# Patient Record
Sex: Female | Born: 1950
Health system: Southern US, Community
[De-identification: ages and names within clinical notes are randomized; demographics above are authoritative.]

## PROBLEM LIST (undated history)

## (undated) DIAGNOSIS — M2550 Pain in unspecified joint: Secondary | ICD-10-CM

## (undated) DIAGNOSIS — R7303 Prediabetes: Secondary | ICD-10-CM

## (undated) DIAGNOSIS — F32A Depression, unspecified: Secondary | ICD-10-CM

## (undated) DIAGNOSIS — M48061 Spinal stenosis, lumbar region without neurogenic claudication: Secondary | ICD-10-CM

## (undated) DIAGNOSIS — F329 Major depressive disorder, single episode, unspecified: Secondary | ICD-10-CM

## (undated) DIAGNOSIS — C8590 Non-Hodgkin lymphoma, unspecified, unspecified site: Secondary | ICD-10-CM

## (undated) DIAGNOSIS — F419 Anxiety disorder, unspecified: Secondary | ICD-10-CM

## (undated) DIAGNOSIS — R51 Headache: Secondary | ICD-10-CM

## (undated) DIAGNOSIS — Z87440 Personal history of urinary (tract) infections: Secondary | ICD-10-CM

## (undated) DIAGNOSIS — K219 Gastro-esophageal reflux disease without esophagitis: Secondary | ICD-10-CM

## (undated) DIAGNOSIS — J189 Pneumonia, unspecified organism: Secondary | ICD-10-CM

## (undated) DIAGNOSIS — K8020 Calculus of gallbladder without cholecystitis without obstruction: Secondary | ICD-10-CM

## (undated) DIAGNOSIS — R519 Headache, unspecified: Secondary | ICD-10-CM

## (undated) DIAGNOSIS — C801 Malignant (primary) neoplasm, unspecified: Secondary | ICD-10-CM

## (undated) DIAGNOSIS — C50912 Malignant neoplasm of unspecified site of left female breast: Principal | ICD-10-CM

## (undated) DIAGNOSIS — C50911 Malignant neoplasm of unspecified site of right female breast: Secondary | ICD-10-CM

## (undated) DIAGNOSIS — C210 Malignant neoplasm of anus, unspecified: Secondary | ICD-10-CM

## (undated) DIAGNOSIS — M1990 Unspecified osteoarthritis, unspecified site: Secondary | ICD-10-CM

## (undated) DIAGNOSIS — G629 Polyneuropathy, unspecified: Secondary | ICD-10-CM

## (undated) DIAGNOSIS — C50919 Malignant neoplasm of unspecified site of unspecified female breast: Secondary | ICD-10-CM

## (undated) DIAGNOSIS — E785 Hyperlipidemia, unspecified: Secondary | ICD-10-CM

## (undated) DIAGNOSIS — M2540 Effusion, unspecified joint: Secondary | ICD-10-CM

## (undated) DIAGNOSIS — I10 Essential (primary) hypertension: Secondary | ICD-10-CM

## (undated) DIAGNOSIS — Z8719 Personal history of other diseases of the digestive system: Secondary | ICD-10-CM

## (undated) DIAGNOSIS — Z8669 Personal history of other diseases of the nervous system and sense organs: Secondary | ICD-10-CM

## (undated) HISTORY — DX: Malignant neoplasm of unspecified site of left female breast: C50.912

## (undated) HISTORY — PX: OTHER SURGICAL HISTORY: SHX169

## (undated) HISTORY — DX: Polyneuropathy, unspecified: G62.9

## (undated) HISTORY — PX: HERNIA REPAIR: SHX51

## (undated) HISTORY — PX: TONSILLECTOMY: SUR1361

## (undated) HISTORY — PX: COLONOSCOPY: SHX174

## (undated) HISTORY — PX: ABDOMINAL HYSTERECTOMY: SHX81

## (undated) HISTORY — PX: CHOLECYSTECTOMY: SHX55

## (undated) HISTORY — DX: Malignant neoplasm of unspecified site of right female breast: C50.911

---

## 2012-06-29 ENCOUNTER — Encounter (HOSPITAL_COMMUNITY): Payer: Self-pay | Admitting: Emergency Medicine

## 2012-06-29 ENCOUNTER — Emergency Department (HOSPITAL_COMMUNITY)
Admission: EM | Admit: 2012-06-29 | Discharge: 2012-06-30 | Disposition: A | Discharge status: Home / Self Care | Payer: No Typology Code available for payment source | Attending: Emergency Medicine | Admitting: Emergency Medicine

## 2012-06-29 ENCOUNTER — Emergency Department (HOSPITAL_COMMUNITY): Payer: No Typology Code available for payment source

## 2012-06-29 DIAGNOSIS — Z79899 Other long term (current) drug therapy: Secondary | ICD-10-CM | POA: Insufficient documentation

## 2012-06-29 DIAGNOSIS — M479 Spondylosis, unspecified: Secondary | ICD-10-CM | POA: Insufficient documentation

## 2012-06-29 DIAGNOSIS — I10 Essential (primary) hypertension: Secondary | ICD-10-CM | POA: Insufficient documentation

## 2012-06-29 DIAGNOSIS — M25559 Pain in unspecified hip: Secondary | ICD-10-CM | POA: Insufficient documentation

## 2012-06-29 DIAGNOSIS — M549 Dorsalgia, unspecified: Secondary | ICD-10-CM

## 2012-06-29 HISTORY — DX: Essential (primary) hypertension: I10

## 2012-06-29 HISTORY — DX: Malignant (primary) neoplasm, unspecified: C80.1

## 2012-06-29 HISTORY — DX: Malignant neoplasm of unspecified site of unspecified female breast: C50.919

## 2012-06-29 HISTORY — DX: Spinal stenosis, lumbar region without neurogenic claudication: M48.061

## 2012-06-29 MED ORDER — METHOCARBAMOL 500 MG PO TABS
500.0000 mg | ORAL_TABLET | Freq: Once | ORAL | Status: AC
  Administered 2012-06-29: 500 mg via ORAL
  Filled 2012-06-29: qty 1

## 2012-06-29 MED ORDER — HYDROMORPHONE HCL PF 2 MG/ML IJ SOLN: 2.0000 mg | Freq: Once | INTRAMUSCULAR | Status: DC

## 2012-06-29 MED ORDER — HYDROMORPHONE HCL PF 2 MG/ML IJ SOLN
2.0000 mg | Freq: Once | INTRAMUSCULAR | Status: AC
  Administered 2012-06-29: 2 mg via INTRAMUSCULAR
  Filled 2012-06-29: qty 1

## 2012-06-29 MED ORDER — ONDANSETRON HCL 4 MG/2ML IJ SOLN: 4.0000 mg | Freq: Once | INTRAMUSCULAR | Status: DC

## 2012-06-29 MED ORDER — IBUPROFEN 200 MG PO TABS
600.0000 mg | ORAL_TABLET | Freq: Once | ORAL | Status: AC
  Administered 2012-06-29: 600 mg via ORAL
  Filled 2012-06-29: qty 3

## 2012-06-29 MED ORDER — RANITIDINE HCL 150 MG/10ML PO SYRP
150.0000 mg | ORAL_SOLUTION | Freq: Once | ORAL | Status: AC
  Administered 2012-06-29: 150 mg via ORAL
  Filled 2012-06-29: qty 10

## 2012-06-29 NOTE — ED Notes (Signed)
Pt reports lower back pain that radiates to pelvis and bilat LE, pt admits to hx bulging disc - pt states she has been taking medications at home w/o relief and pain has gotten progressively worse x1 week - pt visiting family and is from [place] so has been unable to see her PCP for this. Pt A&Ox4 in no acute distress, pleasant and cooperative.

## 2012-06-29 NOTE — ED Notes (Signed)
Pt states she is having pain in her lower back for the past couple of months  Pt states she has been treating herself at home with ibuprofen, aleve, and meloxicam everyday alternating the medications  Pt states it is not helping  Pt states she now has tingling in her legs when she lays down  Pt states it hurts to lay on her back or her side, to stand or sit for any length of time  Pt states she has pain in her groin area bilaterally

## 2012-06-30 MED ORDER — RANITIDINE HCL 150 MG PO CAPS: 150.0000 mg | ORAL_CAPSULE | Freq: Every day | ORAL | Status: DC

## 2012-06-30 MED ORDER — DIPHENHYDRAMINE HCL 25 MG PO CAPS
25.0000 mg | ORAL_CAPSULE | Freq: Once | ORAL | Status: AC
  Administered 2012-06-30: 25 mg via ORAL

## 2012-06-30 MED ORDER — DIPHENHYDRAMINE HCL 25 MG PO CAPS
ORAL_CAPSULE | ORAL | Status: AC
  Administered 2012-06-30: 25 mg via ORAL
  Filled 2012-06-30: qty 2

## 2012-06-30 MED ORDER — OXYCODONE-ACETAMINOPHEN 5-325 MG PO TABS: 1.0000 | ORAL_TABLET | Freq: Four times a day (QID) | ORAL | Status: DC | PRN

## 2012-06-30 MED ORDER — IBUPROFEN 600 MG PO TABS: 600.0000 mg | ORAL_TABLET | Freq: Four times a day (QID) | ORAL | Status: DC | PRN

## 2012-06-30 MED ORDER — METHOCARBAMOL 500 MG PO TABS: 500.0000 mg | ORAL_TABLET | Freq: Two times a day (BID) | ORAL | Status: DC

## 2012-06-30 MED ORDER — DIPHENHYDRAMINE HCL 25 MG PO CAPS: 25.0000 mg | ORAL_CAPSULE | Freq: Four times a day (QID) | ORAL | Status: DC | PRN

## 2012-06-30 NOTE — ED Notes (Signed)
Pt c/o generalized urticaria - Dr. Rhunette Croft made aware, orders given for PO benadryl.

## 2012-06-30 NOTE — ED Notes (Signed)
Rx given x5 D/c instructions reviewed w/ pt and family - pt and family deny any further questions or concerns at present. Pt assisted to d/c via wheelchair - pt A&Ox4 in no acute distress

## 2012-06-30 NOTE — ED Provider Notes (Signed)
History     CSN: [id number]  Arrival date & time 06/29/12  2009   First MD Initiated Contact with Patient 06/29/12 2130      Chief Complaint  Patient presents with  . Back Pain    (Consider location/radiation/quality/duration/timing/severity/associated sxs/prior treatment) HPI Comments: Pt comes in with cc of back pain. She has hx of chronic back pain, which is gradually worsening over the past 2 months. The pain is located in the lumbar region, and is radiating down her hips. She has associated numbness in the legs as well.  Pt has no associated numbness, weakness, urinary incontinence, urinary retention, bowel incontinence, saddle anesthesia.   Patient is a 61 y.o. female presenting with back pain. The history is provided by the patient.  Back Pain  Pertinent negatives include no chest pain, no headaches, no abdominal pain and no dysuria.    Past Medical History  Diagnosis Date  . Cancer   . Breast cancer   . Hypertension   . Lumbar stenosis     Past Surgical History  Procedure Date  . Double mastectomy    . Hernia repair   . Tonsillectomy   . Abdominal hysterectomy     Family History  Problem Relation Age of Onset  . Adopted: Yes    History  Substance Use Topics  . Smoking status: Never Smoker   . Smokeless tobacco: Not on file  . Alcohol Use: No    OB History    Grav Para Term Preterm Abortions TAB SAB Ect Mult Living                  Review of Systems  Constitutional: Positive for activity change.  HENT: Negative for neck pain.   Respiratory: Negative for shortness of breath.   Cardiovascular: Negative for chest pain.  Gastrointestinal: Negative for nausea, vomiting and abdominal pain.  Genitourinary: Negative for dysuria.  Musculoskeletal: Positive for back pain and gait problem.  Neurological: Negative for headaches.    Allergies  Vicodin  Home Medications   Current Outpatient Rx  Name Route Sig Dispense Refill  . IBUPROFEN 200 MG  PO TABS Oral Take 800 mg by mouth every 8 (eight) hours as needed. Pain    . LOSARTAN POTASSIUM 25 MG PO TABS Oral Take 12.5 mg by mouth daily.    . MELOXICAM 7.5 MG PO TABS Oral Take 7.5 mg by mouth daily.    [name] Kitchen NAPROXEN SODIUM 220 MG PO TABS Oral Take 880 mg by mouth 2 (two) times daily with a meal.    . TAMOXIFEN CITRATE 10 MG PO TABS Oral Take 10 mg by mouth daily.    [name] Kitchen DIPHENHYDRAMINE HCL 25 MG PO CAPS Oral Take 1 capsule (25 mg total) by mouth every 6 (six) hours as needed for itching. 30 capsule 0  . IBUPROFEN 600 MG PO TABS Oral Take 1 tablet (600 mg total) by mouth every 6 (six) hours as needed for pain. 30 tablet 0  . METHOCARBAMOL 500 MG PO TABS Oral Take 1 tablet (500 mg total) by mouth 2 (two) times daily. 20 tablet 0  . OXYCODONE-ACETAMINOPHEN 5-325 MG PO TABS Oral Take 1 tablet by mouth every 6 (six) hours as needed for pain. 20 tablet 0  . RANITIDINE HCL 150 MG PO CAPS Oral Take 1 capsule (150 mg total) by mouth daily. 30 capsule 0    BP 132/96  Pulse 58  Temp 98.2 F (36.8 C) (Oral)  Resp 20  SpO2 94%  Physical Exam  Nursing note and vitals reviewed. Constitutional: She is oriented to person, place, and time. She appears well-developed and well-nourished.  HENT:  Head: Normocephalic and atraumatic.  Eyes: EOM are normal. Pupils are equal, round, and reactive to light.  Neck: Neck supple.  Cardiovascular: Normal rate, regular rhythm and normal heart sounds.   No murmur heard. Pulmonary/Chest: Effort normal. No respiratory distress.  Abdominal: Soft. She exhibits no distension. There is no tenderness. There is no rebound and no guarding.  Musculoskeletal:       Reproducible tenderness with palpation of the lumbar spine and the surrounding muscles.  Neurological: She is alert and oriented to person, place, and time.  Skin: Skin is warm and dry.    ED Course  Procedures (including critical care time)  Labs Reviewed - No data to display Dg Lumbar Spine  Complete  06/29/2012  *RADIOLOGY REPORT*  Clinical Data: Low back pain for 2 months.  Radiating into lower extremities.  No injury.  LUMBAR SPINE - COMPLETE 4+ VIEW  Comparison: None.  Findings:   Mild convex right lumbar spine curvature. Cholecystectomy clips. Five lumbar type vertebral bodies. Sacroiliac joints are symmetric.  Left greater than right hip osteoarthritis.  Moderate to severe on the left.  Maintenance of vertebral body height.  Mild degenerative disc disease at the lumbosacral junction.  IMPRESSION: Spinal curvature, with degenerative disc disease at the lumbosacral junction.  Moderate to severe left and moderate right hip osteoarthritis.   Original Report Authenticated By: Consuello Bossier, M.D.      1. Back pain   2. Degenerative joint disease of low back       MDM  DDx includes: - DJD of the back - Spondylitises/ spondylosis - Sciatica - Spinal cord compression - Conus medullaris - Epidural hematoma - Epidural abscess - Lytic/pathologic fracture - Myelitis - Musculoskeletal pain  Pt comes in with cc of back pain. Has hx of chronic back pain. She is visiting her sister, and been here for 3 months. Over the past few days the pain has gradually increased, now to the point where ti is affecting her routine. No redflags for spinal cord involvement, infection, lytic lesions. Will control sx, and get back films.   12:21 AM Pain improved drastically with combination of meds here. Will d/c soon.  Derwood Kaplan, MD 06/30/12 0021

## 2013-01-25 ENCOUNTER — Other Ambulatory Visit: Payer: Self-pay | Admitting: Family Medicine

## 2013-01-25 DIAGNOSIS — N644 Mastodynia: Secondary | ICD-10-CM

## 2013-02-02 ENCOUNTER — Telehealth: Payer: Self-pay | Admitting: *Deleted

## 2013-02-02 NOTE — Telephone Encounter (Signed)
Left message for pt to return my call so I can schedule her w/ a med onc. 

## 2013-02-04 ENCOUNTER — Other Ambulatory Visit: Payer: Self-pay | Admitting: Orthopedic Surgery

## 2013-02-05 ENCOUNTER — Encounter (HOSPITAL_COMMUNITY): Payer: Self-pay | Admitting: Pharmacy Technician

## 2013-02-10 NOTE — H&P (Signed)
TOTAL HIP ADMISSION H&P  Patient is admitted for left total hip arthroplasty.  Subjective:  Chief Complaint: left hip pain  HPI: Cynthia Velasquez, 62 y.o. female, has a history of pain and functional disability in the left hip(s) due to avascular necrosis and patient has failed non-surgical conservative treatments for greater than 12 weeks to include NSAID's and/or analgesics, corticosteriod injections and use of assistive devices.  Onset of symptoms was abrupt starting 1 years ago with rapidlly worsening course since that time.The patient noted no past surgery on the left hip(s).  Patient currently rates pain in the left hip at 9 out of 10 with activity. Patient has night pain and worsening of pain with activity and weight bearing. Patient has evidence of subchondral cysts, subchondral sclerosis and partial collapse of the femoral head by imaging studies. This condition presents safety issues increasing the risk of falls. This patient has had avascular necrosis of the hip, acetabular fracture, hip dysplasia.  There is no current active infection.  There are no active problems to display for this patient.  Past Medical History  Diagnosis Date  . Cancer   . Breast cancer   . Hypertension   . Lumbar stenosis     Past Surgical History  Procedure Laterality Date  . Double mastectomy     . Hernia repair    . Tonsillectomy    . Abdominal hysterectomy      No prescriptions prior to admission   Allergies  Allergen Reactions  . Codeine Itching and Rash  . Vicodin (Hydrocodone-Acetaminophen) Rash    History  Substance Use Topics  . Smoking status: Never Smoker   . Smokeless tobacco: Not on file  . Alcohol Use: No    Family History  Problem Relation Age of Onset  . Adopted: Yes     Review of Systems  Constitutional: Negative.   HENT: Negative.   Eyes: Negative.   Respiratory: Negative.   Cardiovascular: Negative.   Gastrointestinal: Negative.   Genitourinary: Negative.    Musculoskeletal: Positive for back pain and joint pain.  Skin: Negative.   Neurological: Negative.   Endo/Heme/Allergies: Negative.   Psychiatric/Behavioral: Negative.     Objective:  Physical Exam  Constitutional: She is oriented to person, place, and time. She appears well-developed and well-nourished.  HENT:  Head: Normocephalic.  Eyes: Pupils are equal, round, and reactive to light.  Cardiovascular: Intact distal pulses.   Respiratory: Effort normal.  Musculoskeletal:       Left hip: She exhibits decreased range of motion, tenderness and crepitus.  Neurological: She is alert and oriented to person, place, and time.  Psychiatric: She has a normal mood and affect.    Vital signs in last 24 hours:    Labs:   There is no height or weight on file to calculate BMI.   Imaging Review Plain radiographs demonstrate severe degenerative joint disease of the left hip(s). The bone quality appears to be adequate for age and reported activity level.  Assessment/Plan:  End stage arthritis, left hip(s)  The patient history, physical examination, clinical judgement of the provider and imaging studies are consistent with end stage degenerative joint disease of the left hip(s) and total hip arthroplasty is deemed medically necessary. The treatment options including medical management, injection therapy, arthroscopy and arthroplasty were discussed at length. The risks and benefits of total hip arthroplasty were presented and reviewed. The risks due to aseptic loosening, infection, stiffness, dislocation/subluxation,  thromboembolic complications and other imponderables were discussed.  The patient acknowledged  the explanation, agreed to proceed with the plan and consent was signed. Patient is being admitted for inpatient treatment for surgery, pain control, PT, OT, prophylactic antibiotics, VTE prophylaxis, progressive ambulation and ADL's and discharge planning.The patient is planning to be  discharged home with home health services

## 2013-02-11 ENCOUNTER — Ambulatory Visit (HOSPITAL_COMMUNITY)
Admission: RE | Admit: 2013-02-11 | Discharge: 2013-02-11 | Disposition: A | Discharge status: Home / Self Care | Payer: BC Managed Care – PPO | Source: Ambulatory Visit | Attending: Orthopedic Surgery | Admitting: Orthopedic Surgery

## 2013-02-11 ENCOUNTER — Encounter (HOSPITAL_COMMUNITY)
Admission: RE | Admit: 2013-02-11 | Discharge: 2013-02-11 | Disposition: A | Discharge status: Home / Self Care | Payer: BC Managed Care – PPO | Source: Ambulatory Visit | Attending: Orthopedic Surgery | Admitting: Orthopedic Surgery

## 2013-02-11 ENCOUNTER — Encounter (HOSPITAL_COMMUNITY): Payer: Self-pay

## 2013-02-11 HISTORY — DX: Gastro-esophageal reflux disease without esophagitis: K21.9

## 2013-02-11 HISTORY — DX: Personal history of other diseases of the nervous system and sense organs: Z86.69

## 2013-02-11 HISTORY — DX: Hyperlipidemia, unspecified: E78.5

## 2013-02-11 HISTORY — DX: Unspecified osteoarthritis, unspecified site: M19.90

## 2013-02-11 HISTORY — DX: Pain in unspecified joint: M25.50

## 2013-02-11 HISTORY — DX: Personal history of urinary (tract) infections: Z87.440

## 2013-02-11 HISTORY — DX: Effusion, unspecified joint: M25.40

## 2013-02-11 LAB — CBC WITH DIFFERENTIAL/PLATELET
Basophils Absolute: 0 10*3/uL (ref 0.0–0.1)
Basophils Relative: 0 % (ref 0–1)
Eosinophils Absolute: 0.1 10*3/uL (ref 0.0–0.7)
Eosinophils Relative: 2 % (ref 0–5)
MCH: 26.5 pg (ref 26.0–34.0)
MCHC: 32.7 g/dL (ref 30.0–36.0)
MCV: 81.1 fL (ref 78.0–100.0)
Platelets: 245 10*3/uL (ref 150–400)
RDW: 15.3 % (ref 11.5–15.5)
WBC: 7.5 10*3/uL (ref 4.0–10.5)

## 2013-02-11 LAB — URINE MICROSCOPIC-ADD ON

## 2013-02-11 LAB — BASIC METABOLIC PANEL
BUN: 15 mg/dL (ref 6–23)
CO2: 26 mEq/L (ref 19–32)
Calcium: 9.8 mg/dL (ref 8.4–10.5)
Creatinine, Ser: 0.83 mg/dL (ref 0.50–1.10)
Glucose, Bld: 96 mg/dL (ref 70–99)

## 2013-02-11 LAB — URINALYSIS, ROUTINE W REFLEX MICROSCOPIC
Bilirubin Urine: NEGATIVE
Nitrite: POSITIVE — AB
Specific Gravity, Urine: 1.024 (ref 1.005–1.030)
pH: 6 (ref 5.0–8.0)

## 2013-02-11 LAB — PROTIME-INR: Prothrombin Time: 13.4 seconds (ref 11.6–15.2)

## 2013-02-11 LAB — APTT: aPTT: 34 seconds (ref 24–37)

## 2013-02-11 LAB — ABO/RH: ABO/RH(D): O POS

## 2013-02-11 MED ORDER — CHLORHEXIDINE GLUCONATE 4 % EX LIQD: 60.0000 mL | Freq: Once | CUTANEOUS | Status: DC

## 2013-02-11 MED ORDER — CEFAZOLIN SODIUM-DEXTROSE 2-3 GM-% IV SOLR
2.0000 g | INTRAVENOUS | Status: AC
Start: 2013-02-12 — End: 2013-02-12
  Administered 2013-02-12: 2 g via INTRAVENOUS
  Filled 2013-02-11: qty 50

## 2013-02-11 NOTE — Pre-Procedure Instructions (Signed)
Cynthia Velasquez  02/11/2013   Your procedure is scheduled on:  Fri, May 30 @ 11:55 AM  Report to [facility] at 9:45 AM   Call this number if you have problems the morning of surgery: [phone]   Remember:   Do not eat food or drink liquids after midnight.   Take these medicines the morning of surgery with A SIP OF WATER: Tamoxifen(Nolvadex)              No Aspirin,Goody's,BC's,Aleve,Ibuprofen,Fish Oil,or any Herbal Medications   Do not wear jewelry, make-up or nail polish.  Do not wear lotions, powders, or perfumes. You may wear deodorant.  Do not shave 48 hours prior to surgery.   Do not bring valuables to the hospital.  Contacts, dentures or bridgework may not be worn into surgery.  Leave suitcase in the car. After surgery it may be brought to your room.  For patients admitted to the hospital, checkout time is 11:00 AM the day of  discharge.   Patients discharged the day of surgery will not be allowed to drive  home.    Special Instructions: Shower using CHG 2 nights before surgery and the night before surgery.  If you shower the day of surgery use CHG.  Use special wash - you have one bottle of CHG for all showers.  You should use approximately 1/3 of the bottle for each shower.   Please read over the following fact sheets that you were given: Pain Booklet, Coughing and Deep Breathing, Blood Transfusion Information, Total Joint Packet, MRSA Information and Surgical Site Infection Prevention

## 2013-02-11 NOTE — Progress Notes (Signed)
Labs and PCR still pending at 1915;  please review  On DOS. Also, please review EKG on DOS.

## 2013-02-11 NOTE — Progress Notes (Signed)
Pt doesn't have a cardiologist  Stress test done 20+yrs ago and was done d/t stressful job  Denies ever having an echo or heart cath  [employer] with Deboraha Sprang  Denies ekg or cxr in past yr

## 2013-02-12 ENCOUNTER — Ambulatory Visit (HOSPITAL_COMMUNITY): Payer: BC Managed Care – PPO | Admitting: Anesthesiology

## 2013-02-12 ENCOUNTER — Encounter (HOSPITAL_COMMUNITY): Payer: Self-pay | Admitting: Anesthesiology

## 2013-02-12 ENCOUNTER — Encounter (HOSPITAL_COMMUNITY)
Admission: RE | Disposition: A | Discharge status: Home / Self Care | Payer: Self-pay | Source: Ambulatory Visit | Attending: Orthopedic Surgery

## 2013-02-12 ENCOUNTER — Inpatient Hospital Stay (HOSPITAL_COMMUNITY)
Admission: RE | Admit: 2013-02-12 | Discharge: 2013-02-15 | DRG: 818 | Disposition: A | Discharge status: Home / Self Care | Payer: BC Managed Care – PPO | Source: Ambulatory Visit | Attending: Orthopedic Surgery | Admitting: Orthopedic Surgery

## 2013-02-12 ENCOUNTER — Inpatient Hospital Stay (HOSPITAL_COMMUNITY): Payer: BC Managed Care – PPO

## 2013-02-12 DIAGNOSIS — M1610 Unilateral primary osteoarthritis, unspecified hip: Principal | ICD-10-CM | POA: Diagnosis present

## 2013-02-12 DIAGNOSIS — E785 Hyperlipidemia, unspecified: Secondary | ICD-10-CM | POA: Diagnosis present

## 2013-02-12 DIAGNOSIS — M1612 Unilateral primary osteoarthritis, left hip: Secondary | ICD-10-CM

## 2013-02-12 DIAGNOSIS — Z87440 Personal history of urinary (tract) infections: Secondary | ICD-10-CM

## 2013-02-12 DIAGNOSIS — K219 Gastro-esophageal reflux disease without esophagitis: Secondary | ICD-10-CM | POA: Diagnosis present

## 2013-02-12 DIAGNOSIS — Z7982 Long term (current) use of aspirin: Secondary | ICD-10-CM

## 2013-02-12 DIAGNOSIS — M169 Osteoarthritis of hip, unspecified: Principal | ICD-10-CM | POA: Diagnosis present

## 2013-02-12 DIAGNOSIS — Z853 Personal history of malignant neoplasm of breast: Secondary | ICD-10-CM

## 2013-02-12 DIAGNOSIS — Z9010 Acquired absence of unspecified breast and nipple: Secondary | ICD-10-CM

## 2013-02-12 DIAGNOSIS — I10 Essential (primary) hypertension: Secondary | ICD-10-CM | POA: Diagnosis present

## 2013-02-12 HISTORY — PX: TOTAL HIP ARTHROPLASTY: SHX124

## 2013-02-12 SURGERY — ARTHROPLASTY, HIP, TOTAL,POSTERIOR APPROACH
Anesthesia: General | Site: Hip | Laterality: Left | Wound class: Clean

## 2013-02-12 MED ORDER — DIPHENHYDRAMINE HCL 12.5 MG/5ML PO ELIX: 12.5000 mg | ORAL_SOLUTION | ORAL | Status: DC | PRN

## 2013-02-12 MED ORDER — MIDAZOLAM HCL 5 MG/5ML IJ SOLN
INTRAMUSCULAR | Status: DC | PRN
  Administered 2013-02-12 (×2): 1 mg via INTRAVENOUS

## 2013-02-12 MED ORDER — HYDROMORPHONE HCL PF 1 MG/ML IJ SOLN
INTRAMUSCULAR | Status: AC
  Filled 2013-02-12: qty 2

## 2013-02-12 MED ORDER — ALUM & MAG HYDROXIDE-SIMETH 200-200-20 MG/5ML PO SUSP: 30.0000 mL | ORAL | Status: DC | PRN

## 2013-02-12 MED ORDER — CELECOXIB 200 MG PO CAPS
200.0000 mg | ORAL_CAPSULE | Freq: Two times a day (BID) | ORAL | Status: DC
  Administered 2013-02-12 – 2013-02-15 (×5): 200 mg via ORAL
  Filled 2013-02-12 (×8): qty 1

## 2013-02-12 MED ORDER — METHOCARBAMOL 500 MG PO TABS
500.0000 mg | ORAL_TABLET | Freq: Four times a day (QID) | ORAL | Status: DC | PRN
  Administered 2013-02-12 – 2013-02-15 (×7): 500 mg via ORAL
  Filled 2013-02-12 (×7): qty 1

## 2013-02-12 MED ORDER — HYDROMORPHONE HCL PF 1 MG/ML IJ SOLN
1.0000 mg | INTRAMUSCULAR | Status: DC | PRN
  Administered 2013-02-12 – 2013-02-14 (×2): 1 mg via INTRAVENOUS
  Filled 2013-02-12 (×2): qty 1

## 2013-02-12 MED ORDER — GLYCOPYRROLATE 0.2 MG/ML IJ SOLN
INTRAMUSCULAR | Status: DC | PRN
  Administered 2013-02-12: .8 mg via INTRAVENOUS

## 2013-02-12 MED ORDER — SIMVASTATIN 20 MG PO TABS
20.0000 mg | ORAL_TABLET | Freq: Every day | ORAL | Status: DC
  Administered 2013-02-12 – 2013-02-15 (×4): 20 mg via ORAL
  Filled 2013-02-12 (×4): qty 1

## 2013-02-12 MED ORDER — MENTHOL 3 MG MT LOZG: 1.0000 | LOZENGE | OROMUCOSAL | Status: DC | PRN

## 2013-02-12 MED ORDER — DIPHENHYDRAMINE HCL 25 MG PO TABS
25.0000 mg | ORAL_TABLET | Freq: Every day | ORAL | Status: DC | PRN
  Filled 2013-02-12: qty 1

## 2013-02-12 MED ORDER — PHENYLEPHRINE HCL 10 MG/ML IJ SOLN
INTRAMUSCULAR | Status: DC | PRN
  Administered 2013-02-12 (×2): 80 ug via INTRAVENOUS

## 2013-02-12 MED ORDER — ONDANSETRON HCL 4 MG/2ML IJ SOLN
4.0000 mg | Freq: Four times a day (QID) | INTRAMUSCULAR | Status: DC | PRN
  Filled 2013-02-12: qty 2

## 2013-02-12 MED ORDER — PROPOFOL 10 MG/ML IV BOLUS
INTRAVENOUS | Status: DC | PRN
  Administered 2013-02-12: 150 mg via INTRAVENOUS
  Administered 2013-02-12: 50 mg via INTRAVENOUS

## 2013-02-12 MED ORDER — ARTIFICIAL TEARS OP OINT
TOPICAL_OINTMENT | OPHTHALMIC | Status: DC | PRN
  Administered 2013-02-12: 1 via OPHTHALMIC

## 2013-02-12 MED ORDER — ONDANSETRON HCL 4 MG/2ML IJ SOLN
INTRAMUSCULAR | Status: DC | PRN
  Administered 2013-02-12: 4 mg via INTRAVENOUS

## 2013-02-12 MED ORDER — ROCURONIUM BROMIDE 100 MG/10ML IV SOLN
INTRAVENOUS | Status: DC | PRN
  Administered 2013-02-12: 10 mg via INTRAVENOUS
  Administered 2013-02-12: 50 mg via INTRAVENOUS

## 2013-02-12 MED ORDER — DEXTROSE 5 % IV SOLN
500.0000 mg | Freq: Four times a day (QID) | INTRAVENOUS | Status: DC | PRN
  Filled 2013-02-12: qty 5

## 2013-02-12 MED ORDER — DEXTROSE-NACL 5-0.45 % IV SOLN: INTRAVENOUS | Status: DC

## 2013-02-12 MED ORDER — TAMOXIFEN CITRATE 10 MG PO TABS
20.0000 mg | ORAL_TABLET | Freq: Every day | ORAL | Status: DC
  Administered 2013-02-12 – 2013-02-15 (×4): 20 mg via ORAL
  Filled 2013-02-12 (×4): qty 2

## 2013-02-12 MED ORDER — FENTANYL CITRATE 0.05 MG/ML IJ SOLN
INTRAMUSCULAR | Status: DC | PRN
  Administered 2013-02-12 (×8): 50 ug via INTRAVENOUS
  Administered 2013-02-12: 100 ug via INTRAVENOUS

## 2013-02-12 MED ORDER — HYDROMORPHONE HCL PF 1 MG/ML IJ SOLN
0.2500 mg | INTRAMUSCULAR | Status: DC | PRN
  Administered 2013-02-12 (×4): 0.5 mg via INTRAVENOUS

## 2013-02-12 MED ORDER — FLEET ENEMA 7-19 GM/118ML RE ENEM: 1.0000 | ENEMA | Freq: Once | RECTAL | Status: AC | PRN

## 2013-02-12 MED ORDER — MAGNESIUM HYDROXIDE 400 MG/5ML PO SUSP: 30.0000 mL | Freq: Every day | ORAL | Status: DC | PRN

## 2013-02-12 MED ORDER — BISACODYL 5 MG PO TBEC: 5.0000 mg | DELAYED_RELEASE_TABLET | Freq: Every day | ORAL | Status: DC | PRN

## 2013-02-12 MED ORDER — ESMOLOL HCL 10 MG/ML IV SOLN
INTRAVENOUS | Status: DC | PRN
  Administered 2013-02-12 (×2): 20 mg via INTRAVENOUS

## 2013-02-12 MED ORDER — ASPIRIN EC 325 MG PO TBEC
325.0000 mg | DELAYED_RELEASE_TABLET | Freq: Two times a day (BID) | ORAL | Status: DC
  Administered 2013-02-13 – 2013-02-15 (×5): 325 mg via ORAL
  Filled 2013-02-12 (×8): qty 1

## 2013-02-12 MED ORDER — OXYCODONE HCL 5 MG PO TABS
5.0000 mg | ORAL_TABLET | ORAL | Status: DC | PRN
  Administered 2013-02-12 – 2013-02-15 (×6): 10 mg via ORAL
  Filled 2013-02-12 (×6): qty 2

## 2013-02-12 MED ORDER — LACTATED RINGERS IV SOLN
INTRAVENOUS | Status: DC | PRN
  Administered 2013-02-12 (×2): via INTRAVENOUS

## 2013-02-12 MED ORDER — LOSARTAN POTASSIUM-HCTZ 50-12.5 MG PO TABS: 1.0000 | ORAL_TABLET | Freq: Every day | ORAL | Status: DC

## 2013-02-12 MED ORDER — BUPIVACAINE-EPINEPHRINE PF 0.5-1:200000 % IJ SOLN
INTRAMUSCULAR | Status: AC
  Filled 2013-02-12: qty 30

## 2013-02-12 MED ORDER — METOCLOPRAMIDE HCL 10 MG PO TABS: 5.0000 mg | ORAL_TABLET | Freq: Three times a day (TID) | ORAL | Status: DC | PRN

## 2013-02-12 MED ORDER — LOSARTAN POTASSIUM 50 MG PO TABS
50.0000 mg | ORAL_TABLET | Freq: Every day | ORAL | Status: DC
  Administered 2013-02-12 – 2013-02-13 (×2): 50 mg via ORAL
  Filled 2013-02-12 (×2): qty 1

## 2013-02-12 MED ORDER — LACTATED RINGERS IV SOLN
INTRAVENOUS | Status: DC
  Administered 2013-02-12: 11:00:00 via INTRAVENOUS

## 2013-02-12 MED ORDER — SODIUM CHLORIDE 0.9 % IR SOLN
Status: DC | PRN
  Administered 2013-02-12: 1000 mL

## 2013-02-12 MED ORDER — MUPIROCIN 2 % EX OINT
TOPICAL_OINTMENT | CUTANEOUS | Status: AC
  Filled 2013-02-12: qty 22

## 2013-02-12 MED ORDER — ACETAMINOPHEN 650 MG RE SUPP: 650.0000 mg | Freq: Four times a day (QID) | RECTAL | Status: DC | PRN

## 2013-02-12 MED ORDER — PROMETHAZINE HCL 25 MG/ML IJ SOLN: 6.2500 mg | INTRAMUSCULAR | Status: DC | PRN

## 2013-02-12 MED ORDER — NEOSTIGMINE METHYLSULFATE 1 MG/ML IJ SOLN
INTRAMUSCULAR | Status: DC | PRN
  Administered 2013-02-12: 5 mg via INTRAVENOUS

## 2013-02-12 MED ORDER — LIDOCAINE HCL (CARDIAC) 20 MG/ML IV SOLN
INTRAVENOUS | Status: DC | PRN
  Administered 2013-02-12: 80 mg via INTRAVENOUS

## 2013-02-12 MED ORDER — ACETAMINOPHEN 325 MG PO TABS: 650.0000 mg | ORAL_TABLET | Freq: Four times a day (QID) | ORAL | Status: DC | PRN

## 2013-02-12 MED ORDER — METOCLOPRAMIDE HCL 5 MG/ML IJ SOLN: 5.0000 mg | Freq: Three times a day (TID) | INTRAMUSCULAR | Status: DC | PRN

## 2013-02-12 MED ORDER — KCL IN DEXTROSE-NACL 20-5-0.45 MEQ/L-%-% IV SOLN
INTRAVENOUS | Status: DC
Start: 2013-02-12 — End: 2013-02-15
  Administered 2013-02-12 – 2013-02-13 (×2): via INTRAVENOUS
  Administered 2013-02-14: 125 mL/h via INTRAVENOUS
  Administered 2013-02-15: 01:00:00 via INTRAVENOUS
  Filled 2013-02-12 (×12): qty 1000

## 2013-02-12 MED ORDER — HYDROCHLOROTHIAZIDE 12.5 MG PO CAPS
12.5000 mg | ORAL_CAPSULE | Freq: Every day | ORAL | Status: DC
  Administered 2013-02-12 – 2013-02-13 (×2): 12.5 mg via ORAL
  Filled 2013-02-12 (×2): qty 1

## 2013-02-12 MED ORDER — BUPIVACAINE-EPINEPHRINE 0.5% -1:200000 IJ SOLN
INTRAMUSCULAR | Status: DC | PRN
  Administered 2013-02-12: 30 mL

## 2013-02-12 MED ORDER — ONDANSETRON HCL 4 MG PO TABS: 4.0000 mg | ORAL_TABLET | Freq: Four times a day (QID) | ORAL | Status: DC | PRN

## 2013-02-12 MED ORDER — PHENOL 1.4 % MT LIQD
1.0000 | OROMUCOSAL | Status: DC | PRN
  Filled 2013-02-12: qty 177

## 2013-02-12 SURGICAL SUPPLY — 53 items
BLADE SAW SAG 73X25 THK (BLADE) ×1
BLADE SAW SGTL 18X1.27X75 (BLADE) IMPLANT
BLADE SAW SGTL 73X25 THK (BLADE) ×1 IMPLANT
BLADE SAW SGTL MED 73X18.5 STR (BLADE) IMPLANT
BRUSH FEMORAL CANAL (MISCELLANEOUS) IMPLANT
CLOTH BEACON ORANGE TIMEOUT ST (SAFETY) ×2 IMPLANT
COVER BACK TABLE 24X17X13 BIG (DRAPES) IMPLANT
COVER SURGICAL LIGHT HANDLE (MISCELLANEOUS) ×4 IMPLANT
DRAPE ORTHO SPLIT 77X108 STRL (DRAPES) ×1
DRAPE PROXIMA HALF (DRAPES) ×2 IMPLANT
DRAPE SURG ORHT 6 SPLT 77X108 (DRAPES) ×1 IMPLANT
DRAPE U-SHAPE 47X51 STRL (DRAPES) ×2 IMPLANT
DRILL BIT 7/64X5 (BIT) ×2 IMPLANT
DRSG MEPILEX BORDER 4X12 (GAUZE/BANDAGES/DRESSINGS) ×2 IMPLANT
DRSG MEPILEX BORDER 4X8 (GAUZE/BANDAGES/DRESSINGS) ×2 IMPLANT
DURAPREP 26ML APPLICATOR (WOUND CARE) ×2 IMPLANT
ELECT BLADE 4.0 EZ CLEAN MEGAD (MISCELLANEOUS)
ELECT REM PT RETURN 9FT ADLT (ELECTROSURGICAL) ×2
ELECTRODE BLDE 4.0 EZ CLN MEGD (MISCELLANEOUS) IMPLANT
ELECTRODE REM PT RTRN 9FT ADLT (ELECTROSURGICAL) ×1 IMPLANT
GAUZE XEROFORM 1X8 LF (GAUZE/BANDAGES/DRESSINGS) ×2 IMPLANT
GLOVE BIO SURGEON STRL SZ7 (GLOVE) ×2 IMPLANT
GLOVE BIO SURGEON STRL SZ7.5 (GLOVE) ×2 IMPLANT
GLOVE BIOGEL PI IND STRL 7.0 (GLOVE) ×1 IMPLANT
GLOVE BIOGEL PI IND STRL 8 (GLOVE) ×1 IMPLANT
GLOVE BIOGEL PI INDICATOR 7.0 (GLOVE) ×1
GLOVE BIOGEL PI INDICATOR 8 (GLOVE) ×1
GOWN PREVENTION PLUS XLARGE (GOWN DISPOSABLE) ×2 IMPLANT
GOWN STRL NON-REIN LRG LVL3 (GOWN DISPOSABLE) ×4 IMPLANT
HANDPIECE INTERPULSE COAX TIP (DISPOSABLE)
HOOD PEEL AWAY FACE SHEILD DIS (HOOD) ×4 IMPLANT
KIT BASIN OR (CUSTOM PROCEDURE TRAY) ×2 IMPLANT
KIT ROOM TURNOVER OR (KITS) ×2 IMPLANT
MANIFOLD NEPTUNE II (INSTRUMENTS) ×2 IMPLANT
NEEDLE 22X1 1/2 (OR ONLY) (NEEDLE) ×2 IMPLANT
NS IRRIG 1000ML POUR BTL (IV SOLUTION) ×2 IMPLANT
PACK TOTAL JOINT (CUSTOM PROCEDURE TRAY) ×2 IMPLANT
PAD ARMBOARD 7.5X6 YLW CONV (MISCELLANEOUS) ×4 IMPLANT
PASSER SUT SWANSON 36MM LOOP (INSTRUMENTS) ×2 IMPLANT
PRESSURIZER FEMORAL UNIV (MISCELLANEOUS) IMPLANT
SET HNDPC FAN SPRY TIP SCT (DISPOSABLE) IMPLANT
SUT ETHIBOND 2 V 37 (SUTURE) ×2 IMPLANT
SUT ETHILON 3 0 FSL (SUTURE) ×2 IMPLANT
SUT VIC AB 0 CTB1 27 (SUTURE) ×2 IMPLANT
SUT VIC AB 1 CTX 36 (SUTURE) ×1
SUT VIC AB 1 CTX36XBRD ANBCTR (SUTURE) ×1 IMPLANT
SUT VIC AB 2-0 CTB1 (SUTURE) ×2 IMPLANT
SYR CONTROL 10ML LL (SYRINGE) ×2 IMPLANT
TOWEL OR 17X24 6PK STRL BLUE (TOWEL DISPOSABLE) ×2 IMPLANT
TOWEL OR 17X26 10 PK STRL BLUE (TOWEL DISPOSABLE) ×2 IMPLANT
TOWER CARTRIDGE SMART MIX (DISPOSABLE) IMPLANT
TRAY FOLEY CATH 14FR (SET/KITS/TRAYS/PACK) ×2 IMPLANT
WATER STERILE IRR 1000ML POUR (IV SOLUTION) ×8 IMPLANT

## 2013-02-12 NOTE — Preoperative (Signed)
Beta Blockers   Reason not to administer Beta Blockers: not prescribed 

## 2013-02-12 NOTE — Anesthesia Preprocedure Evaluation (Addendum)
Anesthesia Evaluation  Patient identified by MRN, date of birth, ID band Patient awake    Reviewed: Allergy & Precautions, H&P , NPO status , Patient's Chart, lab work & pertinent test results  History of Anesthesia Complications Negative for: history of anesthetic complications  Airway Mallampati: II TM Distance: >3 FB Neck ROM: Full    Dental  (+) Teeth Intact and Dental Advisory Given   Pulmonary neg pulmonary ROS,    Pulmonary exam normal       Cardiovascular hypertension, Pt. on medications     Neuro/Psych negative neurological ROS     GI/Hepatic Neg liver ROS, GERD-  Medicated,  Endo/Other  negative endocrine ROS  Renal/GU negative Renal ROS     Musculoskeletal negative musculoskeletal ROS (+)   Abdominal   Peds  Hematology negative hematology ROS (+)   Anesthesia Other Findings   Reproductive/Obstetrics                          Anesthesia Physical Anesthesia Plan  ASA: II  Anesthesia Plan: General   Post-op Pain Management:    Induction: Intravenous  Airway Management Planned: Oral ETT  Additional Equipment:   Intra-op Plan:   Post-operative Plan: Extubation in OR  Informed Consent: I have reviewed the patients History and Physical, chart, labs and discussed the procedure including the risks, benefits and alternatives for the proposed anesthesia with the patient or authorized representative who has indicated his/her understanding and acceptance.   Dental advisory given and Dental Advisory Given  Plan Discussed with: CRNA, Anesthesiologist and Surgeon  Anesthesia Plan Comments:       Anesthesia Quick Evaluation

## 2013-02-12 NOTE — Anesthesia Postprocedure Evaluation (Signed)
Anesthesia Post Note  [name]  Procedure(s) Performed: Procedure(s) (LRB): LEFT TOTAL HIP ARTHROPLASTY (Left)  Anesthesia type: general  Patient location: PACU  Post pain: Pain level controlled  Post assessment: Patient's Cardiovascular Status Stable  Last Vitals:  Filed Vitals:   02/12/13 1530  BP:   Pulse: 93  Temp:   Resp: 14    Post vital signs: Reviewed and stable  Level of consciousness: sedated  Complications: No apparent anesthesia complications

## 2013-02-12 NOTE — Anesthesia Procedure Notes (Signed)
Procedure Name: Intubation Date/Time: 02/12/2013 12:26 PM Performed by: Gayla Medicus Pre-anesthesia Checklist: Patient identified, Timeout performed, Emergency Drugs available, Suction available and Patient being monitored Patient Re-evaluated:Patient Re-evaluated prior to inductionOxygen Delivery Method: Circle system utilized Preoxygenation: Pre-oxygenation with 100% oxygen Intubation Type: IV induction Ventilation: Mask ventilation without difficulty and Oral airway inserted - appropriate to patient size Laryngoscope Size: Mac and 3 Grade View: Grade III Tube type: Oral Tube size: 7.0 mm Number of attempts: 1 Airway Equipment and Method: Stylet Placement Confirmation: ETT inserted through vocal cords under direct vision,  positive ETCO2 and breath sounds checked- equal and bilateral Secured at: 21 cm Tube secured with: Tape Dental Injury: Teeth and Oropharynx as per pre-operative assessment

## 2013-02-12 NOTE — Plan of Care (Signed)
Problem: Consults Goal: Diagnosis- Total Joint Replacement Primary Total Hip Left     

## 2013-02-12 NOTE — Interval H&P Note (Signed)
History and Physical Interval Note:  02/12/2013 12:08 PM  Cynthia Velasquez  has presented today for surgery, with the diagnosis of LEFT HIP OSTEOARTHRITIS  The various methods of treatment have been discussed with the patient and family. After consideration of risks, benefits and other options for treatment, the patient has consented to  Procedure(s): LEFT TOTAL HIP ARTHROPLASTY (Left) as a surgical intervention .  The patient's history has been reviewed, patient examined, no change in status, stable for surgery.  I have reviewed the patient's chart and labs.  Questions were answered to the patient's satisfaction.     Nestor Lewandowsky

## 2013-02-12 NOTE — Transfer of Care (Signed)
Immediate Anesthesia Transfer of Care Note  [name]  Procedure(s) Performed: Procedure(s): LEFT TOTAL HIP ARTHROPLASTY (Left)  Patient Location: PACU  Anesthesia Type:General  Level of Consciousness: awake, alert  and oriented  Airway & Oxygen Therapy: Patient Spontanous Breathing and Patient connected to nasal cannula oxygen  Post-op Assessment: Report given to PACU RN, Post -op Vital signs reviewed and stable and Patient moving all extremities X 4  Post vital signs: Reviewed and stable  Complications: No apparent anesthesia complications

## 2013-02-12 NOTE — Op Note (Signed)
OPERATIVE REPORT    DATE OF PROCEDURE:  02/12/2013       PREOPERATIVE DIAGNOSIS:  LEFT HIP OSTEOARTHRITIS                                                          POSTOPERATIVE DIAGNOSIS:  LEFT HIP OSTEOARTHRITIS                                                           PROCEDURE:  L total hip arthroplasty using a 52 mm DePuy Pinnacle  Cup, [employer], 10-degree polyethylene liner index superior  and posterior, a +0 36 mm ceramic head, a [phone] SROM stem, 20Dsm Sleeve   SURGEON: Amparo Donalson J    ASSISTANT:   Mauricia Area, PA-C  (present throughout entire procedure and necessary for timely completion of the procedure)   ANESTHESIA: General BLOOD LOSS: 500 FLUID REPLACEMENT: 1800 crystalloid DRAINS: Foley Catheter URINE OUTPUT: 300cc COMPLICATIONS: none    INDICATIONS FOR PROCEDURE: A 62 y.o. year-old With  LEFT HIP OSTEOARTHRITIS   for 3 years, x-rays show bone-on-bone arthritic changes. Despite conservative measures with observation, anti-inflammatory medicine, narcotics, use of a cane, has severe unremitting pain and can ambulate only a few blocks before resting.  Patient desires elective L total hip arthroplasty to decrease pain and increase function. The risks, benefits, and alternatives were discussed at length including but not limited to the risks of infection, bleeding, nerve injury, stiffness, blood clots, the need for revision surgery, cardiopulmonary complications, among others, and they were willing to proceed. Questions answered     PROCEDURE IN DETAIL: The patient was identified by armband,  received preoperative IV antibiotics in the holding area at [facility], taken to the operating room , appropriate anesthetic monitors  were attached and general endotracheal anesthesia induced. Foley catheter was inserted. Pt was rolled into the R lateral decubitus position and fixed there with a Stulberg Mark II pelvic clamp.  The L lower extremity was then  prepped and draped  in the usual sterile fashion from the ankle to the hemipelvis. A time-out  procedure was performed. The skin along the lateral hip and thigh  infiltrated with 10 mL of 0.5% Marcaine and epinephrine solution. We  then made a posterolateral approach to the hip. With a #10 blade, a 20 cm  incision was made through the skin and subcutaneous tissue down to the level of the  IT band. Small bleeders were identified and cauterized. The IT band was cut in  line with skin incision exposing the greater trochanter. A Cobra retractor was placed between the gluteus minimus and the superior hip joint capsule, and a spiked Cobra between the quadratus femoris and the inferior hip joint capsule. This isolated the short  external rotators and piriformis tendons. These were tagged with a #2 Ethibond  suture and cut off their insertion on the intertrochanteric crest. The posterior  capsule was then developed into an acetabular-based flap from Posterior Superior off of the acetabulum out over the femoral neck and back posterior inferior to the acetabular rim. This flap was tagged with two #2 Ethibond sutures  and retracted protecting the sciatic nerve. This exposed the arthritic femoral head and osteophytes. The hip was then flexed and internally rotated, dislocating the femoral head and a standard neck cut performed 1 fingerbreadth above the lesser trochanter.  A spiked Cobra was placed in the cotyloid notch and a Hohmann retractor was then used to lever the femur anteriorly off of the anterior pelvic column. A posterior-inferior wing retractor was placed at the junction of the acetabulum and the ischium completing the acetabular exposure.We then removed the peripheral osteophytes and labrum from the acetabulum. We then reamed the acetabulum up to 51 mm with basket reamers obtaining good coverage in all quadrants. We then irrigated with normal  saline solution and hammered into place a 52 mm pinnacle cup  in 45  degrees of abduction and about 20 degrees of anteversion. More  peripheral osteophytes removed and a trial 10-degree liner placed with the  index superior-posterior. The hip was then flexed and internally rotated exposing the  proximal femur, which was entered with the initiating reamer followed by  the axial reamers up to a 15.5 mm full depth and 16mm partial depth. We then conically reamed to 20D to the correct depth for a 42 base neck. The calcar was milled to 20Dsm. A trial cone and stem was inserted in the 25 degrees anteversion, with a +0 36mm trial head. Trial reduction was then performed and excellent stability was noted with at 90 of flexion with 75 of internal rotation and then full extension with maximal external rotation. The hip could not be dislocated in full extension. The knee could easily flex  to about 130 degrees. We also stretched the abductors at this point,  because of the preexisting adductor contractures. All trial components  were then removed. The acetabulum was irrigated out with normal saline  solution. A titanium [facility] was then screwed into place  followed by a 10-degree polyethylene liner index superior-posterior. On  the femoral side a 20Dsm ZTT1 sleeve was hammered into place, followed by a [phone] SROM stem in 25 degrees of anteversion. At this point, a +0 36 mm ceramic head was  hammered on the stem. The hip was reduced. We checked our stability  one more time and found it to be excellent. The wound was once again  thoroughly irrigated out with normal saline solution pulse lavage. The  capsular flap and short external rotators were repaired back to the  intertrochanteric crest through drill holes with a #2 Ethibond suture.  The IT band was closed with running 1 Vicryl suture. The subcutaneous  tissue with 0 and 2-0 undyed Vicryl suture and the skin with running  interlocking 3-0 nylon suture. Dressing of Xeroform and Mepilex was  then  applied. The patient was then unclamped, rolled supine, awaken extubated and taken to recovery room without difficulty in stable condition.   Haadi Santellan J 02/12/2013, 1:36 PM

## 2013-02-13 LAB — BASIC METABOLIC PANEL
BUN: 10 mg/dL (ref 6–23)
CO2: 26 mEq/L (ref 19–32)
Chloride: 99 mEq/L (ref 96–112)
Creatinine, Ser: 0.85 mg/dL (ref 0.50–1.10)
GFR calc Af Amer: 84 mL/min — ABNORMAL LOW (ref >90)

## 2013-02-13 LAB — URINE CULTURE

## 2013-02-13 LAB — CBC
HCT: 30.9 % — ABNORMAL LOW (ref 36.0–46.0)
MCH: 26.8 pg (ref 26.0–34.0)
MCV: 81.1 fL (ref 78.0–100.0)
RDW: 15.4 % (ref 11.5–15.5)
WBC: 7 10*3/uL (ref 4.0–10.5)

## 2013-02-13 MED ORDER — MUPIROCIN 2 % EX OINT
1.0000 "application " | TOPICAL_OINTMENT | Freq: Two times a day (BID) | CUTANEOUS | Status: DC
  Administered 2013-02-13 – 2013-02-15 (×5): 1 via NASAL
  Filled 2013-02-13: qty 22

## 2013-02-13 MED ORDER — CHLORHEXIDINE GLUCONATE CLOTH 2 % EX PADS
6.0000 | MEDICATED_PAD | Freq: Every day | CUTANEOUS | Status: DC
  Administered 2013-02-13 – 2013-02-15 (×3): 6 via TOPICAL

## 2013-02-13 MED ORDER — SODIUM CHLORIDE 0.9 % IV BOLUS (SEPSIS)
500.0000 mL | Freq: Once | INTRAVENOUS | Status: AC
  Administered 2013-02-13: 500 mL via INTRAVENOUS

## 2013-02-13 MED ORDER — SODIUM CHLORIDE 0.9 % IV BOLUS (SEPSIS)
500.0000 mL | Freq: Once | INTRAVENOUS | Status: AC
  Administered 2013-02-14: 500 mL via INTRAVENOUS

## 2013-02-13 NOTE — Evaluation (Signed)
Physical Therapy Evaluation Patient Details Name: Cynthia Velasquez MRN: [id number] DOB: 1951-02-04 Today's Date: 02/13/2013 Time: 4098-1191 PT Time Calculation (min): 43 min  PT Assessment / Plan / Recommendation Clinical Impression  pt s/p THR that presents with deficits in independence and mobility.  Patient will benefit from PT to increase independence and mobility for d/c home with family    PT Assessment  Patient needs continued PT services    Follow Up Recommendations  Home health PT    Does the patient have the potential to tolerate intense rehabilitation      Barriers to Discharge        Equipment Recommendations  None recommended by PT    Recommendations for Other Services     Frequency 7X/week    Precautions / Restrictions Precautions Precautions: Posterior Hip;Fall Restrictions LLE Weight Bearing: Weight bearing as tolerated   Pertinent Vitals/Pain Patient with pain moving in/out of bed, otherwise very little pain      Mobility  Bed Mobility Bed Mobility: Supine to Sit;Sit to Supine;Sitting - Scoot to Edge of Bed Supine to Sit: 4: Min assist;HOB elevated Sitting - Scoot to [place] of Bed: 4: Min assist Sit to Supine: 4: Min assist Details for Bed Mobility Assistance: Min A for LLE to EOB. Cues for sequencing. Transfers Transfers: Sit to Stand;Stand to Sit Sit to Stand: 4: Min assist;With upper extremity assist;From elevated surface;From bed Stand to Sit: 4: Min guard;To bed;With upper extremity assist Ambulation/Gait Ambulation/Gait Assistance: 4: Min guard Ambulation Distance (Feet): 80 Feet Assistive device: Rolling walker Gait Pattern: Step-to pattern Gait velocity: decreased; very slow    Exercises Total Joint Exercises Ankle Circles/Pumps: AROM;Strengthening;Both;10 reps;Seated Long Arc Quad: AROM;Left;Strengthening;10 reps;Seated   PT Diagnosis: Difficulty walking  PT Problem List: Decreased strength;Decreased activity tolerance;Decreased  mobility;Decreased knowledge of use of DME PT Treatment Interventions: Gait training;DME instruction;Stair training;Functional mobility training;Therapeutic activities;Therapeutic exercise   PT Goals Acute Rehab PT Goals PT Goal Formulation: With patient Time For Goal Achievement: 02/20/13 Potential to Achieve Goals: Good Pt will go Supine/Side to Sit: with modified independence;with HOB 0 degrees PT Goal: Supine/Side to Sit - Progress: Goal set today Pt will Sit at Edge of Bed: with modified independence PT Goal: Sit at Edge Of Bed - Progress: Goal set today Pt will go Sit to Supine/Side: with modified independence;with HOB 0 degrees PT Goal: Sit to Supine/Side - Progress: Goal set today Pt will go Sit to Stand: with modified independence;with upper extremity assist PT Goal: Sit to Stand - Progress: Goal set today Pt will go Stand to Sit: with modified independence;with upper extremity assist PT Goal: Stand to Sit - Progress: Goal set today Pt will Ambulate: 51 - 150 feet;with modified independence;with rolling walker PT Goal: Ambulate - Progress: Goal set today Pt will Go Up / Down Stairs: 1-2 stairs;with min assist;with least restrictive assistive device PT Goal: Up/Down Stairs - Progress: Goal set today Pt will Perform Home Exercise Program: with supervision, verbal cues required/provided PT Goal: Perform Home Exercise Program - Progress: Goal set today  Visit Information  Last PT Received On: 02/13/13 Assistance Needed: +1    Subjective Data  Subjective: I got dizzy this morning; I think my legs are uneven now Patient Stated Goal: go home   Prior Functioning  Home Living Lives With: Daughter Available Help at Discharge: Family;Available 24 hours/day (niece staying with her) Type of Home: Apartment Home Access: Stairs to enter Entrance Stairs-Number of Steps: 1 Entrance Stairs-Rails: None Home Layout: One level Bathroom Shower/Tub:  Tub/shower unit;Curtain Engineer, maintenance (IT): Yes How Accessible: Accessible via walker Home Adaptive Equipment: Walker - rolling Prior Function Level of Independence: Independent Driving: Yes Vocation: Full time employment Communication Communication: No difficulties Dominant Hand: Right    Cognition  Cognition Arousal/Alertness: Awake/alert Behavior During Therapy: WFL for tasks assessed/performed Overall Cognitive Status: Within Functional Limits for tasks assessed    Extremity/Trunk Assessment Right Lower Extremity Assessment RLE ROM/Strength/Tone: WFL for tasks assessed Left Lower Extremity Assessment LLE ROM/Strength/Tone: Unable to fully assess;Due to precautions;Deficits LLE ROM/Strength/Tone Deficits: knee and ankle WFL ROM; ankle WFL strength; knee at least 3/5 - no resistance given Trunk Assessment Trunk Assessment: Normal   Balance Balance Balance Assessed:  (no apparent balance deficits)  End of Session PT - End of Session Equipment Utilized During Treatment: Gait belt Activity Tolerance: Patient tolerated treatment well Patient left: in bed Nurse Communication: Mobility status  GP     Olivia Canter, [place] [phone] 02/13/2013, 4:19 PM

## 2013-02-13 NOTE — Evaluation (Signed)
Occupational Therapy Evaluation Patient Details Name: Cynthia Velasquez MRN: [id number] DOB: 01-Aug-1951 Today's Date: 02/13/2013 Time: 4098-1191 OT Time Calculation (min): 27 min  OT Assessment / Plan / Recommendation Clinical Impression   62 y.o. S/p  Lt posterior THA. Pt presents with below problem list. Pt limited in session today due to fatigue and dizziness. Pt will benefit from acute OT to increase independence prior to d/c. Pt planning to d/c home with family available to assist 24/7. Recommend HHOT upon d/c.     OT Assessment  Patient needs continued OT Services    Follow Up Recommendations  Home health OT;Supervision/Assistance - 24 hour    Barriers to Discharge      Equipment Recommendations  Other (comment) (tub equipment tbd)    Recommendations for Other Services    Frequency  Min 2X/week    Precautions / Restrictions Precautions Precautions: Posterior Hip;Fall Precaution Booklet Issued: Yes (comment) Precaution Comments: Reviewed precautions with patient. Restrictions Weight Bearing Restrictions: Yes LLE Weight Bearing: Weight bearing as tolerated   Pertinent Vitals/Pain Pt dizzyi in session. BP 115/86 sitting in recliner. Nurse notified. Pt pain 9/10 in hip. Nurse notified for pain meds.     ADL  Eating/Feeding: Independent Where Assessed - Eating/Feeding: Chair Grooming: Set up Where Assessed - Grooming: Supported sitting Upper Body Bathing: Set up Where Assessed - Upper Body Bathing: Supported sitting Lower Body Bathing: Maximal assistance Where Assessed - Lower Body Bathing: Supported sit to stand Upper Body Dressing: Set up Where Assessed - Upper Body Dressing: Supported sitting Lower Body Dressing: Maximal assistance Where Assessed - Lower Body Dressing: Supported sit to Pharmacist, hospital: Performed;Minimal Dentist Method: Sit to Barista: Raised toilet seat with arms (or 3-in-1 over toilet) Toileting -  Clothing Manipulation and Hygiene: +1 Total assistance (pt was dizzy ) Where Assessed - Glass blower/designer Manipulation and Hygiene: Standing Tub/Shower Transfer Method: Not assessed Equipment Used: Gait belt;Rolling walker Transfers/Ambulation Related to ADLs: Minguard/MinA for ambulation. Pt required Mod A for intial stand from bed. Min A for sit to stand from 3 in 1.  ADL Comments: Pt limited in session today due to dizziness. Pt ambulated to 3 in1 but became dizzy with ambulation. OT assisted with clothing manipulation for this reason. Pt requesting to get back in recliner after very short period of time on 3 in 1. BP was 115/86 after sitting in chair and dizziness eased off.     OT Diagnosis: Acute pain  OT Problem List: Decreased strength;Decreased range of motion;Decreased activity tolerance;Impaired balance (sitting and/or standing);Decreased knowledge of use of DME or AE;Decreased knowledge of precautions;Pain OT Treatment Interventions: Self-care/ADL training;DME and/or AE instruction;Therapeutic activities;Patient/family education;Balance training   OT Goals Acute Rehab OT Goals OT Goal Formulation: With patient Time For Goal Achievement: 02/20/13 Potential to Achieve Goals: Good ADL Goals Pt Will Perform Lower Body Bathing: with modified independence;Sit to stand from chair;with adaptive equipment ADL Goal: Lower Body Bathing - Progress: Goal set today Pt Will Perform Lower Body Dressing: with modified independence;Sit to stand from bed;Sit to stand from chair;with adaptive equipment ADL Goal: Lower Body Dressing - Progress: Goal set today Pt Will Transfer to Toilet: with modified independence;Ambulation;with DME ADL Goal: Toilet Transfer - Progress: Goal set today Pt Will Perform Toileting - Clothing Manipulation: with modified independence;Standing ADL Goal: Toileting - Clothing Manipulation - Progress: Goal set today Pt Will Perform Toileting - Hygiene: with modified  independence;Sit to stand from 3-in-1/toilet;Sitting on 3-in-1 or toilet ADL Goal: Toileting -  Hygiene - Progress: Goal set today Pt Will Perform Tub/Shower Transfer: Tub transfer;with modified independence;Ambulation;with DME ADL Goal: Tub/Shower Transfer - Progress: Goal set today Miscellaneous OT Goals Miscellaneous OT Goal #1: Pt will be able to independently demonstrate and verbalize 3/3 hip precautions. OT Goal: Miscellaneous Goal #1 - Progress: Goal set today  Visit Information  Last OT Received On: 02/13/13 Assistance Needed: +1    Subjective Data      Prior Functioning     Home Living Lives With: Daughter Available Help at Discharge: Family;Available 24 hours/day (niece staying with her) Type of Home: Apartment Home Access: Stairs to enter Entrance Stairs-Number of Steps: 1 Entrance Stairs-Rails: None Home Layout: One level Bathroom Shower/Tub: Forensic scientist: Standard Bathroom Accessibility: Yes How Accessible: Accessible via walker Home Adaptive Equipment: Bedside commode/3-in-1 Prior Function Level of Independence: Independent Driving: Yes Vocation: Full time employment Communication Communication: No difficulties Dominant Hand: Right         Vision/Perception     Cognition  Cognition Arousal/Alertness: Awake/alert Behavior During Therapy: WFL for tasks assessed/performed Overall Cognitive Status: Within Functional Limits for tasks assessed    Extremity/Trunk Assessment Right Upper Extremity Assessment RUE ROM/Strength/Tone: WFL for tasks assessed Left Upper Extremity Assessment LUE ROM/Strength/Tone: WFL for tasks assessed     Mobility Bed Mobility Bed Mobility: Supine to Sit Supine to Sit: 4: Min assist;HOB elevated Details for Bed Mobility Assistance: Min A for LLE to EOB. Cues for sequencing. Transfers Transfers: Sit to Stand;Stand to Sit Sit to Stand: 3: Mod assist;With upper extremity assist;From bed;4: Min  assist;From chair/3-in-1 Stand to Sit: 4: Min assist;With upper extremity assist;To chair/3-in-1 Details for Transfer Assistance: Mod A for intitial stand from bed. Pt then requiring Min A from 3 in 1.  Cues for hand placement.     Exercise     Balance     End of Session OT - End of Session Equipment Utilized During Treatment: Gait belt Activity Tolerance: Other (comment);Patient limited by fatigue (dizziness) Patient left: in chair;with call bell/phone within reach Nurse Communication: Mobility status;Patient requests pain meds;Other (comment) (pt BP)  GO     Earlie Raveling OTR/L [phone] 02/13/2013, 9:21 AM

## 2013-02-13 NOTE — Progress Notes (Signed)
Subjective: 1 Day Post-Op Procedure(s) (LRB): LEFT TOTAL HIP ARTHROPLASTY (Left) Patient reports pain as 6 on 0-10 scale.  Not out of bed yet. Foley remove this morning.  Taking by mouth okay.  Objective: Vital signs in last 24 hours: Temp:  [96.8 F (36 C)-98.2 F (36.8 C)] 97.1 F (36.2 C) (05/31 0645) Pulse Rate:  [54-93] 82 (05/31 0645) Resp:  [10-20] 16 (05/31 0645) BP: (118-153)/(70-107) 135/96 mmHg (05/31 0645) SpO2:  [97 %-100 %] 100 % (05/31 0645)  Intake/Output from previous day: 05/30 0701 - 05/31 0700 In: 1798.3 [I.V.:1798.3] Out: 1390 [Urine:990; Blood:400] Intake/Output this shift:     Recent Labs  02/11/13 1543 02/13/13 0440  HGB 13.0 10.2*    Recent Labs  02/11/13 1543 02/13/13 0440  WBC 7.5 7.0  RBC 4.91 3.81*  HCT 39.8 30.9*  PLT 245 209    Recent Labs  02/11/13 1543 02/13/13 0440  NA 141 135  K 4.2 3.7  CL 103 99  CO2 26 26  BUN 15 10  CREATININE 0.83 0.85  GLUCOSE 96 132*  CALCIUM 9.8 8.4    Recent Labs  02/11/13 1543  INR 1.03  left hip exam:  Neurologically intact Neurovascular intact Sensation intact distally Intact pulses distally Dorsiflexion/Plantar flexion intact Incision: moderate drainage Compartment soft  Assessment/Plan: 1 Day Post-Op Procedure(s) (LRB): LEFT TOTAL HIP ARTHROPLASTY (Left) Plan: Dressing change left hip by nursing. Weightbearing as tolerated on left with posterior hip precautions. Aspirin for DVT prophylaxis. Up with therapy  Demone Lyles G 02/13/2013, 9:06 AM

## 2013-02-13 NOTE — Care Management Note (Signed)
CARE MANAGEMENT NOTE 02/13/2013  Patient:  Cynthia Velasquez, Cynthia Velasquez   Account Number:  [id number]  Date Initiated:  02/13/2013  Documentation initiated by:  Vance Peper  Subjective/Objective Assessment:   62 yr old female s/p left total hip arthroplasty.     Action/Plan:   CM spoke with patient concerning home health and DME needs. Choice offered. Patient preoperatively setup with [facility], no changes.Has 3in1. RW ordered.   Anticipated DC Date:  02/14/2013   Anticipated DC Plan:  HOME W HOME HEALTH SERVICES      DC Planning Services  CM consult      PAC Choice  DURABLE MEDICAL EQUIPMENT  HOME HEALTH   Choice offered to / List presented to:  C-1 Patient   DME arranged  Levan Hurst      DME agency  [facility]     HH arranged  HH-2 PT      [facility] agency  [facility]   Status of service:  Completed, signed off Medicare Important Message given?   (If response is "NO", the following Medicare IM given date fields will be blank) Date Medicare IM given:   Date Additional Medicare IM given:    Discharge Disposition:  HOME W HOME HEALTH SERVICES  Per UR Regulation:    If discussed at Long Length of Stay Meetings, dates discussed:    Comments:

## 2013-02-13 NOTE — Progress Notes (Signed)
Orthopedic Tech Progress Note Patient Details:  Kynisha Memon 1951/09/08 [id number]  Patient ID: [name], female   DOB: 05/30/1951, 62 y.o.   MRN: [id number]   Shawnie Pons 02/13/2013, 5:20 PMTRAPEZE BAR

## 2013-02-14 MED ORDER — POLYETHYLENE GLYCOL 3350 17 G PO PACK
17.0000 g | PACK | Freq: Every day | ORAL | Status: DC
  Administered 2013-02-14 – 2013-02-15 (×2): 17 g via ORAL
  Filled 2013-02-14 (×3): qty 1

## 2013-02-14 NOTE — Progress Notes (Signed)
Subjective: 2 Days Post-Op Procedure(s) (LRB): LEFT TOTAL HIP ARTHROPLASTY (Left) Patient reports pain as 5 on 0-10 scale.   No dizziness when up with PT Taking po/voiding ok.  Objective: Vital signs in last 24 hours: Temp:  [98.6 F (37 C)-99.7 F (37.6 C)] 99.3 F (37.4 C) (06/01 0442) Pulse Rate:  [81-90] 86 (06/01 0442) Resp:  [16-20] 18 (06/01 0442) BP: (79-107)/(38-59) 107/59 mmHg (06/01 0442) SpO2:  [97 %-99 %] 99 % (06/01 0442)  Intake/Output from previous day: 05/31 0701 - 06/01 0700 In: 2837.9 [P.O.:1840; I.V.:997.9] Out: -  Intake/Output this shift:     Recent Labs  02/11/13 1543 02/13/13 0440 02/14/13 0415  HGB 13.0 10.2* 8.1*    Recent Labs  02/13/13 0440 02/14/13 0415  WBC 7.0 9.1  RBC 3.81* 3.07*  HCT 30.9* 24.9*  PLT 209 178    Recent Labs  02/11/13 1543 02/13/13 0440  NA 141 135  K 4.2 3.7  CL 103 99  CO2 26 26  BUN 15 10  CREATININE 0.83 0.85  GLUCOSE 96 132*  CALCIUM 9.8 8.4    Recent Labs  02/11/13 1543  INR 1.03  Left hip exam:  Neurovascular intact Sensation intact distally Intact pulses distally Dorsiflexion/Plantar flexion intact Incision: dressing C/D/I Compartment soft  Assessment/Plan: 2 Days Post-Op Procedure(s) (LRB): LEFT TOTAL HIP ARTHROPLASTY (Left) Acute blood loss anemia Plan: Cont oral Iron Check CBC in am. Cont PT Plan for discharge tomorrow  Aragon Scarantino G 02/14/2013, 9:29 AM

## 2013-02-14 NOTE — Progress Notes (Signed)
Physical Therapy Treatment Patient Details Name: Cynthia Velasquez MRN: [id number] DOB: [birth year] Today's Date: 02/14/2013 Time: 4098-1191 PT Time Calculation (min): 46 min  PT Assessment / Plan / Recommendation Comments on Treatment Session  Making functional gains despite being very tired during session; No reports of dizziness    Follow Up Recommendations  Home health PT     Does the patient have the potential to tolerate intense rehabilitation     Barriers to Discharge        Equipment Recommendations  None recommended by PT    Recommendations for Other Services    Frequency 7X/week   Plan Discharge plan remains appropriate    Precautions / Restrictions Precautions Precautions: Posterior Hip;Fall Precaution Comments: Reviewed precautions with patient. Restrictions LLE Weight Bearing: Weight bearing as tolerated   Pertinent Vitals/Pain 5/10 L hip pain; repositioned in bed; RN notified    Mobility  Bed Mobility Bed Mobility: Supine to Sit;Sitting - Scoot to Edge of Bed;Sit to Supine Supine to Sit: 4: Min assist;With rails Sitting - Scoot to Edge of Bed: 4: Min guard Sit to Supine: 4: Min assist Details for Bed Mobility Assistance: Min A for LLE to EOB. Cues for sequencing. Transfers Transfers: Sit to Stand;Stand to Sit Sit to Stand: 4: Min guard;With upper extremity assist;From bed Stand to Sit: 4: Min guard;With upper extremity assist;To chair/3-in-1 Details for Transfer Assistance: Making improvements; Cues for hand placement and LLE positioning for prec Ambulation/Gait Ambulation/Gait Assistance: 4: Min guard Ambulation Distance (Feet): 80 Feet Assistive device: Rolling walker Ambulation/Gait Assistance Details: Overall managing well; very tired during session but quite willing to work; Pt states she feels she has a leg length discrepance Gait Pattern: Step-to pattern Gait velocity: decreased; very slow    Exercises Total Joint Exercises Quad Sets: AROM;Left;10  reps Gluteal Sets: AROM;Both;10 reps Towel Squeeze: AROM;Left;10 reps Heel Slides: AAROM;Left;10 reps Hip ABduction/ADduction: AAROM;Left;10 reps   PT Diagnosis:    PT Problem List:   PT Treatment Interventions:     PT Goals Acute Rehab PT Goals Time For Goal Achievement: 02/20/13 Potential to Achieve Goals: Good Pt will go Supine/Side to Sit: with modified independence;with HOB 0 degrees PT Goal: Supine/Side to Sit - Progress: Progressing toward goal Pt will Sit at [facility] of Bed: with modified independence PT Goal: Sit at Edge Of Bed - Progress: Progressing toward goal Pt will go Sit to Supine/Side: with modified independence;with HOB 0 degrees PT Goal: Sit to Supine/Side - Progress: Progressing toward goal Pt will go Sit to Stand: with modified independence;with upper extremity assist PT Goal: Sit to Stand - Progress: Progressing toward goal Pt will go Stand to Sit: with modified independence;with upper extremity assist PT Goal: Stand to Sit - Progress: Progressing toward goal Pt will Ambulate: 51 - 150 feet;with modified independence;with rolling walker PT Goal: Ambulate - Progress: Progressing toward goal Pt will Perform Home Exercise Program: with supervision, verbal cues required/provided PT Goal: Perform Home Exercise Program - Progress: Progressing toward goal  Visit Information  Last PT Received On: 02/14/13 Assistance Needed: +1    Subjective Data  Subjective: Very tired; not a lot of rest Patient Stated Goal: go home   Cognition  Cognition Arousal/Alertness: Awake/alert (though quite sleepy) Behavior During Therapy: WFL for tasks assessed/performed Overall Cognitive Status: Within Functional Limits for tasks assessed    Balance     End of Session PT - End of Session Equipment Utilized During Treatment: Gait belt Activity Tolerance: Patient tolerated treatment well Patient left: in bed;with  call bell/phone within reach   GP     Van Clines [facility] [place], [place] [phone]  02/14/2013, 10:42 AM

## 2013-02-14 NOTE — Progress Notes (Signed)
Physical Therapy Note   02/14/13 1600  PT Visit Information  Last PT Received On 02/14/13  Assistance Needed +1  PT Time Calculation  PT Start Time 1542  PT Stop Time 1604  PT Time Calculation (min) 22 min  Subjective Data  Subjective Had just gotten back to bed; Agreeable to therex  Patient Stated Goal go home  Precautions  Precautions Posterior Hip;Fall  Precaution Comments Reviewed precautions with patient.  Restrictions  LLE Weight Bearing WBAT  Cognition  Arousal/Alertness Awake/alert  Behavior During Therapy WFL for tasks assessed/performed  Overall Cognitive Status Within Functional Limits for tasks assessed  Exercises  Exercises Total Joint  Total Joint Exercises  Ankle Circles/Pumps AROM;Strengthening;Both;10 reps;Seated  Quad Sets AROM;Left;10 reps  Gluteal Sets AROM;Both;10 reps  Towel Squeeze AROM;Left;10 reps  Short Arc Quad AROM;Left;10 reps  Heel Slides AAROM;Left;10 reps  Hip ABduction/ADduction AAROM;Left;10 reps  Bridges AROM;Both;10 reps  PT - End of Session  Activity Tolerance Patient tolerated treatment well  Patient left in bed;with call bell/phone within reach;with family/visitor present  PT - Assessment/Plan  Comments on Treatment Session Good session in which we emphasized therex and performed using handout; Daughter present  PT Plan Discharge plan remains appropriate  PT Frequency 7X/week  Follow Up Recommendations Home health PT  PT equipment None recommended by PT  Acute Rehab PT Goals  Time For Goal Achievement 02/20/13  Potential to Achieve Goals Good  Pt will Perform Home Exercise Program with supervision, verbal cues required/provided  PT Goal: Perform Home Exercise Program - Progress Progressing toward goal  PT General Charges  $$ ACUTE PT VISIT 1 Procedure  PT Treatments  $Therapeutic Exercise 8-22 mins   [place], [place] [phone]

## 2013-02-15 ENCOUNTER — Encounter (HOSPITAL_COMMUNITY): Payer: Self-pay | Admitting: General Practice

## 2013-02-15 DIAGNOSIS — M1612 Unilateral primary osteoarthritis, left hip: Secondary | ICD-10-CM

## 2013-02-15 LAB — CBC
MCH: 26.4 pg (ref 26.0–34.0)
MCHC: 32.5 g/dL (ref 30.0–36.0)
Platelets: 178 10*3/uL (ref 150–400)
Platelets: 195 10*3/uL (ref 150–400)
RBC: 3.07 MIL/uL — ABNORMAL LOW (ref 3.87–5.11)
RBC: 2.89 MIL/uL — ABNORMAL LOW (ref 3.87–5.11)
RDW: 15.8 % — ABNORMAL HIGH (ref 11.5–15.5)
WBC: 7.7 10*3/uL (ref 4.0–10.5)

## 2013-02-15 MED ORDER — METHOCARBAMOL 500 MG PO TABS: 500.0000 mg | ORAL_TABLET | Freq: Four times a day (QID) | ORAL | Status: DC | PRN

## 2013-02-15 MED ORDER — OXYCODONE-ACETAMINOPHEN 5-325 MG PO TABS: 1.0000 | ORAL_TABLET | ORAL | Status: DC | PRN

## 2013-02-15 MED ORDER — ASPIRIN 325 MG PO TBEC: 325.0000 mg | DELAYED_RELEASE_TABLET | Freq: Two times a day (BID) | ORAL | Status: DC

## 2013-02-15 NOTE — Progress Notes (Signed)
Patient discharged to home accompanied by family. Discharge instructions and rx given and explained and patient stated understanding. Patients IV was removed. Left unit in a stable condition via wheelchair.

## 2013-02-15 NOTE — Progress Notes (Addendum)
Occupational Therapy Treatment Patient Details Name: Cynthia Velasquez MRN: [id number] DOB: 1951/04/05 Today's Date: 02/15/2013 Time: 4098-1191 OT Time Calculation (min): 46 min  OT Assessment / Plan / Recommendation Comments on Treatment Session Pt progressing towards goals. Educated and practiced with AE and also practiced tub transfer.    Follow Up Recommendations  Home health OT;Supervision/Assistance - 24 hour    Barriers to Discharge       Equipment Recommendations  None recommended by OT    Recommendations for Other Services    Frequency Min 2X/week   Plan Discharge plan remains appropriate    Precautions / Restrictions Precautions Precautions: Posterior Hip;Fall Precaution Comments: Reviewed precautions with patient. Restrictions Weight Bearing Restrictions: Yes LLE Weight Bearing: Weight bearing as tolerated   Pertinent Vitals/Pain No pain reported. Spasm in buttock area. Repositioned.     ADL  Grooming: Performed;Wash/dry hands;Teeth care;Min guard Where Assessed - Grooming: Supported standing Lower Body Dressing: Min guard: Minimal Assistance (Min A due to several cues to maintain precautions) Where Assessed - Lower Body Dressing: Supported sit to Pharmacist, hospital: Performed;Min Pension scheme manager Method: Sit to Barista: Raised toilet seat with arms (or 3-in-1 over toilet) Toileting - Clothing Manipulation and Hygiene: Performed;Min guard Where Assessed - Engineer, mining and Hygiene: Sit to stand from 3-in-1 or toilet Tub/Shower Transfer: Performed;Min guard Tub/Shower Transfer Method: Science writer: Other (comment) (3 in 1) Equipment Used: Gait belt;Rolling walker;Sock aid;Reacher;Long-handled sponge;Long-handled shoe horn Transfers/Ambulation Related to ADLs: Minguard. Cues to maintain precautions when ambulating and making turns (turning foot in). ADL Comments: Educated pt on AE. Pt  practiced with reacher and sockaid. Pt requiring several cues to maintain precautions while using reacher to don underwear. Pt practiced backing to tub and sitting on 3 in 1 at minguard level.     OT Diagnosis:    OT Problem List:   OT Treatment Interventions:     OT Goals Acute Rehab OT Goals OT Goal Formulation: With patient Time For Goal Achievement: 02/20/13 Potential to Achieve Goals: Good ADL Goals Pt Will Perform Lower Body Bathing: with modified independence;Sit to stand from chair;with adaptive equipment Pt Will Perform Lower Body Dressing: with modified independence;Sit to stand from bed;Sit to stand from chair;with adaptive equipment ADL Goal: Lower Body Dressing - Progress: Progressing toward goals Pt Will Transfer to Toilet: with modified independence;Ambulation;with DME ADL Goal: Toilet Transfer - Progress: Progressing toward goals Pt Will Perform Toileting - Clothing Manipulation: with modified independence;Standing ADL Goal: Toileting - Clothing Manipulation - Progress: Progressing toward goals Pt Will Perform Toileting - Hygiene: with modified independence;Sit to stand from 3-in-1/toilet;Sitting on 3-in-1 or toilet ADL Goal: Toileting - Hygiene - Progress: Progressing toward goals Pt Will Perform Tub/Shower Transfer: Tub transfer;with modified independence;Ambulation;with DME ADL Goal: Tub/Shower Transfer - Progress: Progressing toward goals Miscellaneous OT Goals Miscellaneous OT Goal #1: Pt will be able to independently demonstrate and verbalize 3/3 hip precautions. OT Goal: Miscellaneous Goal #1 - Progress: Progressing toward goals  Visit Information  Last OT Received On: 02/15/13 Assistance Needed: +1    Subjective Data      Prior Functioning       Cognition  Cognition Arousal/Alertness: Awake/alert Behavior During Therapy: WFL for tasks assessed/performed Overall Cognitive Status: Within Functional Limits for tasks assessed    Mobility  Bed  Mobility Bed Mobility: Supine to Sit;Sitting - Scoot to Edge of Bed Supine to Sit: 5: Supervision Sitting - Scoot to Edge of Bed: 5: Supervision Details for Bed Mobility  Assistance: Supervision for safety.  Transfers Transfers: Sit to Stand;Stand to Sit Sit to Stand: 4: Min guard;With upper extremity assist;From bed;From chair/3-in-1 Stand to Sit: 4: Min guard;With upper extremity assist;To chair/3-in-1 Details for Transfer Assistance: Minguard for safety. Cue for positioning of  LLE when sitting and standing.    Exercises      Balance     End of Session OT - End of Session Equipment Utilized During Treatment: Gait belt Activity Tolerance: Patient tolerated treatment well Patient left: in chair;with call bell/phone within reach  GO     Earlie Raveling OTR/L [phone] 02/15/2013, 10:24 AM

## 2013-02-15 NOTE — Progress Notes (Signed)
PATIENT ID: Cynthia Velasquez  MRN: [id number]  DOB/AGE:  1950-11-26 / 62 y.o.  3 Days Post-Op Procedure(s) (LRB): LEFT TOTAL HIP ARTHROPLASTY (Left)    PROGRESS NOTE Subjective: Patient is alert, oriented,no Nausea, no Vomiting, yes passing gas, no Bowel Movement. Taking PO well. Denies SOB, Chest or Calf Pain. Using Incentive Spirometer, PAS in place. Complains of dizziness this a.m. Ambulating well with PT. Patient reports pain as moderate  .    Objective: Vital signs in last 24 hours: Filed Vitals:   02/14/13 0442 02/14/13 1502 02/14/13 2128 02/15/13 0500  BP: 107/59 113/59 120/57 120/65  Pulse: 86 109 98 86  Temp: 99.3 F (37.4 C) 100 F (37.8 C) 99.4 F (37.4 C) 98.3 F (36.8 C)  TempSrc:  Oral    Resp: 18 18 18 18   SpO2: 99% 95% 98% 99%      Intake/Output from previous day: I/O last 3 completed shifts: In: 6755 [P.O.:2380; I.V.:4375] Out: -    Intake/Output this shift:     LABORATORY DATA:  Recent Labs  02/13/13 0440 02/14/13 0415 02/15/13 0436  WBC 7.0 9.1 7.7  HGB 10.2* 8.1* 7.6*  HCT 30.9* 24.9* 23.6*  PLT 209 178 195  NA 135  --   --   K 3.7  --   --   CL 99  --   --   CO2 26  --   --   BUN 10  --   --   CREATININE 0.85  --   --   GLUCOSE 132*  --   --   CALCIUM 8.4  --   --     Examination: Neurologically intact ABD soft Neurovascular intact Sensation intact distally Intact pulses distally Dorsiflexion/Plantar flexion intact Incision: scant drainage} XR AP&Lat of hip shows well placed\fixed THA  Assessment:   3 Days Post-Op Procedure(s) (LRB): LEFT TOTAL HIP ARTHROPLASTY (Left) ADDITIONAL DIAGNOSIS:  Acute Blood Loss Anemia  Plan: PT/OT WBAT, THA  posterior precautions  DVT Prophylaxis: SCDx72 hrs, ASA 325 mg BID x 2 weeks  DISCHARGE PLAN: Home today if PT goals met.  Will transfuse 1 unit PRBC if dizziness worsens.  Patient declined blood this morning.  DISCHARGE NEEDS: HHPT, HHRN, Walker and 3-in-1 comode seat

## 2013-02-15 NOTE — Care Management Note (Signed)
CARE MANAGEMENT NOTE 02/15/2013  Patient:  ESTELA, VINAL   Account Number:  [id number]  Date Initiated:  02/13/2013  Documentation initiated by:  Vance Peper  Subjective/Objective Assessment:   62 yr old female s/p left total hip arthroplasty.     Action/Plan:   CM spoke with patient concerning home health and DME needs. Choice offered. Patient preoperatively setup with [facility], no changes.Has 3in1. RW ordered.   Anticipated DC Date:  02/15/2013   Anticipated DC Plan:  HOME W HOME HEALTH SERVICES      DC Planning Services  CM consult      PAC Choice  DURABLE MEDICAL EQUIPMENT  HOME HEALTH   Choice offered to / List presented to:  C-1 Patient   DME arranged  Levan Hurst      DME agency  [facility]     HH arranged  HH-2 PT      [facility] agency  [facility]   Status of service:  Completed, signed off Medicare Important Message given?   (If response is "NO", the following Medicare IM given date fields will be blank) Date Medicare IM given:   Date Additional Medicare IM given:    Discharge Disposition:  HOME W HOME HEALTH SERVICES  Per UR Regulation:  Reviewed for med. necessity/level of care/duration of stay  If discussed at Long Length of Stay Meetings, dates discussed:    Comments:  02/15/13 4:32pm Vance Peper, RN BSN NCM CM spoke with patient, she requested that [facility] be given her cell phone # [phone]. CM called Jodene Nam, [facility] liasion and provided the number.

## 2013-02-15 NOTE — Progress Notes (Signed)
Physical Therapy Note   02/15/13 1600  PT Visit Information  Last PT Received On 02/15/13  Assistance Needed +1  PT Time Calculation  PT Start Time 1601  PT Stop Time 1626  PT Time Calculation (min) 25 min  Subjective Data  Subjective Pt reports she is confident in her ability to manage at home (this PT agrees)  Patient Stated Goal go home  Precautions  Precautions Posterior Hip  Precaution Comments Reviewed precautions with patient.  Restrictions  LLE Weight Bearing WBAT  Cognition  Arousal/Alertness Awake/alert  Behavior During Therapy WFL for tasks assessed/performed  Overall Cognitive Status Within Functional Limits for tasks assessed  Bed Mobility  Bed Mobility Sit to Supine  Sit to Supine 4: Min guard;HOB flat  Details for Bed Mobility Assistance Cues and instruction in using sheet roll to assist LLE into bed  Transfers  Transfers Sit to Stand;Stand to Sit  Sit to Stand 6: Modified independent (Device/Increase time)  Stand to Sit 6: Modified independent (Device/Increase time)  Details for Transfer Assistance Smooth transition  Ambulation/Gait  Ambulation/Gait Assistance 5: Supervision  Ambulation Distance (Feet) 100 Feet  Assistive device Rolling walker  Ambulation/Gait Assistance Details Good self-monitor for activity tol; No dizzy spells  Gait Pattern Step-to pattern  PT - End of Session  Activity Tolerance Patient tolerated treatment well  Patient left in bed;with call bell/phone within reach  PT - Assessment/Plan  Comments on Treatment Session Continuing progress; Seeming to tolerate activity better each session; OK for dc home from PT standpoint  PT Plan Discharge plan remains appropriate  PT Frequency 7X/week  Follow Up Recommendations Home health PT  PT equipment Rolling walker with 5" wheels  Acute Rehab PT Goals  Time For Goal Achievement 02/20/13  Potential to Achieve Goals Good  Pt will go Supine/Side to Sit with modified independence;with HOB 0  degrees  PT Goal: Supine/Side to Sit - Progress Progressing toward goal  Pt will Sit at [facility] of Bed with modified independence  PT Goal: Sit at [facility] Of Bed - Progress Met  Pt will go Sit to Supine/Side with modified independence;with HOB 0 degrees  PT Goal: Sit to Supine/Side - Progress Progressing toward goal  Pt will go Sit to Stand with modified independence;with upper extremity assist  PT Goal: Sit to Stand - Progress Partly met  Pt will go Stand to Sit with modified independence;with upper extremity assist  PT Goal: Stand to Sit - Progress Partly met  Pt will Ambulate 51 - 150 feet;with modified independence;with rolling walker  PT Goal: Ambulate - Progress Progressing toward goal  PT General Charges  $$ ACUTE PT VISIT 1 Procedure  PT Treatments  $Gait Training 8-22 mins  $Therapeutic Activity 8-22 mins   [place], [place] [phone]

## 2013-02-15 NOTE — Progress Notes (Signed)
I called the patient's daughter, Cynthia Velasquez, this afternoon.  She was unavailable so I left a detailed message regarding her mother's progress and that we feel she is ready for discharge this evening.  I gave her our office number and asked that she call tomorrow if they have any concerns.

## 2013-02-15 NOTE — Discharge Summary (Signed)
Patient ID: Cynthia Velasquez MRN: [id number] DOB/AGE: Feb 10, 1951 62 y.o.  Admit date: 02/12/2013 Discharge date: 02/15/2013  Admission Diagnoses:  Principal Problem:   Osteoarthritis of left hip   Discharge Diagnoses:  Same  Past Medical History  Diagnosis Date  . Cancer   . Breast cancer   . Lumbar stenosis   . Hyperlipidemia     takes Simvastatin daily  . Hypertension     takes Hyzaar  . History of migraine   . Arthritis   . Joint pain   . Joint swelling   . GERD (gastroesophageal reflux disease)     doesn't take any meds for this  . History of bladder infections     Surgeries: Procedure(s): LEFT TOTAL HIP ARTHROPLASTY on 02/12/2013   Consultants:    Discharged Condition: Improved  Hospital Course: Cynthia Velasquez is an 62 y.o. female who was admitted 02/12/2013 for operative treatment ofOsteoarthritis of left hip. Patient has severe unremitting pain that affects sleep, daily activities, and work/hobbies. After pre-op clearance the patient was taken to the operating room on 02/12/2013 and underwent  Procedure(s): LEFT TOTAL HIP ARTHROPLASTY.    Patient was given perioperative antibiotics: Anti-infectives   Start     Dose/Rate Route Frequency Ordered Stop   02/12/13 0600  ceFAZolin (ANCEF) IVPB 2 g/50 mL premix     2 g 100 mL/hr over 30 Minutes Intravenous On call to O.R. 02/11/13 1426 02/12/13 1235       Patient was given sequential compression devices, early ambulation, and chemoprophylaxis to prevent DVT.  Patient benefited maximally from hospital stay and there were no complications.    Recent vital signs: Patient Vitals for the past 24 hrs:  BP Temp Temp src Pulse Resp SpO2  02/15/13 0500 120/65 mmHg 98.3 F (36.8 C) - 86 18 99 %  02/14/13 2128 120/57 mmHg 99.4 F (37.4 C) - 98 18 98 %  02/14/13 1502 113/59 mmHg 100 F (37.8 C) Oral 109 18 95 %     Recent laboratory studies:  Recent Labs  02/13/13 0440 02/14/13 0415 02/15/13 0436  WBC 7.0 9.1 7.7   HGB 10.2* 8.1* 7.6*  HCT 30.9* 24.9* 23.6*  PLT 209 178 195  NA 135  --   --   K 3.7  --   --   CL 99  --   --   CO2 26  --   --   BUN 10  --   --   CREATININE 0.85  --   --   GLUCOSE 132*  --   --   CALCIUM 8.4  --   --      Discharge Medications:     Medication List    TAKE these medications       aspirin 325 MG EC tablet  Take 1 tablet (325 mg total) by mouth 2 (two) times daily.     diphenhydrAMINE 25 MG tablet  Commonly known as:  BENADRYL  Take 25 mg by mouth daily as needed for itching.     losartan-hydrochlorothiazide 50-12.5 MG per tablet  Commonly known as:  HYZAAR  Take 1 tablet by mouth daily.     meloxicam 7.5 MG tablet  Commonly known as:  MOBIC  Take 7.5 mg by mouth daily.     methocarbamol 500 MG tablet  Commonly known as:  ROBAXIN  Take 1 tablet (500 mg total) by mouth every 6 (six) hours as needed.     oxyCODONE-acetaminophen 5-325 MG per tablet  Commonly known  as:  ROXICET  Take 1-2 tablets by mouth every 4 (four) hours as needed for pain.     simvastatin 20 MG tablet  Commonly known as:  ZOCOR  Take 20 mg by mouth daily.     tamoxifen 20 MG tablet  Commonly known as:  NOLVADEX  Take 20 mg by mouth daily.        Diagnostic Studies: Dg Chest 2 View  02/11/2013   *RADIOLOGY REPORT*  Clinical Data: Preop for left hip replacement, on medication for hypertension  CHEST - 2 VIEW  Comparison: None.  Findings: No active infiltrate or effusion is seen.  The lungs are slightly hyperaerated.  Mediastinal contours appear normal.  The heart is borderline enlarged.  Surgical clips overlie the right hilum and the right axilla.  IMPRESSION: No active lung disease.   Original Report Authenticated By: Dwyane Dee, M.D.   Dg Pelvis Portable  02/12/2013   *RADIOLOGY REPORT*  Clinical Data: Postop left total hip arthroplasty  PORTABLE PELVIS  Comparison: None.  Findings: Left total hip arthroplasty in satisfactory position.  Associated subcutaneous gas.   Moderate degenerative changes of the right hip.  No fracture or dislocation is seen.  IMPRESSION: Left total hip arthroplasty in satisfactory position.   Original Report Authenticated By: Charline Bills, M.D.   Dg Hip Portable 1 View Left  02/12/2013   *RADIOLOGY REPORT*  Clinical Data: Left hip replacement.  PORTABLE LEFT HIP - 1 VIEW  Comparison: None.  Findings: Study is somewhat limited by body habitus.  Left total hip replacement is in place.  No fracture is identified.  IMPRESSION: Left total hip replacement.  No acute finding.   Original Report Authenticated By: Holley Dexter, M.D.    Disposition: 01-Home or Self Care      Discharge Orders   Future Orders Complete By Expires     Call MD for:  redness, tenderness, or signs of infection (pain, swelling, redness, odor or green/yellow discharge around incision site)  As directed     Call MD for:  severe uncontrolled pain  As directed     Call MD for:  temperature >100.4  As directed     Change dressing (specify)  As directed     Comments:      Dressing change as needed.    Discharge instructions  As directed     Comments:      Follow up as scheduled (post-op day #14)    Driving Restrictions  As directed     Comments:      No driving for 2 weeks.    Increase activity slowly  As directed     May shower / [place]  As directed     Walker   As directed           Signed: Tanaisha Pittman M. 02/15/2013, 7:33 AM

## 2013-02-15 NOTE — Progress Notes (Signed)
Utilization review complete. Lanee Chain RN CCM Case Mgmt phone [phone] 

## 2013-02-15 NOTE — Progress Notes (Signed)
Physical Therapy Treatment Patient Details Name: Cynthia Velasquez MRN: [id number] DOB: 1951-07-18 Today's Date: 02/15/2013 Time: 1113-1205 PT Time Calculation (min): 52 min  PT Assessment / Plan / Recommendation Comments on Treatment Session  Overall pt is managing well; Had one instance of dizziness while sitting, and pt's BP was 122/81; Discussed managing at home, and pt is confident; Her daughter has expressed concern with ability to manage at home, and I had a lengthy conversation with [name], her daughter, earlier today re: Ms. Siddoway'' functional status    Follow Up Recommendations  Home health PT     Does the patient have the potential to tolerate intense rehabilitation     Barriers to Discharge        Equipment Recommendations  Rolling walker with 5" wheels    Recommendations for Other Services OT consult  Frequency 7X/week   Plan Discharge plan remains appropriate    Precautions / Restrictions Precautions Precautions: Posterior Hip;Fall Precaution Comments: Reviewed precautions with patient. Restrictions Weight Bearing Restrictions: Yes LLE Weight Bearing: Weight bearing as tolerated   Pertinent Vitals/Pain 5/10 L hip; Pt also with reports of R hip pain; Repositioned as comfortably as possible in chair     Mobility  Bed Mobility Bed Mobility: Supine to Sit;Sitting - Scoot to Edge of Bed Supine to Sit: 5: Supervision Sitting - Scoot to Edge of Bed: 5: Supervision Details for Bed Mobility Assistance: Supervision for safety.  Transfers Transfers: Sit to Stand;Stand to Sit Sit to Stand: 5: Supervision;From bed;With upper extremity assist Stand to Sit: 5: Supervision;To chair/3-in-1;With upper extremity assist Details for Transfer Assistance: Smooth transition; occasional cues for handplacement, but sdid not require physical assist Ambulation/Gait Ambulation/Gait Assistance: 5: Supervision Ambulation Distance (Feet): 120 Feet Assistive device: Rolling  walker Ambulation/Gait Assistance Details: Cues to self-monitor for activity tolerance; Slow pace, but steady, without loss of balance; Reports RLE sciatica-type pain, possibly due to increased use ofRLE   Gait Pattern: Step-to pattern Gait velocity: decreased; very slow Stairs: Yes Stair Management Technique: No rails;Backwards;With walker Number of Stairs: 1 (verbal and demo cues for technique)    Exercises     PT Diagnosis:    PT Problem List:   PT Treatment Interventions:     PT Goals Acute Rehab PT Goals Time For Goal Achievement: 02/20/13 Potential to Achieve Goals: Good Pt will go Sit to Stand: with modified independence;with upper extremity assist PT Goal: Sit to Stand - Progress: Progressing toward goal Pt will go Stand to Sit: with modified independence;with upper extremity assist PT Goal: Stand to Sit - Progress: Progressing toward goal Pt will Ambulate: 51 - 150 feet;with modified independence;with rolling walker PT Goal: Ambulate - Progress: Progressing toward goal Pt will Go Up / Down Stairs: 1-2 stairs;with min assist;with least restrictive assistive device PT Goal: Up/Down Stairs - Progress: Partly met  Visit Information  Last PT Received On: 02/15/13 Assistance Needed: +1    Subjective Data  Subjective: Pt reports she is confident in her ability to manage at home (this PT agrees) Patient Stated Goal: go home   Cognition  Cognition Arousal/Alertness: Awake/alert Behavior During Therapy: WFL for tasks assessed/performed Overall Cognitive Status: Within Functional Limits for tasks assessed    Balance     End of Session PT - End of Session Equipment Utilized During Treatment: Gait belt Activity Tolerance: Patient tolerated treatment well Patient left: in chair;with call bell/phone within reach Nurse Communication: Mobility status   GP     Van Clines [facility] [place], [place] [phone]  02/15/2013, 12:54 PM

## 2013-02-21 ENCOUNTER — Emergency Department (HOSPITAL_COMMUNITY)
Admission: EM | Admit: 2013-02-21 | Discharge: 2013-02-21 | Disposition: A | Discharge status: Home / Self Care | Payer: BC Managed Care – PPO | Attending: Emergency Medicine | Admitting: Emergency Medicine

## 2013-02-21 ENCOUNTER — Emergency Department (HOSPITAL_COMMUNITY): Payer: BC Managed Care – PPO

## 2013-02-21 ENCOUNTER — Encounter (HOSPITAL_COMMUNITY): Payer: Self-pay | Admitting: *Deleted

## 2013-02-21 DIAGNOSIS — M48061 Spinal stenosis, lumbar region without neurogenic claudication: Secondary | ICD-10-CM | POA: Insufficient documentation

## 2013-02-21 DIAGNOSIS — I10 Essential (primary) hypertension: Secondary | ICD-10-CM | POA: Insufficient documentation

## 2013-02-21 DIAGNOSIS — E785 Hyperlipidemia, unspecified: Secondary | ICD-10-CM | POA: Insufficient documentation

## 2013-02-21 DIAGNOSIS — Z8669 Personal history of other diseases of the nervous system and sense organs: Secondary | ICD-10-CM | POA: Insufficient documentation

## 2013-02-21 DIAGNOSIS — M129 Arthropathy, unspecified: Secondary | ICD-10-CM | POA: Insufficient documentation

## 2013-02-21 DIAGNOSIS — M25559 Pain in unspecified hip: Secondary | ICD-10-CM | POA: Insufficient documentation

## 2013-02-21 DIAGNOSIS — Z885 Allergy status to narcotic agent status: Secondary | ICD-10-CM | POA: Insufficient documentation

## 2013-02-21 DIAGNOSIS — Z96649 Presence of unspecified artificial hip joint: Secondary | ICD-10-CM | POA: Insufficient documentation

## 2013-02-21 DIAGNOSIS — K219 Gastro-esophageal reflux disease without esophagitis: Secondary | ICD-10-CM | POA: Insufficient documentation

## 2013-02-21 DIAGNOSIS — M25552 Pain in left hip: Secondary | ICD-10-CM

## 2013-02-21 DIAGNOSIS — Z853 Personal history of malignant neoplasm of breast: Secondary | ICD-10-CM | POA: Insufficient documentation

## 2013-02-21 DIAGNOSIS — IMO0001 Reserved for inherently not codable concepts without codable children: Secondary | ICD-10-CM | POA: Insufficient documentation

## 2013-02-21 DIAGNOSIS — Z79899 Other long term (current) drug therapy: Secondary | ICD-10-CM | POA: Insufficient documentation

## 2013-02-21 DIAGNOSIS — Z9889 Other specified postprocedural states: Secondary | ICD-10-CM | POA: Insufficient documentation

## 2013-02-21 DIAGNOSIS — Z87440 Personal history of urinary (tract) infections: Secondary | ICD-10-CM | POA: Insufficient documentation

## 2013-02-21 DIAGNOSIS — Z7982 Long term (current) use of aspirin: Secondary | ICD-10-CM | POA: Insufficient documentation

## 2013-02-21 MED ORDER — OXYCODONE HCL ER 10 MG PO T12A
10.0000 mg | EXTENDED_RELEASE_TABLET | Freq: Two times a day (BID) | ORAL | Status: DC
  Administered 2013-02-21: 10 mg via ORAL
  Filled 2013-02-21: qty 1

## 2013-02-21 NOTE — ED Notes (Signed)
Pt reports having left hip replaced last Friday, was doing great with recovery. Got up this am 0500 and felt a "pop" to left hip followed by severe pain and bulging to incision.

## 2013-02-21 NOTE — ED Notes (Signed)
Pt returned from radiology.

## 2013-02-21 NOTE — ED Notes (Signed)
Pt c/o left sided hip pain that started after she heard a pop this morning. Pt reports the pain is only there when she is standing. Pt sts sitting and lifting leg along with doing her exercises don't cause her pain. Pt reports she took a robaxin pill PTA. Incision site is clean, dry, healing appropriately, no d/c from site. Pt in nad, skin warm and dry, resp e/u.

## 2013-02-21 NOTE — ED Provider Notes (Signed)
Medical screening examination/treatment/procedure(s) were performed by non-physician practitioner and as supervising physician I was immediately available for consultation/collaboration.   Glynn Octave, MD 02/21/13 1600

## 2013-02-21 NOTE — ED Provider Notes (Signed)
History     CSN: [id number]  Arrival date & time 02/21/13  1105   First MD Initiated Contact with Patient 02/21/13 1120      Chief Complaint  Patient presents with  . Hip Pain    (Consider location/radiation/quality/duration/timing/severity/associated sxs/prior treatment) HPI  62 year old female with recent left total hip replacement 9 days ago by Dr. Turner Daniels from [facility] presents complaining of acute onset of left hip pain. Patient states since the surgery she has had a great recovery period. She is undergoing physical therapy. Last night while walking towards her refrigerator patient felt a pop follows with severe pain to her left hip near incision site.pain is sharp, throbbing, 10 out of 10, nonradiating.  she did not fall however she cannot bear weight on her left hip. She took one Robaxin with some improvement. This morning the pain continued to persist, prompting patient to come to ER for further evaluation. Patient denies fever, chills, abdominal pain, back pain, numbness, weakness. Reports increasing pain with left hip movement.  Past Medical History  Diagnosis Date  . Cancer   . Breast cancer   . Lumbar stenosis   . Hyperlipidemia     takes Simvastatin daily  . Hypertension     takes Hyzaar  . History of migraine   . Arthritis   . Joint pain   . Joint swelling   . GERD (gastroesophageal reflux disease)     doesn't take any meds for this  . History of bladder infections     Past Surgical History  Procedure Laterality Date  . Double mastectomy     . Tonsillectomy    . Abdominal hysterectomy    . Hernia repair      umbilical  . Surgery for hiatal hernia    . Cholecystectomy    . Bone spur removed from left foot    . Right knee arthroscopy    . Right shoulder arthroscopy    . Colonoscopy    . Total hip arthroplasty Left 02/12/2013  . Total hip arthroplasty Left 02/12/2013    Procedure: LEFT TOTAL HIP ARTHROPLASTY;  Surgeon: Nestor Lewandowsky, MD;  Location:  [facility] OR;  Service: Orthopedics;  Laterality: Left;    Family History  Problem Relation Age of Onset  . Adopted: Yes    History  Substance Use Topics  . Smoking status: Never Smoker   . Smokeless tobacco: Never Used  . Alcohol Use: No    OB History   Grav Para Term Preterm Abortions TAB SAB Ect Mult Living                  Review of Systems  Constitutional: Negative for fever.  Musculoskeletal: Positive for myalgias and arthralgias. Negative for back pain.  Skin: Negative for rash.  Neurological: Negative for numbness.    Allergies  Codeine and Vicodin  Home Medications   Current Outpatient Rx  Name  Route  Sig  Dispense  Refill  . aspirin EC 325 MG EC tablet   Oral   Take 1 tablet (325 mg total) by mouth 2 (two) times daily.   30 tablet   0   . diphenhydrAMINE (BENADRYL) 25 MG tablet   Oral   Take 25 mg by mouth daily as needed for itching.         . losartan-hydrochlorothiazide (HYZAAR) 50-12.5 MG per tablet   Oral   Take 1 tablet by mouth daily.         . meloxicam (MOBIC)  7.5 MG tablet   Oral   Take 7.5 mg by mouth daily.         . methocarbamol (ROBAXIN) 500 MG tablet   Oral   Take 1 tablet (500 mg total) by mouth every 6 (six) hours as needed.   60 tablet   0   . oxyCODONE-acetaminophen (ROXICET) 5-325 MG per tablet   Oral   Take 1-2 tablets by mouth every 4 (four) hours as needed for pain.   60 tablet   0   . simvastatin (ZOCOR) 20 MG tablet   Oral   Take 20 mg by mouth daily.         . tamoxifen (NOLVADEX) 20 MG tablet   Oral   Take 20 mg by mouth daily.           BP 99/64  Pulse 75  Temp(Src) 99.1 F (37.3 C) (Oral)  Resp 20  SpO2 97%  Physical Exam  Nursing note and vitals reviewed. Constitutional: She is oriented to person, place, and time. She appears well-developed and well-nourished. No distress.  HENT:  Head: Atraumatic.  Eyes: Conjunctivae are normal.  Neck: Neck supple.  Abdominal: Soft. There is no  tenderness.  Musculoskeletal: She exhibits tenderness (Tenderness to posterior lateral aspects of left hip. Increasing hip pain with hip flexion, extension, and rotation. Minimal range of motion secondary to pain. No obvious deformity, edema, or bruises noted. No internal rotation/leg shorten appreciate. Dist). She exhibits no edema.  Neurological: She is alert and oriented to person, place, and time.  Skin: Skin is warm. No rash noted.  Left hip incision sites appears to be well healing, minimal tenderness on palpation, no obvious hernia or dehiscence.  Psychiatric: She has a normal mood and affect.    ED Course  Procedures (including critical care time)  11:37 AM Acute onset of L hip pain, had recent total hip replacement <10 days ago.  Pain worsening with hip movement, without obvious deformity.  No evidence of dehiscence or infection at surgical site.  Will obtain xray.  Pain medication offered, pt declined.  Due to acute onset of sxs, doubt DVT.  Distal pulses intact.    2:17 PM Xray negative for acute changes.  Pt however endorse pain and request pain medication.  Medication given.  Will have pt ambulate.  Will also consult with ortho for further care.    3:19 PM I have consulted with Dr. Yevette Edwards, who is oncall for Dr. Turner Daniels.  He recommend light duty, call and f/u in office next week for further care.    Labs Reviewed - No data to display Dg Hip Complete Left  02/21/2013   *RADIOLOGY REPORT*  Clinical Data: Left-sided hip pain.  Recent total hip replacement.  LEFT HIP - COMPLETE 2+ VIEW  Comparison: Pelvis 02/12/2013.  Findings: AP view of the pelvis and AP and lateral views of the left hip demonstrate postoperative changes of left hip total arthroplasty.  The femoral and acetabular components of the prosthesis appear to be well seated, without periprosthetic fracture or other acute abnormalities.  There is a small amount of gas in the soft tissues overlying the hip joint laterally, which  is not unexpected given the recent postoperative status of the patient.  The bony pelvic ring appears intact.  Advanced degenerative changes of osteoarthritis and potentially avascular necrosis are noted in the right hip, where there is some flattening of the superior aspect of the right femoral head and complete loss of joint space with  subchondral sclerosis, subchondral cyst formation and osteophyte formation.  Osteopenia.  IMPRESSION: 1.  Status post left total hip arthroplasty, without acute complicating features, as above. 2.  Advanced degenerative changes of osteoarthritis of the right hip joint with potential avascular necrosis of the right femoral head. 3.  Osteopenia.   Original Report Authenticated By: Trudie Reed, M.D.     1. Left hip pain       MDM  BP 115/61  Pulse 65  Temp(Src) 99.1 F (37.3 C) (Oral)  Resp 20  SpO2 96%  I have reviewed nursing notes and vital signs. I personally reviewed the imaging tests through PACS system  I reviewed available ER/hospitalization records thought the EMR         Fayrene Helper, [place] 02/21/13 1522

## 2013-02-21 NOTE — ED Notes (Addendum)
Called radiology, reports transporter is on the way to get her.

## 2013-02-21 NOTE — ED Notes (Signed)
Pt ambulated with assistance, reports she normally uses a walker at home. Pt sts she isn't walking as well as she has been. Greta Doom, PA notified.

## 2013-04-20 ENCOUNTER — Other Ambulatory Visit: Payer: Self-pay | Admitting: Orthopedic Surgery

## 2013-04-26 ENCOUNTER — Encounter (HOSPITAL_COMMUNITY): Payer: Self-pay | Admitting: Pharmacy Technician

## 2013-04-28 NOTE — H&P (Signed)
Cynthia Velasquez is an 63 y.o. female.   Chief Complaint: Severe Right Hip Pain  [name] is here today for followup on bilateral hip pain.  Recall, this patient is status post left total hip arthroplasty on 02/12/13.  She is here today stating that the left hip is doing great.  She does continue to ambulate with a walker due to her continued right hip pain.  She states the right hip is interfering with the left hip rehabilitation.  She states that she feels off balance.  She did recently receive a left hip cortisone injection intra-articularly performed by Dr. Regino Schultze on 03/30/13.  She had approximately 2 weeks of relief and then her symptoms returned.  She rates her pain today as a 9+ out of 10.  She denies any new injury.  She states she is guarded with the right hip.  She states that her current pain medication Percocet is not working.  She is here today questioning surgical intervention on the right hip.  She denies any current fevers chills night sweats or other signs of infection.  Past Medical History  Diagnosis Date  . Cancer   . Breast cancer   . Lumbar stenosis   . Hyperlipidemia     takes Simvastatin daily  . Hypertension     takes Hyzaar  . History of migraine   . Arthritis   . Joint pain   . Joint swelling   . GERD (gastroesophageal reflux disease)     doesn't take any meds for this  . History of bladder infections     Past Surgical History  Procedure Laterality Date  . Double mastectomy     . Tonsillectomy    . Abdominal hysterectomy    . Hernia repair      umbilical  . Surgery for hiatal hernia    . Cholecystectomy    . Bone spur removed from left foot    . Right knee arthroscopy    . Right shoulder arthroscopy    . Colonoscopy    . Total hip arthroplasty Left 02/12/2013  . Total hip arthroplasty Left 02/12/2013    Procedure: LEFT TOTAL HIP ARTHROPLASTY;  Surgeon: Nestor Lewandowsky, MD;  Location: [facility] OR;  Service: Orthopedics;  Laterality: Left;    Family History   Problem Relation Age of Onset  . Adopted: Yes   Social History:  reports that she has never smoked. She has never used smokeless tobacco. She reports that she does not drink alcohol or use illicit drugs.  Allergies:  Allergies  Allergen Reactions  . Codeine Itching and Rash  . Vicodin [Hydrocodone-Acetaminophen] Rash    No prescriptions prior to admission    No results found for this or any previous visit (from the past 48 hour(s)). No results found.  Review of Systems  Constitutional: Negative.   HENT: Negative.   Eyes: Negative.   Respiratory: Negative.   Cardiovascular: Negative.   Gastrointestinal: Negative.   Genitourinary: Negative.   Musculoskeletal: Positive for joint pain (right joint pain).  Skin: Negative.   Neurological: Negative.   Endo/Heme/Allergies: Negative.   Psychiatric/Behavioral: Negative.     There were no vitals taken for this visit. Physical Exam  Constitutional: She is oriented to person, place, and time. She appears well-developed and well-nourished.  HENT:  Head: Normocephalic and atraumatic.  Neck: Normal range of motion. Neck supple.  Cardiovascular: Intact distal pulses.   Respiratory: Effort normal and breath sounds normal.  Musculoskeletal: She exhibits tenderness (right hip pain).  Neurological: She is alert and oriented to person, place, and time. She has normal reflexes.  Skin: Skin is warm and dry.  Psychiatric: She has a normal mood and affect. Her behavior is normal. Judgment and thought content normal.     Assessment/Plan Assess:  Status post left total hip arthroplasty on 02/12/13 Status post right shoulder arthroscopy on 01/08/13 Stage arthritis right hip with failed conservative treatment including oral medications and intra-articular injection  Plan: This patient is discussed with Dr. Turner Daniels.  Patient has elected to continue with physical therapy on the left hip and right shoulders.  She requested surgical intervention on  the right hip.  She is made aware the benefits risks and potential complications of surgery.  She wishes to proceed with a right total hip arthroplasty.  A posting slip is completed and she is sent to see Agustin Cree for scheduling.  Patient is to followup as needed.  We will plan on doing her surgery pending clearance.  Call with any issues.  Plan to use a Pinnacle cup and S-ROM stem.  PHILLIPS, ERIC R 04/28/2013, 12:10 PM

## 2013-04-29 ENCOUNTER — Other Ambulatory Visit (HOSPITAL_COMMUNITY): Payer: BC Managed Care – PPO

## 2013-04-29 ENCOUNTER — Encounter (HOSPITAL_COMMUNITY): Payer: Self-pay

## 2013-04-29 ENCOUNTER — Encounter (HOSPITAL_COMMUNITY)
Admission: RE | Admit: 2013-04-29 | Discharge: 2013-04-29 | Disposition: A | Discharge status: Home / Self Care | Payer: BC Managed Care – PPO | Source: Ambulatory Visit | Attending: Orthopedic Surgery | Admitting: Orthopedic Surgery

## 2013-04-29 DIAGNOSIS — Z01812 Encounter for preprocedural laboratory examination: Secondary | ICD-10-CM | POA: Insufficient documentation

## 2013-04-29 DIAGNOSIS — Z01818 Encounter for other preprocedural examination: Secondary | ICD-10-CM | POA: Insufficient documentation

## 2013-04-29 HISTORY — DX: Calculus of gallbladder without cholecystitis without obstruction: K80.20

## 2013-04-29 LAB — CBC WITH DIFFERENTIAL/PLATELET
Basophils Absolute: 0 10*3/uL (ref 0.0–0.1)
Basophils Relative: 0 % (ref 0–1)
Eosinophils Absolute: 0.1 10*3/uL (ref 0.0–0.7)
Hemoglobin: 12.1 g/dL (ref 12.0–15.0)
MCH: 26.1 pg (ref 26.0–34.0)
MCHC: 33 g/dL (ref 30.0–36.0)
Neutro Abs: 4.9 10*3/uL (ref 1.7–7.7)
Neutrophils Relative %: 62 % (ref 43–77)
Platelets: 287 10*3/uL (ref 150–400)
RDW: 15.2 % (ref 11.5–15.5)

## 2013-04-29 LAB — BASIC METABOLIC PANEL
BUN: 13 mg/dL (ref 6–23)
Calcium: 10 mg/dL (ref 8.4–10.5)
Creatinine, Ser: 0.81 mg/dL (ref 0.50–1.10)
GFR calc Af Amer: 89 mL/min — ABNORMAL LOW (ref >90)
GFR calc non Af Amer: 77 mL/min — ABNORMAL LOW (ref >90)
Glucose, Bld: 99 mg/dL (ref 70–99)
Potassium: 3 mEq/L — ABNORMAL LOW (ref 3.5–5.1)

## 2013-04-29 LAB — PROTIME-INR
INR: 1.02 (ref 0.00–1.49)
Prothrombin Time: 13.2 seconds (ref 11.6–15.2)

## 2013-04-29 LAB — TYPE AND SCREEN: ABO/RH(D): O POS

## 2013-04-29 LAB — SURGICAL PCR SCREEN
MRSA, PCR: NEGATIVE
Staphylococcus aureus: NEGATIVE

## 2013-04-29 NOTE — Pre-Procedure Instructions (Signed)
Cynthia Velasquez  04/29/2013   Your procedure is scheduled on:  May 03, 2013 at 9:58 AM  Report to [facility] at 8:00 AM.  Call this number if you have problems the morning of surgery: [phone]   Remember:   Do not eat food or drink liquids after midnight.   Take these medicines the morning of surgery with A SIP OF WATER: methocarbamol (ROBAXIN), oxyCODONE-acetaminophen (ROXICET), promethazine (PHENERGAN), SUMAtriptan (IMITREX) - only if needed.  Stop all Vitamins, herbal medications, Aspirin, and Nonsteroidals (Ibuprofen, Naproxen, Advil, Motrin, Aleve) as of today, 04/29/13      Do not wear jewelry, make-up or nail polish.  Do not wear lotions, powders, or perfumes. You may wear deodorant.  Do not shave 48 hours prior to surgery.   Do not bring valuables to the hospital.  [facility] is not responsible                   for any belongings or valuables.  Contacts, dentures or bridgework may not be worn into surgery.  Leave suitcase in the car. After surgery it may be brought to your room.  For patients admitted to the hospital, checkout time is 11:00 AM the day of  discharge.     Special Instructions: Shower using CHG 2 nights before surgery and the night before surgery.  If you shower the day of surgery use CHG.  Use special wash - you have one bottle of CHG for all showers.  You should use approximately 1/3 of the bottle for each shower.   Please read over the following fact sheets that you were given: Pain Booklet, Coughing and Deep Breathing, Blood Transfusion Information, Total Joint Packet, MRSA Information and Surgical Site Infection Prevention

## 2013-05-02 MED ORDER — CEFAZOLIN SODIUM-DEXTROSE 2-3 GM-% IV SOLR
2.0000 g | INTRAVENOUS | Status: DC
  Filled 2013-05-02: qty 50

## 2013-05-03 ENCOUNTER — Inpatient Hospital Stay (HOSPITAL_COMMUNITY): Payer: BC Managed Care – PPO

## 2013-05-03 ENCOUNTER — Encounter (HOSPITAL_COMMUNITY)
Admission: RE | Disposition: A | Discharge status: Home Health | Payer: Self-pay | Source: Ambulatory Visit | Attending: Orthopedic Surgery

## 2013-05-03 ENCOUNTER — Ambulatory Visit (HOSPITAL_COMMUNITY): Payer: BC Managed Care – PPO | Admitting: Anesthesiology

## 2013-05-03 ENCOUNTER — Inpatient Hospital Stay (HOSPITAL_COMMUNITY)
Admission: RE | Admit: 2013-05-03 | Discharge: 2013-05-07 | DRG: 818 | Disposition: A | Discharge status: Home Health | Payer: BC Managed Care – PPO | Source: Ambulatory Visit | Attending: Orthopedic Surgery | Admitting: Orthopedic Surgery

## 2013-05-03 ENCOUNTER — Encounter (HOSPITAL_COMMUNITY): Payer: Self-pay | Admitting: Anesthesiology

## 2013-05-03 DIAGNOSIS — E785 Hyperlipidemia, unspecified: Secondary | ICD-10-CM | POA: Diagnosis present

## 2013-05-03 DIAGNOSIS — M1610 Unilateral primary osteoarthritis, unspecified hip: Principal | ICD-10-CM | POA: Diagnosis present

## 2013-05-03 DIAGNOSIS — I10 Essential (primary) hypertension: Secondary | ICD-10-CM | POA: Diagnosis present

## 2013-05-03 DIAGNOSIS — K219 Gastro-esophageal reflux disease without esophagitis: Secondary | ICD-10-CM | POA: Diagnosis present

## 2013-05-03 DIAGNOSIS — Z9010 Acquired absence of unspecified breast and nipple: Secondary | ICD-10-CM

## 2013-05-03 DIAGNOSIS — Z7982 Long term (current) use of aspirin: Secondary | ICD-10-CM

## 2013-05-03 DIAGNOSIS — Z79899 Other long term (current) drug therapy: Secondary | ICD-10-CM

## 2013-05-03 DIAGNOSIS — M1611 Unilateral primary osteoarthritis, right hip: Secondary | ICD-10-CM

## 2013-05-03 DIAGNOSIS — M169 Osteoarthritis of hip, unspecified: Principal | ICD-10-CM | POA: Diagnosis present

## 2013-05-03 DIAGNOSIS — Z853 Personal history of malignant neoplasm of breast: Secondary | ICD-10-CM

## 2013-05-03 DIAGNOSIS — Z9089 Acquired absence of other organs: Secondary | ICD-10-CM

## 2013-05-03 DIAGNOSIS — Z96649 Presence of unspecified artificial hip joint: Secondary | ICD-10-CM

## 2013-05-03 HISTORY — PX: TOTAL HIP ARTHROPLASTY: SHX124

## 2013-05-03 SURGERY — ARTHROPLASTY, HIP, TOTAL,POSTERIOR APPROACH
Anesthesia: General | Site: Hip | Laterality: Right | Wound class: Clean

## 2013-05-03 MED ORDER — KCL IN DEXTROSE-NACL 20-5-0.45 MEQ/L-%-% IV SOLN
INTRAVENOUS | Status: DC
  Administered 2013-05-04: 11:00:00 via INTRAVENOUS
  Filled 2013-05-03 (×10): qty 1000

## 2013-05-03 MED ORDER — HYDROMORPHONE HCL PF 1 MG/ML IJ SOLN
INTRAMUSCULAR | Status: AC
  Administered 2013-05-03: 0.5 mg via INTRAVENOUS
  Filled 2013-05-03: qty 2

## 2013-05-03 MED ORDER — SODIUM CHLORIDE 0.9 % IR SOLN
Status: DC | PRN
  Administered 2013-05-03: 1000 mL

## 2013-05-03 MED ORDER — PHENOL 1.4 % MT LIQD: 1.0000 | OROMUCOSAL | Status: DC | PRN

## 2013-05-03 MED ORDER — CEFAZOLIN SODIUM-DEXTROSE 2-3 GM-% IV SOLR
INTRAVENOUS | Status: DC | PRN
  Administered 2013-05-03: 2 g via INTRAVENOUS

## 2013-05-03 MED ORDER — OXYCODONE HCL 5 MG PO TABS: 5.0000 mg | ORAL_TABLET | Freq: Once | ORAL | Status: DC | PRN

## 2013-05-03 MED ORDER — DOCUSATE SODIUM 100 MG PO CAPS
100.0000 mg | ORAL_CAPSULE | Freq: Two times a day (BID) | ORAL | Status: DC
  Administered 2013-05-03 – 2013-05-06 (×6): 100 mg via ORAL
  Filled 2013-05-03 (×8): qty 1

## 2013-05-03 MED ORDER — ACETAMINOPHEN 325 MG PO TABS
650.0000 mg | ORAL_TABLET | Freq: Four times a day (QID) | ORAL | Status: DC | PRN
  Administered 2013-05-03: 650 mg via ORAL
  Filled 2013-05-03: qty 2

## 2013-05-03 MED ORDER — DIPHENHYDRAMINE HCL 12.5 MG/5ML PO ELIX: 12.5000 mg | ORAL_SOLUTION | ORAL | Status: DC | PRN

## 2013-05-03 MED ORDER — ROCURONIUM BROMIDE 100 MG/10ML IV SOLN
INTRAVENOUS | Status: DC | PRN
  Administered 2013-05-03: 50 mg via INTRAVENOUS

## 2013-05-03 MED ORDER — METOCLOPRAMIDE HCL 5 MG/ML IJ SOLN: 5.0000 mg | Freq: Three times a day (TID) | INTRAMUSCULAR | Status: DC | PRN

## 2013-05-03 MED ORDER — METHOCARBAMOL 100 MG/ML IJ SOLN
500.0000 mg | Freq: Four times a day (QID) | INTRAVENOUS | Status: DC | PRN
  Filled 2013-05-03: qty 5

## 2013-05-03 MED ORDER — MENTHOL 3 MG MT LOZG: 1.0000 | LOZENGE | OROMUCOSAL | Status: DC | PRN

## 2013-05-03 MED ORDER — LACTATED RINGERS IV SOLN
INTRAVENOUS | Status: DC
  Administered 2013-05-03 (×2): via INTRAVENOUS

## 2013-05-03 MED ORDER — METHOCARBAMOL 500 MG PO TABS
500.0000 mg | ORAL_TABLET | Freq: Four times a day (QID) | ORAL | Status: DC | PRN
  Administered 2013-05-03 – 2013-05-07 (×9): 500 mg via ORAL
  Filled 2013-05-03 (×9): qty 1

## 2013-05-03 MED ORDER — PROPOFOL 10 MG/ML IV BOLUS
INTRAVENOUS | Status: DC | PRN
  Administered 2013-05-03: 110 mg via INTRAVENOUS

## 2013-05-03 MED ORDER — ACETAMINOPHEN 650 MG RE SUPP: 650.0000 mg | Freq: Four times a day (QID) | RECTAL | Status: DC | PRN

## 2013-05-03 MED ORDER — HYDROMORPHONE HCL PF 1 MG/ML IJ SOLN
0.5000 mg | INTRAMUSCULAR | Status: DC | PRN
  Administered 2013-05-03 – 2013-05-07 (×10): 0.5 mg via INTRAVENOUS
  Filled 2013-05-03 (×10): qty 1

## 2013-05-03 MED ORDER — DEXTROSE-NACL 5-0.45 % IV SOLN: INTRAVENOUS | Status: DC

## 2013-05-03 MED ORDER — MAGNESIUM CITRATE PO SOLN
1.0000 | Freq: Once | ORAL | Status: AC | PRN
  Filled 2013-05-03: qty 296

## 2013-05-03 MED ORDER — FENTANYL CITRATE 0.05 MG/ML IJ SOLN
INTRAMUSCULAR | Status: DC | PRN
  Administered 2013-05-03: 100 ug via INTRAVENOUS
  Administered 2013-05-03: 150 ug via INTRAVENOUS
  Administered 2013-05-03 (×2): 100 ug via INTRAVENOUS
  Administered 2013-05-03: 50 ug via INTRAVENOUS

## 2013-05-03 MED ORDER — ASPIRIN EC 325 MG PO TBEC
325.0000 mg | DELAYED_RELEASE_TABLET | Freq: Every day | ORAL | Status: DC
  Administered 2013-05-04 – 2013-05-07 (×4): 325 mg via ORAL
  Filled 2013-05-03 (×6): qty 1

## 2013-05-03 MED ORDER — BISACODYL 5 MG PO TBEC
5.0000 mg | DELAYED_RELEASE_TABLET | Freq: Every day | ORAL | Status: DC | PRN
  Administered 2013-05-05: 5 mg via ORAL
  Filled 2013-05-03: qty 1

## 2013-05-03 MED ORDER — LOSARTAN POTASSIUM-HCTZ 50-12.5 MG PO TABS: 1.0000 | ORAL_TABLET | Freq: Two times a day (BID) | ORAL | Status: DC

## 2013-05-03 MED ORDER — MIDAZOLAM HCL 5 MG/5ML IJ SOLN
INTRAMUSCULAR | Status: DC | PRN
  Administered 2013-05-03: 2 mg via INTRAVENOUS

## 2013-05-03 MED ORDER — ALBUMIN HUMAN 5 % IV SOLN
INTRAVENOUS | Status: DC | PRN
  Administered 2013-05-03: 12:00:00 via INTRAVENOUS

## 2013-05-03 MED ORDER — OXYCODONE HCL 5 MG PO TABS: 5.0000 mg | ORAL_TABLET | ORAL | Status: DC | PRN

## 2013-05-03 MED ORDER — MEPERIDINE HCL 25 MG/ML IJ SOLN: 6.2500 mg | INTRAMUSCULAR | Status: DC | PRN

## 2013-05-03 MED ORDER — NEOSTIGMINE METHYLSULFATE 1 MG/ML IJ SOLN
INTRAMUSCULAR | Status: DC | PRN
  Administered 2013-05-03: 3 mg via INTRAVENOUS

## 2013-05-03 MED ORDER — ONDANSETRON HCL 4 MG/2ML IJ SOLN: 4.0000 mg | Freq: Four times a day (QID) | INTRAMUSCULAR | Status: DC | PRN

## 2013-05-03 MED ORDER — ONDANSETRON HCL 4 MG/2ML IJ SOLN
INTRAMUSCULAR | Status: DC | PRN
  Administered 2013-05-03: 4 mg via INTRAVENOUS

## 2013-05-03 MED ORDER — METOCLOPRAMIDE HCL 10 MG PO TABS: 5.0000 mg | ORAL_TABLET | Freq: Three times a day (TID) | ORAL | Status: DC | PRN

## 2013-05-03 MED ORDER — ONDANSETRON HCL 4 MG PO TABS: 4.0000 mg | ORAL_TABLET | Freq: Four times a day (QID) | ORAL | Status: DC | PRN

## 2013-05-03 MED ORDER — BUPIVACAINE-EPINEPHRINE (PF) 0.5% -1:200000 IJ SOLN
INTRAMUSCULAR | Status: AC
  Filled 2013-05-03: qty 10

## 2013-05-03 MED ORDER — LOSARTAN POTASSIUM 50 MG PO TABS
50.0000 mg | ORAL_TABLET | Freq: Every day | ORAL | Status: DC
Start: 2013-05-04 — End: 2013-05-07
  Administered 2013-05-04 – 2013-05-06 (×3): 50 mg via ORAL
  Filled 2013-05-03 (×4): qty 1

## 2013-05-03 MED ORDER — ONDANSETRON HCL 4 MG/2ML IJ SOLN: 4.0000 mg | Freq: Once | INTRAMUSCULAR | Status: DC | PRN

## 2013-05-03 MED ORDER — ACETAMINOPHEN 325 MG PO TABS
ORAL_TABLET | ORAL | Status: AC
  Administered 2013-05-03: 650 mg via ORAL
  Filled 2013-05-03: qty 2

## 2013-05-03 MED ORDER — SUMATRIPTAN SUCCINATE 50 MG PO TABS
50.0000 mg | ORAL_TABLET | ORAL | Status: DC | PRN
  Filled 2013-05-03: qty 1

## 2013-05-03 MED ORDER — METHOCARBAMOL 500 MG PO TABS
ORAL_TABLET | ORAL | Status: AC
  Filled 2013-05-03: qty 1

## 2013-05-03 MED ORDER — HYDROCHLOROTHIAZIDE 12.5 MG PO CAPS
12.5000 mg | ORAL_CAPSULE | Freq: Every day | ORAL | Status: DC
  Administered 2013-05-04 – 2013-05-06 (×3): 12.5 mg via ORAL
  Filled 2013-05-03 (×4): qty 1

## 2013-05-03 MED ORDER — CHLORHEXIDINE GLUCONATE 4 % EX LIQD: 60.0000 mL | Freq: Once | CUTANEOUS | Status: DC

## 2013-05-03 MED ORDER — GLYCOPYRROLATE 0.2 MG/ML IJ SOLN
INTRAMUSCULAR | Status: DC | PRN
  Administered 2013-05-03: .4 mg via INTRAVENOUS

## 2013-05-03 MED ORDER — LIDOCAINE HCL (CARDIAC) 20 MG/ML IV SOLN
INTRAVENOUS | Status: DC | PRN
  Administered 2013-05-03: 100 mg via INTRAVENOUS

## 2013-05-03 MED ORDER — SENNOSIDES-DOCUSATE SODIUM 8.6-50 MG PO TABS
1.0000 | ORAL_TABLET | Freq: Every evening | ORAL | Status: DC | PRN
  Administered 2013-05-05: 1 via ORAL
  Filled 2013-05-03: qty 1

## 2013-05-03 MED ORDER — BUPIVACAINE-EPINEPHRINE 0.5% -1:200000 IJ SOLN
INTRAMUSCULAR | Status: DC | PRN
  Administered 2013-05-03: 30 mL

## 2013-05-03 MED ORDER — HYDROMORPHONE HCL PF 1 MG/ML IJ SOLN
0.2500 mg | INTRAMUSCULAR | Status: DC | PRN
  Administered 2013-05-03 (×3): 0.5 mg via INTRAVENOUS

## 2013-05-03 MED ORDER — OXYCODONE HCL 5 MG/5ML PO SOLN: 5.0000 mg | Freq: Once | ORAL | Status: DC | PRN

## 2013-05-03 SURGICAL SUPPLY — 56 items
BLADE SAW SAG 73X25 THK (BLADE) ×1
BLADE SAW SGTL 18X1.27X75 (BLADE) IMPLANT
BLADE SAW SGTL 73X25 THK (BLADE) ×1 IMPLANT
BLADE SAW SGTL MED 73X18.5 STR (BLADE) IMPLANT
BRUSH FEMORAL CANAL (MISCELLANEOUS) IMPLANT
CAPT HIP PF COP ×2 IMPLANT
CLOTH BEACON ORANGE TIMEOUT ST (SAFETY) ×2 IMPLANT
COVER BACK TABLE 24X17X13 BIG (DRAPES) IMPLANT
COVER SURGICAL LIGHT HANDLE (MISCELLANEOUS) ×4 IMPLANT
DRAPE ORTHO SPLIT 77X108 STRL (DRAPES) ×1
DRAPE PROXIMA HALF (DRAPES) ×2 IMPLANT
DRAPE SURG ORHT 6 SPLT 77X108 (DRAPES) ×1 IMPLANT
DRAPE U-SHAPE 47X51 STRL (DRAPES) ×2 IMPLANT
DRILL BIT 7/64X5 (BIT) ×2 IMPLANT
DRSG AQUACEL AG ADV 3.5X10 (GAUZE/BANDAGES/DRESSINGS) ×4 IMPLANT
DRSG MEPILEX BORDER 4X12 (GAUZE/BANDAGES/DRESSINGS) ×2 IMPLANT
DRSG MEPILEX BORDER 4X8 (GAUZE/BANDAGES/DRESSINGS) ×2 IMPLANT
DURAPREP 26ML APPLICATOR (WOUND CARE) ×2 IMPLANT
ELECT BLADE 4.0 EZ CLEAN MEGAD (MISCELLANEOUS)
ELECT REM PT RETURN 9FT ADLT (ELECTROSURGICAL) ×2
ELECTRODE BLDE 4.0 EZ CLN MEGD (MISCELLANEOUS) IMPLANT
ELECTRODE REM PT RTRN 9FT ADLT (ELECTROSURGICAL) ×1 IMPLANT
GAUZE XEROFORM 1X8 LF (GAUZE/BANDAGES/DRESSINGS) ×2 IMPLANT
GLOVE BIO SURGEON STRL SZ7.5 (GLOVE) ×2 IMPLANT
GLOVE BIO SURGEON STRL SZ8.5 (GLOVE) ×4 IMPLANT
GLOVE BIOGEL PI IND STRL 8 (GLOVE) ×2 IMPLANT
GLOVE BIOGEL PI IND STRL 9 (GLOVE) ×1 IMPLANT
GLOVE BIOGEL PI INDICATOR 8 (GLOVE) ×2
GLOVE BIOGEL PI INDICATOR 9 (GLOVE) ×1
GOWN PREVENTION PLUS XLARGE (GOWN DISPOSABLE) ×2 IMPLANT
GOWN STRL NON-REIN LRG LVL3 (GOWN DISPOSABLE) ×4 IMPLANT
GOWN STRL REIN XL XLG (GOWN DISPOSABLE) ×4 IMPLANT
HANDPIECE INTERPULSE COAX TIP (DISPOSABLE)
HOOD PEEL AWAY FACE SHEILD DIS (HOOD) ×4 IMPLANT
KIT BASIN OR (CUSTOM PROCEDURE TRAY) ×2 IMPLANT
KIT ROOM TURNOVER OR (KITS) ×2 IMPLANT
MANIFOLD NEPTUNE II (INSTRUMENTS) ×2 IMPLANT
NEEDLE 22X1 1/2 (OR ONLY) (NEEDLE) ×2 IMPLANT
NS IRRIG 1000ML POUR BTL (IV SOLUTION) ×2 IMPLANT
PACK TOTAL JOINT (CUSTOM PROCEDURE TRAY) ×2 IMPLANT
PAD ARMBOARD 7.5X6 YLW CONV (MISCELLANEOUS) ×4 IMPLANT
PASSER SUT SWANSON 36MM LOOP (INSTRUMENTS) ×2 IMPLANT
PRESSURIZER FEMORAL UNIV (MISCELLANEOUS) IMPLANT
SET HNDPC FAN SPRY TIP SCT (DISPOSABLE) IMPLANT
SUT ETHIBOND 2 V 37 (SUTURE) ×2 IMPLANT
SUT ETHILON 3 0 FSL (SUTURE) ×2 IMPLANT
SUT VIC AB 0 CTB1 27 (SUTURE) ×2 IMPLANT
SUT VIC AB 1 CTX 36 (SUTURE) ×1
SUT VIC AB 1 CTX36XBRD ANBCTR (SUTURE) ×1 IMPLANT
SUT VIC AB 2-0 CTB1 (SUTURE) ×2 IMPLANT
SYR CONTROL 10ML LL (SYRINGE) ×2 IMPLANT
TOWEL OR 17X24 6PK STRL BLUE (TOWEL DISPOSABLE) ×2 IMPLANT
TOWEL OR 17X26 10 PK STRL BLUE (TOWEL DISPOSABLE) ×2 IMPLANT
TOWER CARTRIDGE SMART MIX (DISPOSABLE) IMPLANT
TRAY FOLEY CATH 16FRSI W/METER (SET/KITS/TRAYS/PACK) ×2 IMPLANT
WATER STERILE IRR 1000ML POUR (IV SOLUTION) ×6 IMPLANT

## 2013-05-03 NOTE — Op Note (Addendum)
Note done previously  

## 2013-05-03 NOTE — Anesthesia Procedure Notes (Signed)
Procedure Name: Intubation Date/Time: 05/03/2013 10:36 AM Performed by: Marena Chancy Pre-anesthesia Checklist: Emergency Drugs available, Patient identified, Timeout performed, Suction available and Patient being monitored Patient Re-evaluated:Patient Re-evaluated prior to inductionOxygen Delivery Method: Circle system utilized Preoxygenation: Pre-oxygenation with 100% oxygen Intubation Type: IV induction Ventilation: Mask ventilation without difficulty and Oral airway inserted - appropriate to patient size Tube type: Oral Tube size: 7.5 mm Number of attempts: 1 Airway Equipment and Method: Video-laryngoscopy Placement Confirmation: breath sounds checked- equal and bilateral and positive ETCO2 Secured at: 23 cm Tube secured with: Tape Dental Injury: Teeth and Oropharynx as per pre-operative assessment  Difficulty Due To: Difficulty was unanticipated and Difficult Airway- due to limited oral opening Future Recommendations: Recommend- induction with short-acting agent, and alternative techniques readily available

## 2013-05-03 NOTE — Op Note (Addendum)
OPERATIVE REPORT    DATE OF PROCEDURE:  05/03/2013       PREOPERATIVE DIAGNOSIS:  RIGHT HIP OSTEOARTHRITIS                                                          POSTOPERATIVE DIAGNOSIS:  Right Hip Osteoarthritis                                                           PROCEDURE:  R total hip arthroplasty using a 52 mm DePuy Pinnacle  Cup, [employer], 10-degree polyethylene liner index superior  and posterior, a +0 36 mm ceramic head, a 20x15x42x160 SROM stem, 18Dsm Sleeve   SURGEON: Jakeya Gherardi J    ASSISTANT:   Eric K. [employer]  (present throughout entire procedure and necessary for timely completion of the procedure)   ANESTHESIA: General BLOOD LOSS: 500 FLUID REPLACEMENT: 1800 crystalloid DRAINS: Foley Catheter URINE OUTPUT: 300cc COMPLICATIONS: none    INDICATIONS FOR PROCEDURE: A 62 y.o. year-old With  RIGHT HIP OSTEOARTHRITIS   for 3 years, x-rays show bone-on-bone arthritic changes. Despite conservative measures with observation, anti-inflammatory medicine, narcotics, use of a cane, has severe unremitting pain and can ambulate only a few blocks before resting.  Patient desires elective R total hip arthroplasty to decrease pain and increase function. The risks, benefits, and alternatives were discussed at length including but not limited to the risks of infection, bleeding, nerve injury, stiffness, blood clots, the need for revision surgery, cardiopulmonary complications, among others, and they were willing to proceed. Questions answered     PROCEDURE IN DETAIL: The patient was identified by armband,  received preoperative IV antibiotics in the holding area at [facility], taken to the operating room , appropriate anesthetic monitors  were attached and general endotracheal anesthesia induced. Foley catheter was inserted. Pt was rolled into the L lateral decubitus position and fixed there with a Stulberg Mark II pelvic clamp.  The R lower extremity was  then prepped and draped  in the usual sterile fashion from the ankle to the hemipelvis. A time-out  procedure was performed. The skin along the lateral hip and thigh  infiltrated with 10 mL of 0.5% Marcaine and epinephrine solution. We  then made a posterolateral approach to the hip. With a #10 blade, a 24 cm  incision was made through the skin and subcutaneous tissue down to the level of the  IT band. Small bleeders were identified and cauterized. The IT band was cut in  line with skin incision exposing the greater trochanter. A Cobra retractor was placed between the gluteus minimus and the superior hip joint capsule, and a spiked Cobra between the quadratus femoris and the inferior hip joint capsule. This isolated the short  external rotators and piriformis tendons. These were tagged with a #2 Ethibond  suture and cut off their insertion on the intertrochanteric crest. The posterior  capsule was then developed into an acetabular-based flap from Posterior Superior off of the acetabulum out over the femoral neck and back posterior inferior to the acetabular rim. This flap was tagged with two #2 Ethibond  sutures and retracted protecting the sciatic nerve. This exposed the arthritic femoral head and osteophytes. The hip was then flexed and internally rotated, dislocating the femoral head and a standard neck cut performed 1 fingerbreadth above the lesser trochanter.  A spiked Cobra was placed in the cotyloid notch and a Hohmann retractor was then used to lever the femur anteriorly off of the anterior pelvic column. A posterior-inferior wing retractor was placed at the junction of the acetabulum and the ischium completing the acetabular exposure.We then removed the peripheral osteophytes and labrum from the acetabulum. We then reamed the acetabulum up to 51 mm with basket reamers obtaining good coverage in all quadrants. We then irrigated with normal  saline solution and hammered into place a 52 mm pinnacle  cup in 45  degrees of abduction and about 20 degrees of anteversion. More  peripheral osteophytes removed and a trial 10-degree liner placed with the  index superior-posterior. The hip was then flexed and internally rotated exposing the  proximal femur, which was entered with the initiating reamer followed by  the axial reamers up to a 15.5 mm full depth and 16mm partial depth. We then conically reamed to 18D to the correct depth for a 42 base neck. The calcar was milled to 18Dsm. A trial cone and stem was inserted in the 25 degrees anteversion, with a +0 36mm trial head. Trial reduction was then performed and excellent stability was noted with at 90 of flexion with 75 of internal rotation and then full extension with maximal external rotation. The hip could not be dislocated in full extension. The knee could easily flex  to about 130 degrees. We also stretched the abductors at this point,  because of the preexisting adductor contractures. All trial components  were then removed. The acetabulum was irrigated out with normal saline  solution. A titanium [facility] was then screwed into place  followed by a 10-degree polyethylene liner index superior-posterior. On  the femoral side a 18Dsm ZTT1 sleeve was hammered into place, followed by a [id number] SROM stem in 25 degrees of anteversion. At this point, a +0 36 mm ceramic head was  hammered on the stem. The hip was reduced. We checked our stability  one more time and found it to be excellent. The wound was once again  thoroughly irrigated out with normal saline solution pulse lavage. The  capsular flap and short external rotators were repaired back to the  intertrochanteric crest through drill holes with a #2 Ethibond suture.  The IT band was closed with running 1 Vicryl suture. The subcutaneous  tissue with 0 and 2-0 undyed Vicryl suture and the skin with running  interlocking 3-0 nylon suture. Dressing of Xeroform and Mepilex was   then applied. The patient was then unclamped, rolled supine, awaken extubated and taken to recovery room without difficulty in stable condition.   Berley Gambrell J 05/03/2013, 11:59 AM

## 2013-05-03 NOTE — Anesthesia Preprocedure Evaluation (Addendum)
Anesthesia Evaluation  Patient identified by MRN, date of birth, ID band Patient awake    Reviewed: Allergy & Precautions, H&P , NPO status , Patient's Chart, lab work & pertinent test results  Airway Mallampati: III TM Distance: <3 FB Neck ROM: Full    Dental   Pulmonary          Cardiovascular hypertension, Pt. on medications     Neuro/Psych    GI/Hepatic GERD-  Medicated and Controlled,  Endo/Other    Renal/GU      Musculoskeletal   Abdominal   Peds  Hematology   Anesthesia Other Findings   Reproductive/Obstetrics                          Anesthesia Physical Anesthesia Plan  ASA: II  Anesthesia Plan: General   Post-op Pain Management:    Induction: Intravenous  Airway Management Planned: Oral ETT  Additional Equipment:   Intra-op Plan:   Post-operative Plan: Extubation in OR  Informed Consent: I have reviewed the patients History and Physical, chart, labs and discussed the procedure including the risks, benefits and alternatives for the proposed anesthesia with the patient or authorized representative who has indicated his/her understanding and acceptance.   Dental advisory given  Plan Discussed with: CRNA and Surgeon  Anesthesia Plan Comments:        Anesthesia Quick Evaluation

## 2013-05-03 NOTE — Interval H&P Note (Signed)
History and Physical Interval Note:  05/03/2013 9:36 AM  Cynthia Velasquez Cynthia Velasquez  has presented today for surgery, with the diagnosis of RIGHT HIP OSTEOARTHRITIS  The various methods of treatment have been discussed with the patient and family. After consideration of risks, benefits and other options for treatment, the patient has consented to  Procedure(s): TOTAL HIP ARTHROPLASTY (Right) as a surgical intervention .  The patient's history has been reviewed, patient examined, no change in status, stable for surgery.  I have reviewed the patient's chart and labs.  Questions were answered to the patient's satisfaction.     Nestor Lewandowsky

## 2013-05-03 NOTE — Preoperative (Signed)
Beta Blockers   Reason not to administer Beta Blockers:Not Applicable 

## 2013-05-03 NOTE — Anesthesia Postprocedure Evaluation (Signed)
Anesthesia Post Note  [name]  Procedure(s) Performed: Procedure(s) (LRB): TOTAL HIP ARTHROPLASTY (Right)  Anesthesia type: general  Patient location: PACU  Post pain: Pain level controlled  Post assessment: Patient's Cardiovascular Status Stable  Last Vitals:  Filed Vitals:   05/03/13 1330  BP: 152/84  Pulse:   Temp:   Resp:     Post vital signs: Reviewed and stable  Level of consciousness: sedated  Complications: No apparent anesthesia complications

## 2013-05-03 NOTE — Transfer of Care (Signed)
Immediate Anesthesia Transfer of Care Note  [name]  Procedure(s) Performed: Procedure(s): TOTAL HIP ARTHROPLASTY (Right)  Patient Location: PACU  Anesthesia Type:General  Level of Consciousness: awake, alert  and oriented  Airway & Oxygen Therapy: Patient Spontanous Breathing and Patient connected to nasal cannula oxygen  Post-op Assessment: Report given to PACU RN and Post -op Vital signs reviewed and stable  Post vital signs: Reviewed and stable  Complications: No apparent anesthesia complications

## 2013-05-03 NOTE — Plan of Care (Signed)
Problem: Consults Goal: Diagnosis- Total Joint Replacement Primary Total Hip Right     

## 2013-05-03 NOTE — Progress Notes (Signed)
PT Cancellation Note  Patient Details Name: Cynthia Velasquez MRN: [id number] DOB: 07-Nov-1950   Cancelled Treatment:    Reason Eval/Treat Not Completed: Pain limiting ability to participate.  Attempted to see patient x2 today (16:45 and 18:38).  Provided encouragement to participate.  Patient declined PT on both occasions due to pain.  Will return in am for PT eval.   Vena Austria 05/03/2013, 6:43 PM Durenda Hurt. Renaldo Fiddler, [facility] Acute Rehab Services Pager [phone]

## 2013-05-04 ENCOUNTER — Encounter (HOSPITAL_COMMUNITY): Payer: Self-pay | Admitting: Orthopedic Surgery

## 2013-05-04 LAB — BASIC METABOLIC PANEL
Chloride: 101 mEq/L (ref 96–112)
GFR calc Af Amer: 86 mL/min — ABNORMAL LOW (ref >90)
GFR calc non Af Amer: 75 mL/min — ABNORMAL LOW (ref >90)
Glucose, Bld: 126 mg/dL — ABNORMAL HIGH (ref 70–99)
Potassium: 3.5 mEq/L (ref 3.5–5.1)
Sodium: 138 mEq/L (ref 135–145)

## 2013-05-04 LAB — CBC
HCT: 31.2 % — ABNORMAL LOW (ref 36.0–46.0)
Hemoglobin: 10.1 g/dL — ABNORMAL LOW (ref 12.0–15.0)
MCHC: 32.4 g/dL (ref 30.0–36.0)
WBC: 9.9 10*3/uL (ref 4.0–10.5)

## 2013-05-04 MED ORDER — WHITE PETROLATUM GEL
Status: AC
  Administered 2013-05-04: 0.2
  Filled 2013-05-04: qty 5

## 2013-05-04 NOTE — Evaluation (Signed)
Physical Therapy Evaluation Patient Details Name: Cynthia Velasquez MRN: [id number] DOB: 17-Oct-1950 Today's Date: 05/04/2013 Time: 0940-1030 PT Time Calculation (min): 50 min  PT Assessment / Plan / Recommendation History of Present Illness  s/p right THA  Clinical Impression  Pt presents with moderate to severe limitation to functional mobility following second THA this year; pt states pain this time "is worse that before" and wondering why "this surgery is so different" than previous surgery.  Able to tolerate minimal activity to transfer to chair with moderate physical assist and increased time due to pain.  Expect pt to be able to d/c home with family's assist based on previous surgery outcome, however should not rule out SNF until more progress demonstrated.    PT Assessment  Patient needs continued PT services    Follow Up Recommendations  Home health PT;Supervision/Assistance - 24 hour    Does the patient have the potential to tolerate intense rehabilitation      Barriers to Discharge        Equipment Recommendations  None recommended by PT    Recommendations for Other Services     Frequency 7X/week    Precautions / Restrictions Precautions Precautions: Posterior Hip Precaution Booklet Issued: Yes (comment) Precaution Comments: provided written handout of typical post op hip exercises and posterior hip precautions. Pt able to state with minimal cues all 3 posterior hip precautions from previous surgery in May and pt instructed to turn to left (not right) to avoid pivoting on right limb Restrictions Weight Bearing Restrictions: Yes RLE Weight Bearing: Weight bearing as tolerated   Pertinent Vitals/Pain Unrated pain, better than earlier this morning, premedicated      Mobility  Bed Mobility Bed Mobility: Supine to Sit;Sitting - Scoot to Edge of Bed Supine to Sit: 3: Mod assist;HOB elevated (HOB >45 degrees and raised over time ) Sitting - Scoot to Edge of Bed: 2: Max  assist (bed pad to assist hip) Details for Bed Mobility Assistance: Pt requires HOB up at this point to maximize pain control in Right hip; very slow with multiple short rest breaks to LEFT EOB; bed pad to assist transitioning hip out to EOB to allow RIGHT foot to reach floor. Heavy use of rail and trunk supported with raised HOB Transfers Transfers: Sit to Stand;Stand to Sit;Stand Pivot Transfers Sit to Stand: 3: Mod assist;With upper extremity assist;From bed;From elevated surface (tolerates very gradual elevation of bed to assist into stand) Stand to Sit: 3: Mod assist;To chair/3-in-1 (pillows in chair seat) Stand Pivot Transfers: 4: Min assist (with RW) Details for Transfer Assistance: Pt needs multiple attempts to position self optimally with moderate cues and encouragement; instructional/demonstrational cues for hand placement options for maximal leverage; instructional cues to shift to left and allow left LE/arms take majority of weight when transitioning to standing; gradual increase in bed height for maximal leverage but pt only able to tolerate height where RIGHT foot is still supported on floor due to pain in hip; physical assist to achieve lift off and to safely transition both hands to walker with noticable ability to reposition right foot unassisted; once in standing instructional cues for sequencing with RW to pivot 90 degrees to left to align with recliner; instructional cues for safest hands and leg position prior to sitting and physical assist to position RIGHT foot out to minimize loadbearing and flexion into sitting.  ONce in chair, short rest break then pt able to shift hips posterior and PT elevate leg rest/reclined seat back to pt  comfort;  EDUCATED pt and RN to position chair with bed to left when pt ready to transfer back to bed.    Exercises Total Joint Exercises Ankle Circles/Pumps: AROM;Both;10 reps;Seated Quad Sets: AROM;Both;10 reps;Seated Short Arc Quad: AAROM;Right;10  reps;Seated   PT Diagnosis: Difficulty walking;Acute pain  PT Problem List: Decreased strength;Pain;Obesity;Decreased mobility;Decreased activity tolerance;Decreased range of motion PT Treatment Interventions: Modalities;Patient/family education;Therapeutic exercise;Therapeutic activities;Functional mobility training;Stair training;Gait training;DME instruction     PT Goals(Current goals can be found in the care plan section) Acute Rehab PT Goals Patient Stated Goal: no more pain PT Goal Formulation: With patient Time For Goal Achievement: 05/11/13 Potential to Achieve Goals: Good  Visit Information  Last PT Received On: 05/04/13 Assistance Needed: +1 History of Present Illness: s/p right THA       Prior Functioning  Home Living Family/patient expects to be discharged to:: Private residence Living Arrangements: Children Available Help at Discharge: Family;Available 24 hours/day Type of Home: Apartment Home Access: Stairs to enter Entrance Stairs-Number of Steps: 1 (small curb or threhold height step) Entrance Stairs-Rails: None Home Layout: One level Home Equipment: Environmental consultant - 2 wheels Prior Function Level of Independence: Independent Comments: s/p LEFT THA in May 2014 Communication Communication: No difficulties Dominant Hand: Right    Cognition  Cognition Arousal/Alertness: Awake/alert Behavior During Therapy: WFL for tasks assessed/performed Overall Cognitive Status: Within Functional Limits for tasks assessed    Extremity/Trunk Assessment Upper Extremity Assessment Upper Extremity Assessment: Overall WFL for tasks assessed (will need UE strengthening for maximal RW benefit/use) Lower Extremity Assessment Lower Extremity Assessment: RLE deficits/detail RLE Deficits / Details: posterior hip precautions; Ankle >4/5; knee 2(+) to 3(-)/5, hip not tested RLE: Unable to fully assess due to pain   Balance    End of Session PT - End of Session Equipment Utilized During  Treatment: Gait belt Activity Tolerance: Patient limited by pain (premedicated but still painful) Patient left: in chair;with call bell/phone within reach;with nursing/sitter in room Nurse Communication: Mobility status;Precautions;Weight bearing status  GP     Dennis Bast 05/04/2013, 10:41 AM

## 2013-05-04 NOTE — Progress Notes (Signed)
UR COMPLETED  

## 2013-05-04 NOTE — Progress Notes (Signed)
Patient ID: Cynthia Velasquez, female   DOB: August 17, 1951, 62 y.o.   MRN: [id number] PATIENT ID: Cynthia Velasquez  MRN: [id number]  DOB/AGE:  01-Nov-1950 / 62 y.o.  1 Day Post-Op Procedure(s) (LRB): TOTAL HIP ARTHROPLASTY (Right)    PROGRESS NOTE Subjective: Patient is alert, oriented,no Nausea, no Vomiting, yes passing gas, no Bowel Movement. Taking PO well. Denies SOB, Chest or Calf Pain. Using Incentive Spirometer, PAS in place. Ambulate WBAT Patient reports pain as 6 on 0-10 scale  .    Objective: Vital signs in last 24 hours: Filed Vitals:   05/03/13 1511 05/03/13 2052 05/04/13 0215 05/04/13 0623  BP: 120/59 139/76 114/56 106/63  Pulse: 53 93 97 89  Temp: 97.6 F (36.4 C) 100 F (37.8 C) 100 F (37.8 C) 99.3 F (37.4 C)  TempSrc:      Resp: 14 16 16 16   SpO2: 100% 100% 97% 97%      Intake/Output from previous day: I/O last 3 completed shifts: In: 2025 [P.O.:100; I.V.:1300; Other:375; IV Piggyback:250] Out: 2050 [Urine:1550; Blood:500]   Intake/Output this shift:     LABORATORY DATA:  Recent Labs  05/04/13 0430  WBC 9.9  HGB 10.1*  HCT 31.2*  PLT 249  NA 138  K 3.5  CL 101  CO2 28  BUN 8  CREATININE 0.83  GLUCOSE 126*  CALCIUM 8.5    Examination: Neurologically intact ABD soft Neurovascular intact Sensation intact distally Intact pulses distally Dorsiflexion/Plantar flexion intact Incision: no drainage No cellulitis present Compartment soft} XR AP&Lat of hip shows well placed\fixed THA  Assessment:   1 Day Post-Op Procedure(s) (LRB): TOTAL HIP ARTHROPLASTY (Right) ADDITIONAL DIAGNOSIS:  Hypertension  Plan: PT/OT WBAT, THA  posterior precautions  DVT Prophylaxis: SCDx72 hrs, ASA 325 mg BID x 2 weeks  DISCHARGE PLAN: Home, when patient passes physical therapy, this is her second total hip this year.  DISCHARGE NEEDS: HHPT, HHRN, CPM, Walker and 3-in-1 comode seat

## 2013-05-04 NOTE — Evaluation (Addendum)
Occupational Therapy Evaluation Patient Details Name: Cynthia Velasquez MRN: [id number] DOB: 01-Mar-1951 Today's Date: 05/04/2013 Time: 5784-6962 OT Time Calculation (min): 42 min  OT Assessment / Plan / Recommendation History of present illness s/p right THA   Clinical Impression   Pt presents with below problem list. Pt limited by pain in session. Pt had previous hip surgery in May. Pt will not have 24/7 supervision/assist available at home. OT spoke with pt about additional rehab and pt is open to idea. Recommending SNF for d/c pending patient's progress.     OT Assessment  Patient needs continued OT Services    Follow Up Recommendations  SNF;Supervision/Assistance - 24 hour;Home health OT    Barriers to Discharge      Equipment Recommendations  None recommended by OT    Recommendations for Other Services    Frequency  Min 2X/week    Precautions / Restrictions Precautions Precautions: Posterior Hip Precaution Booklet Issued: No Precaution Comments: Reviewed hip precautions with patient. Cues to remember precautions. Restrictions Weight Bearing Restrictions: Yes RLE Weight Bearing: Weight bearing as tolerated   Pertinent Vitals/Pain Pain 10/10 in hip. Repositioned. Nurse notified for pain meds.     ADL  Eating/Feeding: Independent Where Assessed - Eating/Feeding: Chair Grooming: Performed;Teeth care;Set up;Supervision/safety Where Assessed - Grooming: Supported sitting Upper Body Bathing: Set up;Supervision/safety Where Assessed - Upper Body Bathing: Supported sitting Lower Body Bathing: Moderate assistance Where Assessed - Lower Body Bathing: Supported sit to stand Upper Body Dressing: Set up;Supervision/safety Where Assessed - Upper Body Dressing: Supported sitting Lower Body Dressing: Moderate assistance Where Assessed - Lower Body Dressing: Supported sit to Pharmacist, hospital: Performed;Minimal assistance;Min guard (Min guard for stand pivot transfer; Min A for  sit to stand from [facility]) Toilet Transfer Method: Sit to stand;Stand pivot Toilet Transfer Equipment: Raised toilet seat with arms (or 3-in-1 over toilet) Toileting - Clothing Manipulation and Hygiene: Performed;Min guard Where Assessed - Engineer, mining and Hygiene: Sit to stand from 3-in-1 or toilet Tub/Shower Transfer Method: Not assessed Equipment Used: Gait belt;Rolling walker Transfers/Ambulation Related to ADLs: Min guard for stand pivot transfer and also to ambulate to chair. Cues to mantain precautions and also for correct gait technique. Mod A for sit to stand from bed, Min A for sit to stand from [facility].  Min A for stand to sit transfers. ADL Comments: Pt limited by pain. Pt performed stand pivot transfer from bed to [facility]. Pt able to perform hygiene and clothing management of gown with min guard assist and cues to be sure to bend over any further (Slight LOB, but pt able to self-correct). Pt brushed teeth sitting in chair.  Pt overall Mod A level for LB ADLs without AE, however verbalizes she knows how to use all equipment from previous surgery.  Briefly discussed tub transfers and talked about how a chair can be used to back up to it and swing legs in and also a tub bench, but pt states this will not work in her bathroom/tub. Educated pt on chair push ups to help increase UB strength.   OT Diagnosis: Acute pain  OT Problem List: Decreased activity tolerance;Decreased range of motion;Decreased strength;Impaired balance (sitting and/or standing);Decreased knowledge of use of DME or AE;Decreased knowledge of precautions;Pain OT Treatment Interventions: Self-care/ADL training;Therapeutic exercise;DME and/or AE instruction;Patient/family education;Balance training;Therapeutic activities   OT Goals(Current goals can be found in the care plan section) Acute Rehab OT Goals Patient Stated Goal: to go home OT Goal Formulation: With patient Time For Goal Achievement: 05/11/13  Potential  to Achieve Goals: Good ADL Goals Pt Will Transfer to Toilet: with modified independence;ambulating (3 in 1 over commode) Pt Will Perform Toileting - Clothing Manipulation and hygiene: with modified independence;sit to/from stand Additional ADL Goal #1: Pt will be independent in HEP of BUE's to increase strength.  Visit Information  Last OT Received On: 05/04/13 Assistance Needed: +1 History of Present Illness: s/p right THA       Prior Functioning     Home Living Family/patient expects to be discharged to:: Private residence Living Arrangements: Children Available Help at Discharge: Family;Available PRN/intermittently Type of Home: Apartment Home Access: Stairs to enter Entrance Stairs-Number of Steps: 1 (small curb or threshold height step) Entrance Stairs-Rails: None Home Layout: One level Home Equipment: Walker - 2 wheels;Bedside commode Prior Function Level of Independence: Independent Comments: s/p LEFT THA in May 2014 Communication Communication: No difficulties Dominant Hand: Right         Vision/Perception     Cognition  Cognition Arousal/Alertness: Awake/alert Behavior During Therapy: WFL for tasks assessed/performed Overall Cognitive Status: Within Functional Limits for tasks assessed    Extremity/Trunk Assessment Upper Extremity Assessment Upper Extremity Assessment: Overall WFL for tasks assessed (reports shoulders hurting while ambulating with walker and reports UE's are weak; had previous right shoulder surgery)     Mobility Bed Mobility Bed Mobility: Supine to Sit;Sitting - Scoot to Edge of Bed Supine to Sit: 3: Mod assist;HOB flat Sitting - Scoot to Edge of Bed: 4: Min assist Details for Bed Mobility Assistance: Assist to elevate trunk from supine to sit and also assisted with RLE. HOB elevated. Bed pad used to help scoot to EOB.  Transfers Transfers: Sit to Stand;Stand to Sit Sit to Stand: 3: Mod assist;With upper extremity assist;From bed;4:  Min assist;From chair/3-in-1 Stand to Sit: 4: Min assist;With upper extremity assist;To chair/3-in-1 Details for Transfer Assistance: Mod A for sit to stand from bed. Min A for sit to stand from [facility]. Min A for stand to sit transfers- assist with positioning RLE.      Exercise     Balance     End of Session OT - End of Session Equipment Utilized During Treatment: Gait belt;Rolling walker Activity Tolerance: Patient limited by pain Patient left: in chair;with call bell/phone within reach Nurse Communication: Patient requests pain meds  GO     Earlie Raveling OTR/L [phone] 05/04/2013, 5:47 PM

## 2013-05-04 NOTE — Progress Notes (Signed)
05/04/13 HHPT, OT and RN ordered. Spoke with patient about HHC, she chose [facility] which she worked with in past. Nutritional therapist with [facility] and set up HHPT, HHOT and RN. [employer] will provide patient with a rolling walker if she does not already have one. No other equipment needs identified. Jacquelynn Cree Rn, BSN, CCM

## 2013-05-05 LAB — CBC
HCT: 29.8 % — ABNORMAL LOW (ref 36.0–46.0)
Hemoglobin: 9.8 g/dL — ABNORMAL LOW (ref 12.0–15.0)
MCV: 78 fL (ref 78.0–100.0)
WBC: 14.5 10*3/uL — ABNORMAL HIGH (ref 4.0–10.5)

## 2013-05-05 MED ORDER — METHOCARBAMOL 500 MG PO TABS: 500.0000 mg | ORAL_TABLET | Freq: Two times a day (BID) | ORAL | Status: DC

## 2013-05-05 MED ORDER — ASPIRIN EC 325 MG PO TBEC: 325.0000 mg | DELAYED_RELEASE_TABLET | Freq: Two times a day (BID) | ORAL | Status: DC

## 2013-05-05 MED ORDER — OXYCODONE-ACETAMINOPHEN 5-325 MG PO TABS: 1.0000 | ORAL_TABLET | ORAL | Status: DC | PRN

## 2013-05-05 NOTE — Progress Notes (Signed)
Physical Therapy Treatment Patient Details Name: Cynthia Velasquez MRN: [id number] DOB: 1951/04/08 Today's Date: 05/05/2013 Time: 4098-1191 PT Time Calculation (min): 25 min  PT Assessment / Plan / Recommendation  History of Present Illness s/p right THA   PT Comments   Pt's progress has been slower than anticipated due to continued difficulties with bowel function and pt has not been able to advance ambulation or bed mobility independence.  Walked outside room for first time today, continues to depend on elevated/adjustable bed to transition to EOB.  Pt will not have 24 hour assist at d/c and has safety/independence concerns prompting need for short term SNF for continued rehab prior to d/c home.  Pt agrees with this plan and has been communicated to CSW to assist with bed search.  Feel pt needs additional 1-2 days acute in any case to be assure medical stability.     Follow Up Recommendations  SNF (does not have 24 hour assist )     Does the patient have the potential to tolerate intense rehabilitation     Barriers to Discharge        Equipment Recommendations  None recommended by PT    Recommendations for Other Services    Frequency 7X/week   Progress towards PT Goals Progress towards PT goals: Progressing toward goals  Plan Discharge plan needs to be updated    Precautions / Restrictions Precautions Precautions: Posterior Hip Restrictions RLE Weight Bearing: Weight bearing as tolerated   Pertinent Vitals/Pain 6/10 hip pain tolerable to pt.    Mobility  Bed Mobility Bed Mobility: Supine to Sit (continues to need HOB elevated and uses rail heavily) Supine to Sit: 4: Min assist;HOB elevated;With rails Transfers Transfers: Sit to Stand;Stand to Sit Sit to Stand: 4: Min assist;With upper extremity assist;From chair/3-in-1 (struggles from lower surfaces) Stand to Sit: 5: Supervision;With upper extremity assist;With armrests;To chair/3-in-1 Ambulation/Gait Ambulation/Gait  Assistance: 5: Supervision Ambulation Distance (Feet): 100 Feet Assistive device: Rolling walker Ambulation/Gait Assistance Details: observation for safety with longer distance traveled this time, assure consistency with stepping sequence Gait Pattern: Step-to pattern;Antalgic Gait velocity: decr Stairs: Yes Stairs Assistance: 5: Supervision Stairs Assistance Details (indicate cue type and reason): cues and observation for safest technique to ascend one threshold/platform step simulating home, pt needs instruction for descend sequence Stair Management Technique: No rails;Forwards;With walker Number of Stairs: 1 Wheelchair Mobility Wheelchair Mobility: No    Exercises     PT Diagnosis:    PT Problem List:   PT Treatment Interventions:     PT Goals (current goals can now be found in the care plan section)    Visit Information  Last PT Received On: 05/05/13 Assistance Needed: +1 PT/OT Co-Evaluation/Treatment: Yes History of Present Illness: s/p right THA    Subjective Data  Subjective: concerned about having no assistance during day at home "I'm afraid I'd stay in bed all day"   Cognition  Cognition Arousal/Alertness: Awake/alert Behavior During Therapy: WFL for tasks assessed/performed Overall Cognitive Status: Within Functional Limits for tasks assessed    Balance     End of Session PT - End of Session Activity Tolerance: Patient tolerated treatment well Patient left: in chair;with call bell/phone within reach Nurse Communication: Mobility status   GP     Dennis Bast 05/05/2013, 3:45 PM

## 2013-05-05 NOTE — Discharge Summary (Deleted)
Patient ID: Cynthia Velasquez MRN: [id number] DOB/AGE: 1951/06/30 62 y.o.  Admit date: 05/03/2013 Discharge date: 05/05/2013  Admission Diagnoses:  Principal Problem:   Osteoarthritis of right hip   Discharge Diagnoses:  Same  Past Medical History  Diagnosis Date  . Cancer   . Breast cancer   . Lumbar stenosis   . Hyperlipidemia     takes Simvastatin daily  . Hypertension     takes Hyzaar  . History of migraine   . Arthritis   . Joint pain   . Joint swelling   . GERD (gastroesophageal reflux disease)     doesn't take any meds for this  . History of bladder infections   . Gallstones     Surgeries: Procedure(s): TOTAL HIP ARTHROPLASTY on 05/03/2013   Consultants:    Discharged Condition: Improved  Hospital Course: Cynthia Velasquez is an 62 y.o. female who was admitted 05/03/2013 for operative treatment ofOsteoarthritis of right hip. Patient has severe unremitting pain that affects sleep, daily activities, and work/hobbies. After pre-op clearance the patient was taken to the operating room on 05/03/2013 and underwent  Procedure(s): TOTAL HIP ARTHROPLASTY.    Patient was given perioperative antibiotics: Anti-infectives   Start     Dose/Rate Route Frequency Ordered Stop   05/02/13 1126  ceFAZolin (ANCEF) IVPB 2 g/50 mL premix  Status:  Discontinued     2 g 100 mL/hr over 30 Minutes Intravenous On call to O.R. 05/02/13 1126 05/03/13 1511       Patient was given sequential compression devices, early ambulation, and chemoprophylaxis to prevent DVT.  Patient benefited maximally from hospital stay and there were no complications.    Recent vital signs: Patient Vitals for the past 24 hrs:  BP Temp Pulse Resp SpO2 Height Weight  05/05/13 0551 119/65 mmHg 99.7 F (37.6 C) 100 18 95 % - -  05/04/13 2144 106/57 mmHg 99.5 F (37.5 C) 100 20 96 % - -  05/04/13 1547 - - - 20 99 % 5' 5.5" (1.664 m) 100.608 kg (221 lb 12.8 oz)  05/04/13 1411 123/76 mmHg 99.5 F (37.5 C) 97 20 98 % - -      Recent laboratory studies:  Recent Labs  05/04/13 0430 05/05/13 0600  WBC 9.9 14.5*  HGB 10.1* 9.8*  HCT 31.2* 29.8*  PLT 249 222  NA 138  --   K 3.5  --   CL 101  --   CO2 28  --   BUN 8  --   CREATININE 0.83  --   GLUCOSE 126*  --   CALCIUM 8.5  --      Discharge Medications:     Medication List         aspirin EC 325 MG tablet  Take 1 tablet (325 mg total) by mouth 2 (two) times daily.     losartan-hydrochlorothiazide 50-12.5 MG per tablet  Commonly known as:  HYZAAR  Take 1-1.5 tablets by mouth 2 (two) times daily. Take one tablet every morning and take half of a tablet every evening     methocarbamol 500 MG tablet  Commonly known as:  ROBAXIN  Take 1 tablet (500 mg total) by mouth every 6 (six) hours as needed.     methocarbamol 500 MG tablet  Commonly known as:  ROBAXIN  Take 1 tablet (500 mg total) by mouth 2 (two) times daily with a meal.     oxyCODONE-acetaminophen 5-325 MG per tablet  Commonly known as:  ROXICET  Take 1-2  tablets by mouth every 4 (four) hours as needed for pain.     oxyCODONE-acetaminophen 5-325 MG per tablet  Commonly known as:  ROXICET  Take 1 tablet by mouth every 4 (four) hours as needed for pain.     promethazine 25 MG tablet  Commonly known as:  PHENERGAN  Take 12.5-25 mg by mouth every 6 (six) hours as needed for nausea.     simvastatin 20 MG tablet  Commonly known as:  ZOCOR  Take 20 mg by mouth daily.     SUMAtriptan 50 MG tablet  Commonly known as:  IMITREX  Take 50 mg by mouth every 2 (two) hours as needed for migraine.        Diagnostic Studies: Dg Pelvis Portable  05/03/2013   *RADIOLOGY REPORT*  Clinical Data: Postop right total hip.  PORTABLE PELVIS  Comparison: 02/12/2013.  Findings: Right total hip prosthesis appears in satisfactory position without complication noted on this single projection.  Remote left hip prosthesis.  IMPRESSION: Right total hip prosthesis appears in satisfactory position without  complication noted on this single projection.   Original Report Authenticated By: Lacy Duverney, M.D.    Disposition: 01-Home or Self Care      Discharge Orders   Future Orders Complete By Expires   Call MD / Call 911  As directed    Comments:     If you experience chest pain or shortness of breath, CALL 911 and be transported to the hospital emergency room.  If you develope a fever above 101 F, pus (white drainage) or increased drainage or redness at the wound, or calf pain, call your surgeon's office.   Change dressing  As directed    Comments:     You may change your dressing on day 5, then change the dressing daily with sterile 4 x 4 inch gauze dressing and paper tape.  You may clean the incision with alcohol prior to redressing   Constipation Prevention  As directed    Comments:     Drink plenty of fluids.  Prune juice may be helpful.  You may use a stool softener, such as Colace (over the counter) 100 mg twice a day.  Use MiraLax (over the counter) for constipation as needed.   Diet - low sodium heart healthy  As directed    Discharge instructions  As directed    Comments:     Follow up with Dr. Turner Daniels in office in 2 weeks.   Driving restrictions  As directed    Comments:     No driving for 2 weeks   Follow the hip precautions as taught in Physical Therapy  As directed    Increase activity slowly as tolerated  As directed    Patient may shower  As directed    Comments:     You may shower without a dressing once there is no drainage.  Do not wash over the wound.  If drainage remains, cover wound with plastic wrap and then shower.      Follow-up Information   Follow up with Nestor Lewandowsky, MD In 2 weeks.   Specialty:  Orthopedic Surgery   Contact information:   [address] [place] [address] [phone]        Signed: Henry Russel 05/05/2013, 7:42 AM

## 2013-05-05 NOTE — Clinical Social Work Placement (Addendum)
Clinical Social Work Department  CLINICAL SOCIAL WORK PLACEMENT NOTE  05/05/2013  Patient: Eltha Tingley Account Number: [id number] Admit date: 05/03/13  Clinical Social Worker: Sabino Niemann MSW Date/time: 05/04/2013 4:30 PM  Clinical Social Work is seeking post-discharge placement for this patient at the following level of care: SKILLED NURSING (*CSW will update this form in Epic as items are completed)  05/05/2013 Patient/family provided with [facility] Department of Clinical Social Work's list of facilities offering this level of care within the geographic area requested by the patient (or if unable, by the patient's family).  05/05/2013 Patient/family informed of their freedom to choose among providers that offer the needed level of care, that participate in Medicare, Medicaid or managed care program needed by the patient, have an available bed and are willing to accept the patient.  05/05/2013 Patient/family informed of [facility]' ownership interest in [facility], as well as of the fact that they are under no obligation to receive care at this facility.  PASARR submitted to EDS on 05/06/2013 PASARR number received from EDS on 05/06/2013 FL2 transmitted to all facilities in geographic area requested by pt/family on 05/05/2013  FL2 transmitted to all facilities within larger geographic area on  Patient informed that his/her managed care company has contracts with or will negotiate with certain facilities, including the following:  Patient/family informed of bed offers received:  Patient chooses bed at  Physician recommends and patient chooses bed at  Patient to be transferred to on  Patient to be transferred to facility by  The following physician request were entered in Epic:  Additional Comments:

## 2013-05-05 NOTE — Progress Notes (Signed)
SW received a consult for possible placement. PT  At this time is recommending home with HH and not SNF.  Clinical Social Worker will sign off for now as social work intervention is no longer needed. Please consult us again if new need arises.   Kary Sugrue, MSW [phone] 

## 2013-05-05 NOTE — Clinical Social Work Psychosocial (Signed)
Clinical Social Work Department  BRIEF PSYCHOSOCIAL ASSESSMENT  [name]  Account Number: [id number]   Admit date: 05/03/13 Clinical Social Worker Sabino Niemann, MSW Date/Time: 05/05/2013 3:45 Referred by: Physician Date Referred: 05/05/2013 Referred for   SNF Placement   Other Referral:  Interview type: Patient  Other interview type: PSYCHOSOCIAL DATA  Living Status: Alone Admitted from facility:  Level of care:  Primary support name: [name] Isaacs Primary support relationship to patient: Daughter Degree of support available:  Strong and vested  CURRENT CONCERNS  Current Concerns   Post-Acute Placement   Other Concerns:  SOCIAL WORK ASSESSMENT / PLAN  CSW met with pt re: PT recommendation for SNF.   Pt lives alone  CSW explained placement process and answered questions.   Pt reports no preference at this time   CSW completed FL2 and initiated SNF search.     Assessment/plan status: Information/Referral to [facility]  Other assessment/ plan:  Information/referral to community resources:  SNF     PATIENT'S/FAMILY'S RESPONSE TO PLAN OF CARE:  Pt  reports she is agreeable to ST SNF in order to increase strength and independence with mobility prior to returning home  Pt verbalized understanding of placement process and appreciation for CSW assist.   Sabino Niemann, MSW [phone]

## 2013-05-05 NOTE — Progress Notes (Signed)
Physical Therapy Treatment Patient Details Name: Cynthia Velasquez MRN: [id number] DOB: [birth year] Today's Date: 05/05/2013 Time: 9629-5284 PT Time Calculation (min): 24 min  PT Assessment / Plan / Recommendation  History of Present Illness s/p right THA   PT Comments   Pt presents today with less hip pain but still limited by constipation and associated symptoms.  Pt understands strategies to manage constipation including fluids, mobility and instructed in abdominal massage if these strategies don't work.  Hopeful for bm soon.  Pt is slowly progressing with mobility and  I anticipate quicker progress once constipation is resolved; from PT standpoint I think she will be able to go home once medically ready, however, pt may not be able to provide adequate self-care and would coordinate with OT for optimal plan which may include SNF.  Will continually reassess and update with each visit. Recommend CSW initiate bed search in order to minimize d/c delay if this is the final plan.   Follow Up Recommendations  Home health PT;Supervision/Assistance - 24 hour     Does the patient have the potential to tolerate intense rehabilitation     Barriers to Discharge        Equipment Recommendations  None recommended by PT    Recommendations for Other Services    Frequency 7X/week   Progress towards PT Goals Progress towards PT goals: Progressing toward goals  Plan Current plan remains appropriate    Precautions / Restrictions Precautions Precautions: Posterior Hip Precaution Comments: pt recalls hip precautions with minimal cues for turning during ambulation to avoid pivoting on operated leg Restrictions RLE Weight Bearing: Weight bearing as tolerated   Pertinent Vitals/Pain 7/10 in hip, pt declines pain meds due to constipation    Mobility  Bed Mobility Bed Mobility: Not assessed (sitting EOB on arrival) Sitting - Scoot to Edge of Bed: 6: Modified independent (Device/Increase  time) Transfers Transfers: Sit to Stand;Stand to Sit Sit to Stand: 5: Supervision;From elevated surface;With upper extremity assist;From bed (HOB raised and uses rail; elevates bed Indepen) Stand to Sit: 5: Supervision;With upper extremity assist;With armrests;To chair/3-in-1 (BSC over toilet in bathroom) Details for Transfer Assistance: standby assist for safety and to observe technique for maximal pain management; pt raises bed height without pain independently and needs incr time to transition up to RW and down to sitting surface; independently positions operated leg for max safety without cues Ambulation/Gait Ambulation/Gait Assistance: 5: Supervision Ambulation Distance (Feet): 15 Feet Assistive device: Rolling walker Ambulation/Gait Assistance Details: standby for short distance to bathroom; pt self-cues verbally for gait sequencing with RW and is limited in distance due to contipation and associated nausea/discomfort.   Gait Pattern: Step-to pattern;Antalgic Gait velocity: decr Stairs: No Wheelchair Mobility Wheelchair Mobility: No    Exercises     PT Diagnosis:    PT Problem List:   PT Treatment Interventions:     PT Goals (current goals can now be found in the care plan section) Acute Rehab PT Goals Patient Stated Goal: to go home  Visit Information  Last PT Received On: 05/05/13 Assistance Needed: +1 History of Present Illness: s/p right THA    Subjective Data  Subjective: feeling better but constipated/nauseated and stomach hurts Patient Stated Goal: to go home   Cognition  Cognition Arousal/Alertness: Awake/alert Behavior During Therapy: WFL for tasks assessed/performed Overall Cognitive Status: Within Functional Limits for tasks assessed    Balance  Balance Balance Assessed: No  End of Session PT - End of Session Activity Tolerance: Patient limited by pain (  again, constipation, nausea, etc in stomach) Patient left: in chair;with call bell/phone within reach  (in bathroom, CNA notified, call bell cord in reach) Nurse Communication: Mobility status   GP     Dennis Bast 05/05/2013, 10:49 AM

## 2013-05-05 NOTE — Progress Notes (Addendum)
Occupational Therapy Treatment Patient Details Name: Cynthia Velasquez MRN: [id number] DOB: July 18, 1951 Today's Date: 05/05/2013 Time: 7829-5621 OT Time Calculation (min): 31 min  OT Assessment / Plan / Recommendation  History of present illness s/p right THA   OT comments  Pt performed toileting tasks and washed hands at sink. Pt ambulated in hallway today. Pt still requiring assistance with bed mobility and relying on HOB elevated and rails. OT provided pt with theraband for UE exercises and pt performed shoulder flexion/extension exercise. Recommending SNF at this time for d/c.   Follow Up Recommendations  SNF;Supervision/Assistance - 24 hour;Home health OT    Barriers to Discharge       Equipment Recommendations  None recommended by OT    Recommendations for Other Services    Frequency Min 2X/week   Progress towards OT Goals Progress towards OT goals: Progressing toward goals  Plan Discharge plan remains appropriate    Precautions / Restrictions Precautions Precautions: Posterior Hip Precaution Comments: pt able to state 3/3 hip precautions. Cues to maintain them during session. Restrictions Weight Bearing Restrictions: Yes RLE Weight Bearing: Weight bearing as tolerated   Pertinent Vitals/Pain Pain 7/10 in hip. Repositioned.     ADL  Grooming: Wash/dry hands;Supervision/safety Where Assessed - Grooming: Unsupported standing Toilet Transfer: Performed;Min guard Statistician Method: Sit to Barista: Raised toilet seat with arms (or 3-in-1 over toilet) Toileting - Clothing Manipulation and Hygiene: Min guard;Supervision/safety Where Assessed - Toileting Clothing Manipulation and Hygiene: Sit to stand from 3-in-1 or toilet;Sit on 3-in-1 or toilet (hygiene-sitting and clothing-sit to stand) Tub/Shower Transfer: Min guard;Supervision/safety (min guard-clothing and hygiene-supervision) Equipment Used: Other (comment);Rolling walker;Gait belt  (theraband) Transfers/Ambulation Related to ADLs: Min guard for ambulation. Min A/Min guard for transfers. ADL Comments: Pt ambulated to bathroom to perform toileting tasks. Pt washed hands at sink. Pt ambulated in hallway and is furthest distance she has ambulated. Pt performed UE shoulder flexion/extension exercises with theraband to increase UB strength as pt c/o fatigue and weakness in shoulders.  Also, educated on chair push ups as well as elbow extension exercise with theraband. Cues to maintain precautions during session.    OT Diagnosis:    OT Problem List:   OT Treatment Interventions:     OT Goals(current goals can now be found in the care plan section) Acute Rehab OT Goals Patient Stated Goal: to go home OT Goal Formulation: With patient Time For Goal Achievement: 05/11/13 Potential to Achieve Goals: Good ADL Goals Pt Will Transfer to Toilet: with modified independence;ambulating (3 in 1 over commode) Pt Will Perform Toileting - Clothing Manipulation and hygiene: with modified independence;sit to/from stand Additional ADL Goal #1: Pt will be independent in HEP of BUE's to increase strength.  Visit Information  Last OT Received On: 05/05/13 Assistance Needed: +1 History of Present Illness: s/p right THA    Subjective Data      Prior Functioning       Cognition  Cognition Arousal/Alertness: Awake/alert Behavior During Therapy: WFL for tasks assessed/performed Overall Cognitive Status: Within Functional Limits for tasks assessed    Mobility  Bed Mobility Bed Mobility: Supine to Sit;Sitting - Scoot to Edge of Bed Supine to Sit: 4: Min assist;HOB elevated;With rails Sitting - Scoot to Edge of Bed: 4: Min guard Details for Bed Mobility Assistance: Assist for RLE. Pt using rails and HOB elevated.  Transfers Transfers: Sit to Stand;Stand to Sit Sit to Stand: 4: Min assist;With upper extremity assist;From bed;4: Min guard;From chair/3-in-1 Stand to Sit: 4:  Min  guard;With upper extremity assist;To chair/3-in-1 Details for Transfer Assistance: Min A for sit to stand from bed. Min guard from 3 in 1. Min guard for stand to sit transfers.    Exercises  General Exercises - Upper Extremity Shoulder Flexion: AROM;Strengthening;Both;10 reps;Seated;Theraband Theraband Level (Shoulder Flexion): Level 3 (Green) Shoulder Extension: AROM;Strengthening;Both;10 reps;Seated   Balance     End of Session OT - End of Session Equipment Utilized During Treatment: Gait belt;Rolling walker Activity Tolerance: Patient tolerated treatment well Patient left: in chair;with call bell/phone within reach  [employer] OTR/L [phone] 05/05/2013, 4:07 PM

## 2013-05-05 NOTE — Progress Notes (Signed)
PATIENT ID: Cynthia Velasquez  MRN: [id number]  DOB/AGE:  Dec 25, 1950 / 62 y.o.  2 Days Post-Op Procedure(s) (LRB): TOTAL HIP ARTHROPLASTY (Right)    PROGRESS NOTE Subjective: Patient is alert, oriented,no Nausea, no Vomiting, not passing gas, no Bowel Movement. Taking PO well, pt up eating. Denies SOB, Chest or Calf Pain. Using Incentive Spirometer, PAS in place. Ambulate WBAT Patient reports pain as moderate  .    Objective: Vital signs in last 24 hours: Filed Vitals:   05/04/13 1411 05/04/13 1547 05/04/13 2144 05/05/13 0551  BP: 123/76  106/57 119/65  Pulse: 97  100 100  Temp: 99.5 F (37.5 C)  99.5 F (37.5 C) 99.7 F (37.6 C)  TempSrc:      Resp: 20 20 20 18   Height:  5' 5.5" (1.664 m)    Weight:  100.608 kg (221 lb 12.8 oz)    SpO2: 98% 99% 96% 95%      Intake/Output from previous day: I/O last 3 completed shifts: In: -  Out: 900 [Urine:900]   Intake/Output this shift:     LABORATORY DATA:  Recent Labs  05/04/13 0430 05/05/13 0600  WBC 9.9 14.5*  HGB 10.1* 9.8*  HCT 31.2* 29.8*  PLT 249 222  NA 138  --   K 3.5  --   CL 101  --   CO2 28  --   BUN 8  --   CREATININE 0.83  --   GLUCOSE 126*  --   CALCIUM 8.5  --     Examination: Neurologically intact ABD soft Neurovascular intact Sensation intact distally Intact pulses distally Dorsiflexion/Plantar flexion intact Incision: no drainage No cellulitis present Compartment soft} XR AP&Lat of hip shows well placed\fixed THA Pt does have mild abdominal bloating, but a soft abdomen  Assessment:   2 Days Post-Op Procedure(s) (LRB): TOTAL HIP ARTHROPLASTY (Right) ADDITIONAL DIAGNOSIS:  Hypertension  Plan: PT/OT WBAT, THA  posterior precautions  DVT Prophylaxis: SCDx72 hrs, ASA 325 mg BID x 2 weeks  DISCHARGE PLAN: Home  Pending clearance from PT.    DISCHARGE NEEDS: HHPT, HHRN, Walker and 3-in-1 comode seat  Pt encouraged to get up with PT, as this will help with getting her bowels moving.  Pt  encouraged to ask Nursing for medications to help with constipation.

## 2013-05-06 LAB — CBC
HCT: 30.7 % — ABNORMAL LOW (ref 36.0–46.0)
Hemoglobin: 9.9 g/dL — ABNORMAL LOW (ref 12.0–15.0)
MCH: 25.3 pg — ABNORMAL LOW (ref 26.0–34.0)
MCHC: 32.2 g/dL (ref 30.0–36.0)
MCV: 78.5 fL (ref 78.0–100.0)
RBC: 3.91 MIL/uL (ref 3.87–5.11)

## 2013-05-06 MED ORDER — MAGNESIUM CITRATE PO SOLN
1.0000 | Freq: Every day | ORAL | Status: DC | PRN
  Administered 2013-05-06: 1 via ORAL
  Filled 2013-05-06: qty 296

## 2013-05-06 NOTE — Progress Notes (Signed)
Physical Therapy Treatment Patient Details Name: Cynthia Velasquez MRN: [id number] DOB: [birth year] Today's Date: 05/06/2013 Time: 9562-1308 PT Time Calculation (min): 27 min  PT Assessment / Plan / Recommendation  History of Present Illness s/p right THA   PT Comments   Patient progressing with ambulation quality this session. Stated that she feels that if she could move her bowels she would be better at ambulating. Continue to encourage mobility. Patient awaiting SNF at this time.   Follow Up Recommendations  SNF     Does the patient have the potential to tolerate intense rehabilitation     Barriers to Discharge        Equipment Recommendations  None recommended by PT    Recommendations for Other Services    Frequency 7X/week   Progress towards PT Goals Progress towards PT goals: Progressing toward goals  Plan Discharge plan needs to be updated    Precautions / Restrictions Precautions Precautions: Posterior Hip Precaution Comments: pt able to state 3/3 hip precautions. Cues to maintain them during session. Restrictions RLE Weight Bearing: Weight bearing as tolerated   Pertinent Vitals/Pain 8/10 hip pain. RN aware    Mobility  Bed Mobility Supine to Sit: 4: Min assist;HOB elevated;With rails Details for Bed Mobility Assistance: Assist for RLE. Pt using rails and HOB elevated.  Transfers Sit to Stand: With upper extremity assist;From bed;From chair/3-in-1;4: Min assist Stand to Sit: 4: Min guard;With upper extremity assist;To chair/3-in-1 Details for Transfer Assistance: Min A for sit to stand from bed. Cues for correct hand placement and to control descent to recliner Ambulation/Gait Ambulation/Gait Assistance: 5: Supervision Ambulation Distance (Feet): 100 Feet Assistive device: Rolling walker Ambulation/Gait Assistance Details: Cues to increase weight through LEs as UEs became fatigue Gait Pattern: Step-to pattern Gait velocity: decr    Exercises Total Joint  Exercises Quad Sets: AROM;Both;10 reps;Seated Long Arc Quad: AROM;10 reps;Right   PT Diagnosis:    PT Problem List:   PT Treatment Interventions:     PT Goals (current goals can now be found in the care plan section)    Visit Information  Last PT Received On: 05/06/13 Assistance Needed: +1 History of Present Illness: s/p right THA    Subjective Data      Cognition  Cognition Arousal/Alertness: Awake/alert Behavior During Therapy: WFL for tasks assessed/performed Overall Cognitive Status: Within Functional Limits for tasks assessed    Balance     End of Session PT - End of Session Equipment Utilized During Treatment: Gait belt Activity Tolerance: Patient tolerated treatment well Patient left: in chair;with call bell/phone within reach   GP     Robinette, Adline Potter 05/06/2013, 2:16 PM 05/06/2013 Fredrich Birks PTA [phone] pager [phone] office

## 2013-05-06 NOTE — Progress Notes (Signed)
Patient ID: Cynthia Velasquez, female   DOB: [birth year], 62 y.o.   MRN: [id number] PATIENT ID: Cynthia Velasquez  MRN: [id number]  DOB/AGE:  1951/05/15 / 62 y.o.  3 Days Post-Op Procedure(s) (LRB): TOTAL HIP ARTHROPLASTY (Right)    PROGRESS NOTE Subjective: Patient is alert, oriented,no Nausea, no Vomiting, yes passing gas, no Bowel Movement. Taking PO well. Denies SOB, Chest or Calf Pain. Using Incentive Spirometer, PAS in place. Ambulate WBAT Patient reports pain as 3 on 0-10 scale  .    Objective: Vital signs in last 24 hours: Filed Vitals:   05/05/13 2000 05/06/13 0000 05/06/13 0400 05/06/13 0533  BP: 131/61   99/61  Pulse: 100   92  Temp: 98.1 F (36.7 C)   98.8 F (37.1 C)  TempSrc:    Oral  Resp: 17 16 17 18   Height:      Weight:      SpO2: 100%   99%      Intake/Output from previous day: I/O last 3 completed shifts: In: 570 [P.O.:570] Out: -    Intake/Output this shift:     LABORATORY DATA:  Recent Labs  05/04/13 0430 05/05/13 0600 05/06/13 0500  WBC 9.9 14.5* 14.0*  HGB 10.1* 9.8* 9.9*  HCT 31.2* 29.8* 30.7*  PLT 249 222 264  NA 138  --   --   K 3.5  --   --   CL 101  --   --   CO2 28  --   --   BUN 8  --   --   CREATININE 0.83  --   --   GLUCOSE 126*  --   --   CALCIUM 8.5  --   --     Examination: Neurologically intact ABD soft Neurovascular intact Sensation intact distally Intact pulses distally Dorsiflexion/Plantar flexion intact Incision: no drainage No cellulitis present Compartment soft} XR AP&Lat of hip shows well placed\fixed THA  Assessment:   3 Days Post-Op Procedure(s) (LRB): TOTAL HIP ARTHROPLASTY (Right) ADDITIONAL DIAGNOSIS:  Hypertension  Plan: PT/OT WBAT, THA  posterior precautions  DVT Prophylaxis: SCDx72 hrs, ASA 325 mg BID x 2 weeks  DISCHARGE PLAN: Skilled Nursing Facility/Rehab  DISCHARGE NEEDS: HHPT, HHRN, CPM, Walker and 3-in-1 comode seat

## 2013-05-07 MED ORDER — HYDROMORPHONE HCL 2 MG PO TABS: 2.0000 mg | ORAL_TABLET | ORAL | Status: DC | PRN

## 2013-05-07 NOTE — Progress Notes (Signed)
PATIENT ID: Cynthia Velasquez  MRN: [id number]  DOB/AGE:  [birth year] / 62 y.o.  4 Days Post-Op Procedure(s) (LRB): TOTAL HIP ARTHROPLASTY (Right)    PROGRESS NOTE Subjective: Patient is alert, oriented,no Nausea, no Vomiting, yes passing gas, yes Bowel Movement. Taking PO well. Denies SOB, Chest or Calf Pain. Using Incentive Spirometer, PAS in place. Ambulate wbat, pt up in room using walker Patient reports pain as mild  .    Objective: Vital signs in last 24 hours: Filed Vitals:   05/06/13 0900 05/06/13 1300 05/07/13 0655 05/07/13 1030  BP: 107/83 100/59 112/64 100/70  Pulse: 92 88 82 91  Temp:  98.6 F (37 C) 98.7 F (37.1 C)   TempSrc:   Oral   Resp:  17 16   Height:      Weight:      SpO2:  98% 95%       Intake/Output from previous day: I/O last 3 completed shifts: In: 5426 [P.O.:655; I.V.:4771] Out: -    Intake/Output this shift: Total I/O In: 150 [P.O.:150] Out: -    LABORATORY DATA:  Recent Labs  05/05/13 0600 05/06/13 0500  WBC 14.5* 14.0*  HGB 9.8* 9.9*  HCT 29.8* 30.7*  PLT 222 264    Examination: Neurologically intact ABD soft Neurovascular intact Sensation intact distally Intact pulses distally Dorsiflexion/Plantar flexion intact Incision: no drainage No cellulitis present Compartment soft} XR AP&Lat of hip shows well placed\fixed THA  Assessment:   4 Days Post-Op Procedure(s) (LRB): TOTAL HIP ARTHROPLASTY (Right) ADDITIONAL DIAGNOSIS:  Hypertension  Plan: PT/OT WBAT, THA  posterior precautions  DVT Prophylaxis: SCDx72 hrs, ASA 325 mg BID x 2 weeks  DISCHARGE PLAN: Skilled Nursing Facility/Rehab if pt can find placement.  If not, the pt will D/C home.  DISCHARGE NEEDS: HHPT, HHRN, Walker and 3-in-1 comode seat

## 2013-05-07 NOTE — Progress Notes (Signed)
Patient has decided to go home with home health. Clinical Social Worker will sign off for now as social work intervention is no longer needed. Please consult [name] again if new need arises.   Sabino Niemann, MSW,  [phone]

## 2013-05-07 NOTE — Progress Notes (Signed)
05/07/13 Patient decided to go home with [facility] rather than SNF. Jacquelynn Cree RN, [facility]  05/04/13 HHPT, OT and RN ordered. Spoke with patient about HHC, she chose [facility] which she worked with in past. Nutritional therapist with [facility] and set up HHPT, HHOT and RN. [employer] will provide patient with a rolling walker if she does not already have one. No other equipment needs identified. Jacquelynn Cree Rn, BSN, CCM

## 2013-05-07 NOTE — Discharge Summary (Signed)
Patient ID: Cynthia Velasquez MRN: [id number] DOB/AGE: April 06, 1951 62 y.o.  Admit date: 05/03/2013 Discharge date: 05/07/2013  Admission Diagnoses:  Principal Problem:   Osteoarthritis of right hip   Discharge Diagnoses:  Same  Past Medical History  Diagnosis Date  . Cancer   . Breast cancer   . Lumbar stenosis   . Hyperlipidemia     takes Simvastatin daily  . Hypertension     takes Hyzaar  . History of migraine   . Arthritis   . Joint pain   . Joint swelling   . GERD (gastroesophageal reflux disease)     doesn't take any meds for this  . History of bladder infections   . Gallstones     Surgeries: Procedure(s): TOTAL HIP ARTHROPLASTY on 05/03/2013   Consultants:    Discharged Condition: Improved  Hospital Course: Ellenore Roscoe is an 62 y.o. female who was admitted 05/03/2013 for operative treatment ofOsteoarthritis of right hip. Patient has severe unremitting pain that affects sleep, daily activities, and work/hobbies. After pre-op clearance the patient was taken to the operating room on 05/03/2013 and underwent  Procedure(s): TOTAL HIP ARTHROPLASTY.    Patient was given perioperative antibiotics: Anti-infectives   Start     Dose/Rate Route Frequency Ordered Stop   05/02/13 1126  ceFAZolin (ANCEF) IVPB 2 g/50 mL premix  Status:  Discontinued     2 g 100 mL/hr over 30 Minutes Intravenous On call to O.R. 05/02/13 1126 05/03/13 1511       Patient was given sequential compression devices, early ambulation, and chemoprophylaxis to prevent DVT.  Patient benefited maximally from hospital stay and there were no complications.    Recent vital signs: Patient Vitals for the past 24 hrs:  BP Temp Temp src Pulse Resp SpO2  05/07/13 1030 100/70 mmHg - - 91 - -  05/07/13 0655 112/64 mmHg 98.7 F (37.1 C) Oral 82 16 95 %  05/06/13 1300 100/59 mmHg 98.6 F (37 C) - 88 17 98 %     Recent laboratory studies:  Recent Labs  05/05/13 0600 05/06/13 0500  WBC 14.5* 14.0*  HGB  9.8* 9.9*  HCT 29.8* 30.7*  PLT 222 264     Discharge Medications:     Medication List         aspirin EC 325 MG tablet  Take 1 tablet (325 mg total) by mouth 2 (two) times daily.     HYDROmorphone 2 MG tablet  Commonly known as:  DILAUDID  Take 1 tablet (2 mg total) by mouth every 4 (four) hours as needed for pain.     losartan-hydrochlorothiazide 50-12.5 MG per tablet  Commonly known as:  HYZAAR  Take 1-1.5 tablets by mouth 2 (two) times daily. Take one tablet every morning and take half of a tablet every evening     methocarbamol 500 MG tablet  Commonly known as:  ROBAXIN  Take 1 tablet (500 mg total) by mouth every 6 (six) hours as needed.     methocarbamol 500 MG tablet  Commonly known as:  ROBAXIN  Take 1 tablet (500 mg total) by mouth 2 (two) times daily with a meal.     oxyCODONE-acetaminophen 5-325 MG per tablet  Commonly known as:  ROXICET  Take 1-2 tablets by mouth every 4 (four) hours as needed for pain.     oxyCODONE-acetaminophen 5-325 MG per tablet  Commonly known as:  ROXICET  Take 1 tablet by mouth every 4 (four) hours as needed for pain.  promethazine 25 MG tablet  Commonly known as:  PHENERGAN  Take 12.5-25 mg by mouth every 6 (six) hours as needed for nausea.     simvastatin 20 MG tablet  Commonly known as:  ZOCOR  Take 20 mg by mouth daily.     SUMAtriptan 50 MG tablet  Commonly known as:  IMITREX  Take 50 mg by mouth every 2 (two) hours as needed for migraine.        Diagnostic Studies: Dg Pelvis Portable  05/03/2013   *RADIOLOGY REPORT*  Clinical Data: Postop right total hip.  PORTABLE PELVIS  Comparison: 02/12/2013.  Findings: Right total hip prosthesis appears in satisfactory position without complication noted on this single projection.  Remote left hip prosthesis.  IMPRESSION: Right total hip prosthesis appears in satisfactory position without complication noted on this single projection.   Original Report Authenticated By: Lacy Duverney, M.D.    Disposition: 01-Home or Self Care      Discharge Orders   Future Orders Complete By Expires   Call MD / Call 911  As directed    Comments:     If you experience chest pain or shortness of breath, CALL 911 and be transported to the hospital emergency room.  If you develope a fever above 101 F, pus (white drainage) or increased drainage or redness at the wound, or calf pain, call your surgeon's office.   Change dressing  As directed    Comments:     You may change your dressing on day 5, then change the dressing daily with sterile 4 x 4 inch gauze dressing and paper tape.  You may clean the incision with alcohol prior to redressing   Constipation Prevention  As directed    Comments:     Drink plenty of fluids.  Prune juice may be helpful.  You may use a stool softener, such as Colace (over the counter) 100 mg twice a day.  Use MiraLax (over the counter) for constipation as needed.   Diet - low sodium heart healthy  As directed    Discharge instructions  As directed    Comments:     Follow up with Dr. Turner Daniels in office in 2 weeks.   Driving restrictions  As directed    Comments:     No driving for 2 weeks   Follow the hip precautions as taught in Physical Therapy  As directed    Increase activity slowly as tolerated  As directed    Patient may shower  As directed    Comments:     You may shower without a dressing once there is no drainage.  Do not wash over the wound.  If drainage remains, cover wound with plastic wrap and then shower.      Follow-up Information   Follow up with Nestor Lewandowsky, MD In 2 weeks.   Specialty:  Orthopedic Surgery   Contact information:   [address] [place] [address] [phone]        Signed: Henry Russel 05/07/2013, 12:47 PM

## 2013-05-07 NOTE — Progress Notes (Signed)
Physical Therapy Treatment Patient Details Name: Cynthia Velasquez MRN: [id number] DOB: 1950-11-15 Today's Date: 05/07/2013 Time: 1478-2956 PT Time Calculation (min): 26 min  PT Assessment / Plan / Recommendation  History of Present Illness s/p right THA   PT Comments   Patient progressing with ambulation and overall mobility. Awaiting insurance approval for SNF  Follow Up Recommendations  SNF     Does the patient have the potential to tolerate intense rehabilitation     Barriers to Discharge        Equipment Recommendations  None recommended by PT    Recommendations for Other Services    Frequency 7X/week   Progress towards PT Goals Progress towards PT goals: Progressing toward goals  Plan Discharge plan needs to be updated    Precautions / Restrictions Precautions Precautions: Posterior Hip Precaution Comments: pt able to state 3/3 hip precautions. Cues to maintain them during session. Restrictions RLE Weight Bearing: Weight bearing as tolerated   Pertinent Vitals/Pain no apparent distress     Mobility  Bed Mobility Bed Mobility: Not assessed Transfers Sit to Stand: 4: Min guard;From chair/3-in-1;With armrests;With upper extremity assist Stand to Sit: With upper extremity assist;With armrests;To chair/3-in-1 Details for Transfer Assistance: Cues for safe hand placement Ambulation/Gait Ambulation/Gait Assistance: 5: Supervision Ambulation Distance (Feet): 150 Feet Assistive device: Rolling walker Ambulation/Gait Assistance Details: Cues to increase weight through LEs Gait Pattern: Step-to pattern    Exercises Total Joint Exercises Short Arc Quad: AAROM;Right;10 reps Heel Slides: AAROM;Right;10 reps Hip ABduction/ADduction: AROM;Right;10 reps   PT Diagnosis:    PT Problem List:   PT Treatment Interventions:     PT Goals (current goals can now be found in the care plan section)    Visit Information  Last PT Received On: 05/07/13 Assistance Needed:  +1 History of Present Illness: s/p right THA    Subjective Data      Cognition  Cognition Arousal/Alertness: Awake/alert Behavior During Therapy: WFL for tasks assessed/performed Overall Cognitive Status: Within Functional Limits for tasks assessed    Balance     End of Session PT - End of Session Equipment Utilized During Treatment: Gait belt Activity Tolerance: Patient tolerated treatment well Patient left: in chair;with call bell/phone within reach Nurse Communication: Mobility status   GP     Fredrich Birks 05/07/2013, 2:29 PM 05/07/2013 Fredrich Birks PTA [phone] pager [phone] office

## 2013-05-07 NOTE — Progress Notes (Signed)
Pt discharged home with daughter. Education given to patient and family member. Iv removed. Pt verbalizes understanding of follow-up protocol.

## 2013-06-18 ENCOUNTER — Other Ambulatory Visit (HOSPITAL_COMMUNITY): Payer: Self-pay | Admitting: Orthopedic Surgery

## 2013-06-18 ENCOUNTER — Ambulatory Visit (HOSPITAL_COMMUNITY)
Admission: RE | Admit: 2013-06-18 | Discharge: 2013-06-18 | Disposition: A | Discharge status: Home / Self Care | Payer: BC Managed Care – PPO | Source: Ambulatory Visit | Attending: Orthopedic Surgery | Admitting: Orthopedic Surgery

## 2013-06-18 DIAGNOSIS — M79609 Pain in unspecified limb: Secondary | ICD-10-CM

## 2013-06-18 DIAGNOSIS — M7989 Other specified soft tissue disorders: Secondary | ICD-10-CM | POA: Insufficient documentation

## 2013-06-18 NOTE — Progress Notes (Signed)
*  PRELIMINARY RESULTS* Vascular Ultrasound Right lower extremity venous duplex has been completed.  Preliminary findings: negative for DVT.  Called report to [facility].  Farrel Demark, RDMS, RVT  06/18/2013, 3:07 PM

## 2013-07-30 ENCOUNTER — Telehealth: Payer: Self-pay | Admitting: *Deleted

## 2013-07-30 NOTE — Telephone Encounter (Signed)
Left message for pt to return my call so I can schedule a Med Onc appt. 

## 2013-08-02 ENCOUNTER — Other Ambulatory Visit: Payer: Self-pay | Admitting: Family Medicine

## 2013-08-02 ENCOUNTER — Telehealth: Payer: Self-pay | Admitting: *Deleted

## 2013-08-02 ENCOUNTER — Encounter: Payer: Self-pay | Admitting: *Deleted

## 2013-08-02 DIAGNOSIS — R0781 Pleurodynia: Secondary | ICD-10-CM

## 2013-08-02 NOTE — Progress Notes (Signed)
At the time that I scheduled this patient there were only 10 pts on Dr. Milta Deiters schedule.

## 2013-08-02 NOTE — Telephone Encounter (Signed)
Pt returned my call and has questions concerning her insurance.  I informed her that I do not handle insurance, but would get her to the appropriate person.  I emailed Raquel and Lenise to please call and discuss with the patient so she could make a decision to come here or not.

## 2013-08-02 NOTE — Telephone Encounter (Signed)
Got the ok from Lenise to call and schedule the pt.  Called and confirmed 11.22.14 appt w/ pt.  Mailed before appt letter, welcome packet & intake form to pt.  Took paperwork to Med Rec for chart.

## 2013-08-09 ENCOUNTER — Encounter: Payer: Self-pay | Admitting: *Deleted

## 2013-08-09 ENCOUNTER — Other Ambulatory Visit: Payer: Self-pay | Admitting: *Deleted

## 2013-08-09 DIAGNOSIS — C50411 Malignant neoplasm of upper-outer quadrant of right female breast: Secondary | ICD-10-CM | POA: Insufficient documentation

## 2013-08-09 DIAGNOSIS — C50412 Malignant neoplasm of upper-outer quadrant of left female breast: Secondary | ICD-10-CM

## 2013-08-09 NOTE — Progress Notes (Signed)
Received chart back from Dawn and placed in Dr. Khan's box. 

## 2013-08-09 NOTE — Progress Notes (Signed)
Completed chart and gave to Dawn to enter labs and give back to me so I can give to MD. 

## 2013-08-10 ENCOUNTER — Inpatient Hospital Stay: Admission: RE | Admit: 2013-08-10 | Payer: BC Managed Care – PPO | Source: Ambulatory Visit

## 2013-08-17 ENCOUNTER — Ambulatory Visit
Admission: RE | Admit: 2013-08-17 | Discharge: 2013-08-17 | Disposition: A | Discharge status: Home / Self Care | Payer: No Typology Code available for payment source | Source: Ambulatory Visit | Attending: Family Medicine | Admitting: Family Medicine

## 2013-08-17 DIAGNOSIS — R0781 Pleurodynia: Secondary | ICD-10-CM

## 2013-08-17 MED ORDER — IOHEXOL 300 MG/ML  SOLN
75.0000 mL | Freq: Once | INTRAMUSCULAR | Status: AC | PRN
  Administered 2013-08-17: 75 mL via INTRAVENOUS

## 2013-08-19 ENCOUNTER — Other Ambulatory Visit: Payer: Self-pay | Admitting: Family Medicine

## 2013-08-19 DIAGNOSIS — E041 Nontoxic single thyroid nodule: Secondary | ICD-10-CM

## 2013-08-23 ENCOUNTER — Ambulatory Visit
Admission: RE | Admit: 2013-08-23 | Discharge: 2013-08-23 | Disposition: A | Discharge status: Home / Self Care | Payer: No Typology Code available for payment source | Source: Ambulatory Visit | Attending: Family Medicine | Admitting: Family Medicine

## 2013-08-23 ENCOUNTER — Other Ambulatory Visit: Payer: BC Managed Care – PPO

## 2013-08-23 DIAGNOSIS — E041 Nontoxic single thyroid nodule: Secondary | ICD-10-CM

## 2013-08-25 ENCOUNTER — Encounter: Payer: Self-pay | Admitting: *Deleted

## 2013-08-25 NOTE — Progress Notes (Signed)
Received information from [facility] that Dr. Welton Flakes will be out of the office and we need to reschedule this pt.  Gave information to [facility] to obtain new appt from Dr. Welton Flakes since pt needs to be seen in Dec for insurance purposes.

## 2013-08-26 ENCOUNTER — Telehealth: Payer: Self-pay | Admitting: *Deleted

## 2013-08-26 NOTE — Telephone Encounter (Signed)
Received notification from am meeting w/ Executive team and both MD/s (KK & GM) to send this pt to Dr. Bertis Ruddy.  I have left a message for the pt to call me back so I can cancel her appt w/ Dr. Welton Flakes and make her aware that Tiffany will contact her to schedule w/ Dr. Bertis Ruddy.

## 2013-08-30 ENCOUNTER — Telehealth: Payer: Self-pay | Admitting: Hematology and Oncology

## 2013-08-30 ENCOUNTER — Telehealth: Payer: Self-pay | Admitting: *Deleted

## 2013-08-30 NOTE — Telephone Encounter (Signed)
Pt request to see Dr. Bertis Ruddy d/t Dr. Welton Flakes unable to see her on 09/06/13 since she will be out of town.  Took chart to Tiffany to schedule pt.

## 2013-08-30 NOTE — Telephone Encounter (Signed)
Left vm for pt to return call to r/s her appt with Dr. Bertis Ruddy d/t Dr. Welton Flakes will be out of town on 09/06/13.

## 2013-08-30 NOTE — Telephone Encounter (Signed)
S/W PT AND GVE NEW D/T 12/23 @C  9:30 W/DR.GORSUCH.

## 2013-09-06 ENCOUNTER — Other Ambulatory Visit: Payer: BC Managed Care – PPO

## 2013-09-06 ENCOUNTER — Ambulatory Visit: Payer: BC Managed Care – PPO

## 2013-09-06 ENCOUNTER — Ambulatory Visit: Payer: BC Managed Care – PPO | Admitting: Oncology

## 2013-09-07 ENCOUNTER — Encounter: Payer: Self-pay | Admitting: Hematology and Oncology

## 2013-09-07 ENCOUNTER — Ambulatory Visit: Payer: No Typology Code available for payment source

## 2013-09-07 ENCOUNTER — Telehealth: Payer: Self-pay | Admitting: Hematology and Oncology

## 2013-09-07 ENCOUNTER — Ambulatory Visit (HOSPITAL_BASED_OUTPATIENT_CLINIC_OR_DEPARTMENT_OTHER): Payer: BC Managed Care – PPO | Admitting: Hematology and Oncology

## 2013-09-07 VITALS — BP 152/95 | HR 73 | Temp 98.6°F | Resp 18 | Ht 65.0 in | Wt 223.4 lb

## 2013-09-07 DIAGNOSIS — I890 Lymphedema, not elsewhere classified: Secondary | ICD-10-CM

## 2013-09-07 DIAGNOSIS — Z853 Personal history of malignant neoplasm of breast: Secondary | ICD-10-CM

## 2013-09-07 DIAGNOSIS — G629 Polyneuropathy, unspecified: Secondary | ICD-10-CM | POA: Insufficient documentation

## 2013-09-07 DIAGNOSIS — M899 Disorder of bone, unspecified: Secondary | ICD-10-CM

## 2013-09-07 DIAGNOSIS — G609 Hereditary and idiopathic neuropathy, unspecified: Secondary | ICD-10-CM

## 2013-09-07 HISTORY — DX: Polyneuropathy, unspecified: G62.9

## 2013-09-07 MED ORDER — GABAPENTIN 300 MG PO CAPS: 300.0000 mg | ORAL_CAPSULE | Freq: Three times a day (TID) | ORAL | Status: DC

## 2013-09-07 NOTE — Progress Notes (Signed)
[facility] CONSULT NOTE  Patient Care Team: [name], MD as PCP - General (Family Medicine)  CHIEF COMPLAINTS/PURPOSE OF CONSULTATION:  History of bilateral breast cancer, for further management  HISTORY OF PRESENTING ILLNESS:  Cynthia Velasquez 62 y.o. female is here because of history of breast cancer. A review of her records extensively and collaborated the history with the patient and review of her records. According to the patient, her breast cancer was discovered from screening mammogram. Summary of oncologic history is as follows  Oncology History   Breast cancer of upper-outer quadrant of right female breast, (ER/PR positive, her2/Neu negative)   Primary site: Breast (Right)   Staging method: AJCC 7th Edition   Clinical: Stage IA (T1a, N0, cM0) signed by [name], MD on 09/07/2013 11:14 AM   Pathologic: Stage IA (T1a, N0, cM0) signed by [name], MD on 09/07/2013 11:14 AM   Summary: Stage IA (T1a, N0, cM0)  Breast cancer of upper-outer quadrant of left female breast (ER/PR/Her2Neu negative)   Primary site: Breast (Left)   Staging method: AJCC 7th Edition   Clinical: Stage IIA (T2, N0, cM0) signed by [name], MD on 09/07/2013 11:35 AM   Pathologic: Stage IIA (T2, N0, cM0) signed by [name], MD on 09/07/2013 11:35 AM   Summary: Stage IIA (T2, N0, cM0) Breast cancer of upper-outer quadrant of right female breast   Primary site: Breast (Right)   Staging method: AJCC 7th Edition   Clinical: Stage IA (T1a, N0, cM0) signed by [name], MD on 09/07/2013 11:14 AM   Pathologic: Stage IA (T1a, N0, cM0) signed by [name], MD on 09/07/2013 11:14 AM   Summary: Stage IA (T1a, N0, cM0)       Breast cancer of upper-outer quadrant of left female breast   07/10/2007 Procedure US biopsy showed invasive ductal carcinoma 0.8cm, Nottingham grade 3   07/30/2007 Surgery She had left breast lumpectomy and LN biopsy which showed high grade invasive ductal cancer  Nottingham grade 3, 2.7 cm, ER/PR/Her2 neu negative   11/24/2007 Surgery Patient elected for bilateral mastectomy   09/07/2008 - 03/08/2009 Chemotherapy dates are approximate: she received adriamycin and cytoxan followed by Taxol   03/08/2009 - 05/08/2009 Radiation Therapy dates are approximate, she received XRT     Breast cancer of upper-outer quadrant of right female breast   07/10/2006 Procedure stereotactic biopsy showed atypical hyperplasia   10/23/2006 Surgery right lumpectomy showed invasive ductal ca (Nottingham grade 1)  2mm and DCIS, ER/PR positive Her 2 negative   10/09/2007 - 10/08/2012 Chemotherapy Patient was placed on Tamoxifen   The patient relocated to [place] to be closer to family. Over the last year, she complained of intermittent chest wall discomfort. In 2011, she had revision surgery to her mastectomy scar and since then, she is complaining of pain at the surgical site. She also have intermittent swelling of her right axilla and had ultrasound evaluation that came back negative. She also have nonspecific weight loss over the past year.  In early December 2014, chest CT scan evaluation that came back negative The pain in her axilla is quite severe to the point she needs to take pain medicine. She completed 5 years worth of tamoxifen for her early-stage right breast cancer last year.  For her aggressive locally advanced left breast cancer, she received adjuvant chemotherapy and radiation therapy.  In terms of breast cancer risk profile:  She menarched at early age of 63 and went to menopause in her  late 12s  She had 1 pregnancy, her first child was born at age 47 She breast-fed her daughter for approximately 8 months.  She received both control pills for approximately 3-4 years. She was never exposed to fertility medications or hormone replacement therapy.  The patient was adopted and she is unaware of family history of her biological parents MEDICAL HISTORY:  Past  Medical History  Diagnosis Date  . Cancer   . Breast cancer   . Lumbar stenosis   . Hyperlipidemia     takes Simvastatin daily  . Hypertension     takes Hyzaar  . History of migraine   . Arthritis   . Joint pain   . Joint swelling   . GERD (gastroesophageal reflux disease)     doesn't take any meds for this  . History of bladder infections   . Gallstones   . Neuropathy 09/07/2013    SURGICAL HISTORY: Past Surgical History  Procedure Laterality Date  . Double mastectomy     . Tonsillectomy    . Abdominal hysterectomy    . Hernia repair      umbilical  . Surgery for hiatal hernia    . Cholecystectomy    . Bone spur removed from left foot    . Right knee arthroscopy    . Right shoulder arthroscopy    . Colonoscopy    . Total hip arthroplasty Left 02/12/2013  . Total hip arthroplasty Left 02/12/2013    Procedure: LEFT TOTAL HIP ARTHROPLASTY;  Surgeon: Nestor Lewandowsky, MD;  Location: [facility] OR;  Service: Orthopedics;  Laterality: Left;  . Total hip arthroplasty Right 05/03/2013    Procedure: TOTAL HIP ARTHROPLASTY;  Surgeon: Nestor Lewandowsky, MD;  Location: [facility] OR;  Service: Orthopedics;  Laterality: Right;    SOCIAL HISTORY: History   Social History  . Marital Status: Single    Spouse Name: N/A    Number of Children: N/A  . Years of Education: N/A   Occupational History  . Not on file.   Social History Main Topics  . Smoking status: Never Smoker   . Smokeless tobacco: Never Used  . Alcohol Use: No  . Drug Use: No  . Sexual Activity: No   Other Topics Concern  . Not on file   Social History Narrative  . No narrative on file    FAMILY HISTORY: Family History  Problem Relation Age of Onset  . Adopted: Yes    ALLERGIES:  is allergic to codeine and vicodin.  MEDICATIONS:  Current Outpatient Prescriptions  Medication Sig Dispense Refill  . diphenhydrAMINE (BENADRYL) 25 MG tablet Take 25 mg by mouth every 6 (six) hours as needed for itching.      .  losartan-hydrochlorothiazide (HYZAAR) 50-12.5 MG per tablet Take 1-1.5 tablets by mouth 2 (two) times daily. Take one tablet every morning and take half of a tablet every evening      . methocarbamol (ROBAXIN) 500 MG tablet Take 1 tablet (500 mg total) by mouth 2 (two) times daily with a meal.  1 tablet  0  . oxyCODONE-acetaminophen (ROXICET) 5-325 MG per tablet Take 1 tablet by mouth every 4 (four) hours as needed for pain.  60 tablet  0  . promethazine (PHENERGAN) 25 MG tablet Take 12.5-25 mg by mouth every 6 (six) hours as needed for nausea.      . simvastatin (ZOCOR) 20 MG tablet Take 20 mg by mouth daily.      . SUMAtriptan (IMITREX) 50 MG  tablet Take 50 mg by mouth every 2 (two) hours as needed for migraine.      . gabapentin (NEURONTIN) 300 MG capsule Take 1 capsule (300 mg total) by mouth 3 (three) times daily.  90 capsule  1   No current facility-administered medications for this visit.    REVIEW OF SYSTEMS:   Constitutional: Denies fevers, chills or abnormal night sweats Eyes: Denies blurriness of vision, double vision or watery eyes Ears, nose, mouth, throat, and face: Denies mucositis or sore throat Respiratory: Denies cough, dyspnea or wheezes Cardiovascular: Denies palpitation, chest discomfort or lower extremity swelling Gastrointestinal:  Denies nausea, heartburn or change in bowel habits Skin: Denies abnormal skin rashes. She complained of severe tenderness on palpation in the right side of her chest wall area at previous sites of surgical resection Lymphatics: Denies new lymphadenopathy or easy bruising Neurological:Denies numbness, tingling or new weaknesses Behavioral/Psych: Mood is stable, no new changes  All other systems were reviewed with the patient and are negative.  PHYSICAL EXAMINATION: ECOG PERFORMANCE STATUS: 1 - Symptomatic but completely ambulatory  Filed Vitals:   09/07/13 0955  BP: 152/95  Pulse: 73  Temp: 98.6 F (37 C)  Resp: 18   Filed Weights    09/07/13 0955  Weight: 223 lb 6.4 oz (101.334 kg)    GENERAL:alert, no distress and comfortable SKIN: skin color, texture, turgor are normal, no rashes or significant lesions EYES: normal, conjunctiva are pink and non-injected, sclera clear OROPHARYNX:no exudate, no erythema and lips, buccal mucosa, and tongue normal  NECK: supple, thyroid normal size, non-tender, without nodularity LYMPH:  no palpable lymphadenopathy in the cervical, axillary or inguinal LUNGS: clear to auscultation and percussion with normal breathing effort HEART: regular rate & rhythm and no murmurs and no lower extremity edema ABDOMEN:abdomen soft, non-tender and normal bowel sounds Musculoskeletal:no cyanosis of digits and no clubbing  PSYCH: alert & oriented x 3 with fluent speech NEURO: no focal motor/sensory deficits Bilateral chest wall examination were performed. Well-healed mastectomy scar. There is significant tenderness some palpation at the right lower quadrant at previous resection site LABORATORY DATA:  I have reviewed the data as listed. Her last hemoglobin several months ago was a bit low related to postoperative state from hip replacement surgery  RADIOGRAPHIC STUDIES: I reviewed the CT scan imaging study in great detail I have personally reviewed the radiological images as listed and agreed with the findings in the report. Ct Chest W Contrast  08/17/2013   CLINICAL DATA:  Right rib pain, history of bilateral breast carcinoma with mastectomies, chemotherapy, and radiation previously  EXAM: CT CHEST WITH CONTRAST  TECHNIQUE: Multidetector CT imaging of the chest was performed during intravenous contrast administration.  CONTRAST:  75mL OMNIPAQUE IOHEXOL 300 MG/ML  SOLN  COMPARISON:  Chest x-ray of 02/11/2013  BUN and creatinine were obtained on site at [facility] at [address]Results: BUN 11 mg/dL, Creatinine 0.9 mg/dL.  FINDINGS: No suspicious lung nodules are seen and there is no  evidence of pleural effusion. Minimal opacity is noted posteriorly within the superior segment of the right lower lobe which on the sagittal view appears to correspond to scarring along the major fissure posteriorly. Mild linear scarring or atelectasis is noted in the right lower lobe as well. There is degenerative spurring throughout the mid to lower thoracic spine with mild curvature convex to the left. No compression deformity is seen and no lytic or blastic lesion is noted. No abnormality is seen of the ribs. The  central airway is patent.  On soft tissue window images, a small right thyroid nodule is noted upper proximal 12 mm in diameter. Ultrasound of the thyroid could be performed if further assessment is warranted. The remainder of the thyroid gland is somewhat inhomogeneous. Surgical clips are noted in both anterior chest walls after bilateral mastectomies. No mediastinal or hilar adenopathy is seen, and no axillary adenopathy is noted. There is no evidence of local recurrence of breast carcinoma. Images through the upper abdomen show prior cholecystectomy. No significant abnormality is seen.  IMPRESSION: 1. No abnormality is seen on CT of the chest. No rib abnormality is noted. There are degenerative changes in the mid to lower thoracic spine. 2. Changes of bilateral mastectomies. No evidence of local recurrence of breast carcinoma. No adenopathy is seen. 3. 12 mm right thyroid nodule with inhomogeneity of the thyroid gland. Consider ultrasound of the thyroid if warranted clinically.   Electronically Signed   By: Dwyane Dee M.D.   On: 08/17/2013 09:29   US Soft Tissue Head/neck  08/23/2013   CLINICAL DATA:  Thyroid nodule seen on CT scan of 08/17/2013  EXAM: THYROID ULTRASOUND  TECHNIQUE: Ultrasound examination of the thyroid gland and adjacent soft tissues was performed.  COMPARISON:  08/17/2013  FINDINGS: Right thyroid lobe  Measurements: 4.3 x 2.2 x 1.7 cm. A 1.3 x 1.1 x 1.1 cm solid right mid  thyroid nodule is present. There is also a solid 0.5 x 0.2 x 0.4 cm right mid gland nodule.  Left thyroid lobe  Measurements: 4.3 x 1.6 x 1.7 cm. A solid 1.3 x 1.0 x 0.9 cm nodule is present in the mid to lower left lobe.  Isthmus  Thickness: 0.4 cm.  0.5 x 0.3 x 0.4 cm cystic nodule.  Lymphadenopathy  None visualized.  IMPRESSION: 1. Bilateral thyroid nodules, measuring up to 1.3 cm in long axis. No definite microcalcifications. Current guidelines do not call for fine needle aspiration of nodules of this size.   Electronically Signed   By: Herbie Baltimore M.D.   On: 08/23/2013 11:19    ASSESSMENT:  History of bilateral breast cancer status post mastectomy  PLAN:  #1 bilateral breast cancer The patient has completed adjuvant treatment. I would continue history and physical examination only in the future without additional workup. I am satisfied the patient has no evidence of disease recurrence Due to history of triple receptor negative breast cancer and bilateral breast cancer, the patient would qualify for genetic screening. I will discuss this with her in her next visit #2 chest wall pain This is due to peripheral neuropathy. I recommend a trial of gabapentin. Risks, benefits & side effects of gabapentin would discuss with the patient and she agreed to proceed. The patient is educated about risk of interruption with her other medications. #3 intermittent right arm swelling This is due to lymphedema. Overall I do not feel that the patient has anything to suggest cancer recurrence. I recommend consideration for physical therapy but the patient elected for observation now #4 history of intermittent bone pain I recommend the patient to take high-dose vitamin D over-the-counter supplements.  All questions were answered. The patient knows to call the clinic with any problems, questions or concerns. I spent 55 minutes counseling the patient face to face. The total time spent in the appointment was 60  minutes and more than 50% was on counseling.     Canyon Willow, MD 09/07/2013 11:37 AM

## 2013-09-07 NOTE — Progress Notes (Signed)
Checked in new patient with no financial issues. She has not been to [place]. °

## 2013-09-07 NOTE — Telephone Encounter (Signed)
gave pt appt MD only for January 2015

## 2013-09-15 ENCOUNTER — Encounter: Payer: Self-pay | Admitting: *Deleted

## 2013-09-15 NOTE — Progress Notes (Signed)
Mailed after appt letter to pt. 

## 2013-10-11 ENCOUNTER — Ambulatory Visit: Payer: BC Managed Care – PPO | Admitting: Hematology and Oncology

## 2013-10-15 ENCOUNTER — Telehealth: Payer: Self-pay | Admitting: Hematology and Oncology

## 2013-10-15 NOTE — Telephone Encounter (Signed)
pt called and r/s MD visit to february 2015

## 2013-10-21 ENCOUNTER — Encounter: Payer: Self-pay | Admitting: Hematology and Oncology

## 2013-10-21 ENCOUNTER — Telehealth: Payer: Self-pay | Admitting: Hematology and Oncology

## 2013-10-21 ENCOUNTER — Ambulatory Visit (HOSPITAL_BASED_OUTPATIENT_CLINIC_OR_DEPARTMENT_OTHER): Payer: BC Managed Care – PPO | Admitting: Hematology and Oncology

## 2013-10-21 VITALS — BP 155/93 | HR 81 | Temp 97.7°F | Resp 18 | Ht 65.0 in | Wt 224.8 lb

## 2013-10-21 DIAGNOSIS — Z853 Personal history of malignant neoplasm of breast: Secondary | ICD-10-CM

## 2013-10-21 DIAGNOSIS — R071 Chest pain on breathing: Secondary | ICD-10-CM

## 2013-10-21 DIAGNOSIS — C50911 Malignant neoplasm of unspecified site of right female breast: Secondary | ICD-10-CM

## 2013-10-21 DIAGNOSIS — C50912 Malignant neoplasm of unspecified site of left female breast: Principal | ICD-10-CM

## 2013-10-21 DIAGNOSIS — Z1501 Genetic susceptibility to malignant neoplasm of breast: Secondary | ICD-10-CM

## 2013-10-21 HISTORY — DX: Malignant neoplasm of unspecified site of right female breast: C50.911

## 2013-10-21 NOTE — Telephone Encounter (Signed)
gv and printed appt sched and avs for pt for Aug °

## 2013-10-21 NOTE — Progress Notes (Signed)
[facility] OFFICE PROGRESS NOTE  Patient Care Team: Cynthia Lass, MD as PCP - General (Family Medicine)  DIAGNOSIS: Bilateral breast cancer status post mastectomy, positive history of BRCA mutation  SUMMARY OF ONCOLOGIC HISTORY: Oncology History   Breast cancer of upper-outer quadrant of right female breast, (ER/PR positive, her2/Neu negative)   Primary site: Breast (Right)   Staging method: AJCC 7th Edition   Clinical: Stage IA (T1a, N0, cM0) signed by Cynthia Lark, MD on 09/07/2013 11:14 AM   Pathologic: Stage IA (T1a, N0, cM0) signed by Cynthia Lark, MD on 09/07/2013 11:14 AM   Summary: Stage IA (T1a, N0, cM0)  Breast cancer of upper-outer quadrant of left female breast (ER/PR/Her2Neu negative)   Primary site: Breast (Left)   Staging method: AJCC 7th Edition   Clinical: Stage IIA (T2, N0, cM0) signed by Cynthia Lark, MD on 09/07/2013 11:35 AM   Pathologic: Stage IIA (T2, N0, cM0) signed by Cynthia Lark, MD on 09/07/2013 11:35 AM   Summary: Stage IIA (T2, N0, cM0) Breast cancer of upper-outer quadrant of right female breast   Primary site: Breast (Right)   Staging method: AJCC 7th Edition   Clinical: Stage IA (T1a, N0, cM0) signed by Cynthia Lark, MD on 09/07/2013 11:14 AM   Pathologic: Stage IA (T1a, N0, cM0) signed by Cynthia Lark, MD on 09/07/2013 11:14 AM   Summary: Stage IA (T1a, N0, cM0)       Breast cancer of upper-outer quadrant of left female breast   07/10/2007 Procedure US biopsy showed invasive ductal carcinoma 0.8cm, Nottingham grade 3   07/30/2007 Surgery She had left breast lumpectomy and LN biopsy which showed high grade invasive ductal cancer Nottingham grade 3, 2.7 cm, ER/PR/Her2 neu negative   11/24/2007 Surgery Patient elected for bilateral mastectomy   09/07/2008 - 03/08/2009 Chemotherapy dates are approximate: she received adriamycin and cytoxan followed by Taxol   03/08/2009 - 05/08/2009 Radiation Therapy dates are approximate, she received XRT      Breast cancer of upper-outer quadrant of right female breast   07/10/2006 Procedure stereotactic biopsy showed atypical hyperplasia   10/23/2006 Surgery right lumpectomy showed invasive ductal ca (Nottingham grade 1)  51m and DCIS, ER/PR positive Her 2 negative   10/09/2007 - 10/08/2012 Chemotherapy Patient was placed on Tamoxifen    INTERVAL HISTORY: TDeidre Ala89y.o. female returns for further followup. Previously I started her on Neurontin for intermittent neuropathic pain on the chest wall. She's able to tolerate 300 mg twice a day. She complained of mild sedation. She also saw taking vitamin D as suggested. Denies any new bone pain.  I have reviewed the past medical history, past surgical history, social history and family history with the patient and they are unchanged from previous note.  ALLERGIES:  is allergic to codeine and vicodin.  MEDICATIONS:  Current Outpatient Prescriptions  Medication Sig Dispense Refill  . diphenhydrAMINE (BENADRYL) 25 MG tablet Take 25 mg by mouth every 6 (six) hours as needed for itching.      . gabapentin (NEURONTIN) 300 MG capsule Take 1 capsule (300 mg total) by mouth 3 (three) times daily.  90 capsule  1  . losartan-hydrochlorothiazide (HYZAAR) 50-12.5 MG per tablet Take 1-1.5 tablets by mouth 2 (two) times daily. Take one tablet every morning and take half of a tablet every evening      . methocarbamol (ROBAXIN) 500 MG tablet Take 1 tablet (500 mg total) by mouth 2 (two) times daily with a meal.  1 tablet  0  . promethazine (PHENERGAN) 25 MG tablet Take 12.5-25 mg by mouth every 6 (six) hours as needed for nausea.      . simvastatin (ZOCOR) 20 MG tablet Take 20 mg by mouth daily.      . SUMAtriptan (IMITREX) 50 MG tablet Take 50 mg by mouth every 2 (two) hours as needed for migraine.       No current facility-administered medications for this visit.    REVIEW OF SYSTEMS:   Behavioral/Psych: Mood is stable, no new changes  All other systems  were reviewed with the patient and are negative.  PHYSICAL EXAMINATION: ECOG PERFORMANCE STATUS: 0 - Asymptomatic  Filed Vitals:   10/21/13 1450  BP: 155/93  Pulse: 81  Temp: 97.7 F (36.5 C)  Resp: 18   Filed Weights   10/21/13 1450  Weight: 224 lb 12.8 oz (101.969 kg)    GENERAL:alert, no distress and comfortable Musculoskeletal:no cyanosis of digits and no clubbing  NEURO: alert & oriented x 3 with fluent speech, no focal motor/sensory deficits  LABORATORY DATA:  I have reviewed the data as listed    Component Value Date/Time   NA 138 05/04/2013 0430   K 3.5 05/04/2013 0430   CL 101 05/04/2013 0430   CO2 28 05/04/2013 0430   GLUCOSE 126* 05/04/2013 0430   BUN 8 05/04/2013 0430   CREATININE 0.83 05/04/2013 0430   CALCIUM 8.5 05/04/2013 0430   GFRNONAA 75* 05/04/2013 0430   GFRAA 86* 05/04/2013 0430    No results found for this basename: SPEP, UPEP,  kappa and lambda light chains    Lab Results  Component Value Date   WBC 14.0* 05/06/2013   NEUTROABS 4.9 04/29/2013   HGB 9.9* 05/06/2013   HCT 30.7* 05/06/2013   MCV 78.5 05/06/2013   PLT 264 05/06/2013      Chemistry      Component Value Date/Time   NA 138 05/04/2013 0430   K 3.5 05/04/2013 0430   CL 101 05/04/2013 0430   CO2 28 05/04/2013 0430   BUN 8 05/04/2013 0430   CREATININE 0.83 05/04/2013 0430      Component Value Date/Time   CALCIUM 8.5 05/04/2013 0430     ASSESSMENT & PLAN:  #1 bilateral breast cancer #2 positive BRCA mutation Apparently the patient had underwent genetic testing and tested positive for BRCA mutation. She could not remember which one was positive. Previously patient had hysterectomy but she could not remember whether she had total hysterectomy. I recommend CT scan of the pelvis to evaluate her pelvic organs. The patient is interested for prophylactic oophorectomy if she still had ovaries remaining. For her breast cancer, I recommend history, physical examination and evaluation in 6 months. I  explained to the patient why she would not require excessive testing in the future due to lack of symptoms. #3 intermittent chest wall pain I recommend increasing dose of gabapentin but the patient declined #4 preventive care The patient is due for colonoscopy. She will think about it.  Orders Placed This Encounter  Procedures  . CT Pelvis Wo Contrast    Standing Status: Future     Number of Occurrences:      Standing Expiration Date: 10/21/2014    Order Specific Question:  Reason for Exam (SYMPTOM  OR DIAGNOSIS REQUIRED)    Answer:  BRCA positive breast cancer pt, had hysterectomy in the past, not sure if ovaries are present, interested for oophorectomy    Order Specific Question:  Preferred imaging location?  Answer:  [facility]   All questions were answered. The patient knows to call the clinic with any problems, questions or concerns. No barriers to learning was detected. I spent 15 minutes counseling the patient face to face. The total time spent in the appointment was 20 minutes and more than 50% was on counseling and review of test results     [facility], [place], MD 10/21/2013 3:20 PM

## 2013-11-11 ENCOUNTER — Ambulatory Visit (HOSPITAL_COMMUNITY): Payer: BC Managed Care – PPO

## 2013-11-12 ENCOUNTER — Ambulatory Visit (HOSPITAL_COMMUNITY): Payer: BC Managed Care – PPO

## 2014-01-28 ENCOUNTER — Encounter (HOSPITAL_BASED_OUTPATIENT_CLINIC_OR_DEPARTMENT_OTHER): Payer: Self-pay | Admitting: Emergency Medicine

## 2014-01-28 ENCOUNTER — Emergency Department (HOSPITAL_BASED_OUTPATIENT_CLINIC_OR_DEPARTMENT_OTHER): Payer: BC Managed Care – PPO

## 2014-01-28 ENCOUNTER — Emergency Department (HOSPITAL_BASED_OUTPATIENT_CLINIC_OR_DEPARTMENT_OTHER)
Admission: EM | Admit: 2014-01-28 | Discharge: 2014-01-28 | Disposition: A | Discharge status: Home / Self Care | Payer: BC Managed Care – PPO | Attending: Emergency Medicine | Admitting: Emergency Medicine

## 2014-01-28 DIAGNOSIS — Y929 Unspecified place or not applicable: Secondary | ICD-10-CM | POA: Insufficient documentation

## 2014-01-28 DIAGNOSIS — Z87440 Personal history of urinary (tract) infections: Secondary | ICD-10-CM | POA: Insufficient documentation

## 2014-01-28 DIAGNOSIS — Z8501 Personal history of malignant neoplasm of esophagus: Secondary | ICD-10-CM | POA: Insufficient documentation

## 2014-01-28 DIAGNOSIS — Z8739 Personal history of other diseases of the musculoskeletal system and connective tissue: Secondary | ICD-10-CM | POA: Insufficient documentation

## 2014-01-28 DIAGNOSIS — Z79899 Other long term (current) drug therapy: Secondary | ICD-10-CM | POA: Insufficient documentation

## 2014-01-28 DIAGNOSIS — Z859 Personal history of malignant neoplasm, unspecified: Secondary | ICD-10-CM | POA: Insufficient documentation

## 2014-01-28 DIAGNOSIS — G43909 Migraine, unspecified, not intractable, without status migrainosus: Secondary | ICD-10-CM | POA: Insufficient documentation

## 2014-01-28 DIAGNOSIS — E785 Hyperlipidemia, unspecified: Secondary | ICD-10-CM | POA: Insufficient documentation

## 2014-01-28 DIAGNOSIS — I10 Essential (primary) hypertension: Secondary | ICD-10-CM | POA: Insufficient documentation

## 2014-01-28 DIAGNOSIS — X58XXXA Exposure to other specified factors, initial encounter: Secondary | ICD-10-CM | POA: Insufficient documentation

## 2014-01-28 DIAGNOSIS — Z8719 Personal history of other diseases of the digestive system: Secondary | ICD-10-CM | POA: Insufficient documentation

## 2014-01-28 DIAGNOSIS — T148XXA Other injury of unspecified body region, initial encounter: Secondary | ICD-10-CM

## 2014-01-28 DIAGNOSIS — IMO0002 Reserved for concepts with insufficient information to code with codable children: Secondary | ICD-10-CM | POA: Insufficient documentation

## 2014-01-28 DIAGNOSIS — Y939 Activity, unspecified: Secondary | ICD-10-CM | POA: Insufficient documentation

## 2014-01-28 LAB — BASIC METABOLIC PANEL
BUN: 17 mg/dL (ref 6–23)
CALCIUM: 9.5 mg/dL (ref 8.4–10.5)
CO2: 26 meq/L (ref 19–32)
Chloride: 102 mEq/L (ref 96–112)
Creatinine, Ser: 1 mg/dL (ref 0.50–1.10)
GFR, EST AFRICAN AMERICAN: 68 mL/min — AB (ref >90)
GFR, EST NON AFRICAN AMERICAN: 59 mL/min — AB (ref >90)
Glucose, Bld: 100 mg/dL — ABNORMAL HIGH (ref 70–99)
Potassium: 4 mEq/L (ref 3.7–5.3)
SODIUM: 141 meq/L (ref 137–147)

## 2014-01-28 LAB — CBC WITH DIFFERENTIAL/PLATELET
BASOS PCT: 0 % (ref 0–1)
Basophils Absolute: 0 10*3/uL (ref 0.0–0.1)
EOS PCT: 4 % (ref 0–5)
Eosinophils Absolute: 0.3 10*3/uL (ref 0.0–0.7)
HCT: 40.6 % (ref 36.0–46.0)
Hemoglobin: 13.2 g/dL (ref 12.0–15.0)
LYMPHS PCT: 36 % (ref 12–46)
Lymphs Abs: 2.9 10*3/uL (ref 0.7–4.0)
MCH: 26.8 pg (ref 26.0–34.0)
MCHC: 32.5 g/dL (ref 30.0–36.0)
MCV: 82.4 fL (ref 78.0–100.0)
Monocytes Absolute: 0.8 10*3/uL (ref 0.1–1.0)
Monocytes Relative: 10 % (ref 3–12)
Neutro Abs: 3.9 10*3/uL (ref 1.7–7.7)
Neutrophils Relative %: 50 % (ref 43–77)
Platelets: 294 10*3/uL (ref 150–400)
RBC: 4.93 MIL/uL (ref 3.87–5.11)
RDW: 16.3 % — AB (ref 11.5–15.5)
WBC: 7.9 10*3/uL (ref 4.0–10.5)

## 2014-01-28 MED ORDER — OXYCODONE-ACETAMINOPHEN 5-325 MG PO TABS: 2.0000 | ORAL_TABLET | ORAL | Status: DC | PRN

## 2014-01-28 NOTE — ED Notes (Signed)
Pt reports that she was at her PCP today and was told that her D-dimer was elevated.  Pt sent here to r/o DVT.  States that she has (R) leg pain x 1-2 days.  Denies injury.

## 2014-01-28 NOTE — ED Provider Notes (Signed)
CSN: [id number]     Arrival date & time 01/28/14  1827 History   None    Chief Complaint  Patient presents with  . DVT     (Consider location/radiation/quality/duration/timing/severity/associated sxs/prior Treatment) Patient is a 63 y.o. female presenting with leg pain. The history is provided by the patient. No language interpreter was used.  Leg Pain Location:  Leg Injury: no   Leg location:  R leg Pain details:    Quality:  Aching   Severity:  No pain   Onset quality:  Gradual   Timing:  Constant   Progression:  Worsening Chronicity:  New Tetanus status:  Out of date Prior injury to area:  No Relieved by:  Nothing Worsened by:  Nothing tried Associated symptoms: no back pain   Risk factors: no recent illness   Pt went on a cruise 2 weeks ago.   Pt reports pain in her right leg and swelling.   Pt saw her MD and had a positive ddimer and was sent here for evaluation for dvt  Past Medical History  Diagnosis Date  . Cancer   . Breast cancer   . Lumbar stenosis   . Hyperlipidemia     takes Simvastatin daily  . Hypertension     takes Hyzaar  . History of migraine   . Arthritis   . Joint pain   . Joint swelling   . GERD (gastroesophageal reflux disease)     doesn't take any meds for this  . History of bladder infections   . Gallstones   . Neuropathy 09/07/2013  . Bilateral breast cancer 10/21/2013   Past Surgical History  Procedure Laterality Date  . Double mastectomy     . Tonsillectomy    . Abdominal hysterectomy    . Hernia repair      umbilical  . Surgery for hiatal hernia    . Cholecystectomy    . Bone spur removed from left foot    . Right knee arthroscopy    . Right shoulder arthroscopy    . Colonoscopy    . Total hip arthroplasty Left 02/12/2013  . Total hip arthroplasty Left 02/12/2013    Procedure: LEFT TOTAL HIP ARTHROPLASTY;  Surgeon: Kerin Salen, MD;  Location: [facility];  Service: Orthopedics;  Laterality: Left;  . Total hip arthroplasty Right  05/03/2013    Procedure: TOTAL HIP ARTHROPLASTY;  Surgeon: Kerin Salen, MD;  Location: [facility];  Service: Orthopedics;  Laterality: Right;   Family History  Problem Relation Age of Onset  . Adopted: Yes   History  Substance Use Topics  . Smoking status: Never Smoker   . Smokeless tobacco: Never Used  . Alcohol Use: No   OB History   Grav Para Term Preterm Abortions TAB SAB Ect Mult Living                 Review of Systems  Musculoskeletal: Negative for back pain.  All other systems reviewed and are negative.     Allergies  Codeine and Vicodin  Home Medications   Prior to Admission medications   Medication Sig Start Date End Date Taking? Authorizing Provider  diphenhydrAMINE (BENADRYL) 25 MG tablet Take 25 mg by mouth every 6 (six) hours as needed for itching.    Historical Provider, MD  losartan-hydrochlorothiazide (HYZAAR) 50-12.5 MG per tablet Take 1-1.5 tablets by mouth 2 (two) times daily. Take one tablet every morning and take half of a tablet every evening    Historical  Provider, MD  promethazine (PHENERGAN) 25 MG tablet Take 12.5-25 mg by mouth every 6 (six) hours as needed for nausea.    Historical Provider, MD  simvastatin (ZOCOR) 20 MG tablet Take 20 mg by mouth daily.    Historical Provider, MD  SUMAtriptan (IMITREX) 50 MG tablet Take 50 mg by mouth every 2 (two) hours as needed for migraine.    Historical Provider, MD   BP 171/125  Pulse 77  Temp(Src) 98.3 F (36.8 C) (Oral)  Resp 18  Ht 5\' 4"  (1.626 m)  Wt 240 lb (108.863 kg)  BMI 41.18 kg/m2  SpO2 96% Physical Exam  Nursing note and vitals reviewed. Constitutional: She is oriented to person, place, and time. She appears well-developed and well-nourished.  HENT:  Head: Normocephalic.  Eyes: EOM are normal. Pupils are equal, round, and reactive to light.  Neck: Normal range of motion.  Cardiovascular: Normal rate and normal heart sounds.   Pulmonary/Chest: Effort normal.  Abdominal: She exhibits no  distension.  Musculoskeletal: She exhibits tenderness.  Tender upper right leg  Neurological: She is alert and oriented to person, place, and time.  Skin: Skin is warm.  Psychiatric: She has a normal mood and affect.    ED Course  Procedures (including critical care time) Labs Review Labs Reviewed - No data to display  Imaging Review No results found.   EKG Interpretation None      MDM Doppler no dvt.   I suspect pain is second to bursitis    Final diagnoses:  Muscle strain   Hydrocodone  See your Physicain  For recheck in 2-3 days.      Fransico Meadow, PA-C 01/28/14 [place], [place] 01/28/14 2031

## 2014-01-28 NOTE — Discharge Instructions (Signed)
Myalgia, Adult Myalgia is the medical term for muscle pain. It is a symptom of many things. Nearly everyone at some time in their life has this. The most common cause for muscle pain is overuse or straining and more so when you are not in shape. Injuries and muscle bruises cause myalgias. Muscle pain without a history of injury can also be caused by a virus. It frequently comes along with the flu. Myalgia not caused by muscle strain can be present in a large number of infectious diseases. Some autoimmune diseases like lupus and fibromyalgia can cause muscle pain. Myalgia may be mild, or severe. SYMPTOMS  The symptoms of myalgia are simply muscle pain. Most of the time this is short lived and the pain goes away without treatment. DIAGNOSIS  Myalgia is diagnosed by your caregiver by taking your history. This means you tell him when the problems began, what they are, and what has been happening. If this has not been a long term problem, your caregiver may want to watch for a while to see what will happen. If it has been long term, they may want to do additional testing. TREATMENT  The treatment depends on what the underlying cause of the muscle pain is. Often anti-inflammatory medications will help. HOME CARE INSTRUCTIONS  If the pain in your muscles came from overuse, slow down your activities until the problems go away.  Myalgia from overuse of a muscle can be treated with alternating hot and cold packs on the muscle affected or with cold for the first couple days. If either heat or cold seems to make things worse, stop their use.  Apply ice to the sore area for 15-20 minutes, 03-04 times per day, while awake for the first 2 days of muscle soreness, or as directed. Put the ice in a plastic bag and place a towel between the bag of ice and your skin.  Only take over-the-counter or prescription medicines for pain, discomfort, or fever as directed by your caregiver.  Regular gentle exercise may help if  you are not active.  Stretching before strenuous exercise can help lower the risk of myalgia. It is normal when beginning an exercise regimen to feel some muscle pain after exercising. Muscles that have not been used frequently will be sore at first. If the pain is extreme, this may mean injury to a muscle. SEEK MEDICAL CARE IF:  You have an increase in muscle pain that is not relieved with medication.  You begin to run a temperature.  You develop nausea and vomiting.  You develop a stiff and painful neck.  You develop a rash.  You develop muscle pain after a tick bite.  You have continued muscle pain while working out even after you are in good condition. SEEK IMMEDIATE MEDICAL CARE IF: Any of your problems are getting worse and medications are not helping. MAKE SURE YOU:   Understand these instructions.  Will watch your condition.  Will get help right away if you are not doing well or get worse. Document Released: 07/25/2006 Document Revised: 11/25/2011 Document Reviewed: 10/14/2006 [facility] Patient Information 2014 [place], [place].  Muscle Strain A muscle strain is an injury that occurs when a muscle is stretched beyond its normal length. Usually a small number of muscle fibers are torn when this happens. Muscle strain is rated in degrees. First-degree strains have the least amount of muscle fiber tearing and pain. Second-degree and third-degree strains have increasingly more tearing and pain.  Usually, recovery from muscle  strain takes 1 2 weeks. Complete healing takes 5 6 weeks.  CAUSES  Muscle strain happens when a sudden, violent force placed on a muscle stretches it too far. This may occur with lifting, sports, or a fall.  RISK FACTORS Muscle strain is especially common in athletes.  SIGNS AND SYMPTOMS At the site of the muscle strain, there may be:  Pain.  Bruising.  Swelling.  Difficulty using the muscle due to pain or lack of normal function. DIAGNOSIS  Your  health care provider will perform a physical exam and ask about your medical history. TREATMENT  Often, the best treatment for a muscle strain is resting, icing, and applying cold compresses to the injured area.  HOME CARE INSTRUCTIONS   Use the PRICE method of treatment to promote muscle healing during the first 2 3 days after your injury. The PRICE method involves:  Protecting the muscle from being injured again.  Restricting your activity and resting the injured body part.  Icing your injury. To do this, put ice in a plastic bag. Place a towel between your skin and the bag. Then, apply the ice and leave it on from 15 20 minutes each hour. After the third day, switch to moist heat packs.  Apply compression to the injured area with a splint or elastic bandage. Be careful not to wrap it too tightly. This may interfere with blood circulation or increase swelling.  Elevate the injured body part above the level of your heart as often as you can.  Only take over-the-counter or prescription medicines for pain, discomfort, or fever as directed by your health care provider.  Warming up prior to exercise helps to prevent future muscle strains. SEEK MEDICAL CARE IF:   You have increasing pain or swelling in the injured area.  You have numbness, tingling, or a significant loss of strength in the injured area. MAKE SURE YOU:   Understand these instructions.  Will watch your condition.  Will get help right away if you are not doing well or get worse. Document Released: 09/02/2005 Document Revised: 06/23/2013 Document Reviewed: 04/01/2013 [facility] Patient Information 2014 [place], [place].

## 2014-01-30 NOTE — ED Provider Notes (Signed)
Medical screening examination/treatment/procedure(s) were performed by non-physician practitioner and as supervising physician I was immediately available for consultation/collaboration.   EKG Interpretation None        Houston Siren III, MD 01/30/14 [phone]

## 2014-04-21 ENCOUNTER — Ambulatory Visit: Payer: BC Managed Care – PPO | Admitting: Hematology and Oncology

## 2014-04-28 ENCOUNTER — Telehealth: Payer: Self-pay | Admitting: Hematology and Oncology

## 2014-04-28 NOTE — Telephone Encounter (Signed)
returned pt's call and gv her new appt for 8/25 @ 8:30am. d/t per pt due to she needs tuesday AM.

## 2014-05-10 ENCOUNTER — Telehealth: Payer: Self-pay | Admitting: Hematology and Oncology

## 2014-05-10 ENCOUNTER — Ambulatory Visit (HOSPITAL_BASED_OUTPATIENT_CLINIC_OR_DEPARTMENT_OTHER): Payer: BC Managed Care – PPO | Admitting: Hematology and Oncology

## 2014-05-10 ENCOUNTER — Encounter: Payer: Self-pay | Admitting: Hematology and Oncology

## 2014-05-10 VITALS — BP 156/77 | HR 62 | Temp 98.1°F | Resp 19 | Ht 64.0 in | Wt 230.6 lb

## 2014-05-10 DIAGNOSIS — C50412 Malignant neoplasm of upper-outer quadrant of left female breast: Secondary | ICD-10-CM

## 2014-05-10 DIAGNOSIS — G629 Polyneuropathy, unspecified: Secondary | ICD-10-CM

## 2014-05-10 DIAGNOSIS — Z853 Personal history of malignant neoplasm of breast: Secondary | ICD-10-CM

## 2014-05-10 DIAGNOSIS — G589 Mononeuropathy, unspecified: Secondary | ICD-10-CM

## 2014-05-10 MED ORDER — GABAPENTIN 300 MG PO CAPS: 300.0000 mg | ORAL_CAPSULE | Freq: Three times a day (TID) | ORAL | Status: DC

## 2014-05-10 NOTE — Telephone Encounter (Signed)
gv and printed appt sched and avs for pt for Feb 2016 °

## 2014-05-10 NOTE — Progress Notes (Signed)
[place] OFFICE PROGRESS NOTE  Patient Care Team: Kathyrn Lass, MD as PCP - General (Family Medicine)  SUMMARY OF ONCOLOGIC HISTORY: Oncology History   Breast cancer of upper-outer quadrant of right female breast, (ER/PR positive, her2/Neu negative)   Primary site: Breast (Right)   Staging method: AJCC 7th Edition   Clinical: Stage IA (T1a, N0, cM0) signed by [name], MD on 09/07/2013 11:14 AM   Pathologic: Stage IA (T1a, N0, cM0) signed by [name], MD on 09/07/2013 11:14 AM   Summary: Stage IA (T1a, N0, cM0)  Breast cancer of upper-outer quadrant of left female breast (ER/PR/Her2Neu negative)   Primary site: Breast (Left)   Staging method: AJCC 7th Edition   Clinical: Stage IIA (T2, N0, cM0) signed by [name], MD on 09/07/2013 11:35 AM   Pathologic: Stage IIA (T2, N0, cM0) signed by [name], MD on 09/07/2013 11:35 AM   Summary: Stage IIA (T2, N0, cM0)       Breast cancer of upper-outer quadrant of left female breast   07/10/2007 Procedure US biopsy showed invasive ductal carcinoma 0.8cm, Nottingham grade 3   07/30/2007 Surgery She had left breast lumpectomy and LN biopsy which showed high grade invasive ductal cancer Nottingham grade 3, 2.7 cm, ER/PR/Her2 neu negative   11/24/2007 Surgery Patient elected for bilateral mastectomy   09/07/2008 - 03/08/2009 Chemotherapy dates are approximate: she received adriamycin and cytoxan followed by Taxol   03/08/2009 - 05/08/2009 Radiation Therapy dates are approximate, she received XRT     Breast cancer of upper-outer quadrant of right female breast   07/10/2006 Procedure stereotactic biopsy showed atypical hyperplasia   10/23/2006 Surgery right lumpectomy showed invasive ductal ca (Nottingham grade 1)  29m and DCIS, ER/PR positive Her 2 negative   10/09/2007 - 10/08/2012 Chemotherapy Patient was placed on Tamoxifen    INTERVAL HISTORY: Please see below for problem oriented charting. The patient complained of chest  wall tenderness, mild puffiness around her right axilla and chronic back pain.  REVIEW OF SYSTEMS:   Constitutional: Denies fevers, chills or abnormal weight loss Eyes: Denies blurriness of vision Ears, nose, mouth, throat, and face: Denies mucositis or sore throat Respiratory: Denies cough, dyspnea or wheezes Cardiovascular: Denies palpitation, chest discomfort or lower extremity swelling Gastrointestinal:  Denies nausea, heartburn or change in bowel habits Skin: Denies abnormal skin rashes Lymphatics: Denies new lymphadenopathy or easy bruising Neurological:Denies numbness, tingling or new weaknesses Behavioral/Psych: Mood is stable, no new changes  All other systems were reviewed with the patient and are negative.  I have reviewed the past medical history, past surgical history, social history and family history with the patient and they are unchanged from previous note.  ALLERGIES:  is allergic to codeine and vicodin.  MEDICATIONS:  Current Outpatient Prescriptions  Medication Sig Dispense Refill  . diphenhydrAMINE (BENADRYL) 25 MG tablet Take 25 mg by mouth every 6 (six) hours as needed for itching.      . losartan-hydrochlorothiazide (HYZAAR) 50-12.5 MG per tablet Take 1-1.5 tablets by mouth 2 (two) times daily. Take one tablet every morning and take half of a tablet every evening      . oxyCODONE-acetaminophen (PERCOCET/ROXICET) 5-325 MG per tablet Take 2 tablets by mouth every 4 (four) hours as needed for severe pain.  15 tablet  0  . promethazine (PHENERGAN) 25 MG tablet Take 12.5-25 mg by mouth every 6 (six) hours as needed for nausea.      . simvastatin (ZOCOR) 20 MG tablet Take 20  mg by mouth daily.      . SUMAtriptan (IMITREX) 50 MG tablet Take 50 mg by mouth every 2 (two) hours as needed for migraine.      . gabapentin (NEURONTIN) 300 MG capsule Take 1 capsule (300 mg total) by mouth 3 (three) times daily.  90 capsule  6   No current facility-administered medications for  this visit.    PHYSICAL EXAMINATION: ECOG PERFORMANCE STATUS: 0 - Asymptomatic  Filed Vitals:   05/10/14 0835  BP: 156/77  Pulse: 62  Temp: 98.1 F (36.7 C)  Resp: 19   Filed Weights   05/10/14 0835  Weight: 230 lb 9.6 oz (104.599 kg)    GENERAL:alert, no distress and comfortable SKIN: skin color, texture, turgor are normal, no rashes or significant lesions EYES: normal, Conjunctiva are pink and non-injected, sclera clear OROPHARYNX:no exudate, no erythema and lips, buccal mucosa, and tongue normal  NECK: supple, thyroid normal size, non-tender, without nodularity LYMPH:  no palpable lymphadenopathy in the cervical, axillary or inguinal LUNGS: clear to auscultation and percussion with normal breathing effort HEART: regular rate & rhythm and no murmurs and no lower extremity edema ABDOMEN:abdomen soft, non-tender and normal bowel sounds Musculoskeletal:no cyanosis of digits and no clubbing  NEURO: alert & oriented x 3 with fluent speech, no focal motor/sensory deficits Well-healed mastectomy scar bilaterally. Very mild lymphedema on the right axilla. Chest wall is tender on gentle palpation on the right side. LABORATORY DATA:  I have reviewed the data as listed    Component Value Date/Time   NA 141 01/28/2014 1859   K 4.0 01/28/2014 1859   CL 102 01/28/2014 1859   CO2 26 01/28/2014 1859   GLUCOSE 100* 01/28/2014 1859   BUN 17 01/28/2014 1859   CREATININE 1.00 01/28/2014 1859   CALCIUM 9.5 01/28/2014 1859   GFRNONAA 59* 01/28/2014 1859   GFRAA 68* 01/28/2014 1859    No results found for this basename: SPEP, UPEP,  kappa and lambda light chains    Lab Results  Component Value Date   WBC 7.9 01/28/2014   NEUTROABS 3.9 01/28/2014   HGB 13.2 01/28/2014   HCT 40.6 01/28/2014   MCV 82.4 01/28/2014   PLT 294 01/28/2014      Chemistry      Component Value Date/Time   NA 141 01/28/2014 1859   K 4.0 01/28/2014 1859   CL 102 01/28/2014 1859   CO2 26 01/28/2014 1859   BUN 17  01/28/2014 1859   CREATININE 1.00 01/28/2014 1859      Component Value Date/Time   CALCIUM 9.5 01/28/2014 1859     ASSESSMENT & PLAN:  Breast cancer of upper-outer quadrant of left female breast Clinically, she has no evidence of recurrence. She brought with her a copy of prior surgery from 02/18/2008 and the patient had prophylactic laparoscopic hysterectomy and bilateral salpingo-oophorectomy. The patient has completed all preventive strategies against risk of breast/ovary cancer. I will see her again in 6 months with history, physical examination and blood work.   Neuropathy She has significant neuropathic pain on her chest wall from prior mastectomy. Previously she tried Neurontin for a short period of time and was discontinued due to sedation and perceived lack of benefit. I recommend a trial of gabapentin again for a longer duration this time her liver. After much discussion about risk, benefits side effects, she reluctantly agreed to restart back on gabapentin.   No orders of the defined types were placed in this encounter.   All  questions were answered. The patient knows to call the clinic with any problems, questions or concerns. No barriers to learning was detected. I spent 15 minutes counseling the patient face to face. The total time spent in the appointment was 20 minutes and more than 50% was on counseling and review of test results     [facility], [place], MD 05/10/2014 1:36 PM

## 2014-05-10 NOTE — Assessment & Plan Note (Signed)
She has significant neuropathic pain on her chest wall from prior mastectomy. Previously she tried Neurontin for a short period of time and was discontinued due to sedation and perceived lack of benefit. I recommend a trial of gabapentin again for a longer duration this time her liver. After much discussion about risk, benefits side effects, she reluctantly agreed to restart back on gabapentin.

## 2014-05-10 NOTE — Assessment & Plan Note (Signed)
Clinically, she has no evidence of recurrence. She brought with her a copy of prior surgery from 02/18/2008 and the patient had prophylactic laparoscopic hysterectomy and bilateral salpingo-oophorectomy. The patient has completed all preventive strategies against risk of breast/ovary cancer. I will see her again in 6 months with history, physical examination and blood work.

## 2014-09-02 ENCOUNTER — Ambulatory Visit
Admission: RE | Admit: 2014-09-02 | Discharge: 2014-09-02 | Disposition: A | Discharge status: Home / Self Care | Payer: BC Managed Care – PPO | Source: Ambulatory Visit | Attending: Family Medicine | Admitting: Family Medicine

## 2014-09-02 ENCOUNTER — Other Ambulatory Visit: Payer: Self-pay | Admitting: Family Medicine

## 2014-09-02 DIAGNOSIS — R0781 Pleurodynia: Secondary | ICD-10-CM

## 2014-11-07 ENCOUNTER — Other Ambulatory Visit: Payer: Self-pay | Admitting: Hematology and Oncology

## 2014-11-07 DIAGNOSIS — C50412 Malignant neoplasm of upper-outer quadrant of left female breast: Secondary | ICD-10-CM

## 2014-11-08 ENCOUNTER — Encounter: Payer: Self-pay | Admitting: Hematology and Oncology

## 2014-11-08 ENCOUNTER — Ambulatory Visit (HOSPITAL_BASED_OUTPATIENT_CLINIC_OR_DEPARTMENT_OTHER): Payer: BLUE CROSS/BLUE SHIELD | Admitting: Hematology and Oncology

## 2014-11-08 ENCOUNTER — Other Ambulatory Visit (HOSPITAL_BASED_OUTPATIENT_CLINIC_OR_DEPARTMENT_OTHER): Payer: BLUE CROSS/BLUE SHIELD

## 2014-11-08 VITALS — BP 139/78 | HR 61 | Temp 97.9°F | Resp 18 | Ht 64.0 in | Wt 235.8 lb

## 2014-11-08 DIAGNOSIS — G629 Polyneuropathy, unspecified: Secondary | ICD-10-CM

## 2014-11-08 DIAGNOSIS — Z853 Personal history of malignant neoplasm of breast: Secondary | ICD-10-CM

## 2014-11-08 DIAGNOSIS — C50412 Malignant neoplasm of upper-outer quadrant of left female breast: Secondary | ICD-10-CM

## 2014-11-08 LAB — COMPREHENSIVE METABOLIC PANEL (CC13)
ALT: 12 U/L (ref 0–55)
ANION GAP: 10 meq/L (ref 3–11)
AST: 13 U/L (ref 5–34)
Albumin: 3.2 g/dL — ABNORMAL LOW (ref 3.5–5.0)
Alkaline Phosphatase: 115 U/L (ref 40–150)
BILIRUBIN TOTAL: 0.37 mg/dL (ref 0.20–1.20)
BUN: 16.2 mg/dL (ref 7.0–26.0)
CO2: 28 meq/L (ref 22–29)
CREATININE: 0.9 mg/dL (ref 0.6–1.1)
Calcium: 9 mg/dL (ref 8.4–10.4)
Chloride: 102 mEq/L (ref 98–109)
EGFR: 78 mL/min/{1.73_m2} — AB (ref >90)
GLUCOSE: 102 mg/dL (ref 70–140)
Potassium: 3.6 mEq/L (ref 3.5–5.1)
Sodium: 141 mEq/L (ref 136–145)
Total Protein: 6.5 g/dL (ref 6.4–8.3)

## 2014-11-08 LAB — CBC WITH DIFFERENTIAL/PLATELET
BASO%: 0.1 % (ref 0.0–2.0)
BASOS ABS: 0 10*3/uL (ref 0.0–0.1)
EOS ABS: 0.2 10*3/uL (ref 0.0–0.5)
EOS%: 1.8 % (ref 0.0–7.0)
HCT: 41.5 % (ref 34.8–46.6)
HEMOGLOBIN: 13.2 g/dL (ref 11.6–15.9)
LYMPH%: 37.2 % (ref 14.0–49.7)
MCH: 26.4 pg (ref 25.1–34.0)
MCHC: 31.8 g/dL (ref 31.5–36.0)
MCV: 83 fL (ref 79.5–101.0)
MONO#: 0.8 10*3/uL (ref 0.1–0.9)
MONO%: 8.3 % (ref 0.0–14.0)
NEUT%: 52.6 % (ref 38.4–76.8)
NEUTROS ABS: 5.1 10*3/uL (ref 1.5–6.5)
Platelets: 266 10*3/uL (ref 145–400)
RBC: 5 10*6/uL (ref 3.70–5.45)
RDW: 15.5 % — AB (ref 11.2–14.5)
WBC: 9.6 10*3/uL (ref 3.9–10.3)
lymph#: 3.6 10*3/uL — ABNORMAL HIGH (ref 0.9–3.3)

## 2014-11-08 NOTE — Assessment & Plan Note (Signed)
She had chronic neuropathy from prior surgery, chemotherapy and radiation. I recommend she resume gabapentin.

## 2014-11-08 NOTE — Progress Notes (Signed)
[place] OFFICE PROGRESS NOTE  Patient Care Team: Kathyrn Lass, MD as PCP - General (Family Medicine)  SUMMARY OF ONCOLOGIC HISTORY: Oncology History   Breast cancer of upper-outer quadrant of right female breast, (ER/PR positive, her2/Neu negative)   Primary site: Breast (Right)   Staging method: AJCC 7th Edition   Clinical: Stage IA (T1a, N0, cM0) signed by [name], MD on 09/07/2013 11:14 AM   Pathologic: Stage IA (T1a, N0, cM0) signed by [name], MD on 09/07/2013 11:14 AM   Summary: Stage IA (T1a, N0, cM0)  Breast cancer of upper-outer quadrant of left female breast (ER/PR/Her2Neu negative)   Primary site: Breast (Left)   Staging method: AJCC 7th Edition   Clinical: Stage IIA (T2, N0, cM0) signed by [name], MD on 09/07/2013 11:35 AM   Pathologic: Stage IIA (T2, N0, cM0) signed by [name], MD on 09/07/2013 11:35 AM   Summary: Stage IIA (T2, N0, cM0)       Breast cancer of upper-outer quadrant of left female breast   07/10/2007 Procedure US biopsy showed invasive ductal carcinoma 0.8cm, Nottingham grade 3   07/30/2007 Surgery She had left breast lumpectomy and LN biopsy which showed high grade invasive ductal cancer Nottingham grade 3, 2.7 cm, ER/PR/Her2 neu negative   11/24/2007 Surgery Patient elected for bilateral mastectomy   09/07/2008 - 03/08/2009 Chemotherapy dates are approximate: she received adriamycin and cytoxan followed by Taxol   03/08/2009 - 05/08/2009 Radiation Therapy dates are approximate, she received XRT     Breast cancer of upper-outer quadrant of right female breast   07/10/2006 Procedure stereotactic biopsy showed atypical hyperplasia   10/23/2006 Surgery right lumpectomy showed invasive ductal ca (Nottingham grade 1)  44m and DCIS, ER/PR positive Her 2 negative   10/09/2007 - 10/08/2012 Chemotherapy Patient was placed on Tamoxifen    INTERVAL HISTORY: Please see below for problem oriented charting. She returns for routine  follow-up. She continues to complain of intermittent mild chest wall swelling and neuropathic pain. She denies any recent abnormal breast examination, palpable mass, abnormal breast appearance   REVIEW OF SYSTEMS:   Constitutional: Denies fevers, chills or abnormal weight loss Eyes: Denies blurriness of vision Ears, nose, mouth, throat, and face: Denies mucositis or sore throat Respiratory: Denies cough, dyspnea or wheezes Cardiovascular: Denies palpitation, chest discomfort or lower extremity swelling Gastrointestinal:  Denies nausea, heartburn or change in bowel habits Skin: Denies abnormal skin rashes Lymphatics: Denies new lymphadenopathy or easy bruising Neurological:Denies numbness, tingling or new weaknesses Behavioral/Psych: Mood is stable, no new changes  All other systems were reviewed with the patient and are negative.  I have reviewed the past medical history, past surgical history, social history and family history with the patient and they are unchanged from previous note.  ALLERGIES:  is allergic to codeine and vicodin.  MEDICATIONS:  Current Outpatient Prescriptions  Medication Sig Dispense Refill  . Ascorbic Acid (VITAMIN C) 100 MG tablet Take 100 mg by mouth daily.    . B Complex Vitamins (B-COMPLEX/B-12) TABS Take by mouth.    . diphenhydrAMINE (BENADRYL) 25 MG tablet Take 25 mg by mouth every 6 (six) hours as needed for itching.    . losartan-hydrochlorothiazide (HYZAAR) 50-12.5 MG per tablet Take 1-1.5 tablets by mouth 2 (two) times daily. Take one tablet every morning and take half of a tablet every evening    . oxyCODONE-acetaminophen (PERCOCET/ROXICET) 5-325 MG per tablet Take 2 tablets by mouth every 4 (four) hours as needed for severe  pain. 15 tablet 0  . promethazine (PHENERGAN) 25 MG tablet Take 12.5-25 mg by mouth every 6 (six) hours as needed for nausea.    . simvastatin (ZOCOR) 20 MG tablet Take 20 mg by mouth daily.    . SUMAtriptan (IMITREX) 50 MG  tablet Take 50 mg by mouth every 2 (two) hours as needed for migraine.    . gabapentin (NEURONTIN) 300 MG capsule Take 1 capsule (300 mg total) by mouth 3 (three) times daily. (Patient not taking: Reported on 11/08/2014) 90 capsule 6   No current facility-administered medications for this visit.    PHYSICAL EXAMINATION: ECOG PERFORMANCE STATUS: 1 - Symptomatic but completely ambulatory  Filed Vitals:   11/08/14 0830  BP: 139/78  Pulse: 61  Temp: 97.9 F (36.6 C)  Resp: 18   Filed Weights   11/08/14 0830  Weight: 235 lb 12.8 oz (106.958 kg)    GENERAL:alert, no distress and comfortable. She is morbidly obese SKIN: skin color, texture, turgor are normal, no rashes or significant lesions EYES: normal, Conjunctiva are pink and non-injected, sclera clear OROPHARYNX:no exudate, no erythema and lips, buccal mucosa, and tongue normal  NECK: supple, thyroid normal size, non-tender, without nodularity LYMPH:  no palpable lymphadenopathy in the cervical, axillary or inguinal LUNGS: clear to auscultation and percussion with normal breathing effort HEART: regular rate & rhythm and no murmurs and no lower extremity edema ABDOMEN:abdomen soft, non-tender and normal bowel sounds Musculoskeletal:no cyanosis of digits and no clubbing  NEURO: alert & oriented x 3 with fluent speech, no focal motor/sensory deficits Bilateral chest wall examination revealed well-healed mastectomy scars. LABORATORY DATA:  I have reviewed the data as listed    Component Value Date/Time   NA 141 11/08/2014 0815   NA 141 01/28/2014 1859   K 3.6 11/08/2014 0815   K 4.0 01/28/2014 1859   CL 102 01/28/2014 1859   CO2 28 11/08/2014 0815   CO2 26 01/28/2014 1859   GLUCOSE 102 11/08/2014 0815   GLUCOSE 100* 01/28/2014 1859   BUN 16.2 11/08/2014 0815   BUN 17 01/28/2014 1859   CREATININE 0.9 11/08/2014 0815   CREATININE 1.00 01/28/2014 1859   CALCIUM 9.0 11/08/2014 0815   CALCIUM 9.5 01/28/2014 1859   PROT 6.5  11/08/2014 0815   ALBUMIN 3.2* 11/08/2014 0815   AST 13 11/08/2014 0815   ALT 12 11/08/2014 0815   ALKPHOS 115 11/08/2014 0815   BILITOT 0.37 11/08/2014 0815   GFRNONAA 59* 01/28/2014 1859   GFRAA 68* 01/28/2014 1859    No results found for: SPEP, UPEP  Lab Results  Component Value Date   WBC 9.6 11/08/2014   NEUTROABS 5.1 11/08/2014   HGB 13.2 11/08/2014   HCT 41.5 11/08/2014   MCV 83.0 11/08/2014   PLT 266 11/08/2014      Chemistry      Component Value Date/Time   NA 141 11/08/2014 0815   NA 141 01/28/2014 1859   K 3.6 11/08/2014 0815   K 4.0 01/28/2014 1859   CL 102 01/28/2014 1859   CO2 28 11/08/2014 0815   CO2 26 01/28/2014 1859   BUN 16.2 11/08/2014 0815   BUN 17 01/28/2014 1859   CREATININE 0.9 11/08/2014 0815   CREATININE 1.00 01/28/2014 1859      Component Value Date/Time   CALCIUM 9.0 11/08/2014 0815   CALCIUM 9.5 01/28/2014 1859   ALKPHOS 115 11/08/2014 0815   AST 13 11/08/2014 0815   ALT 12 11/08/2014 0815   BILITOT 0.37 11/08/2014  0815     ASSESSMENT & PLAN:  Breast cancer of upper-outer quadrant of left female breast Clinically, she has no signs of recurrence. I recommend discharge the patient from the clinic as she has completed all preventative care strategies.   Neuropathy She had chronic neuropathy from prior surgery, chemotherapy and radiation. I recommend she resume gabapentin.    No orders of the defined types were placed in this encounter.   All questions were answered. The patient knows to call the clinic with any problems, questions or concerns. No barriers to learning was detected. I spent 15 minutes counseling the patient face to face. The total time spent in the appointment was 20 minutes and more than 50% was on counseling and review of test results     [facility], [place], MD 11/08/2014 9:14 AM

## 2014-11-08 NOTE — Assessment & Plan Note (Signed)
Clinically, she has no signs of recurrence. I recommend discharge the patient from the clinic as she has completed all preventative care strategies.

## 2015-02-09 ENCOUNTER — Other Ambulatory Visit: Payer: Self-pay | Admitting: Orthopedic Surgery

## 2015-02-15 ENCOUNTER — Ambulatory Visit (HOSPITAL_COMMUNITY)
Admission: RE | Admit: 2015-02-15 | Discharge: 2015-02-15 | Disposition: A | Discharge status: Home / Self Care | Payer: BLUE CROSS/BLUE SHIELD | Source: Ambulatory Visit | Attending: Orthopedic Surgery | Admitting: Orthopedic Surgery

## 2015-02-15 ENCOUNTER — Encounter (HOSPITAL_COMMUNITY)
Admission: RE | Admit: 2015-02-15 | Discharge: 2015-02-15 | Disposition: A | Discharge status: Home / Self Care | Payer: BLUE CROSS/BLUE SHIELD | Source: Ambulatory Visit | Attending: Orthopedic Surgery | Admitting: Orthopedic Surgery

## 2015-02-15 ENCOUNTER — Encounter (HOSPITAL_COMMUNITY): Payer: Self-pay

## 2015-02-15 DIAGNOSIS — Z01818 Encounter for other preprocedural examination: Secondary | ICD-10-CM

## 2015-02-15 HISTORY — DX: Depression, unspecified: F32.A

## 2015-02-15 HISTORY — DX: Headache, unspecified: R51.9

## 2015-02-15 HISTORY — DX: Major depressive disorder, single episode, unspecified: F32.9

## 2015-02-15 HISTORY — DX: Headache: R51

## 2015-02-15 LAB — CBC WITH DIFFERENTIAL/PLATELET
BASOS PCT: 1 % (ref 0–1)
Basophils Absolute: 0 10*3/uL (ref 0.0–0.1)
Eosinophils Absolute: 0.1 10*3/uL (ref 0.0–0.7)
Eosinophils Relative: 2 % (ref 0–5)
HCT: 41.7 % (ref 36.0–46.0)
Hemoglobin: 13.3 g/dL (ref 12.0–15.0)
Lymphocytes Relative: 40 % (ref 12–46)
Lymphs Abs: 2.6 10*3/uL (ref 0.7–4.0)
MCH: 25.7 pg — ABNORMAL LOW (ref 26.0–34.0)
MCHC: 31.9 g/dL (ref 30.0–36.0)
MCV: 80.7 fL (ref 78.0–100.0)
Monocytes Absolute: 0.4 10*3/uL (ref 0.1–1.0)
Monocytes Relative: 6 % (ref 3–12)
Neutro Abs: 3.3 10*3/uL (ref 1.7–7.7)
Neutrophils Relative %: 51 % (ref 43–77)
Platelets: 277 10*3/uL (ref 150–400)
RBC: 5.17 MIL/uL — AB (ref 3.87–5.11)
RDW: 14.5 % (ref 11.5–15.5)
WBC: 6.4 10*3/uL (ref 4.0–10.5)

## 2015-02-15 LAB — SURGICAL PCR SCREEN
MRSA, PCR: NEGATIVE
Staphylococcus aureus: NEGATIVE

## 2015-02-15 LAB — PROTIME-INR
INR: 1.07 (ref 0.00–1.49)
Prothrombin Time: 14.1 seconds (ref 11.6–15.2)

## 2015-02-15 LAB — BASIC METABOLIC PANEL
ANION GAP: 10 (ref 5–15)
BUN: 8 mg/dL (ref 6–20)
CO2: 26 mmol/L (ref 22–32)
Calcium: 9.1 mg/dL (ref 8.9–10.3)
Chloride: 106 mmol/L (ref 101–111)
Creatinine, Ser: 0.92 mg/dL (ref 0.44–1.00)
GFR calc Af Amer: >60 mL/min (ref >60)
Glucose, Bld: 104 mg/dL — ABNORMAL HIGH (ref 65–99)
POTASSIUM: 3.1 mmol/L — AB (ref 3.5–5.1)
Sodium: 142 mmol/L (ref 135–145)

## 2015-02-15 LAB — TYPE AND SCREEN
ABO/RH(D): O POS
Antibody Screen: NEGATIVE

## 2015-02-15 LAB — APTT: aPTT: 32 seconds (ref 24–37)

## 2015-02-15 NOTE — Progress Notes (Signed)
Denies any heart issues. PCP Dr. Kathyrn Lass Oncologist Dr Alvy Bimler.

## 2015-02-15 NOTE — Pre-Procedure Instructions (Signed)
    Cynthia Velasquez  02/15/2015      [id number] - [place], [place] [address] [place] [address] Phone: [phone] Fax: [phone]    Your procedure is scheduled on Monday, June 13th   Report to [facility] Admitting at 8:00 A.M.  Call this number if you have problems the morning of surgery:  [phone]   Remember:   Do not eat food or drink liquids after midnight Sunday.  Take these medicines the morning of surgery with A SIP OF WATER : Zoloft, Tramadol   Do not wear jewelry, make-up or nail polish.  Do not wear lotions, powders, or perfumes.  You may NOT wear deodorant the day of surgery.  Do not shave underarms & legs 48 hours prior to surgery.    Do not bring valuables to the hospital.   [facility] is not responsible for any belongings or valuables.  Contacts, dentures or bridgework may not be worn into surgery.  Leave your suitcase in the car.  After surgery it may be brought to your room.  For patients admitted to the hospital, discharge time will be determined by your treatment team.  Name and phone number of your driver:     Special instructions:  "Preparing for Surgery" instruction sheet.  Please read over the following fact sheets that you were given. Pain Booklet, Coughing and Deep Breathing, Blood Transfusion Information, MRSA Information and Surgical Site Infection Prevention

## 2015-02-24 DIAGNOSIS — M1711 Unilateral primary osteoarthritis, right knee: Secondary | ICD-10-CM | POA: Diagnosis present

## 2015-02-24 MED ORDER — BUPIVACAINE LIPOSOME 1.3 % IJ SUSP
20.0000 mL | Freq: Once | INTRAMUSCULAR | Status: AC
  Administered 2015-02-27: 20 mL
  Filled 2015-02-24: qty 20

## 2015-02-24 NOTE — H&P (Signed)
TOTAL KNEE ADMISSION H&P  Patient is being admitted for right total knee arthroplasty.  Subjective:  Chief Complaint:right knee pain.  HPI: Cynthia Velasquez, 63 y.o. female, has a history of pain and functional disability in the right knee due to arthritis and has failed non-surgical conservative treatments for greater than 12 weeks to includeNSAID's and/or analgesics, corticosteriod injections, viscosupplementation injections, use of assistive devices, weight reduction as appropriate and activity modification.  Onset of symptoms was gradual, starting 2 years ago with gradually worsening course since that time. The patient noted no past surgery on the right knee(s).  Patient currently rates pain in the right knee(s) at 10 out of 10 with activity. Patient has night pain, worsening of pain with activity and weight bearing, pain that interferes with activities of daily living, pain with passive range of motion, crepitus and joint swelling.  Patient has evidence of subchondral sclerosis and joint space narrowing by imaging studies.  There is no active infection.  Patient Active Problem List   Diagnosis Date Noted  . Neuropathy 09/07/2013  . Breast cancer of upper-outer quadrant of left female breast 08/09/2013  . Breast cancer of upper-outer quadrant of right female breast 08/09/2013  . Osteoarthritis of right hip 05/03/2013  . Osteoarthritis of left hip 02/15/2013   Past Medical History  Diagnosis Date  . Cancer   . Breast cancer   . Lumbar stenosis   . Hyperlipidemia     takes Simvastatin daily  . Hypertension     takes Hyzaar  . History of migraine   . Arthritis   . Joint pain   . Joint swelling   . GERD (gastroesophageal reflux disease)     doesn't take any meds for this  . History of bladder infections   . Gallstones   . Neuropathy 09/07/2013  . Bilateral breast cancer 10/21/2013  . Headache     h/o migraines      . Depression   . Diabetes mellitus without complication     pre  diabetic but takes no meds--diet controlled    Past Surgical History  Procedure Laterality Date  . Double mastectomy     . Tonsillectomy    . Abdominal hysterectomy    . Hernia repair      umbilical  . Surgery for hiatal hernia    . Cholecystectomy    . Bone spur removed from left foot    . Right knee arthroscopy    . Right shoulder arthroscopy    . Colonoscopy    . Total hip arthroplasty Left 02/12/2013  . Total hip arthroplasty Left 02/12/2013    Procedure: LEFT TOTAL HIP ARTHROPLASTY;  Surgeon: Kerin Salen, MD;  Location: [facility];  Service: Orthopedics;  Laterality: Left;  . Total hip arthroplasty Right 05/03/2013    Procedure: TOTAL HIP ARTHROPLASTY;  Surgeon: Kerin Salen, MD;  Location: [facility];  Service: Orthopedics;  Laterality: Right;    No prescriptions prior to admission   Allergies  Allergen Reactions  . Oxycodone-Acetaminophen Hives and Itching  . Codeine Itching and Rash  . Vicodin [Hydrocodone-Acetaminophen] Rash    History  Substance Use Topics  . Smoking status: Never Smoker   . Smokeless tobacco: Never Used  . Alcohol Use: No     Comment: rare    Family History  Problem Relation Age of Onset  . Adopted: Yes     Review of Systems  Constitutional: Negative.   HENT: Negative.   Eyes: Negative.   Cardiovascular: Positive for  leg swelling.       HTN  Gastrointestinal: Positive for abdominal pain and constipation.       Ulcers  Genitourinary:       Poor bladder control  Musculoskeletal: Positive for joint pain.  Skin: Negative.   Neurological: Negative.   Endo/Heme/Allergies: Negative.   Psychiatric/Behavioral: The patient is nervous/anxious.     Objective:  Physical Exam  Constitutional: She is oriented to person, place, and time. She appears well-developed and well-nourished.  HENT:  Head: Normocephalic and atraumatic.  Eyes: Pupils are equal, round, and reactive to light.  Neck: Normal range of motion. Neck supple.  Cardiovascular: Intact  distal pulses.   Respiratory: Effort normal.  Musculoskeletal: She exhibits tenderness.  she has a range from 5 to 110.  No instability.  Obvious crepitus with range of motion.  She has diffuse tenderness.  No noticeable effusion.  Her calves are soft and nontender.  She is neurovascularly intact distally.  Neurological: She is alert and oriented to person, place, and time.  Skin: Skin is warm and dry.  Psychiatric: She has a normal mood and affect. Her behavior is normal. Judgment and thought content normal.    Vital signs in last 24 hours:    Labs:   Estimated body mass index is 40.45 kg/(m^2) as calculated from the following:   Height as of 11/08/14: 5\' 4"  (1.626 m).   Weight as of 11/08/14: 106.958 kg (235 lb 12.8 oz).   Imaging Review Plain radiographs demonstrate bilateral AP weightbearing, bilateral Rosenberg, lateral sunrise views of the left knee are taken and reviewed in office today.  Patient's right knee does have end-stage arthritis of the lateral compartment.  Patient's left knee has moderate to severe arthritis of the medial compartment.  Assessment/Plan:  End stage arthritis, right knee   The patient history, physical examination, clinical judgment of the provider and imaging studies are consistent with end stage degenerative joint disease of the right knee(s) and total knee arthroplasty is deemed medically necessary. The treatment options including medical management, injection therapy arthroscopy and arthroplasty were discussed at length. The risks and benefits of total knee arthroplasty were presented and reviewed. The risks due to aseptic loosening, infection, stiffness, patella tracking problems, thromboembolic complications and other imponderables were discussed. The patient acknowledged the explanation, agreed to proceed with the plan and consent was signed. Patient is being admitted for inpatient treatment for surgery, pain control, PT, OT, prophylactic antibiotics,  VTE prophylaxis, progressive ambulation and ADL's and discharge planning. The patient is planning to be discharged to skilled nursing facility

## 2015-02-24 NOTE — Progress Notes (Signed)
Spoke with pt and informed her of surgical time change with new arrival time of 0700.  Pt states understanding.

## 2015-02-27 ENCOUNTER — Encounter (HOSPITAL_COMMUNITY)
Admission: RE | Disposition: A | Discharge status: Skilled Nursing Facility | Payer: Self-pay | Source: Ambulatory Visit | Attending: Orthopedic Surgery

## 2015-02-27 ENCOUNTER — Encounter (HOSPITAL_COMMUNITY): Payer: Self-pay | Admitting: *Deleted

## 2015-02-27 ENCOUNTER — Inpatient Hospital Stay (HOSPITAL_COMMUNITY): Payer: BLUE CROSS/BLUE SHIELD | Admitting: Anesthesiology

## 2015-02-27 ENCOUNTER — Inpatient Hospital Stay (HOSPITAL_COMMUNITY)
Admission: RE | Admit: 2015-02-27 | Discharge: 2015-03-01 | DRG: 470 | Disposition: A | Discharge status: Skilled Nursing Facility | Payer: BLUE CROSS/BLUE SHIELD | Source: Ambulatory Visit | Attending: Orthopedic Surgery | Admitting: Orthopedic Surgery

## 2015-02-27 DIAGNOSIS — M1711 Unilateral primary osteoarthritis, right knee: Secondary | ICD-10-CM | POA: Diagnosis present

## 2015-02-27 DIAGNOSIS — E785 Hyperlipidemia, unspecified: Secondary | ICD-10-CM | POA: Diagnosis present

## 2015-02-27 DIAGNOSIS — Z853 Personal history of malignant neoplasm of breast: Secondary | ICD-10-CM

## 2015-02-27 DIAGNOSIS — I10 Essential (primary) hypertension: Secondary | ICD-10-CM | POA: Diagnosis present

## 2015-02-27 DIAGNOSIS — K219 Gastro-esophageal reflux disease without esophagitis: Secondary | ICD-10-CM | POA: Diagnosis present

## 2015-02-27 DIAGNOSIS — Z7982 Long term (current) use of aspirin: Secondary | ICD-10-CM

## 2015-02-27 DIAGNOSIS — Z96643 Presence of artificial hip joint, bilateral: Secondary | ICD-10-CM | POA: Diagnosis present

## 2015-02-27 DIAGNOSIS — G629 Polyneuropathy, unspecified: Secondary | ICD-10-CM | POA: Diagnosis present

## 2015-02-27 DIAGNOSIS — M1710 Unilateral primary osteoarthritis, unspecified knee: Secondary | ICD-10-CM | POA: Diagnosis present

## 2015-02-27 DIAGNOSIS — Z6841 Body Mass Index (BMI) 40.0 and over, adult: Secondary | ICD-10-CM | POA: Diagnosis not present

## 2015-02-27 DIAGNOSIS — D62 Acute posthemorrhagic anemia: Secondary | ICD-10-CM | POA: Diagnosis not present

## 2015-02-27 DIAGNOSIS — M4806 Spinal stenosis, lumbar region: Secondary | ICD-10-CM | POA: Diagnosis present

## 2015-02-27 DIAGNOSIS — E119 Type 2 diabetes mellitus without complications: Secondary | ICD-10-CM | POA: Diagnosis present

## 2015-02-27 DIAGNOSIS — M25561 Pain in right knee: Secondary | ICD-10-CM | POA: Diagnosis present

## 2015-02-27 HISTORY — PX: TOTAL KNEE ARTHROPLASTY: SHX125

## 2015-02-27 LAB — URINALYSIS, ROUTINE W REFLEX MICROSCOPIC
Bilirubin Urine: NEGATIVE
Glucose, UA: NEGATIVE mg/dL
KETONES UR: NEGATIVE mg/dL
Nitrite: POSITIVE — AB
PH: 5 (ref 5.0–8.0)
PROTEIN: NEGATIVE mg/dL
Specific Gravity, Urine: 1.024 (ref 1.005–1.030)
Urobilinogen, UA: 0.2 mg/dL (ref 0.0–1.0)

## 2015-02-27 LAB — URINE MICROSCOPIC-ADD ON

## 2015-02-27 LAB — GLUCOSE, CAPILLARY: GLUCOSE-CAPILLARY: 98 mg/dL (ref 65–99)

## 2015-02-27 SURGERY — ARTHROPLASTY, KNEE, TOTAL
Anesthesia: General | Site: Knee | Laterality: Right

## 2015-02-27 MED ORDER — FENTANYL CITRATE (PF) 250 MCG/5ML IJ SOLN
INTRAMUSCULAR | Status: AC
  Filled 2015-02-27: qty 5

## 2015-02-27 MED ORDER — CEFUROXIME SODIUM 1.5 G IJ SOLR
INTRAMUSCULAR | Status: AC
  Filled 2015-02-27: qty 1.5

## 2015-02-27 MED ORDER — PROPOFOL 10 MG/ML IV BOLUS
INTRAVENOUS | Status: AC
  Filled 2015-02-27: qty 20

## 2015-02-27 MED ORDER — ONDANSETRON HCL 4 MG/2ML IJ SOLN: 4.0000 mg | Freq: Four times a day (QID) | INTRAMUSCULAR | Status: DC | PRN

## 2015-02-27 MED ORDER — ASPIRIN EC 325 MG PO TBEC
325.0000 mg | DELAYED_RELEASE_TABLET | Freq: Every day | ORAL | Status: DC
  Administered 2015-03-01: 325 mg via ORAL
  Filled 2015-02-27 (×2): qty 1

## 2015-02-27 MED ORDER — LOSARTAN POTASSIUM 25 MG PO TABS
25.0000 mg | ORAL_TABLET | Freq: Every day | ORAL | Status: DC
  Administered 2015-02-27 – 2015-02-28 (×2): 25 mg via ORAL
  Filled 2015-02-27 (×2): qty 1

## 2015-02-27 MED ORDER — KCL IN DEXTROSE-NACL 20-5-0.45 MEQ/L-%-% IV SOLN
INTRAVENOUS | Status: DC
  Administered 2015-02-28: 02:00:00 via INTRAVENOUS
  Filled 2015-02-27: qty 1000

## 2015-02-27 MED ORDER — LACTATED RINGERS IV SOLN
INTRAVENOUS | Status: DC | PRN
  Administered 2015-02-27 (×2): via INTRAVENOUS

## 2015-02-27 MED ORDER — HYDROMORPHONE HCL 1 MG/ML IJ SOLN
0.2500 mg | INTRAMUSCULAR | Status: DC | PRN
  Administered 2015-02-27 (×3): 0.5 mg via INTRAVENOUS
  Administered 2015-02-27 (×2): 0.25 mg via INTRAVENOUS

## 2015-02-27 MED ORDER — HYDROCHLOROTHIAZIDE 12.5 MG PO CAPS
12.5000 mg | ORAL_CAPSULE | Freq: Every day | ORAL | Status: DC
  Administered 2015-02-28 – 2015-03-01 (×2): 12.5 mg via ORAL
  Filled 2015-02-27 (×3): qty 1

## 2015-02-27 MED ORDER — PROMETHAZINE HCL 25 MG/ML IJ SOLN
6.2500 mg | INTRAMUSCULAR | Status: DC | PRN
Start: 2015-02-27 — End: 2015-02-27

## 2015-02-27 MED ORDER — ONDANSETRON HCL 4 MG/2ML IJ SOLN
INTRAMUSCULAR | Status: DC | PRN
  Administered 2015-02-27: 4 mg via INTRAVENOUS

## 2015-02-27 MED ORDER — CHLORHEXIDINE GLUCONATE 4 % EX LIQD: 60.0000 mL | Freq: Once | CUTANEOUS | Status: DC

## 2015-02-27 MED ORDER — MIDAZOLAM HCL 2 MG/2ML IJ SOLN
INTRAMUSCULAR | Status: AC
  Filled 2015-02-27: qty 2

## 2015-02-27 MED ORDER — ACETAMINOPHEN 10 MG/ML IV SOLN
INTRAVENOUS | Status: AC
  Filled 2015-02-27: qty 100

## 2015-02-27 MED ORDER — KETOROLAC TROMETHAMINE 30 MG/ML IJ SOLN
INTRAMUSCULAR | Status: AC
  Filled 2015-02-27: qty 1

## 2015-02-27 MED ORDER — CEFUROXIME SODIUM 1.5 G IJ SOLR
INTRAMUSCULAR | Status: DC | PRN
  Administered 2015-02-27: 1.5 g

## 2015-02-27 MED ORDER — METHOCARBAMOL 500 MG PO TABS: 500.0000 mg | ORAL_TABLET | Freq: Four times a day (QID) | ORAL | Status: DC

## 2015-02-27 MED ORDER — SENNOSIDES-DOCUSATE SODIUM 8.6-50 MG PO TABS: 1.0000 | ORAL_TABLET | Freq: Every evening | ORAL | Status: DC | PRN

## 2015-02-27 MED ORDER — METOCLOPRAMIDE HCL 5 MG/ML IJ SOLN
5.0000 mg | Freq: Three times a day (TID) | INTRAMUSCULAR | Status: DC | PRN
Start: 2015-02-27 — End: 2015-03-01

## 2015-02-27 MED ORDER — ACETAMINOPHEN 650 MG RE SUPP: 650.0000 mg | Freq: Four times a day (QID) | RECTAL | Status: DC | PRN

## 2015-02-27 MED ORDER — 0.9 % SODIUM CHLORIDE (POUR BTL) OPTIME
TOPICAL | Status: DC | PRN
  Administered 2015-02-27: 1000 mL

## 2015-02-27 MED ORDER — SERTRALINE HCL 50 MG PO TABS
50.0000 mg | ORAL_TABLET | Freq: Every day | ORAL | Status: DC
  Administered 2015-02-28 – 2015-03-01 (×2): 50 mg via ORAL
  Filled 2015-02-27 (×2): qty 1

## 2015-02-27 MED ORDER — KETOROLAC TROMETHAMINE 30 MG/ML IJ SOLN
30.0000 mg | Freq: Once | INTRAMUSCULAR | Status: AC
  Administered 2015-02-27: 30 mg via INTRAVENOUS

## 2015-02-27 MED ORDER — HYDROMORPHONE HCL 1 MG/ML IJ SOLN: 0.5000 mg | INTRAMUSCULAR | Status: DC | PRN

## 2015-02-27 MED ORDER — EPHEDRINE SULFATE 50 MG/ML IJ SOLN
INTRAMUSCULAR | Status: DC | PRN
  Administered 2015-02-27: 10 mg via INTRAVENOUS

## 2015-02-27 MED ORDER — ACETAMINOPHEN 10 MG/ML IV SOLN
1000.0000 mg | Freq: Once | INTRAVENOUS | Status: AC
  Administered 2015-02-27: 1000 mg via INTRAVENOUS

## 2015-02-27 MED ORDER — DEXTROSE-NACL 5-0.45 % IV SOLN: INTRAVENOUS | Status: DC

## 2015-02-27 MED ORDER — TRANEXAMIC ACID (OHS) BOLUS VIA INFUSION
INTRAVENOUS | Status: DC | PRN
  Administered 2015-02-27: 1000 mg via INTRAVENOUS

## 2015-02-27 MED ORDER — DIPHENHYDRAMINE HCL 25 MG PO CAPS: 12.5000 mg | ORAL_CAPSULE | Freq: Once | ORAL | Status: DC

## 2015-02-27 MED ORDER — DIPHENHYDRAMINE HCL 50 MG/ML IJ SOLN
INTRAMUSCULAR | Status: AC
  Administered 2015-02-27: 25 mg
  Filled 2015-02-27: qty 1

## 2015-02-27 MED ORDER — SUMATRIPTAN SUCCINATE 50 MG PO TABS
50.0000 mg | ORAL_TABLET | ORAL | Status: DC | PRN
  Filled 2015-02-27: qty 1

## 2015-02-27 MED ORDER — FLEET ENEMA 7-19 GM/118ML RE ENEM
1.0000 | ENEMA | Freq: Once | RECTAL | Status: AC | PRN
Start: 2015-02-27 — End: 2015-02-27

## 2015-02-27 MED ORDER — FENTANYL CITRATE (PF) 100 MCG/2ML IJ SOLN
INTRAMUSCULAR | Status: AC
  Filled 2015-02-27: qty 2

## 2015-02-27 MED ORDER — MIDAZOLAM HCL 5 MG/5ML IJ SOLN
INTRAMUSCULAR | Status: DC | PRN
  Administered 2015-02-27 (×2): 1 mg via INTRAVENOUS

## 2015-02-27 MED ORDER — ONDANSETRON HCL 4 MG PO TABS: 4.0000 mg | ORAL_TABLET | Freq: Four times a day (QID) | ORAL | Status: DC | PRN

## 2015-02-27 MED ORDER — HYDROCHLOROTHIAZIDE 10 MG/ML ORAL SUSPENSION
6.2500 mg | Freq: Every day | ORAL | Status: DC
  Administered 2015-02-27 – 2015-03-01 (×2): 6.25 mg via ORAL
  Filled 2015-02-27 (×5): qty 1.25

## 2015-02-27 MED ORDER — SUCCINYLCHOLINE CHLORIDE 20 MG/ML IJ SOLN
INTRAMUSCULAR | Status: DC | PRN
  Administered 2015-02-27: 100 mg via INTRAVENOUS

## 2015-02-27 MED ORDER — LOSARTAN POTASSIUM 50 MG PO TABS
50.0000 mg | ORAL_TABLET | Freq: Every day | ORAL | Status: DC
  Administered 2015-02-28 – 2015-03-01 (×2): 50 mg via ORAL
  Filled 2015-02-27 (×3): qty 1

## 2015-02-27 MED ORDER — PROPOFOL 10 MG/ML IV BOLUS
INTRAVENOUS | Status: DC | PRN
  Administered 2015-02-27: 100 mg via INTRAVENOUS
  Administered 2015-02-27: 80 mg via INTRAVENOUS

## 2015-02-27 MED ORDER — BISACODYL 5 MG PO TBEC: 5.0000 mg | DELAYED_RELEASE_TABLET | Freq: Every day | ORAL | Status: DC | PRN

## 2015-02-27 MED ORDER — ASPIRIN EC 325 MG PO TBEC: 325.0000 mg | DELAYED_RELEASE_TABLET | Freq: Two times a day (BID) | ORAL | Status: DC

## 2015-02-27 MED ORDER — SODIUM CHLORIDE 0.9 % IR SOLN
Status: DC | PRN
  Administered 2015-02-27: 3000 mL

## 2015-02-27 MED ORDER — FENTANYL CITRATE (PF) 100 MCG/2ML IJ SOLN
INTRAMUSCULAR | Status: DC | PRN
  Administered 2015-02-27 (×3): 50 ug via INTRAVENOUS

## 2015-02-27 MED ORDER — DIPHENHYDRAMINE HCL 25 MG PO CAPS: 25.0000 mg | ORAL_CAPSULE | Freq: Once | ORAL | Status: DC

## 2015-02-27 MED ORDER — MENTHOL 3 MG MT LOZG: 1.0000 | LOZENGE | OROMUCOSAL | Status: DC | PRN

## 2015-02-27 MED ORDER — HYDROMORPHONE HCL 1 MG/ML IJ SOLN
INTRAMUSCULAR | Status: AC
  Filled 2015-02-27: qty 1

## 2015-02-27 MED ORDER — METHOCARBAMOL 500 MG PO TABS
500.0000 mg | ORAL_TABLET | Freq: Four times a day (QID) | ORAL | Status: DC | PRN
  Administered 2015-02-27 – 2015-03-01 (×5): 500 mg via ORAL
  Filled 2015-02-27 (×6): qty 1

## 2015-02-27 MED ORDER — PROMETHAZINE HCL 25 MG PO TABS: 12.5000 mg | ORAL_TABLET | Freq: Four times a day (QID) | ORAL | Status: DC | PRN

## 2015-02-27 MED ORDER — HYDROMORPHONE HCL 1 MG/ML IJ SOLN
INTRAMUSCULAR | Status: AC
  Filled 2015-02-27: qty 2

## 2015-02-27 MED ORDER — ALUM & MAG HYDROXIDE-SIMETH 200-200-20 MG/5ML PO SUSP
30.0000 mL | ORAL | Status: DC | PRN
Start: 2015-02-27 — End: 2015-03-01

## 2015-02-27 MED ORDER — HYDROMORPHONE HCL 4 MG PO TABS: 2.0000 mg | ORAL_TABLET | ORAL | Status: DC | PRN

## 2015-02-27 MED ORDER — CEFAZOLIN SODIUM-DEXTROSE 2-3 GM-% IV SOLR
2.0000 g | INTRAVENOUS | Status: AC
  Administered 2015-02-27: 2 g via INTRAVENOUS
  Filled 2015-02-27: qty 50

## 2015-02-27 MED ORDER — HYDROMORPHONE HCL 2 MG PO TABS
2.0000 mg | ORAL_TABLET | ORAL | Status: DC | PRN
  Administered 2015-02-27 – 2015-03-01 (×7): 2 mg via ORAL
  Filled 2015-02-27 (×7): qty 1

## 2015-02-27 MED ORDER — TRANEXAMIC ACID 1000 MG/10ML IV SOLN
1000.0000 mg | INTRAVENOUS | Status: DC
  Filled 2015-02-27: qty 10

## 2015-02-27 MED ORDER — METOCLOPRAMIDE HCL 5 MG PO TABS: 5.0000 mg | ORAL_TABLET | Freq: Three times a day (TID) | ORAL | Status: DC | PRN

## 2015-02-27 MED ORDER — SODIUM CHLORIDE 0.9 % IJ SOLN
INTRAMUSCULAR | Status: DC | PRN
  Administered 2015-02-27 (×4): 10 mL via INTRAVENOUS

## 2015-02-27 MED ORDER — ACETAMINOPHEN 325 MG PO TABS: 650.0000 mg | ORAL_TABLET | Freq: Four times a day (QID) | ORAL | Status: DC | PRN

## 2015-02-27 MED ORDER — DIPHENHYDRAMINE HCL 12.5 MG/5ML PO ELIX: 12.5000 mg | ORAL_SOLUTION | ORAL | Status: DC | PRN

## 2015-02-27 MED ORDER — LOSARTAN POTASSIUM-HCTZ 50-12.5 MG PO TABS
0.5000 | ORAL_TABLET | Freq: Two times a day (BID) | ORAL | Status: DC
Start: 2015-02-27 — End: 2015-02-27

## 2015-02-27 MED ORDER — PROPOFOL INFUSION 10 MG/ML OPTIME
INTRAVENOUS | Status: DC | PRN
  Administered 2015-02-27: 50 ug/kg/min via INTRAVENOUS

## 2015-02-27 MED ORDER — DOCUSATE SODIUM 100 MG PO CAPS
100.0000 mg | ORAL_CAPSULE | Freq: Two times a day (BID) | ORAL | Status: DC
  Administered 2015-02-27 – 2015-03-01 (×5): 100 mg via ORAL
  Filled 2015-02-27 (×5): qty 1

## 2015-02-27 MED ORDER — METHOCARBAMOL 1000 MG/10ML IJ SOLN
500.0000 mg | Freq: Four times a day (QID) | INTRAVENOUS | Status: DC | PRN
  Administered 2015-02-27: 500 mg via INTRAVENOUS
  Filled 2015-02-27 (×3): qty 5

## 2015-02-27 MED ORDER — PHENOL 1.4 % MT LIQD: 1.0000 | OROMUCOSAL | Status: DC | PRN

## 2015-02-27 MED ORDER — HYDROMORPHONE HCL 1 MG/ML IJ SOLN
0.5000 mg | INTRAMUSCULAR | Status: DC | PRN
  Administered 2015-02-27 – 2015-02-28 (×3): 1 mg via INTRAVENOUS
  Administered 2015-02-28: 0.5 mg via INTRAVENOUS
  Administered 2015-02-28: 1 mg via INTRAVENOUS
  Filled 2015-02-27 (×5): qty 1

## 2015-02-27 SURGICAL SUPPLY — 67 items
BANDAGE ESMARK 6X9 LF (GAUZE/BANDAGES/DRESSINGS) ×1 IMPLANT
BLADE SAG 18X100X1.27 (BLADE) ×2 IMPLANT
BLADE SAW SGTL 13X75X1.27 (BLADE) ×2 IMPLANT
BNDG ELASTIC 6X10 VLCR STRL LF (GAUZE/BANDAGES/DRESSINGS) ×2 IMPLANT
BNDG ESMARK 6X9 LF (GAUZE/BANDAGES/DRESSINGS) ×2
BOWL SMART MIX CTS (DISPOSABLE) ×2 IMPLANT
CAP KNEE TOTAL 3 SIGMA ×2 IMPLANT
CEMENT HV SMART SET (Cement) ×4 IMPLANT
COVER SURGICAL LIGHT HANDLE (MISCELLANEOUS) ×2 IMPLANT
CUFF TOURNIQUET SINGLE 34IN LL (TOURNIQUET CUFF) ×2 IMPLANT
CUFF TOURNIQUET SINGLE 44IN (TOURNIQUET CUFF) IMPLANT
DRAPE EXTREMITY T 121X128X90 (DRAPE) ×2 IMPLANT
DRAPE IMP U-DRAPE 54X76 (DRAPES) ×2 IMPLANT
DRAPE U-SHAPE 47X51 STRL (DRAPES) ×2 IMPLANT
DURAPREP 26ML APPLICATOR (WOUND CARE) ×4 IMPLANT
ELECT REM PT RETURN 9FT ADLT (ELECTROSURGICAL) ×2
ELECTRODE REM PT RTRN 9FT ADLT (ELECTROSURGICAL) ×1 IMPLANT
EVACUATOR 1/8 PVC DRAIN (DRAIN) ×2 IMPLANT
GAUZE SPONGE 4X4 12PLY STRL (GAUZE/BANDAGES/DRESSINGS) ×4 IMPLANT
GAUZE XEROFORM 1X8 LF (GAUZE/BANDAGES/DRESSINGS) ×2 IMPLANT
GLOVE BIO SURGEON STRL SZ7.5 (GLOVE) ×4 IMPLANT
GLOVE BIO SURGEON STRL SZ8.5 (GLOVE) ×2 IMPLANT
GLOVE BIOGEL PI IND STRL 6.5 (GLOVE) ×2 IMPLANT
GLOVE BIOGEL PI IND STRL 8 (GLOVE) ×1 IMPLANT
GLOVE BIOGEL PI IND STRL 9 (GLOVE) ×1 IMPLANT
GLOVE BIOGEL PI INDICATOR 6.5 (GLOVE) ×2
GLOVE BIOGEL PI INDICATOR 8 (GLOVE) ×1
GLOVE BIOGEL PI INDICATOR 9 (GLOVE) ×1
GLOVE SURG SS PI 6.0 STRL IVOR (GLOVE) ×2 IMPLANT
GLOVE SURG SS PI 6.5 STRL IVOR (GLOVE) ×2 IMPLANT
GLOVE SURG SS PI 8.0 STRL IVOR (GLOVE) ×4 IMPLANT
GOWN STRL REUS W/ TWL LRG LVL3 (GOWN DISPOSABLE) ×1 IMPLANT
GOWN STRL REUS W/ TWL XL LVL3 (GOWN DISPOSABLE) ×2 IMPLANT
GOWN STRL REUS W/TWL 2XL LVL3 (GOWN DISPOSABLE) ×2 IMPLANT
GOWN STRL REUS W/TWL LRG LVL3 (GOWN DISPOSABLE) ×1
GOWN STRL REUS W/TWL XL LVL3 (GOWN DISPOSABLE) ×2
HANDPIECE INTERPULSE COAX TIP (DISPOSABLE) ×1
HOOD PEEL AWAY FACE SHEILD DIS (HOOD) ×4 IMPLANT
KIT BASIN OR (CUSTOM PROCEDURE TRAY) ×2 IMPLANT
KIT ROOM TURNOVER OR (KITS) ×2 IMPLANT
MANIFOLD NEPTUNE II (INSTRUMENTS) ×2 IMPLANT
NDL SAFETY ECLIPSE 18X1.5 (NEEDLE) ×1 IMPLANT
NEEDLE 22X1 1/2 (OR ONLY) (NEEDLE) ×2 IMPLANT
NEEDLE HYPO 18GX1.5 SHARP (NEEDLE) ×1
NEEDLE SPNL 18GX3.5 QUINCKE PK (NEEDLE) ×2 IMPLANT
NS IRRIG 1000ML POUR BTL (IV SOLUTION) ×2 IMPLANT
PACK TOTAL JOINT (CUSTOM PROCEDURE TRAY) ×2 IMPLANT
PACK UNIVERSAL I (CUSTOM PROCEDURE TRAY) ×2 IMPLANT
PAD ARMBOARD 7.5X6 YLW CONV (MISCELLANEOUS) ×4 IMPLANT
PADDING CAST COTTON 6X4 STRL (CAST SUPPLIES) ×2 IMPLANT
SET HNDPC FAN SPRY TIP SCT (DISPOSABLE) ×1 IMPLANT
SPONGE LAP 18X18 X RAY DECT (DISPOSABLE) ×2 IMPLANT
SUCTION FRAZIER TIP 10 FR DISP (SUCTIONS) ×2 IMPLANT
SUT VIC AB 0 CT1 27 (SUTURE) ×1
SUT VIC AB 0 CT1 27XBRD ANBCTR (SUTURE) ×1 IMPLANT
SUT VIC AB 1 CTX 36 (SUTURE) ×1
SUT VIC AB 1 CTX36XBRD ANBCTR (SUTURE) ×1 IMPLANT
SUT VIC AB 2-0 CT1 27 (SUTURE) ×2
SUT VIC AB 2-0 CT1 TAPERPNT 27 (SUTURE) ×2 IMPLANT
SUT VIC AB 3-0 CT1 27 (SUTURE) ×1
SUT VIC AB 3-0 CT1 TAPERPNT 27 (SUTURE) ×1 IMPLANT
SUT VIC AB 3-0 FS2 27 (SUTURE) ×2 IMPLANT
SYR 30ML LL (SYRINGE) ×2 IMPLANT
SYR 50ML LL SCALE MARK (SYRINGE) ×2 IMPLANT
TOWEL OR 17X24 6PK STRL BLUE (TOWEL DISPOSABLE) ×2 IMPLANT
TOWEL OR 17X26 10 PK STRL BLUE (TOWEL DISPOSABLE) ×2 IMPLANT
WATER STERILE IRR 1000ML POUR (IV SOLUTION) ×2 IMPLANT

## 2015-02-27 NOTE — Op Note (Signed)
PATIENT ID:      Cynthia Velasquez  MRN:     [id number] DOB/AGE:    1951-05-05 / 64 y.o.       OPERATIVE REPORT    DATE OF PROCEDURE:  02/27/2015       PREOPERATIVE DIAGNOSIS:   RIGHT KNEE OSTEOARTHRITIS       Estimated body mass index is 39.09 kg/(m^2) as calculated from the following:   Height as of this encounter: 5\' 5"  (1.651 m).   Weight as of this encounter: 106.539 kg (234 lb 14 oz).                                                        POSTOPERATIVE DIAGNOSIS:   RIGHT KNEE OSTEOARTHRITIS                                                                       PROCEDURE:  Procedure(s): TOTAL KNEE ARTHROPLASTY Using Depuy Sigma RP implants #3R Femur, #4Tibia, 51mm Sigma RP bearing, 38 Patella     SURGEON: Skylie Hiott J    ASSISTANT:   [employer]   (Present and scrubbed throughout the case, critical for assistance with exposure, retraction, instrumentation, and closure.)         ANESTHESIA: Spinal, LMA, Exparel  EBL: 300  FLUID REPLACEMENT: 1600 crystalloid TOURNIQUET TIME: 15 min  DRAINS: none  Tranexamic Acid: 1gm IV  COMPLICATIONS:  None        INDICATIONS FOR PROCEDURE: The patient has  RIGHT KNEE OSTEOARTHRITIS , varus deformities, XR shows bone on bone arthritis. Patient has failed all conservative measures including anti-inflammatory medicines, narcotics, attempts at  exercise and weight loss, cortisone injections and viscosupplementation.  Risks and benefits of surgery have been discussed, questions answered.   DESCRIPTION OF PROCEDURE: The patient identified by armband, received  IV antibiotics, in the holding area at [facility]. Patient taken to the operating room, appropriate anesthetic monitors were attached, and general endotracheal anesthesia induced with the patient in supine position. Tourniquet applied high to the operative thigh. Lateral post and foot positioner applied to the table, the lower extremity was then prepped and draped in usual sterile  fashion from the ankle to the tourniquet. Time-out procedure was performed.  We began the operation with the knee flexed 100 degrees, by making an anterior midline incision starting at handbreadth above the patella going over the patella 1 cm medial to and 4 cm distal to the tibial tubercle. Small bleeders in the skin and the subcutaneous tissue identified and cauterized. Transverse retinaculum was incised and reflected medially and a medial parapatellar arthrotomy was accomplished. the patella was everted and the prepatellar fat pad resected. The superficial medial collateral ligament was then elevated from anterior to posterior along the proximal flare of the tibia and anterior half of the menisci resected. The knee was hyperflexed exposing bone on bone arthritis. Peripheral and notch osteophytes as well as the cruciate ligaments were then resected. We continued to work our way around posteriorly along the proximal tibia, and externally rotated the tibia subluxing it  out from underneath the femur. A McHale retractor was placed through the notch and a lateral Hohmann retractor placed, and we then drilled through the proximal tibia in line with the  axis of the tibia followed by an intramedullary guide rod and 2 degree  posterior slope cutting guide. The tibial cutting guide was pinned into place  allowing resection of 8 mm of bone medially and 1 mm of bone laterally. Satisfied with the tibial resection, we then entered the distal femur 2 mm anterior to the PCL origin with the intramedullary guide rod and applied the distal femoral cutting guide  set at 58mm, with 5 degrees of valgus. This was pinned along the  epicondylar axis. At this point, the distal femoral cut was accomplished without difficulty. We then sized for a #3R femoral component and pinned the guide in 3 degrees of external rotation.The chamfer cutting guide was pinned into place. The anterior, posterior, and chamfer cuts were accomplished  without difficulty followed by  the box cutting guide and the box cut. We also removed posterior osteophytes from the posterior femoral condyles. At this time, the knee was brought into full extension. We checked our extension and flexion gaps and found them symmetric at 57mm. Distracting with a lamina spreader tthe posterior horns of the menisci were resected using electrocautery, and exparel, diluted to 60cc, was injected into the capsule.  The patella thickness measured at 25 mm. We set the cutting guide at 15 and removed the posterior 10 mm of the patella, sized for a 38 button and drilled the fixation pegs. The knee was then once again hyperflexed exposing the proximal tibia. We sized for a # 4 tibial base plate, applied the smokestack and the conical reamer followed by the the Delta fin keel punch. We then hammered into place the Sigma RP trial femoral component, and drilled the fixation lugs. We then inserted a  10 mm trial bearing, trial patellar button, and took the knee through range of motion from 0-130 degrees. No thumb pressure was required for patellar tracking. The limb was wrapped with an Esmarch bandage and the tourniquet inflated to 350 mmHg with the knee flexed. At this point, all trial components were removed. Bone mating surfaces were irrigated with pulse lavage, dried with suction and sponges. A double batch of DePuy HV cement with 1500 mg of Zinacef was mixed and applied to all bony metallic mating surfaces except for the posterior condyles of the femur itself. In order, we hammered into place the tibial tray and removed excess cement, the femoral component and removed excess cement, a 10-mm Sigma RP bearing was inserted, and the knee brought to full extension with compression. The patellar button was clamped into place, and excess cement  removed. While the cement cured the wound was irrigated out with normal saline solution pulse lavage. Ligament stability and patellar tracking were  checked and found to be excellent. The tourniquet was let down,  bleeders were identified and cauterized.The parapatellar arthrotomy was closed with running #1 Vicryl suture. The subcutaneous tissue with 0 and 2-0 undyed Vicryl suture, and the skin with3-0 vicryl.  A dressing of Xeroform, 4 x 4, dressing sponges, Webril, and Ace wrap applied. The patient awakened, extubated, and taken to recovery room without difficulty.   Arlissa Monteverde J 02/27/2015, 10:34 AM

## 2015-02-27 NOTE — Care Management (Signed)
Utilization review completed by Dashia Caldeira N. Manson Luckadoo, RN BSN 

## 2015-02-27 NOTE — Anesthesia Postprocedure Evaluation (Signed)
Anesthesia Post Note  [name]  Procedure(s) Performed: Procedure(s) (LRB): TOTAL KNEE ARTHROPLASTY (Right)  Anesthesia type: General  Patient location: PACU  Post pain: Pain level controlled and Adequate analgesia  Post assessment: Post-op Vital signs reviewed, Patient's Cardiovascular Status Stable, Respiratory Function Stable, Patent Airway and Pain level controlled  Last Vitals:  Filed Vitals:   02/27/15 1245  BP:   Pulse: 51  Temp:   Resp: 13    Post vital signs: Reviewed and stable  Level of consciousness: awake, alert  and oriented  Complications: No apparent anesthesia complications

## 2015-02-27 NOTE — Anesthesia Preprocedure Evaluation (Addendum)
Anesthesia Evaluation  Patient identified by MRN, date of birth, ID band Patient awake    History of Anesthesia Complications Negative for: history of anesthetic complications  Airway Mallampati: II       Dental  (+) Teeth Intact   Pulmonary neg pulmonary ROS,  breath sounds clear to auscultation        Cardiovascular hypertension, Rhythm:Regular Rate:Normal     Neuro/Psych  Headaches,    GI/Hepatic GERD-  ,  Endo/Other  diabetesMorbid obesity  Renal/GU      Musculoskeletal  (+) Arthritis -, Hx of lumbar stensosis   Abdominal (+) + obese,   Peds  Hematology   Anesthesia Other Findings   Reproductive/Obstetrics                            Anesthesia Physical Anesthesia Plan  ASA: III  Anesthesia Plan: Spinal   Post-op Pain Management:    Induction: Intravenous  Airway Management Planned: Natural Airway and Simple Face Mask  Additional Equipment:   Intra-op Plan:   Post-operative Plan:   Informed Consent: I have reviewed the patients History and Physical, chart, labs and discussed the procedure including the risks, benefits and alternatives for the proposed anesthesia with the patient or authorized representative who has indicated his/her understanding and acceptance.     Plan Discussed with: Surgeon and CRNA  Anesthesia Plan Comments:        Anesthesia Quick Evaluation

## 2015-02-27 NOTE — Evaluation (Signed)
Physical Therapy Evaluation Patient Details Name: Cynthia Velasquez MRN: [id number] DOB: 14-Oct-1950 Today's Date: 02/27/2015   History of Present Illness  Patient is a 64 y/o female s/p R TKA. PMH includes R THA, breast ca, HTN, HLD, DM, neuropathy, depression.  Clinical Impression  Patient presents with pain, lethargy and post surgical deficits RLE s/p above surgery impacting mobility. Mobility assessment limited due to difficulty staying aroused during session secondary to pain medications. Daughter present during session and states pt does not have support at home. Would benefit from [facility] to maximize independence and mobility prior to return home.     Follow Up Recommendations SNF;Supervision/Assistance - 24 hour    Equipment Recommendations  None recommended by PT    Recommendations for Other Services       Precautions / Restrictions Precautions Precautions: Knee;Fall Precaution Booklet Issued: No Precaution Comments: Reviewed no pillow under knee. Restrictions Weight Bearing Restrictions: Yes RLE Weight Bearing: Weight bearing as tolerated      Mobility  Bed Mobility Overal bed mobility: Needs Assistance Bed Mobility: Supine to Sit;Sit to Supine     Supine to sit: HOB elevated;Min assist Sit to supine: Min assist   General bed mobility comments: Assist to bring RLE to EOB; assist to bring RLE into bed.   Transfers Overall transfer level: Needs assistance Equipment used: Rolling walker (2 wheeled) Transfers: Sit to/from Stand Sit to Stand: Mod assist         General transfer comment: Mod A to boost from EOB x1 with manual cues for hip extension and for balance. Requires stimulus to keep eyes open.   Ambulation/Gait Ambulation/Gait assistance: Min assist Ambulation Distance (Feet): 5 Feet Assistive device: Rolling walker (2 wheeled) Gait Pattern/deviations: Step-to pattern;Trunk flexed;Decreased stance time - right   Gait velocity interpretation: Below  normal speed for age/gender General Gait Details: Side stepped along side bed with cues for RW management and balance as pt with decreased arousal.  Stairs            Wheelchair Mobility    Modified Rankin (Stroke Patients Only)       Balance Overall balance assessment: Needs assistance Sitting-balance support: Feet supported;No upper extremity supported Sitting balance-Leahy Scale: Fair Sitting balance - Comments: Min guard sitting EOB secondary to decreased level of arousal, eyes closed (just given pain meds).   Standing balance support: During functional activity Standing balance-Leahy Scale: Poor Standing balance comment: Relient on RW and Min A from therapist for balance.                              Pertinent Vitals/Pain Pain Assessment: Faces Faces Pain Scale: Hurts whole lot Pain Location: right knee with movement.  Pain Descriptors / Indicators: Sore;Aching;Sharp;Burning Pain Intervention(s): Monitored during session;Limited activity within patient's tolerance;Premedicated before session;Repositioned    Home Living Family/patient expects to be discharged to:: Skilled nursing facility Living Arrangements: Alone                    Prior Function Level of Independence: Independent with assistive device(s)         Comments: Pt using SPC vs RW PTA.      Hand Dominance   Dominant Hand: Right    Extremity/Trunk Assessment   Upper Extremity Assessment: Defer to OT evaluation           Lower Extremity Assessment: RLE deficits/detail RLE Deficits / Details: Limited AROM secondary to pain and surgery.  Communication   Communication: No difficulties  Cognition Arousal/Alertness: Lethargic;Suspect due to medications Behavior During Therapy: [facility] for tasks assessed/performed Overall Cognitive Status: Within Functional Limits for tasks assessed                      General Comments General comments (skin integrity,  edema, etc.): Pt's daughter present during session.    Exercises Total Joint Exercises Ankle Circles/Pumps: Both;10 reps;Supine Quad Sets: Right;5 reps;Supine      Assessment/Plan    PT Assessment Patient needs continued PT services  PT Diagnosis Difficulty walking;Acute pain;Generalized weakness   PT Problem List Decreased strength;Pain;Decreased cognition;Decreased range of motion;Impaired sensation;Decreased activity tolerance;Decreased mobility;Decreased balance  PT Treatment Interventions Balance training;Gait training;Functional mobility training;Therapeutic activities;Therapeutic exercise;Patient/family education   PT Goals (Current goals can be found in the Care Plan section) Acute Rehab PT Goals Patient Stated Goal: none stated PT Goal Formulation: Patient unable to participate in goal setting Time For Goal Achievement: 03/13/15 Potential to Achieve Goals: Fair    Frequency 7X/week   Barriers to discharge Decreased caregiver support Lives alone    Co-evaluation               End of Session Equipment Utilized During Treatment: Gait belt Activity Tolerance: Patient limited by lethargy Patient left: in bed;with call bell/phone within reach;with bed alarm set;with family/visitor present Nurse Communication: Mobility status         Time: 1435-1500 PT Time Calculation (min) (ACUTE ONLY): 25 min   Charges:   PT Evaluation $Initial PT Evaluation Tier I: 1 Procedure PT Treatments $Therapeutic Activity: 8-22 mins   PT G Codes:        Cynthia Velasquez 02/27/2015, 3:08 PM Wray Kearns, [place], DPT [phone]

## 2015-02-27 NOTE — Transfer of Care (Signed)
Immediate Anesthesia Transfer of Care Note  [name]  Procedure(s) Performed: Procedure(s): TOTAL KNEE ARTHROPLASTY (Right)  Patient Location: PACU  Anesthesia Type:MAC, General and Spinal  Level of Consciousness: awake, alert  and oriented  Airway & Oxygen Therapy: Patient Spontanous Breathing and Patient connected to face mask oxygen  Post-op Assessment: Report given to RN and Post -op Vital signs reviewed and stable  Post vital signs: Reviewed and stable  Last Vitals:  Filed Vitals:   02/27/15 0759  BP: 157/88  Pulse: 59  Temp: 36.8 C  Resp: 20    Complications: No apparent anesthesia complications

## 2015-02-27 NOTE — Interval H&P Note (Signed)
History and Physical Interval Note:  02/27/2015 7:18 AM  Cynthia Velasquez  has presented today for surgery, with the diagnosis of RIGHT KNEE OSTEOARTHRITIS   The various methods of treatment have been discussed with the patient and family. After consideration of risks, benefits and other options for treatment, the patient has consented to  Procedure(s): TOTAL KNEE ARTHROPLASTY (Right) as a surgical intervention .  The patient's history has been reviewed, patient examined, no change in status, stable for surgery.  I have reviewed the patient's chart and labs.  Questions were answered to the patient's satisfaction.     Kerin Salen

## 2015-02-27 NOTE — Progress Notes (Signed)
Belongings sent with family member

## 2015-02-27 NOTE — Progress Notes (Signed)
Orthopedic Tech Progress Note Patient Details:  Cynthia Velasquez [birth year] [id number] CPM applied to RLE with appropriate settings. OHF applied to bed. CPM Right Knee CPM Right Knee: On Right Knee Flexion (Degrees): 10 Right Knee Extension (Degrees): 40   [name] 02/27/2015, 11:59 AM

## 2015-02-27 NOTE — Anesthesia Procedure Notes (Addendum)
Spinal Patient location during procedure: OR Start time: 02/27/2015 9:04 AM End time: 02/27/2015 9:10 AM Staffing Anesthesiologist: MASSAGEE, TERRY Performed by: anesthesiologist  Preanesthetic Checklist Completed: patient identified, site marked, surgical consent, pre-op evaluation, timeout performed, IV checked, risks and benefits discussed and monitors and equipment checked Spinal Block Patient position: sitting Prep: ChloraPrep Patient monitoring: heart rate, cardiac monitor, continuous pulse ox and blood pressure Approach: midline Location: L3-4 Injection technique: single-shot Needle Needle type: Quincke  Needle gauge: 25 G Needle length: 5 cm Needle insertion depth: 4.5 cm Assessment Sensory level: T6 Additional Notes Tolerated well  Procedure Name: LMA Insertion Date/Time: 02/27/2015 9:41 AM Performed by: Clearnce Sorrel Pre-anesthesia Checklist: Patient identified, Timeout performed, Emergency Drugs available, Suction available and Patient being monitored Patient Re-evaluated:Patient Re-evaluated prior to inductionOxygen Delivery Method: Circle system utilized Preoxygenation: Pre-oxygenation with 100% oxygen Intubation Type: IV induction Ventilation: Mask ventilation without difficulty LMA: LMA inserted LMA Size: 4.0 Number of attempts: 1 Placement Confirmation: positive ETCO2 and breath sounds checked- equal and bilateral Tube secured with: Tape Dental Injury: Teeth and Oropharynx as per pre-operative assessment

## 2015-02-28 ENCOUNTER — Encounter (HOSPITAL_COMMUNITY): Payer: Self-pay | Admitting: Orthopedic Surgery

## 2015-02-28 LAB — BASIC METABOLIC PANEL
ANION GAP: 6 (ref 5–15)
BUN: 14 mg/dL (ref 6–20)
CHLORIDE: 102 mmol/L (ref 101–111)
CO2: 28 mmol/L (ref 22–32)
Calcium: 7.9 mg/dL — ABNORMAL LOW (ref 8.9–10.3)
Creatinine, Ser: 1.04 mg/dL — ABNORMAL HIGH (ref 0.44–1.00)
GFR calc Af Amer: >60 mL/min (ref >60)
GFR calc non Af Amer: 56 mL/min — ABNORMAL LOW (ref >60)
Glucose, Bld: 123 mg/dL — ABNORMAL HIGH (ref 65–99)
Potassium: 3.6 mmol/L (ref 3.5–5.1)
Sodium: 136 mmol/L (ref 135–145)

## 2015-02-28 LAB — CBC
HEMATOCRIT: 30.3 % — AB (ref 36.0–46.0)
HEMOGLOBIN: 9.6 g/dL — AB (ref 12.0–15.0)
MCH: 26.1 pg (ref 26.0–34.0)
MCHC: 31.7 g/dL (ref 30.0–36.0)
MCV: 82.3 fL (ref 78.0–100.0)
Platelets: 211 10*3/uL (ref 150–400)
RBC: 3.68 MIL/uL — ABNORMAL LOW (ref 3.87–5.11)
RDW: 14.9 % (ref 11.5–15.5)
WBC: 7.5 10*3/uL (ref 4.0–10.5)

## 2015-02-28 NOTE — Progress Notes (Signed)
Occupational Therapy Evaluation Patient Details Name: Cynthia Velasquez MRN: [id number] DOB: [birth year] Today's Date: 02/28/2015    History of Present Illness Patient is a 64 y/o female s/p R TKA. PMH includes R THA, breast ca, HTN, HLD, DM, neuropathy, depression.   Clinical Impression   Pt admitted with the above diagnoses and presents with below problem list. Pt will benefit from continued OT to address the below listed deficits and maximize independence with BADLs prior to d/c to venue below. PTA pt was independent with ADLs. Pt is currently min-mod A for LB ADLs, transfers, and functional mobility. ADLs and education completed as detailed below. OT to continue to follow acutely.      Follow Up Recommendations  SNF    Equipment Recommendations  None recommended by OT    Recommendations for Other Services       Precautions / Restrictions Precautions Precautions: Knee;Fall Precaution Booklet Issued: Yes (comment) Precaution Comments: Reviewed no pillow under knee. Restrictions Weight Bearing Restrictions: Yes RLE Weight Bearing: Weight bearing as tolerated      Mobility Bed Mobility Overal bed mobility: Needs Assistance Bed Mobility: Sit to Supine     Supine to sit: Min assist;HOB elevated Sit to supine: Min assist   General bed mobility comments: min A to advance RLE with heavy use of rails, bed in trendelenburg position  Transfers Overall transfer level: Needs assistance Equipment used: Rolling walker (2 wheeled) Transfers: Sit to/from Stand Sit to Stand: Min assist         General transfer comment: from recliner, A for power up and to control descent    Balance Overall balance assessment: Needs assistance Sitting-balance support: Feet supported Sitting balance-Leahy Scale: Good     Standing balance support: Bilateral upper extremity supported;During functional activity Standing balance-Leahy Scale: Poor Standing balance comment: rw and asssit from  therapist for balance                            ADL Overall ADL's : Needs assistance/impaired Eating/Feeding: Set up;Sitting   Grooming: Set up;Sitting   Upper Body Bathing: Set up;Sitting   Lower Body Bathing: Minimal assistance;Sit to/from stand;With adaptive equipment   Upper Body Dressing : Set up;Sitting   Lower Body Dressing: Minimal assistance;Sit to/from stand;With adaptive equipment   Toilet Transfer: Minimal assistance;Ambulation;BSC;RW   Toileting- Clothing Manipulation and Hygiene: Sit to/from stand;Moderate assistance   Tub/ Shower Transfer: Minimal assistance;Ambulation;3 in 1;Rolling walker;Shower seat   Functional mobility during ADLs: Minimal assistance;Rolling walker General ADL Comments: Pt ambulated from recliner to EOB and completed bed mobility, all with min A and cues for technique. Pt limited by pain this session, 10/10. ADL education provided.      Vision     Perception     Praxis      Pertinent Vitals/Pain Pain Assessment: 0-10 Pain Score: 10-Worst pain ever Faces Pain Scale: Hurts whole lot Pain Location: rt knee with movement Pain Descriptors / Indicators: Aching;Burning;Sore Pain Intervention(s): Limited activity within patient's tolerance;Monitored during session;Repositioned;Patient requesting pain meds-RN notified;Utilized relaxation techniques     Hand Dominance Right   Extremity/Trunk Assessment Upper Extremity Assessment Upper Extremity Assessment: Overall WFL for tasks assessed   Lower Extremity Assessment Lower Extremity Assessment: Defer to PT evaluation       Communication Communication Communication: No difficulties   Cognition Arousal/Alertness: Awake/alert Behavior During Therapy: WFL for tasks assessed/performed Overall Cognitive Status: Within Functional Limits for tasks assessed  General Comments       Exercises       Shoulder Instructions      Home Living  Family/patient expects to be discharged to:: Skilled nursing facility Living Arrangements: Alone                                      Prior Functioning/Environment Level of Independence: Independent with assistive device(s)        Comments: Pt using SPC vs RW PTA.     OT Diagnosis: Acute pain   OT Problem List: Impaired balance (sitting and/or standing);Decreased knowledge of use of DME or AE;Decreased knowledge of precautions;Pain   OT Treatment/Interventions: Self-care/ADL training;DME and/or AE instruction;Therapeutic activities;Patient/family education;Balance training    OT Goals(Current goals can be found in the care plan section) Acute Rehab OT Goals Patient Stated Goal: none stated OT Goal Formulation: With patient Time For Goal Achievement: 03/07/15 Potential to Achieve Goals: Good ADL Goals Pt Will Perform Lower Body Dressing: with min guard assist;sit to/from stand;with adaptive equipment Pt Will Transfer to Toilet: with min guard assist;ambulating (3n1 over toilet) Pt Will Perform Toileting - Clothing Manipulation and hygiene: with adaptive equipment;sit to/from stand;with min guard assist Pt Will Perform Tub/Shower Transfer: Tub transfer;with min guard assist;3 in 1;rolling walker;ambulating Additional ADL Goal #1: Pt will complete bed mobility at min guard level to prepare for OOB ADLs.   OT Frequency: Min 2X/week   Barriers to D/C: Decreased caregiver support          Co-evaluation              End of Session Equipment Utilized During Treatment: Gait belt;Rolling walker CPM Right Knee CPM Right Knee: On Nurse Communication: Patient requests pain meds  Activity Tolerance: Patient limited by pain Patient left: in bed;with call bell/phone within reach;Other (comment) (in CPM)   Time: 7482-7078 OT Time Calculation (min): 26 min Charges:  OT General Charges $OT Visit: 1 Procedure OT Evaluation $Initial OT Evaluation Tier I: 1  Procedure OT Treatments $Self Care/Home Management : 8-22 mins G-Codes:    Hortencia Pilar 03-05-2015, 12:31 PM

## 2015-02-28 NOTE — Progress Notes (Signed)
Patient ID: Cynthia Velasquez, female   DOB: 11-05-1950, 64 y.o.   MRN: [id number] PATIENT ID: Cynthia Velasquez  MRN: [id number]  DOB/AGE:  January 09, 1951 / 64 y.o.  1 Day Post-Op Procedure(s) (LRB): TOTAL KNEE ARTHROPLASTY (Right)    PROGRESS NOTE Subjective: Patient is alert, oriented, x1 Nausea, no Vomiting, yes passing gas, no Bowel Movement. Taking PO well. Denies SOB, Chest or Calf Pain. Using Incentive Spirometer, PAS in place. Ambulate 5 feet, CPM 0-40 Patient reports pain as 3 on 0-10 scale  .    Objective: Vital signs in last 24 hours: Filed Vitals:   02/27/15 1327 02/27/15 2209 02/28/15 0027 02/28/15 0517  BP: 92/61 136/64 111/63 118/68  Pulse: 48 65 66 78  Temp: 97.8 F (36.6 C) 97.8 F (36.6 C) 99 F (37.2 C) 99.6 F (37.6 C)  TempSrc:  Oral  Oral  Resp: 10 14 15 15   Height:      Weight:      SpO2: 97% 96% 97% 98%      Intake/Output from previous day: I/O last 3 completed shifts: In: 8329 [P.O.:240; I.V.:1300] Out: -    Intake/Output this shift: Total I/O In: 240 [P.O.:240] Out: -    LABORATORY DATA:  Recent Labs  02/27/15 0805  GLUCAP 98    Examination: Neurologically intact ABD soft Neurovascular intact Sensation intact distally Intact pulses distally Dorsiflexion/Plantar flexion intact Incision: dressing C/D/I No cellulitis present Compartment soft}  Assessment:   1 Day Post-Op Procedure(s) (LRB): TOTAL KNEE ARTHROPLASTY (Right) ADDITIONAL DIAGNOSIS: Expected Acute Blood Loss Anemia,   Plan: PT/OT WBAT, CPM 5/hrs day until ROM 0-90 degrees, then D/C CPM DVT Prophylaxis:  SCDx72hrs, ASA 325 mg BID x 2 weeks DISCHARGE PLAN: Skilled Nursing Facility/Rehab, [facility] DISCHARGE NEEDS: HHPT, [place], Walker and 3-in-1 comode seat     Havard Radigan J 02/28/2015, 6:11 AM

## 2015-02-28 NOTE — Plan of Care (Signed)
Problem: Consults Goal: Diagnosis- Total Joint Replacement Outcome: Completed/Met Date Met:  02/28/15 Primary Total Knee Right

## 2015-02-28 NOTE — Progress Notes (Signed)
Physical Therapy Treatment Patient Details Name: Cynthia Velasquez MRN: [id number] DOB: 08-08-1951 Today's Date: 02/28/2015    History of Present Illness Patient is a 64 y/o female s/p R TKA. PMH includes R THA, breast ca, HTN, HLD, DM, neuropathy, depression.    PT Comments    Patient progressing slowly towards PT goals. Improved ambulation distance. Pt takes increased time to perform all transfers/mobility secondary to pain and anticipation of pain. Reviewed handout and exercises. Will continue to follow to maximize independence and mobility. Appropriate for [facility].   Follow Up Recommendations  SNF;Supervision/Assistance - 24 hour     Equipment Recommendations  None recommended by PT    Recommendations for Other Services       Precautions / Restrictions Precautions Precautions: Knee;Fall Precaution Booklet Issued: Yes (comment) Precaution Comments: Reviewed no pillow under knee. Restrictions Weight Bearing Restrictions: Yes RLE Weight Bearing: Weight bearing as tolerated    Mobility  Bed Mobility Overal bed mobility: Needs Assistance Bed Mobility: Supine to Sit     Supine to sit: Min assist;HOB elevated     General bed mobility comments: Min A to bring RLE to EOB.  Transfers Overall transfer level: Needs assistance Equipment used: Rolling walker (2 wheeled) Transfers: Sit to/from Stand Sit to Stand: Min assist         General transfer comment: Min A to boost from EOB x1, from toilet x1. Cues for controlled descent into chair.   Ambulation/Gait Ambulation/Gait assistance: Min assist Ambulation Distance (Feet): 20 Feet (x2 bouts) Assistive device: Rolling walker (2 wheeled) Gait Pattern/deviations: Decreased stance time - right;Decreased step length - left;Trunk flexed;Step-to pattern   Gait velocity interpretation: Below normal speed for age/gender General Gait Details: Slow, unsteady gait with frequent rest breaks with pt leaning on RW due to pain in  shoulders. Increased knee flexion through gait despite cues for knee extension during stance phase.    Stairs            Wheelchair Mobility    Modified Rankin (Stroke Patients Only)       Balance Overall balance assessment: Needs assistance Sitting-balance support: Feet supported;No upper extremity supported Sitting balance-Leahy Scale: Good     Standing balance support: During functional activity Standing balance-Leahy Scale: Poor                      Cognition Arousal/Alertness: Awake/alert Behavior During Therapy: WFL for tasks assessed/performed Overall Cognitive Status: Within Functional Limits for tasks assessed                      Exercises Total Joint Exercises Ankle Circles/Pumps: Both;15 reps;Seated Quad Sets: Right;10 reps;Seated Towel Squeeze: Both;15 reps;Seated Heel Slides: Right;10 reps;Seated Goniometric ROM: 6-70 degrees knee AROM    General Comments        Pertinent Vitals/Pain Pain Assessment: Faces Faces Pain Scale: Hurts whole lot Pain Location: right knee with movement Pain Descriptors / Indicators: Sore;Aching;Sharp Pain Intervention(s): Limited activity within patient's tolerance;Monitored during session;Repositioned    Home Living                      Prior Function            PT Goals (current goals can now be found in the care plan section) Progress towards PT goals: Progressing toward goals    Frequency  7X/week    PT Plan Current plan remains appropriate    Co-evaluation  End of Session Equipment Utilized During Treatment: Gait belt Activity Tolerance: Patient tolerated treatment well;Patient limited by pain Patient left: in chair;with call bell/phone within reach     Time: 1026-1115 PT Time Calculation (min) (ACUTE ONLY): 49 min  Charges:  $Gait Training: 8-22 mins $Therapeutic Exercise: 8-22 mins $Therapeutic Activity: 8-22 mins                    G Codes:       Cynthia Velasquez 02/28/2015, 12:09 PM Wray Kearns, [place], DPT [phone]

## 2015-02-28 NOTE — Care Management Note (Signed)
Case Management Note  Patient Details  Name: [name] MRN: [id number] Date of Birth: Sep 13, 1951  Subjective/Objective:                 S/p right total knee arthroplasty   Action/Plan:  PT/OT recommended SNF. Referral made to CSW.  Expected Discharge Date:                  Expected Discharge Plan:  Skilled Nursing Facility  In-House Referral:  Clinical Social Work  Discharge planning Services  CM Consult  Post Acute Care Choice:  NA Choice offered to:  NA  DME Arranged:    DME Agency:     HH Arranged:    [place] Agency:     Status of Service:  Completed, signed off  Medicare Important Message Given:    Date Medicare IM Given:    Medicare IM give by:    Date Additional Medicare IM Given:    Additional Medicare Important Message give by:     If discussed at [place] of Stay Meetings, dates discussed:    Additional Comments:  Nila Nephew, RN 02/28/2015, 4:03 PM

## 2015-03-01 LAB — CBC
HEMATOCRIT: 29.7 % — AB (ref 36.0–46.0)
Hemoglobin: 9.6 g/dL — ABNORMAL LOW (ref 12.0–15.0)
MCH: 26.2 pg (ref 26.0–34.0)
MCHC: 32.3 g/dL (ref 30.0–36.0)
MCV: 80.9 fL (ref 78.0–100.0)
Platelets: 221 10*3/uL (ref 150–400)
RBC: 3.67 MIL/uL — ABNORMAL LOW (ref 3.87–5.11)
RDW: 14.8 % (ref 11.5–15.5)
WBC: 10.8 10*3/uL — AB (ref 4.0–10.5)

## 2015-03-01 NOTE — Discharge Summary (Signed)
Patient ID: Cynthia Velasquez MRN: [id number] DOB/AGE: 10-08-1950 64 y.o.  Admit date: 02/27/2015 Discharge date: 03/01/2015  Admission Diagnoses:  Principal Problem:   Primary osteoarthritis of right knee Valgus Active Problems:   Arthritis of knee   Discharge Diagnoses:  Same  Past Medical History  Diagnosis Date  . Cancer   . Breast cancer   . Lumbar stenosis   . Hyperlipidemia     takes Simvastatin daily  . Hypertension     takes Hyzaar  . History of migraine   . Arthritis   . Joint pain   . Joint swelling   . GERD (gastroesophageal reflux disease)     doesn't take any meds for this  . History of bladder infections   . Gallstones   . Neuropathy 09/07/2013  . Bilateral breast cancer 10/21/2013  . Headache     h/o migraines      . Depression   . Diabetes mellitus without complication     pre diabetic but takes no meds--diet controlled    Surgeries: Procedure(s): TOTAL KNEE ARTHROPLASTY on 02/27/2015   Consultants:    Discharged Condition: Improved  Hospital Course: Mardene Lessig is an 64 y.o. female who was admitted 02/27/2015 for operative treatment ofPrimary osteoarthritis of right knee. Patient has severe unremitting pain that affects sleep, daily activities, and work/hobbies. After pre-op clearance the patient was taken to the operating room on 02/27/2015 and underwent  Procedure(s): TOTAL KNEE ARTHROPLASTY.    Patient was given perioperative antibiotics: Anti-infectives    Start     Dose/Rate Route Frequency Ordered Stop   02/27/15 0728  cefUROXime (ZINACEF) injection  Status:  Discontinued       As needed 02/27/15 0728 02/27/15 1116   02/27/15 0717  ceFAZolin (ANCEF) IVPB 2 g/50 mL premix     2 g 100 mL/hr over 30 Minutes Intravenous On call to O.R. 02/27/15 0717 02/27/15 0906       Patient was given sequential compression devices, early ambulation, and chemoprophylaxis to prevent DVT.  Patient benefited maximally from hospital stay and there were no  complications.    Recent vital signs: Patient Vitals for the past 24 hrs:  BP Temp Pulse Resp SpO2  03/01/15 0518 (!) 89/58 mmHg 99.7 F (37.6 C) 85 18 95 %  02/28/15 2056 129/74 mmHg - 77 16 97 %  02/28/15 1400 134/65 mmHg 99 F (37.2 C) 74 16 94 %     Recent laboratory studies:  Recent Labs  02/28/15 0535 03/01/15 0543  WBC 7.5 10.8*  HGB 9.6* 9.6*  HCT 30.3* 29.7*  PLT 211 221  NA 136  --   K 3.6  --   CL 102  --   CO2 28  --   BUN 14  --   CREATININE 1.04*  --   GLUCOSE 123*  --   CALCIUM 7.9*  --      Discharge Medications:     Medication List    TAKE these medications        aspirin EC 325 MG tablet  Take 1 tablet (325 mg total) by mouth 2 (two) times daily.     azithromycin 250 MG tablet  Commonly known as:  ZITHROMAX  Take 250-500 mg by mouth daily. 500 mg on day 1, then 250 mg daily for 5 days -- 6 day course     diphenhydrAMINE 25 MG tablet  Commonly known as:  BENADRYL  Take 25 mg by mouth as needed for itching or allergies.  HYDROmorphone 4 MG tablet  Commonly known as:  DILAUDID  Take 0.5-1 tablets (2-4 mg total) by mouth every 4 (four) hours as needed for severe pain.     losartan-hydrochlorothiazide 50-12.5 MG per tablet  Commonly known as:  HYZAAR  Take 0.5-1 tablets by mouth 2 (two) times daily. Take one tablet every morning and take half of a tablet every evening     methocarbamol 500 MG tablet  Commonly known as:  ROBAXIN  Take 1 tablet (500 mg total) by mouth 4 (four) times daily.     promethazine 25 MG tablet  Commonly known as:  PHENERGAN  Take 12.5-25 mg by mouth every 6 (six) hours as needed for nausea.     sertraline 50 MG tablet  Commonly known as:  ZOLOFT  Take 50 mg by mouth daily.     SUMAtriptan 50 MG tablet  Commonly known as:  IMITREX  Take 50 mg by mouth every 2 (two) hours as needed for migraine.     traMADol 50 MG tablet  Commonly known as:  ULTRAM  Take 50 mg by mouth every 6 (six) hours as needed (pain).         Diagnostic Studies: Dg Chest 2 View  02/15/2015   CLINICAL DATA:  64 year old female with impending right knee arthroplasty. Preoperative chest x-ray. No current chest complaints.  EXAM: CHEST  2 VIEW  COMPARISON:  Prior chest x-ray 09/02/2014  FINDINGS: Cardiac and mediastinal contours are within normal limits. The lungs are clear. No pulmonary edema, pleural effusion, pneumothorax or focal airspace consolidation. Pulmonary hyperinflation and chronic bronchitic changes are similar compared to prior. Surgical clips present in both axilla. Dextro convex scoliosis of the lumbar spine.  IMPRESSION: No active cardiopulmonary disease.  Background chronic pulmonary parenchymal changes suggest underlying COPD.  Incompletely imaged dextro convex lumbar scoliosis.   Electronically Signed   By: Jacqulynn Cadet M.D.   On: 02/15/2015 14:52    Disposition: 01-Home or Self Care      Discharge Instructions    CPM    Complete by:  As directed   Continuous passive motion machine (CPM):      Use the CPM from 0 to 60 for 5 hours per day.      You may increase by 10 deg per day.  You may break it up into 2 or 3 sessions per day.      Use CPM for 1-2 weeks or until you are told to stop.     Call MD / Call 911    Complete by:  As directed   If you experience chest pain or shortness of breath, CALL 911 and be transported to the hospital emergency room.  If you develope a fever above 101 F, pus (white drainage) or increased drainage or redness at the wound, or calf pain, call your surgeon's office.     Change dressing    Complete by:  As directed   Change dressing prn     Constipation Prevention    Complete by:  As directed   Drink plenty of fluids.  Prune juice may be helpful.  You may use a stool softener, such as Colace (over the counter) 100 mg twice a day.  Use MiraLax (over the counter) for constipation as needed.     Diet - low sodium heart healthy    Complete by:  As directed      Increase  activity slowly as tolerated    Complete by:  As directed  Follow-up Information    Follow up with Kerin Salen, MD In 1 week.   Specialty:  Orthopedic Surgery   Contact information:   [place] [address] [phone]        Signed: Kerin Salen 03/01/2015, 8:27 AM

## 2015-03-01 NOTE — Clinical Social Work Placement (Signed)
   CLINICAL SOCIAL WORK PLACEMENT  NOTE  Date:  03/01/2015  Patient Details  Name: Cynthia Velasquez MRN: [id number] Date of Birth: [birth year]  Clinical Social Work is seeking post-discharge placement for this patient at the [place] level of care (*CSW will initial, date and re-position this form in  chart as items are completed):  Yes   Patient/family provided with [place] Work Department's list of facilities offering this level of care within the geographic area requested by the patient (or if unable, by the patient's family).  Yes   Patient/family informed of their freedom to choose among providers that offer the needed level of care, that participate in Medicare, Medicaid or managed care program needed by the patient, have an available bed and are willing to accept the patient.  Yes   Patient/family informed of [facility]'s ownership interest in [facility] and [facility], as well as of the fact that they are under no obligation to receive care at these facilities.  PASRR submitted to EDS on 03/01/15     PASRR number received on 03/01/15     Existing PASRR number confirmed on  (n/a)     FL2 transmitted to all facilities in geographic area requested by pt/family on 03/01/15     FL2 transmitted to all facilities within larger geographic area on  (n/a)     Patient informed that his/her managed care company has contracts with or will negotiate with certain facilities, including the following:   (yes, [place])     Yes   Patient/family informed of bed offers received.  Patient chooses bed at [facility]     Physician recommends and patient chooses bed at  (n/a)    Patient to be transferred to [facility] on 03/01/15.  Patient to be transferred to facility by [facility]     Patient family notified on 03/01/15 of transfer.  Name of family member notified:  Patient updated at bedside.     PHYSICIAN       Additional Comment:     _______________________________________________ Caroline Sauger, LCSW 03/01/2015, 1:04 PM [phone]

## 2015-03-01 NOTE — Clinical Social Work Note (Signed)
Clinical Social Work Assessment  Patient Details  Name: Cynthia Velasquez MRN: [id number] Date of Birth: 12/10/1950  Date of referral:  03/01/15               Reason for consult:  Discharge Planning, Facility Placement                Permission sought to share information with:  Facility Art therapist granted to share information::  Yes, Verbal Permission Granted  Name::     n/a  Agency::  [facility]  Relationship::  n/a  Contact Information:  [phone]  Housing/Transportation Living arrangements for the past 2 months:  Single Family Home Source of Information:  Patient Patient Interpreter Needed:  None Criminal Activity/Legal Involvement Pertinent to Current Situation/Hospitalization:  No - Comment as needed Significant Relationships:  Adult Children Lives with:  Self Do you feel safe going back to the place where you live?  No (High fall risk.) Need for family participation in patient care:  No (Coment) (Patient able to make own decisions.)  Care giving concerns:  Patient expressed no concerns at this time.   Social Worker assessment / plan:  CSW received referral patient to be admitted to [facility] once medically stable for discharge. CSW met with patient to confirm discharge disposition. Patient confirmed patient to discharge to [facility] once medically stable for discharge. Patient informed CSW patient has a daughter who is intermittently available for support, but travels for work. Patient to be transported via EMS at time of discharge.  Employment status:  Other (Comment) (Patient did not disclose employment.) Insurance information:  Other (Comment Required) Nurse, mental health) PT Recommendations:  [place] / Referral to community resources:  [place]  Patient/Family's Response to care:  Patient understanding and agreeable to CSW plan of care.  Patient/Family's Understanding of and Emotional Response to Diagnosis,  Current Treatment, and Prognosis:  Patient understanding and agreeable to CSW plan of care.  Emotional Assessment Appearance:  Appears stated age Attitude/Demeanor/Rapport:  Other (Pleasant) Affect (typically observed):  Accepting, Appropriate, Pleasant Orientation:  Oriented to Self, Oriented to Place, Oriented to  Time, Oriented to Situation Alcohol / Substance use:  Not Applicable Psych involvement (Current and /or in the community):  No (Comment) (Not appropriate on this admission.)  Discharge Needs  Concerns to be addressed:  No discharge needs identified Readmission within the last 30 days:    Current discharge risk:  None Barriers to Discharge:  No Barriers Identified   Caroline Sauger, LCSW 03/01/2015, 1:00 PM [phone]

## 2015-03-01 NOTE — Discharge Planning (Signed)
Patient to be discharged to [facility]. Patient updated at bedside.  Facility: [facility] RN report number: [phone] Transportation: EMS ([facility])  Patria Warzecha, LCSWA - [phone] Clinical Social Work Department Orthopedics (5N9-32) and Surgical ([id number])   

## 2015-03-01 NOTE — Progress Notes (Signed)
Patient ID: Cynthia Velasquez, female   DOB: 1950-10-06, 64 y.o.   MRN: [id number] PATIENT ID: Cynthia Velasquez  MRN: [id number]  DOB/AGE:  April 23, 1951 / 64 y.o.  2 Days Post-Op Procedure(s) (LRB): TOTAL KNEE ARTHROPLASTY (Right)    PROGRESS NOTE Subjective: Patient is alert, oriented, no Nausea, no Vomiting, yes passing gas, no Bowel Movement. Taking PO well. Denies SOB, Chest or Calf Pain. Using Incentive Spirometer, PAS in place. Ambulate 6ft, CPM 0-60 Patient reports pain as 3 on 0-10 scale  .    Objective: Vital signs in last 24 hours: Filed Vitals:   02/28/15 0517 02/28/15 1400 02/28/15 2056 03/01/15 0518  BP: 118/68 134/65 129/74 89/58  Pulse: 78 74 77 85  Temp: 99.6 F (37.6 C) 99 F (37.2 C)  99.7 F (37.6 C)  TempSrc: Oral     Resp: 15 16 16 18   Height:      Weight:      SpO2: 98% 94% 97% 95%      Intake/Output from previous day: I/O last 3 completed shifts: In: 4405.8 [P.O.:960; I.V.:3445.8] Out: -    Intake/Output this shift:     LABORATORY DATA:  Recent Labs  02/27/15 0805 02/28/15 0535 03/01/15 0543  WBC  --  7.5 10.8*  HGB  --  9.6* 9.6*  HCT  --  30.3* 29.7*  PLT  --  211 221  NA  --  136  --   K  --  3.6  --   CL  --  102  --   CO2  --  28  --   BUN  --  14  --   CREATININE  --  1.04*  --   GLUCOSE  --  123*  --   GLUCAP 98  --   --   CALCIUM  --  7.9*  --     Examination: Neurologically intact ABD soft Neurovascular intact Sensation intact distally Intact pulses distally Dorsiflexion/Plantar flexion intact Incision: dressing C/D/I No cellulitis present Compartment soft}  Assessment:   2 Days Post-Op Procedure(s) (LRB): TOTAL KNEE ARTHROPLASTY (Right) ADDITIONAL DIAGNOSIS: Expected Acute Blood Loss Anemia, neurppathy  Plan: PT/OT WBAT, CPM 5/hrs day until ROM 0-90 degrees, then D/C CPM DVT Prophylaxis:  SCDx72hrs, ASA 325 mg BID x 2 weeks DISCHARGE PLAN: Skilled Nursing Facility/Rehab DISCHARGE NEEDS: HHPT, CPM, Walker and 3-in-1  comode seat     Cynthia Velasquez J 03/01/2015, 8:20 AM

## 2015-03-01 NOTE — Progress Notes (Signed)
Physical Therapy Treatment Patient Details Name: Cynthia Velasquez MRN: [id number] DOB: [birth year] Today's Date: 03/01/2015    History of Present Illness Patient is a 64 y/o female s/p R TKA. PMH includes R THA, breast ca, HTN, HLD, DM, neuropathy, depression.    PT Comments    Patient progressing very slowly with mobility. Requires frequent rest breaks leaning on RW due to pain in shoulders and knee. 1 small LOB during retro gait requiring Min A for support. Pain limiting ambulation distance. Reviewed exercises. Pt slightly lethargic due to being given pain meds prior to session. Will continue to follow to maximize independence. Appropriate for ST SNF.   Follow Up Recommendations  SNF;Supervision/Assistance - 24 hour     Equipment Recommendations  None recommended by PT    Recommendations for Other Services       Precautions / Restrictions Precautions Precautions: Knee;Fall Precaution Booklet Issued: Yes (comment) Precaution Comments: Reviewed no pillow under knee. Restrictions Weight Bearing Restrictions: Yes RLE Weight Bearing: Weight bearing as tolerated    Mobility  Bed Mobility Overal bed mobility: Needs Assistance Bed Mobility: Supine to Sit     Supine to sit: Min assist;HOB elevated     General bed mobility comments: min A to advance RLE with heavy use of rails.  Transfers Overall transfer level: Needs assistance Equipment used: Rolling walker (2 wheeled) Transfers: Sit to/from Stand Sit to Stand: Mod assist         General transfer comment: Mod A to rise from EOB x1, use of grab bars to stand from [facility]. Cues for hand placement.   Ambulation/Gait Ambulation/Gait assistance: Min assist Ambulation Distance (Feet): 20 Feet (x2 bouts) Assistive device: Rolling walker (2 wheeled) Gait Pattern/deviations: Step-to pattern;Decreased stance time - right;Decreased step length - left;Trunk flexed   Gait velocity interpretation: Below normal speed for  age/gender General Gait Details: Slow, unsteady gait with frequent rest breaks with pt leaning on RW due to pain in shoulders. Increased knee flexion through gait despite cues for knee extension during stance phase. 1 LOB with retro gait.   Stairs            Wheelchair Mobility    Modified Rankin (Stroke Patients Only)       Balance Overall balance assessment: Needs assistance Sitting-balance support: Feet supported;No upper extremity supported Sitting balance-Leahy Scale: Good     Standing balance support: During functional activity Standing balance-Leahy Scale: Fair Standing balance comment: Able to perform hand washing at sink with UE support; use of RW for mobility.                    Cognition Arousal/Alertness: Awake/alert Behavior During Therapy: WFL for tasks assessed/performed Overall Cognitive Status: Within Functional Limits for tasks assessed                      Exercises Total Joint Exercises Ankle Circles/Pumps: Both;15 reps;Seated Quad Sets: Right;10 reps;Seated Hip ABduction/ADduction: Right;AAROM;10 reps;Seated Long Arc Quad: AAROM;Right;10 reps;Seated Goniometric ROM: 6-60 degrees knee AROM    General Comments        Pertinent Vitals/Pain Pain Assessment: Faces Faces Pain Scale: Hurts whole lot Pain Location: right knee with movement Pain Descriptors / Indicators: Sore;Aching;Sharp Pain Intervention(s): Limited activity within patient's tolerance;Monitored during session;Repositioned;Premedicated before session    Home Living                      Prior Function  PT Goals (current goals can now be found in the care plan section) Progress towards PT goals: Progressing toward goals    Frequency  7X/week    PT Plan Current plan remains appropriate    Co-evaluation             End of Session Equipment Utilized During Treatment: Gait belt Activity Tolerance: Patient limited by pain Patient  left: in chair;with call bell/phone within reach     Time: 1128-1200 PT Time Calculation (min) (ACUTE ONLY): 32 min  Charges:  $Gait Training: 8-22 mins $Therapeutic Exercise: 8-22 mins                    G Codes:      Ashey Tramontana A Marquitta Persichetti 03/01/2015, 12:07 PM Wray Kearns, [place], DPT [phone]

## 2015-03-01 NOTE — Progress Notes (Signed)
  Pharmacy Discharge Medication Therapy Review   Total Number of meds on admission ____7_____ (polypharmacy > 10 meds)  Indications for all medications: [x]  Yes       []  No  Adherence Review  []  Excellent (no doses missed/week)     [x]  Good (no more than 1 dose missed/week)     []  Partial (2-3 doses missed/week)     []  Poor (>3 doses missed/week)   Total number of high risk medications _0__ (Anticoagulants, Dual antiplatelets, oral Antihyperglycemic agents,Insulins, Antipsychotics, Anti-Seizure meds, Inhalers, HF/ACS meds, Antibiotics and HIV medications)   Assessment: (Medication related problems)  Intervention  YES NO  Explanation   Indications      Medication without noted indication [x]  []  82 YOF admitted for total knee arthoplasty. Patient previously prescribed Z-pak beginning on 5/26 for strept throat.  Patient reports completing entire course and symptoms have resolved.  Will remove from patient discharge med list.     Indication without noted medication []  [x]     Duplicate therapy []  [x]    Efficacy      Suboptimal drug or dose selection []  [x]    Insufficient dose/duration []  [x]    Failure to receive therapy  (Rx not filled) []  [x]     Safety      Adverse drug event []  [x]     Drug interaction []  [x]     Excessive dose/duration []  [x]    High-risk medications []  [x]    Compliance     Underuse []  [x]     Overuse []  [x]    Other pertinent pharmacist counseling []  [x]      Total number of new medications upon discharge: ____3_____  Time:  Time spent preparing for discharge counseling: 5 min. Time spent counseling patient: 5 min.  Additional time spent on discharge (specify): 0 min.   PLAN:  Consider the following at discharge/Recommendations - DC Azithromycin.  Hassie Bruce, Pharm. D. Clinical Pharmacy Resident Pager: [phone] Ph: [phone] 03/01/2015 10:44 AM

## 2015-03-01 NOTE — Discharge Instructions (Signed)
INSTRUCTIONS AFTER JOINT REPLACEMENT   o Remove items at home which could result in a fall. This includes throw rugs or furniture in walking pathways o ICE to the affected joint every three hours while awake for 30 minutes at a time, for at least the first 3-5 days, and then as needed for pain and swelling.  Continue to use ice for pain and swelling. You may notice swelling that will progress down to the foot and ankle.  This is normal after surgery.  Elevate your leg when you are not up walking on it.   o Continue to use the breathing machine you got in the hospital (incentive spirometer) which will help keep your temperature down.  It is common for your temperature to cycle up and down following surgery, especially at night when you are not up moving around and exerting yourself.  The breathing machine keeps your lungs expanded and your temperature down.   DIET:  As you were doing prior to hospitalization, we recommend a well-balanced diet.  DRESSING / WOUND CARE / SHOWERING  May remove dressing for showering starting 03/02/2015 and then reapply  ACTIVITY  o Increase activity slowly as tolerated, but follow the weight bearing instructions below.   o No driving for 6 weeks or until further direction given by your physician.  You cannot drive while taking narcotics.  o No lifting or carrying greater than 10 lbs. until further directed by your surgeon. o Avoid periods of inactivity such as sitting longer than an hour when not asleep. This helps prevent blood clots.  o You may return to work once you are authorized by your doctor.     WEIGHT BEARING   Weight bearing as tolerated with assist device (walker, cane, etc) as directed, use it as long as suggested by your surgeon or therapist, typically at least 4-6 weeks.   EXERCISES  Results after joint replacement surgery are often greatly improved when you follow the exercise, range of motion and muscle strengthening exercises prescribed by  your doctor. Safety measures are also important to protect the joint from further injury. Any time any of these exercises cause you to have increased pain or swelling, decrease what you are doing until you are comfortable again and then slowly increase them. If you have problems or questions, call your caregiver or physical therapist for advice.   Rehabilitation is important following a joint replacement. After just a few days of immobilization, the muscles of the leg can become weakened and shrink (atrophy).  These exercises are designed to build up the tone and strength of the thigh and leg muscles and to improve motion. Often times heat used for twenty to thirty minutes before working out will loosen up your tissues and help with improving the range of motion but do not use heat for the first two weeks following surgery (sometimes heat can increase post-operative swelling).   These exercises can be done on a training (exercise) mat, on the floor, on a table or on a bed. Use whatever works the best and is most comfortable for you.    Use music or television while you are exercising so that the exercises are a pleasant break in your day. This will make your life better with the exercises acting as a break in your routine that you can look forward to.   Perform all exercises about fifteen times, three times per day or as directed.  You should exercise both the operative leg and the other leg  as well.  Exercises include:    Quad Sets - Tighten up the muscle on the front of the thigh (Quad) and hold for 5-10 seconds.    Straight Leg Raises - With your knee straight (if you were given a brace, keep it on), lift the leg to 60 degrees, hold for 3 seconds, and slowly lower the leg.  Perform this exercise against resistance later as your leg gets stronger.   Leg Slides: Lying on your back, slowly slide your foot toward your buttocks, bending your knee up off the floor (only go as far as is comfortable). Then  slowly slide your foot back down until your leg is flat on the floor again.   Angel Wings: Lying on your back spread your legs to the side as far apart as you can without causing discomfort.   Hamstring Strength:  Lying on your back, push your heel against the floor with your leg straight by tightening up the muscles of your buttocks.  Repeat, but this time bend your knee to a comfortable angle, and push your heel against the floor.  You may put a pillow under the heel to make it more comfortable if necessary.   A rehabilitation program following joint replacement surgery can speed recovery and prevent re-injury in the future due to weakened muscles. Contact your doctor or a physical therapist for more information on knee rehabilitation.    CONSTIPATION  Constipation is defined medically as fewer than three stools per week and severe constipation as less than one stool per week.  Even if you have a regular bowel pattern at home, your normal regimen is likely to be disrupted due to multiple reasons following surgery.  Combination of anesthesia, postoperative narcotics, change in appetite and fluid intake all can affect your bowels.   YOU MUST use at least one of the following options; they are listed in order of increasing strength to get the job done.  They are all available over the counter, and you may need to use some, POSSIBLY even all of these options:    Drink plenty of fluids (prune juice may be helpful) and high fiber foods Colace 100 mg by mouth twice a day  Senokot for constipation as directed and as needed Dulcolax (bisacodyl), take with full glass of water  Miralax (polyethylene glycol) once or twice a day as needed.  If you have tried all these things and are unable to have a bowel movement in the first 3-4 days after surgery call either your surgeon or your primary doctor.    If you experience loose stools or diarrhea, hold the medications until you stool forms back up.  If your  symptoms do not get better within 1 week or if they get worse, check with your doctor.  If you experience "the worst abdominal pain ever" or develop nausea or vomiting, please contact the office immediately for further recommendations for treatment.   ITCHING:  If you experience itching with your medications, try taking only a single pain pill, or even half a pain pill at a time.  You can also use Benadryl over the counter for itching or also to help with sleep.   TED HOSE STOCKINGS:  Use stockings on both legs until for at least 2 weeks or as directed by physician office. They may be removed at night for sleeping.  MEDICATIONS:  See your medication summary on the After Visit Summary that nursing will review with you.  You may have some home  medications which will be placed on hold until you complete the course of blood thinner medication.  It is important for you to complete the blood thinner medication as prescribed.  PRECAUTIONS:  If you experience chest pain or shortness of breath - call 911 immediately for transfer to the hospital emergency department.   If you develop a fever greater that 101 F, purulent drainage from wound, increased redness or drainage from wound, foul odor from the wound/dressing, or calf pain - CONTACT YOUR SURGEON.                                                   FOLLOW-UP APPOINTMENTS:  If you do not already have a post-op appointment, please call the office for an appointment to be seen by your surgeon.  Guidelines for how soon to be seen are listed in your After Visit Summary, but are typically between 1-4 weeks after surgery.  OTHER INSTRUCTIONS:   Knee Replacement:  Do not place pillow under knee, focus on keeping the knee straight while resting. CPM instructions: 0-90 degrees, 2 hours in the morning, 2 hours in the afternoon, and 2 hours in the evening. Place foam block, curve side up under heel at all times except when in CPM or when walking.  DO NOT modify,  tear, cut, or change the foam block in any way.  MAKE SURE YOU:   Understand these instructions.   Get help right away if you are not doing well or get worse.    Thank you for letting us be a part of your medical care team.  It is a privilege we respect greatly.  We hope these instructions will help you stay on track for a fast and full recovery!

## 2015-03-02 ENCOUNTER — Encounter: Payer: Self-pay | Admitting: Adult Health

## 2015-03-02 ENCOUNTER — Non-Acute Institutional Stay (SKILLED_NURSING_FACILITY): Payer: BLUE CROSS/BLUE SHIELD | Admitting: Adult Health

## 2015-03-02 DIAGNOSIS — D62 Acute posthemorrhagic anemia: Secondary | ICD-10-CM | POA: Diagnosis not present

## 2015-03-02 DIAGNOSIS — K5900 Constipation, unspecified: Secondary | ICD-10-CM

## 2015-03-02 DIAGNOSIS — E119 Type 2 diabetes mellitus without complications: Secondary | ICD-10-CM | POA: Diagnosis not present

## 2015-03-02 DIAGNOSIS — F32A Depression, unspecified: Secondary | ICD-10-CM

## 2015-03-02 DIAGNOSIS — I10 Essential (primary) hypertension: Secondary | ICD-10-CM | POA: Diagnosis not present

## 2015-03-02 DIAGNOSIS — M1711 Unilateral primary osteoarthritis, right knee: Secondary | ICD-10-CM

## 2015-03-02 DIAGNOSIS — G43809 Other migraine, not intractable, without status migrainosus: Secondary | ICD-10-CM

## 2015-03-02 DIAGNOSIS — F329 Major depressive disorder, single episode, unspecified: Secondary | ICD-10-CM

## 2015-03-02 NOTE — Progress Notes (Signed)
Patient ID: Luke Rigsbee, female   DOB: August 22, 1951, 64 y.o.   MRN: [id number]   03/02/2015  Facility:  [facility]ursing Home Location:  [place] Room Number: 166-0 LEVEL OF CARE:  SNF (31)   Chief Complaint  Patient presents with  . Hospitalization Follow-up    Osteoarthritis S/P right total knee arthroplasty, hypertension, depression, migraine and anemia    HISTORY OF PRESENT ILLNESS:  This is a 64 year old female who was been admitted to [facility] on 03/01/15 from [facility] with osteoarthritis S/P right total knee arthroplasty. She has PMH of breast cancer, hyperlipidemia, hypertension, neuropathy, diabetes mellitus diet-controlled and depression.  She has been admitted for a short-term rehabilitation.  PAST MEDICAL HISTORY:  Past Medical History  Diagnosis Date  . Cancer   . Breast cancer   . Lumbar stenosis   . Hyperlipidemia     takes Simvastatin daily  . Hypertension     takes Hyzaar  . History of migraine   . Arthritis   . Joint pain   . Joint swelling   . GERD (gastroesophageal reflux disease)     doesn't take any meds for this  . History of bladder infections   . Gallstones   . Neuropathy 09/07/2013  . Bilateral breast cancer 10/21/2013  . Headache     h/o migraines      . Depression   . Diabetes mellitus without complication     pre diabetic but takes no meds--diet controlled    CURRENT MEDICATIONS: Reviewed per MAR/see medication list  Allergies  Allergen Reactions  . Oxycodone-Acetaminophen Hives and Itching  . Codeine Itching and Rash  . Vicodin [Hydrocodone-Acetaminophen] Rash     REVIEW OF SYSTEMS:  GENERAL: no change in appetite, no fatigue, no weight changes, no fever, chills or weakness RESPIRATORY: no cough, SOB, DOE, wheezing, hemoptysis CARDIAC: no chest pain, edema or palpitations GI: no abdominal pain, diarrhea, heart burn, nausea or vomiting, +constipation  PHYSICAL EXAMINATION  GENERAL: no  acute distress, obese SKIN:  Right knee surgical wound is covered with dry dressing, no erythema EYES: conjunctivae normal, sclerae normal, normal eye lids NECK: supple, trachea midline, no neck masses, no thyroid tenderness, no thyromegaly LYMPHATICS: no LAN in the neck, no supraclavicular LAN RESPIRATORY: breathing is even & unlabored, BS CTAB CARDIAC: RRR, no murmur,no extra heart sounds, no edema GI: abdomen soft, normal BS, no masses, no tenderness, no hepatomegaly, no splenomegaly EXTREMITIES:   Able to move 4 extremities PSYCHIATRIC: the patient is alert & oriented to person, affect & behavior appropriate  LABS/RADIOLOGY: Labs reviewed: Basic Metabolic Panel:  Recent Labs  11/08/14 0815 02/15/15 1148 02/28/15 0535  NA 141 142 136  K 3.6 3.1* 3.6  CL  --  106 102  CO2 28 26 28   GLUCOSE 102 104* 123*  BUN 16.2 8 14   CREATININE 0.9 0.92 1.04*  CALCIUM 9.0 9.1 7.9*   Liver Function Tests:  Recent Labs  11/08/14 0815  AST 13  ALT 12  ALKPHOS 115  BILITOT 0.37  PROT 6.5  ALBUMIN 3.2*   CBC:  Recent Labs  11/08/14 0815 02/15/15 1148 02/28/15 0535 03/01/15 0543  WBC 9.6 6.4 7.5 10.8*  NEUTROABS 5.1 3.3  --   --   HGB 13.2 13.3 9.6* 9.6*  HCT 41.5 41.7 30.3* 29.7*  MCV 83.0 80.7 82.3 80.9  PLT 266 277 211 221    Recent Labs  02/27/15 0805  GLUCAP 98    Dg Chest  2 View  02/15/2015   CLINICAL DATA:  64 year old female with impending right knee arthroplasty. Preoperative chest x-ray. No current chest complaints.  EXAM: CHEST  2 VIEW  COMPARISON:  Prior chest x-ray 09/02/2014  FINDINGS: Cardiac and mediastinal contours are within normal limits. The lungs are clear. No pulmonary edema, pleural effusion, pneumothorax or focal airspace consolidation. Pulmonary hyperinflation and chronic bronchitic changes are similar compared to prior. Surgical clips present in both axilla. Dextro convex scoliosis of the lumbar spine.  IMPRESSION: No active cardiopulmonary  disease.  Background chronic pulmonary parenchymal changes suggest underlying COPD.  Incompletely imaged dextro convex lumbar scoliosis.   Electronically Signed   By: Jacqulynn Cadet M.D.   On: 02/15/2015 14:52    ASSESSMENT/PLAN:  Osteoarthritis S/P right total knee arthroplasty - for rehabilitation; follow-up with Dr. Mayer Camel, orthopedic surgeon, in 1 week; continue aspirin 325 mg 1 tab by mouth twice a day for DVT prophylaxis; Dilaudid 2 mg 1-2 tabs by mouth every 4 hours when necessary and tramadol 50 mg 1 tab by mouth every 6 hours when necessary for pain; and Robaxin 500 mg 1 tab by mouth 4 times a day for muscle spasm Hypertension - well controlled; continue Hyzaar 50-12.5 mg 1 tab by mouth every morning; BP every shift 1 week Depression - mood is stable; continue Zoloft 50 mg 1 tab by mouth daily Migraine - continue Imitrex 50 mg 1 tab by mouth every 2 hours when necessary Anemia, acute blood loss - hemoglobin 9.6; will monitor Constipation - start senna S2 tabs by mouth twice a day Diabetes mellitus, type II - diet controlled   Goals of care:  Short-term rehabilitation   Labs/test ordered:  CBC, HgbA1C and CMP  Spent 50 minutes in patient care.    [facility], NP [employer] [phone]

## 2015-03-03 ENCOUNTER — Encounter: Payer: Self-pay | Admitting: Internal Medicine

## 2015-03-03 ENCOUNTER — Non-Acute Institutional Stay (SKILLED_NURSING_FACILITY): Payer: BLUE CROSS/BLUE SHIELD | Admitting: Internal Medicine

## 2015-03-03 DIAGNOSIS — I10 Essential (primary) hypertension: Secondary | ICD-10-CM

## 2015-03-03 DIAGNOSIS — K5900 Constipation, unspecified: Secondary | ICD-10-CM

## 2015-03-03 DIAGNOSIS — E119 Type 2 diabetes mellitus without complications: Secondary | ICD-10-CM | POA: Diagnosis not present

## 2015-03-03 DIAGNOSIS — N179 Acute kidney failure, unspecified: Secondary | ICD-10-CM | POA: Diagnosis not present

## 2015-03-03 DIAGNOSIS — D62 Acute posthemorrhagic anemia: Secondary | ICD-10-CM | POA: Diagnosis not present

## 2015-03-03 DIAGNOSIS — M1711 Unilateral primary osteoarthritis, right knee: Secondary | ICD-10-CM

## 2015-03-03 DIAGNOSIS — F322 Major depressive disorder, single episode, severe without psychotic features: Secondary | ICD-10-CM | POA: Diagnosis not present

## 2015-03-03 DIAGNOSIS — F329 Major depressive disorder, single episode, unspecified: Secondary | ICD-10-CM

## 2015-03-03 NOTE — Progress Notes (Signed)
Patient ID: Cynthia Velasquez, female   DOB: July 20, 1951, 64 y.o.   MRN: [id number]      [facility]   PCP: Tawanna Solo, MD  Allergies  Allergen Reactions  . Oxycodone-Acetaminophen Hives and Itching  . Codeine Itching and Rash  . Vicodin [Hydrocodone-Acetaminophen] Rash    Chief Complaint  Patient presents with  . New Admit To SNF    New Admission      HPI:  64 year old patient is here for short term rehabilitation post hospital admission with severe right knee OA. She underwent right total knee arthroplasty. She is seen in her room today, her pain is controlled with current regimen. She has trouble with her bowel movement, last bowel movement 5 days back. Denies any other concerns  Review of Systems:  Constitutional: Negative for fever, chills, diaphoresis.  HENT: Negative for headache, congestion, nasal discharge, hearing loss, earache, sore throat, difficulty swallowing.   Eyes: Negative for eye pain, blurred vision, double vision and discharge.  Respiratory: Negative for cough, shortness of breath and wheezing.   Cardiovascular: Negative for chest pain, palpitations, leg swelling.  Gastrointestinal: Negative for heartburn, nausea, vomiting, abdominal pain Genitourinary: Negative for dysuria Musculoskeletal: Negative for back pain, falls Neurological: Negative for dizziness, tingling, focal weakness Psychiatric/Behavioral: Negative for depression   Past Medical History  Diagnosis Date  . Cancer   . Breast cancer   . Lumbar stenosis   . Hyperlipidemia     takes Simvastatin daily  . Hypertension     takes Hyzaar  . History of migraine   . Arthritis   . Joint pain   . Joint swelling   . GERD (gastroesophageal reflux disease)     doesn't take any meds for this  . History of bladder infections   . Gallstones   . Neuropathy 09/07/2013  . Bilateral breast cancer 10/21/2013  . Headache     h/o migraines      . Depression   .  Diabetes mellitus without complication     pre diabetic but takes no meds--diet controlled   Past Surgical History  Procedure Laterality Date  . Double mastectomy     . Tonsillectomy    . Abdominal hysterectomy    . Hernia repair      umbilical  . Surgery for hiatal hernia    . Cholecystectomy    . Bone spur removed from left foot    . Right knee arthroscopy    . Right shoulder arthroscopy    . Colonoscopy    . Total hip arthroplasty Left 02/12/2013  . Total hip arthroplasty Left 02/12/2013    Procedure: LEFT TOTAL HIP ARTHROPLASTY;  Surgeon: Kerin Salen, MD;  Location: [place];  Service: Orthopedics;  Laterality: Left;  . Total hip arthroplasty Right 05/03/2013    Procedure: TOTAL HIP ARTHROPLASTY;  Surgeon: Kerin Salen, MD;  Location: [place];  Service: Orthopedics;  Laterality: Right;  . Total knee arthroplasty Right 02/27/2015    Procedure: TOTAL KNEE ARTHROPLASTY;  Surgeon: Frederik Pear, MD;  Location: [place];  Service: Orthopedics;  Laterality: Right;   Social History:   reports that she has never smoked. She has never used smokeless tobacco. She reports that she does not drink alcohol or use illicit drugs.  Family History  Problem Relation Age of Onset  . Adopted: Yes    Medications: Patient's Medications  New Prescriptions   No medications on file  Previous Medications   ASPIRIN EC 325  MG TABLET    Take 1 tablet (325 mg total) by mouth 2 (two) times daily.   DIPHENHYDRAMINE (BENADRYL) 25 MG TABLET    Take 25 mg by mouth as needed for itching or allergies.    DOCUSATE SODIUM (COLACE) 100 MG CAPSULE    Take 100 mg by mouth 2 (two) times daily. For constipation   HYDROMORPHONE (DILAUDID) 2 MG TABLET    Take 2 mg by mouth every 4 (four) hours as needed for moderate pain (For moderate pain).   LOSARTAN-HYDROCHLOROTHIAZIDE (HYZAAR) 50-12.5 MG PER TABLET    Take 0.5-1 tablets by mouth 2 (two) times daily. Take one tablet every morning and take half of a tablet every evening    METHOCARBAMOL (ROBAXIN) 500 MG TABLET    Take 1 tablet (500 mg total) by mouth 4 (four) times daily.   PROMETHAZINE (PHENERGAN) 25 MG TABLET    Take 12.5-25 mg by mouth every 6 (six) hours as needed for nausea.   SENNOSIDES-DOCUSATE SODIUM (SENOKOT-S) 8.6-50 MG TABLET    Take 1 tablet by mouth 2 (two) times daily.   SERTRALINE (ZOLOFT) 50 MG TABLET    Take 50 mg by mouth daily.   SUMATRIPTAN (IMITREX) 50 MG TABLET    Take 50 mg by mouth every 2 (two) hours as needed for migraine.   TRAMADOL (ULTRAM) 50 MG TABLET    Take 50 mg by mouth every 6 (six) hours as needed (pain).  Modified Medications   Modified Medication Previous Medication   HYDROMORPHONE (DILAUDID) 4 MG TABLET HYDROmorphone (DILAUDID) 4 MG tablet      Take 1 tablet (4 mg total) by mouth every 4 (four) hours as needed for severe pain (for severe pain).    Take 0.5-1 tablets (2-4 mg total) by mouth every 4 (four) hours as needed for severe pain.  Discontinued Medications   No medications on file     Physical Exam: Filed Vitals:   03/03/15 1428  BP: 124/81  Pulse: 85  Temp: 98.5 F (36.9 C)  TempSrc: Oral  Resp: 16  Height: 5\' 5"  (1.651 m)  Weight: 241 lb 3.2 oz (109.408 kg)  SpO2: 96%    General- elderly female, obese, in no acute distress Head- normocephalic, atraumatic Throat- moist mucus membrane Eyes- PERRLA, EOMI, no pallor, no icterus, no discharge, normal conjunctiva, normal sclera Neck- no cervical lymphadenopathy Cardiovascular- normal s1,s2, no murmurs, palpable dorsalis pedis, right trace leg edema Respiratory- bilateral clear to auscultation, no wheeze, no rhonchi, no crackles, no use of accessory muscles Abdomen- bowel sounds present, soft, non tender Musculoskeletal- able to move all 4 extremities  Neurological- no focal deficit Skin- warm and dry, right knee surgical incision healing well Psychiatry- alert and oriented to person, place and time, normal mood and affect    Labs reviewed: Basic  Metabolic Panel:  Recent Labs  11/08/14 0815 02/15/15 1148 02/28/15 0535  NA 141 142 136  K 3.6 3.1* 3.6  CL  --  106 102  CO2 28 26 28   GLUCOSE 102 104* 123*  BUN 16.2 8 14   CREATININE 0.9 0.92 1.04*  CALCIUM 9.0 9.1 7.9*   Liver Function Tests:  Recent Labs  11/08/14 0815  AST 13  ALT 12  ALKPHOS 115  BILITOT 0.37  PROT 6.5  ALBUMIN 3.2*   No results for input(s): LIPASE, AMYLASE in the last 8760 hours. No results for input(s): AMMONIA in the last 8760 hours. CBC:  Recent Labs  11/08/14 0815 02/15/15 1148 02/28/15 0535 03/01/15  0543  WBC 9.6 6.4 7.5 10.8*  NEUTROABS 5.1 3.3  --   --   HGB 13.2 13.3 9.6* 9.6*  HCT 41.5 41.7 30.3* 29.7*  MCV 83.0 80.7 82.3 80.9  PLT 266 277 211 221   Cardiac Enzymes: No results for input(s): CKTOTAL, CKMB, CKMBINDEX, TROPONINI in the last 8760 hours. BNP: Invalid input(s): POCBNP CBG:  Recent Labs  02/27/15 0805  GLUCAP 98    Assessment/Plan  Right knee Osteoarthritis  S/P right total knee arthroplasty. Has follow up with orthopedics. Will have him work with physical therapy and occupational therapy team to help with gait training and muscle strengthening exercises.fall precautions. Skin care. Encourage to be out of bed. Continue dilaudid 2 mg 1-2 tab q4h prn pain with tramadol 50 mg q6h prn. Continue robaxin 500 mg qid for muscle spasm and aspirin 325 mg bid for dvt prophylaxis  constipation Add miralax 17 g bid and encouraged hydration,monitor. Continue colace and senna s  Blood loss anemia Monitor hb, last 9.6  Hypertension continue Hyzaar 50-12.5 mg daily and monitor bp reading  Acute renal failure Monitor bmp  Depression continue Zoloft 50 mg daily  Dm type 2 diet controlled Off all medication, monitor clinically   Goals of care: short term rehabilitation   Labs/tests ordered: cbc, bmp  Family/ staff Communication: reviewed care plan with patient and nursing supervisor    Blanchie Serve,  MD  [facility] [phone] (Monday-Friday 8 am - 5 pm) [phone] (afterhours)

## 2015-03-07 ENCOUNTER — Encounter: Payer: Self-pay | Admitting: Adult Health

## 2015-03-07 ENCOUNTER — Non-Acute Institutional Stay (SKILLED_NURSING_FACILITY): Payer: BLUE CROSS/BLUE SHIELD | Admitting: Adult Health

## 2015-03-07 DIAGNOSIS — F32A Depression, unspecified: Secondary | ICD-10-CM

## 2015-03-07 DIAGNOSIS — D62 Acute posthemorrhagic anemia: Secondary | ICD-10-CM | POA: Diagnosis not present

## 2015-03-07 DIAGNOSIS — G43809 Other migraine, not intractable, without status migrainosus: Secondary | ICD-10-CM | POA: Diagnosis not present

## 2015-03-07 DIAGNOSIS — K5900 Constipation, unspecified: Secondary | ICD-10-CM

## 2015-03-07 DIAGNOSIS — E119 Type 2 diabetes mellitus without complications: Secondary | ICD-10-CM

## 2015-03-07 DIAGNOSIS — F329 Major depressive disorder, single episode, unspecified: Secondary | ICD-10-CM

## 2015-03-07 DIAGNOSIS — I10 Essential (primary) hypertension: Secondary | ICD-10-CM | POA: Diagnosis not present

## 2015-03-07 DIAGNOSIS — M1711 Unilateral primary osteoarthritis, right knee: Secondary | ICD-10-CM | POA: Diagnosis not present

## 2015-03-07 NOTE — Progress Notes (Signed)
Patient ID: Cynthia Velasquez, female   DOB: 1951/08/01, 64 y.o.   MRN: [id number]   03/07/2015  Facility:  Nursing Home Location:  [place] Room Number: 174-9 LEVEL OF CARE:  SNF (31)   Chief Complaint  Patient presents with  . Discharge Note    Osteoarthritis S/P right total knee arthroplasty, hypertension, depression, migraine, diabetes mellitus, constipation and anemia    HISTORY OF PRESENT ILLNESS:  This is a 64 year old female who is for discharge home with Home health PT and OT. DME: Tub bench . Resident will discharge home with medications and scripts. She has been admitted to [facility] on 03/01/15 from [facility] with osteoarthritis S/P right total knee arthroplasty. She has PMH of breast cancer, hyperlipidemia, hypertension, neuropathy, diabetes mellitus diet-controlled and depression.  Patient was admitted to this facility for short-term rehabilitation after the patient's recent hospitalization.  Patient has completed SNF rehabilitation and therapy has cleared the patient for discharge.  PAST MEDICAL HISTORY:  Past Medical History  Diagnosis Date  . Cancer   . Breast cancer   . Lumbar stenosis   . Hyperlipidemia     takes Simvastatin daily  . Hypertension     takes Hyzaar  . History of migraine   . Arthritis   . Joint pain   . Joint swelling   . GERD (gastroesophageal reflux disease)     doesn't take any meds for this  . History of bladder infections   . Gallstones   . Neuropathy 09/07/2013  . Bilateral breast cancer 10/21/2013  . Headache     h/o migraines      . Depression   . Diabetes mellitus without complication     pre diabetic but takes no meds--diet controlled    CURRENT MEDICATIONS: Reviewed per MAR/see medication list  Allergies  Allergen Reactions  . Oxycodone-Acetaminophen Hives and Itching  . Codeine Itching and Rash  . Vicodin [Hydrocodone-Acetaminophen] Rash     REVIEW OF SYSTEMS:  GENERAL: no  change in appetite, no fatigue, no weight changes, no fever, chills or weakness RESPIRATORY: no cough, SOB, DOE, wheezing, hemoptysis CARDIAC: no chest pain, edema or palpitations GI: no abdominal pain, diarrhea, heart burn, nausea or vomiting PHYSICAL EXAMINATION  GENERAL: no acute distress, obese SKIN:  Right knee surgical wound is covered with dry dressing, no erythema NECK: supple, trachea midline, no neck masses, no thyroid tenderness, no thyromegaly LYMPHATICS: no LAN in the neck, no supraclavicular LAN RESPIRATORY: breathing is even & unlabored, BS CTAB CARDIAC: RRR, no murmur,no extra heart sounds, no edema GI: abdomen soft, normal BS, no masses, no tenderness, no hepatomegaly, no splenomegaly EXTREMITIES:   Able to move 4 extremities PSYCHIATRIC: the patient is alert & oriented to person, affect & behavior appropriate  LABS/RADIOLOGY: Labs reviewed: 03/03/15  WBC 8.0 hemoglobin 9.2 hematocrit 27.6 MCV 80.2 platelet 270 sodium 141 potassium 4.7 glucose 100 BUN 13 creatinine 0.82 total bilirubin 0.5 alkaline phosphatase 63 SGOT 12 SGPT 8 total protein 5.3 of human 2.7 calcium 8.3 hemoglobin A1c 6.1 Basic Metabolic Panel:  Recent Labs  11/08/14 0815 02/15/15 1148 02/28/15 0535  NA 141 142 136  K 3.6 3.1* 3.6  CL  --  106 102  CO2 28 26 28   GLUCOSE 102 104* 123*  BUN 16.2 8 14   CREATININE 0.9 0.92 1.04*  CALCIUM 9.0 9.1 7.9*   Liver Function Tests:  Recent Labs  11/08/14 0815  AST 13  ALT 12  ALKPHOS 115  BILITOT 0.37  PROT 6.5  ALBUMIN 3.2*   CBC:  Recent Labs  11/08/14 0815 02/15/15 1148 02/28/15 0535 03/01/15 0543  WBC 9.6 6.4 7.5 10.8*  NEUTROABS 5.1 3.3  --   --   HGB 13.2 13.3 9.6* 9.6*  HCT 41.5 41.7 30.3* 29.7*  MCV 83.0 80.7 82.3 80.9  PLT 266 277 211 221    Recent Labs  02/27/15 0805  GLUCAP 34    Dg Chest 2 View  02/15/2015   CLINICAL DATA:  64 year old female with impending right knee arthroplasty. Preoperative chest x-ray. No  current chest complaints.  EXAM: CHEST  2 VIEW  COMPARISON:  Prior chest x-ray 09/02/2014  FINDINGS: Cardiac and mediastinal contours are within normal limits. The lungs are clear. No pulmonary edema, pleural effusion, pneumothorax or focal airspace consolidation. Pulmonary hyperinflation and chronic bronchitic changes are similar compared to prior. Surgical clips present in both axilla. Dextro convex scoliosis of the lumbar spine.  IMPRESSION: No active cardiopulmonary disease.  Background chronic pulmonary parenchymal changes suggest underlying COPD.  Incompletely imaged dextro convex lumbar scoliosis.   Electronically Signed   By: Jacqulynn Cadet M.D.   On: 02/15/2015 14:52    ASSESSMENT/PLAN:  Osteoarthritis S/P right total knee arthroplasty - for home health PT and OT; follow-up with Dr. Mayer Camel, orthopedic surgeon; continue aspirin 325 mg 1 tab by mouth twice a day for DVT prophylaxis; Dilaudid 2 mg 1-2 tabs by mouth every 4 hours when necessary and tramadol 50 mg 1 tab by mouth every 6 hours when necessary for pain; and cahnge Robaxin 500 mg 1 tab by mouth Q 6 hours PRN for muscle spasm Hypertension - well controlled; continue Hyzaar 50-12.5 mg 1 tab by mouth every morning; BP every shift 1 week Depression - mood is stable; continue Zoloft 50 mg 1 tab by mouth daily Migraine - stable Anemia, acute blood loss - hemoglobin 9.2; stable Constipation - continue senna S2 tabs by mouth twice a day and MiraLAX 17 g by mouth twice a day Diabetes mellitus, type II - hemoglobin A1c 6.1; diet controlled   I have filled out patient's discharge paperwork and written prescriptions.  Patient will receive home health PT and OT.  DME provided:  Tub bench  Total discharge time: Greater than 30 minutes  Discharge time involved coordination of the discharge process with social worker, nursing staff and therapy department. Medical justification for home health services/DME verified.       [facility], NP [employer] [phone]

## 2015-12-20 ENCOUNTER — Encounter (HOSPITAL_COMMUNITY): Payer: Self-pay

## 2015-12-20 ENCOUNTER — Emergency Department (HOSPITAL_COMMUNITY)
Admission: EM | Admit: 2015-12-20 | Discharge: 2015-12-20 | Disposition: A | Discharge status: Home / Self Care | Payer: BLUE CROSS/BLUE SHIELD | Attending: Emergency Medicine | Admitting: Emergency Medicine

## 2015-12-20 DIAGNOSIS — K219 Gastro-esophageal reflux disease without esophagitis: Secondary | ICD-10-CM | POA: Diagnosis not present

## 2015-12-20 DIAGNOSIS — F329 Major depressive disorder, single episode, unspecified: Secondary | ICD-10-CM | POA: Diagnosis not present

## 2015-12-20 DIAGNOSIS — E785 Hyperlipidemia, unspecified: Secondary | ICD-10-CM | POA: Diagnosis not present

## 2015-12-20 DIAGNOSIS — Z8669 Personal history of other diseases of the nervous system and sense organs: Secondary | ICD-10-CM | POA: Diagnosis not present

## 2015-12-20 DIAGNOSIS — G43109 Migraine with aura, not intractable, without status migrainosus: Secondary | ICD-10-CM | POA: Diagnosis not present

## 2015-12-20 DIAGNOSIS — Z79899 Other long term (current) drug therapy: Secondary | ICD-10-CM | POA: Insufficient documentation

## 2015-12-20 DIAGNOSIS — Z853 Personal history of malignant neoplasm of breast: Secondary | ICD-10-CM | POA: Diagnosis not present

## 2015-12-20 DIAGNOSIS — M1990 Unspecified osteoarthritis, unspecified site: Secondary | ICD-10-CM | POA: Insufficient documentation

## 2015-12-20 DIAGNOSIS — Z87440 Personal history of urinary (tract) infections: Secondary | ICD-10-CM | POA: Insufficient documentation

## 2015-12-20 DIAGNOSIS — R51 Headache: Secondary | ICD-10-CM | POA: Diagnosis present

## 2015-12-20 DIAGNOSIS — Z7982 Long term (current) use of aspirin: Secondary | ICD-10-CM | POA: Diagnosis not present

## 2015-12-20 DIAGNOSIS — I10 Essential (primary) hypertension: Secondary | ICD-10-CM | POA: Insufficient documentation

## 2015-12-20 DIAGNOSIS — E119 Type 2 diabetes mellitus without complications: Secondary | ICD-10-CM | POA: Insufficient documentation

## 2015-12-20 MED ORDER — PROCHLORPERAZINE EDISYLATE 5 MG/ML IJ SOLN
10.0000 mg | Freq: Once | INTRAMUSCULAR | Status: AC
  Administered 2015-12-20: 10 mg via INTRAVENOUS
  Filled 2015-12-20: qty 2

## 2015-12-20 NOTE — Discharge Instructions (Signed)
Migraine Headache Your blood pressures in the emergency department today were 151/112 and 141/88. Get your blood pressure rechecked at Dr. Ammie Ferrier office within the next 2 weeks and take your blood pressure cuff with you to make sure that it is measuring her blood pressure properly. A migraine headache is an intense, throbbing pain on one or both sides of your head. A migraine can last for 30 minutes to several hours. CAUSES  The exact cause of a migraine headache is not always known. However, a migraine may be caused when nerves in the brain become irritated and release chemicals that cause inflammation. This causes pain. Certain things may also trigger migraines, such as:  Alcohol.  Smoking.  Stress.  Menstruation.  Aged cheeses.  Foods or drinks that contain nitrates, glutamate, aspartame, or tyramine.  Lack of sleep.  Chocolate.  Caffeine.  Hunger.  Physical exertion.  Fatigue.  Medicines used to treat chest pain (nitroglycerine), birth control pills, estrogen, and some blood pressure medicines. SIGNS AND SYMPTOMS  Pain on one or both sides of your head.  Pulsating or throbbing pain.  Severe pain that prevents daily activities.  Pain that is aggravated by any physical activity.  Nausea, vomiting, or both.  Dizziness.  Pain with exposure to bright lights, loud noises, or activity.  General sensitivity to bright lights, loud noises, or smells. Before you get a migraine, you may get warning signs that a migraine is coming (aura). An aura may include:  Seeing flashing lights.  Seeing bright spots, halos, or zigzag lines.  Having tunnel vision or blurred vision.  Having feelings of numbness or tingling.  Having trouble talking.  Having muscle weakness. DIAGNOSIS  A migraine headache is often diagnosed based on:  Symptoms.  Physical exam.  A CT scan or MRI of your head. These imaging tests cannot diagnose migraines, but they can help rule out other  causes of headaches. TREATMENT Medicines may be given for pain and nausea. Medicines can also be given to help prevent recurrent migraines.  HOME CARE INSTRUCTIONS  Only take over-the-counter or prescription medicines for pain or discomfort as directed by your health care provider. The use of long-term narcotics is not recommended.  Lie down in a dark, quiet room when you have a migraine.  Keep a journal to find out what may trigger your migraine headaches. For example, write down:  What you eat and drink.  How much sleep you get.  Any change to your diet or medicines.  Limit alcohol consumption.  Quit smoking if you smoke.  Get 7-9 hours of sleep, or as recommended by your health care provider.  Limit stress.  Keep lights dim if bright lights bother you and make your migraines worse. SEEK IMMEDIATE MEDICAL CARE IF:   Your migraine becomes severe.  You have a fever.  You have a stiff neck.  You have vision loss.  You have muscular weakness or loss of muscle control.  You start losing your balance or have trouble walking.  You feel faint or pass out.  You have severe symptoms that are different from your first symptoms. MAKE SURE YOU:   Understand these instructions.  Will watch your condition.  Will get help right away if you are not doing well or get worse.   This information is not intended to replace advice given to you by your health care provider. Make sure you discuss any questions you have with your health care provider.   Document Released: 09/02/2005 Document Revised: 09/23/2014  Document Reviewed: 05/10/2013 Elsevier Interactive Patient Education [employer].

## 2015-12-20 NOTE — ED Notes (Signed)
Pt complains of high blood pressure since yesterday, she received a hydrocortisone injection for knee pain on the 4th and she started getting a headache about 3 and then she started to trend her blood pressure numbers

## 2015-12-20 NOTE — ED Provider Notes (Addendum)
CSN: [id number]     Arrival date & time 12/20/15  [id number] History   First MD Initiated Contact with Patient 12/20/15 [phone]     Chief Complaint  Patient presents with  . Hypertension   Chief Complaint migraine headache  (Consider location/radiation/quality/duration/timing/severity/associated sxs/prior Treatment) HPI Patient developed a migraine headache 2 AM today progressively worsening typical of her migraines that she's had starting at age 65. She gets migraines approximately once every 3 months. Headache is described as left-sided, throbbing, preceded by auraof "seeing bubbles", accompanied by nausea and vomiting. She treated herself with Excedrin at 3 AM, without relief. She is concerned because she took her blood pressure at onset of migraine which was 171/112 and again at 5:50 AM which was 182/115. She's been compliant with her medications. No other symptoms. Patient reports she had her lumbar spine injected yesterday for chronic low back pain which radiates to right knee. Her back and knee feel fine at present. Headache is exacerbated by light and loud noises not improved by anything. No other associated symptoms Past Medical History  Diagnosis Date  . Cancer ([place])   . Breast cancer ([place])   . Lumbar stenosis   . Hyperlipidemia     takes Simvastatin daily  . Hypertension     takes Hyzaar  . History of migraine   . Arthritis   . Joint pain   . Joint swelling   . GERD (gastroesophageal reflux disease)     doesn't take any meds for this  . History of bladder infections   . Gallstones   . Neuropathy ([place]) 09/07/2013  . Bilateral breast cancer ([place]) 10/21/2013  . Headache     h/o migraines      . Depression   . Diabetes mellitus without complication ([place])     pre diabetic but takes no meds--diet controlled   Past Surgical History  Procedure Laterality Date  . Double mastectomy     . Tonsillectomy    . Abdominal hysterectomy    . Hernia repair      umbilical  . Surgery for hiatal  hernia    . Cholecystectomy    . Bone spur removed from left foot    . Right knee arthroscopy    . Right shoulder arthroscopy    . Colonoscopy    . Total hip arthroplasty Left 02/12/2013  . Total hip arthroplasty Left 02/12/2013    Procedure: LEFT TOTAL HIP ARTHROPLASTY;  Surgeon: Kerin Salen, MD;  Location: [place];  Service: Orthopedics;  Laterality: Left;  . Total hip arthroplasty Right 05/03/2013    Procedure: TOTAL HIP ARTHROPLASTY;  Surgeon: Kerin Salen, MD;  Location: [place];  Service: Orthopedics;  Laterality: Right;  . Total knee arthroplasty Right 02/27/2015    Procedure: TOTAL KNEE ARTHROPLASTY;  Surgeon: Frederik Pear, MD;  Location: [place];  Service: Orthopedics;  Laterality: Right;   Family History  Problem Relation Age of Onset  . Adopted: Yes   Social History  Substance Use Topics  . Smoking status: Never Smoker   . Smokeless tobacco: Never Used  . Alcohol Use: No     Comment: rare   OB History    No data available     Review of Systems  Constitutional: Negative.   HENT:       Hyperacusis  Eyes: Positive for photophobia.  Respiratory: Negative.   Cardiovascular: Negative.   Gastrointestinal: Positive for nausea and vomiting.  Musculoskeletal: Negative.   Skin: Negative.   Neurological: Positive for  headaches.  Psychiatric/Behavioral: Negative.   All other systems reviewed and are negative.     Allergies  Oxycodone-acetaminophen; Codeine; and Vicodin  Home Medications   Prior to Admission medications   Medication Sig Start Date End Date Taking? Authorizing Provider  aspirin EC 325 MG tablet Take 1 tablet (325 mg total) by mouth 2 (two) times daily. 02/27/15   Frederik Pear, MD  diphenhydrAMINE (BENADRYL) 25 MG tablet Take 25 mg by mouth as needed for itching or allergies.     Historical Provider, MD  docusate sodium (COLACE) 100 MG capsule Take 100 mg by mouth 2 (two) times daily. For constipation    Historical Provider, MD  HYDROmorphone (DILAUDID) 2 MG  tablet Take 2 mg by mouth every 4 (four) hours as needed for moderate pain (For moderate pain).    Historical Provider, MD  HYDROmorphone (DILAUDID) 4 MG tablet Take 1 tablet (4 mg total) by mouth every 4 (four) hours as needed for severe pain (for severe pain). 03/03/15   Blanchie Serve, MD  losartan-hydrochlorothiazide (HYZAAR) 50-12.5 MG per tablet Take 0.5-1 tablets by mouth 2 (two) times daily. Take one tablet every morning and take half of a tablet every evening    Historical Provider, MD  methocarbamol (ROBAXIN) 500 MG tablet Take 1 tablet (500 mg total) by mouth 4 (four) times daily. 02/27/15   Frederik Pear, MD  promethazine (PHENERGAN) 25 MG tablet Take 12.5-25 mg by mouth every 6 (six) hours as needed for nausea.    Historical Provider, MD  sennosides-docusate sodium (SENOKOT-S) 8.6-50 MG tablet Take 1 tablet by mouth 2 (two) times daily.    Historical Provider, MD  sertraline (ZOLOFT) 50 MG tablet Take 50 mg by mouth daily.    Historical Provider, MD  SUMAtriptan (IMITREX) 50 MG tablet Take 50 mg by mouth every 2 (two) hours as needed for migraine.    Historical Provider, MD  traMADol (ULTRAM) 50 MG tablet Take 50 mg by mouth every 6 (six) hours as needed (pain).    Historical Provider, MD   BP 154/112 mmHg  Pulse 94  Temp(Src) 98.3 F (36.8 C) (Oral)  Resp 18  Ht 5\' 5"  (1.651 m)  Wt 210 lb (95.255 kg)  BMI 34.95 kg/m2  SpO2 96% Physical Exam  Constitutional: She is oriented to person, place, and time. She appears well-developed and well-nourished. She appears distressed.  Alert Glasgow Coma Score 15 appears mildly uncomfortable  HENT:  Head: Normocephalic and atraumatic.  Eyes: Conjunctivae are normal. Pupils are equal, round, and reactive to light.  Positive photophobia  Neck: Neck supple. No tracheal deviation present. No thyromegaly present.  Cardiovascular: Normal rate and regular rhythm.   No murmur heard. Pulmonary/Chest: Effort normal and breath sounds normal.  Abdominal:  Soft. Bowel sounds are normal. She exhibits no distension. There is no tenderness.  Obese  Musculoskeletal: Normal range of motion. She exhibits no edema or tenderness.  Neurological: She is alert and oriented to person, place, and time. Coordination normal.  Gait normal Romberg normal pronator drift normal finger to nose normal DTRs symmetric bilaterally at knee jerk ankle jerk biceps toes downward going bilaterally  Skin: Skin is warm and dry. No rash noted.  Psychiatric: She has a normal mood and affect.  Nursing note and vitals reviewed.   ED Course  Procedures (including critical care time) Labs Review Labs Reviewed  BASIC METABOLIC PANEL  CBC WITH DIFFERENTIAL/PLATELET    Imaging Review No results found. I have personally reviewed and evaluated these  images and lab results as part of my medical decision-making.   EKG Interpretation None     8:05 AM headache is much improved after treatment with intravenous Compazine. She is alert ambulatory Glasgow Coma Score 15 and feels ready to go home MDM  I suggested to patient that she get her blood pressure rechecked at her PMDs office within the next 1 or 2 weeks to make sure that it is properly calibrated Diagnosis migraine headache with aura Final diagnoses:  None        Orlie Dakin, MD 12/20/15 [id number]  Orlie Dakin, MD 12/20/15 [id number]

## 2016-04-17 ENCOUNTER — Other Ambulatory Visit: Payer: Self-pay | Admitting: Family Medicine

## 2016-04-17 DIAGNOSIS — R413 Other amnesia: Secondary | ICD-10-CM

## 2016-04-23 ENCOUNTER — Ambulatory Visit
Admission: RE | Admit: 2016-04-23 | Discharge: 2016-04-23 | Disposition: A | Discharge status: Home / Self Care | Payer: BLUE CROSS/BLUE SHIELD | Source: Ambulatory Visit | Attending: Family Medicine | Admitting: Family Medicine

## 2016-04-23 DIAGNOSIS — R413 Other amnesia: Secondary | ICD-10-CM

## 2016-08-12 ENCOUNTER — Other Ambulatory Visit: Payer: Self-pay | Admitting: Orthopedic Surgery

## 2016-08-14 ENCOUNTER — Other Ambulatory Visit: Payer: Self-pay | Admitting: Orthopedic Surgery

## 2016-08-14 DIAGNOSIS — M25511 Pain in right shoulder: Secondary | ICD-10-CM

## 2016-08-27 ENCOUNTER — Other Ambulatory Visit: Payer: BLUE CROSS/BLUE SHIELD

## 2016-09-03 ENCOUNTER — Ambulatory Visit
Admission: RE | Admit: 2016-09-03 | Discharge: 2016-09-03 | Disposition: A | Discharge status: Home / Self Care | Payer: BLUE CROSS/BLUE SHIELD | Source: Ambulatory Visit | Attending: Orthopedic Surgery | Admitting: Orthopedic Surgery

## 2016-09-03 DIAGNOSIS — M19011 Primary osteoarthritis, right shoulder: Secondary | ICD-10-CM | POA: Diagnosis not present

## 2016-09-03 DIAGNOSIS — M25511 Pain in right shoulder: Secondary | ICD-10-CM

## 2016-09-04 ENCOUNTER — Other Ambulatory Visit (HOSPITAL_COMMUNITY): Payer: Self-pay | Admitting: *Deleted

## 2016-09-04 ENCOUNTER — Encounter (HOSPITAL_COMMUNITY): Payer: Self-pay

## 2016-09-04 NOTE — Pre-Procedure Instructions (Signed)
Zaleah Huetter  09/04/2016    Your procedure is scheduled on Thursday, September 12, 2016 at 7:30 AM.   Report to [facility] Entrance "A" Admitting Office at 5:30 AM.   Call this number if you have problems the morning of surgery: [phone]   Questions prior to day of surgery, please call [phone] between 8 & 4 PM.   Remember:  Do not eat food or drink liquids after midnight Wednesday, 09/11/16.  Take these medicines the morning of surgery with A SIP OF WATER: Metoprolol (Toprol), Omeprazole (Prilosec)  Don't use Aspirin products (Excedrin Migraine, etc) 7 days prior to surgery.   Do not wear jewelry, make-up or nail polish.  Do not wear lotions, powders, perfumes, or deodorant.  Do not shave 48 hours prior to surgery.    Do not bring valuables to the hospital.  [facility] is not responsible for any belongings or valuables.  Contacts, dentures or bridgework may not be worn into surgery.  Leave your suitcase in the car.  After surgery it may be brought to your room.  For patients admitted to the hospital, discharge time will be determined by your treatment team.  Special instructions:  [place] - Preparing for Surgery  Before surgery, you can play an important role.  Because skin is not sterile, your skin needs to be as free of germs as possible.  You can reduce the number of germs on you skin by washing with CHG (chlorahexidine gluconate) soap before surgery.  CHG is an antiseptic cleaner which kills germs and bonds with the skin to continue killing germs even after washing.  Please DO NOT use if you have an allergy to CHG or antibacterial soaps.  If your skin becomes reddened/irritated stop using the CHG and inform your nurse when you arrive at Short Stay.  Do not shave (including legs and underarms) for at least 48 hours prior to the first CHG shower.  You may shave your face.  Please follow these instructions carefully:   1.  Shower with CHG Soap the  night before surgery and the                    morning of Surgery.  2.  If you choose to wash your hair, wash your hair first as usual with your       normal shampoo.  3.  After you shampoo, rinse your hair and body thoroughly to remove the shampoo.  4.  Use CHG as you would any other liquid soap.  You can apply chg directly       to the skin and wash gently with scrungie or a clean washcloth.  5.  Apply the CHG Soap to your body ONLY FROM THE NECK DOWN.        Do not use on open wounds or open sores.  Avoid contact with your eyes, ears, mouth and genitals (private parts).  Wash genitals (private parts) with your normal soap.  6.  Wash thoroughly, paying special attention to the area where your surgery        will be performed.  7.  Thoroughly rinse your body with warm water from the neck down.  8.  DO NOT shower/wash with your normal soap after using and rinsing off       the CHG Soap.  9.  Pat yourself dry with a clean towel.            10.  Wear clean  pajamas.            11.  Place clean sheets on your bed the night of your first shower and do not        sleep with pets.  Day of Surgery  Do not apply any lotions/deodorants the morning of surgery.  Please wear clean clothes to the hospital.   Please read over the fact sheets that you were given.

## 2016-09-05 ENCOUNTER — Encounter (HOSPITAL_COMMUNITY): Payer: Self-pay

## 2016-09-05 ENCOUNTER — Ambulatory Visit (HOSPITAL_COMMUNITY)
Admission: RE | Admit: 2016-09-05 | Discharge: 2016-09-05 | Disposition: A | Discharge status: Home / Self Care | Payer: PPO | Source: Ambulatory Visit | Attending: Orthopedic Surgery | Admitting: Orthopedic Surgery

## 2016-09-05 ENCOUNTER — Encounter (HOSPITAL_COMMUNITY)
Admission: RE | Admit: 2016-09-05 | Discharge: 2016-09-05 | Disposition: A | Discharge status: Home / Self Care | Payer: PPO | Source: Ambulatory Visit | Attending: Orthopedic Surgery | Admitting: Orthopedic Surgery

## 2016-09-05 DIAGNOSIS — Z01810 Encounter for preprocedural cardiovascular examination: Secondary | ICD-10-CM | POA: Insufficient documentation

## 2016-09-05 DIAGNOSIS — Z01818 Encounter for other preprocedural examination: Secondary | ICD-10-CM | POA: Diagnosis not present

## 2016-09-05 DIAGNOSIS — Z01812 Encounter for preprocedural laboratory examination: Secondary | ICD-10-CM | POA: Diagnosis not present

## 2016-09-05 DIAGNOSIS — I700 Atherosclerosis of aorta: Secondary | ICD-10-CM | POA: Insufficient documentation

## 2016-09-05 HISTORY — DX: Prediabetes: R73.03

## 2016-09-05 HISTORY — DX: Pneumonia, unspecified organism: J18.9

## 2016-09-05 HISTORY — DX: Personal history of other diseases of the digestive system: Z87.19

## 2016-09-05 HISTORY — DX: Anxiety disorder, unspecified: F41.9

## 2016-09-05 LAB — COMPREHENSIVE METABOLIC PANEL
ALT: 14 U/L (ref 14–54)
AST: 20 U/L (ref 15–41)
Albumin: 3.8 g/dL (ref 3.5–5.0)
Alkaline Phosphatase: 96 U/L (ref 38–126)
Anion gap: 5 (ref 5–15)
BILIRUBIN TOTAL: 0.6 mg/dL (ref 0.3–1.2)
BUN: 10 mg/dL (ref 6–20)
CHLORIDE: 108 mmol/L (ref 101–111)
CO2: 25 mmol/L (ref 22–32)
CREATININE: 0.93 mg/dL (ref 0.44–1.00)
Calcium: 9.2 mg/dL (ref 8.9–10.3)
GFR calc Af Amer: >60 mL/min (ref >60)
Glucose, Bld: 104 mg/dL — ABNORMAL HIGH (ref 65–99)
Potassium: 3.3 mmol/L — ABNORMAL LOW (ref 3.5–5.1)
Sodium: 138 mmol/L (ref 135–145)
TOTAL PROTEIN: 7.1 g/dL (ref 6.5–8.1)

## 2016-09-05 LAB — CBC WITH DIFFERENTIAL/PLATELET
BASOS ABS: 0 10*3/uL (ref 0.0–0.1)
Basophils Relative: 0 %
Eosinophils Absolute: 0.1 10*3/uL (ref 0.0–0.7)
Eosinophils Relative: 2 %
HEMATOCRIT: 40.4 % (ref 36.0–46.0)
HEMOGLOBIN: 13.2 g/dL (ref 12.0–15.0)
LYMPHS PCT: 33 %
Lymphs Abs: 2.5 10*3/uL (ref 0.7–4.0)
MCH: 26.4 pg (ref 26.0–34.0)
MCHC: 32.7 g/dL (ref 30.0–36.0)
MCV: 80.8 fL (ref 78.0–100.0)
Monocytes Absolute: 0.5 10*3/uL (ref 0.1–1.0)
Monocytes Relative: 6 %
NEUTROS ABS: 4.5 10*3/uL (ref 1.7–7.7)
Neutrophils Relative %: 59 %
Platelets: 230 10*3/uL (ref 150–400)
RBC: 5 MIL/uL (ref 3.87–5.11)
RDW: 14.5 % (ref 11.5–15.5)
WBC: 7.6 10*3/uL (ref 4.0–10.5)

## 2016-09-05 LAB — TYPE AND SCREEN
ABO/RH(D): O POS
Antibody Screen: NEGATIVE

## 2016-09-05 LAB — URINALYSIS, ROUTINE W REFLEX MICROSCOPIC
Bilirubin Urine: NEGATIVE
Glucose, UA: NEGATIVE mg/dL
KETONES UR: NEGATIVE mg/dL
NITRITE: POSITIVE — AB
PH: 5 (ref 5.0–8.0)
Protein, ur: NEGATIVE mg/dL
Specific Gravity, Urine: 1.02 (ref 1.005–1.030)

## 2016-09-05 LAB — PROTIME-INR
INR: 1.05
PROTHROMBIN TIME: 13.7 s (ref 11.4–15.2)

## 2016-09-05 LAB — SURGICAL PCR SCREEN
MRSA, PCR: POSITIVE — AB
STAPHYLOCOCCUS AUREUS: POSITIVE — AB

## 2016-09-05 LAB — APTT: APTT: 32 s (ref 24–36)

## 2016-09-05 NOTE — Progress Notes (Signed)
Pt denies cardiac history, but states she has seen Dr. Woody Seller for chest pain back in September, 2017 and had a stress test and Echo done. She states they were normal and he thought it was all stress related. He did prescribe her NTG. She states she had chest pain about 3 weeks ago after she and her daughter had an argument. She states she tried to rest to get the pain to go away, but it didn't so she took 1 NTG and she got relief, also got a headache. States she has not had any chest pain since. Will request Echo and Stress results and last office visit note from Dr. Woody Seller' office.

## 2016-09-05 NOTE — Progress Notes (Signed)
Mupirocin Ointment Rx called into [facility] on [address] for positive PCR of MRSA and Staph. Pt notified and voiced understanding.

## 2016-09-06 NOTE — Progress Notes (Signed)
Anesthesia chart review: Patient is a 65 year old female scheduled for right total shoulder arthroplasty on 09/12/2016 by Dr. Tamera Punt.  History includes nonsmoker, breast cancer s/p bilateral mastectomy, hyperlipidemia, prediabetes, migraines, hypertension, GERD, hiatal hernia s/p repair, tonsillectomy, depression, anxiety, lumbar stenosis, neuropathy, right TKA, 02/27/15, bilateral THA '14, cholecystectomy. BMI is consistent with obesity.  PCP is listed as Dr. Kathyrn Lass. Patient was also evaluated by cardiologist Dr. Vear Clock at [facility] on 05/27/16 and 06/24/16 for evaluation of chest pain and had a low risk stress test. (Reported one recurrent episode of chest pain and Nitro use 3 weeks ago after an argument with her daughter. No new chest pain since.)  Meds include Excedrin Migraine, Benadryl, Hyzaar, Toprol-XL, nitroglycerin, Prilosec, Crestor.  BP (!) 145/86   Pulse 73   Temp 36.9 C   Resp 20   Ht 5' 5.25" (1.657 m)   Wt 235 lb (106.6 kg)   SpO2 99%   BMI 38.81 kg/m   EKG 09/05/16: NSR.  Nuclear stress test 06/03/16 [facility] CV): 1. The resting electrocardiogram demonstrated normal sinus rhythm, normal resting conduction, no resting arrhythmias and normal rest repolarization. Stress EKG is nondiagnostic for ischemia as it is a pharmacologic stress using Lexiscan. 2. Perfusion imaging study demonstrating soft tissue attenuation artifact in the inferior wall without demonstratable ischemia or scar. Dynamic gated images reveal normal wall motion in all vascular territories, LVEF normal at 72%. This is a low risk scan.  No echo at [facility].  CXR 09/05/16: IMPRESSION: No acute disease. Atherosclerosis.  Preoperative labs noted. K 3.3. Cr 0.93. CBC WNL. PT/PTT WNL. T&S. UA showed many bacteria, positive leukocytes and nitrites. (Voice message left for Carla at Dr. Bettina Gavia office regarding UA results that appear consistent with UTI; Defer treatment  recommendations to Dr. Tamera Punt.)  Patient with recent low risk stress test. EKG repeated at PAT which was normal. If no new CV symptoms then I anticipate that she can proceed as planned from an anesthesia standpoint. Reviewed with anesthesiologist Dr. Marcie Bal.  George Hugh [facility] Short Stay Center/Anesthesiology Phone [phone] 09/06/2016 12:51 PM

## 2016-09-10 ENCOUNTER — Other Ambulatory Visit: Payer: Self-pay | Admitting: Orthopedic Surgery

## 2016-09-12 ENCOUNTER — Encounter (HOSPITAL_COMMUNITY): Payer: Self-pay | Admitting: *Deleted

## 2016-09-12 ENCOUNTER — Encounter (HOSPITAL_COMMUNITY)
Admission: RE | Disposition: A | Discharge status: Home / Self Care | Payer: Self-pay | Source: Ambulatory Visit | Attending: Orthopedic Surgery

## 2016-09-12 ENCOUNTER — Inpatient Hospital Stay (HOSPITAL_COMMUNITY): Payer: PPO | Admitting: Vascular Surgery

## 2016-09-12 ENCOUNTER — Inpatient Hospital Stay (HOSPITAL_COMMUNITY)
Admission: RE | Admit: 2016-09-12 | Discharge: 2016-09-13 | DRG: 483 | Disposition: A | Discharge status: Home / Self Care | Payer: PPO | Source: Ambulatory Visit | Attending: Orthopedic Surgery | Admitting: Orthopedic Surgery

## 2016-09-12 ENCOUNTER — Inpatient Hospital Stay (HOSPITAL_COMMUNITY): Payer: PPO

## 2016-09-12 ENCOUNTER — Inpatient Hospital Stay (HOSPITAL_COMMUNITY): Payer: PPO | Admitting: Anesthesiology

## 2016-09-12 DIAGNOSIS — Z96651 Presence of right artificial knee joint: Secondary | ICD-10-CM | POA: Diagnosis not present

## 2016-09-12 DIAGNOSIS — Z7982 Long term (current) use of aspirin: Secondary | ICD-10-CM | POA: Diagnosis not present

## 2016-09-12 DIAGNOSIS — K219 Gastro-esophageal reflux disease without esophagitis: Secondary | ICD-10-CM | POA: Diagnosis present

## 2016-09-12 DIAGNOSIS — G629 Polyneuropathy, unspecified: Secondary | ICD-10-CM | POA: Diagnosis present

## 2016-09-12 DIAGNOSIS — E785 Hyperlipidemia, unspecified: Secondary | ICD-10-CM | POA: Diagnosis present

## 2016-09-12 DIAGNOSIS — Z853 Personal history of malignant neoplasm of breast: Secondary | ICD-10-CM

## 2016-09-12 DIAGNOSIS — M19011 Primary osteoarthritis, right shoulder: Principal | ICD-10-CM | POA: Diagnosis present

## 2016-09-12 DIAGNOSIS — Z79899 Other long term (current) drug therapy: Secondary | ICD-10-CM

## 2016-09-12 DIAGNOSIS — G8918 Other acute postprocedural pain: Secondary | ICD-10-CM | POA: Diagnosis not present

## 2016-09-12 DIAGNOSIS — F419 Anxiety disorder, unspecified: Secondary | ICD-10-CM | POA: Diagnosis present

## 2016-09-12 DIAGNOSIS — F329 Major depressive disorder, single episode, unspecified: Secondary | ICD-10-CM | POA: Diagnosis not present

## 2016-09-12 DIAGNOSIS — M48061 Spinal stenosis, lumbar region without neurogenic claudication: Secondary | ICD-10-CM | POA: Diagnosis not present

## 2016-09-12 DIAGNOSIS — Z471 Aftercare following joint replacement surgery: Secondary | ICD-10-CM | POA: Diagnosis not present

## 2016-09-12 DIAGNOSIS — F418 Other specified anxiety disorders: Secondary | ICD-10-CM | POA: Diagnosis not present

## 2016-09-12 DIAGNOSIS — Z96611 Presence of right artificial shoulder joint: Secondary | ICD-10-CM

## 2016-09-12 DIAGNOSIS — I10 Essential (primary) hypertension: Secondary | ICD-10-CM | POA: Diagnosis not present

## 2016-09-12 DIAGNOSIS — Z96642 Presence of left artificial hip joint: Secondary | ICD-10-CM | POA: Diagnosis present

## 2016-09-12 DIAGNOSIS — Z885 Allergy status to narcotic agent status: Secondary | ICD-10-CM | POA: Diagnosis not present

## 2016-09-12 HISTORY — PX: TOTAL SHOULDER ARTHROPLASTY: SHX126

## 2016-09-12 SURGERY — ARTHROPLASTY, SHOULDER, TOTAL
Anesthesia: General | Site: Shoulder | Laterality: Right

## 2016-09-12 MED ORDER — LOSARTAN POTASSIUM-HCTZ 50-12.5 MG PO TABS: 1.0000 | ORAL_TABLET | Freq: Every day | ORAL | Status: DC

## 2016-09-12 MED ORDER — PROPOFOL 10 MG/ML IV BOLUS
INTRAVENOUS | Status: DC | PRN
  Administered 2016-09-12: 120 mg via INTRAVENOUS

## 2016-09-12 MED ORDER — ALUMINUM HYDROXIDE GEL 320 MG/5ML PO SUSP
15.0000 mL | ORAL | Status: DC | PRN
  Filled 2016-09-12: qty 30

## 2016-09-12 MED ORDER — ONDANSETRON HCL 4 MG/2ML IJ SOLN
4.0000 mg | Freq: Four times a day (QID) | INTRAMUSCULAR | Status: DC | PRN
  Administered 2016-09-12: 4 mg via INTRAVENOUS
  Filled 2016-09-12: qty 2

## 2016-09-12 MED ORDER — CEFAZOLIN SODIUM-DEXTROSE 2-4 GM/100ML-% IV SOLN
2.0000 g | INTRAVENOUS | Status: AC
  Administered 2016-09-12: 2 g via INTRAVENOUS
  Filled 2016-09-12: qty 100

## 2016-09-12 MED ORDER — ROSUVASTATIN CALCIUM 10 MG PO TABS
20.0000 mg | ORAL_TABLET | Freq: Every evening | ORAL | Status: DC
  Administered 2016-09-12: 20 mg via ORAL
  Filled 2016-09-12: qty 2

## 2016-09-12 MED ORDER — ROCURONIUM BROMIDE 50 MG/5ML IV SOSY
PREFILLED_SYRINGE | INTRAVENOUS | Status: AC
  Filled 2016-09-12: qty 5

## 2016-09-12 MED ORDER — BISACODYL 5 MG PO TBEC: 5.0000 mg | DELAYED_RELEASE_TABLET | Freq: Every day | ORAL | Status: DC | PRN

## 2016-09-12 MED ORDER — SUGAMMADEX SODIUM 200 MG/2ML IV SOLN
INTRAVENOUS | Status: AC
  Filled 2016-09-12: qty 4

## 2016-09-12 MED ORDER — FENTANYL CITRATE (PF) 100 MCG/2ML IJ SOLN
INTRAMUSCULAR | Status: DC | PRN
  Administered 2016-09-12 (×2): 100 ug via INTRAVENOUS

## 2016-09-12 MED ORDER — 0.9 % SODIUM CHLORIDE (POUR BTL) OPTIME
TOPICAL | Status: DC | PRN
  Administered 2016-09-12: 1000 mL

## 2016-09-12 MED ORDER — HYDROMORPHONE HCL 1 MG/ML IJ SOLN
0.2500 mg | INTRAMUSCULAR | Status: DC | PRN
  Administered 2016-09-12 (×2): 0.5 mg via INTRAVENOUS

## 2016-09-12 MED ORDER — SUGAMMADEX SODIUM 200 MG/2ML IV SOLN
INTRAVENOUS | Status: DC | PRN
  Administered 2016-09-12: 220 mg via INTRAVENOUS

## 2016-09-12 MED ORDER — POVIDONE-IODINE 7.5 % EX SOLN
Freq: Once | CUTANEOUS | Status: DC
  Filled 2016-09-12: qty 118

## 2016-09-12 MED ORDER — ZOLPIDEM TARTRATE 5 MG PO TABS: 5.0000 mg | ORAL_TABLET | Freq: Every evening | ORAL | Status: DC | PRN

## 2016-09-12 MED ORDER — FENTANYL CITRATE (PF) 100 MCG/2ML IJ SOLN
INTRAMUSCULAR | Status: AC
  Filled 2016-09-12: qty 2

## 2016-09-12 MED ORDER — POLYETHYLENE GLYCOL 3350 17 G PO PACK: 17.0000 g | PACK | Freq: Every day | ORAL | Status: DC | PRN

## 2016-09-12 MED ORDER — SODIUM CHLORIDE 0.9 % IV SOLN
INTRAVENOUS | Status: DC
  Administered 2016-09-12 – 2016-09-13 (×2): via INTRAVENOUS

## 2016-09-12 MED ORDER — HYDROMORPHONE HCL 1 MG/ML IJ SOLN
INTRAMUSCULAR | Status: AC
  Filled 2016-09-12: qty 1

## 2016-09-12 MED ORDER — BUPIVACAINE LIPOSOME 1.3 % IJ SUSP
20.0000 mL | INTRAMUSCULAR | Status: AC
  Administered 2016-09-12: 20 mL
  Filled 2016-09-12: qty 20

## 2016-09-12 MED ORDER — ONDANSETRON HCL 4 MG PO TABS: 4.0000 mg | ORAL_TABLET | Freq: Four times a day (QID) | ORAL | Status: DC | PRN

## 2016-09-12 MED ORDER — PHENOL 1.4 % MT LIQD: 1.0000 | OROMUCOSAL | Status: DC | PRN

## 2016-09-12 MED ORDER — HEMOSTATIC AGENTS (NO CHARGE) OPTIME
TOPICAL | Status: DC | PRN
  Administered 2016-09-12: 1 via TOPICAL

## 2016-09-12 MED ORDER — ASPIRIN EC 325 MG PO TBEC
325.0000 mg | DELAYED_RELEASE_TABLET | Freq: Every day | ORAL | Status: DC
  Administered 2016-09-12 – 2016-09-13 (×2): 325 mg via ORAL
  Filled 2016-09-12 (×3): qty 1

## 2016-09-12 MED ORDER — TRANEXAMIC ACID 1000 MG/10ML IV SOLN
1000.0000 mg | INTRAVENOUS | Status: AC
  Administered 2016-09-12: 1000 mg via INTRAVENOUS
  Filled 2016-09-12: qty 10

## 2016-09-12 MED ORDER — LACTATED RINGERS IV SOLN
INTRAVENOUS | Status: DC | PRN
  Administered 2016-09-12 (×2): via INTRAVENOUS

## 2016-09-12 MED ORDER — DEXTROSE 5 % IV SOLN
INTRAVENOUS | Status: DC | PRN
  Administered 2016-09-12: 20 ug/min via INTRAVENOUS

## 2016-09-12 MED ORDER — DIPHENHYDRAMINE HCL 12.5 MG/5ML PO ELIX
12.5000 mg | ORAL_SOLUTION | ORAL | Status: DC | PRN
  Administered 2016-09-12 – 2016-09-13 (×3): 25 mg via ORAL
  Filled 2016-09-12 (×4): qty 10

## 2016-09-12 MED ORDER — PANTOPRAZOLE SODIUM 40 MG PO TBEC
80.0000 mg | DELAYED_RELEASE_TABLET | Freq: Every day | ORAL | Status: DC
  Administered 2016-09-12 – 2016-09-13 (×2): 80 mg via ORAL
  Filled 2016-09-12 (×2): qty 2

## 2016-09-12 MED ORDER — HYDROCHLOROTHIAZIDE 12.5 MG PO CAPS
12.5000 mg | ORAL_CAPSULE | Freq: Every day | ORAL | Status: DC
  Administered 2016-09-12 – 2016-09-13 (×2): 12.5 mg via ORAL
  Filled 2016-09-12 (×2): qty 1

## 2016-09-12 MED ORDER — METOCLOPRAMIDE HCL 5 MG/ML IJ SOLN: 5.0000 mg | Freq: Three times a day (TID) | INTRAMUSCULAR | Status: DC | PRN

## 2016-09-12 MED ORDER — METOCLOPRAMIDE HCL 5 MG PO TABS: 5.0000 mg | ORAL_TABLET | Freq: Three times a day (TID) | ORAL | Status: DC | PRN

## 2016-09-12 MED ORDER — MORPHINE SULFATE (PF) 2 MG/ML IV SOLN
1.0000 mg | INTRAVENOUS | Status: DC | PRN
  Administered 2016-09-13: 1 mg via INTRAVENOUS
  Filled 2016-09-12: qty 1

## 2016-09-12 MED ORDER — SODIUM CHLORIDE 0.9 % IV SOLN
1500.0000 mg | Freq: Once | INTRAVENOUS | Status: AC
  Administered 2016-09-12: 1500 mg via INTRAVENOUS
  Filled 2016-09-12: qty 1500

## 2016-09-12 MED ORDER — DOCUSATE SODIUM 100 MG PO CAPS
100.0000 mg | ORAL_CAPSULE | Freq: Two times a day (BID) | ORAL | Status: DC
  Administered 2016-09-12: 100 mg via ORAL
  Filled 2016-09-12 (×3): qty 1

## 2016-09-12 MED ORDER — SODIUM CHLORIDE 0.9 % IJ SOLN
INTRAMUSCULAR | Status: DC | PRN
  Administered 2016-09-12: 60 mL

## 2016-09-12 MED ORDER — ROCURONIUM BROMIDE 100 MG/10ML IV SOLN
INTRAVENOUS | Status: DC | PRN
  Administered 2016-09-12: 50 mg via INTRAVENOUS

## 2016-09-12 MED ORDER — LOSARTAN POTASSIUM 50 MG PO TABS
50.0000 mg | ORAL_TABLET | Freq: Every day | ORAL | Status: DC
  Administered 2016-09-12 – 2016-09-13 (×2): 50 mg via ORAL
  Filled 2016-09-12 (×2): qty 1

## 2016-09-12 MED ORDER — ONDANSETRON HCL 4 MG/2ML IJ SOLN
INTRAMUSCULAR | Status: DC | PRN
  Administered 2016-09-12: 4 mg via INTRAVENOUS

## 2016-09-12 MED ORDER — MIDAZOLAM HCL 5 MG/5ML IJ SOLN
INTRAMUSCULAR | Status: DC | PRN
  Administered 2016-09-12: 2 mg via INTRAVENOUS

## 2016-09-12 MED ORDER — MEPERIDINE HCL 25 MG/ML IJ SOLN: 6.2500 mg | INTRAMUSCULAR | Status: DC | PRN

## 2016-09-12 MED ORDER — FLEET ENEMA 7-19 GM/118ML RE ENEM: 1.0000 | ENEMA | Freq: Once | RECTAL | Status: DC | PRN

## 2016-09-12 MED ORDER — METOPROLOL SUCCINATE ER 25 MG PO TB24
25.0000 mg | ORAL_TABLET | Freq: Every day | ORAL | Status: DC
  Administered 2016-09-12 – 2016-09-13 (×2): 25 mg via ORAL
  Filled 2016-09-12 (×2): qty 1

## 2016-09-12 MED ORDER — SODIUM CHLORIDE 0.9 % IR SOLN
Status: DC | PRN
  Administered 2016-09-12: 3000 mL

## 2016-09-12 MED ORDER — ONDANSETRON HCL 4 MG/2ML IJ SOLN: 4.0000 mg | Freq: Once | INTRAMUSCULAR | Status: DC | PRN

## 2016-09-12 MED ORDER — PHENYLEPHRINE HCL 10 MG/ML IJ SOLN
INTRAMUSCULAR | Status: DC | PRN
Start: 2016-09-12 — End: 2016-09-12
  Administered 2016-09-12: 80 ug via INTRAVENOUS
  Administered 2016-09-12: 40 ug via INTRAVENOUS
  Administered 2016-09-12 (×2): 80 ug via INTRAVENOUS
  Administered 2016-09-12: 40 ug via INTRAVENOUS

## 2016-09-12 MED ORDER — CEFAZOLIN SODIUM-DEXTROSE 2-4 GM/100ML-% IV SOLN
2.0000 g | Freq: Four times a day (QID) | INTRAVENOUS | Status: AC
  Administered 2016-09-12 – 2016-09-13 (×3): 2 g via INTRAVENOUS
  Filled 2016-09-12 (×3): qty 100

## 2016-09-12 MED ORDER — PROPOFOL 10 MG/ML IV BOLUS
INTRAVENOUS | Status: AC
  Filled 2016-09-12: qty 40

## 2016-09-12 MED ORDER — LIDOCAINE 2% (20 MG/ML) 5 ML SYRINGE
INTRAMUSCULAR | Status: AC
  Filled 2016-09-12: qty 5

## 2016-09-12 MED ORDER — ACETAMINOPHEN 500 MG PO TABS
1000.0000 mg | ORAL_TABLET | Freq: Four times a day (QID) | ORAL | Status: AC
  Administered 2016-09-12 – 2016-09-13 (×4): 1000 mg via ORAL
  Filled 2016-09-12 (×4): qty 2

## 2016-09-12 MED ORDER — PHENYLEPHRINE 40 MCG/ML (10ML) SYRINGE FOR IV PUSH (FOR BLOOD PRESSURE SUPPORT)
PREFILLED_SYRINGE | INTRAVENOUS | Status: AC
  Filled 2016-09-12: qty 10

## 2016-09-12 MED ORDER — MENTHOL 3 MG MT LOZG: 1.0000 | LOZENGE | OROMUCOSAL | Status: DC | PRN

## 2016-09-12 MED ORDER — ONDANSETRON HCL 4 MG/2ML IJ SOLN
INTRAMUSCULAR | Status: AC
  Filled 2016-09-12: qty 2

## 2016-09-12 MED ORDER — MIDAZOLAM HCL 2 MG/2ML IJ SOLN
INTRAMUSCULAR | Status: AC
  Filled 2016-09-12: qty 2

## 2016-09-12 MED ORDER — LIDOCAINE HCL (CARDIAC) 20 MG/ML IV SOLN
INTRAVENOUS | Status: DC | PRN
  Administered 2016-09-12: 100 mg via INTRAVENOUS

## 2016-09-12 MED ORDER — OXYCODONE HCL 5 MG PO TABS
5.0000 mg | ORAL_TABLET | ORAL | Status: DC | PRN
  Administered 2016-09-12 – 2016-09-13 (×2): 10 mg via ORAL
  Filled 2016-09-12 (×3): qty 2

## 2016-09-12 SURGICAL SUPPLY — 69 items
2.25MM GUIDE WIRE ×2 IMPLANT
BIT DRILL 5/64X5 DISP (BIT) IMPLANT
BLADE SAW SAG 73X25 THK (BLADE) ×1
BLADE SAW SGTL 73X25 THK (BLADE) ×1 IMPLANT
BLADE SURG 15 STRL LF DISP TIS (BLADE) ×1 IMPLANT
BLADE SURG 15 STRL SS (BLADE) ×1
CAP SHOULDER TOTAL 2 ×2 IMPLANT
CEMENT BONE DEPUY (Cement) ×2 IMPLANT
CHLORAPREP W/TINT 26ML (MISCELLANEOUS) ×2 IMPLANT
CLSR STERI-STRIP ANTIMIC 1/2X4 (GAUZE/BANDAGES/DRESSINGS) ×2 IMPLANT
COVER SURGICAL LIGHT HANDLE (MISCELLANEOUS) ×2 IMPLANT
DRAPE INCISE IOBAN 66X45 STRL (DRAPES) ×2 IMPLANT
DRAPE ORTHO SPLIT 77X108 STRL (DRAPES) ×2
DRAPE SURG 17X23 STRL (DRAPES) ×2 IMPLANT
DRAPE SURG ORHT 6 SPLT 77X108 (DRAPES) ×2 IMPLANT
DRAPE U-SHAPE 47X51 STRL (DRAPES) ×2 IMPLANT
DRSG AQUACEL AG ADV 3.5X10 (GAUZE/BANDAGES/DRESSINGS) ×2 IMPLANT
ELECT BLADE 4.0 EZ CLEAN MEGAD (MISCELLANEOUS)
ELECT REM PT RETURN 9FT ADLT (ELECTROSURGICAL) ×2
ELECTRODE BLDE 4.0 EZ CLN MEGD (MISCELLANEOUS) IMPLANT
ELECTRODE REM PT RTRN 9FT ADLT (ELECTROSURGICAL) ×1 IMPLANT
EVACUATOR 1/8 PVC DRAIN (DRAIN) IMPLANT
GLOVE BIO SURGEON STRL SZ7 (GLOVE) ×2 IMPLANT
GLOVE BIO SURGEON STRL SZ7.5 (GLOVE) ×2 IMPLANT
GLOVE BIOGEL PI IND STRL 7.0 (GLOVE) ×1 IMPLANT
GLOVE BIOGEL PI IND STRL 8 (GLOVE) ×1 IMPLANT
GLOVE BIOGEL PI INDICATOR 7.0 (GLOVE) ×1
GLOVE BIOGEL PI INDICATOR 8 (GLOVE) ×1
GOWN STRL REUS W/ TWL LRG LVL3 (GOWN DISPOSABLE) ×1 IMPLANT
GOWN STRL REUS W/ TWL XL LVL3 (GOWN DISPOSABLE) ×1 IMPLANT
GOWN STRL REUS W/TWL LRG LVL3 (GOWN DISPOSABLE) ×1
GOWN STRL REUS W/TWL XL LVL3 (GOWN DISPOSABLE) ×1
HANDPIECE INTERPULSE COAX TIP (DISPOSABLE) ×1
HEMOSTAT SURGICEL 2X14 (HEMOSTASIS) ×2 IMPLANT
HOOD PEEL AWAY FLYTE STAYCOOL (MISCELLANEOUS) ×4 IMPLANT
KIT BASIN OR (CUSTOM PROCEDURE TRAY) ×2 IMPLANT
KIT ROOM TURNOVER OR (KITS) ×2 IMPLANT
MANIFOLD NEPTUNE II (INSTRUMENTS) ×2 IMPLANT
NEEDLE HYPO 25GX1X1/2 BEV (NEEDLE) IMPLANT
NEEDLE MAYO TROCAR (NEEDLE) ×2 IMPLANT
NS IRRIG 1000ML POUR BTL (IV SOLUTION) ×2 IMPLANT
PACK SHOULDER (CUSTOM PROCEDURE TRAY) ×2 IMPLANT
PAD ARMBOARD 7.5X6 YLW CONV (MISCELLANEOUS) ×4 IMPLANT
RESTRAINT HEAD UNIVERSAL NS (MISCELLANEOUS) ×2 IMPLANT
RETRIEVER SUT HEWSON (MISCELLANEOUS) ×2 IMPLANT
SET HNDPC FAN SPRY TIP SCT (DISPOSABLE) ×1 IMPLANT
SLING ARM IMMOBILIZER LRG (SOFTGOODS) ×2 IMPLANT
SLING ARM IMMOBILIZER MED (SOFTGOODS) IMPLANT
SMARTMIX MINI TOWER (MISCELLANEOUS) ×2
SPONGE LAP 18X18 X RAY DECT (DISPOSABLE) ×2 IMPLANT
SPONGE LAP 4X18 X RAY DECT (DISPOSABLE) IMPLANT
STRIP CLOSURE SKIN 1/2X4 (GAUZE/BANDAGES/DRESSINGS) ×2 IMPLANT
SUCTION FRAZIER HANDLE 10FR (MISCELLANEOUS) ×1
SUCTION TUBE FRAZIER 10FR DISP (MISCELLANEOUS) ×1 IMPLANT
SUPPORT WRAP ARM LG (MISCELLANEOUS) ×2 IMPLANT
SUT BONE WAX W31G (SUTURE) ×2 IMPLANT
SUT ETHIBOND NAB CT1 #1 30IN (SUTURE) ×6 IMPLANT
SUT MNCRL AB 4-0 PS2 18 (SUTURE) ×2 IMPLANT
SUT SILK 2 0 TIES 17X18 (SUTURE)
SUT SILK 2-0 18XBRD TIE BLK (SUTURE) IMPLANT
SUT VIC AB 2-0 CT1 27 (SUTURE) ×1
SUT VIC AB 2-0 CT1 TAPERPNT 27 (SUTURE) ×1 IMPLANT
SYR CONTROL 10ML LL (SYRINGE) ×4 IMPLANT
SYRINGE 60CC LL (MISCELLANEOUS) ×4 IMPLANT
TAPE LABRALWHITE 1.5X36 (TAPE) ×4 IMPLANT
TAPE SUT LABRALTAP WHT/BLK (SUTURE) ×2 IMPLANT
TOWEL OR 17X24 6PK STRL BLUE (TOWEL DISPOSABLE) ×2 IMPLANT
TOWEL OR 17X26 10 PK STRL BLUE (TOWEL DISPOSABLE) ×2 IMPLANT
TOWER SMARTMIX MINI (MISCELLANEOUS) ×1 IMPLANT

## 2016-09-12 NOTE — Transfer of Care (Signed)
Immediate Anesthesia Transfer of Care Note  [name]  Procedure(s) Performed: Procedure(s) with comments: RIGHT TOTAL SHOULDER ARTHROPLASTY (Right) - RIGHT TOTAL SHOULDER ARTHROPLASTY  Patient Location: PACU  Anesthesia Type:GA combined with regional for post-op pain  Level of Consciousness: awake, oriented and patient cooperative  Airway & Oxygen Therapy: Patient Spontanous Breathing and Patient connected to nasal cannula oxygen  Post-op Assessment: Report given to RN and Post -op Vital signs reviewed and stable  Post vital signs: Reviewed and stable  Last Vitals:  Vitals:   09/12/16 0553 09/12/16 0603  BP: (!) 159/101 (!) 145/100  Pulse: 65   Resp: 20   Temp: 36.8 C     Last Pain:  Vitals:   09/12/16 1011  TempSrc:   PainSc: (P) Asleep      Patients Stated Pain Goal: 2 ([id number])  Complications: No apparent anesthesia complications

## 2016-09-12 NOTE — Anesthesia Procedure Notes (Addendum)
Anesthesia Regional Block:  Interscalene brachial plexus block  Pre-Anesthetic Checklist: ,, timeout performed, Correct Patient, Correct Site, Correct Laterality, Correct Procedure, Correct Position, site marked, Risks and benefits discussed,  Surgical consent,  Pre-op evaluation,  At surgeon's request and post-op pain management  Laterality: Right  Prep: chloraprep       Needles:  Injection technique: Single-shot  Needle Type: Other     Needle Length: 9cm 9 cm Needle Gauge: 22 and 22 G  Needle insertion depth: 5 cm   Additional Needles:  Procedures: ultrasound guided (picture in chart) and nerve stimulator Interscalene brachial plexus block Narrative:  Start time: 09/12/2016 7:10 AM End time: 09/12/2016 7:20 AM Injection made incrementally with aspirations every 5 mL.  Performed by: Personally  Anesthesiologist: Lillia Abed  Additional Notes: Monitors applied. Patient sedated. Sterile prep and drape,hand hygiene and sterile gloves were used. Relevant anatomy identified.Needle position confirmed.Local anesthetic injected incrementally after negative aspiration. Local anesthetic spread visualized around nerve(s). Vascular puncture avoided. No complications. Image printed for medical record.The patient tolerated the procedure well.

## 2016-09-12 NOTE — Anesthesia Procedure Notes (Signed)
Procedure Name: Intubation Date/Time: 09/12/2016 7:43 AM Performed by: Terrill Mohr Pre-anesthesia Checklist: Patient identified, Emergency Drugs available, Suction available and Patient being monitored Patient Re-evaluated:Patient Re-evaluated prior to inductionOxygen Delivery Method: Circle system utilized Preoxygenation: Pre-oxygenation with 100% oxygen Intubation Type: IV induction Ventilation: Mask ventilation without difficulty Laryngoscope Size: Mac and 3 Grade View: Grade II Tube type: Oral Tube size: 7.5 mm Number of attempts: 1 (KBJ unable to get past incisors; KO  DVL ATOI) Airway Equipment and Method: Patient positioned with wedge pillow and Stylet Placement Confirmation: ETT inserted through vocal cords under direct vision,  positive ETCO2 and breath sounds checked- equal and bilateral Secured at: 22 (cm at teeth) cm Tube secured with: Tape Dental Injury: Teeth and Oropharynx as per pre-operative assessment

## 2016-09-12 NOTE — H&P (Signed)
Cynthia Velasquez is an 65 y.o. female.   Chief Complaint: R shoulder pain and dysfunction HPI: R shoulder endstage bone on bone arthritis with severe pain and dysfunction which limit sleep and quality of life, failed conservative measures.  Past Medical History:  Diagnosis Date  . Anxiety   . Arthritis   . Bilateral breast cancer ([facility]) 10/21/2013  . Breast cancer ([place])   . Cancer ([facility])   . Depression   . Gallstones   . GERD (gastroesophageal reflux disease)    doesn't take any meds for this  . Headache    h/o migraines      . History of bladder infections   . History of hiatal hernia   . History of migraine   . Hyperlipidemia    takes Simvastatin daily  . Hypertension    takes Hyzaar  . Joint pain   . Joint swelling   . Lumbar stenosis   . Neuropathy ([facility]) 09/07/2013  . Pneumonia   . Pre-diabetes     Past Surgical History:  Procedure Laterality Date  . ABDOMINAL HYSTERECTOMY    . bone spur removed from left foot    . CHOLECYSTECTOMY    . COLONOSCOPY    . double mastectomy     . HERNIA REPAIR     umbilical  . right knee arthroscopy    . right shoulder arthroscopy    . surgery for hiatal hernia    . TONSILLECTOMY    . TOTAL HIP ARTHROPLASTY Left 02/12/2013  . TOTAL HIP ARTHROPLASTY Left 02/12/2013   Procedure: LEFT TOTAL HIP ARTHROPLASTY;  Surgeon: Kerin Salen, MD;  Location: [facility];  Service: Orthopedics;  Laterality: Left;  . TOTAL HIP ARTHROPLASTY Right 05/03/2013   Procedure: TOTAL HIP ARTHROPLASTY;  Surgeon: Kerin Salen, MD;  Location: [facility];  Service: Orthopedics;  Laterality: Right;  . TOTAL KNEE ARTHROPLASTY Right 02/27/2015   Procedure: TOTAL KNEE ARTHROPLASTY;  Surgeon: Frederik Pear, MD;  Location: [facility];  Service: Orthopedics;  Laterality: Right;    Family History  Problem Relation Age of Onset  . Adopted: Yes   Social History:  reports that she has never smoked. She has never used smokeless tobacco. She reports that she does not drink alcohol or use  drugs.  Allergies:  Allergies  Allergen Reactions  . Oxycodone-Acetaminophen Hives and Itching  . Codeine Itching and Rash  . Vicodin [Hydrocodone-Acetaminophen] Rash    Medications Prior to Admission  Medication Sig Dispense Refill  . losartan-hydrochlorothiazide (HYZAAR) 50-12.5 MG per tablet Take 1 tablet by mouth daily.     . metoprolol succinate (TOPROL-XL) 25 MG 24 hr tablet Take 25 mg by mouth daily.  3  . nitroGLYCERIN (NITROSTAT) 0.4 MG SL tablet Place 0.4 mg under the tongue every 5 (five) minutes x 3 doses as needed for chest pain.  0  . omeprazole (PRILOSEC) 40 MG capsule Take 40 mg by mouth daily.  5  . rosuvastatin (CRESTOR) 20 MG tablet Take 20 mg by mouth every evening.  3  . traMADol (ULTRAM) 50 MG tablet Take by mouth every 6 (six) hours as needed.    [name] Kitchen aspirin-acetaminophen-caffeine (EXCEDRIN MIGRAINE) 250-250-65 MG tablet Take 1 tablet by mouth every 6 (six) hours as needed for headache or migraine.     . diphenhydrAMINE (BENADRYL) 25 MG tablet Take 25 mg by mouth daily as needed for itching or allergies.       No results found for this or any previous visit (from the past  48 hour(s)). No results found.  Review of Systems  All other systems reviewed and are negative.   Blood pressure (!) 145/100, pulse 65, temperature 98.3 F (36.8 C), temperature source Oral, resp. rate 20, height 5' 5.25" (1.657 m), weight 106.6 kg (235 lb), SpO2 100 %. Physical Exam  Constitutional: She is oriented to person, place, and time. She appears well-developed and well-nourished.  HENT:  Head: Atraumatic.  Eyes: EOM are normal.  Cardiovascular: Intact distal pulses.   Respiratory: Effort normal.  Musculoskeletal:  R shoulder pain with limited ROM.  Neurological: She is alert and oriented to person, place, and time.  Skin: Skin is warm and dry.  Psychiatric: She has a normal mood and affect.     Assessment/Plan R shoulder endstage bone on bone arthritis with severe pain and  dysfunction which limit sleep and quality of life, failed conservative measures. Risks / benefits of surgery discussed Consent on chart  NPO for OR Preop antibiotics   Nita Sells, MD 09/12/2016, 7:12 AM

## 2016-09-12 NOTE — Discharge Instructions (Signed)

## 2016-09-12 NOTE — Op Note (Signed)
Procedure(s): RIGHT TOTAL SHOULDER ARTHROPLASTY Procedure Note  Cynthia Velasquez female 65 y.o. 09/12/2016  Procedure(s) and Anesthesia Type:    * RIGHT TOTAL SHOULDER ARTHROPLASTY - General  Surgeon(s) and Role:    * Tania Ade, MD - Primary   Indications:  65 y.o. female  With endstage right shoulder arthritis. Pain and dysfunction interfered with quality of life and nonoperative treatment with activity modification, NSAIDS and injections failed.     Surgeon: Nita Sells   Assistants: Jeanmarie Hubert PA-C [facility] was present and scrubbed throughout the procedure and was essential in positioning, retraction, exposure, and closure)  Anesthesia: General endotracheal anesthesia with preoperative interscalene block given by the attending anesthesiologist    Procedure Detail  RIGHT TOTAL SHOULDER ARTHROPLASTY  Findings: Tornier flex anatomic press-fit size 4 stem with a 46 head, cemented size 30 medium Cortiloc glenoid.   A lesser tuberosity osteotomy was performed and repaired at the conclusion of the procedure.  Estimated Blood Loss:  200 mL         Drains: None   Blood Given: none          Specimens: none        Complications:  * No complications entered in OR log *         Disposition: PACU - hemodynamically stable.         Condition: stable    Procedure:   The patient was identified in the preoperative holding area where I personally marked the operative extremity after verifying with the patient and consent. She  was taken to the operating room where She was transferred to the   operative table.  The patient received an interscalene block in   the holding area by the attending anesthesiologist.  General anesthesia was induced   in the operating room without complication.  The patient did receive IV  Ancef prior to the commencement of the procedure.  The patient was   placed in the beach-chair position with the back raised about 30   degrees.   The nonoperative extremity and head and neck were carefully   positioned and padded protecting against neurovascular compromise.  The   left upper extremity was then prepped and draped in the standard sterile   fashion.    The appropriate operative time-out was performed with   Anesthesia, the perioperative staff, as well as myself and we all agreed   that the right side was the correct operative site.  The patient received 1 g IV tranexamic acid at the start of the case around time of the incision. An approximately 10 cm incision was made from the tip of the coracoid to the center point of the   humerus at the level of the axilla.  Dissection was carried down sharply   through subcutaneous tissues and cephalic vein was identified and taken   laterally with the deltoid.  The pectoralis major was taken medially.  The   upper 1 cm of the pectoralis major was released from its attachment on   the humerus.  The clavipectoral fascia was incised just lateral to the   conjoined tendon.  This incision was carried up to but not into the   coracoacromial ligament.  Digital palpation was used to prove   integrity of the axillary nerve which was protected throughout the   procedure.  Musculocutaneous nerve was not palpated in the operative   field.  Conjoined tendon was then retracted gently medially and the   deltoid laterally.  Anterior  circumflex humeral vessels were clamped and   coagulated.  The soft tissues overlying the biceps was incised and this   incision was carried across the transverse humeral ligament to the base   of the coracoid.  The biceps was tenodesed to the soft tissue just above   pectoralis major and the remaining portion of the biceps superiorly was   excised.  An osteotomy was performed at the lesser tuberosity.  Capsule was then   released all the way down to the 6 o'clock position of the humeral head.   The humeral head was then delivered with simultaneous adduction,    extension and external rotation.  All humeral osteophytes were removed   and the anatomic neck of the humerus was marked and cut free hand at   approximately 25 degrees retroversion within about 3 mm of the cuff   reflection posteriorly.  The head size was estimated to be a 46 medium   offset.  At that point, the humeral head was retracted posteriorly with   a Fukuda retractor.   Remaining portion of the capsule was released at the base of the   coracoid.  The remaining biceps anchor and the entire anterior-inferior   labrum was excised.  The posterior labrum was also excised but the   posterior capsule was not released.  The guidepin was placed bicortically with 0 elevated guide.  The reamer was used to ream to concentric bone with punctate bleeding.  This gave an excellent concentric surface.  The center hole was then drilled for an anchor peg glenoid followed by the three peripheral holes and none of the holes   exited the glenoid wall.  I then pulse irrigated these holes and dried   them with Surgicel.  The three peripheral holes were then   pressurized cemented and the anchor peg glenoid was placed and impacted   with an excellent fit.  The glenoid was a 30 M component.  The proximal humerus was then again exposed taking care not to displace the glenoid.    The entry awl was used followed by sounding reamers and then sequentially broached from size 2 to 4. This was then left in place and the calcar planer was used. Trial head was placed with a 46.  With the trial implantation of the component,  there was approximately 50% posterior translation with immediate snap back to the   anatomic position.  With forward elevation, there was no tendency   towards posterior subluxation.   The trial was removed and the final implant was prepared on a back table.  The trial was removed and the final implant was prepared on a back table.   3 small holes were drilled on the medial side of the lesser tuberosity  osteotomy, through which 2 labral tapes were passed. The implant was then placed through the loop of the 2 labral tapes and impacted with an excellent press-fit. This achieved excellent anatomic reconstruction of the proximal humerus.  The joint was then copiously irrigated with pulse lavage.  The subscapularis and   lesser tuberosity osteotomy were then repaired using the 2 labral tapes previously passed in a double row fashion with horizontal mattress sutures medially brought over through bone tunnels tied over a bone bridge laterally.   One #1 Ethibond was placed at the rotator interval just above   the lesser tuberosity. Copious irrigation was used. Throughout the case a mixture of 20 mL liposomal bupivacaine and 60 mL normal saline was used  to infiltrate the deep capsular tissue, bony surfaces and subcutaneous tissue. Skin was closed with 2-0 Vicryl sutures in the deep dermal layer and 4-0 Monocryl in a subcuticular  running fashion.  Sterile dressings were then applied including Aquacel.  The patient was placed in a sling and allowed to awaken from general anesthesia and taken to the recovery room in stable condition.      POSTOPERATIVE PLAN:  Early passive range of motion will be allowed with the goal of 0 degrees external rotation and 90 degrees forward elevation.  No internal rotation at this time.  No active motion of the arm until the lesser tuberosity heals.  The patient will likely be kept in the hospital for 1-2 days and then discharged home.

## 2016-09-12 NOTE — Anesthesia Preprocedure Evaluation (Addendum)
Anesthesia Evaluation  Patient identified by MRN, date of birth, ID band Patient awake    Reviewed: Allergy & Precautions, NPO status , Patient's Chart, lab work & pertinent test results  Airway Mallampati: I  TM Distance: >3 FB Neck ROM: Limited    Dental  (+) Teeth Intact, Dental Advisory Given   Pulmonary    Pulmonary exam normal        Cardiovascular hypertension, Pt. on medications and Pt. on home beta blockers Normal cardiovascular exam     Neuro/Psych Anxiety Depression    GI/Hepatic GERD  Medicated and Controlled,  Endo/Other    Renal/GU      Musculoskeletal   Abdominal   Peds  Hematology   Anesthesia Other Findings   Reproductive/Obstetrics                            Anesthesia Physical Anesthesia Plan  ASA: II  Anesthesia Plan: General   Post-op Pain Management:  Regional for Post-op pain   Induction: Intravenous  Airway Management Planned: Oral ETT  Additional Equipment:   Intra-op Plan:   Post-operative Plan: Extubation in OR  Informed Consent: I have reviewed the patients History and Physical, chart, labs and discussed the procedure including the risks, benefits and alternatives for the proposed anesthesia with the patient or authorized representative who has indicated his/her understanding and acceptance.     Plan Discussed with: CRNA and Surgeon  Anesthesia Plan Comments:         Anesthesia Quick Evaluation

## 2016-09-12 NOTE — Anesthesia Postprocedure Evaluation (Signed)
Anesthesia Post Note  Patient: Rubye Bia  Procedure(s) Performed: Procedure(s) (LRB): RIGHT TOTAL SHOULDER ARTHROPLASTY (Right)  Patient location during evaluation: PACU Anesthesia Type: General Level of consciousness: awake and alert Pain management: pain level controlled Vital Signs Assessment: post-procedure vital signs reviewed and stable Respiratory status: spontaneous breathing, nonlabored ventilation, respiratory function stable and patient connected to nasal cannula oxygen Cardiovascular status: blood pressure returned to baseline and stable Postop Assessment: no signs of nausea or vomiting Anesthetic complications: no       Last Vitals:  Vitals:   09/12/16 1115 09/12/16 1300  BP:  117/70  Pulse: (!) 51 62  Resp: 11 17  Temp:  36.5 C    Last Pain:  Vitals:   09/12/16 1300  TempSrc: Oral  PainSc:                  Oda Placke DAVID

## 2016-09-13 ENCOUNTER — Encounter (HOSPITAL_COMMUNITY): Payer: Self-pay | Admitting: Orthopedic Surgery

## 2016-09-13 LAB — CBC
HCT: 33.2 % — ABNORMAL LOW (ref 36.0–46.0)
Hemoglobin: 10.4 g/dL — ABNORMAL LOW (ref 12.0–15.0)
MCH: 25.7 pg — ABNORMAL LOW (ref 26.0–34.0)
MCHC: 31.3 g/dL (ref 30.0–36.0)
MCV: 82 fL (ref 78.0–100.0)
PLATELETS: 191 10*3/uL (ref 150–400)
RBC: 4.05 MIL/uL (ref 3.87–5.11)
RDW: 14.7 % (ref 11.5–15.5)
WBC: 7.6 10*3/uL (ref 4.0–10.5)

## 2016-09-13 LAB — BASIC METABOLIC PANEL
ANION GAP: 8 (ref 5–15)
BUN: 15 mg/dL (ref 6–20)
CALCIUM: 8 mg/dL — AB (ref 8.9–10.3)
CO2: 24 mmol/L (ref 22–32)
Chloride: 106 mmol/L (ref 101–111)
Creatinine, Ser: 1.16 mg/dL — ABNORMAL HIGH (ref 0.44–1.00)
GFR, EST AFRICAN AMERICAN: 56 mL/min — AB (ref >60)
GFR, EST NON AFRICAN AMERICAN: 48 mL/min — AB (ref >60)
GLUCOSE: 112 mg/dL — AB (ref 65–99)
Potassium: 3.4 mmol/L — ABNORMAL LOW (ref 3.5–5.1)
Sodium: 138 mmol/L (ref 135–145)

## 2016-09-13 MED ORDER — DOCUSATE SODIUM 100 MG PO CAPS: 100.0000 mg | ORAL_CAPSULE | Freq: Three times a day (TID) | ORAL | 0 refills | Status: DC | PRN

## 2016-09-13 MED ORDER — OXYCODONE-ACETAMINOPHEN 5-325 MG PO TABS: 1.0000 | ORAL_TABLET | ORAL | 0 refills | Status: DC | PRN

## 2016-09-13 NOTE — Progress Notes (Signed)
Occupational Therapy Treatment and Discharge Patient Details Name: Cynthia Velasquez MRN: [id number] DOB: 07-01-1951 Today's Date: 09/13/2016    History of present illness 65 y/o female presenting s/p right total shoulder arthroplasty.Pt has a past medical history of Anxiety; Arthritis; Bilateral breast cancer (10/21/2013); Depression; Gallstones; GERD; Headache; History of bladder infections; History of hiatal hernia; History of migraine; Hyperlipidemia; Hypertension; Joint pain; Joint swelling; Lumbar stenosis; Neuropathy; Pneumonia; and Pre-diabetes. Pt has a past surgical history that includes double mastectomy ; Tonsillectomy; Abdominal hysterectomy; Hernia repair; Cholecystectomy; bone spur removed from left foot; right knee arthroscopy; right shoulder arthroscopy; Colonoscopy; Total hip arthroplasty (Left, 02/12/2013); Total hip arthroplasty (Right, 05/03/2013); and Total knee arthroplasty (Right, 02/27/2015).   OT comments  Pt completed and met all OT goals this session. Session focused on reinforcing shoulder d/c information from handout, getting dressed with caregiver acting as support for Pt and reviewing exercises with daughter so that caregiver burden is decreased. Pt and daughter with no questions or concerns and verbally confirmed and demonstrated understanding of precautions and exercises/compensatory strategies. OT to sign off at this time.   Follow Up Recommendations  No OT follow up;Supervision/Assistance - 24 hour (initially, progress rehab of shoulder as ordered by MD)    Equipment Recommendations  None recommended by OT    Recommendations for Other Services      Precautions / Restrictions Precautions Precautions: Shoulder Type of Shoulder Precautions: Passive Protocol Shoulder Interventions: Shoulder sling/immobilizer;At all times;Off for dressing/bathing/exercises Precaution Booklet Issued: Yes (comment) Precaution Comments: OT shoulder d/c handout reviewed in full again with  Pt and family Required Braces or Orthoses: Sling Restrictions Weight Bearing Restrictions: Yes RUE Weight Bearing: Non weight bearing       Mobility Bed Mobility                  Transfers Overall transfer level: Needs assistance Equipment used: None Transfers: Sit to/from Stand Sit to Stand: Supervision         General transfer comment: steadyness improved this afternoon    Balance Overall balance assessment: Needs assistance Sitting-balance support: Single extremity supported;Feet supported Sitting balance-Leahy Scale: Good Sitting balance - Comments: sitting EOB for dressing tasks with no back support   Standing balance support: No upper extremity supported;During functional activity Standing balance-Leahy Scale: Fair Standing balance comment: standing for dressing                   ADL Overall ADL's : Needs assistance/impaired                 Upper Body Dressing : Maximal assistance;With caregiver independent assisting;Adhering to UE precautions;Standing Upper Body Dressing Details (indicate cue type and reason): Pt and daughter donned shirt and sling without any assist from the OT Lower Body Dressing: Moderate assistance;With caregiver independent assisting;Sit to/from stand Lower Body Dressing Details (indicate cue type and reason): Pt and daughter donned underwear, pants, socks, and shoes with no physical assist from the OT and no LOB             Functional mobility during ADLs: Supervision/safety General ADL Comments: Pt motivated to maximize independence      Vision                     Perception     Praxis      Cognition   Behavior During Therapy: [facility] for tasks assessed/performed Overall Cognitive Status: Within Functional Limits for tasks assessed  Extremity/Trunk Assessment               Exercises Shoulder Exercises Shoulder Flexion: PROM;Right;10 reps;Supine Shoulder  Extension: PROM;Right;10 reps;Supine Shoulder External Rotation: PROM;Right;Seated;Other (comment) (to neutral only) Elbow Flexion: PROM;AROM;Right;10 reps;15 reps;Standing Elbow Extension: PROM;AROM;Right;15 reps;10 reps;Standing Wrist Flexion: AROM;Right Wrist Extension: AROM;Right Digit Composite Flexion: AROM;Right Composite Extension: AROM;Right Neck Flexion: AROM Neck Extension: AROM Neck Lateral Flexion - Right: AROM Neck Lateral Flexion - Left: AROM Donning/doffing shirt without moving shoulder: Patient able to independently direct caregiver;Maximal assistance Method for sponge bathing under operated UE: Modified independent Donning/doffing sling/immobilizer: Maximal assistance;Patient able to independently direct caregiver Correct positioning of sling/immobilizer: Modified independent ROM for elbow, wrist and digits of operated UE: Modified independent Sling wearing schedule (on at all times/off for ADL's): Modified independent Proper positioning of operated UE when showering: Minimal assistance;Patient able to independently direct caregiver Positioning of UE while sleeping: Modified independent   Shoulder Instructions Shoulder Instructions Donning/doffing shirt without moving shoulder: Patient able to independently direct caregiver;Maximal assistance Method for sponge bathing under operated UE: Modified independent Donning/doffing sling/immobilizer: Maximal assistance;Patient able to independently direct caregiver Correct positioning of sling/immobilizer: Modified independent ROM for elbow, wrist and digits of operated UE: Modified independent Sling wearing schedule (on at all times/off for ADL's): Modified independent Proper positioning of operated UE when showering: Minimal assistance;Patient able to independently direct caregiver Positioning of UE while sleeping: Modified independent     General Comments      Pertinent Vitals/ Pain       Pain Assessment: 0-10 Pain  Score: 6  Pain Location: R shoulder Pain Descriptors / Indicators: Sore;Throbbing;Stabbing;Operative site guarding;Grimacing Pain Intervention(s): Monitored during session;Repositioned;Patient requesting pain meds-RN notified  Home Living                                          Prior Functioning/Environment              Frequency  Other (comment) (BID)        Progress Toward Goals  OT Goals(current goals can now be found in the care plan section)  Progress towards OT goals: Goals met/education completed, patient discharged from OT  Acute Rehab OT Goals Patient Stated Goal: to get home OT Goal Formulation: With patient Time For Goal Achievement: 09/20/16 Potential to Achieve Goals: Good ADL Goals Pt Will Perform Upper Body Bathing: with modified independence;standing (using technique to clean armpit as demonstrated by OT) Pt Will Perform Upper Body Dressing: with min guard assist;sitting;standing (dressing operated arm first) Pt/caregiver will Perform Home Exercise Program: Right Upper extremity;With Supervision;With written HEP provided  Plan All goals met and education completed, patient discharged from OT services    Co-evaluation                 End of Session Equipment Utilized During Treatment: Other (comment) (sling)   Activity Tolerance Patient tolerated treatment well   Patient Left in chair;with call bell/phone within reach;with family/visitor present   Nurse Communication Mobility status;Patient requests pain meds        Time: 7939-0300 OT Time Calculation (min): 16 min  Charges: OT General Charges $OT Visit: 1 Procedure OT Treatments $Self Care/Home Management : 8-22 mins  Merri Ray Joeann Steppe 09/13/2016, 2:35 PM  Hulda Humphrey OTR/L [phone]

## 2016-09-13 NOTE — Evaluation (Signed)
Occupational Therapy Evaluation Patient Details Name: Cynthia Velasquez MRN: [id number] DOB: 1951-09-15 Today's Date: 09/13/2016    History of Present Illness 65 y/o female presenting s/p right total shoulder arthroplasty.Pt has a past medical history of Anxiety; Arthritis; Bilateral breast cancer (10/21/2013); Depression; Gallstones; GERD; Headache; History of bladder infections; History of hiatal hernia; History of migraine; Hyperlipidemia; Hypertension; Joint pain; Joint swelling; Lumbar stenosis; Neuropathy; Pneumonia; and Pre-diabetes. Pt has a past surgical history that includes double mastectomy ; Tonsillectomy; Abdominal hysterectomy; Hernia repair; Cholecystectomy; bone spur removed from left foot; right knee arthroscopy; right shoulder arthroscopy; Colonoscopy; Total hip arthroplasty (Left, 02/12/2013); Total hip arthroplasty (Right, 05/03/2013); and Total knee arthroplasty (Right, 02/27/2015).   Clinical Impression   PTA pt independent in ADL and mobility with SPC, Pt currently mod assist for bilateral ADL, and min guard for ambulation. OT reviewed shoulder handout in full with Pt and all education provided. HEP demonstrated by OT, practiced with PT and Pt demonstrated independence and understanding (or can direct caregiver). NWB status reviewed. Pt with no further questions or concerns. Pt at adequate level to d/c home with care giving assistance provided by daughter. As time allows, OT would like to have second session to continue practice of education by assisting Pt to get dressed to discharge, as well as provide caregiver education/opportunity for daughter to ask questions as she was not present for this mornings session. However, this should not prevent the patient from discharging.    Follow Up Recommendations  No OT follow up;Supervision/Assistance - 24 hour initially (progress rehab of shoulder as ordered by MD)    Equipment Recommendations  None recommended by OT     Recommendations for Other Services       Precautions / Restrictions Precautions Precautions: Shoulder Type of Shoulder Precautions: Passive Protocol Shoulder Interventions: Shoulder sling/immobilizer;At all times;Off for dressing/bathing/exercises Precaution Booklet Issued: Yes (comment) Precaution Comments: OT shoulder d/c handout reviewed in full Required Braces or Orthoses: Sling Restrictions Weight Bearing Restrictions: Yes RUE Weight Bearing: Non weight bearing Other Position/Activity Restrictions: No Abduction, ER limited to neutral, no pendulums      Mobility Bed Mobility Overal bed mobility: Needs Assistance Bed Mobility: Supine to Sit     Supine to sit: Min guard     General bed mobility comments: increased time, but no assist needed, min guard for safety  Transfers Overall transfer level: Needs assistance Equipment used:  (sling, IV pole) Transfers: Sit to/from Stand Sit to Stand: Min guard         General transfer comment: for safety    Balance Overall balance assessment: Needs assistance Sitting-balance support: Single extremity supported;Feet supported Sitting balance-Leahy Scale: Fair     Standing balance support: No upper extremity supported;During functional activity Standing balance-Leahy Scale: Fair Standing balance comment: short distance from bed to chair                            ADL Overall ADL's : Needs assistance/impaired Eating/Feeding: Set up;Sitting Eating/Feeding Details (indicate cue type and reason): able to open up coffee creamer and sugar packets independently Grooming: Supervision/safety;Standing                   Toilet Transfer: Min guard;Comfort height toilet Toilet Transfer Details (indicate cue type and reason): simulated with recliner (denied having to go, and reports using BSC earlier)         Functional mobility during ADLs: Min guard (pushing IV pole) General ADL  Comments: Pt motivated to  maximize independence     Vision Vision Assessment?: No apparent visual deficits   Perception     Praxis      Pertinent Vitals/Pain Pain Assessment: 0-10 Pain Score: 8  Pain Location: R shoulder Pain Descriptors / Indicators: Sore;Throbbing;Stabbing;Operative site guarding;Grimacing Pain Intervention(s): Monitored during session;Repositioned;RN gave pain meds during session;Ice applied     Hand Dominance Right   Extremity/Trunk Assessment Upper Extremity Assessment Upper Extremity Assessment: RUE deficits/detail RUE Deficits / Details: post op deficits in strength and ROM RUE: Unable to fully assess due to pain;Unable to fully assess due to immobilization   Lower Extremity Assessment Lower Extremity Assessment: Overall WFL for tasks assessed (history of Bil Hip replacements and RTKA)   Cervical / Trunk Assessment Cervical / Trunk Assessment: Normal   Communication Communication Communication: No difficulties   Cognition Arousal/Alertness: Awake/alert Behavior During Therapy: WFL for tasks assessed/performed Overall Cognitive Status: Within Functional Limits for tasks assessed                     General Comments       Exercises Exercises: Shoulder     Shoulder Instructions Shoulder Instructions Donning/doffing shirt without moving shoulder: Moderate assistance;Patient able to independently direct caregiver Method for sponge bathing under operated UE: Modified independent Donning/doffing sling/immobilizer: Maximal assistance;Patient able to independently direct caregiver Correct positioning of sling/immobilizer: Modified independent ROM for elbow, wrist and digits of operated UE: Modified independent Sling wearing schedule (on at all times/off for ADL's): Modified independent Proper positioning of operated UE when showering: Minimal assistance;Patient able to independently direct caregiver Positioning of UE while sleeping: Modified independent    Home  Living Family/patient expects to be discharged to:: Private residence Living Arrangements: Children (27) Available Help at Discharge: Family;Available 24 hours/day Type of Home: Apartment Home Access: Stairs to enter Entrance Stairs-Number of Steps: 1 Entrance Stairs-Rails: None Home Layout: One level     Bathroom Shower/Tub: Tub/shower unit Shower/tub characteristics: Architectural technologist: Standard     Home Equipment: Bedside commode;Cane - single point          Prior Functioning/Environment Level of Independence: Independent with assistive device(s)        Comments: used SPC for ambulation        OT Problem List: Decreased strength;Decreased range of motion;Decreased activity tolerance;Decreased knowledge of use of DME or AE;Decreased knowledge of precautions;Impaired UE functional use;Pain   OT Treatment/Interventions: Self-care/ADL training;Therapeutic exercise;Patient/family education    OT Goals(Current goals can be found in the care plan section) Acute Rehab OT Goals Patient Stated Goal: to get home OT Goal Formulation: With patient Time For Goal Achievement: 09/20/16 Potential to Achieve Goals: Good ADL Goals Pt Will Perform Upper Body Bathing: with modified independence;standing (using technique to clean armpit as demonstrated by OT) Pt Will Perform Upper Body Dressing: with min guard assist;sitting;standing (dressing operated arm first) Pt/caregiver will Perform Home Exercise Program: Right Upper extremity;With Supervision;With written HEP provided  OT Frequency: Other (comment) (BID for one occurance)   Barriers to D/C:            Co-evaluation              End of Session Equipment Utilized During Treatment: Other (comment) (sling) Nurse Communication: Other (comment) (in room administering pain medicine)  Activity Tolerance: Patient tolerated treatment well Patient left: in chair;with call bell/phone within reach;with nursing/sitter in room    Time: [id number] OT Time Calculation (min): 59 min Charges:  OT General Charges $  OT Visit: 1 Procedure OT Evaluation $OT Eval Moderate Complexity: 1 Procedure OT Treatments $Self Care/Home Management : 23-37 mins $Therapeutic Exercise: 8-22 mins G-Codes:    Merri Ray Louretta Tantillo 09/16/16, 10:43 AM  Hulda Humphrey OTR/L [phone]

## 2016-09-13 NOTE — Progress Notes (Signed)
   PATIENT ID: Cynthia Velasquez   1 Day Post-Op Procedure(s) (LRB): RIGHT TOTAL SHOULDER ARTHROPLASTY (Right)  Subjective: Doing well, minimal pain  Objective:  Vitals:   09/12/16 2014 09/13/16 0500  BP: 125/62 (!) 101/54  Pulse: 78 68  Resp: 16 16  Temp: 98.5 F (36.9 C) 99.1 F (37.3 C)     R UE dressing c/d/i Wiggles fingers, min distal swelling in hang Distally NVI  Labs:   Recent Labs  09/13/16 0538  HGB 10.4*   Recent Labs  09/13/16 0538  WBC 7.6  RBC 4.05  HCT 33.2*  PLT 191   Recent Labs  09/13/16 0538  NA 138  K 3.4*  CL 106  CO2 24  BUN 15  CREATININE 1.16*  GLUCOSE 112*  CALCIUM 8.0*    Assessment and Plan: 1 day s/p R TSA Sling, OT to show PROM limited to 90 FF and 0 ER Percocet for pain control D/c home today, scripts in chart Fu with Dr. Tamera Punt in 2 weeks  VTE proph: ASA, SCDs

## 2016-09-13 NOTE — Discharge Summary (Signed)
Patient ID: Cynthia Velasquez MRN: [id number] DOB/AGE: Nov 24, 1950 65 y.o.  Admit date: 09/12/2016 Discharge date: 09/13/2016  Admission Diagnoses:  Active Problems:   Status post total shoulder arthroplasty, right   Discharge Diagnoses:  Same  Past Medical History:  Diagnosis Date  . Anxiety   . Arthritis   . Bilateral breast cancer ([place]) 10/21/2013  . Breast cancer ([place])   . Cancer ([place])   . Depression   . Gallstones   . GERD (gastroesophageal reflux disease)    doesn't take any meds for this  . Headache    h/o migraines      . History of bladder infections   . History of hiatal hernia   . History of migraine   . Hyperlipidemia    takes Simvastatin daily  . Hypertension    takes Hyzaar  . Joint pain   . Joint swelling   . Lumbar stenosis   . Neuropathy ([place]) 09/07/2013  . Pneumonia   . Pre-diabetes     Surgeries: Procedure(s): RIGHT TOTAL SHOULDER ARTHROPLASTY on 09/12/2016   Consultants:   Discharged Condition: Improved  Hospital Course: Neesa Keith is an 65 y.o. female who was admitted 09/12/2016 for operative treatment of right shoulder end stage OA. Patient has severe unremitting pain that affects sleep, daily activities, and work/hobbies. After pre-op clearance the patient was taken to the operating room on 09/12/2016 and underwent  Procedure(s): RIGHT TOTAL SHOULDER ARTHROPLASTY.    Patient was given perioperative antibiotics: Anti-infectives    Start     Dose/Rate Route Frequency Ordered Stop   09/12/16 1600  ceFAZolin (ANCEF) IVPB 2g/100 mL premix     2 g 200 mL/hr over 30 Minutes Intravenous Every 6 hours 09/12/16 1136 09/13/16 0449   09/12/16 0530  vancomycin (VANCOCIN) 1,500 mg in sodium chloride 0.9 % 500 mL IVPB     1,500 mg 250 mL/hr over 120 Minutes Intravenous  Once 09/12/16 0523 09/12/16 0845   09/12/16 0523  ceFAZolin (ANCEF) IVPB 2g/100 mL premix     2 g 200 mL/hr over 30 Minutes Intravenous On call to O.R. 09/12/16 [id number] 09/12/16 0810        Patient was given sequential compression devices, early ambulation, and asa to prevent DVT.  Patient benefited maximally from hospital stay and there were no complications.    Recent vital signs: Patient Vitals for the past 24 hrs:  BP Temp Temp src Pulse Resp SpO2  09/13/16 0500 (!) 101/54 99.1 F (37.3 C) Oral 68 16 94 %  09/12/16 2014 125/62 98.5 F (36.9 C) Oral 78 16 95 %  09/12/16 1300 117/70 97.7 F (36.5 C) Oral 62 17 100 %  09/12/16 1115 - - - (!) 51 11 93 %  09/12/16 1111 - 97.7 F (36.5 C) - - - 94 %  09/12/16 1100 112/79 - - (!) 41 16 (!) 89 %  09/12/16 1045 125/71 - - 63 13 96 %  09/12/16 1030 119/82 - - 69 17 91 %  09/12/16 1015 (!) 141/104 - - 79 15 95 %  09/12/16 1011 - 97.8 F (36.6 C) - - - -     Recent laboratory studies:  Recent Labs  09/13/16 0538  WBC 7.6  HGB 10.4*  HCT 33.2*  PLT 191  NA 138  K 3.4*  CL 106  CO2 24  BUN 15  CREATININE 1.16*  GLUCOSE 112*  CALCIUM 8.0*     Discharge Medications:   Allergies as of 09/13/2016  Reactions   Oxycodone-acetaminophen Hives, Itching   Codeine Itching, Rash   Vicodin [hydrocodone-acetaminophen] Rash      Medication List    STOP taking these medications   aspirin-acetaminophen-caffeine 250-250-65 MG tablet Commonly known as:  EXCEDRIN MIGRAINE     TAKE these medications   diphenhydrAMINE 25 MG tablet Commonly known as:  BENADRYL Take 25 mg by mouth daily as needed for itching or allergies.   docusate sodium 100 MG capsule Commonly known as:  COLACE Take 1 capsule (100 mg total) by mouth 3 (three) times daily as needed.   losartan-hydrochlorothiazide 50-12.5 MG tablet Commonly known as:  HYZAAR Take 1 tablet by mouth daily.   metoprolol succinate 25 MG 24 hr tablet Commonly known as:  TOPROL-XL Take 25 mg by mouth daily.   nitroGLYCERIN 0.4 MG SL tablet Commonly known as:  NITROSTAT Place 0.4 mg under the tongue every 5 (five) minutes x 3 doses as needed for chest  pain.   omeprazole 40 MG capsule Commonly known as:  PRILOSEC Take 40 mg by mouth daily.   oxyCODONE-acetaminophen 5-325 MG tablet Commonly known as:  ROXICET Take 1-2 tablets by mouth every 4 (four) hours as needed for severe pain.   rosuvastatin 20 MG tablet Commonly known as:  CRESTOR Take 20 mg by mouth every evening.   traMADol 50 MG tablet Commonly known as:  ULTRAM Take by mouth every 6 (six) hours as needed.       Diagnostic Studies: Dg Chest 2 View  Result Date: 09/05/2016 CLINICAL DATA:  Preoperative examination. Patient for right shoulder replacement. EXAM: CHEST  2 VIEW COMPARISON:  PA and lateral chest 07/06/2015. FINDINGS: The lungs are clear. Heart size is normal. No pneumothorax or pleural effusion. Aortic atherosclerosis is noted. Surgical clips right chest wall are seen. Scoliosis is unchanged. IMPRESSION: No acute disease. Atherosclerosis. Electronically Signed   By: Inge Rise M.D.   On: 09/05/2016 09:42   Ct Shoulder Right Wo Contrast  Result Date: 09/03/2016 CLINICAL DATA:  Chronic right shoulder pain. History of surgery 2 years ago. History of breast cancer. EXAM: CT OF THE UPPER RIGHT EXTREMITY WITHOUT CONTRAST TECHNIQUE: Multidetector CT imaging of the upper right extremity was performed according to the standard protocol. COMPARISON:  None. FINDINGS: Bones/Joint/Cartilage The patient has advanced glenohumeral osteoarthritis. Prominent osteophyte is identified off of the medial margin of the humeral head. Minimal subchondral cyst formation in the glenoid is identified. There are areas of ossification of the superior and posterior glenoid labrum. No fracture is identified. The acromioclavicular joint has been debrided and the patient is status post acromioplasty. Small loose bony fragment is seen superior to the [facility] joint and incidentally noted. Ligaments Suboptimally assessed by CT. Muscles and Tendons No rotator cuff tear is seen. The supraspinatus tendon  appears thickened and somewhat low attenuating compatible with tendinopathy. No muscular atrophy or focal lesion is identified. Soft tissues Imaged lung parenchyma demonstrates mild atelectatic change. Imaged intrathoracic contents are otherwise unremarkable. IMPRESSION: Dominant finding is marked glenohumeral osteoarthritis. Status post acromioplasty and debridement of the [facility] joint without complicating feature. Likely supraspinatus tendinopathy without tear. Electronically Signed   By: Inge Rise M.D.   On: 09/03/2016 09:54   Dg Shoulder Right Port  Result Date: 09/12/2016 CLINICAL DATA:  Status post right total shoulder arthroplasty. EXAM: PORTABLE RIGHT SHOULDER COMPARISON:  Radiographs of September 05, 2016. FINDINGS: Right shoulder prosthesis appears to be well situated. No acute fracture or dislocation is noted. IMPRESSION: Status post right shoulder arthroplasty.  Electronically Signed   By: Marijo Conception, M.D.   On: 09/12/2016 10:56    Disposition: 01-Home or Self Care  Discharge Instructions    Call MD / Call 911    Complete by:  As directed    If you experience chest pain or shortness of breath, CALL 911 and be transported to the hospital emergency room.  If you develope a fever above 101 F, pus (white drainage) or increased drainage or redness at the wound, or calf pain, call your surgeon's office.   Constipation Prevention    Complete by:  As directed    Drink plenty of fluids.  Prune juice may be helpful.  You may use a stool softener, such as Colace (over the counter) 100 mg twice a day.  Use MiraLax (over the counter) for constipation as needed.   Diet - low sodium heart healthy    Complete by:  As directed    Increase activity slowly as tolerated    Complete by:  As directed       Follow-up Information    Nita Sells, MD. Schedule an appointment as soon as possible for a visit in 2 weeks.   Specialty:  Orthopedic Surgery Contact information: [address] [place] [address] [phone]            Signed: Grier Mitts 09/13/2016, 8:12 AM

## 2016-09-25 DIAGNOSIS — Z96611 Presence of right artificial shoulder joint: Secondary | ICD-10-CM | POA: Diagnosis not present

## 2016-09-25 DIAGNOSIS — Z471 Aftercare following joint replacement surgery: Secondary | ICD-10-CM | POA: Diagnosis not present

## 2016-09-25 DIAGNOSIS — M19011 Primary osteoarthritis, right shoulder: Secondary | ICD-10-CM | POA: Diagnosis not present

## 2016-10-23 DIAGNOSIS — M19011 Primary osteoarthritis, right shoulder: Secondary | ICD-10-CM | POA: Diagnosis not present

## 2016-10-24 DIAGNOSIS — M25561 Pain in right knee: Secondary | ICD-10-CM | POA: Diagnosis not present

## 2016-10-29 DIAGNOSIS — Z96611 Presence of right artificial shoulder joint: Secondary | ICD-10-CM | POA: Diagnosis not present

## 2016-10-29 DIAGNOSIS — M25611 Stiffness of right shoulder, not elsewhere classified: Secondary | ICD-10-CM | POA: Diagnosis not present

## 2016-10-29 DIAGNOSIS — M25511 Pain in right shoulder: Secondary | ICD-10-CM | POA: Diagnosis not present

## 2016-10-30 DIAGNOSIS — M25511 Pain in right shoulder: Secondary | ICD-10-CM | POA: Diagnosis not present

## 2016-10-30 DIAGNOSIS — Z96611 Presence of right artificial shoulder joint: Secondary | ICD-10-CM | POA: Diagnosis not present

## 2016-10-30 DIAGNOSIS — M25611 Stiffness of right shoulder, not elsewhere classified: Secondary | ICD-10-CM | POA: Diagnosis not present

## 2016-11-04 DIAGNOSIS — Z96611 Presence of right artificial shoulder joint: Secondary | ICD-10-CM | POA: Diagnosis not present

## 2016-11-04 DIAGNOSIS — M25511 Pain in right shoulder: Secondary | ICD-10-CM | POA: Diagnosis not present

## 2016-11-04 DIAGNOSIS — M25611 Stiffness of right shoulder, not elsewhere classified: Secondary | ICD-10-CM | POA: Diagnosis not present

## 2016-11-06 DIAGNOSIS — M25611 Stiffness of right shoulder, not elsewhere classified: Secondary | ICD-10-CM | POA: Diagnosis not present

## 2016-11-06 DIAGNOSIS — M25511 Pain in right shoulder: Secondary | ICD-10-CM | POA: Diagnosis not present

## 2016-11-06 DIAGNOSIS — Z96611 Presence of right artificial shoulder joint: Secondary | ICD-10-CM | POA: Diagnosis not present

## 2016-11-11 DIAGNOSIS — M25511 Pain in right shoulder: Secondary | ICD-10-CM | POA: Diagnosis not present

## 2016-11-11 DIAGNOSIS — Z96611 Presence of right artificial shoulder joint: Secondary | ICD-10-CM | POA: Diagnosis not present

## 2016-11-11 DIAGNOSIS — M25611 Stiffness of right shoulder, not elsewhere classified: Secondary | ICD-10-CM | POA: Diagnosis not present

## 2016-11-14 DIAGNOSIS — M25611 Stiffness of right shoulder, not elsewhere classified: Secondary | ICD-10-CM | POA: Diagnosis not present

## 2016-11-14 DIAGNOSIS — M25511 Pain in right shoulder: Secondary | ICD-10-CM | POA: Diagnosis not present

## 2016-11-14 DIAGNOSIS — Z96611 Presence of right artificial shoulder joint: Secondary | ICD-10-CM | POA: Diagnosis not present

## 2016-11-19 DIAGNOSIS — Z96611 Presence of right artificial shoulder joint: Secondary | ICD-10-CM | POA: Diagnosis not present

## 2016-11-19 DIAGNOSIS — M25611 Stiffness of right shoulder, not elsewhere classified: Secondary | ICD-10-CM | POA: Diagnosis not present

## 2016-11-19 DIAGNOSIS — M25511 Pain in right shoulder: Secondary | ICD-10-CM | POA: Diagnosis not present

## 2016-11-19 DIAGNOSIS — M25561 Pain in right knee: Secondary | ICD-10-CM | POA: Diagnosis not present

## 2016-11-21 DIAGNOSIS — M25611 Stiffness of right shoulder, not elsewhere classified: Secondary | ICD-10-CM | POA: Diagnosis not present

## 2016-11-21 DIAGNOSIS — Z96611 Presence of right artificial shoulder joint: Secondary | ICD-10-CM | POA: Diagnosis not present

## 2016-11-21 DIAGNOSIS — M25511 Pain in right shoulder: Secondary | ICD-10-CM | POA: Diagnosis not present

## 2016-11-25 DIAGNOSIS — Z96611 Presence of right artificial shoulder joint: Secondary | ICD-10-CM | POA: Diagnosis not present

## 2016-11-25 DIAGNOSIS — M25611 Stiffness of right shoulder, not elsewhere classified: Secondary | ICD-10-CM | POA: Diagnosis not present

## 2016-11-25 DIAGNOSIS — M25511 Pain in right shoulder: Secondary | ICD-10-CM | POA: Diagnosis not present

## 2016-12-03 DIAGNOSIS — M25511 Pain in right shoulder: Secondary | ICD-10-CM | POA: Diagnosis not present

## 2016-12-03 DIAGNOSIS — Z96611 Presence of right artificial shoulder joint: Secondary | ICD-10-CM | POA: Diagnosis not present

## 2016-12-03 DIAGNOSIS — M25611 Stiffness of right shoulder, not elsewhere classified: Secondary | ICD-10-CM | POA: Diagnosis not present

## 2016-12-05 DIAGNOSIS — M25611 Stiffness of right shoulder, not elsewhere classified: Secondary | ICD-10-CM | POA: Diagnosis not present

## 2016-12-05 DIAGNOSIS — Z96611 Presence of right artificial shoulder joint: Secondary | ICD-10-CM | POA: Diagnosis not present

## 2016-12-05 DIAGNOSIS — M25511 Pain in right shoulder: Secondary | ICD-10-CM | POA: Diagnosis not present

## 2016-12-06 DIAGNOSIS — Z9889 Other specified postprocedural states: Secondary | ICD-10-CM | POA: Diagnosis not present

## 2016-12-06 DIAGNOSIS — M25511 Pain in right shoulder: Secondary | ICD-10-CM | POA: Diagnosis not present

## 2016-12-09 DIAGNOSIS — Z96611 Presence of right artificial shoulder joint: Secondary | ICD-10-CM | POA: Diagnosis not present

## 2016-12-09 DIAGNOSIS — M25611 Stiffness of right shoulder, not elsewhere classified: Secondary | ICD-10-CM | POA: Diagnosis not present

## 2016-12-09 DIAGNOSIS — M25511 Pain in right shoulder: Secondary | ICD-10-CM | POA: Diagnosis not present

## 2016-12-11 DIAGNOSIS — Z96611 Presence of right artificial shoulder joint: Secondary | ICD-10-CM | POA: Diagnosis not present

## 2016-12-11 DIAGNOSIS — M25611 Stiffness of right shoulder, not elsewhere classified: Secondary | ICD-10-CM | POA: Diagnosis not present

## 2016-12-11 DIAGNOSIS — M25511 Pain in right shoulder: Secondary | ICD-10-CM | POA: Diagnosis not present

## 2016-12-12 DIAGNOSIS — R05 Cough: Secondary | ICD-10-CM | POA: Diagnosis not present

## 2016-12-16 DIAGNOSIS — M5410 Radiculopathy, site unspecified: Secondary | ICD-10-CM | POA: Diagnosis not present

## 2016-12-16 DIAGNOSIS — R05 Cough: Secondary | ICD-10-CM | POA: Diagnosis not present

## 2016-12-16 DIAGNOSIS — M62838 Other muscle spasm: Secondary | ICD-10-CM | POA: Diagnosis not present

## 2016-12-17 DIAGNOSIS — M25511 Pain in right shoulder: Secondary | ICD-10-CM | POA: Diagnosis not present

## 2016-12-17 DIAGNOSIS — Z96611 Presence of right artificial shoulder joint: Secondary | ICD-10-CM | POA: Diagnosis not present

## 2016-12-17 DIAGNOSIS — M25611 Stiffness of right shoulder, not elsewhere classified: Secondary | ICD-10-CM | POA: Diagnosis not present

## 2016-12-17 LAB — URINALYSIS, MICROSCOPIC (REFLEX)

## 2016-12-18 DIAGNOSIS — M545 Low back pain: Secondary | ICD-10-CM | POA: Diagnosis not present

## 2016-12-19 DIAGNOSIS — Z96611 Presence of right artificial shoulder joint: Secondary | ICD-10-CM | POA: Diagnosis not present

## 2016-12-19 DIAGNOSIS — M25611 Stiffness of right shoulder, not elsewhere classified: Secondary | ICD-10-CM | POA: Diagnosis not present

## 2016-12-19 DIAGNOSIS — M25511 Pain in right shoulder: Secondary | ICD-10-CM | POA: Diagnosis not present

## 2016-12-24 DIAGNOSIS — Z96611 Presence of right artificial shoulder joint: Secondary | ICD-10-CM | POA: Diagnosis not present

## 2016-12-24 DIAGNOSIS — M25511 Pain in right shoulder: Secondary | ICD-10-CM | POA: Diagnosis not present

## 2016-12-24 DIAGNOSIS — M25611 Stiffness of right shoulder, not elsewhere classified: Secondary | ICD-10-CM | POA: Diagnosis not present

## 2016-12-31 DIAGNOSIS — M25611 Stiffness of right shoulder, not elsewhere classified: Secondary | ICD-10-CM | POA: Diagnosis not present

## 2016-12-31 DIAGNOSIS — Z96611 Presence of right artificial shoulder joint: Secondary | ICD-10-CM | POA: Diagnosis not present

## 2016-12-31 DIAGNOSIS — M25511 Pain in right shoulder: Secondary | ICD-10-CM | POA: Diagnosis not present

## 2017-01-02 DIAGNOSIS — M25511 Pain in right shoulder: Secondary | ICD-10-CM | POA: Diagnosis not present

## 2017-01-02 DIAGNOSIS — Z96611 Presence of right artificial shoulder joint: Secondary | ICD-10-CM | POA: Diagnosis not present

## 2017-01-02 DIAGNOSIS — M25611 Stiffness of right shoulder, not elsewhere classified: Secondary | ICD-10-CM | POA: Diagnosis not present

## 2017-01-07 DIAGNOSIS — Z96611 Presence of right artificial shoulder joint: Secondary | ICD-10-CM | POA: Diagnosis not present

## 2017-01-07 DIAGNOSIS — M25611 Stiffness of right shoulder, not elsewhere classified: Secondary | ICD-10-CM | POA: Diagnosis not present

## 2017-01-07 DIAGNOSIS — M25511 Pain in right shoulder: Secondary | ICD-10-CM | POA: Diagnosis not present

## 2017-01-09 DIAGNOSIS — M25511 Pain in right shoulder: Secondary | ICD-10-CM | POA: Diagnosis not present

## 2017-01-09 DIAGNOSIS — Z96611 Presence of right artificial shoulder joint: Secondary | ICD-10-CM | POA: Diagnosis not present

## 2017-01-09 DIAGNOSIS — M25611 Stiffness of right shoulder, not elsewhere classified: Secondary | ICD-10-CM | POA: Diagnosis not present

## 2017-01-14 DIAGNOSIS — Z96611 Presence of right artificial shoulder joint: Secondary | ICD-10-CM | POA: Diagnosis not present

## 2017-01-14 DIAGNOSIS — I10 Essential (primary) hypertension: Secondary | ICD-10-CM | POA: Diagnosis not present

## 2017-01-14 DIAGNOSIS — R079 Chest pain, unspecified: Secondary | ICD-10-CM | POA: Diagnosis not present

## 2017-01-14 DIAGNOSIS — M25511 Pain in right shoulder: Secondary | ICD-10-CM | POA: Diagnosis not present

## 2017-01-14 DIAGNOSIS — E7800 Pure hypercholesterolemia, unspecified: Secondary | ICD-10-CM | POA: Diagnosis not present

## 2017-01-14 DIAGNOSIS — M25611 Stiffness of right shoulder, not elsewhere classified: Secondary | ICD-10-CM | POA: Diagnosis not present

## 2017-01-15 DIAGNOSIS — E7800 Pure hypercholesterolemia, unspecified: Secondary | ICD-10-CM | POA: Diagnosis not present

## 2017-01-15 DIAGNOSIS — R079 Chest pain, unspecified: Secondary | ICD-10-CM | POA: Diagnosis not present

## 2017-01-17 DIAGNOSIS — M5412 Radiculopathy, cervical region: Secondary | ICD-10-CM | POA: Diagnosis not present

## 2017-01-22 DIAGNOSIS — M25511 Pain in right shoulder: Secondary | ICD-10-CM | POA: Diagnosis not present

## 2017-01-22 DIAGNOSIS — Z96611 Presence of right artificial shoulder joint: Secondary | ICD-10-CM | POA: Diagnosis not present

## 2017-01-22 DIAGNOSIS — M25611 Stiffness of right shoulder, not elsewhere classified: Secondary | ICD-10-CM | POA: Diagnosis not present

## 2017-01-27 DIAGNOSIS — M542 Cervicalgia: Secondary | ICD-10-CM | POA: Diagnosis not present

## 2017-01-29 DIAGNOSIS — M25511 Pain in right shoulder: Secondary | ICD-10-CM | POA: Diagnosis not present

## 2017-01-29 DIAGNOSIS — Z96611 Presence of right artificial shoulder joint: Secondary | ICD-10-CM | POA: Diagnosis not present

## 2017-01-29 DIAGNOSIS — M25611 Stiffness of right shoulder, not elsewhere classified: Secondary | ICD-10-CM | POA: Diagnosis not present

## 2017-01-30 DIAGNOSIS — N390 Urinary tract infection, site not specified: Secondary | ICD-10-CM | POA: Diagnosis not present

## 2017-01-31 DIAGNOSIS — N390 Urinary tract infection, site not specified: Secondary | ICD-10-CM | POA: Diagnosis not present

## 2017-02-03 ENCOUNTER — Telehealth: Payer: Self-pay

## 2017-02-03 DIAGNOSIS — M47816 Spondylosis without myelopathy or radiculopathy, lumbar region: Secondary | ICD-10-CM | POA: Diagnosis not present

## 2017-02-03 DIAGNOSIS — M5412 Radiculopathy, cervical region: Secondary | ICD-10-CM | POA: Diagnosis not present

## 2017-02-03 NOTE — Telephone Encounter (Signed)
Patient called requesting appt with Dr. Alvy Bimler, she has not seen Dr. Alvy Bimler in a couple of years.

## 2017-02-04 NOTE — Telephone Encounter (Signed)
I will send scheduling msg 

## 2017-02-05 ENCOUNTER — Encounter: Payer: Self-pay | Admitting: Allergy and Immunology

## 2017-02-05 ENCOUNTER — Other Ambulatory Visit: Payer: Self-pay | Admitting: Allergy and Immunology

## 2017-02-05 ENCOUNTER — Ambulatory Visit (INDEPENDENT_AMBULATORY_CARE_PROVIDER_SITE_OTHER): Payer: PPO | Admitting: Allergy and Immunology

## 2017-02-05 VITALS — BP 126/72 | HR 67 | Temp 98.3°F | Resp 19 | Ht 65.5 in | Wt 236.0 lb

## 2017-02-05 DIAGNOSIS — J4520 Mild intermittent asthma, uncomplicated: Secondary | ICD-10-CM | POA: Diagnosis not present

## 2017-02-05 DIAGNOSIS — J3089 Other allergic rhinitis: Secondary | ICD-10-CM

## 2017-02-05 DIAGNOSIS — H1010 Acute atopic conjunctivitis, unspecified eye: Secondary | ICD-10-CM

## 2017-02-05 DIAGNOSIS — Z7722 Contact with and (suspected) exposure to environmental tobacco smoke (acute) (chronic): Secondary | ICD-10-CM

## 2017-02-05 DIAGNOSIS — H1045 Other chronic allergic conjunctivitis: Secondary | ICD-10-CM | POA: Diagnosis not present

## 2017-02-05 DIAGNOSIS — R6884 Jaw pain: Secondary | ICD-10-CM | POA: Diagnosis not present

## 2017-02-05 MED ORDER — MONTELUKAST SODIUM 10 MG PO TABS: 10.0000 mg | ORAL_TABLET | Freq: Every day | ORAL | 1 refills | Status: DC

## 2017-02-05 MED ORDER — PROAIR HFA 108 (90 BASE) MCG/ACT IN AERS: INHALATION_SPRAY | RESPIRATORY_TRACT | 1 refills | Status: DC

## 2017-02-05 MED ORDER — OLOPATADINE HCL 0.2 % OP SOLN: 1.0000 [drp] | Freq: Every day | OPHTHALMIC | 5 refills | Status: DC

## 2017-02-05 NOTE — Patient Instructions (Addendum)
  1. Allergen avoidance measures and eliminate all environmental tobacco smoke exposure  2. Treat and prevent inflammation:   A. OTC Nasacort / Rhinocort - one spray each nostril one time per day  B. Montelukast 10mg  - one tab one time per day  3. If needed:   A. Nasal saline  B. OTC Antihistamine  C. Pataday - one drop each eye one time per day  D. Provental HFA or similar 2 inhalations every 4-6 hours  4. Consider a course of immunotherapy  5. Return to clinic in 4 weeks or earlier if problem

## 2017-02-05 NOTE — Progress Notes (Signed)
Dear Marda Stalker,  Thank you for referring Cynthia Velasquez to the [facility] on 02/05/2017.   Below is a summation of this patient's evaluation and recommendations.  Thank you for your referral. I will keep you informed about this patient's response to treatment.   If you have any questions please do not hesitate to contact me.   Sincerely,  Jiles Prows, MD Allergy / Immunology [facility]   ______________________________________________________________________    NEW PATIENT NOTE  Referring Provider: Marda Stalker, PA-C Primary Provider: Kathyrn Lass, MD Date of office visit: 02/05/2017    Subjective:   Chief Complaint:  Cynthia Velasquez (DOB: Nov 24, 1950) is a 66 y.o. female who presents to the clinic on 02/05/2017 with a chief complaint of Allergic Rhinitis  (was given inhaler for allergy induced breathing difficulty. ) .     HPI: Cynthia Velasquez presents to this clinic in evaluation of allergic disease.  She has a 1-2 year history of nasal congestion and sneezing and itchy throat and postnasal drip and coughing along with itchy watery eyes that appears to occur on a perennial basis but flares during the spring time and appear to be precipitated by exposure to the outdoors, cat, dog, dust, and tobacco smoke exposure.  She has tried various antihistamines and some eyedrops from her eye doctor and nasal saline which have not resulted in good control of her symptoms.  She also occasionally develops cough associated with shortness of breath and chest tightness. This appears to occur at the peak of her allergy symptoms during the spring. She feels as though she has heard noises from her chest during these events. She never develops any ugly sputum production or chest pain with these events. She was given a short acting bronchodilator the spring and she used it a few times and she's not  really sure that it helped her very much. Her last use of this inhaler was April.  Past Medical History:  Diagnosis Date  . Anxiety   . Arthritis   . Bilateral breast cancer ([place]) 10/21/2013  . Breast cancer ([place])   . Cancer ([place])   . Depression   . Gallstones   . GERD (gastroesophageal reflux disease)    doesn't take any meds for this  . Headache    h/o migraines      . History of bladder infections   . History of hiatal hernia   . History of migraine   . Hyperlipidemia    takes Simvastatin daily  . Hypertension    takes Hyzaar  . Joint pain   . Joint swelling   . Lumbar stenosis   . Neuropathy 09/07/2013  . Pneumonia   . Pre-diabetes     Past Surgical History:  Procedure Laterality Date  . ABDOMINAL HYSTERECTOMY    . bone spur removed from left foot    . CHOLECYSTECTOMY    . COLONOSCOPY    . double mastectomy     . HERNIA REPAIR     umbilical  . right knee arthroscopy    . right shoulder arthroscopy    . surgery for hiatal hernia    . TONSILLECTOMY    . TOTAL HIP ARTHROPLASTY Left 02/12/2013  . TOTAL HIP ARTHROPLASTY Left 02/12/2013   Procedure: LEFT TOTAL HIP ARTHROPLASTY;  Surgeon: Kerin Salen, MD;  Location: [facility];  Service: Orthopedics;  Laterality: Left;  . TOTAL HIP ARTHROPLASTY Right 05/03/2013  Procedure: TOTAL HIP ARTHROPLASTY;  Surgeon: Kerin Salen, MD;  Location: [facility];  Service: Orthopedics;  Laterality: Right;  . TOTAL KNEE ARTHROPLASTY Right 02/27/2015   Procedure: TOTAL KNEE ARTHROPLASTY;  Surgeon: Frederik Pear, MD;  Location: [facility];  Service: Orthopedics;  Laterality: Right;  . TOTAL SHOULDER ARTHROPLASTY Right 09/12/2016   Procedure: RIGHT TOTAL SHOULDER ARTHROPLASTY;  Surgeon: Tania Ade, MD;  Location: [facility];  Service: Orthopedics;  Laterality: Right;  RIGHT TOTAL SHOULDER ARTHROPLASTY    Allergies as of 02/05/2017      Reactions   Oxycodone-acetaminophen Hives, Itching   Codeine Itching, Rash   Vicodin [hydrocodone-acetaminophen] Rash        Medication List      diphenhydrAMINE 25 MG tablet Commonly known as:  BENADRYL Take 25 mg by mouth daily as needed for itching or allergies.   docusate sodium 100 MG capsule Commonly known as:  COLACE Take 1 capsule (100 mg total) by mouth 3 (three) times daily as needed.   gabapentin 300 MG capsule Commonly known as:  NEURONTIN   losartan 100 MG tablet Commonly known as:  COZAAR TK 1 T PO D   metoprolol succinate 25 MG 24 hr tablet Commonly known as:  TOPROL-XL Take 25 mg by mouth daily.   nitroGLYCERIN 0.4 MG SL tablet Commonly known as:  NITROSTAT Place 0.4 mg under the tongue every 5 (five) minutes x 3 doses as needed for chest pain.   omeprazole 40 MG capsule Commonly known as:  PRILOSEC Take 40 mg by mouth daily.   PROAIR HFA 108 (90 Base) MCG/ACT inhaler Generic drug:  albuterol INL 2 PFS PO Q 6 H PRN   rosuvastatin 20 MG tablet Commonly known as:  CRESTOR Take 20 mg by mouth every evening.   sulfamethoxazole-trimethoprim 800-160 MG tablet Commonly known as:  BACTRIM DS,SEPTRA DS TK 1 T PO BID FOR 5 DAYS       Review of systems negative except as noted in HPI / PMHx or noted below:  Review of Systems  Constitutional: Negative.   HENT: Negative.   Eyes: Negative.   Respiratory: Negative.   Cardiovascular: Negative.   Gastrointestinal: Negative.   Genitourinary: Negative.   Musculoskeletal: Negative.   Skin: Negative.   Neurological: Negative.   Endo/Heme/Allergies: Negative.   Psychiatric/Behavioral: Negative.     Family History  Problem Relation Age of Onset  . Adopted: Yes  . Allergic rhinitis Neg Hx   . Angioedema Neg Hx   . Atopy Neg Hx   . Eczema Neg Hx   . Immunodeficiency Neg Hx   . Urticaria Neg Hx   . Asthma Neg Hx     Social History   Social History  . Marital status: Single    Spouse name: N/A  . Number of children: N/A  . Years of education: N/A   Occupational History  . Not on file.   Social History Main  Topics  . Smoking status: Never Smoker  . Smokeless tobacco: Never Used  . Alcohol use No     Comment: rare  . Drug use: No  . Sexual activity: No   Other Topics Concern  . Not on file   Social History Narrative  . No narrative on file    Environmental and Social history  Lives in a apartment with a dry environment, no animals located inside the household, no carpeting in the bedroom, no plastic on the bed or pillow, tobacco smoke with inside the household, and employment as  a Print production planner.  Objective:   Vitals:   02/05/17 0833  BP: 126/72  Pulse: 67  Resp: 19  Temp: 98.3 F (36.8 C)   Height: 5' 5.5" (166.4 cm) Weight: 236 lb (107 kg)  Physical Exam  Constitutional: She is well-developed, well-nourished, and in no distress.  HENT:  Head: Normocephalic. Head is without right periorbital erythema and without left periorbital erythema.  Right Ear: Tympanic membrane, external ear and ear canal normal.  Left Ear: Tympanic membrane, external ear and ear canal normal.  Nose: Mucosal edema present. No rhinorrhea.  Mouth/Throat: Uvula is midline, oropharynx is clear and moist and mucous membranes are normal. No oropharyngeal exudate.  Eyes: Conjunctivae and lids are normal. Pupils are equal, round, and reactive to light.  Neck: Trachea normal. No tracheal tenderness present. No tracheal deviation present. No thyromegaly present.  Cardiovascular: Normal rate, regular rhythm, S1 normal, S2 normal and normal heart sounds.   No murmur heard. Pulmonary/Chest: Effort normal and breath sounds normal. No stridor. No tachypnea. No respiratory distress. She has no wheezes. She has no rales. She exhibits no tenderness.  Abdominal: Soft. She exhibits no distension and no mass. There is no hepatosplenomegaly. There is no tenderness. There is no rebound and no guarding.  Musculoskeletal: She exhibits no edema or tenderness.  Lymphadenopathy:       Head (right side): No tonsillar  adenopathy present.       Head (left side): No tonsillar adenopathy present.    She has no cervical adenopathy.    She has no axillary adenopathy.  Neurological: She is alert. Gait normal.  Skin: No rash noted. She is not diaphoretic. No erythema. No pallor. Nails show no clubbing.  Psychiatric: Mood and affect normal.    Diagnostics: Allergy skin tests were performed. She demonstrated hypersensitivity to house dust mite  Spirometry was performed and demonstrated an FEV1 of 1.71 @ 84 % of predicted. Following the administration of nebulized albuterol her FEV1 did not improve.  Assessment and Plan:    1. Other allergic rhinitis   2. Seasonal allergic conjunctivitis   3. Asthma, mild intermittent, well-controlled   4. Second hand tobacco smoke exposure     1. Allergen avoidance measures and eliminate all environmental tobacco smoke exposure  2. Treat and prevent inflammation:   A. OTC Nasacort / Rhinocort - one spray each nostril one time per day  B. Montelukast 10mg  - one tab one time per day  3. If needed:   A. Nasal saline  B. OTC Antihistamine  C. Pataday - one drop each eye one time per day  D. Provental HFA or similar 2 inhalations every 4-6 hours  4. Consider a course of immunotherapy  5. Return to clinic in 4 weeks or earlier if problem  I will have Cynthia Velasquez utilize a combination of allergen avoidance measures and also eliminate all environmental tobacco smoke exposure at the same time that she consistently uses anti-inflammatory medications for her respiratory tract. Hopefully this will result in good control of her atopic disease. If not, she would definitely be a candidate for immunotherapy and I have given her literature on this form of therapy during today's visit. I will see her back in this clinic in 4 weeks or earlier if there is a problem.  Jiles Prows, MD [facility]

## 2017-02-13 ENCOUNTER — Encounter: Payer: Self-pay | Admitting: Allergy and Immunology

## 2017-02-17 DIAGNOSIS — R140 Abdominal distension (gaseous): Secondary | ICD-10-CM | POA: Diagnosis not present

## 2017-02-17 DIAGNOSIS — K5900 Constipation, unspecified: Secondary | ICD-10-CM | POA: Diagnosis not present

## 2017-02-18 DIAGNOSIS — M50121 Cervical disc disorder at C4-C5 level with radiculopathy: Secondary | ICD-10-CM | POA: Diagnosis not present

## 2017-02-18 DIAGNOSIS — M542 Cervicalgia: Secondary | ICD-10-CM | POA: Diagnosis not present

## 2017-02-21 DIAGNOSIS — M50121 Cervical disc disorder at C4-C5 level with radiculopathy: Secondary | ICD-10-CM | POA: Diagnosis not present

## 2017-02-21 DIAGNOSIS — M542 Cervicalgia: Secondary | ICD-10-CM | POA: Diagnosis not present

## 2017-02-25 DIAGNOSIS — M50121 Cervical disc disorder at C4-C5 level with radiculopathy: Secondary | ICD-10-CM | POA: Diagnosis not present

## 2017-02-25 DIAGNOSIS — M542 Cervicalgia: Secondary | ICD-10-CM | POA: Diagnosis not present

## 2017-02-27 DIAGNOSIS — M47816 Spondylosis without myelopathy or radiculopathy, lumbar region: Secondary | ICD-10-CM | POA: Diagnosis not present

## 2017-03-03 DIAGNOSIS — M542 Cervicalgia: Secondary | ICD-10-CM | POA: Diagnosis not present

## 2017-03-03 DIAGNOSIS — M50121 Cervical disc disorder at C4-C5 level with radiculopathy: Secondary | ICD-10-CM | POA: Diagnosis not present

## 2017-03-04 DIAGNOSIS — R140 Abdominal distension (gaseous): Secondary | ICD-10-CM | POA: Diagnosis not present

## 2017-03-04 DIAGNOSIS — K5900 Constipation, unspecified: Secondary | ICD-10-CM | POA: Diagnosis not present

## 2017-03-05 ENCOUNTER — Ambulatory Visit: Payer: PPO | Admitting: Allergy and Immunology

## 2017-03-06 DIAGNOSIS — M542 Cervicalgia: Secondary | ICD-10-CM | POA: Diagnosis not present

## 2017-03-06 DIAGNOSIS — R319 Hematuria, unspecified: Secondary | ICD-10-CM | POA: Diagnosis not present

## 2017-03-06 DIAGNOSIS — R110 Nausea: Secondary | ICD-10-CM | POA: Diagnosis not present

## 2017-03-06 DIAGNOSIS — E876 Hypokalemia: Secondary | ICD-10-CM | POA: Diagnosis not present

## 2017-03-06 DIAGNOSIS — R42 Dizziness and giddiness: Secondary | ICD-10-CM | POA: Diagnosis not present

## 2017-03-06 DIAGNOSIS — M50121 Cervical disc disorder at C4-C5 level with radiculopathy: Secondary | ICD-10-CM | POA: Diagnosis not present

## 2017-03-10 DIAGNOSIS — R42 Dizziness and giddiness: Secondary | ICD-10-CM | POA: Diagnosis not present

## 2017-03-10 DIAGNOSIS — I10 Essential (primary) hypertension: Secondary | ICD-10-CM | POA: Diagnosis not present

## 2017-03-10 DIAGNOSIS — D485 Neoplasm of uncertain behavior of skin: Secondary | ICD-10-CM | POA: Diagnosis not present

## 2017-03-10 DIAGNOSIS — F331 Major depressive disorder, recurrent, moderate: Secondary | ICD-10-CM | POA: Diagnosis not present

## 2017-03-10 DIAGNOSIS — R51 Headache: Secondary | ICD-10-CM | POA: Diagnosis not present

## 2017-03-10 DIAGNOSIS — Z0000 Encounter for general adult medical examination without abnormal findings: Secondary | ICD-10-CM | POA: Diagnosis not present

## 2017-03-10 DIAGNOSIS — Z96611 Presence of right artificial shoulder joint: Secondary | ICD-10-CM | POA: Diagnosis not present

## 2017-03-10 DIAGNOSIS — E6609 Other obesity due to excess calories: Secondary | ICD-10-CM | POA: Diagnosis not present

## 2017-03-10 DIAGNOSIS — Z23 Encounter for immunization: Secondary | ICD-10-CM | POA: Diagnosis not present

## 2017-03-10 DIAGNOSIS — R739 Hyperglycemia, unspecified: Secondary | ICD-10-CM | POA: Diagnosis not present

## 2017-03-10 DIAGNOSIS — K219 Gastro-esophageal reflux disease without esophagitis: Secondary | ICD-10-CM | POA: Diagnosis not present

## 2017-03-10 DIAGNOSIS — R310 Gross hematuria: Secondary | ICD-10-CM | POA: Diagnosis not present

## 2017-03-10 DIAGNOSIS — E7800 Pure hypercholesterolemia, unspecified: Secondary | ICD-10-CM | POA: Diagnosis not present

## 2017-03-10 DIAGNOSIS — Z471 Aftercare following joint replacement surgery: Secondary | ICD-10-CM | POA: Diagnosis not present

## 2017-03-11 ENCOUNTER — Ambulatory Visit (HOSPITAL_BASED_OUTPATIENT_CLINIC_OR_DEPARTMENT_OTHER): Payer: PPO | Admitting: Hematology and Oncology

## 2017-03-11 ENCOUNTER — Other Ambulatory Visit (HOSPITAL_BASED_OUTPATIENT_CLINIC_OR_DEPARTMENT_OTHER): Payer: PPO

## 2017-03-11 VITALS — BP 177/93 | HR 63 | Temp 99.0°F | Resp 18 | Ht 65.5 in | Wt 235.1 lb

## 2017-03-11 DIAGNOSIS — M79621 Pain in right upper arm: Secondary | ICD-10-CM | POA: Diagnosis not present

## 2017-03-11 DIAGNOSIS — M7989 Other specified soft tissue disorders: Secondary | ICD-10-CM

## 2017-03-11 DIAGNOSIS — Z853 Personal history of malignant neoplasm of breast: Secondary | ICD-10-CM

## 2017-03-11 DIAGNOSIS — R310 Gross hematuria: Secondary | ICD-10-CM

## 2017-03-11 DIAGNOSIS — C50412 Malignant neoplasm of upper-outer quadrant of left female breast: Secondary | ICD-10-CM

## 2017-03-11 DIAGNOSIS — M50121 Cervical disc disorder at C4-C5 level with radiculopathy: Secondary | ICD-10-CM | POA: Diagnosis not present

## 2017-03-11 DIAGNOSIS — C50411 Malignant neoplasm of upper-outer quadrant of right female breast: Secondary | ICD-10-CM

## 2017-03-11 DIAGNOSIS — M542 Cervicalgia: Secondary | ICD-10-CM | POA: Diagnosis not present

## 2017-03-11 LAB — COMPREHENSIVE METABOLIC PANEL
ALK PHOS: 111 U/L (ref 40–150)
ALT: 14 U/L (ref 0–55)
ANION GAP: 7 meq/L (ref 3–11)
AST: 16 U/L (ref 5–34)
Albumin: 3.5 g/dL (ref 3.5–5.0)
BILIRUBIN TOTAL: 0.37 mg/dL (ref 0.20–1.20)
BUN: 11.5 mg/dL (ref 7.0–26.0)
CALCIUM: 9.8 mg/dL (ref 8.4–10.4)
CO2: 28 meq/L (ref 22–29)
Chloride: 105 mEq/L (ref 98–109)
Creatinine: 0.8 mg/dL (ref 0.6–1.1)
EGFR: 84 mL/min/{1.73_m2} — AB (ref >90)
Glucose: 86 mg/dl (ref 70–140)
Potassium: 3.6 mEq/L (ref 3.5–5.1)
Sodium: 140 mEq/L (ref 136–145)
TOTAL PROTEIN: 7.2 g/dL (ref 6.4–8.3)

## 2017-03-11 LAB — CBC WITH DIFFERENTIAL/PLATELET
BASO%: 0.2 % (ref 0.0–2.0)
Basophils Absolute: 0 10*3/uL (ref 0.0–0.1)
EOS%: 2.1 % (ref 0.0–7.0)
Eosinophils Absolute: 0.2 10*3/uL (ref 0.0–0.5)
HCT: 40.7 % (ref 34.8–46.6)
HGB: 13 g/dL (ref 11.6–15.9)
LYMPH#: 2.8 10*3/uL (ref 0.9–3.3)
LYMPH%: 26.9 % (ref 14.0–49.7)
MCH: 26.1 pg (ref 25.1–34.0)
MCHC: 31.9 g/dL (ref 31.5–36.0)
MCV: 81.6 fL (ref 79.5–101.0)
MONO#: 0.8 10*3/uL (ref 0.1–0.9)
MONO%: 7.7 % (ref 0.0–14.0)
NEUT%: 63.1 % (ref 38.4–76.8)
NEUTROS ABS: 6.6 10*3/uL — AB (ref 1.5–6.5)
PLATELETS: 226 10*3/uL (ref 145–400)
RBC: 4.99 10*6/uL (ref 3.70–5.45)
RDW: 16.1 % — AB (ref 11.2–14.5)
WBC: 10.5 10*3/uL — AB (ref 3.9–10.3)

## 2017-03-11 LAB — URINALYSIS, MICROSCOPIC - CHCC
Bilirubin (Urine): NEGATIVE
GLUCOSE UR CHCC: NEGATIVE mg/dL
Ketones: NEGATIVE mg/dL
NITRITE: NEGATIVE
PH: 6.5 (ref 4.6–8.0)
PROTEIN: NEGATIVE mg/dL
Specific Gravity, Urine: 1.015 (ref 1.003–1.035)
UROBILINOGEN UR: 0.2 mg/dL (ref 0.2–1)

## 2017-03-12 ENCOUNTER — Encounter: Payer: Self-pay | Admitting: Hematology and Oncology

## 2017-03-12 ENCOUNTER — Telehealth: Payer: Self-pay

## 2017-03-12 LAB — URINE CULTURE: Organism ID, Bacteria: NO GROWTH

## 2017-03-12 NOTE — Progress Notes (Signed)
[facility] OFFICE PROGRESS NOTE  Patient Care Team: Kathyrn Lass, MD as PCP - General (Family Medicine)  SUMMARY OF ONCOLOGIC HISTORY: Oncology History   Breast cancer of upper-outer quadrant of right female breast, (ER/PR positive, her2/Neu negative)   Primary site: Breast (Right)   Staging method: AJCC 7th Edition   Clinical: Stage IA (T1a, N0, cM0) signed by [name], MD on 09/07/2013 11:14 AM   Pathologic: Stage IA (T1a, N0, cM0) signed by [name], MD on 09/07/2013 11:14 AM   Summary: Stage IA (T1a, N0, cM0)  Breast cancer of upper-outer quadrant of left female breast (ER/PR/Her2Neu negative)   Primary site: Breast (Left)   Staging method: AJCC 7th Edition   Clinical: Stage IIA (T2, N0, cM0) signed by [name], MD on 09/07/2013 11:35 AM   Pathologic: Stage IIA (T2, N0, cM0) signed by [name], MD on 09/07/2013 11:35 AM   Summary: Stage IIA (T2, N0, cM0)       Breast cancer of upper-outer quadrant of left female breast ([place])   07/10/2007 Procedure    US biopsy showed invasive ductal carcinoma 0.8cm, Nottingham grade 3      07/30/2007 Surgery    She had left breast lumpectomy and LN biopsy which showed high grade invasive ductal cancer Nottingham grade 3, 2.7 cm, ER/PR/Her2 neu negative      11/24/2007 Surgery    Patient elected for bilateral mastectomy      09/07/2008 - 03/08/2009 Chemotherapy    dates are approximate: she received adriamycin and cytoxan followed by Taxol      03/08/2009 - 05/08/2009 Radiation Therapy    dates are approximate, she received XRT        Breast cancer of upper-outer quadrant of right female breast ([place])   07/10/2006 Procedure    stereotactic biopsy showed atypical hyperplasia      10/23/2006 Surgery    right lumpectomy showed invasive ductal ca (Nottingham grade 1)  29m and DCIS, ER/PR positive Her 2 negative      10/09/2007 - 10/08/2012 Chemotherapy    Patient was placed on Tamoxifen       INTERVAL  HISTORY: Please see below for problem oriented charting. The patient has not been seen for over 2 years. She has new onset of symptoms and is concerned about possible cancer recurrence The patient had right shoulder surgery last year. She has started to notice some symptoms of right armpit swelling and pain around the right axillary line near her chest wall The pain comes and goes. She denies other new palpable mass on her chest wall Recently, she has noticed some gross hematuria that is painless She has been evaluated by primary care doctor which excluded urinary tract infection Urology appointment is pending She denies any suprapubic discomfort or bilateral flank pain. She denies recent nausea, changes in bowel habits, anorexia or weight loss  REVIEW OF SYSTEMS:   Constitutional: Denies fevers, chills or abnormal weight loss Eyes: Denies blurriness of vision Ears, nose, mouth, throat, and face: Denies mucositis or sore throat Respiratory: Denies cough, dyspnea or wheezes Cardiovascular: Denies palpitation, chest discomfort or lower extremity swelling Gastrointestinal:  Denies nausea, heartburn or change in bowel habits Skin: Denies abnormal skin rashes Lymphatics: Denies new lymphadenopathy or easy bruising Neurological:Denies numbness, tingling or new weaknesses Behavioral/Psych: Mood is stable, no new changes  All other systems were reviewed with the patient and are negative.  I have reviewed the past medical history, past surgical history, social history and family  history with the patient and they are unchanged from previous note.  ALLERGIES:  is allergic to oxycodone-acetaminophen; codeine; and vicodin [hydrocodone-acetaminophen].  MEDICATIONS:  Current Outpatient Prescriptions  Medication Sig Dispense Refill  . diphenhydrAMINE (BENADRYL) 25 MG tablet Take 25 mg by mouth daily as needed for itching or allergies.     [name] Kitchen docusate sodium (COLACE) 100 MG capsule Take 1 capsule  (100 mg total) by mouth 3 (three) times daily as needed. 20 capsule 0  . gabapentin (NEURONTIN) 300 MG capsule     . losartan (COZAAR) 100 MG tablet TK 1 T PO D  6  . metoprolol succinate (TOPROL-XL) 25 MG 24 hr tablet Take 25 mg by mouth daily.  3  . montelukast (SINGULAIR) 10 MG tablet TAKE 1 TABLET(10 MG) BY MOUTH AT BEDTIME 90 tablet 1  . nitroGLYCERIN (NITROSTAT) 0.4 MG SL tablet Place 0.4 mg under the tongue every 5 (five) minutes x 3 doses as needed for chest pain.  0  . Olopatadine HCl (PATADAY) 0.2 % SOLN Place 1 drop into both eyes daily. 1 Bottle 5  . omeprazole (PRILOSEC) 40 MG capsule Take 40 mg by mouth daily.  5  . Polyethyl Glycol-Propyl Glycol (SYSTANE ULTRA OP) Apply to eye.    [name] Kitchen PROAIR HFA 108 (90 Base) MCG/ACT inhaler 2 inhalations every 4-6 hours as needed. 18 g 1  . rosuvastatin (CRESTOR) 20 MG tablet Take 20 mg by mouth every evening.  3  . sulfamethoxazole-trimethoprim (BACTRIM DS,SEPTRA DS) 800-160 MG tablet TK 1 T PO BID FOR 5 DAYS  0   No current facility-administered medications for this visit.     PHYSICAL EXAMINATION: ECOG PERFORMANCE STATUS: 1 - Symptomatic but completely ambulatory  Vitals:   03/11/17 1515  BP: (!) 177/93  Pulse: 63  Resp: 18  Temp: 99 F (37.2 C)   Filed Weights   03/11/17 1515  Weight: 235 lb 1.6 oz (106.6 kg)    GENERAL:alert, no distress and comfortable.  She appears anxious SKIN: skin color, texture, turgor are normal, no rashes or significant lesions EYES: normal, Conjunctiva are pink and non-injected, sclera clear OROPHARYNX:no exudate, no erythema and lips, buccal mucosa, and tongue normal  NECK: supple, thyroid normal size, non-tender, without nodularity LYMPH:  no palpable lymphadenopathy in the cervical, axillary or inguinal LUNGS: clear to auscultation and percussion with normal breathing effort HEART: regular rate & rhythm and no murmurs and no lower extremity edema ABDOMEN:abdomen soft, non-tender and normal bowel  sounds Musculoskeletal:no cyanosis of digits and no clubbing  NEURO: alert & oriented x 3 with fluent speech, no focal motor/sensory deficits Bilateral chest wall examination revealed well-healed mastectomy scar.  I have noticed slight nodularity and soft tissue thickening over her right mastectomy scar consistent with prior surgery and radiation changes but malignancy cannot be excluded on exam  LABORATORY DATA:  I have reviewed the data as listed    Component Value Date/Time   NA 140 03/11/2017 1551   K 3.6 03/11/2017 1551   CL 106 09/13/2016 0538   CO2 28 03/11/2017 1551   GLUCOSE 86 03/11/2017 1551   BUN 11.5 03/11/2017 1551   CREATININE 0.8 03/11/2017 1551   CALCIUM 9.8 03/11/2017 1551   PROT 7.2 03/11/2017 1551   ALBUMIN 3.5 03/11/2017 1551   AST 16 03/11/2017 1551   ALT 14 03/11/2017 1551   ALKPHOS 111 03/11/2017 1551   BILITOT 0.37 03/11/2017 1551   GFRNONAA 48 (L) 09/13/2016 0538   GFRAA 56 (L) 09/13/2016 8757  No results found for: SPEP, UPEP  Lab Results  Component Value Date   WBC 10.5 (H) 03/11/2017   NEUTROABS 6.6 (H) 03/11/2017   HGB 13.0 03/11/2017   HCT 40.7 03/11/2017   MCV 81.6 03/11/2017   PLT 226 03/11/2017      Chemistry      Component Value Date/Time   NA 140 03/11/2017 1551   K 3.6 03/11/2017 1551   CL 106 09/13/2016 0538   CO2 28 03/11/2017 1551   BUN 11.5 03/11/2017 1551   CREATININE 0.8 03/11/2017 1551      Component Value Date/Time   CALCIUM 9.8 03/11/2017 1551   ALKPHOS 111 03/11/2017 1551   AST 16 03/11/2017 1551   ALT 14 03/11/2017 1551   BILITOT 0.37 03/11/2017 1551     ASSESSMENT & PLAN:  Breast cancer of upper-outer quadrant of right female breast ([place]) The patient has history of bilateral breast cancer status post mastectomy She is complaining of recent onset of right armpit swelling and pain around the right axillary line On examination, extensive scarring and radiation changes is noted on her chest wall It is  impossible to delineate whether these changes is related to prior treatment versus chest wall recurrence I recommend CT scan of the chest for evaluation to exclude metastatic disease and she agreed to proceed  Hematuria, gross The patient have new onset of gross hematuria.  She has been referred to urologist She is wondering whether it could represent metastatic cancer to her abdomen The patient had underwent genetic testing in the past that came back negative for inheritable genetic disorder She has completed prophylactic surgery with hysterectomy but that does not exclude the possibility of other malignancy such as primary kidney cancer Given her extensive cancer history, and recent onset of gross hematuria, I think it is appropriate to order CT scan of the abdomen and pelvis to exclude metastatic disease.  She agreed to proceed   Orders Placed This Encounter  Procedures  . Urine Culture    Standing Status:   Future    Number of Occurrences:   1    Standing Expiration Date:   04/15/2018  . CT CHEST W CONTRAST    Standing Status:   Future    Standing Expiration Date:   05/11/2018    Order Specific Question:   Reason for Exam (SYMPTOM  OR DIAGNOSIS REQUIRED)    Answer:   staging breast ca, palpable nodule chest wall, hematuria    Order Specific Question:   Preferred imaging location?    Answer:   [facility]  . CT ABDOMEN PELVIS W CONTRAST    Standing Status:   Future    Standing Expiration Date:   06/11/2018    Order Specific Question:   Reason for Exam (SYMPTOM  OR DIAGNOSIS REQUIRED)    Answer:   staging breast ca, palpable nodule chest wall, hematuria    Order Specific Question:   Preferred imaging location?    Answer:   [facility]  . CBC with Differential/Platelet    Standing Status:   Future    Number of Occurrences:   1    Standing Expiration Date:   04/15/2018  . Comprehensive metabolic panel    Standing Status:   Future    Number of Occurrences:   1     Standing Expiration Date:   04/15/2018  . Urinalysis, Microscopic - [facility]    Standing Status:   Future    Number of Occurrences:  1    Standing Expiration Date:   04/15/2018   All questions were answered. The patient knows to call the clinic with any problems, questions or concerns. No barriers to learning was detected. I spent 25 minutes counseling the patient face to face. The total time spent in the appointment was 30 minutes and more than 50% was on counseling and review of test results     [name], MD 03/12/2017 7:38 AM

## 2017-03-12 NOTE — Telephone Encounter (Signed)
-----   Message from Loletta Parish sent at 03/12/2017 11:04 AM EDT ----- Regarding: RE: CT to be done in 2 weeks Authorized.   Thanks  ----- Message ----- From: [name], MD Sent: 03/12/2017   7:40 AM To: Patton Salles, RN, Flo Shanks, RN, # Subject: CT to be done in 2 weeks

## 2017-03-12 NOTE — Assessment & Plan Note (Signed)
The patient have new onset of gross hematuria.  She has been referred to urologist She is wondering whether it could represent metastatic cancer to her abdomen The patient had underwent genetic testing in the past that came back negative for inheritable genetic disorder She has completed prophylactic surgery with hysterectomy but that does not exclude the possibility of other malignancy such as primary kidney cancer Given her extensive cancer history, and recent onset of gross hematuria, I think it is appropriate to order CT scan of the abdomen and pelvis to exclude metastatic disease.  She agreed to proceed

## 2017-03-12 NOTE — Telephone Encounter (Signed)
Called Radiology Scheduler , they will call patient and schedule.

## 2017-03-12 NOTE — Assessment & Plan Note (Signed)
The patient has history of bilateral breast cancer status post mastectomy She is complaining of recent onset of right armpit swelling and pain around the right axillary line On examination, extensive scarring and radiation changes is noted on her chest wall It is impossible to delineate whether these changes is related to prior treatment versus chest wall recurrence I recommend CT scan of the chest for evaluation to exclude metastatic disease and she agreed to proceed

## 2017-03-13 DIAGNOSIS — M50121 Cervical disc disorder at C4-C5 level with radiculopathy: Secondary | ICD-10-CM | POA: Diagnosis not present

## 2017-03-13 DIAGNOSIS — M542 Cervicalgia: Secondary | ICD-10-CM | POA: Diagnosis not present

## 2017-03-17 ENCOUNTER — Encounter: Payer: Self-pay | Admitting: Hematology and Oncology

## 2017-03-17 DIAGNOSIS — M542 Cervicalgia: Secondary | ICD-10-CM | POA: Diagnosis not present

## 2017-03-17 DIAGNOSIS — M50121 Cervical disc disorder at C4-C5 level with radiculopathy: Secondary | ICD-10-CM | POA: Diagnosis not present

## 2017-03-18 ENCOUNTER — Telehealth: Payer: Self-pay | Admitting: *Deleted

## 2017-03-18 ENCOUNTER — Encounter: Payer: Self-pay | Admitting: Hematology and Oncology

## 2017-03-18 NOTE — Telephone Encounter (Signed)
Reviewed My Chart questions and labs with patient.

## 2017-03-24 ENCOUNTER — Ambulatory Visit (HOSPITAL_COMMUNITY)
Admission: RE | Admit: 2017-03-24 | Discharge: 2017-03-24 | Disposition: A | Discharge status: Home / Self Care | Payer: PPO | Source: Ambulatory Visit | Attending: Hematology and Oncology | Admitting: Hematology and Oncology

## 2017-03-24 ENCOUNTER — Encounter (HOSPITAL_COMMUNITY): Payer: Self-pay

## 2017-03-24 ENCOUNTER — Telehealth: Payer: Self-pay | Admitting: *Deleted

## 2017-03-24 DIAGNOSIS — C50412 Malignant neoplasm of upper-outer quadrant of left female breast: Secondary | ICD-10-CM

## 2017-03-24 DIAGNOSIS — R918 Other nonspecific abnormal finding of lung field: Secondary | ICD-10-CM | POA: Diagnosis not present

## 2017-03-24 DIAGNOSIS — R310 Gross hematuria: Secondary | ICD-10-CM | POA: Diagnosis not present

## 2017-03-24 MED ORDER — IOPAMIDOL (ISOVUE-300) INJECTION 61%
100.0000 mL | Freq: Once | INTRAVENOUS | Status: AC | PRN
Start: 2017-03-24 — End: 2017-03-24
  Administered 2017-03-24: 100 mL via INTRAVENOUS

## 2017-03-24 MED ORDER — IOPAMIDOL (ISOVUE-300) INJECTION 61%
INTRAVENOUS | Status: AC
  Filled 2017-03-24: qty 100

## 2017-03-24 NOTE — Telephone Encounter (Signed)
-----   Message from [name], MD sent at 03/24/2017  8:57 AM EDT ----- Regarding: Ct neg pls let her know CT is negative Not clear the cause of hematuria, recommend follow-up with urology as scheduled No evidence of anomaly in the arm pit region No need return visit ----- Message ----- From: Interface, Rad Results In Sent: 03/24/2017   8:41 AM To: [name], MD

## 2017-03-24 NOTE — Telephone Encounter (Signed)
Left message to call Dr Gorsuch's nurse 

## 2017-04-18 DIAGNOSIS — L821 Other seborrheic keratosis: Secondary | ICD-10-CM | POA: Diagnosis not present

## 2017-05-13 DIAGNOSIS — Z780 Asymptomatic menopausal state: Secondary | ICD-10-CM | POA: Diagnosis not present

## 2017-05-13 DIAGNOSIS — M8588 Other specified disorders of bone density and structure, other site: Secondary | ICD-10-CM | POA: Diagnosis not present

## 2017-05-21 DIAGNOSIS — R350 Frequency of micturition: Secondary | ICD-10-CM | POA: Diagnosis not present

## 2017-05-21 DIAGNOSIS — R310 Gross hematuria: Secondary | ICD-10-CM | POA: Diagnosis not present

## 2017-05-21 DIAGNOSIS — M25561 Pain in right knee: Secondary | ICD-10-CM | POA: Diagnosis not present

## 2017-05-25 DIAGNOSIS — N390 Urinary tract infection, site not specified: Secondary | ICD-10-CM | POA: Diagnosis not present

## 2017-05-25 DIAGNOSIS — R319 Hematuria, unspecified: Secondary | ICD-10-CM | POA: Diagnosis not present

## 2017-05-30 DIAGNOSIS — M542 Cervicalgia: Secondary | ICD-10-CM | POA: Diagnosis not present

## 2017-05-30 DIAGNOSIS — M50121 Cervical disc disorder at C4-C5 level with radiculopathy: Secondary | ICD-10-CM | POA: Diagnosis not present

## 2017-06-02 DIAGNOSIS — K449 Diaphragmatic hernia without obstruction or gangrene: Secondary | ICD-10-CM | POA: Diagnosis not present

## 2017-06-02 DIAGNOSIS — R310 Gross hematuria: Secondary | ICD-10-CM | POA: Diagnosis not present

## 2017-06-04 DIAGNOSIS — M50121 Cervical disc disorder at C4-C5 level with radiculopathy: Secondary | ICD-10-CM | POA: Diagnosis not present

## 2017-06-04 DIAGNOSIS — M542 Cervicalgia: Secondary | ICD-10-CM | POA: Diagnosis not present

## 2017-06-06 DIAGNOSIS — M50121 Cervical disc disorder at C4-C5 level with radiculopathy: Secondary | ICD-10-CM | POA: Diagnosis not present

## 2017-06-06 DIAGNOSIS — M542 Cervicalgia: Secondary | ICD-10-CM | POA: Diagnosis not present

## 2017-06-10 DIAGNOSIS — M542 Cervicalgia: Secondary | ICD-10-CM | POA: Diagnosis not present

## 2017-06-10 DIAGNOSIS — M50121 Cervical disc disorder at C4-C5 level with radiculopathy: Secondary | ICD-10-CM | POA: Diagnosis not present

## 2017-06-11 DIAGNOSIS — M542 Cervicalgia: Secondary | ICD-10-CM | POA: Diagnosis not present

## 2017-06-11 DIAGNOSIS — M50121 Cervical disc disorder at C4-C5 level with radiculopathy: Secondary | ICD-10-CM | POA: Diagnosis not present

## 2017-06-17 DIAGNOSIS — M50121 Cervical disc disorder at C4-C5 level with radiculopathy: Secondary | ICD-10-CM | POA: Diagnosis not present

## 2017-06-17 DIAGNOSIS — M542 Cervicalgia: Secondary | ICD-10-CM | POA: Diagnosis not present

## 2017-06-19 DIAGNOSIS — M50121 Cervical disc disorder at C4-C5 level with radiculopathy: Secondary | ICD-10-CM | POA: Diagnosis not present

## 2017-06-19 DIAGNOSIS — M542 Cervicalgia: Secondary | ICD-10-CM | POA: Diagnosis not present

## 2017-06-24 DIAGNOSIS — M542 Cervicalgia: Secondary | ICD-10-CM | POA: Diagnosis not present

## 2017-06-24 DIAGNOSIS — M50121 Cervical disc disorder at C4-C5 level with radiculopathy: Secondary | ICD-10-CM | POA: Diagnosis not present

## 2017-06-27 DIAGNOSIS — M542 Cervicalgia: Secondary | ICD-10-CM | POA: Diagnosis not present

## 2017-06-27 DIAGNOSIS — M50121 Cervical disc disorder at C4-C5 level with radiculopathy: Secondary | ICD-10-CM | POA: Diagnosis not present

## 2017-07-01 DIAGNOSIS — M50121 Cervical disc disorder at C4-C5 level with radiculopathy: Secondary | ICD-10-CM | POA: Diagnosis not present

## 2017-07-01 DIAGNOSIS — M542 Cervicalgia: Secondary | ICD-10-CM | POA: Diagnosis not present

## 2017-07-03 DIAGNOSIS — M50121 Cervical disc disorder at C4-C5 level with radiculopathy: Secondary | ICD-10-CM | POA: Diagnosis not present

## 2017-07-03 DIAGNOSIS — M542 Cervicalgia: Secondary | ICD-10-CM | POA: Diagnosis not present

## 2017-07-04 DIAGNOSIS — G8918 Other acute postprocedural pain: Secondary | ICD-10-CM | POA: Diagnosis not present

## 2017-07-04 DIAGNOSIS — M7631 Iliotibial band syndrome, right leg: Secondary | ICD-10-CM | POA: Diagnosis not present

## 2017-07-04 DIAGNOSIS — M24661 Ankylosis, right knee: Secondary | ICD-10-CM | POA: Diagnosis not present

## 2017-07-04 DIAGNOSIS — Z96651 Presence of right artificial knee joint: Secondary | ICD-10-CM | POA: Diagnosis not present

## 2017-07-16 DIAGNOSIS — M7631 Iliotibial band syndrome, right leg: Secondary | ICD-10-CM | POA: Diagnosis not present

## 2017-08-01 DIAGNOSIS — Z853 Personal history of malignant neoplasm of breast: Secondary | ICD-10-CM | POA: Diagnosis not present

## 2017-08-01 DIAGNOSIS — Z9013 Acquired absence of bilateral breasts and nipples: Secondary | ICD-10-CM | POA: Diagnosis not present

## 2017-08-01 DIAGNOSIS — M26622 Arthralgia of left temporomandibular joint: Secondary | ICD-10-CM | POA: Diagnosis not present

## 2017-08-01 DIAGNOSIS — R0789 Other chest pain: Secondary | ICD-10-CM | POA: Diagnosis not present

## 2017-08-05 ENCOUNTER — Other Ambulatory Visit: Payer: Self-pay | Admitting: Family Medicine

## 2017-08-05 DIAGNOSIS — M2652 Limited mandibular range of motion: Secondary | ICD-10-CM | POA: Diagnosis not present

## 2017-08-05 DIAGNOSIS — R0789 Other chest pain: Secondary | ICD-10-CM

## 2017-08-05 DIAGNOSIS — M26623 Arthralgia of bilateral temporomandibular joint: Secondary | ICD-10-CM | POA: Diagnosis not present

## 2017-08-05 DIAGNOSIS — M2669 Other specified disorders of temporomandibular joint: Secondary | ICD-10-CM | POA: Diagnosis not present

## 2017-08-05 DIAGNOSIS — R51 Headache: Secondary | ICD-10-CM | POA: Diagnosis not present

## 2017-08-05 DIAGNOSIS — Z9013 Acquired absence of bilateral breasts and nipples: Secondary | ICD-10-CM

## 2017-08-05 DIAGNOSIS — Z853 Personal history of malignant neoplasm of breast: Secondary | ICD-10-CM

## 2017-08-05 DIAGNOSIS — M791 Myalgia, unspecified site: Secondary | ICD-10-CM | POA: Diagnosis not present

## 2017-08-12 DIAGNOSIS — M26623 Arthralgia of bilateral temporomandibular joint: Secondary | ICD-10-CM | POA: Diagnosis not present

## 2017-08-12 DIAGNOSIS — R51 Headache: Secondary | ICD-10-CM | POA: Diagnosis not present

## 2017-08-12 DIAGNOSIS — M2652 Limited mandibular range of motion: Secondary | ICD-10-CM | POA: Diagnosis not present

## 2017-08-12 DIAGNOSIS — M2669 Other specified disorders of temporomandibular joint: Secondary | ICD-10-CM | POA: Diagnosis not present

## 2017-08-12 DIAGNOSIS — M791 Myalgia, unspecified site: Secondary | ICD-10-CM | POA: Diagnosis not present

## 2017-08-13 DIAGNOSIS — Z9889 Other specified postprocedural states: Secondary | ICD-10-CM | POA: Diagnosis not present

## 2017-08-14 ENCOUNTER — Ambulatory Visit
Admission: RE | Admit: 2017-08-14 | Discharge: 2017-08-14 | Disposition: A | Discharge status: Home / Self Care | Payer: PRIVATE HEALTH INSURANCE | Source: Ambulatory Visit | Attending: Family Medicine | Admitting: Family Medicine

## 2017-08-14 DIAGNOSIS — R0781 Pleurodynia: Secondary | ICD-10-CM | POA: Diagnosis not present

## 2017-08-14 DIAGNOSIS — Z853 Personal history of malignant neoplasm of breast: Secondary | ICD-10-CM

## 2017-08-14 DIAGNOSIS — M2652 Limited mandibular range of motion: Secondary | ICD-10-CM | POA: Diagnosis not present

## 2017-08-14 DIAGNOSIS — R0789 Other chest pain: Secondary | ICD-10-CM

## 2017-08-14 DIAGNOSIS — M791 Myalgia, unspecified site: Secondary | ICD-10-CM | POA: Diagnosis not present

## 2017-08-14 DIAGNOSIS — M26623 Arthralgia of bilateral temporomandibular joint: Secondary | ICD-10-CM | POA: Diagnosis not present

## 2017-08-14 DIAGNOSIS — M2669 Other specified disorders of temporomandibular joint: Secondary | ICD-10-CM | POA: Diagnosis not present

## 2017-08-14 DIAGNOSIS — R51 Headache: Secondary | ICD-10-CM | POA: Diagnosis not present

## 2017-08-14 DIAGNOSIS — Z9013 Acquired absence of bilateral breasts and nipples: Secondary | ICD-10-CM

## 2017-08-14 MED ORDER — IOPAMIDOL (ISOVUE-300) INJECTION 61%: 75.0000 mL | Freq: Once | INTRAVENOUS | Status: DC | PRN

## 2017-08-19 DIAGNOSIS — M26623 Arthralgia of bilateral temporomandibular joint: Secondary | ICD-10-CM | POA: Diagnosis not present

## 2017-08-19 DIAGNOSIS — M2669 Other specified disorders of temporomandibular joint: Secondary | ICD-10-CM | POA: Diagnosis not present

## 2017-08-19 DIAGNOSIS — M2652 Limited mandibular range of motion: Secondary | ICD-10-CM | POA: Diagnosis not present

## 2017-08-19 DIAGNOSIS — R51 Headache: Secondary | ICD-10-CM | POA: Diagnosis not present

## 2017-08-21 DIAGNOSIS — M2652 Limited mandibular range of motion: Secondary | ICD-10-CM | POA: Diagnosis not present

## 2017-08-21 DIAGNOSIS — M2669 Other specified disorders of temporomandibular joint: Secondary | ICD-10-CM | POA: Diagnosis not present

## 2017-08-21 DIAGNOSIS — M26623 Arthralgia of bilateral temporomandibular joint: Secondary | ICD-10-CM | POA: Diagnosis not present

## 2017-08-21 DIAGNOSIS — R51 Headache: Secondary | ICD-10-CM | POA: Diagnosis not present

## 2017-09-04 DIAGNOSIS — M2652 Limited mandibular range of motion: Secondary | ICD-10-CM | POA: Diagnosis not present

## 2017-09-04 DIAGNOSIS — M2669 Other specified disorders of temporomandibular joint: Secondary | ICD-10-CM | POA: Diagnosis not present

## 2017-09-04 DIAGNOSIS — M26623 Arthralgia of bilateral temporomandibular joint: Secondary | ICD-10-CM | POA: Diagnosis not present

## 2017-09-04 DIAGNOSIS — R51 Headache: Secondary | ICD-10-CM | POA: Diagnosis not present

## 2017-09-11 DIAGNOSIS — M2669 Other specified disorders of temporomandibular joint: Secondary | ICD-10-CM | POA: Diagnosis not present

## 2017-09-11 DIAGNOSIS — M26623 Arthralgia of bilateral temporomandibular joint: Secondary | ICD-10-CM | POA: Diagnosis not present

## 2017-09-11 DIAGNOSIS — M2652 Limited mandibular range of motion: Secondary | ICD-10-CM | POA: Diagnosis not present

## 2017-09-11 DIAGNOSIS — R51 Headache: Secondary | ICD-10-CM | POA: Diagnosis not present

## 2017-09-18 DIAGNOSIS — M2669 Other specified disorders of temporomandibular joint: Secondary | ICD-10-CM | POA: Diagnosis not present

## 2017-09-18 DIAGNOSIS — M26623 Arthralgia of bilateral temporomandibular joint: Secondary | ICD-10-CM | POA: Diagnosis not present

## 2017-09-18 DIAGNOSIS — R51 Headache: Secondary | ICD-10-CM | POA: Diagnosis not present

## 2017-09-18 DIAGNOSIS — M2652 Limited mandibular range of motion: Secondary | ICD-10-CM | POA: Diagnosis not present

## 2017-09-19 DIAGNOSIS — M2669 Other specified disorders of temporomandibular joint: Secondary | ICD-10-CM | POA: Diagnosis not present

## 2017-09-19 DIAGNOSIS — R51 Headache: Secondary | ICD-10-CM | POA: Diagnosis not present

## 2017-09-19 DIAGNOSIS — M2652 Limited mandibular range of motion: Secondary | ICD-10-CM | POA: Diagnosis not present

## 2017-09-19 DIAGNOSIS — M26623 Arthralgia of bilateral temporomandibular joint: Secondary | ICD-10-CM | POA: Diagnosis not present

## 2017-09-22 DIAGNOSIS — Z471 Aftercare following joint replacement surgery: Secondary | ICD-10-CM | POA: Diagnosis not present

## 2017-09-22 DIAGNOSIS — M25511 Pain in right shoulder: Secondary | ICD-10-CM | POA: Diagnosis not present

## 2017-09-22 DIAGNOSIS — Z96611 Presence of right artificial shoulder joint: Secondary | ICD-10-CM | POA: Diagnosis not present

## 2017-09-23 DIAGNOSIS — M25561 Pain in right knee: Secondary | ICD-10-CM | POA: Diagnosis not present

## 2017-09-25 DIAGNOSIS — M2652 Limited mandibular range of motion: Secondary | ICD-10-CM | POA: Diagnosis not present

## 2017-09-25 DIAGNOSIS — M2669 Other specified disorders of temporomandibular joint: Secondary | ICD-10-CM | POA: Diagnosis not present

## 2017-09-25 DIAGNOSIS — R51 Headache: Secondary | ICD-10-CM | POA: Diagnosis not present

## 2017-09-25 DIAGNOSIS — M26623 Arthralgia of bilateral temporomandibular joint: Secondary | ICD-10-CM | POA: Diagnosis not present

## 2017-09-30 DIAGNOSIS — M2652 Limited mandibular range of motion: Secondary | ICD-10-CM | POA: Diagnosis not present

## 2017-09-30 DIAGNOSIS — M2669 Other specified disorders of temporomandibular joint: Secondary | ICD-10-CM | POA: Diagnosis not present

## 2017-09-30 DIAGNOSIS — R51 Headache: Secondary | ICD-10-CM | POA: Diagnosis not present

## 2017-09-30 DIAGNOSIS — M26623 Arthralgia of bilateral temporomandibular joint: Secondary | ICD-10-CM | POA: Diagnosis not present

## 2017-10-02 DIAGNOSIS — R51 Headache: Secondary | ICD-10-CM | POA: Diagnosis not present

## 2017-10-02 DIAGNOSIS — M2652 Limited mandibular range of motion: Secondary | ICD-10-CM | POA: Diagnosis not present

## 2017-10-02 DIAGNOSIS — M26623 Arthralgia of bilateral temporomandibular joint: Secondary | ICD-10-CM | POA: Diagnosis not present

## 2017-10-02 DIAGNOSIS — M2669 Other specified disorders of temporomandibular joint: Secondary | ICD-10-CM | POA: Diagnosis not present

## 2017-10-04 DIAGNOSIS — B351 Tinea unguium: Secondary | ICD-10-CM | POA: Diagnosis not present

## 2017-10-04 DIAGNOSIS — N390 Urinary tract infection, site not specified: Secondary | ICD-10-CM | POA: Diagnosis not present

## 2017-10-07 DIAGNOSIS — M2652 Limited mandibular range of motion: Secondary | ICD-10-CM | POA: Diagnosis not present

## 2017-10-07 DIAGNOSIS — M2669 Other specified disorders of temporomandibular joint: Secondary | ICD-10-CM | POA: Diagnosis not present

## 2017-10-07 DIAGNOSIS — R51 Headache: Secondary | ICD-10-CM | POA: Diagnosis not present

## 2017-10-07 DIAGNOSIS — M26623 Arthralgia of bilateral temporomandibular joint: Secondary | ICD-10-CM | POA: Diagnosis not present

## 2017-10-09 DIAGNOSIS — M2652 Limited mandibular range of motion: Secondary | ICD-10-CM | POA: Diagnosis not present

## 2017-10-09 DIAGNOSIS — R51 Headache: Secondary | ICD-10-CM | POA: Diagnosis not present

## 2017-10-09 DIAGNOSIS — M26623 Arthralgia of bilateral temporomandibular joint: Secondary | ICD-10-CM | POA: Diagnosis not present

## 2017-10-09 DIAGNOSIS — M2669 Other specified disorders of temporomandibular joint: Secondary | ICD-10-CM | POA: Diagnosis not present

## 2017-10-14 DIAGNOSIS — M26623 Arthralgia of bilateral temporomandibular joint: Secondary | ICD-10-CM | POA: Diagnosis not present

## 2017-10-14 DIAGNOSIS — M2669 Other specified disorders of temporomandibular joint: Secondary | ICD-10-CM | POA: Diagnosis not present

## 2017-10-14 DIAGNOSIS — R51 Headache: Secondary | ICD-10-CM | POA: Diagnosis not present

## 2017-10-14 DIAGNOSIS — M2652 Limited mandibular range of motion: Secondary | ICD-10-CM | POA: Diagnosis not present

## 2017-10-20 DIAGNOSIS — M25561 Pain in right knee: Secondary | ICD-10-CM | POA: Diagnosis not present

## 2017-10-20 DIAGNOSIS — M6259 Muscle wasting and atrophy, not elsewhere classified, multiple sites: Secondary | ICD-10-CM | POA: Diagnosis not present

## 2017-10-20 DIAGNOSIS — M25661 Stiffness of right knee, not elsewhere classified: Secondary | ICD-10-CM | POA: Diagnosis not present

## 2017-10-20 DIAGNOSIS — Z471 Aftercare following joint replacement surgery: Secondary | ICD-10-CM | POA: Diagnosis not present

## 2017-10-24 DIAGNOSIS — M2652 Limited mandibular range of motion: Secondary | ICD-10-CM | POA: Diagnosis not present

## 2017-10-24 DIAGNOSIS — M2669 Other specified disorders of temporomandibular joint: Secondary | ICD-10-CM | POA: Diagnosis not present

## 2017-10-24 DIAGNOSIS — M26623 Arthralgia of bilateral temporomandibular joint: Secondary | ICD-10-CM | POA: Diagnosis not present

## 2017-10-24 DIAGNOSIS — R51 Headache: Secondary | ICD-10-CM | POA: Diagnosis not present

## 2017-10-28 DIAGNOSIS — M6259 Muscle wasting and atrophy, not elsewhere classified, multiple sites: Secondary | ICD-10-CM | POA: Diagnosis not present

## 2017-10-28 DIAGNOSIS — M25661 Stiffness of right knee, not elsewhere classified: Secondary | ICD-10-CM | POA: Diagnosis not present

## 2017-10-28 DIAGNOSIS — Z471 Aftercare following joint replacement surgery: Secondary | ICD-10-CM | POA: Diagnosis not present

## 2017-10-28 DIAGNOSIS — M25561 Pain in right knee: Secondary | ICD-10-CM | POA: Diagnosis not present

## 2017-10-30 DIAGNOSIS — Z471 Aftercare following joint replacement surgery: Secondary | ICD-10-CM | POA: Diagnosis not present

## 2017-10-30 DIAGNOSIS — M25561 Pain in right knee: Secondary | ICD-10-CM | POA: Diagnosis not present

## 2017-10-30 DIAGNOSIS — M6259 Muscle wasting and atrophy, not elsewhere classified, multiple sites: Secondary | ICD-10-CM | POA: Diagnosis not present

## 2017-10-30 DIAGNOSIS — M25661 Stiffness of right knee, not elsewhere classified: Secondary | ICD-10-CM | POA: Diagnosis not present

## 2017-11-05 DIAGNOSIS — K5901 Slow transit constipation: Secondary | ICD-10-CM | POA: Diagnosis not present

## 2017-11-05 DIAGNOSIS — R1013 Epigastric pain: Secondary | ICD-10-CM | POA: Diagnosis not present

## 2017-11-06 DIAGNOSIS — Z471 Aftercare following joint replacement surgery: Secondary | ICD-10-CM | POA: Diagnosis not present

## 2017-11-06 DIAGNOSIS — M25561 Pain in right knee: Secondary | ICD-10-CM | POA: Diagnosis not present

## 2017-11-06 DIAGNOSIS — M25661 Stiffness of right knee, not elsewhere classified: Secondary | ICD-10-CM | POA: Diagnosis not present

## 2017-11-06 DIAGNOSIS — M6259 Muscle wasting and atrophy, not elsewhere classified, multiple sites: Secondary | ICD-10-CM | POA: Diagnosis not present

## 2017-11-07 DIAGNOSIS — M25561 Pain in right knee: Secondary | ICD-10-CM | POA: Diagnosis not present

## 2017-11-07 DIAGNOSIS — M25661 Stiffness of right knee, not elsewhere classified: Secondary | ICD-10-CM | POA: Diagnosis not present

## 2017-11-07 DIAGNOSIS — Z471 Aftercare following joint replacement surgery: Secondary | ICD-10-CM | POA: Diagnosis not present

## 2017-11-07 DIAGNOSIS — M6259 Muscle wasting and atrophy, not elsewhere classified, multiple sites: Secondary | ICD-10-CM | POA: Diagnosis not present

## 2017-11-11 DIAGNOSIS — M6259 Muscle wasting and atrophy, not elsewhere classified, multiple sites: Secondary | ICD-10-CM | POA: Diagnosis not present

## 2017-11-11 DIAGNOSIS — Z471 Aftercare following joint replacement surgery: Secondary | ICD-10-CM | POA: Diagnosis not present

## 2017-11-11 DIAGNOSIS — M25661 Stiffness of right knee, not elsewhere classified: Secondary | ICD-10-CM | POA: Diagnosis not present

## 2017-11-11 DIAGNOSIS — M25561 Pain in right knee: Secondary | ICD-10-CM | POA: Diagnosis not present

## 2017-11-14 DIAGNOSIS — M6259 Muscle wasting and atrophy, not elsewhere classified, multiple sites: Secondary | ICD-10-CM | POA: Diagnosis not present

## 2017-11-14 DIAGNOSIS — Z471 Aftercare following joint replacement surgery: Secondary | ICD-10-CM | POA: Diagnosis not present

## 2017-11-14 DIAGNOSIS — M25561 Pain in right knee: Secondary | ICD-10-CM | POA: Diagnosis not present

## 2017-11-14 DIAGNOSIS — M25661 Stiffness of right knee, not elsewhere classified: Secondary | ICD-10-CM | POA: Diagnosis not present

## 2017-11-18 DIAGNOSIS — Z471 Aftercare following joint replacement surgery: Secondary | ICD-10-CM | POA: Diagnosis not present

## 2017-11-18 DIAGNOSIS — M25561 Pain in right knee: Secondary | ICD-10-CM | POA: Diagnosis not present

## 2017-11-18 DIAGNOSIS — M25661 Stiffness of right knee, not elsewhere classified: Secondary | ICD-10-CM | POA: Diagnosis not present

## 2017-11-18 DIAGNOSIS — M6259 Muscle wasting and atrophy, not elsewhere classified, multiple sites: Secondary | ICD-10-CM | POA: Diagnosis not present

## 2017-11-21 DIAGNOSIS — R1013 Epigastric pain: Secondary | ICD-10-CM | POA: Diagnosis not present

## 2017-11-25 DIAGNOSIS — M25561 Pain in right knee: Secondary | ICD-10-CM | POA: Diagnosis not present

## 2017-11-25 DIAGNOSIS — M25661 Stiffness of right knee, not elsewhere classified: Secondary | ICD-10-CM | POA: Diagnosis not present

## 2017-11-25 DIAGNOSIS — Z471 Aftercare following joint replacement surgery: Secondary | ICD-10-CM | POA: Diagnosis not present

## 2017-11-25 DIAGNOSIS — M6259 Muscle wasting and atrophy, not elsewhere classified, multiple sites: Secondary | ICD-10-CM | POA: Diagnosis not present

## 2017-12-02 DIAGNOSIS — M6259 Muscle wasting and atrophy, not elsewhere classified, multiple sites: Secondary | ICD-10-CM | POA: Diagnosis not present

## 2017-12-02 DIAGNOSIS — Z471 Aftercare following joint replacement surgery: Secondary | ICD-10-CM | POA: Diagnosis not present

## 2017-12-02 DIAGNOSIS — M25561 Pain in right knee: Secondary | ICD-10-CM | POA: Diagnosis not present

## 2017-12-02 DIAGNOSIS — M25661 Stiffness of right knee, not elsewhere classified: Secondary | ICD-10-CM | POA: Diagnosis not present

## 2017-12-04 DIAGNOSIS — M25661 Stiffness of right knee, not elsewhere classified: Secondary | ICD-10-CM | POA: Diagnosis not present

## 2017-12-04 DIAGNOSIS — M6259 Muscle wasting and atrophy, not elsewhere classified, multiple sites: Secondary | ICD-10-CM | POA: Diagnosis not present

## 2017-12-04 DIAGNOSIS — Z471 Aftercare following joint replacement surgery: Secondary | ICD-10-CM | POA: Diagnosis not present

## 2017-12-04 DIAGNOSIS — M25561 Pain in right knee: Secondary | ICD-10-CM | POA: Diagnosis not present

## 2017-12-11 DIAGNOSIS — M545 Low back pain: Secondary | ICD-10-CM | POA: Diagnosis not present

## 2017-12-11 DIAGNOSIS — M5441 Lumbago with sciatica, right side: Secondary | ICD-10-CM | POA: Diagnosis not present

## 2017-12-16 DIAGNOSIS — M6259 Muscle wasting and atrophy, not elsewhere classified, multiple sites: Secondary | ICD-10-CM | POA: Diagnosis not present

## 2017-12-16 DIAGNOSIS — M25561 Pain in right knee: Secondary | ICD-10-CM | POA: Diagnosis not present

## 2017-12-16 DIAGNOSIS — Z471 Aftercare following joint replacement surgery: Secondary | ICD-10-CM | POA: Diagnosis not present

## 2017-12-16 DIAGNOSIS — M25661 Stiffness of right knee, not elsewhere classified: Secondary | ICD-10-CM | POA: Diagnosis not present

## 2017-12-22 DIAGNOSIS — M5416 Radiculopathy, lumbar region: Secondary | ICD-10-CM | POA: Diagnosis not present

## 2018-01-06 DIAGNOSIS — M5416 Radiculopathy, lumbar region: Secondary | ICD-10-CM | POA: Diagnosis not present

## 2018-01-17 DIAGNOSIS — M47816 Spondylosis without myelopathy or radiculopathy, lumbar region: Secondary | ICD-10-CM | POA: Diagnosis not present

## 2018-01-17 DIAGNOSIS — R109 Unspecified abdominal pain: Secondary | ICD-10-CM | POA: Diagnosis not present

## 2018-01-29 DIAGNOSIS — M5416 Radiculopathy, lumbar region: Secondary | ICD-10-CM | POA: Diagnosis not present

## 2018-02-10 IMAGING — CT CT ABD-PELV W/ CM
2 of 5 series · 15 of 46 positions shown, 17 images · IV contrast (ISOVUE 300)
Comparison: Chest CT only on 08/17/2013 ; no prior AP CT

CLINICAL DATA: Left upper outer quadrant breast carcinoma. Palpable
chest wall mass. Gross hematuria. Pelvic pain and nausea for 1
month.

EXAM:
CT CHEST, ABDOMEN, AND PELVIS WITH CONTRAST
TECHNIQUE: Multidetector CT imaging of the chest, abdomen and pelvis was
performed following the standard protocol during bolus
administration of intravenous contrast.
CONTRAST:  100mL [id number] IOPAMIDOL (JOWX22-VVV) INJECTION 61%

[Series 2: cap with · axial · 0.80mm/px · z∈[+904,+1429]mm · 12 of 125 slices shown, 14 images]
[im 10/125  soft-tissue]
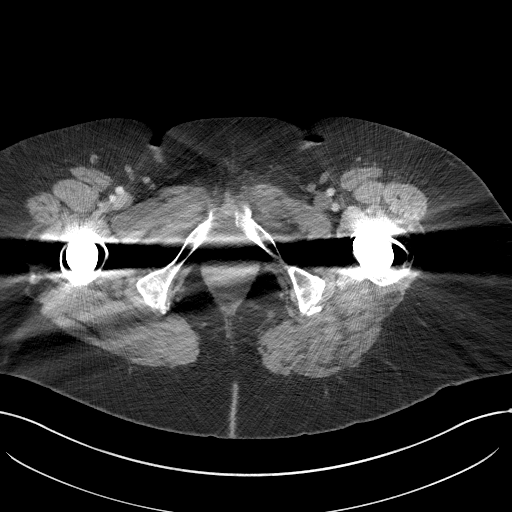
[im 10/125  bone]
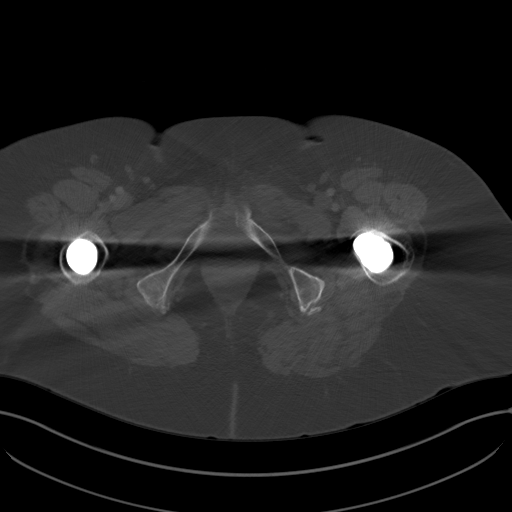
[im 20/125  soft-tissue]
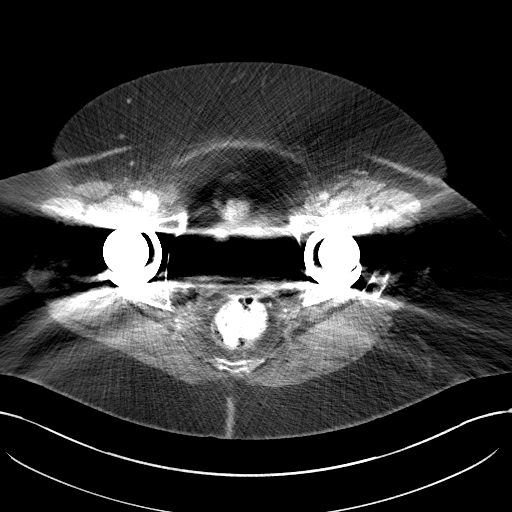
[im 29/125  soft-tissue]
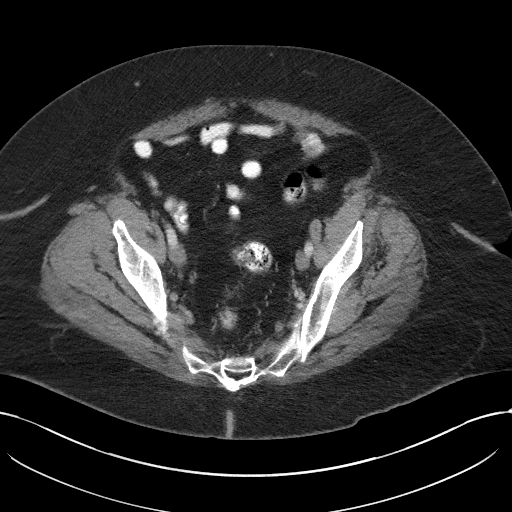
[im 39/125  soft-tissue]
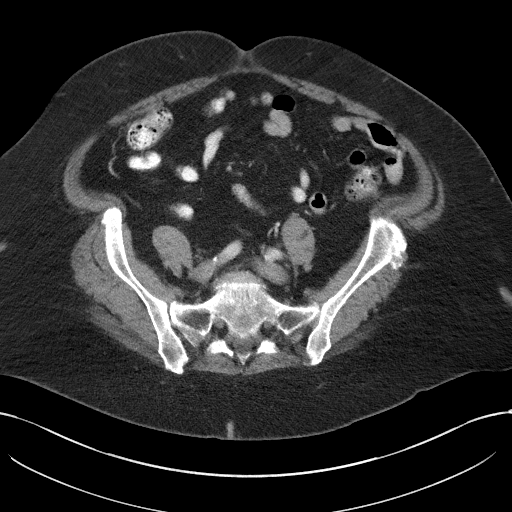
[im 48/125  soft-tissue]
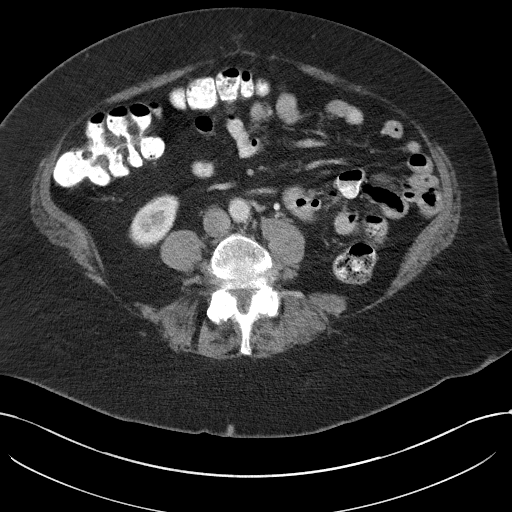
[im 58/125  soft-tissue]
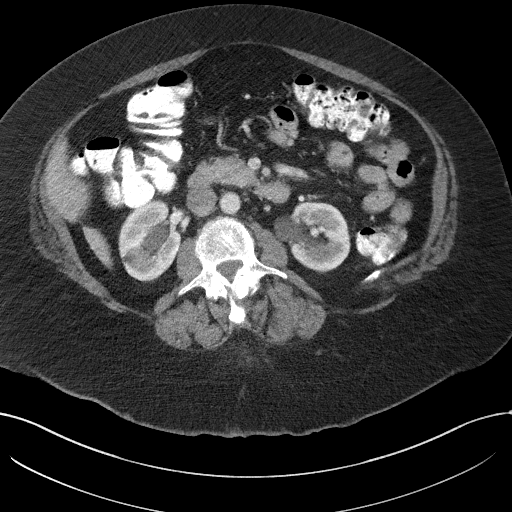
[im 67/125  soft-tissue]
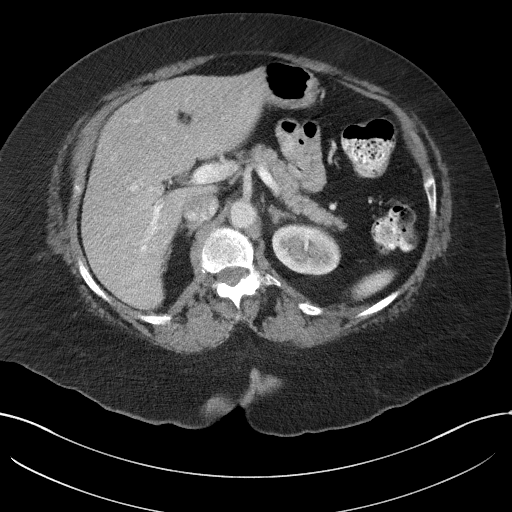
[im 77/125  soft-tissue]
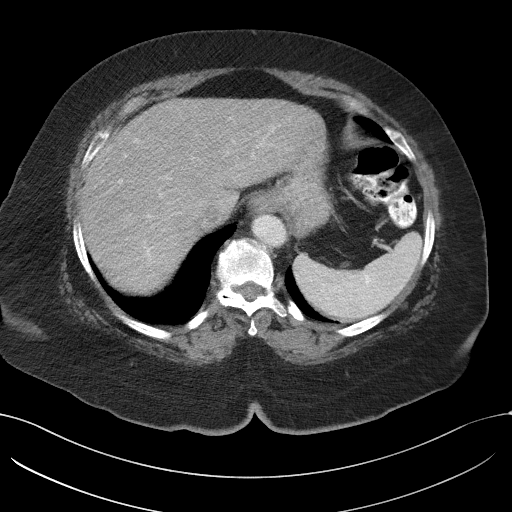
[im 86/125  soft-tissue]
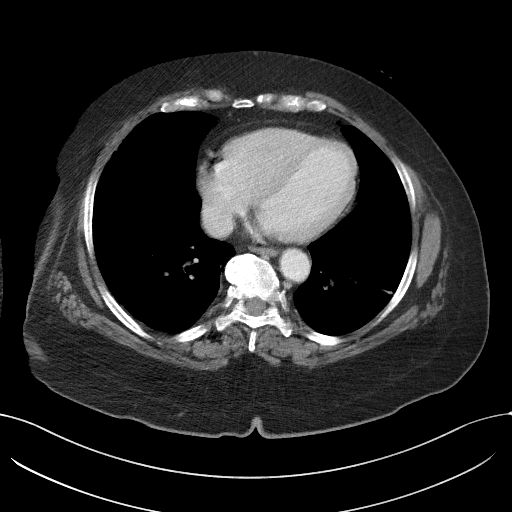
[im 86/125  bone]
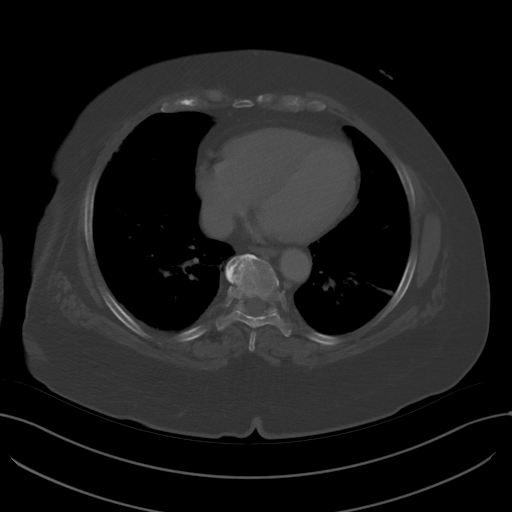
[im 96/125  soft-tissue]
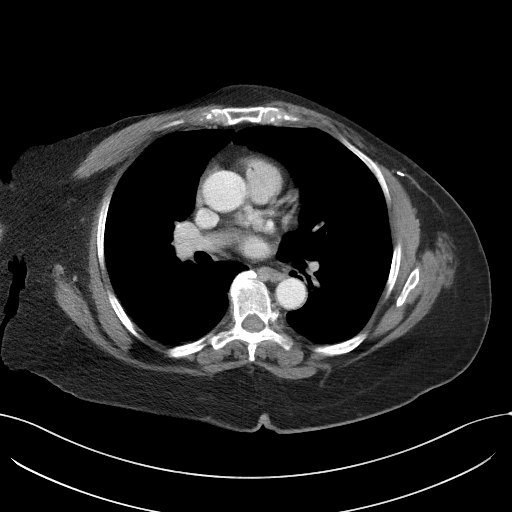
[im 105/125  soft-tissue]
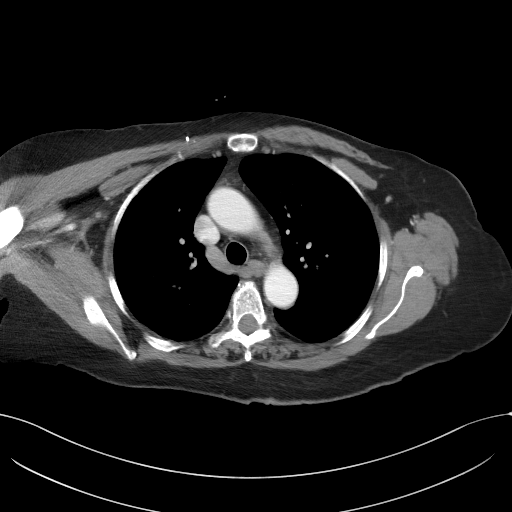
[im 115/125  soft-tissue]
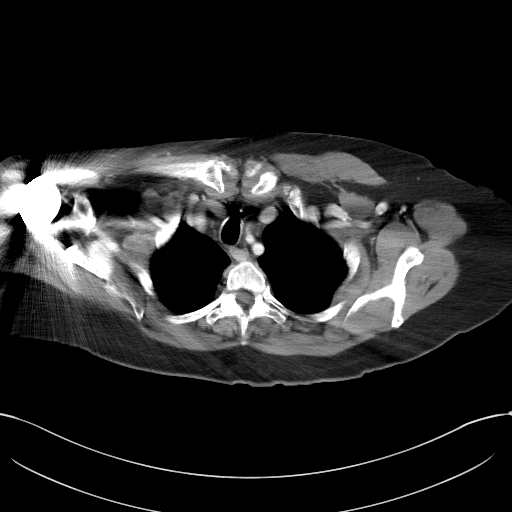

[Series 4: coronals · coronal · 1.03mm/px · 3 of 146 slices shown]
[im 49/146  soft-tissue]
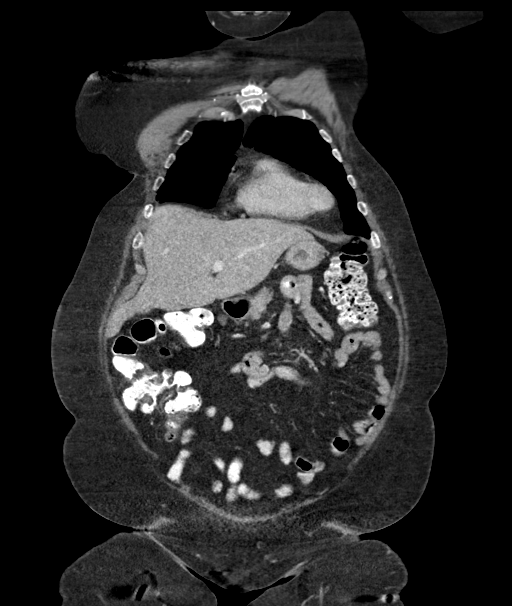
[im 65/146  soft-tissue]
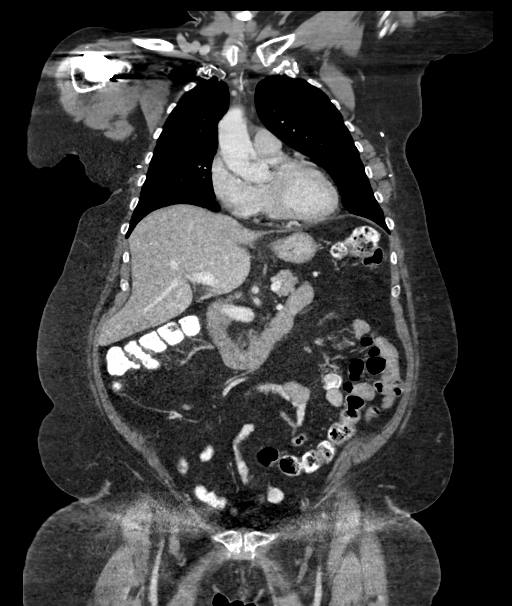
[im 81/146  soft-tissue]
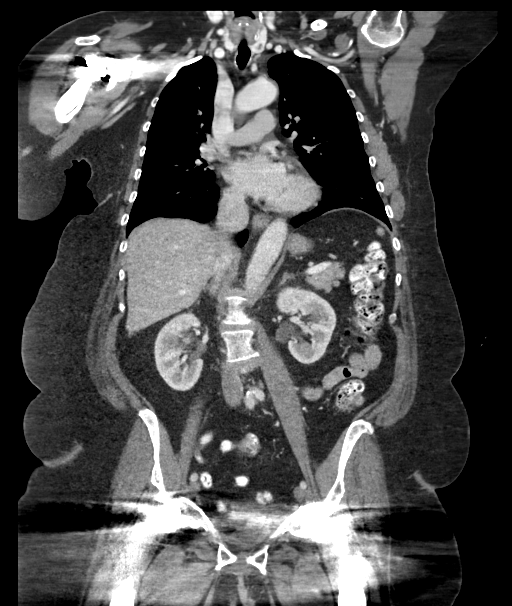

[15 of 46 positions shown; findings below may reference images not displayed]

FINDINGS: CT CHEST FINDINGS

Cardiovascular: No acute findings.

Mediastinum/Lymph Nodes: No masses or pathologically enlarged lymph
nodes identified.

Lungs/Pleura: No pulmonary infiltrate or mass identified. No
effusion present.

Musculoskeletal: No suspicious bone lesions identified. Previous
bilateral mastectomies and axillary lymph node dissections. No
evidence of chest wall mass or axillary lymphadenopathy. Right
shoulder prosthesis noted.

CT ABDOMEN AND PELVIS FINDINGS

Hepatobiliary: No masses identified. Prior cholecystectomy. No
evidence of biliary dilatation.

Pancreas:  No mass or inflammatory changes.

Spleen:  Within normal limits in size and appearance.

Adrenals/Urinary tract: Normal adrenal glands. Tiny bilateral renal
cysts. No evidence of soft tissue masses or hydronephrosis. Urinary
bladder and inferior pelvis obscured by artifact from bilateral hip
prostheses.

Stomach/Bowel: No evidence of obstruction, inflammatory process, or
abnormal fluid collections.

Vascular/Lymphatic: No pathologically enlarged lymph nodes
identified. No abdominal aortic aneurysm.

Reproductive: No mass or other significant abnormality identified.
Inferior pelvis obscured by artifact from bilateral hip prostheses.

Other: Small right supraumbilical ventral hernia seen containing
only fat.

Musculoskeletal:  No suspicious bone lesions identified.
IMPRESSION: No acute findings. No evidence of recurrent carcinoma or metastatic
disease.

## 2018-02-26 ENCOUNTER — Emergency Department (HOSPITAL_COMMUNITY): Payer: PPO

## 2018-02-26 ENCOUNTER — Other Ambulatory Visit: Payer: Self-pay

## 2018-02-26 ENCOUNTER — Emergency Department (HOSPITAL_COMMUNITY)
Admission: EM | Admit: 2018-02-26 | Discharge: 2018-02-27 | Disposition: A | Discharge status: Home / Self Care | Payer: PPO | Attending: Emergency Medicine | Admitting: Emergency Medicine

## 2018-02-26 ENCOUNTER — Encounter (HOSPITAL_COMMUNITY): Payer: Self-pay

## 2018-02-26 DIAGNOSIS — N3001 Acute cystitis with hematuria: Secondary | ICD-10-CM | POA: Diagnosis not present

## 2018-02-26 DIAGNOSIS — Z853 Personal history of malignant neoplasm of breast: Secondary | ICD-10-CM | POA: Insufficient documentation

## 2018-02-26 DIAGNOSIS — I10 Essential (primary) hypertension: Secondary | ICD-10-CM | POA: Diagnosis not present

## 2018-02-26 DIAGNOSIS — R1084 Generalized abdominal pain: Secondary | ICD-10-CM | POA: Diagnosis not present

## 2018-02-26 DIAGNOSIS — Z8719 Personal history of other diseases of the digestive system: Secondary | ICD-10-CM | POA: Diagnosis not present

## 2018-02-26 DIAGNOSIS — Z79899 Other long term (current) drug therapy: Secondary | ICD-10-CM | POA: Diagnosis not present

## 2018-02-26 DIAGNOSIS — R109 Unspecified abdominal pain: Secondary | ICD-10-CM | POA: Diagnosis not present

## 2018-02-26 LAB — COMPREHENSIVE METABOLIC PANEL
ALT: 16 U/L (ref 14–54)
AST: 17 U/L (ref 15–41)
Albumin: 3.7 g/dL (ref 3.5–5.0)
Alkaline Phosphatase: 90 U/L (ref 38–126)
Anion gap: 7 (ref 5–15)
BILIRUBIN TOTAL: 0.6 mg/dL (ref 0.3–1.2)
BUN: 15 mg/dL (ref 6–20)
CO2: 26 mmol/L (ref 22–32)
Calcium: 9.1 mg/dL (ref 8.9–10.3)
Chloride: 109 mmol/L (ref 101–111)
Creatinine, Ser: 0.98 mg/dL (ref 0.44–1.00)
GFR, EST NON AFRICAN AMERICAN: 59 mL/min — AB (ref >60)
Glucose, Bld: 98 mg/dL (ref 65–99)
POTASSIUM: 3.4 mmol/L — AB (ref 3.5–5.1)
Sodium: 142 mmol/L (ref 135–145)
TOTAL PROTEIN: 6.9 g/dL (ref 6.5–8.1)

## 2018-02-26 LAB — URINALYSIS, ROUTINE W REFLEX MICROSCOPIC
BILIRUBIN URINE: NEGATIVE
Glucose, UA: NEGATIVE mg/dL
Ketones, ur: NEGATIVE mg/dL
NITRITE: POSITIVE — AB
PH: 5 (ref 5.0–8.0)
Protein, ur: NEGATIVE mg/dL
SPECIFIC GRAVITY, URINE: 1.025 (ref 1.005–1.030)

## 2018-02-26 LAB — CBC
HEMATOCRIT: 41 % (ref 36.0–46.0)
Hemoglobin: 13 g/dL (ref 12.0–15.0)
MCH: 26.4 pg (ref 26.0–34.0)
MCHC: 31.7 g/dL (ref 30.0–36.0)
MCV: 83.2 fL (ref 78.0–100.0)
PLATELETS: 235 10*3/uL (ref 150–400)
RBC: 4.93 MIL/uL (ref 3.87–5.11)
RDW: 14.9 % (ref 11.5–15.5)
WBC: 7.3 10*3/uL (ref 4.0–10.5)

## 2018-02-26 LAB — LIPASE, BLOOD: LIPASE: 20 U/L (ref 11–51)

## 2018-02-26 MED ORDER — DICYCLOMINE HCL 10 MG/ML IM SOLN
20.0000 mg | Freq: Once | INTRAMUSCULAR | Status: AC
  Administered 2018-02-27: 20 mg via INTRAMUSCULAR
  Filled 2018-02-26: qty 2

## 2018-02-26 MED ORDER — IOPAMIDOL (ISOVUE-300) INJECTION 61%
100.0000 mL | Freq: Once | INTRAVENOUS | Status: AC | PRN
  Administered 2018-02-26: 100 mL via INTRAVENOUS

## 2018-02-26 MED ORDER — IOPAMIDOL (ISOVUE-300) INJECTION 61%
INTRAVENOUS | Status: AC
  Filled 2018-02-26: qty 100

## 2018-02-26 MED ORDER — ONDANSETRON HCL 4 MG/2ML IJ SOLN
4.0000 mg | Freq: Once | INTRAMUSCULAR | Status: AC
  Administered 2018-02-26: 4 mg via INTRAVENOUS
  Filled 2018-02-26: qty 2

## 2018-02-26 MED ORDER — MORPHINE SULFATE (PF) 4 MG/ML IV SOLN
4.0000 mg | Freq: Once | INTRAVENOUS | Status: AC
  Administered 2018-02-26: 4 mg via INTRAVENOUS
  Filled 2018-02-26: qty 1

## 2018-02-26 MED ORDER — LOSARTAN POTASSIUM 50 MG PO TABS
100.0000 mg | ORAL_TABLET | Freq: Once | ORAL | Status: AC
  Administered 2018-02-26: 100 mg via ORAL
  Filled 2018-02-26: qty 2

## 2018-02-26 MED ORDER — SODIUM CHLORIDE 0.9 % IV SOLN
1.0000 g | Freq: Once | INTRAVENOUS | Status: AC
  Administered 2018-02-27: 1 g via INTRAVENOUS
  Filled 2018-02-26: qty 10

## 2018-02-26 MED ORDER — SODIUM CHLORIDE 0.9 % IV BOLUS
500.0000 mL | Freq: Once | INTRAVENOUS | Status: AC
  Administered 2018-02-27: 500 mL via INTRAVENOUS

## 2018-02-26 NOTE — ED Notes (Signed)
PA at bedside. PT instructed to get dressed into gown once PA leaves.

## 2018-02-26 NOTE — ED Notes (Signed)
Patient transported to CT 

## 2018-02-26 NOTE — ED Triage Notes (Signed)
Pt reports N/V/D x2 weeks. Also reporting generalized abdominal pain. She is bloated and tender upon palpation. She reports that she has tried pepto, zantac, and immodium at home without relief. Denies blood in her stool. Denies a hx of HTN and is hypertensive in triage.

## 2018-02-27 MED ORDER — SUCRALFATE 1 G PO TABS: 1.0000 g | ORAL_TABLET | Freq: Three times a day (TID) | ORAL | 0 refills | Status: DC

## 2018-02-27 MED ORDER — CEPHALEXIN 500 MG PO CAPS: 500.0000 mg | ORAL_CAPSULE | Freq: Two times a day (BID) | ORAL | 0 refills | Status: DC

## 2018-02-27 MED ORDER — OMEPRAZOLE 20 MG PO CPDR: 20.0000 mg | DELAYED_RELEASE_CAPSULE | Freq: Every day | ORAL | 0 refills | Status: DC

## 2018-02-27 MED ORDER — ONDANSETRON 4 MG PO TBDP: 4.0000 mg | ORAL_TABLET | Freq: Three times a day (TID) | ORAL | 0 refills | Status: DC | PRN

## 2018-02-27 MED ORDER — DICYCLOMINE HCL 20 MG PO TABS: 20.0000 mg | ORAL_TABLET | Freq: Two times a day (BID) | ORAL | 0 refills | Status: DC

## 2018-02-27 NOTE — ED Provider Notes (Signed)
[facility] DEPT Provider Note   CSN: [id number] Arrival date & time: 02/26/18  2025     History   Chief Complaint Chief Complaint  Patient presents with  . Abdominal Pain  . Bloated    HPI Cynthia Velasquez is a 67 y.o. female.  HPI 67 year old African-American female past medical history significant for hypertension, hyperlipidemia, cholecystitis, remote history of breast cancer presents to the emergency department today for evaluation of abdominal pain.  Patient also reports associated nausea, vomiting and diarrhea for the past 2 weeks.  Patient reports generalized abdominal pain that she describes as cramping and bloated.  Patient has tried Pepto-Bismol, Imodium and Zantac with little relief at home.  Denies any bloody stools.  Denies any black tarry stools.  Patient states that palpation makes the pain worse.  She reports several episodes of emesis that are intermittent.  She also reports daily diarrhea.  Denies any recent antibiotic use or travel.  Patient seen at urgent care and sent to the ED for further evaluation.  Patient denies urinary symptoms.  Denies any associated fevers or chills.  She does report history of prior hernia repair and cholecystectomy.  Denies any history of bowel obstruction.  She does have a history of hypertension but does not take her medication at home today.  The patient states that she has not had any further diarrhea since this afternoon.  Patient reports passing flatulence.  Reports last colonoscopy was 2 years ago and was reassuring.  Pt denies any fever, chill, ha, vision changes, lightheadedness, dizziness, congestion, neck pain, cp, sob, cough,  urinary symptoms,  melena, hematochezia, lower extremity paresthesias.  Past Medical History:  Diagnosis Date  . Anxiety   . Arthritis   . Bilateral breast cancer ([place]) 10/21/2013  . Breast cancer ([place])   . Cancer ([place])   . Depression   . Gallstones   . GERD (gastroesophageal  reflux disease)    doesn't take any meds for this  . Headache    h/o migraines      . History of bladder infections   . History of hiatal hernia   . History of migraine   . Hyperlipidemia    takes Simvastatin daily  . Hypertension    takes Hyzaar  . Joint pain   . Joint swelling   . Lumbar stenosis   . Neuropathy 09/07/2013  . Pneumonia   . Pre-diabetes     Patient Active Problem List   Diagnosis Date Noted  . Hematuria, gross 03/11/2017  . Status post total shoulder arthroplasty, right 09/12/2016  . Arthritis of knee 02/27/2015  . Primary osteoarthritis of right knee Valgus 02/24/2015  . Neuropathy 09/07/2013  . Breast cancer of upper-outer quadrant of left female breast ([place]) 08/09/2013  . Breast cancer of upper-outer quadrant of right female breast ([place]) 08/09/2013  . Osteoarthritis of right hip 05/03/2013  . Osteoarthritis of left hip 02/15/2013    Past Surgical History:  Procedure Laterality Date  . ABDOMINAL HYSTERECTOMY    . bone spur removed from left foot    . CHOLECYSTECTOMY    . COLONOSCOPY    . double mastectomy     . HERNIA REPAIR     umbilical  . right knee arthroscopy    . right shoulder arthroscopy    . surgery for hiatal hernia    . TONSILLECTOMY    . TOTAL HIP ARTHROPLASTY Left 02/12/2013  . TOTAL HIP ARTHROPLASTY Left 02/12/2013   Procedure: LEFT TOTAL HIP ARTHROPLASTY;  Surgeon: Kerin Salen, MD;  Location: [facility];  Service: Orthopedics;  Laterality: Left;  . TOTAL HIP ARTHROPLASTY Right 05/03/2013   Procedure: TOTAL HIP ARTHROPLASTY;  Surgeon: Kerin Salen, MD;  Location: [facility];  Service: Orthopedics;  Laterality: Right;  . TOTAL KNEE ARTHROPLASTY Right 02/27/2015   Procedure: TOTAL KNEE ARTHROPLASTY;  Surgeon: Frederik Pear, MD;  Location: [facility];  Service: Orthopedics;  Laterality: Right;  . TOTAL SHOULDER ARTHROPLASTY Right 09/12/2016   Procedure: RIGHT TOTAL SHOULDER ARTHROPLASTY;  Surgeon: Tania Ade, MD;  Location: [facility];  Service:  Orthopedics;  Laterality: Right;  RIGHT TOTAL SHOULDER ARTHROPLASTY     OB History   None      Home Medications    Prior to Admission medications   Medication Sig Start Date End Date Taking? Authorizing Provider  losartan (COZAAR) 100 MG tablet TK 1 T PO D 01/14/17  Yes [provider]  polyethylene glycol (MIRALAX / GLYCOLAX) packet Take 17 g by mouth daily as needed for moderate constipation.   Yes [provider]  ranitidine (ZANTAC) 150 MG tablet Take 150 mg by mouth 2 (two) times daily as needed for heartburn.   Yes [provider]  rosuvastatin (CRESTOR) 20 MG tablet Take 20 mg by mouth every evening. 05/27/16  Yes [provider]  cephALEXin (KEFLEX) 500 MG capsule Take 1 capsule (500 mg total) by mouth 2 (two) times daily. 02/27/18   Doristine Devoid, PA-C  dicyclomine (BENTYL) 20 MG tablet Take 1 tablet (20 mg total) by mouth 2 (two) times daily. 02/27/18   Doristine Devoid, PA-C  docusate sodium (COLACE) 100 MG capsule Take 1 capsule (100 mg total) by mouth 3 (three) times daily as needed. Patient not taking: Reported on 02/26/2018 09/13/16   Grier Mitts, PA-C  montelukast (SINGULAIR) 10 MG tablet TAKE 1 TABLET(10 MG) BY MOUTH AT BEDTIME Patient taking differently: TAKE 1 TABLET(10 MG) BY MOUTH AT DAILY AS NEEDED FOR ALLERGIES 02/05/17   Kozlow, Donnamarie Poag, MD  nitroGLYCERIN (NITROSTAT) 0.4 MG SL tablet Place 0.4 mg under the tongue every 5 (five) minutes x 3 doses as needed for chest pain. 05/27/16   [provider]  Olopatadine HCl (PATADAY) 0.2 % SOLN Place 1 drop into both eyes daily. Patient not taking: Reported on 02/26/2018 02/05/17   Jiles Prows, MD  omeprazole (PRILOSEC) 20 MG capsule Take 1 capsule (20 mg total) by mouth daily. 02/27/18   Doristine Devoid, PA-C  ondansetron (ZOFRAN ODT) 4 MG disintegrating tablet Take 1 tablet (4 mg total) by mouth every 8 (eight) hours as needed for nausea or vomiting. 02/27/18    Reyhan Moronta, Zack Seal, PA-C  PROAIR HFA 108 (90 Base) MCG/ACT inhaler 2 inhalations every 4-6 hours as needed. Patient taking differently: Inhale 2 puffs into the lungs every 6 (six) hours as needed for wheezing or shortness of breath.  02/05/17   Kozlow, Donnamarie Poag, MD  sucralfate (CARAFATE) 1 g tablet Take 1 tablet (1 g total) by mouth 4 (four) times daily -  with meals and at bedtime. 02/27/18   Doristine Devoid, PA-C    Family History Family History  Adopted: Yes  Problem Relation Age of Onset  . Allergic rhinitis Neg Hx   . Angioedema Neg Hx   . Atopy Neg Hx   . Eczema Neg Hx   . Immunodeficiency Neg Hx   . Urticaria Neg Hx   . Asthma Neg Hx     Social History Social  History   Tobacco Use  . Smoking status: Never Smoker  . Smokeless tobacco: Never Used  Substance Use Topics  . Alcohol use: No    Comment: rare  . Drug use: No     Allergies   Oxycodone-acetaminophen; Codeine; and Vicodin [hydrocodone-acetaminophen]   Review of Systems Review of Systems  All other systems reviewed and are negative.    Physical Exam Updated Vital Signs BP 140/70 (BP Location: Right Arm)   Pulse (!) 56   Temp 98.1 F (36.7 C) (Oral)   Resp 16   Wt 104.3 kg (230 lb)   SpO2 96%   BMI 37.69 kg/m   Physical Exam  Constitutional: She is oriented to person, place, and time. She appears well-developed and well-nourished.  Non-toxic appearance. No distress.  HENT:  Head: Normocephalic and atraumatic.  Nose: Nose normal.  Mouth/Throat: Oropharynx is clear and moist.  Mucous membranes are moist.  Eyes: Pupils are equal, round, and reactive to light. Conjunctivae are normal. Right eye exhibits no discharge. Left eye exhibits no discharge.  Neck: Normal range of motion. Neck supple.  Cardiovascular: Normal rate, regular rhythm, normal heart sounds and intact distal pulses. Exam reveals no gallop and no friction rub.  No murmur heard. Pulmonary/Chest: Effort normal and breath sounds  normal. No stridor. No respiratory distress. She has no wheezes. She has no rales. She exhibits no tenderness.  Abdominal: Soft. Normal appearance. Bowel sounds are increased. There is generalized tenderness. There is no rigidity, no rebound, no guarding, no CVA tenderness, no tenderness at McBurney's point and negative Murphy's sign.  Musculoskeletal: Normal range of motion. She exhibits no tenderness.  Lymphadenopathy:    She has no cervical adenopathy.  Neurological: She is alert and oriented to person, place, and time.  Skin: Skin is warm and dry. Capillary refill takes less than 2 seconds.  Psychiatric: Her behavior is normal. Judgment and thought content normal.  Nursing note and vitals reviewed.    ED Treatments / Results  Labs (all labs ordered are listed, but only abnormal results are displayed) Labs Reviewed  COMPREHENSIVE METABOLIC PANEL - Abnormal; Notable for the following components:      Result Value   Potassium 3.4 (*)    GFR calc non Af Amer 59 (*)    All other components within normal limits  URINALYSIS, ROUTINE W REFLEX MICROSCOPIC - Abnormal; Notable for the following components:   Hgb urine dipstick MODERATE (*)    Nitrite POSITIVE (*)    Leukocytes, UA SMALL (*)    Bacteria, UA MANY (*)    All other components within normal limits  GASTROINTESTINAL PANEL BY PCR, STOOL (REPLACES STOOL CULTURE)  LIPASE, BLOOD  CBC    EKG None  Radiology Ct Abdomen Pelvis W Contrast  Result Date: 02/26/2018 CLINICAL DATA:  Generalized acute abdominal pain. EXAM: CT ABDOMEN AND PELVIS WITH CONTRAST TECHNIQUE: Multidetector CT imaging of the abdomen and pelvis was performed using the standard protocol following bolus administration of intravenous contrast. CONTRAST:  156mL ISOVUE-300 IOPAMIDOL (ISOVUE-300) INJECTION 61% COMPARISON:  CT abdomen pelvis 06/02/2017 FINDINGS: LOWER CHEST: No basilar pulmonary nodules or pleural effusion. No apical pericardial effusion. HEPATOBILIARY:  Normal hepatic contours and density. No intra- or extrahepatic biliary dilatation. Normal gallbladder. PANCREAS: Normal parenchymal contours without ductal dilatation. No peripancreatic fluid collection. SPLEEN: Normal. ADRENALS/URINARY TRACT: --Adrenal glands: Normal. --Right kidney/ureter: No hydronephrosis, nephroureterolithiasis, perinephric stranding or solid renal mass. --Left kidney/ureter: No hydronephrosis, nephroureterolithiasis, perinephric stranding or solid renal mass. --Urinary bladder: Normal for  degree of distention STOMACH/BOWEL: --Stomach/Duodenum: No hiatal hernia or other gastric abnormality. Normal duodenal course. --Small bowel: No dilatation or inflammation. --Colon: No focal abnormality. --Appendix: Normal. VASCULAR/LYMPHATIC: Normal course and caliber of the major abdominal vessels. No abdominal or pelvic lymphadenopathy. REPRODUCTIVE: Pelvis is largely obscured by streak artifact from bilateral hip arthroplasties. MUSCULOSKELETAL. Bilateral total hip arthroplasties. OTHER: None. IMPRESSION: No acute abdominal or pelvic abnormality. Electronically Signed   By: Ulyses Jarred M.D.   On: 02/26/2018 23:45    Procedures Procedures (including critical care time)  Medications Ordered in ED Medications  losartan (COZAAR) tablet 100 mg (100 mg Oral Given 02/26/18 2338)  ondansetron (ZOFRAN) injection 4 mg (4 mg Intravenous Given 02/26/18 2245)  morphine 4 MG/ML injection 4 mg (4 mg Intravenous Given 02/26/18 2245)  iopamidol (ISOVUE-300) 61 % injection 100 mL (100 mLs Intravenous Contrast Given 02/26/18 2313)  sodium chloride 0.9 % bolus 500 mL (0 mLs Intravenous Stopped 02/27/18 0207)  cefTRIAXone (ROCEPHIN) 1 g in sodium chloride 0.9 % 100 mL IVPB (0 g Intravenous Stopped 02/27/18 0057)  dicyclomine (BENTYL) injection 20 mg (20 mg Intramuscular Given 02/27/18 0054)     Initial Impression / Assessment and Plan / ED Course  I have reviewed the triage vital signs and the nursing  notes.  Pertinent labs & imaging results that were available during my care of the patient were reviewed by me and considered in my medical decision making (see chart for details).     Patient is nontoxic, nonseptic appearing, in no apparent distress.  Patient's pain and other symptoms adequately managed in emergency department.  Fluid bolus given.  Labs, imaging and vitals reviewed.  No leukocytosis.  No significant electrolyte derangement.  Kidney function is normal.  Normal liver enzymes.  Lipase is reassuring.  UA does show signs of infection with nitrite positive, leukocytes and many bacteria.  Patient unable to provide stool sample in the ED so unable to order GI panel.  CT scan of abdomen shows no acute abnormality's.  Patient's pain treated with Bentyl and pain medication and she feels much improved.  No further episodes of emesis in the ED.  Doubt C. difficile.  Will treat patient with antibiotics for UTI.  Given that the CT scan does not show any signs of colitis and patient is afebrile without any leukocytosis doubt any infectious diarrhea.  Patient will need to provide a stool sample to her primary care doctor and follow-up with a GI doctor.  Patient does not meet the SIRS or Sepsis criteria.  On repeat exam patient does not have a surgical abdomin and there are no peritoneal signs.  No indication of appendicitis, bowel obstruction, bowel perforation, cholecystitis, diverticulitis. Patient discharged home with symptomatic treatment and given strict instructions for follow-up with their primary care physician. Pt is hemodynamically stable, in NAD, & able to ambulate in the ED. Evaluation does not show pathology that would require ongoing emergent intervention or inpatient treatment. I explained the diagnosis to the patient. Pain has been managed & has no complaints prior to dc. Pt is comfortable with above plan and is stable for discharge at this time. All questions were answered prior to  disposition. Strict return precautions for f/u to the ED were discussed. Encouraged follow up with PCP. Patient discussed with my attending who also evaluated patient and is agreed with the above plan.      Final Clinical Impressions(s) / ED Diagnoses   Final diagnoses:  Generalized abdominal pain  Acute cystitis with hematuria  ED Discharge Orders        Ordered    cephALEXin (KEFLEX) 500 MG capsule  2 times daily     02/27/18 0117    dicyclomine (BENTYL) 20 MG tablet  2 times daily     02/27/18 0117    sucralfate (CARAFATE) 1 g tablet  3 times daily with meals & bedtime     02/27/18 0117    omeprazole (PRILOSEC) 20 MG capsule  Daily     02/27/18 0117    ondansetron (ZOFRAN ODT) 4 MG disintegrating tablet  Every 8 hours PRN     02/27/18 0124       Doristine Devoid, PA-C 02/27/18 0717    Molpus, John, MD 02/27/18 [phone]

## 2018-02-27 NOTE — Discharge Instructions (Addendum)
Work-up has been reassuring today.  Unknown cause of your abdominal pain.  Your urine did show signs of infection.  We will treat you with antibiotics.  Have given you Bentyl to help with stomach cramping.  We also gave you Carafate to take before meals to help with any pain or ulcers in your stomach.  You may need to follow-up with a GI doctor to have a colonoscopy and endoscopy performed if symptoms persist.  Would continue taking the Imodium for diarrhea.  Follow-up with a primary care doctor and return to the ED with any worsening symptoms.

## 2018-05-22 ENCOUNTER — Emergency Department (HOSPITAL_COMMUNITY): Payer: PPO

## 2018-05-22 ENCOUNTER — Encounter (HOSPITAL_COMMUNITY): Payer: Self-pay

## 2018-05-22 ENCOUNTER — Other Ambulatory Visit: Payer: Self-pay

## 2018-05-22 ENCOUNTER — Emergency Department (HOSPITAL_COMMUNITY)
Admission: EM | Admit: 2018-05-22 | Discharge: 2018-05-22 | Disposition: A | Discharge status: Home / Self Care | Payer: PPO | Attending: Emergency Medicine | Admitting: Emergency Medicine

## 2018-05-22 DIAGNOSIS — Z853 Personal history of malignant neoplasm of breast: Secondary | ICD-10-CM | POA: Insufficient documentation

## 2018-05-22 DIAGNOSIS — E785 Hyperlipidemia, unspecified: Secondary | ICD-10-CM | POA: Insufficient documentation

## 2018-05-22 DIAGNOSIS — R51 Headache: Secondary | ICD-10-CM | POA: Insufficient documentation

## 2018-05-22 DIAGNOSIS — Z79899 Other long term (current) drug therapy: Secondary | ICD-10-CM | POA: Diagnosis not present

## 2018-05-22 DIAGNOSIS — R110 Nausea: Secondary | ICD-10-CM | POA: Insufficient documentation

## 2018-05-22 DIAGNOSIS — R531 Weakness: Secondary | ICD-10-CM | POA: Diagnosis not present

## 2018-05-22 DIAGNOSIS — R0902 Hypoxemia: Secondary | ICD-10-CM | POA: Diagnosis not present

## 2018-05-22 DIAGNOSIS — R42 Dizziness and giddiness: Secondary | ICD-10-CM

## 2018-05-22 DIAGNOSIS — M542 Cervicalgia: Secondary | ICD-10-CM | POA: Insufficient documentation

## 2018-05-22 DIAGNOSIS — I6523 Occlusion and stenosis of bilateral carotid arteries: Secondary | ICD-10-CM | POA: Diagnosis not present

## 2018-05-22 DIAGNOSIS — G43809 Other migraine, not intractable, without status migrainosus: Secondary | ICD-10-CM | POA: Diagnosis not present

## 2018-05-22 DIAGNOSIS — I10 Essential (primary) hypertension: Secondary | ICD-10-CM | POA: Diagnosis not present

## 2018-05-22 LAB — I-STAT CHEM 8, ED
BUN: 16 mg/dL (ref 8–23)
CALCIUM ION: 1.2 mmol/L (ref 1.15–1.40)
CHLORIDE: 107 mmol/L (ref 98–111)
Creatinine, Ser: 0.8 mg/dL (ref 0.44–1.00)
Glucose, Bld: 102 mg/dL — ABNORMAL HIGH (ref 70–99)
HCT: 40 % (ref 36.0–46.0)
Hemoglobin: 13.6 g/dL (ref 12.0–15.0)
Potassium: 3.7 mmol/L (ref 3.5–5.1)
SODIUM: 142 mmol/L (ref 135–145)
TCO2: 25 mmol/L (ref 22–32)

## 2018-05-22 LAB — DIFFERENTIAL
Abs Immature Granulocytes: 0 10*3/uL (ref 0.0–0.1)
Basophils Absolute: 0 10*3/uL (ref 0.0–0.1)
Basophils Relative: 1 %
Eosinophils Absolute: 0.1 10*3/uL (ref 0.0–0.7)
Eosinophils Relative: 2 %
IMMATURE GRANULOCYTES: 0 %
LYMPHS ABS: 1.4 10*3/uL (ref 0.7–4.0)
LYMPHS PCT: 22 %
Monocytes Absolute: 0.5 10*3/uL (ref 0.1–1.0)
Monocytes Relative: 7 %
NEUTROS ABS: 4.2 10*3/uL (ref 1.7–7.7)
Neutrophils Relative %: 68 %

## 2018-05-22 LAB — CBC
HEMATOCRIT: 42.1 % (ref 36.0–46.0)
Hemoglobin: 12.9 g/dL (ref 12.0–15.0)
MCH: 25.9 pg — AB (ref 26.0–34.0)
MCHC: 30.6 g/dL (ref 30.0–36.0)
MCV: 84.5 fL (ref 78.0–100.0)
Platelets: 226 10*3/uL (ref 150–400)
RBC: 4.98 MIL/uL (ref 3.87–5.11)
RDW: 14.6 % (ref 11.5–15.5)
WBC: 6.2 10*3/uL (ref 4.0–10.5)

## 2018-05-22 LAB — COMPREHENSIVE METABOLIC PANEL
ALBUMIN: 3.4 g/dL — AB (ref 3.5–5.0)
ALT: 14 U/L (ref 0–44)
AST: 18 U/L (ref 15–41)
Alkaline Phosphatase: 98 U/L (ref 38–126)
Anion gap: 7 (ref 5–15)
BILIRUBIN TOTAL: 0.5 mg/dL (ref 0.3–1.2)
BUN: 13 mg/dL (ref 8–23)
CO2: 26 mmol/L (ref 22–32)
CREATININE: 0.86 mg/dL (ref 0.44–1.00)
Calcium: 9.2 mg/dL (ref 8.9–10.3)
Chloride: 110 mmol/L (ref 98–111)
GFR calc Af Amer: >60 mL/min (ref >60)
GFR calc non Af Amer: >60 mL/min (ref >60)
GLUCOSE: 108 mg/dL — AB (ref 70–99)
POTASSIUM: 3.9 mmol/L (ref 3.5–5.1)
Sodium: 143 mmol/L (ref 135–145)
TOTAL PROTEIN: 6.9 g/dL (ref 6.5–8.1)

## 2018-05-22 LAB — CBG MONITORING, ED: Glucose-Capillary: 96 mg/dL (ref 70–99)

## 2018-05-22 LAB — APTT: aPTT: 32 seconds (ref 24–36)

## 2018-05-22 LAB — PROTIME-INR
INR: 1.02
Prothrombin Time: 13.3 seconds (ref 11.4–15.2)

## 2018-05-22 LAB — I-STAT TROPONIN, ED: Troponin i, poc: 0.01 ng/mL (ref 0.00–0.08)

## 2018-05-22 MED ORDER — PROCHLORPERAZINE EDISYLATE 10 MG/2ML IJ SOLN
10.0000 mg | Freq: Once | INTRAMUSCULAR | Status: AC
  Administered 2018-05-22: 10 mg via INTRAVENOUS
  Filled 2018-05-22: qty 2

## 2018-05-22 MED ORDER — KETOROLAC TROMETHAMINE 15 MG/ML IJ SOLN
10.0000 mg | Freq: Once | INTRAMUSCULAR | Status: AC
  Administered 2018-05-22: 10 mg via INTRAVENOUS
  Filled 2018-05-22: qty 1

## 2018-05-22 MED ORDER — GADOBUTROL 1 MMOL/ML IV SOLN
10.0000 mL | Freq: Once | INTRAVENOUS | Status: AC | PRN
  Administered 2018-05-22: 10 mL via INTRAVENOUS

## 2018-05-22 MED ORDER — MECLIZINE HCL 25 MG PO TABS
25.0000 mg | ORAL_TABLET | Freq: Once | ORAL | Status: AC
  Administered 2018-05-22: 25 mg via ORAL
  Filled 2018-05-22: qty 1

## 2018-05-22 MED ORDER — IOPAMIDOL (ISOVUE-370) INJECTION 76%
INTRAVENOUS | Status: AC
  Filled 2018-05-22: qty 50

## 2018-05-22 MED ORDER — IOPAMIDOL (ISOVUE-370) INJECTION 76%
50.0000 mL | Freq: Once | INTRAVENOUS | Status: AC | PRN
  Administered 2018-05-22: 50 mL via INTRAVENOUS

## 2018-05-22 MED ORDER — MECLIZINE HCL 25 MG PO TABS: 25.0000 mg | ORAL_TABLET | Freq: Three times a day (TID) | ORAL | 0 refills | Status: DC | PRN

## 2018-05-22 MED ORDER — METOCLOPRAMIDE HCL 5 MG/ML IJ SOLN
10.0000 mg | Freq: Once | INTRAMUSCULAR | Status: AC
  Administered 2018-05-22: 10 mg via INTRAVENOUS
  Filled 2018-05-22: qty 2

## 2018-05-22 NOTE — ED Notes (Signed)
Patient transported to CT 

## 2018-05-22 NOTE — ED Notes (Signed)
Pt able to ambulate with steady gait and no complaints.

## 2018-05-22 NOTE — Discharge Instructions (Addendum)
You had an MRI of your neck today that showed arthritis.  Please follow up with your family doctor for recheck of your symptoms.  Get rechecked immediately if you have any new or concerning symptoms.

## 2018-05-22 NOTE — ED Notes (Signed)
Pt states LNW approx 2300 last night.

## 2018-05-22 NOTE — ED Notes (Signed)
Patient verbalizes understanding of discharge instructions. Opportunity for questioning and answers were provided. Armband removed by staff, pt discharged from ED.  

## 2018-05-22 NOTE — ED Notes (Signed)
Patient transported to MRI 

## 2018-05-22 NOTE — ED Provider Notes (Signed)
[facility] EMERGENCY DEPARTMENT Provider Note   CSN: [id number] Arrival date & time:        History   Chief Complaint Chief Complaint  Patient presents with  . Dizziness  . Nausea    HPI Cynthia Velasquez is a 67 y.o. female.  The history is provided by the patient and a relative. No language interpreter was used.  Dizziness   Cynthia Velasquez is a 67 y.o. female who presents to the Emergency Department complaining of dizziness. Resents to the emergency department complaining of dizziness that began upon waking at 545 this morning. She was in her routine state of health at 11 o'clock last night. When she woke this morning she felt nauseous and dizziness described as a vertiginous type sensation. Symptoms are worse when she is laying supine. She endorses pain to her posterior neck that began yesterday and is worsening today. Also upon awakening she noticed that her right arm seemed weaker than usual. She denies any vision changes or numbness. No prior similar symptoms. She does endorse some mild chest pressure and shortness of breath. She does have a history of TIA in the past but those prior symptoms were quite different. Upon ED arrival she endorses developing a left sided headache typical for her migraine headaches. No history of complicated migraine. Past Medical History:  Diagnosis Date  . Anxiety   . Arthritis   . Bilateral breast cancer ([place]) 10/21/2013  . Breast cancer ([place])   . Cancer ([place])   . Depression   . Gallstones   . GERD (gastroesophageal reflux disease)    doesn't take any meds for this  . Headache    h/o migraines      . History of bladder infections   . History of hiatal hernia   . History of migraine   . Hyperlipidemia    takes Simvastatin daily  . Hypertension    takes Hyzaar  . Joint pain   . Joint swelling   . Lumbar stenosis   . Neuropathy 09/07/2013  . Pneumonia   . Pre-diabetes     Patient Active Problem List   Diagnosis Date  Noted  . Hematuria, gross 03/11/2017  . Status post total shoulder arthroplasty, right 09/12/2016  . Arthritis of knee 02/27/2015  . Primary osteoarthritis of right knee Valgus 02/24/2015  . Neuropathy 09/07/2013  . Breast cancer of upper-outer quadrant of left female breast ([place]) 08/09/2013  . Breast cancer of upper-outer quadrant of right female breast ([place]) 08/09/2013  . Osteoarthritis of right hip 05/03/2013  . Osteoarthritis of left hip 02/15/2013    Past Surgical History:  Procedure Laterality Date  . ABDOMINAL HYSTERECTOMY    . bone spur removed from left foot    . CHOLECYSTECTOMY    . COLONOSCOPY    . double mastectomy     . HERNIA REPAIR     umbilical  . right knee arthroscopy    . right shoulder arthroscopy    . surgery for hiatal hernia    . TONSILLECTOMY    . TOTAL HIP ARTHROPLASTY Left 02/12/2013  . TOTAL HIP ARTHROPLASTY Left 02/12/2013   Procedure: LEFT TOTAL HIP ARTHROPLASTY;  Surgeon: Kerin Salen, MD;  Location: [facility];  Service: Orthopedics;  Laterality: Left;  . TOTAL HIP ARTHROPLASTY Right 05/03/2013   Procedure: TOTAL HIP ARTHROPLASTY;  Surgeon: Kerin Salen, MD;  Location: [facility];  Service: Orthopedics;  Laterality: Right;  . TOTAL KNEE ARTHROPLASTY Right 02/27/2015   Procedure: TOTAL KNEE  ARTHROPLASTY;  Surgeon: Frederik Pear, MD;  Location: [facility];  Service: Orthopedics;  Laterality: Right;  . TOTAL SHOULDER ARTHROPLASTY Right 09/12/2016   Procedure: RIGHT TOTAL SHOULDER ARTHROPLASTY;  Surgeon: Tania Ade, MD;  Location: [facility];  Service: Orthopedics;  Laterality: Right;  RIGHT TOTAL SHOULDER ARTHROPLASTY     OB History   None      Home Medications    Prior to Admission medications   Medication Sig Start Date End Date Taking? Authorizing Provider  cephALEXin (KEFLEX) 500 MG capsule Take 1 capsule (500 mg total) by mouth 2 (two) times daily. 02/27/18   Doristine Devoid, PA-C  dicyclomine (BENTYL) 20 MG tablet Take 1 tablet (20 mg total) by mouth 2  (two) times daily. 02/27/18   Doristine Devoid, PA-C  docusate sodium (COLACE) 100 MG capsule Take 1 capsule (100 mg total) by mouth 3 (three) times daily as needed. Patient not taking: Reported on 02/26/2018 09/13/16   Grier Mitts, PA-C  losartan (COZAAR) 100 MG tablet TK 1 T PO D 01/14/17   [provider]  meclizine (ANTIVERT) 25 MG tablet Take 1 tablet (25 mg total) by mouth 3 (three) times daily as needed for dizziness. 05/22/18   Quintella Reichert, MD  montelukast (SINGULAIR) 10 MG tablet TAKE 1 TABLET(10 MG) BY MOUTH AT BEDTIME Patient taking differently: TAKE 1 TABLET(10 MG) BY MOUTH AT DAILY AS NEEDED FOR ALLERGIES 02/05/17   Kozlow, Donnamarie Poag, MD  nitroGLYCERIN (NITROSTAT) 0.4 MG SL tablet Place 0.4 mg under the tongue every 5 (five) minutes x 3 doses as needed for chest pain. 05/27/16   [provider]  Olopatadine HCl (PATADAY) 0.2 % SOLN Place 1 drop into both eyes daily. Patient not taking: Reported on 02/26/2018 02/05/17   Jiles Prows, MD  omeprazole (PRILOSEC) 20 MG capsule Take 1 capsule (20 mg total) by mouth daily. 02/27/18   Doristine Devoid, PA-C  ondansetron (ZOFRAN ODT) 4 MG disintegrating tablet Take 1 tablet (4 mg total) by mouth every 8 (eight) hours as needed for nausea or vomiting. 02/27/18   Ocie Cornfield T, PA-C  polyethylene glycol (MIRALAX / GLYCOLAX) packet Take 17 g by mouth daily as needed for moderate constipation.    [provider]  PROAIR HFA 108 [phone] Base) MCG/ACT inhaler 2 inhalations every 4-6 hours as needed. Patient taking differently: Inhale 2 puffs into the lungs every 6 (six) hours as needed for wheezing or shortness of breath.  02/05/17   Kozlow, Donnamarie Poag, MD  ranitidine (ZANTAC) 150 MG tablet Take 150 mg by mouth 2 (two) times daily as needed for heartburn.    [provider]  rosuvastatin (CRESTOR) 20 MG tablet Take 20 mg by mouth every evening. 05/27/16   [provider]  sucralfate (CARAFATE) 1 g tablet  Take 1 tablet (1 g total) by mouth 4 (four) times daily -  with meals and at bedtime. 02/27/18   Doristine Devoid, PA-C    Family History Family History  Adopted: Yes  Problem Relation Age of Onset  . Allergic rhinitis Neg Hx   . Angioedema Neg Hx   . Atopy Neg Hx   . Eczema Neg Hx   . Immunodeficiency Neg Hx   . Urticaria Neg Hx   . Asthma Neg Hx     Social History Social History   Tobacco Use  . Smoking status: Never Smoker  . Smokeless tobacco: Never Used  Substance Use Topics  . Alcohol use: No  Comment: rare  . Drug use: No     Allergies   Oxycodone-acetaminophen; Codeine; and Vicodin [hydrocodone-acetaminophen]   Review of Systems Review of Systems  Neurological: Positive for dizziness.  All other systems reviewed and are negative.    Physical Exam Updated Vital Signs BP (!) 141/85   Pulse 70   Temp 97.9 F (36.6 C)   Resp 17   Ht 5\' 5"  (1.651 m)   Wt 104.3 kg   SpO2 100%   BMI 38.27 kg/m   Physical Exam  Constitutional: She is oriented to person, place, and time. She appears well-developed and well-nourished.  HENT:  Head: Normocephalic and atraumatic.  Eyes: Pupils are equal, round, and reactive to light. EOM are normal.  Cardiovascular: Normal rate and regular rhythm.  No murmur heard. Pulmonary/Chest: Effort normal and breath sounds normal. No respiratory distress.  Abdominal: Soft. There is no tenderness. There is no rebound and no guarding.  Musculoskeletal: She exhibits no edema or tenderness.  Neurological: She is alert and oriented to person, place, and time.  Mild RUE/RLE weakness and drift.  Sensation to light touch intact in all four extremities. CNs grossly intact.   Skin: Skin is warm and dry.  Psychiatric: She has a normal mood and affect. Her behavior is normal.  Nursing note and vitals reviewed.    ED Treatments / Results  Labs (all labs ordered are listed, but only abnormal results are displayed) Labs Reviewed  CBC  - Abnormal; Notable for the following components:      Result Value   MCH 25.9 (*)    All other components within normal limits  COMPREHENSIVE METABOLIC PANEL - Abnormal; Notable for the following components:   Glucose, Bld 108 (*)    Albumin 3.4 (*)    All other components within normal limits  I-STAT CHEM 8, ED - Abnormal; Notable for the following components:   Glucose, Bld 102 (*)    All other components within normal limits  PROTIME-INR  APTT  DIFFERENTIAL  I-STAT TROPONIN, ED  CBG MONITORING, ED    EKG None  Radiology Ct Angio Head W Or Wo Contrast  Result Date: 05/22/2018 CLINICAL DATA:  67 year old female with dizziness and nausea onset this morning. Neck pain since yesterday. Bilateral upper extremity heaviness, worse on the right. EXAM: CT ANGIOGRAPHY HEAD AND NECK TECHNIQUE: Multidetector CT imaging of the head and neck was performed using the standard protocol during bolus administration of intravenous contrast. Multiplanar CT image reconstructions and MIPs were obtained to evaluate the vascular anatomy. Carotid stenosis measurements (when applicable) are obtained utilizing NASCET criteria, using the distal internal carotid diameter as the denominator. CONTRAST:  21mL ISOVUE-370 IOPAMIDOL (ISOVUE-370) INJECTION 76%, COMPARISON:  Brain and cervical spine MRI today reported separately. Head CT earlier today. FINDINGS: CT HEAD Brain: Stable since 0904 hours today, negative. Calvarium and skull base: Stable, negative. Paranasal sinuses: Visualized paranasal sinuses and mastoids are stable and well pneumatized. Orbits: Stable, negative. CTA NECK Skeleton: Cervical spine degeneration detailed on the earlier MRI today. No acute osseous abnormality identified. Bilateral TMJ degeneration. Upper chest: Dependent atelectasis in the upper lungs. No superior mediastinal lymphadenopathy. Other neck: Negative.  No neck mass or lymphadenopathy. Aortic arch: 3 vessel arch configuration. Minimal  arch atherosclerosis. No great vessel origin stenosis. Right carotid system: Tortuous brachiocephalic artery with a kinked appearance at the right thoracic inlet (series 10, image 133). Normal right CCA origin. Tortuous proximal right CCA. Negative right carotid bifurcation and cervical right ICA. Left carotid  system: Negative left CCA aside from moderate tortuosity. Minimal atherosclerosis at the posterior left ICA origin, no stenosis. Vertebral arteries: Tortuous proximal right subclavian artery without stenosis. Normal right vertebral artery origin. Patent and normal right vertebral artery to the skull base. No proximal left subclavian artery stenosis despite soft and calcified plaque. At the left thoracic inlet there is a kinked appearance of the vessel due to tortuosity (series 13, image 116). The left vertebral artery arises just distally with no origin stenosis. Tortuous left V1 segment. The left vertebral artery is mildly dominant and patent to the skull base without stenosis. CTA HEAD Posterior circulation: No distal vertebral artery stenosis. The left is mildly dominant. Normal right PICA and dominant appearing left AICA origins. Vertebrobasilar junction and basilar arteries are patent without stenosis. AICA, SCA and PCA origins are patent and appear normal. Posterior communicating arteries are diminutive or absent. Tortuous left P1 and P2 segments, but otherwise normal bilateral PCA branches. Anterior circulation: Both ICA siphons are patent. No siphon atherosclerosis or stenosis. Normal ophthalmic artery origins. Patent carotid termini. Normal MCA and ACA origins. Anterior communicating artery and bilateral ACA branches are within normal limits. Left MCA M1 segment, bifurcation, and left MCA branches are within normal limits. Right MCA M1 segment, bifurcation, and right MCA branches are within normal limits. Venous sinuses: Patent. Anatomic variants: Mildly dominant left vertebral artery. Delayed phase:  No abnormal enhancement identified. Review of the MIP images confirms the above findings IMPRESSION: 1. CTA is negative for arterial stenosis or large vessel occlusion in the head or neck. 2. Generalized arterial tortuosity. Minimal for age atherosclerosis. 3. Stable and negative CT appearance of the brain. 4. Cervical spine degeneration as detailed on the MRI today reported separately. Electronically Signed   By: Genevie Ann M.D.   On: 05/22/2018 13:03   Ct Head Wo Contrast  Result Date: 05/22/2018 CLINICAL DATA:  Headache for 2 days EXAM: CT HEAD WITHOUT CONTRAST TECHNIQUE: Contiguous axial images were obtained from the base of the skull through the vertex without intravenous contrast. COMPARISON:  April 23, 2016 FINDINGS: Brain: The ventricles are normal in size and configuration. There is no intracranial mass, hemorrhage, extra-axial fluid collection, or midline shift. Gray-white compartments appear unremarkable. No acute infarct evident. Vascular: No hyperdense vessel. There is slight calcification in cavernous carotid artery region on the right. Skull: Bony calvarium appears intact. Sinuses/Orbits: There is opacification in several ethmoid air cells. There is a tiny retention cyst in the anterior right maxillary antrum. Other visualized paranasal sinuses are clear. Orbits appear symmetric bilaterally. Other: Visualized mastoid air cells are clear. There is mild debris in the left external auditory canal. IMPRESSION: Gray-white compartments appear normal. No mass or hemorrhage. There is slight calcification in the right cavernous carotid artery. Foci of paranasal sinus disease noted. There is probable cerumen in the left external auditory canal. Electronically Signed   By: Lowella Grip III M.D.   On: 05/22/2018 09:19   Ct Angio Neck W And/or Wo Contrast  Result Date: 05/22/2018 CLINICAL DATA:  67 year old female with dizziness and nausea onset this morning. Neck pain since yesterday. Bilateral upper  extremity heaviness, worse on the right. EXAM: CT ANGIOGRAPHY HEAD AND NECK TECHNIQUE: Multidetector CT imaging of the head and neck was performed using the standard protocol during bolus administration of intravenous contrast. Multiplanar CT image reconstructions and MIPs were obtained to evaluate the vascular anatomy. Carotid stenosis measurements (when applicable) are obtained utilizing NASCET criteria, using the distal internal carotid diameter as  the denominator. CONTRAST:  102mL ISOVUE-370 IOPAMIDOL (ISOVUE-370) INJECTION 76%, COMPARISON:  Brain and cervical spine MRI today reported separately. Head CT earlier today. FINDINGS: CT HEAD Brain: Stable since 0904 hours today, negative. Calvarium and skull base: Stable, negative. Paranasal sinuses: Visualized paranasal sinuses and mastoids are stable and well pneumatized. Orbits: Stable, negative. CTA NECK Skeleton: Cervical spine degeneration detailed on the earlier MRI today. No acute osseous abnormality identified. Bilateral TMJ degeneration. Upper chest: Dependent atelectasis in the upper lungs. No superior mediastinal lymphadenopathy. Other neck: Negative.  No neck mass or lymphadenopathy. Aortic arch: 3 vessel arch configuration. Minimal arch atherosclerosis. No great vessel origin stenosis. Right carotid system: Tortuous brachiocephalic artery with a kinked appearance at the right thoracic inlet (series 10, image 133). Normal right CCA origin. Tortuous proximal right CCA. Negative right carotid bifurcation and cervical right ICA. Left carotid system: Negative left CCA aside from moderate tortuosity. Minimal atherosclerosis at the posterior left ICA origin, no stenosis. Vertebral arteries: Tortuous proximal right subclavian artery without stenosis. Normal right vertebral artery origin. Patent and normal right vertebral artery to the skull base. No proximal left subclavian artery stenosis despite soft and calcified plaque. At the left thoracic inlet there is a  kinked appearance of the vessel due to tortuosity (series 13, image 116). The left vertebral artery arises just distally with no origin stenosis. Tortuous left V1 segment. The left vertebral artery is mildly dominant and patent to the skull base without stenosis. CTA HEAD Posterior circulation: No distal vertebral artery stenosis. The left is mildly dominant. Normal right PICA and dominant appearing left AICA origins. Vertebrobasilar junction and basilar arteries are patent without stenosis. AICA, SCA and PCA origins are patent and appear normal. Posterior communicating arteries are diminutive or absent. Tortuous left P1 and P2 segments, but otherwise normal bilateral PCA branches. Anterior circulation: Both ICA siphons are patent. No siphon atherosclerosis or stenosis. Normal ophthalmic artery origins. Patent carotid termini. Normal MCA and ACA origins. Anterior communicating artery and bilateral ACA branches are within normal limits. Left MCA M1 segment, bifurcation, and left MCA branches are within normal limits. Right MCA M1 segment, bifurcation, and right MCA branches are within normal limits. Venous sinuses: Patent. Anatomic variants: Mildly dominant left vertebral artery. Delayed phase: No abnormal enhancement identified. Review of the MIP images confirms the above findings IMPRESSION: 1. CTA is negative for arterial stenosis or large vessel occlusion in the head or neck. 2. Generalized arterial tortuosity. Minimal for age atherosclerosis. 3. Stable and negative CT appearance of the brain. 4. Cervical spine degeneration as detailed on the MRI today reported separately. Electronically Signed   By: Genevie Ann M.D.   On: 05/22/2018 13:03   Mr Brain Wo Contrast  Result Date: 05/22/2018 CLINICAL DATA:  67 year old female with dizziness and nausea onset this morning. Neck pain since yesterday. Bilateral upper extremity heaviness, worse on the right. EXAM: MRI HEAD WITHOUT CONTRAST MRI CERVICAL SPINE WITHOUT AND  WITH CONTRAST TECHNIQUE: Multiplanar, multiecho pulse sequences of the brain and surrounding structures, and cervical spine, to include the craniocervical junction and cervicothoracic junction, were obtained without and with intravenous contrast. CONTRAST:  10 milliliters Gadavist COMPARISON:  Head CT without contrast earlier today. Cervical spine MRI 01/27/2017. Brain MRI 04/23/2016. FINDINGS: MRI HEAD FINDINGS Brain: No restricted diffusion to suggest acute infarction. No midline shift, mass effect, evidence of mass lesion, ventriculomegaly, extra-axial collection or acute intracranial hemorrhage. Cervicomedullary junction and pituitary are within normal limits. [place] Cables and white matter signal remains normal throughout the brain.  No encephalomalacia or chronic cerebral blood products identified. Vascular: Major intracranial vascular flow voids are stable since 2017 and within normal limits. Skull and upper cervical spine: Normal bone marrow signal. Sinuses/Orbits: Normal orbits soft tissues. Paranasal sinuses and mastoids are stable and well pneumatized. Other: Grossly stable and normal internal auditory structures. Scalp and face soft tissues appear negative. MRI CERVICAL SPINE FINDINGS Alignment: Stable since 2018. Straightening and mild reversal of cervical lordosis. Vertebrae: Patchy degenerative appearing endplate edema and enhancement at C3-C4 and superiorly at C5 (series 14, image 8). In 2018 there was similar degenerative appearing endplate edema anteriorly at C6-C7. No other acute osseous abnormality identified. Cord: No abnormal spinal cord signal identified. No abnormal intradural enhancement. No dural thickening. Posterior Fossa, vertebral arteries, paraspinal tissues: Major vascular flow voids in the neck are preserved. Neck soft tissues appears stable and negative. Negative visible lung apices. Disc levels: C2-C3:  Mild facet hypertrophy on the left.  No stenosis. C3-C4: Chronic circumferential disc  bulge and endplate spurring. Mild left facet hypertrophy. Borderline to mild spinal stenosis. Moderate left and mild right C4 foraminal stenosis. This level is stable. C4-C5: Chronic disc space loss with anterior eccentric circumferential disc osteophyte complex. Broad-based posterior component spinal stenosis with mild spinal cord mass effect. Moderate bilateral C5 foraminal stenosis. This level is stable. C5-C6: Chronic disc space loss with circumferential disc osteophyte complex. Spinal stenosis with no definite cord mass effect. Moderate to severe right greater than left C6 foraminal stenosis. This level is stable. C6-C7: Chronic circumferential disc bulge and endplate spurring eccentric to the left with broad-based posterior component. Mild spinal stenosis with no cord mass effect. Moderate to severe left and mild right C7 foraminal stenosis. This level is stable. C7-T1: Mild anterolisthesis with moderate facet hypertrophy greater on the left. Mild left C8 foraminal stenosis. No spinal stenosis. This level is stable. Stable visible upper thoracic levels with left side facet arthropathy but no spinal stenosis. IMPRESSION: 1. Stable and normal MRI appearance of the brain. 2. Stable MRI appearance of the cervical spine since 2018 aside from new mild degenerative endplate marrow edema at C3-C4 and C5. Chronic disc and endplate degeneration resulting in mild spinal stenosis from C3-C4 to C6-C7 with up to mild cord mass effect but no cord signal abnormality. Moderate or severe neural foraminal stenosis at the left C4, bilateral C5, bilateral C6, and left C7 nerve levels. Electronically Signed   By: Genevie Ann M.D.   On: 05/22/2018 12:42   Mr Cervical Spine W Or Wo Contrast  Result Date: 05/22/2018 CLINICAL DATA:  67 year old female with dizziness and nausea onset this morning. Neck pain since yesterday. Bilateral upper extremity heaviness, worse on the right. EXAM: MRI HEAD WITHOUT CONTRAST MRI CERVICAL SPINE WITHOUT  AND WITH CONTRAST TECHNIQUE: Multiplanar, multiecho pulse sequences of the brain and surrounding structures, and cervical spine, to include the craniocervical junction and cervicothoracic junction, were obtained without and with intravenous contrast. CONTRAST:  10 milliliters Gadavist COMPARISON:  Head CT without contrast earlier today. Cervical spine MRI 01/27/2017. Brain MRI 04/23/2016. FINDINGS: MRI HEAD FINDINGS Brain: No restricted diffusion to suggest acute infarction. No midline shift, mass effect, evidence of mass lesion, ventriculomegaly, extra-axial collection or acute intracranial hemorrhage. Cervicomedullary junction and pituitary are within normal limits. [place] Cables and white matter signal remains normal throughout the brain. No encephalomalacia or chronic cerebral blood products identified. Vascular: Major intracranial vascular flow voids are stable since 2017 and within normal limits. Skull and upper cervical spine: Normal bone marrow signal.  Sinuses/Orbits: Normal orbits soft tissues. Paranasal sinuses and mastoids are stable and well pneumatized. Other: Grossly stable and normal internal auditory structures. Scalp and face soft tissues appear negative. MRI CERVICAL SPINE FINDINGS Alignment: Stable since 2018. Straightening and mild reversal of cervical lordosis. Vertebrae: Patchy degenerative appearing endplate edema and enhancement at C3-C4 and superiorly at C5 (series 14, image 8). In 2018 there was similar degenerative appearing endplate edema anteriorly at C6-C7. No other acute osseous abnormality identified. Cord: No abnormal spinal cord signal identified. No abnormal intradural enhancement. No dural thickening. Posterior Fossa, vertebral arteries, paraspinal tissues: Major vascular flow voids in the neck are preserved. Neck soft tissues appears stable and negative. Negative visible lung apices. Disc levels: C2-C3:  Mild facet hypertrophy on the left.  No stenosis. C3-C4: Chronic circumferential disc  bulge and endplate spurring. Mild left facet hypertrophy. Borderline to mild spinal stenosis. Moderate left and mild right C4 foraminal stenosis. This level is stable. C4-C5: Chronic disc space loss with anterior eccentric circumferential disc osteophyte complex. Broad-based posterior component spinal stenosis with mild spinal cord mass effect. Moderate bilateral C5 foraminal stenosis. This level is stable. C5-C6: Chronic disc space loss with circumferential disc osteophyte complex. Spinal stenosis with no definite cord mass effect. Moderate to severe right greater than left C6 foraminal stenosis. This level is stable. C6-C7: Chronic circumferential disc bulge and endplate spurring eccentric to the left with broad-based posterior component. Mild spinal stenosis with no cord mass effect. Moderate to severe left and mild right C7 foraminal stenosis. This level is stable. C7-T1: Mild anterolisthesis with moderate facet hypertrophy greater on the left. Mild left C8 foraminal stenosis. No spinal stenosis. This level is stable. Stable visible upper thoracic levels with left side facet arthropathy but no spinal stenosis. IMPRESSION: 1. Stable and normal MRI appearance of the brain. 2. Stable MRI appearance of the cervical spine since 2018 aside from new mild degenerative endplate marrow edema at C3-C4 and C5. Chronic disc and endplate degeneration resulting in mild spinal stenosis from C3-C4 to C6-C7 with up to mild cord mass effect but no cord signal abnormality. Moderate or severe neural foraminal stenosis at the left C4, bilateral C5, bilateral C6, and left C7 nerve levels. Electronically Signed   By: Genevie Ann M.D.   On: 05/22/2018 12:42    Procedures Procedures (including critical care time)  Medications Ordered in ED Medications  metoCLOPramide (REGLAN) injection 10 mg (10 mg Intravenous Given 05/22/18 0933)  gadobutrol (GADAVIST) 1 MMOL/ML injection 10 mL (10 mLs Intravenous Contrast Given 05/22/18 1217)    iopamidol (ISOVUE-370) 76 % injection 50 mL (50 mLs Intravenous Contrast Given 05/22/18 1229)  meclizine (ANTIVERT) tablet 25 mg (25 mg Oral Given 05/22/18 1350)  prochlorperazine (COMPAZINE) injection 10 mg (10 mg Intravenous Given 05/22/18 1350)  ketorolac (TORADOL) 15 MG/ML injection 10 mg (10 mg Intravenous Given 05/22/18 1351)     Initial Impression / Assessment and Plan / ED Course  I have reviewed the triage vital signs and the nursing notes.  Pertinent labs & imaging results that were available during my care of the patient were reviewed by me and considered in my medical decision making (see chart for details).     Patient here for evaluation of dizziness, neck pain, right upper and lower extremity weakness. She is non-toxic appearing on examination, does have some right sided weakness. Presentation is not consistent with CVA. Imaging does demonstrate degenerative changes in the C-spine without acute cord lesion. No evidence of acute CVA/TIA or dissection.  Following treatment with meclizine, Reglan and Compazine her headache is resolved as well as her vertigo. On repeat assessment her right sided weakness is completely resolved as well. Discussed with patient findings of studies with degenerative changes in her neck. Discussed outpatient follow-up as well as return precautions.  Final Clinical Impressions(s) / ED Diagnoses   Final diagnoses:  Vertigo  Other migraine without status migrainosus, not intractable  Neck pain    ED Discharge Orders         Ordered    meclizine (ANTIVERT) 25 MG tablet  3 times daily PRN     05/22/18 1508           Quintella Reichert, MD 05/22/18 1621

## 2018-05-22 NOTE — ED Triage Notes (Signed)
Pt brought in by by [facility] from home for dizziness and nausea that started this am. Pt also endorses bilateral arm "heaviness", with the right arm feeling worse. Pt states she started having neck pain yesterday, but denies any other symptoms. Pt ambulatory on scene. Pt A+Ox4, given 4mg  zofran PTA with relief.

## 2018-06-09 DIAGNOSIS — M5416 Radiculopathy, lumbar region: Secondary | ICD-10-CM | POA: Diagnosis not present

## 2018-06-25 DIAGNOSIS — J069 Acute upper respiratory infection, unspecified: Secondary | ICD-10-CM | POA: Diagnosis not present

## 2018-07-05 DIAGNOSIS — J209 Acute bronchitis, unspecified: Secondary | ICD-10-CM | POA: Diagnosis not present

## 2018-11-27 DIAGNOSIS — Z6841 Body Mass Index (BMI) 40.0 and over, adult: Secondary | ICD-10-CM | POA: Diagnosis not present

## 2018-11-27 DIAGNOSIS — R0602 Shortness of breath: Secondary | ICD-10-CM | POA: Diagnosis not present

## 2018-11-27 DIAGNOSIS — R109 Unspecified abdominal pain: Secondary | ICD-10-CM | POA: Diagnosis not present

## 2018-11-27 DIAGNOSIS — H6122 Impacted cerumen, left ear: Secondary | ICD-10-CM | POA: Diagnosis not present

## 2018-11-27 DIAGNOSIS — K5901 Slow transit constipation: Secondary | ICD-10-CM | POA: Diagnosis not present

## 2018-11-30 DIAGNOSIS — M5412 Radiculopathy, cervical region: Secondary | ICD-10-CM | POA: Diagnosis not present

## 2018-12-14 DIAGNOSIS — M47812 Spondylosis without myelopathy or radiculopathy, cervical region: Secondary | ICD-10-CM | POA: Diagnosis not present

## 2018-12-17 DIAGNOSIS — M47812 Spondylosis without myelopathy or radiculopathy, cervical region: Secondary | ICD-10-CM | POA: Diagnosis not present

## 2018-12-30 DIAGNOSIS — M47812 Spondylosis without myelopathy or radiculopathy, cervical region: Secondary | ICD-10-CM | POA: Diagnosis not present

## 2018-12-31 DIAGNOSIS — M47812 Spondylosis without myelopathy or radiculopathy, cervical region: Secondary | ICD-10-CM | POA: Diagnosis not present

## 2019-01-04 DIAGNOSIS — M47812 Spondylosis without myelopathy or radiculopathy, cervical region: Secondary | ICD-10-CM | POA: Diagnosis not present

## 2019-01-06 DIAGNOSIS — M47812 Spondylosis without myelopathy or radiculopathy, cervical region: Secondary | ICD-10-CM | POA: Diagnosis not present

## 2019-01-08 DIAGNOSIS — N764 Abscess of vulva: Secondary | ICD-10-CM | POA: Diagnosis not present

## 2019-01-11 DIAGNOSIS — M47812 Spondylosis without myelopathy or radiculopathy, cervical region: Secondary | ICD-10-CM | POA: Diagnosis not present

## 2019-01-13 DIAGNOSIS — M47812 Spondylosis without myelopathy or radiculopathy, cervical region: Secondary | ICD-10-CM | POA: Diagnosis not present

## 2019-01-19 DIAGNOSIS — M47812 Spondylosis without myelopathy or radiculopathy, cervical region: Secondary | ICD-10-CM | POA: Diagnosis not present

## 2019-01-21 DIAGNOSIS — M47812 Spondylosis without myelopathy or radiculopathy, cervical region: Secondary | ICD-10-CM | POA: Diagnosis not present

## 2019-01-27 DIAGNOSIS — M47812 Spondylosis without myelopathy or radiculopathy, cervical region: Secondary | ICD-10-CM | POA: Diagnosis not present

## 2019-02-02 DIAGNOSIS — M47812 Spondylosis without myelopathy or radiculopathy, cervical region: Secondary | ICD-10-CM | POA: Diagnosis not present

## 2019-02-04 DIAGNOSIS — M47812 Spondylosis without myelopathy or radiculopathy, cervical region: Secondary | ICD-10-CM | POA: Diagnosis not present

## 2019-02-05 DIAGNOSIS — N907 Vulvar cyst: Secondary | ICD-10-CM | POA: Diagnosis not present

## 2019-02-09 DIAGNOSIS — M47812 Spondylosis without myelopathy or radiculopathy, cervical region: Secondary | ICD-10-CM | POA: Diagnosis not present

## 2019-02-16 DIAGNOSIS — M47812 Spondylosis without myelopathy or radiculopathy, cervical region: Secondary | ICD-10-CM | POA: Diagnosis not present

## 2019-02-18 DIAGNOSIS — M47812 Spondylosis without myelopathy or radiculopathy, cervical region: Secondary | ICD-10-CM | POA: Diagnosis not present

## 2019-02-23 DIAGNOSIS — M47812 Spondylosis without myelopathy or radiculopathy, cervical region: Secondary | ICD-10-CM | POA: Diagnosis not present

## 2019-02-25 DIAGNOSIS — M47812 Spondylosis without myelopathy or radiculopathy, cervical region: Secondary | ICD-10-CM | POA: Diagnosis not present

## 2019-02-26 DIAGNOSIS — R112 Nausea with vomiting, unspecified: Secondary | ICD-10-CM | POA: Diagnosis not present

## 2019-02-27 DIAGNOSIS — Z20828 Contact with and (suspected) exposure to other viral communicable diseases: Secondary | ICD-10-CM | POA: Diagnosis not present

## 2019-03-09 DIAGNOSIS — M47812 Spondylosis without myelopathy or radiculopathy, cervical region: Secondary | ICD-10-CM | POA: Diagnosis not present

## 2019-03-16 DIAGNOSIS — M47812 Spondylosis without myelopathy or radiculopathy, cervical region: Secondary | ICD-10-CM | POA: Diagnosis not present

## 2019-03-23 DIAGNOSIS — M5416 Radiculopathy, lumbar region: Secondary | ICD-10-CM | POA: Diagnosis not present

## 2019-03-29 DIAGNOSIS — M545 Low back pain: Secondary | ICD-10-CM | POA: Diagnosis not present

## 2019-04-02 DIAGNOSIS — M47816 Spondylosis without myelopathy or radiculopathy, lumbar region: Secondary | ICD-10-CM | POA: Diagnosis not present

## 2019-04-15 DIAGNOSIS — K648 Other hemorrhoids: Secondary | ICD-10-CM | POA: Diagnosis not present

## 2019-04-22 DIAGNOSIS — M47816 Spondylosis without myelopathy or radiculopathy, lumbar region: Secondary | ICD-10-CM | POA: Diagnosis not present

## 2019-04-24 DIAGNOSIS — K921 Melena: Secondary | ICD-10-CM | POA: Diagnosis not present

## 2019-04-24 DIAGNOSIS — R1084 Generalized abdominal pain: Secondary | ICD-10-CM | POA: Diagnosis not present

## 2019-05-14 DIAGNOSIS — K625 Hemorrhage of anus and rectum: Secondary | ICD-10-CM | POA: Diagnosis not present

## 2019-06-01 DIAGNOSIS — Z1159 Encounter for screening for other viral diseases: Secondary | ICD-10-CM | POA: Diagnosis not present

## 2019-06-04 DIAGNOSIS — C20 Malignant neoplasm of rectum: Secondary | ICD-10-CM | POA: Diagnosis not present

## 2019-06-04 DIAGNOSIS — K6289 Other specified diseases of anus and rectum: Secondary | ICD-10-CM | POA: Diagnosis not present

## 2019-06-04 DIAGNOSIS — K625 Hemorrhage of anus and rectum: Secondary | ICD-10-CM | POA: Diagnosis not present

## 2019-06-09 DIAGNOSIS — C20 Malignant neoplasm of rectum: Secondary | ICD-10-CM | POA: Diagnosis not present

## 2019-06-10 ENCOUNTER — Telehealth: Payer: Self-pay

## 2019-06-10 ENCOUNTER — Telehealth: Payer: Self-pay | Admitting: Hematology

## 2019-06-10 DIAGNOSIS — C210 Malignant neoplasm of anus, unspecified: Secondary | ICD-10-CM

## 2019-06-10 NOTE — Telephone Encounter (Signed)
A new patient appointment has been scheduled for [name] to see [name] on 10/5 at 3pm. Letter mailed to the pt.

## 2019-06-10 NOTE — Telephone Encounter (Signed)
Called patient to establish contact and to review scheduled appointment with [name] on 06/21/19. Patient understands that she will be scheduled with rad/onc as well.   Arna Snipe, MS Ed.S, RN  Gastrointestinal Oncology Nurse [facility] [place] Phone: [phone]

## 2019-06-11 ENCOUNTER — Telehealth: Payer: Self-pay

## 2019-06-11 ENCOUNTER — Telehealth: Payer: Self-pay | Admitting: Radiation Oncology

## 2019-06-11 NOTE — Telephone Encounter (Signed)
New Message: ° ° °LVM for patient to return call to schedule appt from referral received. °

## 2019-06-11 NOTE — Telephone Encounter (Signed)
Spoke with patient  to advise appointment rescheduled to 9/28 @ 9:45 AM with Cira Rue NP. Daughter will be able to bring patient to appointment. She is aware of visitor restrictions.

## 2019-06-13 DIAGNOSIS — C210 Malignant neoplasm of anus, unspecified: Secondary | ICD-10-CM | POA: Insufficient documentation

## 2019-06-14 ENCOUNTER — Telehealth: Payer: Self-pay

## 2019-06-14 ENCOUNTER — Inpatient Hospital Stay: Payer: PPO | Attending: Hematology | Admitting: Nurse Practitioner

## 2019-06-14 ENCOUNTER — Other Ambulatory Visit: Payer: Self-pay

## 2019-06-14 ENCOUNTER — Other Ambulatory Visit: Payer: Self-pay | Admitting: Hematology

## 2019-06-14 ENCOUNTER — Inpatient Hospital Stay: Payer: PPO

## 2019-06-14 ENCOUNTER — Encounter: Payer: Self-pay | Admitting: Nurse Practitioner

## 2019-06-14 VITALS — BP 166/92 | HR 68 | Temp 98.5°F | Resp 18 | Ht 65.0 in | Wt 237.1 lb

## 2019-06-14 DIAGNOSIS — Z853 Personal history of malignant neoplasm of breast: Secondary | ICD-10-CM | POA: Diagnosis not present

## 2019-06-14 DIAGNOSIS — I10 Essential (primary) hypertension: Secondary | ICD-10-CM | POA: Diagnosis not present

## 2019-06-14 DIAGNOSIS — C210 Malignant neoplasm of anus, unspecified: Secondary | ICD-10-CM | POA: Diagnosis not present

## 2019-06-14 LAB — CMP (CANCER CENTER ONLY)
ALT: 9 U/L (ref 0–44)
AST: 14 U/L — ABNORMAL LOW (ref 15–41)
Albumin: 3.7 g/dL (ref 3.5–5.0)
Alkaline Phosphatase: 106 U/L (ref 38–126)
Anion gap: 6 (ref 5–15)
BUN: 13 mg/dL (ref 8–23)
CO2: 26 mmol/L (ref 22–32)
Calcium: 9 mg/dL (ref 8.9–10.3)
Chloride: 109 mmol/L (ref 98–111)
Creatinine: 0.91 mg/dL (ref 0.44–1.00)
GFR, Est AFR Am: >60 mL/min (ref >60)
GFR, Estimated: >60 mL/min (ref >60)
Glucose, Bld: 105 mg/dL — ABNORMAL HIGH (ref 70–99)
Potassium: 3.9 mmol/L (ref 3.5–5.1)
Sodium: 141 mmol/L (ref 135–145)
Total Bilirubin: 0.4 mg/dL (ref 0.3–1.2)
Total Protein: 6.8 g/dL (ref 6.5–8.1)

## 2019-06-14 LAB — IRON AND TIBC
Iron: 49 ug/dL (ref 41–142)
Saturation Ratios: 15 % — ABNORMAL LOW (ref 21–57)
TIBC: 321 ug/dL (ref 236–444)
UIBC: 272 ug/dL (ref 120–384)

## 2019-06-14 LAB — CBC WITH DIFFERENTIAL (CANCER CENTER ONLY)
Abs Immature Granulocytes: 0.02 10*3/uL (ref 0.00–0.07)
Basophils Absolute: 0 10*3/uL (ref 0.0–0.1)
Basophils Relative: 1 %
Eosinophils Absolute: 0.6 10*3/uL — ABNORMAL HIGH (ref 0.0–0.5)
Eosinophils Relative: 9 %
HCT: 44.6 % (ref 36.0–46.0)
Hemoglobin: 14 g/dL (ref 12.0–15.0)
Immature Granulocytes: 0 %
Lymphocytes Relative: 35 %
Lymphs Abs: 2.2 10*3/uL (ref 0.7–4.0)
MCH: 26.3 pg (ref 26.0–34.0)
MCHC: 31.4 g/dL (ref 30.0–36.0)
MCV: 83.7 fL (ref 80.0–100.0)
Monocytes Absolute: 0.5 10*3/uL (ref 0.1–1.0)
Monocytes Relative: 8 %
Neutro Abs: 3 10*3/uL (ref 1.7–7.7)
Neutrophils Relative %: 47 %
Platelet Count: 255 10*3/uL (ref 150–400)
RBC: 5.33 MIL/uL — ABNORMAL HIGH (ref 3.87–5.11)
RDW: 14.4 % (ref 11.5–15.5)
WBC Count: 6.3 10*3/uL (ref 4.0–10.5)
nRBC: 0 % (ref 0.0–0.2)

## 2019-06-14 LAB — FERRITIN: Ferritin: 15 ng/mL (ref 11–307)

## 2019-06-14 MED ORDER — TRAMADOL HCL 50 MG PO TABS: 50.0000 mg | ORAL_TABLET | Freq: Four times a day (QID) | ORAL | 0 refills | Status: DC | PRN

## 2019-06-14 NOTE — Patient Instructions (Signed)
Fluorouracil, 5-FU injection What is this medicine? FLUOROURACIL, 5-FU (flure oh YOOR a sil) is a chemotherapy drug. It slows the growth of cancer cells. This medicine is used to treat many types of cancer like breast cancer, colon or rectal cancer, pancreatic cancer, and stomach cancer. This medicine may be used for other purposes; ask your health care provider or pharmacist if you have questions. COMMON BRAND NAME(S): Adrucil What should I tell my health care provider before I take this medicine? They need to know if you have any of these conditions:  blood disorders  dihydropyrimidine dehydrogenase (DPD) deficiency  infection (especially a virus infection such as chickenpox, cold sores, or herpes)  kidney disease  liver disease  malnourished, poor nutrition  recent or ongoing radiation therapy  an unusual or allergic reaction to fluorouracil, other chemotherapy, other medicines, foods, dyes, or preservatives  pregnant or trying to get pregnant  breast-feeding How should I use this medicine? This drug is given as an infusion or injection into a vein. It is administered in a hospital or clinic by a specially trained health care professional. Talk to your pediatrician regarding the use of this medicine in children. Special care may be needed. Overdosage: If you think you have taken too much of this medicine contact a poison control center or emergency room at once. NOTE: This medicine is only for you. Do not share this medicine with others. What if I miss a dose? It is important not to miss your dose. Call your doctor or health care professional if you are unable to keep an appointment. What may interact with this medicine?  allopurinol  cimetidine  dapsone  digoxin  hydroxyurea  leucovorin  levamisole  medicines for seizures like ethotoin, fosphenytoin, phenytoin  medicines to increase blood counts like filgrastim, pegfilgrastim, sargramostim  medicines that  treat or prevent blood clots like warfarin, enoxaparin, and dalteparin  methotrexate  metronidazole  pyrimethamine  some other chemotherapy drugs like busulfan, cisplatin, estramustine, vinblastine  trimethoprim  trimetrexate  vaccines Talk to your doctor or health care professional before taking any of these medicines:  acetaminophen  aspirin  ibuprofen  ketoprofen  naproxen This list may not describe all possible interactions. Give your health care provider a list of all the medicines, herbs, non-prescription drugs, or dietary supplements you use. Also tell them if you smoke, drink alcohol, or use illegal drugs. Some items may interact with your medicine. What should I watch for while using this medicine? Visit your doctor for checks on your progress. This drug may make you feel generally unwell. This is not uncommon, as chemotherapy can affect healthy cells as well as cancer cells. Report any side effects. Continue your course of treatment even though you feel ill unless your doctor tells you to stop. In some cases, you may be given additional medicines to help with side effects. Follow all directions for their use. Call your doctor or health care professional for advice if you get a fever, chills or sore throat, or other symptoms of a cold or flu. Do not treat yourself. This drug decreases your body's ability to fight infections. Try to avoid being around people who are sick. This medicine may increase your risk to bruise or bleed. Call your doctor or health care professional if you notice any unusual bleeding. Be careful brushing and flossing your teeth or using a toothpick because you may get an infection or bleed more easily. If you have any dental work done, tell your dentist you are  receiving this medicine. Avoid taking products that contain aspirin, acetaminophen, ibuprofen, naproxen, or ketoprofen unless instructed by your doctor. These medicines may hide a fever. Do not  become pregnant while taking this medicine. Women should inform their doctor if they wish to become pregnant or think they might be pregnant. There is a potential for serious side effects to an unborn child. Talk to your health care professional or pharmacist for more information. Do not breast-feed an infant while taking this medicine. Men should inform their doctor if they wish to father a child. This medicine may lower sperm counts. Do not treat diarrhea with over the counter products. Contact your doctor if you have diarrhea that lasts more than 2 days or if it is severe and watery. This medicine can make you more sensitive to the sun. Keep out of the sun. If you cannot avoid being in the sun, wear protective clothing and use sunscreen. Do not use sun lamps or tanning beds/booths. What side effects may I notice from receiving this medicine? Side effects that you should report to your doctor or health care professional as soon as possible:  allergic reactions like skin rash, itching or hives, swelling of the face, lips, or tongue  low blood counts - this medicine may decrease the number of white blood cells, red blood cells and platelets. You may be at increased risk for infections and bleeding.  signs of infection - fever or chills, cough, sore throat, pain or difficulty passing urine  signs of decreased platelets or bleeding - bruising, pinpoint red spots on the skin, black, tarry stools, blood in the urine  signs of decreased red blood cells - unusually weak or tired, fainting spells, lightheadedness  breathing problems  changes in vision  chest pain  mouth sores  nausea and vomiting  pain, swelling, redness at site where injected  pain, tingling, numbness in the hands or feet  redness, swelling, or sores on hands or feet  stomach pain  unusual bleeding Side effects that usually do not require medical attention (report to your doctor or health care professional if they  continue or are bothersome):  changes in finger or toe nails  diarrhea  dry or itchy skin  hair loss  headache  loss of appetite  sensitivity of eyes to the light  stomach upset  unusually teary eyes This list may not describe all possible side effects. Call your doctor for medical advice about side effects. You may report side effects to FDA at 1-800-FDA-1088. Where should I keep my medicine? This drug is given in a hospital or clinic and will not be stored at home. NOTE: This sheet is a summary. It may not cover all possible information. If you have questions about this medicine, talk to your doctor, pharmacist, or health care provider.  2020 Elsevier/Gold Standard (2008-01-06 13:53:16)  Mitomycin injection What is this medicine? MITOMYCIN (mye toe MYE sin) is a chemotherapy drug. This medicine is used to treat cancer of the stomach and pancreas. This medicine may be used for other purposes; ask your health care provider or pharmacist if you have questions. COMMON BRAND NAME(S): Mutamycin What should I tell my health care provider before I take this medicine? They need to know if you have any of these conditions:  anemia  bleeding disorder  infection (especially a virus infection such as chickenpox, cold sores, or herpes)  kidney disease  low blood counts like low platelets, red blood cells, white blood cells  recent radiation therapy  an unusual or allergic reaction to mitomycin, other chemotherapy agents, other medicines, foods, dyes, or preservatives  pregnant or trying to get pregnant  breast-feeding How should I use this medicine? This drug is given as an injection or infusion into a vein. It is administered in a hospital or clinic by a specially trained health care professional. Talk to your pediatrician regarding the use of this medicine in children. Special care may be needed. Overdosage: If you think you have taken too much of this medicine contact a  poison control center or emergency room at once. NOTE: This medicine is only for you. Do not share this medicine with others. What if I miss a dose? It is important not to miss your dose. Call your doctor or health care professional if you are unable to keep an appointment. What may interact with this medicine?  medicines to increase blood counts like filgrastim, pegfilgrastim, sargramostim  vaccines This list may not describe all possible interactions. Give your health care provider a list of all the medicines, herbs, non-prescription drugs, or dietary supplements you use. Also tell them if you smoke, drink alcohol, or use illegal drugs. Some items may interact with your medicine. What should I watch for while using this medicine? Your condition will be monitored carefully while you are receiving this medicine. You will need important blood work done while you are taking this medicine. This drug may make you feel generally unwell. This is not uncommon, as chemotherapy can affect healthy cells as well as cancer cells. Report any side effects. Continue your course of treatment even though you feel ill unless your doctor tells you to stop. Call your doctor or health care professional for advice if you get a fever, chills or sore throat, or other symptoms of a cold or flu. Do not treat yourself. This drug decreases your body's ability to fight infections. Try to avoid being around people who are sick. This medicine may increase your risk to bruise or bleed. Call your doctor or health care professional if you notice any unusual bleeding. Be careful brushing and flossing your teeth or using a toothpick because you may get an infection or bleed more easily. If you have any dental work done, tell your dentist you are receiving this medicine. Avoid taking products that contain aspirin, acetaminophen, ibuprofen, naproxen, or ketoprofen unless instructed by your doctor. These medicines may hide a fever. Do  not become pregnant while taking this medicine. Women should inform their doctor if they wish to become pregnant or think they might be pregnant. There is a potential for serious side effects to an unborn child. Talk to your health care professional or pharmacist for more information. Do not breast-feed an infant while taking this medicine. What side effects may I notice from receiving this medicine? Side effects that you should report to your doctor or health care professional as soon as possible:  allergic reactions like skin rash, itching or hives, swelling of the face, lips, or tongue  low blood counts - this medicine may decrease the number of white blood cells, red blood cells and platelets. You may be at increased risk for infections and bleeding.  signs of infection - fever or chills, cough, sore throat, pain or difficulty passing urine  signs of decreased platelets or bleeding - bruising, pinpoint red spots on the skin, black, tarry stools, blood in the urine  signs of decreased red blood cells - unusually weak or tired, fainting spells, lightheadedness  breathing problems  changes in vision  chest pain  confusion  dry cough  high blood pressure  mouth sores  pain, swelling, redness at site where injected  pain, tingling, numbness in the hands or feet  seizures  swelling of the ankles, feet, hands  trouble passing urine or change in the amount of urine Side effects that usually do not require medical attention (report to your doctor or health care professional if they continue or are bothersome):  diarrhea  green to blue color of urine  hair loss  loss of appetite  nausea, vomiting This list may not describe all possible side effects. Call your doctor for medical advice about side effects. You may report side effects to FDA at 1-800-FDA-1088. Where should I keep my medicine? This drug is given in a hospital or clinic and will not be stored at home. NOTE:  This sheet is a summary. It may not cover all possible information. If you have questions about this medicine, talk to your doctor, pharmacist, or health care provider.  2020 [employer]vier/Gold Standard (2008-03-10 11:16:23)

## 2019-06-14 NOTE — Progress Notes (Addendum)
[place]  [phone] [phone] [place] Note   Patient Care Team: Kathyrn Lass, MD as PCP - General (Family Medicine) Wonda Horner, MD as Consulting Physician (Gastroenterology) Truitt Merle, MD as Consulting Physician (Hematology) Alla Feeling, NP as Nurse Practitioner (Nurse Practitioner)  Date of Service: 06/14/2019   CHIEF COMPLAINTS/PURPOSE OF CONSULTATION:  Rectal cancer; referred by Dr. Anson Fret of [facility]   Summary of oncology history Oncology History Overview Note  Breast cancer of upper-outer quadrant of right female breast, (ER/PR positive, her2/Neu negative)   Primary site: Breast (Right)   Staging method: AJCC 7th Edition   Clinical: Stage IA (T1a, N0, cM0) signed by [name], MD on 09/07/2013 11:14 AM   Pathologic: Stage IA (T1a, N0, cM0) signed by [name], MD on 09/07/2013 11:14 AM   Summary: Stage IA (T1a, N0, cM0)  Breast cancer of upper-outer quadrant of left female breast (ER/PR/Her2Neu negative)   Primary site: Breast (Left)   Staging method: AJCC 7th Edition   Clinical: Stage IIA (T2, N0, cM0) signed by [name], MD on 09/07/2013 11:35 AM   Pathologic: Stage IIA (T2, N0, cM0) signed by [name], MD on 09/07/2013 11:35 AM   Summary: Stage IIA (T2, N0, cM0)     Breast cancer of upper-outer quadrant of left female breast ([place])  07/10/2007 Procedure   US biopsy showed invasive ductal carcinoma 0.8cm, Nottingham grade 3   07/30/2007 Surgery   She had left breast lumpectomy and LN biopsy which showed high grade invasive ductal cancer Nottingham grade 3, 2.7 cm, ER/PR/Her2 neu negative   11/24/2007 Surgery   Patient elected for bilateral mastectomy   09/07/2008 - 03/08/2009 Chemotherapy   dates are approximate: she received adriamycin and cytoxan followed by Taxol   03/08/2009 - 05/08/2009 Radiation Therapy   dates are approximate, she received XRT    Breast cancer of upper-outer quadrant of  right female breast ([place])  07/10/2006 Procedure   stereotactic biopsy showed atypical hyperplasia   10/23/2006 Surgery   right lumpectomy showed invasive ductal ca (Nottingham grade 1)  63m and DCIS, ER/PR positive Her 2 negative   10/09/2007 - 10/08/2012 Chemotherapy   Patient was placed on Tamoxifen   03/24/2017 Imaging   No acute findings. No evidence of recurrent carcinoma or metastatic disease   [place] cancer ([place]  06/04/2019 Procedure   Colonoscopy per Dr. SAnson Fret Findings-the digital rectal exam revealed a 3 cm diameter firm rectal mass.  The mass was noncircumferential and located predominantly at the right bowel wall at the anorectal junction. A nonobstructing mass was found at the anus and in the rectum    06/04/2019 Initial Biopsy   Follow pathology: Large intestine, rectum biopsy: Invasive well to moderately differentiated squamous cell carcinoma.  No rectal mucosa present.  There is strong diffuse expression of P 16 immunostain.  CDX 2, p63 and mCEA immunostains are also used in the diagnostic work-up of the case.   06/04/2019 Initial Diagnosis   Anal cancer ([place]   06/28/2019 -  Chemotherapy   The patient had MITOMYCIN CHEMO IV INJECTION ORDERABLE, 10 mg/m2, Intravenous,  Once, 0 of 1 cycle FLUOROURACIL (5-FU) CHEMO IV INFUSION PUMP ORDERABLE, 1,000 mg/m2/day, Intravenous, 4D (96 hours ), 0 of 1 cycle  for chemotherapy treatment.     HISTORY OF PRESENTING ILLNESS:  TAmylynn Fano68y.o. female is here because of recently diagnosed rectal cancer. She presented to GI on 05/14/19 with 2 month history  of BRBPR in the presence of known hemorrhoids with associated anal pressure and abdominal cramping.  Previous colonoscopy in 2017 showed a small polyp. She underwent a colonoscopy on 06/04/2019, DRE revealed a 3 cm firm rectal mass which was noncircumferential and located predominantly at the right bowel wall at the anorectal junction.  The exam was otherwise normal.  Pathology  confirmed invasive well to moderately differentiated squamous cell carcinoma.   PMH is significant for stage IA right breast cancer in 10/2006, grade 1 ER/PR+/HER-2- s/p right lumpectomy and adjuvant tamoxifen from 2009-2014 followed by a stage IIA left breast cancer in 06/2007, triple negative grade 3 s/p left lumpectomy tumor spanning 2.7 cm; ultimately elected for bilateral mastectomy 11/2007 followed by adjuvant chemotherapy with AC-T from 08/2008- 02/2009 followed by adjuvant RT from 02/2009 - 04/2009.  This was managed by my colleague Dr. [name].  Other PMH includes HTN, GERD, depression, OA.   Socially, she is single, lives with her daughter and granddaughter who is 55. She is very active in childcare. She is independent of ADLs, drives herself. She is active with walking. 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 Denies alcohol, tobacco, or drug use. She has unknown family history, was adopted. She reportedly had genetic testing done which was negative and her daughter was negative as well.   Today, she reports anal pain that fluctuates but can get up to 10/10 especially with sitting and BM. Has constant bleeding, also worse on BM. Stools are pencil thin. Has been using miralax, passes stool 1-2 times daily but this is becoming increasingly difficult. She cannot sit comfortably. Fatigue fluctuates as well. Denies significant loss or appetite or weight loss. She had nausea initially but none recently. She felt dizzy and lightheaded for the first time yesterday, notes blood clots with stool. Uses a cane for fatigue. Denies cough, chest pain, dyspnea, fever, chills, or recent infection.    MEDICAL HISTORY:  Past Medical History:  Diagnosis Date   Anxiety    Arthritis    Bilateral breast cancer ([place]) 10/21/2013   Breast cancer ([place])    Cancer ([place])    Depression    Gallstones    GERD (gastroesophageal reflux disease)    doesn't take any meds for this   Headache    h/o  migraines       History of bladder infections    History of hiatal hernia    History of migraine    Hyperlipidemia    takes Simvastatin daily   Hypertension    takes Hyzaar   Joint pain    Joint swelling    Lumbar stenosis    Neuropathy 09/07/2013   Pneumonia    Pre-diabetes     SURGICAL HISTORY: Past Surgical History:  Procedure Laterality Date   ABDOMINAL HYSTERECTOMY     bone spur removed from left foot     CHOLECYSTECTOMY     COLONOSCOPY     double mastectomy      HERNIA REPAIR     umbilical   right knee arthroscopy     right shoulder arthroscopy     surgery for hiatal hernia     TONSILLECTOMY     TOTAL HIP ARTHROPLASTY Left 02/12/2013   TOTAL HIP ARTHROPLASTY Left 02/12/2013   Procedure: LEFT TOTAL HIP ARTHROPLASTY;  Surgeon: Kerin Salen, MD;  Location: [facility];  Service: Orthopedics;  Laterality: Left;   TOTAL HIP ARTHROPLASTY Right 05/03/2013   Procedure: TOTAL HIP ARTHROPLASTY;  Surgeon: Pilar Plate  Karmen Bongo, MD;  Location: [place];  Service: Orthopedics;  Laterality: Right;   TOTAL KNEE ARTHROPLASTY Right 02/27/2015   Procedure: TOTAL KNEE ARTHROPLASTY;  Surgeon: Frederik Pear, MD;  Location: [facility];  Service: Orthopedics;  Laterality: Right;   TOTAL SHOULDER ARTHROPLASTY Right 09/12/2016   Procedure: RIGHT TOTAL SHOULDER ARTHROPLASTY;  Surgeon: Tania Ade, MD;  Location: [employer];  Service: Orthopedics;  Laterality: Right;  RIGHT TOTAL SHOULDER ARTHROPLASTY    SOCIAL HISTORY: Social History   Socioeconomic History   Marital status: Single    Spouse name: Not on file   Number of children: 1   Years of education: Not on file   Highest education level: Not on file  Occupational History   Not on file  Social Needs   Financial resource strain: Not on file   Food insecurity    Worry: Not on file    Inability: Not on file   Transportation needs    Medical: Not on file    Non-medical: Not on file  Tobacco Use   Smoking status: Never  Smoker   Smokeless tobacco: Never Used  Substance and Sexual Activity   Alcohol use: No    Comment: rare   Drug use: No   Sexual activity: Not Currently  Lifestyle   Physical activity    Days per week: Not on file    Minutes per session: Not on file   Stress: Not on file  Relationships   Social connections    Talks on phone: Not on file    Gets together: Not on file    Attends religious service: Not on file    Active member of club or organization: Not on file    Attends meetings of clubs or organizations: Not on file    Relationship status: Not on file   Intimate partner violence    Fear of current or ex partner: Not on file    Emotionally abused: Not on file    Physically abused: Not on file    Forced sexual activity: Not on file  Other Topics Concern   Not on file  Social History Narrative   Not on file    FAMILY HISTORY: Family History  Adopted: Yes  Problem Relation Age of Onset   Allergic rhinitis Neg Hx    Angioedema Neg Hx    Atopy Neg Hx    Eczema Neg Hx    Immunodeficiency Neg Hx    Urticaria Neg Hx    Asthma Neg Hx     ALLERGIES:  is allergic to oxycodone-acetaminophen; codeine; and vicodin [hydrocodone-acetaminophen].  MEDICATIONS:  Current Outpatient Medications  Medication Sig Dispense Refill   losartan (COZAAR) 100 MG tablet TK 1 T PO D  6   nitroGLYCERIN (NITROSTAT) 0.4 MG SL tablet Place 0.4 mg under the tongue every 5 (five) minutes x 3 doses as needed for chest pain.  0   ondansetron (ZOFRAN ODT) 4 MG disintegrating tablet Take 1 tablet (4 mg total) by mouth every 8 (eight) hours as needed for nausea or vomiting. 6 tablet 0   PROAIR HFA 108 (90 Base) MCG/ACT inhaler 2 inhalations every 4-6 hours as needed. (Patient taking differently: Inhale 2 puffs into the lungs every 6 (six) hours as needed for wheezing or shortness of breath. ) 18 g 1   cephALEXin (KEFLEX) 500 MG capsule Take 1 capsule (500 mg total) by mouth 2 (two)  times daily. 14 capsule 0   dicyclomine (BENTYL) 20 MG tablet Take 1  tablet (20 mg total) by mouth 2 (two) times daily. 20 tablet 0   docusate sodium (COLACE) 100 MG capsule Take 1 capsule (100 mg total) by mouth 3 (three) times daily as needed. (Patient not taking: Reported on 02/26/2018) 20 capsule 0   meclizine (ANTIVERT) 25 MG tablet Take 1 tablet (25 mg total) by mouth 3 (three) times daily as needed for dizziness. 10 tablet 0   montelukast (SINGULAIR) 10 MG tablet TAKE 1 TABLET(10 MG) BY MOUTH AT BEDTIME (Patient taking differently: TAKE 1 TABLET(10 MG) BY MOUTH AT DAILY AS NEEDED FOR ALLERGIES) 90 tablet 1   Olopatadine HCl (PATADAY) 0.2 % SOLN Place 1 drop into both eyes daily. (Patient not taking: Reported on 02/26/2018) 1 Bottle 5   omeprazole (PRILOSEC) 20 MG capsule Take 1 capsule (20 mg total) by mouth daily. 14 capsule 0   polyethylene glycol (MIRALAX / GLYCOLAX) packet Take 17 g by mouth daily as needed for moderate constipation.     ranitidine (ZANTAC) 150 MG tablet Take 150 mg by mouth 2 (two) times daily as needed for heartburn.     rosuvastatin (CRESTOR) 20 MG tablet Take 20 mg by mouth every evening.  3   sucralfate (CARAFATE) 1 g tablet Take 1 tablet (1 g total) by mouth 4 (four) times daily -  with meals and at bedtime. 30 tablet 0   traMADol (ULTRAM) 50 MG tablet Take 1 tablet (50 mg total) by mouth every 6 (six) hours as needed. 30 tablet 0   No current facility-administered medications for this visit.     REVIEW OF SYSTEMS:   Constitutional: Denies fevers, chills or abnormal night sweats (+) fatigue  Eyes: Denies blurriness of vision, double vision or watery eyes Ears, nose, mouth, throat, and face: Denies mucositis or sore throat Respiratory: Denies cough, dyspnea or wheezes Cardiovascular: Denies palpitation, chest discomfort or lower extremity swelling Gastrointestinal:  Denies vomiting, diarrhea, heartburn (+) rectal bleeding (+) rectal pain (+) nausea (+)  constipation (+) pencil thin stools Skin: Denies abnormal skin rashes Lymphatics: Denies new lymphadenopathy or easy bruising Neurological:Denies numbness, tingling or new weaknesses (+) episode of dizziness  Behavioral/Psych: Mood is stable, no new changes  All other systems were reviewed with the patient and are negative.  PHYSICAL EXAMINATION: ECOG PERFORMANCE STATUS: 1 - Symptomatic but completely ambulatory  Vitals:   06/14/19 0955  BP: (!) 166/92  Pulse: 68  Resp: 18  Temp: 98.5 F (36.9 C)  SpO2: 99%   Filed Weights   06/14/19 0955  Weight: 237 lb 1.6 oz (107.5 kg)    GENERAL:alert, no distress and comfortable SKIN: no rash  EYES: sclera clear LYMPH:  no palpable cervical, supraclavicular, axillary, or inguinal lymphadenopathy LUNGS: clear with normal breathing effort HEART: regular rate & rhythm, no lower extremity edema ABDOMEN: abdomen soft, non-tender and normal bowel sounds. No hepatomegaly  Rectal: external exam reveals no mass. There is a firm palpable mass just inside the anal canal on the right side wall. Blood on glove. Exam limited by patient's discomfort.  Musculoskeletal:no cyanosis of digits PSYCH: alert & oriented x 3 with fluent speech NEURO: no focal motor/sensory deficits  LABORATORY DATA:  I have reviewed the data as listed CBC Latest Ref Rng & Units 06/14/2019 05/22/2018 05/22/2018  WBC 4.0 - 10.5 K/uL 6.3 - 6.2  Hemoglobin 12.0 - 15.0 g/dL 14.0 13.6 12.9  Hematocrit 36.0 - 46.0 % 44.6 40.0 42.1  Platelets 150 - 400 K/uL 255 - 226   CMP Latest Ref  Rng & Units 06/14/2019 05/22/2018 05/22/2018  Glucose 70 - 99 mg/dL 105(H) 102(H) 108(H)  BUN 8 - 23 mg/dL _0 Creatinine 0.44 - 1.00 mg/dL 0.91 0.80 0.86  Sodium 135 - 145 mmol/L 141 142 143  Potassium 3.5 - 5.1 mmol/L 3.9 3.7 3.9  Chloride 98 - 111 mmol/L 109 107 110  CO2 22 - 32 mmol/L 26 - 26  Calcium 8.9 - 10.3 mg/dL 9.0 - 9.2  Total Protein 6.5 - 8.1 g/dL 6.8 - 6.9  Total Bilirubin 0.3 - 1.2  mg/dL 0.4 - 0.5  Alkaline Phos 38 - 126 U/L 106 - 98  AST 15 - 41 U/L 14(L) - 18  ALT 0 - 44 U/L 9 - 14     RADIOGRAPHIC STUDIES: I have personally reviewed the radiological images as listed and agreed with the findings in the report. No results found.  ASSESSMENT & PLAN: 68 yo female with   1. Anal Squamous Cell Carcinoma, [id number] -We reviewed her colonoscopy and biopsy with patient and her daughter in detail. She presented with rectal bleeding and anal pressure. Biopsy showed a 3 cm mass at the anorectal junction, path confirmed well to moderately differentiated squamous cell carcinoma. She has not had CT CAP  -We recommend PET scan to complete staging, will try to get approved asap but likely will not be done before RT consult on 10/1.  -We reviewed the nature of anal cancer, and the risk of recurrence after definitive treatment, which is determined by staging. -if PET scan shows no distant metastasis, she would be a candidate for definitive chemotherapy and radiation, which will likely cure the cancer with success rate of 80-90%. Surgery is usually reserved for residual disease or local recurrence after concurrent chemoradiation.  -She will meet with [name] on 10/1 to discuss radiation, will discuss when she can begin therapy -[name] recommend a standard chemotherapy with 5-FU and mitomycin on weeks 1 and 5 regimen with concurrent radiation --Chemotherapy consent: Side effects including but not limited to fatigue, nausea, vomiting, mucositis, diarrhea, neuropathy, hair loss, fluid retention, renal and kidney dysfunction, neutropenic fever, need for blood transfusion, bleeding, coronary artery spasm and heart attack, heart failure were discussed with patient and her daughter in great detail. She agrees to proceed. -The goal of therapy is curative.  -We plan to have a PICC line placed on her first day of chemotherapy and radiation.  -We will have lab and follow-up weekly during her  treatment.  -We discussed cancer surveillance, we will plan to repeat a PET scan in 3-4 months after she completes chemotherapy and radiation. She will be followed routinely with lab, exam and surveillance PET scan. -baseline labs today reveals normal hemoglobin. Iron studies in low-normal range, ferritin 15. I recommend to start oral iron once daily if she can tolerate, and to take with orange juice/vitamin C for better absorption. If this worsens, constipation, she can take every other day. CMP unremarkable.  -physical exam shows a firm mass just inside the anal canal. Exam was very painful for her.  -F/u in 1-2 weeks to start treatment   2. H/o bilateral breast cancer, followed by Dr. [name]  2007: right breast biopsy showed atypical hyperplasia, s/p right lumpectomy in 06/2006 ER/PR+/HER2-, grade 1. Completed tamoxifen from 09/2007 - 09/2012 2008: left breast invasive ductal carcinoma, triple negative, grade 3 s/p lumpectomy in 07/2007 and eventual bilateral mastectomy; s/p adjuvant chemo AC-T and radiation   3. Genetics -patient is adopted, no  known family history  -she reportedly had genetic testing which was negative. Her daughter also negative. I do not have the report.   4. HTN -on losartan -BP 166/92 today -will monitor on chemo, she may require dose adjustments and or discontinuation of BP meds during therapy if she experiences hypotension   PLAN: -Medical record reviewed -Staging PET scan on 10/5 -Rad onc consult 10/1 -Start oral iron once daily  -IR PICC placement, lab, f/u and chemo in 1-2 weeks pending RT planning  -Lab and f/u weekly on treatment    Orders Placed This Encounter  Procedures   NM PET Image Initial (PI) Skull Base To Thigh    Please schedule asap, radiation consult on 10/1    Standing Status:   Future    Standing Expiration Date:   06/13/2020    Order Specific Question:   If indicated for the ordered procedure, I authorize the administration of a  radiopharmaceutical per Radiology protocol    Answer:   Yes    Order Specific Question:   Preferred imaging location?    Answer:   [facility]    Order Specific Question:   Radiology Contrast Protocol - do NOT remove file path    Answer:   [email or web address]   CBC with Differential ([facility] Only)    Standing Status:   Standing    Number of Occurrences:   20    Standing Expiration Date:   06/13/2020   CMP ([facility] only)    Standing Status:   Standing    Number of Occurrences:   20    Standing Expiration Date:   06/13/2020   Iron and TIBC    Standing Status:   Standing    Number of Occurrences:   20    Standing Expiration Date:   06/13/2020   Ferritin    Standing Status:   Standing    Number of Occurrences:   20    Standing Expiration Date:   06/13/2020    All questions were answered. The patient knows to call the clinic with any problems, questions or concerns.     Alla Feeling, NP 06/15/2019 8:14 AM  Addendum  I have seen the patient, examined her. I agree with the assessment and and plan and have edited the notes.   Ms Gaeta presented with rectal pain and bleeding for 3 months. Her colonoscopy and biopsy showed a 3cm SCC at anorectal junction. I recommend staging PET scan. She is scheduled to see rad/onc later this week. If no evidence of distant metastasis on PET scan, I recommend definitive treatment with concurrent chemo (5-FU and mitomycin on week 1 and 5) and radiation.  We discussed this is a highly curable disease, however the treatment is intensive.  Potential benefit and side effects reviewed with her, she agrees with the recommendation.  Plan to see her back before or on first day of her chemo and radiation. PMH bilateral stage I-II breast cancer noticed, NED now.   Truitt Merle  06/14/2019

## 2019-06-14 NOTE — Progress Notes (Signed)
Met with pt during initial consult. Reviewed members of treatment team and provided direct contact information. Patient moved from [place] to live near her daughter and has had bilateral hip replacements and a knee replacement since her move. She reports that her daughter is her only source of support and that "my daughter needs mor support than I do."  Patient agreed that it would be helpful to speak to one of our LCSW's.  I will place the referral. Patient encouraged to call with questions or concerns. No barriers to treatment identified.

## 2019-06-14 NOTE — Telephone Encounter (Signed)
TC to pt per Cira Rue NP to let her know that I scheduled her PET scan on Monday October 5 @ 12:30. She is to be NPO 6 hours prior and also avoid carbs. Patient verbalized understanding. No further problems or concerns at this time.

## 2019-06-14 NOTE — Progress Notes (Signed)
START ON PATHWAY REGIMEN - Anal Carcinoma     Chemotherapy concurrent with RT:     Mitomycin      Fluorouracil   **Always confirm dose/schedule in your pharmacy ordering system**  Patient Characteristics: Anal and Anal Margin Tumors, Newly Diagnosed - Locoregional Disease Not Amenable to Local Excision AJCC T Category: T2 AJCC N Category: N0 AJCC M Category: M0 AJCC 8 Stage Grouping: IIA Current Disease Status: Newly Diagnosed - Locoregional Disease Not Amendable to Local Excision Intent of Therapy: Curative Intent, Discussed with Patient

## 2019-06-14 NOTE — Telephone Encounter (Signed)
TC to pt per Cira Rue NP to let her know her labs I let her know that she is not anemic. Iron/ferritin in low normal range. I told her that Lacie recommend oral iron once daily if she can tolerate, take with orange juice or vitamin C. If it makes constipation worse can take every other day. Patient verbalized understanding. No further problems or concerns at this time.

## 2019-06-15 ENCOUNTER — Other Ambulatory Visit: Payer: Self-pay | Admitting: Nurse Practitioner

## 2019-06-15 ENCOUNTER — Encounter: Payer: Self-pay | Admitting: Nurse Practitioner

## 2019-06-15 ENCOUNTER — Telehealth: Payer: Self-pay | Admitting: Nurse Practitioner

## 2019-06-15 DIAGNOSIS — C210 Malignant neoplasm of anus, unspecified: Secondary | ICD-10-CM

## 2019-06-15 NOTE — Progress Notes (Signed)
Faxed Lacie Burton's-NP most recent consult note to Dr. Estell Harpin office at [place] [phone]

## 2019-06-15 NOTE — Telephone Encounter (Signed)
No los per 9/28. °

## 2019-06-16 ENCOUNTER — Telehealth: Payer: Self-pay | Admitting: Nurse Practitioner

## 2019-06-16 NOTE — Progress Notes (Signed)
GI Location of Tumor / Histology: Anal Cancer  Cynthia Velasquez presented to GI on 05/14/2019 with 2 month history of BRBPR in the presence of known hemorrhoids with associated anal pressure and abdominal cramping.  PET 06/21/2019:   Colonoscopy 06/04/2019: digital rectal exam revealed a 3 cm diameter firm rectal mass.  The mass was non-circumferential and located predominantly at the right bowel wall at the anorectal junction.  A non-obstructing mass was found at the anus and in the rectum.  Biopsies of Rectum/Large Intestine 06/04/2019   Past/Anticipated interventions by surgeon, if any:   Past/Anticipated interventions by medical oncology, if any:  [name] 06/14/2019 -We recommend PET scan to complete staging, will try to get approved asap but likely will not be done before RT consult on 10/1.  -We reviewed the nature of anal cancer, and the risk of recurrence after definitive treatment, which is determined by staging. -if PET scan shows no distant metastasis, she would be a candidate for definitive chemotherapy and radiation, which will likely cure the cancer with success rate of 80-90%. Surgery is usually reserved for residual disease or local recurrence after concurrent chemoradiation.  -She will meet with [name] on 10/1 to discuss radiation, will discuss when she can begin therapy -[name] recommend a standard chemotherapy with 5-FU and mitomycin on weeks 1 and 5 regimen with concurrent radiation  Weight changes, if any: Lost about 10 pounds  Bowel/Bladder complaints, if any: Taking linzess and  miralax, bowels are sluggish.  Urinary depends on bowel function.    Nausea / Vomiting, if any: No vomiting, occasional nausea.  Pain issues, if any:  10/10 pain in her buttock  Any blood per rectum:  Yes   SAFETY ISSUES:  Prior radiation? Bilateral breast 03/08/09-05/08/09  Pacemaker/ICD? No  Possible current pregnancy? Hysterectomy  Is the patient on methotrexate? No  Current  Complaints/Details: -PICC placement 06/28/2019 -Had bilateral mastectomy 11/24/2007- chemo tamoxifen

## 2019-06-16 NOTE — Telephone Encounter (Signed)
Scheduled appt per 9/29 sch message- unable to reach pt. Left message with appt date and time  

## 2019-06-17 ENCOUNTER — Ambulatory Visit
Admission: RE | Admit: 2019-06-17 | Discharge: 2019-06-17 | Disposition: A | Discharge status: Home / Self Care | Payer: PPO | Source: Ambulatory Visit | Attending: Radiation Oncology | Admitting: Radiation Oncology

## 2019-06-17 ENCOUNTER — Encounter: Payer: Self-pay | Admitting: Radiation Oncology

## 2019-06-17 ENCOUNTER — Other Ambulatory Visit: Payer: Self-pay

## 2019-06-17 VITALS — Ht 65.0 in | Wt 232.0 lb

## 2019-06-17 DIAGNOSIS — C50411 Malignant neoplasm of upper-outer quadrant of right female breast: Secondary | ICD-10-CM

## 2019-06-17 DIAGNOSIS — C211 Malignant neoplasm of anal canal: Secondary | ICD-10-CM | POA: Diagnosis not present

## 2019-06-17 DIAGNOSIS — Z853 Personal history of malignant neoplasm of breast: Secondary | ICD-10-CM | POA: Diagnosis not present

## 2019-06-17 DIAGNOSIS — G893 Neoplasm related pain (acute) (chronic): Secondary | ICD-10-CM | POA: Diagnosis not present

## 2019-06-17 DIAGNOSIS — C210 Malignant neoplasm of anus, unspecified: Secondary | ICD-10-CM

## 2019-06-17 MED ORDER — HYDROMORPHONE HCL 2 MG PO TABS: 1.0000 mg | ORAL_TABLET | ORAL | 0 refills | Status: DC | PRN

## 2019-06-17 NOTE — Progress Notes (Signed)
Radiation Oncology         [phone] ________________________________  Name: Cynthia Velasquez        MRN: [id number]  Date of Service: 06/17/2019 DOB: Feb 27, 1951  [id number], Lattie Haw, MD  Truitt Merle, MD     REFERRING PHYSICIAN: Truitt Merle, MD   DIAGNOSIS: The encounter diagnosis was Malignant neoplasm of upper-outer quadrant of right female breast, unspecified estrogen receptor status ([place]).   HISTORY OF PRESENT ILLNESS: Cynthia Velasquez is a 68 y.o. female seen at the request of [name] for a newly diagnosed squamous cell carcinoma of the anus. The patient has a history  ER/PR positive breast cancer on the right in 2008. She underwent right lumpectomy and sentinel node biopsy followed by antiestrogen therapy and adjuvant radiotherapy. She then was diagnosed with left triple negative disease the same year, underwent bilateral mastectomies and completed adjuvant chemotherapy. She has been NED for many years. She relocated to [facility] in about 2014.  She recently starting noticing progressive anal pressure and abdominal cramping. She has a history of hemorrhoids and a colonoscopy on 06/04/2019 revealed a 3 cm firm mass on DRE and biopsies of the mass revealed a well to moderately differentiated squamous cell carcinoma. She is scheduled for PET imaging on 06/21/2019, and simulation on 06/22/19 and is seen today via MyChart to discuss treatment recommendations.   PREVIOUS RADIATION THERAPY: Yes   2008: Post lumpectomy adjuvant radiotherapy to the right breast    PAST MEDICAL HISTORY:  Past Medical History:  Diagnosis Date  . Anxiety   . Arthritis   . Bilateral breast cancer ([place]) 10/21/2013  . Breast cancer ([place])   . Cancer ([place])   . Depression   . Gallstones   . GERD (gastroesophageal reflux disease)    doesn't take any meds for this  . Headache    h/o migraines      . History of bladder infections   . History of hiatal hernia   . History of migraine   . Hyperlipidemia    takes Simvastatin  daily  . Hypertension    takes Hyzaar  . Joint pain   . Joint swelling   . Lumbar stenosis   . Neuropathy 09/07/2013  . Pneumonia   . Pre-diabetes        PAST SURGICAL HISTORY: Past Surgical History:  Procedure Laterality Date  . ABDOMINAL HYSTERECTOMY    . bone spur removed from left foot    . CHOLECYSTECTOMY    . COLONOSCOPY    . double mastectomy     . HERNIA REPAIR     umbilical  . right knee arthroscopy    . right shoulder arthroscopy    . surgery for hiatal hernia    . TONSILLECTOMY    . TOTAL HIP ARTHROPLASTY Left 02/12/2013  . TOTAL HIP ARTHROPLASTY Left 02/12/2013   Procedure: LEFT TOTAL HIP ARTHROPLASTY;  Surgeon: Kerin Salen, MD;  Location: [facility];  Service: Orthopedics;  Laterality: Left;  . TOTAL HIP ARTHROPLASTY Right 05/03/2013   Procedure: TOTAL HIP ARTHROPLASTY;  Surgeon: Kerin Salen, MD;  Location: [facility];  Service: Orthopedics;  Laterality: Right;  . TOTAL KNEE ARTHROPLASTY Right 02/27/2015   Procedure: TOTAL KNEE ARTHROPLASTY;  Surgeon: Frederik Pear, MD;  Location: [facility];  Service: Orthopedics;  Laterality: Right;  . TOTAL SHOULDER ARTHROPLASTY Right 09/12/2016   Procedure: RIGHT TOTAL SHOULDER ARTHROPLASTY;  Surgeon: Tania Ade, MD;  Location: [facility];  Service: Orthopedics;  Laterality: Right;  RIGHT TOTAL SHOULDER ARTHROPLASTY  FAMILY HISTORY:  Family History  Adopted: Yes  Problem Relation Age of Onset  . Allergic rhinitis Neg Hx   . Angioedema Neg Hx   . Atopy Neg Hx   . Eczema Neg Hx   . Immunodeficiency Neg Hx   . Urticaria Neg Hx   . Asthma Neg Hx      SOCIAL HISTORY:  reports that she has never smoked. She has never used smokeless tobacco. She reports that she does not drink alcohol or use drugs. The patient is single and lives in [place]. 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 She has been working remotely since [employer].   ALLERGIES: Oxycodone-acetaminophen, Codeine, and Vicodin  [hydrocodone-acetaminophen]   MEDICATIONS:  Current Outpatient Medications  Medication Sig Dispense Refill  . cephALEXin (KEFLEX) 500 MG capsule Take 1 capsule (500 mg total) by mouth 2 (two) times daily. 14 capsule 0  . dicyclomine (BENTYL) 20 MG tablet Take 1 tablet (20 mg total) by mouth 2 (two) times daily. 20 tablet 0  . docusate sodium (COLACE) 100 MG capsule Take 1 capsule (100 mg total) by mouth 3 (three) times daily as needed. (Patient not taking: Reported on 02/26/2018) 20 capsule 0  . losartan (COZAAR) 100 MG tablet TK 1 T PO D  6  . meclizine (ANTIVERT) 25 MG tablet Take 1 tablet (25 mg total) by mouth 3 (three) times daily as needed for dizziness. 10 tablet 0  . montelukast (SINGULAIR) 10 MG tablet TAKE 1 TABLET(10 MG) BY MOUTH AT BEDTIME (Patient taking differently: TAKE 1 TABLET(10 MG) BY MOUTH AT DAILY AS NEEDED FOR ALLERGIES) 90 tablet 1  . nitroGLYCERIN (NITROSTAT) 0.4 MG SL tablet Place 0.4 mg under the tongue every 5 (five) minutes x 3 doses as needed for chest pain.  0  . Olopatadine HCl (PATADAY) 0.2 % SOLN Place 1 drop into both eyes daily. (Patient not taking: Reported on 02/26/2018) 1 Bottle 5  . omeprazole (PRILOSEC) 20 MG capsule Take 1 capsule (20 mg total) by mouth daily. 14 capsule 0  . ondansetron (ZOFRAN ODT) 4 MG disintegrating tablet Take 1 tablet (4 mg total) by mouth every 8 (eight) hours as needed for nausea or vomiting. 6 tablet 0  . polyethylene glycol (MIRALAX / GLYCOLAX) packet Take 17 g by mouth daily as needed for moderate constipation.    [name] Kitchen PROAIR HFA 108 (90 Base) MCG/ACT inhaler 2 inhalations every 4-6 hours as needed. (Patient taking differently: Inhale 2 puffs into the lungs every 6 (six) hours as needed for wheezing or shortness of breath. ) 18 g 1  . ranitidine (ZANTAC) 150 MG tablet Take 150 mg by mouth 2 (two) times daily as needed for heartburn.    . rosuvastatin (CRESTOR) 20 MG tablet Take 20 mg by mouth every evening.  3  . sucralfate  (CARAFATE) 1 g tablet Take 1 tablet (1 g total) by mouth 4 (four) times daily -  with meals and at bedtime. 30 tablet 0  . traMADol (ULTRAM) 50 MG tablet Take 1 tablet (50 mg total) by mouth every 6 (six) hours as needed. 30 tablet 0   No current facility-administered medications for this encounter.      REVIEW OF SYSTEMS: On review of systems, the patient reports that she is having terrible pain usually worse at night and constant bleeding from the anal area. She reports this is also worse at night. She has pencil thin stools. She denies any chest pain, shortness of  breath, cough, fevers, chills, night sweats, unintended weight changes. She denies any  bladder disturbances, and denies abdominal pain, nausea or vomiting. She denies any new musculoskeletal or joint aches or pains. A complete review of systems is obtained and is otherwise negative.     PHYSICAL EXAM:  In general this is a well appearing African American female in no acute distress. She's alert and oriented x4 and appropriate throughout the examination. Cardiopulmonary assessment is negative for acute distress and she exhibits normal effort.    ECOG = 1  0 - Asymptomatic (Fully active, able to carry on all predisease activities without restriction)  1 - Symptomatic but completely ambulatory (Restricted in physically strenuous activity but ambulatory and able to carry out work of a light or sedentary nature. For example, light housework, office work)  2 - Symptomatic, <50% in bed during the day (Ambulatory and capable of all self care but unable to carry out any work activities. Up and about more than 50% of waking hours)  3 - Symptomatic, >50% in bed, but not bedbound (Capable of only limited self-care, confined to bed or chair 50% or more of waking hours)  4 - Bedbound (Completely disabled. Cannot carry on any self-care. Totally confined to bed or chair)  5 - Death   Eustace Pen MM, Creech RH, Tormey DC, et al. [phone]). "Toxicity  and response criteria of the [facility]". [place] Oncol. 5 (6): 649-55    LABORATORY DATA:  Lab Results  Component Value Date   WBC 6.3 06/14/2019   HGB 14.0 06/14/2019   HCT 44.6 06/14/2019   MCV 83.7 06/14/2019   PLT 255 06/14/2019   Lab Results  Component Value Date   NA 141 06/14/2019   K 3.9 06/14/2019   CL 109 06/14/2019   CO2 26 06/14/2019   Lab Results  Component Value Date   ALT 9 06/14/2019   AST 14 (L) 06/14/2019   ALKPHOS 106 06/14/2019   BILITOT 0.4 06/14/2019      RADIOGRAPHY: No results found.     IMPRESSION/PLAN: 1. Squamous Cell Carcinoma of the Anus. [name] discusses the pathology findings and reviews the nature of anal carcinomas. We discussed the rationale to proceed with PET imaging for staging purposes. She is scheduled for this on Monday and we will follow up with the results when available. We discussed the risks, benefits, short, and long term effects of radiotherapy, and the patient is interested in proceeding. [name] discusses the delivery and logistics of radiotherapy and anticipates a course of 6 weeks of radiotherapy to the anus and regional nodes. We will plan on simulation on Tuesday next week and will sign written consent at that time.  2. Pain secondary to #1. The patient has had itching despite tramadol and has an itching allergy with other codone/codeine products. I have sent in a prescription to her pharmacy to try low strength Dilaudid and spoken with the pharmacist there to make sure he agreed that this was a safe alternative to avoid allergy.   This encounter was provided by telemedicine platform MyChart.  The patient has given verbal consent for this type of encounter and has been advised to only accept a meeting of this type in a secure network environment. The time spent during this encounter was 60 minutes. The attendants for this meeting include Blenda Nicely, RN, [name], Hayden Pedro   and [name].  During the encounter,  Blenda Nicely, RN, [name], and  Hayden Pedro were located at [facility] Radiation Oncology Department.  Pierson Hunnicutt was located at home.   The above documentation reflects my direct findings during this shared patient visit. Please see the separate note by [name] on this date for the remainder of the patient's plan of care.    Carola Rhine, PAC

## 2019-06-18 ENCOUNTER — Encounter: Payer: Self-pay | Admitting: General Practice

## 2019-06-18 NOTE — Progress Notes (Signed)
[place] Psychosocial Distress Screening Clinical Social Work  Clinical Social Work was referred by distress screening protocol.  The patient scored a 10 on the Psychosocial Distress Thermometer which indicates severe distress. Clinical Social Worker contacted patient by phone to assess for distress and other psychosocial needs.   ONCBCN DISTRESS SCREENING 06/17/2019  Screening Type Initial Screening  Distress experienced in past week (1-10) 10  Practical problem type Work/school  Family Problem type Children  Emotional problem type Adjusting to illness   Clinical Social Worker follow up needed: Yes.    If yes, follow up plan:  Left VM and requested she call me back.  CSW [place] alerted to need as well.  Beverely Pace, [place], LCSW Clinical Social Worker Phone:  [phone]

## 2019-06-21 ENCOUNTER — Ambulatory Visit: Payer: PPO | Admitting: Hematology

## 2019-06-21 ENCOUNTER — Encounter (HOSPITAL_COMMUNITY)
Admission: RE | Admit: 2019-06-21 | Discharge: 2019-06-21 | Disposition: A | Discharge status: Home / Self Care | Payer: PPO | Source: Ambulatory Visit | Attending: Nurse Practitioner | Admitting: Nurse Practitioner

## 2019-06-21 ENCOUNTER — Other Ambulatory Visit: Payer: Self-pay

## 2019-06-21 DIAGNOSIS — I10 Essential (primary) hypertension: Secondary | ICD-10-CM | POA: Insufficient documentation

## 2019-06-21 DIAGNOSIS — C210 Malignant neoplasm of anus, unspecified: Secondary | ICD-10-CM

## 2019-06-21 DIAGNOSIS — C44520 Squamous cell carcinoma of anal skin: Secondary | ICD-10-CM | POA: Diagnosis not present

## 2019-06-21 DIAGNOSIS — Z79899 Other long term (current) drug therapy: Secondary | ICD-10-CM | POA: Insufficient documentation

## 2019-06-21 DIAGNOSIS — E785 Hyperlipidemia, unspecified: Secondary | ICD-10-CM | POA: Insufficient documentation

## 2019-06-21 LAB — GLUCOSE, CAPILLARY: Glucose-Capillary: 89 mg/dL (ref 70–99)

## 2019-06-21 MED ORDER — FLUDEOXYGLUCOSE F - 18 (FDG) INJECTION
12.4000 | Freq: Once | INTRAVENOUS | Status: AC | PRN
  Administered 2019-06-21: 13:00:00 12 via INTRAVENOUS

## 2019-06-22 ENCOUNTER — Ambulatory Visit
Admission: RE | Admit: 2019-06-22 | Discharge: 2019-06-22 | Disposition: A | Discharge status: Home / Self Care | Payer: PPO | Source: Ambulatory Visit | Attending: Radiation Oncology | Admitting: Radiation Oncology

## 2019-06-22 ENCOUNTER — Other Ambulatory Visit: Payer: Self-pay

## 2019-06-22 ENCOUNTER — Encounter: Payer: Self-pay | Admitting: *Deleted

## 2019-06-22 ENCOUNTER — Telehealth: Payer: Self-pay

## 2019-06-22 DIAGNOSIS — Z510 Encounter for antineoplastic radiation therapy: Secondary | ICD-10-CM | POA: Diagnosis not present

## 2019-06-22 DIAGNOSIS — C210 Malignant neoplasm of anus, unspecified: Secondary | ICD-10-CM | POA: Diagnosis not present

## 2019-06-22 DIAGNOSIS — C211 Malignant neoplasm of anal canal: Secondary | ICD-10-CM | POA: Diagnosis not present

## 2019-06-22 NOTE — Progress Notes (Signed)
The patient came in for simulation today. We discussed her symptoms have increased and she wants to lay down more than sit at this time due to progressive pain. She has tolerated the dilaudid and it seems to help her pain, but she's had a hard time sleeping. She is counseled on some otc medications that may help symptoms during treatment and I signed her FMLA paperwork so she can be out of work for approx 12 weeks starting 06/23/2019. She will let us know if she has any further difficulty or needs other forms to be filled out.

## 2019-06-22 NOTE — Progress Notes (Signed)
[place] Psychosocial Distress Screening Clinical Social Work  Clinical Social Work was referred by distress screening protocol.  The patient scored a 10 on the Psychosocial Distress Thermometer which indicates severe distress. Clinical Social Worker contacted patient at home to assess for distress and other psychosocial needs.  Patient stated she had SIM today, and was doing well until today.  Patient stated "I think it finally hit me that this is real".  CSW and patient discussed common feeling and emotions when going through treatment and the importance of support.  Patient stated her daughter was positive support, but worries about the impact of diagnosis and treatment on her daughter.  CSW and patient discussed support services and programs at [facility].  Patient expressed interest in support groups, counseling, and mentor programs.  Patient was agreeable to a referral to the [facility] counseling intern and being added to the support calendar mailing list.  CSW and patient also discussed transportation resources.  CSW provided contact information and encouraged patient to call with questions or concerns.         ONCBCN DISTRESS SCREENING 06/17/2019  Screening Type Initial Screening  Distress experienced in past week (1-10) 10  Practical problem type Work/school  Family Problem type Children  Emotional problem type Adjusting to illness    Johnnye Lana, MSW, LCSW, OSW-C Clinical Social Worker [place] [phone]

## 2019-06-22 NOTE — Telephone Encounter (Signed)
TC from Pt. Requesting results from Pet Scan she had yesterday 06/21/19 In basket sent to [facility]

## 2019-06-23 ENCOUNTER — Telehealth: Payer: Self-pay | Admitting: Nurse Practitioner

## 2019-06-23 NOTE — Telephone Encounter (Signed)
Scheduled appt per 10/7 sch message - unable to reach pt - left message with appt date and time

## 2019-06-23 NOTE — Telephone Encounter (Signed)
After reviewing with [name], I called [name] to inform her of PET scan results which shows no metastasis in chest, abdomen, or pelvis. Will continue with anticipated treatment plan, she will begin chemoRT on 10/12. She has more rectal bleeding and pain which is better managed with dilaudid PRN. She also just began short term disability which has been good for her. She has not met with chemo education RN yet, will send a schedule message to arrange this before Monday. We reviewed her schedule and chemo plan. She verbalizes understanding and appreciates the call.  Cira Rue, NP  06/23/2019

## 2019-06-24 ENCOUNTER — Telehealth: Payer: Self-pay

## 2019-06-24 NOTE — Telephone Encounter (Signed)
Called patient to touch base and to answer any questions she might have and to review next week's appointments including targeted start date for concurrent chemo radiation. Patient is looking forward to chemo education class. All questions answered to her satisfaction. Will follow as needed. She understands that she can call with questions or support.

## 2019-06-25 ENCOUNTER — Inpatient Hospital Stay: Payer: PPO | Attending: Hematology

## 2019-06-25 ENCOUNTER — Other Ambulatory Visit: Payer: Self-pay

## 2019-06-25 DIAGNOSIS — D649 Anemia, unspecified: Secondary | ICD-10-CM | POA: Insufficient documentation

## 2019-06-25 DIAGNOSIS — Z5111 Encounter for antineoplastic chemotherapy: Secondary | ICD-10-CM | POA: Insufficient documentation

## 2019-06-25 DIAGNOSIS — C211 Malignant neoplasm of anal canal: Secondary | ICD-10-CM | POA: Diagnosis not present

## 2019-06-25 DIAGNOSIS — Z23 Encounter for immunization: Secondary | ICD-10-CM | POA: Insufficient documentation

## 2019-06-25 DIAGNOSIS — C210 Malignant neoplasm of anus, unspecified: Secondary | ICD-10-CM | POA: Insufficient documentation

## 2019-06-25 DIAGNOSIS — Z79899 Other long term (current) drug therapy: Secondary | ICD-10-CM | POA: Insufficient documentation

## 2019-06-25 DIAGNOSIS — Z510 Encounter for antineoplastic radiation therapy: Secondary | ICD-10-CM | POA: Diagnosis not present

## 2019-06-28 ENCOUNTER — Inpatient Hospital Stay (HOSPITAL_BASED_OUTPATIENT_CLINIC_OR_DEPARTMENT_OTHER): Payer: PPO | Admitting: Nurse Practitioner

## 2019-06-28 ENCOUNTER — Other Ambulatory Visit: Payer: Self-pay

## 2019-06-28 ENCOUNTER — Encounter: Payer: Self-pay | Admitting: Nurse Practitioner

## 2019-06-28 ENCOUNTER — Encounter: Payer: Self-pay | Admitting: Hematology

## 2019-06-28 ENCOUNTER — Inpatient Hospital Stay: Payer: PPO

## 2019-06-28 ENCOUNTER — Ambulatory Visit (HOSPITAL_COMMUNITY)
Admission: RE | Admit: 2019-06-28 | Discharge: 2019-06-28 | Disposition: A | Discharge status: Home / Self Care | Payer: PPO | Source: Ambulatory Visit | Attending: Nurse Practitioner | Admitting: Nurse Practitioner

## 2019-06-28 ENCOUNTER — Other Ambulatory Visit: Payer: Self-pay | Admitting: Nurse Practitioner

## 2019-06-28 ENCOUNTER — Telehealth: Payer: Self-pay | Admitting: Hematology

## 2019-06-28 ENCOUNTER — Ambulatory Visit
Admission: RE | Admit: 2019-06-28 | Discharge: 2019-06-28 | Disposition: A | Discharge status: Home / Self Care | Payer: PPO | Source: Ambulatory Visit | Attending: Radiation Oncology | Admitting: Radiation Oncology

## 2019-06-28 VITALS — BP 151/90 | HR 63 | Temp 98.3°F | Resp 17 | Ht 65.0 in | Wt 233.6 lb

## 2019-06-28 DIAGNOSIS — Z5111 Encounter for antineoplastic chemotherapy: Secondary | ICD-10-CM | POA: Diagnosis not present

## 2019-06-28 DIAGNOSIS — R300 Dysuria: Secondary | ICD-10-CM

## 2019-06-28 DIAGNOSIS — Z23 Encounter for immunization: Secondary | ICD-10-CM

## 2019-06-28 DIAGNOSIS — C210 Malignant neoplasm of anus, unspecified: Secondary | ICD-10-CM

## 2019-06-28 DIAGNOSIS — Z452 Encounter for adjustment and management of vascular access device: Secondary | ICD-10-CM

## 2019-06-28 DIAGNOSIS — Z79899 Other long term (current) drug therapy: Secondary | ICD-10-CM | POA: Diagnosis not present

## 2019-06-28 DIAGNOSIS — D649 Anemia, unspecified: Secondary | ICD-10-CM | POA: Diagnosis not present

## 2019-06-28 DIAGNOSIS — C211 Malignant neoplasm of anal canal: Secondary | ICD-10-CM | POA: Diagnosis not present

## 2019-06-28 DIAGNOSIS — Z510 Encounter for antineoplastic radiation therapy: Secondary | ICD-10-CM | POA: Diagnosis not present

## 2019-06-28 LAB — CBC WITH DIFFERENTIAL (CANCER CENTER ONLY)
Abs Immature Granulocytes: 0.01 10*3/uL (ref 0.00–0.07)
Basophils Absolute: 0 10*3/uL (ref 0.0–0.1)
Basophils Relative: 1 %
Eosinophils Absolute: 0.5 10*3/uL (ref 0.0–0.5)
Eosinophils Relative: 7 %
HCT: 42 % (ref 36.0–46.0)
Hemoglobin: 13.4 g/dL (ref 12.0–15.0)
Immature Granulocytes: 0 %
Lymphocytes Relative: 31 %
Lymphs Abs: 2.1 10*3/uL (ref 0.7–4.0)
MCH: 26.4 pg (ref 26.0–34.0)
MCHC: 31.9 g/dL (ref 30.0–36.0)
MCV: 82.8 fL (ref 80.0–100.0)
Monocytes Absolute: 0.6 10*3/uL (ref 0.1–1.0)
Monocytes Relative: 8 %
Neutro Abs: 3.5 10*3/uL (ref 1.7–7.7)
Neutrophils Relative %: 53 %
Platelet Count: 262 10*3/uL (ref 150–400)
RBC: 5.07 MIL/uL (ref 3.87–5.11)
RDW: 14.5 % (ref 11.5–15.5)
WBC Count: 6.7 10*3/uL (ref 4.0–10.5)
nRBC: 0 % (ref 0.0–0.2)

## 2019-06-28 LAB — CMP (CANCER CENTER ONLY)
ALT: 9 U/L (ref 0–44)
AST: 15 U/L (ref 15–41)
Albumin: 3.5 g/dL (ref 3.5–5.0)
Alkaline Phosphatase: 98 U/L (ref 38–126)
Anion gap: 8 (ref 5–15)
BUN: 13 mg/dL (ref 8–23)
CO2: 26 mmol/L (ref 22–32)
Calcium: 9.2 mg/dL (ref 8.9–10.3)
Chloride: 107 mmol/L (ref 98–111)
Creatinine: 0.92 mg/dL (ref 0.44–1.00)
GFR, Est AFR Am: >60 mL/min (ref >60)
GFR, Estimated: >60 mL/min (ref >60)
Glucose, Bld: 97 mg/dL (ref 70–99)
Potassium: 3.6 mmol/L (ref 3.5–5.1)
Sodium: 141 mmol/L (ref 135–145)
Total Bilirubin: 0.4 mg/dL (ref 0.3–1.2)
Total Protein: 6.7 g/dL (ref 6.5–8.1)

## 2019-06-28 LAB — URINALYSIS, COMPLETE (UACMP) WITH MICROSCOPIC
Bilirubin Urine: NEGATIVE
Glucose, UA: NEGATIVE mg/dL
Ketones, ur: NEGATIVE mg/dL
Leukocytes,Ua: NEGATIVE
Nitrite: NEGATIVE
Protein, ur: NEGATIVE mg/dL
Specific Gravity, Urine: 1.015 (ref 1.005–1.030)
pH: 5 (ref 5.0–8.0)

## 2019-06-28 MED ORDER — INFLUENZA VAC A&B SA ADJ QUAD 0.5 ML IM PRSY
0.5000 mL | PREFILLED_SYRINGE | Freq: Once | INTRAMUSCULAR | Status: AC
  Administered 2019-06-28: 0.5 mL via INTRAMUSCULAR

## 2019-06-28 MED ORDER — LIDOCAINE HCL 1 % IJ SOLN
INTRAMUSCULAR | Status: DC | PRN
  Administered 2019-06-28: 5 mL

## 2019-06-28 MED ORDER — SODIUM CHLORIDE 0.9% FLUSH
10.0000 mL | INTRAVENOUS | Status: DC | PRN
  Filled 2019-06-28: qty 10

## 2019-06-28 MED ORDER — INFLUENZA VAC A&B SA ADJ QUAD 0.5 ML IM PRSY
PREFILLED_SYRINGE | INTRAMUSCULAR | Status: AC
  Filled 2019-06-28: qty 0.5

## 2019-06-28 MED ORDER — SODIUM CHLORIDE 0.9 % IV SOLN
1000.0000 mg/m2/d | INTRAVENOUS | Status: DC
  Administered 2019-06-28: 8900 mg via INTRAVENOUS
  Filled 2019-06-28: qty 178

## 2019-06-28 MED ORDER — SODIUM CHLORIDE 0.9% FLUSH
10.0000 mL | INTRAVENOUS | Status: DC | PRN
  Administered 2019-06-28: 10 mL via INTRAVENOUS
  Filled 2019-06-28: qty 10

## 2019-06-28 MED ORDER — MITOMYCIN CHEMO IV INJECTION 40 MG
10.0000 mg/m2 | Freq: Once | INTRAVENOUS | Status: AC
  Administered 2019-06-28: 22 mg via INTRAVENOUS
  Filled 2019-06-28: qty 44

## 2019-06-28 MED ORDER — PROCHLORPERAZINE MALEATE 10 MG PO TABS
ORAL_TABLET | ORAL | Status: AC
  Filled 2019-06-28: qty 1

## 2019-06-28 MED ORDER — LIDOCAINE HCL 1 % IJ SOLN
INTRAMUSCULAR | Status: AC
  Filled 2019-06-28: qty 20

## 2019-06-28 MED ORDER — SODIUM CHLORIDE 0.9 % IV SOLN
Freq: Once | INTRAVENOUS | Status: AC
  Administered 2019-06-28: 13:00:00 via INTRAVENOUS
  Filled 2019-06-28: qty 250

## 2019-06-28 MED ORDER — HEPARIN SOD (PORK) LOCK FLUSH 100 UNIT/ML IV SOLN
250.0000 [IU] | Freq: Once | INTRAVENOUS | Status: DC | PRN
  Filled 2019-06-28: qty 5

## 2019-06-28 MED ORDER — PROCHLORPERAZINE MALEATE 10 MG PO TABS
10.0000 mg | ORAL_TABLET | Freq: Once | ORAL | Status: AC
  Administered 2019-06-28: 10 mg via ORAL

## 2019-06-28 NOTE — Progress Notes (Signed)
Met w/ pt to introduce myself as her Arboriculturist.  Unfortunately there aren't any foundations offering copay assistance for her Dx and the type of ins she has.  I offered the [place], went over what it covers, gave her the income requirement and an expense sheet.  She would like to apply so she will bring proof of income on 06/29/19.  She has my card for any questions or concerns she may have in the future.

## 2019-06-28 NOTE — Progress Notes (Signed)
Referral placed for transportation resources.

## 2019-06-28 NOTE — Procedures (Signed)
Pre procedural Diagnosis: Poor venous access Post Procedural Diagnosis: Same  Successful placement of right basilic vein approach 41 cm single lumen PICC line with tip at the superior caval-atrial junction.    EBL: None  No immediate post procedural complication.  The PICC line is ready for immediate use.  Ronny Bacon, MD Pager #: [phone]

## 2019-06-28 NOTE — Patient Instructions (Addendum)
[facility] Discharge Instructions for Patients Receiving Chemotherapy  Today you received the following chemotherapy agents: Mitomycin/5FU  To help prevent nausea and vomiting after your treatment, we encourage you to take your nausea medication as directed.   If you develop nausea and vomiting that is not controlled by your nausea medication, call the clinic.   BELOW ARE SYMPTOMS THAT SHOULD BE REPORTED IMMEDIATELY:  *FEVER GREATER THAN 100.5 F  *CHILLS WITH OR WITHOUT FEVER  NAUSEA AND VOMITING THAT IS NOT CONTROLLED WITH YOUR NAUSEA MEDICATION  *UNUSUAL SHORTNESS OF BREATH  *UNUSUAL BRUISING OR BLEEDING  TENDERNESS IN MOUTH AND THROAT WITH OR WITHOUT PRESENCE OF ULCERS  *URINARY PROBLEMS  *BOWEL PROBLEMS  UNUSUAL RASH Items with * indicate a potential emergency and should be followed up as soon as possible.  Feel free to call the clinic should you have any questions or concerns. The clinic phone number is [phone].  Please show the [place] at check-in to the Emergency Department and triage nurse.  Mitomycin injection What is this medicine? MITOMYCIN (mye toe MYE sin) is a chemotherapy drug. This medicine is used to treat cancer of the stomach and pancreas. This medicine may be used for other purposes; ask your health care provider or pharmacist if you have questions. COMMON BRAND NAME(S): Mutamycin What should I tell my health care provider before I take this medicine? They need to know if you have any of these conditions:  anemia  bleeding disorder  infection (especially a virus infection such as chickenpox, cold sores, or herpes)  kidney disease  low blood counts like low platelets, red blood cells, white blood cells  recent radiation therapy  an unusual or allergic reaction to mitomycin, other chemotherapy agents, other medicines, foods, dyes, or preservatives  pregnant or trying to get pregnant  breast-feeding How should  I use this medicine? This drug is given as an injection or infusion into a vein. It is administered in a hospital or clinic by a specially trained health care professional. Talk to your pediatrician regarding the use of this medicine in children. Special care may be needed. Overdosage: If you think you have taken too much of this medicine contact a poison control center or emergency room at once. NOTE: This medicine is only for you. Do not share this medicine with others. What if I miss a dose? It is important not to miss your dose. Call your doctor or health care professional if you are unable to keep an appointment. What may interact with this medicine?  medicines to increase blood counts like filgrastim, pegfilgrastim, sargramostim  vaccines This list may not describe all possible interactions. Give your health care provider a list of all the medicines, herbs, non-prescription drugs, or dietary supplements you use. Also tell them if you smoke, drink alcohol, or use illegal drugs. Some items may interact with your medicine. What should I watch for while using this medicine? Your condition will be monitored carefully while you are receiving this medicine. You will need important blood work done while you are taking this medicine. This drug may make you feel generally unwell. This is not uncommon, as chemotherapy can affect healthy cells as well as cancer cells. Report any side effects. Continue your course of treatment even though you feel ill unless your doctor tells you to stop. Call your doctor or health care professional for advice if you get a fever, chills or sore throat, or other symptoms of a cold or flu. Do  not treat yourself. This drug decreases your body's ability to fight infections. Try to avoid being around people who are sick. This medicine may increase your risk to bruise or bleed. Call your doctor or health care professional if you notice any unusual bleeding. Be careful brushing  and flossing your teeth or using a toothpick because you may get an infection or bleed more easily. If you have any dental work done, tell your dentist you are receiving this medicine. Avoid taking products that contain aspirin, acetaminophen, ibuprofen, naproxen, or ketoprofen unless instructed by your doctor. These medicines may hide a fever. Do not become pregnant while taking this medicine. Women should inform their doctor if they wish to become pregnant or think they might be pregnant. There is a potential for serious side effects to an unborn child. Talk to your health care professional or pharmacist for more information. Do not breast-feed an infant while taking this medicine. What side effects may I notice from receiving this medicine? Side effects that you should report to your doctor or health care professional as soon as possible:  allergic reactions like skin rash, itching or hives, swelling of the face, lips, or tongue  low blood counts - this medicine may decrease the number of white blood cells, red blood cells and platelets. You may be at increased risk for infections and bleeding.  signs of infection - fever or chills, cough, sore throat, pain or difficulty passing urine  signs of decreased platelets or bleeding - bruising, pinpoint red spots on the skin, black, tarry stools, blood in the urine  signs of decreased red blood cells - unusually weak or tired, fainting spells, lightheadedness  breathing problems  changes in vision  chest pain  confusion  dry cough  high blood pressure  mouth sores  pain, swelling, redness at site where injected  pain, tingling, numbness in the hands or feet  seizures  swelling of the ankles, feet, hands  trouble passing urine or change in the amount of urine Side effects that usually do not require medical attention (report to your doctor or health care professional if they continue or are bothersome):  diarrhea  green to blue  color of urine  hair loss  loss of appetite  nausea, vomiting This list may not describe all possible side effects. Call your doctor for medical advice about side effects. You may report side effects to FDA at 1-800-FDA-1088. Where should I keep my medicine? This drug is given in a hospital or clinic and will not be stored at home. NOTE: This sheet is a summary. It may not cover all possible information. If you have questions about this medicine, talk to your doctor, pharmacist, or health care provider.  2020 Elsevier/Gold Standard (2008-03-10 11:16:23)  Fluorouracil, 5-FU injection What is this medicine? FLUOROURACIL, 5-FU (flure oh YOOR a sil) is a chemotherapy drug. It slows the growth of cancer cells. This medicine is used to treat many types of cancer like breast cancer, colon or rectal cancer, pancreatic cancer, and stomach cancer. This medicine may be used for other purposes; ask your health care provider or pharmacist if you have questions. COMMON BRAND NAME(S): Adrucil What should I tell my health care provider before I take this medicine? They need to know if you have any of these conditions:  blood disorders  dihydropyrimidine dehydrogenase (DPD) deficiency  infection (especially a virus infection such as chickenpox, cold sores, or herpes)  kidney disease  liver disease  malnourished, poor nutrition  recent  or ongoing radiation therapy  an unusual or allergic reaction to fluorouracil, other chemotherapy, other medicines, foods, dyes, or preservatives  pregnant or trying to get pregnant  breast-feeding How should I use this medicine? This drug is given as an infusion or injection into a vein. It is administered in a hospital or clinic by a specially trained health care professional. Talk to your pediatrician regarding the use of this medicine in children. Special care may be needed. Overdosage: If you think you have taken too much of this medicine contact a  poison control center or emergency room at once. NOTE: This medicine is only for you. Do not share this medicine with others. What if I miss a dose? It is important not to miss your dose. Call your doctor or health care professional if you are unable to keep an appointment. What may interact with this medicine?  allopurinol  cimetidine  dapsone  digoxin  hydroxyurea  leucovorin  levamisole  medicines for seizures like ethotoin, fosphenytoin, phenytoin  medicines to increase blood counts like filgrastim, pegfilgrastim, sargramostim  medicines that treat or prevent blood clots like warfarin, enoxaparin, and dalteparin  methotrexate  metronidazole  pyrimethamine  some other chemotherapy drugs like busulfan, cisplatin, estramustine, vinblastine  trimethoprim  trimetrexate  vaccines Talk to your doctor or health care professional before taking any of these medicines:  acetaminophen  aspirin  ibuprofen  ketoprofen  naproxen This list may not describe all possible interactions. Give your health care provider a list of all the medicines, herbs, non-prescription drugs, or dietary supplements you use. Also tell them if you smoke, drink alcohol, or use illegal drugs. Some items may interact with your medicine. What should I watch for while using this medicine? Visit your doctor for checks on your progress. This drug may make you feel generally unwell. This is not uncommon, as chemotherapy can affect healthy cells as well as cancer cells. Report any side effects. Continue your course of treatment even though you feel ill unless your doctor tells you to stop. In some cases, you may be given additional medicines to help with side effects. Follow all directions for their use. Call your doctor or health care professional for advice if you get a fever, chills or sore throat, or other symptoms of a cold or flu. Do not treat yourself. This drug decreases your body's ability to  fight infections. Try to avoid being around people who are sick. This medicine may increase your risk to bruise or bleed. Call your doctor or health care professional if you notice any unusual bleeding. Be careful brushing and flossing your teeth or using a toothpick because you may get an infection or bleed more easily. If you have any dental work done, tell your dentist you are receiving this medicine. Avoid taking products that contain aspirin, acetaminophen, ibuprofen, naproxen, or ketoprofen unless instructed by your doctor. These medicines may hide a fever. Do not become pregnant while taking this medicine. Women should inform their doctor if they wish to become pregnant or think they might be pregnant. There is a potential for serious side effects to an unborn child. Talk to your health care professional or pharmacist for more information. Do not breast-feed an infant while taking this medicine. Men should inform their doctor if they wish to father a child. This medicine may lower sperm counts. Do not treat diarrhea with over the counter products. Contact your doctor if you have diarrhea that lasts more than 2 days or if it is  severe and watery. This medicine can make you more sensitive to the sun. Keep out of the sun. If you cannot avoid being in the sun, wear protective clothing and use sunscreen. Do not use sun lamps or tanning beds/booths. What side effects may I notice from receiving this medicine? Side effects that you should report to your doctor or health care professional as soon as possible:  allergic reactions like skin rash, itching or hives, swelling of the face, lips, or tongue  low blood counts - this medicine may decrease the number of white blood cells, red blood cells and platelets. You may be at increased risk for infections and bleeding.  signs of infection - fever or chills, cough, sore throat, pain or difficulty passing urine  signs of decreased platelets or bleeding -  bruising, pinpoint red spots on the skin, black, tarry stools, blood in the urine  signs of decreased red blood cells - unusually weak or tired, fainting spells, lightheadedness  breathing problems  changes in vision  chest pain  mouth sores  nausea and vomiting  pain, swelling, redness at site where injected  pain, tingling, numbness in the hands or feet  redness, swelling, or sores on hands or feet  stomach pain  unusual bleeding Side effects that usually do not require medical attention (report to your doctor or health care professional if they continue or are bothersome):  changes in finger or toe nails  diarrhea  dry or itchy skin  hair loss  headache  loss of appetite  sensitivity of eyes to the light  stomach upset  unusually teary eyes This list may not describe all possible side effects. Call your doctor for medical advice about side effects. You may report side effects to FDA at 1-800-FDA-1088. Where should I keep my medicine? This drug is given in a hospital or clinic and will not be stored at home. NOTE: This sheet is a summary. It may not cover all possible information. If you have questions about this medicine, talk to your doctor, pharmacist, or health care provider.  2020 Elsevier/Gold Standard (2008-01-06 13:53:16)

## 2019-06-28 NOTE — Discharge Instructions (Signed)
PICC Home Care Guide ° °A peripherally inserted central catheter (PICC) is a form of IV access that allows medicines and IV fluids to be quickly distributed throughout the body. The PICC is a long, thin, flexible tube (catheter) that is inserted into a vein in the upper arm. The catheter ends in a large vein in the chest (superior vena cava, or SVC). After the PICC is inserted, a chest X-ray may be done to make sure that it is in the correct place. °A PICC may be placed for different reasons, such as: °· To give medicines and liquid nutrition. °· To give IV fluids and blood products. °· If there is trouble placing a peripheral intravenous (PIV) catheter. °If taken care of properly, a PICC can remain in place for several months. Having a PICC can also allow a person to go home from the hospital sooner. Medicine and PICC care can be managed at home by a family member, caregiver, or home health care team. °What are the risks? °Generally, having a PICC is safe. However, problems may occur, including: °· A blood clot (thrombus) forming in or at the tip of the PICC. °· A blood clot forming in a vein (deep vein thrombosis) or traveling to the lung (pulmonary embolism). °· Inflammation of the vein (phlebitis) in which the PICC is placed. °· Infection. Central line associated blood stream infection (CLABSI) is a serious infection that often requires hospitalization. °· PICC movement (malposition). The PICC tip may move from its original position due to excessive physical activity, forceful coughing, sneezing, or vomiting. °· A break or cut in the PICC. It is important not to use scissors near the PICC. °· Nerve or tendon irritation or injury during PICC insertion. °How to take care of your PICC °Preventing problems °· You and any caregivers should wash your hands often with soap. Wash hands: °? Before touching the PICC line or the infusion device. °? Before changing a bandage (dressing). °· Flush the PICC as told by your  health care provider. Let your health care provider know right away if the PICC is hard to flush or does not flush. Do not use force to flush the PICC. °· Do not use a syringe that is less than 10 mL to flush the PICC. °· Avoid blood pressure checks on the arm in which the PICC is placed. °· Never pull or tug on the PICC. °· Do not take the PICC out yourself. Only a trained clinical professional should remove the PICC. °· Use clean and sterile supplies only. Keep the supplies in a dry place. Do not reuse needles, syringes, or any other supplies. Doing that can lead to infection. °· Keep pets and children away from your PICC line. °· Check the PICC insertion site every day for signs of infection. Check for: °? Leakage. °? Redness, swelling, or pain. °? Fluid or blood. °? Warmth. °? Pus or a bad smell. °PICC dressing care °· Keep your PICC bandage (dressing) clean and dry to prevent infection. °· Do not take baths, swim, or use a hot tub until your health care provider approves. Ask your health care provider if you can take showers. You may only be allowed to take sponge baths for bathing. When you are allowed to shower: °? Ask your health care provider to teach you how to wrap the PICC line. °? Cover the PICC line with clear plastic wrap and tape to keep it dry while showering. °· Follow instructions from your health care provider   about how to take care of your insertion site and dressing. Make sure you: °? Wash your hands with soap and water before you change your bandage (dressing). If soap and water are not available, use hand sanitizer. °? Change your dressing as told by your health care provider. °? Leave stitches (sutures), skin glue, or adhesive strips in place. These skin closures may need to stay in place for 2 weeks or longer. If adhesive strip edges start to loosen and curl up, you may trim the loose edges. Do not remove adhesive strips completely unless your health care provider tells you to do  that. °· Change your PICC dressing if it becomes loose or wet. °General instructions ° °· Carry your PICC identification card or wear a medical alert bracelet at all times. °· Keep the tube clamped at all times, unless it is being used. °· Carry a smooth-edge clamp with you at all times to place on the tube if it breaks. °· Do not use scissors or sharp objects near the tube. °· You may bend your arm and move it freely. If your PICC is near or at the bend of your elbow, avoid activity with repeated motion at the elbow. °· Avoid lifting heavy objects as told by your health care provider. °· Keep all follow-up visits as told by your health care provider. This is important. °Disposal of supplies °· Throw away any syringes in a disposal container that is meant for sharp items (sharps container). You can buy a sharps container from a pharmacy, or you can make one by using an empty hard plastic bottle with a cover. °· Place any used dressings or infusion bags into a plastic bag. Throw that bag in the trash. °Contact a health care provider if: °· You have pain in your arm, ear, face, or teeth. °· You have a fever or chills. °· You have redness, swelling, or pain around the insertion site. °· You have fluid or blood coming from the insertion site. °· Your insertion site feels warm to the touch. °· You have pus or a bad smell coming from the insertion site. °· Your skin feels hard and raised around the insertion site. °Get help right away if: °· Your PICC is accidentally pulled all the way out. If this happens, cover the insertion site with a bandage or gauze dressing. Do not throw the PICC away. Your health care provider will need to check it. °· Your PICC was tugged or pulled and has partially come out. Do not  push the PICC back in. °· You cannot flush the PICC, it is hard to flush, or the PICC leaks around the insertion site when it is flushed. °· You hear a "flushing" sound when the PICC is flushed. °· You feel your  heart racing or skipping beats. °· There is a hole or tear in the PICC. °· You have swelling in the arm in which the PICC was inserted. °· You have a red streak going up your arm from where the PICC was inserted. °Summary °· A peripherally inserted central catheter (PICC) is a long, thin, flexible tube (catheter) that is inserted into a vein in the upper arm. °· The PICC is inserted using a sterile technique by a specially trained nurse or physician. Only a trained clinical professional should remove it. °· Keep your PICC identification card with you at all times. °· Avoid blood pressure checks on the arm in which the PICC is placed. °· If cared for   properly, a PICC can remain in place for several months. Having a PICC can also allow a person to go home from the hospital sooner. °This information is not intended to replace advice given to you by your health care provider. Make sure you discuss any questions you have with your health care provider. °Document Released: 03/09/2003 Document Revised: 08/15/2017 Document Reviewed: 10/05/2016 °Elsevier Patient Education © 2020 [employer] ° °

## 2019-06-28 NOTE — Progress Notes (Signed)
[place]   Telephone:([phone]) [phone] [phone]) [phone]   Clinic Follow up Note   Patient Care Team: Kathyrn Lass, MD as PCP - General (Family Medicine) Wonda Horner, MD as Consulting Physician (Gastroenterology) Truitt Merle, MD as Consulting Physician (Hematology) Alla Feeling, NP as Nurse Practitioner (Nurse Practitioner) 06/28/2019  CHIEF COMPLAINT: F/u SCC at anorectal junction  SUMMARY OF ONCOLOGIC HISTORY: Oncology History Overview Note  Breast cancer of upper-outer quadrant of right female breast, (ER/PR positive, her2/Neu negative)   Primary site: Breast (Right)   Staging method: AJCC 7th Edition   Clinical: Stage IA (T1a, N0, cM0) signed by [name], MD on 09/07/2013 11:14 AM   Pathologic: Stage IA (T1a, N0, cM0) signed by [name], MD on 09/07/2013 11:14 AM   Summary: Stage IA (T1a, N0, cM0)  Breast cancer of upper-outer quadrant of left female breast (ER/PR/Her2Neu negative)   Primary site: Breast (Left)   Staging method: AJCC 7th Edition   Clinical: Stage IIA (T2, N0, cM0) signed by [name], MD on 09/07/2013 11:35 AM   Pathologic: Stage IIA (T2, N0, cM0) signed by [name], MD on 09/07/2013 11:35 AM   Summary: Stage IIA (T2, N0, cM0)     Breast cancer of upper-outer quadrant of left female breast ([place])  07/10/2007 Procedure   US biopsy showed invasive ductal carcinoma 0.8cm, Nottingham grade 3   07/30/2007 Surgery   She had left breast lumpectomy and LN biopsy which showed high grade invasive ductal cancer Nottingham grade 3, 2.7 cm, ER/PR/Her2 neu negative   11/24/2007 Surgery   Patient elected for bilateral mastectomy   09/07/2008 - 03/08/2009 Chemotherapy   dates are approximate: she received adriamycin and cytoxan followed by Taxol   03/08/2009 - 05/08/2009 Radiation Therapy   dates are approximate, she received XRT    Breast cancer of upper-outer quadrant of right female breast ([place])  07/10/2006 Procedure   stereotactic biopsy  showed atypical hyperplasia   10/23/2006 Surgery   right lumpectomy showed invasive ductal ca (Nottingham grade 1)  9m and DCIS, ER/PR positive Her 2 negative   10/09/2007 - 10/08/2012 Chemotherapy   Patient was placed on Tamoxifen   03/24/2017 Imaging   No acute findings. No evidence of recurrent carcinoma or metastatic disease   Anal cancer [place]  06/04/2019 Procedure   Colonoscopy per Dr. SAnson Fret Findings-the digital rectal exam revealed a 3 cm diameter firm rectal mass.  The mass was noncircumferential and located predominantly at the right bowel wall at the anorectal junction. A nonobstructing mass was found at the anus and in the rectum    06/04/2019 Initial Biopsy   Follow pathology: Large intestine, rectum biopsy: Invasive well to moderately differentiated squamous cell carcinoma.  No rectal mucosa present.  There is strong diffuse expression of P 16 immunostain.  CDX 2, p63 and mCEA immunostains are also used in the diagnostic work-up of the case.   06/04/2019 Initial Diagnosis   Anal cancer ([place]   06/21/2019 PET scan   IMPRESSION: 1. Anorectal primary. No hypermetabolic metastatic disease within the chest, abdomen, or pelvis. Perirectal nodes, at least 1 of which is new since 02/26/2018 CT, suspicious based on size and interval development. 2. Mild limitations secondary to beam hardening artifact from bilateral hip arthroplasty. 3.  Aortic Atherosclerosis (ICD10-I70.0).   06/28/2019 -  Chemotherapy   The patient had mitoMYcin (MUTAMYCIN) chemo injection 22 mg, 10 mg/m2 = 22 mg, Intravenous,  Once, 1 of 1 cycle Administration: 22 mg (06/28/2019) fluorouracil (  ADRUCIL) 8,900 mg in sodium chloride 0.9 % 72 mL chemo infusion, 1,000 mg/m2/day = 8,900 mg, Intravenous, 4D (96 hours ), 1 of 1 cycle Administration: 8,900 mg (06/28/2019)  for chemotherapy treatment.    06/28/2019 Cancer Staging   Staging form: Anus, AJCC 8th Edition - Clinical stage from 06/28/2019: Stage IIA  (cT2, cN0, cM0) - Signed by Alla Feeling, NP on 06/28/2019     CURRENT THERAPY: Definitive treatment with concurrent chemoRT Mitomycin and 5FU starting 06/28/19  INTERVAL HISTORY: Cynthia Velasquez returns for f/u and treatment as scheduled. She has occasional severe rectal pain. Rates pain 6/10 today. She is standing up in today's visit. She spent more time in the car yesterday which is why her pain is increased. Dilaudid is helpful, takes it once per day. Not sleeping much do to pain. She continues linzess and miralax for constipation. Stools are small calibre. She has blood clots with BM that vary in size. Denies n/v. She developed difficulty starting stream and mild dysuria over the last week. She may sit on toilet for 30 minutes before she is able to void. No obvious hematuria but she does have blood with BM so it's hard to tell. Denies fever, chills, cough, chest pain, dyspnea, or leg swelling. She does have intermittent cramps. Po intake is "decent."    MEDICAL HISTORY:  Past Medical History:  Diagnosis Date   Anxiety    Arthritis    Bilateral breast cancer ([place]) 10/21/2013   Breast cancer ([place])    Cancer ([place])    Depression    Gallstones    GERD (gastroesophageal reflux disease)    doesn't take any meds for this   Headache    h/o migraines       History of bladder infections    History of hiatal hernia    History of migraine    Hyperlipidemia    takes Simvastatin daily   Hypertension    takes Hyzaar   Joint pain    Joint swelling    Lumbar stenosis    Neuropathy 09/07/2013   Pneumonia    Pre-diabetes     SURGICAL HISTORY: Past Surgical History:  Procedure Laterality Date   ABDOMINAL HYSTERECTOMY     bone spur removed from left foot     CHOLECYSTECTOMY     COLONOSCOPY     double mastectomy      HERNIA REPAIR     umbilical   right knee arthroscopy     right shoulder arthroscopy     surgery for hiatal hernia     TONSILLECTOMY      TOTAL HIP ARTHROPLASTY Left 02/12/2013   TOTAL HIP ARTHROPLASTY Left 02/12/2013   Procedure: LEFT TOTAL HIP ARTHROPLASTY;  Surgeon: Kerin Salen, MD;  Location: [facility];  Service: Orthopedics;  Laterality: Left;   TOTAL HIP ARTHROPLASTY Right 05/03/2013   Procedure: TOTAL HIP ARTHROPLASTY;  Surgeon: Kerin Salen, MD;  Location: [facility];  Service: Orthopedics;  Laterality: Right;   TOTAL KNEE ARTHROPLASTY Right 02/27/2015   Procedure: TOTAL KNEE ARTHROPLASTY;  Surgeon: Frederik Pear, MD;  Location: [facility];  Service: Orthopedics;  Laterality: Right;   TOTAL SHOULDER ARTHROPLASTY Right 09/12/2016   Procedure: RIGHT TOTAL SHOULDER ARTHROPLASTY;  Surgeon: Tania Ade, MD;  Location: [facility];  Service: Orthopedics;  Laterality: Right;  RIGHT TOTAL SHOULDER ARTHROPLASTY    I have reviewed the social history and family history with the patient and they are unchanged from previous note.  ALLERGIES:  is allergic to  oxycodone-acetaminophen; codeine; and vicodin [hydrocodone-acetaminophen].  MEDICATIONS:  Current Outpatient Medications  Medication Sig Dispense Refill   HYDROmorphone (DILAUDID) 2 MG tablet Take 0.5-1 tablets (1-2 mg total) by mouth every 4 (four) hours as needed for severe pain. 60 tablet 0   LINZESS 72 MCG capsule      losartan (COZAAR) 100 MG tablet TK 1 T PO D  6   nitroGLYCERIN (NITROSTAT) 0.4 MG SL tablet Place 0.4 mg under the tongue every 5 (five) minutes x 3 doses as needed for chest pain.  0   ondansetron (ZOFRAN ODT) 4 MG disintegrating tablet Take 1 tablet (4 mg total) by mouth every 8 (eight) hours as needed for nausea or vomiting. 6 tablet 0   PROAIR HFA 108 (90 Base) MCG/ACT inhaler 2 inhalations every 4-6 hours as needed. (Patient taking differently: Inhale 2 puffs into the lungs every 6 (six) hours as needed for wheezing or shortness of breath. ) 18 g 1   promethazine (PHENERGAN) 25 MG tablet TK 1 T PO QID FOR 3 DAYS PRF VOM     No current facility-administered  medications for this visit.    Facility-Administered Medications Ordered in Other Visits  Medication Dose Route Frequency Provider Last Rate Last Dose   fluorouracil (ADRUCIL) 8,900 mg in sodium chloride 0.9 % 72 mL chemo infusion  1,000 mg/m2/day (Treatment Plan Recorded) Intravenous 4 days Truitt Merle, MD   8,900 mg at 06/28/19 1353   heparin lock flush 100 unit/mL  250 Units Intracatheter Once PRN Truitt Merle, MD       lidocaine (XYLOCAINE) 1 % (with pres) injection            lidocaine (XYLOCAINE) 1 % (with pres) injection    PRN Sandi Mariscal, MD   5 mL at 06/28/19 0937   sodium chloride flush (NS) 0.9 % injection 10 mL  10 mL Intracatheter PRN Truitt Merle, MD        PHYSICAL EXAMINATION: ECOG PERFORMANCE STATUS: 1 - Symptomatic but completely ambulatory  Vitals:   06/28/19 1125  BP: (!) 151/90  Pulse: 63  Resp: 17  Temp: 98.3 F (36.8 C)  SpO2: 100%   Filed Weights   06/28/19 1125  Weight: 233 lb 9.6 oz (106 kg)    GENERAL:alert, no distress and comfortable SKIN: no rash  EYES: sclera clear LUNGS: clear with normal breathing effort HEART: regular rate & rhythm, no lower extremity edema ABDOMEN:abdomen soft, non-tender and normal bowel sounds Musculoskeletal:no cyanosis of digits  NEURO: alert & oriented x 3 with fluent speech, normal gait PICC without erythema   LABORATORY DATA:  I have reviewed the data as listed CBC Latest Ref Rng & Units 06/28/2019 06/14/2019 05/22/2018  WBC 4.0 - 10.5 K/uL 6.7 6.3 -  Hemoglobin 12.0 - 15.0 g/dL 13.4 14.0 13.6  Hematocrit 36.0 - 46.0 % 42.0 44.6 40.0  Platelets 150 - 400 K/uL 262 255 -     CMP Latest Ref Rng & Units 06/28/2019 06/14/2019 05/22/2018  Glucose 70 - 99 mg/dL 97 105(H) 102(H)  BUN 8 - 23 mg/dL '13 13 16  ' Creatinine 0.44 - 1.00 mg/dL 0.92 0.91 0.80  Sodium 135 - 145 mmol/L 141 141 142  Potassium 3.5 - 5.1 mmol/L 3.6 3.9 3.7  Chloride 98 - 111 mmol/L 107 109 107  CO2 22 - 32 mmol/L 26 26 -  Calcium 8.9 - 10.3 mg/dL 9.2  9.0 -  Total Protein 6.5 - 8.1 g/dL 6.7 6.8 -  Total Bilirubin 0.3 -  1.2 mg/dL 0.4 0.4 -  Alkaline Phos 38 - 126 U/L 98 106 -  AST 15 - 41 U/L 15 14(L) -  ALT 0 - 44 U/L 9 9 -      RADIOGRAPHIC STUDIES: I have personally reviewed the radiological images as listed and agreed with the findings in the report. Ir Picc Placement Right >5 Yrs Inc Img Guide  Result Date: 06/28/2019 INDICATION: History of anal cancer. In need of durable intravenous access for the initiation of chemotherapy. EXAM: ULTRASOUND AND FLUOROSCOPIC GUIDED PICC LINE INSERTION MEDICATIONS: None. CONTRAST:  None FLUOROSCOPY TIME:  12 seconds (2.8 mGy) COMPLICATIONS: None immediate. TECHNIQUE: The procedure, risks, benefits, and alternatives were explained to the patient and informed written consent was obtained. A timeout was performed prior to the initiation of the procedure. The right upper extremity was prepped with chlorhexidine in a sterile fashion, and a sterile drape was applied covering the operative field. Maximum barrier sterile technique with sterile gowns and gloves were used for the procedure. A timeout was performed prior to the initiation of the procedure. Local anesthesia was provided with 1% lidocaine. Under direct ultrasound guidance, the basilic vein was accessed with a micropuncture kit after the overlying soft tissues were anesthetized with 1% lidocaine. After the overlying soft tissues were anesthetized, a small venotomy incision was created and a micropuncture kit was utilized to access the right basilic vein. Real-time ultrasound guidance was utilized for vascular access including the acquisition of a permanent ultrasound image documenting patency of the accessed vessel. A guidewire was advanced to the level of the superior caval-atrial junction for measurement purposes and the PICC line was cut to length. A peel-away sheath was placed and a 41 cm, 5 [place], single lumen was inserted to level of the superior  caval-atrial junction. A post procedure spot fluoroscopic was obtained. The catheter easily aspirated and flushed and was sutured in place. A dressing was placed. The patient tolerated the procedure well without immediate post procedural complication. FINDINGS: After catheter placement, the tip lies within the superior cavoatrial junction. The catheter aspirates and flushes normally and is ready for immediate use. IMPRESSION: Successful ultrasound and fluoroscopic guided placement of a right basilic vein approach, 41 cm, 5 French, single lumen PICC with tip at the superior caval-atrial junction. The PICC line is ready for immediate use. Electronically Signed   By: Sandi Mariscal M.D.   On: 06/28/2019 10:28     ASSESSMENT & PLAN: 68 yo female with   1. Anal Squamous Cell Carcinoma, [id number] -diagnosed 05/2019 She presented with rectal bleeding and anal pressure. Biopsy showed a 3 cm mass at the anorectal junction, path confirmed well to moderately differentiated squamous cell carcinoma.  -I previously called her and reviewed her PET scan shows no metastasis -She met with [name] and will begin RT today  -[name] recommended a standard chemotherapy with 5-FU and mitomycin on weeks 1 and 5 regimen with concurrent radiation. She consented and attended chemo education class -Treatment goal is curative  -Ms. Gerilyn Nestle appears stable. She underwent PICC line this morning. She has worsening rectal pain and small calibre stool with bleeding -pain is controlled on Dilaudid, she takes once daily -I encouraged her to continue miralax to keep stool soft, will monitor closely. If she develops frequent or liquid stool on chemoRT she will need to reduce or hold doses. She understands  -She developed issues starting urine stream with mild dysuria. UA is not overly convincing for UTI, will await culture. Will  f/u.  -Labs reviewed, adequate to proceed with cycle 1 day 1 chemo today -She will follow up weekly with lab on  treatment   2. H/o bilateral breast cancer, followed by Dr. [name]  -2007: right breast biopsy showed atypical hyperplasia, s/p right lumpectomy in 06/2006 ER/PR+/HER2-, grade 1. Completed tamoxifen from 09/2007 - 09/2012 -2008: left breast invasive ductal carcinoma, triple negative, grade 3 s/p lumpectomy in 07/2007 and eventual bilateral mastectomy; s/p adjuvant chemo AC-T and radiation   3. Genetics -patient is adopted, no known family history  -she reportedly had genetic testing which was negative. Her daughter also negative. I do not have the report.   4. HTN -on losartan -BP 166/92 today -will monitor on chemo, she may require dose adjustments and or discontinuation of BP meds during therapy if she experiences hypotension   PLAN: -Labs reviewed -Proceed with cycle 1 day 1 mitomycin and 5FU -Begin RT today -UA/culture today -Reviewed potential side effects of treatment, symptom management -Lab and f/u weekly on treatment    Orders Placed This Encounter  Procedures   Urine Culture    Standing Status:   Future    Number of Occurrences:   1    Standing Expiration Date:   06/27/2020   Urinalysis, Complete w Microscopic    Standing Status:   Future    Number of Occurrences:   1    Standing Expiration Date:   06/27/2020   All questions were answered. The patient knows to call the clinic with any problems, questions or concerns. No barriers to learning was detected. I spent 20 minutes counseling the patient face to face. The total time spent in the appointment was 25 minutes and more than 50% was on counseling and review of test results     Alla Feeling, NP 06/28/19

## 2019-06-28 NOTE — Telephone Encounter (Signed)
Called patient regarding 10/16 appointments, left a voicemail.

## 2019-06-28 NOTE — Patient Instructions (Signed)
PICC Home Care Guide ° °A peripherally inserted central catheter (PICC) is a form of IV access that allows medicines and IV fluids to be quickly distributed throughout the body. The PICC is a long, thin, flexible tube (catheter) that is inserted into a vein in the upper arm. The catheter ends in a large vein in the chest (superior vena cava, or SVC). After the PICC is inserted, a chest X-ray may be done to make sure that it is in the correct place. °A PICC may be placed for different reasons, such as: °· To give medicines and liquid nutrition. °· To give IV fluids and blood products. °· If there is trouble placing a peripheral intravenous (PIV) catheter. °If taken care of properly, a PICC can remain in place for several months. Having a PICC can also allow a person to go home from the hospital sooner. Medicine and PICC care can be managed at home by a family member, caregiver, or home health care team. °What are the risks? °Generally, having a PICC is safe. However, problems may occur, including: °· A blood clot (thrombus) forming in or at the tip of the PICC. °· A blood clot forming in a vein (deep vein thrombosis) or traveling to the lung (pulmonary embolism). °· Inflammation of the vein (phlebitis) in which the PICC is placed. °· Infection. Central line associated blood stream infection (CLABSI) is a serious infection that often requires hospitalization. °· PICC movement (malposition). The PICC tip may move from its original position due to excessive physical activity, forceful coughing, sneezing, or vomiting. °· A break or cut in the PICC. It is important not to use scissors near the PICC. °· Nerve or tendon irritation or injury during PICC insertion. °How to take care of your PICC °Preventing problems °· You and any caregivers should wash your hands often with soap. Wash hands: °? Before touching the PICC line or the infusion device. °? Before changing a bandage (dressing). °· Flush the PICC as told by your  health care provider. Let your health care provider know right away if the PICC is hard to flush or does not flush. Do not use force to flush the PICC. °· Do not use a syringe that is less than 10 mL to flush the PICC. °· Avoid blood pressure checks on the arm in which the PICC is placed. °· Never pull or tug on the PICC. °· Do not take the PICC out yourself. Only a trained clinical professional should remove the PICC. °· Use clean and sterile supplies only. Keep the supplies in a dry place. Do not reuse needles, syringes, or any other supplies. Doing that can lead to infection. °· Keep pets and children away from your PICC line. °· Check the PICC insertion site every day for signs of infection. Check for: °? Leakage. °? Redness, swelling, or pain. °? Fluid or blood. °? Warmth. °? Pus or a bad smell. °PICC dressing care °· Keep your PICC bandage (dressing) clean and dry to prevent infection. °· Do not take baths, swim, or use a hot tub until your health care provider approves. Ask your health care provider if you can take showers. You may only be allowed to take sponge baths for bathing. When you are allowed to shower: °? Ask your health care provider to teach you how to wrap the PICC line. °? Cover the PICC line with clear plastic wrap and tape to keep it dry while showering. °· Follow instructions from your health care provider   about how to take care of your insertion site and dressing. Make sure you: °? Wash your hands with soap and water before you change your bandage (dressing). If soap and water are not available, use hand sanitizer. °? Change your dressing as told by your health care provider. °? Leave stitches (sutures), skin glue, or adhesive strips in place. These skin closures may need to stay in place for 2 weeks or longer. If adhesive strip edges start to loosen and curl up, you may trim the loose edges. Do not remove adhesive strips completely unless your health care provider tells you to do  that. °· Change your PICC dressing if it becomes loose or wet. °General instructions ° °· Carry your PICC identification card or wear a medical alert bracelet at all times. °· Keep the tube clamped at all times, unless it is being used. °· Carry a smooth-edge clamp with you at all times to place on the tube if it breaks. °· Do not use scissors or sharp objects near the tube. °· You may bend your arm and move it freely. If your PICC is near or at the bend of your elbow, avoid activity with repeated motion at the elbow. °· Avoid lifting heavy objects as told by your health care provider. °· Keep all follow-up visits as told by your health care provider. This is important. °Disposal of supplies °· Throw away any syringes in a disposal container that is meant for sharp items (sharps container). You can buy a sharps container from a pharmacy, or you can make one by using an empty hard plastic bottle with a cover. °· Place any used dressings or infusion bags into a plastic bag. Throw that bag in the trash. °Contact a health care provider if: °· You have pain in your arm, ear, face, or teeth. °· You have a fever or chills. °· You have redness, swelling, or pain around the insertion site. °· You have fluid or blood coming from the insertion site. °· Your insertion site feels warm to the touch. °· You have pus or a bad smell coming from the insertion site. °· Your skin feels hard and raised around the insertion site. °Get help right away if: °· Your PICC is accidentally pulled all the way out. If this happens, cover the insertion site with a bandage or gauze dressing. Do not throw the PICC away. Your health care provider will need to check it. °· Your PICC was tugged or pulled and has partially come out. Do not  push the PICC back in. °· You cannot flush the PICC, it is hard to flush, or the PICC leaks around the insertion site when it is flushed. °· You hear a "flushing" sound when the PICC is flushed. °· You feel your  heart racing or skipping beats. °· There is a hole or tear in the PICC. °· You have swelling in the arm in which the PICC was inserted. °· You have a red streak going up your arm from where the PICC was inserted. °Summary °· A peripherally inserted central catheter (PICC) is a long, thin, flexible tube (catheter) that is inserted into a vein in the upper arm. °· The PICC is inserted using a sterile technique by a specially trained nurse or physician. Only a trained clinical professional should remove it. °· Keep your PICC identification card with you at all times. °· Avoid blood pressure checks on the arm in which the PICC is placed. °· If cared for   properly, a PICC can remain in place for several months. Having a PICC can also allow a person to go home from the hospital sooner. °This information is not intended to replace advice given to you by your health care provider. Make sure you discuss any questions you have with your health care provider. °Document Released: 03/09/2003 Document Revised: 08/15/2017 Document Reviewed: 10/05/2016 °Elsevier Patient Education © 2020 [employer] ° °

## 2019-06-28 NOTE — Progress Notes (Signed)
Pt here for patient teaching.  Pt given Radiation and You booklet.  Reviewed areas of pertinence such as diarrhea, fatigue, hair loss, nausea and vomiting, sexual and fertility changes, skin changes and urinary and bladder changes . Pt able to give teach back of to pat skin, use unscented/gentle soap, use baby wipes, have Imodium on hand, drink plenty of water and sitz bath,avoid applying anything to skin within 4 hours of treatment. Pt verbalizes understanding of information given and will contact nursing with any questions or concerns.     Judithann Villamar M. Brennen Gardiner RN, BSN      

## 2019-06-29 ENCOUNTER — Ambulatory Visit
Admission: RE | Admit: 2019-06-29 | Discharge: 2019-06-29 | Disposition: A | Discharge status: Home / Self Care | Payer: PPO | Source: Ambulatory Visit | Attending: Radiation Oncology | Admitting: Radiation Oncology

## 2019-06-29 ENCOUNTER — Telehealth: Payer: Self-pay | Admitting: *Deleted

## 2019-06-29 ENCOUNTER — Telehealth: Payer: Self-pay | Admitting: Nurse Practitioner

## 2019-06-29 ENCOUNTER — Other Ambulatory Visit: Payer: Self-pay

## 2019-06-29 DIAGNOSIS — Z510 Encounter for antineoplastic radiation therapy: Secondary | ICD-10-CM | POA: Diagnosis not present

## 2019-06-29 DIAGNOSIS — C211 Malignant neoplasm of anal canal: Secondary | ICD-10-CM | POA: Diagnosis not present

## 2019-06-29 LAB — URINE CULTURE: Culture: NO GROWTH

## 2019-06-29 NOTE — Telephone Encounter (Signed)
Left message for pt to return call regarding Lacie Burton's message.

## 2019-06-29 NOTE — Telephone Encounter (Signed)
-----   Message from Alla Feeling, NP sent at 06/29/2019  3:19 PM EDT ----- Please let her know urine is negative for UTI. Encourage her to hydrate well.  Thanks, Regan Rakers

## 2019-06-29 NOTE — Telephone Encounter (Signed)
No los per 10/12. °

## 2019-06-30 ENCOUNTER — Telehealth: Payer: Self-pay

## 2019-06-30 ENCOUNTER — Other Ambulatory Visit: Payer: Self-pay

## 2019-06-30 ENCOUNTER — Ambulatory Visit
Admission: RE | Admit: 2019-06-30 | Discharge: 2019-06-30 | Disposition: A | Discharge status: Home / Self Care | Payer: PPO | Source: Ambulatory Visit | Attending: Radiation Oncology | Admitting: Radiation Oncology

## 2019-06-30 DIAGNOSIS — C211 Malignant neoplasm of anal canal: Secondary | ICD-10-CM | POA: Diagnosis not present

## 2019-06-30 DIAGNOSIS — Z510 Encounter for antineoplastic radiation therapy: Secondary | ICD-10-CM | POA: Diagnosis not present

## 2019-06-30 NOTE — Telephone Encounter (Signed)
TC to pt per Cira Rue NP to let her know her urine is negative for UTI. Encouraged her to hydrate well.

## 2019-06-30 NOTE — Progress Notes (Signed)
[place]   [phone] [phone]   Clinic Follow up Note   Patient Care Team: Kathyrn Lass, MD as PCP - General (Family Medicine) Wonda Horner, MD as Consulting Physician (Gastroenterology) Truitt Merle, MD as Consulting Physician (Hematology) Alla Feeling, NP as Nurse Practitioner (Nurse Practitioner)  Date of Service:  07/05/2019  CHIEF COMPLAINT: f/u of rectal cancer  SUMMARY OF ONCOLOGIC HISTORY: Oncology History Overview Note  Breast cancer of upper-outer quadrant of right female breast, (ER/PR positive, her2/Neu negative)   Primary site: Breast (Right)   Staging method: AJCC 7th Edition   Clinical: Stage IA (T1a, N0, cM0) signed by [name], MD on 09/07/2013 11:14 AM   Pathologic: Stage IA (T1a, N0, cM0) signed by [name], MD on 09/07/2013 11:14 AM   Summary: Stage IA (T1a, N0, cM0)  Breast cancer of upper-outer quadrant of left female breast (ER/PR/Her2Neu negative)   Primary site: Breast (Left)   Staging method: AJCC 7th Edition   Clinical: Stage IIA (T2, N0, cM0) signed by [name], MD on 09/07/2013 11:35 AM   Pathologic: Stage IIA (T2, N0, cM0) signed by [name], MD on 09/07/2013 11:35 AM   Summary: Stage IIA (T2, N0, cM0)     Breast cancer of upper-outer quadrant of left female breast ([place])  07/10/2007 Procedure   US biopsy showed invasive ductal carcinoma 0.8cm, Nottingham grade 3   07/30/2007 Surgery   She had left breast lumpectomy and LN biopsy which showed high grade invasive ductal cancer Nottingham grade 3, 2.7 cm, ER/PR/Her2 neu negative   11/24/2007 Surgery   Patient elected for bilateral mastectomy   09/07/2008 - 03/08/2009 Chemotherapy   dates are approximate: she received adriamycin and cytoxan followed by Taxol   03/08/2009 - 05/08/2009 Radiation Therapy   dates are approximate, she received XRT    Breast cancer of upper-outer quadrant of right female breast ([place])  07/10/2006 Procedure   stereotactic biopsy showed atypical hyperplasia   10/23/2006 Surgery   right lumpectomy showed invasive ductal ca (Nottingham grade 1)  25m and DCIS, ER/PR positive Her 2 negative   10/09/2007 - 10/08/2012 Chemotherapy   Patient was placed on Tamoxifen   03/24/2017 Imaging   No acute findings. No evidence of recurrent carcinoma or metastatic disease   Anal cancer ([facility]  06/04/2019 Procedure   Colonoscopy per Dr. SAnson Fret Findings-the digital rectal exam revealed a 3 cm diameter firm rectal mass.  The mass was noncircumferential and located predominantly at the right bowel wall at the anorectal junction. A nonobstructing mass was found at the anus and in the rectum    06/04/2019 Initial Biopsy   Follow pathology: Large intestine, rectum biopsy: Invasive well to moderately differentiated squamous cell carcinoma.  No rectal mucosa present.  There is strong diffuse expression of P 16 immunostain.  CDX 2, p63 and mCEA immunostains are also used in the diagnostic work-up of the case.   06/04/2019 Initial Diagnosis   Anal cancer ([facility]   06/21/2019 PET scan   IMPRESSION: 1. Anorectal primary. No hypermetabolic metastatic disease within the chest, abdomen, or pelvis. Perirectal nodes, at least 1 of which is new since 02/26/2018 CT, suspicious based on size and interval development. 2. Mild limitations secondary to beam hardening artifact from bilateral hip arthroplasty. 3.  Aortic Atherosclerosis (ICD10-I70.0).   06/28/2019 Cancer Staging   Staging form: Anus, AJCC 8th Edition - Clinical stage from 06/28/2019: Stage IIA (cT2, cN0, cM0) - Signed by BAlla Feeling NP on  06/28/2019   06/28/2019 -  Chemotherapy   concurrent chemoRT with Mitomycin and 5FU on week 1 and week 5 on starting 06/28/19.    06/28/2019 -  Radiation Therapy   concurrent chemoRT with Dr [name] starting on 06/28/19.       CURRENT THERAPY:  concurrent chemoRT with Mitomycin and 5FU on week 1 and week 5 on 06/28/19.    INTERVAL HISTORY:  Cynthia Velasquez is here for a follow up rectal cancer. She presents to the clinic alone. She notes her first week of chemo she experienced her blood sugar dropped twice and she felt flushed, nausea and dizziness. It came quickly and went away 30 minutes later. She did not take antiemetics. She notes having mouth soreness this week. She has been taking lemon drops and mouthwash. She denies seeing mouth sores currently. She notes she eats but nothing taste well. She notes after constipation she had diarrhea 2 days ago. She had no pain when she went. She notes her over rectal pain and her rectal bleeding has improved since radiations. She notes she rarely takes the Dilaudid because it will keep her up at night. She can not tolerate Vicodin, oxycodone and tramadol which causes itching.     REVIEW OF SYSTEMS:  Constitutional: Denies fevers, chills or abnormal weight loss (+) taste change Eyes: Denies blurriness of vision Ears, nose, mouth, throat, and face: Denies mucositis or sore throat (+) Mouth soreness Respiratory: Denies cough, dyspnea or wheezes Cardiovascular: Denies palpitation, chest discomfort or lower extremity swelling Gastrointestinal:  Denies nausea, heartburn or change in bowel habits Skin: Denies abnormal skin rashes Lymphatics: Denies new lymphadenopathy or easy bruising Neurological:Denies numbness, tingling or new weaknesses Behavioral/Psych: Mood is stable, no new changes  All other systems were reviewed with the patient and are negative.  MEDICAL HISTORY:  Past Medical History:  Diagnosis Date  . Anxiety   . Arthritis   . Bilateral breast cancer ([place]) 10/21/2013  . Breast cancer ([place])   . Cancer ([place])   . Depression   . Gallstones   . GERD (gastroesophageal reflux disease)    doesn't take any meds for this  . Headache    h/o migraines      . History of bladder infections   . History of hiatal hernia   . History of migraine   . Hyperlipidemia     takes Simvastatin daily  . Hypertension    takes Hyzaar  . Joint pain   . Joint swelling   . Lumbar stenosis   . Neuropathy 09/07/2013  . Pneumonia   . Pre-diabetes     SURGICAL HISTORY: Past Surgical History:  Procedure Laterality Date  . ABDOMINAL HYSTERECTOMY    . bone spur removed from left foot    . CHOLECYSTECTOMY    . COLONOSCOPY    . double mastectomy     . HERNIA REPAIR     umbilical  . right knee arthroscopy    . right shoulder arthroscopy    . surgery for hiatal hernia    . TONSILLECTOMY    . TOTAL HIP ARTHROPLASTY Left 02/12/2013  . TOTAL HIP ARTHROPLASTY Left 02/12/2013   Procedure: LEFT TOTAL HIP ARTHROPLASTY;  Surgeon: Kerin Salen, MD;  Location: [facility];  Service: Orthopedics;  Laterality: Left;  . TOTAL HIP ARTHROPLASTY Right 05/03/2013   Procedure: TOTAL HIP ARTHROPLASTY;  Surgeon: Kerin Salen, MD;  Location: [facility];  Service: Orthopedics;  Laterality: Right;  . TOTAL KNEE ARTHROPLASTY Right 02/27/2015  Procedure: TOTAL KNEE ARTHROPLASTY;  Surgeon: Frederik Pear, MD;  Location: [facility];  Service: Orthopedics;  Laterality: Right;  . TOTAL SHOULDER ARTHROPLASTY Right 09/12/2016   Procedure: RIGHT TOTAL SHOULDER ARTHROPLASTY;  Surgeon: Tania Ade, MD;  Location: [facility];  Service: Orthopedics;  Laterality: Right;  RIGHT TOTAL SHOULDER ARTHROPLASTY    I have reviewed the social history and family history with the patient and they are unchanged from previous note.  ALLERGIES:  is allergic to oxycodone-acetaminophen; codeine; and vicodin [hydrocodone-acetaminophen].  MEDICATIONS:  Current Outpatient Medications  Medication Sig Dispense Refill  . HYDROmorphone (DILAUDID) 2 MG tablet Take 0.5-1 tablets (1-2 mg total) by mouth every 4 (four) hours as needed for severe pain. 60 tablet 0  . LINZESS 72 MCG capsule     . losartan (COZAAR) 100 MG tablet TK 1 T PO D  6  . nitroGLYCERIN (NITROSTAT) 0.4 MG SL tablet Place 0.4 mg under the tongue every 5 (five) minutes x 3  doses as needed for chest pain.  0  . ondansetron (ZOFRAN ODT) 4 MG disintegrating tablet Take 1 tablet (4 mg total) by mouth every 8 (eight) hours as needed for nausea or vomiting. 6 tablet 0  . PROAIR HFA 108 (90 Base) MCG/ACT inhaler 2 inhalations every 4-6 hours as needed. (Patient taking differently: Inhale 2 puffs into the lungs every 6 (six) hours as needed for wheezing or shortness of breath. ) 18 g 1  . promethazine (PHENERGAN) 25 MG tablet TK 1 T PO QID FOR 3 DAYS PRF VOM     No current facility-administered medications for this visit.     PHYSICAL EXAMINATION: ECOG PERFORMANCE STATUS: 1 - Symptomatic but completely ambulatory  Vitals:   07/05/19 1450  BP: 126/90  Pulse: 79  Resp: 18  Temp: 98.7 F (37.1 C)  SpO2: 98%   Filed Weights   07/05/19 1450  Weight: 234 lb (106.1 kg)    GENERAL:alert, no distress and comfortable SKIN: skin color, texture, turgor are normal, no rashes or significant lesions EYES: normal, Conjunctiva are pink and non-injected, sclera clear  NECK: supple, thyroid normal size, non-tender, without nodularity LYMPH:  no palpable lymphadenopathy in the cervical, axillary  LUNGS: clear to auscultation and percussion with normal breathing effort HEART: regular rate & rhythm and no murmurs and no lower extremity edema ABDOMEN:abdomen soft, non-tender and normal bowel sounds Musculoskeletal:no cyanosis of digits and no clubbing  NEURO: alert & oriented x 3 with fluent speech, no focal motor/sensory deficits  LABORATORY DATA:  I have reviewed the data as listed CBC Latest Ref Rng & Units 07/05/2019 06/28/2019 06/14/2019  WBC 4.0 - 10.5 K/uL 2.4(L) 6.7 6.3  Hemoglobin 12.0 - 15.0 g/dL 13.0 13.4 14.0  Hematocrit 36.0 - 46.0 % 41.4 42.0 44.6  Platelets 150 - 400 K/uL 181 262 255     CMP Latest Ref Rng & Units 06/28/2019 06/14/2019 05/22/2018  Glucose 70 - 99 mg/dL 97 105(H) 102(H)  BUN 8 - 23 mg/dL '13 13 16  ' Creatinine 0.44 - 1.00 mg/dL 0.92 0.91 0.80   Sodium 135 - 145 mmol/L 141 141 142  Potassium 3.5 - 5.1 mmol/L 3.6 3.9 3.7  Chloride 98 - 111 mmol/L 107 109 107  CO2 22 - 32 mmol/L 26 26 -  Calcium 8.9 - 10.3 mg/dL 9.2 9.0 -  Total Protein 6.5 - 8.1 g/dL 6.7 6.8 -  Total Bilirubin 0.3 - 1.2 mg/dL 0.4 0.4 -  Alkaline Phos 38 - 126 U/L 98 106 -  AST 15 - 41 U/L 15 14(L) -  ALT 0 - 44 U/L 9 9 -      RADIOGRAPHIC STUDIES: I have personally reviewed the radiological images as listed and agreed with the findings in the report. No results found.   ASSESSMENT & PLAN:  Reva Pinkley is a 68 y.o. female with   1. Anal Squamous Cell Carcinoma, TxNxM0 -She was recently diagnosed in 05/2019. She presented with rectal bleeding and anal pressure. Biopsy showed a 3 cm mass at the anorectal junction, path confirmed well to moderately differentiated squamous cell carcinoma. 06/21/19 PET scan shows no metastasis.  -She is a candidate for definitive chemotherapy and radiation, which will likely cure the cancer with success rate of 80-90%. Surgery is usually reserved for residual disease or local recurrence after concurrent chemoradiation.  -She started concurrent chemoRT with Mitomycin and 5FU on week 1 and week 5 on 06/28/19.  -She tolerating first week of ChemoRT moderately well with 2 episodes of dizziness with feeling of flushed skin, and nausea, which resolved spontaneously. She also had taste change and mouth soreness this week.  -Physical exam normal. Labs reviewed, CBC and CMP WNL except WBC 2.4, ANC 1.4. Iron panel still pending. Overall adequate to continue treatment.  -Will repeat labs on 10/22 to determine if she needs to hold RT.  -for skin irritation at RT site, I recommend she continue SITS bath and keep area dry and clean.  -Her rectal pain and bleeding has improved since she started chemoradiation, indicating good response to treatment. -F/u in 1 week.   2. H/o bilateral breast cancer 2007: right breast biopsy showed atypical  hyperplasia, s/p right lumpectomy in 06/2006 ER/PR+/HER2-, grade 1. Completed tamoxifen from 09/2007 - 09/2012 2008: left breast invasive ductal carcinoma, triple negative, grade 3 s/p lumpectomy in 07/2007 and eventual bilateral mastectomy; s/p adjuvant chemo AC-T and radiation   3. Genetics -patient is adopted, no known family history  -she reportedly had genetic testing which was negative. Her daughter also negative. I do not have the report.   4. HTN -on losartan -will monitor on chemo, she may require dose adjustments and or discontinuation of BP meds during therapy if she experiences hypotension  -BP 126/90 today (07/05/19)  5. Anemia -baseline labs from 06/28/19 reveals normal hemoglobin. Iron studies in low-normal range, ferritin 15. -It was recommended she start oral iron once daily if she can tolerate, and to take with orange juice/vitamin C for better absorption. If this worsens, constipation, she can take every other day. CMP unremarkable.  -Hg normal today, Iron panel still pending. (07/05/19)  6. Rectal pain, rectal bleeding -She notes constipation has currently subsided after she had 1 episode of diarrhea 2 days ago.  -She notes rectal and bleeding is improving with Radiation.  -is allergic to Vicodin, oxycodone and tramadol with itching.  -She is on Dilaudid but does not tolerate well, feels it's too strong . She only takes it for very severe pain. I encouraged her to use Tylenol as needed.    PLAN: -Lab on 10/22 to rule out severe neutropenia  -Lab and f/u in next week.   No problem-specific Assessment & Plan notes found for this encounter.   No orders of the defined types were placed in this encounter.  All questions were answered. The patient knows to call the clinic with any problems, questions or concerns. No barriers to learning was detected. I spent 15 minutes counseling the patient face to face. The total time spent in  the appointment was 20 minutes and more  than 50% was on counseling and review of test results     Truitt Merle, MD 07/05/2019   I, Joslyn Devon, am acting as scribe for Truitt Merle, MD.   I have reviewed the above documentation for accuracy and completeness, and I agree with the above.

## 2019-07-01 ENCOUNTER — Ambulatory Visit
Admission: RE | Admit: 2019-07-01 | Discharge: 2019-07-01 | Disposition: A | Discharge status: Home / Self Care | Payer: PPO | Source: Ambulatory Visit | Attending: Radiation Oncology | Admitting: Radiation Oncology

## 2019-07-01 ENCOUNTER — Other Ambulatory Visit: Payer: Self-pay

## 2019-07-01 DIAGNOSIS — C211 Malignant neoplasm of anal canal: Secondary | ICD-10-CM | POA: Diagnosis not present

## 2019-07-01 DIAGNOSIS — Z510 Encounter for antineoplastic radiation therapy: Secondary | ICD-10-CM | POA: Diagnosis not present

## 2019-07-02 ENCOUNTER — Other Ambulatory Visit: Payer: Self-pay

## 2019-07-02 ENCOUNTER — Ambulatory Visit
Admission: RE | Admit: 2019-07-02 | Discharge: 2019-07-02 | Disposition: A | Discharge status: Home / Self Care | Payer: PPO | Source: Ambulatory Visit | Attending: Radiation Oncology | Admitting: Radiation Oncology

## 2019-07-02 ENCOUNTER — Encounter: Payer: Self-pay | Admitting: Hematology

## 2019-07-02 ENCOUNTER — Inpatient Hospital Stay: Payer: PPO

## 2019-07-02 VITALS — BP 132/84 | HR 78 | Temp 99.6°F | Resp 16

## 2019-07-02 DIAGNOSIS — Z5111 Encounter for antineoplastic chemotherapy: Secondary | ICD-10-CM | POA: Diagnosis not present

## 2019-07-02 DIAGNOSIS — C210 Malignant neoplasm of anus, unspecified: Secondary | ICD-10-CM

## 2019-07-02 DIAGNOSIS — C211 Malignant neoplasm of anal canal: Secondary | ICD-10-CM | POA: Diagnosis not present

## 2019-07-02 DIAGNOSIS — Z510 Encounter for antineoplastic radiation therapy: Secondary | ICD-10-CM | POA: Diagnosis not present

## 2019-07-02 MED ORDER — SODIUM CHLORIDE 0.9% FLUSH
10.0000 mL | INTRAVENOUS | Status: DC | PRN
  Administered 2019-07-02: 10 mL
  Filled 2019-07-02: qty 10

## 2019-07-02 NOTE — Progress Notes (Signed)
Pt is approved for the $700 [facility] grant.  °

## 2019-07-02 NOTE — Progress Notes (Signed)
PICC removed without difficulty per instructions. Site unremarkable.  Vaseline gauze dressing applied with pressure dressing.  Pt instructed to leave dressing on at least 24 hours & observe for bleeding & if bleeds to hold pressure 5"  & apply additional pressure dressing & if cont to bleed through, seek medical attention.  Pt observed x 30".  Tol. Procedure well.  Pt reports some nausea with chemotherapy treatment but didn't take anything but ice/gingerale.  She also experienced some itching all over last night & took benadryl which helped.  No rash noted.  She states that her pain med keeps her awake. MD notified.

## 2019-07-02 NOTE — Patient Instructions (Signed)
PICC removed.  Site looks good, vaseline gauze applied with pressure dressing.  Please leave dressing on x 24 hours & observe for bleeding.  Apply pressure & pressure dressing but if bleeds through again, seek medical attention.  Verbal information given to pt.

## 2019-07-05 ENCOUNTER — Inpatient Hospital Stay: Payer: PPO

## 2019-07-05 ENCOUNTER — Inpatient Hospital Stay (HOSPITAL_BASED_OUTPATIENT_CLINIC_OR_DEPARTMENT_OTHER): Payer: PPO | Admitting: Hematology

## 2019-07-05 ENCOUNTER — Other Ambulatory Visit: Payer: Self-pay

## 2019-07-05 ENCOUNTER — Other Ambulatory Visit: Payer: PPO

## 2019-07-05 ENCOUNTER — Encounter: Payer: Self-pay | Admitting: Hematology

## 2019-07-05 ENCOUNTER — Ambulatory Visit
Admission: RE | Admit: 2019-07-05 | Discharge: 2019-07-05 | Disposition: A | Discharge status: Home / Self Care | Payer: PPO | Source: Ambulatory Visit | Attending: Radiation Oncology | Admitting: Radiation Oncology

## 2019-07-05 VITALS — BP 126/90 | HR 79 | Temp 98.7°F | Resp 18 | Ht 65.0 in | Wt 234.0 lb

## 2019-07-05 DIAGNOSIS — C210 Malignant neoplasm of anus, unspecified: Secondary | ICD-10-CM

## 2019-07-05 DIAGNOSIS — Z510 Encounter for antineoplastic radiation therapy: Secondary | ICD-10-CM | POA: Diagnosis not present

## 2019-07-05 DIAGNOSIS — C211 Malignant neoplasm of anal canal: Secondary | ICD-10-CM | POA: Diagnosis not present

## 2019-07-05 DIAGNOSIS — Z5111 Encounter for antineoplastic chemotherapy: Secondary | ICD-10-CM | POA: Diagnosis not present

## 2019-07-05 LAB — CMP (CANCER CENTER ONLY)
ALT: 10 U/L (ref 0–44)
AST: 17 U/L (ref 15–41)
Albumin: 3.7 g/dL (ref 3.5–5.0)
Alkaline Phosphatase: 91 U/L (ref 38–126)
Anion gap: 12 (ref 5–15)
BUN: 16 mg/dL (ref 8–23)
CO2: 22 mmol/L (ref 22–32)
Calcium: 8.7 mg/dL — ABNORMAL LOW (ref 8.9–10.3)
Chloride: 108 mmol/L (ref 98–111)
Creatinine: 0.91 mg/dL (ref 0.44–1.00)
GFR, Est AFR Am: >60 mL/min (ref >60)
GFR, Estimated: >60 mL/min (ref >60)
Glucose, Bld: 97 mg/dL (ref 70–99)
Potassium: 3.6 mmol/L (ref 3.5–5.1)
Sodium: 142 mmol/L (ref 135–145)
Total Bilirubin: 0.4 mg/dL (ref 0.3–1.2)
Total Protein: 7 g/dL (ref 6.5–8.1)

## 2019-07-05 LAB — CBC WITH DIFFERENTIAL (CANCER CENTER ONLY)
Abs Immature Granulocytes: 0.02 10*3/uL (ref 0.00–0.07)
Basophils Absolute: 0 10*3/uL (ref 0.0–0.1)
Basophils Relative: 1 %
Eosinophils Absolute: 0.1 10*3/uL (ref 0.0–0.5)
Eosinophils Relative: 4 %
HCT: 41.4 % (ref 36.0–46.0)
Hemoglobin: 13 g/dL (ref 12.0–15.0)
Immature Granulocytes: 1 %
Lymphocytes Relative: 31 %
Lymphs Abs: 0.7 10*3/uL (ref 0.7–4.0)
MCH: 26.1 pg (ref 26.0–34.0)
MCHC: 31.4 g/dL (ref 30.0–36.0)
MCV: 83 fL (ref 80.0–100.0)
Monocytes Absolute: 0.2 10*3/uL (ref 0.1–1.0)
Monocytes Relative: 7 %
Neutro Abs: 1.4 10*3/uL — ABNORMAL LOW (ref 1.7–7.7)
Neutrophils Relative %: 56 %
Platelet Count: 181 10*3/uL (ref 150–400)
RBC: 4.99 MIL/uL (ref 3.87–5.11)
RDW: 14 % (ref 11.5–15.5)
WBC Count: 2.4 10*3/uL — ABNORMAL LOW (ref 4.0–10.5)
nRBC: 0 % (ref 0.0–0.2)

## 2019-07-05 LAB — IRON AND TIBC
Iron: 155 ug/dL — ABNORMAL HIGH (ref 41–142)
Saturation Ratios: 50 % (ref 21–57)
TIBC: 313 ug/dL (ref 236–444)
UIBC: 158 ug/dL (ref 120–384)

## 2019-07-05 LAB — FERRITIN: Ferritin: 106 ng/mL (ref 11–307)

## 2019-07-06 ENCOUNTER — Other Ambulatory Visit: Payer: Self-pay

## 2019-07-06 ENCOUNTER — Ambulatory Visit
Admission: RE | Admit: 2019-07-06 | Discharge: 2019-07-06 | Disposition: A | Discharge status: Home / Self Care | Payer: PPO | Source: Ambulatory Visit | Attending: Radiation Oncology | Admitting: Radiation Oncology

## 2019-07-06 ENCOUNTER — Telehealth: Payer: Self-pay | Admitting: Hematology

## 2019-07-06 DIAGNOSIS — Z510 Encounter for antineoplastic radiation therapy: Secondary | ICD-10-CM | POA: Diagnosis not present

## 2019-07-06 DIAGNOSIS — C211 Malignant neoplasm of anal canal: Secondary | ICD-10-CM | POA: Diagnosis not present

## 2019-07-06 NOTE — Telephone Encounter (Signed)
Scheduled appt per 10/19 los. ° °Spoke with pt and she is aware of the appt date and time. °

## 2019-07-06 NOTE — Progress Notes (Signed)
Counseling Intern Notes: Spoke to pt via phone on 07/06/2019 for a counseling intake appt.  Pt was emotional and stated she is feeling "overwhelmed" and  "emotionally exhausted" because of challenges related to her cancer and her life. Furthermore, pt stated she is experiencing a "loss of control" and she is used to being in control. Pt denied having any history of SI or any current SI. Pt reported that she lives with her adult daughter and is temporary guardian for her 62 year old god daughter. Pt stated she can not find peace at home, but uses quiet moments in her car, alone time at infusion appts, and trips to the store as respite from the stress at home. Pt worries about her daughter and is grieving the loss of her parents. Pt stated that she usually internalzies her experiences and she hopes to use counseling as a space to process her experiences and release pent up emotions. Additionally, pt agreed that counseling could provide an opportunity for learning coping strategies for distress management and ideas to promote emotion regulation.    Next Scheduled Counseling Appt: 07/13/2019 at 11:00am via phone  [facility] Counseling Intern Voicemail:  [phone]

## 2019-07-07 ENCOUNTER — Other Ambulatory Visit: Payer: Self-pay

## 2019-07-07 ENCOUNTER — Ambulatory Visit
Admission: RE | Admit: 2019-07-07 | Discharge: 2019-07-07 | Disposition: A | Discharge status: Home / Self Care | Payer: PPO | Source: Ambulatory Visit | Attending: Radiation Oncology | Admitting: Radiation Oncology

## 2019-07-07 DIAGNOSIS — Z510 Encounter for antineoplastic radiation therapy: Secondary | ICD-10-CM | POA: Diagnosis not present

## 2019-07-07 DIAGNOSIS — C211 Malignant neoplasm of anal canal: Secondary | ICD-10-CM | POA: Diagnosis not present

## 2019-07-08 ENCOUNTER — Ambulatory Visit
Admission: RE | Admit: 2019-07-08 | Discharge: 2019-07-08 | Disposition: A | Discharge status: Home / Self Care | Payer: PPO | Source: Ambulatory Visit | Attending: Radiation Oncology | Admitting: Radiation Oncology

## 2019-07-08 ENCOUNTER — Inpatient Hospital Stay: Payer: PPO

## 2019-07-08 ENCOUNTER — Other Ambulatory Visit: Payer: Self-pay

## 2019-07-08 DIAGNOSIS — Z5111 Encounter for antineoplastic chemotherapy: Secondary | ICD-10-CM | POA: Diagnosis not present

## 2019-07-08 DIAGNOSIS — C211 Malignant neoplasm of anal canal: Secondary | ICD-10-CM | POA: Diagnosis not present

## 2019-07-08 DIAGNOSIS — C210 Malignant neoplasm of anus, unspecified: Secondary | ICD-10-CM

## 2019-07-08 DIAGNOSIS — Z510 Encounter for antineoplastic radiation therapy: Secondary | ICD-10-CM | POA: Diagnosis not present

## 2019-07-08 LAB — CBC WITH DIFFERENTIAL (CANCER CENTER ONLY)
Abs Immature Granulocytes: 0 10*3/uL (ref 0.00–0.07)
Basophils Absolute: 0 10*3/uL (ref 0.0–0.1)
Basophils Relative: 1 %
Eosinophils Absolute: 0.1 10*3/uL (ref 0.0–0.5)
Eosinophils Relative: 3 %
HCT: 39.5 % (ref 36.0–46.0)
Hemoglobin: 12.6 g/dL (ref 12.0–15.0)
Immature Granulocytes: 0 %
Lymphocytes Relative: 30 %
Lymphs Abs: 0.5 10*3/uL — ABNORMAL LOW (ref 0.7–4.0)
MCH: 26.4 pg (ref 26.0–34.0)
MCHC: 31.9 g/dL (ref 30.0–36.0)
MCV: 82.8 fL (ref 80.0–100.0)
Monocytes Absolute: 0.2 10*3/uL (ref 0.1–1.0)
Monocytes Relative: 14 %
Neutro Abs: 0.8 10*3/uL — ABNORMAL LOW (ref 1.7–7.7)
Neutrophils Relative %: 52 %
Platelet Count: 128 10*3/uL — ABNORMAL LOW (ref 150–400)
RBC: 4.77 MIL/uL (ref 3.87–5.11)
RDW: 13.7 % (ref 11.5–15.5)
WBC Count: 1.6 10*3/uL — ABNORMAL LOW (ref 4.0–10.5)
nRBC: 0 % (ref 0.0–0.2)

## 2019-07-08 LAB — CMP (CANCER CENTER ONLY)
ALT: 14 U/L (ref 0–44)
AST: 18 U/L (ref 15–41)
Albumin: 3.3 g/dL — ABNORMAL LOW (ref 3.5–5.0)
Alkaline Phosphatase: 86 U/L (ref 38–126)
Anion gap: 8 (ref 5–15)
BUN: 10 mg/dL (ref 8–23)
CO2: 27 mmol/L (ref 22–32)
Calcium: 8.6 mg/dL — ABNORMAL LOW (ref 8.9–10.3)
Chloride: 109 mmol/L (ref 98–111)
Creatinine: 1.03 mg/dL — ABNORMAL HIGH (ref 0.44–1.00)
GFR, Est AFR Am: >60 mL/min (ref >60)
GFR, Estimated: 56 mL/min — ABNORMAL LOW (ref >60)
Glucose, Bld: 115 mg/dL — ABNORMAL HIGH (ref 70–99)
Potassium: 3.6 mmol/L (ref 3.5–5.1)
Sodium: 144 mmol/L (ref 135–145)
Total Bilirubin: 0.3 mg/dL (ref 0.3–1.2)
Total Protein: 6.3 g/dL — ABNORMAL LOW (ref 6.5–8.1)

## 2019-07-08 NOTE — Telephone Encounter (Addendum)
Spoke with patient regarding lab results.  Per Cira Rue still neutropenic. Instructed her to practice neutropenic precautions and to report any signs of infection fever/chills to [name].  Instructed her to drink more fluids.  She verbalized an understanding.   ----- Message from Alla Feeling, NP sent at 07/08/2019  2:43 PM EDT ----- OK thanks! You're so quick.   [name], can you please review her labs. Neutropenic precautions etc. I will see her Monday. Encourage her to drink more, Cr >1  Thanks all ----- Message ----- From: Hayden Pedro, PA-C Sent: 07/08/2019   2:09 PM EDT To: Alla Feeling, NP  ANC was 800 we're going to treat Thanks,Alison  ----- Message ----- From: Alla Feeling, NP Sent: 07/05/2019   5:06 PM EDT To: Jonnie Finner, RN, #  Will do! ----- Message ----- From: Truitt Merle, MD Sent: 07/05/2019   4:15 PM EDT To: Jonnie Finner, RN, #  She is doing well overall, but ANC dropped to 1.4 today. Plan to repeat this Thursday, please keep an eye on the result, to see if she has severe neutropenia and if radiation needs to be held.  I am not here on Thursday.   Thanks  Krista Blue

## 2019-07-09 ENCOUNTER — Ambulatory Visit
Admission: RE | Admit: 2019-07-09 | Discharge: 2019-07-09 | Disposition: A | Discharge status: Home / Self Care | Payer: PPO | Source: Ambulatory Visit | Attending: Radiation Oncology | Admitting: Radiation Oncology

## 2019-07-09 ENCOUNTER — Other Ambulatory Visit: Payer: Self-pay

## 2019-07-09 DIAGNOSIS — Z510 Encounter for antineoplastic radiation therapy: Secondary | ICD-10-CM | POA: Diagnosis not present

## 2019-07-09 DIAGNOSIS — C211 Malignant neoplasm of anal canal: Secondary | ICD-10-CM | POA: Diagnosis not present

## 2019-07-11 NOTE — Progress Notes (Signed)
[facility]   [phone] [phone]   Clinic Follow up Note   Patient Care Team: Kathyrn Lass, MD as PCP - General (Family Medicine) Wonda Horner, MD as Consulting Physician (Gastroenterology) Truitt Merle, MD as Consulting Physician (Hematology) Alla Feeling, NP as Nurse Practitioner (Nurse Practitioner) 07/12/2019  CHIEF COMPLAINT: F/u anal cancer   SUMMARY OF ONCOLOGIC HISTORY: Oncology History Overview Note  Breast cancer of upper-outer quadrant of right female breast, (ER/PR positive, her2/Neu negative)   Primary site: Breast (Right)   Staging method: AJCC 7th Edition   Clinical: Stage IA (T1a, N0, cM0) signed by [name], MD on 09/07/2013 11:14 AM   Pathologic: Stage IA (T1a, N0, cM0) signed by [name], MD on 09/07/2013 11:14 AM   Summary: Stage IA (T1a, N0, cM0)  Breast cancer of upper-outer quadrant of left female breast (ER/PR/Her2Neu negative)   Primary site: Breast (Left)   Staging method: AJCC 7th Edition   Clinical: Stage IIA (T2, N0, cM0) signed by [name], MD on 09/07/2013 11:35 AM   Pathologic: Stage IIA (T2, N0, cM0) signed by [name], MD on 09/07/2013 11:35 AM   Summary: Stage IIA (T2, N0, cM0)     Breast cancer of upper-outer quadrant of left female breast ([place])  07/10/2007 Procedure   US biopsy showed invasive ductal carcinoma 0.8cm, Nottingham grade 3   07/30/2007 Surgery   She had left breast lumpectomy and LN biopsy which showed high grade invasive ductal cancer Nottingham grade 3, 2.7 cm, ER/PR/Her2 neu negative   11/24/2007 Surgery   Patient elected for bilateral mastectomy   09/07/2008 - 03/08/2009 Chemotherapy   dates are approximate: she received adriamycin and cytoxan followed by Taxol   03/08/2009 - 05/08/2009 Radiation Therapy   dates are approximate, she received XRT    Breast cancer of upper-outer quadrant of right female breast ([place])  07/10/2006 Procedure   stereotactic biopsy showed  atypical hyperplasia   10/23/2006 Surgery   right lumpectomy showed invasive ductal ca (Nottingham grade 1)  87m and DCIS, ER/PR positive Her 2 negative   10/09/2007 - 10/08/2012 Chemotherapy   Patient was placed on Tamoxifen   03/24/2017 Imaging   No acute findings. No evidence of recurrent carcinoma or metastatic disease   [place]  06/04/2019 Procedure   Colonoscopy per Dr. SAnson Fret Findings-the digital rectal exam revealed a 3 cm diameter firm rectal mass.  The mass was noncircumferential and located predominantly at the right bowel wall at the anorectal junction. A nonobstructing mass was found at the anus and in the rectum    06/04/2019 Initial Biopsy   Follow pathology: Large intestine, rectum biopsy: Invasive well to moderately differentiated squamous cell carcinoma.  No rectal mucosa present.  There is strong diffuse expression of P 16 immunostain.  CDX 2, p63 and mCEA immunostains are also used in the diagnostic work-up of the case.   06/04/2019 Initial Diagnosis   [place]   06/21/2019 PET scan   IMPRESSION: 1. Anorectal primary. No hypermetabolic metastatic disease within the chest, abdomen, or pelvis. Perirectal nodes, at least 1 of which is new since 02/26/2018 CT, suspicious based on size and interval development. 2. Mild limitations secondary to beam hardening artifact from bilateral hip arthroplasty. 3.  Aortic Atherosclerosis (ICD10-I70.0).   06/28/2019 Cancer Staging   Staging form: Anus, AJCC 8th Edition - Clinical stage from 06/28/2019: Stage IIA (cT2, cN0, cM0) - Signed by BAlla Feeling NP on 06/28/2019   06/28/2019 -  Chemotherapy   concurrent chemoRT with Mitomycin and 5FU on week 1 and week 5 on starting 06/28/19.    06/28/2019 -  Radiation Therapy   concurrent chemoRT with Dr [name] starting on 06/28/19.      CURRENT THERAPY: concurrent chemoRT with 5FU/Mitomycin starting 06/28/19  INTERVAL HISTORY: Ms. Dancer returns for f/u as  scheduled. She began chemoRT on 06/28/19. She is doing well clinically, rectal bleeding has resolved and BMs are easier. She can sit more comfortably. Appetite is good, drinks ensure. Taste is decreased. Has mild nausea but does not take medication. Not interfering with po intake. Has 2-3 easy formed BM daily. Has only mild rectal pain with BM. Has mild low back pain and abdominal cramping. Her biggest concern is rectal and vaginal itching. She has tried dermaplast spray which does not help. She called after hours line and Shona Simpson, PA called in diflucan. Otherwise, denies fever, chills, cough, chest pain, dyspnea.   MEDICAL HISTORY:  Past Medical History:  Diagnosis Date  . Anxiety   . Arthritis   . Bilateral breast cancer ([place]) 10/21/2013  . Breast cancer ([place])   . Cancer ([place])   . Depression   . Gallstones   . GERD (gastroesophageal reflux disease)    doesn't take any meds for this  . Headache    h/o migraines      . History of bladder infections   . History of hiatal hernia   . History of migraine   . Hyperlipidemia    takes Simvastatin daily  . Hypertension    takes Hyzaar  . Joint pain   . Joint swelling   . Lumbar stenosis   . Neuropathy 09/07/2013  . Pneumonia   . Pre-diabetes     SURGICAL HISTORY: Past Surgical History:  Procedure Laterality Date  . ABDOMINAL HYSTERECTOMY    . bone spur removed from left foot    . CHOLECYSTECTOMY    . COLONOSCOPY    . double mastectomy     . HERNIA REPAIR     umbilical  . right knee arthroscopy    . right shoulder arthroscopy    . surgery for hiatal hernia    . TONSILLECTOMY    . TOTAL HIP ARTHROPLASTY Left 02/12/2013  . TOTAL HIP ARTHROPLASTY Left 02/12/2013   Procedure: LEFT TOTAL HIP ARTHROPLASTY;  Surgeon: Kerin Salen, MD;  Location: [facility];  Service: Orthopedics;  Laterality: Left;  . TOTAL HIP ARTHROPLASTY Right 05/03/2013   Procedure: TOTAL HIP ARTHROPLASTY;  Surgeon: Kerin Salen, MD;  Location: [facility];  Service:  Orthopedics;  Laterality: Right;  . TOTAL KNEE ARTHROPLASTY Right 02/27/2015   Procedure: TOTAL KNEE ARTHROPLASTY;  Surgeon: Frederik Pear, MD;  Location: [facility];  Service: Orthopedics;  Laterality: Right;  . TOTAL SHOULDER ARTHROPLASTY Right 09/12/2016   Procedure: RIGHT TOTAL SHOULDER ARTHROPLASTY;  Surgeon: Tania Ade, MD;  Location: [facility];  Service: Orthopedics;  Laterality: Right;  RIGHT TOTAL SHOULDER ARTHROPLASTY    I have reviewed the social history and family history with the patient and they are unchanged from previous note.  ALLERGIES:  is allergic to oxycodone-acetaminophen; codeine; and vicodin [hydrocodone-acetaminophen].  MEDICATIONS:  Current Outpatient Medications  Medication Sig Dispense Refill  . fluconazole (DIFLUCAN) 100 MG tablet Take one tablet po on 07/12/2019, then second dose on 07/14/2019 2 tablet 0  . HYDROmorphone (DILAUDID) 2 MG tablet Take 0.5-1 tablets (1-2 mg total) by mouth every 4 (four) hours as needed for severe pain. 60 tablet 0  .  LINZESS 72 MCG capsule     . losartan (COZAAR) 100 MG tablet TK 1 T PO D  6  . nitroGLYCERIN (NITROSTAT) 0.4 MG SL tablet Place 0.4 mg under the tongue every 5 (five) minutes x 3 doses as needed for chest pain.  0  . ondansetron (ZOFRAN ODT) 4 MG disintegrating tablet Take 1 tablet (4 mg total) by mouth every 8 (eight) hours as needed for nausea or vomiting. 6 tablet 0  . PROAIR HFA 108 (90 Base) MCG/ACT inhaler 2 inhalations every 4-6 hours as needed. (Patient taking differently: Inhale 2 puffs into the lungs every 6 (six) hours as needed for wheezing or shortness of breath. ) 18 g 1  . promethazine (PHENERGAN) 25 MG tablet TK 1 T PO QID FOR 3 DAYS PRF VOM     No current facility-administered medications for this visit.     PHYSICAL EXAMINATION: ECOG PERFORMANCE STATUS: 1 - Symptomatic but completely ambulatory  Vitals:   07/12/19 1458  BP: (!) 154/107  Pulse: 72  Resp: 20  Temp: 97.8 F (36.6 C)  SpO2: 99%    Filed Weights   07/12/19 1458  Weight: 228 lb 3.2 oz (103.5 kg)    GENERAL:alert, no distress and comfortable SKIN: no rash  EYES:  sclera clear OROPHARYNX: no thrush or ulcers  LUNGS: clear with normal breathing effort HEART: regular rate & rhythm, no lower extremity edema ABDOMEN: abdomen soft, non-tender and normal bowel sounds. RECTAL exam shows perineal hyperpigmentation, no significant erythema, skin breakdown, or ulcers. Few bumps in low pelvic area at right labia NEURO: alert & oriented x 3 with fluent speech  LABORATORY DATA:  I have reviewed the data as listed CBC Latest Ref Rng & Units 07/12/2019 07/08/2019 07/05/2019  WBC 4.0 - 10.5 K/uL 2.6(L) 1.6(L) 2.4(L)  Hemoglobin 12.0 - 15.0 g/dL 12.6 12.6 13.0  Hematocrit 36.0 - 46.0 % 39.6 39.5 41.4  Platelets 150 - 400 K/uL 72(L) 128(L) 181     CMP Latest Ref Rng & Units 07/12/2019 07/08/2019 07/05/2019  Glucose 70 - 99 mg/dL 97 115(H) 97  BUN 8 - 23 mg/dL _0 Creatinine 0.44 - 1.00 mg/dL 1.05(H) 1.03(H) 0.91  Sodium 135 - 145 mmol/L 144 144 142  Potassium 3.5 - 5.1 mmol/L 3.8 3.6 3.6  Chloride 98 - 111 mmol/L 110 109 108  CO2 22 - 32 mmol/L _1 Calcium 8.9 - 10.3 mg/dL 8.9 8.6(L) 8.7(L)  Total Protein 6.5 - 8.1 g/dL 6.7 6.3(L) 7.0  Total Bilirubin 0.3 - 1.2 mg/dL 0.3 0.3 0.4  Alkaline Phos 38 - 126 U/L 85 86 91  AST 15 - 41 U/L _2 ALT 0 - 44 U/L _3 RADIOGRAPHIC STUDIES: I have personally reviewed the radiological images as listed and agreed with the findings in the report. No results found.   ASSESSMENT & PLAN: Levon Penning is a 68 y.o. female with   1. Anal Squamous Cell Carcinoma, TxNxM0 -She was recently diagnosed in 05/2019. She presented with rectal bleeding and anal pressure.Biopsy showeda 3 cm mass at the anorectal junction, path confirmedwell to moderately differentiated squamous cell carcinoma. 06/21/19 PET scan shows no metastasis.  -She isa candidate for definitive  chemotherapy and radiation, treatment goal is curative with with success rate of 80-90%. We discussed surgery is usually reserved for residual disease or local recurrence after concurrent chemoradiation. -She started concurrent chemoRT with Mitomycin and 5FU on week 1 and  week 5 on 06/28/19.  -She tolerating first week of ChemoRT moderately well with 2 episodes of dizziness with feeling of flushed skin, and nausea, which resolved spontaneously. She also had taste change and mouth soreness during week 2 -Ms. Quirion appears well today. Rectal bleeding resolved, pain is minimal. She has easy BM now. Mucositis resolved. She has recovered well -main concern is perineal itching, exam shows hyperpigmentation, no significant skin breakdown. Bryson Ha, [place] in [place] called in diflucan.  -Labs reviewed. [place] recovering to 1.5. PLT 72. We discussed bleeding risk. CMP stable.  -she will continue RT -return for lab and f/u next week  2. H/o bilateral breast cancer 2007: right breast biopsy showed atypical hyperplasia, s/p right lumpectomy in 06/2006 ER/PR+/HER2-, grade 1. Completed tamoxifen from 09/2007 - 09/2012 2008: left breast invasive ductal carcinoma, triple negative, grade 3 s/p lumpectomy in 07/2007 and eventual bilateral mastectomy; s/p adjuvant chemo AC-T and radiation   3. Genetics -patient is adopted, no known family history  -she reportedly had genetic testing which was negative. Her daughter also negative. I do not have the report.  4. HTN -on losartan -will monitor on chemo, she may require dose adjustments and or discontinuation of BP meds during therapy if she experiences hypotension  -BP 154/107 today which I feel is in error, she states NT used a cuff that was too small which would give false elevated reading.  5. Anemia -baseline labs from 06/28/19 reveals normal hemoglobin.Iron studies in low-normal range, ferritin 15. -It was recommended she start oral iron once daily if she can  tolerate, and to take with orange juice/vitamin C for better absorption.  -Hgb normal, ferritin 106 on 10/19  6. Rectal pain, rectal bleeding -rectal bleeding resolved after she started treatment, now has easy BM 2-3 times daily.  -pain is mild now, does not like to take medication  -we discussed this indicates good response to treatment  PLAN: -Labs reviewed -Continue RT -lab and f/u next week (week 4)  All questions were answered. The patient knows to call the clinic with any problems, questions or concerns. No barriers to learning was detected.    Alla Feeling, NP 07/12/19

## 2019-07-12 ENCOUNTER — Inpatient Hospital Stay (HOSPITAL_BASED_OUTPATIENT_CLINIC_OR_DEPARTMENT_OTHER): Payer: PPO | Admitting: Nurse Practitioner

## 2019-07-12 ENCOUNTER — Ambulatory Visit
Admission: RE | Admit: 2019-07-12 | Discharge: 2019-07-12 | Disposition: A | Discharge status: Home / Self Care | Payer: PPO | Source: Ambulatory Visit | Attending: Radiation Oncology | Admitting: Radiation Oncology

## 2019-07-12 ENCOUNTER — Other Ambulatory Visit: Payer: Self-pay | Admitting: Radiation Oncology

## 2019-07-12 ENCOUNTER — Other Ambulatory Visit: Payer: Self-pay

## 2019-07-12 ENCOUNTER — Inpatient Hospital Stay: Payer: PPO

## 2019-07-12 ENCOUNTER — Telehealth: Payer: Self-pay | Admitting: *Deleted

## 2019-07-12 ENCOUNTER — Other Ambulatory Visit: Payer: PPO

## 2019-07-12 ENCOUNTER — Encounter: Payer: Self-pay | Admitting: Nurse Practitioner

## 2019-07-12 VITALS — BP 154/107 | HR 72 | Temp 97.8°F | Resp 20 | Ht 65.0 in | Wt 228.2 lb

## 2019-07-12 DIAGNOSIS — C210 Malignant neoplasm of anus, unspecified: Secondary | ICD-10-CM

## 2019-07-12 DIAGNOSIS — C211 Malignant neoplasm of anal canal: Secondary | ICD-10-CM | POA: Diagnosis not present

## 2019-07-12 DIAGNOSIS — Z5111 Encounter for antineoplastic chemotherapy: Secondary | ICD-10-CM | POA: Diagnosis not present

## 2019-07-12 DIAGNOSIS — Z510 Encounter for antineoplastic radiation therapy: Secondary | ICD-10-CM | POA: Diagnosis not present

## 2019-07-12 LAB — CBC WITH DIFFERENTIAL (CANCER CENTER ONLY)
Abs Immature Granulocytes: 0 10*3/uL (ref 0.00–0.07)
Basophils Absolute: 0 10*3/uL (ref 0.0–0.1)
Basophils Relative: 0 %
Eosinophils Absolute: 0.1 10*3/uL (ref 0.0–0.5)
Eosinophils Relative: 3 %
HCT: 39.6 % (ref 36.0–46.0)
Hemoglobin: 12.6 g/dL (ref 12.0–15.0)
Immature Granulocytes: 0 %
Lymphocytes Relative: 22 %
Lymphs Abs: 0.6 10*3/uL — ABNORMAL LOW (ref 0.7–4.0)
MCH: 26.4 pg (ref 26.0–34.0)
MCHC: 31.8 g/dL (ref 30.0–36.0)
MCV: 83 fL (ref 80.0–100.0)
Monocytes Absolute: 0.5 10*3/uL (ref 0.1–1.0)
Monocytes Relative: 19 %
Neutro Abs: 1.5 10*3/uL — ABNORMAL LOW (ref 1.7–7.7)
Neutrophils Relative %: 56 %
Platelet Count: 72 10*3/uL — ABNORMAL LOW (ref 150–400)
RBC: 4.77 MIL/uL (ref 3.87–5.11)
RDW: 13.9 % (ref 11.5–15.5)
WBC Count: 2.6 10*3/uL — ABNORMAL LOW (ref 4.0–10.5)
nRBC: 0 % (ref 0.0–0.2)

## 2019-07-12 LAB — CMP (CANCER CENTER ONLY)
ALT: 28 U/L (ref 0–44)
AST: 27 U/L (ref 15–41)
Albumin: 3.5 g/dL (ref 3.5–5.0)
Alkaline Phosphatase: 85 U/L (ref 38–126)
Anion gap: 9 (ref 5–15)
BUN: 12 mg/dL (ref 8–23)
CO2: 25 mmol/L (ref 22–32)
Calcium: 8.9 mg/dL (ref 8.9–10.3)
Chloride: 110 mmol/L (ref 98–111)
Creatinine: 1.05 mg/dL — ABNORMAL HIGH (ref 0.44–1.00)
GFR, Est AFR Am: >60 mL/min (ref >60)
GFR, Estimated: 55 mL/min — ABNORMAL LOW (ref >60)
Glucose, Bld: 97 mg/dL (ref 70–99)
Potassium: 3.8 mmol/L (ref 3.5–5.1)
Sodium: 144 mmol/L (ref 135–145)
Total Bilirubin: 0.3 mg/dL (ref 0.3–1.2)
Total Protein: 6.7 g/dL (ref 6.5–8.1)

## 2019-07-12 MED ORDER — FLUCONAZOLE 100 MG PO TABS: ORAL_TABLET | ORAL | 0 refills | Status: DC

## 2019-07-12 NOTE — Telephone Encounter (Signed)
Spoke with the patient regarding her triage call over the weekend.  She states her symptoms are persistent and she was miserable.  I let her know that I spoke with the PA and she wants to see her in the clinic tomorrow before her radiation treatment.  I also informed her that a prescription was sent to her pharmacy.  She verbalized understanding.  Will continue to follow as necessary.  Gloriajean Dell. Leonie Green, BSN

## 2019-07-13 ENCOUNTER — Encounter: Payer: Self-pay | Admitting: Radiation Oncology

## 2019-07-13 ENCOUNTER — Telehealth: Payer: Self-pay | Admitting: Nurse Practitioner

## 2019-07-13 ENCOUNTER — Other Ambulatory Visit: Payer: Self-pay

## 2019-07-13 ENCOUNTER — Ambulatory Visit
Admission: RE | Admit: 2019-07-13 | Discharge: 2019-07-13 | Disposition: A | Discharge status: Home / Self Care | Payer: PPO | Source: Ambulatory Visit | Attending: Radiation Oncology | Admitting: Radiation Oncology

## 2019-07-13 DIAGNOSIS — Z510 Encounter for antineoplastic radiation therapy: Secondary | ICD-10-CM | POA: Diagnosis not present

## 2019-07-13 DIAGNOSIS — C211 Malignant neoplasm of anal canal: Secondary | ICD-10-CM | POA: Diagnosis not present

## 2019-07-13 NOTE — Telephone Encounter (Signed)
Scheduled appt per 10/26 los.  Spoke with pt and she is aware of the appt date and time.

## 2019-07-13 NOTE — Progress Notes (Signed)
Counseling Intern Notes:  Spoke to Cynthia Velasquez on 07/13/2019 for counseling appt via phone. Cynthia Velasquez reported feeling better than last week and stated that our previous session provided her space to cry and process emotions she had held inside. Cynthia Velasquez reported having difficulty eating because of her loss of taste and that she has lost over 6 lbs this week. However, Cynthia Velasquez still finds joy in the act of cooking itself and tries to eat as best she can. Cynthia Velasquez stated she feels that she "is on the right road" to recovery and healing from her cancer.  Cynthia Velasquez stated although she is still upset by all the things that are out of her control right now, she "accepts the things she can not change and moves forward from there." Cynthia Velasquez reported that she finds small moments of control in her life: decide what to cook for dinner, choose to go to the grocery store, or choose move things around in her home to redecorate. Cynthia Velasquez reported the plan for her goddaughter to move back in with the mother in December is still in motion, but the Cynthia Velasquez has agreed to watch her during the school day. Cynthia Velasquez stated that she is "almost at the end of the tunnel" as her tx comes to an end next month and that she will then be taking the time to heal.     Next counseling appt:  07/27/2019 at 11:00am via phone  [facility] Counseling Intern Voicemail:  [phone]

## 2019-07-13 NOTE — Progress Notes (Signed)
The patient was seen today for a quick skin check. She developed severe skin itching and was treated with diflucan. She noticed improvement and is due to take her second dose tomorrow. On exam she has mild white discharge of the vulva. The groins, labia majora and minora as well as gluteal and perianal tissues are intact with mild hyperpigmentation without desquamation. Seborrheic keratoses are noted in the pannicular fold above the mons and groin. We will follow her skin and she's instructed to take the second dose of diflucan tomorrow.     Carola Rhine, PAC

## 2019-07-14 ENCOUNTER — Ambulatory Visit
Admission: RE | Admit: 2019-07-14 | Discharge: 2019-07-14 | Disposition: A | Discharge status: Home / Self Care | Payer: PPO | Source: Ambulatory Visit | Attending: Radiation Oncology | Admitting: Radiation Oncology

## 2019-07-14 ENCOUNTER — Other Ambulatory Visit: Payer: Self-pay

## 2019-07-14 DIAGNOSIS — C211 Malignant neoplasm of anal canal: Secondary | ICD-10-CM | POA: Diagnosis not present

## 2019-07-14 DIAGNOSIS — Z510 Encounter for antineoplastic radiation therapy: Secondary | ICD-10-CM | POA: Diagnosis not present

## 2019-07-15 ENCOUNTER — Other Ambulatory Visit: Payer: Self-pay

## 2019-07-15 ENCOUNTER — Ambulatory Visit
Admission: RE | Admit: 2019-07-15 | Discharge: 2019-07-15 | Disposition: A | Discharge status: Home / Self Care | Payer: PPO | Source: Ambulatory Visit | Attending: Radiation Oncology | Admitting: Radiation Oncology

## 2019-07-15 DIAGNOSIS — I10 Essential (primary) hypertension: Secondary | ICD-10-CM | POA: Diagnosis not present

## 2019-07-15 DIAGNOSIS — F331 Major depressive disorder, recurrent, moderate: Secondary | ICD-10-CM | POA: Diagnosis not present

## 2019-07-15 DIAGNOSIS — E7800 Pure hypercholesterolemia, unspecified: Secondary | ICD-10-CM | POA: Diagnosis not present

## 2019-07-15 DIAGNOSIS — M150 Primary generalized (osteo)arthritis: Secondary | ICD-10-CM | POA: Diagnosis not present

## 2019-07-15 DIAGNOSIS — Z853 Personal history of malignant neoplasm of breast: Secondary | ICD-10-CM | POA: Diagnosis not present

## 2019-07-15 DIAGNOSIS — F329 Major depressive disorder, single episode, unspecified: Secondary | ICD-10-CM | POA: Diagnosis not present

## 2019-07-15 DIAGNOSIS — C211 Malignant neoplasm of anal canal: Secondary | ICD-10-CM | POA: Diagnosis not present

## 2019-07-15 DIAGNOSIS — Z510 Encounter for antineoplastic radiation therapy: Secondary | ICD-10-CM | POA: Diagnosis not present

## 2019-07-16 ENCOUNTER — Ambulatory Visit
Admission: RE | Admit: 2019-07-16 | Discharge: 2019-07-16 | Disposition: A | Discharge status: Home / Self Care | Payer: PPO | Source: Ambulatory Visit | Attending: Radiation Oncology | Admitting: Radiation Oncology

## 2019-07-16 ENCOUNTER — Other Ambulatory Visit: Payer: Self-pay

## 2019-07-16 DIAGNOSIS — C211 Malignant neoplasm of anal canal: Secondary | ICD-10-CM | POA: Diagnosis not present

## 2019-07-16 DIAGNOSIS — Z510 Encounter for antineoplastic radiation therapy: Secondary | ICD-10-CM | POA: Diagnosis not present

## 2019-07-18 NOTE — Progress Notes (Signed)
Cynthia Velasquez   [phone] Fax:([phone]) [phone]   Clinic Follow up Note   Patient Care Team: Kathyrn Lass, MD as PCP - General (Family Medicine) Wonda Horner, MD as Consulting Physician (Gastroenterology) Truitt Merle, MD as Consulting Physician (Hematology) Alla Feeling, NP as Nurse Practitioner (Nurse Practitioner) 07/19/2019  CHIEF COMPLAINT: F/u anal cancer   SUMMARY OF ONCOLOGIC HISTORY: Oncology History Overview Note  Breast cancer of upper-outer quadrant of right female breast, (ER/PR positive, her2/Neu negative)   Primary site: Breast (Right)   Staging method: AJCC 7th Edition   Clinical: Stage IA (T1a, N0, cM0) signed by [name], MD on 09/07/2013 11:14 AM   Pathologic: Stage IA (T1a, N0, cM0) signed by [name], MD on 09/07/2013 11:14 AM   Summary: Stage IA (T1a, N0, cM0)  Breast cancer of upper-outer quadrant of left female breast (ER/PR/Her2Neu negative)   Primary site: Breast (Left)   Staging method: AJCC 7th Edition   Clinical: Stage IIA (T2, N0, cM0) signed by [name], MD on 09/07/2013 11:35 AM   Pathologic: Stage IIA (T2, N0, cM0) signed by [name], MD on 09/07/2013 11:35 AM   Summary: Stage IIA (T2, N0, cM0)     Breast cancer of upper-outer quadrant of left female breast ([place])  07/10/2007 Procedure   US biopsy showed invasive ductal carcinoma 0.8cm, Nottingham grade 3   07/30/2007 Surgery   She had left breast lumpectomy and LN biopsy which showed high grade invasive ductal cancer Nottingham grade 3, 2.7 cm, ER/PR/Her2 neu negative   11/24/2007 Surgery   Patient elected for bilateral mastectomy   09/07/2008 - 03/08/2009 Chemotherapy   dates are approximate: she received adriamycin and cytoxan followed by Taxol   03/08/2009 - 05/08/2009 Radiation Therapy   dates are approximate, she received XRT    Breast cancer of upper-outer quadrant of right female breast ([place])  07/10/2006 Procedure   stereotactic biopsy showed  atypical hyperplasia   10/23/2006 Surgery   right lumpectomy showed invasive ductal ca (Nottingham grade 1)  24m and DCIS, ER/PR positive Her 2 negative   10/09/2007 - 10/08/2012 Chemotherapy   Patient was placed on Tamoxifen   03/24/2017 Imaging   No acute findings. No evidence of recurrent carcinoma or metastatic disease   Anal cancer ([place]  06/04/2019 Procedure   Colonoscopy per Dr. SAnson Fret Findings-the digital rectal exam revealed a 3 cm diameter firm rectal mass.  The mass was noncircumferential and located predominantly at the right bowel wall at the anorectal junction. A nonobstructing mass was found at the anus and in the rectum    06/04/2019 Initial Biopsy   Follow pathology: Large intestine, rectum biopsy: Invasive well to moderately differentiated squamous cell carcinoma.  No rectal mucosa present.  There is strong diffuse expression of P 16 immunostain.  CDX 2, p63 and mCEA immunostains are also used in the diagnostic work-up of the case.   06/04/2019 Initial Diagnosis   Anal cancer ([place]   06/21/2019 PET scan   IMPRESSION: 1. Anorectal primary. No hypermetabolic metastatic disease within the chest, abdomen, or pelvis. Perirectal nodes, at least 1 of which is new since 02/26/2018 CT, suspicious based on size and interval development. 2. Mild limitations secondary to beam hardening artifact from bilateral hip arthroplasty. 3.  Aortic Atherosclerosis (ICD10-I70.0).   06/28/2019 Cancer Staging   Staging form: Anus, AJCC 8th Edition - Clinical stage from 06/28/2019: Stage IIA (cT2, cN0, cM0) - Signed by BAlla Feeling NP on 06/28/2019  06/28/2019 -  Chemotherapy   concurrent chemoRT with Mitomycin and 5FU on week 1 and week 5 on starting 06/28/19.    06/28/2019 -  Radiation Therapy   concurrent chemoRT with Dr [name] starting on 06/28/19.      CURRENT THERAPY: concurrent chemoRT with 5FU/Mitomycin starting 06/28/19  INTERVAL HISTORY: Cynthia Velasquez returns for f/u as  scheduled. She continues RT. She tells me she brought her granddaughter to doctor last Friday 10/30 for cough and fever, ended up testing negative for flu and strep but positive for COVID19; did receive call until she was already inside [facility] waiting for appointments. The patient is asymptomatic. Denies fever, cough, chest pain, loss of sense of smell, dyspnea. She has decreased taste from chemo, unchanged. Patient notes her daughter tested "positive-negative or negative-positive." Both of whom live with her.   Otherwise, she is doing OK. Rectal pain is manageable, worse by the end of the week. She takes Dilaudid rarely, usually end of the week if at all. She eats once per day and has a loose BM an hour after. She drinks 4 large water bottles per day. Denies n/v. She had blood and mucus per rectum on 10/31 and 11/1. Noticed she is bruising easily.     MEDICAL HISTORY:  Past Medical History:  Diagnosis Date   Anxiety    Arthritis    Bilateral breast cancer ([place]) 10/21/2013   Breast cancer ([place])    Cancer ([place])    Depression    Gallstones    GERD (gastroesophageal reflux disease)    doesn't take any meds for this   Headache    h/o migraines       History of bladder infections    History of hiatal hernia    History of migraine    Hyperlipidemia    takes Simvastatin daily   Hypertension    takes Hyzaar   Joint pain    Joint swelling    Lumbar stenosis    Neuropathy 09/07/2013   Pneumonia    Pre-diabetes     SURGICAL HISTORY: Past Surgical History:  Procedure Laterality Date   ABDOMINAL HYSTERECTOMY     bone spur removed from left foot     CHOLECYSTECTOMY     COLONOSCOPY     double mastectomy      HERNIA REPAIR     umbilical   right knee arthroscopy     right shoulder arthroscopy     surgery for hiatal hernia     TONSILLECTOMY     TOTAL HIP ARTHROPLASTY Left 02/12/2013   TOTAL HIP ARTHROPLASTY Left 02/12/2013   Procedure: LEFT TOTAL HIP  ARTHROPLASTY;  Surgeon: Kerin Salen, MD;  Location: [facility];  Service: Orthopedics;  Laterality: Left;   TOTAL HIP ARTHROPLASTY Right 05/03/2013   Procedure: TOTAL HIP ARTHROPLASTY;  Surgeon: Kerin Salen, MD;  Location: [facility];  Service: Orthopedics;  Laterality: Right;   TOTAL KNEE ARTHROPLASTY Right 02/27/2015   Procedure: TOTAL KNEE ARTHROPLASTY;  Surgeon: Frederik Pear, MD;  Location: [facility];  Service: Orthopedics;  Laterality: Right;   TOTAL SHOULDER ARTHROPLASTY Right 09/12/2016   Procedure: RIGHT TOTAL SHOULDER ARTHROPLASTY;  Surgeon: Tania Ade, MD;  Location: [facility];  Service: Orthopedics;  Laterality: Right;  RIGHT TOTAL SHOULDER ARTHROPLASTY    I have reviewed the social history and family history with the patient and they are unchanged from previous note.  ALLERGIES:  is allergic to oxycodone-acetaminophen; codeine; and vicodin [hydrocodone-acetaminophen].  MEDICATIONS:  Current Outpatient Medications  Medication  Sig Dispense Refill   benzonatate (TESSALON) 100 MG capsule Take 1-2 capsules (100-200 mg total) by mouth 3 (three) times daily as needed for cough. 40 capsule 0   fluconazole (DIFLUCAN) 100 MG tablet Take one tablet po on 07/12/2019, then second dose on 07/14/2019 2 tablet 0   HYDROmorphone (DILAUDID) 2 MG tablet Take 0.5-1 tablets (1-2 mg total) by mouth every 4 (four) hours as needed for severe pain. 60 tablet 0   LINZESS 72 MCG capsule      losartan (COZAAR) 100 MG tablet TK 1 T PO D  6   nitroGLYCERIN (NITROSTAT) 0.4 MG SL tablet Place 0.4 mg under the tongue every 5 (five) minutes x 3 doses as needed for chest pain.  0   ondansetron (ZOFRAN ODT) 4 MG disintegrating tablet Take 1 tablet (4 mg total) by mouth every 8 (eight) hours as needed for nausea or vomiting. 6 tablet 0   PROAIR HFA 108 (90 Base) MCG/ACT inhaler 2 inhalations every 4-6 hours as needed. (Patient taking differently: Inhale 2 puffs into the lungs every 6 (six) hours as needed for wheezing  or shortness of breath. ) 18 g 1   promethazine (PHENERGAN) 25 MG tablet TK 1 T PO QID FOR 3 DAYS PRF VOM     No current facility-administered medications for this visit.     PHYSICAL EXAMINATION: ECOG PERFORMANCE STATUS: 1 - Symptomatic but completely ambulatory  Vitals:   07/19/19 1409  BP: 123/90  Pulse: 81  Resp: 18  Temp: 98.3 F (36.8 C)  SpO2: 98%   Filed Weights   07/19/19 1409  Weight: 227 lb 6.4 oz (103.1 kg)    GENERAL:alert, no distress and comfortable SKIN: no rash  EYES: sclera clear LUNGS: Respirations even and unlabored NEURO: alert & oriented x 3 with fluent speech Rectal exam: Perineal hyperpigmentation without erythema or skin breakdown Distanced exam for COVID-19 precautions  LABORATORY DATA:  I have reviewed the data as listed CBC Latest Ref Rng & Units 07/19/2019 07/12/2019 07/08/2019  WBC 4.0 - 10.5 K/uL 2.9(L) 2.6(L) 1.6(L)  Hemoglobin 12.0 - 15.0 g/dL 13.1 12.6 12.6  Hematocrit 36.0 - 46.0 % 41.7 39.6 39.5  Platelets 150 - 400 K/uL 85(L) 72(L) 128(L)     CMP Latest Ref Rng & Units 07/19/2019 07/12/2019 07/08/2019  Glucose 70 - 99 mg/dL 94 97 115(H)  BUN 8 - 23 mg/dL _0 Creatinine 0.44 - 1.00 mg/dL 1.11(H) 1.05(H) 1.03(H)  Sodium 135 - 145 mmol/L 142 144 144  Potassium 3.5 - 5.1 mmol/L 3.7 3.8 3.6  Chloride 98 - 111 mmol/L 108 110 109  CO2 22 - 32 mmol/L _1 Calcium 8.9 - 10.3 mg/dL 9.0 8.9 8.6(L)  Total Protein 6.5 - 8.1 g/dL 6.7 6.7 6.3(L)  Total Bilirubin 0.3 - 1.2 mg/dL 0.5 0.3 0.3  Alkaline Phos 38 - 126 U/L 89 85 86  AST 15 - 41 U/L _2 ALT 0 - 44 U/L _3 RADIOGRAPHIC STUDIES: I have personally reviewed the radiological images as listed and agreed with the findings in the report. No results found.   ASSESSMENT & PLAN: Cynthia Velasquez a 68 y.o.femalewith   1. Anal Squamous Cell Carcinoma, TxNxM0 -She was recently diagnosed in 05/2019.She presented with rectal bleeding and anal  pressure.Biopsy showeda 3 cm mass at the anorectal junction, path confirmedwell to moderately differentiated squamous cell carcinoma.10/5/20PET scan shows no metastasis.  Cynthia Velasquez candidate for definitive chemotherapy  and radiation, treatment goal is curative with with success rate of 80-90%. We discussed surgery is usually reserved for residual disease or local recurrence after concurrent chemoradiation. -She started concurrent chemoRT with Mitomycin and 5FU on week 1 and week 5 on 06/28/19. -She tolerated first week of ChemoRT moderately well with 2 episodes ofdizziness withfeeling of flushed skin, and nausea,which resolved spontaneously. She also had taste change and mouth soreness during week 2 - Rectal bleeding resolved, pain is minimal. She has easy BM now. Mucositis resolved. She has recovered well -She developed perineal itching, exam shows hyperpigmentation, no significant skin breakdown. Cynthia Velasquez, Cynthia Velasquez in RT called in diflucan.  -Cynthia Velasquez appears stable today, she had a recent episode of rectal bleeding and easy bruising, likely related to thrombocytopenia. PLT 85K. Otherwise doing well, pain remains mild and manageable  -CBC otherwise stable, ANC and Hgb normal. CMP stable, except Cr 1.11.  -I encouraged her to increase hydration, she is on BP meds, no other nephrotoxic meds.  -I encouraged her to take imodium in the morning and/or before meals PRN so she can eat more, she agrees -she continues RT  2. Close household contact to confirmed COVID19 positive case -her granddaughter developed fever and cough on 10/29, lives with the patient -Cynthia Velasquez brought her to doctor on 10/30, flu and strep negative -she learned of [place] positive on 11/2 while in [facility] lobby waiting for appointments, patient is asymptomatic -I referred her for Cynthia [place] testing at [facility] to be done today 11/2 -will f/u on pending test   2. H/o bilateral breast cancer 2007: right breast biopsy showed  atypical hyperplasia, s/p right lumpectomy in 06/2006 ER/PR+/HER2-, grade 1. Completed tamoxifen from 09/2007 - 09/2012 2008: left breast invasive ductal carcinoma, triple negative, grade 3 s/p lumpectomy in 07/2007 and eventual bilateral mastectomy; s/p adjuvant chemo AC-T and radiation   3. Genetics -patient is adopted, no known family history  -she reportedly had genetic testing which was negative. Her daughter also negative. I do not have the report.  4. HTN -on losartan -will monitor on chemo, she may require dose adjustments and or discontinuation of BP meds during therapy if she experiences hypotension  -BP123/90 today  5. Anemia -baseline labsfrom 10/12/20reveals normal hemoglobin.Iron studies in low-normal range, ferritin 15. -It wasrecommended shestart oral iron once daily if she can tolerate, and to take with orange juice/vitamin C for better absorption.  -Hgb normal, ferritin 106 on 10/19  6. Rectal pain, rectal bleeding -rectal bleeding resolved after she started treatment, now has easy BM 2-3 times daily.  -pain is mild now, does not like to take medication  -we discussed this indicates good response to treatment -Rectal pain is worse towards the end of her treatment week, takes Dilaudid sparingly -She did have 2 episodes of blood per rectum on 10/31, 11/1, likely related to thrombocytopenia  PLAN: -Labs reviewed -COVID19 test today at [facility], will f/u  -she will likely not continue RT until her test result comes back, I will discuss with Shona Simpson, PA  -plan for chemo next week unless she tests positive   All questions were answered. The patient knows to call the clinic with any problems, questions or concerns. No barriers to learning was detected.     Alla Feeling, NP 07/19/19

## 2019-07-19 ENCOUNTER — Ambulatory Visit
Admission: RE | Admit: 2019-07-19 | Discharge: 2019-07-19 | Disposition: A | Discharge status: Home / Self Care | Payer: PPO | Source: Ambulatory Visit | Attending: Radiation Oncology | Admitting: Radiation Oncology

## 2019-07-19 ENCOUNTER — Encounter: Payer: Self-pay | Admitting: Nurse Practitioner

## 2019-07-19 ENCOUNTER — Telehealth: Payer: PPO | Admitting: Physician Assistant

## 2019-07-19 ENCOUNTER — Inpatient Hospital Stay: Payer: PPO | Attending: Hematology | Admitting: Nurse Practitioner

## 2019-07-19 ENCOUNTER — Other Ambulatory Visit: Payer: Self-pay

## 2019-07-19 ENCOUNTER — Inpatient Hospital Stay: Payer: PPO

## 2019-07-19 ENCOUNTER — Other Ambulatory Visit: Payer: PPO

## 2019-07-19 VITALS — BP 123/90 | HR 81 | Temp 98.3°F | Resp 18 | Ht 65.0 in | Wt 227.4 lb

## 2019-07-19 DIAGNOSIS — Z20822 Contact with and (suspected) exposure to covid-19: Secondary | ICD-10-CM

## 2019-07-19 DIAGNOSIS — C50411 Malignant neoplasm of upper-outer quadrant of right female breast: Secondary | ICD-10-CM | POA: Diagnosis not present

## 2019-07-19 DIAGNOSIS — I10 Essential (primary) hypertension: Secondary | ICD-10-CM | POA: Diagnosis not present

## 2019-07-19 DIAGNOSIS — E876 Hypokalemia: Secondary | ICD-10-CM | POA: Insufficient documentation

## 2019-07-19 DIAGNOSIS — C210 Malignant neoplasm of anus, unspecified: Secondary | ICD-10-CM | POA: Insufficient documentation

## 2019-07-19 DIAGNOSIS — R102 Pelvic and perineal pain: Secondary | ICD-10-CM | POA: Insufficient documentation

## 2019-07-19 DIAGNOSIS — Z9221 Personal history of antineoplastic chemotherapy: Secondary | ICD-10-CM | POA: Insufficient documentation

## 2019-07-19 DIAGNOSIS — K6289 Other specified diseases of anus and rectum: Secondary | ICD-10-CM | POA: Insufficient documentation

## 2019-07-19 DIAGNOSIS — D649 Anemia, unspecified: Secondary | ICD-10-CM | POA: Diagnosis not present

## 2019-07-19 DIAGNOSIS — Z452 Encounter for adjustment and management of vascular access device: Secondary | ICD-10-CM | POA: Insufficient documentation

## 2019-07-19 DIAGNOSIS — C211 Malignant neoplasm of anal canal: Secondary | ICD-10-CM | POA: Diagnosis not present

## 2019-07-19 DIAGNOSIS — R1030 Lower abdominal pain, unspecified: Secondary | ICD-10-CM | POA: Insufficient documentation

## 2019-07-19 DIAGNOSIS — C50412 Malignant neoplasm of upper-outer quadrant of left female breast: Secondary | ICD-10-CM | POA: Diagnosis not present

## 2019-07-19 DIAGNOSIS — Z9013 Acquired absence of bilateral breasts and nipples: Secondary | ICD-10-CM | POA: Diagnosis not present

## 2019-07-19 DIAGNOSIS — R300 Dysuria: Secondary | ICD-10-CM | POA: Insufficient documentation

## 2019-07-19 DIAGNOSIS — Z5111 Encounter for antineoplastic chemotherapy: Secondary | ICD-10-CM | POA: Diagnosis not present

## 2019-07-19 DIAGNOSIS — R059 Cough, unspecified: Secondary | ICD-10-CM

## 2019-07-19 DIAGNOSIS — Z20828 Contact with and (suspected) exposure to other viral communicable diseases: Secondary | ICD-10-CM

## 2019-07-19 DIAGNOSIS — R05 Cough: Secondary | ICD-10-CM

## 2019-07-19 DIAGNOSIS — Z510 Encounter for antineoplastic radiation therapy: Secondary | ICD-10-CM | POA: Insufficient documentation

## 2019-07-19 DIAGNOSIS — Z209 Contact with and (suspected) exposure to unspecified communicable disease: Secondary | ICD-10-CM

## 2019-07-19 DIAGNOSIS — Z853 Personal history of malignant neoplasm of breast: Secondary | ICD-10-CM | POA: Diagnosis not present

## 2019-07-19 LAB — CBC WITH DIFFERENTIAL (CANCER CENTER ONLY)
Abs Immature Granulocytes: 0.01 10*3/uL (ref 0.00–0.07)
Basophils Absolute: 0 10*3/uL (ref 0.0–0.1)
Basophils Relative: 0 %
Eosinophils Absolute: 0.1 10*3/uL (ref 0.0–0.5)
Eosinophils Relative: 4 %
HCT: 41.7 % (ref 36.0–46.0)
Hemoglobin: 13.1 g/dL (ref 12.0–15.0)
Immature Granulocytes: 0 %
Lymphocytes Relative: 13 %
Lymphs Abs: 0.4 10*3/uL — ABNORMAL LOW (ref 0.7–4.0)
MCH: 26.4 pg (ref 26.0–34.0)
MCHC: 31.4 g/dL (ref 30.0–36.0)
MCV: 83.9 fL (ref 80.0–100.0)
Monocytes Absolute: 0.6 10*3/uL (ref 0.1–1.0)
Monocytes Relative: 20 %
Neutro Abs: 1.8 10*3/uL (ref 1.7–7.7)
Neutrophils Relative %: 63 %
Platelet Count: 85 10*3/uL — ABNORMAL LOW (ref 150–400)
RBC: 4.97 MIL/uL (ref 3.87–5.11)
RDW: 15.3 % (ref 11.5–15.5)
WBC Count: 2.9 10*3/uL — ABNORMAL LOW (ref 4.0–10.5)
nRBC: 0 % (ref 0.0–0.2)

## 2019-07-19 LAB — CMP (CANCER CENTER ONLY)
ALT: 27 U/L (ref 0–44)
AST: 23 U/L (ref 15–41)
Albumin: 3.5 g/dL (ref 3.5–5.0)
Alkaline Phosphatase: 89 U/L (ref 38–126)
Anion gap: 9 (ref 5–15)
BUN: 8 mg/dL (ref 8–23)
CO2: 25 mmol/L (ref 22–32)
Calcium: 9 mg/dL (ref 8.9–10.3)
Chloride: 108 mmol/L (ref 98–111)
Creatinine: 1.11 mg/dL — ABNORMAL HIGH (ref 0.44–1.00)
GFR, Est AFR Am: 60 mL/min — ABNORMAL LOW (ref >60)
GFR, Estimated: 51 mL/min — ABNORMAL LOW (ref >60)
Glucose, Bld: 94 mg/dL (ref 70–99)
Potassium: 3.7 mmol/L (ref 3.5–5.1)
Sodium: 142 mmol/L (ref 135–145)
Total Bilirubin: 0.5 mg/dL (ref 0.3–1.2)
Total Protein: 6.7 g/dL (ref 6.5–8.1)

## 2019-07-19 MED ORDER — BENZONATATE 100 MG PO CAPS: 100.0000 mg | ORAL_CAPSULE | Freq: Three times a day (TID) | ORAL | 0 refills | Status: DC | PRN

## 2019-07-19 NOTE — Progress Notes (Signed)
E-Visit for Corona Virus Screening   Your current symptoms could be consistent with the coronavirus.  Many health care providers can now test patients at their office but not all are.  [facility] has multiple testing sites - you do not need a lab order or an appointment. For information on our COVID testing locations and hours go to [email or web address]  Please quarantine yourself while awaiting your test results.  We are enrolling you in our [facility] for [place] . Daily you will receive a questionnaire within the [facility] website. Our COVID 19 response team willl be monitoriing your responses daily.    COVID-19 is a respiratory illness with symptoms that are similar to the flu. Symptoms are typically mild to moderate, but there have been cases of severe illness and death due to the virus. The following symptoms may appear 2-14 days after exposure: . Fever . Cough . Shortness of breath or difficulty breathing . Chills . Repeated shaking with chills . Muscle pain . Headache . Sore throat . New loss of taste or smell . Fatigue . Congestion or runny nose . Nausea or vomiting . Diarrhea  It is vitally important that if you feel that you have an infection such as this virus or any other virus that you stay home and away from places where you may spread it to others.  You should self-quarantine for 14 days if you have symptoms that could potentially be coronavirus or have been in close contact a with a person diagnosed with COVID-19 within the last 2 weeks. You should avoid contact with people age 68 and older.   You should wear a mask or cloth face covering over your nose and mouth if you must be around other people or animals, including pets (even at home). Try to stay at least 6 feet away from other people. This will protect the people around you.  You can use medication such as A prescription cough medication called Tessalon Perles 100 mg. You may  take 1-2 capsules every 8 hours as needed for cough  You may also take acetaminophen (Tylenol) as needed for fever.   Reduce your risk of any infection by using the same precautions used for avoiding the common cold or flu:  [name] Kitchen Wash your hands often with soap and warm water for at least 20 seconds.  If soap and water are not readily available, use an alcohol-based hand sanitizer with at least 60% alcohol.  . If coughing or sneezing, cover your mouth and nose by coughing or sneezing into the elbow areas of your shirt or coat, into a tissue or into your sleeve (not your hands). . Avoid shaking hands with others and consider head nods or verbal greetings only. . Avoid touching your eyes, nose, or mouth with unwashed hands.  . Avoid close contact with people who are sick. . Avoid places or events with large numbers of people in one location, like concerts or sporting events. . Carefully consider travel plans you have or are making. . If you are planning any travel outside or inside the [name], visit the CDC's Travelers' Health webpage for the latest health notices. . If you have some symptoms but not all symptoms, continue to monitor at home and seek medical attention if your symptoms worsen. . If you are having a medical emergency, call 911.  HOME CARE . Only take medications as instructed by your medical team. . Drink plenty of fluids and get plenty of rest. . A steam  or ultrasonic humidifier can help if you have congestion.   GET HELP RIGHT AWAY IF YOU HAVE EMERGENCY WARNING SIGNS** FOR COVID-19. If you or someone is showing any of these signs seek emergency medical care immediately. Call 911 or proceed to your closest emergency facility if: . You develop worsening high fever. . Trouble breathing . Bluish lips or face . Persistent pain or pressure in the chest . New confusion . Inability to wake or stay awake . You cough up blood. . Your symptoms become more severe  **This list is not all  possible symptoms. Contact your medical provider for any symptoms that are sever or concerning to you.   MAKE SURE YOU   Understand these instructions.  Will watch your condition.  Will get help right away if you are not doing well or get worse.  Your e-visit answers were reviewed by a board certified advanced clinical practitioner to complete your personal care plan.  Depending on the condition, your plan could have included both over the counter or prescription medications.  If there is a problem please reply once you have received a response from your provider.  Your safety is important to [name].  If you have drug allergies check your prescription carefully.    You can use MyChart to ask questions about today's visit, request a non-urgent call back, or ask for a work or school excuse for 24 hours related to this e-Visit. If it has been greater than 24 hours you will need to follow up with your provider, or enter a new e-Visit to address those concerns. You will get an e-mail in the next two days asking about your experience.  I hope that your e-visit has been valuable and will speed your recovery. Thank you for using e-visits.   Greater than 5 minutes, yet less than 10 minutes of time have been spent researching, coordinating and implementing care for this patient today.

## 2019-07-20 ENCOUNTER — Telehealth: Payer: Self-pay | Admitting: Radiation Oncology

## 2019-07-20 ENCOUNTER — Telehealth: Payer: Self-pay | Admitting: Nurse Practitioner

## 2019-07-20 ENCOUNTER — Telehealth: Payer: Self-pay

## 2019-07-20 ENCOUNTER — Encounter (INDEPENDENT_AMBULATORY_CARE_PROVIDER_SITE_OTHER): Payer: Self-pay

## 2019-07-20 ENCOUNTER — Ambulatory Visit: Payer: PPO

## 2019-07-20 LAB — NOVEL CORONAVIRUS, NAA: SARS-CoV-2, NAA: NOT DETECTED

## 2019-07-20 NOTE — Telephone Encounter (Signed)
Left VM for patient inquiring to see if she went for Covid testing yesterday.  Explained that she should quarantine until results are back and that radiation is on hold.  Asked for patient to call back so we would know sure she understands what to do

## 2019-07-20 NOTE — Telephone Encounter (Signed)
I called the patient and LM yesterday as well as today to check on her and to let her know that until we have a Covid result we will hold her treatment and that she would not need to come until we know her results and have a plan in place.

## 2019-07-20 NOTE — Telephone Encounter (Signed)
I wanted to follow up after talking to [name] (the patient) to find out more details.  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 The patient also has an adult daughter who lives with her (not child's mother).   The daughter had symptoms of a cold like sinus congestion (no fever), and got dehydrated and went to the ED on 10/24. She insisted on having a covid test because of our patient undergoing cancer treatments. The daughter's test was negative. The granddaughter started having symptoms of a cold, sinus congestion, and sore throat, and productive cough and high fever on 10/29. She was taken to the pediatrician and tested on 10/30, and was positive for covid. The patient has been around the granddaughter prior to the diagnosis of covid and since finding out the results on 11/2. The patient reports that her daughter is asymptomatic, her granddaughter has not had any other episodes of fever and only has a little congestion now.   Cynthia Velasquez is asymptomatic and was tested on 11/2, and her results are pending. She is in agreement to come tomorrow through the department entrance at 5 pm, will wait for the call from the treatment staff, and once we know her covid results, she is aware that if she tests negative, this may change her treatment back to a normal time slot.   She asked about chemotherapy as she's due for her next infusion on 11/9. I wasn't sure what med onc's protocol will be, but let her know we're working on involving them in the discussion.

## 2019-07-20 NOTE — Telephone Encounter (Signed)
-----   Message from Alla Feeling, NP sent at 07/20/2019  8:54 AM EST ----- Can you please call [name] and verify she got COVID test yesterday, and let her know to quarantine at home until we get results. Radiation has placed her treatment on hold.  Thanks, Regan Rakers

## 2019-07-20 NOTE — Telephone Encounter (Signed)
Patient left vm for nurse.  She confirmed that she did have a Covid test yesterday.

## 2019-07-20 NOTE — Telephone Encounter (Signed)
Thanks Cecille Rubin,  Can you please try again to reach her. It does not look like the COVID19 test is in progress. It's important she get tested so she can continue curative treatment if she is negative.  Thanks, lacie

## 2019-07-20 NOTE — Telephone Encounter (Signed)
No los per 11/2.

## 2019-07-21 ENCOUNTER — Telehealth: Payer: Self-pay | Admitting: Radiation Oncology

## 2019-07-21 ENCOUNTER — Encounter: Payer: Self-pay | Admitting: Nurse Practitioner

## 2019-07-21 ENCOUNTER — Ambulatory Visit
Admission: RE | Admit: 2019-07-21 | Discharge: 2019-07-21 | Disposition: A | Discharge status: Home / Self Care | Payer: PPO | Source: Ambulatory Visit | Attending: Radiation Oncology | Admitting: Radiation Oncology

## 2019-07-21 ENCOUNTER — Encounter (INDEPENDENT_AMBULATORY_CARE_PROVIDER_SITE_OTHER): Payer: Self-pay

## 2019-07-21 DIAGNOSIS — C50412 Malignant neoplasm of upper-outer quadrant of left female breast: Secondary | ICD-10-CM | POA: Diagnosis not present

## 2019-07-21 DIAGNOSIS — C211 Malignant neoplasm of anal canal: Secondary | ICD-10-CM | POA: Diagnosis not present

## 2019-07-21 NOTE — Telephone Encounter (Signed)
I left a message for the patient to let her know that her Covid testing was negative. We will plan for XRT as outlined yesterday.

## 2019-07-22 ENCOUNTER — Encounter (INDEPENDENT_AMBULATORY_CARE_PROVIDER_SITE_OTHER): Payer: Self-pay

## 2019-07-22 ENCOUNTER — Encounter: Payer: Self-pay | Admitting: Nurse Practitioner

## 2019-07-22 ENCOUNTER — Telehealth: Payer: Self-pay | Admitting: Radiation Oncology

## 2019-07-22 ENCOUNTER — Ambulatory Visit
Admission: RE | Admit: 2019-07-22 | Discharge: 2019-07-22 | Disposition: A | Discharge status: Home / Self Care | Payer: PPO | Source: Ambulatory Visit | Attending: Radiation Oncology | Admitting: Radiation Oncology

## 2019-07-22 DIAGNOSIS — C211 Malignant neoplasm of anal canal: Secondary | ICD-10-CM | POA: Diagnosis not present

## 2019-07-22 DIAGNOSIS — C50411 Malignant neoplasm of upper-outer quadrant of right female breast: Secondary | ICD-10-CM

## 2019-07-22 DIAGNOSIS — C50412 Malignant neoplasm of upper-outer quadrant of left female breast: Secondary | ICD-10-CM | POA: Diagnosis not present

## 2019-07-23 ENCOUNTER — Ambulatory Visit
Admission: RE | Admit: 2019-07-23 | Discharge: 2019-07-23 | Disposition: A | Discharge status: Home / Self Care | Payer: PPO | Source: Ambulatory Visit | Attending: Radiation Oncology | Admitting: Radiation Oncology

## 2019-07-23 ENCOUNTER — Other Ambulatory Visit: Payer: Self-pay

## 2019-07-23 DIAGNOSIS — C211 Malignant neoplasm of anal canal: Secondary | ICD-10-CM | POA: Diagnosis not present

## 2019-07-23 DIAGNOSIS — C50412 Malignant neoplasm of upper-outer quadrant of left female breast: Secondary | ICD-10-CM | POA: Diagnosis not present

## 2019-07-23 NOTE — Telephone Encounter (Signed)
The pt left me a message stating she was having terrible pain with urinating. I have placed orders for ua and culture and left her a message. [name] is also aware and will see her for her PUT visit.

## 2019-07-24 ENCOUNTER — Encounter (INDEPENDENT_AMBULATORY_CARE_PROVIDER_SITE_OTHER): Payer: Self-pay

## 2019-07-25 ENCOUNTER — Encounter (INDEPENDENT_AMBULATORY_CARE_PROVIDER_SITE_OTHER): Payer: Self-pay

## 2019-07-25 NOTE — Progress Notes (Signed)
[facility]   [phone] [phone]   Clinic Follow up Note   Patient Care Team: Kathyrn Lass, MD as PCP - General (Family Medicine) Wonda Horner, MD as Consulting Physician (Gastroenterology) Truitt Merle, MD as Consulting Physician (Hematology) Alla Feeling, NP as Nurse Practitioner (Nurse Practitioner) 07/26/2019  CHIEF COMPLAINT: F/u anal cancer   SUMMARY OF ONCOLOGIC HISTORY: Oncology History Overview Note  Breast cancer of upper-outer quadrant of right female breast, (ER/PR positive, her2/Neu negative)   Primary site: Breast (Right)   Staging method: AJCC 7th Edition   Clinical: Stage IA (T1a, N0, cM0) signed by [name], MD on 09/07/2013 11:14 AM   Pathologic: Stage IA (T1a, N0, cM0) signed by [name], MD on 09/07/2013 11:14 AM   Summary: Stage IA (T1a, N0, cM0)  Breast cancer of upper-outer quadrant of left female breast (ER/PR/Her2Neu negative)   Primary site: Breast (Left)   Staging method: AJCC 7th Edition   Clinical: Stage IIA (T2, N0, cM0) signed by [name], MD on 09/07/2013 11:35 AM   Pathologic: Stage IIA (T2, N0, cM0) signed by [name], MD on 09/07/2013 11:35 AM   Summary: Stage IIA (T2, N0, cM0)     Breast cancer of upper-outer quadrant of left female breast ([place])  07/10/2007 Procedure   US biopsy showed invasive ductal carcinoma 0.8cm, Nottingham grade 3   07/30/2007 Surgery   She had left breast lumpectomy and LN biopsy which showed high grade invasive ductal cancer Nottingham grade 3, 2.7 cm, ER/PR/Her2 neu negative   11/24/2007 Surgery   Patient elected for bilateral mastectomy   09/07/2008 - 03/08/2009 Chemotherapy   dates are approximate: she received adriamycin and cytoxan followed by Taxol   03/08/2009 - 05/08/2009 Radiation Therapy   dates are approximate, she received XRT    Breast cancer of upper-outer quadrant of right female breast ([place])  07/10/2006 Procedure   stereotactic biopsy showed  atypical hyperplasia   10/23/2006 Surgery   right lumpectomy showed invasive ductal ca (Nottingham grade 1)  46m and DCIS, ER/PR positive Her 2 negative   10/09/2007 - 10/08/2012 Chemotherapy   Patient was placed on Tamoxifen   03/24/2017 Imaging   No acute findings. No evidence of recurrent carcinoma or metastatic disease   Anal cancer ([place]  06/04/2019 Procedure   Colonoscopy per Dr. SAnson Fret Findings-the digital rectal exam revealed a 3 cm diameter firm rectal mass.  The mass was noncircumferential and located predominantly at the right bowel wall at the anorectal junction. A nonobstructing mass was found at the anus and in the rectum    06/04/2019 Initial Biopsy   Follow pathology: Large intestine, rectum biopsy: Invasive well to moderately differentiated squamous cell carcinoma.  No rectal mucosa present.  There is strong diffuse expression of P 16 immunostain.  CDX 2, p63 and mCEA immunostains are also used in the diagnostic work-up of the case.   06/04/2019 Initial Diagnosis   Anal cancer ([facility]   06/21/2019 PET scan   IMPRESSION: 1. Anorectal primary. No hypermetabolic metastatic disease within the chest, abdomen, or pelvis. Perirectal nodes, at least 1 of which is new since 02/26/2018 CT, suspicious based on size and interval development. 2. Mild limitations secondary to beam hardening artifact from bilateral hip arthroplasty. 3.  Aortic Atherosclerosis (ICD10-I70.0).   06/28/2019 Cancer Staging   Staging form: Anus, AJCC 8th Edition - Clinical stage from 06/28/2019: Stage IIA (cT2, cN0, cM0) - Signed by BAlla Feeling NP on 06/28/2019   06/28/2019 -  Chemotherapy   concurrent chemoRT with Mitomycin and 5FU on week 1 and week 5 on starting 06/28/19.    06/28/2019 -  Radiation Therapy   concurrent chemoRT with Dr [name] starting on 06/28/19.      CURRENT THERAPY: concurrent chemoRT with 5FU/Mitomycin starting 06/28/19  INTERVAL HISTORY: Ms. Buschman returns for f/u and  treatment as scheduled. She continues RT. She is tired but functional. She is standing when I enter the room. Rates rectal discomfort from dryness and irritation 9/10 most of the time. Hurts to sit. She takes dilaudid 1 tab once per day which helps but doesn't take it as much as she should. Benadryl helps perineal itching. She has one soft to formed BM after largest meal of the day, eats small amounts to limit GI output otherwise. Denies n/v or mucositis. has some bleeding at the gumline when she brushes teeth with soft bristle brush, denies epistaxis or GI bleeding. She has difficulty starting urine stream with little output, doesn't think she can give a sample today. Denies fever, chills, cough, chest pain, dyspnea, leg swelling. She had tingling in fingertips of right hand when PICC line was inserted, otherwise no neuropathy. She has some right low back/buttock pain from existing sciatica.    MEDICAL HISTORY:  Past Medical History:  Diagnosis Date   Anxiety    Arthritis    Bilateral breast cancer ([place]) 10/21/2013   Breast cancer ([place])    Cancer ([place])    Depression    Gallstones    GERD (gastroesophageal reflux disease)    doesn't take any meds for this   Headache    h/o migraines       History of bladder infections    History of hiatal hernia    History of migraine    Hyperlipidemia    takes Simvastatin daily   Hypertension    takes Hyzaar   Joint pain    Joint swelling    Lumbar stenosis    Neuropathy 09/07/2013   Pneumonia    Pre-diabetes     SURGICAL HISTORY: Past Surgical History:  Procedure Laterality Date   ABDOMINAL HYSTERECTOMY     bone spur removed from left foot     CHOLECYSTECTOMY     COLONOSCOPY     double mastectomy      HERNIA REPAIR     umbilical   right knee arthroscopy     right shoulder arthroscopy     surgery for hiatal hernia     TONSILLECTOMY     TOTAL HIP ARTHROPLASTY Left 02/12/2013   TOTAL HIP ARTHROPLASTY Left  02/12/2013   Procedure: LEFT TOTAL HIP ARTHROPLASTY;  Surgeon: Kerin Salen, MD;  Location: [facility];  Service: Orthopedics;  Laterality: Left;   TOTAL HIP ARTHROPLASTY Right 05/03/2013   Procedure: TOTAL HIP ARTHROPLASTY;  Surgeon: Kerin Salen, MD;  Location: [facility];  Service: Orthopedics;  Laterality: Right;   TOTAL KNEE ARTHROPLASTY Right 02/27/2015   Procedure: TOTAL KNEE ARTHROPLASTY;  Surgeon: Frederik Pear, MD;  Location: [facility];  Service: Orthopedics;  Laterality: Right;   TOTAL SHOULDER ARTHROPLASTY Right 09/12/2016   Procedure: RIGHT TOTAL SHOULDER ARTHROPLASTY;  Surgeon: Tania Ade, MD;  Location: [facility];  Service: Orthopedics;  Laterality: Right;  RIGHT TOTAL SHOULDER ARTHROPLASTY    I have reviewed the social history and family history with the patient and they are unchanged from previous note.  ALLERGIES:  is allergic to oxycodone-acetaminophen; codeine; and vicodin [hydrocodone-acetaminophen].  MEDICATIONS:  Current Outpatient Medications  Medication  Sig Dispense Refill   benzonatate (TESSALON) 100 MG capsule Take 1-2 capsules (100-200 mg total) by mouth 3 (three) times daily as needed for cough. 40 capsule 0   HYDROmorphone (DILAUDID) 2 MG tablet Take 0.5-1 tablets (1-2 mg total) by mouth every 4 (four) hours as needed for severe pain. 60 tablet 0   LINZESS 72 MCG capsule      losartan (COZAAR) 100 MG tablet TK 1 T PO D  6   ondansetron (ZOFRAN ODT) 4 MG disintegrating tablet Take 1 tablet (4 mg total) by mouth every 8 (eight) hours as needed for nausea or vomiting. 6 tablet 0   PROAIR HFA 108 (90 Base) MCG/ACT inhaler 2 inhalations every 4-6 hours as needed. (Patient taking differently: Inhale 2 puffs into the lungs every 6 (six) hours as needed for wheezing or shortness of breath. ) 18 g 1   fluconazole (DIFLUCAN) 100 MG tablet Take one tablet po on 07/12/2019, then second dose on 07/14/2019 (Patient not taking: Reported on 07/26/2019) 2 tablet 0   nitroGLYCERIN  (NITROSTAT) 0.4 MG SL tablet Place 0.4 mg under the tongue every 5 (five) minutes x 3 doses as needed for chest pain.  0   promethazine (PHENERGAN) 25 MG tablet TK 1 T PO QID FOR 3 DAYS PRF VOM     No current facility-administered medications for this visit.    Facility-Administered Medications Ordered in Other Visits  Medication Dose Route Frequency Provider Last Rate Last Dose   fluorouracil (ADRUCIL) 8,900 mg in sodium chloride 0.9 % 72 mL chemo infusion  1,000 mg/m2/day (Treatment Plan Recorded) Intravenous 4 days Truitt Merle, MD       heparin lock flush 100 unit/mL  250 Units Intracatheter Once PRN Truitt Merle, MD       lidocaine (XYLOCAINE) 1 % (with pres) injection            mitoMYcin (MUTAMYCIN) chemo injection 16 mg  7.3 mg/m2 (Treatment Plan Recorded) Intravenous Once Truitt Merle, MD       sodium chloride flush (NS) 0.9 % injection 3 mL  3 mL Intracatheter PRN Truitt Merle, MD        PHYSICAL EXAMINATION: ECOG PERFORMANCE STATUS: 1 - Symptomatic but completely ambulatory  Vitals:   07/26/19 0937  Pulse: 76  Resp: 18  Temp: 98 F (36.7 C)  SpO2: 99%   Filed Weights   07/26/19 0937  Weight: 226 lb 9.6 oz (102.8 kg)    GENERAL:alert, no distress and comfortable SKIN:no rash  EYES: sclera clear OROPHARYNX: no thrush or ulcers  LUNGS: clear with normal breathing effort HEART: regular rate & rhythm, no lower extremity edema ABDOMEN:abdomen soft, non-tender and normal bowel sounds RECTAL; external exam reveals mild to moderate hyperpigmentation with minimal moisture. No erythema, ulcers, or breakdown NEURO: alert & oriented x 3 with fluent speech, no focal motor/sensory deficits RUE PICC in place, no erythema or swelling  LABORATORY DATA:  I have reviewed the data as listed CBC Latest Ref Rng & Units 07/26/2019 07/19/2019 07/12/2019  WBC 4.0 - 10.5 K/uL 2.5(L) 2.9(L) 2.6(L)  Hemoglobin 12.0 - 15.0 g/dL 12.8 13.1 12.6  Hematocrit 36.0 - 46.0 % 39.5 41.7 39.6  Platelets  150 - 400 K/uL 136(L) 85(L) 72(L)     CMP Latest Ref Rng & Units 07/26/2019 07/19/2019 07/12/2019  Glucose 70 - 99 mg/dL 106(H) 94 97  BUN 8 - 23 mg/dL _0 Creatinine 0.44 - 1.00 mg/dL 1.02(H) 1.11(H) 1.05(H)  Sodium 135 - 145  mmol/L 144 142 144  Potassium 3.5 - 5.1 mmol/L 4.3 3.7 3.8  Chloride 98 - 111 mmol/L 108 108 110  CO2 22 - 32 mmol/L _0 Calcium 8.9 - 10.3 mg/dL 8.7(L) 9.0 8.9  Total Protein 6.5 - 8.1 g/dL 6.8 6.7 6.7  Total Bilirubin 0.3 - 1.2 mg/dL 0.4 0.5 0.3  Alkaline Phos 38 - 126 U/L 85 89 85  AST 15 - 41 U/L _1 ALT 0 - 44 U/L _2 RADIOGRAPHIC STUDIES: I have personally reviewed the radiological images as listed and agreed with the findings in the report. Ir Picc Placement Right >5 Yrs Inc Img Guide  Result Date: 07/26/2019 INDICATION: Patient with history anal cancer, poor venous access; central venous access requested for chemotherapy EXAM: ULTRASOUND AND FLUOROSCOPIC GUIDED RIGHT UPPER EXTREMITY PICC LINE INSERTION MEDICATIONS: 1% lidocaine to skin and subcutaneous tissue ANESTHESIA/SEDATION: None FLUOROSCOPY TIME:  Fluoroscopy Time:  42 seconds (8 mGy). COMPLICATIONS: None immediate. TECHNIQUE: The procedure, risks, benefits, and alternatives were explained to the patient and informed written consent was obtained. A timeout was performed prior to the initiation of the procedure. The right upper extremity was prepped with chlorhexidine in a sterile fashion, and a sterile drape was applied covering the operative field. Maximum barrier sterile technique with sterile gowns and gloves were used for the procedure. A timeout was performed prior to the initiation of the procedure. Local anesthesia was provided with 1% lidocaine. Under direct ultrasound guidance, the right brachial vein was accessed with a micropuncture kit after the overlying soft tissues were anesthetized with 1% lidocaine. An ultrasound image was saved for documentation purposes. A  guidewire was advanced to the level of the superior caval-atrial junction for measurement purposes and the PICC line was cut to length. A peel-away sheath was placed and a 38 cm, 5 [place], single lumen was inserted to level of the superior caval-atrial junction. A post procedure spot fluoroscopic was obtained. The catheter easily aspirated and flushed and was sutured in place. A dressing was placed. The patient tolerated the procedure well without immediate post procedural complication. FINDINGS: After catheter placement, the tip lies within the superior cavoatrial junction the catheter aspirates and flushes normally and is ready for immediate use. IMPRESSION: Successful ultrasound and fluoroscopic guided placement of a right brachial vein approach, 38 cm, 5 French, single lumen PICC with tip at the superior caval-atrial junction. The PICC line is ready for immediate use. Read by: Rowe Robert, PA-C Electronically Signed   By: Aletta Edouard M.D.   On: 07/26/2019 09:34     ASSESSMENT & PLAN: Cynthia Velasquez a 68 y.o.femalewith   1. Anal Squamous Cell Carcinoma, cT2cN0M0 -Diagnosed in 05/2019.She presented with rectal bleeding and anal pressure.Biopsy showeda 3 cm mass at the anorectal junction, path confirmedwell to moderately differentiated squamous cell carcinoma.10/5/20PET scan shows no metastasis.  Dellis Anes candidate for definitive chemotherapy and radiation,treatment goal is curative withwith success rate of 80-90%.We discussed surgery is usually reserved for residual disease or local recurrence after concurrent chemoradiation. -She started concurrent chemoRT with Mitomycin and 5FU on week 1 and week 5 on 06/28/19. -She tolerated first week of ChemoRT moderately well with 2 episodes ofdizziness withfeeling of flushed skin, and nausea,which resolved spontaneously. She also had taste change and mouth sorenessduring week 2 -Rectal bleeding resolved and pain improved after cycle 1.  Mucositis resolved. She recovered well -She developed perineal itching, exam shows hyperpigmentation, no significant skin breakdown. Bryson Ha, [place]  in RT called in diflucan and completed the course.  -she developed easy bruising and mild rectal bleeding and bleeding at the gumline during week 4, likely due to thrombocytopenia, no epistaxis or major bleeding -Ms. Marcinek appears stable today, she has worsening rectal pain, skin exam shows hyperpigmentation but no open skin. She will continue dilaudid PRN. BMs controlled. VS and weight stable -Labs reviewed, PLT 136K. Due to nadir plt count 72K and mild bleeding, will dose reduce mitomycin to 7.19m/m2 and continue full dose 5FU with day 28 today. She will return for pump and PICC d/c on 11/13 -lab and f/u next week   2. Rectal pain, rectal bleeding -rectal bleeding resolved after she started treatment, now has easy BM 2-3 times daily.  -pain is mild now, does not like to take medication  -we discussed this indicates good response to treatment -Rectal pain is worse towards the end of her treatment week, takes Dilaudid sparingly -She did have 2 episodes of blood per rectum on 10/31, 11/1, likely related to thrombocytopenia, nadir plt count 72K on day 15 -I encouraged her to continue main meds as prescribed, she can take more frequently as needed   3. Close household contact to confirmed COVID19 positive case -her granddaughter developed fever and cough on 10/29, lives with the patient -Ms. SCoutsbrought her to doctor on 10/30, flu and strep negative -she [place] of COVID19 [place] [facility] for [place], patient is asymptomatic -her COVID19 test on 11/2 was negative -her granddaughter has been cleared to return to school by the public health department per patient, she is no longer symptomatic.  -patient remains asymptomatic   4. H/o bilateral breast cancer 2007: right breast biopsy showed atypical hyperplasia, s/p  right lumpectomy in 06/2006 ER/PR+/HER2-, grade 1. Completed tamoxifen from 09/2007 - 09/2012 2008: left breast invasive ductal carcinoma, triple negative, grade 3 s/p lumpectomy in 07/2007 and eventual bilateral mastectomy; s/p adjuvant chemo AC-T and radiation   5. Genetics -patient is adopted, no known family history  -she reportedly had genetic testing which was negative. Her daughter also negative. I do not have the report.  6. HTN -on losartan -will monitor on chemo, she may require dose adjustments and or discontinuation of BP meds during therapy if she experiences hypotension  -BP126/85 today, stable  7. Anemia -baseline labsfrom 10/12/20reveals normal hemoglobin.Iron studies in low-normal range, ferritin 15. -It wasrecommended shestart oral iron once daily if she can tolerate, and to take with orange juice/vitamin C for better absorption. -Hgb normal, ferritin 106 on 10/19 -continue monitoring   PLAN: -Labs reviewed -Proceed with day 28 5FU/mitomycin today, dose-reduce mitomycin to 7.3 mg/m2 for thrombocytopenia  -Pump and PICC d/c on 11/13 -Continue pain regimen for rectal pain -lab and f/u next week  -Continue COVID19 precautions   All questions were answered. The patient knows to call the clinic with any problems, questions or concerns. No barriers to learning was detected. I spent 20 minutes counseling the patient face to face. The total time spent in the appointment was 25 minutes and more than 50% was on counseling and review of test results     LAlla Feeling NP 07/26/19

## 2019-07-26 ENCOUNTER — Inpatient Hospital Stay (HOSPITAL_BASED_OUTPATIENT_CLINIC_OR_DEPARTMENT_OTHER): Payer: PPO | Admitting: Nurse Practitioner

## 2019-07-26 ENCOUNTER — Inpatient Hospital Stay: Payer: PPO

## 2019-07-26 ENCOUNTER — Other Ambulatory Visit: Payer: Self-pay | Admitting: Nurse Practitioner

## 2019-07-26 ENCOUNTER — Ambulatory Visit
Admission: RE | Admit: 2019-07-26 | Discharge: 2019-07-26 | Disposition: A | Discharge status: Home / Self Care | Payer: PPO | Source: Ambulatory Visit | Attending: Radiation Oncology | Admitting: Radiation Oncology

## 2019-07-26 ENCOUNTER — Encounter (INDEPENDENT_AMBULATORY_CARE_PROVIDER_SITE_OTHER): Payer: Self-pay

## 2019-07-26 ENCOUNTER — Encounter: Payer: Self-pay | Admitting: Nurse Practitioner

## 2019-07-26 ENCOUNTER — Ambulatory Visit (HOSPITAL_COMMUNITY)
Admission: RE | Admit: 2019-07-26 | Discharge: 2019-07-26 | Disposition: A | Discharge status: Home / Self Care | Payer: PPO | Source: Ambulatory Visit | Attending: Nurse Practitioner | Admitting: Nurse Practitioner

## 2019-07-26 ENCOUNTER — Other Ambulatory Visit: Payer: Self-pay

## 2019-07-26 VITALS — HR 76 | Temp 98.0°F | Resp 18 | Ht 65.0 in | Wt 226.6 lb

## 2019-07-26 VITALS — BP 112/85

## 2019-07-26 DIAGNOSIS — Z5111 Encounter for antineoplastic chemotherapy: Secondary | ICD-10-CM | POA: Diagnosis not present

## 2019-07-26 DIAGNOSIS — C210 Malignant neoplasm of anus, unspecified: Secondary | ICD-10-CM | POA: Diagnosis not present

## 2019-07-26 DIAGNOSIS — C211 Malignant neoplasm of anal canal: Secondary | ICD-10-CM | POA: Diagnosis not present

## 2019-07-26 DIAGNOSIS — C50412 Malignant neoplasm of upper-outer quadrant of left female breast: Secondary | ICD-10-CM | POA: Diagnosis not present

## 2019-07-26 LAB — CBC WITH DIFFERENTIAL (CANCER CENTER ONLY)
Abs Immature Granulocytes: 0 10*3/uL (ref 0.00–0.07)
Basophils Absolute: 0 10*3/uL (ref 0.0–0.1)
Basophils Relative: 0 %
Eosinophils Absolute: 0.2 10*3/uL (ref 0.0–0.5)
Eosinophils Relative: 8 %
HCT: 39.5 % (ref 36.0–46.0)
Hemoglobin: 12.8 g/dL (ref 12.0–15.0)
Immature Granulocytes: 0 %
Lymphocytes Relative: 11 %
Lymphs Abs: 0.3 10*3/uL — ABNORMAL LOW (ref 0.7–4.0)
MCH: 26.7 pg (ref 26.0–34.0)
MCHC: 32.4 g/dL (ref 30.0–36.0)
MCV: 82.3 fL (ref 80.0–100.0)
Monocytes Absolute: 0.3 10*3/uL (ref 0.1–1.0)
Monocytes Relative: 14 %
Neutro Abs: 1.7 10*3/uL (ref 1.7–7.7)
Neutrophils Relative %: 67 %
Platelet Count: 136 10*3/uL — ABNORMAL LOW (ref 150–400)
RBC: 4.8 MIL/uL (ref 3.87–5.11)
RDW: 15.9 % — ABNORMAL HIGH (ref 11.5–15.5)
WBC Count: 2.5 10*3/uL — ABNORMAL LOW (ref 4.0–10.5)
nRBC: 0 % (ref 0.0–0.2)

## 2019-07-26 LAB — CMP (CANCER CENTER ONLY)
ALT: 25 U/L (ref 0–44)
AST: 24 U/L (ref 15–41)
Albumin: 3.5 g/dL (ref 3.5–5.0)
Alkaline Phosphatase: 85 U/L (ref 38–126)
Anion gap: 9 (ref 5–15)
BUN: 8 mg/dL (ref 8–23)
CO2: 27 mmol/L (ref 22–32)
Calcium: 8.7 mg/dL — ABNORMAL LOW (ref 8.9–10.3)
Chloride: 108 mmol/L (ref 98–111)
Creatinine: 1.02 mg/dL — ABNORMAL HIGH (ref 0.44–1.00)
GFR, Est AFR Am: >60 mL/min (ref >60)
GFR, Estimated: 57 mL/min — ABNORMAL LOW (ref >60)
Glucose, Bld: 106 mg/dL — ABNORMAL HIGH (ref 70–99)
Potassium: 4.3 mmol/L (ref 3.5–5.1)
Sodium: 144 mmol/L (ref 135–145)
Total Bilirubin: 0.4 mg/dL (ref 0.3–1.2)
Total Protein: 6.8 g/dL (ref 6.5–8.1)

## 2019-07-26 MED ORDER — PROCHLORPERAZINE MALEATE 10 MG PO TABS
10.0000 mg | ORAL_TABLET | Freq: Once | ORAL | Status: AC
  Administered 2019-07-26: 10 mg via ORAL

## 2019-07-26 MED ORDER — SODIUM CHLORIDE 0.9 % IV SOLN
Freq: Once | INTRAVENOUS | Status: AC
  Administered 2019-07-26: 11:00:00 via INTRAVENOUS
  Filled 2019-07-26: qty 250

## 2019-07-26 MED ORDER — SODIUM CHLORIDE 0.9 % IV SOLN
1000.0000 mg/m2/d | INTRAVENOUS | Status: DC
  Administered 2019-07-26: 8900 mg via INTRAVENOUS
  Filled 2019-07-26: qty 178

## 2019-07-26 MED ORDER — HEPARIN SOD (PORK) LOCK FLUSH 100 UNIT/ML IV SOLN
250.0000 [IU] | Freq: Once | INTRAVENOUS | Status: DC | PRN
  Filled 2019-07-26: qty 5

## 2019-07-26 MED ORDER — PROCHLORPERAZINE MALEATE 10 MG PO TABS
ORAL_TABLET | ORAL | Status: AC
  Filled 2019-07-26: qty 1

## 2019-07-26 MED ORDER — LIDOCAINE HCL 1 % IJ SOLN
INTRAMUSCULAR | Status: AC
  Filled 2019-07-26: qty 20

## 2019-07-26 MED ORDER — SODIUM CHLORIDE 0.9% FLUSH
3.0000 mL | INTRAVENOUS | Status: DC | PRN
  Filled 2019-07-26: qty 10

## 2019-07-26 MED ORDER — MITOMYCIN CHEMO IV INJECTION 20 MG
7.3000 mg/m2 | Freq: Once | INTRAVENOUS | Status: AC
  Administered 2019-07-26: 16 mg via INTRAVENOUS
  Filled 2019-07-26: qty 32

## 2019-07-26 NOTE — Patient Instructions (Signed)
[facility] Discharge Instructions for Patients Receiving Chemotherapy  Today you received the following chemotherapy agents:  Mitomycin, Fluorouracil  To help prevent nausea and vomiting after your treatment, we encourage you to take your nausea medication as prescribed.  If you develop nausea and vomiting that is not controlled by your nausea medication, call the clinic.   BELOW ARE SYMPTOMS THAT SHOULD BE REPORTED IMMEDIATELY:  *FEVER GREATER THAN 100.5 F  *CHILLS WITH OR WITHOUT FEVER  NAUSEA AND VOMITING THAT IS NOT CONTROLLED WITH YOUR NAUSEA MEDICATION  *UNUSUAL SHORTNESS OF BREATH  *UNUSUAL BRUISING OR BLEEDING  TENDERNESS IN MOUTH AND THROAT WITH OR WITHOUT PRESENCE OF ULCERS  *URINARY PROBLEMS  *BOWEL PROBLEMS  UNUSUAL RASH Items with * indicate a potential emergency and should be followed up as soon as possible.  Feel free to call the clinic should you have any questions or concerns. The clinic phone number is [phone].  Please show the [facility] at check-in to the Emergency Department and triage nurse.

## 2019-07-26 NOTE — Progress Notes (Unsigned)
Patient does not have port or PICC at this time.  Patient sent back to lab for peripheral draw.

## 2019-07-26 NOTE — Procedures (Signed)
Right SL brachial vein PICC placed. Length 38 cm. Tip SVC/RA junction. Meds used- 1% lidocaine to skin/SQ tissue. No immediate complications. Ok to use. EBL< 2 cc.

## 2019-07-27 ENCOUNTER — Telehealth: Payer: Self-pay | Admitting: Nurse Practitioner

## 2019-07-27 ENCOUNTER — Ambulatory Visit
Admission: RE | Admit: 2019-07-27 | Discharge: 2019-07-27 | Disposition: A | Discharge status: Home / Self Care | Payer: PPO | Source: Ambulatory Visit | Attending: Radiation Oncology | Admitting: Radiation Oncology

## 2019-07-27 ENCOUNTER — Other Ambulatory Visit: Payer: Self-pay

## 2019-07-27 DIAGNOSIS — C211 Malignant neoplasm of anal canal: Secondary | ICD-10-CM | POA: Diagnosis not present

## 2019-07-27 DIAGNOSIS — C50412 Malignant neoplasm of upper-outer quadrant of left female breast: Secondary | ICD-10-CM | POA: Diagnosis not present

## 2019-07-27 NOTE — Progress Notes (Signed)
Phoned to treatment machine 4 to assess patient's PICC line. Patient reports tenderness at PICC site. Noted large dark bruise surrounding PICC line insertion site without redness or warmth. Patient reports PICC line was placed yesterday. Pump infusing without alarm. No blood noted in PICC line. Patient states, "I had a PICC line before and it didn't hurt like this one." Explained that I saw no visible signs for concern. Explained the tenderness should subside within the next 2-3 days. Assured patient this RN would contact both [name] and NP, Regan Rakers reference her concerns. Patient reports reaching out via phone without response. Patient agreed with plan.

## 2019-07-27 NOTE — Telephone Encounter (Signed)
Scheduled appt per 11/9 los.  Spoke with pt and she is aware of the appt date and time,.

## 2019-07-28 ENCOUNTER — Ambulatory Visit
Admission: RE | Admit: 2019-07-28 | Discharge: 2019-07-28 | Disposition: A | Discharge status: Home / Self Care | Payer: PPO | Source: Ambulatory Visit | Attending: Radiation Oncology | Admitting: Radiation Oncology

## 2019-07-28 ENCOUNTER — Telehealth: Payer: Self-pay | Admitting: *Deleted

## 2019-07-28 ENCOUNTER — Encounter (INDEPENDENT_AMBULATORY_CARE_PROVIDER_SITE_OTHER): Payer: Self-pay

## 2019-07-28 DIAGNOSIS — C211 Malignant neoplasm of anal canal: Secondary | ICD-10-CM | POA: Diagnosis not present

## 2019-07-28 DIAGNOSIS — C50412 Malignant neoplasm of upper-outer quadrant of left female breast: Secondary | ICD-10-CM | POA: Diagnosis not present

## 2019-07-28 NOTE — Telephone Encounter (Signed)
Per Cira Rue, NP, called pt and checked to see how her arm was after PICC placement. Pt stated, " There's bruising at the site of the PICC that seemed to have gotten larger. Swelling has gone down, but I still think someone should look at it." Pt will come in today for RT appt and before appt, will have PICC checked. Pt verbalized understanding.

## 2019-07-28 NOTE — Telephone Encounter (Signed)
-----   Message from Alla Feeling, NP sent at 07/27/2019  5:14 PM EST ----- Regarding: RE: Reassurance Thanks Sam. I saw her yesterday, and she noted they "hit a nerve" during placement yesterday. It looked good to me as well.   Tycen Dockter or [name],  Could one of you call her Wednesday to check in on her arm, she will be here tomorrow for RT and we can eye ball it if necessary.  Thanks, Regan Rakers  ----- Message ----- From: Heywood Footman, RN Sent: 07/27/2019   5:09 PM EST To: Alla Feeling, NP, Truitt Merle, MD Subject: Reassurance                                    Phoned to treatment machine 4 to assess patient's PICC line. Patient reports tenderness at PICC site. Noted large dark bruise surrounding PICC line insertion site without redness or warmth. Patient reports PICC line was placed yesterday. Pump infusing without alarm. No blood noted in PICC line. Patient states, "I had a PICC line before and it didn't hurt like this one." Explained that I saw no visible signs for concern. Explained the tenderness should subside within the next 2-3 days. Assured patient this RN would contact both [name] and NP, Regan Rakers reference her concerns. Patient reports reaching out via phone without response. Patient agreed with plan.  Lacie/[name].   I wasn't particularly concerned about this ladies PICC line. I feel a call from your nurse with some reassurance will suffice. The comment that she had had one before that "didn't hurt like this one" inspired me to notify you both.   Inocente Salles, [place]

## 2019-07-29 ENCOUNTER — Other Ambulatory Visit: Payer: Self-pay

## 2019-07-29 ENCOUNTER — Ambulatory Visit
Admission: RE | Admit: 2019-07-29 | Discharge: 2019-07-29 | Disposition: A | Discharge status: Home / Self Care | Payer: PPO | Source: Ambulatory Visit | Attending: Radiation Oncology | Admitting: Radiation Oncology

## 2019-07-29 ENCOUNTER — Encounter (INDEPENDENT_AMBULATORY_CARE_PROVIDER_SITE_OTHER): Payer: Self-pay

## 2019-07-29 DIAGNOSIS — C50412 Malignant neoplasm of upper-outer quadrant of left female breast: Secondary | ICD-10-CM | POA: Diagnosis not present

## 2019-07-29 DIAGNOSIS — C211 Malignant neoplasm of anal canal: Secondary | ICD-10-CM | POA: Diagnosis not present

## 2019-07-30 ENCOUNTER — Other Ambulatory Visit: Payer: Self-pay

## 2019-07-30 ENCOUNTER — Inpatient Hospital Stay: Payer: PPO

## 2019-07-30 ENCOUNTER — Encounter (INDEPENDENT_AMBULATORY_CARE_PROVIDER_SITE_OTHER): Payer: Self-pay

## 2019-07-30 ENCOUNTER — Telehealth: Payer: Self-pay | Admitting: *Deleted

## 2019-07-30 ENCOUNTER — Ambulatory Visit
Admission: RE | Admit: 2019-07-30 | Discharge: 2019-07-30 | Disposition: A | Discharge status: Home / Self Care | Payer: PPO | Source: Ambulatory Visit | Attending: Radiation Oncology | Admitting: Radiation Oncology

## 2019-07-30 VITALS — BP 132/79 | HR 75 | Temp 98.3°F | Resp 20

## 2019-07-30 DIAGNOSIS — I10 Essential (primary) hypertension: Secondary | ICD-10-CM | POA: Diagnosis not present

## 2019-07-30 DIAGNOSIS — C50412 Malignant neoplasm of upper-outer quadrant of left female breast: Secondary | ICD-10-CM | POA: Diagnosis not present

## 2019-07-30 DIAGNOSIS — Z95828 Presence of other vascular implants and grafts: Secondary | ICD-10-CM

## 2019-07-30 DIAGNOSIS — M150 Primary generalized (osteo)arthritis: Secondary | ICD-10-CM | POA: Diagnosis not present

## 2019-07-30 DIAGNOSIS — E7800 Pure hypercholesterolemia, unspecified: Secondary | ICD-10-CM | POA: Diagnosis not present

## 2019-07-30 DIAGNOSIS — C211 Malignant neoplasm of anal canal: Secondary | ICD-10-CM | POA: Diagnosis not present

## 2019-07-30 DIAGNOSIS — C210 Malignant neoplasm of anus, unspecified: Secondary | ICD-10-CM

## 2019-07-30 DIAGNOSIS — Z853 Personal history of malignant neoplasm of breast: Secondary | ICD-10-CM | POA: Diagnosis not present

## 2019-07-30 DIAGNOSIS — F329 Major depressive disorder, single episode, unspecified: Secondary | ICD-10-CM | POA: Diagnosis not present

## 2019-07-30 DIAGNOSIS — F331 Major depressive disorder, recurrent, moderate: Secondary | ICD-10-CM | POA: Diagnosis not present

## 2019-07-30 DIAGNOSIS — Z5111 Encounter for antineoplastic chemotherapy: Secondary | ICD-10-CM | POA: Diagnosis not present

## 2019-07-30 MED ORDER — HEPARIN SOD (PORK) LOCK FLUSH 100 UNIT/ML IV SOLN
500.0000 [IU] | Freq: Once | INTRAVENOUS | Status: AC
  Administered 2019-07-30: 250 [IU] via INTRAVENOUS
  Filled 2019-07-30: qty 5

## 2019-07-30 MED ORDER — SODIUM CHLORIDE 0.9% FLUSH
10.0000 mL | INTRAVENOUS | Status: DC | PRN
  Administered 2019-07-30: 10 mL via INTRAVENOUS
  Filled 2019-07-30: qty 10

## 2019-07-30 NOTE — Progress Notes (Signed)
Cynthia Velasquez presented for PICC d/c. PICC line located in the right antecubital. PICC removed with tip intact at 38cm. Procedure without incident. Patient tolerated procedure well. Pt discharged to home with VSS

## 2019-07-30 NOTE — Patient Instructions (Signed)
PICC Removal, Adult, Care After This sheet gives you information about how to care for yourself after your procedure. Your health care provider may also give you more specific instructions. If you have problems or questions, contact your health care provider. What can I expect after the procedure? After your procedure, it is common to have:  Tenderness or soreness.  Redness, swelling, or a scab where the PICC was removed (exit site). Follow these instructions at home: For the first 24 hours after the procedure   Keep the bandage (dressing) on the exit site clean and dry. Do not remove the dressing until your health care provider tells you to do so.  Check your arm often for signs and symptoms of an infection. Check for: ? A red streak that spreads away from the dressing. ? Blood or fluid that you can see on the dressing. ? More redness or swelling.  Do not lift anything heavy or do activities that require great effort until your health care provider says it is okay. You should avoid: ? Lifting weights. ? Yard work. ? Any physical activity with repetitive arm movement.  Watch closely for any signs of an air bubble in the vein (air embolism). This is a rare but serious complication. If you have signs of air embolism, call 911 immediately and lie down on your left side to keep the air from moving into the lungs. Signs of an air embolism include: ? Difficulty breathing. ? Chest pain. ? Coughing or wheezing. ? Skin that is pale, blue, cold, or clammy. ? Rapid pulse. ? Rapid breathing. ? Fainting. After 24 Hours have passed:  Remove your dressing as told by your health care provider. Make sure you wash your hands with soap and water before and after you change the dressing. If soap and water are not available, use hand sanitizer.  Return to your normal activities as told by your health care provider.  A small scab may develop over the exit site. Do not pick at the scab.  When  bathing or showering, gently wash the exit site with soap and water. Pat it dry.  Watch for signs of infection, such as: ? Fever or chills. ? Swollen glands under the arm. ? More redness, swelling, or soreness in the arm. ? Blood, fluid, or pus coming from the exit site. ? Warmth or a bad smell at the exit site. ? A red streak spreading away from the exit site. General instructions  Take over-the-counter and prescription medicines only as told by your health care provider. Do not take any new medicines without checking with your health care provider first.  If you were prescribed an antibiotic medicine, apply or take it as told by your health care provider. Do not stop using the antibiotic even if your condition improves.  Keep all follow-up visits as told by your health care provider. This is important. Contact a health care provider if:  You have a fever or chills.  You have soreness, redness, or swelling on your exit site, and it gets worse.  You have swollen glands under your arm.  You have any of the following symptoms at your exit site: ? Blood, fluid, or pus. ? Unusual warmth. ? A bad smell. ? A red streak spreading away from the exit site. Get help right away if:  You have numbness or tingling in your fingers, hand, or arm.  Your arm looks blue and feels cold.  You have signs of an air embolism, such   as: ? Difficulty breathing. ? Chest pain. ? Coughing or wheezing. ? Skin that is pale, blue, cold, or clammy. ? Rapid pulse. ? Rapid breathing. ? Fainting. These symptoms may represent a serious problem that is an emergency. Do not wait to see if the symptoms will go away. Get medical help right away. Call your local emergency services (911 in the [place] Do not drive yourself to the hospital. Summary  After your procedure, it is common to have tenderness or soreness, redness, swelling, or a scab at the exit site.  Keep the dressing over the exit site clean and dry.  Do not remove the dressing until your health care provider tells you to do so.  Do not lift anything heavy or do activities that require great effort until your health care provider says it is okay.  Watch closely for any signs of an air embolism. If you have signs of air embolism, call 911 immediately and lie down on your left side. This information is not intended to replace advice given to you by your health care provider. Make sure you discuss any questions you have with your health care provider. Document Released: 09/07/2013 Document Revised: 08/15/2017 Document Reviewed: 10/29/2016 Elsevier Patient Education  2020 [employer]  

## 2019-07-30 NOTE — Patient Instructions (Addendum)
PICC Home Care Guide ° °A peripherally inserted central catheter (PICC) is a form of IV access that allows medicines and IV fluids to be quickly distributed throughout the body. The PICC is a long, thin, flexible tube (catheter) that is inserted into a vein in the upper arm. The catheter ends in a large vein in the chest (superior vena cava, or SVC). After the PICC is inserted, a chest X-ray may be done to make sure that it is in the correct place. °A PICC may be placed for different reasons, such as: °· To give medicines and liquid nutrition. °· To give IV fluids and blood products. °· If there is trouble placing a peripheral intravenous (PIV) catheter. °If taken care of properly, a PICC can remain in place for several months. Having a PICC can also allow a person to go home from the hospital sooner. Medicine and PICC care can be managed at home by a family member, caregiver, or home health care team. °What are the risks? °Generally, having a PICC is safe. However, problems may occur, including: °· A blood clot (thrombus) forming in or at the tip of the PICC. °· A blood clot forming in a vein (deep vein thrombosis) or traveling to the lung (pulmonary embolism). °· Inflammation of the vein (phlebitis) in which the PICC is placed. °· Infection. Central line associated blood stream infection (CLABSI) is a serious infection that often requires hospitalization. °· PICC movement (malposition). The PICC tip may move from its original position due to excessive physical activity, forceful coughing, sneezing, or vomiting. °· A break or cut in the PICC. It is important not to use scissors near the PICC. °· Nerve or tendon irritation or injury during PICC insertion. °How to take care of your PICC °Preventing problems °· You and any caregivers should wash your hands often with soap. Wash hands: °? Before touching the PICC line or the infusion device. °? Before changing a bandage (dressing). °· Flush the PICC as told by your  health care provider. Let your health care provider know right away if the PICC is hard to flush or does not flush. Do not use force to flush the PICC. °· Do not use a syringe that is less than 10 mL to flush the PICC. °· Avoid blood pressure checks on the arm in which the PICC is placed. °· Never pull or tug on the PICC. °· Do not take the PICC out yourself. Only a trained clinical professional should remove the PICC. °· Use clean and sterile supplies only. Keep the supplies in a dry place. Do not reuse needles, syringes, or any other supplies. Doing that can lead to infection. °· Keep pets and children away from your PICC line. °· Check the PICC insertion site every day for signs of infection. Check for: °? Leakage. °? Redness, swelling, or pain. °? Fluid or blood. °? Warmth. °? Pus or a bad smell. °PICC dressing care °· Keep your PICC bandage (dressing) clean and dry to prevent infection. °· Do not take baths, swim, or use a hot tub until your health care provider approves. Ask your health care provider if you can take showers. You may only be allowed to take sponge baths for bathing. When you are allowed to shower: °? Ask your health care provider to teach you how to wrap the PICC line. °? Cover the PICC line with clear plastic wrap and tape to keep it dry while showering. °· Follow instructions from your health care provider   about how to take care of your insertion site and dressing. Make sure you: °? Wash your hands with soap and water before you change your bandage (dressing). If soap and water are not available, use hand sanitizer. °? Change your dressing as told by your health care provider. °? Leave stitches (sutures), skin glue, or adhesive strips in place. These skin closures may need to stay in place for 2 weeks or longer. If adhesive strip edges start to loosen and curl up, you may trim the loose edges. Do not remove adhesive strips completely unless your health care provider tells you to do  that. °· Change your PICC dressing if it becomes loose or wet. °General instructions ° °· Carry your PICC identification card or wear a medical alert bracelet at all times. °· Keep the tube clamped at all times, unless it is being used. °· Carry a smooth-edge clamp with you at all times to place on the tube if it breaks. °· Do not use scissors or sharp objects near the tube. °· You may bend your arm and move it freely. If your PICC is near or at the bend of your elbow, avoid activity with repeated motion at the elbow. °· Avoid lifting heavy objects as told by your health care provider. °· Keep all follow-up visits as told by your health care provider. This is important. °Disposal of supplies °· Throw away any syringes in a disposal container that is meant for sharp items (sharps container). You can buy a sharps container from a pharmacy, or you can make one by using an empty hard plastic bottle with a cover. °· Place any used dressings or infusion bags into a plastic bag. Throw that bag in the trash. °Contact a health care provider if: °· You have pain in your arm, ear, face, or teeth. °· You have a fever or chills. °· You have redness, swelling, or pain around the insertion site. °· You have fluid or blood coming from the insertion site. °· Your insertion site feels warm to the touch. °· You have pus or a bad smell coming from the insertion site. °· Your skin feels hard and raised around the insertion site. °Get help right away if: °· Your PICC is accidentally pulled all the way out. If this happens, cover the insertion site with a bandage or gauze dressing. Do not throw the PICC away. Your health care provider will need to check it. °· Your PICC was tugged or pulled and has partially come out. Do not  push the PICC back in. °· You cannot flush the PICC, it is hard to flush, or the PICC leaks around the insertion site when it is flushed. °· You hear a "flushing" sound when the PICC is flushed. °· You feel your  heart racing or skipping beats. °· There is a hole or tear in the PICC. °· You have swelling in the arm in which the PICC was inserted. °· You have a red streak going up your arm from where the PICC was inserted. °Summary °· A peripherally inserted central catheter (PICC) is a long, thin, flexible tube (catheter) that is inserted into a vein in the upper arm. °· The PICC is inserted using a sterile technique by a specially trained nurse or physician. Only a trained clinical professional should remove it. °· Keep your PICC identification card with you at all times. °· Avoid blood pressure checks on the arm in which the PICC is placed. °· If cared for   properly, a PICC can remain in place for several months. Having a PICC can also allow a person to go home from the hospital sooner. °This information is not intended to replace advice given to you by your health care provider. Make sure you discuss any questions you have with your health care provider. °Document Released: 03/09/2003 Document Revised: 08/15/2017 Document Reviewed: 10/05/2016 °Elsevier Patient Education © 2020 [employer] ° °

## 2019-07-30 NOTE — Telephone Encounter (Signed)
Called to inquire what will be done about her PICC line? Pump discontinued today and PICC was left in. Will he be pulled or left in till next treatment?

## 2019-07-31 ENCOUNTER — Encounter (INDEPENDENT_AMBULATORY_CARE_PROVIDER_SITE_OTHER): Payer: Self-pay

## 2019-08-01 ENCOUNTER — Encounter (INDEPENDENT_AMBULATORY_CARE_PROVIDER_SITE_OTHER): Payer: Self-pay

## 2019-08-02 ENCOUNTER — Ambulatory Visit: Payer: PPO

## 2019-08-02 ENCOUNTER — Other Ambulatory Visit: Payer: Self-pay

## 2019-08-02 ENCOUNTER — Ambulatory Visit
Admission: RE | Admit: 2019-08-02 | Discharge: 2019-08-02 | Disposition: A | Discharge status: Home / Self Care | Payer: PPO | Source: Ambulatory Visit | Attending: Radiation Oncology | Admitting: Radiation Oncology

## 2019-08-02 DIAGNOSIS — C211 Malignant neoplasm of anal canal: Secondary | ICD-10-CM | POA: Diagnosis not present

## 2019-08-02 DIAGNOSIS — C50412 Malignant neoplasm of upper-outer quadrant of left female breast: Secondary | ICD-10-CM | POA: Diagnosis not present

## 2019-08-02 NOTE — Progress Notes (Signed)
Counseling Intern Notes:  Spoke to patient on 08/02/2019 via phone for a counseling appointment. Pt reported feeling "miserable" today because of the many side effects of her radiation tx including, diarrhea, pain with urination, open sores, discomfort sitting, extreme fatigue, loss of appetite, and difficulty sleeping. Pt was relieved to have her PICC line removed last week, but she was also frustrated because she had to be assertive and make several calls to make it happen. Pt reports next week is her last radiation appointment, but she stated she worries the side effects will continue. Pt stated she wanted to keep the session brief today because she is physically and emotionally exhausted.  Next Counseling Session: 08/10/19 at 11:30 via phone  Art [facility] Counseling Intern Voicemail:  [phone]

## 2019-08-03 ENCOUNTER — Inpatient Hospital Stay: Payer: PPO

## 2019-08-03 ENCOUNTER — Encounter: Payer: Self-pay | Admitting: Radiation Oncology

## 2019-08-03 ENCOUNTER — Encounter: Payer: Self-pay | Admitting: Nurse Practitioner

## 2019-08-03 ENCOUNTER — Inpatient Hospital Stay (HOSPITAL_BASED_OUTPATIENT_CLINIC_OR_DEPARTMENT_OTHER): Payer: PPO | Admitting: Nurse Practitioner

## 2019-08-03 ENCOUNTER — Other Ambulatory Visit: Payer: Self-pay

## 2019-08-03 ENCOUNTER — Ambulatory Visit: Payer: PPO

## 2019-08-03 ENCOUNTER — Ambulatory Visit
Admission: RE | Admit: 2019-08-03 | Discharge: 2019-08-03 | Disposition: A | Discharge status: Home / Self Care | Payer: PPO | Source: Ambulatory Visit | Attending: Radiation Oncology | Admitting: Radiation Oncology

## 2019-08-03 ENCOUNTER — Telehealth: Payer: Self-pay | Admitting: *Deleted

## 2019-08-03 VITALS — BP 115/83 | HR 75 | Temp 98.9°F | Resp 18 | Wt 224.7 lb

## 2019-08-03 DIAGNOSIS — C210 Malignant neoplasm of anus, unspecified: Secondary | ICD-10-CM | POA: Diagnosis not present

## 2019-08-03 DIAGNOSIS — C211 Malignant neoplasm of anal canal: Secondary | ICD-10-CM | POA: Diagnosis not present

## 2019-08-03 DIAGNOSIS — E876 Hypokalemia: Secondary | ICD-10-CM | POA: Diagnosis not present

## 2019-08-03 DIAGNOSIS — C50412 Malignant neoplasm of upper-outer quadrant of left female breast: Secondary | ICD-10-CM | POA: Diagnosis not present

## 2019-08-03 DIAGNOSIS — R300 Dysuria: Secondary | ICD-10-CM | POA: Diagnosis not present

## 2019-08-03 DIAGNOSIS — Z5111 Encounter for antineoplastic chemotherapy: Secondary | ICD-10-CM | POA: Diagnosis not present

## 2019-08-03 DIAGNOSIS — C50411 Malignant neoplasm of upper-outer quadrant of right female breast: Secondary | ICD-10-CM

## 2019-08-03 LAB — CMP (CANCER CENTER ONLY)
ALT: 26 U/L (ref 0–44)
AST: 23 U/L (ref 15–41)
Albumin: 3.5 g/dL (ref 3.5–5.0)
Alkaline Phosphatase: 75 U/L (ref 38–126)
Anion gap: 12 (ref 5–15)
BUN: 10 mg/dL (ref 8–23)
CO2: 23 mmol/L (ref 22–32)
Calcium: 8.6 mg/dL — ABNORMAL LOW (ref 8.9–10.3)
Chloride: 106 mmol/L (ref 98–111)
Creatinine: 0.99 mg/dL (ref 0.44–1.00)
GFR, Est AFR Am: >60 mL/min (ref >60)
GFR, Estimated: 59 mL/min — ABNORMAL LOW (ref >60)
Glucose, Bld: 115 mg/dL — ABNORMAL HIGH (ref 70–99)
Potassium: 2.9 mmol/L — CL (ref 3.5–5.1)
Sodium: 141 mmol/L (ref 135–145)
Total Bilirubin: 0.5 mg/dL (ref 0.3–1.2)
Total Protein: 6.7 g/dL (ref 6.5–8.1)

## 2019-08-03 LAB — CBC WITH DIFFERENTIAL (CANCER CENTER ONLY)
Abs Immature Granulocytes: 0 10*3/uL (ref 0.00–0.07)
Basophils Absolute: 0 10*3/uL (ref 0.0–0.1)
Basophils Relative: 0 %
Eosinophils Absolute: 0 10*3/uL (ref 0.0–0.5)
Eosinophils Relative: 4 %
HCT: 37.4 % (ref 36.0–46.0)
Hemoglobin: 12.2 g/dL (ref 12.0–15.0)
Immature Granulocytes: 0 %
Lymphocytes Relative: 9 %
Lymphs Abs: 0.1 10*3/uL — ABNORMAL LOW (ref 0.7–4.0)
MCH: 27.3 pg (ref 26.0–34.0)
MCHC: 32.6 g/dL (ref 30.0–36.0)
MCV: 83.7 fL (ref 80.0–100.0)
Monocytes Absolute: 0.2 10*3/uL (ref 0.1–1.0)
Monocytes Relative: 20 %
Neutro Abs: 0.7 10*3/uL — ABNORMAL LOW (ref 1.7–7.7)
Neutrophils Relative %: 67 %
Platelet Count: 150 10*3/uL (ref 150–400)
RBC: 4.47 MIL/uL (ref 3.87–5.11)
RDW: 15.4 % (ref 11.5–15.5)
WBC Count: 1 10*3/uL — ABNORMAL LOW (ref 4.0–10.5)
nRBC: 0 % (ref 0.0–0.2)

## 2019-08-03 LAB — FERRITIN: Ferritin: 216 ng/mL (ref 11–307)

## 2019-08-03 LAB — IRON AND TIBC
Iron: 100 ug/dL (ref 41–142)
Saturation Ratios: 38 % (ref 21–57)
TIBC: 261 ug/dL (ref 236–444)
UIBC: 161 ug/dL (ref 120–384)

## 2019-08-03 MED ORDER — SODIUM CHLORIDE 0.9 % IV SOLN
Freq: Once | INTRAVENOUS | Status: AC
  Administered 2019-08-03: 11:00:00 via INTRAVENOUS
  Filled 2019-08-03: qty 1000

## 2019-08-03 MED ORDER — TRAMADOL HCL 50 MG PO TABS
50.0000 mg | ORAL_TABLET | Freq: Once | ORAL | Status: AC
  Administered 2019-08-03: 50 mg via ORAL

## 2019-08-03 MED ORDER — SILVER SULFADIAZINE 1 % EX CREA
TOPICAL_CREAM | Freq: Every day | CUTANEOUS | Status: DC
  Administered 2019-08-03: 16:00:00 via TOPICAL

## 2019-08-03 MED ORDER — SONAFINE EX EMUL: 1.0000 "application " | Freq: Two times a day (BID) | CUTANEOUS | Status: DC

## 2019-08-03 MED ORDER — TRAMADOL HCL 50 MG PO TABS
ORAL_TABLET | ORAL | Status: AC
  Filled 2019-08-03: qty 1

## 2019-08-03 NOTE — Progress Notes (Signed)
[facility]   [phone] [phone]   Clinic Follow up Note   Patient Care Team: Kathyrn Lass, MD as PCP - General (Family Medicine) Wonda Horner, MD as Consulting Physician (Gastroenterology) Truitt Merle, MD as Consulting Physician (Hematology) Alla Feeling, NP as Nurse Practitioner (Nurse Practitioner) 08/03/2019  CHIEF COMPLAINT: F/u anal cancer   SUMMARY OF ONCOLOGIC HISTORY: Oncology History Overview Note  Breast cancer of upper-outer quadrant of right female breast, (ER/PR positive, her2/Neu negative)   Primary site: Breast (Right)   Staging method: AJCC 7th Edition   Clinical: Stage IA (T1a, N0, cM0) signed by [name], MD on 09/07/2013 11:14 AM   Pathologic: Stage IA (T1a, N0, cM0) signed by [name], MD on 09/07/2013 11:14 AM   Summary: Stage IA (T1a, N0, cM0)  Breast cancer of upper-outer quadrant of left female breast (ER/PR/Her2Neu negative)   Primary site: Breast (Left)   Staging method: AJCC 7th Edition   Clinical: Stage IIA (T2, N0, cM0) signed by [name], MD on 09/07/2013 11:35 AM   Pathologic: Stage IIA (T2, N0, cM0) signed by [name], MD on 09/07/2013 11:35 AM   Summary: Stage IIA (T2, N0, cM0)     Breast cancer of upper-outer quadrant of left female breast ([place])  07/10/2007 Procedure   US biopsy showed invasive ductal carcinoma 0.8cm, Nottingham grade 3   07/30/2007 Surgery   She had left breast lumpectomy and LN biopsy which showed high grade invasive ductal cancer Nottingham grade 3, 2.7 cm, ER/PR/Her2 neu negative   11/24/2007 Surgery   Patient elected for bilateral mastectomy   09/07/2008 - 03/08/2009 Chemotherapy   dates are approximate: she received adriamycin and cytoxan followed by Taxol   03/08/2009 - 05/08/2009 Radiation Therapy   dates are approximate, she received XRT    Breast cancer of upper-outer quadrant of right female breast ([place])  07/10/2006 Procedure   stereotactic biopsy showed  atypical hyperplasia   10/23/2006 Surgery   right lumpectomy showed invasive ductal ca (Nottingham grade 1)  9m and DCIS, ER/PR positive Her 2 negative   10/09/2007 - 10/08/2012 Chemotherapy   Patient was placed on Tamoxifen   03/24/2017 Imaging   No acute findings. No evidence of recurrent carcinoma or metastatic disease   [place]  06/04/2019 Procedure   Colonoscopy per Dr. SAnson Fret Findings-the digital rectal exam revealed a 3 cm diameter firm rectal mass.  The mass was noncircumferential and located predominantly at the right bowel wall at the anorectal junction. A nonobstructing mass was found at the anus and in the rectum    06/04/2019 Initial Biopsy   Follow pathology: Large intestine, rectum biopsy: Invasive well to moderately differentiated squamous cell carcinoma.  No rectal mucosa present.  There is strong diffuse expression of P 16 immunostain.  CDX 2, p63 and mCEA immunostains are also used in the diagnostic work-up of the case.   06/04/2019 Initial Diagnosis   [place] cancer ([place]   06/21/2019 PET scan   IMPRESSION: 1. Anorectal primary. No hypermetabolic metastatic disease within the chest, abdomen, or pelvis. Perirectal nodes, at least 1 of which is new since 02/26/2018 CT, suspicious based on size and interval development. 2. Mild limitations secondary to beam hardening artifact from bilateral hip arthroplasty. 3.  Aortic Atherosclerosis (ICD10-I70.0).   06/28/2019 Cancer Staging   Staging form: Anus, AJCC 8th Edition - Clinical stage from 06/28/2019: Stage IIA (cT2, cN0, cM0) - Signed by BAlla Feeling NP on 06/28/2019   06/28/2019 -  Chemotherapy   concurrent chemoRT with Mitomycin and 5FU on week 1 and week 5 on starting 06/28/19.    06/28/2019 -  Radiation Therapy   concurrent chemoRT with Dr [name] starting on 06/28/19.      CURRENT THERAPY: concurrent chemoRT with 5FU/Mitomycin starting 06/28/19  INTERVAL HISTORY: Cynthia Velasquez returns for f/u as  scheduled. She continues RT. Last chemo from 11/9 - 11/13. She is exhausted and tearful today, frustrated by her pain and limitations from treatment effects. She had a bad weekend, severely fatigued. She was able to do some light chores and grocery shop, but became hot, dizzy, and anxious at the store and left in a hurry. She has open sores in her pelvis and groin, pain is 10/10. Dilaudid does not help, only makes her stay up all day, does not touch her pain. She applied neosporin which does not help. She has pain when trying to void and does not get much urine. She took Azo and increased fluids. Urine "burns like a match" when it comes out. She has a BM 1 hour after eating, stool ranges from water to solid. She took 4 imodium one day and had constipation, only blood and mucus came out. She took 1 dose linzess and stools are back to normal. Mouth feels like "sandpaper" without sores. Otherwise, no fever, chills, cough, chest pain, dyspnea.   To make matters more stressful, she still tries to care for her granddaughter who lives at home with her and daughter. Recently dealt with COVID. Patient has no symptoms. Granddaughter is better but now there is an outbreak at her school and will not return in person any time soon. Patient tries to help out but is frustrated that she is too tired. They keep distance at home and wear masks.    MEDICAL HISTORY:  Past Medical History:  Diagnosis Date  . Anxiety   . Arthritis   . Bilateral breast cancer ([place]) 10/21/2013  . Breast cancer ([place])   . Cancer ([place])   . Depression   . Gallstones   . GERD (gastroesophageal reflux disease)    doesn't take any meds for this  . Headache    h/o migraines      . History of bladder infections   . History of hiatal hernia   . History of migraine   . Hyperlipidemia    takes Simvastatin daily  . Hypertension    takes Hyzaar  . Joint pain   . Joint swelling   . Lumbar stenosis   . Neuropathy 09/07/2013  . Pneumonia   .  Pre-diabetes     SURGICAL HISTORY: Past Surgical History:  Procedure Laterality Date  . ABDOMINAL HYSTERECTOMY    . bone spur removed from left foot    . CHOLECYSTECTOMY    . COLONOSCOPY    . double mastectomy     . HERNIA REPAIR     umbilical  . right knee arthroscopy    . right shoulder arthroscopy    . surgery for hiatal hernia    . TONSILLECTOMY    . TOTAL HIP ARTHROPLASTY Left 02/12/2013  . TOTAL HIP ARTHROPLASTY Left 02/12/2013   Procedure: LEFT TOTAL HIP ARTHROPLASTY;  Surgeon: Kerin Salen, MD;  Location: [facility];  Service: Orthopedics;  Laterality: Left;  . TOTAL HIP ARTHROPLASTY Right 05/03/2013   Procedure: TOTAL HIP ARTHROPLASTY;  Surgeon: Kerin Salen, MD;  Location: [facility];  Service: Orthopedics;  Laterality: Right;  . TOTAL KNEE ARTHROPLASTY Right 02/27/2015  Procedure: TOTAL KNEE ARTHROPLASTY;  Surgeon: Frederik Pear, MD;  Location: [facility];  Service: Orthopedics;  Laterality: Right;  . TOTAL SHOULDER ARTHROPLASTY Right 09/12/2016   Procedure: RIGHT TOTAL SHOULDER ARTHROPLASTY;  Surgeon: Tania Ade, MD;  Location: [facility];  Service: Orthopedics;  Laterality: Right;  RIGHT TOTAL SHOULDER ARTHROPLASTY    I have reviewed the social history and family history with the patient and they are unchanged from previous note.  ALLERGIES:  is allergic to oxycodone-acetaminophen; codeine; and vicodin [hydrocodone-acetaminophen].  MEDICATIONS:  Current Outpatient Medications  Medication Sig Dispense Refill  . benzonatate (TESSALON) 100 MG capsule Take 1-2 capsules (100-200 mg total) by mouth 3 (three) times daily as needed for cough. 40 capsule 0  . fluconazole (DIFLUCAN) 100 MG tablet Take one tablet po on 07/12/2019, then second dose on 07/14/2019 2 tablet 0  . HYDROmorphone (DILAUDID) 2 MG tablet Take 0.5-1 tablets (1-2 mg total) by mouth every 4 (four) hours as needed for severe pain. 60 tablet 0  . LINZESS 72 MCG capsule     . losartan (COZAAR) 100 MG tablet TK 1 T PO D  6  .  ondansetron (ZOFRAN ODT) 4 MG disintegrating tablet Take 1 tablet (4 mg total) by mouth every 8 (eight) hours as needed for nausea or vomiting. 6 tablet 0  . PROAIR HFA 108 (90 Base) MCG/ACT inhaler 2 inhalations every 4-6 hours as needed. (Patient taking differently: Inhale 2 puffs into the lungs every 6 (six) hours as needed for wheezing or shortness of breath. ) 18 g 1  . promethazine (PHENERGAN) 25 MG tablet TK 1 T PO QID FOR 3 DAYS PRF VOM    . nitroGLYCERIN (NITROSTAT) 0.4 MG SL tablet Place 0.4 mg under the tongue every 5 (five) minutes x 3 doses as needed for chest pain.  0   No current facility-administered medications for this visit.     PHYSICAL EXAMINATION: ECOG PERFORMANCE STATUS: 2-3  Vitals:   08/03/19 0934 08/03/19 1256  BP: 118/84   Pulse: 84   Resp: 18   Temp: 98.3 F (36.8 C)   SpO2: 100% (P) 100%   Filed Weights   08/03/19 0934  Weight: 224 lb 11.2 oz (101.9 kg)    GENERAL:alert, no distress and comfortable SKIN: palms dry with mild hyperpigmentation  EYES:  sclera clear OROPHARYNX: no thrush or ulcers LUNGS: clear with normal breathing effort HEART: regular rate & rhythm, no lower extremity edema ABDOMEN: abdomen soft, non-tender and normal bowel sounds External perineal exam reveals moderate hyperpigmentation with scattered shallow ulceration and desquamation to pelvis/pannus. Moisture noted to anal area. No visible mass  NEURO: alert & oriented x 3 with fluent speech  LABORATORY DATA:  I have reviewed the data as listed CBC Latest Ref Rng & Units 08/03/2019 07/26/2019 07/19/2019  WBC 4.0 - 10.5 K/uL 1.0(L) 2.5(L) 2.9(L)  Hemoglobin 12.0 - 15.0 g/dL 12.2 12.8 13.1  Hematocrit 36.0 - 46.0 % 37.4 39.5 41.7  Platelets 150 - 400 K/uL 150 136(L) 85(L)     CMP Latest Ref Rng & Units 08/03/2019 07/26/2019 07/19/2019  Glucose 70 - 99 mg/dL 115(H) 106(H) 94  BUN 8 - 23 mg/dL '10 8 8  ' Creatinine 0.44 - 1.00 mg/dL 0.99 1.02(H) 1.11(H)  Sodium 135 - 145 mmol/L  141 144 142  Potassium 3.5 - 5.1 mmol/L 2.9(LL) 4.3 3.7  Chloride 98 - 111 mmol/L 106 108 108  CO2 22 - 32 mmol/L '23 27 25  ' Calcium 8.9 - 10.3 mg/dL 8.6(L) 8.7(L)  9.0  Total Protein 6.5 - 8.1 g/dL 6.7 6.8 6.7  Total Bilirubin 0.3 - 1.2 mg/dL 0.5 0.4 0.5  Alkaline Phos 38 - 126 U/L 75 85 89  AST 15 - 41 U/L '23 24 23  ' ALT 0 - 44 U/L '26 25 27      ' RADIOGRAPHIC STUDIES: I have personally reviewed the radiological images as listed and agreed with the findings in the report. No results found.   ASSESSMENT & PLAN: Azyriah Nevins a 68 y.o.femalewith    1. Skin toxicity and pain  -She has significant pain related to skin toxicity from radiation. Exam showed scattered ulceration in the pelvis.  -dilaudid is not helpful, she has allergies to oxycodone, hydrocodone, and codeine -will give tramadol in clinic today and give her a prescription if this is helpful. I discussed with pharmacy -she will see Cynthia Ha NP in radiation today and likely receive silvadene topical -will follow closely   1. Anal Squamous Cell Carcinoma, cT2cN0M0 -Diagnosed in 05/2019.She presented with rectal bleeding and anal pressure.Biopsy showeda 3 cm mass at the anorectal junction, path confirmedwell to moderately differentiated squamous cell carcinoma.10/5/20PET scan shows no metastasis.  Cynthia Velasquez for definitive chemotherapy and radiation,treatment goal is curative withwith success rate of 80-90%.We discussed surgery is usually reserved for residual disease or local recurrence after concurrent chemoradiation. -She started concurrent chemoRT with Mitomycin and 5FU on week 1 and week 5 on 06/28/19. -She toleratedfirst week of ChemoRT moderately well with 2 episodes ofdizziness withfeeling of flushed skin, and nausea,which resolved spontaneously. She also had taste change and mouth sorenessduring week 2 -Rectal bleeding resolved and pain improved after cycle 1. Mucositis resolved. She recovered  well -She developedperineal itching, exam showed hyperpigmentation, no significant skin breakdown. Cynthia Ha, PA in RT called in diflucan and completed the course.  -she developed easy bruising and mild rectal bleeding and bleeding at the gumline during week 4, likely due to thrombocytopenia, no epistaxis or major bleeding -She developed worsening rectal pain starting week 5, skin exam showed hyperpigmentation but no open skin; she continued dilaudid PRN -She completed day 28 - 32 chemo from 11/9 - 11/13; due to nadir plt count 72K and mild bleeding, mitomycin was dose reduced to 7.49m/m2. picc was d/c'd on 11/13 -Ms. SWeinsappears stable today but in severe discomfort and is very tearful over accumulating toxicities. Skin exam shows pelvic ulceration. Pain is not adequately managed. I recommend aggressive supportive care -she has dysuria, will check UA to r/o UTI vs radiation cystitis.  -Labs reviewed, ANC 0.7. We reviewed neutropenic precautions. Will repeat labs on 11/19 with more IVF; if [place]500, may need to hold RT. I discussed with ABryson Velasquez PUtahtoday -K 2.9 today, due to GI output and inadequate diet, will give 20 mEq in 1L NS today and start K supplement 1 tab BID -repeat labs on 11/19, IVF on 11/19, 11/21, and next week if needed -f/u with me 11/19 and with Dr. FBurr Medico11/23 on last day of RT  2. Rectal pain, rectal bleeding -rectal bleeding resolved after she started treatment, now has easy BM 2-3 times daily.  -rectal pain improved early in treatment course, but is now severe and related to skin breakdown  -dilaudid is not effective, makes her stay awake and does not improve her pain  -I reviewed options with pharmacy, due to allergies listed will try tramadol 50 mg po today -She has had intermittent blood and mucus per rectum, more towards the end of treatment. Will monitor closely. No  anemia today   3. Close household contact to confirmed COVID19 positive case -her granddaughter  developed fever and cough on 10/29, lives with the patient -Ms. Marton brought her to doctor on 10/30, flu and strep negative -she learned of COVID19 positive on 11/2 while in [facility] lobby waiting for appointments, patient is asymptomatic -Patient's COVID19 test on 11/2 was negative -her granddaughter has been cleared to return to school by the public health department per patient, she is no longer symptomatic. However, there was an outbreak at her school and she did not return.  -patient remains asymptomatic. She wears a mask at home and distances herself from granddaughter as able.  4. H/o bilateral breast cancer -2007: right breast biopsy showed atypical hyperplasia, s/p right lumpectomy in 06/2006 ER/PR+/HER2-, grade 1. Completed tamoxifen from 09/2007 - 09/2012 -2008: left breast invasive ductal carcinoma, triple negative, grade 3 s/p lumpectomy in 07/2007 and eventual bilateral mastectomy; s/p adjuvant chemo AC-T and radiation   5. Genetics -patient is adopted, no known family history  -she reportedly had genetic testing which was negative. Her daughter also negative. I do not have the report.  6. HTN -on losartan -will monitor on chemo, she may require dose adjustments and or discontinuation of BP meds during therapy if she experiences hypotension  -BP 07/25/83   7. Anemia -baseline labsfrom 10/12/20reveals normal hemoglobin.Iron studies in low-normal range, ferritin 15. -It wasrecommended shestart oral iron once daily if she can tolerate, and to take with orange juice/vitamin C for better absorption. -Hgb remains normal, 12.2 today -ferritin 216, iron studies in normal range today 08/03/19  -continue monitoring   PLAN: -Labs reviewed -ANC 0.7, reviewed neutropenic precautions -Repeat labs on 11/19, if ANC <0.5 may hold RT  -supportive care, 1 L NS + 20 mEq KCL today, tramadol for pain in clinic -continue supportive IVF on 11/19, 11/21 and as needed -f/u with me 11/19,  with [name] on 11/23 with last RT -UA when patient can provide sample -PA to see patient in RT for skin care, likely will need silvadene   No problem-specific Assessment & Plan notes found for this encounter.   Orders Placed This Encounter  Procedures  . Urine Culture    Standing Status:   Future    Standing Expiration Date:   08/02/2020  . Urinalysis, Complete w Microscopic    Standing Status:   Future    Standing Expiration Date:   08/02/2020   All questions were answered. The patient knows to call the clinic with any problems, questions or concerns. No barriers to learning was detected. I spent 20 minutes counseling the patient face to face. The total time spent in the appointment was 25 minutes and more than 50% was on counseling and review of test results     Alla Feeling, NP 08/03/19

## 2019-08-03 NOTE — Progress Notes (Signed)
I spoke with the patient today and examined her skin. She has very early desquamation, I would classify this as dry desquamation in the pannicular fold. Her groins and perianal region are also intact but hyperpigmentated. She describes severe pain despite pain medication and medical oncology is providing narcotics. We gave her silvadene and asked her to use it sparingly to the open sites, and leave the rest of the skin dry if possible. We will follow up with her labs on Thursday as well to ensure she's not significantly dropping her ANC. Her ANC today was 700 and it is reasonable to treat. She will complete today's fraction.

## 2019-08-03 NOTE — Telephone Encounter (Signed)
[employer] called with critical, pt Potassium 2.5. Provider Cira Rue NP made aware.

## 2019-08-04 ENCOUNTER — Other Ambulatory Visit: Payer: Self-pay | Admitting: Nurse Practitioner

## 2019-08-04 ENCOUNTER — Ambulatory Visit: Payer: PPO

## 2019-08-04 ENCOUNTER — Telehealth: Payer: Self-pay | Admitting: Nurse Practitioner

## 2019-08-04 ENCOUNTER — Ambulatory Visit
Admission: RE | Admit: 2019-08-04 | Discharge: 2019-08-04 | Disposition: A | Discharge status: Home / Self Care | Payer: PPO | Source: Ambulatory Visit | Attending: Radiation Oncology | Admitting: Radiation Oncology

## 2019-08-04 ENCOUNTER — Other Ambulatory Visit: Payer: Self-pay

## 2019-08-04 ENCOUNTER — Inpatient Hospital Stay: Payer: PPO

## 2019-08-04 ENCOUNTER — Telehealth: Payer: Self-pay | Admitting: *Deleted

## 2019-08-04 DIAGNOSIS — C211 Malignant neoplasm of anal canal: Secondary | ICD-10-CM | POA: Diagnosis not present

## 2019-08-04 DIAGNOSIS — R300 Dysuria: Secondary | ICD-10-CM

## 2019-08-04 DIAGNOSIS — C50412 Malignant neoplasm of upper-outer quadrant of left female breast: Secondary | ICD-10-CM | POA: Diagnosis not present

## 2019-08-04 DIAGNOSIS — Z5111 Encounter for antineoplastic chemotherapy: Secondary | ICD-10-CM | POA: Diagnosis not present

## 2019-08-04 LAB — URINALYSIS, COMPLETE (UACMP) WITH MICROSCOPIC
Bilirubin Urine: NEGATIVE
Glucose, UA: NEGATIVE mg/dL
Ketones, ur: NEGATIVE mg/dL
Nitrite: POSITIVE — AB
Protein, ur: NEGATIVE mg/dL
Specific Gravity, Urine: 1.011 (ref 1.005–1.030)
pH: 6 (ref 5.0–8.0)

## 2019-08-04 MED ORDER — TRAMADOL HCL 50 MG PO TABS: 50.0000 mg | ORAL_TABLET | Freq: Four times a day (QID) | ORAL | 0 refills | Status: DC | PRN

## 2019-08-04 MED ORDER — CIPROFLOXACIN HCL 500 MG PO TABS: 500.0000 mg | ORAL_TABLET | Freq: Every day | ORAL | 0 refills | Status: DC

## 2019-08-04 NOTE — Telephone Encounter (Signed)
Scheduled appt per 11/17 los.  Spoke with pt and she is aware of the appt date and time.

## 2019-08-04 NOTE — Telephone Encounter (Signed)
Per Cira Rue, NP, called and made pt aware that there was evidence of a UTI. Cipro was called in and pt is to take 1 tab daily x3 days. We will watch for culture and change antibiotic if needed. Pt verbalized understanding as was very appreciative.

## 2019-08-04 NOTE — Telephone Encounter (Signed)
-----   Message from Alla Feeling, NP sent at 08/04/2019  3:59 PM EST ----- Please let her know she has evidence of UTI. I called in cipro, take 1 tab daily x3 days. Will watch for culture and change antibiotic if needed.  Thanks, lacie

## 2019-08-05 ENCOUNTER — Encounter: Payer: Self-pay | Admitting: Nurse Practitioner

## 2019-08-05 ENCOUNTER — Ambulatory Visit: Payer: PPO

## 2019-08-05 ENCOUNTER — Inpatient Hospital Stay: Payer: PPO

## 2019-08-05 ENCOUNTER — Telehealth: Payer: Self-pay | Admitting: *Deleted

## 2019-08-05 ENCOUNTER — Ambulatory Visit
Admission: RE | Admit: 2019-08-05 | Discharge: 2019-08-05 | Disposition: A | Discharge status: Home / Self Care | Payer: PPO | Source: Ambulatory Visit | Attending: Radiation Oncology | Admitting: Radiation Oncology

## 2019-08-05 ENCOUNTER — Inpatient Hospital Stay (HOSPITAL_BASED_OUTPATIENT_CLINIC_OR_DEPARTMENT_OTHER): Payer: PPO | Admitting: Nurse Practitioner

## 2019-08-05 ENCOUNTER — Other Ambulatory Visit: Payer: Self-pay

## 2019-08-05 VITALS — BP 126/83 | HR 73 | Temp 98.0°F | Resp 17 | Ht 65.0 in | Wt 225.4 lb

## 2019-08-05 DIAGNOSIS — Z5111 Encounter for antineoplastic chemotherapy: Secondary | ICD-10-CM | POA: Diagnosis not present

## 2019-08-05 DIAGNOSIS — C210 Malignant neoplasm of anus, unspecified: Secondary | ICD-10-CM | POA: Diagnosis not present

## 2019-08-05 DIAGNOSIS — E876 Hypokalemia: Secondary | ICD-10-CM

## 2019-08-05 DIAGNOSIS — C50412 Malignant neoplasm of upper-outer quadrant of left female breast: Secondary | ICD-10-CM | POA: Diagnosis not present

## 2019-08-05 DIAGNOSIS — C211 Malignant neoplasm of anal canal: Secondary | ICD-10-CM | POA: Diagnosis not present

## 2019-08-05 LAB — CMP (CANCER CENTER ONLY)
ALT: 27 U/L (ref 0–44)
AST: 26 U/L (ref 15–41)
Albumin: 3.4 g/dL — ABNORMAL LOW (ref 3.5–5.0)
Alkaline Phosphatase: 76 U/L (ref 38–126)
Anion gap: 11 (ref 5–15)
BUN: 6 mg/dL — ABNORMAL LOW (ref 8–23)
CO2: 24 mmol/L (ref 22–32)
Calcium: 8.6 mg/dL — ABNORMAL LOW (ref 8.9–10.3)
Chloride: 106 mmol/L (ref 98–111)
Creatinine: 1.03 mg/dL — ABNORMAL HIGH (ref 0.44–1.00)
GFR, Est AFR Am: >60 mL/min (ref >60)
GFR, Estimated: 56 mL/min — ABNORMAL LOW (ref >60)
Glucose, Bld: 117 mg/dL — ABNORMAL HIGH (ref 70–99)
Potassium: 2.6 mmol/L — CL (ref 3.5–5.1)
Sodium: 141 mmol/L (ref 135–145)
Total Bilirubin: 0.5 mg/dL (ref 0.3–1.2)
Total Protein: 6.5 g/dL (ref 6.5–8.1)

## 2019-08-05 LAB — CBC WITH DIFFERENTIAL (CANCER CENTER ONLY)
Abs Immature Granulocytes: 0 10*3/uL (ref 0.00–0.07)
Basophils Absolute: 0 10*3/uL (ref 0.0–0.1)
Basophils Relative: 0 %
Eosinophils Absolute: 0 10*3/uL (ref 0.0–0.5)
Eosinophils Relative: 3 %
HCT: 35.6 % — ABNORMAL LOW (ref 36.0–46.0)
Hemoglobin: 11.5 g/dL — ABNORMAL LOW (ref 12.0–15.0)
Immature Granulocytes: 0 %
Lymphocytes Relative: 10 %
Lymphs Abs: 0.1 10*3/uL — ABNORMAL LOW (ref 0.7–4.0)
MCH: 26.9 pg (ref 26.0–34.0)
MCHC: 32.3 g/dL (ref 30.0–36.0)
MCV: 83.2 fL (ref 80.0–100.0)
Monocytes Absolute: 0.4 10*3/uL (ref 0.1–1.0)
Monocytes Relative: 31 %
Neutro Abs: 0.7 10*3/uL — ABNORMAL LOW (ref 1.7–7.7)
Neutrophils Relative %: 56 %
Platelet Count: 136 10*3/uL — ABNORMAL LOW (ref 150–400)
RBC: 4.28 MIL/uL (ref 3.87–5.11)
RDW: 15.5 % (ref 11.5–15.5)
WBC Count: 1.2 10*3/uL — ABNORMAL LOW (ref 4.0–10.5)
nRBC: 0 % (ref 0.0–0.2)

## 2019-08-05 MED ORDER — POTASSIUM CHLORIDE CRYS ER 20 MEQ PO TBCR: 20.0000 meq | EXTENDED_RELEASE_TABLET | Freq: Two times a day (BID) | ORAL | 0 refills | Status: DC

## 2019-08-05 MED ORDER — SODIUM CHLORIDE 0.9 % IV SOLN
Freq: Once | INTRAVENOUS | Status: AC
  Administered 2019-08-05: 10:00:00 via INTRAVENOUS
  Filled 2019-08-05: qty 1000

## 2019-08-05 NOTE — Patient Instructions (Signed)

## 2019-08-05 NOTE — Progress Notes (Signed)
[name]   Telephone:(336) [phone] Fax:(336) [phone]   Clinic Follow up Note   Patient Care Team: Kathyrn Lass, MD as PCP - General (Family Medicine) Wonda Horner, MD as Consulting Physician (Gastroenterology) Truitt Merle, MD as Consulting Physician (Hematology) Alla Feeling, NP as Nurse Practitioner (Nurse Practitioner)  Date of Service:  08/09/2019  CHIEF COMPLAINT: f/u of rectal cancer  SUMMARY OF ONCOLOGIC HISTORY: Oncology History Overview Note  Breast cancer of upper-outer quadrant of right female breast, (ER/PR positive, her2/Neu negative)   Primary site: Breast (Right)   Staging method: AJCC 7th Edition   Clinical: Stage IA (T1a, N0, cM0) signed by [name], MD on 09/07/2013 11:14 AM   Pathologic: Stage IA (T1a, N0, cM0) signed by [name], MD on 09/07/2013 11:14 AM   Summary: Stage IA (T1a, N0, cM0)  Breast cancer of upper-outer quadrant of left female breast (ER/PR/Her2Neu negative)   Primary site: Breast (Left)   Staging method: AJCC 7th Edition   Clinical: Stage IIA (T2, N0, cM0) signed by [name], MD on 09/07/2013 11:35 AM   Pathologic: Stage IIA (T2, N0, cM0) signed by [name], MD on 09/07/2013 11:35 AM   Summary: Stage IIA (T2, N0, cM0)     Breast cancer of upper-outer quadrant of left female breast ([place])  07/10/2007 Procedure   US biopsy showed invasive ductal carcinoma 0.8cm, Nottingham grade 3   07/30/2007 Surgery   She had left breast lumpectomy and LN biopsy which showed high grade invasive ductal cancer Nottingham grade 3, 2.7 cm, ER/PR/Her2 neu negative   11/24/2007 Surgery   Patient elected for bilateral mastectomy   09/07/2008 - 03/08/2009 Chemotherapy   dates are approximate: she received adriamycin and cytoxan followed by Taxol   03/08/2009 - 05/08/2009 Radiation Therapy   dates are approximate, she received XRT    Breast cancer of upper-outer quadrant of right female breast ([place])  07/10/2006 Procedure   stereotactic biopsy showed atypical hyperplasia   10/23/2006 Surgery   right lumpectomy showed invasive ductal ca (Nottingham grade 1)  45m and DCIS, ER/PR positive Her 2 negative   10/09/2007 - 10/08/2012 Chemotherapy   Patient was placed on Tamoxifen   03/24/2017 Imaging   No acute findings. No evidence of recurrent carcinoma or metastatic disease   [place] cancer ([facility]  06/04/2019 Procedure   Colonoscopy per Dr. SAnson Fret Findings-the digital rectal exam revealed a 3 cm diameter firm rectal mass.  The mass was noncircumferential and located predominantly at the right bowel wall at the anorectal junction. A nonobstructing mass was found at the anus and in the rectum    06/04/2019 Initial Biopsy   Follow pathology: Large intestine, rectum biopsy: Invasive well to moderately differentiated squamous cell carcinoma.  No rectal mucosa present.  There is strong diffuse expression of P 16 immunostain.  CDX 2, p63 and mCEA immunostains are also used in the diagnostic work-up of the case.   06/04/2019 Initial Diagnosis   Anal cancer ([facility]   06/21/2019 PET scan   IMPRESSION: 1. Anorectal primary. No hypermetabolic metastatic disease within the chest, abdomen, or pelvis. Perirectal nodes, at least 1 of which is new since 02/26/2018 CT, suspicious based on size and interval development. 2. Mild limitations secondary to beam hardening artifact from bilateral hip arthroplasty. 3.  Aortic Atherosclerosis (ICD10-I70.0).   06/28/2019 Cancer Staging   Staging form: Anus, AJCC 8th Edition - Clinical stage from 06/28/2019: Stage IIA (cT2, cN0, cM0) - Signed by BAlla Feeling NP on  06/28/2019   06/28/2019 - 07/26/2019 Chemotherapy   concurrent chemoRT with Mitomycin and 5FU on week 1 and week 5 on starting 06/28/19. Last dose on 07/26/19   06/28/2019 - 08/09/2019 Radiation Therapy   concurrent chemoRT with Dr [name] 06/28/19-08/09/19      CURRENT THERAPY:  concurrent chemoRT with Mitomycin and 5FU on  week 1 and week 5 06/28/19-08/09/19  INTERVAL HISTORY:  Cynthia Velasquez is here for a follow up. She presents to the clinic alone. She notes today is a hard day for her. She notes this is her last day of radiation. She notes when she eats she has diarrhea. She has been taking imodium and then becomes constipated. She also has raw skin with open sores of groin areas. She does use given cream which helps around her groin area only. She notes pins and needle pain around rectum 8/10. She has been taking Tramadol twice a day. She also notes UTI that has not resolved with 3 days of Cipro. She notes still having extreme Dysuria. She denies any chest related symptoms. She notes her Granddaughter had COVID but doing well.    REVIEW OF SYSTEMS:   Constitutional: Denies fevers, chills or abnormal weight loss Eyes: Denies blurriness of vision Ears, nose, mouth, throat, and face: Denies mucositis or sore throat Respiratory: Denies cough, dyspnea or wheezes Cardiovascular: Denies palpitation, chest discomfort or lower extremity swelling Gastrointestinal:  Denies nausea, heartburn or change in bowel habits (+) Rectal pain 8/10 Skin: Denies abnormal skin rashes (+) Skin breakdown of groin UA: (+) Dysuria  Lymphatics: Denies new lymphadenopathy or easy bruising Neurological:Denies numbness, tingling or new weaknesses Behavioral/Psych: Mood is stable, no new changes  All other systems were reviewed with the patient and are negative.  MEDICAL HISTORY:  Past Medical History:  Diagnosis Date  . Anxiety   . Arthritis   . Bilateral breast cancer ([place]) 10/21/2013  . Breast cancer ([place])   . Cancer ([place])   . Depression   . Gallstones   . GERD (gastroesophageal reflux disease)    doesn't take any meds for this  . Headache    h/o migraines      . History of bladder infections   . History of hiatal hernia   . History of migraine   . Hyperlipidemia    takes Simvastatin daily  . Hypertension    takes Hyzaar  .  Joint pain   . Joint swelling   . Lumbar stenosis   . Neuropathy 09/07/2013  . Pneumonia   . Pre-diabetes     SURGICAL HISTORY: Past Surgical History:  Procedure Laterality Date  . ABDOMINAL HYSTERECTOMY    . bone spur removed from left foot    . CHOLECYSTECTOMY    . COLONOSCOPY    . double mastectomy     . HERNIA REPAIR     umbilical  . right knee arthroscopy    . right shoulder arthroscopy    . surgery for hiatal hernia    . TONSILLECTOMY    . TOTAL HIP ARTHROPLASTY Left 02/12/2013  . TOTAL HIP ARTHROPLASTY Left 02/12/2013   Procedure: LEFT TOTAL HIP ARTHROPLASTY;  Surgeon: Kerin Salen, MD;  Location: [facility];  Service: Orthopedics;  Laterality: Left;  . TOTAL HIP ARTHROPLASTY Right 05/03/2013   Procedure: TOTAL HIP ARTHROPLASTY;  Surgeon: Kerin Salen, MD;  Location: [facility];  Service: Orthopedics;  Laterality: Right;  . TOTAL KNEE ARTHROPLASTY Right 02/27/2015   Procedure: TOTAL KNEE ARTHROPLASTY;  Surgeon: Frederik Pear, MD;  Location: [facility];  Service: Orthopedics;  Laterality: Right;  . TOTAL SHOULDER ARTHROPLASTY Right 09/12/2016   Procedure: RIGHT TOTAL SHOULDER ARTHROPLASTY;  Surgeon: Tania Ade, MD;  Location: [facility];  Service: Orthopedics;  Laterality: Right;  RIGHT TOTAL SHOULDER ARTHROPLASTY    I have reviewed the social history and family history with the patient and they are unchanged from previous note.  ALLERGIES:  is allergic to oxycodone-acetaminophen; codeine; and vicodin [hydrocodone-acetaminophen].  MEDICATIONS:  Current Outpatient Medications  Medication Sig Dispense Refill  . benzonatate (TESSALON) 100 MG capsule Take 1-2 capsules (100-200 mg total) by mouth 3 (three) times daily as needed for cough. 40 capsule 0  . ciprofloxacin (CIPRO) 500 MG tablet Take 1 tablet (500 mg total) by mouth daily. 3 tablet 0  . fluconazole (DIFLUCAN) 100 MG tablet Take one tablet po on 07/12/2019, then second dose on 07/14/2019 2 tablet 0  . HYDROmorphone (DILAUDID) 2 MG  tablet Take 0.5-1 tablets (1-2 mg total) by mouth every 4 (four) hours as needed for severe pain. 60 tablet 0  . LINZESS 72 MCG capsule     . losartan (COZAAR) 100 MG tablet TK 1 T PO D  6  . ondansetron (ZOFRAN ODT) 4 MG disintegrating tablet Take 1 tablet (4 mg total) by mouth every 8 (eight) hours as needed for nausea or vomiting. 6 tablet 0  . potassium chloride SA (KLOR-CON) 20 MEQ tablet Take 1 tablet (20 mEq total) by mouth 2 (two) times daily. 30 tablet 0  . PROAIR HFA 108 (90 Base) MCG/ACT inhaler 2 inhalations every 4-6 hours as needed. (Patient taking differently: Inhale 2 puffs into the lungs every 6 (six) hours as needed for wheezing or shortness of breath. ) 18 g 1  . promethazine (PHENERGAN) 25 MG tablet TK 1 T PO QID FOR 3 DAYS PRF VOM    . rosuvastatin (CRESTOR) 40 MG tablet Take 40 mg by mouth daily.    . traMADol (ULTRAM) 50 MG tablet Take 1-2 tablets (50-100 mg total) by mouth every 6 (six) hours as needed for severe pain. 45 tablet 0  . potassium chloride (KLOR-CON) 20 MEQ packet Take 20 mEq by mouth 2 (two) times daily. 30 packet 1   Current Facility-Administered Medications  Medication Dose Route Frequency Provider Last Rate Last Dose  . potassium chloride 10 mEq in 100 mL IVPB  10 mEq Intravenous Q1 Hr x 2 Truitt Merle, MD        PHYSICAL EXAMINATION: ECOG PERFORMANCE STATUS: 2 - Symptomatic, <50% confined to bed  Vitals:   08/09/19 0835  BP: 129/85  Pulse: 79  Resp: 18  Temp: 98.5 F (36.9 C)  SpO2: 94%   Filed Weights   08/09/19 0835  Weight: 226 lb 11.2 oz (102.8 kg)    GENERAL:alert, no distress and comfortable SKIN: skin color, texture, turgor are normal, no rashes or significant lesions (+) several small skin ulcers in skin fold of abdomen to groin area, no skin breakdown at perianal area   EYES: normal, Conjunctiva are pink and non-injected, sclera clear  NECK: supple, thyroid normal size, non-tender, without nodularity LYMPH:  no palpable  lymphadenopathy in the cervical, axillary  LUNGS: clear to auscultation and percussion with normal breathing effort HEART: regular rate & rhythm and no murmurs and no lower extremity edema ABDOMEN:abdomen soft, non-tender and normal bowel sounds Musculoskeletal:no cyanosis of digits and no clubbing  NEURO: alert & oriented x 3 with fluent speech, no focal motor/sensory deficits RECTAL: Benign external exam  LABORATORY DATA:  I have reviewed the data as listed CBC Latest Ref Rng & Units 08/09/2019 08/05/2019 08/03/2019  WBC 4.0 - 10.5 K/uL 2.2(L) 1.2(L) 1.0(L)  Hemoglobin 12.0 - 15.0 g/dL 11.2(L) 11.5(L) 12.2  Hematocrit 36.0 - 46.0 % 34.6(L) 35.6(L) 37.4  Platelets 150 - 400 K/uL 107(L) 136(L) 150     CMP Latest Ref Rng & Units 08/09/2019 08/05/2019 08/03/2019  Glucose 70 - 99 mg/dL 110(H) 117(H) 115(H)  BUN 8 - 23 mg/dL 7(L) 6(L) 10  Creatinine 0.44 - 1.00 mg/dL 0.95 1.03(H) 0.99  Sodium 135 - 145 mmol/L 144 141 141  Potassium 3.5 - 5.1 mmol/L 2.9(LL) 2.6(LL) 2.9(LL)  Chloride 98 - 111 mmol/L 108 106 106  CO2 22 - 32 mmol/L _0 Calcium 8.9 - 10.3 mg/dL 8.1(L) 8.6(L) 8.6(L)  Total Protein 6.5 - 8.1 g/dL 6.1(L) 6.5 6.7  Total Bilirubin 0.3 - 1.2 mg/dL 0.5 0.5 0.5  Alkaline Phos 38 - 126 U/L 74 76 75  AST 15 - 41 U/L 32 26 23  ALT 0 - 44 U/L _1 RADIOGRAPHIC STUDIES: I have personally reviewed the radiological images as listed and agreed with the findings in the report. No results found.   ASSESSMENT & PLAN:  Cynthia Velasquez is a 68 y.o. female with   1. Anal Squamous Cell Carcinoma, [id number] -She was diagnosed in 05/2019. She presented with rectal bleeding and anal pressure.Biopsy showeda 3 cm mass at the anorectal junction, path confirmedwell to moderately differentiated squamous cell carcinoma. 06/21/19 PET scan shows no metastasis.  -She started standard treatment with concurrent chemoRT with Mitomycin and 5FU on week 1 and week 5 on 06/28/19. Plan to  complete on 08/09/19.  -She notes worsening tolerance of ChemoRT. She has pain from skin breakdown, dysuria and postprandial diarrhea. Labs show mild pancytopenia. Physical exam benign with mild skin breakdown in lower abdomen skin fold. She will proceed with her last day of Radiation.  -I discussed managing her symptoms while she recovers. Continuing to keep her skin dry and clean, drink adequately and continue pain medication. I discussed her urinary changes may be permanent.  -Plan to monitor her response to treatment with PET scan in 3 months.  -F/u in 2 weeks. I encouraged her to contact use if she has fever, dehydration or worsened pain.   2. H/o bilateral breast cancer 2007: right breast biopsy showed atypical hyperplasia, s/p right lumpectomy in 06/2006 ER/PR+/HER2-, grade 1. Completed tamoxifen from 09/2007 - 09/2012 2008: left breast invasive ductal carcinoma, triple negative, grade 3 s/p lumpectomy in 07/2007 and eventual bilateral mastectomy; s/p adjuvant chemo AC-T and radiation   3. Genetics -patient is adopted, no known family history  -she reportedly had genetic testing which was negative. Her daughter also negative. I do not have the report.  4. HTN -on losartan. Will monitor on chemo  5. Anemia -Baseline labs from 06/28/19 reveals normal hemoglobin.Iron studies in low-normal range, ferritin 15. -I previously encouraged her to start oral iron.   6. Rectal pain, rectal bleeding -She notes constipation has currently subsided after she had 1 episode of diarrhea 2 days ago.  -She notes rectal and bleeding is improving with Radiation.   -is allergic to Vicodin, oxycodone due to itching and did not tolerate Dilaudid.  -She has been using Tramadol 2-3 times a day as needed. Will continue.   7. UTI, dysuria, Low urine output -UA on 11/18 shows positive nitrites, trace leukocytes, 11-20 WBC and  bacteria -This did not resolved with 3 days of Cipro.  -I discussed her dysuria  could be from Radiation causing irritation. She notes also having low urine output.  -Will repeat UA today (08/09/19). May give longer course antibiotics.   8. Hypokalemia  -She has not been able to tolerate oral potassium pill, with emesis.  -I will call in liquid potassium if level still low today.  -K 2.9 today, will arrange IV KCL 34mq today   PLAN: -UA and culture today  -Proceed with last day of RT  -IV KCL 213m today and I will call in KCL powder, [id number] -I sent a message to Dr. MooLisbeth Renshaw see if she would benefit from prednisone for her dysuria  -Lab and f/u in 2 weeks   No problem-specific Assessment & Plan notes found for this encounter.   Orders Placed This Encounter  Procedures  . Urine Culture    Standing Status:   Future    Standing Expiration Date:   08/08/2020  . Urinalysis, Complete w Microscopic    Standing Status:   Future    Standing Expiration Date:   08/08/2020   All questions were answered. The patient knows to call the clinic with any problems, questions or concerns. No barriers to learning was detected. I spent 20 minutes counseling the patient face to face. The total time spent in the appointment was 25 minutes and more than 50% was on counseling and review of test results     YanTruitt MerleD 08/09/2019   I, AmoJoslyn Devonm acting as scribe for YanTruitt MerleD.   I have reviewed the above documentation for accuracy and completeness, and I agree with the above.

## 2019-08-05 NOTE — Progress Notes (Signed)
[place]   [phone] [phone]   Clinic Follow up Note   Patient Care Team: Cynthia Lass, MD as PCP - General (Family Medicine) Cynthia Horner, MD as Consulting Physician (Gastroenterology) Cynthia Merle, MD as Consulting Physician (Hematology) Cynthia Feeling, Velasquez as Nurse Practitioner (Nurse Practitioner) 08/05/2019  CHIEF COMPLAINT: toxicity check, pain   SUMMARY OF ONCOLOGIC HISTORY: Oncology History Overview Note  Breast cancer of upper-outer quadrant of right female breast, (ER/PR positive, her2/Neu negative)   Primary site: Breast (Right)   Staging method: AJCC 7th Edition   Clinical: Stage IA (T1a, N0, cM0) signed by Cynthia Lark, MD on 09/07/2013 11:14 AM   Pathologic: Stage IA (T1a, N0, cM0) signed by Cynthia Lark, MD on 09/07/2013 11:14 AM   Summary: Stage IA (T1a, N0, cM0)  Breast cancer of upper-outer quadrant of left female breast (ER/PR/Her2Neu negative)   Primary site: Breast (Left)   Staging method: AJCC 7th Edition   Clinical: Stage IIA (T2, N0, cM0) signed by Cynthia Lark, MD on 09/07/2013 11:35 AM   Pathologic: Stage IIA (T2, N0, cM0) signed by Cynthia Lark, MD on 09/07/2013 11:35 AM   Summary: Stage IIA (T2, N0, cM0)     Breast cancer of upper-outer quadrant of left female breast ([place])  07/10/2007 Procedure   US biopsy showed invasive ductal carcinoma 0.8cm, Nottingham grade 3   07/30/2007 Surgery   She had left breast lumpectomy and LN biopsy which showed high grade invasive ductal cancer Nottingham grade 3, 2.7 cm, ER/PR/Her2 neu negative   11/24/2007 Surgery   Patient elected for bilateral mastectomy   09/07/2008 - 03/08/2009 Chemotherapy   dates are approximate: she received adriamycin and cytoxan followed by Taxol   03/08/2009 - 05/08/2009 Radiation Therapy   dates are approximate, she received XRT    Breast cancer of upper-outer quadrant of right female breast ([place])  07/10/2006 Procedure   stereotactic biopsy showed  atypical hyperplasia   10/23/2006 Surgery   right lumpectomy showed invasive ductal ca (Nottingham grade 1)  2m and DCIS, ER/PR positive Her 2 negative   10/09/2007 - 10/08/2012 Chemotherapy   Patient was placed on Tamoxifen   03/24/2017 Imaging   No acute findings. No evidence of recurrent carcinoma or metastatic disease   [place] cancer ([place]  06/04/2019 Procedure   Colonoscopy per Dr. SAnson Velasquez Findings-the digital rectal exam revealed a 3 cm diameter firm rectal mass.  The mass was noncircumferential and located predominantly at the right bowel wall at the anorectal junction. A nonobstructing mass was found at the anus and in the rectum    06/04/2019 Initial Biopsy   Follow pathology: Large intestine, rectum biopsy: Invasive well to moderately differentiated squamous cell carcinoma.  No rectal mucosa present.  There is strong diffuse expression of P 16 immunostain.  CDX 2, p63 and mCEA immunostains are also used in the diagnostic work-up of the case.   06/04/2019 Initial Diagnosis   Anal cancer ([place]   06/21/2019 PET scan   IMPRESSION: 1. Anorectal primary. No hypermetabolic metastatic disease within the chest, abdomen, or pelvis. Perirectal nodes, at least 1 of which is new since 02/26/2018 CT, suspicious based on size and interval development. 2. Mild limitations secondary to beam hardening artifact from bilateral hip arthroplasty. 3.  Aortic Atherosclerosis (ICD10-I70.0).   06/28/2019 Cancer Staging   Staging form: Anus, AJCC 8th Edition - Clinical stage from 06/28/2019: Stage IIA (cT2, cN0, cM0) - Signed by Cynthia Velasquez on 06/28/2019   06/28/2019 -  Chemotherapy   concurrent chemoRT with Mitomycin and 5FU on week 1 and week 5 on starting 06/28/19.    06/28/2019 -  Radiation Therapy   concurrent chemoRT with Dr Cynthia Velasquez starting on 06/28/19.      CURRENT THERAPY: ChemoRT with 5FU and mitomycin  INTERVAL HISTORY: Cynthia Velasquez returns for f/u as scheduled. She is a little  lightheaded today, did not eat this morning. She is snacking during today's appointment. She is still very fatigued but slightly better. After IVF and pain meds on 11/17 she had a good night's sleep, but not as good last night. Pain is 8/10 currently, mostly anal pain. Tramadol improves to 5/10. She got silvadene cream which also helps. Stools remain lost/watery after meals, once per day. No n/v. She finds it hard to find something to eat. No fever, chills, cough, chest pain, dyspnea, or leg swelling.   MEDICAL HISTORY:  Past Medical History:  Diagnosis Date   Anxiety    Arthritis    Bilateral breast cancer ([place]) 10/21/2013   Breast cancer ([place])    Cancer ([place])    Depression    Gallstones    GERD (gastroesophageal reflux disease)    doesn't take any meds for this   Headache    h/o migraines       History of bladder infections    History of hiatal hernia    History of migraine    Hyperlipidemia    takes Simvastatin daily   Hypertension    takes Hyzaar   Joint pain    Joint swelling    Lumbar stenosis    Neuropathy 09/07/2013   Pneumonia    Pre-diabetes     SURGICAL HISTORY: Past Surgical History:  Procedure Laterality Date   ABDOMINAL HYSTERECTOMY     bone spur removed from left foot     CHOLECYSTECTOMY     COLONOSCOPY     double mastectomy      HERNIA REPAIR     umbilical   right knee arthroscopy     right shoulder arthroscopy     surgery for hiatal hernia     TONSILLECTOMY     TOTAL HIP ARTHROPLASTY Left 02/12/2013   TOTAL HIP ARTHROPLASTY Left 02/12/2013   Procedure: LEFT TOTAL HIP ARTHROPLASTY;  Surgeon: Cynthia Salen, MD;  Location: [place];  Service: Orthopedics;  Laterality: Left;   TOTAL HIP ARTHROPLASTY Right 05/03/2013   Procedure: TOTAL HIP ARTHROPLASTY;  Surgeon: Cynthia Salen, MD;  Location: [place];  Service: Orthopedics;  Laterality: Right;   TOTAL KNEE ARTHROPLASTY Right 02/27/2015   Procedure: TOTAL KNEE ARTHROPLASTY;   Surgeon: Cynthia Pear, MD;  Location: [place];  Service: Orthopedics;  Laterality: Right;   TOTAL SHOULDER ARTHROPLASTY Right 09/12/2016   Procedure: RIGHT TOTAL SHOULDER ARTHROPLASTY;  Surgeon: Cynthia Ade, MD;  Location: [place];  Service: Orthopedics;  Laterality: Right;  RIGHT TOTAL SHOULDER ARTHROPLASTY    I have reviewed the social history and family history with the patient and they are unchanged from previous note.  ALLERGIES:  is allergic to oxycodone-acetaminophen; codeine; and vicodin [hydrocodone-acetaminophen].  MEDICATIONS:  Current Outpatient Medications  Medication Sig Dispense Refill   benzonatate (TESSALON) 100 MG capsule Take 1-2 capsules (100-200 mg total) by mouth 3 (three) times daily as needed for cough. 40 capsule 0   ciprofloxacin (CIPRO) 500 MG tablet Take 1 tablet (500 mg total) by mouth daily. 3 tablet 0   fluconazole (DIFLUCAN) 100 MG tablet Take one tablet po on 07/12/2019, then second  dose on 07/14/2019 2 tablet 0   LINZESS 72 MCG capsule      losartan (COZAAR) 100 MG tablet TK 1 T PO D  6   ondansetron (ZOFRAN ODT) 4 MG disintegrating tablet Take 1 tablet (4 mg total) by mouth every 8 (eight) hours as needed for nausea or vomiting. 6 tablet 0   PROAIR HFA 108 (90 Base) MCG/ACT inhaler 2 inhalations every 4-6 hours as needed. (Patient taking differently: Inhale 2 puffs into the lungs every 6 (six) hours as needed for wheezing or shortness of breath. ) 18 g 1   promethazine (PHENERGAN) 25 MG tablet TK 1 T PO QID FOR 3 DAYS PRF VOM     traMADol (ULTRAM) 50 MG tablet Take 1-2 tablets (50-100 mg total) by mouth every 6 (six) hours as needed for severe pain. 45 tablet 0   HYDROmorphone (DILAUDID) 2 MG tablet Take 0.5-1 tablets (1-2 mg total) by mouth every 4 (four) hours as needed for severe pain. 60 tablet 0   potassium chloride SA (KLOR-CON) 20 MEQ tablet Take 1 tablet (20 mEq total) by mouth 2 (two) times daily. 30 tablet 0   rosuvastatin (CRESTOR) 40  MG tablet Take 40 mg by mouth daily.     Current Facility-Administered Medications  Medication Dose Route Frequency Provider Last Rate Last Dose   sodium chloride 0.9 % 1,000 mL with potassium chloride 40 mEq infusion   Intravenous Once Cynthia Feeling, Velasquez 250 mL/hr at 08/05/19 1004      PHYSICAL EXAMINATION: ECOG PERFORMANCE STATUS: 2 - Symptomatic, <50% confined to bed  Vitals:   08/05/19 0903  BP: 126/83  Pulse: 73  Resp: 17  Temp: 98 F (36.7 C)  SpO2: 100%   Filed Weights   08/05/19 0903  Weight: 225 lb 6.4 oz (102.2 kg)    GENERAL:alert, no distress and comfortable SKIN: no rash  EYES: sclera clear LUNGS:  normal breathing effort HEART:  no lower extremity edema NEURO: alert & oriented x 3 with fluent speech Pelvic: external exam reveals continued hyperpigmentation and dry desquamation on R>L pelvis and groin  LABORATORY DATA:  I have reviewed the data as listed CBC Latest Ref Rng & Units 08/05/2019 08/03/2019 07/26/2019  WBC 4.0 - 10.5 K/uL 1.2(L) 1.0(L) 2.5(L)  Hemoglobin 12.0 - 15.0 g/dL 11.5(L) 12.2 12.8  Hematocrit 36.0 - 46.0 % 35.6(L) 37.4 39.5  Platelets 150 - 400 K/uL 136(L) 150 136(L)     CMP Latest Ref Rng & Units 08/05/2019 08/03/2019 07/26/2019  Glucose 70 - 99 mg/dL 117(H) 115(H) 106(H)  BUN 8 - 23 mg/dL 6(L) 10 8  Creatinine 0.44 - 1.00 mg/dL 1.03(H) 0.99 1.02(H)  Sodium 135 - 145 mmol/L 141 141 144  Potassium 3.5 - 5.1 mmol/L 2.6(LL) 2.9(LL) 4.3  Chloride 98 - 111 mmol/L 106 106 108  CO2 22 - 32 mmol/L '24 23 27  ' Calcium 8.9 - 10.3 mg/dL 8.6(L) 8.6(L) 8.7(L)  Total Protein 6.5 - 8.1 g/dL 6.5 6.7 6.8  Total Bilirubin 0.3 - 1.2 mg/dL 0.5 0.5 0.4  Alkaline Phos 38 - 126 U/L 76 75 85  AST 15 - 41 U/L '26 23 24  ' ALT 0 - 44 U/L '27 26 25      ' RADIOGRAPHIC STUDIES: I have personally reviewed the radiological images as listed and agreed with the findings in the report. No results found.   ASSESSMENT & PLAN: Cynthia Velasquez a 68  y.o.femalewith    1. Skin toxicity and pain  -She has significant  pain related to skin toxicity from radiation. Exam shows dry desquamation to pelvis and groin R>L  -dilaudid is not helpful, she has allergies to oxycodone, hydrocodone, and codeine -she was given Rx for tramadol and was also seen by RT on 11/17, given silvadene which helps -pain slightly improved. She was able to sleep better on 11/17 -continue pain meds and topical treatment  2. Anal Squamous Cell Carcinoma,cT2cN0M0 -Diagnosed in 05/2019.She presented with rectal bleeding and anal pressure.Biopsy showeda 3 cm mass at the anorectal junction, path confirmedwell to moderately differentiated squamous cell carcinoma.10/5/20PET scan shows no metastasis.  Dellis Anes candidate for definitive chemotherapy and radiation,treatment goal is curative withwith success rate of 80-90%.We discussed surgery is usually reserved for residual disease or local recurrence after concurrent chemoradiation. -She started concurrent chemoRT with Mitomycin and 5FU on week 1 and week 5 on 06/28/19. -She toleratedfirst week of ChemoRT moderately well with 2 episodes ofdizziness withfeeling of flushed skin, and nausea,which resolved spontaneously. She also had taste change and mouth sorenessduring week 2 -Rectal bleeding resolvedand pain improved after cycle 1.Mucositis resolved. She recovered well -She developedperineal itching, exam showed hyperpigmentation, no significant skin breakdown. Bryson Ha, PA in RT called in diflucanand completed the course. -she developed easy bruising and mild rectal bleeding and bleeding at the gumline during week 4, likely due to thrombocytopenia, no epistaxis or major bleeding -She developed worsening rectal pain starting week 5, skin exam showed hyperpigmentation but no open skin; she continued dilaudid PRN -She completed day 28 - 32 chemo from 11/9 - 11/13; due to nadir plt count 72K and mild bleeding,  mitomycin was dose reduced to 7.72m/m2. picc was d/c'd on 11/13 -on 11/17 f/u she was in severe pain and emotionally distraught due to side effects. She was given IVF and tramadol. Pain improved somewhat -Due to reports of dysuria and little urine stream UA showed evidence of nitrite positive UTI, she started Cipro on 11/18 -She remains neutropenic, ANC 0.7. We again reviewed neutropenic precautions.  -K was 2.9 on 11/17, likely due to GI output and inadequate diet, she received IVF with 20 mEq KCL and told to start K supplement 1 tab BID but it was not prescribed -She will receive 1L NS + 40 mEq today for K 2.6. I prescribed oral K 20 mEq BID which she will start tonight -she will get fluids on 11/21 and f/u with Dr. FBurr Medico11/23 on last day of RT  3. Rectal pain, rectal bleeding -rectal bleeding resolved after she started treatment, now has easy BM 2-3 times daily.  -rectal pain improved early in treatment course, but is now severe and related to skin breakdown  -dilaudid is not effective, makes her stay awake and does not improve her pain  -she started tramadol on 11/17 which helps bring pain from 8 to 5/10. Continue for now  -She has had intermittent blood and mucus per rectum, more towards the end of treatment. Will monitor closely.  4. Close household contact to confirmed COVID19 positive case -her granddaughter developed fever and cough on 10/29, lives with the patient -Ms. SBobobrought her to doctor on 10/30, flu and strep negative -[facility] for [facility], patient is asymptomatic -Patient's COVID19 test on 11/2 was negative -her granddaughter has been cleared to return to school by the public health department per patient, she is no longer symptomatic. However, there was an outbreak at her school and she did not return.  -patient remains asymptomatic. She  wears a mask at home and distances herself from granddaughter as  able.  5. H/o bilateral breast cancer -2007: right breast biopsy showed atypical hyperplasia, s/p right lumpectomy in 06/2006 ER/PR+/HER2-, grade 1. Completed tamoxifen from 09/2007 - 09/2012 -2008: left breast invasive ductal carcinoma, triple negative, grade 3 s/p lumpectomy in 07/2007 and eventual bilateral mastectomy; s/p adjuvant chemo AC-T and radiation   6. Genetics -patient is adopted, no known family history  -she reportedly had genetic testing which was negative. Her daughter also negative. I do not have the report.  7. HTN -on losartan -stable  8. Anemia -baseline labsfrom 10/12/20reveals normal hemoglobin.Iron studies in low-normal range, ferritin 15. -It wasrecommended shestart oral iron once daily if she can tolerate, and to take with orange juice/vitamin C for better absorption. -ferritin 216, iron studies in normal range today 08/03/19  -Hgb 12.2 on 11/17 dropped to 11.5 today. Continue monitoring   9. UTI -UA on 11/18 shows positive nitrites, trace leukocytes, 11-20 WBC and bacteria -she was started on Cipro x3 days   PLAN: -labs reviewed, ANC 0.7 -Continue neutropenic precautions -Continue tramadol PRN and silvadene for skin toxicities  -1L NS + 40 mEq KCL today over 4 hours, start oral K BID -Continue RT -IVF 11/21 -Lab and f/u with [name] 11/23 with last day RT  -Continue Cipro for UTI, culture is pending   All questions were answered. The patient knows to call the clinic with any problems, questions or concerns. No barriers to learning was detected. I spent 20 minutes counseling the patient face to face. The total time spent in the appointment was 25 minutes and more than 50% was on counseling and review of test results     Cynthia Feeling, Velasquez 08/05/19

## 2019-08-05 NOTE — Telephone Encounter (Signed)
[employer] called with critical value. Potassium 2.6. Cira Rue, NP made aware

## 2019-08-05 NOTE — Progress Notes (Signed)
Pt ate and drank during infusion without any issues.  Tolerated IVF with potassium well.  Pt aware that potassium oral supplement sent to pharmacy by NP Lacie and to f/u as needed for any further questions or concerns.  Ambulatory w/belongings to radiation appt.

## 2019-08-06 ENCOUNTER — Other Ambulatory Visit: Payer: Self-pay

## 2019-08-06 ENCOUNTER — Ambulatory Visit
Admission: RE | Admit: 2019-08-06 | Discharge: 2019-08-06 | Disposition: A | Discharge status: Home / Self Care | Payer: PPO | Source: Ambulatory Visit | Attending: Radiation Oncology | Admitting: Radiation Oncology

## 2019-08-06 ENCOUNTER — Telehealth: Payer: Self-pay | Admitting: *Deleted

## 2019-08-06 ENCOUNTER — Telehealth: Payer: Self-pay | Admitting: Nurse Practitioner

## 2019-08-06 ENCOUNTER — Ambulatory Visit: Payer: PPO

## 2019-08-06 DIAGNOSIS — C210 Malignant neoplasm of anus, unspecified: Secondary | ICD-10-CM

## 2019-08-06 DIAGNOSIS — C50412 Malignant neoplasm of upper-outer quadrant of left female breast: Secondary | ICD-10-CM | POA: Diagnosis not present

## 2019-08-06 DIAGNOSIS — C211 Malignant neoplasm of anal canal: Secondary | ICD-10-CM | POA: Diagnosis not present

## 2019-08-06 LAB — URINE CULTURE

## 2019-08-06 NOTE — Progress Notes (Signed)
NS

## 2019-08-06 NOTE — Telephone Encounter (Signed)
No los per 11/19 °

## 2019-08-06 NOTE — Telephone Encounter (Signed)
Per Philippa Chester from microbiology lab, called with corrective report on pt urine culture. Suggested recollection on urine culture. Corrective report from 11/18 urine culture; 50,000 colonies /mL Staphylococcus Aureus. Message was sent to Cira Rue, NP, and [name].

## 2019-08-06 NOTE — Telephone Encounter (Signed)
Per Dr.[name], followed-up with pt to see if pt had improved with cipro. Pt stated, "I don't feel as much pain or burning." Dr.[name] made aware and pt will be seen Monday by provider.

## 2019-08-06 NOTE — Telephone Encounter (Signed)
-----   Message from Truitt Merle, MD sent at 08/06/2019  8:59 AM EST ----- Please call pt and check if her symptoms have improved with cipro, no need to come in for urine test if she feels better, let me know if not  Thanks  Krista Blue ----- Message ----- From: Merril Abbe, LPN Sent: [id number]   8:51 AM EST To: Alla Feeling, NP, Truitt Merle, MD  Microbiology called back with corrective report on her urine culture.  Suggesting recollection. Reports 50,000 colonies/mL Staphylococcus Aureus

## 2019-08-07 ENCOUNTER — Inpatient Hospital Stay: Payer: PPO

## 2019-08-07 ENCOUNTER — Telehealth: Payer: Self-pay

## 2019-08-07 VITALS — BP 134/42 | HR 76 | Temp 97.8°F | Resp 16

## 2019-08-07 DIAGNOSIS — Z5111 Encounter for antineoplastic chemotherapy: Secondary | ICD-10-CM | POA: Diagnosis not present

## 2019-08-07 DIAGNOSIS — C210 Malignant neoplasm of anus, unspecified: Secondary | ICD-10-CM

## 2019-08-07 MED ORDER — SODIUM CHLORIDE 0.9 % IV SOLN
INTRAVENOUS | Status: DC
  Administered 2019-08-07: 09:00:00 via INTRAVENOUS
  Filled 2019-08-07 (×2): qty 250

## 2019-08-07 NOTE — Patient Instructions (Signed)

## 2019-08-07 NOTE — Telephone Encounter (Signed)
Pt. Here today for fluids she stated she is taking Cipro and that she thinks its not working. Pt. Stated she took medication the day she had vomiting. Informed Pt to give the medication a couple more days to get in her system and to give [name] a call next week  if the burning doesn't subside. In basket sent to [facility] to inform of this matter.

## 2019-08-09 ENCOUNTER — Telehealth: Payer: Self-pay | Admitting: *Deleted

## 2019-08-09 ENCOUNTER — Inpatient Hospital Stay (HOSPITAL_BASED_OUTPATIENT_CLINIC_OR_DEPARTMENT_OTHER): Payer: PPO | Admitting: Hematology

## 2019-08-09 ENCOUNTER — Telehealth: Payer: Self-pay

## 2019-08-09 ENCOUNTER — Other Ambulatory Visit: Payer: Self-pay

## 2019-08-09 ENCOUNTER — Inpatient Hospital Stay: Payer: PPO

## 2019-08-09 ENCOUNTER — Encounter: Payer: Self-pay | Admitting: Radiation Oncology

## 2019-08-09 ENCOUNTER — Ambulatory Visit
Admission: RE | Admit: 2019-08-09 | Discharge: 2019-08-09 | Disposition: A | Discharge status: Home / Self Care | Payer: PPO | Source: Ambulatory Visit | Attending: Radiation Oncology | Admitting: Radiation Oncology

## 2019-08-09 ENCOUNTER — Encounter: Payer: Self-pay | Admitting: Hematology

## 2019-08-09 ENCOUNTER — Telehealth: Payer: Self-pay | Admitting: Hematology

## 2019-08-09 VITALS — BP 122/79 | HR 78 | Temp 98.0°F | Resp 16

## 2019-08-09 VITALS — BP 129/85 | HR 79 | Temp 98.5°F | Resp 18 | Ht 65.0 in | Wt 226.7 lb

## 2019-08-09 DIAGNOSIS — E876 Hypokalemia: Secondary | ICD-10-CM

## 2019-08-09 DIAGNOSIS — C211 Malignant neoplasm of anal canal: Secondary | ICD-10-CM | POA: Diagnosis not present

## 2019-08-09 DIAGNOSIS — C50412 Malignant neoplasm of upper-outer quadrant of left female breast: Secondary | ICD-10-CM | POA: Diagnosis not present

## 2019-08-09 DIAGNOSIS — C210 Malignant neoplasm of anus, unspecified: Secondary | ICD-10-CM

## 2019-08-09 DIAGNOSIS — N3000 Acute cystitis without hematuria: Secondary | ICD-10-CM

## 2019-08-09 DIAGNOSIS — Z5111 Encounter for antineoplastic chemotherapy: Secondary | ICD-10-CM | POA: Diagnosis not present

## 2019-08-09 LAB — URINALYSIS, COMPLETE (UACMP) WITH MICROSCOPIC
Bilirubin Urine: NEGATIVE
Glucose, UA: NEGATIVE mg/dL
Ketones, ur: NEGATIVE mg/dL
Nitrite: POSITIVE — AB
Protein, ur: 100 mg/dL — AB
Specific Gravity, Urine: 1.021 (ref 1.005–1.030)
WBC, UA: >50 WBC/hpf — ABNORMAL HIGH (ref 0–5)
pH: 5 (ref 5.0–8.0)

## 2019-08-09 LAB — CBC WITH DIFFERENTIAL (CANCER CENTER ONLY)
Abs Immature Granulocytes: 0.01 10*3/uL (ref 0.00–0.07)
Basophils Absolute: 0 10*3/uL (ref 0.0–0.1)
Basophils Relative: 1 %
Eosinophils Absolute: 0 10*3/uL (ref 0.0–0.5)
Eosinophils Relative: 1 %
HCT: 34.6 % — ABNORMAL LOW (ref 36.0–46.0)
Hemoglobin: 11.2 g/dL — ABNORMAL LOW (ref 12.0–15.0)
Immature Granulocytes: 1 %
Lymphocytes Relative: 14 %
Lymphs Abs: 0.3 10*3/uL — ABNORMAL LOW (ref 0.7–4.0)
MCH: 27.2 pg (ref 26.0–34.0)
MCHC: 32.4 g/dL (ref 30.0–36.0)
MCV: 84 fL (ref 80.0–100.0)
Monocytes Absolute: 0.7 10*3/uL (ref 0.1–1.0)
Monocytes Relative: 32 %
Neutro Abs: 1.2 10*3/uL — ABNORMAL LOW (ref 1.7–7.7)
Neutrophils Relative %: 51 %
Platelet Count: 107 10*3/uL — ABNORMAL LOW (ref 150–400)
RBC: 4.12 MIL/uL (ref 3.87–5.11)
RDW: 17 % — ABNORMAL HIGH (ref 11.5–15.5)
WBC Count: 2.2 10*3/uL — ABNORMAL LOW (ref 4.0–10.5)
nRBC: 0 % (ref 0.0–0.2)

## 2019-08-09 LAB — CMP (CANCER CENTER ONLY)
ALT: 29 U/L (ref 0–44)
AST: 32 U/L (ref 15–41)
Albumin: 3.2 g/dL — ABNORMAL LOW (ref 3.5–5.0)
Alkaline Phosphatase: 74 U/L (ref 38–126)
Anion gap: 10 (ref 5–15)
BUN: 7 mg/dL — ABNORMAL LOW (ref 8–23)
CO2: 26 mmol/L (ref 22–32)
Calcium: 8.1 mg/dL — ABNORMAL LOW (ref 8.9–10.3)
Chloride: 108 mmol/L (ref 98–111)
Creatinine: 0.95 mg/dL (ref 0.44–1.00)
GFR, Est AFR Am: >60 mL/min (ref >60)
GFR, Estimated: >60 mL/min (ref >60)
Glucose, Bld: 110 mg/dL — ABNORMAL HIGH (ref 70–99)
Potassium: 2.9 mmol/L — CL (ref 3.5–5.1)
Sodium: 144 mmol/L (ref 135–145)
Total Bilirubin: 0.5 mg/dL (ref 0.3–1.2)
Total Protein: 6.1 g/dL — ABNORMAL LOW (ref 6.5–8.1)

## 2019-08-09 MED ORDER — POTASSIUM CHLORIDE 10 MEQ/100ML IV SOLN
10.0000 meq | INTRAVENOUS | Status: AC
  Administered 2019-08-09 (×2): 10 meq via INTRAVENOUS
  Filled 2019-08-09: qty 100

## 2019-08-09 MED ORDER — POTASSIUM CHLORIDE 20 MEQ PO PACK: 20.0000 meq | PACK | Freq: Two times a day (BID) | ORAL | 1 refills | Status: DC

## 2019-08-09 MED ORDER — SODIUM CHLORIDE 0.9 % IV SOLN
INTRAVENOUS | Status: DC
  Administered 2019-08-09: 10:00:00 via INTRAVENOUS
  Filled 2019-08-09: qty 250

## 2019-08-09 NOTE — Telephone Encounter (Signed)
Spoke with patient while still at the [facility], explained we will give her Potassium IV and [name] is sending in a powder potassium in place of the pills.  She will go to the waiting room across from infusion after lab She verbalized an understanding.

## 2019-08-09 NOTE — Telephone Encounter (Signed)
Scheduled appt per 11/23 los.  Spoke with pt and she is aware of her appt date and time.

## 2019-08-09 NOTE — Patient Instructions (Signed)
Potassium chloride injection What is this medicine? POTASSIUM CHLORIDE (poe TASS i um KLOOR ide) is a potassium supplement used to prevent and to treat low potassium. Potassium is important for the heart, muscles, and nerves. Too much or too little potassium in the body can cause serious problems. This medicine may be used for other purposes; ask your health care provider or pharmacist if you have questions. COMMON BRAND NAME(S): PROAMP What should I tell my health care provider before I take this medicine? They need to know if you have any of these conditions:  Addison's disease  dehydration  diabetes  heart disease  high levels of potassium in the blood  irregular heartbeat  kidney disease  recent severe burn  an unusual or allergic reaction to potassium, other medicines, foods, dyes, or preservatives  pregnant or trying to get pregnant  breast-feeding How should I use this medicine? This medicine is for infusion into a vein. It is given by a health care professional in a hospital or clinic setting. Talk to your pediatrician regarding the use of this medicine in children. Special care may be needed. Overdosage: If you think you have taken too much of this medicine contact a poison control center or emergency room at once. NOTE: This medicine is only for you. Do not share this medicine with others. What if I miss a dose? This does not apply. What may interact with this medicine? Do not take this medicine with any of the following medications:  certain diuretics such as spironolactone, triamterene  eplerenone  sodium polystyrene sulfonate This medicine may also interact with the following medications:  certain medicines for blood pressure or heart disease like lisinopril, losartan, quinapril, valsartan  medicines that lower your chance of fighting infection such as cyclosporine, tacrolimus  NSAIDs, medicines for pain and inflammation, like ibuprofen or  naproxen  other potassium supplements  salt substitutes This list may not describe all possible interactions. Give your health care provider a list of all the medicines, herbs, non-prescription drugs, or dietary supplements you use. Also tell them if you smoke, drink alcohol, or use illegal drugs. Some items may interact with your medicine. What should I watch for while using this medicine? Your condition will be monitored carefully while you are receiving this medicine. You may need blood work done while you are taking this medicine. What side effects may I notice from receiving this medicine? Side effects that you should report to your doctor or health care professional as soon as possible:  allergic reactions like skin rash, itching or hives, swelling of the face, lips, or tongue  breathing problems  confusion  fast, irregular heartbeat  feeling faint or lightheaded, falls  low blood pressure  numbness or tingling in hands or feet  pain, redness, or irritation at site where injected  unusually weak or tired  weakness, heaviness of legs Side effects that usually do not require medical attention (report to your doctor or health care professional if they continue or are bothersome):  diarrhea  nausea, vomiting  stomach pain This list may not describe all possible side effects. Call your doctor for medical advice about side effects. You may report side effects to FDA at 1-800-FDA-1088. Where should I keep my medicine? This drug is given in a hospital or clinic and will not be stored at home. NOTE: This sheet is a summary. It may not cover all possible information. If you have questions about this medicine, talk to your doctor, pharmacist, or health care provider.    2020 Elsevier/Gold Standard (2016-06-05 13:59:20)  Coronavirus (COVID-19) Are you at risk?  Are you at risk for the Coronavirus (COVID-19)?  To be considered HIGH RISK for Coronavirus (COVID-19), you have to  meet the following criteria:  . Traveled to [place], [place], [name], [place] or [place]; or in the [place] to [place], [place], [place], or [place]; and have fever, cough, and shortness of breath within the last 2 weeks of travel OR . Been in close contact with a person diagnosed with COVID-19 within the last 2 weeks and have fever, cough, and shortness of breath . IF YOU DO NOT MEET THESE CRITERIA, YOU ARE CONSIDERED LOW RISK FOR COVID-19.  What to do if you are HIGH RISK for COVID-19?  . If you are having a medical emergency, call 911. . Seek medical care right away. Before you go to a doctor's office, urgent care or emergency department, call ahead and tell them about your recent travel, contact with someone diagnosed with COVID-19, and your symptoms. You should receive instructions from your physician's office regarding next steps of care.  . When you arrive at healthcare provider, tell the healthcare staff immediately you have returned from visiting [place], [place], [place], [place] or [name]; or traveled in the [place] to [place], [place], [place], or [place]; in the last two weeks or you have been in close contact with a person diagnosed with COVID-19 in the last 2 weeks.   . Tell the health care staff about your symptoms: fever, cough and shortness of breath. . After you have been seen by a medical provider, you will be either: o Tested for (COVID-19) and discharged home on quarantine except to seek medical care if symptoms worsen, and asked to  - Stay home and avoid contact with others until you get your results (4-5 days)  - Avoid travel on public transportation if possible (such as bus, train, or airplane) or o Sent to the Emergency Department by EMS for evaluation, COVID-19 testing, and possible admission depending on your condition and test results.  What to do if you are LOW RISK for COVID-19?  Reduce your risk of any infection by using the same  precautions used for avoiding the common cold or flu:  . Wash your hands often with soap and warm water for at least 20 seconds.  If soap and water are not readily available, use an alcohol-based hand sanitizer with at least 60% alcohol.  . If coughing or sneezing, cover your mouth and nose by coughing or sneezing into the elbow areas of your shirt or coat, into a tissue or into your sleeve (not your hands). . Avoid shaking hands with others and consider head nods or verbal greetings only. . Avoid touching your eyes, nose, or mouth with unwashed hands.  . Avoid close contact with people who are sick. . Avoid places or events with large numbers of people in one location, like concerts or sporting events. . Carefully consider travel plans you have or are making. . If you are planning any travel outside or inside the [place], visit the CDC's Travelers' Health webpage for the latest health notices. . If you have some symptoms but not all symptoms, continue to monitor at home and seek medical attention if your symptoms worsen. . If you are having a medical emergency, call 911.   ADDITIONAL HEALTHCARE OPTIONS FOR PATIENTS  [facility] / e-Visit: [email or web address]         [facility]: [phone]   Urgent Care: [phone]                   [facility]: [phone]   

## 2019-08-09 NOTE — Telephone Encounter (Signed)
[facility] called with critical value, pt potassium 2.9. Provider Cira Rue NP notified.

## 2019-08-10 ENCOUNTER — Other Ambulatory Visit: Payer: Self-pay | Admitting: Radiation Oncology

## 2019-08-10 LAB — URINE CULTURE: Culture: <10000 — AB

## 2019-08-11 ENCOUNTER — Other Ambulatory Visit: Payer: Self-pay

## 2019-08-11 ENCOUNTER — Telehealth: Payer: Self-pay

## 2019-08-11 ENCOUNTER — Other Ambulatory Visit: Payer: Self-pay | Admitting: Nurse Practitioner

## 2019-08-11 MED ORDER — PHENAZOPYRIDINE HCL 200 MG PO TABS: 200.0000 mg | ORAL_TABLET | Freq: Three times a day (TID) | ORAL | 0 refills | Status: DC | PRN

## 2019-08-11 MED ORDER — POTASSIUM CHLORIDE 20 MEQ/15ML (10%) PO SOLN: 20.0000 meq | Freq: Two times a day (BID) | ORAL | 0 refills | Status: DC

## 2019-08-11 NOTE — Telephone Encounter (Signed)
-----   Message from Truitt Merle, MD sent at 08/10/2019  4:40 PM EST ----- Thanks Jenny Reichmann. Her second urine culture yesterday was negative (insignificant growth) again. I think her abnormal UA and dysuria are related to radiation cystitis. She is miserable, is on pyridium and oxycodone, so if you plan to let her try steroids or something else, please call her directly.   [name], please call her tomorrow and let her know her urine culture from yesterday has been negative, make sure she is taking pyridium three times a day, and use oxycodone as needed.  Thanks  Krista Blue  ----- Message ----- From: Kyung Rudd, MD Sent: 08/10/2019   3:26 PM EST To: Truitt Merle, MD  We do cause significant irritation at the end, and if negative we typically give pyridium 200 mg TID. I haven't typically given steroids, it's not a bad thought if someone continues to be miserable despite pyridium/ Abx as indicated (and they're often also on pain meds which also helps). But I would think of it farther down the line.  Thanks, John ----- Message ----- From: Truitt Merle, MD Sent: 08/09/2019   9:07 AM EST To: Kyung Rudd, MD, Hayden Pedro, PA-C, #  Regino Bellow,  She has had worsening dysuria for 3 weeks, urine culture was indeterminate last week, antibiotics did not help last week. I am repeating her urine culture today, if negative, would you consider steroids for probable radiation cystitis?  Thanks   Krista Blue

## 2019-08-11 NOTE — Telephone Encounter (Signed)
I send in new Rx for liquid and d/c'd others.  Thanks, Regan Rakers

## 2019-08-11 NOTE — Telephone Encounter (Signed)
Spoke with patient per [name] advised second urine culture yesterday was negative. I asked if she has been taking the Pyridium and she says she never received this medication.  I have sent this into her pharmacy on file.

## 2019-08-11 NOTE — Telephone Encounter (Signed)
Patient calls stating that the Potassium chloride that was prescribed is not covered under her insurance. She is being told that Potassium Chlorid 20 meq/15 ml or 40 meq/15 ml is covered.  She is requesting this be sent into her pharmacy.    It is the [facility] on file.

## 2019-08-16 DIAGNOSIS — C210 Malignant neoplasm of anus, unspecified: Secondary | ICD-10-CM | POA: Diagnosis not present

## 2019-08-16 DIAGNOSIS — I10 Essential (primary) hypertension: Secondary | ICD-10-CM | POA: Diagnosis not present

## 2019-08-16 DIAGNOSIS — Z0000 Encounter for general adult medical examination without abnormal findings: Secondary | ICD-10-CM | POA: Diagnosis not present

## 2019-08-16 DIAGNOSIS — E7800 Pure hypercholesterolemia, unspecified: Secondary | ICD-10-CM | POA: Diagnosis not present

## 2019-08-17 NOTE — Progress Notes (Signed)
  Radiation Oncology         [phone] ________________________________  Name: Cynthia Velasquez MRN: [id number]  Date: 06/22/2019  DOB: 01-May-1951  SIMULATION AND TREATMENT PLANNING NOTE   DIAGNOSIS:     ICD-10-CM   1. Anal cancer ([place])  C21.0      CONSENT VERIFIED: yes   SET UP: Patient is set-up supine   IMMOBILIZATION: The following immobilization is used: Customized VAC lock bag. This complex treatment device will be used on a daily basis during the patient's treatment.   Diagnosis: Anal cancer   NARRATIVE: The patient was brought to the [facility]. Identity was confirmed. All relevant records and images related to the planned course of therapy were reviewed. Then, the patient was positioned in a stable reproducible clinical set-up for radiation therapy using a customized vac lock bag. Skin markings were placed. The CT images were loaded into the planning software where the target and avoidance structures were contoured.The radiation prescription was entered and confirmed.   The patient will receive 54 Gy in 30 fractions to the high-dose target region.  Daily image guidance is ordered, and this will be used on a daily basis. This is necessary to ensure accurate and precise localization of the target in addition to accurate alignment of the normal tissue structures in this region. This is particularly important given the possible motion of the high-dose target.  Treatment planning then occurred.   I have requested : Intensity Modulated Radiotherapy (IMRT) is medically necessary for this case for the following reason: Dose homogeneity; the target is in close proximity to critical normal structures, including the femoral heads, bladder, and small bowel. IMRT is thus medically to appropriately treat the patient.   Special treatment procedure  The patient will receive chemotherapy during the course of radiation treatment. The patient may experience increased or  overlapping toxicity due to this combined-modality approach and the patient will be monitored for such problems. This may include extra lab  work as necessary. This therefore constitutes a special treatment procedure.     ________________________________  Jodelle Gross, MD, PhD

## 2019-08-19 NOTE — Progress Notes (Signed)
[place]   [phone] [phone]   Clinic Follow up Note   Patient Care Team: Kathyrn Lass, MD as PCP - General (Family Medicine) Wonda Horner, MD as Consulting Physician (Gastroenterology) Truitt Merle, MD as Consulting Physician (Hematology) Alla Feeling, NP as Nurse Practitioner (Nurse Practitioner)  Date of Service:  08/20/2019  CHIEF COMPLAINT: f/u of rectal cancer  SUMMARY OF ONCOLOGIC HISTORY: Oncology History Overview Note  Breast cancer of upper-outer quadrant of right female breast, (ER/PR positive, her2/Neu negative)   Primary site: Breast (Right)   Staging method: AJCC 7th Edition   Clinical: Stage IA (T1a, N0, cM0) signed by [name], MD on 09/07/2013 11:14 AM   Pathologic: Stage IA (T1a, N0, cM0) signed by [name], MD on 09/07/2013 11:14 AM   Summary: Stage IA (T1a, N0, cM0)  Breast cancer of upper-outer quadrant of left female breast (ER/PR/Her2Neu negative)   Primary site: Breast (Left)   Staging method: AJCC 7th Edition   Clinical: Stage IIA (T2, N0, cM0) signed by [name], MD on 09/07/2013 11:35 AM   Pathologic: Stage IIA (T2, N0, cM0) signed by [name], MD on 09/07/2013 11:35 AM   Summary: Stage IIA (T2, N0, cM0)     Breast cancer of upper-outer quadrant of left female breast ([place])  07/10/2007 Procedure   US biopsy showed invasive ductal carcinoma 0.8cm, Nottingham grade 3   07/30/2007 Surgery   She had left breast lumpectomy and LN biopsy which showed high grade invasive ductal cancer Nottingham grade 3, 2.7 cm, ER/PR/Her2 neu negative   11/24/2007 Surgery   Patient elected for bilateral mastectomy   09/07/2008 - 03/08/2009 Chemotherapy   dates are approximate: she received adriamycin and cytoxan followed by Taxol   03/08/2009 - 05/08/2009 Radiation Therapy   dates are approximate, she received XRT    Breast cancer of upper-outer quadrant of right female breast ([place])  07/10/2006 Procedure   stereotactic biopsy showed atypical hyperplasia   10/23/2006 Surgery   right lumpectomy showed invasive ductal ca (Nottingham grade 1)  18m and DCIS, ER/PR positive Her 2 negative   10/09/2007 - 10/08/2012 Chemotherapy   Patient was placed on Tamoxifen   03/24/2017 Imaging   No acute findings. No evidence of recurrent carcinoma or metastatic disease   [place]  06/04/2019 Procedure   Colonoscopy per Dr. SAnson Fret Findings-the digital rectal exam revealed a 3 cm diameter firm rectal mass.  The mass was noncircumferential and located predominantly at the right bowel wall at the anorectal junction. A nonobstructing mass was found at the anus and in the rectum    06/04/2019 Initial Biopsy   Follow pathology: Large intestine, rectum biopsy: Invasive well to moderately differentiated squamous cell carcinoma.  No rectal mucosa present.  There is strong diffuse expression of P 16 immunostain.  CDX 2, p63 and mCEA immunostains are also used in the diagnostic work-up of the case.   06/04/2019 Initial Diagnosis   [place] cancer ([place]   06/21/2019 PET scan   IMPRESSION: 1. Anorectal primary. No hypermetabolic metastatic disease within the chest, abdomen, or pelvis. Perirectal nodes, at least 1 of which is new since 02/26/2018 CT, suspicious based on size and interval development. 2. Mild limitations secondary to beam hardening artifact from bilateral hip arthroplasty. 3.  Aortic Atherosclerosis (ICD10-I70.0).   06/28/2019 Cancer Staging   Staging form: Anus, AJCC 8th Edition - Clinical stage from 06/28/2019: Stage IIA (cT2, cN0, cM0) - Signed by BAlla Feeling NP on  06/28/2019   06/28/2019 - 07/26/2019 Chemotherapy   concurrent chemoRT with Mitomycin and 5FU on week 1 and week 5 on starting 06/28/19. Last dose on 07/26/19   06/28/2019 - 08/09/2019 Radiation Therapy   concurrent chemoRT with Dr [name] 06/28/19-08/09/19      CURRENT THERAPY:  Surveillance   INTERVAL HISTORY:  Cynthia Velasquez is here for a follow up. She presents to the clinic alone. She notes she still has rectal pain from [place]. She notes she is slowly recovering. She notes her rectal pain is tolerable but can get up to 7/10 when she has BM or urination. She cannot sit down after using BM. She notes skin peeling from her RT around groin area. She notes she wears pad for mucus discharge from vaginal area.  She notes she drinks about 45 ounces of water but not urinating as much as she should. She has no noticeable swelling. She denies dysuria. She notes she makes sure she eats adequately to maintain her weight although she still has no taste. She notes she has neuropathy, she feels she had an episode of neuropathy all over her upper body and became winded and fatigued. This lasted for 1 hour.  She notes her hair is thinning from prior chemo.  She notes her PCP wants to know if she can get her vaccinations.     REVIEW OF SYSTEMS:   Constitutional: Denies fevers, chills or abnormal weight loss (+) Loss of taste, adequate eating.  Eyes: Denies blurriness of vision Ears, nose, mouth, throat, and face: Denies mucositis or sore throat Respiratory: Denies cough, dyspnea or wheezes Cardiovascular: Denies palpitation, chest discomfort or lower extremity swelling Gastrointestinal:  Denies nausea, heartburn or change in bowel habits (+) rectal pain (7/10) UA: (+) Mucus vaginal discharge (+) Decreased urination Skin: Denies abnormal skin rashes (+) hair thinning Lymphatics: Denies new lymphadenopathy or easy bruising Behavioral/Psych: Mood is stable, no new changes  All other systems were reviewed with the patient and are negative.  MEDICAL HISTORY:  Past Medical History:  Diagnosis Date  . Anxiety   . Arthritis   . Bilateral breast cancer ([place]) 10/21/2013  . Breast cancer ([place])   . Cancer ([place])   . Depression   . Gallstones   . GERD (gastroesophageal reflux disease)    doesn't take any meds for this  .  Headache    h/o migraines      . History of bladder infections   . History of hiatal hernia   . History of migraine   . Hyperlipidemia    takes Simvastatin daily  . Hypertension    takes Hyzaar  . Joint pain   . Joint swelling   . Lumbar stenosis   . Neuropathy 09/07/2013  . Pneumonia   . Pre-diabetes     SURGICAL HISTORY: Past Surgical History:  Procedure Laterality Date  . ABDOMINAL HYSTERECTOMY    . bone spur removed from left foot    . CHOLECYSTECTOMY    . COLONOSCOPY    . double mastectomy     . HERNIA REPAIR     umbilical  . right knee arthroscopy    . right shoulder arthroscopy    . surgery for hiatal hernia    . TONSILLECTOMY    . TOTAL HIP ARTHROPLASTY Left 02/12/2013  . TOTAL HIP ARTHROPLASTY Left 02/12/2013   Procedure: LEFT TOTAL HIP ARTHROPLASTY;  Surgeon: Kerin Salen, MD;  Location: [place];  Service: Orthopedics;  Laterality: Left;  . TOTAL HIP ARTHROPLASTY  Right 05/03/2013   Procedure: TOTAL HIP ARTHROPLASTY;  Surgeon: Kerin Salen, MD;  Location: [facility];  Service: Orthopedics;  Laterality: Right;  . TOTAL KNEE ARTHROPLASTY Right 02/27/2015   Procedure: TOTAL KNEE ARTHROPLASTY;  Surgeon: Frederik Pear, MD;  Location: [place];  Service: Orthopedics;  Laterality: Right;  . TOTAL SHOULDER ARTHROPLASTY Right 09/12/2016   Procedure: RIGHT TOTAL SHOULDER ARTHROPLASTY;  Surgeon: Tania Ade, MD;  Location: [place];  Service: Orthopedics;  Laterality: Right;  RIGHT TOTAL SHOULDER ARTHROPLASTY    I have reviewed the social history and family history with the patient and they are unchanged from previous note.  ALLERGIES:  is allergic to oxycodone-acetaminophen; codeine; and vicodin [hydrocodone-acetaminophen].  MEDICATIONS:  Current Outpatient Medications  Medication Sig Dispense Refill  . benzonatate (TESSALON) 100 MG capsule Take 1-2 capsules (100-200 mg total) by mouth 3 (three) times daily as needed for cough. 40 capsule 0  . ciprofloxacin (CIPRO) 500 MG tablet  Take 1 tablet (500 mg total) by mouth daily. 3 tablet 0  . fluconazole (DIFLUCAN) 100 MG tablet Take one tablet po on 07/12/2019, then second dose on 07/14/2019 2 tablet 0  . HYDROmorphone (DILAUDID) 2 MG tablet Take 0.5-1 tablets (1-2 mg total) by mouth every 4 (four) hours as needed for severe pain. 60 tablet 0  . LINZESS 72 MCG capsule     . losartan (COZAAR) 100 MG tablet TK 1 T PO D  6  . ondansetron (ZOFRAN ODT) 4 MG disintegrating tablet Take 1 tablet (4 mg total) by mouth every 8 (eight) hours as needed for nausea or vomiting. 6 tablet 0  . phenazopyridine (PYRIDIUM) 200 MG tablet Take 1 tablet (200 mg total) by mouth 3 (three) times daily as needed for pain. 30 tablet 0  . potassium chloride 20 MEQ/15ML (10%) SOLN Take 15 mLs (20 mEq total) by mouth 2 (two) times daily. 473 mL 0  . PROAIR HFA 108 (90 Base) MCG/ACT inhaler 2 inhalations every 4-6 hours as needed. (Patient taking differently: Inhale 2 puffs into the lungs every 6 (six) hours as needed for wheezing or shortness of breath. ) 18 g 1  . rosuvastatin (CRESTOR) 40 MG tablet Take 40 mg by mouth daily.    . traMADol (ULTRAM) 50 MG tablet Take 1-2 tablets (50-100 mg total) by mouth every 6 (six) hours as needed for severe pain. 45 tablet 0   No current facility-administered medications for this visit.     PHYSICAL EXAMINATION: ECOG PERFORMANCE STATUS: 2 - Symptomatic, <50% confined to bed  Vitals:   08/20/19 0827  BP: (!) 133/94  Pulse: 87  Resp: 17  Temp: 97.8 F (36.6 C)  SpO2: 98%   Filed Weights   08/20/19 0827  Weight: 224 lb 4.8 oz (101.7 kg)    GENERAL:alert, no distress and comfortable SKIN: skin color, texture, turgor are normal, no rashes or significant lesions (+) Skin peeling of groin area EYES: normal, Conjunctiva are pink and non-injected, sclera clear  NECK: supple, thyroid normal size, non-tender, without nodularity LYMPH:  no palpable lymphadenopathy in the cervical, axillary or inguinal LUNGS:  clear to auscultation and percussion with normal breathing effort HEART: regular rate & rhythm and no murmurs and no lower extremity edema ABDOMEN:abdomen soft, non-tender and normal bowel sounds Musculoskeletal:no cyanosis of digits and no clubbing  NEURO: alert & oriented x 3 with fluent speech, no focal motor/sensory deficits RECTAL: Externally: No skin breakdown, skin healing well.     LABORATORY DATA:  I have reviewed  the data as listed CBC Latest Ref Rng & Units 08/20/2019 08/09/2019 08/05/2019  WBC 4.0 - 10.5 K/uL 2.7(L) 2.2(L) 1.2(L)  Hemoglobin 12.0 - 15.0 g/dL 11.2(L) 11.2(L) 11.5(L)  Hematocrit 36.0 - 46.0 % 35.6(L) 34.6(L) 35.6(L)  Platelets 150 - 400 K/uL 111(L) 107(L) 136(L)     CMP Latest Ref Rng & Units 08/20/2019 08/09/2019 08/05/2019  Glucose 70 - 99 mg/dL 111(H) 110(H) 117(H)  BUN 8 - 23 mg/dL 12 7(L) 6(L)  Creatinine 0.44 - 1.00 mg/dL 0.94 0.95 1.03(H)  Sodium 135 - 145 mmol/L 144 144 141  Potassium 3.5 - 5.1 mmol/L 3.5 2.9(LL) 2.6(LL)  Chloride 98 - 111 mmol/L 108 108 106  CO2 22 - 32 mmol/L _0 Calcium 8.9 - 10.3 mg/dL 8.4(L) 8.1(L) 8.6(L)  Total Protein 6.5 - 8.1 g/dL 6.5 6.1(L) 6.5  Total Bilirubin 0.3 - 1.2 mg/dL 0.5 0.5 0.5  Alkaline Phos 38 - 126 U/L 81 74 76  AST 15 - 41 U/L 37 32 26  ALT 0 - 44 U/L _1 RADIOGRAPHIC STUDIES: I have personally reviewed the radiological images as listed and agreed with the findings in the report. No results found.   ASSESSMENT & PLAN:  Jerome Otter is a 68 y.o. female with    1. Anal Squamous Cell Carcinoma, [id number] -She was diagnosed in 05/2019.She presented with rectal bleeding and anal pressure.Biopsy showeda 3 cm mass at the anorectal junction, path confirmedwell to moderately differentiated squamous cell carcinoma.10/5/20PET scan shows no metastasis.  -She recently completed standard treatment with concurrent chemoRT with Mitomycin and 5FU on 08/09/19.  -She continues to slowly recover  from chemo RT. Her pain is tolerable but can get up to 7/10 with and after BM. She notes decreased urine output and vaginal discharge. She is able to maintain weight with adequate eating. She is healing well based on exam today, no skin breakdown. Labs reviewed and continue to improve. She declined prescription pain medication  -Plan to repeat PET scan in 3 months to monitor response to treatment. Will continue supportive care while she recovers.  -F/u in mid January  -She is fine to proceed with PNA and Flu vaccination. It is too early to take shingles shot given she recently completed chemo.    2. H/o bilateral breast cancer 2007: right breast biopsy showed atypical hyperplasia, s/p right lumpectomy in 06/2006 ER/PR+/HER2-, grade 1. Completed tamoxifen from 09/2007 - 09/2012 2008: left breast invasive ductal carcinoma, triple negative, grade 3 s/p lumpectomy in 07/2007 and eventual bilateral mastectomy; s/p adjuvant chemo AC-T and radiation   3. Genetics -patient is adopted, no known family history  -she reportedly had genetic testing which was negative. Her daughter also negative. I do not have the report.  4. HTN -on losartan. Will monitor on chemo  5. Pancytopenia  -secondary to chemo, and iron deficiency. -improving, continue monitoring    6. Rectal pain, rectal bleeding -She notes constipation has currently subsided after she had 1 episode of diarrhea 2 days ago.  -Rectal bleeding has resolved. Her  -is allergic to Vicodin, oxycodone due to itching and did not tolerate Dilaudid. She has tramadol to use, but rather use Tylenol.   7. UTI, dysuria, Low urine output -UA on 11/18 shows positive nitrites, trace leukocytes, 11-20 WBC and bacteria -This did not resolve with 3 days of Cipro. Her repeat UA from 08/09/19 was negative.   -I discussed her dysuria is probably related to radiation causing irritation. She  is on Pyridium with improvement. She notes also having low urine  output. I encouraged her to drink more water       PLAN: -Lab and f/u in 5-6 weeks, will order PET scan on next visit    No problem-specific Assessment & Plan notes found for this encounter.   No orders of the defined types were placed in this encounter.  All questions were answered. The patient knows to call the clinic with any problems, questions or concerns. No barriers to learning was detected. I spent 20 minutes counseling the patient face to face. The total time spent in the appointment was 25 minutes and more than 50% was on counseling and review of test results     Truitt Merle, MD 08/20/2019   I, Joslyn Devon, am acting as scribe for Truitt Merle, MD.   I have reviewed the above documentation for accuracy and completeness, and I agree with the above.

## 2019-08-20 ENCOUNTER — Inpatient Hospital Stay (HOSPITAL_BASED_OUTPATIENT_CLINIC_OR_DEPARTMENT_OTHER): Payer: PPO | Admitting: Hematology

## 2019-08-20 ENCOUNTER — Telehealth: Payer: Self-pay | Admitting: *Deleted

## 2019-08-20 ENCOUNTER — Encounter: Payer: Self-pay | Admitting: Hematology

## 2019-08-20 ENCOUNTER — Other Ambulatory Visit: Payer: Self-pay

## 2019-08-20 ENCOUNTER — Inpatient Hospital Stay: Payer: PPO | Attending: Hematology

## 2019-08-20 VITALS — BP 133/94 | HR 87 | Temp 97.8°F | Resp 17 | Ht 65.0 in | Wt 224.3 lb

## 2019-08-20 DIAGNOSIS — D61810 Antineoplastic chemotherapy induced pancytopenia: Secondary | ICD-10-CM | POA: Insufficient documentation

## 2019-08-20 DIAGNOSIS — R300 Dysuria: Secondary | ICD-10-CM | POA: Diagnosis not present

## 2019-08-20 DIAGNOSIS — C210 Malignant neoplasm of anus, unspecified: Secondary | ICD-10-CM

## 2019-08-20 DIAGNOSIS — I10 Essential (primary) hypertension: Secondary | ICD-10-CM | POA: Insufficient documentation

## 2019-08-20 DIAGNOSIS — Z853 Personal history of malignant neoplasm of breast: Secondary | ICD-10-CM | POA: Insufficient documentation

## 2019-08-20 DIAGNOSIS — Z923 Personal history of irradiation: Secondary | ICD-10-CM | POA: Diagnosis not present

## 2019-08-20 DIAGNOSIS — K6289 Other specified diseases of anus and rectum: Secondary | ICD-10-CM | POA: Diagnosis not present

## 2019-08-20 LAB — CBC WITH DIFFERENTIAL (CANCER CENTER ONLY)
Abs Immature Granulocytes: 0.01 10*3/uL (ref 0.00–0.07)
Basophils Absolute: 0 10*3/uL (ref 0.0–0.1)
Basophils Relative: 1 %
Eosinophils Absolute: 0.1 10*3/uL (ref 0.0–0.5)
Eosinophils Relative: 3 %
HCT: 35.6 % — ABNORMAL LOW (ref 36.0–46.0)
Hemoglobin: 11.2 g/dL — ABNORMAL LOW (ref 12.0–15.0)
Immature Granulocytes: 0 %
Lymphocytes Relative: 26 %
Lymphs Abs: 0.7 10*3/uL (ref 0.7–4.0)
MCH: 26.9 pg (ref 26.0–34.0)
MCHC: 31.5 g/dL (ref 30.0–36.0)
MCV: 85.6 fL (ref 80.0–100.0)
Monocytes Absolute: 0.5 10*3/uL (ref 0.1–1.0)
Monocytes Relative: 18 %
Neutro Abs: 1.4 10*3/uL — ABNORMAL LOW (ref 1.7–7.7)
Neutrophils Relative %: 52 %
Platelet Count: 111 10*3/uL — ABNORMAL LOW (ref 150–400)
RBC: 4.16 MIL/uL (ref 3.87–5.11)
RDW: 19.1 % — ABNORMAL HIGH (ref 11.5–15.5)
WBC Count: 2.7 10*3/uL — ABNORMAL LOW (ref 4.0–10.5)
nRBC: 0 % (ref 0.0–0.2)

## 2019-08-20 LAB — CMP (CANCER CENTER ONLY)
ALT: 30 U/L (ref 0–44)
AST: 37 U/L (ref 15–41)
Albumin: 3.3 g/dL — ABNORMAL LOW (ref 3.5–5.0)
Alkaline Phosphatase: 81 U/L (ref 38–126)
Anion gap: 9 (ref 5–15)
BUN: 12 mg/dL (ref 8–23)
CO2: 27 mmol/L (ref 22–32)
Calcium: 8.4 mg/dL — ABNORMAL LOW (ref 8.9–10.3)
Chloride: 108 mmol/L (ref 98–111)
Creatinine: 0.94 mg/dL (ref 0.44–1.00)
GFR, Est AFR Am: >60 mL/min (ref >60)
GFR, Estimated: >60 mL/min (ref >60)
Glucose, Bld: 111 mg/dL — ABNORMAL HIGH (ref 70–99)
Potassium: 3.5 mmol/L (ref 3.5–5.1)
Sodium: 144 mmol/L (ref 135–145)
Total Bilirubin: 0.5 mg/dL (ref 0.3–1.2)
Total Protein: 6.5 g/dL (ref 6.5–8.1)

## 2019-08-20 MED ORDER — POTASSIUM CHLORIDE 20 MEQ/15ML (10%) PO SOLN: 20.0000 meq | Freq: Two times a day (BID) | ORAL | 0 refills | Status: DC

## 2019-08-20 NOTE — Telephone Encounter (Signed)
Initiating prior authorization for Potassium Chloride 20 MEQ packets, noted potassium chloride 20 meq/86ml solution ordered 08/11/2019 at 1816 failed to transmit.   Phone order provided.  Potassium solution covered per pharmacist.  Per pharmacist patient Co-pay = $91.88 of the $352.99.

## 2019-08-23 ENCOUNTER — Telehealth: Payer: Self-pay | Admitting: Hematology

## 2019-08-23 NOTE — Telephone Encounter (Signed)
Per 12/4 los lab and f/u already scheduled

## 2019-08-24 ENCOUNTER — Encounter: Payer: Self-pay | Admitting: *Deleted

## 2019-09-02 ENCOUNTER — Telehealth: Payer: Self-pay | Admitting: Radiation Oncology

## 2019-09-02 DIAGNOSIS — E7800 Pure hypercholesterolemia, unspecified: Secondary | ICD-10-CM | POA: Diagnosis not present

## 2019-09-02 DIAGNOSIS — Z853 Personal history of malignant neoplasm of breast: Secondary | ICD-10-CM | POA: Diagnosis not present

## 2019-09-02 DIAGNOSIS — C210 Malignant neoplasm of anus, unspecified: Secondary | ICD-10-CM

## 2019-09-02 DIAGNOSIS — F329 Major depressive disorder, single episode, unspecified: Secondary | ICD-10-CM | POA: Diagnosis not present

## 2019-09-02 DIAGNOSIS — M150 Primary generalized (osteo)arthritis: Secondary | ICD-10-CM | POA: Diagnosis not present

## 2019-09-02 DIAGNOSIS — I10 Essential (primary) hypertension: Secondary | ICD-10-CM | POA: Diagnosis not present

## 2019-09-02 DIAGNOSIS — F331 Major depressive disorder, recurrent, moderate: Secondary | ICD-10-CM | POA: Diagnosis not present

## 2019-09-02 NOTE — Telephone Encounter (Addendum)
  Radiation Oncology         [phone] ________________________________  Name: Cynthia Velasquez MRN: [id number]  Date of Service: 09/02/2019  DOB: [birth year]  Post Treatment Telephone Note  Diagnosis: Squamous Cell Carcinoma of the Anus.   Interval Since Last Radiation:  4 weeks   06/28/2019-08/09/2019:  The anus and regional nodes were treated to 54 Gy in 30 fractions  Narrative:  The patient was contacted today for routine follow-up. During treatment she did very well with radiotherapy though she did have difficulty with several UTIs and dysuria as well as moderate dry desquamation. She reports she is still having dysuria and is taking pyridium. She reports incontinence of urine. She reports her skin is well healed, but that she is also having rectal fullness, and thin caliber stools again as well as increased rectal mucous that is often tinged with blood.   Impression/Plan: 1. Squamous Cell Carcinoma of the Anus. The patient has had a difficult time during and even since completion of radiotherapy. I would like her to come tomorrow for repeat urine culture to rule out another infection, especially since she's using pads regularly with incontinence. I will send in hyoscamine to her pharmacy. She was also offered steroid suppositories but declines as she would like to see if time improves her symptoms. She will also continue with the PT staff at [facility] and I encouraged her to take the vaginal dilators we gave her during treatment so she can start to use them and have further instruction on this. I will follow up with her in a few weeks to see how she's doing, but if symptoms persist and she elects to have the anusol suppositories I'll notify nursing so they can send this in next week while I'm out of the office. We discussed that we would be happy to continue to follow annually and provide GYN care at those visit, but she will also continue to follow up with [name] in medical oncology. She  will also need to be followed with anoscopy with Dr. Penelope Coop and I'll ask our scheduler to coordinate with the patient.     Carola Rhine, PAC

## 2019-09-03 ENCOUNTER — Encounter: Payer: Self-pay | Admitting: Nurse Practitioner

## 2019-09-03 DIAGNOSIS — C210 Malignant neoplasm of anus, unspecified: Secondary | ICD-10-CM

## 2019-09-07 ENCOUNTER — Encounter: Payer: Self-pay | Admitting: Physical Therapy

## 2019-09-07 ENCOUNTER — Ambulatory Visit: Payer: PPO | Attending: Hematology | Admitting: Physical Therapy

## 2019-09-07 ENCOUNTER — Other Ambulatory Visit: Payer: Self-pay

## 2019-09-07 DIAGNOSIS — M6281 Muscle weakness (generalized): Secondary | ICD-10-CM | POA: Diagnosis not present

## 2019-09-07 DIAGNOSIS — R279 Unspecified lack of coordination: Secondary | ICD-10-CM | POA: Insufficient documentation

## 2019-09-07 NOTE — Patient Instructions (Signed)
Apply moisturizers externally to the vulvar area.  Apply to sensitive skin even labia minora  Moisturizers . They are used in the vagina to hydrate the mucous membrane that make up the vaginal canal. . Designed to keep a more normal acid balance (ph) . Once placed in the vagina, it will last between two to three days.  . Use 3 times per week at bedtime  . Ingredients to avoid is glycerin and fragrance, can increase chance of infection . Should not be used just before sex due to causing irritation . Most are gels administered either in a tampon-shaped applicator or as a vaginal suppository. They are non-hormonal.   Types of Moisturizers  . Vitamin E vaginal suppositories- Whole foods, Amazon . Moist Again . Coconut oil- can break down condoms . Julva- (Do no use if on Tamoxifen) amazon . Yes moisturizer- amazon . NeuEve Silk , NeuEve Silver for menopausal or over 65 (if have severe vaginal atrophy or cancer treatments use NeuEve Silk for  1 month than move to [employer])- [employer], MapleFlower.dk . Olive and Bee intimate cream- www.oliveandbee.com.au . Mae vaginal Ross . Aloe   Creams to use externally on the Vulva area  Albertson's (good for for cancer patients that had radiation to the area)- [place] or [employer].FlyingBasics.com.br  V-magic cream - amazon  Julva-amazon  Vital "V Wild Yam salve ( help moisturize and help with thinning vulvar area, does have Mustang by Irwin Brakeman labial moisturizer ([place],   Coconut or olive oil  aloe   Things to avoid in the vaginal area . Do not use things to irritate the vulvar area . No lotions just specialized creams for the vulva area- Neogyn, V-magic, No soaps; can use Aveeno or Calendula cleanser if needed. Must be gentle . No deodorants . No douches . Good to sleep without underwear to let the vaginal area to air out . No scrubbing:  spread the lips to let warm water rinse over labias and pat dry

## 2019-09-07 NOTE — Therapy (Addendum)
[facility] [address], [place], [place], [address] Phone: [phone]   Fax:  [phone]  Physical Therapy Evaluation  Patient Details  Name: Cynthia Velasquez MRN: [id number] Date of Birth: 1951-01-27 Referring Provider (PT): Truitt Merle, MD   Encounter Date: 09/07/2019  PT End of Session - 09/07/19 0936    Visit Number  1    Date for PT Re-Evaluation  12/28/19    PT Start Time  0933    PT Stop Time  1015    PT Time Calculation (min)  42 min    Activity Tolerance  Patient tolerated treatment well    Behavior During Therapy  [facility] for tasks assessed/performed       Past Medical History:  Diagnosis Date  . Anxiety   . Arthritis   . Bilateral breast cancer ([place]) 10/21/2013  . Breast cancer ([place])   . Cancer ([place])   . Depression   . Gallstones   . GERD (gastroesophageal reflux disease)    doesn't take any meds for this  . Headache    h/o migraines      . History of bladder infections   . History of hiatal hernia   . History of migraine   . Hyperlipidemia    takes Simvastatin daily  . Hypertension    takes Hyzaar  . Joint pain   . Joint swelling   . Lumbar stenosis   . Neuropathy 09/07/2013  . Pneumonia   . Pre-diabetes     Past Surgical History:  Procedure Laterality Date  . ABDOMINAL HYSTERECTOMY    . bone spur removed from left foot    . CHOLECYSTECTOMY    . COLONOSCOPY    . double mastectomy     . HERNIA REPAIR     umbilical  . right knee arthroscopy    . right shoulder arthroscopy    . surgery for hiatal hernia    . TONSILLECTOMY    . TOTAL HIP ARTHROPLASTY Left 02/12/2013  . TOTAL HIP ARTHROPLASTY Left 02/12/2013   Procedure: LEFT TOTAL HIP ARTHROPLASTY;  Surgeon: Kerin Salen, MD;  Location: [place];  Service: Orthopedics;  Laterality: Left;  . TOTAL HIP ARTHROPLASTY Right 05/03/2013   Procedure: TOTAL HIP ARTHROPLASTY;  Surgeon: Kerin Salen, MD;  Location: [place];  Service: Orthopedics;   Laterality: Right;  . TOTAL KNEE ARTHROPLASTY Right 02/27/2015   Procedure: TOTAL KNEE ARTHROPLASTY;  Surgeon: Frederik Pear, MD;  Location: [place];  Service: Orthopedics;  Laterality: Right;  . TOTAL SHOULDER ARTHROPLASTY Right 09/12/2016   Procedure: RIGHT TOTAL SHOULDER ARTHROPLASTY;  Surgeon: Tania Ade, MD;  Location: [place];  Service: Orthopedics;  Laterality: Right;  RIGHT TOTAL SHOULDER ARTHROPLASTY    There were no vitals filed for this visit.   Subjective Assessment - 09/07/19 0937    Subjective  Pt states she is having urinary leakage all the time mostly in the last month.  Sometimes a strong urge causes leakage and sometimes cannot feel that she is going.  Leakage with coughing, sneezing. Has burning with urinartion    Currently in Pain?  No/denies         [facility] PT Assessment - 09/13/19 0001      Assessment   Medical Diagnosis  C21.0 (ICD-10-CM) - Anal cancer [facility])    Referring Provider (PT)  Truitt Merle, MD    Onset Date/Surgical Date  --   done with treatment 1 month ago   Prior Therapy  No  Precautions   Precautions  None      Restrictions   Weight Bearing Restrictions  No      Balance Screen   Has the patient fallen in the past 6 months  No      [place] residence    Living Arrangements  Children   daughter and granddaughter     Prior Function   Level of Independence  Independent    Vocation  Full time employment    Vocation Requirements  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      [employer]   Overall Cognitive Status  Within Functional Limits for tasks assessed      Posture/Postural Control   Posture/Postural Control  Postural limitations    Postural Limitations  Rounded Shoulders;Anterior pelvic tilt      ROM / Strength   AROM / PROM / Strength  Strength      Strength   Overall Strength Comments  core and hip grossly 4/5 MMT      Flexibility   Soft Tissue Assessment /Muscle Length  yes     Hamstrings  80% h/s and hip adductors - tight and TTP      Palpation   Palpation comment  TTP hip adductors                Objective measurements completed on examination: See above findings.    Pelvic Floor Special Questions - 09/13/19 0001    Prior Pelvic/Prostate Exam  Yes    Prior Pregnancies  Yes    Number of Pregnancies  4    Number of Vaginal Deliveries  1    Currently Sexually Active  No    Urinary Leakage  Yes    How often  about 6x/ day that I can tell    Pad use  5-6/day    Urinary urgency  Yes    Falling out feeling (prolapse)  Yes    Activities that cause feeling of prolapse  not sure, just feel it when sitting    Skin Integrity  Intact    External Palpation  very TTP ischiocavernosis and bulbospongeosis - unable to bulge    Pelvic Floor Internal Exam  pt informed and identity confirmed for informed consent    Exam Type  Vaginal    Palpation  high tone and TTP throughout Lt>Rt    Strength  weak squeeze, no lift    Strength # of seconds  1    Tone  high                 PT Short Term Goals - 09/13/19 1219      PT SHORT TERM GOAL #1   Title  ind with initial HEP    Time  4    Period  Weeks    Status  New    Target Date  10/05/19      PT SHORT TERM GOAL #2   Title  pt will report 30% less leakage    Time  4    Period  Weeks    Status  New    Target Date  10/05/19        PT Long Term Goals - 09/07/19 0940      PT LONG TERM GOAL #1   Title  Pt will be able to reduce leakage and use only 1-2 pads/day    Baseline  5-6 pads/ day    Time  16  Period  Weeks    Status  New    Target Date  12/28/19      PT LONG TERM GOAL #2   Title  Pt will report no nocturia    Baseline  nocturia 1-2x/ night    Time  16    Period  Weeks    Status  New    Target Date  12/28/19      PT LONG TERM GOAL #3   Title  Pt will report she can feel the urge to urinate at least 90% of the time due to improved health of pelvic floor muscles    Baseline   leaks all throughout the day without any awareness of need to void    Time  16    Period  Weeks    Status  New    Target Date  12/28/19      PT LONG TERM GOAL #4   Title  Pt will report at least 60% less leakage due to improved pelvic strength    Time  16    Period  Weeks    Status  New    Target Date  12/28/19             Plan - 09/13/19 1221    Clinical Impression Statement  Pt presents to PT today due to symptoms related to recent cancer treatments.  Pt has had leakage throughout the day and using 5-6 pads/day. Pt has a lot of muscle tension and decreased soft tissue pliability i nmuscle tissue throughout the pelvic floor as well as tight h/s and hip adductors.  pt is unable to bulge the pelvic floor muscles.  She demosntrates weakness of her core and bilateral hips as mentioned above.  Pelvic floor has diminished ability to contract due to stiff and stenotic tissues.  pt will benefit from skilled PT in order to address impairments so that she can return to maximum functioanl activities and ipmrove quality of life.    Personal Factors and Comorbidities  Comorbidity 3+    Comorbidities  chemo, radiation, hx of breast cancer    Examination-Activity Limitations  Toileting;Continence    Examination-Participation Restrictions  Community Activity    Stability/Clinical Decision Making  Evolving/Moderate complexity    Clinical Decision Making  Moderate    Rehab Potential  Excellent    PT Frequency  1x / week    PT Duration  Other (comment)   16 weeks   PT Treatment/Interventions  ADLs/Self Care Home Management;Biofeedback;Electrical Stimulation;Cryotherapy;Moist Heat;Functional mobility training;Therapeutic activities;Therapeutic exercise;Patient/family education;Neuromuscular re-education;Taping;Dry needling;Manual techniques    PT Next Visit Plan  biofeedback, breathing and bulging, stretches    PT Home Exercise Plan  f/u on moisturizers    Consulted and Agree with Plan of Care   Patient       Patient will benefit from skilled therapeutic intervention in order to improve the following deficits and impairments:  Increased muscle spasms, Impaired tone, Decreased range of motion, Decreased strength, Increased fascial restricitons, Impaired flexibility, Postural dysfunction, Pain  Visit Diagnosis: Muscle weakness (generalized)  Unspecified lack of coordination     Problem List Patient Active Problem List   Diagnosis Date Noted  . Anal cancer ([place]) 06/13/2019  . Hematuria, gross 03/11/2017  . Status post total shoulder arthroplasty, right 09/12/2016  . Arthritis of knee 02/27/2015  . Primary osteoarthritis of right knee Valgus 02/24/2015  . Neuropathy 09/07/2013  . Breast cancer of upper-outer quadrant of left female breast ([place]) 08/09/2013  . Breast cancer  of upper-outer quadrant of right female breast ([place]) 08/09/2013  . Osteoarthritis of right hip 05/03/2013  . Osteoarthritis of left hip 02/15/2013    Jule Ser, PT 09/13/2019, 5:09 PM  [facility] [address] [place], [place], [address] Phone: [phone]   Fax:  [phone]  Name: Meleni Samaha MRN: [id number] Date of Birth: Apr 24, 1951

## 2019-09-08 ENCOUNTER — Telehealth: Payer: Self-pay | Admitting: *Deleted

## 2019-09-08 MED ORDER — HYOSCYAMINE SULFATE 0.125 MG PO TABS: 0.1250 mg | ORAL_TABLET | ORAL | 1 refills | Status: DC | PRN

## 2019-09-08 NOTE — Telephone Encounter (Signed)
Called Dr. Estell Harpin office to arrange fu in late Feb. for this patient to have an anoscopy, was told by scheduler that the books aren't open for next year, she asked me to call back in Jan. 2021

## 2019-09-09 ENCOUNTER — Telehealth: Payer: Self-pay | Admitting: *Deleted

## 2019-09-09 ENCOUNTER — Ambulatory Visit
Admission: RE | Admit: 2019-09-09 | Discharge: 2019-09-09 | Disposition: A | Discharge status: Home / Self Care | Payer: PPO | Source: Ambulatory Visit | Attending: Radiation Oncology | Admitting: Radiation Oncology

## 2019-09-09 ENCOUNTER — Other Ambulatory Visit: Payer: Self-pay

## 2019-09-09 DIAGNOSIS — C210 Malignant neoplasm of anus, unspecified: Secondary | ICD-10-CM | POA: Diagnosis not present

## 2019-09-09 DIAGNOSIS — C50411 Malignant neoplasm of upper-outer quadrant of right female breast: Secondary | ICD-10-CM | POA: Diagnosis not present

## 2019-09-09 DIAGNOSIS — C50412 Malignant neoplasm of upper-outer quadrant of left female breast: Secondary | ICD-10-CM | POA: Diagnosis not present

## 2019-09-09 LAB — IRON AND TIBC
Iron: 78 ug/dL (ref 41–142)
Saturation Ratios: 31 % (ref 21–57)
TIBC: 249 ug/dL (ref 236–444)
UIBC: 170 ug/dL (ref 120–384)

## 2019-09-09 LAB — FERRITIN: Ferritin: 179 ng/mL (ref 11–307)

## 2019-09-09 NOTE — Telephone Encounter (Signed)
-----   Message from Jonnie Finner, RN sent at 09/03/2019 11:32 AM EST ----- Regarding: Work Status [place],  I received a phone call from [place] with [employer] regarding needing an update on her work status (estimated return to work or continued out of work) faxed to [phone].  Her call back number [phone]  Thank you [name]

## 2019-09-09 NOTE — Telephone Encounter (Signed)
Connected with [place] who asked if Cynthia Velasquez will be returning to work (RTW) 10-11-2019.   Proposed RTW date extension estimation of 03-10-2020 for recovery time.  Provided Med Onc and XRT next apointment dates as requested along with weekly PT appointments noted through February 2021.    A Case Manager will be given this information.  C.M. may contact [place] for further information if needed.

## 2019-09-10 LAB — URINE CULTURE: Culture: <10000 — AB

## 2019-09-13 ENCOUNTER — Telehealth: Payer: Self-pay | Admitting: Radiation Oncology

## 2019-09-13 NOTE — Telephone Encounter (Signed)
Phoned patient as requested by Shona Simpson, PA-C. Initially patient answered the phone and I explained she does not have a urinary tract infection. Patient verbalized understanding. Inquired if the hyoscyamine is helping. Patient went to "find the bottle" and we got disconnected. Phoned back. No answer. Left message with direct contact number in the event medication isn't working.

## 2019-09-13 NOTE — Addendum Note (Signed)
Addended by: Su Hoff on: 09/13/2019 05:10 PM   Modules accepted: Orders

## 2019-09-21 ENCOUNTER — Encounter: Payer: Self-pay | Admitting: Physical Therapy

## 2019-09-21 ENCOUNTER — Other Ambulatory Visit: Payer: Self-pay

## 2019-09-21 ENCOUNTER — Ambulatory Visit: Payer: PPO | Attending: Hematology | Admitting: Physical Therapy

## 2019-09-21 DIAGNOSIS — M6281 Muscle weakness (generalized): Secondary | ICD-10-CM | POA: Insufficient documentation

## 2019-09-21 DIAGNOSIS — R279 Unspecified lack of coordination: Secondary | ICD-10-CM | POA: Insufficient documentation

## 2019-09-21 NOTE — Therapy (Signed)
[facility] [address] [place], [place], [address] Phone: [phone]   Fax:  [phone]  Physical Therapy Treatment  Patient Details  Name: Cynthia Velasquez MRN: [id number] Date of Birth: 08-04-1951 Referring Provider (PT): Truitt Merle, MD   Encounter Date: 09/21/2019  PT End of Session - 09/21/19 0933    Visit Number  2    Date for PT Re-Evaluation  12/28/19    PT Start Time  0931    PT Stop Time  1011    PT Time Calculation (min)  40 min    Activity Tolerance  Patient tolerated treatment well    Behavior During Therapy  [facility] for tasks assessed/performed       Past Medical History:  Diagnosis Date  . Anxiety   . Arthritis   . Bilateral breast cancer ([place]) 10/21/2013  . Breast cancer ([place])   . Cancer ([place])   . Depression   . Gallstones   . GERD (gastroesophageal reflux disease)    doesn't take any meds for this  . Headache    h/o migraines      . History of bladder infections   . History of hiatal hernia   . History of migraine   . Hyperlipidemia    takes Simvastatin daily  . Hypertension    takes Hyzaar  . Joint pain   . Joint swelling   . Lumbar stenosis   . Neuropathy 09/07/2013  . Pneumonia   . Pre-diabetes     Past Surgical History:  Procedure Laterality Date  . ABDOMINAL HYSTERECTOMY    . bone spur removed from left foot    . CHOLECYSTECTOMY    . COLONOSCOPY    . double mastectomy     . HERNIA REPAIR     umbilical  . right knee arthroscopy    . right shoulder arthroscopy    . surgery for hiatal hernia    . TONSILLECTOMY    . TOTAL HIP ARTHROPLASTY Left 02/12/2013  . TOTAL HIP ARTHROPLASTY Left 02/12/2013   Procedure: LEFT TOTAL HIP ARTHROPLASTY;  Surgeon: Kerin Salen, MD;  Location: [place];  Service: Orthopedics;  Laterality: Left;  . TOTAL HIP ARTHROPLASTY Right 05/03/2013   Procedure: TOTAL HIP ARTHROPLASTY;  Surgeon: Kerin Salen, MD;  Location: [place];  Service: Orthopedics;   Laterality: Right;  . TOTAL KNEE ARTHROPLASTY Right 02/27/2015   Procedure: TOTAL KNEE ARTHROPLASTY;  Surgeon: Frederik Pear, MD;  Location: [place];  Service: Orthopedics;  Laterality: Right;  . TOTAL SHOULDER ARTHROPLASTY Right 09/12/2016   Procedure: RIGHT TOTAL SHOULDER ARTHROPLASTY;  Surgeon: Tania Ade, MD;  Location: [place];  Service: Orthopedics;  Laterality: Right;  RIGHT TOTAL SHOULDER ARTHROPLASTY    There were no vitals filed for this visit.  Subjective Assessment - 09/21/19 0940    Subjective  I tried to use the dilator but it hurt and had some bleeding and skin irritation.    Currently in Pain?  No/denies                       [facility] Adult PT Treatment/Exercise - 09/21/19 0001      Neuro Re-ed    Neuro Re-ed Details   breathing using diaphragm      Exercises   Exercises  Lumbar      Lumbar Exercises: Stretches   Active Hamstring Stretch  Right;Left;3 reps;20 seconds    Piriformis Stretch  Right;Left;3 reps;20 seconds  Manual Therapy   Manual Therapy  Internal Pelvic Floor    Manual therapy comments  pt identity confirmed and informed consent given to perform soft tissue internally    Internal Pelvic Floor  myfascial stretch to indroitus and externally to pelvic floor all the way out to adductor attachments and adductors             PT Education - 09/21/19 1128    Education Details  stretch pelvic floor, intial HEP, moisturizers       PT Short Term Goals - 09/13/19 1219      PT SHORT TERM GOAL #1   Title  ind with initial HEP    Time  4    Period  Weeks    Status  New    Target Date  10/05/19      PT SHORT TERM GOAL #2   Title  pt will report 30% less leakage    Time  4    Period  Weeks    Status  New    Target Date  10/05/19        PT Long Term Goals - 09/07/19 0940      PT LONG TERM GOAL #1   Title  Pt will be able to reduce leakage and use only 1-2 pads/day    Baseline  5-6 pads/ day    Time  16    Period  Weeks     Status  New    Target Date  12/28/19      PT LONG TERM GOAL #2   Title  Pt will report no nocturia    Baseline  nocturia 1-2x/ night    Time  16    Period  Weeks    Status  New    Target Date  12/28/19      PT LONG TERM GOAL #3   Title  Pt will report she can feel the urge to urinate at least 90% of the time due to improved health of pelvic floor muscles    Baseline  leaks all throughout the day without any awareness of need to void    Time  16    Period  Weeks    Status  New    Target Date  12/28/19      PT LONG TERM GOAL #4   Title  Pt will report at least 60% less leakage due to improved pelvic strength    Time  16    Period  Weeks    Status  New    Target Date  12/28/19            Plan - 09/21/19 1013    Clinical Impression Statement  Pt at first treatment since eval today.  Today's session focused on self care education since she had questions about vaginal dryness and how to use the dilators.  Pt is very TTP throughout perineum even externally.  She poorly tolerated internal STM and did better with fascial release to adductors and externally to pelvic floor.  Treatment included application of releveum from desert harvest and given samples of that as well as lubricants to use with stretching the vaginal canal manually.  She was instructed to start without dilator since that irritated her when she used them at home.  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    PT Treatment/Interventions  ADLs/Self Care Home Management;Biofeedback;Electrical Stimulation;Cryotherapy;Moist Heat;Functional mobility training;Therapeutic activities;Therapeutic exercise;Patient/family education;Neuromuscular re-education;Taping;Dry needling;Manual techniques  PT Next Visit Plan  biofeedback, breathing and bulging, stretches    PT Home Exercise Plan  Access Code: [email or web address]    Consulted and Agree with Plan of Care  Patient       Patient will benefit from  skilled therapeutic intervention in order to improve the following deficits and impairments:  Increased muscle spasms, Impaired tone, Decreased range of motion, Decreased strength, Increased fascial restricitons, Impaired flexibility, Postural dysfunction, Pain  Visit Diagnosis: Muscle weakness (generalized)  Unspecified lack of coordination     Problem List Patient Active Problem List   Diagnosis Date Noted  . Anal cancer ([place]) 06/13/2019  . Hematuria, gross 03/11/2017  . Status post total shoulder arthroplasty, right 09/12/2016  . Arthritis of knee 02/27/2015  . Primary osteoarthritis of right knee Valgus 02/24/2015  . Neuropathy 09/07/2013  . Breast cancer of upper-outer quadrant of left female breast ([place]) 08/09/2013  . Breast cancer of upper-outer quadrant of right female breast ([place]) 08/09/2013  . Osteoarthritis of right hip 05/03/2013  . Osteoarthritis of left hip 02/15/2013    Jule Ser, PT 09/21/2019, 11:36 AM  [facility] [address] [place], [place], [address] Phone: [phone]   Fax:  [phone]  Name: Cynthia Velasquez MRN: [id number] Date of Birth: 1950/11/26

## 2019-09-21 NOTE — Patient Instructions (Addendum)
Moisturizers . They are used in the vagina to hydrate the mucous membrane that make up the vaginal canal. . Designed to keep a more normal acid balance (ph) . Once placed in the vagina, it will last between two to three days.  . Use 2-3 times per week at bedtime  . Ingredients to avoid is glycerin and fragrance, can increase chance of infection . Should not be used just before sex due to causing irritation . Most are gels administered either in a tampon-shaped applicator or as a vaginal suppository. They are non-hormonal.   Types of Moisturizers  . Vitamin E vaginal suppositories- Whole foods, Amazon . Moist Again . Coconut oil- can break down condoms . Julva- (Do no use if on Tamoxifen) amazon . Yes moisturizer- amazon . NeuEve Silk , NeuEve Silver for menopausal or over 65 (if have severe vaginal atrophy or cancer treatments use NeuEve Silk for  1 month than move to [employer])- [employer], MapleFlower.dk . Olive and Bee intimate cream- www.oliveandbee.com.au . Mae vaginal [place] . Aloe   Creams to use externally on the Vulva area  Albertson's (good for for cancer patients that had radiation to the area)- [place] or [employer].FlyingBasics.com.br  V-magic cream - amazon  Julva-amazon  Vital "V Wild Yam salve ( help moisturize and help with thinning vulvar area, does have [place] by Irwin Brakeman labial moisturizer ([place], Development worker, community)  Coconut or olive oil  aloe   Things to avoid in the vaginal area . Do not use things to irritate the vulvar area . No lotions just specialized creams for the vulva area- Neogyn, V-magic, No soaps; can use Aveeno or Calendula cleanser if needed. Must be gentle . No deodorants . No douches . Good to sleep without underwear to let the vaginal area to air out . No scrubbing: spread the lips to let warm water rinse over labias and pat dry  STRETCHING THE  PELVIC FLOOR MUSCLES NO DILATOR  Supplies . Vaginal lubricant . Mirror (optional) . Gloves (optional) Positioning . Start in a semi-reclined position with your head propped up. Bend your knees and place your thumb or finger at the vaginal opening. Procedure . Apply a moderate amount of lubricant on the outer skin of your vagina, the labia minora.  Apply additional lubricant to your finger. [name] Kitchen Spread the skin away from the vaginal opening. Place the end of your finger at the opening. . Do a maximum contraction of the pelvic floor muscles. Tighten the vagina and the anus maximally and relax. . When you know they are relaxed, gently and slowly insert your finger into your vagina, directing your finger slightly downward, for 2-3 inches of insertion. . Relax and stretch the 6 o'clock position . Hold each stretch for _2 min__ and repeat __1_ time with rest breaks of _1__ seconds between each stretch. . Repeat the stretching in the 4 o'clock and 8 o'clock positions. . Total time should be _6__ minutes, _1__ x per day.  Note the amount of theme your were able to achieve and your tolerance to your finger in your vagina. . Once you have accomplished the techniques you may try them in standing with one foot resting on the tub, or in other positions.  This is a good stretch to do in the shower if you don't need to use lubricant.  Access Code: [email or web address]  URL: [email or web address]  Date: 09/21/2019  Prepared by:  [facility] Oden Lindaman   Exercises Seated Hamstring Stretch - 10 reps - 3 sets - 1x daily - 7x weekly Seated Piriformis Stretch - 3 reps - 1 sets - 30 sec hold - 1x daily - 7x weekly Supine Figure 4 Piriformis Stretch - 3 reps - 1 sets - 30 sec hold - 1x daily - 7x weekly Supine Diaphragmatic Breathing with Pelvic Floor Lengthening - 10 reps - 1 sets - 3x daily - 7x weekly

## 2019-09-26 NOTE — Progress Notes (Signed)
Cynthia Velasquez   [phone] [phone]   Clinic Follow up Note   Patient Care Team: Kathyrn Lass, MD as PCP - General (Family Medicine) Wonda Horner, MD as Consulting Physician (Gastroenterology) Truitt Merle, MD as Consulting Physician (Hematology) Alla Feeling, NP as Nurse Practitioner (Nurse Practitioner) Date of service: 09/27/2019   CHIEF COMPLAINT: F/u anal cancer   SUMMARY OF ONCOLOGIC HISTORY: Oncology History Overview Note  Breast cancer of upper-outer quadrant of right female breast, (ER/PR positive, her2/Neu negative)   Primary site: Breast (Right)   Staging method: AJCC 7th Edition   Clinical: Stage IA (T1a, N0, cM0) signed by [name], MD on 09/07/2013 11:14 AM   Pathologic: Stage IA (T1a, N0, cM0) signed by [name], MD on 09/07/2013 11:14 AM   Summary: Stage IA (T1a, N0, cM0)  Breast cancer of upper-outer quadrant of left female breast (ER/PR/Her2Neu negative)   Primary site: Breast (Left)   Staging method: AJCC 7th Edition   Clinical: Stage IIA (T2, N0, cM0) signed by [name], MD on 09/07/2013 11:35 AM   Pathologic: Stage IIA (T2, N0, cM0) signed by [name], MD on 09/07/2013 11:35 AM   Summary: Stage IIA (T2, N0, cM0)     Breast cancer of upper-outer quadrant of left female breast ([place])  07/10/2007 Procedure   US biopsy showed invasive ductal carcinoma 0.8cm, Nottingham grade 3   07/30/2007 Surgery   She had left breast lumpectomy and LN biopsy which showed high grade invasive ductal cancer Nottingham grade 3, 2.7 cm, ER/PR/Her2 neu negative   11/24/2007 Surgery   Patient elected for bilateral mastectomy   09/07/2008 - 03/08/2009 Chemotherapy   dates are approximate: she received adriamycin and cytoxan followed by Taxol   03/08/2009 - 05/08/2009 Radiation Therapy   dates are approximate, she received XRT    Breast cancer of upper-outer quadrant of right female breast ([place])  07/10/2006 Procedure   stereotactic  biopsy showed atypical hyperplasia   10/23/2006 Surgery   right lumpectomy showed invasive ductal ca (Nottingham grade 1)  56m and DCIS, ER/PR positive Her 2 negative   10/09/2007 - 10/08/2012 Chemotherapy   Patient was placed on Tamoxifen   03/24/2017 Imaging   No acute findings. No evidence of recurrent carcinoma or metastatic disease   Anal cancer ([place]  06/04/2019 Procedure   Colonoscopy per Dr. SAnson Fret Findings-the digital rectal exam revealed a 3 cm diameter firm rectal mass.  The mass was noncircumferential and located predominantly at the right bowel wall at the anorectal junction. A nonobstructing mass was found at the anus and in the rectum    06/04/2019 Initial Biopsy   Follow pathology: Large intestine, rectum biopsy: Invasive well to moderately differentiated squamous cell carcinoma.  No rectal mucosa present.  There is strong diffuse expression of P 16 immunostain.  CDX 2, p63 and mCEA immunostains are also used in the diagnostic work-up of the case.   06/04/2019 Initial Diagnosis   Anal cancer ([place]   06/21/2019 PET scan   IMPRESSION: 1. Anorectal primary. No hypermetabolic metastatic disease within the chest, abdomen, or pelvis. Perirectal nodes, at least 1 of which is new since 02/26/2018 CT, suspicious based on size and interval development. 2. Mild limitations secondary to beam hardening artifact from bilateral hip arthroplasty. 3.  Aortic Atherosclerosis (ICD10-I70.0).   06/28/2019 Cancer Staging   Staging form: Anus, AJCC 8th Edition - Clinical stage from 06/28/2019: Stage IIA (cT2, cN0, cM0) - Signed by BAlla Feeling NP on  06/28/2019   06/28/2019 - 07/26/2019 Chemotherapy   concurrent chemoRT with Mitomycin and 5FU on week 1 and week 5 on starting 06/28/19. Last dose on 07/26/19   06/28/2019 - 08/09/2019 Radiation Therapy   concurrent chemoRT with Dr [name] 06/28/19-08/09/19     CURRENT THERAPY: S/p concurrent chemoRT 06/28/19 - 08/09/19 mitomycin and  5FU  INTERVAL HISTORY: Cynthia Velasquez returns for f/u as scheduled. She continues to have rectal pain if she sits longer than 15 mins, has to reposition frequently. Takes tramadol very rarely. Thinks pain is slowly getting better since treatment. She has recurrent constipation that resolved after chemoRT and returned 3-4 weeks ago. She takes linzess. Last BM 5 days ago. Has rectal mucus discharge. Eats once per day to avoid "bowel issues." Had blood in stool for 2 days 2 weeks ago, resolved. She notes abdominal bloating which she dealt with before diagnosis and has come back. She had a hernia repair historically, and has to strain with BMs. She has regular nausea, takes zofran sparingly. Also had a migraine and took zofran for this. She thinks nausea might be related to not eating much. Continues to have dysuria at first then resolves during stream and low urine output. She is doing PT and using vaginal dilator and lubricant, also takes Pyridium which helps. She has low back pain, which she had before and was told she had arthritis. This is not new pain. Takes NSAIDs prn. Her fatigue got better initially after chemo, but increased over last 3-4 weeks. She is able to be out of bed, but requires long rest periods. No recent fever, chills, cough, chest pain, dyspnea, infection. She does not see a Psychologist, sport and exercise.    MEDICAL HISTORY:  Past Medical History:  Diagnosis Date  . Anxiety   . Arthritis   . Bilateral breast cancer ([place]) 10/21/2013  . Breast cancer ([place])   . Cancer ([place])   . Depression   . Gallstones   . GERD (gastroesophageal reflux disease)    doesn't take any meds for this  . Headache    h/o migraines      . History of bladder infections   . History of hiatal hernia   . History of migraine   . Hyperlipidemia    takes Simvastatin daily  . Hypertension    takes Hyzaar  . Joint pain   . Joint swelling   . Lumbar stenosis   . Neuropathy 09/07/2013  . Pneumonia   . Pre-diabetes     SURGICAL  HISTORY: Past Surgical History:  Procedure Laterality Date  . ABDOMINAL HYSTERECTOMY    . bone spur removed from left foot    . CHOLECYSTECTOMY    . COLONOSCOPY    . double mastectomy     . HERNIA REPAIR     umbilical  . right knee arthroscopy    . right shoulder arthroscopy    . surgery for hiatal hernia    . TONSILLECTOMY    . TOTAL HIP ARTHROPLASTY Left 02/12/2013  . TOTAL HIP ARTHROPLASTY Left 02/12/2013   Procedure: LEFT TOTAL HIP ARTHROPLASTY;  Surgeon: Kerin Salen, MD;  Location: [facility];  Service: Orthopedics;  Laterality: Left;  . TOTAL HIP ARTHROPLASTY Right 05/03/2013   Procedure: TOTAL HIP ARTHROPLASTY;  Surgeon: Kerin Salen, MD;  Location: [facility];  Service: Orthopedics;  Laterality: Right;  . TOTAL KNEE ARTHROPLASTY Right 02/27/2015   Procedure: TOTAL KNEE ARTHROPLASTY;  Surgeon: Frederik Pear, MD;  Location: [facility];  Service: Orthopedics;  Laterality: Right;  .  TOTAL SHOULDER ARTHROPLASTY Right 09/12/2016   Procedure: RIGHT TOTAL SHOULDER ARTHROPLASTY;  Surgeon: Tania Ade, MD;  Location: [facility];  Service: Orthopedics;  Laterality: Right;  RIGHT TOTAL SHOULDER ARTHROPLASTY    I have reviewed the social history and family history with the patient and they are unchanged from previous note.  ALLERGIES:  is allergic to oxycodone-acetaminophen; codeine; and vicodin [hydrocodone-acetaminophen].  MEDICATIONS:  Current Outpatient Medications  Medication Sig Dispense Refill  . benzonatate (TESSALON) 100 MG capsule Take 1-2 capsules (100-200 mg total) by mouth 3 (three) times daily as needed for cough. 40 capsule 0  . hyoscyamine (LEVSIN) 0.125 MG tablet Take 1 tablet (0.125 mg total) by mouth every 4 (four) hours as needed. 30 tablet 1  . LINZESS 72 MCG capsule     . losartan (COZAAR) 100 MG tablet TK 1 T PO D  6  . phenazopyridine (PYRIDIUM) 200 MG tablet Take 1 tablet (200 mg total) by mouth 3 (three) times daily as needed for pain. 30 tablet 0  . PROAIR HFA 108 (90 Base)  MCG/ACT inhaler 2 inhalations every 4-6 hours as needed. (Patient taking differently: Inhale 2 puffs into the lungs every 6 (six) hours as needed for wheezing or shortness of breath. ) 18 g 1  . rosuvastatin (CRESTOR) 40 MG tablet Take 40 mg by mouth daily.    . traMADol (ULTRAM) 50 MG tablet Take 1-2 tablets (50-100 mg total) by mouth every 6 (six) hours as needed for severe pain. 45 tablet 0  . ciprofloxacin (CIPRO) 500 MG tablet Take 1 tablet (500 mg total) by mouth daily. 3 tablet 0  . fluconazole (DIFLUCAN) 100 MG tablet Take one tablet po on 07/12/2019, then second dose on 07/14/2019 2 tablet 0  . HYDROmorphone (DILAUDID) 2 MG tablet Take 0.5-1 tablets (1-2 mg total) by mouth every 4 (four) hours as needed for severe pain. 60 tablet 0  . ondansetron (ZOFRAN ODT) 4 MG disintegrating tablet Take 1 tablet (4 mg total) by mouth every 8 (eight) hours as needed for nausea or vomiting. 6 tablet 0  . potassium chloride 20 MEQ/15ML (10%) SOLN Take 15 mLs (20 mEq total) by mouth 2 (two) times daily. 473 mL 0   No current facility-administered medications for this visit.    PHYSICAL EXAMINATION: ECOG PERFORMANCE STATUS: 2 - Symptomatic, <50% confined to bed  Vitals:   09/27/19 0904  BP: 135/78  Pulse: 75  Resp: 17  Temp: 98.3 F (36.8 C)  SpO2: 99%   Filed Weights   09/27/19 0904  Weight: 226 lb (102.5 kg)    GENERAL:alert, no distress and comfortable SKIN: no rash EYES: sclera clear LYMPH:  no palpable cervical, supraclavicular, or inguinal lymphadenopathy LUNGS: clear with normal breathing effort HEART: regular rate & rhythm, no lower extremity edema ABDOMEN: abdomen soft with normal bowel sounds. Mild umbilical tenderness. No palpable masses Rectal exam: external exam shows resolving hyperpigmentation to gluteal cleft. No ulceration or open wound. No palpable mass in anus or low rectum. No blood on exam glove. Exam limited by pain and body habitus  Musculoskeletal: moderate  tenderness to palpation in low back/sacrum NEURO: alert & oriented x 3 with fluent speech, no focal motor/sensory deficits  LABORATORY DATA:  I have reviewed the data as listed CBC Latest Ref Rng & Units 09/27/2019 08/20/2019 08/09/2019  WBC 4.0 - 10.5 K/uL 3.2(L) 2.7(L) 2.2(L)  Hemoglobin 12.0 - 15.0 g/dL 10.6(L) 11.2(L) 11.2(L)  Hematocrit 36.0 - 46.0 % 33.8(L) 35.6(L) 34.6(L)  Platelets 150 -  400 K/uL 129(L) 111(L) 107(L)     CMP Latest Ref Rng & Units 09/27/2019 08/20/2019 08/09/2019  Glucose 70 - 99 mg/dL 85 111(H) 110(H)  BUN 8 - 23 mg/dL 14 12 7(L)  Creatinine 0.44 - 1.00 mg/dL 0.98 0.94 0.95  Sodium 135 - 145 mmol/L 142 144 144  Potassium 3.5 - 5.1 mmol/L 3.8 3.5 2.9(LL)  Chloride 98 - 111 mmol/L 112(H) 108 108  CO2 22 - 32 mmol/L '23 27 26  ' Calcium 8.9 - 10.3 mg/dL 8.7(L) 8.4(L) 8.1(L)  Total Protein 6.5 - 8.1 g/dL 6.5 6.5 6.1(L)  Total Bilirubin 0.3 - 1.2 mg/dL 0.3 0.5 0.5  Alkaline Phos 38 - 126 U/L 71 81 74  AST 15 - 41 U/L 25 37 32  ALT 0 - 44 U/L '18 30 29      ' RADIOGRAPHIC STUDIES: I have personally reviewed the radiological images as listed and agreed with the findings in the report. No results found.   ASSESSMENT & PLAN:   1. Anal Squamous Cell Carcinoma, cT2N0 clinical stage IIA -She was diagnosed in 05/2019.She presented with rectal bleeding and anal pressure.Biopsy showeda 3 cm mass at the anorectal junction, path confirmedwell to moderately differentiated squamous cell carcinoma.10/5/20PET scan shows no metastasis. -S/p curative intent chemoRT with mitomycin and 5FU completed on 08/09/19 -her rectal pain is slowly improving. Rectal bleeding and constipation resolved after chemoRT but recurred over last 3-4 weeks with worsening fatigue and recurrent constipation. We reviewed symptom management. She is not eating well, constipation may be due to inadequate nutrition. I encouraged her to eat and drink more. Weight is stable.  -labs improving after chemoRT,  iron studies are adequate. My limited exam shows no palpable mass today.  -I recommend to consider getting the covid19 vaccine when it becomes available to her. She will need to wait on shingles vaccine due to recent chemotherapy  -f/u in 4 weeks   2. Rectal pain, rectal bleeding, constipation  -she presented with rectal pain, bleeding and constipation. Bleeding and constipation resolved after she completed chemoRT. Rectal pain is present but slowly improving.  -over last 3-4 weeks she has recurrent constipation and 1 episode of rectal bleeding, last BM in 5 days. She is not eating much to avoid difficulty with bowels -my limited exam showed no palpable mass or blood on exam glove. She has not had anoscopy and never saw a Psychologist, sport and exercise.  -continue to improve nutrition and increase laxative PRN  3. Low/sacral back pain -she reports this is chronic issue that comes and goes -she has significant tenderness to touch in low back near sacrum. No palpable abnormality. She notes this is not new for her.  -unclear if this represents new process or flare from recent radiation.  -she does not require medication.  -will watch closely.   4. H/o bilateral breast cancer 2007: right breast biopsy showed atypical hyperplasia, s/p right lumpectomy in 06/2006 ER/PR+/HER2-, grade 1. Completed tamoxifen from 09/2007 - 09/2012 2008: left breast invasive ductal carcinoma, triple negative, grade 3 s/p lumpectomy in 07/2007 and eventual bilateral mastectomy; s/p adjuvant chemo AC-T and radiation   5. Genetics -patient is adopted, no known family history  -she reportedly had genetic testing which was negative. Her daughter also negative. I do not have the report.  6. HTN -on losartan, stable   7. Pancytopenia  -secondary to chemo, and iron deficiency. -continues to improve -iron studies are adequate   8. UTI, dysuria, Low urine output -UA on 11/18 shows positive nitrites, trace leukocytes,  11-20 WBC and  bacteria. S/p cipro x3 days. Repeat UA 08/09/19 was negative -dysuria is likely related to radiation irritation, improved with pyridium and pelvic PT  PLAN: -Labs reviewed -Continue recovery from chemoRT -Focus on nutrition, mobility, symptom management for constipation -F/u in 4 weeks, order PET at next visit  -referral to surgery for anoscopy  All questions were answered. The patient knows to call the clinic with any problems, questions or concerns. No barriers to learning was detected. I spent 20 minutes counseling the patient face to face. The total time spent in the appointment was 25 minutes and more than 50% was on counseling and review of test results     Alla Feeling, NP 09/27/2019  Addendum: I called Cynthia Velasquez later in the week to see how she's doing. Her BMs improved when she started Linzess. She had BM 1/13 and two today. Her fatigue continues to come in waves, but she manages it. We discussed anal cancer surveillance. I recommend referral to colorectal surgery for anoscopy to further evaluate her local response to therapy given my limited exam. Will hold PET scan until at least 3 months after chemoRT. She agrees with the plan. She knows to call with new or worsening concerns in the interval.   Cira Rue, NP  09/30/2019   Orders Placed This Encounter  Procedures  . Ambulatory referral to General Surgery    Referral Priority:   Routine    Referral Type:   Surgical    Referral Reason:   Specialty Services Required    Requested Specialty:   General Surgery    Number of Visits Requested:   1

## 2019-09-27 ENCOUNTER — Inpatient Hospital Stay (HOSPITAL_BASED_OUTPATIENT_CLINIC_OR_DEPARTMENT_OTHER): Payer: PPO | Admitting: Nurse Practitioner

## 2019-09-27 ENCOUNTER — Inpatient Hospital Stay: Payer: PPO | Attending: Hematology

## 2019-09-27 ENCOUNTER — Other Ambulatory Visit: Payer: Self-pay

## 2019-09-27 VITALS — BP 135/78 | HR 75 | Temp 98.3°F | Resp 17 | Ht 65.0 in | Wt 226.0 lb

## 2019-09-27 DIAGNOSIS — Z853 Personal history of malignant neoplasm of breast: Secondary | ICD-10-CM | POA: Insufficient documentation

## 2019-09-27 DIAGNOSIS — M545 Low back pain: Secondary | ICD-10-CM | POA: Diagnosis not present

## 2019-09-27 DIAGNOSIS — Z923 Personal history of irradiation: Secondary | ICD-10-CM | POA: Diagnosis not present

## 2019-09-27 DIAGNOSIS — I10 Essential (primary) hypertension: Secondary | ICD-10-CM | POA: Insufficient documentation

## 2019-09-27 DIAGNOSIS — D61810 Antineoplastic chemotherapy induced pancytopenia: Secondary | ICD-10-CM | POA: Diagnosis not present

## 2019-09-27 DIAGNOSIS — K6289 Other specified diseases of anus and rectum: Secondary | ICD-10-CM | POA: Insufficient documentation

## 2019-09-27 DIAGNOSIS — Z9013 Acquired absence of bilateral breasts and nipples: Secondary | ICD-10-CM | POA: Insufficient documentation

## 2019-09-27 DIAGNOSIS — C210 Malignant neoplasm of anus, unspecified: Secondary | ICD-10-CM | POA: Diagnosis not present

## 2019-09-27 LAB — CMP (CANCER CENTER ONLY)
ALT: 18 U/L (ref 0–44)
AST: 25 U/L (ref 15–41)
Albumin: 3.3 g/dL — ABNORMAL LOW (ref 3.5–5.0)
Alkaline Phosphatase: 71 U/L (ref 38–126)
Anion gap: 7 (ref 5–15)
BUN: 14 mg/dL (ref 8–23)
CO2: 23 mmol/L (ref 22–32)
Calcium: 8.7 mg/dL — ABNORMAL LOW (ref 8.9–10.3)
Chloride: 112 mmol/L — ABNORMAL HIGH (ref 98–111)
Creatinine: 0.98 mg/dL (ref 0.44–1.00)
GFR, Est AFR Am: >60 mL/min (ref >60)
GFR, Estimated: 59 mL/min — ABNORMAL LOW (ref >60)
Glucose, Bld: 85 mg/dL (ref 70–99)
Potassium: 3.8 mmol/L (ref 3.5–5.1)
Sodium: 142 mmol/L (ref 135–145)
Total Bilirubin: 0.3 mg/dL (ref 0.3–1.2)
Total Protein: 6.5 g/dL (ref 6.5–8.1)

## 2019-09-27 LAB — CBC WITH DIFFERENTIAL (CANCER CENTER ONLY)
Abs Immature Granulocytes: 0.01 10*3/uL (ref 0.00–0.07)
Basophils Absolute: 0 10*3/uL (ref 0.0–0.1)
Basophils Relative: 1 %
Eosinophils Absolute: 0.1 10*3/uL (ref 0.0–0.5)
Eosinophils Relative: 3 %
HCT: 33.8 % — ABNORMAL LOW (ref 36.0–46.0)
Hemoglobin: 10.6 g/dL — ABNORMAL LOW (ref 12.0–15.0)
Immature Granulocytes: 0 %
Lymphocytes Relative: 24 %
Lymphs Abs: 0.8 10*3/uL (ref 0.7–4.0)
MCH: 29.6 pg (ref 26.0–34.0)
MCHC: 31.4 g/dL (ref 30.0–36.0)
MCV: 94.4 fL (ref 80.0–100.0)
Monocytes Absolute: 0.6 10*3/uL (ref 0.1–1.0)
Monocytes Relative: 19 %
Neutro Abs: 1.7 10*3/uL (ref 1.7–7.7)
Neutrophils Relative %: 53 %
Platelet Count: 129 10*3/uL — ABNORMAL LOW (ref 150–400)
RBC: 3.58 MIL/uL — ABNORMAL LOW (ref 3.87–5.11)
RDW: 19.4 % — ABNORMAL HIGH (ref 11.5–15.5)
WBC Count: 3.2 10*3/uL — ABNORMAL LOW (ref 4.0–10.5)
nRBC: 0 % (ref 0.0–0.2)

## 2019-09-27 LAB — IRON AND TIBC
Iron: 58 ug/dL (ref 41–142)
Saturation Ratios: 27 % (ref 21–57)
TIBC: 213 ug/dL — ABNORMAL LOW (ref 236–444)
UIBC: 154 ug/dL (ref 120–384)

## 2019-09-27 LAB — FERRITIN: Ferritin: 134 ng/mL (ref 11–307)

## 2019-09-28 ENCOUNTER — Ambulatory Visit: Payer: PPO | Admitting: Physical Therapy

## 2019-09-28 ENCOUNTER — Telehealth: Payer: Self-pay | Admitting: Nurse Practitioner

## 2019-09-28 DIAGNOSIS — R0981 Nasal congestion: Secondary | ICD-10-CM | POA: Diagnosis not present

## 2019-09-28 NOTE — Telephone Encounter (Signed)
Scheduled appt per 1/11 los.  Left a vm of the appt date and time,

## 2019-09-29 DIAGNOSIS — Z03818 Encounter for observation for suspected exposure to other biological agents ruled out: Secondary | ICD-10-CM | POA: Diagnosis not present

## 2019-09-29 DIAGNOSIS — R0981 Nasal congestion: Secondary | ICD-10-CM | POA: Diagnosis not present

## 2019-09-30 ENCOUNTER — Encounter: Payer: Self-pay | Admitting: Nurse Practitioner

## 2019-10-05 ENCOUNTER — Encounter: Payer: PPO | Admitting: Physical Therapy

## 2019-10-08 ENCOUNTER — Telehealth: Payer: Self-pay

## 2019-10-08 NOTE — Telephone Encounter (Signed)
I received a phone call from [facility] with [facility] regarding needing a copy of yesterday's office visit faxed to [phone].  Her call back number [phone]  Thank you  Tollie Pizza

## 2019-10-11 ENCOUNTER — Telehealth: Payer: Self-pay

## 2019-10-11 NOTE — Telephone Encounter (Signed)
Hi Santiago Glad,       This request was noted this morning however just noted request is for office note.   H.I.M. (Health information management) manages record requests.  Documentation of record release in Media Tab or send requests to H.I.M Pool "[facility] H.I.M. 1".  A signed H.I.P.A.A authorized release form must be on file.  I added H.I.M. to this message.  Best regards, Mackie Pai

## 2019-10-11 NOTE — Telephone Encounter (Signed)
OV note from 09/27/2019 faxed to [employer] at [phone]

## 2019-10-12 ENCOUNTER — Ambulatory Visit: Payer: PPO | Admitting: Nurse Practitioner

## 2019-10-14 ENCOUNTER — Encounter: Payer: Self-pay | Admitting: Nurse Practitioner

## 2019-10-15 ENCOUNTER — Encounter: Payer: Self-pay | Admitting: Physical Therapy

## 2019-10-15 ENCOUNTER — Other Ambulatory Visit: Payer: Self-pay

## 2019-10-15 ENCOUNTER — Ambulatory Visit: Payer: PPO | Admitting: Physical Therapy

## 2019-10-15 DIAGNOSIS — R279 Unspecified lack of coordination: Secondary | ICD-10-CM

## 2019-10-15 DIAGNOSIS — M6281 Muscle weakness (generalized): Secondary | ICD-10-CM | POA: Diagnosis not present

## 2019-10-15 NOTE — Therapy (Signed)
[facility] [address] [place], [place], [address] Phone: [phone]   Fax:  [phone]  Physical Therapy Treatment  Patient Details  Name: Cynthia Velasquez MRN: [id number] Date of Birth: [birth year] Referring Provider (PT): Truitt Merle, MD   Encounter Date: 10/15/2019  PT End of Session - 10/15/19 0946    Visit Number  3    Date for PT Re-Evaluation  12/28/19    PT Start Time  0933    PT Stop Time  1012    PT Time Calculation (min)  39 min    Activity Tolerance  Patient tolerated treatment well    Behavior During Therapy  [facility] for tasks assessed/performed       Past Medical History:  Diagnosis Date  . Anxiety   . Arthritis   . Bilateral breast cancer ([place]) 10/21/2013  . Breast cancer ([place])   . Cancer ([place])   . Depression   . Gallstones   . GERD (gastroesophageal reflux disease)    doesn't take any meds for this  . Headache    h/o migraines      . History of bladder infections   . History of hiatal hernia   . History of migraine   . Hyperlipidemia    takes Simvastatin daily  . Hypertension    takes Hyzaar  . Joint pain   . Joint swelling   . Lumbar stenosis   . Neuropathy 09/07/2013  . Pneumonia   . Pre-diabetes     Past Surgical History:  Procedure Laterality Date  . ABDOMINAL HYSTERECTOMY    . bone spur removed from left foot    . CHOLECYSTECTOMY    . COLONOSCOPY    . double mastectomy     . HERNIA REPAIR     umbilical  . right knee arthroscopy    . right shoulder arthroscopy    . surgery for hiatal hernia    . TONSILLECTOMY    . TOTAL HIP ARTHROPLASTY Left 02/12/2013  . TOTAL HIP ARTHROPLASTY Left 02/12/2013   Procedure: LEFT TOTAL HIP ARTHROPLASTY;  Surgeon: Kerin Salen, MD;  Location: [facility];  Service: Orthopedics;  Laterality: Left;  . TOTAL HIP ARTHROPLASTY Right 05/03/2013   Procedure: TOTAL HIP ARTHROPLASTY;  Surgeon: Kerin Salen, MD;  Location: [facility];  Service: Orthopedics;   Laterality: Right;  . TOTAL KNEE ARTHROPLASTY Right 02/27/2015   Procedure: TOTAL KNEE ARTHROPLASTY;  Surgeon: Frederik Pear, MD;  Location: [facility];  Service: Orthopedics;  Laterality: Right;  . TOTAL SHOULDER ARTHROPLASTY Right 09/12/2016   Procedure: RIGHT TOTAL SHOULDER ARTHROPLASTY;  Surgeon: Tania Ade, MD;  Location: [facility];  Service: Orthopedics;  Laterality: Right;  RIGHT TOTAL SHOULDER ARTHROPLASTY    There were no vitals filed for this visit.  Subjective Assessment - 10/15/19 0937    Subjective  Pt states she is going to see a surgeon because she had recurrance of the anal mass.  States she is still having a lot of burning with urination.  Sitting down is better and not as uncomfortable    Currently in Pain?  No/denies                       [facility] Adult PT Treatment/Exercise - 10/15/19 0001      Lumbar Exercises: Stretches   Other Lumbar Stretch Exercise  butterfly stretch      Lumbar Exercises: Supine   AB Set Limitations  kegel in hooklying - 2 sec  hold and       Manual Therapy   Manual Therapy  Internal Pelvic Floor;Myofascial release    Manual therapy comments  pt identity confirmed and informed consent given to perform soft tissue internally    Myofascial Release  bladder and vaginal canal release technique using 2 hands on abdomen - fascial pull in all directions; pt has tender nodule just below the surface of the skin on Rt side just above low abdominal incision    Internal Pelvic Floor  myfascial stretch to indroitus and externally to pelvic floor all the way out to adductor attachments and adductors; vaginal canal entrance fascial release with q-tip             PT Education - 10/15/19 1039    Education Details  Access Code: [email or web address]    Person(s) Educated  Patient    Methods  Explanation;Demonstration;Verbal cues;Handout    Comprehension  Verbalized understanding;Returned demonstration       PT Short Term Goals - 10/15/19 1021      PT  SHORT TERM GOAL #1   Title  ind with initial HEP    Status  Achieved      PT SHORT TERM GOAL #2   Title  pt will report 30% less leakage    Status  On-going        PT Long Term Goals - 10/15/19 1021      PT LONG TERM GOAL #1   Title  Pt will be able to reduce leakage and use only 1-2 pads/day    Baseline  don't have the sensation, same    Status  On-going      PT LONG TERM GOAL #2   Title  Pt will report no nocturia    Baseline  leakage at night    Status  On-going      PT LONG TERM GOAL #3   Title  Pt will report she can feel the urge to urinate at least 90% of the time due to improved health of pelvic floor muscles    Baseline  leaks all throughout the day without any awareness of need to void    Status  On-going      PT LONG TERM GOAL #4   Title  Pt will report at least 60% less leakage due to improved pelvic strength    Baseline  still occurs    Status  On-going            Plan - 10/15/19 1040    Clinical Impression Statement  Pt states she is feeling a little relief, but still cannot sense when she will go to the bathroom.  Pt was able to tolerate soft tissue work a little deeper into pelvic floor  Pt has very irritated nerve endings and is benefitting from focus on soft tissue and myofascial release at this time.  Pt was able to perform weak squeeze and relax.  She was educated on adding a few contract and relax througout the day in order to improve strength and sensation to the muscles in the pelvic floor.  She will benefit from skilled PT to continue to progress for imrpoved pelvic floor functoin.    PT Treatment/Interventions  ADLs/Self Care Home Management;Biofeedback;Electrical Stimulation;Cryotherapy;Moist Heat;Functional mobility training;Therapeutic activities;Therapeutic exercise;Patient/family education;Neuromuscular re-education;Taping;Dry needling;Manual techniques    PT Next Visit Plan  f/u on bladder release, internal STM and using frozen coconut oil for  moisturizers    PT Home Exercise Plan  Access Code: [email or web address]  Consulted and Agree with Plan of Care  Patient       Patient will benefit from skilled therapeutic intervention in order to improve the following deficits and impairments:  Increased muscle spasms, Impaired tone, Decreased range of motion, Decreased strength, Increased fascial restricitons, Impaired flexibility, Postural dysfunction, Pain  Visit Diagnosis: Muscle weakness (generalized)  Unspecified lack of coordination     Problem List Patient Active Problem List   Diagnosis Date Noted  . Anal cancer ([place]) 06/13/2019  . Hematuria, gross 03/11/2017  . Status post total shoulder arthroplasty, right 09/12/2016  . Arthritis of knee 02/27/2015  . Primary osteoarthritis of right knee Valgus 02/24/2015  . Neuropathy 09/07/2013  . Breast cancer of upper-outer quadrant of left female breast ([place]) 08/09/2013  . Breast cancer of upper-outer quadrant of right female breast ([place]) 08/09/2013  . Osteoarthritis of right hip 05/03/2013  . Osteoarthritis of left hip 02/15/2013    Jule Ser, PT 10/15/2019, 10:49 AM  [facility] [address], [place], [place], [address] Phone: [phone]   Fax:  [phone]  Name: Cynthia Velasquez MRN: [id number] Date of Birth: August 16, 1951

## 2019-10-15 NOTE — Patient Instructions (Signed)
Access Code: [email or web address]  URL: [email or web address]  Date: 10/15/2019  Prepared by: Jari Favre   Exercises Seated Hamstring Stretch - 10 reps - 3 sets - 1x daily - 7x weekly Seated Piriformis Stretch - 3 reps - 1 sets - 30 sec hold - 1x daily - 7x weekly Supine Figure 4 Piriformis Stretch - 3 reps - 1 sets - 30 sec hold - 1x daily - 7x weekly Supine Diaphragmatic Breathing with Pelvic Floor Lengthening - 10 reps - 1 sets - 3x daily - 7x weekly Supine Butterfly Groin Stretch - 3 reps - 1 sets - 30 sec hold - 1x daily - 7x weekly Supine Pelvic Floor Contraction - 10 reps - 1 sets - 2 sec hold - 3x daily - 7x weekly

## 2019-10-19 ENCOUNTER — Encounter: Payer: Self-pay | Admitting: Physical Therapy

## 2019-10-19 ENCOUNTER — Telehealth: Payer: Self-pay | Admitting: Nurse Practitioner

## 2019-10-19 ENCOUNTER — Other Ambulatory Visit: Payer: Self-pay

## 2019-10-19 ENCOUNTER — Ambulatory Visit: Payer: PPO | Attending: Hematology | Admitting: Physical Therapy

## 2019-10-19 DIAGNOSIS — M6281 Muscle weakness (generalized): Secondary | ICD-10-CM

## 2019-10-19 DIAGNOSIS — R279 Unspecified lack of coordination: Secondary | ICD-10-CM

## 2019-10-19 DIAGNOSIS — C210 Malignant neoplasm of anus, unspecified: Secondary | ICD-10-CM

## 2019-10-19 NOTE — Therapy (Signed)
[facility] [address] [place], [place], [address] Phone: [phone]   Fax:  [phone]  Physical Therapy Treatment  Patient Details  Name: Quinita Hook MRN: [id number] Date of Birth: Feb 11, 1951 Referring Provider (PT): Truitt Merle, MD   Encounter Date: 10/19/2019  PT End of Session - 10/19/19 0931    Visit Number  4    Date for PT Re-Evaluation  12/28/19    PT Start Time  0929    PT Stop Time  1013    PT Time Calculation (min)  44 min    Activity Tolerance  Patient tolerated treatment well    Behavior During Therapy  [facility] for tasks assessed/performed       Past Medical History:  Diagnosis Date  . Anxiety   . Arthritis   . Bilateral breast cancer ([place]) 10/21/2013  . Breast cancer ([place])   . Cancer ([place])   . Depression   . Gallstones   . GERD (gastroesophageal reflux disease)    doesn't take any meds for this  . Headache    h/o migraines      . History of bladder infections   . History of hiatal hernia   . History of migraine   . Hyperlipidemia    takes Simvastatin daily  . Hypertension    takes Hyzaar  . Joint pain   . Joint swelling   . Lumbar stenosis   . Neuropathy 09/07/2013  . Pneumonia   . Pre-diabetes     Past Surgical History:  Procedure Laterality Date  . ABDOMINAL HYSTERECTOMY    . bone spur removed from left foot    . CHOLECYSTECTOMY    . COLONOSCOPY    . double mastectomy     . HERNIA REPAIR     umbilical  . right knee arthroscopy    . right shoulder arthroscopy    . surgery for hiatal hernia    . TONSILLECTOMY    . TOTAL HIP ARTHROPLASTY Left 02/12/2013  . TOTAL HIP ARTHROPLASTY Left 02/12/2013   Procedure: LEFT TOTAL HIP ARTHROPLASTY;  Surgeon: Kerin Salen, MD;  Location: [place];  Service: Orthopedics;  Laterality: Left;  . TOTAL HIP ARTHROPLASTY Right 05/03/2013   Procedure: TOTAL HIP ARTHROPLASTY;  Surgeon: Kerin Salen, MD;  Location: [place];  Service: Orthopedics;   Laterality: Right;  . TOTAL KNEE ARTHROPLASTY Right 02/27/2015   Procedure: TOTAL KNEE ARTHROPLASTY;  Surgeon: Frederik Pear, MD;  Location: [place];  Service: Orthopedics;  Laterality: Right;  . TOTAL SHOULDER ARTHROPLASTY Right 09/12/2016   Procedure: RIGHT TOTAL SHOULDER ARTHROPLASTY;  Surgeon: Tania Ade, MD;  Location: [place];  Service: Orthopedics;  Laterality: Right;  RIGHT TOTAL SHOULDER ARTHROPLASTY    There were no vitals filed for this visit.  Subjective Assessment - 10/19/19 0935    Subjective  I have discomfort in the vagina when sitting because of that mass.  I have a discomfort in the urethra when urinating and also just all the time.  States she was a little better after previous visit.    Currently in Pain?  No/denies                       [facility] Adult PT Treatment/Exercise - 10/19/19 0001      Lumbar Exercises: Seated   Other Seated Lumbar Exercises  UE horizontal abcution with core engaged      Lumbar Exercises: Supine   Ab Set  10 reps  Bent Knee Raise  10 reps      Manual Therapy   Manual therapy comments  pt identity confirmed and informed consent given to perform soft tissue internally    Myofascial Release  adductors    Internal Pelvic Floor  used julva cream sample; myfascial stretch to indroitus and externally to pelvic floor all the way out to adductor attachments and adductors; vaginal canal entrance fascial release with q-tip               PT Short Term Goals - 10/15/19 1021      PT SHORT TERM GOAL #1   Title  ind with initial HEP    Status  Achieved      PT SHORT TERM GOAL #2   Title  pt will report 30% less leakage    Status  On-going        PT Long Term Goals - 10/15/19 1021      PT LONG TERM GOAL #1   Title  Pt will be able to reduce leakage and use only 1-2 pads/day    Baseline  don't have the sensation, same    Status  On-going      PT LONG TERM GOAL #2   Title  Pt will report no nocturia    Baseline   leakage at night    Status  On-going      PT LONG TERM GOAL #3   Title  Pt will report she can feel the urge to urinate at least 90% of the time due to improved health of pelvic floor muscles    Baseline  leaks all throughout the day without any awareness of need to void    Status  On-going      PT LONG TERM GOAL #4   Title  Pt will report at least 60% less leakage due to improved pelvic strength    Baseline  still occurs    Status  On-going            Plan - 10/19/19 1014    Clinical Impression Statement  Pt had good response from fascial release and no increased pain.  PT observes a small red bump on inferior labia majora on Lt side.  Pt states she noticed it after she had leakage the other day.  Pt is still very irritated and has soft tissue adhesions around the vulva which loosened during myofascial release today.  Pt adding core strength to HEP today.    Comorbidities  chemo, radiation, hx of breast cancer    PT Treatment/Interventions  ADLs/Self Care Home Management;Biofeedback;Electrical Stimulation;Cryotherapy;Moist Heat;Functional mobility training;Therapeutic activities;Therapeutic exercise;Patient/family education;Neuromuscular re-education;Taping;Dry needling;Manual techniques    PT Next Visit Plan  f/u on bladder release, internal STM and using frozen coconut oil for moisturizers    PT Home Exercise Plan  Access Code: [email or web address] and Agree with Plan of Care  Patient       Patient will benefit from skilled therapeutic intervention in order to improve the following deficits and impairments:  Increased muscle spasms, Impaired tone, Decreased range of motion, Decreased strength, Increased fascial restricitons, Impaired flexibility, Postural dysfunction, Pain  Visit Diagnosis: Muscle weakness (generalized)  Unspecified lack of coordination     Problem List Patient Active Problem List   Diagnosis Date Noted  . Anal cancer ([place]) 06/13/2019  . Hematuria,  gross 03/11/2017  . Status post total shoulder arthroplasty, right 09/12/2016  . Arthritis of knee 02/27/2015  . Primary osteoarthritis of right  knee Valgus 02/24/2015  . Neuropathy 09/07/2013  . Breast cancer of upper-outer quadrant of left female breast ([place]) 08/09/2013  . Breast cancer of upper-outer quadrant of right female breast ([place]) 08/09/2013  . Osteoarthritis of right hip 05/03/2013  . Osteoarthritis of left hip 02/15/2013    Jule Ser, PT 10/19/2019, 12:03 PM  [facility] [address] [place], [place], [address] Phone: [phone]   Fax:  [phone]  Name: Rainna Estudillo MRN: [id number] Date of Birth: 1951-02-03

## 2019-10-19 NOTE — Telephone Encounter (Signed)
Addendum to earlier note: I recommended for her to stop Linzess. Continue to hydrate adequately. Use sitz baths and pain medication PRN which she has. She understands.   Cira Rue, NP 10/19/2019

## 2019-10-19 NOTE — Telephone Encounter (Signed)
I called [name] to f/u on her Mychart message reporting rectal pain, itching, and loss of control of bowels. Over the weekend she had watery BM "without knowing it" and continues to have watery stool without warning 1-2 per day every 1-2 days. She tried Linzess because she felt constipated and that caused the watery stools. Stools solidified after recovery from chemoRT but now watery again. She is trying to drink a lot. She thinks she has a hemorrhoid, feels like she's "sitting on something" which causes vaginal irritation, used topical cream for itching relief. Hesitant to use suppositories due to pain. Has a "prickly" feeling around the anus. I offered her lab, f/u, IVF today but she declined and thinks she is drinking enough. I am referring her for PET scan to evaluate response to treatment and r/o residual tumor. I will see her next day. She has an existing appt with me on 2/8. I will also f/u on referral to surgery for anoscopy in the meantime. She understands and plan and appreciates the call.   Cira Rue, NP  10/19/2019

## 2019-10-24 NOTE — Progress Notes (Signed)
[place]   [phone] [phone]   Clinic Follow up Note   Patient Care Team: Kathyrn Lass, MD as PCP - General (Family Medicine) Wonda Horner, MD as Consulting Physician (Gastroenterology) Truitt Merle, MD as Consulting Physician (Hematology) Alla Feeling, NP as Nurse Practitioner (Nurse Practitioner) 10/25/2019  CHIEF COMPLAINT: F/u anal cancer   SUMMARY OF ONCOLOGIC HISTORY: Oncology History Overview Note  Breast cancer of upper-outer quadrant of right female breast, (ER/PR positive, her2/Neu negative)   Primary site: Breast (Right)   Staging method: AJCC 7th Edition   Clinical: Stage IA (T1a, N0, cM0) signed by [name], MD on 09/07/2013 11:14 AM   Pathologic: Stage IA (T1a, N0, cM0) signed by [name], MD on 09/07/2013 11:14 AM   Summary: Stage IA (T1a, N0, cM0)  Breast cancer of upper-outer quadrant of left female breast (ER/PR/Her2Neu negative)   Primary site: Breast (Left)   Staging method: AJCC 7th Edition   Clinical: Stage IIA (T2, N0, cM0) signed by [name], MD on 09/07/2013 11:35 AM   Pathologic: Stage IIA (T2, N0, cM0) signed by [name], MD on 09/07/2013 11:35 AM   Summary: Stage IIA (T2, N0, cM0)     Breast cancer of upper-outer quadrant of left female breast ([place])  07/10/2007 Procedure   US biopsy showed invasive ductal carcinoma 0.8cm, Nottingham grade 3   07/30/2007 Surgery   She had left breast lumpectomy and LN biopsy which showed high grade invasive ductal cancer Nottingham grade 3, 2.7 cm, ER/PR/Her2 neu negative   11/24/2007 Surgery   Patient elected for bilateral mastectomy   09/07/2008 - 03/08/2009 Chemotherapy   dates are approximate: she received adriamycin and cytoxan followed by Taxol   03/08/2009 - 05/08/2009 Radiation Therapy   dates are approximate, she received XRT    Breast cancer of upper-outer quadrant of right female breast ([place])  07/10/2006 Procedure   stereotactic biopsy showed  atypical hyperplasia   10/23/2006 Surgery   right lumpectomy showed invasive ductal ca (Nottingham grade 1)  31m and DCIS, ER/PR positive Her 2 negative   10/09/2007 - 10/08/2012 Chemotherapy   Patient was placed on Tamoxifen   03/24/2017 Imaging   No acute findings. No evidence of recurrent carcinoma or metastatic disease   Anal cancer ([place]  06/04/2019 Procedure   Colonoscopy per Dr. SAnson Fret Findings-the digital rectal exam revealed a 3 cm diameter firm rectal mass.  The mass was noncircumferential and located predominantly at the right bowel wall at the anorectal junction. A nonobstructing mass was found at the anus and in the rectum    06/04/2019 Initial Biopsy   Follow pathology: Large intestine, rectum biopsy: Invasive well to moderately differentiated squamous cell carcinoma.  No rectal mucosa present.  There is strong diffuse expression of P 16 immunostain.  CDX 2, p63 and mCEA immunostains are also used in the diagnostic work-up of the case.   06/04/2019 Initial Diagnosis   Anal cancer ([place]   06/21/2019 PET scan   IMPRESSION: 1. Anorectal primary. No hypermetabolic metastatic disease within the chest, abdomen, or pelvis. Perirectal nodes, at least 1 of which is new since 02/26/2018 CT, suspicious based on size and interval development. 2. Mild limitations secondary to beam hardening artifact from bilateral hip arthroplasty. 3.  Aortic Atherosclerosis (ICD10-I70.0).   06/28/2019 Cancer Staging   Staging form: Anus, AJCC 8th Edition - Clinical stage from 06/28/2019: Stage IIA (cT2, cN0, cM0) - Signed by BAlla Feeling NP on 06/28/2019   06/28/2019 -  07/26/2019 Chemotherapy   concurrent chemoRT with Mitomycin and 5FU on week 1 and week 5 on starting 06/28/19. Last dose on 07/26/19   06/28/2019 - 08/09/2019 Radiation Therapy   concurrent chemoRT with Dr [name] 06/28/19-08/09/19     CURRENT THERAPY: S/p curative intent chemoRT with 5FU/mitomycin from 06/28/19 -  08/09/2019  INTERVAL HISTORY: Ms. Shor returns for f/u as scheduled. She feels better this week than last. From 1/25 - 1/30 were "horrific" with stool leakage, hemorrhoid issues, and rectal pain between anus and vagina. This is slightly better now. PT is helping vaginal stiffness but not bowel leakage. On 2/5 and 2/7 she had formed "pasty" stools. Has some blood with wiping. Taking linzess and PRN miralax. It is uncomfortable to sit, with anovaginal pain similar but not as bad as when she was initially diagnosed. This initially improved after chemoRT now worse again. Takes mostly tylenol, only dilaudid when pain is excruciating. Also takes excedrine for migraines. Has a headache today she attributes to stress and returning to [facility]. Back pain is similar, mild when not touching it. She is little unsteady on her feet at times, not dizzy. Denies fall.      MEDICAL HISTORY:  Past Medical History:  Diagnosis Date  . Anxiety   . Arthritis   . Bilateral breast cancer ([place]) 10/21/2013  . Breast cancer ([place])   . Cancer ([place])   . Depression   . Gallstones   . GERD (gastroesophageal reflux disease)    doesn't take any meds for this  . Headache    h/o migraines      . History of bladder infections   . History of hiatal hernia   . History of migraine   . Hyperlipidemia    takes Simvastatin daily  . Hypertension    takes Hyzaar  . Joint pain   . Joint swelling   . Lumbar stenosis   . Neuropathy 09/07/2013  . Pneumonia   . Pre-diabetes     SURGICAL HISTORY: Past Surgical History:  Procedure Laterality Date  . ABDOMINAL HYSTERECTOMY    . bone spur removed from left foot    . CHOLECYSTECTOMY    . COLONOSCOPY    . double mastectomy     . HERNIA REPAIR     umbilical  . right knee arthroscopy    . right shoulder arthroscopy    . surgery for hiatal hernia    . TONSILLECTOMY    . TOTAL HIP ARTHROPLASTY Left 02/12/2013  . TOTAL HIP ARTHROPLASTY Left 02/12/2013   Procedure: LEFT TOTAL HIP  ARTHROPLASTY;  Surgeon: Kerin Salen, MD;  Location: [place];  Service: Orthopedics;  Laterality: Left;  . TOTAL HIP ARTHROPLASTY Right 05/03/2013   Procedure: TOTAL HIP ARTHROPLASTY;  Surgeon: Kerin Salen, MD;  Location: [place];  Service: Orthopedics;  Laterality: Right;  . TOTAL KNEE ARTHROPLASTY Right 02/27/2015   Procedure: TOTAL KNEE ARTHROPLASTY;  Surgeon: Frederik Pear, MD;  Location: [place];  Service: Orthopedics;  Laterality: Right;  . TOTAL SHOULDER ARTHROPLASTY Right 09/12/2016   Procedure: RIGHT TOTAL SHOULDER ARTHROPLASTY;  Surgeon: Tania Ade, MD;  Location: [place];  Service: Orthopedics;  Laterality: Right;  RIGHT TOTAL SHOULDER ARTHROPLASTY    I have reviewed the social history and family history with the patient and they are unchanged from previous note.  ALLERGIES:  is allergic to oxycodone-acetaminophen; codeine; and vicodin [hydrocodone-acetaminophen].  MEDICATIONS:  Current Outpatient Medications  Medication Sig Dispense Refill  . ciprofloxacin (CIPRO) 500 MG tablet Take 1  tablet (500 mg total) by mouth daily. 3 tablet 0  . HYDROmorphone (DILAUDID) 2 MG tablet Take 0.5-1 tablets (1-2 mg total) by mouth every 4 (four) hours as needed for severe pain. 60 tablet 0  . LINZESS 72 MCG capsule     . losartan (COZAAR) 100 MG tablet TK 1 T PO D  6  . ondansetron (ZOFRAN ODT) 4 MG disintegrating tablet Take 1 tablet (4 mg total) by mouth every 8 (eight) hours as needed for nausea or vomiting. 6 tablet 0  . phenazopyridine (PYRIDIUM) 200 MG tablet Take 1 tablet (200 mg total) by mouth 3 (three) times daily as needed for pain. 30 tablet 0  . potassium chloride 20 MEQ/15ML (10%) SOLN Take 15 mLs (20 mEq total) by mouth 2 (two) times daily. 473 mL 0  . PROAIR HFA 108 (90 Base) MCG/ACT inhaler 2 inhalations every 4-6 hours as needed. (Patient taking differently: Inhale 2 puffs into the lungs every 6 (six) hours as needed for wheezing or shortness of breath. ) 18 g 1  . rosuvastatin  (CRESTOR) 40 MG tablet Take 40 mg by mouth daily.    . traMADol (ULTRAM) 50 MG tablet Take 1-2 tablets (50-100 mg total) by mouth every 6 (six) hours as needed for severe pain. 45 tablet 0  . benzonatate (TESSALON) 100 MG capsule Take 1-2 capsules (100-200 mg total) by mouth 3 (three) times daily as needed for cough. 40 capsule 0  . fluconazole (DIFLUCAN) 100 MG tablet Take one tablet po on 07/12/2019, then second dose on 07/14/2019 (Patient not taking: Reported on 10/25/2019) 2 tablet 0  . hyoscyamine (LEVSIN) 0.125 MG tablet Take 1 tablet (0.125 mg total) by mouth every 4 (four) hours as needed. (Patient not taking: Reported on 10/25/2019) 30 tablet 1   No current facility-administered medications for this visit.    PHYSICAL EXAMINATION: ECOG PERFORMANCE STATUS: 1 - Symptomatic but completely ambulatory  Vitals:   10/25/19 1157  BP: (!) 147/97  Pulse: 82  Resp: 18  Temp: 98.5 F (36.9 C)  SpO2: 96%   Filed Weights   10/25/19 1157  Weight: 223 lb 6.4 oz (101.3 kg)    GENERAL:alert, no distress and comfortable SKIN: no rash  EYES:  sclera clear OROPHARYNX:  NECK: without mass LYMPH:  no palpable inguinal lymphadenopathy.  LUNGS: clear with normal breathing effort HEART: regular rate & rhythm, no lower extremity edema ABDOMEN:abdomen soft, non-tender and normal bowel sounds. Mild pelvic pain to deep palpation. Mild perineal hyperpigmentation, no ulcers or open wound. No palpable anal mass. No blood on glove. Rectal exam limited by pain and body habitus  NEURO: alert & oriented x 3 with fluent speech  LABORATORY DATA:  I have reviewed the data as listed CBC Latest Ref Rng & Units 10/25/2019 09/27/2019 08/20/2019  WBC 4.0 - 10.5 K/uL 3.2(L) 3.2(L) 2.7(L)  Hemoglobin 12.0 - 15.0 g/dL 10.9(L) 10.6(L) 11.2(L)  Hematocrit 36.0 - 46.0 % 35.2(L) 33.8(L) 35.6(L)  Platelets 150 - 400 K/uL 149(L) 129(L) 111(L)     CMP Latest Ref Rng & Units 10/25/2019 09/27/2019 08/20/2019  Glucose 70 - 99  mg/dL 90 85 111(H)  BUN 8 - 23 mg/dL '13 14 12  ' Creatinine 0.44 - 1.00 mg/dL 0.91 0.98 0.94  Sodium 135 - 145 mmol/L 143 142 144  Potassium 3.5 - 5.1 mmol/L 3.9 3.8 3.5  Chloride 98 - 111 mmol/L 110 112(H) 108  CO2 22 - 32 mmol/L '26 23 27  ' Calcium 8.9 - 10.3 mg/dL 9.1  8.7(L) 8.4(L)  Total Protein 6.5 - 8.1 g/dL 6.9 6.5 6.5  Total Bilirubin 0.3 - 1.2 mg/dL 0.4 0.3 0.5  Alkaline Phos 38 - 126 U/L 72 71 81  AST 15 - 41 U/L 18 25 37  ALT 0 - 44 U/L '15 18 30      ' RADIOGRAPHIC STUDIES: I have personally reviewed the radiological images as listed and agreed with the findings in the report. No results found.   ASSESSMENT & PLAN: 69 yo female    1. Anal Squamous Cell Carcinoma, cT2N0 clinical stage IIA -She was diagnosed in 05/2019.She presented with rectal bleeding and anal pressure.Biopsy showeda 3 cm mass at the anorectal junction, path confirmedwell to moderately differentiated squamous cell carcinoma.10/5/20PET scan shows no metastasis. -S/p curative intent chemoRT with mitomycin and 5FU completed on 08/09/19 -her rectal pain improved after treatment, and rectal bleeding and constipation resolved after chemoRT. Since late December she has recurrent constipation, intermittent rectal bleeding, and stool leakage. Her rectal pain is worsened, similar but not as bad as her initial diagnosis.  -I do not palpate an anal mass on my limited exam. I previously referred her to colorectal surgery for anoscopy but she has not been seen yet.  -Labs reviewed, CBC gradually improving, K normal 3.9. she can stop oral K supplement. CMP is normal.  -Ms. Gerilyn Nestle appears stable. I feel her bowel issues and stool leakage are related to chemoRT, more likely radiation, she is in PT. Recurrent rectal pain may also be subsequent to chemoRT, but a residual mass can not be fully excluded on my limited rectal exam.  -she has been scheduled for PET scan to r/o residual disease. Will do phone call after her  scan -I will f/u on the referral to GI surgery for monitoring  -F/u pending PET scan results   2. Rectal pain, rectal bleeding, constipation  -she presented with rectal pain, bleeding and constipation. Bleeding and constipation resolved after she completed chemoRT, and rectal pain improved -constipation, rectal pain worsened over the last 4-6 weeks. She takes mostly tylenol but rarely requires dilaudid. I encouraged her to try to wean off.  -continues alternating miralax and linzess for constipation, stools more formed lately   3. Low/sacral back pain -she reports this is chronic issue that comes and goes -she had significant tenderness to palpation in low back near sacrum on 1/11. She notes this is not new for her.  -stable  4. H/o bilateral breast cancer -2007: right breast biopsy showed atypical hyperplasia, s/p right lumpectomy in 06/2006 ER/PR+/HER2-, grade 1. Completed tamoxifen from 09/2007 - 09/2012 -2008: left breast invasive ductal carcinoma, triple negative, grade 3 s/p lumpectomy in 07/2007 and eventual bilateral mastectomy; s/p adjuvant chemo AC-T and radiation   5. Genetics -patient is adopted, no known family history  -she reportedly had genetic testing which was negative. Her daughter also negative. I do not have the report.  6. HTN -on losartan -elevated in clinic  7.Pancytopenia  -secondary to chemo, and iron deficiency. -iron studies are adequate lately -mild leukopenia and anemia are stable, plt improving -continue monitoring   8. UTI, dysuria, Low urine output -UA on 11/18 shows positive nitrites, trace leukocytes, 11-20 WBC and bacteria. S/p cipro x3 days. Repeat UA 08/09/19 was negative -dysuria is likely related to radiation irritation, improved with pyridium and pelvic PT -she notes vaginal irritation when she sits, skin exam is unremarkable today, no open wounds  PLAN: -Labs reviewed -Stop oral K -PET 2/15, phone call after scan  -  F/u pending  PET scan result  -F/u on referral to GI surgery  -Continue pelvic PT    No problem-specific Assessment & Plan notes found for this encounter.   No orders of the defined types were placed in this encounter.  All questions were answered. The patient knows to call the clinic with any problems, questions or concerns. No barriers to learning was detected. I spent 20 minutes counseling the patient face to face. The total time spent in the appointment was 25 minutes and more than 50% was on counseling, review of test results, and coordination of care.     Alla Feeling, NP 10/25/19

## 2019-10-25 ENCOUNTER — Inpatient Hospital Stay: Payer: PPO

## 2019-10-25 ENCOUNTER — Other Ambulatory Visit: Payer: Self-pay

## 2019-10-25 ENCOUNTER — Encounter: Payer: Self-pay | Admitting: Nurse Practitioner

## 2019-10-25 ENCOUNTER — Inpatient Hospital Stay: Payer: PPO | Attending: Hematology | Admitting: Nurse Practitioner

## 2019-10-25 ENCOUNTER — Telehealth: Payer: Self-pay

## 2019-10-25 VITALS — BP 147/97 | HR 82 | Temp 98.5°F | Resp 18 | Ht 65.0 in | Wt 223.4 lb

## 2019-10-25 DIAGNOSIS — R102 Pelvic and perineal pain: Secondary | ICD-10-CM | POA: Diagnosis not present

## 2019-10-25 DIAGNOSIS — Z853 Personal history of malignant neoplasm of breast: Secondary | ICD-10-CM | POA: Insufficient documentation

## 2019-10-25 DIAGNOSIS — M545 Low back pain: Secondary | ICD-10-CM | POA: Diagnosis not present

## 2019-10-25 DIAGNOSIS — D61810 Antineoplastic chemotherapy induced pancytopenia: Secondary | ICD-10-CM | POA: Insufficient documentation

## 2019-10-25 DIAGNOSIS — N942 Vaginismus: Secondary | ICD-10-CM | POA: Insufficient documentation

## 2019-10-25 DIAGNOSIS — C210 Malignant neoplasm of anus, unspecified: Secondary | ICD-10-CM

## 2019-10-25 DIAGNOSIS — I10 Essential (primary) hypertension: Secondary | ICD-10-CM | POA: Insufficient documentation

## 2019-10-25 DIAGNOSIS — D649 Anemia, unspecified: Secondary | ICD-10-CM | POA: Insufficient documentation

## 2019-10-25 DIAGNOSIS — D72819 Decreased white blood cell count, unspecified: Secondary | ICD-10-CM | POA: Diagnosis not present

## 2019-10-25 LAB — CBC WITH DIFFERENTIAL (CANCER CENTER ONLY)
Abs Immature Granulocytes: 0 10*3/uL (ref 0.00–0.07)
Basophils Absolute: 0 10*3/uL (ref 0.0–0.1)
Basophils Relative: 0 %
Eosinophils Absolute: 0.1 10*3/uL (ref 0.0–0.5)
Eosinophils Relative: 3 %
HCT: 35.2 % — ABNORMAL LOW (ref 36.0–46.0)
Hemoglobin: 10.9 g/dL — ABNORMAL LOW (ref 12.0–15.0)
Immature Granulocytes: 0 %
Lymphocytes Relative: 25 %
Lymphs Abs: 0.8 10*3/uL (ref 0.7–4.0)
MCH: 30 pg (ref 26.0–34.0)
MCHC: 31 g/dL (ref 30.0–36.0)
MCV: 97 fL (ref 80.0–100.0)
Monocytes Absolute: 0.5 10*3/uL (ref 0.1–1.0)
Monocytes Relative: 16 %
Neutro Abs: 1.8 10*3/uL (ref 1.7–7.7)
Neutrophils Relative %: 56 %
Platelet Count: 149 10*3/uL — ABNORMAL LOW (ref 150–400)
RBC: 3.63 MIL/uL — ABNORMAL LOW (ref 3.87–5.11)
RDW: 14.7 % (ref 11.5–15.5)
WBC Count: 3.2 10*3/uL — ABNORMAL LOW (ref 4.0–10.5)
nRBC: 0 % (ref 0.0–0.2)

## 2019-10-25 LAB — CMP (CANCER CENTER ONLY)
ALT: 15 U/L (ref 0–44)
AST: 18 U/L (ref 15–41)
Albumin: 3.5 g/dL (ref 3.5–5.0)
Alkaline Phosphatase: 72 U/L (ref 38–126)
Anion gap: 7 (ref 5–15)
BUN: 13 mg/dL (ref 8–23)
CO2: 26 mmol/L (ref 22–32)
Calcium: 9.1 mg/dL (ref 8.9–10.3)
Chloride: 110 mmol/L (ref 98–111)
Creatinine: 0.91 mg/dL (ref 0.44–1.00)
GFR, Est AFR Am: >60 mL/min (ref >60)
GFR, Estimated: >60 mL/min (ref >60)
Glucose, Bld: 90 mg/dL (ref 70–99)
Potassium: 3.9 mmol/L (ref 3.5–5.1)
Sodium: 143 mmol/L (ref 135–145)
Total Bilirubin: 0.4 mg/dL (ref 0.3–1.2)
Total Protein: 6.9 g/dL (ref 6.5–8.1)

## 2019-10-25 LAB — IRON AND TIBC
Iron: 47 ug/dL (ref 41–142)
Saturation Ratios: 20 % — ABNORMAL LOW (ref 21–57)
TIBC: 230 ug/dL — ABNORMAL LOW (ref 236–444)
UIBC: 183 ug/dL (ref 120–384)

## 2019-10-25 LAB — FERRITIN: Ferritin: 76 ng/mL (ref 11–307)

## 2019-10-25 NOTE — Telephone Encounter (Signed)
Per Cira Rue NP called [facility] [phone] to check on patients referral. I left a message for the referral navigator there to call me back. The first person that I spoke to said that they did not see a referral for her but the Navigator will be able to check again behind her. I gave the desk number [phone] as the call back number. Made Lacie aware of situation.

## 2019-10-26 ENCOUNTER — Ambulatory Visit: Payer: PPO | Admitting: Physical Therapy

## 2019-10-26 ENCOUNTER — Other Ambulatory Visit: Payer: Self-pay

## 2019-10-26 ENCOUNTER — Telehealth: Payer: Self-pay | Admitting: Nurse Practitioner

## 2019-10-26 DIAGNOSIS — M6281 Muscle weakness (generalized): Secondary | ICD-10-CM

## 2019-10-26 DIAGNOSIS — E7800 Pure hypercholesterolemia, unspecified: Secondary | ICD-10-CM | POA: Diagnosis not present

## 2019-10-26 DIAGNOSIS — R0981 Nasal congestion: Secondary | ICD-10-CM | POA: Diagnosis not present

## 2019-10-26 DIAGNOSIS — R279 Unspecified lack of coordination: Secondary | ICD-10-CM

## 2019-10-26 DIAGNOSIS — Z6841 Body Mass Index (BMI) 40.0 and over, adult: Secondary | ICD-10-CM | POA: Diagnosis not present

## 2019-10-26 NOTE — Telephone Encounter (Signed)
Scheduled appt per 2/8 los.  Spoke with pt and she is aware of her appt date and time

## 2019-10-26 NOTE — Patient Instructions (Signed)
Access Code: [email or web address]  URL: [email or web address]  Date: 10/26/2019  Prepared by: Jari Favre   Exercises Seated Hamstring Stretch - 10 reps - 3 sets - 1x daily - 7x weekly Seated Piriformis Stretch - 3 reps - 1 sets - 30 sec hold - 1x daily - 7x weekly Supine Figure 4 Piriformis Stretch - 3 reps - 1 sets - 30 sec hold - 1x daily - 7x weekly Supine Diaphragmatic Breathing with Pelvic Floor Lengthening - 10 reps - 1 sets - 3x daily - 7x weekly Supine Butterfly Groin Stretch - 3 reps - 1 sets - 30 sec hold - 1x daily - 7x weekly Supine Pelvic Floor Contraction - 10 reps - 1 sets - 2 sec hold - 3x daily - 7x weekly Hooklying Small March - 10 reps - 2 sets - 1x daily - 7x weekly Seated Lumbar Flexion Stretch - 10 reps - 1 sets - 10 sec hold - 1x daily - 7x weekly

## 2019-10-26 NOTE — Therapy (Signed)
[facility] [address] [place], [place], [address] Phone: [phone]   Fax:  [phone]  Physical Therapy Treatment  Patient Details  Name: Cynthia Velasquez MRN: [id number] Date of Birth: 03/19/1951 Referring Provider (PT): Truitt Merle, MD   Encounter Date: 10/26/2019  PT End of Session - 10/26/19 1015    Visit Number  5    Date for PT Re-Evaluation  12/28/19    PT Start Time  0932    PT Stop Time  [id number]    PT Time Calculation (min)  42 min    Activity Tolerance  Patient tolerated treatment well    Behavior During Therapy  [facility] for tasks assessed/performed       Past Medical History:  Diagnosis Date  . Anxiety   . Arthritis   . Bilateral breast cancer ([place]) 10/21/2013  . Breast cancer ([place])   . Cancer ([place])   . Depression   . Gallstones   . GERD (gastroesophageal reflux disease)    doesn't take any meds for this  . Headache    h/o migraines      . History of bladder infections   . History of hiatal hernia   . History of migraine   . Hyperlipidemia    takes Simvastatin daily  . Hypertension    takes Hyzaar  . Joint pain   . Joint swelling   . Lumbar stenosis   . Neuropathy 09/07/2013  . Pneumonia   . Pre-diabetes     Past Surgical History:  Procedure Laterality Date  . ABDOMINAL HYSTERECTOMY    . bone spur removed from left foot    . CHOLECYSTECTOMY    . COLONOSCOPY    . double mastectomy     . HERNIA REPAIR     umbilical  . right knee arthroscopy    . right shoulder arthroscopy    . surgery for hiatal hernia    . TONSILLECTOMY    . TOTAL HIP ARTHROPLASTY Left 02/12/2013  . TOTAL HIP ARTHROPLASTY Left 02/12/2013   Procedure: LEFT TOTAL HIP ARTHROPLASTY;  Surgeon: Kerin Salen, MD;  Location: [place];  Service: Orthopedics;  Laterality: Left;  . TOTAL HIP ARTHROPLASTY Right 05/03/2013   Procedure: TOTAL HIP ARTHROPLASTY;  Surgeon: Kerin Salen, MD;  Location: [place];  Service: Orthopedics;   Laterality: Right;  . TOTAL KNEE ARTHROPLASTY Right 02/27/2015   Procedure: TOTAL KNEE ARTHROPLASTY;  Surgeon: Frederik Pear, MD;  Location: [place];  Service: Orthopedics;  Laterality: Right;  . TOTAL SHOULDER ARTHROPLASTY Right 09/12/2016   Procedure: RIGHT TOTAL SHOULDER ARTHROPLASTY;  Surgeon: Tania Ade, MD;  Location: [place];  Service: Orthopedics;  Laterality: Right;  RIGHT TOTAL SHOULDER ARTHROPLASTY    There were no vitals filed for this visit.  Subjective Assessment - 10/26/19 1008    Subjective  I only had leakage 2x last week and they weren't bad.  I think we are going in the right direction                       [facility] Adult PT Treatment/Exercise - 10/26/19 0001      Lumbar Exercises: Stretches   Other Lumbar Stretch Exercise  lumbar flexion - pball educated and performed to add to HEP      Manual Therapy   Manual Therapy  Manual Traction    Myofascial Release  thoracolumbar pulls - gluteals bilateral fascial release then moving into musle tissue - TTP around GT  muslce attachments     Manual Traction  sacral traction             PT Education - 10/26/19 1015    Education Details  Access Code: [email or web address]    Person(s) Educated  Patient    Methods  Explanation;Demonstration;Handout;Verbal cues    Comprehension  Verbalized understanding;Returned demonstration       PT Short Term Goals - 10/15/19 1021      PT SHORT TERM GOAL #1   Title  ind with initial HEP    Status  Achieved      PT SHORT TERM GOAL #2   Title  pt will report 30% less leakage    Status  On-going        PT Long Term Goals - 10/15/19 1021      PT LONG TERM GOAL #1   Title  Pt will be able to reduce leakage and use only 1-2 pads/day    Baseline  don't have the sensation, same    Status  On-going      PT LONG TERM GOAL #2   Title  Pt will report no nocturia    Baseline  leakage at night    Status  On-going      PT LONG TERM GOAL #3   Title  Pt will report she can feel  the urge to urinate at least 90% of the time due to improved health of pelvic floor muscles    Baseline  leaks all throughout the day without any awareness of need to void    Status  On-going      PT LONG TERM GOAL #4   Title  Pt will report at least 60% less leakage due to improved pelvic strength    Baseline  still occurs    Status  On-going            Plan - 10/26/19 1133    Clinical Impression Statement  Pt had a good week last week and overall feels less pain.  Pt had good response from soft tissue mobs today with less low band and hip pain after the treatment.  Pt had increased stride length after today's treatment.  Pt was given lumbar stretch to maintain improved soft tissue length at home.  Pt will benefit from skilled PT to continue working on pain management and improved muscle function.    PT Treatment/Interventions  ADLs/Self Care Home Management;Biofeedback;Electrical Stimulation;Cryotherapy;Moist Heat;Functional mobility training;Therapeutic activities;Therapeutic exercise;Patient/family education;Neuromuscular re-education;Taping;Dry needling;Manual techniques    PT Next Visit Plan  lumbar flexion stretch, TrA and pelvic floor exercises, internal STM as needed, biofeedback    PT Home Exercise Plan  Access Code: [email or web address]    Consulted and Agree with Plan of Care  Patient       Patient will benefit from skilled therapeutic intervention in order to improve the following deficits and impairments:  Increased muscle spasms, Impaired tone, Decreased range of motion, Decreased strength, Increased fascial restricitons, Impaired flexibility, Postural dysfunction, Pain  Visit Diagnosis: Muscle weakness (generalized)  Unspecified lack of coordination     Problem List Patient Active Problem List   Diagnosis Date Noted  . Anal cancer ([place]) 06/13/2019  . Hematuria, gross 03/11/2017  . Status post total shoulder arthroplasty, right 09/12/2016  . Arthritis of knee 02/27/2015   . Primary osteoarthritis of right knee Valgus 02/24/2015  . Neuropathy 09/07/2013  . Breast cancer of upper-outer quadrant of left female breast ([place]) 08/09/2013  . Breast cancer of upper-outer quadrant of  right female breast ([place]) 08/09/2013  . Osteoarthritis of right hip 05/03/2013  . Osteoarthritis of left hip 02/15/2013    Camillo Flaming Jolaine Fryberger,PT 10/26/2019, 12:38 PM  [facility] [address], [place], [place], [address] Phone: [phone]   Fax:  [phone]  Name: Cynthia Velasquez MRN: [id number] Date of Birth: 1950/09/22

## 2019-10-27 ENCOUNTER — Telehealth: Payer: Self-pay

## 2019-10-27 NOTE — Telephone Encounter (Signed)
Received voicemail from [place] [facility] ( it was hard to understand name of place from voicemail) they requested office visit notes from patients visit on 10/25/19. Faxed over office notes as requested to  fax# [phone] Phone# to reach Roselyn Reef at is [phone] Fax came back as complete and placed in bin beside desk.

## 2019-11-01 ENCOUNTER — Other Ambulatory Visit: Payer: Self-pay

## 2019-11-01 ENCOUNTER — Encounter (HOSPITAL_COMMUNITY)
Admission: RE | Admit: 2019-11-01 | Discharge: 2019-11-01 | Disposition: A | Discharge status: Home / Self Care | Payer: PPO | Source: Ambulatory Visit | Attending: Nurse Practitioner | Admitting: Nurse Practitioner

## 2019-11-01 DIAGNOSIS — I700 Atherosclerosis of aorta: Secondary | ICD-10-CM | POA: Insufficient documentation

## 2019-11-01 DIAGNOSIS — Z79899 Other long term (current) drug therapy: Secondary | ICD-10-CM | POA: Diagnosis not present

## 2019-11-01 DIAGNOSIS — C210 Malignant neoplasm of anus, unspecified: Secondary | ICD-10-CM

## 2019-11-01 LAB — GLUCOSE, CAPILLARY: Glucose-Capillary: 96 mg/dL (ref 70–99)

## 2019-11-01 MED ORDER — FLUDEOXYGLUCOSE F - 18 (FDG) INJECTION
11.5000 | Freq: Once | INTRAVENOUS | Status: AC | PRN
  Administered 2019-11-01: 08:00:00 11.5 via INTRAVENOUS

## 2019-11-01 NOTE — Progress Notes (Signed)
[place]   [phone] Fax:([phone]   Clinic Follow up Note   Patient Care Team: Kathyrn Lass, MD as PCP - General (Family Medicine) Wonda Horner, MD as Consulting Physician (Gastroenterology) Truitt Merle, MD as Consulting Physician (Hematology) Alla Feeling, NP as Nurse Practitioner (Nurse Practitioner) 11/02/2019   I connected with Cynthia Velasquez on 11/02/19 at 3:30 PM EST by telephone visit and verified that I am speaking with the correct person using two identifiers.   I discussed the limitations, risks, security and privacy concerns of performing an evaluation and management service by telemedicine and the availability of in-person appointments. I also discussed with the patient that there may be a patient responsible charge related to this service. The patient expressed understanding and agreed to proceed.   Other persons participating in the visit and their role in the encounter: None  Patients location: Home Providers location: [facility] Office   Chief Complaint: F/u anal cancer   SUMMARY OF ONCOLOGIC HISTORY: Oncology History Overview Note  Breast cancer of upper-outer quadrant of right female breast, (ER/PR positive, her2/Neu negative)   Primary site: Breast (Right)   Staging method: AJCC 7th Edition   Clinical: Stage IA (T1a, N0, cM0) signed by [name], MD on 09/07/2013 11:14 AM   Pathologic: Stage IA (T1a, N0, cM0) signed by [name], MD on 09/07/2013 11:14 AM   Summary: Stage IA (T1a, N0, cM0)  Breast cancer of upper-outer quadrant of left female breast (ER/PR/Her2Neu negative)   Primary site: Breast (Left)   Staging method: AJCC 7th Edition   Clinical: Stage IIA (T2, N0, cM0) signed by [name], MD on 09/07/2013 11:35 AM   Pathologic: Stage IIA (T2, N0, cM0) signed by [name], MD on 09/07/2013 11:35 AM   Summary: Stage IIA (T2, N0, cM0)     Breast cancer of upper-outer quadrant of left female breast ([place])  07/10/2007  Procedure   US biopsy showed invasive ductal carcinoma 0.8cm, Nottingham grade 3   07/30/2007 Surgery   She had left breast lumpectomy and LN biopsy which showed high grade invasive ductal cancer Nottingham grade 3, 2.7 cm, ER/PR/Her2 neu negative   11/24/2007 Surgery   Patient elected for bilateral mastectomy   09/07/2008 - 03/08/2009 Chemotherapy   dates are approximate: she received adriamycin and cytoxan followed by Taxol   03/08/2009 - 05/08/2009 Radiation Therapy   dates are approximate, she received XRT    Breast cancer of upper-outer quadrant of right female breast ([place])  07/10/2006 Procedure   stereotactic biopsy showed atypical hyperplasia   10/23/2006 Surgery   right lumpectomy showed invasive ductal ca (Nottingham grade 1)  57m and DCIS, ER/PR positive Her 2 negative   10/09/2007 - 10/08/2012 Chemotherapy   Patient was placed on Tamoxifen   03/24/2017 Imaging   No acute findings. No evidence of recurrent carcinoma or metastatic disease   Anal cancer ([place]  06/04/2019 Procedure   Colonoscopy per Dr. SAnson Fret Findings-the digital rectal exam revealed a 3 cm diameter firm rectal mass.  The mass was noncircumferential and located predominantly at the right bowel wall at the anorectal junction. A nonobstructing mass was found at the anus and in the rectum    06/04/2019 Initial Biopsy   Follow pathology: Large intestine, rectum biopsy: Invasive well to moderately differentiated squamous cell carcinoma.  No rectal mucosa present.  There is strong diffuse expression of P 16 immunostain.  CDX 2, p63 and mCEA immunostains are also used in the diagnostic work-up  of the case.   06/04/2019 Initial Diagnosis   Anal cancer ([place])   06/21/2019 PET scan   IMPRESSION: 1. Anorectal primary. No hypermetabolic metastatic disease within the chest, abdomen, or pelvis. Perirectal nodes, at least 1 of which is new since 02/26/2018 CT, suspicious based on size and interval development. 2. Mild  limitations secondary to beam hardening artifact from bilateral hip arthroplasty. 3.  Aortic Atherosclerosis (ICD10-I70.0).   06/28/2019 Cancer Staging   Staging form: Anus, AJCC 8th Edition - Clinical stage from 06/28/2019: Stage IIA (cT2, cN0, cM0) - Signed by Alla Feeling, NP on 06/28/2019   06/28/2019 - 07/26/2019 Chemotherapy   concurrent chemoRT with Mitomycin and 5FU on week 1 and week 5 on starting 06/28/19. Last dose on 07/26/19   06/28/2019 - 08/09/2019 Radiation Therapy   concurrent chemoRT with Dr [name] 06/28/19-08/09/19   11/01/2019 PET scan   IMPRESSION: 1. Marked interval decrease in hypermetabolism noted at the level of the anal rectal primary. No evidence for hypermetabolic metastatic disease in the chest, abdomen, or pelvis. The perirectal lymph nodes identified previously have resolved in the interval. 2.  Aortic Atherosclerois (ICD10-170.0)     CURRENT THERAPY: Surveillance   INTERVAL HISTORY: Cynthia Velasquez presents by phone for f/u as scheduled. She had a PET scan on 11/01/19. Her bowel issues continue to fluctuate. She had formed BM this morning. Recently she started having yellow urine leakage with foul odor, burning, and lower abdominal and back discomfort. No fever or chills. No UTI lately but wonders if she has one now. She notes she "always has blood in my urine when they test it" not none visibly. Rectal pain/pressure has eased. She continues PT which is helping her back pain and vaginal issues. She has less pain with sitting.     MEDICAL HISTORY:  Past Medical History:  Diagnosis Date   Anxiety    Arthritis    Bilateral breast cancer ([place]) 10/21/2013   Breast cancer ([place])    Cancer ([place])    Depression    Gallstones    GERD (gastroesophageal reflux disease)    doesn't take any meds for this   Headache    h/o migraines       History of bladder infections    History of hiatal hernia    History of migraine    Hyperlipidemia    takes  Simvastatin daily   Hypertension    takes Hyzaar   Joint pain    Joint swelling    Lumbar stenosis    Neuropathy 09/07/2013   Pneumonia    Pre-diabetes     SURGICAL HISTORY: Past Surgical History:  Procedure Laterality Date   ABDOMINAL HYSTERECTOMY     bone spur removed from left foot     CHOLECYSTECTOMY     COLONOSCOPY     double mastectomy      HERNIA REPAIR     umbilical   right knee arthroscopy     right shoulder arthroscopy     surgery for hiatal hernia     TONSILLECTOMY     TOTAL HIP ARTHROPLASTY Left 02/12/2013   TOTAL HIP ARTHROPLASTY Left 02/12/2013   Procedure: LEFT TOTAL HIP ARTHROPLASTY;  Surgeon: Kerin Salen, MD;  Location: [place];  Service: Orthopedics;  Laterality: Left;   TOTAL HIP ARTHROPLASTY Right 05/03/2013   Procedure: TOTAL HIP ARTHROPLASTY;  Surgeon: Kerin Salen, MD;  Location: [place];  Service: Orthopedics;  Laterality: Right;   TOTAL KNEE ARTHROPLASTY Right 02/27/2015   Procedure:  TOTAL KNEE ARTHROPLASTY;  Surgeon: Frederik Pear, MD;  Location: [place];  Service: Orthopedics;  Laterality: Right;   TOTAL SHOULDER ARTHROPLASTY Right 09/12/2016   Procedure: RIGHT TOTAL SHOULDER ARTHROPLASTY;  Surgeon: Tania Ade, MD;  Location: [place];  Service: Orthopedics;  Laterality: Right;  RIGHT TOTAL SHOULDER ARTHROPLASTY    I have reviewed the social history and family history with the patient and they are unchanged from previous note.  ALLERGIES:  is allergic to oxycodone-acetaminophen; codeine; and vicodin [hydrocodone-acetaminophen].  MEDICATIONS:  Current Outpatient Medications  Medication Sig Dispense Refill   benzonatate (TESSALON) 100 MG capsule Take 1-2 capsules (100-200 mg total) by mouth 3 (three) times daily as needed for cough. 40 capsule 0   ciprofloxacin (CIPRO) 500 MG tablet Take 1 tablet (500 mg total) by mouth daily. 3 tablet 0   fluconazole (DIFLUCAN) 100 MG tablet Take one tablet po on 07/12/2019, then second dose on  07/14/2019 (Patient not taking: Reported on 10/25/2019) 2 tablet 0   HYDROmorphone (DILAUDID) 2 MG tablet Take 0.5-1 tablets (1-2 mg total) by mouth every 4 (four) hours as needed for severe pain. 60 tablet 0   hyoscyamine (LEVSIN) 0.125 MG tablet Take 1 tablet (0.125 mg total) by mouth every 4 (four) hours as needed. (Patient not taking: Reported on 10/25/2019) 30 tablet 1   LINZESS 72 MCG capsule      losartan (COZAAR) 100 MG tablet TK 1 T PO D  6   ondansetron (ZOFRAN ODT) 4 MG disintegrating tablet Take 1 tablet (4 mg total) by mouth every 8 (eight) hours as needed for nausea or vomiting. 6 tablet 0   phenazopyridine (PYRIDIUM) 200 MG tablet Take 1 tablet (200 mg total) by mouth 3 (three) times daily as needed for pain. 30 tablet 0   potassium chloride 20 MEQ/15ML (10%) SOLN Take 15 mLs (20 mEq total) by mouth 2 (two) times daily. 473 mL 0   PROAIR HFA 108 (90 Base) MCG/ACT inhaler 2 inhalations every 4-6 hours as needed. (Patient taking differently: Inhale 2 puffs into the lungs every 6 (six) hours as needed for wheezing or shortness of breath. ) 18 g 1   rosuvastatin (CRESTOR) 40 MG tablet Take 40 mg by mouth daily.     traMADol (ULTRAM) 50 MG tablet Take 1-2 tablets (50-100 mg total) by mouth every 6 (six) hours as needed for severe pain. 45 tablet 0   No current facility-administered medications for this visit.    PHYSICAL EXAMINATION:  There were no vitals filed for this visit. There were no vitals filed for this visit.  Patient appears well over the phone. Speech is clear and intact, non-pressured. There is no cough or conversational dyspnea. Mood and affect appear normal.   LABORATORY DATA:  No labs for this visit   RADIOGRAPHIC STUDIES: I have personally reviewed the radiological images as listed and agreed with the findings in the report. NM PET Image Restag (PS) Skull Base To Thigh  Result Date: 11/01/2019 CLINICAL DATA:  Subsequent treatment strategy for anal cancer.  EXAM: NUCLEAR MEDICINE PET SKULL BASE TO THIGH TECHNIQUE: 11.5 mCi F-18 FDG was injected intravenously. Full-ring PET imaging was performed from the skull base to thigh after the radiotracer. CT data was obtained and used for attenuation correction and anatomic localization. Fasting blood glucose: 96 mg/dl COMPARISON:  06/21/2019 FINDINGS: Mediastinal blood pool activity: SUV max 3.4 Liver activity: SUV max NA NECK: No hypermetabolic lymph nodes in the neck. Incidental CT findings: none CHEST: No hypermetabolic mediastinal  or hilar nodes. The 4 mm node or nodule described on the previous study of the low left axilla (seen image 67/4 today) is stable in size and SUV (1.5). No new or progressive hypermetabolic disease in the thorax. No suspicious pulmonary nodules on the CT scan. Incidental CT findings: Heart is enlarged. Atherosclerotic calcification is noted in the wall of the thoracic aorta. Bilateral mastectomy. ABDOMEN/PELVIS: Marked interval decrease in anal rectal hypermetabolism with SUV max = 3.5 today compared to 43.7 previously. No evidence for hypermetabolic lymphadenopathy in the abdomen/pelvis. No other suspicious abdominopelvic hypermetabolism. Incidental CT findings: Gallbladder surgically absent. Assessment of the lower pelvis obscured by beam hardening artifact from bilateral hip replacement. The low right perirectal lymph node seen on the previous study are no longer evident. Similar high right perirectal node identified previously has resolved. Interval increase in presacral soft tissue attenuation presumably treatment related. SKELETON: Mottled marrow uptake without definite bony hypermetabolic metastatic disease. Incidental CT findings: Thoracolumbar scoliosis. Status post bilateral hip replacement. Status post right shoulder replacement. IMPRESSION: 1. Marked interval decrease in hypermetabolism noted at the level of the anal rectal primary. No evidence for hypermetabolic metastatic disease in  the chest, abdomen, or pelvis. The perirectal lymph nodes identified previously have resolved in the interval. 2.  Aortic Atherosclerois (ICD10-170.0) Electronically Signed   By: Misty Stanley M.D.   On: 11/01/2019 09:57     ASSESSMENT & PLAN: 69 yo female    1. Anal Squamous Cell Carcinoma, cT2N0M0 clinical stage IIA -She was diagnosed in 05/2019.She presented with rectal bleeding and anal pressure.Biopsy showeda 3 cm mass at the anorectal junction, path confirmedwell to moderately differentiated squamous cell carcinoma.10/5/20PET scan shows no metastasis. -S/p curative intent chemoRT with mitomycin and 5FUcompletedon 08/09/19 -her rectal pain improved after treatment, and rectal bleeding and constipation resolved shortly after chemoRT.  -From 12/20 to 2/21 she has recurrent constipation, intermittent rectal bleeding, and stool leakage. Her rectal pain worsened  -rectal exam on 10/25/19 revealed no palpable anal mass.  -Due to her symptoms I obtained PET scan on 11/01/19 which shows marked interval decrease in hypermetabolism of the primary anal mass and resolution of the perirectal lymph nodes. No evidence of metastatic disease in the chest, abdomen or pelvis. Overall great response to treatment. I reviewed this with her today.  -Cynthia Velasquez appears stable. Her bowel issues and rectal pain are improved. She continues PT.  -We discussed surveillance. She will see surgeon for anoscopy on 11/15/19.  -We will see her back for lab and surveillance f/u in 02/2020, or sooner if needed  -she knows to call sooner if she develops recurrent anorectal pain, change in bowel habits, rectal bleeding, vague abdominal pain, unexplained fatigue, or unintentional weight loss.   2. Rectal pain, rectal bleeding, constipation  -she presented with rectal pain, bleeding and constipation. Bleeding and constipation resolved after she completed chemoRT, and rectal pain improved -constipation, rectal pain worsened  from 12/20 to 2/21. She takes mostly tylenol but rarely requires dilaudid. I encouraged her to try to wean off.  -continues alternating miralax and linzess for constipation, stools more formed lately but still has some leakage, continues PT -overall improved   3. UTI ? dysuria, Low urine output -UA on 11/18 showed positive nitrites, trace leukocytes, 11-20 WBC and bacteria. S/p cipro x3 days. Repeat UA 08/09/19 was negative -dysuria and vaginal irritation improved overtime with pyridium and pelvic PT -since last visit she notes vaginal leakage with foul odor, burning, and lower abdominal pain. Afebrile.  -she  will return 2/17 for UA, I will f/u with the results. If UA/culture negative, this is possibly radiation cystitis   4. Low/sacral back pain -she reports this is chronic issue that comes and goes -she had significant tenderness to palpation in low back near sacrum on 1/11. She notes this is not new for her.  -improved with PT  5. H/o bilateral breast cancer -2007: right breast biopsy showed atypical hyperplasia, s/p right lumpectomy in 06/2006 ER/PR+/HER2-, grade 1. Completed tamoxifen from 09/2007 - 09/2012 -2008: left breast invasive ductal carcinoma, triple negative, grade 3 s/p lumpectomy in 07/2007 and eventual bilateral mastectomy; s/p adjuvant chemo AC-T and radiation   6. Genetics -patient is adopted, no known family history  -she reportedly had genetic testing which was negative. Her daughter also negative. I do not have the report.  7. HTN -on losartan -monitoring   8.Pancytopenia  -secondary to chemo, and iron deficiency. -iron studies are adequateon 10/25/19, ferritin 76 -mild leukopenia and anemia are stable, plt improving -continue monitoring   PLAN: -PET scan reviewed -UA/culture on 11/03/19, will call her with results -See colorectal surgeon 3/1 for anoscopy to r/o residual disease   -Continue PT -Return to [facility] for surveillance visit 02/2020   No  problem-specific Assessment & Plan notes found for this encounter.   No orders of the defined types were placed in this encounter.  All questions were answered. The patient knows to call the clinic with any problems, questions or concerns. No barriers to learning were detected. She has been instructed to return for [facility] for UA/lab visit 11/03/19. She knows to call back if symptoms worsen or fail to improve.  I spent 12 minutes on today's phone call, and more than 50% was on counseling and review of test results.     Alla Feeling, NP 11/02/19

## 2019-11-02 ENCOUNTER — Encounter: Payer: Self-pay | Admitting: Nurse Practitioner

## 2019-11-02 ENCOUNTER — Encounter: Payer: Self-pay | Admitting: Physical Therapy

## 2019-11-02 ENCOUNTER — Ambulatory Visit: Payer: PPO | Admitting: Physical Therapy

## 2019-11-02 ENCOUNTER — Inpatient Hospital Stay (HOSPITAL_BASED_OUTPATIENT_CLINIC_OR_DEPARTMENT_OTHER): Payer: PPO | Admitting: Nurse Practitioner

## 2019-11-02 ENCOUNTER — Telehealth: Payer: Self-pay | Admitting: Nurse Practitioner

## 2019-11-02 DIAGNOSIS — M6281 Muscle weakness (generalized): Secondary | ICD-10-CM

## 2019-11-02 DIAGNOSIS — R300 Dysuria: Secondary | ICD-10-CM

## 2019-11-02 DIAGNOSIS — R279 Unspecified lack of coordination: Secondary | ICD-10-CM

## 2019-11-02 DIAGNOSIS — C210 Malignant neoplasm of anus, unspecified: Secondary | ICD-10-CM

## 2019-11-02 NOTE — Therapy (Signed)
[facility] [address] [place], [place], [address] Phone: [phone]   Fax:  [phone]  Physical Therapy Treatment  Patient Details  Name: Cynthia Velasquez MRN: [id number] Date of Birth: 06-22-1951 Referring Provider (PT): Truitt Merle, MD   Encounter Date: 11/02/2019  PT End of Session - 11/02/19 1022    Visit Number  6    Date for PT Re-Evaluation  12/28/19    PT Start Time  1016    PT Stop Time  1057    PT Time Calculation (min)  41 min    Activity Tolerance  Patient tolerated treatment well    Behavior During Therapy  [facility] for tasks assessed/performed       Past Medical History:  Diagnosis Date  . Anxiety   . Arthritis   . Bilateral breast cancer ([place]) 10/21/2013  . Breast cancer ([place])   . Cancer ([place])   . Depression   . Gallstones   . GERD (gastroesophageal reflux disease)    doesn't take any meds for this  . Headache    h/o migraines      . History of bladder infections   . History of hiatal hernia   . History of migraine   . Hyperlipidemia    takes Simvastatin daily  . Hypertension    takes Hyzaar  . Joint pain   . Joint swelling   . Lumbar stenosis   . Neuropathy 09/07/2013  . Pneumonia   . Pre-diabetes     Past Surgical History:  Procedure Laterality Date  . ABDOMINAL HYSTERECTOMY    . bone spur removed from left foot    . CHOLECYSTECTOMY    . COLONOSCOPY    . double mastectomy     . HERNIA REPAIR     umbilical  . right knee arthroscopy    . right shoulder arthroscopy    . surgery for hiatal hernia    . TONSILLECTOMY    . TOTAL HIP ARTHROPLASTY Left 02/12/2013  . TOTAL HIP ARTHROPLASTY Left 02/12/2013   Procedure: LEFT TOTAL HIP ARTHROPLASTY;  Surgeon: Kerin Salen, MD;  Location: [place];  Service: Orthopedics;  Laterality: Left;  . TOTAL HIP ARTHROPLASTY Right 05/03/2013   Procedure: TOTAL HIP ARTHROPLASTY;  Surgeon: Kerin Salen, MD;  Location: [place];  Service: Orthopedics;   Laterality: Right;  . TOTAL KNEE ARTHROPLASTY Right 02/27/2015   Procedure: TOTAL KNEE ARTHROPLASTY;  Surgeon: Frederik Pear, MD;  Location: [place];  Service: Orthopedics;  Laterality: Right;  . TOTAL SHOULDER ARTHROPLASTY Right 09/12/2016   Procedure: RIGHT TOTAL SHOULDER ARTHROPLASTY;  Surgeon: Tania Ade, MD;  Location: [place];  Service: Orthopedics;  Laterality: Right;  RIGHT TOTAL SHOULDER ARTHROPLASTY    There were no vitals filed for this visit.  Subjective Assessment - 11/02/19 1020    Subjective  I didn't have the leakage all week and I went up a flight of stairs which I haven't been able to do for over a year. Whatever we did last time helped a lot.    Currently in Pain?  No/denies                       [facility] Adult PT Treatment/Exercise - 11/02/19 0001      Lumbar Exercises: Stretches   Figure 4 Stretch  3 reps;20 seconds    Other Lumbar Stretch Exercise  leg lengthening 10x 5 sec bilat      Manual Therapy   Manual  therapy comments  gluteals in sidelying STM to bilat gluteals    Myofascial Release  thoracolumbar pulls - gluteals bilateral fascial release then moving into musle tissue - TTP around GT muslce attachments     Manual Traction  sacral traction               PT Short Term Goals - 10/15/19 1021      PT SHORT TERM GOAL #1   Title  ind with initial HEP    Status  Achieved      PT SHORT TERM GOAL #2   Title  pt will report 30% less leakage    Status  On-going        PT Long Term Goals - 10/15/19 1021      PT LONG TERM GOAL #1   Title  Pt will be able to reduce leakage and use only 1-2 pads/day    Baseline  don't have the sensation, same    Status  On-going      PT LONG TERM GOAL #2   Title  Pt will report no nocturia    Baseline  leakage at night    Status  On-going      PT LONG TERM GOAL #3   Title  Pt will report she can feel the urge to urinate at least 90% of the time due to improved health of pelvic floor muscles     Baseline  leaks all throughout the day without any awareness of need to void    Status  On-going      PT LONG TERM GOAL #4   Title  Pt will report at least 60% less leakage due to improved pelvic strength    Baseline  still occurs    Status  On-going            Plan - 11/02/19 1100    Clinical Impression Statement  Pt made good progress since previous visit.  She had less TTP throughout glutes and could tolerate more pressure with STM.  Pt had no leakage last week.  Pt will benefit from skilled PT to porgress lumbar ROM and work towards improved core and hip strength for improved functional activities.    PT Treatment/Interventions  ADLs/Self Care Home Management;Biofeedback;Electrical Stimulation;Cryotherapy;Moist Heat;Functional mobility training;Therapeutic activities;Therapeutic exercise;Patient/family education;Neuromuscular re-education;Taping;Dry needling;Manual techniques    PT Next Visit Plan  lumbar flexion stretch, TrA and pelvic floor exercises, internal STM as needed, biofeedback    PT Home Exercise Plan  Access Code: [email or web address]    Consulted and Agree with Plan of Care  Patient       Patient will benefit from skilled therapeutic intervention in order to improve the following deficits and impairments:  Increased muscle spasms, Impaired tone, Decreased range of motion, Decreased strength, Increased fascial restricitons, Impaired flexibility, Postural dysfunction, Pain  Visit Diagnosis: Muscle weakness (generalized)  Unspecified lack of coordination     Problem List Patient Active Problem List   Diagnosis Date Noted  . Anal cancer ([place]) 06/13/2019  . Hematuria, gross 03/11/2017  . Status post total shoulder arthroplasty, right 09/12/2016  . Arthritis of knee 02/27/2015  . Primary osteoarthritis of right knee Valgus 02/24/2015  . Neuropathy 09/07/2013  . Breast cancer of upper-outer quadrant of left female breast ([place]) 08/09/2013  . Breast cancer of upper-outer  quadrant of right female breast ([place]) 08/09/2013  . Osteoarthritis of right hip 05/03/2013  . Osteoarthritis of left hip 02/15/2013    Camillo Flaming Tayli Buch, PT 11/02/2019, 11:02 AM  [facility] [address], [place], [place], [address] Phone: [phone]   Fax:  [phone]  Name: Cynthia Velasquez MRN: [id number] Date of Birth: 1951/03/05

## 2019-11-02 NOTE — Telephone Encounter (Deleted)
No los per 2/15.

## 2019-11-02 NOTE — Telephone Encounter (Signed)
No los per 2/16.

## 2019-11-03 ENCOUNTER — Inpatient Hospital Stay: Payer: PPO

## 2019-11-03 ENCOUNTER — Telehealth: Payer: Self-pay | Admitting: Nurse Practitioner

## 2019-11-03 ENCOUNTER — Other Ambulatory Visit: Payer: Self-pay

## 2019-11-03 DIAGNOSIS — C210 Malignant neoplasm of anus, unspecified: Secondary | ICD-10-CM | POA: Diagnosis not present

## 2019-11-03 DIAGNOSIS — R300 Dysuria: Secondary | ICD-10-CM

## 2019-11-03 DIAGNOSIS — I517 Cardiomegaly: Secondary | ICD-10-CM

## 2019-11-03 LAB — URINALYSIS, COMPLETE (UACMP) WITH MICROSCOPIC
Bilirubin Urine: NEGATIVE
Glucose, UA: NEGATIVE mg/dL
Ketones, ur: NEGATIVE mg/dL
Nitrite: NEGATIVE
Protein, ur: NEGATIVE mg/dL
Specific Gravity, Urine: 1.018 (ref 1.005–1.030)
pH: 5 (ref 5.0–8.0)

## 2019-11-03 MED ORDER — CIPROFLOXACIN HCL 500 MG PO TABS: 500.0000 mg | ORAL_TABLET | Freq: Every day | ORAL | 0 refills | Status: DC

## 2019-11-03 NOTE — Telephone Encounter (Signed)
I spoke to Ms. Gerilyn Nestle when she was in lab for UA today to let her know of incidental finding of cardiomegaly on PET from 11/01/19, which was also seen on 06/20/20 with initial staging work up. She has no prior cardiac history except HTN. We are referring her for echo, she agrees.   UA suggestive of UTI, will start empiric antibiotic with Cipro which she responded to, has not taken in 3 months. I will call in to her pharmacy. Will follow culture.   She understands the above and appreciates the call.   Cira Rue, NP

## 2019-11-03 NOTE — Progress Notes (Signed)
  Radiation Oncology         [phone] ________________________________  Name: Cynthia Velasquez MRN: [id number]  Date: 08/09/2019  DOB: 1950-10-30  End of Treatment Note  Diagnosis: anal cancer     Indication for treatment:  curative       Radiation treatment dates:   06/28/19 - 08/09/19  Site/dose:   The patient was treated with a course of IMRT using a simultaneous integrated boost technique. Daily image guidance was using during the treatment. The high dose region received a total of 54 Gy.  Narrative: The patient tolerated radiation treatment relatively well.   The patient experienced skin irritation as expected by the end of treatment.   Plan: The patient has completed radiation treatment. The patient will return to radiation oncology clinic for routine followup in one month. I advised the patient to call or return sooner if they have any questions or concerns related to their recovery or treatment. ________________________________  Jodelle Gross, M.D., Ph.D.

## 2019-11-04 ENCOUNTER — Telehealth: Payer: Self-pay | Admitting: Emergency Medicine

## 2019-11-04 LAB — URINE CULTURE: Culture: <10000 — AB

## 2019-11-04 NOTE — Telephone Encounter (Signed)
-----   Message from Alla Feeling, NP sent at 11/04/2019 10:31 AM EST ----- Please let her know the urine culture showed insignificant growth, no urinary tract infection. Irritation might be from radiation. She can stop the antibiotic and try Azo (OTC) PRN for pain. Encourage her to increase hydration.   Thanks, Regan Rakers

## 2019-11-04 NOTE — Telephone Encounter (Signed)
Called pt regarding results of urine culture, no answer.  Left VM relaying results and NP Lacie's recommendations with call back number.

## 2019-11-09 ENCOUNTER — Encounter: Payer: Self-pay | Admitting: Physical Therapy

## 2019-11-09 ENCOUNTER — Other Ambulatory Visit: Payer: Self-pay

## 2019-11-09 ENCOUNTER — Ambulatory Visit: Payer: PPO | Admitting: Physical Therapy

## 2019-11-09 DIAGNOSIS — M6281 Muscle weakness (generalized): Secondary | ICD-10-CM

## 2019-11-09 DIAGNOSIS — R279 Unspecified lack of coordination: Secondary | ICD-10-CM

## 2019-11-09 NOTE — Therapy (Signed)
[facility] [address] [place], [place], [address] Phone: [phone]   Fax:  [phone]  Physical Therapy Treatment  Patient Details  Name: Cynthia Velasquez MRN: [id number] Date of Birth: [birth year] Referring Provider (PT): Truitt Merle, MD   Encounter Date: 11/09/2019  PT End of Session - 11/09/19 0934    Visit Number  7    Date for PT Re-Evaluation  12/28/19    PT Start Time  0932    PT Stop Time  1012    PT Time Calculation (min)  40 min    Activity Tolerance  Patient tolerated treatment well    Behavior During Therapy  [facility] for tasks assessed/performed       Past Medical History:  Diagnosis Date  . Anxiety   . Arthritis   . Bilateral breast cancer ([place]) 10/21/2013  . Breast cancer ([place])   . Cancer ([place])   . Depression   . Gallstones   . GERD (gastroesophageal reflux disease)    doesn't take any meds for this  . Headache    h/o migraines      . History of bladder infections   . History of hiatal hernia   . History of migraine   . Hyperlipidemia    takes Simvastatin daily  . Hypertension    takes Hyzaar  . Joint pain   . Joint swelling   . Lumbar stenosis   . Neuropathy 09/07/2013  . Pneumonia   . Pre-diabetes     Past Surgical History:  Procedure Laterality Date  . ABDOMINAL HYSTERECTOMY    . bone spur removed from left foot    . CHOLECYSTECTOMY    . COLONOSCOPY    . double mastectomy     . HERNIA REPAIR     umbilical  . right knee arthroscopy    . right shoulder arthroscopy    . surgery for hiatal hernia    . TONSILLECTOMY    . TOTAL HIP ARTHROPLASTY Left 02/12/2013  . TOTAL HIP ARTHROPLASTY Left 02/12/2013   Procedure: LEFT TOTAL HIP ARTHROPLASTY;  Surgeon: Kerin Salen, MD;  Location: [place];  Service: Orthopedics;  Laterality: Left;  . TOTAL HIP ARTHROPLASTY Right 05/03/2013   Procedure: TOTAL HIP ARTHROPLASTY;  Surgeon: Kerin Salen, MD;  Location: [place];  Service: Orthopedics;   Laterality: Right;  . TOTAL KNEE ARTHROPLASTY Right 02/27/2015   Procedure: TOTAL KNEE ARTHROPLASTY;  Surgeon: Frederik Pear, MD;  Location: [place];  Service: Orthopedics;  Laterality: Right;  . TOTAL SHOULDER ARTHROPLASTY Right 09/12/2016   Procedure: RIGHT TOTAL SHOULDER ARTHROPLASTY;  Surgeon: Tania Ade, MD;  Location: [place];  Service: Orthopedics;  Laterality: Right;  RIGHT TOTAL SHOULDER ARTHROPLASTY    There were no vitals filed for this visit.  Subjective Assessment - 11/09/19 0936    Subjective  I woke up more stiff.  I got the medicine for the UTI but then the doctor said it wasn't a UTI but I had already taken the medicine and it cleared up    Currently in Pain?  Yes    Pain Score  5     Pain Location  Hip    Pain Orientation  Mid;Left    Pain Descriptors / Indicators  Aching    Pain Type  Chronic pain    Pain Radiating Towards  gluteals/low back on Lt    Pain Onset  More than a month ago    Pain Frequency  Intermittent  Aggravating Factors   unknown    Pain Relieving Factors  massage    Multiple Pain Sites  No                       [facility] Adult PT Treatment/Exercise - 11/09/19 0001      Lumbar Exercises: Stretches   Figure 4 Stretch  3 reps;20 seconds      Lumbar Exercises: Seated   Other Seated Lumbar Exercises  ball roll out lumbar flexion and sidebending - 5x 10 sec    Other Seated Lumbar Exercises  clam green band - 20x      Manual Therapy   Manual Therapy  Myofascial release;Soft tissue mobilization    Manual therapy comments  pt identity confirmed and informed consent given to perform soft tissue internally    Soft tissue mobilization  Lt gluteals and adductors    Myofascial Release  adductors    Internal Pelvic Floor  external to ischiocavernosis and MFR around perineum               PT Short Term Goals - 10/15/19 1021      PT SHORT TERM GOAL #1   Title  ind with initial HEP    Status  Achieved      PT SHORT TERM GOAL #2    Title  pt will report 30% less leakage    Status  On-going        PT Long Term Goals - 10/15/19 1021      PT LONG TERM GOAL #1   Title  Pt will be able to reduce leakage and use only 1-2 pads/day    Baseline  don't have the sensation, same    Status  On-going      PT LONG TERM GOAL #2   Title  Pt will report no nocturia    Baseline  leakage at night    Status  On-going      PT LONG TERM GOAL #3   Title  Pt will report she can feel the urge to urinate at least 90% of the time due to improved health of pelvic floor muscles    Baseline  leaks all throughout the day without any awareness of need to void    Status  On-going      PT LONG TERM GOAL #4   Title  Pt will report at least 60% less leakage due to improved pelvic strength    Baseline  still occurs    Status  On-going            Plan - 11/09/19 1056    Clinical Impression Statement  Pt was more sore and stiff today.  Pt was able to tolerate soft tissue work but still very tender to external sof tissue and perineal fascia.  Pt had good response to soft tissue and still making progress overal with less leakage. Pt will benefit form skilled PT to continue to work towards functional goals.    PT Treatment/Interventions  ADLs/Self Care Home Management;Biofeedback;Electrical Stimulation;Cryotherapy;Moist Heat;Functional mobility training;Therapeutic activities;Therapeutic exercise;Patient/family education;Neuromuscular re-education;Taping;Dry needling;Manual techniques    PT Next Visit Plan  lumbar flexion stretch, TrA and pelvic floor exercises, internal STM as needed, biofeedback    PT Home Exercise Plan  Access Code: [email or web address]    Consulted and Agree with Plan of Care  Patient       Patient will benefit from skilled therapeutic intervention in order to improve the following deficits and impairments:  Increased muscle spasms, Impaired tone, Decreased range of motion, Decreased strength, Increased fascial restricitons, Impaired  flexibility, Postural dysfunction, Pain  Visit Diagnosis: Muscle weakness (generalized)  Unspecified lack of coordination     Problem List Patient Active Problem List   Diagnosis Date Noted  . Anal cancer ([place]) 06/13/2019  . Hematuria, gross 03/11/2017  . Status post total shoulder arthroplasty, right 09/12/2016  . Arthritis of knee 02/27/2015  . Primary osteoarthritis of right knee Valgus 02/24/2015  . Neuropathy 09/07/2013  . Breast cancer of upper-outer quadrant of left female breast ([place]) 08/09/2013  . Breast cancer of upper-outer quadrant of right female breast ([place]) 08/09/2013  . Osteoarthritis of right hip 05/03/2013  . Osteoarthritis of left hip 02/15/2013    Jule Ser, PT 11/09/2019, 10:58 AM  [facility] [address], [place], [place], [address] Phone: [phone]   Fax:  [phone]  Name: Cynthia Velasquez MRN: [id number] Date of Birth: [birth year]

## 2019-11-10 ENCOUNTER — Encounter: Payer: Self-pay | Admitting: Nurse Practitioner

## 2019-11-15 DIAGNOSIS — K581 Irritable bowel syndrome with constipation: Secondary | ICD-10-CM | POA: Diagnosis not present

## 2019-11-15 DIAGNOSIS — K430 Incisional hernia with obstruction, without gangrene: Secondary | ICD-10-CM | POA: Diagnosis not present

## 2019-11-15 DIAGNOSIS — C211 Malignant neoplasm of anal canal: Secondary | ICD-10-CM | POA: Diagnosis not present

## 2019-11-16 ENCOUNTER — Encounter: Payer: Self-pay | Admitting: Physical Therapy

## 2019-11-16 ENCOUNTER — Other Ambulatory Visit: Payer: Self-pay

## 2019-11-16 ENCOUNTER — Ambulatory Visit: Payer: 59 | Attending: Hematology | Admitting: Physical Therapy

## 2019-11-16 DIAGNOSIS — M6281 Muscle weakness (generalized): Secondary | ICD-10-CM | POA: Diagnosis not present

## 2019-11-16 DIAGNOSIS — R279 Unspecified lack of coordination: Secondary | ICD-10-CM | POA: Insufficient documentation

## 2019-11-16 NOTE — Therapy (Signed)
[facility] [address], [place], [place], [address] Phone: [phone]   Fax:  [phone]  Physical Therapy Treatment  Patient Details  Name: Cynthia Velasquez MRN: [id number] Date of Birth: 08-11-1951 Referring Provider (PT): Truitt Merle, MD   Encounter Date: 11/16/2019  PT End of Session - 11/16/19 1026    Visit Number  8    Date for PT Re-Evaluation  12/28/19    PT Start Time  0932    PT Stop Time  1017    PT Time Calculation (min)  45 min    Activity Tolerance  Patient tolerated treatment well;Patient limited by pain    Behavior During Therapy  [facility] for tasks assessed/performed       Past Medical History:  Diagnosis Date  . Anxiety   . Arthritis   . Bilateral breast cancer ([place]) 10/21/2013  . Breast cancer ([place])   . Cancer ([place])   . Depression   . Gallstones   . GERD (gastroesophageal reflux disease)    doesn't take any meds for this  . Headache    h/o migraines      . History of bladder infections   . History of hiatal hernia   . History of migraine   . Hyperlipidemia    takes Simvastatin daily  . Hypertension    takes Hyzaar  . Joint pain   . Joint swelling   . Lumbar stenosis   . Neuropathy 09/07/2013  . Pneumonia   . Pre-diabetes     Past Surgical History:  Procedure Laterality Date  . ABDOMINAL HYSTERECTOMY    . bone spur removed from left foot    . CHOLECYSTECTOMY    . COLONOSCOPY    . double mastectomy     . HERNIA REPAIR     umbilical  . right knee arthroscopy    . right shoulder arthroscopy    . surgery for hiatal hernia    . TONSILLECTOMY    . TOTAL HIP ARTHROPLASTY Left 02/12/2013  . TOTAL HIP ARTHROPLASTY Left 02/12/2013   Procedure: LEFT TOTAL HIP ARTHROPLASTY;  Surgeon: Kerin Salen, MD;  Location: [place];  Service: Orthopedics;  Laterality: Left;  . TOTAL HIP ARTHROPLASTY Right 05/03/2013   Procedure: TOTAL HIP ARTHROPLASTY;  Surgeon: Kerin Salen, MD;  Location: [place];   Service: Orthopedics;  Laterality: Right;  . TOTAL KNEE ARTHROPLASTY Right 02/27/2015   Procedure: TOTAL KNEE ARTHROPLASTY;  Surgeon: Frederik Pear, MD;  Location: [place];  Service: Orthopedics;  Laterality: Right;  . TOTAL SHOULDER ARTHROPLASTY Right 09/12/2016   Procedure: RIGHT TOTAL SHOULDER ARTHROPLASTY;  Surgeon: Tania Ade, MD;  Location: [place];  Service: Orthopedics;  Laterality: Right;  RIGHT TOTAL SHOULDER ARTHROPLASTY    There were no vitals filed for this visit.  Subjective Assessment - 11/16/19 0935    Subjective  Pt states she had rectal exam and she is still in a lot of pain and reports she had a lot of difficulty with bowel movements after that.    Patient Stated Goals  leakage, energy levels, being able to sleep at night (cannot sleep out of fear)    Currently in Pain?  No/denies         [facility] PT Assessment - 11/16/19 0001      Assessment   Medical Diagnosis  C21.0 (ICD-10-CM) - Anal cancer [facility])    Referring Provider (PT)  Truitt Merle, MD  [facility] Adult PT Treatment/Exercise - 11/16/19 0001      Self-Care   Self-Care  Other Self-Care Comments    Other Self-Care Comments   toileting techiques instructed and practice techniques in seated      Neuro Re-ed    Neuro Re-ed Details   rocking and breathing on towel roll - TC      Manual Therapy   Myofascial Release  thoracolumbar pulls - gluteals bilateral fascial release then moving into musle tissue - TTP around GT muslce attachments     Manual Traction  sacral traction             PT Education - 11/16/19 1023    Education Details  toileting techniques    Person(s) Educated  Patient    Methods  Explanation;Demonstration;Handout;Verbal cues    Comprehension  Verbalized understanding;Returned demonstration       PT Short Term Goals - 11/16/19 1035      PT SHORT TERM GOAL #2   Title  pt will report 30% less leakage    Status  Achieved        PT Long Term Goals - 11/16/19  [id number]      PT LONG TERM GOAL #1   Title  Pt will be able to reduce leakage and use only 1-2 pads/day    Baseline  same amount of pads    Time  12    Period  Weeks    Status  On-going    Target Date  02/08/20      PT LONG TERM GOAL #2   Title  Pt will report no nocturia    Baseline  leakage at night, but not a lot and I can't tell that I have to go    Time  12    Period  Weeks    Status  On-going    Target Date  02/08/20      PT LONG TERM GOAL #3   Title  Pt will report she can feel the urge to urinate at least 90% of the time due to improved health of pelvic floor muscles    Time  12    Period  Weeks    Status  On-going    Target Date  02/08/20      PT LONG TERM GOAL #4   Title  Pt will report at least 60% less leakage due to improved pelvic strength    Baseline  90% better (then states that she is talking about when she can feel the urge)    Time  12    Period  Weeks    Status  On-going    Target Date  02/08/20      PT LONG TERM GOAL #5   Title  Pt will be able to exercise consistently for at least 20 minutes 4-5 days/week due to improved function and energy levels    Baseline  falls asleep throughout the day and feels no energy    Time  12    Period  Weeks    Status  New    Target Date  02/08/20            Plan - 11/16/19 1031    Clinical Impression Statement  Pt has made progress overall, but she has very sensative muscles and soft tissue throughout pelvic floor and low back and gluteals.  Pt is progressing very slowly and needs more time for soft tissue work throughout pelvic floor in order to be able to  progress strength.  At this time PT is recommending to extend the plan of care and work with patient for an additional day/week so we can make better progress towards her functional goals.    Personal Factors and Comorbidities  Comorbidity 3+    Comorbidities  chemo, radiation, hx of breast cancer    Examination-Activity Limitations  Toileting;Continence     Examination-Participation Restrictions  Community Activity    Stability/Clinical Decision Making  Evolving/Moderate complexity    Clinical Decision Making  Moderate    Rehab Potential  Excellent    PT Frequency  2x / week    PT Duration  12 weeks    PT Treatment/Interventions  ADLs/Self Care Home Management;Biofeedback;Electrical Stimulation;Cryotherapy;Moist Heat;Functional mobility training;Therapeutic activities;Therapeutic exercise;Patient/family education;Neuromuscular re-education;Taping;Dry needling;Manual techniques    PT Next Visit Plan  nustep, internal STM for scar tissue, basic pelvic and core strength    PT Home Exercise Plan  Access Code: [email or web address]    Consulted and Agree with Plan of Care  Patient       Patient will benefit from skilled therapeutic intervention in order to improve the following deficits and impairments:  Increased muscle spasms, Impaired tone, Decreased range of motion, Decreased strength, Increased fascial restricitons, Impaired flexibility, Postural dysfunction, Pain  Visit Diagnosis: Muscle weakness (generalized)  Unspecified lack of coordination     Problem List Patient Active Problem List   Diagnosis Date Noted  . Anal cancer ([place]) 06/13/2019  . Hematuria, gross 03/11/2017  . Status post total shoulder arthroplasty, right 09/12/2016  . Arthritis of knee 02/27/2015  . Primary osteoarthritis of right knee Valgus 02/24/2015  . Neuropathy 09/07/2013  . Breast cancer of upper-outer quadrant of left female breast ([place]) 08/09/2013  . Breast cancer of upper-outer quadrant of right female breast ([place]) 08/09/2013  . Osteoarthritis of right hip 05/03/2013  . Osteoarthritis of left hip 02/15/2013    Jule Ser, PT 11/16/2019, 11:32 AM  [facility] [address] [place], [place], [address] Phone: [phone]   Fax:  [phone]  Name: Cynthia Velasquez MRN: [id number] Date of Birth:  [birth year]

## 2019-11-16 NOTE — Patient Instructions (Addendum)
Toileting Techniques for Bowel Movements (Defecation) Using your belly (abdomen) and pelvic floor muscles to have a bowel movement is usually instinctive.  Sometimes people can have problems with these muscles and have to relearn proper defecation (emptying) techniques.  If you have weakness in your muscles, organs that are falling out, decreased sensation in your pelvis, or ignore your urge to go, you may find yourself straining to have a bowel movement.  You are straining if you are: . holding your breath or taking in a huge gulp of air and holding it  . keeping your lips and jaw tensed and closed tightly . turning red in the face because of excessive pushing or forcing . developing or worsening your  hemorrhoids . getting faint while pushing . not emptying completely and have to defecate many times a day  If you are straining, you are actually making it harder for yourself to have a bowel movement.  Many people find they are pulling up with the pelvic floor muscles and closing off instead of opening the anus. Due to lack pelvic floor relaxation and coordination the abdominal muscles, one has to work harder to push the feces out.  Many people have never been taught how to defecate efficiently and effectively.  Notice what happens to your body when you are having a bowel movement.  While you are sitting on the toilet pay attention to the following areas: . Jaw and mouth position . Angle of your hips   . Whether your feet touch the ground or not . Arm placement  . Spine position . Waist . Belly tension . Anus (opening of the anal canal)  An Evacuation/Defecation Plan   Here are the 4 basic points:  1. Lean forward enough for your elbows to rest on your knees 2. Support your feet on the floor or use a low stool if your feet don't touch the floor  3. Push out your belly as if you have swallowed a beach ball-you should feel a widening of your waist 4. Open and relax your pelvic floor muscles,  rather than tightening around the anus      The following conditions my require modifications to your toileting posture:  . If you have had surgery in the past that limits your back, hip, pelvic, knee or ankle flexibility . Constipation   Your healthcare practitioner may make the following additional suggestions and adjustments:  1) Sit on the toilet  a) Make sure your feet are supported. b) Notice your hip angle and spine position-most people find it effective to lean forward or raise their knees, which can help the muscles around the anus to relax  c) When you lean forward, place your forearms on your thighs for support  2) Relax suggestions a) Breath deeply in through your nose and out slowly through your mouth as if you are smelling the flowers and blowing out the candles. b) To become aware of how to relax your muscles, contracting and releasing muscles can be helpful.  Pull your pelvic floor muscles in tightly by using the image of holding back gas, or closing around the anus (visualize making a circle smaller) and lifting the anus up and in.  Then release the muscles and your anus should drop down and feel open. Repeat 5 times ending with the feeling of relaxation. c) Keep your pelvic floor muscles relaxed; let your belly bulge out. d) The digestive tract starts at the mouth and ends at the anal opening, so be   sure to relax both ends of the tube.  Place your tongue on the roof of your mouth with your teeth separated.  This helps relax your mouth and will help to relax the anus at the same time.  3) Empty (defecation) a) Keep your pelvic floor and sphincter relaxed, then bulge your anal muscles.  Make the anal opening wide.  b) Stick your belly out as if you have swallowed a beach ball. c) Make your belly wall hard using your belly muscles while continuing to breathe. Doing this makes it easier to open your anus. d) Breath out and give a grunt (or try using other sounds such as  ahhhh, shhhhh, ohhhh or grrrrrrr).  4) Finish a) As you finish your bowel movement, pull the pelvic floor muscles up and in.  This will leave your anus in the proper place rather than remaining pushed out and down. If you leave your anus pushed out and down, it will start to feel as though that is normal and give you incorrect signals about needing to have a bowel movement.   [facility] [address] [place], [place] [address]  

## 2019-11-18 ENCOUNTER — Telehealth: Payer: Self-pay | Admitting: *Deleted

## 2019-11-18 NOTE — Telephone Encounter (Signed)
FYI  [employer] leave provided fax number to send new Physician Statement for Medical Oncologist to sign.  Requested recent Medical Oncology office visit notes, physical therapy notes to extend leave that ended 11/14/2019 for March 1st going forward.  Current treatment plan update needed.  Voicemail requesting:    1. 11/15/2019 office notes from visit with unnamed surgeon.  2. Confirm continued treatment. 3. Provide possible return to work date 84. Work status information.  Leave number: [id number] is only under [name] per [place].    Advised Nadalyn Ringstad completed radiation November 2020, surgeons do not work with [facility] for records, office appointment information or notes.

## 2019-11-19 DIAGNOSIS — M150 Primary generalized (osteo)arthritis: Secondary | ICD-10-CM | POA: Diagnosis not present

## 2019-11-19 DIAGNOSIS — I10 Essential (primary) hypertension: Secondary | ICD-10-CM | POA: Diagnosis not present

## 2019-11-19 DIAGNOSIS — Z853 Personal history of malignant neoplasm of breast: Secondary | ICD-10-CM | POA: Diagnosis not present

## 2019-11-19 DIAGNOSIS — F331 Major depressive disorder, recurrent, moderate: Secondary | ICD-10-CM | POA: Diagnosis not present

## 2019-11-19 DIAGNOSIS — E7800 Pure hypercholesterolemia, unspecified: Secondary | ICD-10-CM | POA: Diagnosis not present

## 2019-11-19 DIAGNOSIS — F329 Major depressive disorder, single episode, unspecified: Secondary | ICD-10-CM | POA: Diagnosis not present

## 2019-11-22 ENCOUNTER — Telehealth: Payer: Self-pay

## 2019-11-22 NOTE — Telephone Encounter (Signed)
Received voicemail from [place] at [employer] leave and disability in regard to paperwork that was faxed over on Friday to be filled out today and sent back. I let Roslind RN know about request of information being sent back to them today at fax# [phone] and let her know that the number to reach Roselyn Reef is [phone]

## 2019-11-23 ENCOUNTER — Ambulatory Visit: Payer: 59 | Admitting: Physical Therapy

## 2019-11-23 ENCOUNTER — Encounter: Payer: Self-pay | Admitting: Physical Therapy

## 2019-11-23 ENCOUNTER — Other Ambulatory Visit: Payer: Self-pay

## 2019-11-23 DIAGNOSIS — M6281 Muscle weakness (generalized): Secondary | ICD-10-CM

## 2019-11-23 DIAGNOSIS — R279 Unspecified lack of coordination: Secondary | ICD-10-CM

## 2019-11-23 NOTE — Therapy (Signed)
[facility] [address], [place], [place], [address] Phone: [phone]   Fax:  [phone]  Physical Therapy Treatment  Patient Details  Name: Cynthia Velasquez MRN: [id number] Date of Birth: [birth year] Referring Provider (PT): Truitt Merle, MD   Encounter Date: 11/23/2019  PT End of Session - 11/23/19 0935    Visit Number  9    Date for PT Re-Evaluation  12/28/19    PT Start Time  0933    PT Stop Time  1020    PT Time Calculation (min)  47 min    Activity Tolerance  Patient tolerated treatment well;Patient limited by pain    Behavior During Therapy  [facility] for tasks assessed/performed       Past Medical History:  Diagnosis Date  . Anxiety   . Arthritis   . Bilateral breast cancer ([place]) 10/21/2013  . Breast cancer ([place])   . Cancer ([place])   . Depression   . Gallstones   . GERD (gastroesophageal reflux disease)    doesn't take any meds for this  . Headache    h/o migraines      . History of bladder infections   . History of hiatal hernia   . History of migraine   . Hyperlipidemia    takes Simvastatin daily  . Hypertension    takes Hyzaar  . Joint pain   . Joint swelling   . Lumbar stenosis   . Neuropathy 09/07/2013  . Pneumonia   . Pre-diabetes     Past Surgical History:  Procedure Laterality Date  . ABDOMINAL HYSTERECTOMY    . bone spur removed from left foot    . CHOLECYSTECTOMY    . COLONOSCOPY    . double mastectomy     . HERNIA REPAIR     umbilical  . right knee arthroscopy    . right shoulder arthroscopy    . surgery for hiatal hernia    . TONSILLECTOMY    . TOTAL HIP ARTHROPLASTY Left 02/12/2013  . TOTAL HIP ARTHROPLASTY Left 02/12/2013   Procedure: LEFT TOTAL HIP ARTHROPLASTY;  Surgeon: Kerin Salen, MD;  Location: [place];  Service: Orthopedics;  Laterality: Left;  . TOTAL HIP ARTHROPLASTY Right 05/03/2013   Procedure: TOTAL HIP ARTHROPLASTY;  Surgeon: Kerin Salen, MD;  Location: [facility];   Service: Orthopedics;  Laterality: Right;  . TOTAL KNEE ARTHROPLASTY Right 02/27/2015   Procedure: TOTAL KNEE ARTHROPLASTY;  Surgeon: Frederik Pear, MD;  Location: [place];  Service: Orthopedics;  Laterality: Right;  . TOTAL SHOULDER ARTHROPLASTY Right 09/12/2016   Procedure: RIGHT TOTAL SHOULDER ARTHROPLASTY;  Surgeon: Tania Ade, MD;  Location: [place];  Service: Orthopedics;  Laterality: Right;  RIGHT TOTAL SHOULDER ARTHROPLASTY    There were no vitals filed for this visit.  Subjective Assessment - 11/23/19 0936    Subjective  Pt states the bowel control is improved but there is diffuculty.  Pt states the urinary leakage is happening all throughout the day and cannot tell.    Currently in Pain?  Yes    Pain Score  9     Pain Location  Back    Pain Descriptors / Indicators  Aching    Pain Type  Chronic pain    Pain Onset  More than a month ago    Pain Frequency  Intermittent    Multiple Pain Sites  No  [facility] Adult PT Treatment/Exercise - 11/23/19 0001      Neuro Re-ed    Neuro Re-ed Details   biofeedback; able to hold 2-3 sec at a time faitgues quickly 7-27mV; resting tone 5-6 then down to 2-3 with cues; breathing an dbulging to relax       Lumbar Exercises: Stretches   Figure 4 Stretch  3 reps;20 seconds      Lumbar Exercises: Seated   Other Seated Lumbar Exercises  kegel with red band; kegel in sitting       Lumbar Exercises: Supine   AB Set Limitations  kegel - TC and cues to breathe             PT Education - 11/23/19 1011    Education Details  Access Code: [email or web address]    Person(s) Educated  Patient    Methods  Explanation;Demonstration;Tactile cues;Verbal cues;Handout    Comprehension  Verbalized understanding;Returned demonstration       PT Short Term Goals - 11/16/19 1035      PT SHORT TERM GOAL #2   Title  pt will report 30% less leakage    Status  Achieved        PT Long Term Goals - 11/16/19 [id number]      PT LONG  TERM GOAL #1   Title  Pt will be able to reduce leakage and use only 1-2 pads/day    Baseline  same amount of pads    Time  12    Period  Weeks    Status  On-going    Target Date  02/08/20      PT LONG TERM GOAL #2   Title  Pt will report no nocturia    Baseline  leakage at night, but not a lot and I can't tell that I have to go    Time  12    Period  Weeks    Status  On-going    Target Date  02/08/20      PT LONG TERM GOAL #3   Title  Pt will report she can feel the urge to urinate at least 90% of the time due to improved health of pelvic floor muscles    Time  12    Period  Weeks    Status  On-going    Target Date  02/08/20      PT LONG TERM GOAL #4   Title  Pt will report at least 60% less leakage due to improved pelvic strength    Baseline  90% better (then states that she is talking about when she can feel the urge)    Time  12    Period  Weeks    Status  On-going    Target Date  02/08/20      PT LONG TERM GOAL #5   Title  Pt will be able to exercise consistently for at least 20 minutes 4-5 days/week due to improved function and energy levels    Baseline  falls asleep throughout the day and feels no energy    Time  12    Period  Weeks    Status  New    Target Date  02/08/20            Plan - 11/23/19 1022    Clinical Impression Statement  Pt demonstrates elevated tone 5-16mV intially.  She is able to relax down to 2-61mV after breathing and stretches. Pt fatigues quickly doing kegel.  She has a hard time feeling  that she is engaging the pelvic floor  Pt will benefit from PT to continue to work on strength and endurance.    PT Treatment/Interventions  ADLs/Self Care Home Management;Biofeedback;Electrical Stimulation;Cryotherapy;Moist Heat;Functional mobility training;Therapeutic activities;Therapeutic exercise;Patient/family education;Neuromuscular re-education;Taping;Dry needling;Manual techniques    PT Next Visit Plan  biofeedback, internal STM for scar tissue,  basic pelvic and core strength    PT Home Exercise Plan  Access Code: [email or web address]    Consulted and Agree with Plan of Care  Patient       Patient will benefit from skilled therapeutic intervention in order to improve the following deficits and impairments:  Increased muscle spasms, Impaired tone, Decreased range of motion, Decreased strength, Increased fascial restricitons, Impaired flexibility, Postural dysfunction, Pain  Visit Diagnosis: Muscle weakness (generalized)  Unspecified lack of coordination     Problem List Patient Active Problem List   Diagnosis Date Noted  . Anal cancer ([place]) 06/13/2019  . Hematuria, gross 03/11/2017  . Status post total shoulder arthroplasty, right 09/12/2016  . Arthritis of knee 02/27/2015  . Primary osteoarthritis of right knee Valgus 02/24/2015  . Neuropathy 09/07/2013  . Breast cancer of upper-outer quadrant of left female breast ([place]) 08/09/2013  . Breast cancer of upper-outer quadrant of right female breast ([place]) 08/09/2013  . Osteoarthritis of right hip 05/03/2013  . Osteoarthritis of left hip 02/15/2013    Jule Ser, PT 11/23/2019, 10:30 AM  [facility] [address], [place], [place], [address] Phone: [phone]   Fax:  [phone]  Name: Cynthia Velasquez MRN: [id number] Date of Birth: 11/11/1950

## 2019-11-23 NOTE — Patient Instructions (Signed)
Access Code: [email or web address] URL: [email or web address] Date: 11/23/2019 Prepared by: Jari Favre  Exercises Seated Hamstring Stretch - 10 reps - 3 sets - 1x daily - 7x weekly Seated Piriformis Stretch - 3 reps - 1 sets - 30 sec hold - 1x daily - 7x weekly Supine Figure 4 Piriformis Stretch - 3 reps - 1 sets - 30 sec hold - 1x daily - 7x weekly Supine Diaphragmatic Breathing with Pelvic Floor Lengthening - 10 reps - 1 sets - 3x daily - 7x weekly Supine Butterfly Groin Stretch - 3 reps - 1 sets - 30 sec hold - 1x daily - 7x weekly Supine Pelvic Floor Contraction - 10 reps - 1 sets - 2 sec hold - 3x daily - 7x weekly Hooklying Small March - 10 reps - 2 sets - 1x daily - 7x weekly Seated Lumbar Flexion Stretch - 10 reps - 1 sets - 10 sec hold - 1x daily - 7x weekly Supine Pelvic Floor Contraction - 15 reps - 1 sets - 3 sec hold - 3x daily - 7x weekly

## 2019-11-24 ENCOUNTER — Telehealth: Payer: Self-pay | Admitting: Emergency Medicine

## 2019-11-24 ENCOUNTER — Ambulatory Visit (HOSPITAL_COMMUNITY): Payer: PPO | Attending: Cardiovascular Disease

## 2019-11-24 ENCOUNTER — Other Ambulatory Visit: Payer: Self-pay

## 2019-11-24 DIAGNOSIS — I517 Cardiomegaly: Secondary | ICD-10-CM | POA: Diagnosis not present

## 2019-11-24 NOTE — Telephone Encounter (Signed)
Received VM from [facility] w/[employer] Leave [employer] Disability team requesting office notes from pt's last visit on 11/15/19, as well as an updated treatment and work status.  Printed office note with NP Cira Rue from 11/02/19 and PT plan of care certification/recertification from [id number].  Faxed information to number provided.  Fax received.

## 2019-11-30 ENCOUNTER — Encounter: Payer: Self-pay | Admitting: Physical Therapy

## 2019-11-30 ENCOUNTER — Other Ambulatory Visit: Payer: Self-pay

## 2019-11-30 ENCOUNTER — Ambulatory Visit: Payer: 59 | Admitting: Physical Therapy

## 2019-11-30 DIAGNOSIS — M6281 Muscle weakness (generalized): Secondary | ICD-10-CM | POA: Diagnosis not present

## 2019-11-30 DIAGNOSIS — R279 Unspecified lack of coordination: Secondary | ICD-10-CM

## 2019-11-30 NOTE — Patient Instructions (Signed)
Access Code: [email or web address] URL: [email or web address] Date: 11/30/2019 Prepared by: Jari Favre  Exercises Seated Hamstring Stretch - 1 x daily - 7 x weekly - 10 reps - 3 sets Seated Piriformis Stretch - 1 x daily - 7 x weekly - 3 reps - 1 sets - 30 sec hold Supine Figure 4 Piriformis Stretch - 1 x daily - 7 x weekly - 3 reps - 1 sets - 30 sec hold Supine Diaphragmatic Breathing with Pelvic Floor Lengthening - 3 x daily - 7 x weekly - 10 reps - 1 sets Supine Butterfly Groin Stretch - 1 x daily - 7 x weekly - 3 reps - 1 sets - 30 sec hold Supine Pelvic Floor Contraction - 3 x daily - 7 x weekly - 10 reps - 1 sets - 2 sec hold Hooklying Small March - 1 x daily - 7 x weekly - 10 reps - 2 sets Seated Lumbar Flexion Stretch - 1 x daily - 7 x weekly - 10 reps - 1 sets - 10 sec hold Supine Pelvic Floor Contraction - 3 x daily - 7 x weekly - 15 reps - 1 sets - 3 sec hold Seated Pelvic Floor Contraction with Isometric Hip Adduction - 3 x daily - 7 x weekly - 1 sets - 15 reps - 3 sechold, 3 sec rest hold

## 2019-11-30 NOTE — Therapy (Signed)
[facility] [address], [place], [place], [address] Phone: [phone]   Fax:  [phone]  Physical Therapy Treatment Progress Note Reporting Period 09/07/19 to 11/30/19   See note below for Objective Data and Assessment of Progress/Goals.      Patient Details  Name: Cynthia Velasquez MRN: [id number] Date of Birth: Oct 25, 1950 Referring Provider (PT): Truitt Merle, MD   Encounter Date: 11/30/2019  PT End of Session - 11/30/19 1301    Visit Number  10    Date for PT Re-Evaluation  12/28/19    Authorization Type  medicare    PT Start Time  0934    PT Stop Time  1014    PT Time Calculation (min)  40 min    Activity Tolerance  Patient tolerated treatment well;Patient limited by pain    Behavior During Therapy  [facility] for tasks assessed/performed       Past Medical History:  Diagnosis Date  . Anxiety   . Arthritis   . Bilateral breast cancer ([place]) 10/21/2013  . Breast cancer ([place])   . Cancer ([place])   . Depression   . Gallstones   . GERD (gastroesophageal reflux disease)    doesn't take any meds for this  . Headache    h/o migraines      . History of bladder infections   . History of hiatal hernia   . History of migraine   . Hyperlipidemia    takes Simvastatin daily  . Hypertension    takes Hyzaar  . Joint pain   . Joint swelling   . Lumbar stenosis   . Neuropathy 09/07/2013  . Pneumonia   . Pre-diabetes     Past Surgical History:  Procedure Laterality Date  . ABDOMINAL HYSTERECTOMY    . bone spur removed from left foot    . CHOLECYSTECTOMY    . COLONOSCOPY    . double mastectomy     . HERNIA REPAIR     umbilical  . right knee arthroscopy    . right shoulder arthroscopy    . surgery for hiatal hernia    . TONSILLECTOMY    . TOTAL HIP ARTHROPLASTY Left 02/12/2013  . TOTAL HIP ARTHROPLASTY Left 02/12/2013   Procedure: LEFT TOTAL HIP ARTHROPLASTY;  Surgeon: Kerin Salen, MD;  Location: [facility];   Service: Orthopedics;  Laterality: Left;  . TOTAL HIP ARTHROPLASTY Right 05/03/2013   Procedure: TOTAL HIP ARTHROPLASTY;  Surgeon: Kerin Salen, MD;  Location: [facility];  Service: Orthopedics;  Laterality: Right;  . TOTAL KNEE ARTHROPLASTY Right 02/27/2015   Procedure: TOTAL KNEE ARTHROPLASTY;  Surgeon: Frederik Pear, MD;  Location: [facility];  Service: Orthopedics;  Laterality: Right;  . TOTAL SHOULDER ARTHROPLASTY Right 09/12/2016   Procedure: RIGHT TOTAL SHOULDER ARTHROPLASTY;  Surgeon: Tania Ade, MD;  Location: [facility];  Service: Orthopedics;  Laterality: Right;  RIGHT TOTAL SHOULDER ARTHROPLASTY    There were no vitals filed for this visit.  Subjective Assessment - 11/30/19 0937    Subjective  I have been having low back pain and states it gets to 10/10 with palpation; I have an abscess that is on the lower labia minora inferior to the indroitus.    Patient Stated Goals  leakage, energy levels, being able to sleep at night (cannot sleep out of fear)    Currently in Pain?  No/denies  [facility] Adult PT Treatment/Exercise - 11/30/19 0001      Lumbar Exercises: Standing   Other Standing Lumbar Exercises  pelvic tilt against the wall - 10x 3 sec    Other Standing Lumbar Exercises  wall slide with ball squeeze - 10x 3 sec      Lumbar Exercises: Seated   Other Seated Lumbar Exercises  kegel withLAQ, hip flex march (2lb); ball squeeze - 15 x 3 sec hold             PT Education - 11/30/19 1012    Education Details  Access Code: [email or web address]    Person(s) Educated  Patient    Methods  Explanation;Demonstration;Verbal cues;Handout    Comprehension  Verbalized understanding;Returned demonstration       PT Short Term Goals - 11/16/19 1035      PT SHORT TERM GOAL #2   Title  pt will report 30% less leakage    Status  Achieved        PT Long Term Goals - 11/16/19 [id number]      PT LONG TERM GOAL #1   Title  Pt will be able to reduce leakage and use only  1-2 pads/day    Baseline  same amount of pads    Time  12    Period  Weeks    Status  On-going    Target Date  02/08/20      PT LONG TERM GOAL #2   Title  Pt will report no nocturia    Baseline  leakage at night, but not a lot and I can't tell that I have to go    Time  12    Period  Weeks    Status  On-going    Target Date  02/08/20      PT LONG TERM GOAL #3   Title  Pt will report she can feel the urge to urinate at least 90% of the time due to improved health of pelvic floor muscles    Time  12    Period  Weeks    Status  On-going    Target Date  02/08/20      PT LONG TERM GOAL #4   Title  Pt will report at least 60% less leakage due to improved pelvic strength    Baseline  90% better (then states that she is talking about when she can feel the urge)    Time  12    Period  Weeks    Status  On-going    Target Date  02/08/20      PT LONG TERM GOAL #5   Title  Pt will be able to exercise consistently for at least 20 minutes 4-5 days/week due to improved function and energy levels    Baseline  falls asleep throughout the day and feels no energy    Time  12    Period  Weeks    Status  New    Target Date  02/08/20            Plan - 11/30/19 1017    Clinical Impression Statement  Pt still having back pain and very sore from absess so she declined biofeedback or soft tissue work. Todays session focused on core and pelvic floor strengthening in seated and standing.  She was closely monitored for pain througout.  Pt was able to progress to wall slides with pelvic tilt.  Pt needs cues to keep back straight and not shift weight to the  Rt side. pt will benefit from skilled PT to continue to work on core strength for reduced leakage.    Comorbidities  chemo, radiation, hx of breast cancer    PT Treatment/Interventions  ADLs/Self Care Home Management;Biofeedback;Electrical Stimulation;Cryotherapy;Moist Heat;Functional mobility training;Therapeutic activities;Therapeutic  exercise;Patient/family education;Neuromuscular re-education;Taping;Dry needling;Manual techniques    PT Next Visit Plan  biofeedback, internal STM for scar tissue, progress pelvic and core strength standing staggered and small lunges    PT Home Exercise Plan  Access Code: [email or web address]    Consulted and Agree with Plan of Care  Patient       Patient will benefit from skilled therapeutic intervention in order to improve the following deficits and impairments:  Increased muscle spasms, Impaired tone, Decreased range of motion, Decreased strength, Increased fascial restricitons, Impaired flexibility, Postural dysfunction, Pain  Visit Diagnosis: Muscle weakness (generalized)  Unspecified lack of coordination     Problem List Patient Active Problem List   Diagnosis Date Noted  . Anal cancer ([place]) 06/13/2019  . Hematuria, gross 03/11/2017  . Status post total shoulder arthroplasty, right 09/12/2016  . Arthritis of knee 02/27/2015  . Primary osteoarthritis of right knee Valgus 02/24/2015  . Neuropathy 09/07/2013  . Breast cancer of upper-outer quadrant of left female breast ([place]) 08/09/2013  . Breast cancer of upper-outer quadrant of right female breast ([place]) 08/09/2013  . Osteoarthritis of right hip 05/03/2013  . Osteoarthritis of left hip 02/15/2013    Jule Ser, PT 11/30/2019, 1:06 PM  [facility] [address] [place], [place], [address] Phone: [phone]   Fax:  [phone]  Name: Cynthia Velasquez MRN: [id number] Date of Birth: Sep 13, 1951

## 2019-12-02 DIAGNOSIS — N764 Abscess of vulva: Secondary | ICD-10-CM | POA: Diagnosis not present

## 2019-12-07 ENCOUNTER — Ambulatory Visit: Payer: 59 | Admitting: Physical Therapy

## 2019-12-07 ENCOUNTER — Other Ambulatory Visit: Payer: Self-pay

## 2019-12-07 DIAGNOSIS — R279 Unspecified lack of coordination: Secondary | ICD-10-CM

## 2019-12-07 DIAGNOSIS — M6281 Muscle weakness (generalized): Secondary | ICD-10-CM

## 2019-12-07 NOTE — Therapy (Signed)
[facility] [address] [place], [place], [address] Phone: [phone]   Fax:  [phone]  Physical Therapy Treatment  Patient Details  Name: Cynthia Velasquez MRN: [id number] Date of Birth: 1951/02/19 Referring Provider (PT): Truitt Merle, MD   Encounter Date: 12/07/2019  PT End of Session - 12/07/19 0936    Visit Number  11    Date for PT Re-Evaluation  12/28/19    Authorization Type  medicare    PT Start Time  0932    PT Stop Time  1013    PT Time Calculation (min)  41 min    Activity Tolerance  Patient tolerated treatment well;Patient limited by pain    Behavior During Therapy  [facility] for tasks assessed/performed       Past Medical History:  Diagnosis Date  . Anxiety   . Arthritis   . Bilateral breast cancer ([place]) 10/21/2013  . Breast cancer ([place])   . Cancer ([place])   . Depression   . Gallstones   . GERD (gastroesophageal reflux disease)    doesn't take any meds for this  . Headache    h/o migraines      . History of bladder infections   . History of hiatal hernia   . History of migraine   . Hyperlipidemia    takes Simvastatin daily  . Hypertension    takes Hyzaar  . Joint pain   . Joint swelling   . Lumbar stenosis   . Neuropathy 09/07/2013  . Pneumonia   . Pre-diabetes     Past Surgical History:  Procedure Laterality Date  . ABDOMINAL HYSTERECTOMY    . bone spur removed from left foot    . CHOLECYSTECTOMY    . COLONOSCOPY    . double mastectomy     . HERNIA REPAIR     umbilical  . right knee arthroscopy    . right shoulder arthroscopy    . surgery for hiatal hernia    . TONSILLECTOMY    . TOTAL HIP ARTHROPLASTY Left 02/12/2013  . TOTAL HIP ARTHROPLASTY Left 02/12/2013   Procedure: LEFT TOTAL HIP ARTHROPLASTY;  Surgeon: Kerin Salen, MD;  Location: [place];  Service: Orthopedics;  Laterality: Left;  . TOTAL HIP ARTHROPLASTY Right 05/03/2013   Procedure: TOTAL HIP ARTHROPLASTY;  Surgeon: Kerin Salen, MD;  Location: [place];  Service: Orthopedics;  Laterality: Right;  . TOTAL KNEE ARTHROPLASTY Right 02/27/2015   Procedure: TOTAL KNEE ARTHROPLASTY;  Surgeon: Frederik Pear, MD;  Location: [place];  Service: Orthopedics;  Laterality: Right;  . TOTAL SHOULDER ARTHROPLASTY Right 09/12/2016   Procedure: RIGHT TOTAL SHOULDER ARTHROPLASTY;  Surgeon: Tania Ade, MD;  Location: [place];  Service: Orthopedics;  Laterality: Right;  RIGHT TOTAL SHOULDER ARTHROPLASTY    There were no vitals filed for this visit.  Subjective Assessment - 12/07/19 1000    Subjective  I have had very bad pain since the cyst was drained    Currently in Pain?  Yes    Pain Score  8     Pain Location  Back    Pain Descriptors / Indicators  Aching    Pain Type  Chronic pain    Pain Radiating Towards  back and around to the groin    Multiple Pain Sites  No                       [facility] Adult PT Treatment/Exercise - 12/07/19 0001  Lumbar Exercises: Prone   Other Prone Lumbar Exercises  kegel in prone 5 x 3 sec hold; kegel with small press up - 10x 3 sec   cues to relax glutes and stay in pain free ROM     Manual Therapy   Myofascial Release  thoracolumbar fascial release in all planes; abdoinal fascial release in all planes around colon               PT Short Term Goals - 11/16/19 1035      PT SHORT TERM GOAL #2   Title  pt will report 30% less leakage    Status  Achieved        PT Long Term Goals - 11/16/19 [id number]      PT LONG TERM GOAL #1   Title  Pt will be able to reduce leakage and use only 1-2 pads/day    Baseline  same amount of pads    Time  12    Period  Weeks    Status  On-going    Target Date  02/08/20      PT LONG TERM GOAL #2   Title  Pt will report no nocturia    Baseline  leakage at night, but not a lot and I can't tell that I have to go    Time  12    Period  Weeks    Status  On-going    Target Date  02/08/20      PT LONG TERM GOAL #3   Title  Pt will  report she can feel the urge to urinate at least 90% of the time due to improved health of pelvic floor muscles    Time  12    Period  Weeks    Status  On-going    Target Date  02/08/20      PT LONG TERM GOAL #4   Title  Pt will report at least 60% less leakage due to improved pelvic strength    Baseline  90% better (then states that she is talking about when she can feel the urge)    Time  12    Period  Weeks    Status  On-going    Target Date  02/08/20      PT LONG TERM GOAL #5   Title  Pt will be able to exercise consistently for at least 20 minutes 4-5 days/week due to improved function and energy levels    Baseline  falls asleep throughout the day and feels no energy    Time  12    Period  Weeks    Status  New    Target Date  02/08/20            Plan - 12/07/19 1102    Clinical Impression Statement  Pt presented with increased back and groin pain today.  Today's session focused on gentle myofascial release and she was then able to perform some gentle kegel exercises in prone.  She needed cues to prevent using glute max muscle.  She was able to add small press up in pain free range.  Pt will continue to benefit from skilled to progress pelvic floor strength and endurance to improve bowel control.    PT Treatment/Interventions  ADLs/Self Care Home Management;Biofeedback;Electrical Stimulation;Cryotherapy;Moist Heat;Functional mobility training;Therapeutic activities;Therapeutic exercise;Patient/family education;Neuromuscular re-education;Taping;Dry needling;Manual techniques    PT Next Visit Plan  f/u on back pain; biofeedback, internal STM for scar tissue, progress pelvic and core strength standing staggered and  small lunges    PT Home Exercise Plan  Access Code: [email or web address] and Agree with Plan of Care  Patient       Patient will benefit from skilled therapeutic intervention in order to improve the following deficits and impairments:  Increased muscle spasms,  Impaired tone, Decreased range of motion, Decreased strength, Increased fascial restricitons, Impaired flexibility, Postural dysfunction, Pain  Visit Diagnosis: Muscle weakness (generalized)  Unspecified lack of coordination     Problem List Patient Active Problem List   Diagnosis Date Noted  . Anal cancer ([place]) 06/13/2019  . Hematuria, gross 03/11/2017  . Status post total shoulder arthroplasty, right 09/12/2016  . Arthritis of knee 02/27/2015  . Primary osteoarthritis of right knee Valgus 02/24/2015  . Neuropathy 09/07/2013  . Breast cancer of upper-outer quadrant of left female breast ([place]) 08/09/2013  . Breast cancer of upper-outer quadrant of right female breast ([place]) 08/09/2013  . Osteoarthritis of right hip 05/03/2013  . Osteoarthritis of left hip 02/15/2013    Jule Ser, PT 12/07/2019, 11:12 AM  [facility] [address], [place], [place], [address] Phone: [phone]   Fax:  [phone]  Name: Smt Stidam MRN: [id number] Date of Birth: February 23, 1951

## 2019-12-08 ENCOUNTER — Encounter: Payer: Self-pay | Admitting: Nurse Practitioner

## 2019-12-14 ENCOUNTER — Other Ambulatory Visit: Payer: Self-pay

## 2019-12-14 ENCOUNTER — Ambulatory Visit: Payer: 59 | Admitting: Physical Therapy

## 2019-12-14 ENCOUNTER — Encounter: Payer: Self-pay | Admitting: Physical Therapy

## 2019-12-14 DIAGNOSIS — M6281 Muscle weakness (generalized): Secondary | ICD-10-CM | POA: Diagnosis not present

## 2019-12-14 DIAGNOSIS — R279 Unspecified lack of coordination: Secondary | ICD-10-CM

## 2019-12-14 NOTE — Therapy (Signed)
[facility] [address], [place], [place], [address] Phone: [phone]   Fax:  [phone]  Physical Therapy Treatment  Patient Details  Name: Cynthia Velasquez MRN: [id number] Date of Birth: 02-04-1951 Referring Provider (PT): Truitt Merle, MD   Encounter Date: 12/14/2019  PT End of Session - 12/14/19 0933    Visit Number  12    Date for PT Re-Evaluation  12/28/19    Authorization Type  medicare    PT Start Time  0933    PT Stop Time  1013    PT Time Calculation (min)  40 min    Activity Tolerance  Patient tolerated treatment well;Patient limited by pain    Behavior During Therapy  [facility] for tasks assessed/performed       Past Medical History:  Diagnosis Date  . Anxiety   . Arthritis   . Bilateral breast cancer ([place]) 10/21/2013  . Breast cancer ([place])   . Cancer ([place])   . Depression   . Gallstones   . GERD (gastroesophageal reflux disease)    doesn't take any meds for this  . Headache    h/o migraines      . History of bladder infections   . History of hiatal hernia   . History of migraine   . Hyperlipidemia    takes Simvastatin daily  . Hypertension    takes Hyzaar  . Joint pain   . Joint swelling   . Lumbar stenosis   . Neuropathy 09/07/2013  . Pneumonia   . Pre-diabetes     Past Surgical History:  Procedure Laterality Date  . ABDOMINAL HYSTERECTOMY    . bone spur removed from left foot    . CHOLECYSTECTOMY    . COLONOSCOPY    . double mastectomy     . HERNIA REPAIR     umbilical  . right knee arthroscopy    . right shoulder arthroscopy    . surgery for hiatal hernia    . TONSILLECTOMY    . TOTAL HIP ARTHROPLASTY Left 02/12/2013  . TOTAL HIP ARTHROPLASTY Left 02/12/2013   Procedure: LEFT TOTAL HIP ARTHROPLASTY;  Surgeon: Kerin Salen, MD;  Location: [place];  Service: Orthopedics;  Laterality: Left;  . TOTAL HIP ARTHROPLASTY Right 05/03/2013   Procedure: TOTAL HIP ARTHROPLASTY;  Surgeon: Kerin Salen, MD;  Location: [place];  Service: Orthopedics;  Laterality: Right;  . TOTAL KNEE ARTHROPLASTY Right 02/27/2015   Procedure: TOTAL KNEE ARTHROPLASTY;  Surgeon: Frederik Pear, MD;  Location: [place];  Service: Orthopedics;  Laterality: Right;  . TOTAL SHOULDER ARTHROPLASTY Right 09/12/2016   Procedure: RIGHT TOTAL SHOULDER ARTHROPLASTY;  Surgeon: Tania Ade, MD;  Location: [place];  Service: Orthopedics;  Laterality: Right;  RIGHT TOTAL SHOULDER ARTHROPLASTY    There were no vitals filed for this visit.  Subjective Assessment - 12/14/19 0934    Subjective  Back is sore.  I had a bad BM day yesterday.  I didn't know if I was passing gas or having a BM    Patient Stated Goals  leakage, energy levels, being able to sleep at night (cannot sleep out of fear)    Currently in Pain?  Yes    Pain Score  7     Pain Location  Back    Pain Orientation  Lower    Pain Descriptors / Indicators  Aching    Multiple Pain Sites  No  [place] Adult PT Treatment/Exercise - 12/14/19 0001      Self-Care   Other Self-Care Comments   reviewed lumbar stretch and breathing      Neuro Re-ed    Neuro Re-ed Details   kegel able to contract and hold 4 sec with palpation      Lumbar Exercises: Aerobic   Nustep  L2 x 6 min      Lumbar Exercises: Standing   Other Standing Lumbar Exercises  staggared stance with kegel - weighted ball raises 5x each way      Manual Therapy   Manual therapy comments  pt identity confirmed and informed consent given to perform soft tissue internally    Internal Pelvic Floor  external to ischiocavernosis and MFR around perineum; very TTP with internal palpation of Rt OI and TP - applied gentle fascial release to Lt and Rt side from 12 to 6 on clock; able to press in for stretch on Lt side but Rt side still TTP               PT Short Term Goals - 11/16/19 1035      PT SHORT TERM GOAL #2   Title  pt will report 30% less leakage    Status   Achieved        PT Long Term Goals - 11/16/19 [id number]      PT LONG TERM GOAL #1   Title  Pt will be able to reduce leakage and use only 1-2 pads/day    Baseline  same amount of pads    Time  12    Period  Weeks    Status  On-going    Target Date  02/08/20      PT LONG TERM GOAL #2   Title  Pt will report no nocturia    Baseline  leakage at night, but not a lot and I can't tell that I have to go    Time  12    Period  Weeks    Status  On-going    Target Date  02/08/20      PT LONG TERM GOAL #3   Title  Pt will report she can feel the urge to urinate at least 90% of the time due to improved health of pelvic floor muscles    Time  12    Period  Weeks    Status  On-going    Target Date  02/08/20      PT LONG TERM GOAL #4   Title  Pt will report at least 60% less leakage due to improved pelvic strength    Baseline  90% better (then states that she is talking about when she can feel the urge)    Time  12    Period  Weeks    Status  On-going    Target Date  02/08/20      PT LONG TERM GOAL #5   Title  Pt will be able to exercise consistently for at least 20 minutes 4-5 days/week due to improved function and energy levels    Baseline  falls asleep throughout the day and feels no energy    Time  12    Period  Weeks    Status  New    Target Date  02/08/20            Plan - 12/14/19 1019    Clinical Impression Statement  Pt felt better after exercises and STM today. Pt is very  TTP and has a hard time with pelvic floor contraciton >5 sec.  Pt felt good on nustep and no increased pain.  Pt will contineu to benefit from skilled PT to work on increased strength and endurance.    PT Treatment/Interventions  ADLs/Self Care Home Management;Biofeedback;Electrical Stimulation;Cryotherapy;Moist Heat;Functional mobility training;Therapeutic activities;Therapeutic exercise;Patient/family education;Neuromuscular re-education;Taping;Dry needling;Manual techniques    PT Home Exercise Plan   Access Code: [email or web address] and Agree with Plan of Care  Patient       Patient will benefit from skilled therapeutic intervention in order to improve the following deficits and impairments:  Increased muscle spasms, Impaired tone, Decreased range of motion, Decreased strength, Increased fascial restricitons, Impaired flexibility, Postural dysfunction, Pain  Visit Diagnosis: Muscle weakness (generalized)  Unspecified lack of coordination     Problem List Patient Active Problem List   Diagnosis Date Noted  . Anal cancer ([place]) 06/13/2019  . Hematuria, gross 03/11/2017  . Status post total shoulder arthroplasty, right 09/12/2016  . Arthritis of knee 02/27/2015  . Primary osteoarthritis of right knee Valgus 02/24/2015  . Neuropathy 09/07/2013  . Breast cancer of upper-outer quadrant of left female breast ([place]) 08/09/2013  . Breast cancer of upper-outer quadrant of right female breast ([place]) 08/09/2013  . Osteoarthritis of right hip 05/03/2013  . Osteoarthritis of left hip 02/15/2013    Jule Ser, PT 12/14/2019, 10:50 AM  [facility] [address], [place], [place], [address] Phone: [phone]   Fax:  [phone]  Name: Cynthia Velasquez MRN: [id number] Date of Birth: [birth year]

## 2019-12-16 ENCOUNTER — Other Ambulatory Visit: Payer: Self-pay

## 2019-12-16 ENCOUNTER — Ambulatory Visit: Payer: 59 | Attending: Hematology | Admitting: Physical Therapy

## 2019-12-16 ENCOUNTER — Encounter: Payer: Self-pay | Admitting: Physical Therapy

## 2019-12-16 DIAGNOSIS — R279 Unspecified lack of coordination: Secondary | ICD-10-CM | POA: Diagnosis not present

## 2019-12-16 DIAGNOSIS — M6281 Muscle weakness (generalized): Secondary | ICD-10-CM | POA: Diagnosis not present

## 2019-12-16 NOTE — Patient Instructions (Signed)
Access Code: [email or web address] URL: [email or web address] Date: 12/16/2019 Prepared by: Jari Favre  Exercises Seated Hamstring Stretch - 1 x daily - 7 x weekly - 10 reps - 3 sets Seated Piriformis Stretch - 1 x daily - 7 x weekly - 3 reps - 1 sets - 30 sec hold Supine Figure 4 Piriformis Stretch - 1 x daily - 7 x weekly - 3 reps - 1 sets - 30 sec hold Supine Diaphragmatic Breathing with Pelvic Floor Lengthening - 3 x daily - 7 x weekly - 10 reps - 1 sets Supine Butterfly Groin Stretch - 1 x daily - 7 x weekly - 3 reps - 1 sets - 30 sec hold Supine Pelvic Floor Contraction - 3 x daily - 7 x weekly - 10 reps - 1 sets - 2 sec hold Hooklying Small March - 1 x daily - 7 x weekly - 10 reps - 2 sets Seated Lumbar Flexion Stretch - 1 x daily - 7 x weekly - 10 reps - 1 sets - 10 sec hold Supine Pelvic Floor Contraction - 3 x daily - 7 x weekly - 15 reps - 1 sets - 3 sec hold Seated Pelvic Floor Contraction with Isometric Hip Adduction - 3 x daily - 7 x weekly - 1 sets - 15 reps - 3 sechold, 3 sec rest hold Mini Squat with Pelvic Floor Contraction - 1 x daily - 7 x weekly - 10 reps - 3 sets Step Up with Pelvic Floor Contraction - 1 x daily - 7 x weekly - 10 reps - 3 sets Sidestepping with Pelvic Floor Contraction - 1 x daily - 7 x weekly - 10 reps - 3 sets

## 2019-12-16 NOTE — Therapy (Signed)
[facility] [address], [place], [place], [address] Phone: [phone]   Fax:  [phone]  Physical Therapy Treatment  Patient Details  Name: Cynthia Velasquez MRN: [id number] Date of Birth: January 14, 1951 Referring Provider (PT): Truitt Merle, MD   Encounter Date: 12/16/2019  PT End of Session - 12/16/19 1102    Visit Number  13    Date for PT Re-Evaluation  12/28/19    Authorization Type  medicare    PT Start Time  1014    PT Stop Time  1100    PT Time Calculation (min)  46 min    Activity Tolerance  Patient tolerated treatment well    Behavior During Therapy  [facility] for tasks assessed/performed       Past Medical History:  Diagnosis Date  . Anxiety   . Arthritis   . Bilateral breast cancer ([place]) 10/21/2013  . Breast cancer ([place])   . Cancer ([place])   . Depression   . Gallstones   . GERD (gastroesophageal reflux disease)    doesn't take any meds for this  . Headache    h/o migraines      . History of bladder infections   . History of hiatal hernia   . History of migraine   . Hyperlipidemia    takes Simvastatin daily  . Hypertension    takes Hyzaar  . Joint pain   . Joint swelling   . Lumbar stenosis   . Neuropathy 09/07/2013  . Pneumonia   . Pre-diabetes     Past Surgical History:  Procedure Laterality Date  . ABDOMINAL HYSTERECTOMY    . bone spur removed from left foot    . CHOLECYSTECTOMY    . COLONOSCOPY    . double mastectomy     . HERNIA REPAIR     umbilical  . right knee arthroscopy    . right shoulder arthroscopy    . surgery for hiatal hernia    . TONSILLECTOMY    . TOTAL HIP ARTHROPLASTY Left 02/12/2013  . TOTAL HIP ARTHROPLASTY Left 02/12/2013   Procedure: LEFT TOTAL HIP ARTHROPLASTY;  Surgeon: Kerin Salen, MD;  Location: [place];  Service: Orthopedics;  Laterality: Left;  . TOTAL HIP ARTHROPLASTY Right 05/03/2013   Procedure: TOTAL HIP ARTHROPLASTY;  Surgeon: Kerin Salen, MD;  Location: [place];  Service: Orthopedics;  Laterality: Right;  . TOTAL KNEE ARTHROPLASTY Right 02/27/2015   Procedure: TOTAL KNEE ARTHROPLASTY;  Surgeon: Frederik Pear, MD;  Location: [place];  Service: Orthopedics;  Laterality: Right;  . TOTAL SHOULDER ARTHROPLASTY Right 09/12/2016   Procedure: RIGHT TOTAL SHOULDER ARTHROPLASTY;  Surgeon: Tania Ade, MD;  Location: [place];  Service: Orthopedics;  Laterality: Right;  RIGHT TOTAL SHOULDER ARTHROPLASTY    There were no vitals filed for this visit.  Subjective Assessment - 12/16/19 1017    Subjective  Pt states she is having bowel issues and leakage this morning. I had pain getting up out of the car like a pinch    Patient Stated Goals  leakage, energy levels, being able to sleep at night    Currently in Pain?  No/denies                       [facility] Adult PT Treatment/Exercise - 12/16/19 0001      Lumbar Exercises: Aerobic   Nustep  L3 x 8 min      Lumbar Exercises: Standing   Row  Strengthening;Right;Left;20  reps;Theraband    Theraband Level (Row)  Level 2 (Red)    Shoulder Extension  Strengthening;Right;Left;20 reps;Theraband    Theraband Level (Shoulder Extension)  Level 2 (Red)    Other Standing Lumbar Exercises  steps, mini squat with yellow band; side steps with yellow band - 2x10 each with kegel      Lumbar Exercises: Seated   Other Seated Lumbar Exercises  lumbar flexion - 10x ball roll             PT Education - 12/16/19 1049    Education Details  Access Code: [email or web address]    Person(s) Educated  Patient    Methods  Explanation;Demonstration;Tactile cues;Verbal cues;Handout    Comprehension  Verbalized understanding;Returned demonstration       PT Short Term Goals - 11/16/19 1035      PT SHORT TERM GOAL #2   Title  pt will report 30% less leakage    Status  Achieved        PT Long Term Goals - 11/16/19 [id number]      PT LONG TERM GOAL #1   Title  Pt will be able to reduce leakage and use only 1-2 pads/day     Baseline  same amount of pads    Time  12    Period  Weeks    Status  On-going    Target Date  02/08/20      PT LONG TERM GOAL #2   Title  Pt will report no nocturia    Baseline  leakage at night, but not a lot and I can't tell that I have to go    Time  12    Period  Weeks    Status  On-going    Target Date  02/08/20      PT LONG TERM GOAL #3   Title  Pt will report she can feel the urge to urinate at least 90% of the time due to improved health of pelvic floor muscles    Time  12    Period  Weeks    Status  On-going    Target Date  02/08/20      PT LONG TERM GOAL #4   Title  Pt will report at least 60% less leakage due to improved pelvic strength    Baseline  90% better (then states that she is talking about when she can feel the urge)    Time  12    Period  Weeks    Status  On-going    Target Date  02/08/20      PT LONG TERM GOAL #5   Title  Pt will be able to exercise consistently for at least 20 minutes 4-5 days/week due to improved function and energy levels    Baseline  falls asleep throughout the day and feels no energy    Time  12    Period  Weeks    Status  New    Target Date  02/08/20            Plan - 12/16/19 1114    Clinical Impression Statement  Pt did well with progression of strength today. She needs cues to engage her core and pelvic floor.  Pt has stiff trunk and needed VC and TC to get rotation of ribcage and use obliques.  Pt was monitored for pain thrhoughout and no increased pain. Continue with strength and mobility for rerurn to max function.    Comorbidities  chemo, radiation, hx of  breast cancer    PT Treatment/Interventions  ADLs/Self Care Home Management;Biofeedback;Electrical Stimulation;Cryotherapy;Moist Heat;Functional mobility training;Therapeutic activities;Therapeutic exercise;Patient/family education;Neuromuscular re-education;Taping;Dry needling;Manual techniques    PT Next Visit Plan  f/u on back pain; biofeedback, internal STM for  scar tissue, progress pelvic and core strength standing staggered and small lunges    PT Home Exercise Plan  Access Code: [email or web address]    Consulted and Agree with Plan of Care  Patient       Patient will benefit from skilled therapeutic intervention in order to improve the following deficits and impairments:  Increased muscle spasms, Impaired tone, Decreased range of motion, Decreased strength, Increased fascial restricitons, Impaired flexibility, Postural dysfunction, Pain  Visit Diagnosis: Muscle weakness (generalized)  Unspecified lack of coordination     Problem List Patient Active Problem List   Diagnosis Date Noted  . Anal cancer ([place]) 06/13/2019  . Hematuria, gross 03/11/2017  . Status post total shoulder arthroplasty, right 09/12/2016  . Arthritis of knee 02/27/2015  . Primary osteoarthritis of right knee Valgus 02/24/2015  . Neuropathy 09/07/2013  . Breast cancer of upper-outer quadrant of left female breast ([place]) 08/09/2013  . Breast cancer of upper-outer quadrant of right female breast ([place]) 08/09/2013  . Osteoarthritis of right hip 05/03/2013  . Osteoarthritis of left hip 02/15/2013    Jule Ser, PT 12/16/2019, 11:23 AM  [facility] [address], [place], [place], [address] Phone: [phone]   Fax:  [phone]  Name: Cynthia Velasquez MRN: [id number] Date of Birth: 26-Sep-1950

## 2019-12-17 ENCOUNTER — Telehealth: Payer: Self-pay | Admitting: Nurse Practitioner

## 2019-12-17 NOTE — Telephone Encounter (Signed)
Scheduled appt per 4/2 sch msg - pt aware of appt date and time   

## 2019-12-21 ENCOUNTER — Ambulatory Visit: Payer: 59 | Admitting: Physical Therapy

## 2019-12-21 ENCOUNTER — Encounter: Payer: Self-pay | Admitting: Physical Therapy

## 2019-12-21 ENCOUNTER — Other Ambulatory Visit: Payer: Self-pay

## 2019-12-21 DIAGNOSIS — M6281 Muscle weakness (generalized): Secondary | ICD-10-CM | POA: Diagnosis not present

## 2019-12-21 DIAGNOSIS — R279 Unspecified lack of coordination: Secondary | ICD-10-CM

## 2019-12-21 NOTE — Therapy (Signed)
[facility] [address] [place], [place], [address] Phone: [phone]   Fax:  [phone]  Physical Therapy Treatment  Patient Details  Name: Cynthia Velasquez MRN: [id number] Date of Birth: 1950/11/14 Referring Provider (PT): Truitt Merle, MD   Encounter Date: 12/21/2019  PT End of Session - 12/21/19 0929    Visit Number  14    Date for PT Re-Evaluation  12/28/19    Authorization Type  medicare    PT Start Time  0929    PT Stop Time  1016    PT Time Calculation (min)  47 min    Activity Tolerance  Patient tolerated treatment well    Behavior During Therapy  [facility] for tasks assessed/performed       Past Medical History:  Diagnosis Date  . Anxiety   . Arthritis   . Bilateral breast cancer ([place]) 10/21/2013  . Breast cancer ([place])   . Cancer ([place])   . Depression   . Gallstones   . GERD (gastroesophageal reflux disease)    doesn't take any meds for this  . Headache    h/o migraines      . History of bladder infections   . History of hiatal hernia   . History of migraine   . Hyperlipidemia    takes Simvastatin daily  . Hypertension    takes Hyzaar  . Joint pain   . Joint swelling   . Lumbar stenosis   . Neuropathy 09/07/2013  . Pneumonia   . Pre-diabetes     Past Surgical History:  Procedure Laterality Date  . ABDOMINAL HYSTERECTOMY    . bone spur removed from left foot    . CHOLECYSTECTOMY    . COLONOSCOPY    . double mastectomy     . HERNIA REPAIR     umbilical  . right knee arthroscopy    . right shoulder arthroscopy    . surgery for hiatal hernia    . TONSILLECTOMY    . TOTAL HIP ARTHROPLASTY Left 02/12/2013  . TOTAL HIP ARTHROPLASTY Left 02/12/2013   Procedure: LEFT TOTAL HIP ARTHROPLASTY;  Surgeon: Kerin Salen, MD;  Location: [place];  Service: Orthopedics;  Laterality: Left;  . TOTAL HIP ARTHROPLASTY Right 05/03/2013   Procedure: TOTAL HIP ARTHROPLASTY;  Surgeon: Kerin Salen, MD;  Location: [place];  Service: Orthopedics;  Laterality: Right;  . TOTAL KNEE ARTHROPLASTY Right 02/27/2015   Procedure: TOTAL KNEE ARTHROPLASTY;  Surgeon: Frederik Pear, MD;  Location: [place];  Service: Orthopedics;  Laterality: Right;  . TOTAL SHOULDER ARTHROPLASTY Right 09/12/2016   Procedure: RIGHT TOTAL SHOULDER ARTHROPLASTY;  Surgeon: Tania Ade, MD;  Location: [place];  Service: Orthopedics;  Laterality: Right;  RIGHT TOTAL SHOULDER ARTHROPLASTY    There were no vitals filed for this visit.  Subjective Assessment - 12/21/19 1009    Subjective  Pt states she was very sore after previous session but it didn't last long.  States it was likely because she hadn't done that much in a while    Patient Stated Goals  leakage, energy levels, being able to sleep at night    Currently in Pain?  No/denies                       [facility] Adult PT Treatment/Exercise - 12/21/19 0001      Lumbar Exercises: Stretches   Active Hamstring Stretch  Right;Left;3 reps;20 seconds    Gastroc Stretch  Right;Left;2  reps;20 seconds    Other Lumbar Stretch Exercise  lumbar flexion x 10 rolling red ball      Lumbar Exercises: Aerobic   Nustep  L3 x 10 min      Lumbar Exercises: Standing   Row  Strengthening;Right;Left;20 reps;Theraband    Theraband Level (Row)  Level 2 (Red)    Shoulder Extension  Strengthening;Right;Left;20 reps;Theraband    Theraband Level (Shoulder Extension)  Level 2 (Red)    Other Standing Lumbar Exercises  tapping steps (standing hip flexion, wall slide mini squat red ball with yellow band; side steps with yellow band - 2x10 each with kegel               PT Short Term Goals - 11/16/19 1035      PT SHORT TERM GOAL #2   Title  pt will report 30% less leakage    Status  Achieved        PT Long Term Goals - 11/16/19 [id number]      PT LONG TERM GOAL #1   Title  Pt will be able to reduce leakage and use only 1-2 pads/day    Baseline  same amount of pads    Time  12    Period   Weeks    Status  On-going    Target Date  02/08/20      PT LONG TERM GOAL #2   Title  Pt will report no nocturia    Baseline  leakage at night, but not a lot and I can't tell that I have to go    Time  12    Period  Weeks    Status  On-going    Target Date  02/08/20      PT LONG TERM GOAL #3   Title  Pt will report she can feel the urge to urinate at least 90% of the time due to improved health of pelvic floor muscles    Time  12    Period  Weeks    Status  On-going    Target Date  02/08/20      PT LONG TERM GOAL #4   Title  Pt will report at least 60% less leakage due to improved pelvic strength    Baseline  90% better (then states that she is talking about when she can feel the urge)    Time  12    Period  Weeks    Status  On-going    Target Date  02/08/20      PT LONG TERM GOAL #5   Title  Pt will be able to exercise consistently for at least 20 minutes 4-5 days/week due to improved function and energy levels    Baseline  falls asleep throughout the day and feels no energy    Time  12    Period  Weeks    Status  New    Target Date  02/08/20            Plan - 12/21/19 1020    Clinical Impression Statement  Pt tolerated exercises well today.  She fatigues with wall slides and needed to do some stretches in between reps.  Pt continues to demosntrate hip and core weakness and needed cue to activate core rather than increasing her lumbar extension using lumbar extensors.  she will continue to benefit from skilled PT to work on exercise and strength progression.    PT Treatment/Interventions  ADLs/Self Care Home Management;Biofeedback;Electrical Stimulation;Cryotherapy;Moist Heat;Functional mobility training;Therapeutic  activities;Therapeutic exercise;Patient/family education;Neuromuscular re-education;Taping;Dry needling;Manual techniques    PT Next Visit Plan  small lunges, hip and core strength progress as tolerated    PT Home Exercise Plan  Access Code: [email or web address]     Consulted and Agree with Plan of Care  Patient       Patient will benefit from skilled therapeutic intervention in order to improve the following deficits and impairments:  Increased muscle spasms, Impaired tone, Decreased range of motion, Decreased strength, Increased fascial restricitons, Impaired flexibility, Postural dysfunction, Pain  Visit Diagnosis: Unspecified lack of coordination  Muscle weakness (generalized)     Problem List Patient Active Problem List   Diagnosis Date Noted  . Anal cancer ([place]) 06/13/2019  . Hematuria, gross 03/11/2017  . Status post total shoulder arthroplasty, right 09/12/2016  . Arthritis of knee 02/27/2015  . Primary osteoarthritis of right knee Valgus 02/24/2015  . Neuropathy 09/07/2013  . Breast cancer of upper-outer quadrant of left female breast ([place]) 08/09/2013  . Breast cancer of upper-outer quadrant of right female breast ([place]) 08/09/2013  . Osteoarthritis of right hip 05/03/2013  . Osteoarthritis of left hip 02/15/2013    Jule Ser, PT 12/21/2019, 10:37 AM  [facility] [address] [place], [place], [address] Phone: [phone]   Fax:  [phone]  Name: Renell Picardi MRN: [id number] Date of Birth: Apr 25, 1951

## 2019-12-22 ENCOUNTER — Telehealth: Payer: Self-pay | Admitting: *Deleted

## 2019-12-22 DIAGNOSIS — C210 Malignant neoplasm of anus, unspecified: Secondary | ICD-10-CM | POA: Diagnosis not present

## 2019-12-22 NOTE — Telephone Encounter (Signed)
RECEIVED PHONE CALL FROM DR. GANEM'S OFFICE, STATING THAT THEY FINALLY GOT UP WITH THIS PATIENT AND SHE IS COMING IN TO SEE DR. GANEM TODAY AND HER ANOSCOPY WILL BE ON 01-10-20

## 2019-12-22 NOTE — Telephone Encounter (Signed)
RECEIVED PHONE CALL FROM DR. GANEM'S OFFICE TO INFORM THAT THEY HAVE CALLED THIS PATIENT TO ARRANGE ANOSCOPY AND SHE IS NOT RETURNING THEIR CALL, DR. Estell Harpin OFFICE IS GOING TO MAIL THIS PATIENT A LETTER

## 2019-12-23 ENCOUNTER — Inpatient Hospital Stay: Payer: 59

## 2019-12-23 ENCOUNTER — Other Ambulatory Visit: Payer: Self-pay

## 2019-12-23 ENCOUNTER — Inpatient Hospital Stay: Payer: 59 | Attending: Hematology | Admitting: Nurse Practitioner

## 2019-12-23 ENCOUNTER — Encounter: Payer: Self-pay | Admitting: Nurse Practitioner

## 2019-12-23 VITALS — BP 155/104 | HR 66 | Temp 98.3°F | Resp 18 | Ht 65.0 in | Wt 216.3 lb

## 2019-12-23 DIAGNOSIS — M545 Low back pain: Secondary | ICD-10-CM | POA: Insufficient documentation

## 2019-12-23 DIAGNOSIS — Z853 Personal history of malignant neoplasm of breast: Secondary | ICD-10-CM | POA: Insufficient documentation

## 2019-12-23 DIAGNOSIS — Z79899 Other long term (current) drug therapy: Secondary | ICD-10-CM | POA: Diagnosis not present

## 2019-12-23 DIAGNOSIS — I10 Essential (primary) hypertension: Secondary | ICD-10-CM | POA: Insufficient documentation

## 2019-12-23 DIAGNOSIS — F419 Anxiety disorder, unspecified: Secondary | ICD-10-CM | POA: Diagnosis not present

## 2019-12-23 DIAGNOSIS — C210 Malignant neoplasm of anus, unspecified: Secondary | ICD-10-CM | POA: Insufficient documentation

## 2019-12-23 DIAGNOSIS — G8929 Other chronic pain: Secondary | ICD-10-CM | POA: Insufficient documentation

## 2019-12-23 DIAGNOSIS — G3184 Mild cognitive impairment, so stated: Secondary | ICD-10-CM | POA: Insufficient documentation

## 2019-12-23 LAB — CBC WITH DIFFERENTIAL (CANCER CENTER ONLY)
Abs Immature Granulocytes: 0 10*3/uL (ref 0.00–0.07)
Basophils Absolute: 0 10*3/uL (ref 0.0–0.1)
Basophils Relative: 1 %
Eosinophils Absolute: 0.1 10*3/uL (ref 0.0–0.5)
Eosinophils Relative: 2 %
HCT: 39.1 % (ref 36.0–46.0)
Hemoglobin: 12.1 g/dL (ref 12.0–15.0)
Immature Granulocytes: 0 %
Lymphocytes Relative: 24 %
Lymphs Abs: 0.8 10*3/uL (ref 0.7–4.0)
MCH: 28.7 pg (ref 26.0–34.0)
MCHC: 30.9 g/dL (ref 30.0–36.0)
MCV: 92.7 fL (ref 80.0–100.0)
Monocytes Absolute: 0.5 10*3/uL (ref 0.1–1.0)
Monocytes Relative: 13 %
Neutro Abs: 2.1 10*3/uL (ref 1.7–7.7)
Neutrophils Relative %: 60 %
Platelet Count: 161 10*3/uL (ref 150–400)
RBC: 4.22 MIL/uL (ref 3.87–5.11)
RDW: 13.8 % (ref 11.5–15.5)
WBC Count: 3.4 10*3/uL — ABNORMAL LOW (ref 4.0–10.5)
nRBC: 0 % (ref 0.0–0.2)

## 2019-12-23 LAB — IRON AND TIBC
Iron: 47 ug/dL (ref 41–142)
Saturation Ratios: 18 % — ABNORMAL LOW (ref 21–57)
TIBC: 266 ug/dL (ref 236–444)
UIBC: 219 ug/dL (ref 120–384)

## 2019-12-23 LAB — CMP (CANCER CENTER ONLY)
ALT: 13 U/L (ref 0–44)
AST: 17 U/L (ref 15–41)
Albumin: 3.6 g/dL (ref 3.5–5.0)
Alkaline Phosphatase: 82 U/L (ref 38–126)
Anion gap: 5 (ref 5–15)
BUN: 14 mg/dL (ref 8–23)
CO2: 27 mmol/L (ref 22–32)
Calcium: 9.1 mg/dL (ref 8.9–10.3)
Chloride: 109 mmol/L (ref 98–111)
Creatinine: 0.89 mg/dL (ref 0.44–1.00)
GFR, Est AFR Am: >60 mL/min (ref >60)
GFR, Estimated: >60 mL/min (ref >60)
Glucose, Bld: 96 mg/dL (ref 70–99)
Potassium: 4.2 mmol/L (ref 3.5–5.1)
Sodium: 141 mmol/L (ref 135–145)
Total Bilirubin: 0.4 mg/dL (ref 0.3–1.2)
Total Protein: 7.4 g/dL (ref 6.5–8.1)

## 2019-12-23 LAB — TSH: TSH: 1.578 u[IU]/mL (ref 0.308–3.960)

## 2019-12-23 LAB — FERRITIN: Ferritin: 26 ng/mL (ref 11–307)

## 2019-12-23 NOTE — Progress Notes (Signed)
[facility]   [phone] [phone]   Clinic Follow up Note   Patient Care Team: Kathyrn Lass, MD as PCP - General (Family Medicine) Wonda Horner, MD as Consulting Physician (Gastroenterology) Truitt Merle, MD as Consulting Physician (Hematology) Alla Feeling, NP as Nurse Practitioner (Nurse Practitioner) Kyung Rudd, MD as Consulting Physician (Radiation Oncology) 12/23/2019  CHIEF COMPLAINT: F/u fatigue, mental fog, urinary and bowel issues   SUMMARY OF ONCOLOGIC HISTORY: Oncology History Overview Note  Breast cancer of upper-outer quadrant of right female breast, (ER/PR positive, her2/Neu negative)   Primary site: Breast (Right)   Staging method: AJCC 7th Edition   Clinical: Stage IA (T1a, N0, cM0) signed by [name], MD on 09/07/2013 11:14 AM   Pathologic: Stage IA (T1a, N0, cM0) signed by [name], MD on 09/07/2013 11:14 AM   Summary: Stage IA (T1a, N0, cM0)  Breast cancer of upper-outer quadrant of left female breast (ER/PR/Her2Neu negative)   Primary site: Breast (Left)   Staging method: AJCC 7th Edition   Clinical: Stage IIA (T2, N0, cM0) signed by [name], MD on 09/07/2013 11:35 AM   Pathologic: Stage IIA (T2, N0, cM0) signed by [name], MD on 09/07/2013 11:35 AM   Summary: Stage IIA (T2, N0, cM0)     Breast cancer of upper-outer quadrant of left female breast ([name])  07/10/2007 Procedure   US biopsy showed invasive ductal carcinoma 0.8cm, Nottingham grade 3   07/30/2007 Surgery   She had left breast lumpectomy and LN biopsy which showed high grade invasive ductal cancer Nottingham grade 3, 2.7 cm, ER/PR/Her2 neu negative   11/24/2007 Surgery   Patient elected for bilateral mastectomy   09/07/2008 - 03/08/2009 Chemotherapy   dates are approximate: she received adriamycin and cytoxan followed by Taxol   03/08/2009 - 05/08/2009 Radiation Therapy   dates are approximate, she received XRT    Breast cancer of upper-outer  quadrant of right female breast ([place])  07/10/2006 Procedure   stereotactic biopsy showed atypical hyperplasia   10/23/2006 Surgery   right lumpectomy showed invasive ductal ca (Nottingham grade 1)  61m and DCIS, ER/PR positive Her 2 negative   10/09/2007 - 10/08/2012 Chemotherapy   Patient was placed on Tamoxifen   03/24/2017 Imaging   No acute findings. No evidence of recurrent carcinoma or metastatic disease   Anal cancer ([place]  06/04/2019 Procedure   Colonoscopy per Dr. SAnson Fret Findings-the digital rectal exam revealed a 3 cm diameter firm rectal mass.  The mass was noncircumferential and located predominantly at the right bowel wall at the anorectal junction. A nonobstructing mass was found at the anus and in the rectum    06/04/2019 Initial Biopsy   Follow pathology: Large intestine, rectum biopsy: Invasive well to moderately differentiated squamous cell carcinoma.  No rectal mucosa present.  There is strong diffuse expression of P 16 immunostain.  CDX 2, p63 and mCEA immunostains are also used in the diagnostic work-up of the case.   06/04/2019 Initial Diagnosis   [place]   06/21/2019 PET scan   IMPRESSION: 1. Anorectal primary. No hypermetabolic metastatic disease within the chest, abdomen, or pelvis. Perirectal nodes, at least 1 of which is new since 02/26/2018 CT, suspicious based on size and interval development. 2. Mild limitations secondary to beam hardening artifact from bilateral hip arthroplasty. 3.  Aortic Atherosclerosis (ICD10-I70.0).   06/28/2019 Cancer Staging   Staging form: Anus, AJCC 8th Edition - Clinical stage from 06/28/2019: Stage IIA (cT2, cN0,  cM0) - Signed by Alla Feeling, NP on 06/28/2019   06/28/2019 - 07/26/2019 Chemotherapy   concurrent chemoRT with Mitomycin and 5FU on week 1 and week 5 on starting 06/28/19. Last dose on 07/26/19   06/28/2019 - 08/09/2019 Radiation Therapy   concurrent chemoRT with Dr [name] 06/28/19-08/09/19    11/01/2019 PET scan   IMPRESSION: 1. Marked interval decrease in hypermetabolism noted at the level of the anal rectal primary. No evidence for hypermetabolic metastatic disease in the chest, abdomen, or pelvis. The perirectal lymph nodes identified previously have resolved in the interval. 2.  Aortic Atherosclerois (ICD10-170.0)     CURRENT THERAPY: S/p curative intent chemoRT with 5FU/mitomycin completed October to November 2020, now on surveillance   INTERVAL HISTORY: [name] returns for work in visit to address multiple concerns. She is having word finding trouble, forgetfulness, mental fog since treatment. This is stressful for her and causes arguments with her family. This occurred after breast cancer treatment. She thinks one of her chemo was red. In addition she has fatigue but "no problems sleeping." The issue is that she has a "fear of dying in her sleep" therefore 50% of the time will stay up all night. She also has a reasonable fear of cancer recurrence or getting a third type of cancer. When she does sleep, she sleeps well and feels rested. She is out of bed and active at home all day. She snores, denies h/o sleep apnea. She also has a fear of going out bc of COVID despite she is fully vaccinated. She continues to have pain and weakness in her legs, as well as chronic low back pain. She continues pelvic floor PT and has started working on strengthening her legs. She has received injections per ortho for back pain which helps significantly. She is no longer on any pain medication. She states she knows she doesn't have a UTI but urine "burns like acid at the onset," then improves mid-stream. This is overall improving with PT. She still notes small calibre stool which is usually followed by an uncontrollable BM. No rectal bleeding. No significant anal/rectal pain, this has markedly improved after chemoRT. She presents today to work through her issues, have someone to talk to. She will see  Dr. Penelope Coop for anoscopy on 01/10/20.    MEDICAL HISTORY:  Past Medical History:  Diagnosis Date  . Anxiety   . Arthritis   . Bilateral breast cancer ([place]) 10/21/2013  . Breast cancer ([place])   . Cancer ([place])   . Depression   . Gallstones   . GERD (gastroesophageal reflux disease)    doesn't take any meds for this  . Headache    h/o migraines      . History of bladder infections   . History of hiatal hernia   . History of migraine   . Hyperlipidemia    takes Simvastatin daily  . Hypertension    takes Hyzaar  . Joint pain   . Joint swelling   . Lumbar stenosis   . Neuropathy 09/07/2013  . Pneumonia   . Pre-diabetes     SURGICAL HISTORY: Past Surgical History:  Procedure Laterality Date  . ABDOMINAL HYSTERECTOMY    . bone spur removed from left foot    . CHOLECYSTECTOMY    . COLONOSCOPY    . double mastectomy     . HERNIA REPAIR     umbilical  . right knee arthroscopy    . right shoulder arthroscopy    . surgery  for hiatal hernia    . TONSILLECTOMY    . TOTAL HIP ARTHROPLASTY Left 02/12/2013  . TOTAL HIP ARTHROPLASTY Left 02/12/2013   Procedure: LEFT TOTAL HIP ARTHROPLASTY;  Surgeon: Kerin Salen, MD;  Location: [place];  Service: Orthopedics;  Laterality: Left;  . TOTAL HIP ARTHROPLASTY Right 05/03/2013   Procedure: TOTAL HIP ARTHROPLASTY;  Surgeon: Kerin Salen, MD;  Location: [facility];  Service: Orthopedics;  Laterality: Right;  . TOTAL KNEE ARTHROPLASTY Right 02/27/2015   Procedure: TOTAL KNEE ARTHROPLASTY;  Surgeon: Frederik Pear, MD;  Location: [facility];  Service: Orthopedics;  Laterality: Right;  . TOTAL SHOULDER ARTHROPLASTY Right 09/12/2016   Procedure: RIGHT TOTAL SHOULDER ARTHROPLASTY;  Surgeon: Tania Ade, MD;  Location: [place];  Service: Orthopedics;  Laterality: Right;  RIGHT TOTAL SHOULDER ARTHROPLASTY    I have reviewed the social history and family history with the patient and they are unchanged from previous note.  ALLERGIES:  is allergic to  oxycodone-acetaminophen; codeine; and vicodin [hydrocodone-acetaminophen].  MEDICATIONS:  Current Outpatient Medications  Medication Sig Dispense Refill  . LINZESS 72 MCG capsule     . losartan (COZAAR) 100 MG tablet TK 1 T PO D  6  . ondansetron (ZOFRAN ODT) 4 MG disintegrating tablet Take 1 tablet (4 mg total) by mouth every 8 (eight) hours as needed for nausea or vomiting. 6 tablet 0  . PROAIR HFA 108 (90 Base) MCG/ACT inhaler 2 inhalations every 4-6 hours as needed. (Patient taking differently: Inhale 2 puffs into the lungs every 6 (six) hours as needed for wheezing or shortness of breath. ) 18 g 1  . rosuvastatin (CRESTOR) 40 MG tablet Take 40 mg by mouth daily.    . benzonatate (TESSALON) 100 MG capsule Take 1-2 capsules (100-200 mg total) by mouth 3 (three) times daily as needed for cough. 40 capsule 0  . potassium chloride 20 MEQ/15ML (10%) SOLN Take 15 mLs (20 mEq total) by mouth 2 (two) times daily. (Patient not taking: Reported on 12/23/2019) 473 mL 0   No current facility-administered medications for this visit.    PHYSICAL EXAMINATION: ECOG PERFORMANCE STATUS: 1 - Symptomatic but completely ambulatory  Vitals:   12/23/19 1112  BP: (!) 158/103  Pulse: 66  Resp: 18  Temp: 98.3 F (36.8 C)  SpO2: 100%   Filed Weights   12/23/19 1112  Weight: 216 lb 4.8 oz (98.1 kg)    GENERAL:alert, no distress and comfortable SKIN: no rash  EYES: sclera clear LUNGS: normal breathing effort HEART:  no lower extremity edema Musculoskeletal: normal gait NEURO: alert & oriented x 3 with fluent speech Rectal exam deferred   LABORATORY DATA:  I have reviewed the data as listed CBC Latest Ref Rng & Units 12/23/2019 10/25/2019 09/27/2019  WBC 4.0 - 10.5 K/uL 3.4(L) 3.2(L) 3.2(L)  Hemoglobin 12.0 - 15.0 g/dL 12.1 10.9(L) 10.6(L)  Hematocrit 36.0 - 46.0 % 39.1 35.2(L) 33.8(L)  Platelets 150 - 400 K/uL 161 149(L) 129(L)     CMP Latest Ref Rng & Units 12/23/2019 10/25/2019 09/27/2019  Glucose  70 - 99 mg/dL 96 90 85  BUN 8 - 23 mg/dL '14 13 14  ' Creatinine 0.44 - 1.00 mg/dL 0.89 0.91 0.98  Sodium 135 - 145 mmol/L 141 143 142  Potassium 3.5 - 5.1 mmol/L 4.2 3.9 3.8  Chloride 98 - 111 mmol/L 109 110 112(H)  CO2 22 - 32 mmol/L '27 26 23  ' Calcium 8.9 - 10.3 mg/dL 9.1 9.1 8.7(L)  Total Protein 6.5 - 8.1  g/dL 7.4 6.9 6.5  Total Bilirubin 0.3 - 1.2 mg/dL 0.4 0.4 0.3  Alkaline Phos 38 - 126 U/L 82 72 71  AST 15 - 41 U/L '17 18 25  ' ALT 0 - 44 U/L '13 15 18      ' RADIOGRAPHIC STUDIES: I have personally reviewed the radiological images as listed and agreed with the findings in the report. No results found.   ASSESSMENT & PLAN:   1. Fatigue, cognitive difficulty   -word finding, forgetfulness -occurred after AC-T for breast cancer and now after anal cancer, likely neurotoxicity from chemotherapy -stays up half nights due to certain fears that she could die in her sleep. She agrees to SW and chaplain referrals for her fears - including fear of cancer recurrence. I offered emotional support that many people feel anxious after their primary treatment is completed and are in the surveillance phase.  -Reports snoring - I recommend to consider a sleep study to r/o sleep apnea as contributor to fatigue  -CBC and CMP are normal, no anemia. Add on TSH today also normal  -her PET from 10/2019 showed cardiomyopathy, she did receive anthracycline. Echo on 11/24/19 was unremarkable. Will ask cardio-oncology to review her echo to see if there is cardiac etiology of her fatigue  -Recent anoscopy on 11/15/19 by Dr. [name] showed no evidence of recurrence, I don't feel this is cancer-related fatigue, although it is likely treatment-related   2. Anal Squamous Cell Carcinoma, cT2N0M0 clinical stage IIA -She was diagnosed in 05/2019.She presented with rectal bleeding and anal pressure.Biopsy showeda 3 cm mass at the anorectal junction, path confirmedwell to moderately differentiated squamous cell  carcinoma.10/5/20PET scan shows no metastasis. -S/p curative intent chemoRT with mitomycin and 5FUcompletedon 08/09/19 -her rectal painimproved after treatment, and rectal bleeding and constipation resolved shortly after chemoRT.  -From 12/20 to 2/21 she has recurrent constipation, intermittent rectal bleeding, and stool leakage. Her rectal pain worsened  -rectal exam on 10/25/19 revealed no palpable anal mass.  -PET scan on 11/01/19 showed marked interval decrease in hypermetabolism of the primary anal mass and resolution of the perirectal lymph nodes. No evidence of metastatic disease in the chest, abdomen or pelvis. Overall great response to treatment. On surveillance.  -Anoscopy on 11/15/19 by Dr. [name] showed no evidence of recurrence -next f/u 01/10/20 by Dr. Penelope Coop  -We will see her back in 03/2020  3. Rectal pain, rectal bleeding, constipation  -she presented with rectal pain, bleeding and constipation. Bleeding and constipation resolved after she completed chemoRT, and rectal pain improved -constipation, rectal painworsened from 12/20 to 2/21. She takes mostly tylenol but rarely requires dilaudid. I encouraged her to try to wean off. -since completing treatment she notes small calibre stool, followed by an uncontrollable BM. She will have anoscopy per Dr. Penelope Coop on 01/10/20 -rectal pain has significantly improved after chemoRT, now only with prolonged sitting  4. UTI ? dysuria, Low urine output -UA on 11/18 showed positive nitrites, trace leukocytes, 11-20 WBC and bacteria. S/p cipro x3 days. Repeat UA 08/09/19 was negative -dysuria and vaginal irritation improved overtime with pyridium and pelvic PT -UA/culture on 11/03/19 showed insignificant growth, but she completed empiric antibiotics before culture was complete. UTI symptoms resolved -she has burning with onset of her stream, then resolves, improved with pelvic PT which she continues   5. Low/sacral back pain -she reports this  is chronic issue that comes and goes -she hadsignificant tenderness to palpationin low back near sacrum on 1/11. She notes this is not new  for her. -received joint injections in the past, per ortho. She can go back to them for eval/management  -She has leg pain related to weakness, improving with PT   6. H/o bilateral breast cancer -2007: right breast biopsy showed atypical hyperplasia, s/p right lumpectomy in 06/2006 ER/PR+/HER2-, grade 1. Completed tamoxifen from 09/2007 - 09/2012 -2008: left breast invasive ductal carcinoma, triple negative, grade 3 s/p lumpectomy in 07/2007 and eventual bilateral mastectomy; s/p adjuvant chemo AC-T and radiation   7. Genetics -patient is adopted, no known family history  -she reportedly had genetic testing which was negative. Her daughter also negative. I do not have the report.  7. HTN -on losartan -did not take medication today, BP elevated in clinic   8.Pancytopenia  -secondary to chemo, and iron deficiency. -iron studies are in low range of normal -resolved now   PLAN: -Labs reviewed -SW/Chaplain referral for emotional/anxiety support  -Patient to call ortho for chronic low back pain and possible injection -Continue PT for lower extremity strengthening and pelvic floor -Add biotin and continue multivitamin for brittle nails -Cardio-oncology to review echo and possible referral  -Consider sleep study to r/o sleep apnea contributing to fatigue  -Continue anal cancer surveillance, f/u Dr. Penelope Coop 01/10/20, f/u [facility] 03/2020 or sooner if needed, and Dr. [name] as scheduled   No problem-specific Assessment & Plan notes found for this encounter.   Orders Placed This Encounter  Procedures  . TSH    Standing Status:   Future    Number of Occurrences:   1    Standing Expiration Date:   12/22/2020   All questions were answered. The patient knows to call the clinic with any problems, questions or concerns. No barriers to learning was  detected. Total encounter time is 45 minutes.      Alla Feeling, NP 12/23/19

## 2019-12-24 ENCOUNTER — Telehealth: Payer: Self-pay | Admitting: Hematology

## 2019-12-24 ENCOUNTER — Ambulatory Visit: Payer: 59 | Admitting: Physical Therapy

## 2019-12-24 DIAGNOSIS — M6281 Muscle weakness (generalized): Secondary | ICD-10-CM

## 2019-12-24 DIAGNOSIS — R279 Unspecified lack of coordination: Secondary | ICD-10-CM

## 2019-12-24 NOTE — Therapy (Signed)
[facility] [address] [place], [place], [address] Phone: [phone]   Fax:  [phone]  Physical Therapy Treatment  Patient Details  Name: Cynthia Velasquez MRN: [id number] Date of Birth: [birth year] Referring Provider (PT): Truitt Merle, MD   Encounter Date: 12/24/2019  PT End of Session - 12/24/19 0943    Visit Number  15    Date for PT Re-Evaluation  02/08/20    Authorization Type  medicare    PT Start Time  [phone]   arrived late   PT Stop Time  1015    PT Time Calculation (min)  37 min    Activity Tolerance  Patient tolerated treatment well    Behavior During Therapy  [facility] for tasks assessed/performed       Past Medical History:  Diagnosis Date  . Anxiety   . Arthritis   . Bilateral breast cancer ([place]) 10/21/2013  . Breast cancer ([place])   . Cancer ([place])   . Depression   . Gallstones   . GERD (gastroesophageal reflux disease)    doesn't take any meds for this  . Headache    h/o migraines      . History of bladder infections   . History of hiatal hernia   . History of migraine   . Hyperlipidemia    takes Simvastatin daily  . Hypertension    takes Hyzaar  . Joint pain   . Joint swelling   . Lumbar stenosis   . Neuropathy 09/07/2013  . Pneumonia   . Pre-diabetes     Past Surgical History:  Procedure Laterality Date  . ABDOMINAL HYSTERECTOMY    . bone spur removed from left foot    . CHOLECYSTECTOMY    . COLONOSCOPY    . double mastectomy     . HERNIA REPAIR     umbilical  . right knee arthroscopy    . right shoulder arthroscopy    . surgery for hiatal hernia    . TONSILLECTOMY    . TOTAL HIP ARTHROPLASTY Left 02/12/2013  . TOTAL HIP ARTHROPLASTY Left 02/12/2013   Procedure: LEFT TOTAL HIP ARTHROPLASTY;  Surgeon: Kerin Salen, MD;  Location: [place];  Service: Orthopedics;  Laterality: Left;  . TOTAL HIP ARTHROPLASTY Right 05/03/2013   Procedure: TOTAL HIP ARTHROPLASTY;  Surgeon: Kerin Salen,  MD;  Location: [place];  Service: Orthopedics;  Laterality: Right;  . TOTAL KNEE ARTHROPLASTY Right 02/27/2015   Procedure: TOTAL KNEE ARTHROPLASTY;  Surgeon: Frederik Pear, MD;  Location: [place];  Service: Orthopedics;  Laterality: Right;  . TOTAL SHOULDER ARTHROPLASTY Right 09/12/2016   Procedure: RIGHT TOTAL SHOULDER ARTHROPLASTY;  Surgeon: Tania Ade, MD;  Location: [place];  Service: Orthopedics;  Laterality: Right;  RIGHT TOTAL SHOULDER ARTHROPLASTY    There were no vitals filed for this visit.  Subjective Assessment - 12/24/19 1015    Subjective  Pt reports she was not sore after previous session.  States she has been running to the bathroom all morning and almost didn't make it today.    Patient Stated Goals  leakage, energy levels, being able to sleep at night    Currently in Pain?  No/denies                       [facility] Adult PT Treatment/Exercise - 12/24/19 0001      Lumbar Exercises: Stretches   Active Hamstring Stretch  Right;Left;3 reps;20 seconds    Gastroc Stretch  Right;Left;2 reps;20 seconds      Lumbar Exercises: Aerobic   Nustep  L3 x 10 min   PT present to monitor     Lumbar Exercises: Standing   Wall Slides Limitations  wall slide ball behind back for support - 20x    Other Standing Lumbar Exercises  hip abduction and extension - red band  10x; step ups on 4inch 20x; tapping step one finger for balance 20x               PT Short Term Goals - 11/16/19 1035      PT SHORT TERM GOAL #2   Title  pt will report 30% less leakage    Status  Achieved        PT Long Term Goals - 11/16/19 [id number]      PT LONG TERM GOAL #1   Title  Pt will be able to reduce leakage and use only 1-2 pads/day    Baseline  same amount of pads    Time  12    Period  Weeks    Status  On-going    Target Date  02/08/20      PT LONG TERM GOAL #2   Title  Pt will report no nocturia    Baseline  leakage at night, but not a lot and I can't tell that I have to go     Time  12    Period  Weeks    Status  On-going    Target Date  02/08/20      PT LONG TERM GOAL #3   Title  Pt will report she can feel the urge to urinate at least 90% of the time due to improved health of pelvic floor muscles    Time  12    Period  Weeks    Status  On-going    Target Date  02/08/20      PT LONG TERM GOAL #4   Title  Pt will report at least 60% less leakage due to improved pelvic strength    Baseline  90% better (then states that she is talking about when she can feel the urge)    Time  12    Period  Weeks    Status  On-going    Target Date  02/08/20      PT LONG TERM GOAL #5   Title  Pt will be able to exercise consistently for at least 20 minutes 4-5 days/week due to improved function and energy levels    Baseline  falls asleep throughout the day and feels no energy    Time  12    Period  Weeks    Status  New    Target Date  02/08/20            Plan - 12/24/19 1112    Clinical Impression Statement  Pt did well during treatment today and did not have any urgency.  Pt tolerated exercises in standing with stretches added when she began to demonstrate too much fatigue.  Pt is doing better with less soreness after treatment and will benefit from skilled PT to continue to progress overall core and pelvic strength in order to manage her symtpoms and improve function.    PT Treatment/Interventions  ADLs/Self Care Home Management;Biofeedback;Electrical Stimulation;Cryotherapy;Moist Heat;Functional mobility training;Therapeutic activities;Therapeutic exercise;Patient/family education;Neuromuscular re-education;Taping;Dry needling;Manual techniques    PT Next Visit Plan  small lunges, hip and core strength progress as tolerated    PT Home Exercise  Plan  Access Code: [email or web address] and Agree with Plan of Care  Patient       Patient will benefit from skilled therapeutic intervention in order to improve the following deficits and impairments:  Increased muscle  spasms, Impaired tone, Decreased range of motion, Decreased strength, Increased fascial restricitons, Impaired flexibility, Postural dysfunction, Pain  Visit Diagnosis: Unspecified lack of coordination  Muscle weakness (generalized)     Problem List Patient Active Problem List   Diagnosis Date Noted  . Anal cancer ([place]) 06/13/2019  . Hematuria, gross 03/11/2017  . Status post total shoulder arthroplasty, right 09/12/2016  . Arthritis of knee 02/27/2015  . Primary osteoarthritis of right knee Valgus 02/24/2015  . Neuropathy 09/07/2013  . Breast cancer of upper-outer quadrant of left female breast ([place]) 08/09/2013  . Breast cancer of upper-outer quadrant of right female breast ([place]) 08/09/2013  . Osteoarthritis of right hip 05/03/2013  . Osteoarthritis of left hip 02/15/2013    Jule Ser, PT 12/24/2019, 11:18 AM  [facility] [address], [place], [place], [address] Phone: [phone]   Fax:  [phone]  Name: Terrina Kun MRN: [id number] Date of Birth: [birth year]

## 2019-12-24 NOTE — Telephone Encounter (Signed)
Scheduled appt per 4/8 los.  Printed and mailed appt calendar.

## 2019-12-28 ENCOUNTER — Ambulatory Visit: Payer: 59 | Admitting: Physical Therapy

## 2019-12-28 DIAGNOSIS — C20 Malignant neoplasm of rectum: Secondary | ICD-10-CM | POA: Diagnosis not present

## 2019-12-28 DIAGNOSIS — I10 Essential (primary) hypertension: Secondary | ICD-10-CM | POA: Diagnosis not present

## 2019-12-28 DIAGNOSIS — F331 Major depressive disorder, recurrent, moderate: Secondary | ICD-10-CM | POA: Diagnosis not present

## 2019-12-28 DIAGNOSIS — F329 Major depressive disorder, single episode, unspecified: Secondary | ICD-10-CM | POA: Diagnosis not present

## 2019-12-28 DIAGNOSIS — Z853 Personal history of malignant neoplasm of breast: Secondary | ICD-10-CM | POA: Diagnosis not present

## 2019-12-28 DIAGNOSIS — M150 Primary generalized (osteo)arthritis: Secondary | ICD-10-CM | POA: Diagnosis not present

## 2019-12-28 DIAGNOSIS — E7800 Pure hypercholesterolemia, unspecified: Secondary | ICD-10-CM | POA: Diagnosis not present

## 2019-12-31 ENCOUNTER — Encounter: Payer: Self-pay | Admitting: Physical Therapy

## 2019-12-31 ENCOUNTER — Ambulatory Visit: Payer: 59 | Admitting: Physical Therapy

## 2019-12-31 ENCOUNTER — Other Ambulatory Visit: Payer: Self-pay

## 2019-12-31 DIAGNOSIS — R279 Unspecified lack of coordination: Secondary | ICD-10-CM

## 2019-12-31 DIAGNOSIS — M6281 Muscle weakness (generalized): Secondary | ICD-10-CM

## 2020-01-02 NOTE — Therapy (Signed)
[facility] [address], [place], [place], [address] Phone: [phone]   Fax:  [phone]  Physical Therapy Treatment  Patient Details  Name: Cynthia Velasquez MRN: [id number] Date of Birth: July 22, 1951 Referring Provider (PT): Truitt Merle, MD   Encounter Date: 12/31/2019  PT End of Session - 01/02/20 1356    Visit Number  16    Date for PT Re-Evaluation  02/08/20    Authorization Type  medicare    PT Start Time  0933    PT Stop Time  1015    PT Time Calculation (min)  42 min    Activity Tolerance  Patient tolerated treatment well    Behavior During Therapy  [facility] for tasks assessed/performed       Past Medical History:  Diagnosis Date  . Anxiety   . Arthritis   . Bilateral breast cancer ([place]) 10/21/2013  . Breast cancer ([place])   . Cancer ([place])   . Depression   . Gallstones   . GERD (gastroesophageal reflux disease)    doesn't take any meds for this  . Headache    h/o migraines      . History of bladder infections   . History of hiatal hernia   . History of migraine   . Hyperlipidemia    takes Simvastatin daily  . Hypertension    takes Hyzaar  . Joint pain   . Joint swelling   . Lumbar stenosis   . Neuropathy 09/07/2013  . Pneumonia   . Pre-diabetes     Past Surgical History:  Procedure Laterality Date  . ABDOMINAL HYSTERECTOMY    . bone spur removed from left foot    . CHOLECYSTECTOMY    . COLONOSCOPY    . double mastectomy     . HERNIA REPAIR     umbilical  . right knee arthroscopy    . right shoulder arthroscopy    . surgery for hiatal hernia    . TONSILLECTOMY    . TOTAL HIP ARTHROPLASTY Left 02/12/2013  . TOTAL HIP ARTHROPLASTY Left 02/12/2013   Procedure: LEFT TOTAL HIP ARTHROPLASTY;  Surgeon: Kerin Salen, MD;  Location: [place];  Service: Orthopedics;  Laterality: Left;  . TOTAL HIP ARTHROPLASTY Right 05/03/2013   Procedure: TOTAL HIP ARTHROPLASTY;  Surgeon: Kerin Salen, MD;  Location:  [place];  Service: Orthopedics;  Laterality: Right;  . TOTAL KNEE ARTHROPLASTY Right 02/27/2015   Procedure: TOTAL KNEE ARTHROPLASTY;  Surgeon: Frederik Pear, MD;  Location: [place];  Service: Orthopedics;  Laterality: Right;  . TOTAL SHOULDER ARTHROPLASTY Right 09/12/2016   Procedure: RIGHT TOTAL SHOULDER ARTHROPLASTY;  Surgeon: Tania Ade, MD;  Location: [place];  Service: Orthopedics;  Laterality: Right;  RIGHT TOTAL SHOULDER ARTHROPLASTY    There were no vitals filed for this visit.  Subjective Assessment - 01/02/20 1356    Subjective  Pt states she had a bad week with 3 accidents.  Not sure why or what she ate. One time she hadn't eated anything on her way to PT.    Patient Stated Goals  leakage, energy levels, being able to sleep at night    Currently in Pain?  No/denies                       [facility] Adult PT Treatment/Exercise - 01/02/20 0001      Neuro Re-ed    Neuro Re-ed Details   tapping and quick stretches to anal sphincter to  promote increased activation      Manual Therapy   Myofascial Release  abdominal release around colon and centrally inferior to naval - fascial release in all 6 planes               PT Short Term Goals - 11/16/19 1035      PT SHORT TERM GOAL #2   Title  pt will report 30% less leakage    Status  Achieved        PT Long Term Goals - 11/16/19 [id number]      PT LONG TERM GOAL #1   Title  Pt will be able to reduce leakage and use only 1-2 pads/day    Baseline  same amount of pads    Time  12    Period  Weeks    Status  On-going    Target Date  02/08/20      PT LONG TERM GOAL #2   Title  Pt will report no nocturia    Baseline  leakage at night, but not a lot and I can't tell that I have to go    Time  12    Period  Weeks    Status  On-going    Target Date  02/08/20      PT LONG TERM GOAL #3   Title  Pt will report she can feel the urge to urinate at least 90% of the time due to improved health of pelvic floor muscles     Time  12    Period  Weeks    Status  On-going    Target Date  02/08/20      PT LONG TERM GOAL #4   Title  Pt will report at least 60% less leakage due to improved pelvic strength    Baseline  90% better (then states that she is talking about when she can feel the urge)    Time  12    Period  Weeks    Status  On-going    Target Date  02/08/20      PT LONG TERM GOAL #5   Title  Pt will be able to exercise consistently for at least 20 minutes 4-5 days/week due to improved function and energy levels    Baseline  falls asleep throughout the day and feels no energy    Time  12    Period  Weeks    Status  New    Target Date  02/08/20            Plan - 01/02/20 1357    Clinical Impression Statement  Pt was re-assess for pelvic floor strength and endurance. Pt has 2-3/5 MMT but only holding for 1 second. She improved strength with TC tapping and stretching anal sphincter muscles and she is coordinating correctly when cued to bulge and lift her pelvic floor.  Pt responded well to STM to the abdominals.  She has tenderness across transverse colon but with increased bowel sounds and palpating movement after STM and fascial release  Pt will continue to benefit from skilled PT to address decreased strength and endurance.    PT Treatment/Interventions  ADLs/Self Care Home Management;Biofeedback;Electrical Stimulation;Cryotherapy;Moist Heat;Functional mobility training;Therapeutic activities;Therapeutic exercise;Patient/family education;Neuromuscular re-education;Taping;Dry needling;Manual techniques    PT Next Visit Plan  small lunges, hip and core strength progress as tolerated; gluteal strength;    PT Home Exercise Plan  Access Code: [email or web address]    Consulted and Agree with Plan of Care  Patient  Patient will benefit from skilled therapeutic intervention in order to improve the following deficits and impairments:  Increased muscle spasms, Impaired tone, Decreased range of motion, Decreased  strength, Increased fascial restricitons, Impaired flexibility, Postural dysfunction, Pain  Visit Diagnosis: Unspecified lack of coordination  Muscle weakness (generalized)     Problem List Patient Active Problem List   Diagnosis Date Noted  . Anal cancer ([place]) 06/13/2019  . Hematuria, gross 03/11/2017  . Status post total shoulder arthroplasty, right 09/12/2016  . Arthritis of knee 02/27/2015  . Primary osteoarthritis of right knee Valgus 02/24/2015  . Neuropathy 09/07/2013  . Breast cancer of upper-outer quadrant of left female breast ([place]) 08/09/2013  . Breast cancer of upper-outer quadrant of right female breast ([place]) 08/09/2013  . Osteoarthritis of right hip 05/03/2013  . Osteoarthritis of left hip 02/15/2013    Jule Ser, PT 01/02/2020, 2:02 PM  [facility] [address], [place], [place], [address] Phone: [phone]   Fax:  [phone]  Name: Patirica Suguitan MRN: [id number] Date of Birth: [birth year]

## 2020-01-03 DIAGNOSIS — M47816 Spondylosis without myelopathy or radiculopathy, lumbar region: Secondary | ICD-10-CM | POA: Diagnosis not present

## 2020-01-04 ENCOUNTER — Encounter: Payer: Self-pay | Admitting: Physical Therapy

## 2020-01-04 ENCOUNTER — Other Ambulatory Visit: Payer: Self-pay

## 2020-01-04 ENCOUNTER — Ambulatory Visit: Payer: 59 | Admitting: Physical Therapy

## 2020-01-04 DIAGNOSIS — R279 Unspecified lack of coordination: Secondary | ICD-10-CM

## 2020-01-04 DIAGNOSIS — M6281 Muscle weakness (generalized): Secondary | ICD-10-CM | POA: Diagnosis not present

## 2020-01-04 NOTE — Therapy (Signed)
[facility] [address] [place], [place], [address] Phone: [phone]   Fax:  [phone]  Physical Therapy Treatment  Patient Details  Name: Cynthia Velasquez MRN: [id number] Date of Birth: 19-May-1951 Referring Provider (PT): Truitt Merle, MD   Encounter Date: 01/04/2020  PT End of Session - 01/04/20 1000    Visit Number  17    Date for PT Re-Evaluation  02/08/20    Authorization Type  medicare    PT Start Time  0931    PT Stop Time  1009    PT Time Calculation (min)  38 min    Activity Tolerance  Patient tolerated treatment well    Behavior During Therapy  [facility] for tasks assessed/performed       Past Medical History:  Diagnosis Date  . Anxiety   . Arthritis   . Bilateral breast cancer ([place]) 10/21/2013  . Breast cancer ([place])   . Cancer ([place])   . Depression   . Gallstones   . GERD (gastroesophageal reflux disease)    doesn't take any meds for this  . Headache    h/o migraines      . History of bladder infections   . History of hiatal hernia   . History of migraine   . Hyperlipidemia    takes Simvastatin daily  . Hypertension    takes Hyzaar  . Joint pain   . Joint swelling   . Lumbar stenosis   . Neuropathy 09/07/2013  . Pneumonia   . Pre-diabetes     Past Surgical History:  Procedure Laterality Date  . ABDOMINAL HYSTERECTOMY    . bone spur removed from left foot    . CHOLECYSTECTOMY    . COLONOSCOPY    . double mastectomy     . HERNIA REPAIR     umbilical  . right knee arthroscopy    . right shoulder arthroscopy    . surgery for hiatal hernia    . TONSILLECTOMY    . TOTAL HIP ARTHROPLASTY Left 02/12/2013  . TOTAL HIP ARTHROPLASTY Left 02/12/2013   Procedure: LEFT TOTAL HIP ARTHROPLASTY;  Surgeon: Kerin Salen, MD;  Location: [place];  Service: Orthopedics;  Laterality: Left;  . TOTAL HIP ARTHROPLASTY Right 05/03/2013   Procedure: TOTAL HIP ARTHROPLASTY;  Surgeon: Kerin Salen, MD;  Location:  [place];  Service: Orthopedics;  Laterality: Right;  . TOTAL KNEE ARTHROPLASTY Right 02/27/2015   Procedure: TOTAL KNEE ARTHROPLASTY;  Surgeon: Frederik Pear, MD;  Location: [place];  Service: Orthopedics;  Laterality: Right;  . TOTAL SHOULDER ARTHROPLASTY Right 09/12/2016   Procedure: RIGHT TOTAL SHOULDER ARTHROPLASTY;  Surgeon: Tania Ade, MD;  Location: [place];  Service: Orthopedics;  Laterality: Right;  RIGHT TOTAL SHOULDER ARTHROPLASTY    There were no vitals filed for this visit.  Subjective Assessment - 01/04/20 0953    Subjective  Pt states her stomach is very gurgly today.  Pt went to the orthopedist for back pain and she is getting an injection in a couple of weeks 5/5    Patient Stated Goals  leakage, energy levels, being able to sleep at night    Currently in Pain?  Yes    Pain Score  7     Pain Location  Back    Pain Orientation  Lower    Pain Descriptors / Indicators  Aching    Pain Type  Chronic pain    Pain Frequency  Intermittent  [place] Adult PT Treatment/Exercise - 01/04/20 0001      Lumbar Exercises: Supine   Bridge Limitations  pelvic tilt with small lift    Large Ball Abdominal Isometric Limitations  overhead and back; press against thighs    Other Supine Lumbar Exercises  all exercises done on wedge pillow with elevated pelvis    Other Supine Lumbar Exercises  UE green band - diagonals and horizontal abduction               PT Short Term Goals - 11/16/19 1035      PT SHORT TERM GOAL #2   Title  pt will report 30% less leakage    Status  Achieved        PT Long Term Goals - 11/16/19 [id number]      PT LONG TERM GOAL #1   Title  Pt will be able to reduce leakage and use only 1-2 pads/day    Baseline  same amount of pads    Time  12    Period  Weeks    Status  On-going    Target Date  02/08/20      PT LONG TERM GOAL #2   Title  Pt will report no nocturia    Baseline  leakage at night, but not a lot and I can't  tell that I have to go    Time  12    Period  Weeks    Status  On-going    Target Date  02/08/20      PT LONG TERM GOAL #3   Title  Pt will report she can feel the urge to urinate at least 90% of the time due to improved health of pelvic floor muscles    Time  12    Period  Weeks    Status  On-going    Target Date  02/08/20      PT LONG TERM GOAL #4   Title  Pt will report at least 60% less leakage due to improved pelvic strength    Baseline  90% better (then states that she is talking about when she can feel the urge)    Time  12    Period  Weeks    Status  On-going    Target Date  02/08/20      PT LONG TERM GOAL #5   Title  Pt will be able to exercise consistently for at least 20 minutes 4-5 days/week due to improved function and energy levels    Baseline  falls asleep throughout the day and feels no energy    Time  12    Period  Weeks    Status  New    Target Date  02/08/20            Plan - 01/04/20 1002    Clinical Impression Statement  Pt did core and pelvic strengthening in supine on wedge pillow for decreased gravity forces down onto pelvic floor.  Pt felt less gurgling in her stomach in this position and was able to perform core and pelvic strengthening exercises throughout the treatment.  Pt could tell she was engaging the pelvic floor    PT Treatment/Interventions  ADLs/Self Care Home Management;Biofeedback;Electrical Stimulation;Cryotherapy;Moist Heat;Functional mobility training;Therapeutic activities;Therapeutic exercise;Patient/family education;Neuromuscular re-education;Taping;Dry needling;Manual techniques    PT Next Visit Plan  continue with progressing core and pelvic floor strength as tolerated; small lunges, hip and core strength progress as tolerated; gluteal strength;    PT Home Exercise Plan  Access Code: [email or web address] and Agree with Plan of Care  Patient       Patient will benefit from skilled therapeutic intervention in order to improve  the following deficits and impairments:  Increased muscle spasms, Impaired tone, Decreased range of motion, Decreased strength, Increased fascial restricitons, Impaired flexibility, Postural dysfunction, Pain  Visit Diagnosis: Unspecified lack of coordination  Muscle weakness (generalized)     Problem List Patient Active Problem List   Diagnosis Date Noted  . Anal cancer ([place]) 06/13/2019  . Hematuria, gross 03/11/2017  . Status post total shoulder arthroplasty, right 09/12/2016  . Arthritis of knee 02/27/2015  . Primary osteoarthritis of right knee Valgus 02/24/2015  . Neuropathy 09/07/2013  . Breast cancer of upper-outer quadrant of left female breast ([place]) 08/09/2013  . Breast cancer of upper-outer quadrant of right female breast ([place]) 08/09/2013  . Osteoarthritis of right hip 05/03/2013  . Osteoarthritis of left hip 02/15/2013    Jule Ser, PT 01/04/2020, 10:19 AM  [facility] [address] [place], [place], [address] Phone: [phone]   Fax:  [phone]  Name: Antonya Carmer MRN: [id number] Date of Birth: 02/04/1951

## 2020-01-05 DIAGNOSIS — Z1159 Encounter for screening for other viral diseases: Secondary | ICD-10-CM | POA: Diagnosis not present

## 2020-01-10 DIAGNOSIS — K6289 Other specified diseases of anus and rectum: Secondary | ICD-10-CM | POA: Diagnosis not present

## 2020-01-10 DIAGNOSIS — K5520 Angiodysplasia of colon without hemorrhage: Secondary | ICD-10-CM | POA: Diagnosis not present

## 2020-01-11 ENCOUNTER — Other Ambulatory Visit: Payer: Self-pay

## 2020-01-11 ENCOUNTER — Ambulatory Visit: Payer: 59 | Admitting: Physical Therapy

## 2020-01-11 ENCOUNTER — Encounter: Payer: Self-pay | Admitting: Physical Therapy

## 2020-01-11 DIAGNOSIS — M6281 Muscle weakness (generalized): Secondary | ICD-10-CM | POA: Diagnosis not present

## 2020-01-11 DIAGNOSIS — R279 Unspecified lack of coordination: Secondary | ICD-10-CM

## 2020-01-11 NOTE — Therapy (Signed)
[facility] [address] [place], [place], [address] Phone: [phone]   Fax:  [phone]  Physical Therapy Treatment  Patient Details  Name: Cynthia Velasquez MRN: [id number] Date of Birth: [birth year] Referring Provider (PT): Truitt Merle, MD   Encounter Date: 01/11/2020  PT End of Session - 01/11/20 1015    Visit Number  18    Date for PT Re-Evaluation  02/08/20    Authorization Type  medicare - needs KX!!    PT Start Time  [phone]   arrived late   PT Stop Time  1015    PT Time Calculation (min)  33 min    Activity Tolerance  Patient tolerated treatment well    Behavior During Therapy  WFL for tasks assessed/performed       Past Medical History:  Diagnosis Date  . Anxiety   . Arthritis   . Bilateral breast cancer ([place]) 10/21/2013  . Breast cancer ([place])   . Cancer ([place])   . Depression   . Gallstones   . GERD (gastroesophageal reflux disease)    doesn't take any meds for this  . Headache    h/o migraines      . History of bladder infections   . History of hiatal hernia   . History of migraine   . Hyperlipidemia    takes Simvastatin daily  . Hypertension    takes Hyzaar  . Joint pain   . Joint swelling   . Lumbar stenosis   . Neuropathy 09/07/2013  . Pneumonia   . Pre-diabetes     Past Surgical History:  Procedure Laterality Date  . ABDOMINAL HYSTERECTOMY    . bone spur removed from left foot    . CHOLECYSTECTOMY    . COLONOSCOPY    . double mastectomy     . HERNIA REPAIR     umbilical  . right knee arthroscopy    . right shoulder arthroscopy    . surgery for hiatal hernia    . TONSILLECTOMY    . TOTAL HIP ARTHROPLASTY Left 02/12/2013  . TOTAL HIP ARTHROPLASTY Left 02/12/2013   Procedure: LEFT TOTAL HIP ARTHROPLASTY;  Surgeon: Kerin Salen, MD;  Location: [place];  Service: Orthopedics;  Laterality: Left;  . TOTAL HIP ARTHROPLASTY Right 05/03/2013   Procedure: TOTAL HIP ARTHROPLASTY;  Surgeon:  Kerin Salen, MD;  Location: [place];  Service: Orthopedics;  Laterality: Right;  . TOTAL KNEE ARTHROPLASTY Right 02/27/2015   Procedure: TOTAL KNEE ARTHROPLASTY;  Surgeon: Frederik Pear, MD;  Location: [place];  Service: Orthopedics;  Laterality: Right;  . TOTAL SHOULDER ARTHROPLASTY Right 09/12/2016   Procedure: RIGHT TOTAL SHOULDER ARTHROPLASTY;  Surgeon: Tania Ade, MD;  Location: [place];  Service: Orthopedics;  Laterality: Right;  RIGHT TOTAL SHOULDER ARTHROPLASTY    There were no vitals filed for this visit.  Subjective Assessment - 01/11/20 0946    Subjective  I had a sigmoidoscopy and no cancer, but have some areas that are getting irritated and sometimes bleeding. Pt also states she got cream for the anus and it has lidocain for pain relief.  Pt is also having a tough time with financial and job releated issues so she    Patient Stated Goals  leakage, energy levels, being able to sleep at night    Currently in Pain?  Yes    Pain Score  5     Pain Location  Rectum    Pain Orientation  Mid  Pain Descriptors / Indicators  Discomfort    Pain Frequency  Intermittent    Aggravating Factors   having a BM    Multiple Pain Sites  No                       [facility] Adult PT Treatment/Exercise - 01/11/20 0001      Manual Therapy   Myofascial Release  abdominal fascial release central to rectum and sigmoid, rectus abdominus, diaphragm - very TTP Lt lateral rectus abdominus               PT Short Term Goals - 11/16/19 1035      PT SHORT TERM GOAL #2   Title  pt will report 30% less leakage    Status  Achieved        PT Long Term Goals - 11/16/19 [id number]      PT LONG TERM GOAL #1   Title  Pt will be able to reduce leakage and use only 1-2 pads/day    Baseline  same amount of pads    Time  12    Period  Weeks    Status  On-going    Target Date  02/08/20      PT LONG TERM GOAL #2   Title  Pt will report no nocturia    Baseline  leakage at night, but not a  lot and I can't tell that I have to go    Time  12    Period  Weeks    Status  On-going    Target Date  02/08/20      PT LONG TERM GOAL #3   Title  Pt will report she can feel the urge to urinate at least 90% of the time due to improved health of pelvic floor muscles    Time  12    Period  Weeks    Status  On-going    Target Date  02/08/20      PT LONG TERM GOAL #4   Title  Pt will report at least 60% less leakage due to improved pelvic strength    Baseline  90% better (then states that she is talking about when she can feel the urge)    Time  12    Period  Weeks    Status  On-going    Target Date  02/08/20      PT LONG TERM GOAL #5   Title  Pt will be able to exercise consistently for at least 20 minutes 4-5 days/week due to improved function and energy levels    Baseline  falls asleep throughout the day and feels no energy    Time  12    Period  Weeks    Status  New    Target Date  02/08/20            Plan - 01/11/20 1021    Clinical Impression Statement  Pt was in pain from exam last week and from having a BM this morning.  Pt is still having leakage and is progressing slowly due to very high levels of pain.  Pt responded well to myofascial release.  she has increased ribcage excursion and tension in rectus abdominus muscles.  Pt will benefit from skilled PT to continue to work on core strength.    PT Treatment/Interventions  ADLs/Self Care Home Management;Biofeedback;Electrical Stimulation;Cryotherapy;Moist Heat;Functional mobility training;Therapeutic activities;Therapeutic exercise;Patient/family education;Neuromuscular re-education;Taping;Dry needling;Manual techniques    PT Next Visit Plan  focus on core strength if able to next    PT Home Exercise Plan  Access Code: [email or web address] and Agree with Plan of Care  Patient       Patient will benefit from skilled therapeutic intervention in order to improve the following deficits and impairments:  Increased  muscle spasms, Impaired tone, Decreased range of motion, Decreased strength, Increased fascial restricitons, Impaired flexibility, Postural dysfunction, Pain  Visit Diagnosis: Unspecified lack of coordination  Muscle weakness (generalized)     Problem List Patient Active Problem List   Diagnosis Date Noted  . Anal cancer ([place]) 06/13/2019  . Hematuria, gross 03/11/2017  . Status post total shoulder arthroplasty, right 09/12/2016  . Arthritis of knee 02/27/2015  . Primary osteoarthritis of right knee Valgus 02/24/2015  . Neuropathy 09/07/2013  . Breast cancer of upper-outer quadrant of left female breast ([place]) 08/09/2013  . Breast cancer of upper-outer quadrant of right female breast ([place]) 08/09/2013  . Osteoarthritis of right hip 05/03/2013  . Osteoarthritis of left hip 02/15/2013    Jule Ser, PT 01/11/2020, 10:39 AM  [facility] [address] [place], [place], [address] Phone: [phone]   Fax:  [phone]  Name: Telsa Nagasawa MRN: [id number] Date of Birth: 02-Nov-1950

## 2020-01-14 ENCOUNTER — Other Ambulatory Visit: Payer: Self-pay

## 2020-01-14 ENCOUNTER — Ambulatory Visit: Payer: 59 | Admitting: Physical Therapy

## 2020-01-14 ENCOUNTER — Encounter: Payer: Self-pay | Admitting: Physical Therapy

## 2020-01-14 DIAGNOSIS — R279 Unspecified lack of coordination: Secondary | ICD-10-CM

## 2020-01-14 DIAGNOSIS — M6281 Muscle weakness (generalized): Secondary | ICD-10-CM

## 2020-01-14 NOTE — Therapy (Signed)
[facility] [address] [place], [place], [address] Phone: [phone]   Fax:  [phone]  Physical Therapy Treatment  Patient Details  Name: Cynthia Velasquez MRN: [id number] Date of Birth: February 19, 1951 Referring Provider (PT): Truitt Merle, MD   Encounter Date: 01/14/2020  PT End of Session - 01/14/20 0947    Visit Number  19    Date for PT Re-Evaluation  02/08/20    Authorization Type  medicare - needs KX!!    PT Start Time  [phone]    PT Stop Time  1011    PT Time Calculation (min)  40 min    Activity Tolerance  Patient tolerated treatment well    Behavior During Therapy  WFL for tasks assessed/performed       Past Medical History:  Diagnosis Date  . Anxiety   . Arthritis   . Bilateral breast cancer ([place]) 10/21/2013  . Breast cancer ([place])   . Cancer ([place])   . Depression   . Gallstones   . GERD (gastroesophageal reflux disease)    doesn't take any meds for this  . Headache    h/o migraines      . History of bladder infections   . History of hiatal hernia   . History of migraine   . Hyperlipidemia    takes Simvastatin daily  . Hypertension    takes Hyzaar  . Joint pain   . Joint swelling   . Lumbar stenosis   . Neuropathy 09/07/2013  . Pneumonia   . Pre-diabetes     Past Surgical History:  Procedure Laterality Date  . ABDOMINAL HYSTERECTOMY    . bone spur removed from left foot    . CHOLECYSTECTOMY    . COLONOSCOPY    . double mastectomy     . HERNIA REPAIR     umbilical  . right knee arthroscopy    . right shoulder arthroscopy    . surgery for hiatal hernia    . TONSILLECTOMY    . TOTAL HIP ARTHROPLASTY Left 02/12/2013  . TOTAL HIP ARTHROPLASTY Left 02/12/2013   Procedure: LEFT TOTAL HIP ARTHROPLASTY;  Surgeon: Kerin Salen, MD;  Location: [place];  Service: Orthopedics;  Laterality: Left;  . TOTAL HIP ARTHROPLASTY Right 05/03/2013   Procedure: TOTAL HIP ARTHROPLASTY;  Surgeon: Kerin Salen, MD;   Location: [place];  Service: Orthopedics;  Laterality: Right;  . TOTAL KNEE ARTHROPLASTY Right 02/27/2015   Procedure: TOTAL KNEE ARTHROPLASTY;  Surgeon: Frederik Pear, MD;  Location: [place];  Service: Orthopedics;  Laterality: Right;  . TOTAL SHOULDER ARTHROPLASTY Right 09/12/2016   Procedure: RIGHT TOTAL SHOULDER ARTHROPLASTY;  Surgeon: Tania Ade, MD;  Location: [place];  Service: Orthopedics;  Laterality: Right;  RIGHT TOTAL SHOULDER ARTHROPLASTY    There were no vitals filed for this visit.  Subjective Assessment - 01/14/20 1100    Subjective  Reports stool is pencil thin and she not sure why.  Using medicine for BM.  No mention of pain today    Currently in Pain?  No/denies                       [facility] Adult PT Treatment/Exercise - 01/14/20 0001      Self-Care   Other Self-Care Comments   thoroughly reviewed toileting and breating with toleting techniques      Lumbar Exercises: Standing   Other Standing Lumbar Exercises  slider fwd/back      Lumbar  Exercises: Supine   Large Ball Abdominal Isometric Limitations  LE roll out ball    Other Supine Lumbar Exercises  thoracic ext and rotation in supine; lumbar rotation             PT Education - 01/14/20 1012    Education Details  toileting techniques    Person(s) Educated  Patient    Methods  Explanation;Demonstration;Verbal cues;Handout    Comprehension  Verbalized understanding;Returned demonstration       PT Short Term Goals - 11/16/19 1035      PT SHORT TERM GOAL #2   Title  pt will report 30% less leakage    Status  Achieved        PT Long Term Goals - 11/16/19 [id number]      PT LONG TERM GOAL #1   Title  Pt will be able to reduce leakage and use only 1-2 pads/day    Baseline  same amount of pads    Time  12    Period  Weeks    Status  On-going    Target Date  02/08/20      PT LONG TERM GOAL #2   Title  Pt will report no nocturia    Baseline  leakage at night, but not a lot and I can't tell  that I have to go    Time  12    Period  Weeks    Status  On-going    Target Date  02/08/20      PT LONG TERM GOAL #3   Title  Pt will report she can feel the urge to urinate at least 90% of the time due to improved health of pelvic floor muscles    Time  12    Period  Weeks    Status  On-going    Target Date  02/08/20      PT LONG TERM GOAL #4   Title  Pt will report at least 60% less leakage due to improved pelvic strength    Baseline  90% better (then states that she is talking about when she can feel the urge)    Time  12    Period  Weeks    Status  On-going    Target Date  02/08/20      PT LONG TERM GOAL #5   Title  Pt will be able to exercise consistently for at least 20 minutes 4-5 days/week due to improved function and energy levels    Baseline  falls asleep throughout the day and feels no energy    Time  12    Period  Weeks    Status  New    Target Date  02/08/20            Plan - 01/14/20 1012    Clinical Impression Statement  Reviewed toileting techniques again today and patient had improved ability to feel what she was doing with her pelvic floor muscles.  Pt continues to work on core strength today.  She was able to do all exercises without increased pain.  Felt good stretch with towel roll for thoracic extension.  Pt will benefit from skilled PT to continue to work on core strength and muscle coordination.    PT Treatment/Interventions  ADLs/Self Care Home Management;Biofeedback;Electrical Stimulation;Cryotherapy;Moist Heat;Functional mobility training;Therapeutic activities;Therapeutic exercise;Patient/family education;Neuromuscular re-education;Taping;Dry needling;Manual techniques    PT Next Visit Plan  focus on core strength, breathing and LE strength    PT Home Exercise Plan  Access  Code: [email or web address]    Consulted and Agree with Plan of Care  Patient       Patient will benefit from skilled therapeutic intervention in order to improve the following deficits  and impairments:  Increased muscle spasms, Impaired tone, Decreased range of motion, Decreased strength, Increased fascial restricitons, Impaired flexibility, Postural dysfunction, Pain  Visit Diagnosis: Unspecified lack of coordination  Muscle weakness (generalized)     Problem List Patient Active Problem List   Diagnosis Date Noted  . Anal cancer ([place]) 06/13/2019  . Hematuria, gross 03/11/2017  . Status post total shoulder arthroplasty, right 09/12/2016  . Arthritis of knee 02/27/2015  . Primary osteoarthritis of right knee Valgus 02/24/2015  . Neuropathy 09/07/2013  . Breast cancer of upper-outer quadrant of left female breast ([place]) 08/09/2013  . Breast cancer of upper-outer quadrant of right female breast ([place]) 08/09/2013  . Osteoarthritis of right hip 05/03/2013  . Osteoarthritis of left hip 02/15/2013    Jule Ser, PT 01/14/2020, 11:01 AM  [facility] [address] [place], [place], [address] Phone: [phone]   Fax:  [phone]  Name: Cynthia Velasquez MRN: [id number] Date of Birth: [birth year]

## 2020-01-14 NOTE — Patient Instructions (Addendum)
Breathing during bowel movement:  1. Back straight - sit up low back to upper back not rounding forward - look straight ahead 2. Lean forward - only as far as possible with back staying straight 3. Breathe - slow as you can inhaling down into your belly and feeling pressure on the pelvic floor 4. Hard belly - keep belly like a hard ball on the max inhale 5. Blow hard - like blowing up a balloon and pressure down into pelvic floor 6. Squeeze and lift - tighten pelvic floor after BM to reset everything back into place  [facility] [address], [address] [place], [place] [address] Phone # [phone] Fax [phone]  

## 2020-01-18 ENCOUNTER — Encounter: Payer: Self-pay | Admitting: Physical Therapy

## 2020-01-18 ENCOUNTER — Ambulatory Visit: Payer: 59 | Attending: Hematology | Admitting: Physical Therapy

## 2020-01-18 ENCOUNTER — Other Ambulatory Visit: Payer: Self-pay

## 2020-01-18 DIAGNOSIS — M6281 Muscle weakness (generalized): Secondary | ICD-10-CM | POA: Diagnosis not present

## 2020-01-18 DIAGNOSIS — R279 Unspecified lack of coordination: Secondary | ICD-10-CM

## 2020-01-18 NOTE — Therapy (Signed)
[facility] [address], [place], [place], [address] Phone: [phone]   Fax:  [phone]  Physical Therapy Treatment Progress Note Reporting Period 11/30/19 to 01/18/20   See note below for Objective Data and Assessment of Progress/Goals.      Patient Details  Name: Cynthia Velasquez MRN: [id number] Date of Birth: Jul 10, 1951 Referring Provider (PT): Truitt Merle, MD   Encounter Date: 01/18/2020  PT End of Session - 01/18/20 1455    Visit Number  20    Date for PT Re-Evaluation  02/08/20    Authorization Type  medicare - needs KX!!    PT Start Time  1450    PT Stop Time  1530    PT Time Calculation (min)  40 min    Activity Tolerance  Patient tolerated treatment well    Behavior During Therapy  WFL for tasks assessed/performed       Past Medical History:  Diagnosis Date  . Anxiety   . Arthritis   . Bilateral breast cancer ([place]) 10/21/2013  . Breast cancer ([place])   . Cancer ([place])   . Depression   . Gallstones   . GERD (gastroesophageal reflux disease)    doesn't take any meds for this  . Headache    h/o migraines      . History of bladder infections   . History of hiatal hernia   . History of migraine   . Hyperlipidemia    takes Simvastatin daily  . Hypertension    takes Hyzaar  . Joint pain   . Joint swelling   . Lumbar stenosis   . Neuropathy 09/07/2013  . Pneumonia   . Pre-diabetes     Past Surgical History:  Procedure Laterality Date  . ABDOMINAL HYSTERECTOMY    . bone spur removed from left foot    . CHOLECYSTECTOMY    . COLONOSCOPY    . double mastectomy     . HERNIA REPAIR     umbilical  . right knee arthroscopy    . right shoulder arthroscopy    . surgery for hiatal hernia    . TONSILLECTOMY    . TOTAL HIP ARTHROPLASTY Left 02/12/2013  . TOTAL HIP ARTHROPLASTY Left 02/12/2013   Procedure: LEFT TOTAL HIP ARTHROPLASTY;  Surgeon: Kerin Salen, MD;  Location: [place];  Service:  Orthopedics;  Laterality: Left;  . TOTAL HIP ARTHROPLASTY Right 05/03/2013   Procedure: TOTAL HIP ARTHROPLASTY;  Surgeon: Kerin Salen, MD;  Location: [place];  Service: Orthopedics;  Laterality: Right;  . TOTAL KNEE ARTHROPLASTY Right 02/27/2015   Procedure: TOTAL KNEE ARTHROPLASTY;  Surgeon: Frederik Pear, MD;  Location: [place];  Service: Orthopedics;  Laterality: Right;  . TOTAL SHOULDER ARTHROPLASTY Right 09/12/2016   Procedure: RIGHT TOTAL SHOULDER ARTHROPLASTY;  Surgeon: Tania Ade, MD;  Location: [place];  Service: Orthopedics;  Laterality: Right;  RIGHT TOTAL SHOULDER ARTHROPLASTY    There were no vitals filed for this visit.  Subjective Assessment - 01/18/20 1502    Subjective  Pt states her back pain has worsened.  Pt states that has been what she is struggling with more this week.  Pt report her pain is 9.99/10.    Limitations  Standing;Sitting    Currently in Pain?  Yes    Pain Score  9     Pain Location  Back    Pain Orientation  Lower;Mid    Pain Descriptors / Indicators  Nagging;Sharp    Pain Type  Chronic pain    Pain Onset  More than a month ago    Pain Frequency  Constant    Aggravating Factors   standing too long, sitting and then standing after sitting too long    Multiple Pain Sites  No                       [facility] Adult PT Treatment/Exercise - 01/18/20 0001      Lumbar Exercises: Stretches   Active Hamstring Stretch  Right;Left;3 reps;20 seconds    Gastroc Stretch  Right;Left;2 reps;20 seconds      Lumbar Exercises: Aerobic   UBE (Upper Arm Bike)  L1 x 10 min - cues to activate core and PT present for status update      Lumbar Exercises: Standing   Other Standing Lumbar Exercises  toe tap step 20x; hip abduction 10x each side      Lumbar Exercises: Seated   Long Arc Quad on Chair  Strengthening;Both;2 sets;10 reps    Hip Flexion on Ball  Strengthening;Right;Left;20 reps   2lb              PT Short Term Goals - 11/16/19 1035       PT SHORT TERM GOAL #2   Title  pt will report 30% less leakage    Status  Achieved        PT Long Term Goals - 01/18/20 1505      PT LONG TERM GOAL #1   Title  Pt will be able to reduce leakage and use only 1-2 pads/day    Baseline  4/day    Status  On-going      PT LONG TERM GOAL #2   Title  Pt will report no nocturia    Baseline  no nocturia recently    Status  Partially Met      PT LONG TERM GOAL #3   Title  Pt will report she can feel the urge to urinate at least 90% of the time due to improved health of pelvic floor muscles    Baseline  can tell I have to go 50% of the time    Status  Partially Met      PT LONG TERM GOAL #4   Title  Pt will report at least 60% less leakage due to improved pelvic strength    Baseline  90% better (then states that she is talking about when she can feel the urge)    Status  Partially Met      PT LONG TERM GOAL #5   Title  Pt will be able to exercise consistently for at least 20 minutes 4-5 days/week due to improved function and energy levels    Baseline  I am doing the exercises 3 days/week at home and 2/weeks come here    Status  Partially Met            Plan - 01/18/20 1643    Clinical Impression Statement  Pt was having increased low back pain today.  Pt feels better with lumbar flexion.  She was educated in performing kegel when going into and out of lumbar flexion for eccentric and concentric strengthening.  No increased pain during treatment today.  Pt is making slow and steady progress towards goals and will benefit from skilled PT to continue to work towards functional goals and pain management.  pt has complicated medical history with cancer and chronic low back pain that is justification  of slow progress    Comorbidities  chemo, radiation, hx of breast cancer, chronic low back pain    PT Treatment/Interventions  ADLs/Self Care Home Management;Biofeedback;Electrical Stimulation;Cryotherapy;Moist Heat;Functional mobility  training;Therapeutic activities;Therapeutic exercise;Patient/family education;Neuromuscular re-education;Taping;Dry needling;Manual techniques    PT Next Visit Plan  focus on core strength, breathing and LE strength, hip hinge with kegel    PT Home Exercise Plan  Access Code: [email or web address]    Consulted and Agree with Plan of Care  Patient       Patient will benefit from skilled therapeutic intervention in order to improve the following deficits and impairments:  Increased muscle spasms, Impaired tone, Decreased range of motion, Decreased strength, Increased fascial restricitons, Impaired flexibility, Postural dysfunction, Pain  Visit Diagnosis: Unspecified lack of coordination  Muscle weakness (generalized)     Problem List Patient Active Problem List   Diagnosis Date Noted  . Anal cancer ([place]) 06/13/2019  . Hematuria, gross 03/11/2017  . Status post total shoulder arthroplasty, right 09/12/2016  . Arthritis of knee 02/27/2015  . Primary osteoarthritis of right knee Valgus 02/24/2015  . Neuropathy 09/07/2013  . Breast cancer of upper-outer quadrant of left female breast ([place]) 08/09/2013  . Breast cancer of upper-outer quadrant of right female breast ([place]) 08/09/2013  . Osteoarthritis of right hip 05/03/2013  . Osteoarthritis of left hip 02/15/2013    Jule Ser, PT 01/18/2020, 4:46 PM  [facility] [address] [place], [place], [address] Phone: [phone]   Fax:  [phone]  Name: Cynthia Velasquez MRN: [id number] Date of Birth: [birth year]

## 2020-01-19 DIAGNOSIS — M47816 Spondylosis without myelopathy or radiculopathy, lumbar region: Secondary | ICD-10-CM | POA: Diagnosis not present

## 2020-01-25 ENCOUNTER — Other Ambulatory Visit: Payer: Self-pay

## 2020-01-25 ENCOUNTER — Ambulatory Visit: Payer: 59 | Admitting: Physical Therapy

## 2020-01-25 ENCOUNTER — Encounter: Payer: Self-pay | Admitting: Physical Therapy

## 2020-01-25 DIAGNOSIS — R279 Unspecified lack of coordination: Secondary | ICD-10-CM | POA: Diagnosis not present

## 2020-01-25 DIAGNOSIS — M6281 Muscle weakness (generalized): Secondary | ICD-10-CM

## 2020-01-25 NOTE — Patient Instructions (Signed)
[id number]

## 2020-01-25 NOTE — Therapy (Signed)
[facility] [address] [place], [place], [address] Phone: [phone]   Fax:  [phone]  Physical Therapy Treatment  Patient Details  Name: Cynthia Velasquez MRN: [id number] Date of Birth: Mar 08, 1951 Referring Provider (PT): Truitt Merle, MD   Encounter Date: 01/25/2020  PT End of Session - 01/25/20 0816    Visit Number  21    Date for PT Re-Evaluation  02/08/20    Authorization Type  medicare - needs KX!!    PT Start Time  0803    PT Stop Time  [phone]    PT Time Calculation (min)  39 min    Activity Tolerance  Patient tolerated treatment well    Behavior During Therapy  [facility] for tasks assessed/performed       Past Medical History:  Diagnosis Date  . Anxiety   . Arthritis   . Bilateral breast cancer ([place]) 10/21/2013  . Breast cancer ([place])   . Cancer ([place])   . Depression   . Gallstones   . GERD (gastroesophageal reflux disease)    doesn't take any meds for this  . Headache    h/o migraines      . History of bladder infections   . History of hiatal hernia   . History of migraine   . Hyperlipidemia    takes Simvastatin daily  . Hypertension    takes Hyzaar  . Joint pain   . Joint swelling   . Lumbar stenosis   . Neuropathy 09/07/2013  . Pneumonia   . Pre-diabetes     Past Surgical History:  Procedure Laterality Date  . ABDOMINAL HYSTERECTOMY    . bone spur removed from left foot    . CHOLECYSTECTOMY    . COLONOSCOPY    . double mastectomy     . HERNIA REPAIR     umbilical  . right knee arthroscopy    . right shoulder arthroscopy    . surgery for hiatal hernia    . TONSILLECTOMY    . TOTAL HIP ARTHROPLASTY Left 02/12/2013  . TOTAL HIP ARTHROPLASTY Left 02/12/2013   Procedure: LEFT TOTAL HIP ARTHROPLASTY;  Surgeon: Kerin Salen, MD;  Location: [place];  Service: Orthopedics;  Laterality: Left;  . TOTAL HIP ARTHROPLASTY Right 05/03/2013   Procedure: TOTAL HIP ARTHROPLASTY;  Surgeon: Kerin Salen, MD;   Location: [place];  Service: Orthopedics;  Laterality: Right;  . TOTAL KNEE ARTHROPLASTY Right 02/27/2015   Procedure: TOTAL KNEE ARTHROPLASTY;  Surgeon: Frederik Pear, MD;  Location: [place];  Service: Orthopedics;  Laterality: Right;  . TOTAL SHOULDER ARTHROPLASTY Right 09/12/2016   Procedure: RIGHT TOTAL SHOULDER ARTHROPLASTY;  Surgeon: Tania Ade, MD;  Location: [facility];  Service: Orthopedics;  Laterality: Right;  RIGHT TOTAL SHOULDER ARTHROPLASTY    There were no vitals filed for this visit.  Subjective Assessment - 01/25/20 0804    Subjective  Pt states she had a couple of accidents this weekend.  Pt got an injection in her low back but states the pain is now going down her Lt leg.    Currently in Pain?  Yes    Pain Score  6     Pain Location  Back    Pain Orientation  Left    Pain Descriptors / Indicators  Radiating    Pain Type  Chronic pain    Pain Radiating Towards  back down into buttock down the left leg    Pain Onset  More than a month  ago    Pain Frequency  Constant    Multiple Pain Sites  No                       [facility] Adult PT Treatment/Exercise - 01/25/20 0001      Neuro Re-ed    Neuro Re-ed Details   tactile cues to pelvic floor to ensure correct technique and guide pt on how long she can hold      Lumbar Exercises: Sidelying   Other Sidelying Lumbar Exercises  ball squeeze with quick flick and contract and hold 7 sec; 82N 3 quick flicks; 3 x 7 sec    Other Sidelying Lumbar Exercises  same as above in supine with ball squeeze      Manual Therapy   Soft tissue mobilization  Lt gluteals       Trigger Point Dry Needling - 01/25/20 0001    Consent Given?  Yes    Education Handout Provided  Yes    Muscles Treated Back/Hip  Gluteus medius;Gluteus minimus;Piriformis    Gluteus Minimus Response  Palpable increased muscle length    Gluteus Medius Response  Palpable increased muscle length    Piriformis Response  Palpable increased muscle length;Twitch  response elicited           PT Education - 01/25/20 0848    Education Details  [id number]    Person(s) Educated  Patient    Methods  Explanation;Demonstration;Handout;Verbal cues;Tactile cues    Comprehension  Verbalized understanding;Returned demonstration       PT Short Term Goals - 11/16/19 1035      PT SHORT TERM GOAL #2   Title  pt will report 30% less leakage    Status  Achieved        PT Long Term Goals - 01/18/20 1505      PT LONG TERM GOAL #1   Title  Pt will be able to reduce leakage and use only 1-2 pads/day    Baseline  4/day    Status  On-going      PT LONG TERM GOAL #2   Title  Pt will report no nocturia    Baseline  no nocturia recently    Status  Partially Met      PT LONG TERM GOAL #3   Title  Pt will report she can feel the urge to urinate at least 90% of the time due to improved health of pelvic floor muscles    Baseline  can tell I have to go 50% of the time    Status  Partially Met      PT LONG TERM GOAL #4   Title  Pt will report at least 60% less leakage due to improved pelvic strength    Baseline  90% better (then states that she is talking about when she can feel the urge)    Status  Partially Met      PT LONG TERM GOAL #5   Title  Pt will be able to exercise consistently for at least 20 minutes 4-5 days/week due to improved function and energy levels    Baseline  I am doing the exercises 3 days/week at home and 2/weeks come here    Status  Partially Met            Plan - 01/25/20 0841    Clinical Impression Statement  Pt had decent response to dry needling without increased pain but unsure if pain was decreased at this  time. Today's session we reviewed sit to stand with exhale and pt given exercises for contract relax quick flicks.  Tactile cues and vebal cues used to ensure pt doing these correctly.  She continue to have decreased sensation and will need skilled PT to work on improved strength and coordination    PT  Treatment/Interventions  ADLs/Self Care Home Management;Biofeedback;Electrical Stimulation;Cryotherapy;Moist Heat;Functional mobility training;Therapeutic activities;Therapeutic exercise;Patient/family education;Neuromuscular re-education;Taping;Dry needling;Manual techniques    PT Next Visit Plan  f/u on quick flicks and endurance; focus on core strength, breathing and LE strength, hip hinge with kegel    PT Home Exercise Plan  Access Code: [email or web address]    Consulted and Agree with Plan of Care  Patient       Patient will benefit from skilled therapeutic intervention in order to improve the following deficits and impairments:  Increased muscle spasms, Impaired tone, Decreased range of motion, Decreased strength, Increased fascial restricitons, Impaired flexibility, Postural dysfunction, Pain  Visit Diagnosis: Unspecified lack of coordination  Muscle weakness (generalized)     Problem List Patient Active Problem List   Diagnosis Date Noted  . Anal cancer ([place]) 06/13/2019  . Hematuria, gross 03/11/2017  . Status post total shoulder arthroplasty, right 09/12/2016  . Arthritis of knee 02/27/2015  . Primary osteoarthritis of right knee Valgus 02/24/2015  . Neuropathy 09/07/2013  . Breast cancer of upper-outer quadrant of left female breast ([place]) 08/09/2013  . Breast cancer of upper-outer quadrant of right female breast ([place]) 08/09/2013  . Osteoarthritis of right hip 05/03/2013  . Osteoarthritis of left hip 02/15/2013    Jule Ser, PT 01/25/2020, 10:18 AM  [facility] [address], [place], [place], [address] Phone: [phone]   Fax:  [phone]  Name: Sury Wentworth MRN: [id number] Date of Birth: [birth year]

## 2020-01-28 ENCOUNTER — Encounter: Payer: Self-pay | Admitting: Physical Therapy

## 2020-01-28 ENCOUNTER — Ambulatory Visit: Payer: 59 | Admitting: Physical Therapy

## 2020-01-28 ENCOUNTER — Other Ambulatory Visit: Payer: Self-pay

## 2020-01-28 DIAGNOSIS — M5416 Radiculopathy, lumbar region: Secondary | ICD-10-CM | POA: Diagnosis not present

## 2020-01-28 DIAGNOSIS — M6281 Muscle weakness (generalized): Secondary | ICD-10-CM

## 2020-01-28 DIAGNOSIS — R279 Unspecified lack of coordination: Secondary | ICD-10-CM | POA: Diagnosis not present

## 2020-01-28 NOTE — Therapy (Signed)
[facility] [address] [place], [place], [address] Phone: [phone]   Fax:  [phone]  Physical Therapy Treatment  Patient Details  Name: Cynthia Velasquez MRN: [id number] Date of Birth: 1951/04/12 Referring Provider (PT): Truitt Merle, MD   Encounter Date: 01/28/2020  PT End of Session - 01/28/20 0939    Visit Number  22    Date for PT Re-Evaluation  02/08/20    Authorization Type  medicare - needs KX!!    PT Start Time  [phone]    PT Stop Time  1013    PT Time Calculation (min)  44 min    Activity Tolerance  Patient tolerated treatment well    Behavior During Therapy  [facility] for tasks assessed/performed       Past Medical History:  Diagnosis Date  . Anxiety   . Arthritis   . Bilateral breast cancer ([place]) 10/21/2013  . Breast cancer ([place])   . Cancer ([place])   . Depression   . Gallstones   . GERD (gastroesophageal reflux disease)    doesn't take any meds for this  . Headache    h/o migraines      . History of bladder infections   . History of hiatal hernia   . History of migraine   . Hyperlipidemia    takes Simvastatin daily  . Hypertension    takes Hyzaar  . Joint pain   . Joint swelling   . Lumbar stenosis   . Neuropathy 09/07/2013  . Pneumonia   . Pre-diabetes     Past Surgical History:  Procedure Laterality Date  . ABDOMINAL HYSTERECTOMY    . bone spur removed from left foot    . CHOLECYSTECTOMY    . COLONOSCOPY    . double mastectomy     . HERNIA REPAIR     umbilical  . right knee arthroscopy    . right shoulder arthroscopy    . surgery for hiatal hernia    . TONSILLECTOMY    . TOTAL HIP ARTHROPLASTY Left 02/12/2013  . TOTAL HIP ARTHROPLASTY Left 02/12/2013   Procedure: LEFT TOTAL HIP ARTHROPLASTY;  Surgeon: Kerin Salen, MD;  Location: [place];  Service: Orthopedics;  Laterality: Left;  . TOTAL HIP ARTHROPLASTY Right 05/03/2013   Procedure: TOTAL HIP ARTHROPLASTY;  Surgeon: Kerin Salen, MD;   Location: [place];  Service: Orthopedics;  Laterality: Right;  . TOTAL KNEE ARTHROPLASTY Right 02/27/2015   Procedure: TOTAL KNEE ARTHROPLASTY;  Surgeon: Frederik Pear, MD;  Location: [facility] [place];  Service: Orthopedics;  Laterality: Right;  . TOTAL SHOULDER ARTHROPLASTY Right 09/12/2016   Procedure: RIGHT TOTAL SHOULDER ARTHROPLASTY;  Surgeon: Tania Ade, MD;  Location: [place];  Service: Orthopedics;  Laterality: Right;  RIGHT TOTAL SHOULDER ARTHROPLASTY    There were no vitals filed for this visit.  Subjective Assessment - 01/28/20 0933    Subjective  Pt states that the ortho could not determine from the x-ray and will have an MRI.    Patient is accompained by:  --   Dr. Pearson Grippe present for obs   Patient Stated Goals  leakage, energy levels, being able to sleep at night    Currently in Pain?  Yes    Pain Score  8     Pain Location  Back    Pain Orientation  Left    Pain Descriptors / Indicators  Radiating;Aching    Pain Type  Acute pain  [place] Adult PT Treatment/Exercise - 01/28/20 0001      Neuro Re-ed    Neuro Re-ed Details   breathing coordination with pelvic lift and core activation - sidelying, sitting, standing in semi-flexed on table and standing upright      Lumbar Exercises: Sidelying   Other Sidelying Lumbar Exercises  ball squeeze sidelying;                PT Short Term Goals - 11/16/19 1035      PT SHORT TERM GOAL #2   Title  pt will report 30% less leakage    Status  Achieved        PT Long Term Goals - 01/18/20 1505      PT LONG TERM GOAL #1   Title  Pt will be able to reduce leakage and use only 1-2 pads/day    Baseline  4/day    Status  On-going      PT LONG TERM GOAL #2   Title  Pt will report no nocturia    Baseline  no nocturia recently    Status  Partially Met      PT LONG TERM GOAL #3   Title  Pt will report she can feel the urge to urinate at least 90% of the time due to improved health of pelvic  floor muscles    Baseline  can tell I have to go 50% of the time    Status  Partially Met      PT LONG TERM GOAL #4   Title  Pt will report at least 60% less leakage due to improved pelvic strength    Baseline  90% better (then states that she is talking about when she can feel the urge)    Status  Partially Met      PT LONG TERM GOAL #5   Title  Pt will be able to exercise consistently for at least 20 minutes 4-5 days/week due to improved function and energy levels    Baseline  I am doing the exercises 3 days/week at home and 2/weeks come here    Status  Partially Met            Plan - 01/28/20 1154    Clinical Impression Statement  Pt is making progress with coordination of breathing and pelvic floor contraction.  Thoroughly went over this technique in several positions.  She needs extra time to focus on the muscle coordination.  Pt was not previously able to correctly coordinate so she is definitely making progress.  She is unable to hold and strength unable to be accurately assessed due to pain.    PT Treatment/Interventions  ADLs/Self Care Home Management;Biofeedback;Electrical Stimulation;Cryotherapy;Moist Heat;Functional mobility training;Therapeutic activities;Therapeutic exercise;Patient/family education;Neuromuscular re-education;Taping;Dry needling;Manual techniques    PT Next Visit Plan  f/u on breathing and leaning on counter and kegel on exhale    PT Home Exercise Plan  Access Code: [email or web address]    Consulted and Agree with Plan of Care  Patient       Patient will benefit from skilled therapeutic intervention in order to improve the following deficits and impairments:  Increased muscle spasms, Impaired tone, Decreased range of motion, Decreased strength, Increased fascial restricitons, Impaired flexibility, Postural dysfunction, Pain  Visit Diagnosis: Unspecified lack of coordination  Muscle weakness (generalized)     Problem List Patient Active Problem List    Diagnosis Date Noted  . Anal cancer ([place]) 06/13/2019  . Hematuria, gross 03/11/2017  . Status post total  shoulder arthroplasty, right 09/12/2016  . Arthritis of knee 02/27/2015  . Primary osteoarthritis of right knee Valgus 02/24/2015  . Neuropathy 09/07/2013  . Breast cancer of upper-outer quadrant of left female breast ([place]) 08/09/2013  . Breast cancer of upper-outer quadrant of right female breast ([place]) 08/09/2013  . Osteoarthritis of right hip 05/03/2013  . Osteoarthritis of left hip 02/15/2013    Jule Ser, PT 01/28/2020, 12:04 PM  [facility] [address] [place], [place], [address] Phone: [phone]   Fax:  [phone]  Name: Cynthia Velasquez MRN: [id number] Date of Birth: 1951/08/28

## 2020-02-05 DIAGNOSIS — M545 Low back pain: Secondary | ICD-10-CM | POA: Diagnosis not present

## 2020-02-07 DIAGNOSIS — M47816 Spondylosis without myelopathy or radiculopathy, lumbar region: Secondary | ICD-10-CM | POA: Diagnosis not present

## 2020-02-08 ENCOUNTER — Ambulatory Visit: Payer: 59 | Admitting: Physical Therapy

## 2020-02-08 ENCOUNTER — Other Ambulatory Visit: Payer: Self-pay

## 2020-02-08 ENCOUNTER — Encounter: Payer: Self-pay | Admitting: Physical Therapy

## 2020-02-08 DIAGNOSIS — R279 Unspecified lack of coordination: Secondary | ICD-10-CM

## 2020-02-08 DIAGNOSIS — M6281 Muscle weakness (generalized): Secondary | ICD-10-CM

## 2020-02-08 NOTE — Patient Instructions (Signed)
Access Code: [email or web address] URL: [email or web address] Date: 12/16/2019 Prepared by: Jari Favre  Exercises Seated Hamstring Stretch - 1 x daily - 7 x weekly - 10 reps - 3 sets Seated Piriformis Stretch - 1 x daily - 7 x weekly - 3 reps - 1 sets - 30 sec hold Supine Figure 4 Piriformis Stretch - 1 x daily - 7 x weekly - 3 reps - 1 sets - 30 sec hold Supine Diaphragmatic Breathing with Pelvic Floor Lengthening - 3 x daily - 7 x weekly - 10 reps - 1 sets Supine Butterfly Groin Stretch - 1 x daily - 7 x weekly - 3 reps - 1 sets - 30 sec hold Supine Pelvic Floor Contraction - 3 x daily - 7 x weekly - 10 reps - 1 sets - 2 sec hold Hooklying Small March - 1 x daily - 7 x weekly - 10 reps - 2 sets Seated Lumbar Flexion Stretch - 1 x daily - 7 x weekly - 10 reps - 1 sets - 10 sec hold Supine Pelvic Floor Contraction - 3 x daily - 7 x weekly - 15 reps - 1 sets - 3 sec hold Seated Pelvic Floor Contraction with Isometric Hip Adduction - 3 x daily - 7 x weekly - 1 sets - 15 reps - 3 sechold, 3 sec rest hold Mini Squat with Pelvic Floor Contraction - 1 x daily - 7 x weekly - 10 reps - 3 sets Step Up with Pelvic Floor Contraction - 1 x daily - 7 x weekly - 10 reps - 3 sets Sidestepping with Pelvic Floor Contraction - 1 x daily - 7 x weekly - 10 reps - 3 sets Supine Hip Adductor Squeeze with Small Ball - 3 x daily - 7 x weekly - 10 reps - 1 sets - 3 sec hold Sidelying Pelvic Floor Contraction with Self-Palpation - 1 x daily - 7 x weekly - 3 sets - 10 reps Supine Posterior Pelvic Tilt - 1 x daily - 7 x weekly - 10 reps - 3 sets

## 2020-02-08 NOTE — Therapy (Signed)
[facility] [address] [place], [place], [address] Phone: [phone]   Fax:  [phone]  Physical Therapy Treatment  Patient Details  Name: Cynthia Velasquez MRN: [id number] Date of Birth: 03-21-1951 Referring Provider (PT): Truitt Merle, MD   Encounter Date: 02/08/2020  PT End of Session - 02/08/20 1014    Visit Number  23    Date for PT Re-Evaluation  03/21/20    Authorization Type  medicare - needs KX!!    PT Start Time  [phone]    PT Stop Time  1015    PT Time Calculation (min)  42 min    Activity Tolerance  Patient tolerated treatment well    Behavior During Therapy  WFL for tasks assessed/performed       Past Medical History:  Diagnosis Date  . Anxiety   . Arthritis   . Bilateral breast cancer ([place]) 10/21/2013  . Breast cancer ([place])   . Cancer ([place])   . Depression   . Gallstones   . GERD (gastroesophageal reflux disease)    doesn't take any meds for this  . Headache    h/o migraines      . History of bladder infections   . History of hiatal hernia   . History of migraine   . Hyperlipidemia    takes Simvastatin daily  . Hypertension    takes Hyzaar  . Joint pain   . Joint swelling   . Lumbar stenosis   . Neuropathy 09/07/2013  . Pneumonia   . Pre-diabetes     Past Surgical History:  Procedure Laterality Date  . ABDOMINAL HYSTERECTOMY    . bone spur removed from left foot    . CHOLECYSTECTOMY    . COLONOSCOPY    . double mastectomy     . HERNIA REPAIR     umbilical  . right knee arthroscopy    . right shoulder arthroscopy    . surgery for hiatal hernia    . TONSILLECTOMY    . TOTAL HIP ARTHROPLASTY Left 02/12/2013  . TOTAL HIP ARTHROPLASTY Left 02/12/2013   Procedure: LEFT TOTAL HIP ARTHROPLASTY;  Surgeon: Kerin Salen, MD;  Location: [facility];  Service: Orthopedics;  Laterality: Left;  . TOTAL HIP ARTHROPLASTY Right 05/03/2013   Procedure: TOTAL HIP ARTHROPLASTY;  Surgeon: Kerin Salen, MD;   Location: [facility];  Service: Orthopedics;  Laterality: Right;  . TOTAL KNEE ARTHROPLASTY Right 02/27/2015   Procedure: TOTAL KNEE ARTHROPLASTY;  Surgeon: Frederik Pear, MD;  Location: [facility];  Service: Orthopedics;  Laterality: Right;  . TOTAL SHOULDER ARTHROPLASTY Right 09/12/2016   Procedure: RIGHT TOTAL SHOULDER ARTHROPLASTY;  Surgeon: Tania Ade, MD;  Location: [facility];  Service: Orthopedics;  Laterality: Right;  RIGHT TOTAL SHOULDER ARTHROPLASTY    There were no vitals filed for this visit.  Subjective Assessment - 02/08/20 0937    Subjective  Pt reports overall feels improved from 1/10 to 7/10 of her bowel function (if 10/10 if 100% improved).  Pt had MRI that showed arthritis and more on the Lt side.  Pt states she slept well last night but the medicine maybe caused bowel accidents.  Pt reports that sitting doesn't hurt her back, but getting up and walking, it will hurt.  Pt feels like she is holding her BM, but she doesn't always notice when it starts.    Limitations  Standing;Sitting    Patient Stated Goals  leakage, energy levels, being able to sleep at night  Currently in Pain?  No/denies         [facility] PT Assessment - 02/08/20 0001      Assessment   Medical Diagnosis  C21.0 (ICD-10-CM) - Anal cancer ([place])    Referring Provider (PT)  Truitt Merle, MD      Strength   Right/Left Hip  Right;Left    Right Hip Flexion  5/5    Right Hip Extension  5/5    Right Hip ABduction  4/5    Left Hip Flexion  4/5    Left Hip Extension  4/5    Left Hip ABduction  4-/5    Left Hip ADduction  4/5    Right/Left Knee  Right;Left    Left Knee Flexion  4/5    Left Knee Extension  4/5      Flexibility   Hamstrings  Lt hamstring 80% and pain with stretch                 Pelvic Floor Special Questions - 02/08/20 0001    Pad use  3-4/day    Strength # of seconds  2        [facility] Adult PT Treatment/Exercise - 02/08/20 0001      Neuro Re-ed    Neuro Re-ed Details   breathing and  coordinating pelvic tilt and pelvic contraction      Lumbar Exercises: Supine   Ab Set  10 reps   tactile cues   Pelvic Tilt  10 reps   tactile cues            PT Education - 02/08/20 1043    Education Details  Access Code: [email or web address]    Person(s) Educated  Patient    Methods  Explanation;Demonstration;Verbal cues;Handout    Comprehension  Verbalized understanding;Returned demonstration       PT Short Term Goals - 11/16/19 1035      PT SHORT TERM GOAL #2   Title  pt will report 30% less leakage    Status  Achieved        PT Long Term Goals - 02/08/20 1251      PT LONG TERM GOAL #1   Title  Pt will be able to reduce leakage and use only 1-2 pads/day    Baseline  4 (down from 6/day)    Time  6    Period  Weeks    Status  New    Target Date  03/21/20      PT LONG TERM GOAL #2   Title  Pt will report no nocturia    Baseline  no nocturia recently    Period  Weeks    Status  On-going    Target Date  03/21/20      PT LONG TERM GOAL #3   Title  Pt will report she can feel the urge to urinate at least 90% of the time due to improved health of pelvic floor muscles    Baseline  can tell I have to go 50% of the time    Time  6    Period  Weeks    Status  Partially Met    Target Date  03/21/20      PT LONG TERM GOAL #4   Title  Pt will report at least 60% less leakage due to improved pelvic strength    Baseline  90% better (then states that she is talking about when she can feel the urge)    Time  6  Period  Weeks    Status  Partially Met    Target Date  03/21/20      PT LONG TERM GOAL #5   Title  Pt will be able to exercise consistently for at least 20 minutes 4-5 days/week due to improved function and energy levels    Baseline  I am doing the exercises 3 days/week at home and 2/weeks come here    Time  6    Period  Weeks    Status  Partially Met    Target Date  03/21/20            Plan - 02/08/20 1242    Clinical Impression Statement  Pt is  making progress with coordination and is slowly making porgress with control of BM.  She was able to prevent full incontinence, but she is still having a difficulty time knowing exactly when the bowel movement starts, so she cannot prevent the initial leakage.  Pt has improved LE strength.  Pt has been also demonstrating more slow porgress due to worsening back pain that has been present throughout the course of her treatment.  At this time, PT feels that patient will definitely benefit from continued therapy.  She is expected to continue to make slow but steady progress and runs the risk of regressing at this time if she does not continue with her care.  Pt is recommended to continue for 6 more weaks.    Personal Factors and Comorbidities  Comorbidity 3+    Comorbidities  chemo, radiation, hx of breast cancer, chronic low back pain    Examination-Activity Limitations  Toileting;Continence    Examination-Participation Restrictions  Community Activity    Stability/Clinical Decision Making  Evolving/Moderate complexity    Rehab Potential  Excellent    PT Frequency  2x / week    PT Duration  6 weeks    PT Treatment/Interventions  ADLs/Self Care Home Management;Biofeedback;Electrical Stimulation;Cryotherapy;Moist Heat;Functional mobility training;Therapeutic activities;Therapeutic exercise;Patient/family education;Neuromuscular re-education;Taping;Dry needling;Manual techniques    PT Next Visit Plan  mini bridge with kegel progression, eccentric kegel    PT Home Exercise Plan  Access Code: [email or web address]    Consulted and Agree with Plan of Care  Patient       Patient will benefit from skilled therapeutic intervention in order to improve the following deficits and impairments:  Increased muscle spasms, Impaired tone, Decreased range of motion, Decreased strength, Increased fascial restricitons, Impaired flexibility, Postural dysfunction, Pain  Visit Diagnosis: Unspecified lack of coordination  Muscle  weakness (generalized)     Problem List Patient Active Problem List   Diagnosis Date Noted  . Anal cancer ([place]) 06/13/2019  . Hematuria, gross 03/11/2017  . Status post total shoulder arthroplasty, right 09/12/2016  . Arthritis of knee 02/27/2015  . Primary osteoarthritis of right knee Valgus 02/24/2015  . Neuropathy 09/07/2013  . Breast cancer of upper-outer quadrant of left female breast ([place]) 08/09/2013  . Breast cancer of upper-outer quadrant of right female breast ([place]) 08/09/2013  . Osteoarthritis of right hip 05/03/2013  . Osteoarthritis of left hip 02/15/2013    Jule Ser , PT 02/08/2020, 12:55 PM  [facility] [address], [place], [place], [address] Phone: [phone]   Fax:  [phone]  Name: Cynthia Velasquez MRN: [id number] Date of Birth: 1951-04-29

## 2020-02-10 DIAGNOSIS — Z90710 Acquired absence of both cervix and uterus: Secondary | ICD-10-CM | POA: Diagnosis not present

## 2020-02-10 DIAGNOSIS — Z01419 Encounter for gynecological examination (general) (routine) without abnormal findings: Secondary | ICD-10-CM | POA: Diagnosis not present

## 2020-02-10 DIAGNOSIS — Z853 Personal history of malignant neoplasm of breast: Secondary | ICD-10-CM | POA: Diagnosis not present

## 2020-02-10 DIAGNOSIS — Z90721 Acquired absence of ovaries, unilateral: Secondary | ICD-10-CM | POA: Diagnosis not present

## 2020-02-11 ENCOUNTER — Other Ambulatory Visit: Payer: Self-pay

## 2020-02-11 ENCOUNTER — Ambulatory Visit: Payer: 59 | Admitting: Physical Therapy

## 2020-02-11 DIAGNOSIS — R279 Unspecified lack of coordination: Secondary | ICD-10-CM

## 2020-02-11 DIAGNOSIS — M6281 Muscle weakness (generalized): Secondary | ICD-10-CM

## 2020-02-11 NOTE — Therapy (Signed)
[facility] [address] [place], [place], [address] Phone: [phone]   Fax:  [phone]  Physical Therapy Treatment  Patient Details  Name: Cynthia Velasquez MRN: [id number] Date of Birth: Jun 29, 1951 Referring Provider (PT): Truitt Merle, MD   Encounter Date: 02/11/2020  PT End of Session - 02/11/20 0938    Visit Number  24    Date for PT Re-Evaluation  03/21/20    Authorization Type  medicare - needs KX!!    PT Start Time  0930    PT Stop Time  1012    PT Time Calculation (min)  42 min    Activity Tolerance  Patient tolerated treatment well    Behavior During Therapy  WFL for tasks assessed/performed       Past Medical History:  Diagnosis Date  . Anxiety   . Arthritis   . Bilateral breast cancer ([place]) 10/21/2013  . Breast cancer ([place])   . Cancer ([place])   . Depression   . Gallstones   . GERD (gastroesophageal reflux disease)    doesn't take any meds for this  . Headache    h/o migraines      . History of bladder infections   . History of hiatal hernia   . History of migraine   . Hyperlipidemia    takes Simvastatin daily  . Hypertension    takes Hyzaar  . Joint pain   . Joint swelling   . Lumbar stenosis   . Neuropathy 09/07/2013  . Pneumonia   . Pre-diabetes     Past Surgical History:  Procedure Laterality Date  . ABDOMINAL HYSTERECTOMY    . bone spur removed from left foot    . CHOLECYSTECTOMY    . COLONOSCOPY    . double mastectomy     . HERNIA REPAIR     umbilical  . right knee arthroscopy    . right shoulder arthroscopy    . surgery for hiatal hernia    . TONSILLECTOMY    . TOTAL HIP ARTHROPLASTY Left 02/12/2013  . TOTAL HIP ARTHROPLASTY Left 02/12/2013   Procedure: LEFT TOTAL HIP ARTHROPLASTY;  Surgeon: Kerin Salen, MD;  Location: [facility];  Service: Orthopedics;  Laterality: Left;  . TOTAL HIP ARTHROPLASTY Right 05/03/2013   Procedure: TOTAL HIP ARTHROPLASTY;  Surgeon: Kerin Salen, MD;   Location: [facility];  Service: Orthopedics;  Laterality: Right;  . TOTAL KNEE ARTHROPLASTY Right 02/27/2015   Procedure: TOTAL KNEE ARTHROPLASTY;  Surgeon: Frederik Pear, MD;  Location: [facility];  Service: Orthopedics;  Laterality: Right;  . TOTAL SHOULDER ARTHROPLASTY Right 09/12/2016   Procedure: RIGHT TOTAL SHOULDER ARTHROPLASTY;  Surgeon: Tania Ade, MD;  Location: [facility];  Service: Orthopedics;  Laterality: Right;  RIGHT TOTAL SHOULDER ARTHROPLASTY    There were no vitals filed for this visit.  Subjective Assessment - 02/11/20 0942    Subjective  Pt states she had a tough time going to the bathroom yesterday and full incontinence of feces 2x back to back.  States it was more loose and liquidy like a thick liquid.    Patient Stated Goals  leakage, energy levels, being able to sleep at night    Currently in Pain?  No/denies                        [facility] Adult PT Treatment/Exercise - 02/11/20 0001      Neuro Re-ed    Neuro Re-ed Details  breathing and coordination of core and pelvic floor; working on eccentric lowering      Lumbar Exercises: Aerobic   Stationary Bike  L2 x 8 min with PT present      Lumbar Exercises: Machines for Strengthening   Leg Press  75# bilat seat 8 - 3 x 10      Lumbar Exercises: Seated   Other Seated Lumbar Exercises  hip IR/ER red band 2x10      Lumbar Exercises: Supine   Bridge  20 reps    Bridge Limitations  3x10 mini; 2x10 mini with ball squat               PT Short Term Goals - 11/16/19 1035      PT SHORT TERM GOAL #2   Title  pt will report 30% less leakage    Status  Achieved        PT Long Term Goals - 02/08/20 1251      PT LONG TERM GOAL #1   Title  Pt will be able to reduce leakage and use only 1-2 pads/day    Baseline  4 (down from 6/day)    Time  6    Period  Weeks    Status  New    Target Date  03/21/20      PT LONG TERM GOAL #2   Title  Pt will report no nocturia    Baseline  no nocturia recently     Period  Weeks    Status  On-going    Target Date  03/21/20      PT LONG TERM GOAL #3   Title  Pt will report she can feel the urge to urinate at least 90% of the time due to improved health of pelvic floor muscles    Baseline  can tell I have to go 50% of the time    Time  6    Period  Weeks    Status  Partially Met    Target Date  03/21/20      PT LONG TERM GOAL #4   Title  Pt will report at least 60% less leakage due to improved pelvic strength    Baseline  90% better (then states that she is talking about when she can feel the urge)    Time  6    Period  Weeks    Status  Partially Met    Target Date  03/21/20      PT LONG TERM GOAL #5   Title  Pt will be able to exercise consistently for at least 20 minutes 4-5 days/week due to improved function and energy levels    Baseline  I am doing the exercises 3 days/week at home and 2/weeks come here    Time  6    Period  Weeks    Status  Partially Met    Target Date  03/21/20            Plan - 02/11/20 0955    Clinical Impression Statement  Pt was fatigued with progression of LE and core strengthening.  Cues to engage TrA and pelvic floor were needed throughtout  the session.  Pt will benefit from skilled PT to continue to work on strength according to [place].    PT Treatment/Interventions  ADLs/Self Care Home Management;Biofeedback;Electrical Stimulation;Cryotherapy;Moist Heat;Functional mobility training;Therapeutic activities;Therapeutic exercise;Patient/family education;Neuromuscular re-education;Taping;Dry needling;Manual techniques    PT Next Visit Plan  mini bridge with kegel progression, eccentric kegel, leg press,  IR/ER hip rotation    PT Home Exercise Plan  Access Code: [email or web address]    [email or web address] and Agree with Plan of Care  Patient       Patient will benefit from skilled therapeutic intervention in order to improve the following deficits and impairments:  Increased muscle spasms, Impaired tone, Decreased range of motion,  Decreased strength, Increased fascial restricitons, Impaired flexibility, Postural dysfunction, Pain  Visit Diagnosis: Unspecified lack of coordination  Muscle weakness (generalized)     Problem List Patient Active Problem List   Diagnosis Date Noted  . Anal cancer ([place]) 06/13/2019  . Hematuria, gross 03/11/2017  . Status post total shoulder arthroplasty, right 09/12/2016  . Arthritis of knee 02/27/2015  . Primary osteoarthritis of right knee Valgus 02/24/2015  . Neuropathy 09/07/2013  . Breast cancer of upper-outer quadrant of left female breast ([place]) 08/09/2013  . Breast cancer of upper-outer quadrant of right female breast ([place]) 08/09/2013  . Osteoarthritis of right hip 05/03/2013  . Osteoarthritis of left hip 02/15/2013    Jule Ser, PT 02/11/2020, 11:00 AM  [facility] [address], [place], [place], [address] Phone: [phone]   Fax:  [phone]  Name: Cynthia Velasquez MRN: [id number] Date of Birth: 02/01/1951

## 2020-02-15 ENCOUNTER — Ambulatory Visit: Payer: 59 | Attending: Hematology | Admitting: Physical Therapy

## 2020-02-15 ENCOUNTER — Telehealth: Payer: Self-pay | Admitting: Physical Therapy

## 2020-02-15 DIAGNOSIS — R279 Unspecified lack of coordination: Secondary | ICD-10-CM | POA: Insufficient documentation

## 2020-02-15 DIAGNOSIS — M6281 Muscle weakness (generalized): Secondary | ICD-10-CM | POA: Insufficient documentation

## 2020-02-15 NOTE — Telephone Encounter (Signed)
Pt was called due to no show.  Patient reports that she had just gotten home from a school even for her god daughter and thought she was going to be done sooner.  She is aware of upcoming appointment  [facility], PT 02/15/20 12:50 PM

## 2020-02-18 ENCOUNTER — Ambulatory Visit: Payer: 59 | Admitting: Physical Therapy

## 2020-02-18 ENCOUNTER — Other Ambulatory Visit: Payer: Self-pay

## 2020-02-18 ENCOUNTER — Encounter: Payer: Self-pay | Admitting: Physical Therapy

## 2020-02-18 DIAGNOSIS — R279 Unspecified lack of coordination: Secondary | ICD-10-CM | POA: Diagnosis not present

## 2020-02-18 DIAGNOSIS — M6281 Muscle weakness (generalized): Secondary | ICD-10-CM | POA: Diagnosis not present

## 2020-02-18 NOTE — Therapy (Signed)
[facility] [address] [place], [place], [address] Phone: [phone]   Fax:  [phone]  Physical Therapy Treatment  Patient Details  Name: Cynthia Velasquez MRN: [id number] Date of Birth: 07-Feb-1951 Referring Provider (PT): Truitt Merle, MD   Encounter Date: 02/18/2020  PT End of Session - 02/18/20 1100    Visit Number  25    Date for PT Re-Evaluation  03/21/20    Authorization Type  medicare - needs KX!!    PT Start Time  0930    PT Stop Time  1015    PT Time Calculation (min)  45 min    Activity Tolerance  Patient tolerated treatment well       Past Medical History:  Diagnosis Date  . Anxiety   . Arthritis   . Bilateral breast cancer ([place]) 10/21/2013  . Breast cancer ([place])   . Cancer ([place])   . Depression   . Gallstones   . GERD (gastroesophageal reflux disease)    doesn't take any meds for this  . Headache    h/o migraines      . History of bladder infections   . History of hiatal hernia   . History of migraine   . Hyperlipidemia    takes Simvastatin daily  . Hypertension    takes Hyzaar  . Joint pain   . Joint swelling   . Lumbar stenosis   . Neuropathy 09/07/2013  . Pneumonia   . Pre-diabetes     Past Surgical History:  Procedure Laterality Date  . ABDOMINAL HYSTERECTOMY    . bone spur removed from left foot    . CHOLECYSTECTOMY    . COLONOSCOPY    . double mastectomy     . HERNIA REPAIR     umbilical  . right knee arthroscopy    . right shoulder arthroscopy    . surgery for hiatal hernia    . TONSILLECTOMY    . TOTAL HIP ARTHROPLASTY Left 02/12/2013  . TOTAL HIP ARTHROPLASTY Left 02/12/2013   Procedure: LEFT TOTAL HIP ARTHROPLASTY;  Surgeon: Kerin Salen, MD;  Location: [facility] [facility];  Service: Orthopedics;  Laterality: Left;  . TOTAL HIP ARTHROPLASTY Right 05/03/2013   Procedure: TOTAL HIP ARTHROPLASTY;  Surgeon: Kerin Salen, MD;  Location: [facility];  Service: Orthopedics;  Laterality: Right;   . TOTAL KNEE ARTHROPLASTY Right 02/27/2015   Procedure: TOTAL KNEE ARTHROPLASTY;  Surgeon: Frederik Pear, MD;  Location: [facility];  Service: Orthopedics;  Laterality: Right;  . TOTAL SHOULDER ARTHROPLASTY Right 09/12/2016   Procedure: RIGHT TOTAL SHOULDER ARTHROPLASTY;  Surgeon: Tania Ade, MD;  Location: [facility];  Service: Orthopedics;  Laterality: Right;  RIGHT TOTAL SHOULDER ARTHROPLASTY    There were no vitals filed for this visit.  Subjective Assessment - 02/18/20 0937    Subjective  Pt states she is feeling decent today.  I feel like a I have a knot in my stomach.  Pt denies pain    Patient Stated Goals  leakage, energy levels, being able to sleep at night    Currently in Pain?  No/denies                        [facility] Adult PT Treatment/Exercise - 02/18/20 0001      Lumbar Exercises: Aerobic   Nustep  L3 x 8 min with PT present      Lumbar Exercises: Machines for Strengthening   Leg Press  75# bilat  seat 8 - 3 x 15      Lumbar Exercises: Seated   Sit to Stand  20 reps    Sit to Stand Limitations  cues to sit slowly; from foam mat, arms forward for improved hip hinge    Other Seated Lumbar Exercises  hip IR/ER red band 2x10      Manual Therapy   Myofascial Release  abdominal fascial release focus on diaphragm from front/back with one hand posterior one hand anterior placement               PT Short Term Goals - 11/16/19 1035      PT SHORT TERM GOAL #2   Title  pt will report 30% less leakage    Status  Achieved        PT Long Term Goals - 02/08/20 1251      PT LONG TERM GOAL #1   Title  Pt will be able to reduce leakage and use only 1-2 pads/day    Baseline  4 (down from 6/day)    Time  6    Period  Weeks    Status  New    Target Date  03/21/20      PT LONG TERM GOAL #2   Title  Pt will report no nocturia    Baseline  no nocturia recently    Period  Weeks    Status  On-going    Target Date  03/21/20      PT LONG TERM GOAL #3    Title  Pt will report she can feel the urge to urinate at least 90% of the time due to improved health of pelvic floor muscles    Baseline  can tell I have to go 50% of the time    Time  6    Period  Weeks    Status  Partially Met    Target Date  03/21/20      PT LONG TERM GOAL #4   Title  Pt will report at least 60% less leakage due to improved pelvic strength    Baseline  90% better (then states that she is talking about when she can feel the urge)    Time  6    Period  Weeks    Status  Partially Met    Target Date  03/21/20      PT LONG TERM GOAL #5   Title  Pt will be able to exercise consistently for at least 20 minutes 4-5 days/week due to improved function and energy levels    Baseline  I am doing the exercises 3 days/week at home and 2/weeks come here    Time  6    Period  Weeks    Status  Partially Met    Target Date  03/21/20            Plan - 02/18/20 1111    Clinical Impression Statement  Pt did well with exercises today.  Pt demonstrates good posture and form with the sit stand.  Pt was able to do more reps with leg press today.  She had bowel sounds in upper abdomen with fascial release.  Pt will benefit from skilled PT to continue to progress core strength and function.    Comorbidities  chemo, radiation, hx of breast cancer, chronic low back pain    PT Treatment/Interventions  ADLs/Self Care Home Management;Biofeedback;Electrical Stimulation;Cryotherapy;Moist Heat;Functional mobility training;Therapeutic activities;Therapeutic exercise;Patient/family education;Neuromuscular re-education;Taping;Dry needling;Manual techniques    PT Next  Visit Plan  mini bridge with kegel progression, eccentric kegel, leg press, IR/ER hip rotation    PT Home Exercise Plan  Access Code: [email or web address]    [email or web address] and Agree with Plan of Care  Patient       Patient will benefit from skilled therapeutic intervention in order to improve the following deficits and impairments:  Increased  muscle spasms, Impaired tone, Decreased range of motion, Decreased strength, Increased fascial restricitons, Impaired flexibility, Postural dysfunction, Pain  Visit Diagnosis: Muscle weakness (generalized)  Unspecified lack of coordination     Problem List Patient Active Problem List   Diagnosis Date Noted  . Anal cancer ([place]) 06/13/2019  . Hematuria, gross 03/11/2017  . Status post total shoulder arthroplasty, right 09/12/2016  . Arthritis of knee 02/27/2015  . Primary osteoarthritis of right knee Valgus 02/24/2015  . Neuropathy 09/07/2013  . Breast cancer of upper-outer quadrant of left female breast ([place]) 08/09/2013  . Breast cancer of upper-outer quadrant of right female breast ([place]) 08/09/2013  . Osteoarthritis of right hip 05/03/2013  . Osteoarthritis of left hip 02/15/2013    Jule Ser, PT 02/18/2020, 12:08 PM  [facility] [address] [place], [place], [address] Phone: [phone]   Fax:  [phone]  Name: Cynthia Velasquez MRN: [id number] Date of Birth: 1950-10-04

## 2020-03-03 ENCOUNTER — Ambulatory Visit: Payer: 59 | Admitting: Physical Therapy

## 2020-03-07 ENCOUNTER — Ambulatory Visit: Payer: 59 | Admitting: Physical Therapy

## 2020-03-07 ENCOUNTER — Other Ambulatory Visit: Payer: Self-pay

## 2020-03-07 ENCOUNTER — Encounter: Payer: Self-pay | Admitting: Physical Therapy

## 2020-03-07 DIAGNOSIS — M6281 Muscle weakness (generalized): Secondary | ICD-10-CM | POA: Diagnosis not present

## 2020-03-07 DIAGNOSIS — R279 Unspecified lack of coordination: Secondary | ICD-10-CM

## 2020-03-07 NOTE — Therapy (Signed)
[facility] [address] [place], [place], [address] Phone: [phone]   Fax:  [phone]  Physical Therapy Treatment  Patient Details  Name: Cynthia Velasquez MRN: [id number] Date of Birth: 05/23/1951 Referring Provider (PT): Truitt Merle, MD   Encounter Date: 03/07/2020   PT End of Session - 03/07/20 0836    Visit Number 26    Date for PT Re-Evaluation 03/21/20    Authorization Type medicare - needs KX!!    PT Start Time 0805    PT Stop Time [phone]    PT Time Calculation (min) 38 min    Activity Tolerance Patient tolerated treatment well    Behavior During Therapy [facility] for tasks assessed/performed           Past Medical History:  Diagnosis Date  . Anxiety   . Arthritis   . Bilateral breast cancer ([place]) 10/21/2013  . Breast cancer ([place])   . Cancer ([place])   . Depression   . Gallstones   . GERD (gastroesophageal reflux disease)    doesn't take any meds for this  . Headache    h/o migraines      . History of bladder infections   . History of hiatal hernia   . History of migraine   . Hyperlipidemia    takes Simvastatin daily  . Hypertension    takes Hyzaar  . Joint pain   . Joint swelling   . Lumbar stenosis   . Neuropathy 09/07/2013  . Pneumonia   . Pre-diabetes     Past Surgical History:  Procedure Laterality Date  . ABDOMINAL HYSTERECTOMY    . bone spur removed from left foot    . CHOLECYSTECTOMY    . COLONOSCOPY    . double mastectomy     . HERNIA REPAIR     umbilical  . right knee arthroscopy    . right shoulder arthroscopy    . surgery for hiatal hernia    . TONSILLECTOMY    . TOTAL HIP ARTHROPLASTY Left 02/12/2013  . TOTAL HIP ARTHROPLASTY Left 02/12/2013   Procedure: LEFT TOTAL HIP ARTHROPLASTY;  Surgeon: Kerin Salen, MD;  Location: [place];  Service: Orthopedics;  Laterality: Left;  . TOTAL HIP ARTHROPLASTY Right 05/03/2013   Procedure: TOTAL HIP ARTHROPLASTY;  Surgeon: Kerin Salen, MD;   Location: [place];  Service: Orthopedics;  Laterality: Right;  . TOTAL KNEE ARTHROPLASTY Right 02/27/2015   Procedure: TOTAL KNEE ARTHROPLASTY;  Surgeon: Frederik Pear, MD;  Location: [place];  Service: Orthopedics;  Laterality: Right;  . TOTAL SHOULDER ARTHROPLASTY Right 09/12/2016   Procedure: RIGHT TOTAL SHOULDER ARTHROPLASTY;  Surgeon: Tania Ade, MD;  Location: [place];  Service: Orthopedics;  Laterality: Right;  RIGHT TOTAL SHOULDER ARTHROPLASTY    There were no vitals filed for this visit.   Subjective Assessment - 03/07/20 0808    Subjective Pt states she had no control since her last visit.  Then she got a cough and ribs and back hurt.  I had an accident in bed one night and did not know.  I have some numbness in the perineum    Patient Stated Goals leakage, energy levels, being able to sleep at night                             [facility] Adult PT Treatment/Exercise - 03/07/20 0001      Self-Care   Other Self-Care Comments  showing  patient dilators and educated on usage at home - she will bring her dilators in next time      Manual Therapy   Manual therapy comments pt identity confirmed and informed consent given to perform soft tissue internally    Internal Pelvic Floor bulbocavernosis and transvers peroneus fascial release                    PT Short Term Goals - 11/16/19 1035      PT SHORT TERM GOAL #2   Title pt will report 30% less leakage    Status Achieved             PT Long Term Goals - 02/08/20 1251      PT LONG TERM GOAL #1   Title Pt will be able to reduce leakage and use only 1-2 pads/day    Baseline 4 (down from 6/day)    Time 6    Period Weeks    Status New    Target Date 03/21/20      PT LONG TERM GOAL #2   Title Pt will report no nocturia    Baseline no nocturia recently    Period Weeks    Status On-going    Target Date 03/21/20      PT LONG TERM GOAL #3   Title Pt will report she can feel the urge to urinate at  least 90% of the time due to improved health of pelvic floor muscles    Baseline can tell I have to go 50% of the time    Time 6    Period Weeks    Status Partially Met    Target Date 03/21/20      PT LONG TERM GOAL #4   Title Pt will report at least 60% less leakage due to improved pelvic strength    Baseline 90% better (then states that she is talking about when she can feel the urge)    Time 6    Period Weeks    Status Partially Met    Target Date 03/21/20      PT LONG TERM GOAL #5   Title Pt will be able to exercise consistently for at least 20 minutes 4-5 days/week due to improved function and energy levels    Baseline I am doing the exercises 3 days/week at home and 2/weeks come here    Time 6    Period Weeks    Status Partially Met    Target Date 03/21/20                 Plan - 03/07/20 0837    Clinical Impression Statement Pt has felt like she had a setback since last time she was at PT.  Pt's session focused on stretching pelvic tissues.  This has been an ongoing struggle due to high pain levels. Pt will benefit from skilled PT to help her learn how to perform self stretching and use dilators for improved sof ttissue length.  Pt had good release from myofascial work on bulbocavernosis.    Comorbidities chemo, radiation, hx of breast cancer, chronic low back pain    PT Treatment/Interventions ADLs/Self Care Home Management;Biofeedback;Electrical Stimulation;Cryotherapy;Moist Heat;Functional mobility training;Therapeutic activities;Therapeutic exercise;Patient/family education;Neuromuscular re-education;Taping;Dry needling;Manual techniques    PT Next Visit Plan soft tissue; MFR to pelvic floor; using dilators    PT Home Exercise Plan Access Code: [email or web address]    [email or web address] and Agree with Plan of Care Patient  Patient will benefit from skilled therapeutic intervention in order to improve the following deficits and impairments:  Increased muscle spasms, Impaired  tone, Decreased range of motion, Decreased strength, Increased fascial restricitons, Impaired flexibility, Postural dysfunction, Pain  Visit Diagnosis: Muscle weakness (generalized)  Unspecified lack of coordination     Problem List Patient Active Problem List   Diagnosis Date Noted  . Anal cancer ([place]) 06/13/2019  . Hematuria, gross 03/11/2017  . Status post total shoulder arthroplasty, right 09/12/2016  . Arthritis of knee 02/27/2015  . Primary osteoarthritis of right knee Valgus 02/24/2015  . Neuropathy 09/07/2013  . Breast cancer of upper-outer quadrant of left female breast ([place]) 08/09/2013  . Breast cancer of upper-outer quadrant of right female breast ([place]) 08/09/2013  . Osteoarthritis of right hip 05/03/2013  . Osteoarthritis of left hip 02/15/2013    Jule Ser, PT 03/07/2020, 9:46 AM  [facility] [address], [place], [place], [address] Phone: [phone]   Fax:  [phone]  Name: Breunna Nordmann MRN: [id number] Date of Birth: [birth year]

## 2020-03-09 ENCOUNTER — Ambulatory Visit: Payer: 59 | Admitting: Physical Therapy

## 2020-03-09 ENCOUNTER — Other Ambulatory Visit: Payer: Self-pay

## 2020-03-09 DIAGNOSIS — R279 Unspecified lack of coordination: Secondary | ICD-10-CM

## 2020-03-09 DIAGNOSIS — M6281 Muscle weakness (generalized): Secondary | ICD-10-CM | POA: Diagnosis not present

## 2020-03-09 NOTE — Therapy (Signed)
[facility] [address] [place], [place], [address] Phone: [phone]   Fax:  [phone]  Physical Therapy Treatment  Patient Details  Name: Cynthia Velasquez MRN: [id number] Date of Birth: [birth year] Referring Provider (PT): Truitt Merle, MD   Encounter Date: 03/09/2020   PT End of Session - 03/09/20 0812    Visit Number 27    Date for PT Re-Evaluation 03/21/20    Authorization Type medicare - needs KX!!    PT Start Time 0804    PT Stop Time 0844    PT Time Calculation (min) 40 min    Activity Tolerance Patient tolerated treatment well    Behavior During Therapy [facility] for tasks assessed/performed           Past Medical History:  Diagnosis Date  . Anxiety   . Arthritis   . Bilateral breast cancer ([place]) 10/21/2013  . Breast cancer ([place])   . Cancer ([place])   . Depression   . Gallstones   . GERD (gastroesophageal reflux disease)    doesn't take any meds for this  . Headache    h/o migraines      . History of bladder infections   . History of hiatal hernia   . History of migraine   . Hyperlipidemia    takes Simvastatin daily  . Hypertension    takes Hyzaar  . Joint pain   . Joint swelling   . Lumbar stenosis   . Neuropathy 09/07/2013  . Pneumonia   . Pre-diabetes     Past Surgical History:  Procedure Laterality Date  . ABDOMINAL HYSTERECTOMY    . bone spur removed from left foot    . CHOLECYSTECTOMY    . COLONOSCOPY    . double mastectomy     . HERNIA REPAIR     umbilical  . right knee arthroscopy    . right shoulder arthroscopy    . surgery for hiatal hernia    . TONSILLECTOMY    . TOTAL HIP ARTHROPLASTY Left 02/12/2013  . TOTAL HIP ARTHROPLASTY Left 02/12/2013   Procedure: LEFT TOTAL HIP ARTHROPLASTY;  Surgeon: Kerin Salen, MD;  Location: [place];  Service: Orthopedics;  Laterality: Left;  . TOTAL HIP ARTHROPLASTY Right 05/03/2013   Procedure: TOTAL HIP ARTHROPLASTY;  Surgeon: Kerin Salen, MD;   Location: [place];  Service: Orthopedics;  Laterality: Right;  . TOTAL KNEE ARTHROPLASTY Right 02/27/2015   Procedure: TOTAL KNEE ARTHROPLASTY;  Surgeon: Frederik Pear, MD;  Location: [place];  Service: Orthopedics;  Laterality: Right;  . TOTAL SHOULDER ARTHROPLASTY Right 09/12/2016   Procedure: RIGHT TOTAL SHOULDER ARTHROPLASTY;  Surgeon: Tania Ade, MD;  Location: [place];  Service: Orthopedics;  Laterality: Right;  RIGHT TOTAL SHOULDER ARTHROPLASTY    There were no vitals filed for this visit.   Subjective Assessment - 03/09/20 0808    Subjective Pt brought the dilators.  She states she did not use them or try to use them yet since the first time she did and it was very painful    Patient Stated Goals leakage, energy levels, being able to sleep at night    Currently in Pain? Yes    Pain Score 10-Worst pain ever   10+ I called the doctor   Pain Location Back    Pain Orientation Right;Left    Pain Descriptors / Indicators Aching    Pain Type Chronic pain    Pain Onset More than a month ago  Pain Frequency Constant    Multiple Pain Sites No                             [facility] Adult PT Treatment/Exercise - 03/09/20 0001      Manual Therapy   Manual therapy comments pt identity confirmed and informed consent given to perform soft tissue internally    Soft tissue mobilization adductors    Myofascial Release external used desert harrvest for moisturizing/pain relief    Internal Pelvic Floor bulbocavernosis and transvers peroneus fascial release                    PT Short Term Goals - 11/16/19 1035      PT SHORT TERM GOAL #2   Title pt will report 30% less leakage    Status Achieved             PT Long Term Goals - 02/08/20 1251      PT LONG TERM GOAL #1   Title Pt will be able to reduce leakage and use only 1-2 pads/day    Baseline 4 (down from 6/day)    Time 6    Period Weeks    Status New    Target Date 03/21/20      PT LONG TERM GOAL #2     Title Pt will report no nocturia    Baseline no nocturia recently    Period Weeks    Status On-going    Target Date 03/21/20      PT LONG TERM GOAL #3   Title Pt will report she can feel the urge to urinate at least 90% of the time due to improved health of pelvic floor muscles    Baseline can tell I have to go 50% of the time    Time 6    Period Weeks    Status Partially Met    Target Date 03/21/20      PT LONG TERM GOAL #4   Title Pt will report at least 60% less leakage due to improved pelvic strength    Baseline 90% better (then states that she is talking about when she can feel the urge)    Time 6    Period Weeks    Status Partially Met    Target Date 03/21/20      PT LONG TERM GOAL #5   Title Pt will be able to exercise consistently for at least 20 minutes 4-5 days/week due to improved function and energy levels    Baseline I am doing the exercises 3 days/week at home and 2/weeks come here    Time 6    Period Weeks    Status Partially Met    Target Date 03/21/20                 Plan - 03/09/20 0840    Clinical Impression Statement Pt had no accidents since previous treatment.  No reports of pain after perinual and internal STM today.  Pt was very TTP but tolerated a little more stretching to bulbocavernosis.  pt had restrictions in skin around the perineum.  Pt was educated in using moisturizers to work on loosening perineual skin and work on continue to work on improve tissue mobility.    PT Treatment/Interventions ADLs/Self Care Home Management;Biofeedback;Electrical Stimulation;Cryotherapy;Moist Heat;Functional mobility training;Therapeutic activities;Therapeutic exercise;Patient/family education;Neuromuscular re-education;Taping;Dry needling;Manual techniques    PT Next Visit Plan soft tissue; MFR to pelvic floor;  using dilators    PT Home Exercise Plan Access Code: [email or web address]    [email or web address] and Agree with Plan of Care Patient           Patient will benefit  from skilled therapeutic intervention in order to improve the following deficits and impairments:  Increased muscle spasms, Impaired tone, Decreased range of motion, Decreased strength, Increased fascial restricitons, Impaired flexibility, Postural dysfunction, Pain  Visit Diagnosis: Muscle weakness (generalized)  Unspecified lack of coordination     Problem List Patient Active Problem List   Diagnosis Date Noted  . Anal cancer ([place]) 06/13/2019  . Hematuria, gross 03/11/2017  . Status post total shoulder arthroplasty, right 09/12/2016  . Arthritis of knee 02/27/2015  . Primary osteoarthritis of right knee Valgus 02/24/2015  . Neuropathy 09/07/2013  . Breast cancer of upper-outer quadrant of left female breast ([place]) 08/09/2013  . Breast cancer of upper-outer quadrant of right female breast ([place]) 08/09/2013  . Osteoarthritis of right hip 05/03/2013  . Osteoarthritis of left hip 02/15/2013    Jule Ser, PT 03/09/2020, 9:34 AM  [facility] [address], [place], [place], [address] Phone: [phone]   Fax:  [phone]  Name: Cynthia Velasquez MRN: [id number] Date of Birth: 1951-01-19

## 2020-03-10 ENCOUNTER — Encounter: Payer: Self-pay | Admitting: Nurse Practitioner

## 2020-03-14 ENCOUNTER — Ambulatory Visit: Payer: 59 | Admitting: Physical Therapy

## 2020-03-14 ENCOUNTER — Encounter: Payer: Self-pay | Admitting: Physical Therapy

## 2020-03-14 ENCOUNTER — Other Ambulatory Visit: Payer: Self-pay

## 2020-03-14 DIAGNOSIS — M6281 Muscle weakness (generalized): Secondary | ICD-10-CM | POA: Diagnosis not present

## 2020-03-14 DIAGNOSIS — R279 Unspecified lack of coordination: Secondary | ICD-10-CM

## 2020-03-14 DIAGNOSIS — S50862A Insect bite (nonvenomous) of left forearm, initial encounter: Secondary | ICD-10-CM | POA: Diagnosis not present

## 2020-03-14 NOTE — Therapy (Signed)
[facility] [address], [place], [place], [address] Phone: [phone]   Fax:  [phone]  Physical Therapy Treatment  Patient Details  Name: Cynthia Velasquez MRN: [id number] Date of Birth: Sep 01, 1951 Referring Provider (PT): Truitt Merle, MD   Encounter Date: 03/14/2020   PT End of Session - 03/14/20 0854    Visit Number 28    Date for PT Re-Evaluation 03/21/20    Authorization Type medicare - needs KX!!    PT Start Time 0848    PT Stop Time 0927    PT Time Calculation (min) 39 min    Activity Tolerance Patient tolerated treatment well    Behavior During Therapy [facility] for tasks assessed/performed           Past Medical History:  Diagnosis Date  . Anxiety   . Arthritis   . Bilateral breast cancer ([place]) 10/21/2013  . Breast cancer ([place])   . Cancer ([place])   . Depression   . Gallstones   . GERD (gastroesophageal reflux disease)    doesn't take any meds for this  . Headache    h/o migraines      . History of bladder infections   . History of hiatal hernia   . History of migraine   . Hyperlipidemia    takes Simvastatin daily  . Hypertension    takes Hyzaar  . Joint pain   . Joint swelling   . Lumbar stenosis   . Neuropathy 09/07/2013  . Pneumonia   . Pre-diabetes     Past Surgical History:  Procedure Laterality Date  . ABDOMINAL HYSTERECTOMY    . bone spur removed from left foot    . CHOLECYSTECTOMY    . COLONOSCOPY    . double mastectomy     . HERNIA REPAIR     umbilical  . right knee arthroscopy    . right shoulder arthroscopy    . surgery for hiatal hernia    . TONSILLECTOMY    . TOTAL HIP ARTHROPLASTY Left 02/12/2013  . TOTAL HIP ARTHROPLASTY Left 02/12/2013   Procedure: LEFT TOTAL HIP ARTHROPLASTY;  Surgeon: Kerin Salen, MD;  Location: [place];  Service: Orthopedics;  Laterality: Left;  . TOTAL HIP ARTHROPLASTY Right 05/03/2013   Procedure: TOTAL HIP ARTHROPLASTY;  Surgeon: Kerin Salen, MD;   Location: [place];  Service: Orthopedics;  Laterality: Right;  . TOTAL KNEE ARTHROPLASTY Right 02/27/2015   Procedure: TOTAL KNEE ARTHROPLASTY;  Surgeon: Frederik Pear, MD;  Location: [place];  Service: Orthopedics;  Laterality: Right;  . TOTAL SHOULDER ARTHROPLASTY Right 09/12/2016   Procedure: RIGHT TOTAL SHOULDER ARTHROPLASTY;  Surgeon: Tania Ade, MD;  Location: [place];  Service: Orthopedics;  Laterality: Right;  RIGHT TOTAL SHOULDER ARTHROPLASTY    There were no vitals filed for this visit.   Subjective Assessment - 03/14/20 0849    Subjective Pt states back is still very painful. Pt states she is able to hold a little longer to get to the bathroom.    Patient Stated Goals leakage, energy levels, being able to sleep at night    Currently in Pain? Yes    Pain Score 10-Worst pain ever   11/10   Pain Location Back    Pain Orientation Left    Pain Descriptors / Indicators Aching    Pain Type Chronic pain    Pain Radiating Towards back down to the left lateral leg    Pain Onset More than a month ago  Pain Frequency Intermittent    Pain Relieving Factors asper cream, pain meds    Multiple Pain Sites No                             [facility] Adult PT Treatment/Exercise - 03/14/20 0001      Lumbar Exercises: Stretches   Other Lumbar Stretch Exercise fwd flexion in sitting ball rolls with overpressure for fascial release      Manual Therapy   Manual therapy comments pt identity confirmed and informed consent given to perform soft tissue internally    Myofascial Release external to bulbol and ischiocav    Internal Pelvic Floor bulbocavernosis and transvers peroneus fascial release                    PT Short Term Goals - 11/16/19 1035      PT SHORT TERM GOAL #2   Title pt will report 30% less leakage    Status Achieved             PT Long Term Goals - 02/08/20 1251      PT LONG TERM GOAL #1   Title Pt will be able to reduce leakage and use only 1-2  pads/day    Baseline 4 (down from 6/day)    Time 6    Period Weeks    Status New    Target Date 03/21/20      PT LONG TERM GOAL #2   Title Pt will report no nocturia    Baseline no nocturia recently    Period Weeks    Status On-going    Target Date 03/21/20      PT LONG TERM GOAL #3   Title Pt will report she can feel the urge to urinate at least 90% of the time due to improved health of pelvic floor muscles    Baseline can tell I have to go 50% of the time    Time 6    Period Weeks    Status Partially Met    Target Date 03/21/20      PT LONG TERM GOAL #4   Title Pt will report at least 60% less leakage due to improved pelvic strength    Baseline 90% better (then states that she is talking about when she can feel the urge)    Time 6    Period Weeks    Status Partially Met    Target Date 03/21/20      PT LONG TERM GOAL #5   Title Pt will be able to exercise consistently for at least 20 minutes 4-5 days/week due to improved function and energy levels    Baseline I am doing the exercises 3 days/week at home and 2/weeks come here    Time 6    Period Weeks    Status Partially Met    Target Date 03/21/20                 Plan - 03/14/20 0921    Clinical Impression Statement Pt tolerated STM internal and external no increased pain after. she tolerated very light pressure into fascial layer and able to press into the bulbo and ischiocavernosis on Lt side but right side did not tolerate increaesd pressure.  pt has been consistently using dilators every other day now and she is able to have improved bowel control    Comorbidities chemo, radiation, hx of breast cancer, chronic low  back pain    PT Treatment/Interventions ADLs/Self Care Home Management;Biofeedback;Electrical Stimulation;Cryotherapy;Moist Heat;Functional mobility training;Therapeutic activities;Therapeutic exercise;Patient/family education;Neuromuscular re-education;Taping;Dry needling;Manual techniques    PT Next  Visit Plan re-eval next    PT Home Exercise Plan Access Code: [email or web address]    [email or web address] and Agree with Plan of Care Patient           Patient will benefit from skilled therapeutic intervention in order to improve the following deficits and impairments:  Increased muscle spasms, Impaired tone, Decreased range of motion, Decreased strength, Increased fascial restricitons, Impaired flexibility, Postural dysfunction, Pain  Visit Diagnosis: Muscle weakness (generalized)  Unspecified lack of coordination     Problem List Patient Active Problem List   Diagnosis Date Noted  . Anal cancer ([place]) 06/13/2019  . Hematuria, gross 03/11/2017  . Status post total shoulder arthroplasty, right 09/12/2016  . Arthritis of knee 02/27/2015  . Primary osteoarthritis of right knee Valgus 02/24/2015  . Neuropathy 09/07/2013  . Breast cancer of upper-outer quadrant of left female breast ([place]) 08/09/2013  . Breast cancer of upper-outer quadrant of right female breast ([place]) 08/09/2013  . Osteoarthritis of right hip 05/03/2013  . Osteoarthritis of left hip 02/15/2013    Jule Ser, PT 03/14/2020, 9:31 AM  [facility] [address] [place], [place], [address] Phone: [phone]   Fax:  [phone]  Name: Cynthia Velasquez MRN: [id number] Date of Birth: 05/14/1951

## 2020-03-15 ENCOUNTER — Telehealth: Payer: Self-pay

## 2020-03-15 DIAGNOSIS — F329 Major depressive disorder, single episode, unspecified: Secondary | ICD-10-CM | POA: Diagnosis not present

## 2020-03-15 DIAGNOSIS — M150 Primary generalized (osteo)arthritis: Secondary | ICD-10-CM | POA: Diagnosis not present

## 2020-03-15 DIAGNOSIS — F331 Major depressive disorder, recurrent, moderate: Secondary | ICD-10-CM | POA: Diagnosis not present

## 2020-03-15 DIAGNOSIS — E7800 Pure hypercholesterolemia, unspecified: Secondary | ICD-10-CM | POA: Diagnosis not present

## 2020-03-15 DIAGNOSIS — C20 Malignant neoplasm of rectum: Secondary | ICD-10-CM | POA: Diagnosis not present

## 2020-03-15 DIAGNOSIS — Z853 Personal history of malignant neoplasm of breast: Secondary | ICD-10-CM | POA: Diagnosis not present

## 2020-03-15 DIAGNOSIS — I10 Essential (primary) hypertension: Secondary | ICD-10-CM | POA: Diagnosis not present

## 2020-03-15 NOTE — Progress Notes (Signed)
[place]   [phone] [phone]   Clinic Follow up Note   Patient Care Team: Kathyrn Lass, MD as PCP - General (Family Medicine) Wonda Horner, MD as Consulting Physician (Gastroenterology) Truitt Merle, MD as Consulting Physician (Hematology) Alla Feeling, NP as Nurse Practitioner (Nurse Practitioner) Kyung Rudd, MD as Consulting Physician (Radiation Oncology)  Date of Service:  03/22/2020  CHIEF COMPLAINT: f/u of rectal cancer  SUMMARY OF ONCOLOGIC HISTORY: Oncology History Overview Note  Breast cancer of upper-outer quadrant of right female breast, (ER/PR positive, her2/Neu negative)   Primary site: Breast (Right)   Staging method: AJCC 7th Edition   Clinical: Stage IA (T1a, N0, cM0) signed by [name], MD on 09/07/2013 11:14 AM   Pathologic: Stage IA (T1a, N0, cM0) signed by [name], MD on 09/07/2013 11:14 AM   Summary: Stage IA (T1a, N0, cM0)  Breast cancer of upper-outer quadrant of left female breast (ER/PR/Her2Neu negative)   Primary site: Breast (Left)   Staging method: AJCC 7th Edition   Clinical: Stage IIA (T2, N0, cM0) signed by [name], MD on 09/07/2013 11:35 AM   Pathologic: Stage IIA (T2, N0, cM0) signed by [name], MD on 09/07/2013 11:35 AM   Summary: Stage IIA (T2, N0, cM0)     Breast cancer of upper-outer quadrant of left female breast ([place])  07/10/2007 Procedure   US biopsy showed invasive ductal carcinoma 0.8cm, Nottingham grade 3   07/30/2007 Surgery   She had left breast lumpectomy and LN biopsy which showed high grade invasive ductal cancer Nottingham grade 3, 2.7 cm, ER/PR/Her2 neu negative   11/24/2007 Surgery   Patient elected for bilateral mastectomy   09/07/2008 - 03/08/2009 Chemotherapy   dates are approximate: she received adriamycin and cytoxan followed by Taxol   03/08/2009 - 05/08/2009 Radiation Therapy   dates are approximate, she received XRT    Breast cancer of upper-outer quadrant of  right female breast ([place])  07/10/2006 Procedure   stereotactic biopsy showed atypical hyperplasia   10/23/2006 Surgery   right lumpectomy showed invasive ductal ca (Nottingham grade 1)  17m and DCIS, ER/PR positive Her 2 negative   10/09/2007 - 10/08/2012 Chemotherapy   Patient was placed on Tamoxifen   03/24/2017 Imaging   No acute findings. No evidence of recurrent carcinoma or metastatic disease   [place] cancer ([place]  06/04/2019 Procedure   Colonoscopy per Dr. SAnson Fret Findings-the digital rectal exam revealed a 3 cm diameter firm rectal mass.  The mass was noncircumferential and located predominantly at the right bowel wall at the anorectal junction. A nonobstructing mass was found at the anus and in the rectum    06/04/2019 Initial Biopsy   Follow pathology: Large intestine, rectum biopsy: Invasive well to moderately differentiated squamous cell carcinoma.  No rectal mucosa present.  There is strong diffuse expression of P 16 immunostain.  CDX 2, p63 and mCEA immunostains are also used in the diagnostic work-up of the case.   06/04/2019 Initial Diagnosis   [place] cancer ([place]   06/21/2019 PET scan   IMPRESSION: 1. Anorectal primary. No hypermetabolic metastatic disease within the chest, abdomen, or pelvis. Perirectal nodes, at least 1 of which is new since 02/26/2018 CT, suspicious based on size and interval development. 2. Mild limitations secondary to beam hardening artifact from bilateral hip arthroplasty. 3.  Aortic Atherosclerosis (ICD10-I70.0).   06/28/2019 Cancer Staging   Staging form: Anus, AJCC 8th Edition - Clinical stage from 06/28/2019: Stage IIA (cT2, cN0,  cM0) - Signed by Alla Feeling, NP on 06/28/2019   06/28/2019 - 07/26/2019 Chemotherapy   concurrent chemoRT with Mitomycin and 5FU on week 1 and week 5 on starting 06/28/19. Last dose on 07/26/19   06/28/2019 - 08/09/2019 Radiation Therapy   concurrent chemoRT with Dr [name] 06/28/19-08/09/19   11/01/2019 PET  scan   IMPRESSION: 1. Marked interval decrease in hypermetabolism noted at the level of the anal rectal primary. No evidence for hypermetabolic metastatic disease in the chest, abdomen, or pelvis. The perirectal lymph nodes identified previously have resolved in the interval. 2.  Aortic Atherosclerois (ICD10-170.0)      CURRENT THERAPY:  Surveillance   INTERVAL HISTORY:  Cynthia Velasquez is here for a follow up of rectal cancer. She was last seen by me 7 months ago. She was seen by NP Lacie in interim. She presents to the clinic alone. She notes having fatigue, bowel issues which is pencil shaped and uncontrollable. She is seeing Dr Penelope Coop and had partial endoscopy which was clear. She will have stool incontinence once a week now that she has reduced eating. She still has dysuria. She notes she is currently doing PT for pelvic Floor, it was intimally helping. She notes she is able to get up and do what she has to as fast as she can because once she sits she will fall asleep. She notes she has tried AZO before without help. She also notes little hair growth since chemo. Dr Penelope Coop has requested her to see Dr [name] soon. She had 2 hernia repair surgeries in the past. She is mostly eating salad and chicken and rice. She feels her weight is trending down. She has Ensure as well.    REVIEW OF SYSTEMS:   Constitutional: Denies fevers, chills or abnormal weight loss (+) Fatigue  Eyes: Denies blurriness of vision Ears, nose, mouth, throat, and face: Denies mucositis or sore throat Respiratory: Denies cough, dyspnea or wheezes Cardiovascular: Denies palpitation, chest discomfort or lower extremity swelling Gastrointestinal:  Denies nausea, heartburn or change (+) Pencil shaped stool (+) incontinence GU: (+) Dysuria and urine incontinence.  Skin: Denies abnormal skin rashes (+) Low hair growth.  Lymphatics: Denies new lymphadenopathy or easy bruising Neurological:Denies numbness, tingling or new  weaknesses Behavioral/Psych: Mood is stable, no new changes  All other systems were reviewed with the patient and are negative.  MEDICAL HISTORY:  Past Medical History:  Diagnosis Date   Anxiety    Arthritis    Bilateral breast cancer ([place]) 10/21/2013   Breast cancer ([place])    Cancer ([place])    Depression    Gallstones    GERD (gastroesophageal reflux disease)    doesn't take any meds for this   Headache    h/o migraines       History of bladder infections    History of hiatal hernia    History of migraine    Hyperlipidemia    takes Simvastatin daily   Hypertension    takes Hyzaar   Joint pain    Joint swelling    Lumbar stenosis    Neuropathy 09/07/2013   Pneumonia    Pre-diabetes     SURGICAL HISTORY: Past Surgical History:  Procedure Laterality Date   ABDOMINAL HYSTERECTOMY     bone spur removed from left foot     CHOLECYSTECTOMY     COLONOSCOPY     double mastectomy      HERNIA REPAIR     umbilical   right knee arthroscopy  right shoulder arthroscopy     surgery for hiatal hernia     TONSILLECTOMY     TOTAL HIP ARTHROPLASTY Left 02/12/2013   TOTAL HIP ARTHROPLASTY Left 02/12/2013   Procedure: LEFT TOTAL HIP ARTHROPLASTY;  Surgeon: Kerin Salen, MD;  Location: [facility];  Service: Orthopedics;  Laterality: Left;   TOTAL HIP ARTHROPLASTY Right 05/03/2013   Procedure: TOTAL HIP ARTHROPLASTY;  Surgeon: Kerin Salen, MD;  Location: [facility];  Service: Orthopedics;  Laterality: Right;   TOTAL KNEE ARTHROPLASTY Right 02/27/2015   Procedure: TOTAL KNEE ARTHROPLASTY;  Surgeon: Frederik Pear, MD;  Location: [facility];  Service: Orthopedics;  Laterality: Right;   TOTAL SHOULDER ARTHROPLASTY Right 09/12/2016   Procedure: RIGHT TOTAL SHOULDER ARTHROPLASTY;  Surgeon: Tania Ade, MD;  Location: [facility];  Service: Orthopedics;  Laterality: Right;  RIGHT TOTAL SHOULDER ARTHROPLASTY    I have reviewed the social history and family history with the  patient and they are unchanged from previous note.  ALLERGIES:  is allergic to oxycodone-acetaminophen, codeine, and vicodin [hydrocodone-acetaminophen].  MEDICATIONS:  Current Outpatient Medications  Medication Sig Dispense Refill   LINZESS 72 MCG capsule      losartan (COZAAR) 100 MG tablet TK 1 T PO D  6   ondansetron (ZOFRAN ODT) 4 MG disintegrating tablet Take 1 tablet (4 mg total) by mouth every 8 (eight) hours as needed for nausea or vomiting. 6 tablet 0   PROAIR HFA 108 (90 Base) MCG/ACT inhaler 2 inhalations every 4-6 hours as needed. (Patient taking differently: Inhale 2 puffs into the lungs every 6 (six) hours as needed for wheezing or shortness of breath. ) 18 g 1   rosuvastatin (CRESTOR) 40 MG tablet Take 40 mg by mouth daily.     No current facility-administered medications for this visit.    PHYSICAL EXAMINATION: ECOG PERFORMANCE STATUS: 1 - Symptomatic but completely ambulatory  Vitals:   03/22/20 1321  BP: (!) 146/96  Pulse: 67  Resp: 18  Temp: (!) 97.5 F (36.4 C)  SpO2: 100%   Filed Weights   03/22/20 1321  Weight: 217 lb 8 oz (98.7 kg)    GENERAL:alert, no distress and comfortable SKIN: skin color, texture, turgor are normal, no rashes or significant lesions EYES: normal, Conjunctiva are pink and non-injected, sclera clear  NECK: supple, thyroid normal size, non-tender, without nodularity LYMPH:  no palpable lymphadenopathy in the cervical, axillary or inguinal LUNGS: clear to auscultation and percussion with normal breathing effort HEART: regular rate & rhythm and no murmurs and no lower extremity edema ABDOMEN:abdomen soft, non-tender and normal bowel sounds Musculoskeletal:no cyanosis of digits and no clubbing  NEURO: alert & oriented x 3 with fluent speech, no focal motor/sensory deficits RECTAL: No palpable mass. No blood on glove. Normal stool present. Partial exam benign.  (+) Low muscle tone of sphincter along with tenderness.   LABORATORY  DATA:  I have reviewed the data as listed CBC Latest Ref Rng & Units 03/22/2020 12/23/2019 10/25/2019  WBC 4.0 - 10.5 K/uL 3.2(L) 3.4(L) 3.2(L)  Hemoglobin 12.0 - 15.0 g/dL 12.2 12.1 10.9(L)  Hematocrit 36 - 46 % 38.9 39.1 35.2(L)  Platelets 150 - 400 K/uL 164 161 149(L)     CMP Latest Ref Rng & Units 03/22/2020 12/23/2019 10/25/2019  Glucose 70 - 99 mg/dL 93 96 90  BUN 8 - 23 mg/dL '16 14 13  ' Creatinine 0.44 - 1.00 mg/dL 0.96 0.89 0.91  Sodium 135 - 145 mmol/L 142 141 143  Potassium 3.5 -  5.1 mmol/L 3.9 4.2 3.9  Chloride 98 - 111 mmol/L 109 109 110  CO2 22 - 32 mmol/L '25 27 26  ' Calcium 8.9 - 10.3 mg/dL 9.5 9.1 9.1  Total Protein 6.5 - 8.1 g/dL 7.0 7.4 6.9  Total Bilirubin 0.3 - 1.2 mg/dL 0.4 0.4 0.4  Alkaline Phos 38 - 126 U/L 87 82 72  AST 15 - 41 U/L '18 17 18  ' ALT 0 - 44 U/L '14 13 15      ' RADIOGRAPHIC STUDIES: I have personally reviewed the radiological images as listed and agreed with the findings in the report. No results found.   ASSESSMENT & PLAN:  Cynthia Velasquez is a 69 y.o. female with    1. Anal Squamous Cell Carcinoma, cT2N0M0 -She was diagnosed in 05/2019.She presented with rectal bleeding and anal pressure.Workup showed a 3 cm mass at the anorectal junction, biopsy confirmedwell to moderately differentiated squamous cell carcinoma.10/5/20PET scan shows no metastasis. -She completed standard treatment withconcurrent chemoRT with Mitomycin and 5FU on 08/09/19.  -PET scan on 11/01/19 showed marked interval decrease in hypermetabolism of the primary anal mass and resolution of the perirectal lymph nodes. No evidence of metastatic disease in the chest, abdomen or pelvis. Overall great response to treatment, near resolved.  -She is overall stable. She notes persistent pencil shaped stool, incontinence , dysuria, and fatigue from prior treatment. I reviewed management with her.  Per patient, she had repeated sigmoidoscopy by Dr. Penelope Coop recently, which showed no evidence of  residual disease.  We will try to get that report from Dr. Estell Harpin office  -Labs reviewed, CBC And CMP WNL except WBC 3.2. Physical exam including rectal exam was unremarkable.   -Continue Surveillance. Next scan in 6 months.  -F/u in 4 months. She will f/u with Dr [name] in interim   2. Stool and urine incontinence, Dysuria, Fatigue -Since completing treatment she notes small calibre stool, followed by an uncontrollable BM. She will had partial colonoscopy with Dr. Penelope Coop on 01/10/20.  -Her symptoms persist with pencil shaped stool, incontinence, dysuria, and fatigue from prior treatment. Her stool incontinence is now once a week with reduced eating. Her weight is mostly stable.  -I discussed this will take time to recover and may not recover completely, but should improve.  -She does have low muscle tone during rectal exam today (03/22/20). She notes h/o 4 pregnancies before.  -She will continue to wear pads until she needs depends. She will continue Pelvic Floor exercise/PT.  -I offered dietician referral. She declined. I encouraged her to eat balanced meals. She notes to eating salads, chicken, and rice. She notes having Ensure.   3. H/o bilateral breast cancer, Genetic Testing  2007: right breast biopsy showed atypical hyperplasia, s/p right lumpectomy in 06/2006 ER/PR+/HER2-, grade 1. Completed tamoxifen from 09/2007 - 09/2012 2008: left breast invasive ductal carcinoma, triple negative, grade 3 s/p lumpectomy in 07/2007 and eventual bilateral mastectomy; s/p adjuvant chemo AC-T and radiation  -Per pt her Genetics Testing was negative. She was adopted, no known family history   4. HTN -on losartan managed by PCP -BP 146/96 today (03/22/20).   PLAN: -I encourage her to continue pelvic PT and exercise  -Lab and F/u in 4 months. I will order next CT scan at that time.  -f/u with Dr. [name] in the interim  -will get copy of her sigmoidoscopy from Dr. Estell Harpin office   No problem-specific  Assessment & Plan notes found for this encounter.   No orders of the  defined types were placed in this encounter.  All questions were answered. The patient knows to call the clinic with any problems, questions or concerns. No barriers to learning was detected. The total time spent in the appointment was 30 minutes.     Truitt Merle, MD 03/22/2020   I, Joslyn Devon, am acting as scribe for Truitt Merle, MD.   I have reviewed the above documentation for accuracy and completeness, and I agree with the above.

## 2020-03-15 NOTE — Telephone Encounter (Signed)
VM left for pt to return call to [facility]

## 2020-03-15 NOTE — Telephone Encounter (Signed)
VM received from pt stating she missed our call. No answer upon return call.

## 2020-03-16 ENCOUNTER — Ambulatory Visit: Payer: PPO | Admitting: Physical Therapy

## 2020-03-22 ENCOUNTER — Other Ambulatory Visit: Payer: Self-pay

## 2020-03-22 ENCOUNTER — Inpatient Hospital Stay: Payer: 59

## 2020-03-22 ENCOUNTER — Encounter: Payer: Self-pay | Admitting: Hematology

## 2020-03-22 ENCOUNTER — Telehealth: Payer: Self-pay | Admitting: Hematology

## 2020-03-22 ENCOUNTER — Inpatient Hospital Stay: Payer: 59 | Attending: Hematology | Admitting: Hematology

## 2020-03-22 VITALS — BP 146/96 | HR 67 | Temp 97.5°F | Resp 18 | Ht 65.0 in | Wt 217.5 lb

## 2020-03-22 DIAGNOSIS — Z9221 Personal history of antineoplastic chemotherapy: Secondary | ICD-10-CM | POA: Diagnosis not present

## 2020-03-22 DIAGNOSIS — R159 Full incontinence of feces: Secondary | ICD-10-CM | POA: Insufficient documentation

## 2020-03-22 DIAGNOSIS — C210 Malignant neoplasm of anus, unspecified: Secondary | ICD-10-CM

## 2020-03-22 DIAGNOSIS — C50412 Malignant neoplasm of upper-outer quadrant of left female breast: Secondary | ICD-10-CM | POA: Diagnosis not present

## 2020-03-22 DIAGNOSIS — Z170 Estrogen receptor positive status [ER+]: Secondary | ICD-10-CM

## 2020-03-22 DIAGNOSIS — R32 Unspecified urinary incontinence: Secondary | ICD-10-CM | POA: Diagnosis not present

## 2020-03-22 DIAGNOSIS — R300 Dysuria: Secondary | ICD-10-CM | POA: Insufficient documentation

## 2020-03-22 DIAGNOSIS — R5383 Other fatigue: Secondary | ICD-10-CM | POA: Diagnosis not present

## 2020-03-22 DIAGNOSIS — Z9013 Acquired absence of bilateral breasts and nipples: Secondary | ICD-10-CM | POA: Insufficient documentation

## 2020-03-22 DIAGNOSIS — I10 Essential (primary) hypertension: Secondary | ICD-10-CM | POA: Diagnosis not present

## 2020-03-22 DIAGNOSIS — Z923 Personal history of irradiation: Secondary | ICD-10-CM | POA: Diagnosis not present

## 2020-03-22 DIAGNOSIS — Z853 Personal history of malignant neoplasm of breast: Secondary | ICD-10-CM | POA: Diagnosis not present

## 2020-03-22 LAB — CMP (CANCER CENTER ONLY)
ALT: 14 U/L (ref 0–44)
AST: 18 U/L (ref 15–41)
Albumin: 3.5 g/dL (ref 3.5–5.0)
Alkaline Phosphatase: 87 U/L (ref 38–126)
Anion gap: 8 (ref 5–15)
BUN: 16 mg/dL (ref 8–23)
CO2: 25 mmol/L (ref 22–32)
Calcium: 9.5 mg/dL (ref 8.9–10.3)
Chloride: 109 mmol/L (ref 98–111)
Creatinine: 0.96 mg/dL (ref 0.44–1.00)
GFR, Est AFR Am: >60 mL/min (ref >60)
GFR, Estimated: >60 mL/min (ref >60)
Glucose, Bld: 93 mg/dL (ref 70–99)
Potassium: 3.9 mmol/L (ref 3.5–5.1)
Sodium: 142 mmol/L (ref 135–145)
Total Bilirubin: 0.4 mg/dL (ref 0.3–1.2)
Total Protein: 7 g/dL (ref 6.5–8.1)

## 2020-03-22 LAB — CBC WITH DIFFERENTIAL (CANCER CENTER ONLY)
Abs Immature Granulocytes: 0.01 10*3/uL (ref 0.00–0.07)
Basophils Absolute: 0 10*3/uL (ref 0.0–0.1)
Basophils Relative: 1 %
Eosinophils Absolute: 0.1 10*3/uL (ref 0.0–0.5)
Eosinophils Relative: 2 %
HCT: 38.9 % (ref 36.0–46.0)
Hemoglobin: 12.2 g/dL (ref 12.0–15.0)
Immature Granulocytes: 0 %
Lymphocytes Relative: 29 %
Lymphs Abs: 0.9 10*3/uL (ref 0.7–4.0)
MCH: 27.7 pg (ref 26.0–34.0)
MCHC: 31.4 g/dL (ref 30.0–36.0)
MCV: 88.2 fL (ref 80.0–100.0)
Monocytes Absolute: 0.4 10*3/uL (ref 0.1–1.0)
Monocytes Relative: 14 %
Neutro Abs: 1.7 10*3/uL (ref 1.7–7.7)
Neutrophils Relative %: 54 %
Platelet Count: 164 10*3/uL (ref 150–400)
RBC: 4.41 MIL/uL (ref 3.87–5.11)
RDW: 15.4 % (ref 11.5–15.5)
WBC Count: 3.2 10*3/uL — ABNORMAL LOW (ref 4.0–10.5)
nRBC: 0 % (ref 0.0–0.2)

## 2020-03-22 LAB — IRON AND TIBC
Iron: 64 ug/dL (ref 41–142)
Saturation Ratios: 23 % (ref 21–57)
TIBC: 284 ug/dL (ref 236–444)
UIBC: 219 ug/dL (ref 120–384)

## 2020-03-22 LAB — FERRITIN: Ferritin: 32 ng/mL (ref 11–307)

## 2020-03-22 NOTE — Telephone Encounter (Signed)
Scheduled per 7/7 los. Printed avs and calendar for pt  

## 2020-03-23 ENCOUNTER — Encounter: Payer: Self-pay | Admitting: Hematology

## 2020-03-23 ENCOUNTER — Ambulatory Visit: Payer: 59 | Attending: Hematology | Admitting: Physical Therapy

## 2020-03-23 ENCOUNTER — Other Ambulatory Visit: Payer: Self-pay

## 2020-03-23 ENCOUNTER — Encounter: Payer: Self-pay | Admitting: Physical Therapy

## 2020-03-23 DIAGNOSIS — M6281 Muscle weakness (generalized): Secondary | ICD-10-CM

## 2020-03-23 DIAGNOSIS — R279 Unspecified lack of coordination: Secondary | ICD-10-CM | POA: Diagnosis present

## 2020-03-23 NOTE — Therapy (Signed)
[facility] [address], [place], [place], [address] Phone: [phone]   Fax:  [phone]  Physical Therapy Treatment  Patient Details  Name: Cynthia Velasquez MRN: [id number] Date of Birth: 1950-10-14 Referring Provider (PT): Truitt Merle, MD   Encounter Date: 03/23/2020   PT End of Session - 03/23/20 0759    Visit Number 29    Date for PT Re-Evaluation 05/18/20    Authorization Type medicare - needs KX!!    PT Start Time 0800    PT Stop Time [phone]    PT Time Calculation (min) 38 min    Activity Tolerance Patient tolerated treatment well    Behavior During Therapy WFL for tasks assessed/performed           Past Medical History:  Diagnosis Date  . Anxiety   . Arthritis   . Bilateral breast cancer ([place]) 10/21/2013  . Breast cancer ([place])   . Cancer ([place])   . Depression   . Gallstones   . GERD (gastroesophageal reflux disease)    doesn't take any meds for this  . Headache    h/o migraines      . History of bladder infections   . History of hiatal hernia   . History of migraine   . Hyperlipidemia    takes Simvastatin daily  . Hypertension    takes Hyzaar  . Joint pain   . Joint swelling   . Lumbar stenosis   . Neuropathy 09/07/2013  . Pneumonia   . Pre-diabetes     Past Surgical History:  Procedure Laterality Date  . ABDOMINAL HYSTERECTOMY    . bone spur removed from left foot    . CHOLECYSTECTOMY    . COLONOSCOPY    . double mastectomy     . HERNIA REPAIR     umbilical  . right knee arthroscopy    . right shoulder arthroscopy    . surgery for hiatal hernia    . TONSILLECTOMY    . TOTAL HIP ARTHROPLASTY Left 02/12/2013  . TOTAL HIP ARTHROPLASTY Left 02/12/2013   Procedure: LEFT TOTAL HIP ARTHROPLASTY;  Surgeon: Kerin Salen, MD;  Location: [facility];  Service: Orthopedics;  Laterality: Left;  . TOTAL HIP ARTHROPLASTY Right 05/03/2013   Procedure: TOTAL HIP ARTHROPLASTY;  Surgeon: Kerin Salen, MD;   Location: [facility];  Service: Orthopedics;  Laterality: Right;  . TOTAL KNEE ARTHROPLASTY Right 02/27/2015   Procedure: TOTAL KNEE ARTHROPLASTY;  Surgeon: Frederik Pear, MD;  Location: [facility];  Service: Orthopedics;  Laterality: Right;  . TOTAL SHOULDER ARTHROPLASTY Right 09/12/2016   Procedure: RIGHT TOTAL SHOULDER ARTHROPLASTY;  Surgeon: Tania Ade, MD;  Location: [facility];  Service: Orthopedics;  Laterality: Right;  RIGHT TOTAL SHOULDER ARTHROPLASTY    There were no vitals filed for this visit.   Subjective Assessment - 03/23/20 0850    Subjective I went to the doctor and she said no issues.  Pain with urination is worse and sensation is worse of knowing when I have to use the bathroom.  MD said it was the radiation.  Pt reports increased and thicker pad use    Patient Stated Goals leakage, energy levels, being able to sleep at night    Currently in Pain? No/denies              [facility] PT Assessment - 03/23/20 0001      Assessment   Medical Diagnosis C21.0 (ICD-10-CM) - Anal cancer ([place])    Referring  Provider (PT) Truitt Merle, MD                      Pelvic Floor Special Questions - 03/23/20 0001    Pad use 5-6/ day thick pads    Pelvic Floor Internal Exam pt informed and identity confirmed for informed consent    Exam Type Rectal    Strength weak squeeze, no lift   up to 3/5 after TC and warm up   Strength # of seconds 2    Tone difficulty relaxing after contraction needs TC and breathing           tactile cues for contract, relax and bulge to work on improved sensation.  Educated on taking longer rest breaks between reps during exercises  [facility] Adult PT Treatment/Exercise - 03/23/20 0001      Manual Therapy   Manual therapy comments pt identity confirmed and informed consent given to perform soft tissue internally    Internal Pelvic Floor anal sphincters and puborectalis contract/relax and stretching                    PT Short Term Goals - 11/16/19  1035      PT SHORT TERM GOAL #2   Title pt will report 30% less leakage    Status Achieved             PT Long Term Goals - 03/23/20 0805      PT LONG TERM GOAL #1   Title Pt will be able to reduce leakage and use only 1-2 pads/day    Baseline lately it has been more and is using thicker pads; 5-6/day    Time 8    Period Weeks    Status On-going    Target Date 05/18/20      PT LONG TERM GOAL #2   Title Pt will report no nocturia    Baseline had gotten better but recently her pads are wet at night which is new    Time 8    Period Weeks    Status On-going    Target Date 05/18/20      PT LONG TERM GOAL #3   Title Pt will report she can feel the urge to urinate at least 90% of the time due to improved health of pelvic floor muscles    Baseline it was 50% of the time but now less than 50%    Time 8    Period Weeks    Status On-going    Target Date 05/18/20      PT LONG TERM GOAL #4   Title Pt will report she can control it 80% of the time when she feels the urge    Baseline 70%    Time 8    Period Weeks    Status Revised    Target Date 05/18/20      PT LONG TERM GOAL #5   Title Pt will be able to exercise consistently for at least 20 minutes 4-5 days/week due to improved function and energy levels    Time 8    Period Weeks    Status On-going    Target Date 05/18/20      Additional Long Term Goals   Additional Long Term Goals Yes      PT LONG TERM GOAL #6   Title Pt will report pain during urination reduced to 5/10 at most.    Baseline 10/10    Time 8  Period Weeks    Status New    Target Date 05/18/20                 Plan - 03/23/20 0814    Clinical Impression Statement Pt has began to be able to tolerate internal STM and treatments more recently.  Since we have just started focusing more on manual treatments it will benefit patient to continue to work with skilled PT.  She has noticed setback with her progress due to decreased sensation and  increased pain recently.  Pt is unable to perform manual treatment and TC needed to make progress on her own.  She is expected to continue to make progress and will likely continue to regress without skilled PT intervention.  Pt will benefit from skilled PT to address impairments.    PT Treatment/Interventions ADLs/Self Care Home Management;Biofeedback;Electrical Stimulation;Cryotherapy;Moist Heat;Functional mobility training;Therapeutic activities;Therapeutic exercise;Patient/family education;Neuromuscular re-education;Taping;Dry needling;Manual techniques    PT Next Visit Plan manual to anal sphincter; scar tissue release and STM to perineum for improved sensation and endurance of pelvic floor    PT Home Exercise Plan Access Code: [email or web address]    Consulted and Agree with Plan of Care Patient           Patient will benefit from skilled therapeutic intervention in order to improve the following deficits and impairments:  Increased muscle spasms, Impaired tone, Decreased range of motion, Decreased strength, Increased fascial restricitons, Impaired flexibility, Postural dysfunction, Pain  Visit Diagnosis: Muscle weakness (generalized) - Plan: PT plan of care cert/re-cert  Unspecified lack of coordination - Plan: PT plan of care cert/re-cert     Problem List Patient Active Problem List   Diagnosis Date Noted  . Anal cancer ([place]) 06/13/2019  . Hematuria, gross 03/11/2017  . Status post total shoulder arthroplasty, right 09/12/2016  . Arthritis of knee 02/27/2015  . Primary osteoarthritis of right knee Valgus 02/24/2015  . Neuropathy 09/07/2013  . Breast cancer of upper-outer quadrant of left female breast ([place]) 08/09/2013  . Breast cancer of upper-outer quadrant of right female breast ([place]) 08/09/2013  . Osteoarthritis of right hip 05/03/2013  . Osteoarthritis of left hip 02/15/2013    Jule Ser, PT 03/23/2020, 9:25 AM  [facility] [address] [place], [place], [address] Phone: [phone]   Fax:  [phone]  Name: Taquila Leys MRN: [id number] Date of Birth: Mar 24, 1951

## 2020-03-27 DIAGNOSIS — M47816 Spondylosis without myelopathy or radiculopathy, lumbar region: Secondary | ICD-10-CM | POA: Diagnosis not present

## 2020-03-28 ENCOUNTER — Encounter: Payer: PPO | Admitting: Physical Therapy

## 2020-03-29 DIAGNOSIS — I10 Essential (primary) hypertension: Secondary | ICD-10-CM | POA: Diagnosis not present

## 2020-03-29 DIAGNOSIS — E7800 Pure hypercholesterolemia, unspecified: Secondary | ICD-10-CM | POA: Diagnosis not present

## 2020-03-29 DIAGNOSIS — F329 Major depressive disorder, single episode, unspecified: Secondary | ICD-10-CM | POA: Diagnosis not present

## 2020-03-29 DIAGNOSIS — Z853 Personal history of malignant neoplasm of breast: Secondary | ICD-10-CM | POA: Diagnosis not present

## 2020-03-29 DIAGNOSIS — F331 Major depressive disorder, recurrent, moderate: Secondary | ICD-10-CM | POA: Diagnosis not present

## 2020-03-29 DIAGNOSIS — C20 Malignant neoplasm of rectum: Secondary | ICD-10-CM | POA: Diagnosis not present

## 2020-03-29 DIAGNOSIS — M150 Primary generalized (osteo)arthritis: Secondary | ICD-10-CM | POA: Diagnosis not present

## 2020-03-30 ENCOUNTER — Encounter: Payer: PPO | Admitting: Physical Therapy

## 2020-04-05 ENCOUNTER — Ambulatory Visit: Payer: 59 | Admitting: Physical Therapy

## 2020-04-19 ENCOUNTER — Encounter: Payer: Self-pay | Admitting: Physical Therapy

## 2020-04-19 ENCOUNTER — Other Ambulatory Visit: Payer: Self-pay

## 2020-04-19 ENCOUNTER — Ambulatory Visit: Payer: 59 | Attending: Hematology | Admitting: Physical Therapy

## 2020-04-19 DIAGNOSIS — K624 Stenosis of anus and rectum: Secondary | ICD-10-CM | POA: Diagnosis not present

## 2020-04-19 DIAGNOSIS — R279 Unspecified lack of coordination: Secondary | ICD-10-CM | POA: Insufficient documentation

## 2020-04-19 DIAGNOSIS — K430 Incisional hernia with obstruction, without gangrene: Secondary | ICD-10-CM | POA: Diagnosis not present

## 2020-04-19 DIAGNOSIS — M6281 Muscle weakness (generalized): Secondary | ICD-10-CM | POA: Diagnosis present

## 2020-04-19 DIAGNOSIS — K581 Irritable bowel syndrome with constipation: Secondary | ICD-10-CM | POA: Diagnosis not present

## 2020-04-19 DIAGNOSIS — C211 Malignant neoplasm of anal canal: Secondary | ICD-10-CM | POA: Diagnosis not present

## 2020-04-19 NOTE — Therapy (Signed)
[facility] [address], [place], [place], [address] Phone: [phone]   Fax:  [phone]  Physical Therapy Treatment Progress Note Reporting Period 01/18/20 to 04/19/20   See note below for Objective Data and Assessment of Progress/Goals.      Patient Details  Name: Cynthia Velasquez MRN: [id number] Date of Birth: April 25, 1951 Referring Provider (PT): Truitt Merle, MD   Encounter Date: 04/19/2020   PT End of Session - 04/19/20 0923    Visit Number 30    Number of Visits 40    Date for PT Re-Evaluation 05/18/20    Authorization Type medicare - needs KX!!    PT Start Time 0847    PT Stop Time 0925    PT Time Calculation (min) 38 min    Activity Tolerance Patient limited by pain    Behavior During Therapy [facility] for tasks assessed/performed           Past Medical History:  Diagnosis Date  . Anxiety   . Arthritis   . Bilateral breast cancer ([place]) 10/21/2013  . Breast cancer ([place])   . Cancer ([place])   . Depression   . Gallstones   . GERD (gastroesophageal reflux disease)    doesn't take any meds for this  . Headache    h/o migraines      . History of bladder infections   . History of hiatal hernia   . History of migraine   . Hyperlipidemia    takes Simvastatin daily  . Hypertension    takes Hyzaar  . Joint pain   . Joint swelling   . Lumbar stenosis   . Neuropathy 09/07/2013  . Pneumonia   . Pre-diabetes     Past Surgical History:  Procedure Laterality Date  . ABDOMINAL HYSTERECTOMY    . bone spur removed from left foot    . CHOLECYSTECTOMY    . COLONOSCOPY    . double mastectomy     . HERNIA REPAIR     umbilical  . right knee arthroscopy    . right shoulder arthroscopy    . surgery for hiatal hernia    . TONSILLECTOMY    . TOTAL HIP ARTHROPLASTY Left 02/12/2013  . TOTAL HIP ARTHROPLASTY Left 02/12/2013   Procedure: LEFT TOTAL HIP ARTHROPLASTY;  Surgeon: Kerin Salen, MD;  Location: [place];   Service: Orthopedics;  Laterality: Left;  . TOTAL HIP ARTHROPLASTY Right 05/03/2013   Procedure: TOTAL HIP ARTHROPLASTY;  Surgeon: Kerin Salen, MD;  Location: [place];  Service: Orthopedics;  Laterality: Right;  . TOTAL KNEE ARTHROPLASTY Right 02/27/2015   Procedure: TOTAL KNEE ARTHROPLASTY;  Surgeon: Frederik Pear, MD;  Location: [place];  Service: Orthopedics;  Laterality: Right;  . TOTAL SHOULDER ARTHROPLASTY Right 09/12/2016   Procedure: RIGHT TOTAL SHOULDER ARTHROPLASTY;  Surgeon: Tania Ade, MD;  Location: [place];  Service: Orthopedics;  Laterality: Right;  RIGHT TOTAL SHOULDER ARTHROPLASTY    There were no vitals filed for this visit.   Subjective Assessment - 04/19/20 0850    Subjective I am in pain today, the trip did not help.  I had a lot of layovers and spent overnights in the airports. My BM are smaller than a pencil size.  I have been working those muscles so much so I wouldn't have a accident.    Patient Stated Goals leakage, energy levels, being able to sleep at night    Currently in Pain? Yes    Pain Score 10-Worst pain  ever    Pain Location Back    Pain Orientation Mid;Lower    Pain Descriptors / Indicators Aching    Pain Type Chronic pain    Pain Onset More than a month ago    Aggravating Factors  sitting in the airport    Pain Relieving Factors tylenol    Multiple Pain Sites No                             [facility] Adult PT Treatment/Exercise - 04/19/20 0001      Self-Care   Other Self-Care Comments  reviewed HEP to focus on breathing and bulging      Manual Therapy   Manual therapy comments pt identity confirmed and informed consent given to perform soft tissue internally    Myofascial Release coccyx and sacral release    Internal Pelvic Floor anal sphincters and puborectalis contract/relax and stretching                    PT Short Term Goals - 11/16/19 1035      PT SHORT TERM GOAL #2   Title pt will report 30% less leakage     Status Achieved             PT Long Term Goals - 04/19/20 1128      PT LONG TERM GOAL #1   Title Pt will be able to reduce leakage and use only 1-2 pads/day    Baseline has not leaked over the last couple of weaks    Status Partially Met      PT LONG TERM GOAL #2   Title Pt will report no nocturia    Status On-going      PT LONG TERM GOAL #3   Title Pt will report she can feel the urge to urinate at least 90% of the time due to improved health of pelvic floor muscles    Baseline 50%    Status On-going      PT LONG TERM GOAL #4   Title Pt will report she can control it 80% of the time when she feels the urge    Baseline 70%    Status On-going      PT LONG TERM GOAL #5   Title Pt will be able to exercise consistently for at least 20 minutes 4-5 days/week due to improved function and energy levels    Status On-going      PT LONG TERM GOAL #6   Title Pt will report pain during urination reduced to 5/10 at most.    Status On-going                 Plan - 04/19/20 1126    Clinical Impression Statement Pt tolerated internal soft tissue but was noticeably more TTP than last session.  Pt has been clenching more since she was traveling. Pt was reminded of breathing and bulging.  She will benefit from several more sessions to work on soft tissue length and how to avoid muscle spasms in pelvic floor.    PT Treatment/Interventions ADLs/Self Care Home Management;Biofeedback;Electrical Stimulation;Cryotherapy;Moist Heat;Functional mobility training;Therapeutic activities;Therapeutic exercise;Patient/family education;Neuromuscular re-education;Taping;Dry needling;Manual techniques    PT Next Visit Plan manual to anal sphincter; scar tissue release and STM to perineum for improved sensation and endurance of pelvic floor    PT Home Exercise Plan Access Code: [email or web address]    Consulted and Agree with Plan of Care Patient  Patient will benefit from skilled therapeutic  intervention in order to improve the following deficits and impairments:  Increased muscle spasms, Impaired tone, Decreased range of motion, Decreased strength, Increased fascial restricitons, Impaired flexibility, Postural dysfunction, Pain  Visit Diagnosis: Muscle weakness (generalized)  Unspecified lack of coordination     Problem List Patient Active Problem List   Diagnosis Date Noted  . Anal cancer ([place]) 06/13/2019  . Hematuria, gross 03/11/2017  . Status post total shoulder arthroplasty, right 09/12/2016  . Arthritis of knee 02/27/2015  . Primary osteoarthritis of right knee Valgus 02/24/2015  . Neuropathy 09/07/2013  . Breast cancer of upper-outer quadrant of left female breast ([place]) 08/09/2013  . Breast cancer of upper-outer quadrant of right female breast ([place]) 08/09/2013  . Osteoarthritis of right hip 05/03/2013  . Osteoarthritis of left hip 02/15/2013    Jule Ser, PT 04/19/2020, 11:30 AM  [facility] [address] [place], [place], [address] Phone: [phone]   Fax:  [phone]  Name: Cynthia Velasquez MRN: [id number] Date of Birth: 17-May-1951

## 2020-05-02 ENCOUNTER — Encounter: Payer: Self-pay | Admitting: Physical Therapy

## 2020-05-02 ENCOUNTER — Other Ambulatory Visit: Payer: Self-pay

## 2020-05-02 ENCOUNTER — Ambulatory Visit: Payer: 59 | Admitting: Physical Therapy

## 2020-05-02 DIAGNOSIS — M6281 Muscle weakness (generalized): Secondary | ICD-10-CM

## 2020-05-02 DIAGNOSIS — R279 Unspecified lack of coordination: Secondary | ICD-10-CM

## 2020-05-02 NOTE — Therapy (Signed)
[facility] [address] [place], [place], [address] Phone: [phone]   Fax:  [phone]  Physical Therapy Treatment  Patient Details  Name: Cynthia Velasquez MRN: [id number] Date of Birth: 17-Aug-1951 Referring Provider (PT): Truitt Merle, MD   Encounter Date: 05/02/2020   PT End of Session - 05/02/20 1058    Visit Number 31    Date for PT Re-Evaluation 05/18/20    Authorization Type medicare - needs KX!!    PT Start Time [id number]    PT Stop Time 0932    PT Time Calculation (min) 46 min    Activity Tolerance Patient limited by pain    Behavior During Therapy [facility] for tasks assessed/performed           Past Medical History:  Diagnosis Date  . Anxiety   . Arthritis   . Bilateral breast cancer ([place]) 10/21/2013  . Breast cancer ([place])   . Cancer ([place])   . Depression   . Gallstones   . GERD (gastroesophageal reflux disease)    doesn't take any meds for this  . Headache    h/o migraines      . History of bladder infections   . History of hiatal hernia   . History of migraine   . Hyperlipidemia    takes Simvastatin daily  . Hypertension    takes Hyzaar  . Joint pain   . Joint swelling   . Lumbar stenosis   . Neuropathy 09/07/2013  . Pneumonia   . Pre-diabetes     Past Surgical History:  Procedure Laterality Date  . ABDOMINAL HYSTERECTOMY    . bone spur removed from left foot    . CHOLECYSTECTOMY    . COLONOSCOPY    . double mastectomy     . HERNIA REPAIR     umbilical  . right knee arthroscopy    . right shoulder arthroscopy    . surgery for hiatal hernia    . TONSILLECTOMY    . TOTAL HIP ARTHROPLASTY Left 02/12/2013  . TOTAL HIP ARTHROPLASTY Left 02/12/2013   Procedure: LEFT TOTAL HIP ARTHROPLASTY;  Surgeon: Kerin Salen, MD;  Location: [place];  Service: Orthopedics;  Laterality: Left;  . TOTAL HIP ARTHROPLASTY Right 05/03/2013   Procedure: TOTAL HIP ARTHROPLASTY;  Surgeon: Kerin Salen, MD;   Location: [place];  Service: Orthopedics;  Laterality: Right;  . TOTAL KNEE ARTHROPLASTY Right 02/27/2015   Procedure: TOTAL KNEE ARTHROPLASTY;  Surgeon: Frederik Pear, MD;  Location: [place];  Service: Orthopedics;  Laterality: Right;  . TOTAL SHOULDER ARTHROPLASTY Right 09/12/2016   Procedure: RIGHT TOTAL SHOULDER ARTHROPLASTY;  Surgeon: Tania Ade, MD;  Location: [place];  Service: Orthopedics;  Laterality: Right;  RIGHT TOTAL SHOULDER ARTHROPLASTY    There were no vitals filed for this visit.   Subjective Assessment - 05/02/20 0851    Subjective Pt states it was very painful when she went to the doctor last week when she had rectal exam.  States she is supposed to increase meds for having a BM.  States she had accident with coughing and sneezing.    Patient Stated Goals leakage, energy levels, being able to sleep at night    Currently in Pain? Yes    Pain Score 5     Pain Location Back    Pain Orientation Lower    Pain Descriptors / Indicators Aching    Pain Type Chronic pain    Multiple Pain Sites No  [place] Adult PT Treatment/Exercise - 05/02/20 0001      Lumbar Exercises: Aerobic   Nustep L2 x 10 min warm up and PT present for status      Manual Therapy   Myofascial Release MFR throughout abdomen                    PT Short Term Goals - 11/16/19 1035      PT SHORT TERM GOAL #2   Title pt will report 30% less leakage    Status Achieved             PT Long Term Goals - 04/19/20 1128      PT LONG TERM GOAL #1   Title Pt will be able to reduce leakage and use only 1-2 pads/day    Baseline has not leaked over the last couple of weaks    Status Partially Met      PT LONG TERM GOAL #2   Title Pt will report no nocturia    Status On-going      PT LONG TERM GOAL #3   Title Pt will report she can feel the urge to urinate at least 90% of the time due to improved health of pelvic floor muscles    Baseline 50%     Status On-going      PT LONG TERM GOAL #4   Title Pt will report she can control it 80% of the time when she feels the urge    Baseline 70%    Status On-going      PT LONG TERM GOAL #5   Title Pt will be able to exercise consistently for at least 20 minutes 4-5 days/week due to improved function and energy levels    Status On-going      PT LONG TERM GOAL #6   Title Pt will report pain during urination reduced to 5/10 at most.    Status On-going                 Plan - 05/02/20 0933    Clinical Impression Statement Pt had lots of bowel sounds with MFR today.  Pt states that the PT has helped a lot overall, but still having constipation followed by accidents.  Things have been inconsistent with her medications so it has been hard to see recent progress.  Continue with PT    PT Treatment/Interventions ADLs/Self Care Home Management;Biofeedback;Electrical Stimulation;Cryotherapy;Moist Heat;Functional mobility training;Therapeutic activities;Therapeutic exercise;Patient/family education;Neuromuscular re-education;Taping;Dry needling;Manual techniques    PT Next Visit Plan manual to anal sphincter; scar tissue release and STM to perineum for improved sensation and endurance of pelvic floor    PT Home Exercise Plan Access Code: [email or web address]    Consulted and Agree with Plan of Care Patient           Patient will benefit from skilled therapeutic intervention in order to improve the following deficits and impairments:  Increased muscle spasms, Impaired tone, Decreased range of motion, Decreased strength, Increased fascial restricitons, Impaired flexibility, Postural dysfunction, Pain  Visit Diagnosis: Muscle weakness (generalized)  Unspecified lack of coordination     Problem List Patient Active Problem List   Diagnosis Date Noted  . Anal cancer ([place]) 06/13/2019  . Hematuria, gross 03/11/2017  . Status post total shoulder arthroplasty, right 09/12/2016  . Arthritis of knee  02/27/2015  . Primary osteoarthritis of right knee Valgus 02/24/2015  . Neuropathy 09/07/2013  . Breast cancer of upper-outer quadrant of left female breast ([place]) 08/09/2013  .  Breast cancer of upper-outer quadrant of right female breast ([place]) 08/09/2013  . Osteoarthritis of right hip 05/03/2013  . Osteoarthritis of left hip 02/15/2013    Jule Ser, PT 05/02/2020, 11:00 AM  [facility] [address] [place], [place], [address] Phone: [phone]   Fax:  [phone]  Name: Cynthia Velasquez MRN: [id number] Date of Birth: 04-19-1951

## 2020-05-05 ENCOUNTER — Other Ambulatory Visit: Payer: Self-pay

## 2020-05-05 ENCOUNTER — Ambulatory Visit: Payer: 59 | Admitting: Physical Therapy

## 2020-05-05 DIAGNOSIS — R279 Unspecified lack of coordination: Secondary | ICD-10-CM

## 2020-05-05 DIAGNOSIS — M6281 Muscle weakness (generalized): Secondary | ICD-10-CM | POA: Diagnosis not present

## 2020-05-05 NOTE — Therapy (Signed)
[facility] [address] [place], [place], [address] Phone: [phone]   Fax:  [phone]  Physical Therapy Treatment  Patient Details  Name: Cynthia Velasquez MRN: [id number] Date of Birth: 08-20-1951 Referring Provider (PT): Truitt Merle, MD   Encounter Date: 05/05/2020   PT End of Session - 05/05/20 0900    Visit Number 32    Number of Visits 40    Date for PT Re-Evaluation 05/18/20    Authorization Type medicare - needs KX!!    PT Start Time [phone]    PT Stop Time 0930    PT Time Calculation (min) 41 min    Activity Tolerance Patient limited by pain;Patient tolerated treatment well    Behavior During Therapy [facility] for tasks assessed/performed           Past Medical History:  Diagnosis Date  . Anxiety   . Arthritis   . Bilateral breast cancer ([place]) 10/21/2013  . Breast cancer ([place])   . Cancer ([place])   . Depression   . Gallstones   . GERD (gastroesophageal reflux disease)    doesn't take any meds for this  . Headache    h/o migraines      . History of bladder infections   . History of hiatal hernia   . History of migraine   . Hyperlipidemia    takes Simvastatin daily  . Hypertension    takes Hyzaar  . Joint pain   . Joint swelling   . Lumbar stenosis   . Neuropathy 09/07/2013  . Pneumonia   . Pre-diabetes     Past Surgical History:  Procedure Laterality Date  . ABDOMINAL HYSTERECTOMY    . bone spur removed from left foot    . CHOLECYSTECTOMY    . COLONOSCOPY    . double mastectomy     . HERNIA REPAIR     umbilical  . right knee arthroscopy    . right shoulder arthroscopy    . surgery for hiatal hernia    . TONSILLECTOMY    . TOTAL HIP ARTHROPLASTY Left 02/12/2013  . TOTAL HIP ARTHROPLASTY Left 02/12/2013   Procedure: LEFT TOTAL HIP ARTHROPLASTY;  Surgeon: Kerin Salen, MD;  Location: [place];  Service: Orthopedics;  Laterality: Left;  . TOTAL HIP ARTHROPLASTY Right 05/03/2013   Procedure: TOTAL  HIP ARTHROPLASTY;  Surgeon: Kerin Salen, MD;  Location: [place];  Service: Orthopedics;  Laterality: Right;  . TOTAL KNEE ARTHROPLASTY Right 02/27/2015   Procedure: TOTAL KNEE ARTHROPLASTY;  Surgeon: Frederik Pear, MD;  Location: [place];  Service: Orthopedics;  Laterality: Right;  . TOTAL SHOULDER ARTHROPLASTY Right 09/12/2016   Procedure: RIGHT TOTAL SHOULDER ARTHROPLASTY;  Surgeon: Tania Ade, MD;  Location: [place];  Service: Orthopedics;  Laterality: Right;  RIGHT TOTAL SHOULDER ARTHROPLASTY    There were no vitals filed for this visit.   Subjective Assessment - 05/05/20 0854    Subjective Pt states she had a regular BM after previous session.  Pt had watermelon and strawberries last night and believes that causes a runny stool. Pt is having back pain today, no number given but is not hurting currently when lying down. She ambulated into clinic with her [place].    Patient Stated Goals leakage, energy levels, being able to sleep at night    Currently in Pain? No/denies  [place] Adult PT Treatment/Exercise - 05/05/20 0001      Self-Care   Other Self-Care Comments  discussed how to reduce muscle cramps and importance of hydration       Manual Therapy   Soft tissue mobilization supine    Myofascial Release MFR throughout abdomen; diaphragm and pelvic ring including traction to sacrum                     PT Short Term Goals - 11/16/19 1035      PT SHORT TERM GOAL #2   Title pt will report 30% less leakage    Status Achieved             PT Long Term Goals - 04/19/20 1128      PT LONG TERM GOAL #1   Title Pt will be able to reduce leakage and use only 1-2 pads/day    Baseline has not leaked over the last couple of weaks    Status Partially Met      PT LONG TERM GOAL #2   Title Pt will report no nocturia    Status On-going      PT LONG TERM GOAL #3   Title Pt will report she can feel the urge to urinate at least 90% of the  time due to improved health of pelvic floor muscles    Baseline 50%    Status On-going      PT LONG TERM GOAL #4   Title Pt will report she can control it 80% of the time when she feels the urge    Baseline 70%    Status On-going      PT LONG TERM GOAL #5   Title Pt will be able to exercise consistently for at least 20 minutes 4-5 days/week due to improved function and energy levels    Status On-going      PT LONG TERM GOAL #6   Title Pt will report pain during urination reduced to 5/10 at most.    Status On-going                 Plan - 05/05/20 0943    Clinical Impression Statement Today's session continued to focus on fascial release adding release around the diaphragm and pelvic ring.  Pt report she had imporved BM after PT last session.  She is still noticing eating certain foods causing runny stool and going out will have intense urgency, but no leakage this last week.  Pt will benefit from skilled PT to work on improved soft tissue of pelvic floor next.    PT Treatment/Interventions ADLs/Self Care Home Management;Biofeedback;Electrical Stimulation;Cryotherapy;Moist Heat;Functional mobility training;Therapeutic activities;Therapeutic exercise;Patient/family education;Neuromuscular re-education;Taping;Dry needling;Manual techniques    PT Next Visit Plan manual to anal sphincter; scar tissue release and STM to perineum for improved sensation and endurance of pelvic floor    PT Home Exercise Plan Access Code: [email or web address]    Consulted and Agree with Plan of Care Patient           Patient will benefit from skilled therapeutic intervention in order to improve the following deficits and impairments:  Increased muscle spasms, Impaired tone, Decreased range of motion, Decreased strength, Increased fascial restricitons, Impaired flexibility, Postural dysfunction, Pain  Visit Diagnosis: Muscle weakness (generalized)  Unspecified lack of coordination     Problem List Patient  Active Problem List   Diagnosis Date Noted  . Anal cancer ([place]) 06/13/2019  . Hematuria, gross 03/11/2017  . Status post total  shoulder arthroplasty, right 09/12/2016  . Arthritis of knee 02/27/2015  . Primary osteoarthritis of right knee Valgus 02/24/2015  . Neuropathy 09/07/2013  . Breast cancer of upper-outer quadrant of left female breast ([place]) 08/09/2013  . Breast cancer of upper-outer quadrant of right female breast ([place]) 08/09/2013  . Osteoarthritis of right hip 05/03/2013  . Osteoarthritis of left hip 02/15/2013    Jule Ser, PT 05/05/2020, 9:50 AM  [facility] [address] [place], [place], [address] Phone: [phone]   Fax:  [phone]  Name: Tracy Gerken MRN: [id number] Date of Birth: 01/30/1951

## 2020-05-09 ENCOUNTER — Other Ambulatory Visit: Payer: Self-pay

## 2020-05-09 ENCOUNTER — Ambulatory Visit: Payer: 59 | Admitting: Physical Therapy

## 2020-05-09 DIAGNOSIS — M6281 Muscle weakness (generalized): Secondary | ICD-10-CM

## 2020-05-09 DIAGNOSIS — R279 Unspecified lack of coordination: Secondary | ICD-10-CM

## 2020-05-09 NOTE — Therapy (Signed)
[facility] [address], [place], [place], [address] Phone: [phone]   Fax:  [phone]  Physical Therapy Treatment  Patient Details  Name: Cynthia Velasquez MRN: [id number] Date of Birth: 01-14-1951 Referring Provider (PT): Truitt Merle, MD   Encounter Date: 05/09/2020   PT End of Session - 05/09/20 1401    Visit Number 33    Date for PT Re-Evaluation 05/18/20    Authorization Type medicare - needs KX!!    PT Start Time [phone]    PT Stop Time 0930    PT Time Calculation (min) 43 min    Activity Tolerance Patient tolerated treatment well    Behavior During Therapy [facility] for tasks assessed/performed           Past Medical History:  Diagnosis Date   Anxiety    Arthritis    Bilateral breast cancer ([place]) 10/21/2013   Breast cancer ([place])    Cancer ([place])    Depression    Gallstones    GERD (gastroesophageal reflux disease)    doesn't take any meds for this   Headache    h/o migraines       History of bladder infections    History of hiatal hernia    History of migraine    Hyperlipidemia    takes Simvastatin daily   Hypertension    takes Hyzaar   Joint pain    Joint swelling    Lumbar stenosis    Neuropathy 09/07/2013   Pneumonia    Pre-diabetes     Past Surgical History:  Procedure Laterality Date   ABDOMINAL HYSTERECTOMY     bone spur removed from left foot     CHOLECYSTECTOMY     COLONOSCOPY     double mastectomy      HERNIA REPAIR     umbilical   right knee arthroscopy     right shoulder arthroscopy     surgery for hiatal hernia     TONSILLECTOMY     TOTAL HIP ARTHROPLASTY Left 02/12/2013   TOTAL HIP ARTHROPLASTY Left 02/12/2013   Procedure: LEFT TOTAL HIP ARTHROPLASTY;  Surgeon: Cynthia Salen, MD;  Location: [place];  Service: Orthopedics;  Laterality: Left;   TOTAL HIP ARTHROPLASTY Right 05/03/2013   Procedure: TOTAL HIP ARTHROPLASTY;  Surgeon: Cynthia Salen, MD;   Location: [place];  Service: Orthopedics;  Laterality: Right;   TOTAL KNEE ARTHROPLASTY Right 02/27/2015   Procedure: TOTAL KNEE ARTHROPLASTY;  Surgeon: Frederik Pear, MD;  Location: [place];  Service: Orthopedics;  Laterality: Right;   TOTAL SHOULDER ARTHROPLASTY Right 09/12/2016   Procedure: RIGHT TOTAL SHOULDER ARTHROPLASTY;  Surgeon: Tania Ade, MD;  Location: [place];  Service: Orthopedics;  Laterality: Right;  RIGHT TOTAL SHOULDER ARTHROPLASTY    There were no vitals filed for this visit.   Subjective Assessment - 05/09/20 1402    Subjective My back has been bothering me a lot.    Patient Stated Goals leakage, energy levels, being able to sleep at night    Currently in Pain? No/denies   no pain at rest just walking                            [facility] Adult PT Treatment/Exercise - 05/09/20 0001      Manual Therapy   Manual therapy comments pt identity confirmed and informed consent given to perform soft tissue internally    Soft tissue mobilization supine  Internal Pelvic Floor bulbocavernosis and transvers peroneus fascial release            Trigger Point Dry Needling - 05/09/20 0001    Consent Given? Yes    Education Handout Provided Previously provided    Gluteus Minimus Response Palpable increased muscle length    Gluteus Medius Response Palpable increased muscle length                  PT Short Term Goals - 11/16/19 1035      PT SHORT TERM GOAL #2   Title pt will report 30% less leakage    Status Achieved             PT Long Term Goals - 04/19/20 1128      PT LONG TERM GOAL #1   Title Pt will be able to reduce leakage and use only 1-2 pads/day    Baseline has not leaked over the last couple of weaks    Status Partially Met      PT LONG TERM GOAL #2   Title Pt will report no nocturia    Status On-going      PT LONG TERM GOAL #3   Title Pt will report she can feel the urge to urinate at least 90% of the time due to improved  health of pelvic floor muscles    Baseline 50%    Status On-going      PT LONG TERM GOAL #4   Title Pt will report she can control it 80% of the time when she feels the urge    Baseline 70%    Status On-going      PT LONG TERM GOAL #5   Title Pt will be able to exercise consistently for at least 20 minutes 4-5 days/week due to improved function and energy levels    Status On-going      PT LONG TERM GOAL #6   Title Pt will report pain during urination reduced to 5/10 at most.    Status On-going                 Plan - 05/09/20 1447    Clinical Impression Statement Pt tolerated the internal STM fairly well, no pain afterwards.  Pt able to tolerate to bulbocavernosis about to first knuckle and gentle pressure in all directions.  Pt is not able to do this for herself.  Pt responded well to STM and dry needling to the gluteals On Lt side to release fascia for improved soft tissue length.  Pt had decreased pain in the leg after PT today.  She will benefit from skilled PT to progress pelvic floor strength and improved control.    PT Treatment/Interventions ADLs/Self Care Home Management;Biofeedback;Electrical Stimulation;Cryotherapy;Moist Heat;Functional mobility training;Therapeutic activities;Therapeutic exercise;Patient/family education;Neuromuscular re-education;Taping;Dry needling;Manual techniques    PT Next Visit Plan manual to anal sphincter; scar tissue release and STM to perineum for improved sensation and endurance of pelvic floor    PT Home Exercise Plan Access Code: [email or web address]    Consulted and Agree with Plan of Care Patient           Patient will benefit from skilled therapeutic intervention in order to improve the following deficits and impairments:  Increased muscle spasms, Impaired tone, Decreased range of motion, Decreased strength, Increased fascial restricitons, Impaired flexibility, Postural dysfunction, Pain  Visit Diagnosis: Muscle weakness  (generalized)  Unspecified lack of coordination     Problem List Patient Active Problem List   Diagnosis Date  Noted   Anal cancer ([place]) 06/13/2019   Hematuria, gross 03/11/2017   Status post total shoulder arthroplasty, right 09/12/2016   Arthritis of knee 02/27/2015   Primary osteoarthritis of right knee Valgus 02/24/2015   Neuropathy 09/07/2013   Breast cancer of upper-outer quadrant of left female breast ([place]) 08/09/2013   Breast cancer of upper-outer quadrant of right female breast ([place]) 08/09/2013   Osteoarthritis of right hip 05/03/2013   Osteoarthritis of left hip 02/15/2013    Jule Ser, PT 05/09/2020, 3:14 PM  [facility] [address], [place], [place], [address] Phone: [phone]   Fax:  [phone]  Name: Cynthia Velasquez MRN: [id number] Date of Birth: 1951/02/20

## 2020-05-11 ENCOUNTER — Encounter: Payer: Self-pay | Admitting: Hematology

## 2020-05-12 ENCOUNTER — Ambulatory Visit: Payer: 59 | Admitting: Physical Therapy

## 2020-05-12 ENCOUNTER — Other Ambulatory Visit: Payer: Self-pay

## 2020-05-12 ENCOUNTER — Encounter: Payer: Self-pay | Admitting: Physical Therapy

## 2020-05-12 DIAGNOSIS — M6281 Muscle weakness (generalized): Secondary | ICD-10-CM | POA: Diagnosis not present

## 2020-05-12 DIAGNOSIS — R279 Unspecified lack of coordination: Secondary | ICD-10-CM

## 2020-05-12 NOTE — Therapy (Signed)
[facility] [address] [place], [place], [address] Phone: [phone]   Fax:  [phone]  Physical Therapy Treatment  Patient Details  Name: Cynthia Velasquez MRN: [id number] Date of Birth: [birth year] Referring Provider (PT): Truitt Merle, MD   Encounter Date: 05/12/2020   PT End of Session - 05/12/20 0851    Visit Number 34    Number of Visits 40    Date for PT Re-Evaluation 05/18/20    Authorization Type medicare - needs KX!!    PT Start Time 0847    PT Stop Time 0927    PT Time Calculation (min) 40 min    Activity Tolerance Patient tolerated treatment well    Behavior During Therapy [facility] for tasks assessed/performed           Past Medical History:  Diagnosis Date   Anxiety    Arthritis    Bilateral breast cancer ([place]) 10/21/2013   Breast cancer ([place])    Cancer ([place])    Depression    Gallstones    GERD (gastroesophageal reflux disease)    doesn't take any meds for this   Headache    h/o migraines       History of bladder infections    History of hiatal hernia    History of migraine    Hyperlipidemia    takes Simvastatin daily   Hypertension    takes Hyzaar   Joint pain    Joint swelling    Lumbar stenosis    Neuropathy 09/07/2013   Pneumonia    Pre-diabetes     Past Surgical History:  Procedure Laterality Date   ABDOMINAL HYSTERECTOMY     bone spur removed from left foot     CHOLECYSTECTOMY     COLONOSCOPY     double mastectomy      HERNIA REPAIR     umbilical   right knee arthroscopy     right shoulder arthroscopy     surgery for hiatal hernia     TONSILLECTOMY     TOTAL HIP ARTHROPLASTY Left 02/12/2013   TOTAL HIP ARTHROPLASTY Left 02/12/2013   Procedure: LEFT TOTAL HIP ARTHROPLASTY;  Surgeon: Kerin Salen, MD;  Location: [place];  Service: Orthopedics;  Laterality: Left;   TOTAL HIP ARTHROPLASTY Right 05/03/2013   Procedure: TOTAL HIP ARTHROPLASTY;   Surgeon: Kerin Salen, MD;  Location: [place];  Service: Orthopedics;  Laterality: Right;   TOTAL KNEE ARTHROPLASTY Right 02/27/2015   Procedure: TOTAL KNEE ARTHROPLASTY;  Surgeon: Frederik Pear, MD;  Location: [place];  Service: Orthopedics;  Laterality: Right;   TOTAL SHOULDER ARTHROPLASTY Right 09/12/2016   Procedure: RIGHT TOTAL SHOULDER ARTHROPLASTY;  Surgeon: Tania Ade, MD;  Location: [place];  Service: Orthopedics;  Laterality: Right;  RIGHT TOTAL SHOULDER ARTHROPLASTY    There were no vitals filed for this visit.   Subjective Assessment - 05/12/20 0854    Subjective My hip has been feeling a lot better and patient presents to clinic without [place] today. Reports pain in certain positions and movements that is higher and more centralized today.                             [facility] Adult PT Treatment/Exercise - 05/12/20 0001      Lumbar Exercises: Stretches   Other Lumbar Stretch Exercise lumbar ext at wall; flexion in seated - 10x 5 sec      Manual Therapy  Soft tissue mobilization Lt gluteals and lumbar paraspinals            Trigger Point Dry Needling - 05/12/20 0001    Other Dry Needling lumbar L4/5 not all the way down to multifidi    Gluteus Minimus Response Palpable increased muscle length    Gluteus Medius Response Palpable increased muscle length                  PT Short Term Goals - 11/16/19 1035      PT SHORT TERM GOAL #2   Title pt will report 30% less leakage    Status Achieved             PT Long Term Goals - 04/19/20 1128      PT LONG TERM GOAL #1   Title Pt will be able to reduce leakage and use only 1-2 pads/day    Baseline has not leaked over the last couple of weaks    Status Partially Met      PT LONG TERM GOAL #2   Title Pt will report no nocturia    Status On-going      PT LONG TERM GOAL #3   Title Pt will report she can feel the urge to urinate at least 90% of the time due to improved health of pelvic floor  muscles    Baseline 50%    Status On-going      PT LONG TERM GOAL #4   Title Pt will report she can control it 80% of the time when she feels the urge    Baseline 70%    Status On-going      PT LONG TERM GOAL #5   Title Pt will be able to exercise consistently for at least 20 minutes 4-5 days/week due to improved function and energy levels    Status On-going      PT LONG TERM GOAL #6   Title Pt will report pain during urination reduced to 5/10 at most.    Status On-going                 Plan - 05/12/20 0930    Clinical Impression Statement Pt responded well to STM and dry needling last session so PT continued to work on pain management as it seems to be helping her pelvic floor function.  Pt was able to get up from the table after treatment without pain which she hasn't be able to do for a long time.  Pt will benefit from skilled PT to continue to work on strength and pain managment for improved funciton.    PT Treatment/Interventions ADLs/Self Care Home Management;Biofeedback;Electrical Stimulation;Cryotherapy;Moist Heat;Functional mobility training;Therapeutic activities;Therapeutic exercise;Patient/family education;Neuromuscular re-education;Taping;Dry needling;Manual techniques    PT Next Visit Plan DN as needed; manual to anal sphincter; scar tissue release and STM to perineum for improved sensation and endurance of pelvic floor    PT Home Exercise Plan Access Code: [email or web address]    Consulted and Agree with Plan of Care Patient           Patient will benefit from skilled therapeutic intervention in order to improve the following deficits and impairments:  Increased muscle spasms, Impaired tone, Decreased range of motion, Decreased strength, Increased fascial restricitons, Impaired flexibility, Postural dysfunction, Pain  Visit Diagnosis: Muscle weakness (generalized)  Unspecified lack of coordination     Problem List Patient Active Problem List   Diagnosis Date Noted    Anal cancer ([place]) 06/13/2019   Hematuria, gross  03/11/2017   Status post total shoulder arthroplasty, right 09/12/2016   Arthritis of knee 02/27/2015   Primary osteoarthritis of right knee Valgus 02/24/2015   Neuropathy 09/07/2013   Breast cancer of upper-outer quadrant of left female breast ([place]) 08/09/2013   Breast cancer of upper-outer quadrant of right female breast ([place]) 08/09/2013   Osteoarthritis of right hip 05/03/2013   Osteoarthritis of left hip 02/15/2013    Jule Ser, PT 05/12/2020, 9:35 AM  [facility] [address] [place], [place], [address] Phone: [phone]   Fax:  [phone]  Name: Cynthia Velasquez MRN: [id number] Date of Birth: Jul 29, 1951

## 2020-05-16 ENCOUNTER — Other Ambulatory Visit: Payer: Self-pay

## 2020-05-16 ENCOUNTER — Encounter: Payer: Self-pay | Admitting: Physical Therapy

## 2020-05-16 ENCOUNTER — Ambulatory Visit: Payer: 59 | Admitting: Physical Therapy

## 2020-05-16 DIAGNOSIS — R279 Unspecified lack of coordination: Secondary | ICD-10-CM

## 2020-05-16 DIAGNOSIS — M6281 Muscle weakness (generalized): Secondary | ICD-10-CM

## 2020-05-16 NOTE — Therapy (Signed)
[facility] [address] [place], [place], [address] Phone: [phone]   Fax:  [phone]  Physical Therapy Treatment  Patient Details  Name: Cynthia Velasquez MRN: [id number] Date of Birth: [birth year] Referring Provider (PT): Truitt Merle, MD   Encounter Date: 05/16/2020   PT End of Session - 05/16/20 0901    Visit Number 35    Date for PT Re-Evaluation 05/18/20    Authorization Type medicare - needs KX!!    PT Start Time [phone]    PT Stop Time 0926    PT Time Calculation (min) 40 min    Activity Tolerance Patient tolerated treatment well    Behavior During Therapy [facility] for tasks assessed/performed           Past Medical History:  Diagnosis Date  . Anxiety   . Arthritis   . Bilateral breast cancer ([place]) 10/21/2013  . Breast cancer ([place])   . Cancer ([place])   . Depression   . Gallstones   . GERD (gastroesophageal reflux disease)    doesn't take any meds for this  . Headache    h/o migraines      . History of bladder infections   . History of hiatal hernia   . History of migraine   . Hyperlipidemia    takes Simvastatin daily  . Hypertension    takes Hyzaar  . Joint pain   . Joint swelling   . Lumbar stenosis   . Neuropathy 09/07/2013  . Pneumonia   . Pre-diabetes     Past Surgical History:  Procedure Laterality Date  . ABDOMINAL HYSTERECTOMY    . bone spur removed from left foot    . CHOLECYSTECTOMY    . COLONOSCOPY    . double mastectomy     . HERNIA REPAIR     umbilical  . right knee arthroscopy    . right shoulder arthroscopy    . surgery for hiatal hernia    . TONSILLECTOMY    . TOTAL HIP ARTHROPLASTY Left 02/12/2013  . TOTAL HIP ARTHROPLASTY Left 02/12/2013   Procedure: LEFT TOTAL HIP ARTHROPLASTY;  Surgeon: Kerin Salen, MD;  Location: [place];  Service: Orthopedics;  Laterality: Left;  . TOTAL HIP ARTHROPLASTY Right 05/03/2013   Procedure: TOTAL HIP ARTHROPLASTY;  Surgeon: Kerin Salen, MD;   Location: [place];  Service: Orthopedics;  Laterality: Right;  . TOTAL KNEE ARTHROPLASTY Right 02/27/2015   Procedure: TOTAL KNEE ARTHROPLASTY;  Surgeon: Frederik Pear, MD;  Location: [place];  Service: Orthopedics;  Laterality: Right;  . TOTAL SHOULDER ARTHROPLASTY Right 09/12/2016   Procedure: RIGHT TOTAL SHOULDER ARTHROPLASTY;  Surgeon: Tania Ade, MD;  Location: [place];  Service: Orthopedics;  Laterality: Right;  RIGHT TOTAL SHOULDER ARTHROPLASTY    There were no vitals filed for this visit.   Subjective Assessment - 05/16/20 0855    Subjective The back is a lot better.  I had 3 BM/ day.  I was able to hold it until I got to the bathroom much better.    Patient Stated Goals leakage, energy levels, being able to sleep at night    Currently in Pain? No/denies                             [facility] Adult PT Treatment/Exercise - 05/16/20 0001      Lumbar Exercises: Aerobic   UBE (Upper Arm Bike) L1 3/3 - fwd back - PT  present for status update      Lumbar Exercises: Standing   Row Strengthening;Both;20 reps;Theraband    Theraband Level (Row) Level 4 (Blue)    Shoulder Extension Strengthening;Right;Left;20 reps;Theraband    Theraband Level (Shoulder Extension) Level 3 (Green)    Other Standing Lumbar Exercises isometric flexion  walk out      Lumbar Exercises: Seated   Sit to Stand 15 reps    Sit to Stand Limitations VC/TC for knee position - ER hips isometrically      Lumbar Exercises: Supine   Clam 20 reps    Bent Knee Raise 20 reps    Other Supine Lumbar Exercises bent knee fallout      Lumbar Exercises: Sidelying   Clam Left;15 reps                  PT Education - 05/16/20 1049    Education Details updates to [facility]    Methods Explanation;Demonstration;Tactile cues;Verbal cues;Handout    Comprehension Verbalized understanding;Returned demonstration            PT Short Term Goals - 11/16/19 1035      PT SHORT TERM GOAL #2   Title pt will  report 30% less leakage    Status Achieved             PT Long Term Goals - 04/19/20 1128      PT LONG TERM GOAL #1   Title Pt will be able to reduce leakage and use only 1-2 pads/day    Baseline has not leaked over the last couple of weaks    Status Partially Met      PT LONG TERM GOAL #2   Title Pt will report no nocturia    Status On-going      PT LONG TERM GOAL #3   Title Pt will report she can feel the urge to urinate at least 90% of the time due to improved health of pelvic floor muscles    Baseline 50%    Status On-going      PT LONG TERM GOAL #4   Title Pt will report she can control it 80% of the time when she feels the urge    Baseline 70%    Status On-going      PT LONG TERM GOAL #5   Title Pt will be able to exercise consistently for at least 20 minutes 4-5 days/week due to improved function and energy levels    Status On-going      PT LONG TERM GOAL #6   Title Pt will report pain during urination reduced to 5/10 at most.    Status On-going                 Plan - 05/16/20 0950    Clinical Impression Statement Pt was doing much better today with reduced back pain.  She was able to work on strengthening and no increased low back pain.  Pt did have some pain on the right hip with right side lying.  She needed cues during sit to standing to keep knees apart.  Pt will benefit from skilled PT to increase core and hip strength.    PT Treatment/Interventions ADLs/Self Care Home Management;Biofeedback;Electrical Stimulation;Cryotherapy;Moist Heat;Functional mobility training;Therapeutic activities;Therapeutic exercise;Patient/family education;Neuromuscular re-education;Taping;Dry needling;Manual techniques    PT Next Visit Plan re-eval; DN as needed; manual to anal sphincter; scar tissue release and STM to perineum for improved sensation and endurance of pelvic floor    PT Home Exercise  Plan Access Code: [email or web address]    [email or web address] and Agree with Plan of Care Patient            Patient will benefit from skilled therapeutic intervention in order to improve the following deficits and impairments:  Increased muscle spasms, Impaired tone, Decreased range of motion, Decreased strength, Increased fascial restricitons, Impaired flexibility, Postural dysfunction, Pain  Visit Diagnosis: Muscle weakness (generalized)  Unspecified lack of coordination     Problem List Patient Active Problem List   Diagnosis Date Noted  . Anal cancer ([place]) 06/13/2019  . Hematuria, gross 03/11/2017  . Status post total shoulder arthroplasty, right 09/12/2016  . Arthritis of knee 02/27/2015  . Primary osteoarthritis of right knee Valgus 02/24/2015  . Neuropathy 09/07/2013  . Breast cancer of upper-outer quadrant of left female breast ([place]) 08/09/2013  . Breast cancer of upper-outer quadrant of right female breast ([place]) 08/09/2013  . Osteoarthritis of right hip 05/03/2013  . Osteoarthritis of left hip 02/15/2013    Jule Ser, PT 05/16/2020, 10:49 AM  [facility] [address] [place], [place], [address] Phone: [phone]   Fax:  [phone]  Name: Koi Yarbro MRN: [id number] Date of Birth: [birth year]

## 2020-05-19 ENCOUNTER — Other Ambulatory Visit: Payer: Self-pay

## 2020-05-19 ENCOUNTER — Ambulatory Visit: Payer: PPO | Attending: Hematology | Admitting: Physical Therapy

## 2020-05-19 ENCOUNTER — Encounter: Payer: Self-pay | Admitting: Physical Therapy

## 2020-05-19 DIAGNOSIS — M6281 Muscle weakness (generalized): Secondary | ICD-10-CM | POA: Insufficient documentation

## 2020-05-19 DIAGNOSIS — R279 Unspecified lack of coordination: Secondary | ICD-10-CM | POA: Diagnosis not present

## 2020-05-19 NOTE — Therapy (Signed)
[facility] [address], [place], [place], [address] Phone: [phone]   Fax:  [phone]  Physical Therapy Treatment  Patient Details  Name: Cynthia Velasquez MRN: [id number] Date of Birth: [birth year] Referring Provider (PT): Truitt Merle, MD   Encounter Date: 05/19/2020   PT End of Session - 05/19/20 0853    Visit Number 36    Number of Visits 40    Date for PT Re-Evaluation 07/14/20    Authorization Type medicare - needs KX!!    PT Start Time 0845    PT Stop Time 0930    PT Time Calculation (min) 45 min    Activity Tolerance Patient tolerated treatment well    Behavior During Therapy [facility] for tasks assessed/performed           Past Medical History:  Diagnosis Date   Anxiety    Arthritis    Bilateral breast cancer ([place]) 10/21/2013   Breast cancer ([place])    Cancer ([place])    Depression    Gallstones    GERD (gastroesophageal reflux disease)    doesn't take any meds for this   Headache    h/o migraines       History of bladder infections    History of hiatal hernia    History of migraine    Hyperlipidemia    takes Simvastatin daily   Hypertension    takes Hyzaar   Joint pain    Joint swelling    Lumbar stenosis    Neuropathy 09/07/2013   Pneumonia    Pre-diabetes     Past Surgical History:  Procedure Laterality Date   ABDOMINAL HYSTERECTOMY     bone spur removed from left foot     CHOLECYSTECTOMY     COLONOSCOPY     double mastectomy      HERNIA REPAIR     umbilical   right knee arthroscopy     right shoulder arthroscopy     surgery for hiatal hernia     TONSILLECTOMY     TOTAL HIP ARTHROPLASTY Left 02/12/2013   TOTAL HIP ARTHROPLASTY Left 02/12/2013   Procedure: LEFT TOTAL HIP ARTHROPLASTY;  Surgeon: Kerin Salen, MD;  Location: [place];  Service: Orthopedics;  Laterality: Left;   TOTAL HIP ARTHROPLASTY Right 05/03/2013   Procedure: TOTAL HIP ARTHROPLASTY;   Surgeon: Kerin Salen, MD;  Location: [place];  Service: Orthopedics;  Laterality: Right;   TOTAL KNEE ARTHROPLASTY Right 02/27/2015   Procedure: TOTAL KNEE ARTHROPLASTY;  Surgeon: Frederik Pear, MD;  Location: [place];  Service: Orthopedics;  Laterality: Right;   TOTAL SHOULDER ARTHROPLASTY Right 09/12/2016   Procedure: RIGHT TOTAL SHOULDER ARTHROPLASTY;  Surgeon: Tania Ade, MD;  Location: [place];  Service: Orthopedics;  Laterality: Right;  RIGHT TOTAL SHOULDER ARTHROPLASTY    There were no vitals filed for this visit.   Subjective Assessment - 05/19/20 0848    Subjective I had to go very bad last time, but was able to make it home so I held it for 30 minutes.    Limitations Standing;Sitting    Patient Stated Goals leakage, energy levels, being able to sleep at night              [facility] PT Assessment - 05/19/20 0001      Assessment   Medical Diagnosis C21.0 (ICD-10-CM) - Anal cancer [facility])    Referring Provider (PT) Truitt Merle, MD      Posture/Postural Control   Postural Limitations Increased  thoracic kyphosis;Increased lumbar lordosis      Strength   Right Hip Flexion 5/5    Right Hip Extension 4/5    Right Hip ABduction 4-/5    Right Hip ADduction 4+/5    Left Hip Flexion 4/5    Left Hip Extension 3+/5    Left Hip ABduction 3/5    Left Hip ADduction 4/5                         [facility] Adult PT Treatment/Exercise - 05/19/20 0001      Lumbar Exercises: Machines for Strengthening   Leg Press single leg seat 8; 30lb 3 x 10      Lumbar Exercises: Seated   Sit to Stand 15 reps    Sit to Stand Limitations VC/TC for knee position - ER hips isometrically and to keep weight even bilat ; from elevated surface      Manual Therapy   Myofascial Release bilat gluteals and lumbar paraspinals in sidelying            Trigger Point Dry Needling - 05/19/20 0001    Consent Given? Yes    Education Handout Provided Previously provided    Muscles Treated Back/Hip  Lumbar multifidi    Gluteus Minimus Response Palpable increased muscle length    Gluteus Medius Response Palpable increased muscle length    Lumbar multifidi Response Twitch response elicited;Palpable increased muscle length                  PT Short Term Goals - 11/16/19 1035      PT SHORT TERM GOAL #2   Title pt will report 30% less leakage    Status Achieved             PT Long Term Goals - 05/19/20 0927      PT LONG TERM GOAL #1   Title Pt will be able to reduce leakage and use only 1-2 pads/day    Baseline has not leaked over the last couple of weaks; 1 pad during the day, 1 at night    Status Achieved      PT LONG TERM GOAL #2   Title Pt will report no nocturia    Baseline not waking up but is leaking some at night    Time 8    Period Weeks    Status On-going    Target Date 07/14/20      PT LONG TERM GOAL #3   Title Pt will report she can feel the urge to urinate at least 90% of the time due to improved health of pelvic floor muscles    Baseline 100%    Time 8    Period Weeks    Status Achieved    Target Date 07/14/20      PT LONG TERM GOAL #4   Title Pt will report she can control it 80% of the time when she feels the urge    Baseline I can hold it most of the time over the last 2 weeks; I leak urine when I sleep    Time 8    Period Weeks    Status Partially Met    Target Date 07/14/20      PT LONG TERM GOAL #5   Title Pt will be able to exercise consistently for at least 20 minutes 4-5 days/week due to improved function and energy levels    Baseline 15 minutes 2x/week and is coming  to PT 2/week    Time 8    Period Weeks    Status Partially Met    Target Date 07/14/20      PT LONG TERM GOAL #6   Title Pt will report pain during urination reduced to 5/10 at most.    Time 8    Period Weeks    Status On-going    Target Date 07/14/20                 Plan - 05/19/20 1128    Clinical Impression Statement Pt is making a lot of progress  in more recent weeks since doing dry needling as part of treatment plan.  This has helped reduce back pain and given her more access to activating the pelvic floor muscles.  Pt continues to have a lot of weakness in the hips and core and will benefit from several more visits to address strength and dry needle if needed so she can be successfully released with HEP.    Personal Factors and Comorbidities Comorbidity 3+    Comorbidities chemo, radiation, hx of breast cancer, chronic low back pain    Examination-Activity Limitations Toileting;Continence    Examination-Participation Restrictions Community Activity    Stability/Clinical Decision Making Evolving/Moderate complexity    Clinical Decision Making Moderate    Rehab Potential Excellent    PT Frequency 1x / week    PT Duration 8 weeks    PT Treatment/Interventions ADLs/Self Care Home Management;Biofeedback;Electrical Stimulation;Cryotherapy;Moist Heat;Functional mobility training;Therapeutic activities;Therapeutic exercise;Patient/family education;Neuromuscular re-education;Taping;Dry needling;Manual techniques    PT Next Visit Plan DN if needed; hip and core strength    PT Home Exercise Plan Access Code: [email or web address]    [id number] and Agree with Plan of Care Patient           Patient will benefit from skilled therapeutic intervention in order to improve the following deficits and impairments:  Increased muscle spasms, Impaired tone, Decreased range of motion, Decreased strength, Increased fascial restricitons, Impaired flexibility, Postural dysfunction, Pain  Visit Diagnosis: Muscle weakness (generalized)  Unspecified lack of coordination     Problem List Patient Active Problem List   Diagnosis Date Noted   Anal cancer ([place]) 06/13/2019   Hematuria, gross 03/11/2017   Status post total shoulder arthroplasty, right 09/12/2016   Arthritis of knee 02/27/2015   Primary osteoarthritis of right knee Valgus 02/24/2015   Neuropathy  09/07/2013   Breast cancer of upper-outer quadrant of left female breast ([place]) 08/09/2013   Breast cancer of upper-outer quadrant of right female breast ([place]) 08/09/2013   Osteoarthritis of right hip 05/03/2013   Osteoarthritis of left hip 02/15/2013    Camillo Flaming Keron Neenan,PT 05/19/2020, 11:47 AM   [facility] [address] [place], [place], [address] Phone: [phone]   Fax:  [phone]  Name: Cynthia Velasquez MRN: [id number] Date of Birth: 1951/04/08

## 2020-05-24 DIAGNOSIS — B880 Other acariasis: Secondary | ICD-10-CM | POA: Diagnosis not present

## 2020-05-24 DIAGNOSIS — L299 Pruritus, unspecified: Secondary | ICD-10-CM | POA: Diagnosis not present

## 2020-05-26 ENCOUNTER — Other Ambulatory Visit: Payer: Self-pay

## 2020-05-26 ENCOUNTER — Encounter: Payer: Self-pay | Admitting: Physical Therapy

## 2020-05-26 ENCOUNTER — Ambulatory Visit: Payer: PPO | Admitting: Physical Therapy

## 2020-05-26 DIAGNOSIS — R279 Unspecified lack of coordination: Secondary | ICD-10-CM

## 2020-05-26 DIAGNOSIS — M6281 Muscle weakness (generalized): Secondary | ICD-10-CM | POA: Diagnosis not present

## 2020-05-26 NOTE — Therapy (Signed)
[facility] [address] [place], [place], [address] Phone: [phone]   Fax:  [phone]  Physical Therapy Treatment  Patient Details  Name: Cynthia Velasquez MRN: [id number] Date of Birth: 18-Mar-1951 Referring Provider (PT): Truitt Merle, MD   Encounter Date: 05/26/2020   PT End of Session - 05/26/20 0931    Visit Number 37    Number of Visits 40    Date for PT Re-Evaluation 07/14/20    Authorization Type medicare - needs KX!!    PT Start Time [phone]    PT Stop Time 0930    PT Time Calculation (min) 40 min           Past Medical History:  Diagnosis Date  . Anxiety   . Arthritis   . Bilateral breast cancer ([place]) 10/21/2013  . Breast cancer ([place])   . Cancer ([place])   . Depression   . Gallstones   . GERD (gastroesophageal reflux disease)    doesn't take any meds for this  . Headache    h/o migraines      . History of bladder infections   . History of hiatal hernia   . History of migraine   . Hyperlipidemia    takes Simvastatin daily  . Hypertension    takes Hyzaar  . Joint pain   . Joint swelling   . Lumbar stenosis   . Neuropathy 09/07/2013  . Pneumonia   . Pre-diabetes     Past Surgical History:  Procedure Laterality Date  . ABDOMINAL HYSTERECTOMY    . bone spur removed from left foot    . CHOLECYSTECTOMY    . COLONOSCOPY    . double mastectomy     . HERNIA REPAIR     umbilical  . right knee arthroscopy    . right shoulder arthroscopy    . surgery for hiatal hernia    . TONSILLECTOMY    . TOTAL HIP ARTHROPLASTY Left 02/12/2013  . TOTAL HIP ARTHROPLASTY Left 02/12/2013   Procedure: LEFT TOTAL HIP ARTHROPLASTY;  Surgeon: Kerin Salen, MD;  Location: [place];  Service: Orthopedics;  Laterality: Left;  . TOTAL HIP ARTHROPLASTY Right 05/03/2013   Procedure: TOTAL HIP ARTHROPLASTY;  Surgeon: Kerin Salen, MD;  Location: [place];  Service: Orthopedics;  Laterality: Right;  . TOTAL KNEE ARTHROPLASTY  Right 02/27/2015   Procedure: TOTAL KNEE ARTHROPLASTY;  Surgeon: Frederik Pear, MD;  Location: [place];  Service: Orthopedics;  Laterality: Right;  . TOTAL SHOULDER ARTHROPLASTY Right 09/12/2016   Procedure: RIGHT TOTAL SHOULDER ARTHROPLASTY;  Surgeon: Tania Ade, MD;  Location: [place];  Service: Orthopedics;  Laterality: Right;  RIGHT TOTAL SHOULDER ARTHROPLASTY    There were no vitals filed for this visit.   Subjective Assessment - 05/27/20 1952    Subjective Still doing better and bowel control is better.  Burning with urinating is bad.    Currently in Pain? No/denies                             [facility] Adult PT Treatment/Exercise - 05/27/20 0001      Manual Therapy   Manual therapy comments pt identity confirmed and informed consent given to perform soft tissue internally    Internal Pelvic Floor bulbocavernosis and transvers peroneus fascial release; mobs to urethra and vaginal canal MFR  PT Short Term Goals - 11/16/19 1035      PT SHORT TERM GOAL #2   Title pt will report 30% less leakage    Status Achieved             PT Long Term Goals - 05/19/20 0927      PT LONG TERM GOAL #1   Title Pt will be able to reduce leakage and use only 1-2 pads/day    Baseline has not leaked over the last couple of weaks; 1 pad during the day, 1 at night    Status Achieved      PT LONG TERM GOAL #2   Title Pt will report no nocturia    Baseline not waking up but is leaking some at night    Time 8    Period Weeks    Status On-going    Target Date 07/14/20      PT LONG TERM GOAL #3   Title Pt will report she can feel the urge to urinate at least 90% of the time due to improved health of pelvic floor muscles    Baseline 100%    Time 8    Period Weeks    Status Achieved    Target Date 07/14/20      PT LONG TERM GOAL #4   Title Pt will report she can control it 80% of the time when she feels the urge    Baseline I can hold it most of  the time over the last 2 weeks; I leak urine when I sleep    Time 8    Period Weeks    Status Partially Met    Target Date 07/14/20      PT LONG TERM GOAL #5   Title Pt will be able to exercise consistently for at least 20 minutes 4-5 days/week due to improved function and energy levels    Baseline 15 minutes 2x/week and is coming to PT 2/week    Time 8    Period Weeks    Status Partially Met    Target Date 07/14/20      PT LONG TERM GOAL #6   Title Pt will report pain during urination reduced to 5/10 at most.    Time 8    Period Weeks    Status On-going    Target Date 07/14/20                 Plan - 05/26/20 1013    Clinical Impression Statement Pt did well with manual STM today.  Pt tolerates a little more depth of the STM today.  Vaginal canal is very narrowed and PT reviewed with patient on recommendations on using dilators.  Pt will benefit from skilled PT to continue working on soft tissue elongation so she can get more comfortable with being able to do this on her own.    PT Treatment/Interventions ADLs/Self Care Home Management;Biofeedback;Electrical Stimulation;Cryotherapy;Moist Heat;Functional mobility training;Therapeutic activities;Therapeutic exercise;Patient/family education;Neuromuscular re-education;Taping;Dry needling;Manual techniques    PT Next Visit Plan DN if needed; vaginal canal stretch internal, hip and core strength    PT Home Exercise Plan Access Code: [email or web address]    Consulted and Agree with Plan of Care Patient           Patient will benefit from skilled therapeutic intervention in order to improve the following deficits and impairments:  Increased muscle spasms, Impaired tone, Decreased range of motion, Decreased strength, Increased fascial restricitons, Impaired flexibility, Postural dysfunction, Pain  Visit  Diagnosis: Muscle weakness (generalized)  Unspecified lack of coordination     Problem List Patient Active Problem List   Diagnosis  Date Noted  . Anal cancer ([place]) 06/13/2019  . Hematuria, gross 03/11/2017  . Status post total shoulder arthroplasty, right 09/12/2016  . Arthritis of knee 02/27/2015  . Primary osteoarthritis of right knee Valgus 02/24/2015  . Neuropathy 09/07/2013  . Breast cancer of upper-outer quadrant of left female breast ([place]) 08/09/2013  . Breast cancer of upper-outer quadrant of right female breast ([place]) 08/09/2013  . Osteoarthritis of right hip 05/03/2013  . Osteoarthritis of left hip 02/15/2013    Jule Ser, PT 05/27/2020, 7:55 PM  [facility] [address], [place], [place], [address] Phone: [phone]   Fax:  [phone]  Name: Tynslee Bowlds MRN: [id number] Date of Birth: 1951/09/12

## 2020-06-01 DIAGNOSIS — F331 Major depressive disorder, recurrent, moderate: Secondary | ICD-10-CM | POA: Diagnosis not present

## 2020-06-01 DIAGNOSIS — I10 Essential (primary) hypertension: Secondary | ICD-10-CM | POA: Diagnosis not present

## 2020-06-01 DIAGNOSIS — E7800 Pure hypercholesterolemia, unspecified: Secondary | ICD-10-CM | POA: Diagnosis not present

## 2020-06-01 DIAGNOSIS — F329 Major depressive disorder, single episode, unspecified: Secondary | ICD-10-CM | POA: Diagnosis not present

## 2020-06-01 DIAGNOSIS — M150 Primary generalized (osteo)arthritis: Secondary | ICD-10-CM | POA: Diagnosis not present

## 2020-06-01 DIAGNOSIS — C20 Malignant neoplasm of rectum: Secondary | ICD-10-CM | POA: Diagnosis not present

## 2020-06-01 DIAGNOSIS — Z853 Personal history of malignant neoplasm of breast: Secondary | ICD-10-CM | POA: Diagnosis not present

## 2020-06-08 ENCOUNTER — Ambulatory Visit: Payer: PPO | Admitting: Physical Therapy

## 2020-06-08 ENCOUNTER — Other Ambulatory Visit: Payer: Self-pay

## 2020-06-08 ENCOUNTER — Encounter: Payer: Self-pay | Admitting: Physical Therapy

## 2020-06-08 DIAGNOSIS — R279 Unspecified lack of coordination: Secondary | ICD-10-CM

## 2020-06-08 DIAGNOSIS — M6281 Muscle weakness (generalized): Secondary | ICD-10-CM | POA: Diagnosis not present

## 2020-06-08 DIAGNOSIS — H9192 Unspecified hearing loss, left ear: Secondary | ICD-10-CM | POA: Diagnosis not present

## 2020-06-08 DIAGNOSIS — H6122 Impacted cerumen, left ear: Secondary | ICD-10-CM | POA: Diagnosis not present

## 2020-06-08 NOTE — Therapy (Signed)
[facility] [address], [place], [place], [address] Phone: [phone]   Fax:  [phone]  Physical Therapy Treatment  Patient Details  Name: Cynthia Velasquez MRN: [id number] Date of Birth: 01/14/1951 Referring Provider (PT): Truitt Merle, MD   Encounter Date: 06/08/2020   PT End of Session - 06/08/20 1237    Visit Number 38    Number of Visits 40    Date for PT Re-Evaluation 07/14/20    Authorization Type medicare - needs KX!!    PT Start Time 1237    PT Stop Time 1315    PT Time Calculation (min) 38 min    Activity Tolerance Patient tolerated treatment well    Behavior During Therapy WFL for tasks assessed/performed           Past Medical History:  Diagnosis Date  . Anxiety   . Arthritis   . Bilateral breast cancer ([place]) 10/21/2013  . Breast cancer ([place])   . Cancer ([place])   . Depression   . Gallstones   . GERD (gastroesophageal reflux disease)    doesn't take any meds for this  . Headache    h/o migraines      . History of bladder infections   . History of hiatal hernia   . History of migraine   . Hyperlipidemia    takes Simvastatin daily  . Hypertension    takes Hyzaar  . Joint pain   . Joint swelling   . Lumbar stenosis   . Neuropathy 09/07/2013  . Pneumonia   . Pre-diabetes     Past Surgical History:  Procedure Laterality Date  . ABDOMINAL HYSTERECTOMY    . bone spur removed from left foot    . CHOLECYSTECTOMY    . COLONOSCOPY    . double mastectomy     . HERNIA REPAIR     umbilical  . right knee arthroscopy    . right shoulder arthroscopy    . surgery for hiatal hernia    . TONSILLECTOMY    . TOTAL HIP ARTHROPLASTY Left 02/12/2013  . TOTAL HIP ARTHROPLASTY Left 02/12/2013   Procedure: LEFT TOTAL HIP ARTHROPLASTY;  Surgeon: Kerin Salen, MD;  Location: [place];  Service: Orthopedics;  Laterality: Left;  . TOTAL HIP ARTHROPLASTY Right 05/03/2013   Procedure: TOTAL HIP ARTHROPLASTY;   Surgeon: Kerin Salen, MD;  Location: [place];  Service: Orthopedics;  Laterality: Right;  . TOTAL KNEE ARTHROPLASTY Right 02/27/2015   Procedure: TOTAL KNEE ARTHROPLASTY;  Surgeon: Frederik Pear, MD;  Location: [place];  Service: Orthopedics;  Laterality: Right;  . TOTAL SHOULDER ARTHROPLASTY Right 09/12/2016   Procedure: RIGHT TOTAL SHOULDER ARTHROPLASTY;  Surgeon: Tania Ade, MD;  Location: [place];  Service: Orthopedics;  Laterality: Right;  RIGHT TOTAL SHOULDER ARTHROPLASTY    There were no vitals filed for this visit.   Subjective Assessment - 06/08/20 1423    Subjective Having back and hip pain again and leakage for the last 5 days.  No more bladder burning with urination.    Currently in Pain? Yes    Pain Score --   severe   Pain Location Back    Pain Orientation Lower;Left    Pain Descriptors / Indicators Sore;Aching    Pain Type Chronic pain    Pain Onset More than a month ago    Multiple Pain Sites No  [place] Adult PT Treatment/Exercise - 06/08/20 0001      Manual Therapy   Myofascial Release Lt gluteals and lumbar paraspinals in sidelying            Trigger Point Dry Needling - 06/08/20 0001    Dry Needling Comments Left only    Gluteus Minimus Response Twitch response elicited;Palpable increased muscle length    Gluteus Medius Response Twitch response elicited;Palpable increased muscle length    Piriformis Response Twitch response elicited;Palpable increased muscle length    Lumbar multifidi Response Twitch response elicited;Palpable increased muscle length   L1-5                 PT Short Term Goals - 11/16/19 1035      PT SHORT TERM GOAL #2   Title pt will report 30% less leakage    Status Achieved             PT Long Term Goals - 05/19/20 0927      PT LONG TERM GOAL #1   Title Pt will be able to reduce leakage and use only 1-2 pads/day    Baseline has not leaked over the last couple of weaks; 1 pad  during the day, 1 at night    Status Achieved      PT LONG TERM GOAL #2   Title Pt will report no nocturia    Baseline not waking up but is leaking some at night    Time 8    Period Weeks    Status On-going    Target Date 07/14/20      PT LONG TERM GOAL #3   Title Pt will report she can feel the urge to urinate at least 90% of the time due to improved health of pelvic floor muscles    Baseline 100%    Time 8    Period Weeks    Status Achieved    Target Date 07/14/20      PT LONG TERM GOAL #4   Title Pt will report she can control it 80% of the time when she feels the urge    Baseline I can hold it most of the time over the last 2 weeks; I leak urine when I sleep    Time 8    Period Weeks    Status Partially Met    Target Date 07/14/20      PT LONG TERM GOAL #5   Title Pt will be able to exercise consistently for at least 20 minutes 4-5 days/week due to improved function and energy levels    Baseline 15 minutes 2x/week and is coming to PT 2/week    Time 8    Period Weeks    Status Partially Met    Target Date 07/14/20      PT LONG TERM GOAL #6   Title Pt will report pain during urination reduced to 5/10 at most.    Time 8    Period Weeks    Status On-going    Target Date 07/14/20                 Plan - 06/08/20 1416    Clinical Impression Statement Today's session focused on reducing muscle spasms in gluteal and lumbar paraspinals due to increased pain.  Pt did have good response to previous treatment and has not had burning with urination since that time.  Pt had reduction of low back and hip pain symptoms after STM and DN treatment today.  In general, patient had been doing much better with continence until her back pain flared up again.  Due to patient having back pain that accompanies fecal incontinence she was recommended to return to physician ASAP due to symptoms possibly associated with cauda equina.  Pt will benefit from skilled PT to progress core strength  and soft tissue release in pelvic floor as needed.    PT Treatment/Interventions ADLs/Self Care Home Management;Biofeedback;Electrical Stimulation;Cryotherapy;Moist Heat;Functional mobility training;Therapeutic activities;Therapeutic exercise;Patient/family education;Neuromuscular re-education;Taping;Dry needling;Manual techniques           Patient will benefit from skilled therapeutic intervention in order to improve the following deficits and impairments:  Increased muscle spasms, Impaired tone, Decreased range of motion, Decreased strength, Increased fascial restricitons, Impaired flexibility, Postural dysfunction, Pain  Visit Diagnosis: Muscle weakness (generalized)  Unspecified lack of coordination     Problem List Patient Active Problem List   Diagnosis Date Noted  . Anal cancer ([place]) 06/13/2019  . Hematuria, gross 03/11/2017  . Status post total shoulder arthroplasty, right 09/12/2016  . Arthritis of knee 02/27/2015  . Primary osteoarthritis of right knee Valgus 02/24/2015  . Neuropathy 09/07/2013  . Breast cancer of upper-outer quadrant of left female breast ([place]) 08/09/2013  . Breast cancer of upper-outer quadrant of right female breast ([place]) 08/09/2013  . Osteoarthritis of right hip 05/03/2013  . Osteoarthritis of left hip 02/15/2013    Jule Ser, PT 06/08/2020, 2:26 PM  [facility] [address] [place], [place], [address] Phone: [phone]   Fax:  [phone]  Name: Cynthia Velasquez MRN: [id number] Date of Birth: 1951/08/09

## 2020-06-13 IMAGING — US IR PICC >5YO
1 series · 3 of 3 positions shown · non-contrast
Comparison: none

INDICATION: access requested for chemotherapy

EXAM:
ULTRASOUND AND FLUOROSCOPIC GUIDED RIGHT UPPER EXTREMITY PICC LINE
INSERTION
MEDICATIONS:
1% lidocaine to skin and subcutaneous tissue
ANESTHESIA/SEDATION:
None
FLUOROSCOPY TIME:  Fluoroscopy Time:  42 seconds (8 mGy).
COMPLICATIONS:
None immediate.
TECHNIQUE: The procedure, risks, benefits, and alternatives were explained to
the patient and informed written consent was obtained. A timeout was
performed prior to the initiation of the procedure.

[Series 1: ir fluoro/shunt/fist · 3 of 3 slices shown]
[im 1/3]
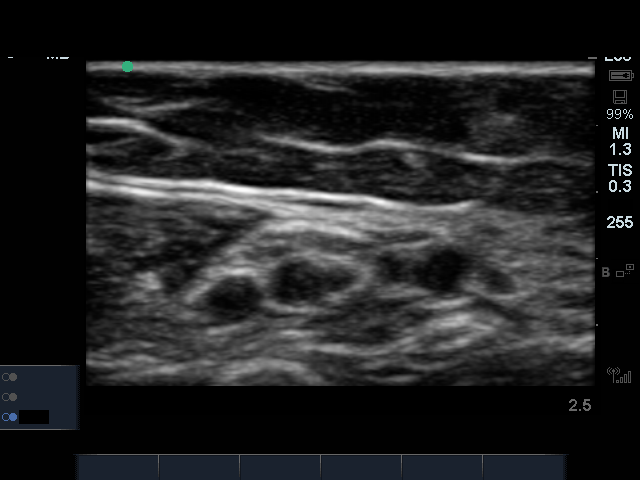
[im 2/3]
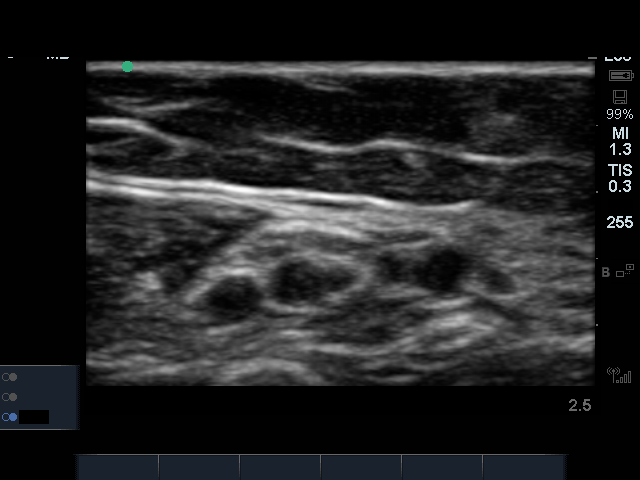
[im 3/3]
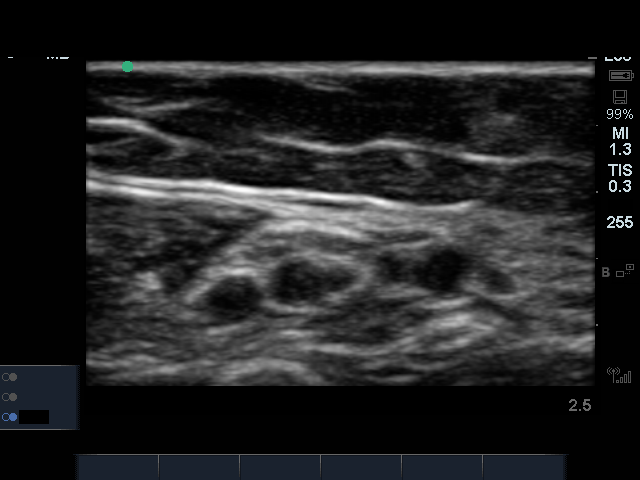

[3 of 3 positions shown; findings below may reference images not displayed]

The right upper extremity was prepped with chlorhexidine in a
sterile fashion, and a sterile drape was applied covering the
operative field. Maximum barrier sterile technique with sterile
gowns and gloves were used for the procedure. A timeout was
performed prior to the initiation of the procedure. Local anesthesia
was provided with 1% lidocaine.

Under direct ultrasound guidance, the right brachial vein was
accessed with a micropuncture kit after the overlying soft tissues
were anesthetized with 1% lidocaine. An ultrasound image was saved
for documentation purposes. A guidewire was advanced to the level of
the superior caval-atrial junction for measurement purposes and the
PICC line was cut to length. A peel-away sheath was placed and a 38
cm, 5 French, single lumen was inserted to level of the superior
caval-atrial junction. A post procedure spot fluoroscopic was
obtained. The catheter easily aspirated and flushed and was sutured
in place. A dressing was placed. The patient tolerated the procedure
well without immediate post procedural complication.
FINDINGS: After catheter placement, the tip lies within the superior
cavoatrial junction the catheter aspirates and flushes normally and
is ready for immediate use.
IMPRESSION: Successful ultrasound and fluoroscopic guided placement of a right
brachial vein approach, 38 cm, 5 French, single lumen PICC with tip
at the superior caval-atrial junction. The PICC line is ready for
immediate use.

## 2020-07-06 ENCOUNTER — Other Ambulatory Visit: Payer: Self-pay

## 2020-07-06 ENCOUNTER — Ambulatory Visit: Payer: PPO | Attending: Hematology | Admitting: Physical Therapy

## 2020-07-06 ENCOUNTER — Encounter: Payer: Self-pay | Admitting: Physical Therapy

## 2020-07-06 DIAGNOSIS — R279 Unspecified lack of coordination: Secondary | ICD-10-CM | POA: Diagnosis not present

## 2020-07-06 DIAGNOSIS — M6281 Muscle weakness (generalized): Secondary | ICD-10-CM | POA: Diagnosis not present

## 2020-07-06 DIAGNOSIS — Z20822 Contact with and (suspected) exposure to covid-19: Secondary | ICD-10-CM | POA: Diagnosis not present

## 2020-07-06 NOTE — Therapy (Addendum)
[facility] [address] [place], [place], [address] Phone: [phone]   Fax:  [phone]  Physical Therapy Treatment  Patient Details  Name: Cynthia Velasquez MRN: [id number] Date of Birth: 1951-09-07 Referring Provider (PT): Truitt Merle, MD   Encounter Date: 07/06/2020   PT End of Session - 07/06/20 0938    Visit Number 39    Number of Visits 40    Date for PT Re-Evaluation 07/14/20    Authorization Type medicare - needs KX!!    PT Start Time [phone]    PT Stop Time 1011    PT Time Calculation (min) 40 min    Activity Tolerance Patient tolerated treatment well    Behavior During Therapy WFL for tasks assessed/performed           Past Medical History:  Diagnosis Date  . Anxiety   . Arthritis   . Bilateral breast cancer ([place]) 10/21/2013  . Breast cancer ([place])   . Cancer ([place])   . Depression   . Gallstones   . GERD (gastroesophageal reflux disease)    doesn't take any meds for this  . Headache    h/o migraines      . History of bladder infections   . History of hiatal hernia   . History of migraine   . Hyperlipidemia    takes Simvastatin daily  . Hypertension    takes Hyzaar  . Joint pain   . Joint swelling   . Lumbar stenosis   . Neuropathy 09/07/2013  . Pneumonia   . Pre-diabetes     Past Surgical History:  Procedure Laterality Date  . ABDOMINAL HYSTERECTOMY    . bone spur removed from left foot    . CHOLECYSTECTOMY    . COLONOSCOPY    . double mastectomy     . HERNIA REPAIR     umbilical  . right knee arthroscopy    . right shoulder arthroscopy    . surgery for hiatal hernia    . TONSILLECTOMY    . TOTAL HIP ARTHROPLASTY Left 02/12/2013  . TOTAL HIP ARTHROPLASTY Left 02/12/2013   Procedure: LEFT TOTAL HIP ARTHROPLASTY;  Surgeon: Kerin Salen, MD;  Location: [place];  Service: Orthopedics;  Laterality: Left;  . TOTAL HIP ARTHROPLASTY Right 05/03/2013   Procedure: TOTAL HIP ARTHROPLASTY;   Surgeon: Kerin Salen, MD;  Location: [place];  Service: Orthopedics;  Laterality: Right;  . TOTAL KNEE ARTHROPLASTY Right 02/27/2015   Procedure: TOTAL KNEE ARTHROPLASTY;  Surgeon: Frederik Pear, MD;  Location: [place];  Service: Orthopedics;  Laterality: Right;  . TOTAL SHOULDER ARTHROPLASTY Right 09/12/2016   Procedure: RIGHT TOTAL SHOULDER ARTHROPLASTY;  Surgeon: Tania Ade, MD;  Location: [place];  Service: Orthopedics;  Laterality: Right;  RIGHT TOTAL SHOULDER ARTHROPLASTY    There were no vitals filed for this visit.   Subjective Assessment - 07/06/20 0935    Subjective I started to have oily and dark bowel issues again.  I am having back pain today that has still been bad .  I feel like the balance is getting worse    Patient Stated Goals leakage, energy levels, being able to sleep at night    Currently in Pain? Yes    Pain Score 6     Pain Location Back    Pain Orientation Left;Lower;Right    Pain Descriptors / Indicators Aching;Sore;Cramping    Pain Type Chronic pain    Pain Radiating Towards Lt hip sore/pain; cramps  in Rt hip    Pain Onset More than a month ago    Pain Frequency Intermittent    Multiple Pain Sites No                             [facility] Adult PT Treatment/Exercise - 07/06/20 0001      Manual Therapy   Manual therapy comments pt identity confirmed and informed consent given to perform soft tissue internally    Myofascial Release Lt gluteals and lumbar paraspinals in sidelying    Internal Pelvic Floor anal sphincter and puborectaliz contract/relax MET          single leg stand 1 sec Rt; 3 sec Lt LE nustep x 8 min PT present for status - began hurting low back   Trigger Point Dry Needling - 07/06/20 0001    Dry Needling Comments Left only    Gluteus Minimus Response Twitch response elicited;Palpable increased muscle length    Gluteus Medius Response Twitch response elicited;Palpable increased muscle length    Piriformis Response Twitch  response elicited;Palpable increased muscle length    Lumbar multifidi Response Twitch response elicited;Palpable increased muscle length                  PT Short Term Goals - 11/16/19 1035      PT SHORT TERM GOAL #2   Title pt will report 30% less leakage    Status Achieved             PT Long Term Goals - 07/06/20 0954      PT LONG TERM GOAL #1   Title Pt will be able to reduce leakage and use only 1-2 pads/day    Status Achieved      PT LONG TERM GOAL #4   Title Pt will report she can control it 80% of the time when she feels the urge    Status Partially Met      PT LONG TERM GOAL #6   Title Pt will report pain during urination reduced to 5/10 at most.    Baseline 7/10 worst; usually 3-4/10    Time 8    Status Partially Met                 Plan - 07/06/20 1011    Clinical Impression Statement Pt responded well to STM with stretching using contract/relax to anal sphincters and puborectalis.  Pt had discomfort with stretching and limited elasticity of those muscles.  Pt still having low back/hip pain that still seems related to increased fecal incontinence.  PT released trigger points in gluteals using STM techniques and dry needling  Pt will benefit from skilled PT to continue addressing impairments and strength.  Pt demonstrates balance deficits only able to do single leg stand for 1-3 seconds on each side.    PT Treatment/Interventions ADLs/Self Care Home Management;Biofeedback;Electrical Stimulation;Cryotherapy;Moist Heat;Functional mobility training;Therapeutic activities;Therapeutic exercise;Patient/family education;Neuromuscular re-education;Taping;Dry needling;Manual techniques    PT Next Visit Plan re-eval; make sure knows how to use dilators anal and vaginal    PT Home Exercise Plan Access Code: [email or web address]    Consulted and Agree with Plan of Care Patient           Patient will benefit from skilled therapeutic intervention in order to improve the  following deficits and impairments:  Increased muscle spasms, Impaired tone, Decreased range of motion, Decreased strength, Increased fascial restricitons, Impaired flexibility, Postural dysfunction, Pain  Visit Diagnosis:  Muscle weakness (generalized)  Unspecified lack of coordination     Problem List Patient Active Problem List   Diagnosis Date Noted  . Anal cancer ([place]) 06/13/2019  . Hematuria, gross 03/11/2017  . Status post total shoulder arthroplasty, right 09/12/2016  . Arthritis of knee 02/27/2015  . Primary osteoarthritis of right knee Valgus 02/24/2015  . Neuropathy 09/07/2013  . Breast cancer of upper-outer quadrant of left female breast ([place]) 08/09/2013  . Breast cancer of upper-outer quadrant of right female breast ([place]) 08/09/2013  . Osteoarthritis of right hip 05/03/2013  . Osteoarthritis of left hip 02/15/2013    Jule Ser, PT 07/06/2020, 10:24 AM  [facility] [address] [place], [place], [address] Phone: [phone]   Fax:  [phone]  Name: Cynthia Velasquez MRN: [id number] Date of Birth: 02/17/1951  PHYSICAL THERAPY DISCHARGE SUMMARY  Visits from Start of Care: 39  Current functional level related to goals / functional outcomes: See above goals   Remaining deficits: See above   Education / Equipment: HEP  Plan: Patient agrees to discharge.  Patient goals were partially met. Patient is being discharged due to not returning since the last visit.  ?????    Pt was going to come to add low back to POC but cancelled and did not reschedule at this time  Gustavus Bryant, PT 09/14/20 12:13 PM'

## 2020-07-07 DIAGNOSIS — M47816 Spondylosis without myelopathy or radiculopathy, lumbar region: Secondary | ICD-10-CM | POA: Diagnosis not present

## 2020-07-11 ENCOUNTER — Ambulatory Visit: Payer: PPO | Admitting: Physical Therapy

## 2020-07-13 ENCOUNTER — Encounter: Payer: PPO | Admitting: Physical Therapy

## 2020-07-18 ENCOUNTER — Ambulatory Visit: Payer: PPO | Admitting: Physical Therapy

## 2020-07-21 ENCOUNTER — Other Ambulatory Visit: Payer: Self-pay

## 2020-07-21 DIAGNOSIS — C50412 Malignant neoplasm of upper-outer quadrant of left female breast: Secondary | ICD-10-CM

## 2020-07-21 DIAGNOSIS — C210 Malignant neoplasm of anus, unspecified: Secondary | ICD-10-CM

## 2020-07-21 DIAGNOSIS — Z170 Estrogen receptor positive status [ER+]: Secondary | ICD-10-CM

## 2020-07-21 NOTE — Progress Notes (Signed)
[name]   [phone] [phone]   Clinic Follow up Note   Patient Care Team: Kathyrn Lass, MD as PCP - General (Family Medicine) Wonda Horner, MD as Consulting Physician (Gastroenterology) Truitt Merle, MD as Consulting Physician (Hematology) Alla Feeling, NP as Nurse Practitioner (Nurse Practitioner) Kyung Rudd, MD as Consulting Physician (Radiation Oncology) Michael [place], MD as Consulting Physician (General Surgery)  Date of Service:  07/24/2020  CHIEF COMPLAINT: f/u of rectal cancer  SUMMARY OF ONCOLOGIC HISTORY: Oncology History Overview Note  Breast cancer of upper-outer quadrant of right female breast, (ER/PR positive, her2/Neu negative)   Primary site: Breast (Right)   Staging method: AJCC 7th Edition   Clinical: Stage IA (T1a, N0, cM0) signed by Cynthia Lark, MD on 09/07/2013 11:14 AM   Pathologic: Stage IA (T1a, N0, cM0) signed by Cynthia Lark, MD on 09/07/2013 11:14 AM   Summary: Stage IA (T1a, N0, cM0)  Breast cancer of upper-outer quadrant of left female breast (ER/PR/Her2Neu negative)   Primary site: Breast (Left)   Staging method: AJCC 7th Edition   Clinical: Stage IIA (T2, N0, cM0) signed by Cynthia Lark, MD on 09/07/2013 11:35 AM   Pathologic: Stage IIA (T2, N0, cM0) signed by Cynthia Lark, MD on 09/07/2013 11:35 AM   Summary: Stage IIA (T2, N0, cM0)     Breast cancer of upper-outer quadrant of left female breast (Cynthia Velasquez)  07/10/2007 Procedure   US biopsy showed invasive ductal carcinoma 0.8cm, Nottingham grade 3   07/30/2007 Surgery   She had left breast lumpectomy and LN biopsy which showed high grade invasive ductal cancer Nottingham grade 3, 2.7 cm, ER/PR/Her2 neu negative   11/24/2007 Surgery   Patient elected for bilateral mastectomy   09/07/2008 - 03/08/2009 Chemotherapy   dates are approximate: she received adriamycin and cytoxan followed by Taxol   03/08/2009 - 05/08/2009 Radiation Therapy   dates are approximate,  she received XRT    Breast cancer of upper-outer quadrant of right female breast ([place])  07/10/2006 Procedure   stereotactic biopsy showed atypical hyperplasia   10/23/2006 Surgery   right lumpectomy showed invasive ductal ca (Nottingham grade 1)  59m and DCIS, ER/PR positive Her 2 negative   10/09/2007 - 10/08/2012 Chemotherapy   Patient was placed on Tamoxifen   03/24/2017 Imaging   No acute findings. No evidence of recurrent carcinoma or metastatic disease   Anal cancer ([place]  06/04/2019 Procedure   Colonoscopy per Dr. SAnson Fret Findings-the digital rectal exam revealed a 3 cm diameter firm rectal mass.  The mass was noncircumferential and located predominantly at the right bowel wall at the anorectal junction. A nonobstructing mass was found at the anus and in the rectum    06/04/2019 Initial Biopsy   Follow pathology: Large intestine, rectum biopsy: Invasive well to moderately differentiated squamous cell carcinoma.  No rectal mucosa present.  There is strong diffuse expression of P 16 immunostain.  CDX 2, p63 and mCEA immunostains are also used in the diagnostic work-up of the case.   06/04/2019 Initial Diagnosis   Anal cancer ([place]   06/21/2019 PET scan   IMPRESSION: 1. Anorectal primary. No hypermetabolic metastatic disease within the chest, abdomen, or pelvis. Perirectal nodes, at least 1 of which is new since 02/26/2018 CT, suspicious based on size and interval development. 2. Mild limitations secondary to beam hardening artifact from bilateral hip arthroplasty. 3.  Aortic Atherosclerosis (ICD10-I70.0).   06/28/2019 Cancer Staging   Staging form: Anus, AJCC 8th Edition -  Clinical stage from 06/28/2019: Stage IIA (cT2, cN0, cM0) - Signed by Alla Feeling, NP on 06/28/2019   06/28/2019 - 07/26/2019 Chemotherapy   concurrent chemoRT with Mitomycin and 5FU on week 1 and week 5 on starting 06/28/19. Last dose on 07/26/19   06/28/2019 - 08/09/2019 Radiation Therapy    concurrent chemoRT with Dr [name] 06/28/19-08/09/19   11/01/2019 PET scan   IMPRESSION: 1. Marked interval decrease in hypermetabolism noted at the level of the anal rectal primary. No evidence for hypermetabolic metastatic disease in the chest, abdomen, or pelvis. The perirectal lymph nodes identified previously have resolved in the interval. 2.  Aortic Atherosclerois (ICD10-170.0)      CURRENT THERAPY:  Surveillance  INTERVAL HISTORY:  Cynthia Velasquez is here for a follow up of rectal cancer. She presents to the clinic alone. She notes she still has urinary issues with dysuria. She notes she has sacral back pain which hurts to sit and stand for long periods of time. This back pain has been ongoing for 2 months. She also notes fatigue. She notes she has trouble driving long distances. She notes last month she had rectal bleeding that only lasted 1 day. She plans to see ortho Dr Mina Marble. She notes having bloating, which she attributes to her hernia. She plan to f/u with Dr [name] in 3 months. She notes she may have to return to work soon, but given her pain and her fatigue she may need hour adjustments or help.     REVIEW OF SYSTEMS:   Constitutional: Denies fevers, chills or abnormal weight loss (+) Fatigue  Eyes: Denies blurriness of vision Ears, nose, mouth, throat, and face: Denies mucositis or sore throat Respiratory: Denies cough, dyspnea or wheezes UA: (+) Dysuria Cardiovascular: Denies palpitation, chest discomfort or lower extremity swelling Gastrointestinal:  Denies nausea, heartburn or change in bowel habits MSK: (+) Sacral back pain radiating  Skin: Denies abnormal skin rashes Lymphatics: Denies new lymphadenopathy or easy bruising Neurological:Denies numbness, tingling or new weaknesses Behavioral/Psych: Mood is stable, no new changes  All other systems were reviewed with the patient and are negative.  MEDICAL HISTORY:  Past Medical History:  Diagnosis Date  . Anxiety     . Arthritis   . Bilateral breast cancer ([place]) 10/21/2013  . Breast cancer ([place])   . Cancer ([place])   . Depression   . Gallstones   . GERD (gastroesophageal reflux disease)    doesn't take any meds for this  . Headache    h/o migraines      . History of bladder infections   . History of hiatal hernia   . History of migraine   . Hyperlipidemia    takes Simvastatin daily  . Hypertension    takes Hyzaar  . Joint pain   . Joint swelling   . Lumbar stenosis   . Neuropathy 09/07/2013  . Pneumonia   . Pre-diabetes     SURGICAL HISTORY: Past Surgical History:  Procedure Laterality Date  . ABDOMINAL HYSTERECTOMY    . bone spur removed from left foot    . CHOLECYSTECTOMY    . COLONOSCOPY    . double mastectomy     . HERNIA REPAIR     umbilical  . right knee arthroscopy    . right shoulder arthroscopy    . surgery for hiatal hernia    . TONSILLECTOMY    . TOTAL HIP ARTHROPLASTY Left 02/12/2013  . TOTAL HIP ARTHROPLASTY Left 02/12/2013   Procedure: LEFT TOTAL HIP  ARTHROPLASTY;  Surgeon: Kerin Salen, MD;  Location: [place];  Service: Orthopedics;  Laterality: Left;  . TOTAL HIP ARTHROPLASTY Right 05/03/2013   Procedure: TOTAL HIP ARTHROPLASTY;  Surgeon: Kerin Salen, MD;  Location: [facility];  Service: Orthopedics;  Laterality: Right;  . TOTAL KNEE ARTHROPLASTY Right 02/27/2015   Procedure: TOTAL KNEE ARTHROPLASTY;  Surgeon: Frederik Pear, MD;  Location: [facility];  Service: Orthopedics;  Laterality: Right;  . TOTAL SHOULDER ARTHROPLASTY Right 09/12/2016   Procedure: RIGHT TOTAL SHOULDER ARTHROPLASTY;  Surgeon: Tania Ade, MD;  Location: [facility];  Service: Orthopedics;  Laterality: Right;  RIGHT TOTAL SHOULDER ARTHROPLASTY    I have reviewed the social history and family history with the patient and they are unchanged from previous note.  ALLERGIES:  is allergic to oxycodone-acetaminophen, codeine, and vicodin [hydrocodone-acetaminophen].  MEDICATIONS:  Current Outpatient Medications   Medication Sig Dispense Refill  . LINZESS 72 MCG capsule     . losartan (COZAAR) 100 MG tablet TK 1 T PO D  6  . ondansetron (ZOFRAN ODT) 4 MG disintegrating tablet Take 1 tablet (4 mg total) by mouth every 8 (eight) hours as needed for nausea or vomiting. 6 tablet 0  . PROAIR HFA 108 (90 Base) MCG/ACT inhaler 2 inhalations every 4-6 hours as needed. (Patient taking differently: Inhale 2 puffs into the lungs every 6 (six) hours as needed for wheezing or shortness of breath. ) 18 g 1  . rosuvastatin (CRESTOR) 40 MG tablet Take 40 mg by mouth daily.     No current facility-administered medications for this visit.    PHYSICAL EXAMINATION: ECOG PERFORMANCE STATUS: 0 - Asymptomatic  Vitals:   07/24/20 0904  BP: (!) 162/94  Pulse: 81  Resp: 18  Temp: (!) 97.4 F (36.3 C)  SpO2: 99%   Filed Weights   07/24/20 0904  Weight: 222 lb 14.4 oz (101.1 kg)    GENERAL:alert, no distress and comfortable SKIN: skin color, texture, turgor are normal, no rashes or significant lesions EYES: normal, Conjunctiva are pink and non-injected, sclera clear  NECK: supple, thyroid normal size, non-tender, without nodularity LYMPH:  no palpable lymphadenopathy in the cervical, axillary or inguinal LUNGS: clear to auscultation and percussion with normal breathing effort HEART: regular rate & rhythm and no murmurs and no lower extremity edema ABDOMEN:abdomen soft, non-tender and normal bowel sounds Musculoskeletal:no cyanosis of digits and no clubbing  NEURO: alert & oriented x 3 with fluent speech, no focal motor/sensory deficits RECTAL: (+) External mild hemorrhoids. No palpable mass. Normal stool present. Benign exam with tenderness BREAST: (+) S/p b/l mastectomy  LABORATORY DATA:  I have reviewed the data as listed CBC Latest Ref Rng & Units 07/24/2020 03/22/2020 12/23/2019  WBC 4.0 - 10.5 K/uL 3.7(L) 3.2(L) 3.4(L)  Hemoglobin 12.0 - 15.0 g/dL 12.1 12.2 12.1  Hematocrit 36 - 46 % 38.2 38.9 39.1   Platelets 150 - 400 K/uL 175 164 161     CMP Latest Ref Rng & Units 07/24/2020 03/22/2020 12/23/2019  Glucose 70 - 99 mg/dL 97 93 96  BUN 8 - 23 mg/dL _0 Creatinine 0.44 - 1.00 mg/dL 0.90 0.96 0.89  Sodium 135 - 145 mmol/L 139 142 141  Potassium 3.5 - 5.1 mmol/L 3.7 3.9 4.2  Chloride 98 - 111 mmol/L 106 109 109  CO2 22 - 32 mmol/L _1 Calcium 8.9 - 10.3 mg/dL 8.9 9.5 9.1  Total Protein 6.5 - 8.1 g/dL 6.6 7.0 7.4  Total Bilirubin 0.3 -  1.2 mg/dL 0.5 0.4 0.4  Alkaline Phos 38 - 126 U/L 78 87 82  AST 15 - 41 U/L _0 ALT 0 - 44 U/L _1 RADIOGRAPHIC STUDIES: I have personally reviewed the radiological images as listed and agreed with the findings in the report. No results found.   ASSESSMENT & PLAN:  Jorja Empie is a 69 y.o. female with    1. Anal Squamous Cell Carcinoma, cT2N0M0 -She was diagnosed in 05/2019.Workup showed a 3 cm mass at the anorectal junction, biopsy confirmed well to moderately differentiated squamous cell carcinoma.10/5/20PET scan showed no metastasis. -Shecompletedstandard treatment withconcurrent chemoRT with Mitomycin and 5FU on 08/09/19. -PET scan on 11/01/19 showedmarked interval decrease in hypermetabolism of the primary anal mass and resolution of the perirectal lymph nodes. No evidence of metastatic disease in the chest, abdomen or pelvis. Overall great response to treatment, near resolved.  -She is clinically stable. Labs reviewed, CBC and CMP WNL except WBC 3.7. Physical exam benign.  -Continue Surveillance. Given her sacral pain and continued fatigue, I will scan her sooner in 1-3 weeks -F/u with Dr [name] in 3 months. F/u with me in 6 months.   2. Dysuria, Fatigue, secondary to prior chemoRT -She has urinary changes which include Dysuria. Stable.  -Her fatigue is stable since RT without change.  -She has change in bowel habits. She takes Linzess and Metamucil to balance this.  -She is eating well and able to  maintain her weight lately.  3. H/o bilateral breast cancer, Genetic negative  2007: right breast biopsy showed atypical hyperplasia, s/p right lumpectomy in 06/2006 ER/PR+/HER2-, grade 1. Completed tamoxifen from 09/2007 - 09/2012 2008: left breast invasive ductal carcinoma, triple negative, grade 3 s/p lumpectomy in 07/2007 and eventual bilateral mastectomy; s/p adjuvant chemo AC-T and radiation  -Per pt her Genetics Testing was negative. She was adopted, no known family history   4. HTN -on losartan. Continue to f/u with PCP  5. Back pain  -She notes for the past 2 months having sacral back pain that radiates and is exacerbated by sitting or standing for too long.  -She plans to f/u with Ortho Dr Mina Marble  -I will evaluate on next scan in 1-3 weeks.   PLAN: -Flu shot today  -CT AP w contrast in 1-3 weeks for anal cancer surveillance -Lab and f/u in 6 months, she will see Dr. [name] in 3 months   No problem-specific Assessment & Plan notes found for this encounter.   Orders Placed This Encounter  Procedures  . CT CHEST ABDOMEN PELVIS W CONTRAST    Standing Status:   Future    Standing Expiration Date:   07/24/2021    Order Specific Question:   If indicated for the ordered procedure, I authorize the administration of contrast media per Radiology protocol    Answer:   Yes    Order Specific Question:   Preferred imaging location?    Answer:   [facility]    Order Specific Question:   Release to patient    Answer:   Immediate    Order Specific Question:   Is Oral Contrast requested for this exam?    Answer:   Yes, Per Radiology protocol    Order Specific Question:   Reason for Exam (SYMPTOM  OR DIAGNOSIS REQUIRED)    Answer:   fatigue and sacral pain   All questions were answered. The patient knows to call the clinic with any  problems, questions or concerns. No barriers to learning was detected. The total time spent in the appointment was 30 minutes.     Truitt Merle,  MD 07/24/2020   I, Joslyn Devon, am acting as scribe for Truitt Merle, MD.   I have reviewed the above documentation for accuracy and completeness, and I agree with the above.

## 2020-07-24 ENCOUNTER — Encounter: Payer: Self-pay | Admitting: Hematology

## 2020-07-24 ENCOUNTER — Inpatient Hospital Stay: Payer: PPO

## 2020-07-24 ENCOUNTER — Inpatient Hospital Stay: Payer: PPO | Attending: Hematology | Admitting: Hematology

## 2020-07-24 ENCOUNTER — Other Ambulatory Visit: Payer: Self-pay

## 2020-07-24 VITALS — BP 162/94 | HR 81 | Temp 97.4°F | Resp 18 | Ht 65.0 in | Wt 222.9 lb

## 2020-07-24 DIAGNOSIS — M549 Dorsalgia, unspecified: Secondary | ICD-10-CM | POA: Diagnosis not present

## 2020-07-24 DIAGNOSIS — Z170 Estrogen receptor positive status [ER+]: Secondary | ICD-10-CM

## 2020-07-24 DIAGNOSIS — Z23 Encounter for immunization: Secondary | ICD-10-CM | POA: Diagnosis not present

## 2020-07-24 DIAGNOSIS — C50412 Malignant neoplasm of upper-outer quadrant of left female breast: Secondary | ICD-10-CM | POA: Diagnosis not present

## 2020-07-24 DIAGNOSIS — Z853 Personal history of malignant neoplasm of breast: Secondary | ICD-10-CM | POA: Insufficient documentation

## 2020-07-24 DIAGNOSIS — R530 Neoplastic (malignant) related fatigue: Secondary | ICD-10-CM | POA: Diagnosis not present

## 2020-07-24 DIAGNOSIS — C210 Malignant neoplasm of anus, unspecified: Secondary | ICD-10-CM

## 2020-07-24 DIAGNOSIS — R300 Dysuria: Secondary | ICD-10-CM | POA: Diagnosis not present

## 2020-07-24 DIAGNOSIS — Z9013 Acquired absence of bilateral breasts and nipples: Secondary | ICD-10-CM | POA: Diagnosis not present

## 2020-07-24 DIAGNOSIS — I10 Essential (primary) hypertension: Secondary | ICD-10-CM | POA: Insufficient documentation

## 2020-07-24 DIAGNOSIS — Z9221 Personal history of antineoplastic chemotherapy: Secondary | ICD-10-CM | POA: Diagnosis not present

## 2020-07-24 DIAGNOSIS — Z923 Personal history of irradiation: Secondary | ICD-10-CM | POA: Diagnosis not present

## 2020-07-24 DIAGNOSIS — C50411 Malignant neoplasm of upper-outer quadrant of right female breast: Secondary | ICD-10-CM | POA: Diagnosis not present

## 2020-07-24 LAB — CMP (CANCER CENTER ONLY)
ALT: 15 U/L (ref 0–44)
AST: 18 U/L (ref 15–41)
Albumin: 3.4 g/dL — ABNORMAL LOW (ref 3.5–5.0)
Alkaline Phosphatase: 78 U/L (ref 38–126)
Anion gap: 6 (ref 5–15)
BUN: 14 mg/dL (ref 8–23)
CO2: 27 mmol/L (ref 22–32)
Calcium: 8.9 mg/dL (ref 8.9–10.3)
Chloride: 106 mmol/L (ref 98–111)
Creatinine: 0.9 mg/dL (ref 0.44–1.00)
GFR, Estimated: >60 mL/min (ref >60)
Glucose, Bld: 97 mg/dL (ref 70–99)
Potassium: 3.7 mmol/L (ref 3.5–5.1)
Sodium: 139 mmol/L (ref 135–145)
Total Bilirubin: 0.5 mg/dL (ref 0.3–1.2)
Total Protein: 6.6 g/dL (ref 6.5–8.1)

## 2020-07-24 LAB — CBC WITH DIFFERENTIAL (CANCER CENTER ONLY)
Abs Immature Granulocytes: 0.01 10*3/uL (ref 0.00–0.07)
Basophils Absolute: 0 10*3/uL (ref 0.0–0.1)
Basophils Relative: 0 %
Eosinophils Absolute: 0.1 10*3/uL (ref 0.0–0.5)
Eosinophils Relative: 2 %
HCT: 38.2 % (ref 36.0–46.0)
Hemoglobin: 12.1 g/dL (ref 12.0–15.0)
Immature Granulocytes: 0 %
Lymphocytes Relative: 23 %
Lymphs Abs: 0.8 10*3/uL (ref 0.7–4.0)
MCH: 27.6 pg (ref 26.0–34.0)
MCHC: 31.7 g/dL (ref 30.0–36.0)
MCV: 87 fL (ref 80.0–100.0)
Monocytes Absolute: 0.4 10*3/uL (ref 0.1–1.0)
Monocytes Relative: 10 %
Neutro Abs: 2.4 10*3/uL (ref 1.7–7.7)
Neutrophils Relative %: 65 %
Platelet Count: 175 10*3/uL (ref 150–400)
RBC: 4.39 MIL/uL (ref 3.87–5.11)
RDW: 14.6 % (ref 11.5–15.5)
WBC Count: 3.7 10*3/uL — ABNORMAL LOW (ref 4.0–10.5)
nRBC: 0 % (ref 0.0–0.2)

## 2020-07-24 MED ORDER — INFLUENZA VAC A&B SA ADJ QUAD 0.5 ML IM PRSY
0.5000 mL | PREFILLED_SYRINGE | Freq: Once | INTRAMUSCULAR | Status: AC
  Administered 2020-07-24: 0.5 mL via INTRAMUSCULAR

## 2020-07-24 MED ORDER — INFLUENZA VAC A&B SA ADJ QUAD 0.5 ML IM PRSY
PREFILLED_SYRINGE | INTRAMUSCULAR | Status: AC
  Filled 2020-07-24: qty 0.5

## 2020-07-28 ENCOUNTER — Telehealth: Payer: Self-pay

## 2020-07-28 NOTE — Telephone Encounter (Signed)
error 

## 2020-08-02 DIAGNOSIS — M47816 Spondylosis without myelopathy or radiculopathy, lumbar region: Secondary | ICD-10-CM | POA: Diagnosis not present

## 2020-08-03 ENCOUNTER — Ambulatory Visit (HOSPITAL_COMMUNITY): Payer: PPO

## 2020-08-07 ENCOUNTER — Other Ambulatory Visit: Payer: Self-pay

## 2020-08-07 ENCOUNTER — Ambulatory Visit (HOSPITAL_COMMUNITY)
Admission: RE | Admit: 2020-08-07 | Discharge: 2020-08-07 | Disposition: A | Discharge status: Home / Self Care | Payer: PPO | Source: Ambulatory Visit | Attending: Hematology | Admitting: Hematology

## 2020-08-07 ENCOUNTER — Encounter (HOSPITAL_COMMUNITY): Payer: Self-pay

## 2020-08-07 DIAGNOSIS — C210 Malignant neoplasm of anus, unspecified: Secondary | ICD-10-CM | POA: Insufficient documentation

## 2020-08-07 DIAGNOSIS — C44520 Squamous cell carcinoma of anal skin: Secondary | ICD-10-CM | POA: Diagnosis not present

## 2020-08-07 HISTORY — DX: Malignant neoplasm of anus, unspecified: C21.0

## 2020-08-07 MED ORDER — IOHEXOL 300 MG/ML  SOLN
100.0000 mL | Freq: Once | INTRAMUSCULAR | Status: AC | PRN
  Administered 2020-08-07: 100 mL via INTRAVENOUS

## 2020-08-11 ENCOUNTER — Telehealth: Payer: Self-pay

## 2020-08-11 NOTE — Telephone Encounter (Signed)
I left message stating that CT scan was negative.

## 2020-08-11 NOTE — Telephone Encounter (Signed)
-----   Message from Truitt Merle, MD sent at 08/10/2020 12:39 PM EST ----- My nurse, please let pt know her CT scan was negative, good news.  Truitt Merle  08/10/2020

## 2020-08-22 DIAGNOSIS — M47816 Spondylosis without myelopathy or radiculopathy, lumbar region: Secondary | ICD-10-CM | POA: Diagnosis not present

## 2020-09-07 ENCOUNTER — Telehealth: Payer: Self-pay | Admitting: *Deleted

## 2020-09-07 ENCOUNTER — Encounter: Payer: Self-pay | Admitting: Nurse Practitioner

## 2020-09-07 ENCOUNTER — Encounter: Payer: Self-pay | Admitting: Hematology

## 2020-09-07 NOTE — Telephone Encounter (Signed)
CALLED PATIENT TO ALTER TELEPHONE FU FOR 09-11-20 DUE TO THE PROVIDER BEING ON VACATION, RESCHEDULED FOR 09-18-20 @ 1 PM, PATIENT AGREED TO NEW DATE AND TIME

## 2020-09-11 ENCOUNTER — Encounter: Payer: Self-pay | Admitting: Radiation Oncology

## 2020-09-11 ENCOUNTER — Telehealth: Payer: Self-pay

## 2020-09-11 ENCOUNTER — Ambulatory Visit: Payer: PPO | Admitting: Radiation Oncology

## 2020-09-11 ENCOUNTER — Other Ambulatory Visit: Payer: Self-pay

## 2020-09-11 NOTE — Telephone Encounter (Signed)
Spoke with patient in regards to telephone follow-up with Laurence Aly PA on 09/18/20 @ 1:00pm. Patient verbalized understanding of appointment date and time. Reviewed meaningful use questions. TM

## 2020-09-12 ENCOUNTER — Encounter: Payer: Self-pay | Admitting: Licensed Clinical Social Worker

## 2020-09-12 NOTE — Progress Notes (Signed)
[facility] Psychosocial Distress Screening Clinical Social Work  Clinical Social Work was referred by distress screening protocol.  The patient scored a 5 on the Psychosocial Distress Thermometer which indicates moderate distress. Clinical Social Worker contacted patient by phone to assess for distress and other psychosocial needs.   Patient has completed treatment but still having many physical issues which has been difficult for her to deal with. She also has other stressors with her daughter who was ill and caring for grandchild. CSW provided supportive listening and offered additional support through support group and/or connection to counseling. Patient wants to attend GI support group and then decide if she would like to speak with someone individually.    ON[facility] DISTRESS SCREENING 09/11/2020 06/17/2019  Screening Type  Initial Screening  Distress experienced in past week (1-10) 5 10  Practical problem type  Work/school  Family Problem type  Children  Emotional problem type Depression Adjusting to illness    Clinical Social Worker follow up needed: No.  If yes, follow up plan:  Marlane Hirschmann E Kaitlinn Iversen, LCSW

## 2020-09-14 DIAGNOSIS — I10 Essential (primary) hypertension: Secondary | ICD-10-CM | POA: Diagnosis not present

## 2020-09-14 DIAGNOSIS — E7800 Pure hypercholesterolemia, unspecified: Secondary | ICD-10-CM | POA: Diagnosis not present

## 2020-09-14 DIAGNOSIS — Z853 Personal history of malignant neoplasm of breast: Secondary | ICD-10-CM | POA: Diagnosis not present

## 2020-09-14 DIAGNOSIS — M150 Primary generalized (osteo)arthritis: Secondary | ICD-10-CM | POA: Diagnosis not present

## 2020-09-14 DIAGNOSIS — F331 Major depressive disorder, recurrent, moderate: Secondary | ICD-10-CM | POA: Diagnosis not present

## 2020-09-14 DIAGNOSIS — C20 Malignant neoplasm of rectum: Secondary | ICD-10-CM | POA: Diagnosis not present

## 2020-09-14 DIAGNOSIS — K219 Gastro-esophageal reflux disease without esophagitis: Secondary | ICD-10-CM | POA: Diagnosis not present

## 2020-09-14 DIAGNOSIS — F329 Major depressive disorder, single episode, unspecified: Secondary | ICD-10-CM | POA: Diagnosis not present

## 2020-09-18 ENCOUNTER — Other Ambulatory Visit: Payer: Self-pay

## 2020-09-18 ENCOUNTER — Ambulatory Visit
Admission: RE | Admit: 2020-09-18 | Discharge: 2020-09-18 | Disposition: A | Discharge status: Home / Self Care | Payer: PPO | Source: Ambulatory Visit | Attending: Radiation Oncology | Admitting: Radiation Oncology

## 2020-09-18 DIAGNOSIS — C50412 Malignant neoplasm of upper-outer quadrant of left female breast: Secondary | ICD-10-CM

## 2020-09-18 DIAGNOSIS — Z08 Encounter for follow-up examination after completed treatment for malignant neoplasm: Secondary | ICD-10-CM | POA: Diagnosis not present

## 2020-09-18 DIAGNOSIS — Z170 Estrogen receptor positive status [ER+]: Secondary | ICD-10-CM

## 2020-09-18 DIAGNOSIS — C211 Malignant neoplasm of anal canal: Secondary | ICD-10-CM | POA: Diagnosis not present

## 2020-09-18 NOTE — Progress Notes (Signed)
Radiation Oncology         ([phone]) [phone] ________________________________  Outpatient Follow Up Visit - Conducted via telephone due to current COVID-19 concerns for limiting patient exposure  I spoke with the patient to conduct this consult visit via telephone to spare the patient unnecessary potential exposure in the healthcare setting during the current COVID-19 pandemic. The patient was notified in advance and was offered a [place] meeting to allow for face to face communication but unfortunately reported that they did not have the appropriate resources/technology to support such a visit and instead preferred to proceed with a telephone visit.   Diagnosis: Squamous Cell Carcinoma of the Anus.   Interval Since Last Radiation:  1 year, 2 months  06/28/2019-08/09/2019:  The anus and regional nodes were treated to 54 Gy in 30 fractions  2008: Post lumpectomy adjuvant radiotherapy to the right breast  Narrative:  The patient was contacted today for routine follow-up. In summary this is a pleasant 70 y.o. woman with a history of squamous cell carcinoma of the anus that was treated in the fall of 2020 with chemoRT. She has been without disease since and this has been confirmed with Dr. [name] with direct anoscopy, as well as by CT imaging in November 2021 with [name] in medical oncology. The patient did have trouble with dysuria and pelvic pressure. She had multiple courses of antibiotics but no urologic evaluations. She has used Azo Standard multiple times as well. She is contacted today for follow up.   On review of systems, the patient reports that she is still having a difficult time with her bowel and bladder function.  She reports that she continues to have to use Azo-Standard intermittently for dysuria.  She reports that she is not tried using any dietary changes to try and alleviate her symptoms.  She reports that she has been using Linzess and alternating this versus Metamucil but Linzess  causes her to have what she feels like is explosive diarrhea, and the Metamucil makes her feel constipated and bloated.  She at times will have episodes of thin stools that remind her of the time that she first presented which is quite concerning to her.  She is also had episodes of bleeding from her stools.  She reports that she is using her vaginal dilator only infrequently, and that she had to stop going to physical therapy because her insurance would no longer cover this through the [place] location.  She reports that it is still uncomfortable to sit for long periods of time and that her skin is sensitive in the rectal area.  She is unsure about what her expectations should be at this point because she was hoping to have better quality of life at this time.  No other complaints are verbalized.   PAST MEDICAL HISTORY:  Past Medical History:  Diagnosis Date  . Anal cancer ([place]) dx'd 05/2019  . Anxiety   . Arthritis   . Bilateral breast cancer ([place]) 10/21/2013  . Breast cancer ([place])   . Cancer ([place])   . Depression   . Gallstones   . GERD (gastroesophageal reflux disease)    doesn't take any meds for this  . Headache    h/o migraines      . History of bladder infections   . History of hiatal hernia   . History of migraine   . Hyperlipidemia    takes Simvastatin daily  . Hypertension    takes Hyzaar  . Joint pain   .  Joint swelling   . Lumbar stenosis   . Neuropathy 09/07/2013  . Pneumonia   . Pre-diabetes     PAST SURGICAL HISTORY: Past Surgical History:  Procedure Laterality Date  . ABDOMINAL HYSTERECTOMY    . bone spur removed from left foot    . CHOLECYSTECTOMY    . COLONOSCOPY    . double mastectomy     . HERNIA REPAIR     umbilical  . right knee arthroscopy    . right shoulder arthroscopy    . surgery for hiatal hernia    . TONSILLECTOMY    . TOTAL HIP ARTHROPLASTY Left 02/12/2013  . TOTAL HIP ARTHROPLASTY Left 02/12/2013   Procedure: LEFT TOTAL HIP ARTHROPLASTY;   Surgeon: Kerin Salen, MD;  Location: [facility];  Service: Orthopedics;  Laterality: Left;  . TOTAL HIP ARTHROPLASTY Right 05/03/2013   Procedure: TOTAL HIP ARTHROPLASTY;  Surgeon: Kerin Salen, MD;  Location: [facility];  Service: Orthopedics;  Laterality: Right;  . TOTAL KNEE ARTHROPLASTY Right 02/27/2015   Procedure: TOTAL KNEE ARTHROPLASTY;  Surgeon: Frederik Pear, MD;  Location: [facility];  Service: Orthopedics;  Laterality: Right;  . TOTAL SHOULDER ARTHROPLASTY Right 09/12/2016   Procedure: RIGHT TOTAL SHOULDER ARTHROPLASTY;  Surgeon: Tania Ade, MD;  Location: [facility];  Service: Orthopedics;  Laterality: Right;  RIGHT TOTAL SHOULDER ARTHROPLASTY    PAST SOCIAL HISTORY:  Social History   Socioeconomic History  . Marital status: Single    Spouse name: Not on file  . Number of children: 1  . Years of education: Not on file  . Highest education level: Not on file  Occupational History  . Not on file  Tobacco Use  . Smoking status: Never Smoker  . Smokeless tobacco: Never Used  Vaping Use  . Vaping Use: Never used  Substance and Sexual Activity  . Alcohol use: No    Comment: rare  . Drug use: No  . Sexual activity: Not Currently  Other Topics Concern  . Not on file  Social History Narrative  . Not on file   Social Determinants of Health   Financial Resource Strain: Not on file  Food Insecurity: Not on file  Transportation Needs: Not on file  Physical Activity: Not on file  Stress: Not on file  Social Connections: Not on file  Intimate Partner Violence: Not on file  The patient is single and lives in [place]. 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 She had been working remotely since [employer] but discontinued due to side effects from treatment.   PAST FAMILY HISTORY: Family History  Adopted: Yes  Problem Relation Age of Onset  . Allergic rhinitis Neg Hx   . Angioedema Neg Hx   . Atopy Neg Hx   . Eczema Neg Hx   . Immunodeficiency Neg Hx    . Urticaria Neg Hx   . Asthma Neg Hx     MEDICATIONS  Current Outpatient Medications  Medication Sig Dispense Refill  . LINZESS 72 MCG capsule     . losartan (COZAAR) 100 MG tablet TK 1 T PO D  6  . ondansetron (ZOFRAN ODT) 4 MG disintegrating tablet Take 1 tablet (4 mg total) by mouth every 8 (eight) hours as needed for nausea or vomiting. 6 tablet 0  . PROAIR HFA 108 (90 Base) MCG/ACT inhaler 2 inhalations every 4-6 hours as needed. (Patient taking differently: Inhale 2 puffs into the lungs every 6 (six) hours as needed for  wheezing or shortness of breath.) 18 g 1  . rosuvastatin (CRESTOR) 40 MG tablet Take 40 mg by mouth daily.     No current facility-administered medications for this encounter.    ALLERGIES:  Allergies  Allergen Reactions  . Oxycodone-Acetaminophen Hives and Itching  . Codeine Itching and Rash  . Vicodin [Hydrocodone-Acetaminophen] Rash   Physical Exam: Unable to assess due to encounter type.  Impression/Plan: 1. Squamous Cell Carcinoma of the Anus.  The patient appears to be radiographically without evidence of disease and clinically without disease per report from discussions with her other providers, she is quite disappointed in her quality of life given that she has persistent symptoms that are most likely related to prior chemoradiation.  She unfortunately does not qualify for physical therapy within the [facility] system any longer because her insurance was exhausted following this but she has not been able to continue her routine dilator use with regularity.  I suggested that we get her into a urogynecologist and Dr. Ivor Costa has a clinic space now in [place].  A referral will be placed.  Dr. Andrey Campanile has retired but the patient is interested in seeing Dr. Bosie Clos to see if there are any other medication management options for her bowel dysfunction.  She still understands the importance of direct endoscopy evaluation with Dr. Michaell Cowing and we will plan to do this  again in March.  I did also encourage her to try and resume using vaginal dilators at least twice weekly.  She will also plan to follow-up with Dr. Elodia Florence.  I will ask our nurse to check in on her in a few weeks to make sure that these appointments have been scheduled, in a few months to see how she is doing at that point.   Given current concerns for patient exposure during the COVID-19 pandemic, this encounter was conducted via telephone.  The patient has provided two factor identification and has given verbal consent for this type of encounter and has been advised to only accept a meeting of this type in a secure network environment. The time spent during this encounter was 45 minutes including preparation, discussion, and coordination of the patient's care. The attendants for this meeting include  Ronny Bacon  and [name].  During the encounter,  Ronny Bacon was located at [facility] Radiation Oncology Department.  Arleen Bar was located at home.      Osker Mason, PAC

## 2020-09-25 ENCOUNTER — Telehealth: Payer: Self-pay | Admitting: *Deleted

## 2020-09-25 DIAGNOSIS — M47816 Spondylosis without myelopathy or radiculopathy, lumbar region: Secondary | ICD-10-CM | POA: Diagnosis not present

## 2020-09-25 NOTE — Telephone Encounter (Signed)
Called this patient to inform of appt. with Dr. Damian Leavell on 09-27-20 - arrival time- 9 am @ [place], phone number [phone] and her appt. with Dr. Hilliard Clark on Jan. 13 arrival time- 9:15 am, spoke with patient and she is aware of these appts.

## 2020-09-27 DIAGNOSIS — N3281 Overactive bladder: Secondary | ICD-10-CM | POA: Diagnosis not present

## 2020-09-27 DIAGNOSIS — E7800 Pure hypercholesterolemia, unspecified: Secondary | ICD-10-CM | POA: Diagnosis present

## 2020-09-27 DIAGNOSIS — N952 Postmenopausal atrophic vaginitis: Secondary | ICD-10-CM | POA: Diagnosis not present

## 2020-09-27 DIAGNOSIS — N895 Stricture and atresia of vagina: Secondary | ICD-10-CM | POA: Diagnosis not present

## 2020-09-27 DIAGNOSIS — K219 Gastro-esophageal reflux disease without esophagitis: Secondary | ICD-10-CM | POA: Diagnosis present

## 2020-09-27 DIAGNOSIS — F331 Major depressive disorder, recurrent, moderate: Secondary | ICD-10-CM | POA: Insufficient documentation

## 2020-09-27 DIAGNOSIS — R152 Fecal urgency: Secondary | ICD-10-CM | POA: Diagnosis not present

## 2020-09-27 DIAGNOSIS — N393 Stress incontinence (female) (male): Secondary | ICD-10-CM | POA: Diagnosis not present

## 2020-09-27 DIAGNOSIS — K2990 Gastroduodenitis, unspecified, without bleeding: Secondary | ICD-10-CM | POA: Insufficient documentation

## 2020-09-27 DIAGNOSIS — Z9013 Acquired absence of bilateral breasts and nipples: Secondary | ICD-10-CM | POA: Insufficient documentation

## 2020-09-27 DIAGNOSIS — R159 Full incontinence of feces: Secondary | ICD-10-CM | POA: Diagnosis not present

## 2020-09-27 DIAGNOSIS — R739 Hyperglycemia, unspecified: Secondary | ICD-10-CM | POA: Diagnosis present

## 2020-09-27 DIAGNOSIS — I10 Essential (primary) hypertension: Secondary | ICD-10-CM | POA: Diagnosis present

## 2020-09-27 DIAGNOSIS — R319 Hematuria, unspecified: Secondary | ICD-10-CM | POA: Diagnosis not present

## 2020-09-28 DIAGNOSIS — R159 Full incontinence of feces: Secondary | ICD-10-CM | POA: Insufficient documentation

## 2020-10-05 ENCOUNTER — Encounter: Payer: Self-pay | Admitting: Hematology

## 2020-10-05 DIAGNOSIS — M47816 Spondylosis without myelopathy or radiculopathy, lumbar region: Secondary | ICD-10-CM | POA: Diagnosis not present

## 2020-10-11 DIAGNOSIS — Z20822 Contact with and (suspected) exposure to covid-19: Secondary | ICD-10-CM | POA: Diagnosis not present

## 2020-10-12 DIAGNOSIS — R350 Frequency of micturition: Secondary | ICD-10-CM | POA: Diagnosis not present

## 2020-10-12 DIAGNOSIS — N3281 Overactive bladder: Secondary | ICD-10-CM | POA: Diagnosis not present

## 2020-10-12 DIAGNOSIS — R32 Unspecified urinary incontinence: Secondary | ICD-10-CM | POA: Diagnosis not present

## 2020-10-12 DIAGNOSIS — R351 Nocturia: Secondary | ICD-10-CM | POA: Diagnosis not present

## 2020-11-10 DIAGNOSIS — Z853 Personal history of malignant neoplasm of breast: Secondary | ICD-10-CM | POA: Diagnosis not present

## 2020-11-10 DIAGNOSIS — E7800 Pure hypercholesterolemia, unspecified: Secondary | ICD-10-CM | POA: Diagnosis not present

## 2020-11-10 DIAGNOSIS — F329 Major depressive disorder, single episode, unspecified: Secondary | ICD-10-CM | POA: Diagnosis not present

## 2020-11-10 DIAGNOSIS — M150 Primary generalized (osteo)arthritis: Secondary | ICD-10-CM | POA: Diagnosis not present

## 2020-11-10 DIAGNOSIS — K219 Gastro-esophageal reflux disease without esophagitis: Secondary | ICD-10-CM | POA: Diagnosis not present

## 2020-11-10 DIAGNOSIS — C20 Malignant neoplasm of rectum: Secondary | ICD-10-CM | POA: Diagnosis not present

## 2020-11-10 DIAGNOSIS — F331 Major depressive disorder, recurrent, moderate: Secondary | ICD-10-CM | POA: Diagnosis not present

## 2020-11-10 DIAGNOSIS — I10 Essential (primary) hypertension: Secondary | ICD-10-CM | POA: Diagnosis not present

## 2020-11-22 ENCOUNTER — Telehealth: Payer: Self-pay

## 2020-11-22 NOTE — Telephone Encounter (Signed)
No note

## 2020-11-30 DIAGNOSIS — R3129 Other microscopic hematuria: Secondary | ICD-10-CM | POA: Diagnosis not present

## 2020-11-30 DIAGNOSIS — R339 Retention of urine, unspecified: Secondary | ICD-10-CM | POA: Diagnosis not present

## 2020-11-30 DIAGNOSIS — N3946 Mixed incontinence: Secondary | ICD-10-CM | POA: Diagnosis not present

## 2020-11-30 DIAGNOSIS — R152 Fecal urgency: Secondary | ICD-10-CM | POA: Diagnosis not present

## 2020-11-30 DIAGNOSIS — R159 Full incontinence of feces: Secondary | ICD-10-CM | POA: Diagnosis not present

## 2020-12-18 DIAGNOSIS — C211 Malignant neoplasm of anal canal: Secondary | ICD-10-CM | POA: Diagnosis not present

## 2020-12-18 DIAGNOSIS — K430 Incisional hernia with obstruction, without gangrene: Secondary | ICD-10-CM | POA: Diagnosis not present

## 2020-12-18 DIAGNOSIS — K581 Irritable bowel syndrome with constipation: Secondary | ICD-10-CM | POA: Diagnosis not present

## 2020-12-18 DIAGNOSIS — M533 Sacrococcygeal disorders, not elsewhere classified: Secondary | ICD-10-CM | POA: Diagnosis not present

## 2020-12-18 DIAGNOSIS — K624 Stenosis of anus and rectum: Secondary | ICD-10-CM | POA: Diagnosis not present

## 2020-12-19 DIAGNOSIS — H35033 Hypertensive retinopathy, bilateral: Secondary | ICD-10-CM | POA: Diagnosis not present

## 2020-12-21 ENCOUNTER — Telehealth: Payer: Self-pay | Admitting: Radiation Oncology

## 2020-12-21 NOTE — Telephone Encounter (Signed)
I tried to reach the patient today to discuss her symptoms and to check in on her overall progress with her pelvic floor symptoms. I had to leave a voicemail for her asking her to return my call.    Of note, She has been seeing urogynecology at [facility]. Also, Dr. [name] had reached out to [name] as he is concerned her pelvic pain symptoms are neuropathic in nature. The nerves of the pelvis are known to tolerate radiotherapy to a higher threshold than her prior prescription, so [name] did not feel her symptoms were the direct result of nerve injury from radiation, however was supportive in the use or trial of nerve based medication.

## 2021-01-15 DIAGNOSIS — N3281 Overactive bladder: Secondary | ICD-10-CM | POA: Diagnosis not present

## 2021-01-15 DIAGNOSIS — R3129 Other microscopic hematuria: Secondary | ICD-10-CM | POA: Diagnosis not present

## 2021-01-15 DIAGNOSIS — R159 Full incontinence of feces: Secondary | ICD-10-CM | POA: Diagnosis not present

## 2021-01-17 NOTE — Progress Notes (Signed)
Cynthia Velasquez   [phone] [phone]   Clinic Follow up Note   Patient Care Team: Kathyrn Lass, MD as PCP - General (Family Medicine) Wonda Horner, MD as Consulting Physician (Gastroenterology) Truitt Merle, MD as Consulting Physician (Hematology) Alla Feeling, NP as Nurse Practitioner (Nurse Practitioner) Kyung Rudd, MD as Consulting Physician (Radiation Oncology) Michael Boston, MD as Consulting Physician (General Surgery)  Date of Service:  01/22/2021  CHIEF COMPLAINT: f/u of rectal cancer  SUMMARY OF ONCOLOGIC HISTORY: Oncology History Overview Note  Breast cancer of upper-outer quadrant of right female breast, (ER/PR positive, her2/Neu negative)   Primary site: Breast (Right)   Staging method: AJCC 7th Edition   Clinical: Stage IA (T1a, N0, cM0) signed by [name], MD on 09/07/2013 11:14 AM   Pathologic: Stage IA (T1a, N0, cM0) signed by [name], MD on 09/07/2013 11:14 AM   Summary: Stage IA (T1a, N0, cM0)  Breast cancer of upper-outer quadrant of left female breast (ER/PR/Her2Neu negative)   Primary site: Breast (Left)   Staging method: AJCC 7th Edition   Clinical: Stage IIA (T2, N0, cM0) signed by [name], MD on 09/07/2013 11:35 AM   Pathologic: Stage IIA (T2, N0, cM0) signed by [name], MD on 09/07/2013 11:35 AM   Summary: Stage IIA (T2, N0, cM0)     Breast cancer of upper-outer quadrant of left female breast ([place])  07/10/2007 Procedure   US biopsy showed invasive ductal carcinoma 0.8cm, Nottingham grade 3   07/30/2007 Surgery   She had left breast lumpectomy and LN biopsy which showed high grade invasive ductal cancer Nottingham grade 3, 2.7 cm, ER/PR/Her2 neu negative   11/24/2007 Surgery   Patient elected for bilateral mastectomy   09/07/2008 - 03/08/2009 Chemotherapy   dates are approximate: she received adriamycin and cytoxan followed by Taxol   03/08/2009 - 05/08/2009 Radiation Therapy   dates are approximate,  she received XRT    Breast cancer of upper-outer quadrant of right female breast ([place])  07/10/2006 Procedure   stereotactic biopsy showed atypical hyperplasia   10/23/2006 Surgery   right lumpectomy showed invasive ductal ca (Nottingham grade 1)  25m and DCIS, ER/PR positive Her 2 negative   10/09/2007 - 10/08/2012 Chemotherapy   Patient was placed on Tamoxifen   03/24/2017 Imaging   No acute findings. No evidence of recurrent carcinoma or metastatic disease   Anal cancer ([facility]  06/04/2019 Procedure   Colonoscopy per Dr. SAnson Fret Findings-the digital rectal exam revealed a 3 cm diameter firm rectal mass.  The mass was noncircumferential and located predominantly at the right bowel wall at the anorectal junction. A nonobstructing mass was found at the anus and in the rectum    06/04/2019 Initial Biopsy   Follow pathology: Large intestine, rectum biopsy: Invasive well to moderately differentiated squamous cell carcinoma.  No rectal mucosa present.  There is strong diffuse expression of P 16 immunostain.  CDX 2, p63 and mCEA immunostains are also used in the diagnostic work-up of the case.   06/04/2019 Initial Diagnosis   Anal cancer ([facility]   06/21/2019 PET scan   IMPRESSION: 1. Anorectal primary. No hypermetabolic metastatic disease within the chest, abdomen, or pelvis. Perirectal nodes, at least 1 of which is new since 02/26/2018 CT, suspicious based on size and interval development. 2. Mild limitations secondary to beam hardening artifact from bilateral hip arthroplasty. 3.  Aortic Atherosclerosis (ICD10-I70.0).   06/28/2019 Cancer Staging   Staging form: Anus, AJCC 8th Edition -  Clinical stage from 06/28/2019: Stage IIA (cT2, cN0, cM0) - Signed by Alla Feeling, NP on 06/28/2019   06/28/2019 - 07/26/2019 Chemotherapy   concurrent chemoRT with Mitomycin and 5FU on week 1 and week 5 on starting 06/28/19. Last dose on 07/26/19   06/28/2019 - 08/09/2019 Radiation Therapy    concurrent chemoRT with Dr [name] 06/28/19-08/09/19   11/01/2019 PET scan   IMPRESSION: 1. Marked interval decrease in hypermetabolism noted at the level of the anal rectal primary. No evidence for hypermetabolic metastatic disease in the chest, abdomen, or pelvis. The perirectal lymph nodes identified previously have resolved in the interval. 2.  Aortic Atherosclerois (ICD10-170.0)   08/07/2020 Imaging   CT CAP  IMPRESSION: 1. No findings for residual/recurrent anal tumor, regional lymphadenopathy or metastatic disease. 2. Status post cholecystectomy. No biliary dilatation. 3. Small anterior abdominal wall hernia containing fat. 4. Aortic atherosclerosis.   Aortic Atherosclerosis (ICD10-I70.0).        CURRENT THERAPY:  Surveillance  INTERVAL HISTORY:  Cynthia Velasquez is here for a follow up of rectal cancer. She presents to the clinic alone. She notes last month she started having GI symptoms of rectal urgency without output. She was seen by Dr [name] who examined her, nothing was found. She notes for 2 weeks after her exam, she had diarrhea. With 1 imodium to help the diarrhea, she has been constipated since. She was started on Linzess and Miralax, but this has not helped so far even with dose increase. She notes her GI Dr Lloyd Huger has retired. Her last colonoscopy was 2021 before Dr Penelope Coop retired.   She notes her low back pain persists and occurs all the time. This is exacerbated by touch.    REVIEW OF SYSTEMS:   Constitutional: Denies fevers, chills or abnormal weight loss Eyes: Denies blurriness of vision Ears, nose, mouth, throat, and face: Denies mucositis or sore throat Respiratory: Denies cough, dyspnea or wheezes Cardiovascular: Denies palpitation, chest discomfort or lower extremity swelling Gastrointestinal:  Denies nausea, heartburn (+) Diarrhea/constipation  Skin: Denies abnormal skin rashes MSK: (+) Low back pain Lymphatics: Denies new lymphadenopathy or easy  bruising Neurological:Denies numbness, tingling or new weaknesses Behavioral/Psych: Mood is stable, no new changes  All other systems were reviewed with the patient and are negative.  MEDICAL HISTORY:  Past Medical History:  Diagnosis Date  . Anal cancer ([place]) dx'd 05/2019  . Anxiety   . Arthritis   . Bilateral breast cancer ([place]) 10/21/2013  . Breast cancer ([place])   . Cancer ([place])   . Depression   . Gallstones   . GERD (gastroesophageal reflux disease)    doesn't take any meds for this  . Headache    h/o migraines      . History of bladder infections   . History of hiatal hernia   . History of migraine   . Hyperlipidemia    takes Simvastatin daily  . Hypertension    takes Hyzaar  . Joint pain   . Joint swelling   . Lumbar stenosis   . Neuropathy 09/07/2013  . Pneumonia   . Pre-diabetes     SURGICAL HISTORY: Past Surgical History:  Procedure Laterality Date  . ABDOMINAL HYSTERECTOMY    . bone spur removed from left foot    . CHOLECYSTECTOMY    . COLONOSCOPY    . double mastectomy     . HERNIA REPAIR     umbilical  . right knee arthroscopy    . right shoulder arthroscopy    .  surgery for hiatal hernia    . TONSILLECTOMY    . TOTAL HIP ARTHROPLASTY Left 02/12/2013  . TOTAL HIP ARTHROPLASTY Left 02/12/2013   Procedure: LEFT TOTAL HIP ARTHROPLASTY;  Surgeon: Kerin Salen, MD;  Location: [facility];  Service: Orthopedics;  Laterality: Left;  . TOTAL HIP ARTHROPLASTY Right 05/03/2013   Procedure: TOTAL HIP ARTHROPLASTY;  Surgeon: Kerin Salen, MD;  Location: [facility];  Service: Orthopedics;  Laterality: Right;  . TOTAL KNEE ARTHROPLASTY Right 02/27/2015   Procedure: TOTAL KNEE ARTHROPLASTY;  Surgeon: Frederik Pear, MD;  Location: [facility];  Service: Orthopedics;  Laterality: Right;  . TOTAL SHOULDER ARTHROPLASTY Right 09/12/2016   Procedure: RIGHT TOTAL SHOULDER ARTHROPLASTY;  Surgeon: Tania Ade, MD;  Location: [facility];  Service: Orthopedics;  Laterality: Right;  RIGHT TOTAL  SHOULDER ARTHROPLASTY    I have reviewed the social history and family history with the patient and they are unchanged from previous note.  ALLERGIES:  is allergic to oxycodone-acetaminophen, codeine, and vicodin [hydrocodone-acetaminophen].  MEDICATIONS:  Current Outpatient Medications  Medication Sig Dispense Refill  . LINZESS 72 MCG capsule     . losartan (COZAAR) 100 MG tablet TK 1 T PO D  6  . ondansetron (ZOFRAN ODT) 4 MG disintegrating tablet Take 1 tablet (4 mg total) by mouth every 8 (eight) hours as needed for nausea or vomiting. 6 tablet 0  . PROAIR HFA 108 (90 Base) MCG/ACT inhaler 2 inhalations every 4-6 hours as needed. (Patient taking differently: Inhale 2 puffs into the lungs every 6 (six) hours as needed for wheezing or shortness of breath.) 18 g 1  . rosuvastatin (CRESTOR) 40 MG tablet Take 40 mg by mouth daily.     No current facility-administered medications for this visit.    PHYSICAL EXAMINATION: ECOG PERFORMANCE STATUS: 1 - Symptomatic but completely ambulatory  Vitals:   01/22/21 0848  BP: (!) 161/95  Pulse: 68  Resp: 18  Temp: 97.6 F (36.4 C)  SpO2: 99%   Filed Weights   01/22/21 0848  Weight: 222 lb 4.8 oz (100.8 kg)    GENERAL:alert, no distress and comfortable SKIN: skin color, texture, turgor are normal, no rashes or significant lesions EYES: normal, Conjunctiva are pink and non-injected, sclera clear  NECK: supple, thyroid normal size, non-tender, without nodularity LYMPH:  no palpable lymphadenopathy in the cervical, axillary  LUNGS: clear to auscultation and percussion with normal breathing effort HEART: regular rate & rhythm and no murmurs and no lower extremity edema ABDOMEN:abdomen soft, non-tender and normal bowel sounds (+) Mid abdominal tenderness, RUQ tenderness Musculoskeletal:no cyanosis of digits and no clubbing  NEURO: alert & oriented x 3 with fluent speech, no focal motor/sensory deficits  LABORATORY DATA:  I have reviewed  the data as listed CBC Latest Ref Rng & Units 01/22/2021 07/24/2020 03/22/2020  WBC 4.0 - 10.5 K/uL 4.3 3.7(L) 3.2(L)  Hemoglobin 12.0 - 15.0 g/dL 13.1 12.1 12.2  Hematocrit 36.0 - 46.0 % 41.4 38.2 38.9  Platelets 150 - 400 K/uL 172 175 164     CMP Latest Ref Rng & Units 01/22/2021 07/24/2020 03/22/2020  Glucose 70 - 99 mg/dL 116(H) 97 93  BUN 8 - 23 mg/dL '16 14 16  ' Creatinine 0.44 - 1.00 mg/dL 0.99 0.90 0.96  Sodium 135 - 145 mmol/L 143 139 142  Potassium 3.5 - 5.1 mmol/L 4.0 3.7 3.9  Chloride 98 - 111 mmol/L 108 106 109  CO2 22 - 32 mmol/L '26 27 25  ' Calcium 8.9 - 10.3 mg/dL  9.2 8.9 9.5  Total Protein 6.5 - 8.1 g/dL 7.0 6.6 7.0  Total Bilirubin 0.3 - 1.2 mg/dL 0.4 0.5 0.4  Alkaline Phos 38 - 126 U/L 98 78 87  AST 15 - 41 U/L '15 18 18  ' ALT 0 - 44 U/L '9 15 14      ' RADIOGRAPHIC STUDIES: I have personally reviewed the radiological images as listed and agreed with the findings in the report. No results found.   ASSESSMENT & PLAN:  Cynthia Velasquez is a 70 y.o. female with    1. Anal Squamous Cell Carcinoma,cT2N0M0 -She was diagnosed in 05/2019.Workup showeda 3 cm mass at the anorectal junction, biopsy confirmed well to moderately differentiated squamous cell carcinoma.10/5/20PET scan showed no metastasis. -Shecompletedstandard treatment withconcurrent chemoRT with Mitomycin and 5FU on 08/09/19. -PET scan on 11/01/19 showednear complete response.  Surveillance CT scan from November 2021 showed no evidence of residual or recurrent cancer. -She notes in the past month having GI symptoms. She also has mid and RUQ tenderness on exam and significant ongoing lower back pain. Given history of b/l breast and anal cancer, I recommend further evaluation. She is agreeable.  -I recommend she find new physician in [facility] since Dr. Penelope Coop retired. I also suggest MRI spine. She is agreeable.  -Continue Surveillance. -F/u in 3 months for closer follow up.   2. Constipation, bloating   -She notes last month she started having GI symptoms of rectal urgency without output. She was seen by Dr [name] who examined her, nothing was found.  -She notes for 2 weeks after her exam, she had diarrhea. With 1 imodium to help the diarrhea, she has been constipated since.  -She has been on Linzess before along with Miralax, but this has not helped so far even with dose increase. She notes her GI Dr Lloyd Huger has retired. Her last colonoscopy was 2021 before Dr Penelope Coop retired.  -I recommend she try to increase miralax dose BID and to find new GI without the same group for management.   3.H/o bilateral breast cancer, Genetic negative  2007: right breast biopsy showed atypical hyperplasia, s/p right lumpectomy in 06/2006 ER/PR+/HER2-, grade 1. Completed tamoxifen from 09/2007 - 09/2012 2008: left breast invasive ductal carcinoma, triple negative, grade 3 s/p lumpectomy in 07/2007 and eventual bilateral mastectomy; s/p adjuvant chemo AC-T and radiation -Per pt her GeneticsTesting was negative. She was adopted,no known family history   4. HTN -on losartan. Continue to f/u with PCP  5. Back pain  -She notes for the past 2 months having sacral back pain that radiates and is exacerbated by sitting or standing for too long.  -She plans to f/u with Ortho Dr Mina Marble  -Her 07/2020 CT CAP was negative.  -Her pain persists constantly and exacerbated by touch. I will obtain MRI spine. She is agreeable.    PLAN: -MRI spine in 2-3 weeks. I will call her with result  -She will call [facility] for follow-up appointment. -Lab and f/u in 3 months or sooner if needed     No problem-specific Assessment & Plan notes found for this encounter.   Orders Placed This Encounter  Procedures  . MR LUMBAR SPINE W WO CONTRAST    Low back pain for 6 months, history of breast and rectal cancer, rule out recurrence    Standing Status:   Future    Standing Expiration Date:   01/22/2022    Order Specific Question:    If indicated for the ordered procedure, I  authorize the administration of contrast media per Radiology protocol    Answer:   Yes    Order Specific Question:   What is the patient's sedation requirement?    Answer:   No Sedation    Order Specific Question:   Does the patient have a pacemaker or implanted devices?    Answer:   No    Order Specific Question:   Use SRS Protocol?    Answer:   No    Order Specific Question:   Preferred imaging location?    Answer:   [facility] (table limit - 550 lbs)   All questions were answered. The patient knows to call the clinic with any problems, questions or concerns. No barriers to learning was detected. The total time spent in the appointment was 30 minutes.     Truitt Merle, MD 01/22/2021   I, Joslyn Devon, am acting as scribe for Truitt Merle, MD.   I have reviewed the above documentation for accuracy and completeness, and I agree with the above.

## 2021-01-22 ENCOUNTER — Encounter: Payer: Self-pay | Admitting: Hematology

## 2021-01-22 ENCOUNTER — Inpatient Hospital Stay: Payer: PPO | Attending: Hematology | Admitting: Hematology

## 2021-01-22 ENCOUNTER — Inpatient Hospital Stay: Payer: PPO

## 2021-01-22 ENCOUNTER — Other Ambulatory Visit: Payer: Self-pay

## 2021-01-22 VITALS — BP 161/95 | HR 68 | Temp 97.6°F | Resp 18 | Ht 65.0 in | Wt 222.3 lb

## 2021-01-22 DIAGNOSIS — Z170 Estrogen receptor positive status [ER+]: Secondary | ICD-10-CM

## 2021-01-22 DIAGNOSIS — K5900 Constipation, unspecified: Secondary | ICD-10-CM | POA: Insufficient documentation

## 2021-01-22 DIAGNOSIS — Z853 Personal history of malignant neoplasm of breast: Secondary | ICD-10-CM | POA: Diagnosis not present

## 2021-01-22 DIAGNOSIS — Z9221 Personal history of antineoplastic chemotherapy: Secondary | ICD-10-CM | POA: Diagnosis not present

## 2021-01-22 DIAGNOSIS — I10 Essential (primary) hypertension: Secondary | ICD-10-CM | POA: Insufficient documentation

## 2021-01-22 DIAGNOSIS — Z923 Personal history of irradiation: Secondary | ICD-10-CM | POA: Insufficient documentation

## 2021-01-22 DIAGNOSIS — Z9013 Acquired absence of bilateral breasts and nipples: Secondary | ICD-10-CM | POA: Insufficient documentation

## 2021-01-22 DIAGNOSIS — C50412 Malignant neoplasm of upper-outer quadrant of left female breast: Secondary | ICD-10-CM

## 2021-01-22 DIAGNOSIS — M545 Low back pain, unspecified: Secondary | ICD-10-CM | POA: Insufficient documentation

## 2021-01-22 DIAGNOSIS — C210 Malignant neoplasm of anus, unspecified: Secondary | ICD-10-CM | POA: Diagnosis not present

## 2021-01-22 LAB — CMP (CANCER CENTER ONLY)
ALT: 9 U/L (ref 0–44)
AST: 15 U/L (ref 15–41)
Albumin: 3.7 g/dL (ref 3.5–5.0)
Alkaline Phosphatase: 98 U/L (ref 38–126)
Anion gap: 9 (ref 5–15)
BUN: 16 mg/dL (ref 8–23)
CO2: 26 mmol/L (ref 22–32)
Calcium: 9.2 mg/dL (ref 8.9–10.3)
Chloride: 108 mmol/L (ref 98–111)
Creatinine: 0.99 mg/dL (ref 0.44–1.00)
GFR, Estimated: >60 mL/min (ref >60)
Glucose, Bld: 116 mg/dL — ABNORMAL HIGH (ref 70–99)
Potassium: 4 mmol/L (ref 3.5–5.1)
Sodium: 143 mmol/L (ref 135–145)
Total Bilirubin: 0.4 mg/dL (ref 0.3–1.2)
Total Protein: 7 g/dL (ref 6.5–8.1)

## 2021-01-22 LAB — CBC WITH DIFFERENTIAL (CANCER CENTER ONLY)
Abs Immature Granulocytes: 0 10*3/uL (ref 0.00–0.07)
Basophils Absolute: 0 10*3/uL (ref 0.0–0.1)
Basophils Relative: 1 %
Eosinophils Absolute: 0.1 10*3/uL (ref 0.0–0.5)
Eosinophils Relative: 2 %
HCT: 41.4 % (ref 36.0–46.0)
Hemoglobin: 13.1 g/dL (ref 12.0–15.0)
Immature Granulocytes: 0 %
Lymphocytes Relative: 28 %
Lymphs Abs: 1.2 10*3/uL (ref 0.7–4.0)
MCH: 27.8 pg (ref 26.0–34.0)
MCHC: 31.6 g/dL (ref 30.0–36.0)
MCV: 87.9 fL (ref 80.0–100.0)
Monocytes Absolute: 0.4 10*3/uL (ref 0.1–1.0)
Monocytes Relative: 9 %
Neutro Abs: 2.7 10*3/uL (ref 1.7–7.7)
Neutrophils Relative %: 60 %
Platelet Count: 172 10*3/uL (ref 150–400)
RBC: 4.71 MIL/uL (ref 3.87–5.11)
RDW: 13.7 % (ref 11.5–15.5)
WBC Count: 4.3 10*3/uL (ref 4.0–10.5)
nRBC: 0 % (ref 0.0–0.2)

## 2021-01-24 ENCOUNTER — Telehealth: Payer: Self-pay | Admitting: Hematology

## 2021-01-24 NOTE — Telephone Encounter (Signed)
Scheduled follow-up appointment per 5/9 los. Patient is aware. ?

## 2021-01-29 DIAGNOSIS — M47816 Spondylosis without myelopathy or radiculopathy, lumbar region: Secondary | ICD-10-CM | POA: Diagnosis not present

## 2021-02-05 ENCOUNTER — Ambulatory Visit (HOSPITAL_COMMUNITY)
Admission: RE | Admit: 2021-02-05 | Discharge: 2021-02-05 | Disposition: A | Discharge status: Home / Self Care | Payer: PPO | Source: Ambulatory Visit | Attending: Hematology | Admitting: Hematology

## 2021-02-05 ENCOUNTER — Other Ambulatory Visit: Payer: Self-pay

## 2021-02-05 DIAGNOSIS — M545 Low back pain, unspecified: Secondary | ICD-10-CM | POA: Diagnosis not present

## 2021-02-05 DIAGNOSIS — C210 Malignant neoplasm of anus, unspecified: Secondary | ICD-10-CM | POA: Insufficient documentation

## 2021-02-05 MED ORDER — GADOBUTROL 1 MMOL/ML IV SOLN
10.0000 mL | Freq: Once | INTRAVENOUS | Status: AC | PRN
  Administered 2021-02-05: 10 mL via INTRAVENOUS

## 2021-02-08 ENCOUNTER — Telehealth: Payer: Self-pay

## 2021-02-08 NOTE — Telephone Encounter (Signed)
-----   Message from Truitt Merle, MD sent at 02/07/2021  7:18 AM EDT ----- Please let her know the MRI result, and follow up with orthopedics Dr. Mina Marble, thanks   Truitt Merle  02/07/2021

## 2021-02-08 NOTE — Telephone Encounter (Signed)
I spoke with MS Gerilyn Nestle and relayed Dr Ernestina Penna comments and recommendations.  She verbalized understanding.  Results of MRI faxed to Dr Mina Marble

## 2021-02-13 DIAGNOSIS — M549 Dorsalgia, unspecified: Secondary | ICD-10-CM | POA: Diagnosis not present

## 2021-02-13 DIAGNOSIS — Z6837 Body mass index (BMI) 37.0-37.9, adult: Secondary | ICD-10-CM | POA: Diagnosis not present

## 2021-02-13 DIAGNOSIS — M150 Primary generalized (osteo)arthritis: Secondary | ICD-10-CM | POA: Diagnosis not present

## 2021-02-21 DIAGNOSIS — Z1389 Encounter for screening for other disorder: Secondary | ICD-10-CM | POA: Diagnosis not present

## 2021-02-21 DIAGNOSIS — Z0000 Encounter for general adult medical examination without abnormal findings: Secondary | ICD-10-CM | POA: Diagnosis not present

## 2021-03-10 DIAGNOSIS — J020 Streptococcal pharyngitis: Secondary | ICD-10-CM | POA: Diagnosis not present

## 2021-03-13 DIAGNOSIS — Z03818 Encounter for observation for suspected exposure to other biological agents ruled out: Secondary | ICD-10-CM | POA: Diagnosis not present

## 2021-03-13 DIAGNOSIS — E860 Dehydration: Secondary | ICD-10-CM | POA: Diagnosis not present

## 2021-03-13 DIAGNOSIS — Z6835 Body mass index (BMI) 35.0-35.9, adult: Secondary | ICD-10-CM | POA: Diagnosis not present

## 2021-03-13 DIAGNOSIS — R682 Dry mouth, unspecified: Secondary | ICD-10-CM | POA: Diagnosis not present

## 2021-03-13 DIAGNOSIS — J029 Acute pharyngitis, unspecified: Secondary | ICD-10-CM | POA: Diagnosis not present

## 2021-03-16 ENCOUNTER — Encounter: Payer: Self-pay | Admitting: Hematology

## 2021-03-20 ENCOUNTER — Ambulatory Visit: Payer: PPO | Attending: Home Modifications | Admitting: Physical Therapy

## 2021-03-20 ENCOUNTER — Encounter: Payer: Self-pay | Admitting: Physical Therapy

## 2021-03-20 ENCOUNTER — Other Ambulatory Visit: Payer: Self-pay

## 2021-03-20 DIAGNOSIS — R279 Unspecified lack of coordination: Secondary | ICD-10-CM | POA: Diagnosis not present

## 2021-03-20 DIAGNOSIS — G8929 Other chronic pain: Secondary | ICD-10-CM

## 2021-03-20 DIAGNOSIS — M6281 Muscle weakness (generalized): Secondary | ICD-10-CM | POA: Diagnosis not present

## 2021-03-20 DIAGNOSIS — M5442 Lumbago with sciatica, left side: Secondary | ICD-10-CM | POA: Insufficient documentation

## 2021-03-20 NOTE — Patient Instructions (Signed)
[facility] Welcome to [facility]! Here you will find all the information you will need regarding your pool therapy. If you have further questions at any time, please call our office at [phone]. After completing your initial evaluation in the [facility], you may be eligible to complete a portion of your therapy in the pool. A typical week of therapy will consist of 1-2 typical physical therapy visits at our [facility] location and an additional session of therapy in the pool located at the [facility] at [address]. [address], [place] [address]. The phone number at the pool site is [phone]. Please call this number if you are running late or need to cancel your appointment.  Aquatic therapy will be offered on Friday afternoons. Each session will last approximately 45 minutes. All scheduling and payments for aquatic therapy sessions, including cancelations, will be done through our [facility] location.  To be eligible for aquatic therapy, these criteria must be met: You must be able to independently change in the locker room and get to the pool deck. A caregiver can come with you to help if needed. There are bleachers for a caregiver to sit on next to the pool. No one with an open wound is permitted in the pool.  Handicap parking is available in the front and there is a drop off option for even closer accessibility. Please arrive 15 minutes prior to your appointment to prepare for your pool session. You must sign in at the front desk upon your arrival. Please be sure to attend to any toileting needs prior to entering the pool. Locker rooms for changing are located to the right of the check-in desk. There is direct access to the pool deck from the locker room. You can lock your belongings in a locker but must bring a lock. Your therapist will greet you on the pool deck. There may be other swimmers in the pool at the  same time but your session is one-on-one with the therapist.   

## 2021-03-20 NOTE — Therapy (Signed)
[facility] [address], [place], [place], [address] Phone: [phone]   Fax:  [phone]  Physical Therapy Evaluation  Patient Details  Name: Cynthia Velasquez MRN: [id number] Date of Birth: 1951/07/15 Referring Provider (PT): Eilene Ghazi NP   Encounter Date: 03/20/2021   PT End of Session - 03/20/21 2005     Visit Number 1    Number of Visits 10    Date for PT Re-Evaluation 05/15/21    Authorization Type Medicare 10th visit prog note    PT Start Time 1400    PT Stop Time 7902    PT Time Calculation (min) 45 min    Activity Tolerance Patient tolerated treatment well             Past Medical History:  Diagnosis Date   Anal cancer ([place]) dx'd 05/2019   Anxiety    Arthritis    Bilateral breast cancer ([place]) 10/21/2013   Breast cancer ([place])    Cancer ([place])    Depression    Gallstones    GERD (gastroesophageal reflux disease)    doesn't take any meds for this   Headache    h/o migraines       History of bladder infections    History of hiatal hernia    History of migraine    Hyperlipidemia    takes Simvastatin daily   Hypertension    takes Hyzaar   Joint pain    Joint swelling    Lumbar stenosis    Neuropathy 09/07/2013   Pneumonia    Pre-diabetes     Past Surgical History:  Procedure Laterality Date   ABDOMINAL HYSTERECTOMY     bone spur removed from left foot     CHOLECYSTECTOMY     COLONOSCOPY     double mastectomy      HERNIA REPAIR     umbilical   right knee arthroscopy     right shoulder arthroscopy     surgery for hiatal hernia     TONSILLECTOMY     TOTAL HIP ARTHROPLASTY Left 02/12/2013   TOTAL HIP ARTHROPLASTY Left 02/12/2013   Procedure: LEFT TOTAL HIP ARTHROPLASTY;  Surgeon: Kerin Salen, MD;  Location: [place];  Service: Orthopedics;  Laterality: Left;   TOTAL HIP ARTHROPLASTY Right 05/03/2013   Procedure: TOTAL HIP ARTHROPLASTY;  Surgeon: Kerin Salen, MD;  Location: [place];   Service: Orthopedics;  Laterality: Right;   TOTAL KNEE ARTHROPLASTY Right 02/27/2015   Procedure: TOTAL KNEE ARTHROPLASTY;  Surgeon: Frederik Pear, MD;  Location: [place];  Service: Orthopedics;  Laterality: Right;   TOTAL SHOULDER ARTHROPLASTY Right 09/12/2016   Procedure: RIGHT TOTAL SHOULDER ARTHROPLASTY;  Surgeon: Tania Ade, MD;  Location: [place];  Service: Orthopedics;  Laterality: Right;  RIGHT TOTAL SHOULDER ARTHROPLASTY    There were no vitals filed for this visit.    Subjective Assessment - 03/20/21 1403     Subjective Ever since I had anal cancer diagnosis, radiation, chemo I've had incontinence issues. Low back pain across constant.     Pain with sitting, standing, hypersensitivity on back to touch.  Posterior left LE pain.  Drving aggravates. Had spinal injections early 2022 but no relief with the last one.  Burned nerve for pain relief but not improved, maybe worse.    Pertinent History using SPC when in and  out of the house;  left THR    Limitations Sitting;Walking;Standing;House hold activities    How long can you sit  comfortably? if I don't move I'm fine but upon standing I know I've overdone it;  I'm bent over.    How long can you walk comfortably? 30 minutes    Diagnostic tests PET scan and MRI no cancer    Patient Stated Goals Not to be in pain; not to rely on cane; If I wasn't hurting I would  get in the pool;  get back to quilting; walk more; cooking    Currently in Pain? Yes    Pain Score 7     Pain Location Back    Pain Orientation Right;Left    Pain Type Chronic pain    Aggravating Factors  sitting, walking , bending, driving; lying on treatment table/hard table even with knees up    Pain Relieving Factors sitting leaning forward; leaning over the back of the couch;  lying down is iffy; pain pills                [facility] PT Assessment - 03/20/21 0001       Assessment   Medical Diagnosis chronic LBP    Referring Provider (PT) Eilene Ghazi NP    Onset  Date/Surgical Date --   2 years   Next MD Visit unsure of next appt    Prior Therapy not for back      Precautions   Precaution Comments history of cancer      Restrictions   Weight Bearing Restrictions No      Balance Screen   Has the patient fallen in the past 6 months No   fell into the bathtub while cleaning it   Has the patient had a decrease in activity level because of a fear of falling?  No    Is the patient reluctant to leave their home because of a fear of falling?  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    Leisure quilting; movies      Observation/Other Assessments   Focus on Therapeutic Outcomes (FOTO)  38%      AROM   Overall AROM Comments Unable to tolerate lying down on mat table    Lumbar Flexion Able to stoop forward to pick up small item off floor    Lumbar Extension not attempted secondary to pain intensity      Strength   Overall Strength Comments Unable to single limb stand without UE support on right;  unable to single limb stand on left at all even with support +pelvic drop noted; needs UE assist with sit to stand; pt leans forward on counter to relieve pain    Right/Left Hip Right;Left    Right Hip Extension 3+/5    Right Hip ABduction 3+/5    Left Hip Extension 3+/5    Left Hip ABduction 3+/5    Lumbar Flexion 3/5   able to active transverse abs in sitting with UE push down;  1 rep of counter push up tolerated   Lumbar Extension 3+/5  Palpation   Palpation comment pt reports she is intolerant to touch so palpation not attempted      Slump test   Findings Negative                         Objective measurements completed on examination: See above findings.               PT Education - 03/20/21 2005     Education Details aquatic PT info    Person(s) Educated Patient    Methods Explanation;Handout    Comprehension Verbalized understanding              PT Short Term Goals - 03/20/21 2021       PT SHORT TERM GOAL #1   Title ind with initial HEP    Time 4    Period Weeks    Status New    Target Date 04/17/21      PT SHORT TERM GOAL #2   Title pt will report 30% improvement in LBP with rising, standing and walking    Time 4    Period Weeks    Status New               PT Long Term Goals - 03/20/21 2022       PT LONG TERM GOAL #1   Title The patient will be independent in safe self progression of HEP and/or aquatic ex program    Time 8    Period Weeks    Status New    Target Date 05/15/21      PT LONG TERM GOAL #2   Title The patient will have lumbo/pelvic/hip strength grossly 4-/5 needed to ambulate household distances without her cane    Time 8    Period Weeks    Status New      PT LONG TERM GOAL #3   Title The patient will report a 60% improvement in pain and function for driving, standing to cook and walking    Time 8    Period Weeks    Status New      PT LONG TERM GOAL #4   Title FOTO functional outcome score improved from 38% to 53%    Time 8    Period Weeks    Status New      PT LONG TERM GOAL #5   Title Pt will be able to exercise consistently for at least 20 minutes 2-3  days/week due to improved function and energy levels    Time 8    Period Weeks    Status New                    Plan - 03/20/21 2006     Clinical Impression Statement The patient complains of a 2 year history of pain across her low back as well as left posterior LE pain following her bout with colorectal cancer, radiation and chemotherapy.  She reports she is worsened with standing, walking,  rising after sitting a while, driving, lying on a treatment or exam table and anyone touching her back.  She states she is better with pain medication, sitting and leaning forward and leaning over the edge of the couch.  She uses a single point cane for household and community ambulation.    Extensive repeated movements or supine ROM not tested secondary to pain intensity and subjective reports of pain aggravation.  She requires UE assist with sit  to stand.  Able to stoop using hip hinge method and slight knee bend.  She is unable to single leg stand on left LE (signif pelvic drop noted), able to do for 3 sec on right but with UE support).  Trunk, hip, knee strength grossly 3+/5.  She complains of increased LBP with short duration standing and following evaluation.  Recommend aquatic therapy secondary to limited tolerance for exercise. Land based PT will be low- level graded ex in sitting and standing.    Personal Factors and Comorbidities Comorbidity 1;Comorbidity 2;Comorbidity 3+;Time since onset of injury/illness/exacerbation    Comorbidities chronic history of LBP of 2 years; history of colorectal cancer and breast cancer; hip THR    Examination-Activity Limitations Locomotion Level;Lift;Stand;Stairs;Hygiene/Grooming    Examination-Participation Restrictions Meal Prep;Cleaning;Community Activity;Shop;Driving    Stability/Clinical Decision Making Evolving/Moderate complexity    Clinical Decision Making Moderate    Rehab Potential Good    PT Frequency 2x / week    PT Duration 8 weeks    PT Treatment/Interventions ADLs/Self Care Home Management;Aquatic Therapy;Electrical Stimulation;Moist Heat;Traction;Functional mobility training;Therapeutic activities;Therapeutic exercise;Neuromuscular re-education;Manual techniques;Patient/family education;Dry needling;Taping    PT Next Visit Plan aquatic PT; seated and standing low-level graded ex for core and LE strengthening    PT Home Exercise Plan initate next  vist    Consulted and Agree with Plan of Care Patient             Patient will benefit from skilled therapeutic intervention in order to improve the following deficits and impairments:  Decreased range of motion, Decreased activity tolerance, Pain, Decreased strength, Decreased mobility  Visit Diagnosis: Muscle weakness (generalized) - Plan: PT plan of care cert/re-cert  Chronic right-sided low back pain with left-sided sciatica - Plan: PT plan of care cert/re-cert     Problem List Patient Active Problem List   Diagnosis Date Noted   Anal cancer ([place]) 06/13/2019   Hematuria, gross 03/11/2017   Status post total shoulder arthroplasty, right 09/12/2016   Arthritis of knee 02/27/2015   Primary osteoarthritis of right knee Valgus 02/24/2015   Neuropathy 09/07/2013   Breast cancer of upper-outer quadrant of left female breast ([place]) 08/09/2013   Breast cancer of upper-outer quadrant of right female breast ([place]) 08/09/2013   Osteoarthritis of right hip 05/03/2013   Osteoarthritis of left hip 02/15/2013   Ruben Im, PT 03/20/21 8:29 PM Phone: [phone] Fax: [phone]   Alvera Singh 03/20/2021, 8:28 PM  [facility] [address], [place], [place], [address] Phone: [phone]   Fax:  [phone]  Name: Cynthia Velasquez MRN: [id number] Date of Birth: Oct 02, 1950

## 2021-03-23 NOTE — Telephone Encounter (Signed)
Status check for "Return to work Accommodation" request this nurse noted 03/20/2021 upon return to office.  Awaiting delivery to [facility] to collaborative or registration.   "I have Strep Throat and unable to deliver form.  Will ask daughter or someone to bring sometime next week because Dr. Ernestina Penna initial accommodation ended 03/16/2021.  Employer is asking how much more time is needed and if and when intermittent time is needed but I do not know.  A new physical therapy referral to begin soon.  Continuing to see urologist.  Bladder and bowel incontinence problems continue.  Unable to remain seated more than four to five hours without having problems.  Trying to schedule appointments to not interfere with current 9:00 am - 1:00 pm part-time work schedule does not always work out."     This nurse informed Matrice Herro form may be e-mailed or faxed yet report "I do not have access to either.  Will complete employee sections for someone to bring to office."  Currently no further questions or needs.

## 2021-03-27 ENCOUNTER — Ambulatory Visit: Payer: PPO | Admitting: Physical Therapy

## 2021-03-27 ENCOUNTER — Other Ambulatory Visit: Payer: Self-pay

## 2021-03-27 DIAGNOSIS — G8929 Other chronic pain: Secondary | ICD-10-CM

## 2021-03-27 DIAGNOSIS — R279 Unspecified lack of coordination: Secondary | ICD-10-CM

## 2021-03-27 DIAGNOSIS — M6281 Muscle weakness (generalized): Secondary | ICD-10-CM

## 2021-03-27 NOTE — Patient Instructions (Signed)
Access Code: [email or web address] URL: [email or web address] Date: 03/27/2021 Prepared by: Ruben Im  Exercises Seated Abdominal Press into [employer] - 1 x daily - 7 x weekly - 1 sets - 10 reps Seated Diagonal Chop with Medicine Ball - 1 x daily - 7 x weekly - 2 sets - 10 reps Seated Shoulder Row with Anchored Resistance - 1 x daily - 7 x weekly - 1 sets - 10 reps Standing Row with Anchored Resistance - 1 x daily - 7 x weekly - 1 sets - 10 reps Standing Shoulder Extension with Resistance - 1 x daily - 7 x weekly - 1 sets - 10 reps Wall Push Up - 1 x daily - 7 x weekly - 1 sets - 10 reps

## 2021-03-27 NOTE — Therapy (Signed)
[facility] [address], [place], [place], [address] Phone: [phone]   Fax:  [phone]  Physical Therapy Treatment  Patient Details  Name: Cynthia Velasquez MRN: [id number] Date of Birth: [birth year] Referring Provider (PT): Eilene Ghazi NP   Encounter Date: 03/27/2021   PT End of Session - 03/27/21 1726     Visit Number 2    Number of Visits 10    Date for PT Re-Evaluation 05/15/21    Authorization Type Medicare 10th visit prog note    PT Start Time 1453   late   PT Stop Time 1522   lower activity tolerance   PT Time Calculation (min) 29 min    Activity Tolerance Patient tolerated treatment well             Past Medical History:  Diagnosis Date   Anal cancer ([place]) dx'd 05/2019   Anxiety    Arthritis    Bilateral breast cancer ([place]) 10/21/2013   Breast cancer ([place])    Cancer ([place])    Depression    Gallstones    GERD (gastroesophageal reflux disease)    doesn't take any meds for this   Headache    h/o migraines       History of bladder infections    History of hiatal hernia    History of migraine    Hyperlipidemia    takes Simvastatin daily   Hypertension    takes Hyzaar   Joint pain    Joint swelling    Lumbar stenosis    Neuropathy 09/07/2013   Pneumonia    Pre-diabetes     Past Surgical History:  Procedure Laterality Date   ABDOMINAL HYSTERECTOMY     bone spur removed from left foot     CHOLECYSTECTOMY     COLONOSCOPY     double mastectomy      HERNIA REPAIR     umbilical   right knee arthroscopy     right shoulder arthroscopy     surgery for hiatal hernia     TONSILLECTOMY     TOTAL HIP ARTHROPLASTY Left 02/12/2013   TOTAL HIP ARTHROPLASTY Left 02/12/2013   Procedure: LEFT TOTAL HIP ARTHROPLASTY;  Surgeon: Kerin Salen, MD;  Location: [place];  Service: Orthopedics;  Laterality: Left;   TOTAL HIP ARTHROPLASTY Right 05/03/2013   Procedure: TOTAL HIP ARTHROPLASTY;  Surgeon: Kerin Salen, MD;  Location: [place];  Service: Orthopedics;  Laterality: Right;   TOTAL KNEE ARTHROPLASTY Right 02/27/2015   Procedure: TOTAL KNEE ARTHROPLASTY;  Surgeon: Frederik Pear, MD;  Location: [place];  Service: Orthopedics;  Laterality: Right;   TOTAL SHOULDER ARTHROPLASTY Right 09/12/2016   Procedure: RIGHT TOTAL SHOULDER ARTHROPLASTY;  Surgeon: Tania Ade, MD;  Location: [place];  Service: Orthopedics;  Laterality: Right;  RIGHT TOTAL SHOULDER ARTHROPLASTY    There were no vitals filed for this visit.   Subjective Assessment - 03/27/21 1455     Subjective Rushing to get here.  Had a gender reveal for my daughter.    Pertinent History using SPC when in and  out of the house;  left THR    Limitations Sitting;Walking;Standing;House hold activities    How long can you sit comfortably? if I don't move I'm fine but upon standing I know I've overdone it;  I'm bent over.    Patient Stated Goals Not to be in pain; not to rely on cane; If I wasn't hurting I would  get in the  pool;  get back to quilting; walk more; cooking    Currently in Pain? Yes    Pain Score 6     Pain Location Back    Pain Orientation Right;Left    Pain Type Chronic pain                               [facility] Adult PT Treatment/Exercise - 03/27/21 0001       Lumbar Exercises: Aerobic   Nustep 6:41 1 lap  seat 9      Lumbar Exercises: Standing   Row Both;Strengthening;10 reps;Theraband    Theraband Level (Row) Level 2 (Red)    Shoulder Extension Both;10 reps    Theraband Level (Shoulder Extension) Level 2 (Red)    Other Standing Lumbar Exercises wall push ups 10x      Lumbar Exercises: Seated   Other Seated Lumbar Exercises foam roll push down 10x    Other Seated Lumbar Exercises yellow plyo ball chops 10x each way                    PT Education - 03/27/21 1515     Education Details seated ab isometric; seated chops, red band rows seated and standing; red band shoulder extensions;  wall push ups    Person(s) Educated Patient    Methods Explanation;Demonstration;Handout    Comprehension Verbalized understanding;Returned demonstration              PT Short Term Goals - 03/20/21 2021       PT SHORT TERM GOAL #1   Title ind with initial HEP    Time 4    Period Weeks    Status New    Target Date 04/17/21      PT SHORT TERM GOAL #2   Title pt will report 30% improvement in LBP with rising, standing and walking    Time 4    Period Weeks    Status New               PT Long Term Goals - 03/20/21 2022       PT LONG TERM GOAL #1   Title The patient will be independent in safe self progression of HEP and/or aquatic ex program    Time 8    Period Weeks    Status New    Target Date 05/15/21      PT LONG TERM GOAL #2   Title The patient will have lumbo/pelvic/hip strength grossly 4-/5 needed to ambulate household distances without her cane    Time 8    Period Weeks    Status New      PT LONG TERM GOAL #3   Title The patient will report a 60% improvement in pain and function for driving, standing to cook and walking    Time 8    Period Weeks    Status New      PT LONG TERM GOAL #4   Title FOTO functional outcome score improved from 38% to 53%    Time 8    Period Weeks    Status New      PT LONG TERM GOAL #5   Title Pt will be able to exercise consistently for at least 20 minutes 2-3  days/week due to improved function and energy levels    Time 8    Period Weeks    Status New  Plan - 03/27/21 1727     Clinical Impression Statement Low level ex's for core muscle activation initiated in sitting and standing (patient's positional preference- avoid supine).  Repetitions and intensity at a level to intentionally underdose for better success and compliance.  Verbal cues to activate transverse abdominus muscles and to monitor response throughout session.    Personal Factors and Comorbidities Comorbidity 1;Comorbidity  2;Comorbidity 3+;Time since onset of injury/illness/exacerbation    Comorbidities chronic history of LBP of 2 years; history of colorectal cancer and breast cancer; hip THR    Examination-Participation Restrictions Meal Prep;Cleaning;Community Activity;Shop;Driving    Rehab Potential Good    PT Frequency 2x / week    PT Duration 8 weeks    PT Treatment/Interventions ADLs/Self Care Home Management;Aquatic Therapy;Electrical Stimulation;Moist Heat;Traction;Functional mobility training;Therapeutic activities;Therapeutic exercise;Neuromuscular re-education;Manual techniques;Patient/family education;Dry needling;Taping    PT Next Visit Plan assess reponse to initial HEP in sitting and standing; Nu-step; low level intensity    PT Home Exercise Plan [email or web address]             Patient will benefit from skilled therapeutic intervention in order to improve the following deficits and impairments:  Decreased range of motion, Decreased activity tolerance, Pain, Decreased strength, Decreased mobility  Visit Diagnosis: Muscle weakness (generalized)  Chronic right-sided low back pain with left-sided sciatica  Unspecified lack of coordination     Problem List Patient Active Problem List   Diagnosis Date Noted   Anal cancer ([place]) 06/13/2019   Hematuria, gross 03/11/2017   Status post total shoulder arthroplasty, right 09/12/2016   Arthritis of knee 02/27/2015   Primary osteoarthritis of right knee Valgus 02/24/2015   Neuropathy 09/07/2013   Breast cancer of upper-outer quadrant of left female breast ([place]) 08/09/2013   Breast cancer of upper-outer quadrant of right female breast ([place]) 08/09/2013   Osteoarthritis of right hip 05/03/2013   Osteoarthritis of left hip 02/15/2013   Ruben Im, PT 03/27/21 5:34 PM Phone: [phone] Fax: [phone]   Alvera Singh 03/27/2021, 5:33 PM  [facility] [address], [place],  [place], [address] Phone: [phone]   Fax:  [phone]  Name: Cynthia Velasquez MRN: [id number] Date of Birth: Sep 21, 1950

## 2021-03-28 ENCOUNTER — Telehealth: Payer: Self-pay

## 2021-03-28 NOTE — Telephone Encounter (Signed)
This nurse spoke with patient related to work accomodation papers.  Patient stated that she still has them and has plans to drop them off to [facility] today once she gets off work.  No further questions or concerns at this time.

## 2021-04-02 ENCOUNTER — Other Ambulatory Visit: Payer: Self-pay

## 2021-04-02 ENCOUNTER — Other Ambulatory Visit: Payer: Self-pay | Admitting: Family Medicine

## 2021-04-02 ENCOUNTER — Ambulatory Visit
Admission: RE | Admit: 2021-04-02 | Discharge: 2021-04-02 | Disposition: A | Discharge status: Home / Self Care | Payer: PPO | Source: Ambulatory Visit | Attending: Family Medicine | Admitting: Family Medicine

## 2021-04-02 DIAGNOSIS — F331 Major depressive disorder, recurrent, moderate: Secondary | ICD-10-CM | POA: Diagnosis not present

## 2021-04-02 DIAGNOSIS — I10 Essential (primary) hypertension: Secondary | ICD-10-CM | POA: Diagnosis not present

## 2021-04-02 DIAGNOSIS — E7800 Pure hypercholesterolemia, unspecified: Secondary | ICD-10-CM | POA: Diagnosis not present

## 2021-04-02 DIAGNOSIS — M25522 Pain in left elbow: Secondary | ICD-10-CM | POA: Diagnosis not present

## 2021-04-02 DIAGNOSIS — F329 Major depressive disorder, single episode, unspecified: Secondary | ICD-10-CM | POA: Diagnosis not present

## 2021-04-02 DIAGNOSIS — M19012 Primary osteoarthritis, left shoulder: Secondary | ICD-10-CM | POA: Diagnosis not present

## 2021-04-02 DIAGNOSIS — M150 Primary generalized (osteo)arthritis: Secondary | ICD-10-CM | POA: Diagnosis not present

## 2021-04-02 DIAGNOSIS — C20 Malignant neoplasm of rectum: Secondary | ICD-10-CM | POA: Diagnosis not present

## 2021-04-02 DIAGNOSIS — R52 Pain, unspecified: Secondary | ICD-10-CM

## 2021-04-02 DIAGNOSIS — W19XXXA Unspecified fall, initial encounter: Secondary | ICD-10-CM | POA: Diagnosis not present

## 2021-04-02 DIAGNOSIS — K219 Gastro-esophageal reflux disease without esophagitis: Secondary | ICD-10-CM | POA: Diagnosis not present

## 2021-04-02 DIAGNOSIS — M25512 Pain in left shoulder: Secondary | ICD-10-CM | POA: Diagnosis not present

## 2021-04-03 ENCOUNTER — Ambulatory Visit: Payer: PPO | Admitting: Physical Therapy

## 2021-04-03 DIAGNOSIS — M6281 Muscle weakness (generalized): Secondary | ICD-10-CM

## 2021-04-03 DIAGNOSIS — G8929 Other chronic pain: Secondary | ICD-10-CM

## 2021-04-03 NOTE — Therapy (Signed)
[facility] [address], [place], [place], [address] Phone: [phone]   Fax:  [phone]  Physical Therapy Treatment  Patient Details  Name: Cynthia Velasquez MRN: [id number] Date of Birth: 1951/05/12 Referring Provider (PT): Eilene Ghazi NP   Encounter Date: 04/03/2021   PT End of Session - 04/03/21 1439     Visit Number 3    Number of Visits 10    Date for PT Re-Evaluation 05/15/21    Authorization Type Medicare 10th visit prog note    PT Start Time 1400    PT Stop Time 1440    PT Time Calculation (min) 40 min    Activity Tolerance Patient tolerated treatment well             Past Medical History:  Diagnosis Date   Anal cancer ([place]) dx'd 05/2019   Anxiety    Arthritis    Bilateral breast cancer ([place]) 10/21/2013   Breast cancer ([place])    Cancer ([place])    Depression    Gallstones    GERD (gastroesophageal reflux disease)    doesn't take any meds for this   Headache    h/o migraines       History of bladder infections    History of hiatal hernia    History of migraine    Hyperlipidemia    takes Simvastatin daily   Hypertension    takes Hyzaar   Joint pain    Joint swelling    Lumbar stenosis    Neuropathy 09/07/2013   Pneumonia    Pre-diabetes     Past Surgical History:  Procedure Laterality Date   ABDOMINAL HYSTERECTOMY     bone spur removed from left foot     CHOLECYSTECTOMY     COLONOSCOPY     double mastectomy      HERNIA REPAIR     umbilical   right knee arthroscopy     right shoulder arthroscopy     surgery for hiatal hernia     TONSILLECTOMY     TOTAL HIP ARTHROPLASTY Left 02/12/2013   TOTAL HIP ARTHROPLASTY Left 02/12/2013   Procedure: LEFT TOTAL HIP ARTHROPLASTY;  Surgeon: Kerin Salen, MD;  Location: [place];  Service: Orthopedics;  Laterality: Left;   TOTAL HIP ARTHROPLASTY Right 05/03/2013   Procedure: TOTAL HIP ARTHROPLASTY;  Surgeon: Kerin Salen, MD;  Location: [place];   Service: Orthopedics;  Laterality: Right;   TOTAL KNEE ARTHROPLASTY Right 02/27/2015   Procedure: TOTAL KNEE ARTHROPLASTY;  Surgeon: Frederik Pear, MD;  Location: [place];  Service: Orthopedics;  Laterality: Right;   TOTAL SHOULDER ARTHROPLASTY Right 09/12/2016   Procedure: RIGHT TOTAL SHOULDER ARTHROPLASTY;  Surgeon: Tania Ade, MD;  Location: [place];  Service: Orthopedics;  Laterality: Right;  RIGHT TOTAL SHOULDER ARTHROPLASTY    There were no vitals filed for this visit.   Subjective Assessment - 04/03/21 1400     Subjective I fell yesterday at home.  I tripped over a computer cord.  Hurt my left side.  I screwed up my back, left shoulder and elbow.  Saw the doctor and had x-rays.  No fracture.   I did clean out my pantry this morning.    Pertinent History using SPC when in and  out of the house;  left THR    Limitations Sitting;Walking;Standing;House hold activities    How long can you sit comfortably? if I don't move I'm fine but upon standing I know I've overdone it;  I'm bent over.    Patient Stated Goals Not to be in pain; not to rely on cane; If I wasn't hurting I would  get in the pool;  get back to quilting; walk more; cooking    Currently in Pain? Yes    Pain Score 10-Worst pain ever    Pain Location Back    Pain Orientation Left    Pain Type Chronic pain                               [facility] Adult PT Treatment/Exercise - 04/03/21 0001       Lumbar Exercises: Stretches   Other Lumbar Stretch Exercise seated green ball roll outs 2 sets of 15    Other Lumbar Stretch Exercise attempted heel to shin slides but too painful      Lumbar Exercises: Aerobic   Nustep Seat 10 9  min  LEs and mostly right arm:      Lumbar Exercises: Seated   Other Seated Lumbar Exercises cane push down 8x to activate transverse abdominus muscles; ball squeezes 10x    Other Seated Lumbar Exercises yellow band [place] seated isometric holds 10 sec 3x right/left   small lift off  secondary to LBP     Knee/Hip Exercises: Seated   Ball Squeeze 20x    Clamshell with TheraBand Yellow   loop 12x                     PT Short Term Goals - 03/20/21 2021       PT SHORT TERM GOAL #1   Title ind with initial HEP    Time 4    Period Weeks    Status New    Target Date 04/17/21      PT SHORT TERM GOAL #2   Title pt will report 30% improvement in LBP with rising, standing and walking    Time 4    Period Weeks    Status New               PT Long Term Goals - 03/20/21 2022       PT LONG TERM GOAL #1   Title The patient will be independent in safe self progression of HEP and/or aquatic ex program    Time 8    Period Weeks    Status New    Target Date 05/15/21      PT LONG TERM GOAL #2   Title The patient will have lumbo/pelvic/hip strength grossly 4-/5 needed to ambulate household distances without her cane    Time 8    Period Weeks    Status New      PT LONG TERM GOAL #3   Title The patient will report a 60% improvement in pain and function for driving, standing to cook and walking    Time 8    Period Weeks    Status New      PT LONG TERM GOAL #4   Title FOTO functional outcome score improved from 38% to 53%    Time 8    Period Weeks    Status New      PT LONG TERM GOAL #5   Title Pt will be able to exercise consistently for at least 20 minutes 2-3  days/week due to improved function and energy levels    Time 8    Period Weeks    Status New  Plan - 04/03/21 1439     Clinical Impression Statement Treatment modified secondary to increased LBP following a fall yesterday.  Low intensity exercises performed in sitting, her most comfortable position today.  Ex's performed with low resistance and minimal repetitions.  Seated ball roll outs bring temporary pain relief.  Therapist continually modifying ex's based on response.    Personal Factors and Comorbidities Comorbidity 1;Comorbidity 2;Comorbidity 3+;Time  since onset of injury/illness/exacerbation    Comorbidities chronic history of LBP of 2 years; history of colorectal cancer and breast cancer; hip THR    Examination-Participation Restrictions Meal Prep;Cleaning;Community Activity;Shop;Driving    Stability/Clinical Decision Making Evolving/Moderate complexity    Rehab Potential Good    PT Frequency 2x / week    PT Duration 8 weeks    PT Treatment/Interventions ADLs/Self Care Home Management;Aquatic Therapy;Electrical Stimulation;Moist Heat;Traction;Functional mobility training;Therapeutic activities;Therapeutic exercise;Neuromuscular re-education;Manual techniques;Patient/family education;Dry needling;Taping    PT Next Visit Plan sitting and standing; Nu-step; low level intensity; aquatic ex    PT Home Exercise Plan [id number]             Patient will benefit from skilled therapeutic intervention in order to improve the following deficits and impairments:  Decreased range of motion, Decreased activity tolerance, Pain, Decreased strength, Decreased mobility  Visit Diagnosis: Muscle weakness (generalized)  Chronic right-sided low back pain with left-sided sciatica     Problem List Patient Active Problem List   Diagnosis Date Noted   Anal cancer ([place]) 06/13/2019   Hematuria, gross 03/11/2017   Status post total shoulder arthroplasty, right 09/12/2016   Arthritis of knee 02/27/2015   Primary osteoarthritis of right knee Valgus 02/24/2015   Neuropathy 09/07/2013   Breast cancer of upper-outer quadrant of left female breast ([place]) 08/09/2013   Breast cancer of upper-outer quadrant of right female breast ([place]) 08/09/2013   Osteoarthritis of right hip 05/03/2013   Osteoarthritis of left hip 02/15/2013   Ruben Im, PT 04/03/21 4:39 PM Phone: [phone] Fax: [phone]  Alvera Singh 04/03/2021, 4:39 PM  [facility] [address] [place], [place],  [address] Phone: [phone]   Fax:  [phone]  Name: Cynthia Velasquez MRN: [id number] Date of Birth: [birth year]

## 2021-04-04 ENCOUNTER — Other Ambulatory Visit: Payer: Self-pay

## 2021-04-04 ENCOUNTER — Emergency Department (HOSPITAL_BASED_OUTPATIENT_CLINIC_OR_DEPARTMENT_OTHER)
Admission: EM | Admit: 2021-04-04 | Discharge: 2021-04-04 | Disposition: A | Discharge status: Home / Self Care | Payer: PPO | Attending: Emergency Medicine | Admitting: Emergency Medicine

## 2021-04-04 ENCOUNTER — Emergency Department (HOSPITAL_BASED_OUTPATIENT_CLINIC_OR_DEPARTMENT_OTHER): Payer: PPO

## 2021-04-04 ENCOUNTER — Encounter (HOSPITAL_BASED_OUTPATIENT_CLINIC_OR_DEPARTMENT_OTHER): Payer: Self-pay

## 2021-04-04 DIAGNOSIS — M79604 Pain in right leg: Secondary | ICD-10-CM | POA: Diagnosis not present

## 2021-04-04 DIAGNOSIS — I10 Essential (primary) hypertension: Secondary | ICD-10-CM | POA: Diagnosis not present

## 2021-04-04 DIAGNOSIS — Z85048 Personal history of other malignant neoplasm of rectum, rectosigmoid junction, and anus: Secondary | ICD-10-CM | POA: Diagnosis not present

## 2021-04-04 DIAGNOSIS — M79651 Pain in right thigh: Secondary | ICD-10-CM | POA: Diagnosis not present

## 2021-04-04 DIAGNOSIS — Z96643 Presence of artificial hip joint, bilateral: Secondary | ICD-10-CM | POA: Insufficient documentation

## 2021-04-04 DIAGNOSIS — Z96651 Presence of right artificial knee joint: Secondary | ICD-10-CM | POA: Diagnosis not present

## 2021-04-04 DIAGNOSIS — M25551 Pain in right hip: Secondary | ICD-10-CM | POA: Insufficient documentation

## 2021-04-04 DIAGNOSIS — Z79899 Other long term (current) drug therapy: Secondary | ICD-10-CM | POA: Diagnosis not present

## 2021-04-04 DIAGNOSIS — S0990XA Unspecified injury of head, initial encounter: Secondary | ICD-10-CM | POA: Diagnosis present

## 2021-04-04 DIAGNOSIS — Z853 Personal history of malignant neoplasm of breast: Secondary | ICD-10-CM | POA: Insufficient documentation

## 2021-04-04 DIAGNOSIS — Z96611 Presence of right artificial shoulder joint: Secondary | ICD-10-CM | POA: Insufficient documentation

## 2021-04-04 DIAGNOSIS — R519 Headache, unspecified: Secondary | ICD-10-CM | POA: Diagnosis not present

## 2021-04-04 DIAGNOSIS — W01198A Fall on same level from slipping, tripping and stumbling with subsequent striking against other object, initial encounter: Secondary | ICD-10-CM | POA: Diagnosis not present

## 2021-04-04 DIAGNOSIS — M25561 Pain in right knee: Secondary | ICD-10-CM | POA: Insufficient documentation

## 2021-04-04 DIAGNOSIS — R102 Pelvic and perineal pain: Secondary | ICD-10-CM | POA: Diagnosis not present

## 2021-04-04 DIAGNOSIS — S0003XA Contusion of scalp, initial encounter: Secondary | ICD-10-CM | POA: Diagnosis not present

## 2021-04-04 DIAGNOSIS — R296 Repeated falls: Secondary | ICD-10-CM | POA: Diagnosis not present

## 2021-04-04 DIAGNOSIS — R52 Pain, unspecified: Secondary | ICD-10-CM

## 2021-04-04 DIAGNOSIS — W19XXXA Unspecified fall, initial encounter: Secondary | ICD-10-CM

## 2021-04-04 MED ORDER — ACETAMINOPHEN 500 MG PO TABS
1000.0000 mg | ORAL_TABLET | Freq: Once | ORAL | Status: AC
  Administered 2021-04-04: 1000 mg via ORAL
  Filled 2021-04-04: qty 2

## 2021-04-04 NOTE — ED Provider Notes (Signed)
[facility] EMERGENCY DEPARTMENT Provider Note   CSN: [id number] Arrival date & time: 04/04/21  2028     History Chief Complaint  Patient presents with   [name]    Cynthia Velasquez is a 70 y.o. female.  The history is provided by the patient.  Fall Cynthia Velasquez is a 70 y.o. female who presents to the Emergency Department complaining of multiple falls.  She presents to the ED for evaluation following multiple falls at the encouragement of her family.  The first fall occurred two days ago when she tripped over a cord and struck her head.  Today she tripped over a box today and struck her head on an entertainment center.  No loss of consciousness. She states these falls have occurred in the context of the house being rearranged due to expecting a new child. She denies any recent illnesses. No fevers, chest pain, nausea, vomiting, dysuria. She did strike her head today as well and she complains of pain from her right hip to her knee. She is able to bear weight on this leg but it is sore when she ambulates. She does have a history of breast cancer times two as well as annual cancer all are currently in remission. No recent medication changes. She is not on anticoagulants. She believes that the falls work due to her sandals catching a cord on one occasion and the edge of a box on a second occasion. She has discarded the sandals.       Past Medical History:  Diagnosis Date   Anal cancer ([place]) dx'd 05/2019   Anxiety    Arthritis    Bilateral breast cancer ([place]) 10/21/2013   Breast cancer ([place])    Cancer ([place])    Depression    Gallstones    GERD (gastroesophageal reflux disease)    doesn't take any meds for this   Headache    h/o migraines       History of bladder infections    History of hiatal hernia    History of migraine    Hyperlipidemia    takes Simvastatin daily   Hypertension    takes Hyzaar   Joint pain    Joint swelling    Lumbar stenosis    Neuropathy 09/07/2013    Pneumonia    Pre-diabetes     Patient Active Problem List   Diagnosis Date Noted   Anal cancer ([place]) 06/13/2019   Hematuria, gross 03/11/2017   Status post total shoulder arthroplasty, right 09/12/2016   Arthritis of knee 02/27/2015   Primary osteoarthritis of right knee Valgus 02/24/2015   Neuropathy 09/07/2013   Breast cancer of upper-outer quadrant of left female breast ([place]) 08/09/2013   Breast cancer of upper-outer quadrant of right female breast ([place]) 08/09/2013   Osteoarthritis of right hip 05/03/2013   Osteoarthritis of left hip 02/15/2013    Past Surgical History:  Procedure Laterality Date   ABDOMINAL HYSTERECTOMY     bone spur removed from left foot     CHOLECYSTECTOMY     COLONOSCOPY     double mastectomy      HERNIA REPAIR     umbilical   right knee arthroscopy     right shoulder arthroscopy     surgery for hiatal hernia     TONSILLECTOMY     TOTAL HIP ARTHROPLASTY Left 02/12/2013   TOTAL HIP ARTHROPLASTY Left 02/12/2013   Procedure: LEFT TOTAL HIP ARTHROPLASTY;  Surgeon: Kerin Salen, MD;  Location: [facility];  Service: Orthopedics;  Laterality: Left;   TOTAL HIP ARTHROPLASTY Right 05/03/2013   Procedure: TOTAL HIP ARTHROPLASTY;  Surgeon: Kerin Salen, MD;  Location: [facility];  Service: Orthopedics;  Laterality: Right;   TOTAL KNEE ARTHROPLASTY Right 02/27/2015   Procedure: TOTAL KNEE ARTHROPLASTY;  Surgeon: Frederik Pear, MD;  Location: [facility];  Service: Orthopedics;  Laterality: Right;   TOTAL SHOULDER ARTHROPLASTY Right 09/12/2016   Procedure: RIGHT TOTAL SHOULDER ARTHROPLASTY;  Surgeon: Tania Ade, MD;  Location: [facility];  Service: Orthopedics;  Laterality: Right;  RIGHT TOTAL SHOULDER ARTHROPLASTY     OB History   No obstetric history on file.     Family History  Adopted: Yes  Problem Relation Age of Onset   Allergic rhinitis Neg Hx    Angioedema Neg Hx    Atopy Neg Hx    Eczema Neg Hx    Immunodeficiency Neg Hx    Urticaria Neg Hx    Asthma Neg Hx      Social History   Tobacco Use   Smoking status: Never   Smokeless tobacco: Never  Vaping Use   Vaping Use: Never used  Substance Use Topics   Alcohol use: No   Drug use: No    Home Medications Prior to Admission medications   Medication Sig Start Date End Date Taking? Authorizing Provider  LINZESS 72 MCG capsule  06/04/19   [provider]  losartan (COZAAR) 100 MG tablet TK 1 T PO D 01/14/17   [provider]  ondansetron (ZOFRAN ODT) 4 MG disintegrating tablet Take 1 tablet (4 mg total) by mouth every 8 (eight) hours as needed for nausea or vomiting. Patient not taking: Reported on 03/20/2021 02/27/18   Doristine Devoid, PA-C  PROAIR HFA 108 [phone] Base) MCG/ACT inhaler 2 inhalations every 4-6 hours as needed. Patient taking differently: Inhale 2 puffs into the lungs every 6 (six) hours as needed for wheezing or shortness of breath. 02/05/17   Kozlow, Donnamarie Poag, MD  rosuvastatin (CRESTOR) 40 MG tablet Take 40 mg by mouth daily. 07/30/19   [provider]    Allergies    Oxycodone-acetaminophen, Codeine, and Vicodin [hydrocodone-acetaminophen]  Review of Systems   Review of Systems  All other systems reviewed and are negative.  Physical Exam Updated Vital Signs BP (!) 154/106 (BP Location: Left Arm)   Pulse 86   Temp 98.4 F (36.9 C)   Resp 18   Ht 5\' 5"  (1.651 m)   Wt 101.2 kg   SpO2 96%   BMI 37.11 kg/m   Physical Exam Vitals and nursing note reviewed.  Constitutional:      Appearance: She is well-developed.  HENT:     Head: Normocephalic and atraumatic.  Cardiovascular:     Rate and Rhythm: Normal rate and regular rhythm.     Heart sounds: No murmur heard. Pulmonary:     Effort: Pulmonary effort is normal. No respiratory distress.     Breath sounds: Normal breath sounds.  Abdominal:     Palpations: Abdomen is soft.     Tenderness: There is no abdominal tenderness. There is no guarding or rebound.  Musculoskeletal:        General:  No swelling or tenderness.     Comments: There is mild soft tissue swelling to the right lateral hip. Range of motion is intact at the hip, knee, ankle. Mildly antalgic gait.  Skin:    General: Skin is warm and dry.  Neurological:     Mental  Status: She is alert and oriented to person, place, and time.  Psychiatric:        Behavior: Behavior normal.    ED Results / Procedures / Treatments   Labs (all labs ordered are listed, but only abnormal results are displayed) Labs Reviewed - No data to display  EKG None  Radiology DG Pelvis 1-2 Views  Result Date: 04/04/2021 CLINICAL DATA:  Two falls in the past 2 days.  Pain. EXAM: PELVIS - 1-2 VIEW COMPARISON:  None. FINDINGS: Bilateral hip arthroplasties in expected alignment. No periprosthetic lucency or acute fracture. The distal femoral stem on the right is included on the concurrent femur exam, the distal femoral stem on the left is not included in the field of view. Remote fracture or heterotopic calcification adjacent to the left greater trochanter. No pelvic fracture. Pubic rami are intact. Pubic symphysis and sacroiliac joints are congruent with mild degenerative change. IMPRESSION: 1. No acute pelvic fracture. 2. Bilateral hip arthroplasties.  No complication where visualized. Electronically Signed   By: Keith Rake M.D.   On: 04/04/2021 22:28   CT Head Wo Contrast  Result Date: 04/04/2021 CLINICAL DATA:  Tripped and fell, hit head, headache EXAM: CT HEAD WITHOUT CONTRAST TECHNIQUE: Contiguous axial images were obtained from the base of the skull through the vertex without intravenous contrast. COMPARISON:  05/22/2018 FINDINGS: Brain: No acute infarct or hemorrhage. Lateral ventricles and midline structures are unremarkable. No acute extra-axial fluid collections. No mass effect. Vascular: No hyperdense vessel or unexpected calcification. Skull: Normal. Negative for fracture or focal lesion. Sinuses/Orbits: No acute finding. Other:  None. IMPRESSION: 1. No acute intracranial process. Electronically Signed   By: Randa Ngo M.D.   On: 04/04/2021 22:29   DG Femur Min 2 Views Right  Result Date: 04/04/2021 CLINICAL DATA:  Two falls in the last 2 days.  Pain. EXAM: RIGHT FEMUR 2 VIEWS COMPARISON:  None. FINDINGS: Right hip arthroplasty in expected alignment. No periprosthetic lucency or fracture. The femoral stem is midline. Right knee arthroplasty in expected alignment. No periprosthetic lucency or fracture. No femoral shaft fracture. No soft tissue abnormality. IMPRESSION: 1. No acute fracture. 2. Intact right hip and knee arthroplasties. Electronically Signed   By: Keith Rake M.D.   On: 04/04/2021 22:27    Procedures Procedures   Medications Ordered in ED Medications  acetaminophen (TYLENOL) tablet 1,000 mg (1,000 mg Oral Given 04/04/21 2235)    ED Course  I have reviewed the triage vital signs and the nursing notes.  Pertinent labs & imaging results that were available during my care of the patient were reviewed by me and considered in my medical decision making (see chart for details).    MDM Rules/Calculators/A&P                          patient here for evaluation of false times two over the last several days. She does have mild tenderness on examination and antalgic gait. Imaging is negative for acute fracture or serious intracranial abnormality. No reports of recent illnesses or clear triggers for these falls. Discussed with patient home care for contusion following falls with outpatient follow-up and return precautions.  Final Clinical Impression(s) / ED Diagnoses Final diagnoses:  Fall, initial encounter  Contusion of scalp, initial encounter  Leg pain, lateral, right    Rx / DC Orders ED Discharge Orders     None        Quintella Reichert, MD 04/04/21 2313

## 2021-04-04 NOTE — ED Notes (Signed)
Patient transported to X-ray and CT 

## 2021-04-04 NOTE — ED Triage Notes (Addendum)
Pt states she tripped over a cord 2 days ago and tripped over a box today ~38min PTA-pain to right LE "from my knee to the hip", pain to left wrist-states she hit her head on an entertainment unit with today's fall-denies break in the skin-denies LOC-pain "to my temples"-NAD-steady gait-states she feels both falls were r/t the sandals she was wearing

## 2021-04-04 NOTE — ED Notes (Signed)
Pt provided discharge instructions and prescription information. Pt was given the opportunity to ask questions and questions were answered. Discharge signature not obtained in the setting of the COVID-19 pandemic in order to reduce high touch surfaces.  ° °

## 2021-04-10 ENCOUNTER — Telehealth: Payer: Self-pay | Admitting: Physical Therapy

## 2021-04-10 ENCOUNTER — Ambulatory Visit: Payer: PPO | Admitting: Physical Therapy

## 2021-04-10 NOTE — Telephone Encounter (Signed)
Left message regarding no show for today's appt.  Reminded of next appt on 8/2

## 2021-04-17 ENCOUNTER — Ambulatory Visit: Payer: PPO | Attending: Home Modifications | Admitting: Physical Therapy

## 2021-04-17 ENCOUNTER — Other Ambulatory Visit: Payer: Self-pay

## 2021-04-17 DIAGNOSIS — M6281 Muscle weakness (generalized): Secondary | ICD-10-CM

## 2021-04-17 DIAGNOSIS — G8929 Other chronic pain: Secondary | ICD-10-CM | POA: Diagnosis not present

## 2021-04-17 DIAGNOSIS — M5442 Lumbago with sciatica, left side: Secondary | ICD-10-CM | POA: Insufficient documentation

## 2021-04-17 DIAGNOSIS — R279 Unspecified lack of coordination: Secondary | ICD-10-CM

## 2021-04-17 NOTE — Therapy (Signed)
[facility] [address], [place], [place], [address] Phone: [phone]   Fax:  [phone]  Physical Therapy Treatment  Patient Details  Name: Cynthia Velasquez MRN: [id number] Date of Birth: [birth year] Referring Provider (PT): Eilene Ghazi NP   Encounter Date: 04/17/2021   PT End of Session - 04/17/21 1521     Visit Number 4    Number of Visits 10    Date for PT Re-Evaluation 05/15/21    Authorization Type Medicare 10th visit prog note    PT Start Time 1457   late   PT Stop Time 1525    PT Time Calculation (min) 28 min    Activity Tolerance Patient tolerated treatment well             Past Medical History:  Diagnosis Date   Anal cancer ([place]) dx'd 05/2019   Anxiety    Arthritis    Bilateral breast cancer ([place]) 10/21/2013   Breast cancer ([place])    Cancer ([place])    Depression    Gallstones    GERD (gastroesophageal reflux disease)    doesn't take any meds for this   Headache    h/o migraines       History of bladder infections    History of hiatal hernia    History of migraine    Hyperlipidemia    takes Simvastatin daily   Hypertension    takes Hyzaar   Joint pain    Joint swelling    Lumbar stenosis    Neuropathy 09/07/2013   Pneumonia    Pre-diabetes     Past Surgical History:  Procedure Laterality Date   ABDOMINAL HYSTERECTOMY     bone spur removed from left foot     CHOLECYSTECTOMY     COLONOSCOPY     double mastectomy      HERNIA REPAIR     umbilical   right knee arthroscopy     right shoulder arthroscopy     surgery for hiatal hernia     TONSILLECTOMY     TOTAL HIP ARTHROPLASTY Left 02/12/2013   TOTAL HIP ARTHROPLASTY Left 02/12/2013   Procedure: LEFT TOTAL HIP ARTHROPLASTY;  Surgeon: Kerin Salen, MD;  Location: [place];  Service: Orthopedics;  Laterality: Left;   TOTAL HIP ARTHROPLASTY Right 05/03/2013   Procedure: TOTAL HIP ARTHROPLASTY;  Surgeon: Kerin Salen, MD;  Location: [place];  Service: Orthopedics;  Laterality: Right;   TOTAL KNEE ARTHROPLASTY Right 02/27/2015   Procedure: TOTAL KNEE ARTHROPLASTY;  Surgeon: Frederik Pear, MD;  Location: [place];  Service: Orthopedics;  Laterality: Right;   TOTAL SHOULDER ARTHROPLASTY Right 09/12/2016   Procedure: RIGHT TOTAL SHOULDER ARTHROPLASTY;  Surgeon: Tania Ade, MD;  Location: [place];  Service: Orthopedics;  Laterality: Right;  RIGHT TOTAL SHOULDER ARTHROPLASTY    There were no vitals filed for this visit.   Subjective Assessment - 04/17/21 1456     Subjective Severe pain last week.  I took a pill and missed my appointment.  Today I have a migraine and I'm nauseas.  My brusing is going away but there are knots along my right lateral thigh.    Pertinent History using SPC when in and  out of the house;  left THR    How long can you sit comfortably? if I don't move I'm fine but upon standing I know I've overdone it;  I'm bent over.    Patient Stated Goals Not to be in pain; not  to rely on cane; If I wasn't hurting I would  get in the pool;  get back to quilting; walk more; cooking    Currently in Pain? Yes    Pain Score 6     Pain Location Back                               [facility] Adult PT Treatment/Exercise - 04/17/21 0001       Therapeutic Activites    Other Therapeutic Activities 2 2# weight slides down thighs to knees 15x; 2# snatch and press 15x right/left      Lumbar Exercises: Aerobic   Nustep seat 9 7 min whilediscussing status      Lumbar Exercises: Standing   Other Standing Lumbar Exercises counter push ups 15x    Other Standing Lumbar Exercises hip abduction left painful 3x; right 10x      Lumbar Exercises: Seated   Sit to Stand 5 reps    Sit to Stand Limitations mat table plus cushion    Other Seated Lumbar Exercises foam roll push down 10x    Other Seated Lumbar Exercises red band pallof ress                      PT Short Term Goals - 03/20/21 2021       PT  SHORT TERM GOAL #1   Title ind with initial HEP    Time 4    Period Weeks    Status New    Target Date 04/17/21      PT SHORT TERM GOAL #2   Title pt will report 30% improvement in LBP with rising, standing and walking    Time 4    Period Weeks    Status New               PT Long Term Goals - 03/20/21 2022       PT LONG TERM GOAL #1   Title The patient will be independent in safe self progression of HEP and/or aquatic ex program    Time 8    Period Weeks    Status New    Target Date 05/15/21      PT LONG TERM GOAL #2   Title The patient will have lumbo/pelvic/hip strength grossly 4-/5 needed to ambulate household distances without her cane    Time 8    Period Weeks    Status New      PT LONG TERM GOAL #3   Title The patient will report a 60% improvement in pain and function for driving, standing to cook and walking    Time 8    Period Weeks    Status New      PT LONG TERM GOAL #4   Title FOTO functional outcome score improved from 38% to 53%    Time 8    Period Weeks    Status New      PT LONG TERM GOAL #5   Title Pt will be able to exercise consistently for at least 20 minutes 2-3  days/week due to improved function and energy levels    Time 8    Period Weeks    Status New                   Plan - 04/17/21 1618     Clinical Impression Statement Despite reports of a migraine, the patient is  able to participate in lower intensity and low volume exercises without worsening of migraine or low back pain.  Able to include strengthening in standing with sit to stand, [place] dead lifting and snatch and presses with light weights.  Therapist closely monitoring response throughout treatment session.   Next visit will be for aquatic PT.  She is limited in attending aquatic PT on Wed and Fridays due to her work schedule but she hopes to attend this week to establish a basic plan she can do on her own.    Personal Factors and Comorbidities Comorbidity  1;Comorbidity 2;Comorbidity 3+;Time since onset of injury/illness/exacerbation    Comorbidities chronic history of LBP of 2 years; history of colorectal cancer and breast cancer; hip THR    Rehab Potential Good    PT Frequency 2x / week    PT Duration 8 weeks    PT Treatment/Interventions ADLs/Self Care Home Management;Aquatic Therapy;Electrical Stimulation;Moist Heat;Traction;Functional mobility training;Therapeutic activities;Therapeutic exercise;Neuromuscular re-education;Manual techniques;Patient/family education;Dry needling;Taping    PT Next Visit Plan aquatic PT may only be able to attend 1x in pool due to her work schedule but hopes to establish a basic routine she can do on her own    PT [place]             Patient will benefit from skilled therapeutic intervention in order to improve the following deficits and impairments:  Decreased range of motion, Decreased activity tolerance, Pain, Decreased strength, Decreased mobility  Visit Diagnosis: Muscle weakness (generalized)  Chronic right-sided low back pain with left-sided sciatica  Unspecified lack of coordination     Problem List Patient Active Problem List   Diagnosis Date Noted   Anal cancer ([place]) 06/13/2019   Hematuria, gross 03/11/2017   Status post total shoulder arthroplasty, right 09/12/2016   Arthritis of knee 02/27/2015   Primary osteoarthritis of right knee Valgus 02/24/2015   Neuropathy 09/07/2013   Breast cancer of upper-outer quadrant of left female breast ([place]) 08/09/2013   Breast cancer of upper-outer quadrant of right female breast ([place]) 08/09/2013   Osteoarthritis of right hip 05/03/2013   Osteoarthritis of left hip 02/15/2013   Ruben Im, PT 04/17/21 5:09 PM Phone: [phone] Fax: [phone]  Alvera Singh 04/17/2021, 5:09 PM  [facility] [address], [place], [place], [address] Phone: [phone]    Fax:  [phone]  Name: Cynthia Velasquez MRN: [id number] Date of Birth: 1950/12/17

## 2021-04-20 ENCOUNTER — Encounter: Payer: Self-pay | Admitting: Physical Therapy

## 2021-04-20 ENCOUNTER — Other Ambulatory Visit: Payer: Self-pay

## 2021-04-20 ENCOUNTER — Ambulatory Visit: Payer: PPO | Admitting: Physical Therapy

## 2021-04-20 DIAGNOSIS — M6281 Muscle weakness (generalized): Secondary | ICD-10-CM | POA: Diagnosis not present

## 2021-04-20 DIAGNOSIS — R279 Unspecified lack of coordination: Secondary | ICD-10-CM

## 2021-04-20 DIAGNOSIS — G8929 Other chronic pain: Secondary | ICD-10-CM

## 2021-04-20 NOTE — Therapy (Signed)
[facility] [address], [place], [place], [address] Phone: [phone]   Fax:  [phone]  Physical Therapy Treatment  Patient Details  Name: Cynthia Velasquez MRN: [id number] Date of Birth: 08-07-1951 Referring Provider (PT): Eilene Ghazi NP   Encounter Date: 04/20/2021   PT End of Session - 04/20/21 1437     Visit Number 5    Number of Visits 10    Date for PT Re-Evaluation 05/15/21    Authorization Type Medicare 10th visit prog note    PT Start Time 1430    PT Stop Time [id number]    PT Time Calculation (min) 45 min    Activity Tolerance Patient tolerated treatment well    Behavior During Therapy [facility] for tasks assessed/performed             Past Medical History:  Diagnosis Date   Anal cancer ([place]) dx'd 05/2019   Anxiety    Arthritis    Bilateral breast cancer ([place]) 10/21/2013   Breast cancer ([place])    Cancer ([place])    Depression    Gallstones    GERD (gastroesophageal reflux disease)    doesn't take any meds for this   Headache    h/o migraines       History of bladder infections    History of hiatal hernia    History of migraine    Hyperlipidemia    takes Simvastatin daily   Hypertension    takes Hyzaar   Joint pain    Joint swelling    Lumbar stenosis    Neuropathy 09/07/2013   Pneumonia    Pre-diabetes     Past Surgical History:  Procedure Laterality Date   ABDOMINAL HYSTERECTOMY     bone spur removed from left foot     CHOLECYSTECTOMY     COLONOSCOPY     double mastectomy      HERNIA REPAIR     umbilical   right knee arthroscopy     right shoulder arthroscopy     surgery for hiatal hernia     TONSILLECTOMY     TOTAL HIP ARTHROPLASTY Left 02/12/2013   TOTAL HIP ARTHROPLASTY Left 02/12/2013   Procedure: LEFT TOTAL HIP ARTHROPLASTY;  Surgeon: Kerin Salen, MD;  Location: [place];  Service: Orthopedics;  Laterality: Left;   TOTAL HIP ARTHROPLASTY Right 05/03/2013   Procedure: TOTAL HIP  ARTHROPLASTY;  Surgeon: Kerin Salen, MD;  Location: [place];  Service: Orthopedics;  Laterality: Right;   TOTAL KNEE ARTHROPLASTY Right 02/27/2015   Procedure: TOTAL KNEE ARTHROPLASTY;  Surgeon: Frederik Pear, MD;  Location: [place];  Service: Orthopedics;  Laterality: Right;   TOTAL SHOULDER ARTHROPLASTY Right 09/12/2016   Procedure: RIGHT TOTAL SHOULDER ARTHROPLASTY;  Surgeon: Tania Ade, MD;  Location: [place];  Service: Orthopedics;  Laterality: Right;  RIGHT TOTAL SHOULDER ARTHROPLASTY    There were no vitals filed for this visit.   Subjective Assessment - 04/20/21 1618     Subjective I am excited to try this pool out! My back hurts a little today on the LT.    Currently in Pain? Yes    Pain Score 6     Pain Location Back    Pain Orientation Left;Lower    Pain Descriptors / Indicators Sore    Aggravating Factors  A lot of walking    Pain Relieving Factors The water feels great today, no pain.    Multiple Pain Sites No  Treatment: Patient seen for aquatic therapy today.  Treatment took place in water 2.5-4 feet deep depending upon activity.  Pt entered the pool via stairs both in & out. Water temp 92 degrees F.  Seated water bench with 75% submersion Pt performed seated LE AROM exercises 20x in all planes, pain assessment, concurrent verbal education and demo on water principles. Abdominal compressions with blue noodle 3 sec hold 10x.  Standing in mid chest depth: water walking all directions 4x each with noodle for postural support except side stepping. Wall exs: Bil 10x hip 4 ways, marching 20x holding onto noodle, min squats 10x, low back stretch on the wall, emphasis on LTLE 3x3 breaths.  Yellow noodle behind pt with PTA providing support for bicycle and lumbar decompression intermittently. PTA provided lateral sway for lateral stretching intermittently.                            PT Short Term Goals - 03/20/21 2021       PT SHORT TERM  GOAL #1   Title ind with initial HEP    Time 4    Period Weeks    Status New    Target Date 04/17/21      PT SHORT TERM GOAL #2   Title pt will report 30% improvement in LBP with rising, standing and walking    Time 4    Period Weeks    Status New               PT Long Term Goals - 03/20/21 2022       PT LONG TERM GOAL #1   Title The patient will be independent in safe self progression of HEP and/or aquatic ex program    Time 8    Period Weeks    Status New    Target Date 05/15/21      PT LONG TERM GOAL #2   Title The patient will have lumbo/pelvic/hip strength grossly 4-/5 needed to ambulate household distances without her cane    Time 8    Period Weeks    Status New      PT LONG TERM GOAL #3   Title The patient will report a 60% improvement in pain and function for driving, standing to cook and walking    Time 8    Period Weeks    Status New      PT LONG TERM GOAL #4   Title FOTO functional outcome score improved from 38% to 53%    Time 8    Period Weeks    Status New      PT LONG TERM GOAL #5   Title Pt will be able to exercise consistently for at least 20 minutes 2-3  days/week due to improved function and energy levels    Time 8    Period Weeks    Status New                   Plan - 04/20/21 1622     Clinical Impression Statement Pt arrives to first aquatic session with mild back pain. Pt was educated in water principles and how we are going to use them. Pt performed all basic aquatic exercises pain free and expressed "amazment" at how much my Lt leg can do in the water."    Personal Factors and Comorbidities Comorbidity 1;Comorbidity 2;Comorbidity 3+;Time since onset of injury/illness/exacerbation    Comorbidities chronic history of LBP  of 2 years; history of colorectal cancer and breast cancer; hip THR    Examination-Activity Limitations Locomotion Level;Lift;Stand;Stairs;Hygiene/Grooming    Examination-Participation Restrictions Meal  Prep;Cleaning;Community Activity;Shop;Driving    Stability/Clinical Decision Making Evolving/Moderate complexity    Rehab Potential Good    PT Frequency 2x / week    PT Duration 8 weeks    PT Treatment/Interventions ADLs/Self Care Home Management;Aquatic Therapy;Electrical Stimulation;Moist Heat;Traction;Functional mobility training;Therapeutic activities;Therapeutic exercise;Neuromuscular re-education;Manual techniques;Patient/family education;Dry needling;Taping    PT Next Visit Plan Pt would like to return for more aquatic sessions to continue progressing her exercises    PT Home Exercise Plan [id number]    Consulted and Agree with Plan of Care Patient             Patient will benefit from skilled therapeutic intervention in order to improve the following deficits and impairments:  Decreased range of motion, Decreased activity tolerance, Pain, Decreased strength, Decreased mobility  Visit Diagnosis: Muscle weakness (generalized)  Chronic right-sided low back pain with left-sided sciatica  Unspecified lack of coordination     Problem List Patient Active Problem List   Diagnosis Date Noted   Anal cancer ([place]) 06/13/2019   Hematuria, gross 03/11/2017   Status post total shoulder arthroplasty, right 09/12/2016   Arthritis of knee 02/27/2015   Primary osteoarthritis of right knee Valgus 02/24/2015   Neuropathy 09/07/2013   Breast cancer of upper-outer quadrant of left female breast ([place]) 08/09/2013   Breast cancer of upper-outer quadrant of right female breast ([place]) 08/09/2013   Osteoarthritis of right hip 05/03/2013   Osteoarthritis of left hip 02/15/2013    Alayia Meggison, PTA 04/20/2021, 4:27 PM  [facility] [address] [place], [place], [address] Phone: [phone]   Fax:  [phone]  Name: Ciara Soulia MRN: [id number] Date of Birth: 1951-06-28

## 2021-04-23 ENCOUNTER — Inpatient Hospital Stay: Payer: PPO

## 2021-04-23 ENCOUNTER — Inpatient Hospital Stay: Payer: PPO | Admitting: Hematology

## 2021-04-24 ENCOUNTER — Other Ambulatory Visit: Payer: Self-pay

## 2021-04-24 ENCOUNTER — Ambulatory Visit: Payer: PPO | Admitting: Physical Therapy

## 2021-04-24 DIAGNOSIS — M6281 Muscle weakness (generalized): Secondary | ICD-10-CM

## 2021-04-24 DIAGNOSIS — G8929 Other chronic pain: Secondary | ICD-10-CM

## 2021-04-24 NOTE — Therapy (Signed)
[facility] [address], [place], [place], [address] Phone: [phone]   Fax:  [phone]  Physical Therapy Treatment  Patient Details  Name: Cynthia Velasquez MRN: [id number] Date of Birth: [birth year] Referring Provider (PT): Eilene Ghazi NP   Encounter Date: 04/24/2021   PT End of Session - 04/24/21 1526     Visit Number 6    Number of Visits 10    Date for PT Re-Evaluation 05/15/21    Authorization Type Medicare 10th visit prog note    PT Start Time [id number]    PT Stop Time 1525    PT Time Calculation (min) 40 min    Activity Tolerance Patient tolerated treatment well             Past Medical History:  Diagnosis Date   Anal cancer ([place]) dx'd 05/2019   Anxiety    Arthritis    Bilateral breast cancer ([place]) 10/21/2013   Breast cancer ([place])    Cancer ([place])    Depression    Gallstones    GERD (gastroesophageal reflux disease)    doesn't take any meds for this   Headache    h/o migraines       History of bladder infections    History of hiatal hernia    History of migraine    Hyperlipidemia    takes Simvastatin daily   Hypertension    takes Hyzaar   Joint pain    Joint swelling    Lumbar stenosis    Neuropathy 09/07/2013   Pneumonia    Pre-diabetes     Past Surgical History:  Procedure Laterality Date   ABDOMINAL HYSTERECTOMY     bone spur removed from left foot     CHOLECYSTECTOMY     COLONOSCOPY     double mastectomy      HERNIA REPAIR     umbilical   right knee arthroscopy     right shoulder arthroscopy     surgery for hiatal hernia     TONSILLECTOMY     TOTAL HIP ARTHROPLASTY Left 02/12/2013   TOTAL HIP ARTHROPLASTY Left 02/12/2013   Procedure: LEFT TOTAL HIP ARTHROPLASTY;  Surgeon: Kerin Salen, MD;  Location: [place];  Service: Orthopedics;  Laterality: Left;   TOTAL HIP ARTHROPLASTY Right 05/03/2013   Procedure: TOTAL HIP ARTHROPLASTY;  Surgeon: Kerin Salen, MD;  Location: [place];   Service: Orthopedics;  Laterality: Right;   TOTAL KNEE ARTHROPLASTY Right 02/27/2015   Procedure: TOTAL KNEE ARTHROPLASTY;  Surgeon: Frederik Pear, MD;  Location: [place];  Service: Orthopedics;  Laterality: Right;   TOTAL SHOULDER ARTHROPLASTY Right 09/12/2016   Procedure: RIGHT TOTAL SHOULDER ARTHROPLASTY;  Surgeon: Tania Ade, MD;  Location: [place];  Service: Orthopedics;  Laterality: Right;  RIGHT TOTAL SHOULDER ARTHROPLASTY    There were no vitals filed for this visit.   Subjective Assessment - 04/24/21 1451     Subjective I really liked the pool.  No more falls.  Feeling pretty good today I can't complain.  I hurt for a little bit in my shoulders after last time but it was fine.    Pertinent History using SPC when in and  out of the house;  left THR    How long can you sit comfortably? if I don't move I'm fine but upon standing I know I've overdone it;  I'm bent over.    Currently in Pain? Yes    Pain Score 4  Pain Location Back    Pain Orientation Left                               [facility] Adult PT Treatment/Exercise - 04/24/21 0001       Lumbar Exercises: Aerobic   Nustep seat 9 7 min while discussing status      Lumbar Exercises: Standing   Other Standing Lumbar Exercises glute med "stand tall" ex holding to the back of the chair 10x right/left      Lumbar Exercises: Seated   Sit to Stand 10 reps   chair + cushion encouraged wide knees  for knee relief   Sit to Stand Limitations mat table plus cushion    Other Seated Lumbar Exercises 20# cable pulley rows 20x    Other Seated Lumbar Exercises 20# cable pulley small arc rotation 10x each way      Knee/Hip Exercises: Standing   Forward Step Up Right;Left;1 set;5 reps    Walking with Sports Cord 20# resisted backward 5x                      PT Short Term Goals - 04/24/21 1530       PT SHORT TERM GOAL #1   Title ind with initial HEP    Status Achieved      PT SHORT TERM GOAL #2    Title pt will report 30% improvement in LBP with rising, standing and walking    Status Achieved               PT Long Term Goals - 03/20/21 2022       PT LONG TERM GOAL #1   Title The patient will be independent in safe self progression of HEP and/or aquatic ex program    Time 8    Period Weeks    Status New    Target Date 05/15/21      PT LONG TERM GOAL #2   Title The patient will have lumbo/pelvic/hip strength grossly 4-/5 needed to ambulate household distances without her cane    Time 8    Period Weeks    Status New      PT LONG TERM GOAL #3   Title The patient will report a 60% improvement in pain and function for driving, standing to cook and walking    Time 8    Period Weeks    Status New      PT LONG TERM GOAL #4   Title FOTO functional outcome score improved from 38% to 53%    Time 8    Period Weeks    Status New      PT LONG TERM GOAL #5   Title Pt will be able to exercise consistently for at least 20 minutes 2-3  days/week due to improved function and energy levels    Time 8    Period Weeks    Status New                   Plan - 04/24/21 1526     Clinical Impression Statement The patient presents without her cane today with a lower pain rating than she has had in weeks.  She is able to participate in lower to moderate intensity exercise with more time in the standing position vs. seated.  She had a very positive response to aquatic PT and although it conflicts with her work  schedule she plans to do another few sessions to establish independence with a pool routine.  Therapist monitoring response throughout the session and modifying treatment accordingly.  Progressing with rehab goals.    Personal Factors and Comorbidities Comorbidity 1;Comorbidity 2;Comorbidity 3+;Time since onset of injury/illness/exacerbation    Comorbidities chronic history of LBP of 2 years; history of colorectal cancer and breast cancer; hip THR    Rehab Potential Good     PT Frequency 2x / week    PT Duration 8 weeks    PT Treatment/Interventions ADLs/Self Care Home Management;Aquatic Therapy;Electrical Stimulation;Moist Heat;Traction;Functional mobility training;Therapeutic activities;Therapeutic exercise;Neuromuscular re-education;Manual techniques;Patient/family education;Dry needling;Taping    PT Next Visit Plan aquatic PT;  low to moderate intensity seated and standing ex    PT Home Exercise Plan [id number]             Patient will benefit from skilled therapeutic intervention in order to improve the following deficits and impairments:  Decreased range of motion, Decreased activity tolerance, Pain, Decreased strength, Decreased mobility  Visit Diagnosis: Muscle weakness (generalized)  Chronic right-sided low back pain with left-sided sciatica     Problem List Patient Active Problem List   Diagnosis Date Noted   Anal cancer ([place]) 06/13/2019   Hematuria, gross 03/11/2017   Status post total shoulder arthroplasty, right 09/12/2016   Arthritis of knee 02/27/2015   Primary osteoarthritis of right knee Valgus 02/24/2015   Neuropathy 09/07/2013   Breast cancer of upper-outer quadrant of left female breast ([place]) 08/09/2013   Breast cancer of upper-outer quadrant of right female breast ([place]) 08/09/2013   Osteoarthritis of right hip 05/03/2013   Osteoarthritis of left hip 02/15/2013   Ruben Im, PT 04/24/21 3:33 PM Phone: [phone] Fax: [phone]  Alvera Singh 04/24/2021, 3:32 PM  [facility] [address], [place], [place], [address] Phone: [phone]   Fax:  [phone]  Name: Cynthia Velasquez MRN: [id number] Date of Birth: [birth year]

## 2021-04-27 ENCOUNTER — Inpatient Hospital Stay: Payer: PPO | Attending: Hematology

## 2021-04-27 ENCOUNTER — Other Ambulatory Visit: Payer: Self-pay

## 2021-04-27 ENCOUNTER — Encounter: Payer: Self-pay | Admitting: Physical Therapy

## 2021-04-27 ENCOUNTER — Encounter: Payer: Self-pay | Admitting: Hematology

## 2021-04-27 ENCOUNTER — Ambulatory Visit: Payer: PPO | Admitting: Physical Therapy

## 2021-04-27 ENCOUNTER — Inpatient Hospital Stay: Payer: PPO | Admitting: Hematology

## 2021-04-27 VITALS — BP 166/88 | HR 67 | Temp 98.2°F | Resp 17 | Wt 220.5 lb

## 2021-04-27 DIAGNOSIS — R296 Repeated falls: Secondary | ICD-10-CM | POA: Insufficient documentation

## 2021-04-27 DIAGNOSIS — C210 Malignant neoplasm of anus, unspecified: Secondary | ICD-10-CM

## 2021-04-27 DIAGNOSIS — I10 Essential (primary) hypertension: Secondary | ICD-10-CM | POA: Insufficient documentation

## 2021-04-27 DIAGNOSIS — M6281 Muscle weakness (generalized): Secondary | ICD-10-CM | POA: Diagnosis not present

## 2021-04-27 DIAGNOSIS — Z853 Personal history of malignant neoplasm of breast: Secondary | ICD-10-CM | POA: Diagnosis not present

## 2021-04-27 DIAGNOSIS — G8929 Other chronic pain: Secondary | ICD-10-CM

## 2021-04-27 DIAGNOSIS — Z9221 Personal history of antineoplastic chemotherapy: Secondary | ICD-10-CM | POA: Diagnosis not present

## 2021-04-27 DIAGNOSIS — K5900 Constipation, unspecified: Secondary | ICD-10-CM | POA: Diagnosis not present

## 2021-04-27 DIAGNOSIS — C50412 Malignant neoplasm of upper-outer quadrant of left female breast: Secondary | ICD-10-CM

## 2021-04-27 DIAGNOSIS — Z9013 Acquired absence of bilateral breasts and nipples: Secondary | ICD-10-CM | POA: Diagnosis not present

## 2021-04-27 DIAGNOSIS — Z170 Estrogen receptor positive status [ER+]: Secondary | ICD-10-CM

## 2021-04-27 DIAGNOSIS — R279 Unspecified lack of coordination: Secondary | ICD-10-CM

## 2021-04-27 LAB — CBC WITH DIFFERENTIAL (CANCER CENTER ONLY)
Abs Immature Granulocytes: 0.01 10*3/uL (ref 0.00–0.07)
Basophils Absolute: 0 10*3/uL (ref 0.0–0.1)
Basophils Relative: 1 %
Eosinophils Absolute: 0.1 10*3/uL (ref 0.0–0.5)
Eosinophils Relative: 3 %
HCT: 39.5 % (ref 36.0–46.0)
Hemoglobin: 12.7 g/dL (ref 12.0–15.0)
Immature Granulocytes: 0 %
Lymphocytes Relative: 27 %
Lymphs Abs: 1.1 10*3/uL (ref 0.7–4.0)
MCH: 27.7 pg (ref 26.0–34.0)
MCHC: 32.2 g/dL (ref 30.0–36.0)
MCV: 86.1 fL (ref 80.0–100.0)
Monocytes Absolute: 0.4 10*3/uL (ref 0.1–1.0)
Monocytes Relative: 10 %
Neutro Abs: 2.4 10*3/uL (ref 1.7–7.7)
Neutrophils Relative %: 59 %
Platelet Count: 186 10*3/uL (ref 150–400)
RBC: 4.59 MIL/uL (ref 3.87–5.11)
RDW: 14.4 % (ref 11.5–15.5)
WBC Count: 4.1 10*3/uL (ref 4.0–10.5)
nRBC: 0 % (ref 0.0–0.2)

## 2021-04-27 LAB — CMP (CANCER CENTER ONLY)
ALT: 15 U/L (ref 0–44)
AST: 17 U/L (ref 15–41)
Albumin: 3.5 g/dL (ref 3.5–5.0)
Alkaline Phosphatase: 100 U/L (ref 38–126)
Anion gap: 6 (ref 5–15)
BUN: 15 mg/dL (ref 8–23)
CO2: 25 mmol/L (ref 22–32)
Calcium: 9 mg/dL (ref 8.9–10.3)
Chloride: 111 mmol/L (ref 98–111)
Creatinine: 0.95 mg/dL (ref 0.44–1.00)
GFR, Estimated: >60 mL/min (ref >60)
Glucose, Bld: 96 mg/dL (ref 70–99)
Potassium: 3.9 mmol/L (ref 3.5–5.1)
Sodium: 142 mmol/L (ref 135–145)
Total Bilirubin: 0.5 mg/dL (ref 0.3–1.2)
Total Protein: 6.7 g/dL (ref 6.5–8.1)

## 2021-04-27 NOTE — Therapy (Signed)
[facility] [address], [place], [place], [address] Phone: [phone]   Fax:  [phone]  Physical Therapy Treatment  Patient Details  Name: Cynthia Velasquez MRN: [id number] Date of Birth: [birth year] Referring Provider (PT): Eilene Ghazi NP   Encounter Date: 04/27/2021   PT End of Session - 04/27/21 1616     Visit Number 7    Number of Visits 10    Date for PT Re-Evaluation 05/15/21    Authorization Type Medicare 10th visit prog note    PT Start Time 1400   pt late, work   PT Stop Time 1438    PT Time Calculation (min) 38 min    Activity Tolerance Patient tolerated treatment well    Behavior During Therapy [facility] for tasks assessed/performed             Past Medical History:  Diagnosis Date   Anal cancer ([place]) dx'd 05/2019   Anxiety    Arthritis    Bilateral breast cancer ([place]) 10/21/2013   Breast cancer ([place])    Cancer ([place])    Depression    Gallstones    GERD (gastroesophageal reflux disease)    doesn't take any meds for this   Headache    h/o migraines       History of bladder infections    History of hiatal hernia    History of migraine    Hyperlipidemia    takes Simvastatin daily   Hypertension    takes Hyzaar   Joint pain    Joint swelling    Lumbar stenosis    Neuropathy 09/07/2013   Pneumonia    Pre-diabetes     Past Surgical History:  Procedure Laterality Date   ABDOMINAL HYSTERECTOMY     bone spur removed from left foot     CHOLECYSTECTOMY     COLONOSCOPY     double mastectomy      HERNIA REPAIR     umbilical   right knee arthroscopy     right shoulder arthroscopy     surgery for hiatal hernia     TONSILLECTOMY     TOTAL HIP ARTHROPLASTY Left 02/12/2013   TOTAL HIP ARTHROPLASTY Left 02/12/2013   Procedure: LEFT TOTAL HIP ARTHROPLASTY;  Surgeon: Kerin Salen, MD;  Location: [place];  Service: Orthopedics;  Laterality: Left;   TOTAL HIP ARTHROPLASTY Right 05/03/2013    Procedure: TOTAL HIP ARTHROPLASTY;  Surgeon: Kerin Salen, MD;  Location: [place];  Service: Orthopedics;  Laterality: Right;   TOTAL KNEE ARTHROPLASTY Right 02/27/2015   Procedure: TOTAL KNEE ARTHROPLASTY;  Surgeon: Frederik Pear, MD;  Location: [place];  Service: Orthopedics;  Laterality: Right;   TOTAL SHOULDER ARTHROPLASTY Right 09/12/2016   Procedure: RIGHT TOTAL SHOULDER ARTHROPLASTY;  Surgeon: Tania Ade, MD;  Location: [place];  Service: Orthopedics;  Laterality: Right;  RIGHT TOTAL SHOULDER ARTHROPLASTY    There were no vitals filed for this visit.   Subjective Assessment - 04/27/21 1615     Subjective I feel amazing after exercising in the pool. Currently denies pain.    Pertinent History using SPC when in and  out of the house;  left THR    Limitations Sitting;Walking;Standing;House hold activities    Currently in Pain? No/denies            Treatment: Patient seen for aquatic therapy today.  Treatment took place in water 2.5-4 feet deep depending upon activity.  Pt entered the pool via stairs, step to  step with moderate use of rails. Water temp 92 degrees F.  Seated water bench with 75% submersion Pt performed seated LE AROM exercises 20x in all planes, pain assessment concurrent. Discussed options to continue with aquatics post PT. Pt is going to see where the closest [facility] is to her as she has [employer], government.  Standing in mid waist depth: Water walking with blue noodle 6x in each direction. Wall exercises Bil 10-15x with VC to increase speed in order to increase resistance: hip 3 ways, marching, squats, heel raises, low back stretch/modified standing childs pose 3x 10 sec.  Horizontal decompression float with 100% flotation x 1mn, PTA SBA as pt was nervous.                              PT Short Term Goals - 04/24/21 1530       PT SHORT TERM GOAL #1   Title ind with initial HEP    Status Achieved      PT SHORT TERM GOAL #2   Title pt will  report 30% improvement in LBP with rising, standing and walking    Status Achieved               PT Long Term Goals - 03/20/21 2022       PT LONG TERM GOAL #1   Title The patient will be independent in safe self progression of HEP and/or aquatic ex program    Time 8    Period Weeks    Status New    Target Date 05/15/21      PT LONG TERM GOAL #2   Title The patient will have lumbo/pelvic/hip strength grossly 4-/5 needed to ambulate household distances without her cane    Time 8    Period Weeks    Status New      PT LONG TERM GOAL #3   Title The patient will report a 60% improvement in pain and function for driving, standing to cook and walking    Time 8    Period Weeks    Status New      PT LONG TERM GOAL #4   Title FOTO functional outcome score improved from 38% to 53%    Time 8    Period Weeks    Status New      PT LONG TERM GOAL #5   Title Pt will be able to exercise consistently for at least 20 minutes 2-3  days/week due to improved function and energy levels    Time 8    Period Weeks    Status New                   Plan - 04/27/21 1618     Clinical Impression Statement Pt arrived late for aquatics but we were able to get in just over 30 minutes of exercise. Pt was able to "hustle/move fast" as she trying to get to the pool. Pt arrives pain free and remained pain free throughout. Pt was able to provide greater speedss and therefore more resistance to her LE.    Personal Factors and Comorbidities Comorbidity 1;Comorbidity 2;Comorbidity 3+;Time since onset of injury/illness/exacerbation    Comorbidities chronic history of LBP of 2 years; history of colorectal cancer and breast cancer; hip THR    Examination-Activity Limitations Locomotion Level;Lift;Stand;Stairs;Hygiene/Grooming    Examination-Participation Restrictions Meal Prep;Cleaning;Community Activity;Shop;Driving    Stability/Clinical Decision Making Evolving/Moderate complexity    Rehab  Potential Good    PT Frequency 2x / week    PT Duration 8 weeks    PT Treatment/Interventions ADLs/Self Care Home Management;Aquatic Therapy;Electrical Stimulation;Moist Heat;Traction;Functional mobility training;Therapeutic activities;Therapeutic exercise;Neuromuscular re-education;Manual techniques;Patient/family education;Dry needling;Taping    PT Next Visit Plan aquatic PT;  low to moderate intensity seated and standing ex    PT Home Exercise Plan [id number]    Consulted and Agree with Plan of Care Patient             Patient will benefit from skilled therapeutic intervention in order to improve the following deficits and impairments:  Decreased range of motion, Decreased activity tolerance, Pain, Decreased strength, Decreased mobility  Visit Diagnosis: Chronic right-sided low back pain with left-sided sciatica  Muscle weakness (generalized)  Unspecified lack of coordination     Problem List Patient Active Problem List   Diagnosis Date Noted   Anal cancer ([place]) 06/13/2019   Hematuria, gross 03/11/2017   Status post total shoulder arthroplasty, right 09/12/2016   Arthritis of knee 02/27/2015   Primary osteoarthritis of right knee Valgus 02/24/2015   Neuropathy 09/07/2013   Breast cancer of upper-outer quadrant of left female breast ([place]) 08/09/2013   Breast cancer of upper-outer quadrant of right female breast ([place]) 08/09/2013   Osteoarthritis of right hip 05/03/2013   Osteoarthritis of left hip 02/15/2013    Cynthia Velasquez, PTA 04/27/2021, 4:22 PM  [facility] [address], [place], [place], [address] Phone: [phone]   Fax:  [phone]  Name: Cynthia Velasquez MRN: [id number] Date of Birth: July 30, 1951

## 2021-04-27 NOTE — Progress Notes (Signed)
[facility]   Telephone:([phone] [phone]   Clinic Follow up Note   Patient Care Team: Kathyrn Lass, MD as PCP - General (Family Medicine) Wonda Horner, MD as Consulting Physician (Gastroenterology) Truitt Merle, MD as Consulting Physician (Hematology) Alla Feeling, NP as Nurse Practitioner (Nurse Practitioner) Kyung Rudd, MD as Consulting Physician (Radiation Oncology) Michael Boston, MD as Consulting Physician (General Surgery)  Date of Service:  04/27/2021  CHIEF COMPLAINT: f/u of rectal cancer  SUMMARY OF ONCOLOGIC HISTORY: Oncology History Overview Note  Breast cancer of upper-outer quadrant of right female breast, (ER/PR positive, her2/Neu negative)   Primary site: Breast (Right)   Staging method: AJCC 7th Edition   Clinical: Stage IA (T1a, N0, cM0) signed by [name], MD on 09/07/2013 11:14 AM   Pathologic: Stage IA (T1a, N0, cM0) signed by [name], MD on 09/07/2013 11:14 AM   Summary: Stage IA (T1a, N0, cM0)  Breast cancer of upper-outer quadrant of left female breast (ER/PR/Her2Neu negative)   Primary site: Breast (Left)   Staging method: AJCC 7th Edition   Clinical: Stage IIA (T2, N0, cM0) signed by [name], MD on 09/07/2013 11:35 AM   Pathologic: Stage IIA (T2, N0, cM0) signed by [name], MD on 09/07/2013 11:35 AM   Summary: Stage IIA (T2, N0, cM0)     Breast cancer of upper-outer quadrant of left female breast ([place])  07/10/2007 Procedure   US biopsy showed invasive ductal carcinoma 0.8cm, Nottingham grade 3   07/30/2007 Surgery   She had left breast lumpectomy and LN biopsy which showed high grade invasive ductal cancer Nottingham grade 3, 2.7 cm, ER/PR/Her2 neu negative   11/24/2007 Surgery   Patient elected for bilateral mastectomy   09/07/2008 - 03/08/2009 Chemotherapy   dates are approximate: she received adriamycin and cytoxan followed by Taxol   03/08/2009 - 05/08/2009 Radiation Therapy   dates are approximate,  she received XRT    Breast cancer of upper-outer quadrant of right female breast ([place])  07/10/2006 Procedure   stereotactic biopsy showed atypical hyperplasia   10/23/2006 Surgery   right lumpectomy showed invasive ductal ca (Nottingham grade 1)  42m and DCIS, ER/PR positive Her 2 negative   10/09/2007 - 10/08/2012 Chemotherapy   Patient was placed on Tamoxifen   03/24/2017 Imaging   No acute findings. No evidence of recurrent carcinoma or metastatic disease   [place] cancer ([place]  06/04/2019 Procedure   Colonoscopy per Dr. SAnson Fret Findings-the digital rectal exam revealed a 3 cm diameter firm rectal mass.  The mass was noncircumferential and located predominantly at the right bowel wall at the anorectal junction. A nonobstructing mass was found at the anus and in the rectum    06/04/2019 Initial Biopsy   Follow pathology: Large intestine, rectum biopsy: Invasive well to moderately differentiated squamous cell carcinoma.  No rectal mucosa present.  There is strong diffuse expression of P 16 immunostain.  CDX 2, p63 and mCEA immunostains are also used in the diagnostic work-up of the case.   06/04/2019 Initial Diagnosis   Anal cancer ([place]   06/21/2019 PET scan   IMPRESSION: 1. Anorectal primary. No hypermetabolic metastatic disease within the chest, abdomen, or pelvis. Perirectal nodes, at least 1 of which is new since 02/26/2018 CT, suspicious based on size and interval development. 2. Mild limitations secondary to beam hardening artifact from bilateral hip arthroplasty. 3.  Aortic Atherosclerosis (ICD10-I70.0).   06/28/2019 Cancer Staging   Staging form: Anus, AJCC 8th Edition -  Clinical stage from 06/28/2019: Stage IIA (cT2, cN0, cM0) - Signed by Alla Feeling, NP on 06/28/2019   06/28/2019 - 07/26/2019 Chemotherapy   concurrent chemoRT with Mitomycin and 5FU on week 1 and week 5 on starting 06/28/19. Last dose on 07/26/19   06/28/2019 - 08/09/2019 Radiation Therapy    concurrent chemoRT with Dr [name] 06/28/19-08/09/19   11/01/2019 PET scan   IMPRESSION: 1. Marked interval decrease in hypermetabolism noted at the level of the anal rectal primary. No evidence for hypermetabolic metastatic disease in the chest, abdomen, or pelvis. The perirectal lymph nodes identified previously have resolved in the interval. 2.  Aortic Atherosclerois (ICD10-170.0)   08/07/2020 Imaging   CT CAP  IMPRESSION: 1. No findings for residual/recurrent anal tumor, regional lymphadenopathy or metastatic disease. 2. Status post cholecystectomy. No biliary dilatation. 3. Small anterior abdominal wall hernia containing fat. 4. Aortic atherosclerosis.   Aortic Atherosclerosis (ICD10-I70.0).        CURRENT THERAPY:  Surveillance  INTERVAL HISTORY:  Cynthia Velasquez is here for a follow up of rectal cancer. She was last seen by me on 01/22/21. She presents to the clinic alone.  She is doing well overall. Her main concern is her BM, she is constipated, with small BM, she takes linzess which cause watery BM and she can not go outside. No abdominal pain, nausea. Appetite is normal and weight stable  She has some balance issue, uses a crane and had two episodes of fall in past month  All other systems were reviewed with the patient and are negative.  MEDICAL HISTORY:  Past Medical History:  Diagnosis Date   Anal cancer ([place]) dx'd 05/2019   Anxiety    Arthritis    Bilateral breast cancer ([place]) 10/21/2013   Breast cancer ([place])    Cancer ([place])    Depression    Gallstones    GERD (gastroesophageal reflux disease)    doesn't take any meds for this   Headache    h/o migraines       History of bladder infections    History of hiatal hernia    History of migraine    Hyperlipidemia    takes Simvastatin daily   Hypertension    takes Hyzaar   Joint pain    Joint swelling    Lumbar stenosis    Neuropathy 09/07/2013   Pneumonia    Pre-diabetes     SURGICAL HISTORY: Past  Surgical History:  Procedure Laterality Date   ABDOMINAL HYSTERECTOMY     bone spur removed from left foot     CHOLECYSTECTOMY     COLONOSCOPY     double mastectomy      HERNIA REPAIR     umbilical   right knee arthroscopy     right shoulder arthroscopy     surgery for hiatal hernia     TONSILLECTOMY     TOTAL HIP ARTHROPLASTY Left 02/12/2013   TOTAL HIP ARTHROPLASTY Left 02/12/2013   Procedure: LEFT TOTAL HIP ARTHROPLASTY;  Surgeon: Kerin Salen, MD;  Location: [facility];  Service: Orthopedics;  Laterality: Left;   TOTAL HIP ARTHROPLASTY Right 05/03/2013   Procedure: TOTAL HIP ARTHROPLASTY;  Surgeon: Kerin Salen, MD;  Location: [place];  Service: Orthopedics;  Laterality: Right;   TOTAL KNEE ARTHROPLASTY Right 02/27/2015   Procedure: TOTAL KNEE ARTHROPLASTY;  Surgeon: Frederik Pear, MD;  Location: [facility];  Service: Orthopedics;  Laterality: Right;   TOTAL SHOULDER ARTHROPLASTY Right 09/12/2016   Procedure: RIGHT TOTAL SHOULDER ARTHROPLASTY;  Surgeon: Justin Chandler, MD;  [facility];  Service: Orthopedics;  Laterality: Right;  RIGHT TOTAL SHOULDER ARTHROPLASTY    I have reviewed the social history and family history with the patient and they are unchanged from previous note.  ALLERGIES:  is allergic to oxycodone-acetaminophen, codeine, and vicodin [hydrocodone-acetaminophen].  MEDICATIONS:  Current Outpatient Medications  Medication Sig Dispense Refill   LINZESS 72 MCG capsule      losartan (COZAAR) 100 MG tablet TK 1 T PO D  6   ondansetron (ZOFRAN ODT) 4 MG disintegrating tablet Take 1 tablet (4 mg total) by mouth every 8 (eight) hours as needed for nausea or vomiting. (Patient not taking: Reported on 03/20/2021) 6 tablet 0   PROAIR HFA 108 (90 Base) MCG/ACT inhaler 2 inhalations every 4-6 hours as needed. (Patient taking differently: Inhale 2 puffs into the lungs every 6 (six) hours as needed for wheezing or shortness of breath.) 18 g 1   rosuvastatin (CRESTOR) 40 MG tablet Take 40 mg  by mouth daily.     No current facility-administered medications for this visit.    PHYSICAL EXAMINATION: ECOG PERFORMANCE STATUS: 0 - Asymptomatic  Vitals:   04/27/21 0842 04/27/21 0843  BP: (!) 162/88 (!) 166/88  Pulse: 67 67  Resp:  17  Temp: 98.2 F (36.8 C) 98.2 F (36.8 C)  SpO2:  99%   Wt Readings from Last 3 Encounters:  04/27/21 220 lb 8 oz (100 kg)  04/04/21 223 lb (101.2 kg)  01/22/21 222 lb 4.8 oz (100.8 kg)     GENERAL:alert, no distress and comfortable SKIN: skin color, texture, turgor are normal, no rashes or significant lesions EYES: normal, Conjunctiva are pink and non-injected, sclera clear NECK: supple, thyroid normal size, non-tender, without nodularity LYMPH:  no palpable lymphadenopathy in the cervical, axillary  LUNGS: clear to auscultation and percussion with normal breathing effort HEART: regular rate & rhythm and no murmurs and no lower extremity edema ABDOMEN:abdomen soft, non-tender and normal bowel sounds Musculoskeletal:no cyanosis of digits and no clubbing  NEURO: alert & oriented x 3 with fluent speech, no focal motor/sensory deficits Rectal exam: No visible lesion, no palpable mass or nodule in the rectum, no blood on glove  LABORATORY DATA:  I have reviewed the data as listed CBC Latest Ref Rng & Units 04/27/2021 01/22/2021 07/24/2020  WBC 4.0 - 10.5 K/uL 4.1 4.3 3.7(L)  Hemoglobin 12.0 - 15.0 g/dL 12.7 13.1 12.1  Hematocrit 36.0 - 46.0 % 39.5 41.4 38.2  Platelets 150 - 400 K/uL 186 172 175     CMP Latest Ref Rng & Units 04/27/2021 01/22/2021 07/24/2020  Glucose 70 - 99 mg/dL 96 116(H) 97  BUN 8 - 23 mg/dL 15 16 14  Creatinine 0.44 - 1.00 mg/dL 0.95 0.99 0.90  Sodium 135 - 145 mmol/L 142 143 139  Potassium 3.5 - 5.1 mmol/L 3.9 4.0 3.7  Chloride 98 - 111 mmol/L 111 108 106  CO2 22 - 32 mmol/L 25 26 27  Calcium 8.9 - 10.3 mg/dL 9.0 9.2 8.9  Total Protein 6.5 - 8.1 g/dL 6.7 7.0 6.6  Total Bilirubin 0.3 - 1.2 mg/dL 0.5 0.4 0.5  Alkaline  Phos 38 - 126 U/L 100 98 78  AST 15 - 41 U/L 17 15 18  ALT 0 - 44 U/L 15 9 15      RADIOGRAPHIC STUDIES: I have personally reviewed the radiological images as listed and agreed with the findings in the report. No results found.   ASSESSMENT &   PLAN:  Chancy Nuon is a 69 y.o. female with   1. Anal Squamous Cell Carcinoma, cT2N0M0 -She was diagnosed in 05/2019. Workup showed a 3 cm mass at the anorectal junction, biopsy confirmed well to moderately differentiated squamous cell carcinoma. 06/21/19 PET scan showed no metastasis.  -She completed standard treatment with concurrent chemoRT with Mitomycin and 5FU on 08/09/19.  -PET scan on 11/01/19 showed near complete response.  Surveillance CT scan from November 2021 showed no evidence of residual or recurrent cancer. -She is clinically doing well, except chronic GI symptoms.  Exam was unremarkable, including rectal exam.  Lab reviewed, no concern for clinical recurrence. -Follow-up in 3 months with lab and surveillant CT chest, abdomen pelvis with contrast a few days before  2. Constipation, bloating  -She notes last month she started having GI symptoms of rectal urgency without output. She was seen by Dr Gross who examined her, nothing was found.  -She notes for 2 weeks after her exam, she had diarrhea. With 1 imodium to help the diarrhea, she has been constipated since.  -She has been on Linzess before along with Miralax, but this has not helped so far even with dose increase. She notes her GI Dr Genam has retired. Her last colonoscopy was 2021 before Dr Ganem retired.  -I recommend she try to use Colace and MiraLAX for her constipation, she has diarrhea from Linzess    3. H/o bilateral breast cancer, Genetic negative  2007: right breast biopsy showed atypical hyperplasia, s/p right lumpectomy in 06/2006 ER/PR+/HER2-, grade 1. Completed tamoxifen from 09/2007 - 09/2012 2008: left breast invasive ductal carcinoma, triple negative, grade 3 s/p  lumpectomy in 07/2007 and eventual bilateral mastectomy; s/p adjuvant chemo AC-T and radiation  -Per pt her Genetics Testing was negative. She was adopted, no known family history     4. HTN -on losartan. Continue to f/u with PCP   5. Back pain  -She notes for the past 2 months having sacral back pain that radiates and is exacerbated by sitting or standing for too long.  -Her 02/05/21 lumbar spine MRI was negative for metastatic disease     PLAN: -Follow-up in 3 months with lab and surveillant CT chest, abdomen pelvis with contrast a few days before   No problem-specific Assessment & Plan notes found for this encounter.   Orders Placed This Encounter  Procedures   CT CHEST ABDOMEN PELVIS W CONTRAST    Standing Status:   Future    Standing Expiration Date:   04/27/2022    Order Specific Question:   If indicated for the ordered procedure, I authorize the administration of contrast media per Radiology protocol    Answer:   Yes    Order Specific Question:   Preferred imaging location?    Answer:   [facility]    Order Specific Question:   Release to patient    Answer:   Immediate    Order Specific Question:   Is Oral Contrast requested for this exam?    Answer:   Yes, Per Radiology protocol    Order Specific Question:   Reason for Exam (SYMPTOM  OR DIAGNOSIS REQUIRED)    Answer:   rule out recurernce of cancer    All questions were answered. The patient knows to call the clinic with any problems, questions or concerns. No barriers to learning was detected. The total time spent in the appointment was 30 minutes.      , MD 04/27/2021   I,   Wilburn Mylar, am acting as scribe for Truitt Merle, MD.   I have reviewed the above documentation for accuracy and completeness, and I agree with the above.

## 2021-05-01 ENCOUNTER — Ambulatory Visit: Payer: PPO | Admitting: Physical Therapy

## 2021-05-01 ENCOUNTER — Other Ambulatory Visit: Payer: Self-pay

## 2021-05-01 DIAGNOSIS — G8929 Other chronic pain: Secondary | ICD-10-CM

## 2021-05-01 DIAGNOSIS — M6281 Muscle weakness (generalized): Secondary | ICD-10-CM

## 2021-05-01 DIAGNOSIS — R279 Unspecified lack of coordination: Secondary | ICD-10-CM

## 2021-05-01 NOTE — Therapy (Signed)
[facility] [address], [place], [place], [address] Phone: [phone]   Fax:  [phone]  Physical Therapy Treatment  Patient Details  Name: Cynthia Velasquez MRN: [id number] Date of Birth: [birth year] Referring Provider (PT): Eilene Ghazi NP   Encounter Date: 05/01/2021   PT End of Session - 05/01/21 1420     Visit Number 8    Number of Visits 10    Date for PT Re-Evaluation 05/15/21    Authorization Type Medicare 10th visit prog note    PT Start Time 8546    PT Stop Time 1443    PT Time Calculation (min) 40 min    Activity Tolerance Patient tolerated treatment well             Past Medical History:  Diagnosis Date   Anal cancer ([place]) dx'd 05/2019   Anxiety    Arthritis    Bilateral breast cancer ([place]) 10/21/2013   Breast cancer ([place])    Cancer ([place])    Depression    Gallstones    GERD (gastroesophageal reflux disease)    doesn't take any meds for this   Headache    h/o migraines       History of bladder infections    History of hiatal hernia    History of migraine    Hyperlipidemia    takes Simvastatin daily   Hypertension    takes Hyzaar   Joint pain    Joint swelling    Lumbar stenosis    Neuropathy 09/07/2013   Pneumonia    Pre-diabetes     Past Surgical History:  Procedure Laterality Date   ABDOMINAL HYSTERECTOMY     bone spur removed from left foot     CHOLECYSTECTOMY     COLONOSCOPY     double mastectomy      HERNIA REPAIR     umbilical   right knee arthroscopy     right shoulder arthroscopy     surgery for hiatal hernia     TONSILLECTOMY     TOTAL HIP ARTHROPLASTY Left 02/12/2013   TOTAL HIP ARTHROPLASTY Left 02/12/2013   Procedure: LEFT TOTAL HIP ARTHROPLASTY;  Surgeon: Kerin Salen, MD;  Location: [place];  Service: Orthopedics;  Laterality: Left;   TOTAL HIP ARTHROPLASTY Right 05/03/2013   Procedure: TOTAL HIP ARTHROPLASTY;  Surgeon: Kerin Salen, MD;  Location: [place];   Service: Orthopedics;  Laterality: Right;   TOTAL KNEE ARTHROPLASTY Right 02/27/2015   Procedure: TOTAL KNEE ARTHROPLASTY;  Surgeon: Frederik Pear, MD;  Location: [place];  Service: Orthopedics;  Laterality: Right;   TOTAL SHOULDER ARTHROPLASTY Right 09/12/2016   Procedure: RIGHT TOTAL SHOULDER ARTHROPLASTY;  Surgeon: Tania Ade, MD;  Location: [place];  Service: Orthopedics;  Laterality: Right;  RIGHT TOTAL SHOULDER ARTHROPLASTY    There were no vitals filed for this visit.   Subjective Assessment - 05/01/21 1402     Subjective I felt OK after last land session but a little achey when I got home.  I love the pool.   I was running late and even ran into the building.   I have sciatica on my left side from sweeping at the camp.  I can't stand up straight sometimes, bent to the right, after sitting on the couch.  "I'm not doing good today."   Patient not using SPC today.    Pertinent History using SPC when in and  out of the house;  left THR  How long can you sit comfortably? if I don't move I'm fine but upon standing I know I've overdone it;  I'm bent over.    Patient Stated Goals Not to be in pain; not to rely on cane; If I wasn't hurting I would  get in the pool;  get back to quilting; walk more; cooking    Currently in Pain? Yes   twinging   Pain Score 5    twinges   Pain Location Back    Pain Orientation Left    Pain Type Chronic pain                [facility] PT Assessment - 05/01/21 0001       Observation/Other Assessments   Focus on Therapeutic Outcomes (FOTO)  53%                           [facility] Adult PT Treatment/Exercise - 05/01/21 0001       Therapeutic Activites    Other Therapeutic Activities hip hinge with golf club 10x; sweeping simulation      Lumbar Exercises: Stretches   Other Lumbar Stretch Exercise 2nd step hip flexor stretch with UE raise 6x right/left    Other Lumbar Stretch Exercise seated forward bend relieves buttock symptoms green ball  rollouts      Lumbar Exercises: Aerobic   Nustep seat 9 7 min whilediscussing status      Lumbar Exercises: Standing   Other Standing Lumbar Exercises mini rotations staggered feet 10x   "that set that off"     Lumbar Exercises: Seated   Other Seated Lumbar Exercises foam roll push down 10x                      PT Short Term Goals - 04/24/21 1530       PT SHORT TERM GOAL #1   Title ind with initial HEP    Status Achieved      PT SHORT TERM GOAL #2   Title pt will report 30% improvement in LBP with rising, standing and walking    Status Achieved               PT Long Term Goals - 05/01/21 1510       PT LONG TERM GOAL #1   Title The patient will be independent in safe self progression of HEP and/or aquatic ex program    Time 8    Period Weeks    Status On-going      PT LONG TERM GOAL #2   Title The patient will have lumbo/pelvic/hip strength grossly 4-/5 needed to ambulate household distances without her cane    Time 8    Period Weeks    Status On-going      PT LONG TERM GOAL #3   Title The patient will report a 60% improvement in pain and function for driving, standing to cook and walking    Time 8    Status On-going      PT LONG TERM GOAL #4   Title FOTO functional outcome score improved from 38% to 53%    Baseline met on 05/01/21    Status Achieved      PT LONG TERM GOAL #5   Title Pt will be able to exercise consistently for at least 20 minutes 2-3  days/week due to improved function and energy levels    Time 8    Period Weeks  Status On-going                   Plan - 05/01/21 1505     Clinical Impression Statement The patient reports today is not a good day but realizes overall she is doing much better referencing her ability to run/trot into the pool building the other day and less reliance on her cane.  Her FOTO outcome score has improved significantly from 38% to 53%.  Functionally, she reports sweeping always sets off her  hip.  Discussed strategies including weight shifting side to side and hip hinging (rather than spinal rotation and flexion).  She reports aquatic PT has gone very well and she has adjusted her work schedule to be able to continue.  She plans on finding a group water class upon discharge.    Personal Factors and Comorbidities Comorbidity 1;Comorbidity 2;Comorbidity 3+;Time since onset of injury/illness/exacerbation    Comorbidities chronic history of LBP of 2 years; history of colorectal cancer and breast cancer; hip THR    Examination-Participation Restrictions Meal Prep;Cleaning;Community Activity;Shop;Driving    Rehab Potential Good    PT Frequency 2x / week    PT Duration 8 weeks    PT Treatment/Interventions ADLs/Self Care Home Management;Aquatic Therapy;Electrical Stimulation;Moist Heat;Traction;Functional mobility training;Therapeutic activities;Therapeutic exercise;Neuromuscular re-education;Manual techniques;Patient/family education;Dry needling;Taping    PT Next Visit Plan aquatic PT;  low to moderate intensity seated and standing ex    PT Home Exercise Plan [id number]             Patient will benefit from skilled therapeutic intervention in order to improve the following deficits and impairments:  Decreased range of motion, Decreased activity tolerance, Pain, Decreased strength, Decreased mobility  Visit Diagnosis: Chronic right-sided low back pain with left-sided sciatica  Muscle weakness (generalized)  Unspecified lack of coordination     Problem List Patient Active Problem List   Diagnosis Date Noted   Anal cancer ([place]) 06/13/2019   Hematuria, gross 03/11/2017   Status post total shoulder arthroplasty, right 09/12/2016   Arthritis of knee 02/27/2015   Primary osteoarthritis of right knee Valgus 02/24/2015   Neuropathy 09/07/2013   Breast cancer of upper-outer quadrant of left female breast ([place]) 08/09/2013   Breast cancer of upper-outer quadrant of right female  breast ([place]) 08/09/2013   Osteoarthritis of right hip 05/03/2013   Osteoarthritis of left hip 02/15/2013   Ruben Im, PT 05/01/21 3:24 PM Phone: [phone] Fax: [phone]  Alvera Singh 05/01/2021, 3:23 PM  [facility] [address], [place], [place], [address] Phone: [phone]   Fax:  [phone]  Name: Cynthia Velasquez MRN: [id number] Date of Birth: 1950/12/18

## 2021-05-04 ENCOUNTER — Other Ambulatory Visit: Payer: Self-pay

## 2021-05-04 ENCOUNTER — Encounter: Payer: Self-pay | Admitting: Physical Therapy

## 2021-05-04 ENCOUNTER — Ambulatory Visit: Payer: PPO | Admitting: Physical Therapy

## 2021-05-04 DIAGNOSIS — M5442 Lumbago with sciatica, left side: Secondary | ICD-10-CM

## 2021-05-04 DIAGNOSIS — G8929 Other chronic pain: Secondary | ICD-10-CM

## 2021-05-04 DIAGNOSIS — R279 Unspecified lack of coordination: Secondary | ICD-10-CM

## 2021-05-04 DIAGNOSIS — M6281 Muscle weakness (generalized): Secondary | ICD-10-CM | POA: Diagnosis not present

## 2021-05-04 NOTE — Therapy (Addendum)
[facility] [address], [place], [place], [address] Phone: [phone]   Fax:  [phone]  Physical Therapy Treatment/Discharge Summary   Patient Details  Name: Cynthia Velasquez MRN: [id number] Date of Birth: 05/03/1951 Referring Provider (PT): Eilene Ghazi NP   Encounter Date: 05/04/2021   PT End of Session - 05/04/21 1355     Visit Number 9    Number of Visits 10    Date for PT Re-Evaluation 05/15/21    Authorization Type Medicare 10th visit prog note    PT Start Time 1350    PT Stop Time 1430    PT Time Calculation (min) 40 min    Activity Tolerance Patient tolerated treatment well    Behavior During Therapy [facility] for tasks assessed/performed             Past Medical History:  Diagnosis Date   Anal cancer ([place]) dx'd 05/2019   Anxiety    Arthritis    Bilateral breast cancer ([place]) 10/21/2013   Breast cancer ([place])    Cancer ([place])    Depression    Gallstones    GERD (gastroesophageal reflux disease)    doesn't take any meds for this   Headache    h/o migraines       History of bladder infections    History of hiatal hernia    History of migraine    Hyperlipidemia    takes Simvastatin daily   Hypertension    takes Hyzaar   Joint pain    Joint swelling    Lumbar stenosis    Neuropathy 09/07/2013   Pneumonia    Pre-diabetes     Past Surgical History:  Procedure Laterality Date   ABDOMINAL HYSTERECTOMY     bone spur removed from left foot     CHOLECYSTECTOMY     COLONOSCOPY     double mastectomy      HERNIA REPAIR     umbilical   right knee arthroscopy     right shoulder arthroscopy     surgery for hiatal hernia     TONSILLECTOMY     TOTAL HIP ARTHROPLASTY Left 02/12/2013   TOTAL HIP ARTHROPLASTY Left 02/12/2013   Procedure: LEFT TOTAL HIP ARTHROPLASTY;  Surgeon: Kerin Salen, MD;  Location: [place];  Service: Orthopedics;  Laterality: Left;   TOTAL HIP ARTHROPLASTY Right 05/03/2013    Procedure: TOTAL HIP ARTHROPLASTY;  Surgeon: Kerin Salen, MD;  Location: [place];  Service: Orthopedics;  Laterality: Right;   TOTAL KNEE ARTHROPLASTY Right 02/27/2015   Procedure: TOTAL KNEE ARTHROPLASTY;  Surgeon: Frederik Pear, MD;  Location: [place];  Service: Orthopedics;  Laterality: Right;   TOTAL SHOULDER ARTHROPLASTY Right 09/12/2016   Procedure: RIGHT TOTAL SHOULDER ARTHROPLASTY;  Surgeon: Tania Ade, MD;  Location: [place];  Service: Orthopedics;  Laterality: Right;  RIGHT TOTAL SHOULDER ARTHROPLASTY    There were no vitals filed for this visit.   Subjective Assessment - 05/04/21 1610     Subjective No complaints today, denies pain. No AD used.    Currently in Pain? No/denies             Treatment: Patient seen for aquatic therapy today.  Treatment took place in water 2.5-4 feet deep depending upon activity.  Pt entered the pool via stairs, reciprocally both in & out of the pool.  Water temp 92 degrees F.  Seated water bench with 75% submersion Pt performed seated LE AROM exercises 20x in all planes,  Standing in mid thoracic depth for the following: Water walking with yellow noodle 10x each direction, VC to continue to work on increasing her speed.  Wall exercises: 15x Bil for: heel raises, hip 3 ways, mini squats, circumduction,  abdominal compressions with noodle (2x6) pt required minimal touching of the pool wall.   Hamstring stretching Bil 2x 30 sec with ankle DF/PF 10x each                             PT Short Term Goals - 04/24/21 1530       PT SHORT TERM GOAL #1   Title ind with initial HEP    Status Achieved      PT SHORT TERM GOAL #2   Title pt will report 30% improvement in LBP with rising, standing and walking    Status Achieved               PT Long Term Goals - 05/01/21 1510       PT LONG TERM GOAL #1   Title The patient will be independent in safe self progression of HEP and/or aquatic ex program    Time 8     Period Weeks    Status On-going      PT LONG TERM GOAL #2   Title The patient will have lumbo/pelvic/hip strength grossly 4-/5 needed to ambulate household distances without her cane    Time 8    Period Weeks    Status On-going      PT LONG TERM GOAL #3   Title The patient will report a 60% improvement in pain and function for driving, standing to cook and walking    Time 8    Status On-going      PT LONG TERM GOAL #4   Title FOTO functional outcome score improved from 38% to 53%    Baseline met on 05/01/21    Status Achieved      PT LONG TERM GOAL #5   Title Pt will be able to exercise consistently for at least 20 minutes 2-3  days/week due to improved function and energy levels    Time 8    Period Weeks    Status On-going                PHYSICAL THERAPY DISCHARGE SUMMARY  Visits from Start of Care: 9  Current functional level related to goals / functional outcomes: The patient did not attend last  scheduled aquatic session.  When contacted, she states she is planning on continuing at the [facility] with their gentle exercise classes.  Will discharge from PT at this time.     Remaining deficits: As above   Education / Equipment: HEP   Patient agrees to discharge. Patient goals were partially met. Patient is being discharged due to the patient's request.     Patient will benefit from skilled therapeutic intervention in order to improve the following deficits and impairments:     Visit Diagnosis: Chronic right-sided low back pain with left-sided sciatica  Muscle weakness (generalized)  Unspecified lack of coordination     Problem List Patient Active Problem List   Diagnosis Date Noted   Anal cancer ([place]) 06/13/2019   Hematuria, gross 03/11/2017   Status post total shoulder arthroplasty, right 09/12/2016   Arthritis of knee 02/27/2015   Primary osteoarthritis of right knee Valgus 02/24/2015   Neuropathy 09/07/2013   Breast cancer of upper-outer quadrant  of left female breast ([place]) 08/09/2013   Breast cancer of upper-outer quadrant of right female breast ([place]) 08/09/2013   Osteoarthritis of right hip 05/03/2013   Osteoarthritis of left hip 02/15/2013   Ruben Im, PT 05/18/21 9:15 AM Phone: [phone] Fax: [phone]  Reita Shindler, PTA 05/04/2021, 4:14 PM  [facility] [address], [place], [place], [address] Phone: [phone]   Fax:  [phone]  Name: Cynthia Velasquez MRN: [id number] Date of Birth: 1951/02/02

## 2021-05-11 ENCOUNTER — Ambulatory Visit: Payer: PPO | Admitting: Physical Therapy

## 2021-07-02 DIAGNOSIS — K219 Gastro-esophageal reflux disease without esophagitis: Secondary | ICD-10-CM | POA: Diagnosis not present

## 2021-07-02 DIAGNOSIS — K625 Hemorrhage of anus and rectum: Secondary | ICD-10-CM | POA: Diagnosis not present

## 2021-07-02 DIAGNOSIS — R633 Feeding difficulties, unspecified: Secondary | ICD-10-CM | POA: Diagnosis not present

## 2021-07-02 DIAGNOSIS — C210 Malignant neoplasm of anus, unspecified: Secondary | ICD-10-CM | POA: Diagnosis not present

## 2021-07-09 ENCOUNTER — Other Ambulatory Visit: Payer: Self-pay

## 2021-07-09 ENCOUNTER — Ambulatory Visit (HOSPITAL_COMMUNITY)
Admission: RE | Admit: 2021-07-09 | Discharge: 2021-07-09 | Disposition: A | Discharge status: Home / Self Care | Payer: PPO | Source: Ambulatory Visit | Attending: Hematology | Admitting: Hematology

## 2021-07-09 ENCOUNTER — Encounter (HOSPITAL_COMMUNITY): Payer: Self-pay

## 2021-07-09 DIAGNOSIS — C210 Malignant neoplasm of anus, unspecified: Secondary | ICD-10-CM | POA: Diagnosis not present

## 2021-07-09 DIAGNOSIS — K439 Ventral hernia without obstruction or gangrene: Secondary | ICD-10-CM | POA: Diagnosis not present

## 2021-07-09 DIAGNOSIS — Z853 Personal history of malignant neoplasm of breast: Secondary | ICD-10-CM | POA: Diagnosis not present

## 2021-07-09 DIAGNOSIS — Z85048 Personal history of other malignant neoplasm of rectum, rectosigmoid junction, and anus: Secondary | ICD-10-CM | POA: Diagnosis not present

## 2021-07-09 DIAGNOSIS — I700 Atherosclerosis of aorta: Secondary | ICD-10-CM | POA: Diagnosis not present

## 2021-07-09 DIAGNOSIS — K449 Diaphragmatic hernia without obstruction or gangrene: Secondary | ICD-10-CM | POA: Diagnosis not present

## 2021-07-09 LAB — POCT I-STAT CREATININE: Creatinine, Ser: 0.9 mg/dL (ref 0.44–1.00)

## 2021-07-09 MED ORDER — IOHEXOL 350 MG/ML SOLN
75.0000 mL | Freq: Once | INTRAVENOUS | Status: AC | PRN
  Administered 2021-07-09: 75 mL via INTRAVENOUS

## 2021-07-11 ENCOUNTER — Telehealth: Payer: Self-pay

## 2021-07-11 NOTE — Telephone Encounter (Signed)
This nurse reached out to patient and made aware of CT results per MD.  Patient acknowledged understanding.  Patient informed this nurse that she needs to reschedule her lab and office visit appointments due to work obligation.   This nurse advised that scheduling will be notified and they will reach out to her to reschedule for a better day and time.  No further questions or concerns.

## 2021-07-11 NOTE — Telephone Encounter (Signed)
-----   Message from Truitt Merle, MD sent at 07/10/2021 10:40 PM EDT ----- Please let pt know her CT was negative for cancer recurrence. OK to move her lab to same day of her next OV, thanks   Truitt Merle  07/10/2021

## 2021-07-19 ENCOUNTER — Encounter: Payer: Self-pay | Admitting: Hematology

## 2021-07-19 ENCOUNTER — Other Ambulatory Visit: Payer: Self-pay

## 2021-07-19 ENCOUNTER — Telehealth: Payer: Self-pay

## 2021-07-19 ENCOUNTER — Other Ambulatory Visit: Payer: Self-pay | Admitting: *Deleted

## 2021-07-19 DIAGNOSIS — C210 Malignant neoplasm of anus, unspecified: Secondary | ICD-10-CM

## 2021-07-19 NOTE — Telephone Encounter (Signed)
Spoke w/pt via telephone regarding message sent to [name].  Pt said she's bleeding constantly especially at night and when sitting for a long period of time.  Pt said she goes through 2 to 4 heavy flow sanitary napkins a day.  Pt denied menstrual blood since she's post hysterectomy.  Pt stated she's having uncontrollable bowel movements.  Having pt come in to [facility] for labs and further assessment.  Notified [name] and Gilman Buttner, PA-C of pt's symptoms.

## 2021-07-20 ENCOUNTER — Other Ambulatory Visit: Payer: Self-pay

## 2021-07-20 ENCOUNTER — Inpatient Hospital Stay (HOSPITAL_BASED_OUTPATIENT_CLINIC_OR_DEPARTMENT_OTHER): Payer: PPO | Admitting: Hematology

## 2021-07-20 ENCOUNTER — Inpatient Hospital Stay: Payer: PPO | Attending: Hematology

## 2021-07-20 ENCOUNTER — Inpatient Hospital Stay: Payer: PPO

## 2021-07-20 DIAGNOSIS — Z923 Personal history of irradiation: Secondary | ICD-10-CM | POA: Insufficient documentation

## 2021-07-20 DIAGNOSIS — I10 Essential (primary) hypertension: Secondary | ICD-10-CM | POA: Insufficient documentation

## 2021-07-20 DIAGNOSIS — K625 Hemorrhage of anus and rectum: Secondary | ICD-10-CM | POA: Insufficient documentation

## 2021-07-20 DIAGNOSIS — C210 Malignant neoplasm of anus, unspecified: Secondary | ICD-10-CM | POA: Diagnosis not present

## 2021-07-20 DIAGNOSIS — N939 Abnormal uterine and vaginal bleeding, unspecified: Secondary | ICD-10-CM | POA: Insufficient documentation

## 2021-07-20 DIAGNOSIS — Z853 Personal history of malignant neoplasm of breast: Secondary | ICD-10-CM | POA: Insufficient documentation

## 2021-07-20 DIAGNOSIS — Z9013 Acquired absence of bilateral breasts and nipples: Secondary | ICD-10-CM | POA: Insufficient documentation

## 2021-07-20 DIAGNOSIS — Z9221 Personal history of antineoplastic chemotherapy: Secondary | ICD-10-CM | POA: Insufficient documentation

## 2021-07-20 DIAGNOSIS — R140 Abdominal distension (gaseous): Secondary | ICD-10-CM | POA: Diagnosis not present

## 2021-07-20 LAB — CBC WITH DIFFERENTIAL (CANCER CENTER ONLY)
Abs Immature Granulocytes: 0.01 10*3/uL (ref 0.00–0.07)
Basophils Absolute: 0 10*3/uL (ref 0.0–0.1)
Basophils Relative: 0 %
Eosinophils Absolute: 0.1 10*3/uL (ref 0.0–0.5)
Eosinophils Relative: 3 %
HCT: 39.4 % (ref 36.0–46.0)
Hemoglobin: 12.6 g/dL (ref 12.0–15.0)
Immature Granulocytes: 0 %
Lymphocytes Relative: 29 %
Lymphs Abs: 1.2 10*3/uL (ref 0.7–4.0)
MCH: 27 pg (ref 26.0–34.0)
MCHC: 32 g/dL (ref 30.0–36.0)
MCV: 84.4 fL (ref 80.0–100.0)
Monocytes Absolute: 0.4 10*3/uL (ref 0.1–1.0)
Monocytes Relative: 10 %
Neutro Abs: 2.3 10*3/uL (ref 1.7–7.7)
Neutrophils Relative %: 58 %
Platelet Count: 218 10*3/uL (ref 150–400)
RBC: 4.67 MIL/uL (ref 3.87–5.11)
RDW: 14.3 % (ref 11.5–15.5)
WBC Count: 4 10*3/uL (ref 4.0–10.5)
nRBC: 0 % (ref 0.0–0.2)

## 2021-07-20 LAB — IRON AND TIBC
Iron: 59 ug/dL (ref 41–142)
Saturation Ratios: 20 % — ABNORMAL LOW (ref 21–57)
TIBC: 291 ug/dL (ref 236–444)
UIBC: 232 ug/dL (ref 120–384)

## 2021-07-20 LAB — SAMPLE TO BLOOD BANK

## 2021-07-20 LAB — CMP (CANCER CENTER ONLY)
ALT: 11 U/L (ref 0–44)
AST: 15 U/L (ref 15–41)
Albumin: 3.4 g/dL — ABNORMAL LOW (ref 3.5–5.0)
Alkaline Phosphatase: 93 U/L (ref 38–126)
Anion gap: 7 (ref 5–15)
BUN: 16 mg/dL (ref 8–23)
CO2: 24 mmol/L (ref 22–32)
Calcium: 9 mg/dL (ref 8.9–10.3)
Chloride: 110 mmol/L (ref 98–111)
Creatinine: 0.86 mg/dL (ref 0.44–1.00)
GFR, Estimated: >60 mL/min
Glucose, Bld: 103 mg/dL — ABNORMAL HIGH (ref 70–99)
Potassium: 3.9 mmol/L (ref 3.5–5.1)
Sodium: 141 mmol/L (ref 135–145)
Total Bilirubin: 0.4 mg/dL (ref 0.3–1.2)
Total Protein: 6.9 g/dL (ref 6.5–8.1)

## 2021-07-20 LAB — MAGNESIUM: Magnesium: 1.7 mg/dL (ref 1.7–2.4)

## 2021-07-20 LAB — FERRITIN: Ferritin: 31 ng/mL (ref 11–307)

## 2021-07-20 NOTE — Progress Notes (Signed)
Cynthia Velasquez   [phone] [phone]   Clinic Follow up Note   Patient Care Team: Kathyrn Lass, MD as PCP - General (Family Medicine) Wonda Horner, MD as Consulting Physician (Gastroenterology) Truitt Merle, MD as Consulting Physician (Hematology) Alla Feeling, NP as Nurse Practitioner (Nurse Practitioner) Kyung Rudd, MD as Consulting Physician (Radiation Oncology) Michael Boston, MD as Consulting Physician (General Surgery)  Date of Service:  07/20/2021  CHIEF COMPLAINT: vaginal bleeding   CURRENT THERAPY:  Surveillance  ASSESSMENT & PLAN:  Cynthia Velasquez is a 70 y.o. female with   1. Rectal/Vaginal Bleeding, bloating -she reports daily vaginal bleeding with clots for the past week, s/p hysterectomy -she also reports mild rectal bleeding for several days, but this resolved on its own. -Her last colonoscopy was 2021 before Dr Penelope Coop retired.  -she saw Dr. Watt Climes on 07/02/21. CT 07/09/21 was NED. -she feels this is a hemorrhoid issue. I explained hemorrhoid bleeding is different than vaginal bleeding. She declined rectal exam today.her recent CT scan showed NED which is reassuring  -I recommend she follow up with Dr. Watt Climes and her GYN.  2. Anal Squamous Cell Carcinoma, cT2N0M0 -She was diagnosed in 05/2019. Workup showed a 3 cm mass at the anorectal junction, biopsy confirmed well to moderately differentiated squamous cell carcinoma. 06/21/19 PET scan showed no metastasis.  -She completed standard treatment with concurrent chemoRT with Mitomycin and 5FU on 08/09/19.  -PET scan on 11/01/19 showed near complete response.   -most recent surveillance CT CAP 07/09/21 was NED. I personally reviewed and discussed with her  -lab and f/u in 3 months   3. H/o bilateral breast cancer, Genetic negative  -2007: right breast biopsy showed atypical hyperplasia, s/p right lumpectomy in 06/2006 ER/PR+/HER2-, grade 1. Completed tamoxifen from 09/2007 - 09/2012 2008:  left breast invasive ductal carcinoma, triple negative, grade 3 s/p lumpectomy in 07/2007 and eventual bilateral mastectomy; s/p adjuvant chemo AC-T and radiation  -Per pt her Genetics Testing was negative. She was adopted, no known family history     4. HTN -on losartan. Continue to f/u with PCP   5. Back pain  -She notes for the past 2 months having sacral back pain that radiates and is exacerbated by sitting or standing for too long.  -Her 02/05/21 lumbar spine MRI was negative for metastatic disease     PLAN: -f/u with Dr. Watt Climes and GYN for bleeding concerns -lab and f/u in 3 months   No problem-specific Assessment & Plan notes found for this encounter.   SUMMARY OF ONCOLOGIC HISTORY: Oncology History Overview Note  Breast cancer of upper-outer quadrant of right female breast, (ER/PR positive, her2/Neu negative)   Primary site: Breast (Right)   Staging method: AJCC 7th Edition   Clinical: Stage IA (T1a, N0, cM0) signed by [name], MD on 09/07/2013 11:14 AM   Pathologic: Stage IA (T1a, N0, cM0) signed by [name], MD on 09/07/2013 11:14 AM   Summary: Stage IA (T1a, N0, cM0)  Breast cancer of upper-outer quadrant of left female breast (ER/PR/Her2Neu negative)   Primary site: Breast (Left)   Staging method: AJCC 7th Edition   Clinical: Stage IIA (T2, N0, cM0) signed by [name], MD on 09/07/2013 11:35 AM   Pathologic: Stage IIA (T2, N0, cM0) signed by [name], MD on 09/07/2013 11:35 AM   Summary: Stage IIA (T2, N0, cM0)     Breast cancer of upper-outer quadrant of left female breast ([place])  07/10/2007 Procedure  US biopsy showed invasive ductal carcinoma 0.8cm, Nottingham grade 3   07/30/2007 Surgery   She had left breast lumpectomy and LN biopsy which showed high grade invasive ductal cancer Nottingham grade 3, 2.7 cm, ER/PR/Her2 neu negative   11/24/2007 Surgery   Patient elected for bilateral mastectomy   09/07/2008 - 03/08/2009 Chemotherapy   dates are  approximate: she received adriamycin and cytoxan followed by Taxol   03/08/2009 - 05/08/2009 Radiation Therapy   dates are approximate, she received XRT    Breast cancer of upper-outer quadrant of right female breast ([place])  07/10/2006 Procedure   stereotactic biopsy showed atypical hyperplasia   10/23/2006 Surgery   right lumpectomy showed invasive ductal ca (Nottingham grade 1)  93m and DCIS, ER/PR positive Her 2 negative   10/09/2007 - 10/08/2012 Chemotherapy   Patient was placed on Tamoxifen   03/24/2017 Imaging   No acute findings. No evidence of recurrent carcinoma or metastatic disease   Anal cancer ([place]  06/04/2019 Procedure   Colonoscopy per Dr. SAnson Fret Findings-the digital rectal exam revealed a 3 cm diameter firm rectal mass.  The mass was noncircumferential and located predominantly at the right bowel wall at the anorectal junction. A nonobstructing mass was found at the anus and in the rectum    06/04/2019 Initial Biopsy   Follow pathology: Large intestine, rectum biopsy: Invasive well to moderately differentiated squamous cell carcinoma.  No rectal mucosa present.  There is strong diffuse expression of P 16 immunostain.  CDX 2, p63 and mCEA immunostains are also used in the diagnostic work-up of the case.   06/04/2019 Initial Diagnosis   Anal cancer ([place]   06/21/2019 PET scan   IMPRESSION: 1. Anorectal primary. No hypermetabolic metastatic disease within the chest, abdomen, or pelvis. Perirectal nodes, at least 1 of which is new since 02/26/2018 CT, suspicious based on size and interval development. 2. Mild limitations secondary to beam hardening artifact from bilateral hip arthroplasty. 3.  Aortic Atherosclerosis (ICD10-I70.0).   06/28/2019 Cancer Staging   Staging form: Anus, AJCC 8th Edition - Clinical stage from 06/28/2019: Stage IIA (cT2, cN0, cM0) - Signed by BAlla Feeling NP on 06/28/2019    06/28/2019 - 07/26/2019 Chemotherapy   concurrent chemoRT with  Mitomycin and 5FU on week 1 and week 5 on starting 06/28/19. Last dose on 07/26/19   06/28/2019 - 08/09/2019 Radiation Therapy   concurrent chemoRT with Dr MLisbeth Renshaw10/12/20-11/23/20   11/01/2019 PET scan   IMPRESSION: 1. Marked interval decrease in hypermetabolism noted at the level of the anal rectal primary. No evidence for hypermetabolic metastatic disease in the chest, abdomen, or pelvis. The perirectal lymph nodes identified previously have resolved in the interval. 2.  Aortic Atherosclerois (ICD10-170.0)   08/07/2020 Imaging   CT CAP  IMPRESSION: 1. No findings for residual/recurrent anal tumor, regional lymphadenopathy or metastatic disease. 2. Status post cholecystectomy. No biliary dilatation. 3. Small anterior abdominal wall hernia containing fat. 4. Aortic atherosclerosis.   Aortic Atherosclerosis (ICD10-I70.0).        INTERVAL HISTORY:  Cynthia Drumwrightis here for a follow up of anal cancer. She was last seen by me on 04/27/21. She presents to the clinic alone. She requested to be seen today for new bleeding. She reports she is having vaginal bleeding (recall she is s/p hysterectomy) daily for the last week and passing clots. She notes this happens more at night. She notes she did have some rectal bleeding, but that lasted only a few days and has resolved.  She also reports feeling more bloated lately. She also reports feeling food getting caught in her chest. She states she tried to drink water to clear it, but the water could not get past the food blockage and came back up. She notes the food did not come back up for a while later.   All other systems were reviewed with the patient and are negative.  MEDICAL HISTORY:  Past Medical History:  Diagnosis Date   Anal cancer ([place]) dx'd 05/2019   Anxiety    Arthritis    Bilateral breast cancer ([place]) 10/21/2013   Breast cancer ([place])    Cancer ([place])    Depression    Gallstones    GERD (gastroesophageal reflux disease)     doesn't take any meds for this   Headache    h/o migraines       History of bladder infections    History of hiatal hernia    History of migraine    Hyperlipidemia    takes Simvastatin daily   Hypertension    takes Hyzaar   Joint pain    Joint swelling    Lumbar stenosis    Neuropathy 09/07/2013   Pneumonia    Pre-diabetes     SURGICAL HISTORY: Past Surgical History:  Procedure Laterality Date   ABDOMINAL HYSTERECTOMY     bone spur removed from left foot     CHOLECYSTECTOMY     COLONOSCOPY     double mastectomy      HERNIA REPAIR     umbilical   right knee arthroscopy     right shoulder arthroscopy     surgery for hiatal hernia     TONSILLECTOMY     TOTAL HIP ARTHROPLASTY Left 02/12/2013   TOTAL HIP ARTHROPLASTY Left 02/12/2013   Procedure: LEFT TOTAL HIP ARTHROPLASTY;  Surgeon: Kerin Salen, MD;  Location: [facility];  Service: Orthopedics;  Laterality: Left;   TOTAL HIP ARTHROPLASTY Right 05/03/2013   Procedure: TOTAL HIP ARTHROPLASTY;  Surgeon: Kerin Salen, MD;  Location: [facility];  Service: Orthopedics;  Laterality: Right;   TOTAL KNEE ARTHROPLASTY Right 02/27/2015   Procedure: TOTAL KNEE ARTHROPLASTY;  Surgeon: Frederik Pear, MD;  Location: [facility];  Service: Orthopedics;  Laterality: Right;   TOTAL SHOULDER ARTHROPLASTY Right 09/12/2016   Procedure: RIGHT TOTAL SHOULDER ARTHROPLASTY;  Surgeon: Tania Ade, MD;  Location: [facility];  Service: Orthopedics;  Laterality: Right;  RIGHT TOTAL SHOULDER ARTHROPLASTY    I have reviewed the social history and family history with the patient and they are unchanged from previous note.  ALLERGIES:  is allergic to oxycodone-acetaminophen, codeine, and vicodin [hydrocodone-acetaminophen].  MEDICATIONS:  Current Outpatient Medications  Medication Sig Dispense Refill   LINZESS 72 MCG capsule      losartan (COZAAR) 100 MG tablet TK 1 T PO D  6   ondansetron (ZOFRAN ODT) 4 MG disintegrating tablet Take 1 tablet (4 mg total) by mouth every 8  (eight) hours as needed for nausea or vomiting. (Patient not taking: Reported on 03/20/2021) 6 tablet 0   PROAIR HFA 108 (90 Base) MCG/ACT inhaler 2 inhalations every 4-6 hours as needed. (Patient taking differently: Inhale 2 puffs into the lungs every 6 (six) hours as needed for wheezing or shortness of breath.) 18 g 1   rosuvastatin (CRESTOR) 40 MG tablet Take 40 mg by mouth daily.     No current facility-administered medications for this visit.    PHYSICAL EXAMINATION: ECOG PERFORMANCE STATUS: 1 - Symptomatic but completely  ambulatory  There were no vitals filed for this visit. Wt Readings from Last 3 Encounters:  04/27/21 220 lb 8 oz (100 kg)  04/04/21 223 lb (101.2 kg)  01/22/21 222 lb 4.8 oz (100.8 kg)     GENERAL:alert, no distress and comfortable SKIN: skin color, texture, turgor are normal, no rashes or significant lesions EYES: normal, Conjunctiva are pink and non-injected, sclera clear  LUNGS: clear to auscultation and percussion with normal breathing effort HEART: regular rate & rhythm and no murmurs and no lower extremity edema ABDOMEN:abdomen soft, and normal bowel sounds, (+) tender to palpation Musculoskeletal:no cyanosis of digits and no clubbing  NEURO: alert & oriented x 3 with fluent speech, no focal motor/sensory deficits  LABORATORY DATA:  I have reviewed the data as listed CBC Latest Ref Rng & Units 07/20/2021 04/27/2021 01/22/2021  WBC 4.0 - 10.5 K/uL 4.0 4.1 4.3  Hemoglobin 12.0 - 15.0 g/dL 12.6 12.7 13.1  Hematocrit 36.0 - 46.0 % 39.4 39.5 41.4  Platelets 150 - 400 K/uL 218 186 172     CMP Latest Ref Rng & Units 07/09/2021 04/27/2021 01/22/2021  Glucose 70 - 99 mg/dL - 96 116(H)  BUN 8 - 23 mg/dL - 15 16  Creatinine 0.44 - 1.00 mg/dL 0.90 0.95 0.99  Sodium 135 - 145 mmol/L - 142 143  Potassium 3.5 - 5.1 mmol/L - 3.9 4.0  Chloride 98 - 111 mmol/L - 111 108  CO2 22 - 32 mmol/L - 25 26  Calcium 8.9 - 10.3 mg/dL - 9.0 9.2  Total Protein 6.5 - 8.1 g/dL - 6.7  7.0  Total Bilirubin 0.3 - 1.2 mg/dL - 0.5 0.4  Alkaline Phos 38 - 126 U/L - 100 98  AST 15 - 41 U/L - 17 15  ALT 0 - 44 U/L - 15 9      RADIOGRAPHIC STUDIES: I have personally reviewed the radiological images as listed and agreed with the findings in the report. No results found.    No orders of the defined types were placed in this encounter.  All questions were answered. The patient knows to call the clinic with any problems, questions or concerns. No barriers to learning was detected. The total time spent in the appointment was 30 minutes.     Truitt Merle, MD 07/20/2021   I, Wilburn Mylar, am acting as scribe for Truitt Merle, MD.   I have reviewed the above documentation for accuracy and completeness, and I agree with the above.

## 2021-07-21 ENCOUNTER — Encounter: Payer: Self-pay | Admitting: Hematology

## 2021-07-23 ENCOUNTER — Telehealth: Payer: Self-pay

## 2021-07-23 NOTE — Telephone Encounter (Signed)
Faxed Dr. Ernestina Penna last office note over to Dr. Jeryl Columbia at [facility] (Gastroenterology) [phone]).  Dr. Ernestina Penna request.

## 2021-07-24 ENCOUNTER — Other Ambulatory Visit: Payer: PPO

## 2021-07-26 ENCOUNTER — Ambulatory Visit: Payer: PPO | Admitting: Hematology

## 2021-07-27 ENCOUNTER — Ambulatory Visit: Payer: PPO | Admitting: Hematology

## 2021-07-28 ENCOUNTER — Encounter: Payer: Self-pay | Admitting: Hematology

## 2021-08-07 DIAGNOSIS — B349 Viral infection, unspecified: Secondary | ICD-10-CM | POA: Diagnosis not present

## 2021-08-07 DIAGNOSIS — Z20822 Contact with and (suspected) exposure to covid-19: Secondary | ICD-10-CM | POA: Diagnosis not present

## 2021-08-07 DIAGNOSIS — R051 Acute cough: Secondary | ICD-10-CM | POA: Diagnosis not present

## 2021-08-15 ENCOUNTER — Other Ambulatory Visit: Payer: Self-pay | Admitting: Gastroenterology

## 2021-08-15 DIAGNOSIS — R1310 Dysphagia, unspecified: Secondary | ICD-10-CM

## 2021-08-15 DIAGNOSIS — R1013 Epigastric pain: Secondary | ICD-10-CM

## 2021-08-15 DIAGNOSIS — K219 Gastro-esophageal reflux disease without esophagitis: Secondary | ICD-10-CM

## 2021-08-18 DIAGNOSIS — N939 Abnormal uterine and vaginal bleeding, unspecified: Secondary | ICD-10-CM | POA: Diagnosis not present

## 2021-08-22 ENCOUNTER — Ambulatory Visit
Admission: RE | Admit: 2021-08-22 | Discharge: 2021-08-22 | Disposition: A | Discharge status: Home / Self Care | Payer: PPO | Source: Ambulatory Visit | Attending: Gastroenterology | Admitting: Gastroenterology

## 2021-08-22 ENCOUNTER — Other Ambulatory Visit: Payer: PPO

## 2021-08-22 DIAGNOSIS — R1310 Dysphagia, unspecified: Secondary | ICD-10-CM | POA: Diagnosis not present

## 2021-08-22 DIAGNOSIS — R1013 Epigastric pain: Secondary | ICD-10-CM

## 2021-08-22 DIAGNOSIS — K449 Diaphragmatic hernia without obstruction or gangrene: Secondary | ICD-10-CM | POA: Diagnosis not present

## 2021-08-22 DIAGNOSIS — K219 Gastro-esophageal reflux disease without esophagitis: Secondary | ICD-10-CM | POA: Diagnosis not present

## 2021-08-30 DIAGNOSIS — F329 Major depressive disorder, single episode, unspecified: Secondary | ICD-10-CM | POA: Diagnosis not present

## 2021-08-30 DIAGNOSIS — F331 Major depressive disorder, recurrent, moderate: Secondary | ICD-10-CM | POA: Diagnosis not present

## 2021-08-30 DIAGNOSIS — E7800 Pure hypercholesterolemia, unspecified: Secondary | ICD-10-CM | POA: Diagnosis not present

## 2021-08-30 DIAGNOSIS — K219 Gastro-esophageal reflux disease without esophagitis: Secondary | ICD-10-CM | POA: Diagnosis not present

## 2021-08-30 DIAGNOSIS — M150 Primary generalized (osteo)arthritis: Secondary | ICD-10-CM | POA: Diagnosis not present

## 2021-08-30 DIAGNOSIS — I10 Essential (primary) hypertension: Secondary | ICD-10-CM | POA: Diagnosis not present

## 2021-10-10 ENCOUNTER — Other Ambulatory Visit: Payer: Self-pay | Admitting: Gastroenterology

## 2021-10-10 DIAGNOSIS — C210 Malignant neoplasm of anus, unspecified: Secondary | ICD-10-CM | POA: Diagnosis not present

## 2021-10-10 DIAGNOSIS — R1310 Dysphagia, unspecified: Secondary | ICD-10-CM | POA: Diagnosis not present

## 2021-10-10 DIAGNOSIS — K5901 Slow transit constipation: Secondary | ICD-10-CM | POA: Diagnosis not present

## 2021-10-10 DIAGNOSIS — K921 Melena: Secondary | ICD-10-CM | POA: Diagnosis not present

## 2021-10-18 ENCOUNTER — Other Ambulatory Visit: Payer: Self-pay

## 2021-10-18 DIAGNOSIS — Z170 Estrogen receptor positive status [ER+]: Secondary | ICD-10-CM

## 2021-10-18 DIAGNOSIS — C50412 Malignant neoplasm of upper-outer quadrant of left female breast: Secondary | ICD-10-CM

## 2021-10-19 ENCOUNTER — Inpatient Hospital Stay: Payer: PPO | Attending: Hematology | Admitting: Hematology

## 2021-10-19 ENCOUNTER — Inpatient Hospital Stay: Payer: PPO

## 2021-10-27 ENCOUNTER — Encounter (HOSPITAL_BASED_OUTPATIENT_CLINIC_OR_DEPARTMENT_OTHER): Payer: Self-pay | Admitting: Emergency Medicine

## 2021-10-27 ENCOUNTER — Other Ambulatory Visit: Payer: Self-pay

## 2021-10-27 ENCOUNTER — Emergency Department (HOSPITAL_BASED_OUTPATIENT_CLINIC_OR_DEPARTMENT_OTHER)
Admission: EM | Admit: 2021-10-27 | Discharge: 2021-10-27 | Disposition: A | Discharge status: Home / Self Care | Payer: PPO | Attending: Emergency Medicine | Admitting: Emergency Medicine

## 2021-10-27 ENCOUNTER — Emergency Department (HOSPITAL_BASED_OUTPATIENT_CLINIC_OR_DEPARTMENT_OTHER): Payer: PPO

## 2021-10-27 DIAGNOSIS — H5500 Unspecified nystagmus: Secondary | ICD-10-CM | POA: Insufficient documentation

## 2021-10-27 DIAGNOSIS — R110 Nausea: Secondary | ICD-10-CM | POA: Insufficient documentation

## 2021-10-27 DIAGNOSIS — R519 Headache, unspecified: Secondary | ICD-10-CM | POA: Insufficient documentation

## 2021-10-27 DIAGNOSIS — R42 Dizziness and giddiness: Secondary | ICD-10-CM | POA: Diagnosis not present

## 2021-10-27 LAB — URINALYSIS, ROUTINE W REFLEX MICROSCOPIC
Bilirubin Urine: NEGATIVE
Glucose, UA: NEGATIVE mg/dL
Ketones, ur: NEGATIVE mg/dL
Leukocytes,Ua: NEGATIVE
Nitrite: NEGATIVE
Protein, ur: NEGATIVE mg/dL
Specific Gravity, Urine: 1.015 (ref 1.005–1.030)
pH: 7 (ref 5.0–8.0)

## 2021-10-27 LAB — CBC WITH DIFFERENTIAL/PLATELET
Abs Immature Granulocytes: 0.01 10*3/uL (ref 0.00–0.07)
Basophils Absolute: 0 10*3/uL (ref 0.0–0.1)
Basophils Relative: 0 %
Eosinophils Absolute: 0.1 10*3/uL (ref 0.0–0.5)
Eosinophils Relative: 1 %
HCT: 44 % (ref 36.0–46.0)
Hemoglobin: 14 g/dL (ref 12.0–15.0)
Immature Granulocytes: 0 %
Lymphocytes Relative: 25 %
Lymphs Abs: 1.2 10*3/uL (ref 0.7–4.0)
MCH: 27.2 pg (ref 26.0–34.0)
MCHC: 31.8 g/dL (ref 30.0–36.0)
MCV: 85.4 fL (ref 80.0–100.0)
Monocytes Absolute: 0.4 10*3/uL (ref 0.1–1.0)
Monocytes Relative: 9 %
Neutro Abs: 3.2 10*3/uL (ref 1.7–7.7)
Neutrophils Relative %: 65 %
Platelets: 241 10*3/uL (ref 150–400)
RBC: 5.15 MIL/uL — ABNORMAL HIGH (ref 3.87–5.11)
RDW: 14.8 % (ref 11.5–15.5)
WBC: 4.9 10*3/uL (ref 4.0–10.5)
nRBC: 0 % (ref 0.0–0.2)

## 2021-10-27 LAB — URINALYSIS, MICROSCOPIC (REFLEX)

## 2021-10-27 LAB — BASIC METABOLIC PANEL
Anion gap: 5 (ref 5–15)
BUN: 12 mg/dL (ref 8–23)
CO2: 28 mmol/L (ref 22–32)
Calcium: 9.3 mg/dL (ref 8.9–10.3)
Chloride: 105 mmol/L (ref 98–111)
Creatinine, Ser: 0.83 mg/dL (ref 0.44–1.00)
GFR, Estimated: >60 mL/min (ref >60)
Glucose, Bld: 102 mg/dL — ABNORMAL HIGH (ref 70–99)
Potassium: 4 mmol/L (ref 3.5–5.1)
Sodium: 138 mmol/L (ref 135–145)

## 2021-10-27 LAB — TROPONIN I (HIGH SENSITIVITY): Troponin I (High Sensitivity): 3 ng/L (ref <18)

## 2021-10-27 LAB — SEDIMENTATION RATE: Sed Rate: 19 mm/hr (ref 0–22)

## 2021-10-27 MED ORDER — MECLIZINE HCL 25 MG PO TABS
25.0000 mg | ORAL_TABLET | Freq: Once | ORAL | Status: AC
  Administered 2021-10-27: 25 mg via ORAL
  Filled 2021-10-27: qty 1

## 2021-10-27 MED ORDER — METOCLOPRAMIDE HCL 5 MG/ML IJ SOLN
10.0000 mg | Freq: Once | INTRAMUSCULAR | Status: AC
  Administered 2021-10-27: 10 mg via INTRAVENOUS
  Filled 2021-10-27: qty 2

## 2021-10-27 MED ORDER — SODIUM CHLORIDE 0.9 % IV BOLUS
500.0000 mL | Freq: Once | INTRAVENOUS | Status: AC
  Administered 2021-10-27: 500 mL via INTRAVENOUS

## 2021-10-27 MED ORDER — ONDANSETRON HCL 4 MG/2ML IJ SOLN
4.0000 mg | Freq: Once | INTRAMUSCULAR | Status: AC
  Administered 2021-10-27: 4 mg via INTRAVENOUS
  Filled 2021-10-27: qty 2

## 2021-10-27 MED ORDER — MECLIZINE HCL 12.5 MG PO TABS: 12.5000 mg | ORAL_TABLET | Freq: Three times a day (TID) | ORAL | 0 refills | Status: DC | PRN

## 2021-10-27 MED ORDER — KETOROLAC TROMETHAMINE 15 MG/ML IJ SOLN
15.0000 mg | Freq: Once | INTRAMUSCULAR | Status: AC
Start: 2021-10-27 — End: 2021-10-27
  Administered 2021-10-27: 15 mg via INTRAVENOUS
  Filled 2021-10-27: qty 1

## 2021-10-27 MED ORDER — ACETAMINOPHEN 325 MG PO TABS
650.0000 mg | ORAL_TABLET | Freq: Once | ORAL | Status: AC
  Administered 2021-10-27: 650 mg via ORAL
  Filled 2021-10-27: qty 2

## 2021-10-27 NOTE — ED Triage Notes (Signed)
Pt sts she woke about 1 hr PTA w/ room spinning, + nausea; sts she fell to knees when she tried to get out of bed; trouble with balance; elevated BP at home; originally went to UC, but could not sit in a chair and the wait was long, so she left (daughter took her)

## 2021-10-27 NOTE — ED Notes (Signed)
Pt transported to radiology.

## 2021-10-27 NOTE — ED Provider Notes (Signed)
Patient's headache is improved after migraine cocktail.  Blood pressure is improved.  She has been up walking without evidence of ataxia and is requesting discharge home.  She does not appear to have any social determinants of affecting her discharge today.   Blanchie Dessert, MD 10/27/21 1615

## 2021-10-27 NOTE — Discharge Instructions (Signed)
You were seen in the emergency department for an episode of dizziness/room spinning.  Your lab work was unremarkable.  Your MRI did not show any evidence of stroke.  You were given some medications that improved your symptoms.  We are prescribing you more of this medication to use at home as needed.  Please keep well-hydrated.  Follow-up with your primary care doctor.  Return if any worsening or concerning symptoms.

## 2021-10-27 NOTE — ED Provider Notes (Signed)
[facility] EMERGENCY DEPARTMENT Provider Note   CSN: [id number] Arrival date & time: 10/27/21  1125     History  Chief Complaint  Patient presents with   Dizziness    Cynthia Velasquez is a 71 y.o. female.  Last known well was 6:30 AM.  She woke up a few hours ago and felt that everything was spinning.  Associated with nausea.  Caused her difficulty ambulating.  Associate with some vague left temporal soreness.  No blurry vision or double vision.  No numbness or weakness.  No prior history of same.  No recent illness or head injury.  The history is provided by the patient.  Dizziness Quality:  Room spinning Severity:  Severe Duration:  2 hours Timing:  Constant Progression:  Unchanged Chronicity:  New Context: eye movement and head movement   Relieved by:  Nothing Worsened by:  Movement and turning head Ineffective treatments:  Being still Associated symptoms: headaches and nausea   Associated symptoms: no chest pain, no diarrhea, no hearing loss, no shortness of breath, no syncope, no tinnitus, no vision changes, no vomiting and no weakness       Home Medications Prior to Admission medications   Medication Sig Start Date End Date Taking? Authorizing Provider  LINZESS 72 MCG capsule  06/04/19   [provider]  losartan (COZAAR) 100 MG tablet TK 1 T PO D 01/14/17   [provider]  ondansetron (ZOFRAN ODT) 4 MG disintegrating tablet Take 1 tablet (4 mg total) by mouth every 8 (eight) hours as needed for nausea or vomiting. Patient not taking: Reported on 03/20/2021 02/27/18   Doristine Devoid, PA-C  PROAIR HFA 108 [phone] Base) MCG/ACT inhaler 2 inhalations every 4-6 hours as needed. Patient taking differently: Inhale 2 puffs into the lungs every 6 (six) hours as needed for wheezing or shortness of breath. 02/05/17   Kozlow, Donnamarie Poag, MD  rosuvastatin (CRESTOR) 40 MG tablet Take 40 mg by mouth daily. 07/30/19   [provider]      Allergies     Oxycodone-acetaminophen, Codeine, and Vicodin [hydrocodone-acetaminophen]    Review of Systems   Review of Systems  Constitutional:  Negative for fever.  HENT:  Negative for hearing loss, sore throat and tinnitus.   Eyes:  Negative for visual disturbance.  Respiratory:  Negative for shortness of breath.   Cardiovascular:  Negative for chest pain and syncope.  Gastrointestinal:  Positive for nausea. Negative for abdominal pain, diarrhea and vomiting.  Genitourinary:  Negative for dysuria.  Musculoskeletal:  Negative for neck pain.  Skin:  Negative for rash.  Neurological:  Positive for dizziness and headaches. Negative for facial asymmetry, speech difficulty and weakness.   Physical Exam Updated Vital Signs BP (!) 165/96 (BP Location: Right Arm)    Pulse (!) 59    Temp 98.1 F (36.7 C) (Oral)    Resp 18    Ht 5\' 5"  (1.651 m)    Wt 99.8 kg    SpO2 99%    BMI 36.61 kg/m  Physical Exam Vitals and nursing note reviewed.  Constitutional:      General: She is not in acute distress.    Appearance: Normal appearance. She is well-developed.  HENT:     Head: Normocephalic and atraumatic.  Eyes:     Extraocular Movements: Extraocular movements intact.     Conjunctiva/sclera: Conjunctivae normal.     Pupils: Pupils are equal, round, and reactive to light.     Comments: She does  have some horizontal nystagmus with moving eyes to the right  Cardiovascular:     Rate and Rhythm: Normal rate and regular rhythm.     Heart sounds: No murmur heard. Pulmonary:     Effort: Pulmonary effort is normal. No respiratory distress.     Breath sounds: Normal breath sounds.  Abdominal:     Palpations: Abdomen is soft.     Tenderness: There is no abdominal tenderness.  Musculoskeletal:        General: No swelling.     Cervical back: Neck supple.  Skin:    General: Skin is warm and dry.     Capillary Refill: Capillary refill takes less than 2 seconds.  Neurological:     General: No focal deficit  present.     Mental Status: She is alert and oriented to person, place, and time.     Cranial Nerves: No cranial nerve deficit.     Sensory: No sensory deficit.     Motor: No weakness.     Coordination: Coordination normal.     Comments: She has normal upper and lower extremity strength.  No facial asymmetry or dysarthria.  Normal finger-to-nose although slow as does not like opening her eyes to do the test.  Psychiatric:        Mood and Affect: Mood normal.    ED Results / Procedures / Treatments   Labs (all labs ordered are listed, but only abnormal results are displayed) Labs Reviewed  BASIC METABOLIC PANEL - Abnormal; Notable for the following components:      Result Value   Glucose, Bld 102 (*)    All other components within normal limits  CBC WITH DIFFERENTIAL/PLATELET - Abnormal; Notable for the following components:   RBC 5.15 (*)    All other components within normal limits  URINALYSIS, ROUTINE W REFLEX MICROSCOPIC - Abnormal; Notable for the following components:   Hgb urine dipstick TRACE (*)    All other components within normal limits  URINALYSIS, MICROSCOPIC (REFLEX) - Abnormal; Notable for the following components:   Bacteria, UA RARE (*)    All other components within normal limits  SEDIMENTATION RATE  TROPONIN I (HIGH SENSITIVITY)    EKG EKG Interpretation  Date/Time:  Saturday October 27 2021 11:41:26 EST Ventricular Rate:  59 PR Interval:  176 QRS Duration: 84 QT Interval:  436 QTC Calculation: 431 R Axis:   12 Text Interpretation: Sinus bradycardia Minimal voltage criteria for LVH, may be normal variant ( R in aVL ) Borderline ECG When compared with ECG of 22-May-2018 08:27, No significant change since last tracing Confirmed by Aletta Edouard [phone]) on 10/27/2021 11:45:27 AM  Radiology MR BRAIN WO CONTRAST  Result Date: 10/27/2021 CLINICAL DATA:  Dizziness and nausea. EXAM: MRI HEAD WITHOUT CONTRAST TECHNIQUE: Multiplanar, multiecho pulse sequences  of the brain and surrounding structures were obtained without intravenous contrast. COMPARISON:  Head CT 04/04/2021 and MRI 05/22/2018 FINDINGS: Brain: There is no evidence of an acute infarct, intracranial hemorrhage, mass, midline shift, or extra-axial fluid collection. The ventricles and sulci are normal. No significant white matter disease is seen for age. No focal cerebellar insult is evident. Vascular: Major intracranial vascular flow voids are preserved. Skull and upper cervical spine: Unremarkable bone marrow signal. Sinuses/Orbits: Unremarkable orbits. Mild mucosal thickening in the paranasal sinuses. Small to moderate right mastoid effusion. Other: None. IMPRESSION: 1. Unremarkable appearance of the brain for age. 2. Right mastoid effusion. Electronically Signed   By: Logan Bores M.D.   On:  10/27/2021 15:12    Procedures Procedures    Medications Ordered in ED Medications  sodium chloride 0.9 % bolus 500 mL (0 mLs Intravenous Stopped 10/27/21 1434)  ondansetron (ZOFRAN) injection 4 mg (4 mg Intravenous Given 10/27/21 1321)  meclizine (ANTIVERT) tablet 25 mg (25 mg Oral Given 10/27/21 1329)  acetaminophen (TYLENOL) tablet 650 mg (650 mg Oral Given 10/27/21 1421)  metoCLOPramide (REGLAN) injection 10 mg (10 mg Intravenous Given 10/27/21 1538)  ketorolac (TORADOL) 15 MG/ML injection 15 mg (15 mg Intravenous Given 10/27/21 1538)    ED Course/ Medical Decision Making/ A&P Clinical Course as of 10/27/21 1754  Sat Oct 27, 2021  1419 Nurse states patient's vertigo was much better and is ambulating well.  She does have a headache.  Tylenol ordered. [MB]    Clinical Course User Index [MB] Hayden Rasmussen, MD                           Medical Decision Making Amount and/or Complexity of Data Reviewed Labs: ordered. Radiology: ordered.  Risk OTC drugs. Prescription drug management.  This patient complains of dizziness room spinning unsteady gait; this involves an extensive number of  treatment Options and is a complaint that carries with it a high risk of complications and Morbidity. The differential includes vertigo, stroke, bleed, metabolic derangement, anemia, temporal arteritis  I ordered, reviewed and interpreted labs, which included CBC with normal white count normal hemoglobin, chemistries normal, urinalysis without signs of infection, troponin low, sed rate normal I ordered medication IV fluids meclizine with improvement in her dizziness.  Also received Reglan Toradol for migraine cocktail for her headache I ordered imaging studies which included MRI brain and I independently    visualized and interpreted imaging which showed no acute signs of stroke Previous records obtained and reviewed in epic no recent admissions  After the interventions stated above, I reevaluated the patient and found patient symptoms to be improved.  She is still having headache and so have ordered migraine cocktail.  Care is signed out to oncoming provider Dr. Maryan Rued to follow-up on results of medication.  Likely will be able to be discharged and outpatient follow-up with her treating provider.  No indications for admission at this time.          Final Clinical Impression(s) / ED Diagnoses Final diagnoses:  Vertigo    Rx / DC Orders ED Discharge Orders          Ordered    meclizine (ANTIVERT) 12.5 MG tablet  3 times daily PRN        10/27/21 1506              Hayden Rasmussen, MD 10/27/21 1756

## 2021-11-07 ENCOUNTER — Telehealth: Payer: Self-pay | Admitting: Hematology

## 2021-11-07 NOTE — Telephone Encounter (Signed)
.  Called patient to schedule appointment per 2/21 inbasket, patient is aware of date and time.   °

## 2021-11-19 ENCOUNTER — Telehealth: Payer: Self-pay

## 2021-11-19 NOTE — Telephone Encounter (Signed)
Reviewed pt chart ?

## 2021-11-20 ENCOUNTER — Other Ambulatory Visit: Payer: Self-pay

## 2021-11-20 ENCOUNTER — Telehealth: Payer: Self-pay

## 2021-11-20 ENCOUNTER — Inpatient Hospital Stay: Payer: PPO | Attending: Hematology

## 2021-11-20 ENCOUNTER — Inpatient Hospital Stay (HOSPITAL_BASED_OUTPATIENT_CLINIC_OR_DEPARTMENT_OTHER): Payer: PPO | Admitting: Hematology

## 2021-11-20 ENCOUNTER — Encounter: Payer: Self-pay | Admitting: Hematology

## 2021-11-20 VITALS — BP 164/97 | HR 70 | Temp 98.3°F | Resp 16 | Ht 65.0 in | Wt 221.8 lb

## 2021-11-20 DIAGNOSIS — Z170 Estrogen receptor positive status [ER+]: Secondary | ICD-10-CM

## 2021-11-20 DIAGNOSIS — Z853 Personal history of malignant neoplasm of breast: Secondary | ICD-10-CM | POA: Insufficient documentation

## 2021-11-20 DIAGNOSIS — Z79899 Other long term (current) drug therapy: Secondary | ICD-10-CM | POA: Diagnosis not present

## 2021-11-20 DIAGNOSIS — C50412 Malignant neoplasm of upper-outer quadrant of left female breast: Secondary | ICD-10-CM

## 2021-11-20 DIAGNOSIS — R1010 Upper abdominal pain, unspecified: Secondary | ICD-10-CM | POA: Diagnosis not present

## 2021-11-20 DIAGNOSIS — K625 Hemorrhage of anus and rectum: Secondary | ICD-10-CM | POA: Diagnosis not present

## 2021-11-20 DIAGNOSIS — C210 Malignant neoplasm of anus, unspecified: Secondary | ICD-10-CM

## 2021-11-20 LAB — CBC WITH DIFFERENTIAL (CANCER CENTER ONLY)
Abs Immature Granulocytes: 0.01 10*3/uL (ref 0.00–0.07)
Basophils Absolute: 0 10*3/uL (ref 0.0–0.1)
Basophils Relative: 0 %
Eosinophils Absolute: 0.1 10*3/uL (ref 0.0–0.5)
Eosinophils Relative: 3 %
HCT: 39.4 % (ref 36.0–46.0)
Hemoglobin: 12.5 g/dL (ref 12.0–15.0)
Immature Granulocytes: 0 %
Lymphocytes Relative: 25 %
Lymphs Abs: 1.1 10*3/uL (ref 0.7–4.0)
MCH: 27.2 pg (ref 26.0–34.0)
MCHC: 31.7 g/dL (ref 30.0–36.0)
MCV: 85.7 fL (ref 80.0–100.0)
Monocytes Absolute: 0.4 10*3/uL (ref 0.1–1.0)
Monocytes Relative: 8 %
Neutro Abs: 2.8 10*3/uL (ref 1.7–7.7)
Neutrophils Relative %: 64 %
Platelet Count: 242 10*3/uL (ref 150–400)
RBC: 4.6 MIL/uL (ref 3.87–5.11)
RDW: 14.7 % (ref 11.5–15.5)
WBC Count: 4.5 10*3/uL (ref 4.0–10.5)
nRBC: 0 % (ref 0.0–0.2)

## 2021-11-20 LAB — CMP (CANCER CENTER ONLY)
ALT: 9 U/L (ref 0–44)
AST: 13 U/L — ABNORMAL LOW (ref 15–41)
Albumin: 3.8 g/dL (ref 3.5–5.0)
Alkaline Phosphatase: 84 U/L (ref 38–126)
Anion gap: 5 (ref 5–15)
BUN: 17 mg/dL (ref 8–23)
CO2: 28 mmol/L (ref 22–32)
Calcium: 9.4 mg/dL (ref 8.9–10.3)
Chloride: 109 mmol/L (ref 98–111)
Creatinine: 0.94 mg/dL (ref 0.44–1.00)
GFR, Estimated: >60 mL/min (ref >60)
Glucose, Bld: 95 mg/dL (ref 70–99)
Potassium: 4.2 mmol/L (ref 3.5–5.1)
Sodium: 142 mmol/L (ref 135–145)
Total Bilirubin: 0.3 mg/dL (ref 0.3–1.2)
Total Protein: 7.1 g/dL (ref 6.5–8.1)

## 2021-11-20 LAB — FERRITIN: Ferritin: 24 ng/mL (ref 11–307)

## 2021-11-20 LAB — IRON AND IRON BINDING CAPACITY (CC-WL,HP ONLY)
Iron: 38 ug/dL (ref 28–170)
Saturation Ratios: 12 % (ref 10.4–31.8)
TIBC: 318 ug/dL (ref 250–450)
UIBC: 280 ug/dL (ref 148–442)

## 2021-11-20 NOTE — Progress Notes (Signed)
[facility]   [phone] Fax:([phone]   Clinic Follow up Note   Patient Care Team: Kathyrn Lass, MD as PCP - General (Family Medicine) Wonda Horner, MD as Consulting Physician (Gastroenterology) Truitt Merle, MD as Consulting Physician (Hematology) Alla Feeling, NP as Nurse Practitioner (Nurse Practitioner) Kyung Rudd, MD as Consulting Physician (Radiation Oncology) Michael Boston, MD as Consulting Physician (General Surgery)  Date of Service:  11/20/2021  CHIEF COMPLAINT: f/u of anal cancer  CURRENT THERAPY:  Surveillance  ASSESSMENT & PLAN:  Cynthia Velasquez is a 71 y.o. female with   1. Rectal Bleeding -she reports continued rectal bleeding -Her last colonoscopy was 2021 before Dr Penelope Coop retired.  -CT 07/09/21 was NED. -she is scheduled for colonoscopy with Dr. Watt Climes on 12/07/21.  2. Upper Abdominal Pain -she reports pain to her upper abdomen/stomach area for the last 6 months. She notes it is different from acid reflux pain. -she had barium swallow study on 08/22/21 showing a small hiatal hernia. -she has not had a recent upper endoscopy. I will reach out to Dr. Watt Climes. -I will also order abdominal US   3. Anal Squamous Cell Carcinoma, cT2N0M0 -She was diagnosed in 05/2019. Workup showed a 3 cm mass at the anorectal junction, biopsy confirmed well to moderately differentiated squamous cell carcinoma. 06/21/19 PET scan showed no metastasis.  -She completed standard treatment with concurrent chemoRT with Mitomycin and 5FU on 08/09/19.  -PET scan on 11/01/19 showed near complete response.   -most recent surveillance CT CAP 07/09/21 was NED. -lab and f/u in 3 months   4. H/o bilateral breast cancer, Genetic negative  -2007: right breast biopsy showed atypical hyperplasia, s/p right lumpectomy in 06/2006 ER/PR+/HER2-, grade 1. Completed tamoxifen from 09/2007 - 09/2012 -2008: left breast invasive ductal carcinoma, triple negative, grade 3 s/p  lumpectomy in 07/2007 and eventual bilateral mastectomy; s/p adjuvant chemo AC-T and radiation  -Per pt her Genetics Testing was negative. She was adopted, no known family history     5. HTN -on losartan. Continue to f/u with PCP     PLAN: -abdominal US to be done soon -proceed with colonoscopy on 12/07/21  -I will reach out to Dr. Watt Climes about EGD -lab and f/u in 4 months   No problem-specific Assessment & Plan notes found for this encounter.   SUMMARY OF ONCOLOGIC HISTORY: Oncology History Overview Note  Cancer Staging Anal cancer ([place]) Staging form: Anus, AJCC 8th Edition - Clinical stage from 06/28/2019: Stage IIA (cT2, cN0, cM0) - Signed by Alla Feeling, NP on 06/28/2019  Breast cancer of upper-outer quadrant of left female breast [facility]) Staging form: Breast, AJCC 7th Edition - Clinical stage from 09/07/2013: Stage IIA (T2, N0, cM0) - Signed by [name], MD on 09/07/2013 - Pathologic: Stage IIA (T2, N0, cM0) - Signed by [name], MD on 09/07/2013  Breast cancer of upper-outer quadrant of right female breast [facility]) Staging form: Breast, AJCC 7th Edition - Clinical stage from 09/07/2013: Stage IA (T1a, N0, cM0) - Signed by [name], MD on 09/07/2013 - Pathologic: Stage IA (T1a, N0, cM0) - Signed by [name], MD on 09/07/2013    Breast cancer of upper-outer quadrant of left female breast ([place])  07/10/2007 Procedure   US biopsy showed invasive ductal carcinoma 0.8cm, Nottingham grade 3   07/30/2007 Surgery   She had left breast lumpectomy and LN biopsy which showed high grade invasive ductal cancer Nottingham grade 3, 2.7 cm, ER/PR/Her2 neu negative  11/24/2007 Surgery   Patient elected for bilateral mastectomy   09/07/2008 - 03/08/2009 Chemotherapy   dates are approximate: she received adriamycin and cytoxan followed by Taxol   03/08/2009 - 05/08/2009 Radiation Therapy   dates are approximate, she received XRT    07/09/2021 Imaging   CT  CAP  IMPRESSION: 1. No evidence to suggest locally recurrent ianorectal neoplasm or metastatic disease in the chest, abdomen or pelvis. 2. Status post bilateral modified radical mastectomy and bilateral axillary lymph node dissection. 3. Three small supraumbilical ventral hernias containing only omental fat. No associated bowel incarceration or obstruction at this time. 4. Aortic atherosclerosis. 5. Additional incidental findings, as above.   Breast cancer of upper-outer quadrant of right female breast ([place])  07/10/2006 Procedure   stereotactic biopsy showed atypical hyperplasia   10/23/2006 Surgery   right lumpectomy showed invasive ductal ca (Nottingham grade 1)  77m and DCIS, ER/PR positive Her 2 negative   10/09/2007 - 10/08/2012 Chemotherapy   Patient was placed on Tamoxifen   03/24/2017 Imaging   No acute findings. No evidence of recurrent carcinoma or metastatic disease   07/09/2021 Imaging   CT CAP  IMPRESSION: 1. No evidence to suggest locally recurrent ianorectal neoplasm or metastatic disease in the chest, abdomen or pelvis. 2. Status post bilateral modified radical mastectomy and bilateral axillary lymph node dissection. 3. Three small supraumbilical ventral hernias containing only omental fat. No associated bowel incarceration or obstruction at this time. 4. Aortic atherosclerosis. 5. Additional incidental findings, as above.   [place] cancer ([place]  06/04/2019 Procedure   Colonoscopy per Dr. SAnson Fret Findings-the digital rectal exam revealed a 3 cm diameter firm rectal mass.  The mass was noncircumferential and located predominantly at the right bowel wall at the anorectal junction. A nonobstructing mass was found at the anus and in the rectum    06/04/2019 Initial Biopsy   Follow pathology: Large intestine, rectum biopsy: Invasive well to moderately differentiated squamous cell carcinoma.  No rectal mucosa present.  There is strong diffuse expression of P 16  immunostain.  CDX 2, p63 and mCEA immunostains are also used in the diagnostic work-up of the case.   06/04/2019 Initial Diagnosis   [place] cancer ([place]   06/21/2019 PET scan   IMPRESSION: 1. Anorectal primary. No hypermetabolic metastatic disease within the chest, abdomen, or pelvis. Perirectal nodes, at least 1 of which is new since 02/26/2018 CT, suspicious based on size and interval development. 2. Mild limitations secondary to beam hardening artifact from bilateral hip arthroplasty. 3.  Aortic Atherosclerosis (ICD10-I70.0).   06/28/2019 Cancer Staging   Staging form: Anus, AJCC 8th Edition - Clinical stage from 06/28/2019: Stage IIA (cT2, cN0, cM0) - Signed by BAlla Feeling NP on 06/28/2019    06/28/2019 - 07/26/2019 Chemotherapy   concurrent chemoRT with Mitomycin and 5FU on week 1 and week 5 on starting 06/28/19. Last dose on 07/26/19   06/28/2019 - 08/09/2019 Radiation Therapy   concurrent chemoRT with Dr MLisbeth Renshaw10/12/20-11/23/20   11/01/2019 PET scan   IMPRESSION: 1. Marked interval decrease in hypermetabolism noted at the level of the anal rectal primary. No evidence for hypermetabolic metastatic disease in the chest, abdomen, or pelvis. The perirectal lymph nodes identified previously have resolved in the interval. 2.  Aortic Atherosclerois (ICD10-170.0)   08/07/2020 Imaging   CT CAP  IMPRESSION: 1. No findings for residual/recurrent anal tumor, regional lymphadenopathy or metastatic disease. 2. Status post cholecystectomy. No biliary dilatation. 3. Small anterior abdominal wall hernia containing fat.  4. Aortic atherosclerosis.   Aortic Atherosclerosis (ICD10-I70.0).     07/09/2021 Imaging   CT CAP  IMPRESSION: 1. No evidence to suggest locally recurrent ianorectal neoplasm or metastatic disease in the chest, abdomen or pelvis. 2. Status post bilateral modified radical mastectomy and bilateral axillary lymph node dissection. 3. Three small supraumbilical  ventral hernias containing only omental fat. No associated bowel incarceration or obstruction at this time. 4. Aortic atherosclerosis. 5. Additional incidental findings, as above.      INTERVAL HISTORY:  Cynthia Velasquez is here for a follow up of anal cancer. She was last seen by me on 07/20/21. She presents to the clinic alone. She reports she is still having rectal bleeding.  She also reports a pain to her upper mid abdomen, which she rates about a 5/10. She notes this has been going on for about 6 months. She reports this pain is different than acid reflux, which she also has.   All other systems were reviewed with the patient and are negative.  MEDICAL HISTORY:  Past Medical History:  Diagnosis Date   Anal cancer ([place]) dx'd 05/2019   Anxiety    Arthritis    Bilateral breast cancer ([place]) 10/21/2013   Breast cancer ([place])    Cancer ([place])    Depression    Gallstones    GERD (gastroesophageal reflux disease)    doesn't take any meds for this   Headache    h/o migraines       History of bladder infections    History of hiatal hernia    History of migraine    Hyperlipidemia    takes Simvastatin daily   Hypertension    takes Hyzaar   Joint pain    Joint swelling    Lumbar stenosis    Neuropathy 09/07/2013   Pneumonia    Pre-diabetes     SURGICAL HISTORY: Past Surgical History:  Procedure Laterality Date   ABDOMINAL HYSTERECTOMY     bone spur removed from left foot     CHOLECYSTECTOMY     COLONOSCOPY     double mastectomy      HERNIA REPAIR     umbilical   right knee arthroscopy     right shoulder arthroscopy     surgery for hiatal hernia     TONSILLECTOMY     TOTAL HIP ARTHROPLASTY Left 02/12/2013   TOTAL HIP ARTHROPLASTY Left 02/12/2013   Procedure: LEFT TOTAL HIP ARTHROPLASTY;  Surgeon: Kerin Salen, MD;  Location: [place];  Service: Orthopedics;  Laterality: Left;   TOTAL HIP ARTHROPLASTY Right 05/03/2013   Procedure: TOTAL HIP ARTHROPLASTY;  Surgeon: Kerin Salen, MD;  Location: [place];  Service: Orthopedics;  Laterality: Right;   TOTAL KNEE ARTHROPLASTY Right 02/27/2015   Procedure: TOTAL KNEE ARTHROPLASTY;  Surgeon: Frederik Pear, MD;  Location: [place];  Service: Orthopedics;  Laterality: Right;   TOTAL SHOULDER ARTHROPLASTY Right 09/12/2016   Procedure: RIGHT TOTAL SHOULDER ARTHROPLASTY;  Surgeon: Tania Ade, MD;  Location: [place];  Service: Orthopedics;  Laterality: Right;  RIGHT TOTAL SHOULDER ARTHROPLASTY    I have reviewed the social history and family history with the patient and they are unchanged from previous note.  ALLERGIES:  is allergic to oxycodone-acetaminophen, codeine, and vicodin [hydrocodone-acetaminophen].  MEDICATIONS:  Current Outpatient Medications  Medication Sig Dispense Refill   LINZESS 72 MCG capsule      losartan (COZAAR) 100 MG tablet TK 1 T PO D  6   meclizine (ANTIVERT) 12.5 MG  tablet Take 1 tablet (12.5 mg total) by mouth 3 (three) times daily as needed for dizziness. 30 tablet 0   ondansetron (ZOFRAN ODT) 4 MG disintegrating tablet Take 1 tablet (4 mg total) by mouth every 8 (eight) hours as needed for nausea or vomiting. (Patient not taking: Reported on 03/20/2021) 6 tablet 0   PROAIR HFA 108 (90 Base) MCG/ACT inhaler 2 inhalations every 4-6 hours as needed. (Patient taking differently: Inhale 2 puffs into the lungs every 6 (six) hours as needed for wheezing or shortness of breath.) 18 g 1   rosuvastatin (CRESTOR) 40 MG tablet Take 40 mg by mouth daily.     No current facility-administered medications for this visit.    PHYSICAL EXAMINATION: ECOG PERFORMANCE STATUS: 1 - Symptomatic but completely ambulatory  Vitals:   11/20/21 0851  BP: (!) 164/97  Pulse: 70  Resp: 16  Temp: 98.3 F (36.8 C)  SpO2: 99%   Wt Readings from Last 3 Encounters:  11/20/21 221 lb 12.8 oz (100.6 kg)  10/27/21 220 lb (99.8 kg)  07/20/21 221 lb 11.2 oz (100.6 kg)     GENERAL:alert, no distress and comfortable SKIN: skin  color, texture, turgor are normal, no rashes or significant lesions EYES: normal, Conjunctiva are pink and non-injected, sclera clear  NECK: supple, thyroid normal size, non-tender, without nodularity LYMPH:  no palpable lymphadenopathy in the cervical, axillary  LUNGS: clear to auscultation and percussion with normal breathing effort HEART: regular rate & rhythm and no murmurs and no lower extremity edema ABDOMEN:abdomen soft, mild diffuse tenderness in upper and mid abdomen, normal bowel sounds Musculoskeletal:no cyanosis of digits and no clubbing  NEURO: alert & oriented x 3 with fluent speech, no focal motor/sensory deficits BREAST: No palpable mass, nodules or adenopathy bilaterally. Breast exam benign.   LABORATORY DATA:  I have reviewed the data as listed CBC Latest Ref Rng & Units 11/20/2021 10/27/2021 07/20/2021  WBC 4.0 - 10.5 K/uL 4.5 4.9 4.0  Hemoglobin 12.0 - 15.0 g/dL 12.5 14.0 12.6  Hematocrit 36.0 - 46.0 % 39.4 44.0 39.4  Platelets 150 - 400 K/uL 242 241 218     CMP Latest Ref Rng & Units 11/20/2021 10/27/2021 07/20/2021  Glucose 70 - 99 mg/dL 95 102(H) 103(H)  BUN 8 - 23 mg/dL _0 Creatinine 0.44 - 1.00 mg/dL 0.94 0.83 0.86  Sodium 135 - 145 mmol/L 142 138 141  Potassium 3.5 - 5.1 mmol/L 4.2 4.0 3.9  Chloride 98 - 111 mmol/L 109 105 110  CO2 22 - 32 mmol/L _1 Calcium 8.9 - 10.3 mg/dL 9.4 9.3 9.0  Total Protein 6.5 - 8.1 g/dL 7.1 - 6.9  Total Bilirubin 0.3 - 1.2 mg/dL 0.3 - 0.4  Alkaline Phos 38 - 126 U/L 84 - 93  AST 15 - 41 U/L 13(L) - 15  ALT 0 - 44 U/L 9 - 11      RADIOGRAPHIC STUDIES: I have personally reviewed the radiological images as listed and agreed with the findings in the report. No results found.    Orders Placed This Encounter  Procedures   US Abdomen Complete    Standing Status:   Future    Standing Expiration Date:   11/20/2022    Order Specific Question:   Reason for Exam (SYMPTOM  OR DIAGNOSIS REQUIRED)    Answer:   epigastric  and RUQ pain for severeal months    Order Specific Question:   Preferred imaging location?    Answer:  [facility]   All questions were answered. The patient knows to call the clinic with any problems, questions or concerns. No barriers to learning was detected. The total time spent in the appointment was 30 minutes.     Truitt Merle, MD 11/20/2021   I, Wilburn Mylar, am acting as scribe for Truitt Merle, MD.   I have reviewed the above documentation for accuracy and completeness, and I agree with the above.

## 2021-11-20 NOTE — Telephone Encounter (Signed)
Faxed [name] last office not to Dr. Clarene Essex at [facility] [phone]).  Pt is scheduled to have Colonoscopy on 12/07/2021.  Fax confirmation received. ?

## 2021-11-29 ENCOUNTER — Other Ambulatory Visit: Payer: Self-pay

## 2021-11-29 ENCOUNTER — Ambulatory Visit (HOSPITAL_COMMUNITY)
Admission: RE | Admit: 2021-11-29 | Discharge: 2021-11-29 | Disposition: A | Discharge status: Home / Self Care | Payer: PPO | Source: Ambulatory Visit | Attending: Hematology | Admitting: Hematology

## 2021-11-29 ENCOUNTER — Encounter (HOSPITAL_COMMUNITY): Payer: Self-pay | Admitting: Gastroenterology

## 2021-11-29 DIAGNOSIS — Z9049 Acquired absence of other specified parts of digestive tract: Secondary | ICD-10-CM | POA: Diagnosis not present

## 2021-11-29 DIAGNOSIS — C210 Malignant neoplasm of anus, unspecified: Secondary | ICD-10-CM | POA: Insufficient documentation

## 2021-11-29 DIAGNOSIS — R1011 Right upper quadrant pain: Secondary | ICD-10-CM | POA: Diagnosis not present

## 2021-11-29 DIAGNOSIS — R1013 Epigastric pain: Secondary | ICD-10-CM | POA: Diagnosis not present

## 2021-11-29 NOTE — Progress Notes (Signed)
Attempted to obtain medical history via telephone, unable to reach at this time. I left a voicemail to return pre surgical testing department's phone call.  

## 2021-12-01 ENCOUNTER — Encounter: Payer: Self-pay | Admitting: Hematology

## 2021-12-05 ENCOUNTER — Telehealth: Payer: Self-pay

## 2021-12-05 NOTE — Telephone Encounter (Signed)
This nurse reached out to patient and made aware of results for [name] of Abdomen per provider.  Patient acknowledged understanding.  No questions or concerns at this time.   ?

## 2021-12-05 NOTE — Telephone Encounter (Signed)
-----   Message from Truitt Merle, MD sent at 12/01/2021  8:15 PM EDT ----- ?Please let pt know her [name] was normal, no concerns. Thanks  ? ?Truitt Merle  ?

## 2021-12-06 NOTE — Anesthesia Preprocedure Evaluation (Addendum)
Anesthesia Evaluation  ?Patient identified by MRN, date of birth, ID band ?Patient awake ? ? ? ?Reviewed: ?Allergy & Precautions, NPO status , Patient's Chart, lab work & pertinent test results ? ?History of Anesthesia Complications ?Negative for: history of anesthetic complications ? ?Airway ?Mallampati: III ? ?TM Distance: >3 FB ?Neck ROM: Full ? ?Mouth opening: Limited Mouth Opening ? Dental ? ?(+) Chipped,  ?  ?Pulmonary ?neg pulmonary ROS,  ?  ?Pulmonary exam normal ? ? ? ? ? ? ? Cardiovascular ?hypertension, Pt. on medications ?Normal cardiovascular exam ? ?TTE 2021: EF 60-65%, mildly elevated PASP, valves ok ?  ?Neuro/Psych ? Headaches, Anxiety Depression   ? GI/Hepatic ?Neg liver ROS, hiatal hernia, GERD  Controlled,history of colon polyps, history of anal cancer ?  ?Endo/Other  ?negative endocrine ROS ? Renal/GU ?negative Renal ROS  ?negative genitourinary ?  ?Musculoskeletal ? ?(+) Arthritis ,  ? Abdominal ?  ?Peds ? Hematology ?negative hematology ROS ?(+)   ?Anesthesia Other Findings ?Day of surgery medications reviewed with patient. ? Reproductive/Obstetrics ?negative OB ROS ? ?  ? ? ? ? ? ? ? ? ? ? ? ? ? ?  ?  ? ? ? ? ? ? ? ?Anesthesia Physical ?Anesthesia Plan ? ?ASA: 2 ? ?Anesthesia Plan: MAC  ? ?Post-op Pain Management: Minimal or no pain anticipated  ? ?Induction:  ? ?PONV Risk Score and Plan: 2 and Treatment may vary due to age or medical condition and Propofol infusion ? ?Airway Management Planned: Natural Airway and Nasal Cannula ? ?Additional Equipment: None ? ?Intra-op Plan:  ? ?Post-operative Plan:  ? ?Informed Consent: I have reviewed the patients History and Physical, chart, labs and discussed the procedure including the risks, benefits and alternatives for the proposed anesthesia with the patient or authorized representative who has indicated his/her understanding and acceptance.  ? ? ? ? ? ?Plan Discussed with: CRNA ? ?Anesthesia Plan Comments:    ? ? ? ? ? ?Anesthesia Quick Evaluation ? ?

## 2021-12-07 ENCOUNTER — Other Ambulatory Visit: Payer: Self-pay

## 2021-12-07 ENCOUNTER — Encounter (HOSPITAL_COMMUNITY)
Admission: RE | Disposition: A | Discharge status: Home / Self Care | Payer: Self-pay | Source: Home / Self Care | Attending: Gastroenterology

## 2021-12-07 ENCOUNTER — Ambulatory Visit (HOSPITAL_BASED_OUTPATIENT_CLINIC_OR_DEPARTMENT_OTHER): Payer: PPO | Admitting: Anesthesiology

## 2021-12-07 ENCOUNTER — Ambulatory Visit (HOSPITAL_COMMUNITY): Payer: PPO | Admitting: Anesthesiology

## 2021-12-07 ENCOUNTER — Ambulatory Visit (HOSPITAL_COMMUNITY)
Admission: RE | Admit: 2021-12-07 | Discharge: 2021-12-07 | Disposition: A | Discharge status: Home / Self Care | Payer: PPO | Attending: Gastroenterology | Admitting: Gastroenterology

## 2021-12-07 ENCOUNTER — Encounter (HOSPITAL_COMMUNITY): Payer: Self-pay | Admitting: Gastroenterology

## 2021-12-07 DIAGNOSIS — Z923 Personal history of irradiation: Secondary | ICD-10-CM | POA: Insufficient documentation

## 2021-12-07 DIAGNOSIS — I10 Essential (primary) hypertension: Secondary | ICD-10-CM | POA: Diagnosis not present

## 2021-12-07 DIAGNOSIS — K449 Diaphragmatic hernia without obstruction or gangrene: Secondary | ICD-10-CM | POA: Insufficient documentation

## 2021-12-07 DIAGNOSIS — K6289 Other specified diseases of anus and rectum: Secondary | ICD-10-CM

## 2021-12-07 DIAGNOSIS — Z85048 Personal history of other malignant neoplasm of rectum, rectosigmoid junction, and anus: Secondary | ICD-10-CM | POA: Insufficient documentation

## 2021-12-07 DIAGNOSIS — K625 Hemorrhage of anus and rectum: Secondary | ICD-10-CM | POA: Diagnosis not present

## 2021-12-07 DIAGNOSIS — R1013 Epigastric pain: Secondary | ICD-10-CM | POA: Insufficient documentation

## 2021-12-07 DIAGNOSIS — F418 Other specified anxiety disorders: Secondary | ICD-10-CM | POA: Diagnosis not present

## 2021-12-07 DIAGNOSIS — K222 Esophageal obstruction: Secondary | ICD-10-CM | POA: Diagnosis not present

## 2021-12-07 DIAGNOSIS — K3189 Other diseases of stomach and duodenum: Secondary | ICD-10-CM | POA: Diagnosis not present

## 2021-12-07 DIAGNOSIS — K921 Melena: Secondary | ICD-10-CM | POA: Diagnosis not present

## 2021-12-07 HISTORY — PX: ESOPHAGOGASTRODUODENOSCOPY: SHX5428

## 2021-12-07 HISTORY — PX: COLONOSCOPY WITH PROPOFOL: SHX5780

## 2021-12-07 SURGERY — COLONOSCOPY WITH PROPOFOL
Anesthesia: Monitor Anesthesia Care

## 2021-12-07 MED ORDER — SODIUM CHLORIDE 0.9 % IV SOLN: INTRAVENOUS | Status: DC

## 2021-12-07 MED ORDER — PROPOFOL 500 MG/50ML IV EMUL
INTRAVENOUS | Status: DC | PRN
Start: 2021-12-07 — End: 2021-12-07
  Administered 2021-12-07: 20 mg via INTRAVENOUS

## 2021-12-07 MED ORDER — LACTATED RINGERS IV SOLN
INTRAVENOUS | Status: DC
  Administered 2021-12-07: 1000 mL via INTRAVENOUS

## 2021-12-07 MED ORDER — PROPOFOL 500 MG/50ML IV EMUL
INTRAVENOUS | Status: DC | PRN
  Administered 2021-12-07: 150 ug/kg/min via INTRAVENOUS

## 2021-12-07 SURGICAL SUPPLY — 22 items

## 2021-12-07 NOTE — Op Note (Signed)
[facility] ?Patient Name: Cynthia Velasquez ?Procedure Date: 12/07/2021 ?MRN: [id number] ?Attending MD: Clarene Essex , MD ?Date of Birth: [birth year] ?CSN: [id number] ?Age: 71 ?Admit Type: Outpatient ?Procedure:                Colonoscopy ?Indications:              Epigastric abdominal pain, Hematochezia, Personal  ?                          history of malignant rectal neoplasm ?Providers:                Clarene Essex, MD, Allayne Gitelman, RN, Despina Pole,  ?                          Technician, William Dalton, Technician ?Referring MD:              ?Medicines:                Monitored Anesthesia Care ?Complications:            No immediate complications. ?Estimated Blood Loss:     Estimated blood loss: none. ?Procedure:                Pre-Anesthesia Assessment: ?                          - Prior to the procedure, a History and Physical  ?                          was performed, and patient medications and  ?                          allergies were reviewed. The patient's tolerance of  ?                          previous anesthesia was also reviewed. The risks  ?                          and benefits of the procedure and the sedation  ?                          options and risks were discussed with the patient.  ?                          All questions were answered, and informed consent  ?                          was obtained. Prior Anticoagulants: The patient has  ?                          taken no previous anticoagulant or antiplatelet  ?                          agents except for aspirin. ASA Grade Assessment: II  ?                          -  A patient with mild systemic disease. After  ?                          reviewing the risks and benefits, the patient was  ?                          deemed in satisfactory condition to undergo the  ?                          procedure. ?                          - Prior to the procedure, a History and Physical  ?                          was performed, and  patient medications and  ?                          allergies were reviewed. The patient's tolerance of  ?                          previous anesthesia was also reviewed. The risks  ?                          and benefits of the procedure and the sedation  ?                          options and risks were discussed with the patient.  ?                          All questions were answered, and informed consent  ?                          was obtained. Prior Anticoagulants: The patient has  ?                          taken no previous anticoagulant or antiplatelet  ?                          agents except for aspirin. ASA Grade Assessment: II  ?                          - A patient with mild systemic disease. After  ?                          reviewing the risks and benefits, the patient was  ?                          deemed in satisfactory condition to undergo the  ?                          procedure. ?  After obtaining informed consent, the colonoscope  ?                          was passed under direct vision. Throughout the  ?                          procedure, the patient's blood pressure, pulse, and  ?                          oxygen saturations were monitored continuously. The  ?                          [id number] ([id number]) Olympus colonoscope was  ?                          introduced through the anus and advanced to the the  ?                          cecum, identified by appendiceal orifice and  ?                          ileocecal valve. The colonoscopy was performed  ?                          without difficulty. The patient tolerated the  ?                          procedure well. The quality of the bowel  ?                          preparation was adequate to identify polyps 6 mm  ?                          and larger in size. ?Scope In: 7:51:39 AM ?Scope Out: 8:06:02 AM ?Scope Withdrawal Time: 0 hours 9 minutes 4 seconds  ?Total Procedure Duration: 0 hours 14 minutes 23  seconds  ?Findings: ?     A localized area of mildly altered vascular, atrophic, eroded  ?     (linear-pattern), friable (with very minimal contact bleeding) when we  ?     tried to retroflex which was unsuccessful due to scarring and decreased  ?     compliance, scarred and vascular-pattern-decreased mucosa was found at  ?     the anus and in the rectum. It looked better than pictures on previous  ?     colonoscopy and there was no obvious radiation damage that required  ?     therapy ?     The colon (entire examined portion) appeared normal. ?     The exam was otherwise without abnormality. ?Impression:               - Altered vascular, atrophic, eroded  ?                          (linear-pattern), friable (with contact bleeding),  ?  scarred and vascular-pattern-decreased mucosa at  ?                          the anus and in the rectum. ?                          - The entire examined colon is normal. ?                          - The examination was otherwise normal. ?                          - No specimens collected. ?Moderate Sedation: ?     Not Applicable - Patient had care per Anesthesia. ?Recommendation:           - Patient has a contact number available for  ?                          emergencies. The signs and symptoms of potential  ?                          delayed complications were discussed with the  ?                          patient. Return to normal activities tomorrow.  ?                          Written discharge instructions were provided to the  ?                          patient. ?                          - Soft diet today. ?                          - Continue present medications. ?                          - Repeat colonoscopy in 3 years for screening  ?                          purposes. ?                          - Return to GI office in 3 months. ?                          - Telephone GI clinic if symptomatic PRN. ?Procedure Code(s):        --- Professional --- ?                           [phone], Colonoscopy, flexible; diagnostic, including  ?                          collection of specimen(s) by brushing or washing,  ?  when performed (separate procedure) ?Diagnosis Code(s):        --- Professional --- ?                          K62.5, Hemorrhage of anus and rectum ?                          K62.89, Other specified diseases of anus and rectum ?                          R10.13, Epigastric pain ?                          K92.1, Melena (includes Hematochezia) ?                          Z85.048, Personal history of other malignant  ?                          neoplasm of rectum, rectosigmoid junction, and anus ?CPT copyright 2019 [employer]. All rights reserved. ?The codes documented in this report are preliminary and upon coder review may  ?be revised to meet current compliance requirements. ?Clarene Essex, MD ?12/07/2021 8:25:40 AM ?This report has been signed electronically. ?Number of Addenda: 0 ?

## 2021-12-07 NOTE — Transfer of Care (Signed)
Immediate Anesthesia Transfer of Care Note ? ?[name] ? ?Procedure(s) Performed: COLONOSCOPY WITH PROPOFOL (Bilateral) ?ESOPHAGOGASTRODUODENOSCOPY (EGD) ? ?Patient Location: PACU ? ?Anesthesia Type:MAC ? ?Level of Consciousness: sedated, patient cooperative and responds to stimulation ? ?Airway & Oxygen Therapy: Patient Spontanous Breathing and Patient connected to face mask oxygen ? ?Post-op Assessment: Report given to RN and Post -op Vital signs reviewed and stable ? ?Post vital signs: Reviewed and stable ? ?Last Vitals:  ?Vitals Value Taken Time  ?BP    ?Temp 36.4 ?C 12/07/21 0814  ?Pulse 73 12/07/21 0814  ?Resp 11 12/07/21 0814  ?SpO2 100 % 12/07/21 0814  ? ? ?Last Pain:  ?Vitals:  ? 12/07/21 0814  ?TempSrc: Tympanic  ?PainSc:   ?   ? ?  ? ?Complications: No notable events documented. ?

## 2021-12-07 NOTE — Progress Notes (Signed)
Cynthia Velasquez ?7:30 AM ? ?Subjective: ?Patient no longer with constipation and periodic incontinence and also with upper abdominal pain and no other new complaints in her hospital computer chart was reviewed and her bleeding is actually been better ? ?Objective: ?Vital signs stable afebrile no acute distress exam please see preassessment evaluation labs and x-rays reviewed ? ?Assessment: ?Upper abdominal pain in patient with history of anal rectal cancer status post radiation therapy ? ?Plan: ?Okay to proceed with endoscopy and colonoscopy with anesthesia assistance ? ?[facility] ? ?office [phone] ?After 5PM or if no answer call [phone]  ?

## 2021-12-07 NOTE — Discharge Instructions (Signed)
Call if question or problem otherwise begin with soft solids and slowly advance diet and take 1 over-the-counter Prilosec generic omeprazole or Nexium every day and call in 2 weeks if not better however if better follow-up in 3 months repeat colonoscopy in 3 years ?

## 2021-12-07 NOTE — Anesthesia Postprocedure Evaluation (Signed)
Anesthesia Post Note ? ?[name] ? ?Procedure(s) Performed: COLONOSCOPY WITH PROPOFOL (Bilateral) ?ESOPHAGOGASTRODUODENOSCOPY (EGD) ? ?  ? ?Patient location during evaluation: PACU ?Anesthesia Type: MAC ?Level of consciousness: awake and alert ?Pain management: pain level controlled ?Vital Signs Assessment: post-procedure vital signs reviewed and stable ?Respiratory status: spontaneous breathing, nonlabored ventilation and respiratory function stable ?Cardiovascular status: blood pressure returned to baseline ?Postop Assessment: no apparent nausea or vomiting ?Anesthetic complications: no ? ? ?No notable events documented. ? ?Last Vitals:  ?Vitals:  ? 12/07/21 0840 12/07/21 0845  ?BP: (!) 190/98   ?Pulse: 63 60  ?Resp: (!) 22 20  ?Temp:    ?SpO2: 95% 99%  ?  ?Last Pain:  ?Vitals:  ? 12/07/21 0840  ?TempSrc:   ?PainSc: 0-No pain  ? ? ?  ?  ?  ?  ?  ?  ? ?Marthenia Rolling ? ? ? ? ?

## 2021-12-07 NOTE — Op Note (Signed)
[facility] ?Patient Name: Cynthia Velasquez ?Procedure Date: 12/07/2021 ?MRN: [id number] ?Attending MD: Clarene Essex , MD ?Date of Birth: [birth year] ?CSN: [id number] ?Age: 71 ?Admit Type: Outpatient ?Procedure:                Upper GI endoscopy ?Indications:              Epigastric abdominal pain ?Providers:                Clarene Essex, MD, Allayne Gitelman, RN, Despina Pole,  ?                          Technician, William Dalton, Technician ?Referring MD:              ?Medicines:                Monitored Anesthesia Care ?Complications:            No immediate complications. ?Estimated Blood Loss:     Estimated blood loss: none. ?Procedure:                Pre-Anesthesia Assessment: ?                          - Prior to the procedure, a History and Physical  ?                          was performed, and patient medications and  ?                          allergies were reviewed. The patient's tolerance of  ?                          previous anesthesia was also reviewed. The risks  ?                          and benefits of the procedure and the sedation  ?                          options and risks were discussed with the patient.  ?                          All questions were answered, and informed consent  ?                          was obtained. Prior Anticoagulants: The patient has  ?                          taken no previous anticoagulant or antiplatelet  ?                          agents except for aspirin. ASA Grade Assessment: II  ?                          - A patient with mild systemic disease. After  ?  reviewing the risks and benefits, the patient was  ?                          deemed in satisfactory condition to undergo the  ?                          procedure. ?                          After obtaining informed consent, the endoscope was  ?                          passed under direct vision. Throughout the  ?                          procedure, the patient's blood  pressure, pulse, and  ?                          oxygen saturations were monitored continuously. The  ?                          [id number]-[id number] ([id number]) Olympus endoscope was introduced  ?                          through the mouth, and advanced to the third part  ?                          of duodenum. The upper GI endoscopy was  ?                          accomplished without difficulty. The patient  ?                          tolerated the procedure well. ?Scope In: ?Scope Out: ?Findings: ?     The larynx was normal. ?     A small hiatal hernia was present. ?     One benign-appearing, intrinsic mild widely patent stenosis was found.  ?     The stenosis was easily traversed. ?     A few localized small erosions with no bleeding and no stigmata of  ?     recent bleeding were found in the gastric antrum. ?     The duodenal bulb, first portion of the duodenum, second portion of the  ?     duodenum and third portion of the duodenum were normal. ?     The exam was otherwise without abnormality. ?Impression:               - Normal larynx. ?                          - Small hiatal hernia. ?                          - Normal esophagus. ?                          - Normal stomach. ?                          -  Normal examined duodenum. ?                          - Benign-appearing esophageal stenosis. Widely  ?                          patent ?                          - Erosive gastropathy with no bleeding and no  ?                          stigmata of recent bleeding. ?                          - Normal duodenal bulb, first portion of the  ?                          duodenum, second portion of the duodenum and third  ?                          portion of the duodenum. ?                          - The examination was otherwise normal. ?                          - No specimens collected. ?Moderate Sedation: ?     Not Applicable - Patient had care per Anesthesia. ?Recommendation:           - Patient has a contact number available  for  ?                          emergencies. The signs and symptoms of potential  ?                          delayed complications were discussed with the  ?                          patient. Return to normal activities tomorrow.  ?                          Written discharge instructions were provided to the  ?                          patient. ?                          - Soft diet today. ?                          - Continue present medications. ?                          - Return to GI clinic in 3 months. ?                          -  Telephone GI clinic if symptomatic PRN. ?                          - Use Prilosec OTC 20 mg PO daily daily. ?                          - Perform a colonoscopy today. ?Procedure Code(s):        --- Professional --- ?                          [phone], Esophagogastroduodenoscopy, flexible,  ?                          transoral; diagnostic, including collection of  ?                          specimen(s) by brushing or washing, when performed  ?                          (separate procedure) ?Diagnosis Code(s):        --- Professional --- ?                          K44.9, Diaphragmatic hernia without obstruction or  ?                          gangrene ?                          K22.2, Esophageal obstruction ?                          K31.89, Other diseases of stomach and duodenum ?                          R10.13, Epigastric pain ?CPT copyright 2019 [employer] Association. All rights reserved. ?The codes documented in this report are preliminary and upon coder review may  ?be revised to meet current compliance requirements. ?Clarene Essex, MD ?12/07/2021 8:20:13 AM ?This report has been signed electronically. ?Number of Addenda: 0 ?

## 2021-12-10 ENCOUNTER — Encounter (HOSPITAL_COMMUNITY): Payer: Self-pay | Admitting: Gastroenterology

## 2021-12-19 DIAGNOSIS — M25519 Pain in unspecified shoulder: Secondary | ICD-10-CM | POA: Diagnosis not present

## 2021-12-19 DIAGNOSIS — M25512 Pain in left shoulder: Secondary | ICD-10-CM | POA: Diagnosis not present

## 2021-12-19 DIAGNOSIS — I10 Essential (primary) hypertension: Secondary | ICD-10-CM | POA: Diagnosis not present

## 2021-12-24 DIAGNOSIS — M25511 Pain in right shoulder: Secondary | ICD-10-CM | POA: Diagnosis not present

## 2022-01-02 DIAGNOSIS — N939 Abnormal uterine and vaginal bleeding, unspecified: Secondary | ICD-10-CM | POA: Diagnosis not present

## 2022-01-02 DIAGNOSIS — Z6835 Body mass index (BMI) 35.0-35.9, adult: Secondary | ICD-10-CM | POA: Diagnosis not present

## 2022-01-25 DIAGNOSIS — R413 Other amnesia: Secondary | ICD-10-CM | POA: Diagnosis not present

## 2022-01-25 DIAGNOSIS — Z6835 Body mass index (BMI) 35.0-35.9, adult: Secondary | ICD-10-CM | POA: Diagnosis not present

## 2022-01-25 DIAGNOSIS — R252 Cramp and spasm: Secondary | ICD-10-CM | POA: Diagnosis not present

## 2022-01-25 DIAGNOSIS — G43009 Migraine without aura, not intractable, without status migrainosus: Secondary | ICD-10-CM | POA: Diagnosis not present

## 2022-02-05 DIAGNOSIS — R413 Other amnesia: Secondary | ICD-10-CM | POA: Diagnosis not present

## 2022-02-05 DIAGNOSIS — E538 Deficiency of other specified B group vitamins: Secondary | ICD-10-CM | POA: Diagnosis not present

## 2022-02-05 DIAGNOSIS — R252 Cramp and spasm: Secondary | ICD-10-CM | POA: Diagnosis not present

## 2022-02-05 DIAGNOSIS — I10 Essential (primary) hypertension: Secondary | ICD-10-CM | POA: Diagnosis not present

## 2022-02-05 DIAGNOSIS — E7800 Pure hypercholesterolemia, unspecified: Secondary | ICD-10-CM | POA: Diagnosis not present

## 2022-02-05 DIAGNOSIS — K5900 Constipation, unspecified: Secondary | ICD-10-CM | POA: Diagnosis not present

## 2022-03-13 DIAGNOSIS — Z96611 Presence of right artificial shoulder joint: Secondary | ICD-10-CM | POA: Diagnosis not present

## 2022-03-13 DIAGNOSIS — M25511 Pain in right shoulder: Secondary | ICD-10-CM | POA: Diagnosis not present

## 2022-03-13 DIAGNOSIS — M19012 Primary osteoarthritis, left shoulder: Secondary | ICD-10-CM | POA: Diagnosis not present

## 2022-03-22 ENCOUNTER — Encounter: Payer: Self-pay | Admitting: Hematology

## 2022-03-22 ENCOUNTER — Inpatient Hospital Stay (HOSPITAL_BASED_OUTPATIENT_CLINIC_OR_DEPARTMENT_OTHER): Payer: PPO | Admitting: Hematology

## 2022-03-22 ENCOUNTER — Other Ambulatory Visit: Payer: Self-pay

## 2022-03-22 ENCOUNTER — Inpatient Hospital Stay: Payer: PPO | Attending: Hematology

## 2022-03-22 VITALS — BP 152/97 | HR 72 | Temp 98.4°F | Resp 17 | Ht 65.0 in | Wt 218.2 lb

## 2022-03-22 DIAGNOSIS — Z79899 Other long term (current) drug therapy: Secondary | ICD-10-CM | POA: Diagnosis not present

## 2022-03-22 DIAGNOSIS — C210 Malignant neoplasm of anus, unspecified: Secondary | ICD-10-CM

## 2022-03-22 DIAGNOSIS — Z853 Personal history of malignant neoplasm of breast: Secondary | ICD-10-CM | POA: Diagnosis not present

## 2022-03-22 DIAGNOSIS — Z9013 Acquired absence of bilateral breasts and nipples: Secondary | ICD-10-CM | POA: Insufficient documentation

## 2022-03-22 DIAGNOSIS — I10 Essential (primary) hypertension: Secondary | ICD-10-CM | POA: Insufficient documentation

## 2022-03-22 DIAGNOSIS — D500 Iron deficiency anemia secondary to blood loss (chronic): Secondary | ICD-10-CM

## 2022-03-22 DIAGNOSIS — Z923 Personal history of irradiation: Secondary | ICD-10-CM | POA: Diagnosis not present

## 2022-03-22 DIAGNOSIS — Z9221 Personal history of antineoplastic chemotherapy: Secondary | ICD-10-CM | POA: Diagnosis not present

## 2022-03-22 LAB — CBC WITH DIFFERENTIAL/PLATELET
Abs Immature Granulocytes: 0 10*3/uL (ref 0.00–0.07)
Basophils Absolute: 0 10*3/uL (ref 0.0–0.1)
Basophils Relative: 0 %
Eosinophils Absolute: 0.1 10*3/uL (ref 0.0–0.5)
Eosinophils Relative: 2 %
HCT: 40.5 % (ref 36.0–46.0)
Hemoglobin: 13.1 g/dL (ref 12.0–15.0)
Immature Granulocytes: 0 %
Lymphocytes Relative: 34 %
Lymphs Abs: 1.5 10*3/uL (ref 0.7–4.0)
MCH: 27.6 pg (ref 26.0–34.0)
MCHC: 32.3 g/dL (ref 30.0–36.0)
MCV: 85.4 fL (ref 80.0–100.0)
Monocytes Absolute: 0.5 10*3/uL (ref 0.1–1.0)
Monocytes Relative: 10 %
Neutro Abs: 2.4 10*3/uL (ref 1.7–7.7)
Neutrophils Relative %: 54 %
Platelets: 220 10*3/uL (ref 150–400)
RBC: 4.74 MIL/uL (ref 3.87–5.11)
RDW: 14.9 % (ref 11.5–15.5)
WBC: 4.5 10*3/uL (ref 4.0–10.5)
nRBC: 0 % (ref 0.0–0.2)

## 2022-03-22 LAB — IRON AND IRON BINDING CAPACITY (CC-WL,HP ONLY)
Iron: 50 ug/dL (ref 28–170)
Saturation Ratios: 14 % (ref 10.4–31.8)
TIBC: 354 ug/dL (ref 250–450)
UIBC: 304 ug/dL (ref 148–442)

## 2022-03-22 LAB — COMPREHENSIVE METABOLIC PANEL
ALT: 15 U/L (ref 0–44)
AST: 16 U/L (ref 15–41)
Albumin: 4 g/dL (ref 3.5–5.0)
Alkaline Phosphatase: 89 U/L (ref 38–126)
Anion gap: 6 (ref 5–15)
BUN: 15 mg/dL (ref 8–23)
CO2: 29 mmol/L (ref 22–32)
Calcium: 9.6 mg/dL (ref 8.9–10.3)
Chloride: 106 mmol/L (ref 98–111)
Creatinine, Ser: 1.03 mg/dL — ABNORMAL HIGH (ref 0.44–1.00)
GFR, Estimated: 58 mL/min — ABNORMAL LOW (ref >60)
Glucose, Bld: 106 mg/dL — ABNORMAL HIGH (ref 70–99)
Potassium: 4 mmol/L (ref 3.5–5.1)
Sodium: 141 mmol/L (ref 135–145)
Total Bilirubin: 0.3 mg/dL (ref 0.3–1.2)
Total Protein: 7.3 g/dL (ref 6.5–8.1)

## 2022-03-22 LAB — FERRITIN: Ferritin: 12 ng/mL (ref 11–307)

## 2022-03-22 MED ORDER — NYSTATIN 100000 UNIT/GM EX POWD: 1.0000 | Freq: Three times a day (TID) | CUTANEOUS | 2 refills | Status: DC

## 2022-03-22 NOTE — Progress Notes (Signed)
[place]   Telephone:(336) [phone] Fax:(336) [phone]   Clinic Follow up Note   Patient Care Team: Kathyrn Lass, MD as PCP - General (Family Medicine) Wonda Horner, MD as Consulting Physician (Gastroenterology) Truitt Merle, MD as Consulting Physician (Hematology) Alla Feeling, NP as Nurse Practitioner (Nurse Practitioner) Kyung Rudd, MD as Consulting Physician (Radiation Oncology) Michael Boston, MD as Consulting Physician (General Surgery)  Date of Service:  03/22/2022  CHIEF COMPLAINT: f/u of anal cancer  CURRENT THERAPY:  Surveillance  ASSESSMENT & PLAN:  Cynthia Velasquez is a 71 y.o. female with   1. Anal Squamous Cell Carcinoma, cT2N0M0 -diagnosed in 05/2019. Workup showed a 3 cm mass at the anorectal junction, biopsy confirmed well to moderately differentiated squamous cell carcinoma. 06/21/19 PET scan showed no metastasis.  -She completed standard treatment with concurrent chemoRT with Mitomycin and 5FU on 08/09/19.  -post-treatment PET scan on 11/01/19 showed near complete response.   -surveillance CT CAP 07/09/21 was NED. -surveillance colonoscopy done for rectal bleeding on 12/07/21 showed only radiation changes. Her rectal bleeding has resolved. -she is clinically doing well. She notes some continued abdominal pain/cramping but manages with tylenol as needed. Labs reviewed, overall WNL. Physical exam was unremarkable except for some excess moisture; I will call in nystatin powder for her.  -she will be three years out from diagnosis this fall. We will plan for one more surveillance scan in 06/2022. -lab and f/u in 3 months   2. 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 Completed tamoxifen 09/2007 - 09/2012 -2008: stage pT2N0 left breast invasive ductal carcinoma, triple negative, grade 3; s/p lumpectomy in 07/2007 and bilateral mastectomy 11/2007 (path negative for residual  carcinoma in both breasts); s/p adjuvant chemo AC-T and radiation  -Per pt her Genetics Testing was negative. She was adopted, no known family history     3. HTN -on losartan. Continue to f/u with PCP     PLAN: -f/u in 3 months with lab and CT several days before   No problem-specific Assessment & Plan notes found for this encounter.   SUMMARY OF ONCOLOGIC HISTORY: Oncology History Overview Note  Cancer Staging Anal cancer ([place]) Staging form: Anus, AJCC 8th Edition - Clinical stage from 06/28/2019: Stage IIA (cT2, cN0, cM0) - Signed by Alla Feeling, NP on 06/28/2019  Breast cancer of upper-outer quadrant of left female breast [facility]) Staging form: Breast, AJCC 7th Edition - Clinical stage from 09/07/2013: Stage IIA (T2, N0, cM0) - Signed by [name], MD on 09/07/2013 - Pathologic: Stage IIA (T2, N0, cM0) - Signed by [name], MD on 09/07/2013  Breast cancer of upper-outer quadrant of right female breast [facility]) Staging form: Breast, AJCC 7th Edition - Clinical stage from 09/07/2013: Stage IA (T1a, N0, cM0) - Signed by [name], MD on 09/07/2013 - Pathologic: Stage IA (T1a, N0, cM0) - Signed by [name], MD on 09/07/2013    Breast cancer of upper-outer quadrant of left female breast ([place])  07/10/2007 Procedure   US biopsy showed invasive ductal carcinoma 0.8cm, Nottingham grade 3   07/30/2007 Surgery   She had left breast lumpectomy and LN biopsy which showed high grade invasive ductal cancer Nottingham grade 3, 2.7 cm, ER/PR/Her2 neu negative   11/24/2007 Surgery   Patient elected for bilateral mastectomy   09/07/2008 - 03/08/2009 Chemotherapy   dates are approximate: she received adriamycin and cytoxan followed by Taxol  03/08/2009 - 05/08/2009 Radiation Therapy   dates are approximate, she received XRT    07/09/2021 Imaging   CT CAP  IMPRESSION: 1. No evidence to suggest locally recurrent ianorectal neoplasm or metastatic disease in the chest, abdomen or  pelvis. 2. Status post bilateral modified radical mastectomy and bilateral axillary lymph node dissection. 3. Three small supraumbilical ventral hernias containing only omental fat. No associated bowel incarceration or obstruction at this time. 4. Aortic atherosclerosis. 5. Additional incidental findings, as above.   Breast cancer of upper-outer quadrant of right female breast ([place])  07/10/2006 Procedure   stereotactic biopsy showed atypical hyperplasia   10/23/2006 Surgery   right lumpectomy showed invasive ductal ca (Nottingham grade 1)  58m and DCIS, ER/PR positive Her 2 negative   10/09/2007 - 10/08/2012 Chemotherapy   Patient was placed on Tamoxifen   03/24/2017 Imaging   No acute findings. No evidence of recurrent carcinoma or metastatic disease   07/09/2021 Imaging   CT CAP  IMPRESSION: 1. No evidence to suggest locally recurrent ianorectal neoplasm or metastatic disease in the chest, abdomen or pelvis. 2. Status post bilateral modified radical mastectomy and bilateral axillary lymph node dissection. 3. Three small supraumbilical ventral hernias containing only omental fat. No associated bowel incarceration or obstruction at this time. 4. Aortic atherosclerosis. 5. Additional incidental findings, as above.   [place]  06/04/2019 Procedure   Colonoscopy per Dr. SAnson Fret Findings-the digital rectal exam revealed a 3 cm diameter firm rectal mass.  The mass was noncircumferential and located predominantly at the right bowel wall at the anorectal junction. A nonobstructing mass was found at the anus and in the rectum    06/04/2019 Initial Biopsy   Follow pathology: Large intestine, rectum biopsy: Invasive well to moderately differentiated squamous cell carcinoma.  No rectal mucosa present.  There is strong diffuse expression of P 16 immunostain.  CDX 2, p63 and mCEA immunostains are also used in the diagnostic work-up of the case.   06/04/2019 Initial Diagnosis    [place]   06/21/2019 PET scan   IMPRESSION: 1. Anorectal primary. No hypermetabolic metastatic disease within the chest, abdomen, or pelvis. Perirectal nodes, at least 1 of which is new since 02/26/2018 CT, suspicious based on size and interval development. 2. Mild limitations secondary to beam hardening artifact from bilateral hip arthroplasty. 3.  Aortic Atherosclerosis (ICD10-I70.0).   06/28/2019 Cancer Staging   Staging form: Anus, AJCC 8th Edition - Clinical stage from 06/28/2019: Stage IIA (cT2, cN0, cM0) - Signed by BAlla Feeling NP on 06/28/2019   06/28/2019 - 07/26/2019 Chemotherapy   concurrent chemoRT with Mitomycin and 5FU on week 1 and week 5 on starting 06/28/19. Last dose on 07/26/19   06/28/2019 - 08/09/2019 Radiation Therapy   concurrent chemoRT with Dr MLisbeth Renshaw10/12/20-11/23/20   11/01/2019 PET scan   IMPRESSION: 1. Marked interval decrease in hypermetabolism noted at the level of the anal rectal primary. No evidence for hypermetabolic metastatic disease in the chest, abdomen, or pelvis. The perirectal lymph nodes identified previously have resolved in the interval. 2.  Aortic Atherosclerois (ICD10-170.0)   08/07/2020 Imaging   CT CAP  IMPRESSION: 1. No findings for residual/recurrent anal tumor, regional lymphadenopathy or metastatic disease. 2. Status post cholecystectomy. No biliary dilatation. 3. Small anterior abdominal wall hernia containing fat. 4. Aortic atherosclerosis.   Aortic Atherosclerosis (ICD10-I70.0).     07/09/2021 Imaging   CT CAP  IMPRESSION: 1. No evidence to suggest locally recurrent ianorectal neoplasm or metastatic  disease in the chest, abdomen or pelvis. 2. Status post bilateral modified radical mastectomy and bilateral axillary lymph node dissection. 3. Three small supraumbilical ventral hernias containing only omental fat. No associated bowel incarceration or obstruction at this time. 4. Aortic  atherosclerosis. 5. Additional incidental findings, as above.      INTERVAL HISTORY:  Cynthia Velasquez is here for a follow up of anal cancer. She was last seen by me on 11/20/21. She presents to the clinic alone. She reports she is doing well. She reports her rectal bleeding has resolved but notes her abdominal pain/cramping continues. She adds that she manages with tylenol as needed. She reports her bowel movements are pencil-thin, and she has to push sometimes to get it started. She notes she tried miralax, which didn't seem to do anything, and linzess, which caused diarrhea.   All other systems were reviewed with the patient and are negative.  MEDICAL HISTORY:  Past Medical History:  Diagnosis Date   Anal cancer ([place]) dx'd 05/2019   Anxiety    Arthritis    Bilateral breast cancer ([place]) 10/21/2013   Breast cancer ([place])    Cancer ([place])    Depression    Gallstones    GERD (gastroesophageal reflux disease)    doesn't take any meds for this   Headache    h/o migraines       History of bladder infections    History of hiatal hernia    History of migraine    Hyperlipidemia    takes Simvastatin daily   Hypertension    takes Hyzaar   Joint pain    Joint swelling    Lumbar stenosis    Neuropathy 09/07/2013   Pneumonia    Pre-diabetes     SURGICAL HISTORY: Past Surgical History:  Procedure Laterality Date   ABDOMINAL HYSTERECTOMY     bone spur removed from left foot     CHOLECYSTECTOMY     COLONOSCOPY     COLONOSCOPY WITH PROPOFOL Bilateral 12/07/2021   Procedure: COLONOSCOPY WITH PROPOFOL;  Surgeon: Clarene Essex, MD;  Location: [facility];  Service: Endoscopy;  Laterality: Bilateral;   double mastectomy      ESOPHAGOGASTRODUODENOSCOPY N/A 12/07/2021   Procedure: ESOPHAGOGASTRODUODENOSCOPY (EGD);  Surgeon: Clarene Essex, MD;  Location: [facility];  Service: Endoscopy;  Laterality: N/A;   HERNIA REPAIR     umbilical   right knee arthroscopy     right shoulder arthroscopy      surgery for hiatal hernia     TONSILLECTOMY     TOTAL HIP ARTHROPLASTY Left 02/12/2013   TOTAL HIP ARTHROPLASTY Left 02/12/2013   Procedure: LEFT TOTAL HIP ARTHROPLASTY;  Surgeon: Kerin Salen, MD;  Location: [place];  Service: Orthopedics;  Laterality: Left;   TOTAL HIP ARTHROPLASTY Right 05/03/2013   Procedure: TOTAL HIP ARTHROPLASTY;  Surgeon: Kerin Salen, MD;  Location: [place];  Service: Orthopedics;  Laterality: Right;   TOTAL KNEE ARTHROPLASTY Right 02/27/2015   Procedure: TOTAL KNEE ARTHROPLASTY;  Surgeon: Frederik Pear, MD;  Location: [place];  Service: Orthopedics;  Laterality: Right;   TOTAL SHOULDER ARTHROPLASTY Right 09/12/2016   Procedure: RIGHT TOTAL SHOULDER ARTHROPLASTY;  Surgeon: Tania Ade, MD;  Location: [place];  Service: Orthopedics;  Laterality: Right;  RIGHT TOTAL SHOULDER ARTHROPLASTY    I have reviewed the social history and family history with the patient and they are unchanged from previous note.  ALLERGIES:  is allergic to oxycodone-acetaminophen, codeine, and vicodin [hydrocodone-acetaminophen].  MEDICATIONS:  Current Outpatient Medications  Medication Sig Dispense Refill   nystatin (MYCOSTATIN/NYSTOP) powder Apply 1 Application topically 3 (three) times daily. 30 g 2   aspirin-acetaminophen-caffeine (EXCEDRIN MIGRAINE) 250-250-65 MG tablet Take 2 tablets by mouth every 6 (six) hours as needed for headache.     LINZESS 72 MCG capsule Take 72 mcg by mouth daily as needed (constipation).     losartan (COZAAR) 100 MG tablet Take 100 mg by mouth daily.  6   meclizine (ANTIVERT) 12.5 MG tablet Take 1 tablet (12.5 mg total) by mouth 3 (three) times daily as needed for dizziness. 30 tablet 0   Menthol-Methyl Salicylate (SALONPAS PAIN RELIEF PATCH EX) Apply 1 patch topically daily as needed (pain).     rosuvastatin (CRESTOR) 40 MG tablet Take 40 mg by mouth daily.     No current facility-administered medications for this visit.    PHYSICAL EXAMINATION: ECOG PERFORMANCE  STATUS: 0 - Asymptomatic  Vitals:   03/22/22 0809  BP: (!) 152/97  Pulse: 72  Resp: 17  Temp: 98.4 F (36.9 C)  SpO2: 97%   Wt Readings from Last 3 Encounters:  03/22/22 218 lb 3.2 oz (99 kg)  12/07/21 223 lb (101.2 kg)  11/20/21 221 lb 12.8 oz (100.6 kg)     GENERAL:alert, no distress and comfortable SKIN: skin color, texture, turgor are normal, no rashes or significant lesions EYES: normal, Conjunctiva are pink and non-injected, sclera clear  NECK: supple, thyroid normal size, non-tender, without nodularity LYMPH:  no palpable lymphadenopathy in the cervical, axillary or inguinal LUNGS: clear to auscultation and percussion with normal breathing effort HEART: regular rate & rhythm and no murmurs and no lower extremity edema ABDOMEN: soft, (+) tender to palpation. Musculoskeletal:no cyanosis of digits and no clubbing  NEURO: alert & oriented x 3 with fluent speech, no focal motor/sensory deficits RECTAL: No palpable mass. No blood on glove. Normal stool present. Benign exam    LABORATORY DATA:  I have reviewed the data as listed    Latest Ref Rng & Units 03/22/2022    7:22 AM 11/20/2021    8:36 AM 10/27/2021    1:24 PM  CBC  WBC 4.0 - 10.5 K/uL 4.5  4.5  4.9   Hemoglobin 12.0 - 15.0 g/dL 13.1  12.5  14.0   Hematocrit 36.0 - 46.0 % 40.5  39.4  44.0   Platelets 150 - 400 K/uL 220  242  241         Latest Ref Rng & Units 03/22/2022    7:22 AM 11/20/2021    8:36 AM 10/27/2021    1:24 PM  CMP  Glucose 70 - 99 mg/dL 106  95  102   BUN 8 - 23 mg/dL '15  17  12   ' Creatinine 0.44 - 1.00 mg/dL 1.03  0.94  0.83   Sodium 135 - 145 mmol/L 141  142  138   Potassium 3.5 - 5.1 mmol/L 4.0  4.2  4.0   Chloride 98 - 111 mmol/L 106  109  105   CO2 22 - 32 mmol/L '29  28  28   ' Calcium 8.9 - 10.3 mg/dL 9.6  9.4  9.3   Total Protein 6.5 - 8.1 g/dL 7.3  7.1    Total Bilirubin 0.3 - 1.2 mg/dL 0.3  0.3    Alkaline Phos 38 - 126 U/L 89  84    AST 15 - 41 U/L 16  13    ALT 0 - 44 U/L 15  9  RADIOGRAPHIC STUDIES: I have personally reviewed the radiological images as listed and agreed with the findings in the report. No results found.    Orders Placed This Encounter  Procedures   CT CHEST ABDOMEN PELVIS W CONTRAST    Standing Status:   Future    Standing Expiration Date:   03/23/2023    Order Specific Question:   Preferred imaging location?    Answer:   [facility]    Order Specific Question:   Is Oral Contrast requested for this exam?    Answer:   Yes, Per Radiology protocol   CBC with Differential/Platelet    Standing Status:   Standing    Number of Occurrences:   50    Standing Expiration Date:   03/23/2023   Comprehensive metabolic panel    Standing Status:   Standing    Number of Occurrences:   50    Standing Expiration Date:   03/23/2023   Ferritin    Standing Status:   Standing    Number of Occurrences:   20    Standing Expiration Date:   03/23/2023   Iron and TIBC    Standing Status:   Standing    Number of Occurrences:   5    Standing Expiration Date:   03/23/2023   All questions were answered. The patient knows to call the clinic with any problems, questions or concerns. No barriers to learning was detected. The total time spent in the appointment was 30 minutes.     Truitt Merle, MD 03/22/2022   I, Wilburn Mylar, am acting as scribe for Truitt Merle, MD.   I have reviewed the above documentation for accuracy and completeness, and I agree with the above.

## 2022-04-01 DIAGNOSIS — M5432 Sciatica, left side: Secondary | ICD-10-CM | POA: Diagnosis not present

## 2022-04-07 ENCOUNTER — Emergency Department (HOSPITAL_COMMUNITY): Payer: PPO

## 2022-04-07 ENCOUNTER — Emergency Department (HOSPITAL_COMMUNITY)
Admission: EM | Admit: 2022-04-07 | Discharge: 2022-04-07 | Disposition: A | Discharge status: Home / Self Care | Payer: PPO | Attending: Emergency Medicine | Admitting: Emergency Medicine

## 2022-04-07 ENCOUNTER — Encounter (HOSPITAL_COMMUNITY): Payer: Self-pay

## 2022-04-07 ENCOUNTER — Other Ambulatory Visit: Payer: Self-pay

## 2022-04-07 DIAGNOSIS — N3289 Other specified disorders of bladder: Secondary | ICD-10-CM | POA: Diagnosis not present

## 2022-04-07 DIAGNOSIS — R079 Chest pain, unspecified: Secondary | ICD-10-CM | POA: Diagnosis not present

## 2022-04-07 DIAGNOSIS — R1032 Left lower quadrant pain: Secondary | ICD-10-CM | POA: Diagnosis not present

## 2022-04-07 DIAGNOSIS — Z85048 Personal history of other malignant neoplasm of rectum, rectosigmoid junction, and anus: Secondary | ICD-10-CM | POA: Insufficient documentation

## 2022-04-07 DIAGNOSIS — R109 Unspecified abdominal pain: Secondary | ICD-10-CM | POA: Diagnosis not present

## 2022-04-07 DIAGNOSIS — R112 Nausea with vomiting, unspecified: Secondary | ICD-10-CM | POA: Insufficient documentation

## 2022-04-07 DIAGNOSIS — R1111 Vomiting without nausea: Secondary | ICD-10-CM | POA: Diagnosis not present

## 2022-04-07 DIAGNOSIS — R110 Nausea: Secondary | ICD-10-CM | POA: Diagnosis not present

## 2022-04-07 DIAGNOSIS — Z853 Personal history of malignant neoplasm of breast: Secondary | ICD-10-CM | POA: Insufficient documentation

## 2022-04-07 DIAGNOSIS — N281 Cyst of kidney, acquired: Secondary | ICD-10-CM | POA: Diagnosis not present

## 2022-04-07 DIAGNOSIS — R197 Diarrhea, unspecified: Secondary | ICD-10-CM | POA: Diagnosis not present

## 2022-04-07 DIAGNOSIS — M5432 Sciatica, left side: Secondary | ICD-10-CM | POA: Diagnosis not present

## 2022-04-07 DIAGNOSIS — R1030 Lower abdominal pain, unspecified: Secondary | ICD-10-CM | POA: Diagnosis not present

## 2022-04-07 DIAGNOSIS — M5442 Lumbago with sciatica, left side: Secondary | ICD-10-CM | POA: Diagnosis not present

## 2022-04-07 DIAGNOSIS — R1084 Generalized abdominal pain: Secondary | ICD-10-CM | POA: Diagnosis not present

## 2022-04-07 LAB — COMPREHENSIVE METABOLIC PANEL
ALT: 17 U/L (ref 0–44)
AST: 13 U/L — ABNORMAL LOW (ref 15–41)
Albumin: 3.3 g/dL — ABNORMAL LOW (ref 3.5–5.0)
Alkaline Phosphatase: 83 U/L (ref 38–126)
Anion gap: 6 (ref 5–15)
BUN: 15 mg/dL (ref 8–23)
CO2: 28 mmol/L (ref 22–32)
Calcium: 8.9 mg/dL (ref 8.9–10.3)
Chloride: 107 mmol/L (ref 98–111)
Creatinine, Ser: 0.97 mg/dL (ref 0.44–1.00)
GFR, Estimated: >60 mL/min (ref >60)
Glucose, Bld: 96 mg/dL (ref 70–99)
Potassium: 3.6 mmol/L (ref 3.5–5.1)
Sodium: 141 mmol/L (ref 135–145)
Total Bilirubin: 0.5 mg/dL (ref 0.3–1.2)
Total Protein: 6.4 g/dL — ABNORMAL LOW (ref 6.5–8.1)

## 2022-04-07 LAB — URINALYSIS, ROUTINE W REFLEX MICROSCOPIC
Bacteria, UA: NONE SEEN
Bilirubin Urine: NEGATIVE
Glucose, UA: NEGATIVE mg/dL
Ketones, ur: NEGATIVE mg/dL
Nitrite: NEGATIVE
Protein, ur: NEGATIVE mg/dL
Specific Gravity, Urine: 1.006 (ref 1.005–1.030)
pH: 7 (ref 5.0–8.0)

## 2022-04-07 LAB — LIPASE, BLOOD: Lipase: 24 U/L (ref 11–51)

## 2022-04-07 LAB — CBC
HCT: 42.2 % (ref 36.0–46.0)
Hemoglobin: 13.3 g/dL (ref 12.0–15.0)
MCH: 27.5 pg (ref 26.0–34.0)
MCHC: 31.5 g/dL (ref 30.0–36.0)
MCV: 87.2 fL (ref 80.0–100.0)
Platelets: 261 10*3/uL (ref 150–400)
RBC: 4.84 MIL/uL (ref 3.87–5.11)
RDW: 15.6 % — ABNORMAL HIGH (ref 11.5–15.5)
WBC: 5.1 10*3/uL (ref 4.0–10.5)
nRBC: 0 % (ref 0.0–0.2)

## 2022-04-07 MED ORDER — MORPHINE SULFATE (PF) 4 MG/ML IV SOLN
4.0000 mg | Freq: Once | INTRAVENOUS | Status: AC
  Administered 2022-04-07: 4 mg via INTRAVENOUS
  Filled 2022-04-07: qty 1

## 2022-04-07 MED ORDER — MORPHINE SULFATE 15 MG PO TABS: 7.5000 mg | ORAL_TABLET | Freq: Four times a day (QID) | ORAL | 0 refills | Status: DC | PRN

## 2022-04-07 MED ORDER — IOHEXOL 300 MG/ML  SOLN
100.0000 mL | Freq: Once | INTRAMUSCULAR | Status: AC | PRN
  Administered 2022-04-07: 100 mL via INTRAVENOUS

## 2022-04-07 MED ORDER — METHOCARBAMOL 500 MG PO TABS: 500.0000 mg | ORAL_TABLET | Freq: Two times a day (BID) | ORAL | 0 refills | Status: DC

## 2022-04-07 MED ORDER — SODIUM CHLORIDE (PF) 0.9 % IJ SOLN
INTRAMUSCULAR | Status: AC
  Filled 2022-04-07: qty 50

## 2022-04-07 MED ORDER — ONDANSETRON HCL 4 MG/2ML IJ SOLN
4.0000 mg | Freq: Once | INTRAMUSCULAR | Status: AC
  Administered 2022-04-07: 4 mg via INTRAVENOUS
  Filled 2022-04-07: qty 2

## 2022-04-07 NOTE — ED Triage Notes (Signed)
Pt states she has been having some really bad sciatica pain for over a week. Pt now reports nausea, vomiting, and left lower abdominal pain for the past couple of days.

## 2022-04-07 NOTE — ED Provider Notes (Signed)
[facility] DEPT Provider Note   CSN: [id number] Arrival date & time: 04/07/22  0920     History  Chief Complaint  Patient presents with   Abdominal Pain   Nausea   Vomiting   Sciatica    Cynthia Velasquez is a 71 y.o. female with past medical history significant for osteoarthritis, previously diagnosed with breast cancer, anal cancer, with chronic sciatica who presents with concern for breakthrough left-sided sciatica pain, nausea, vomiting, left lower quadrant pain for the last few days.  Patient was recently being treated for sciatica with gabapentin, prednisone, reports that she finished course of these medications and has had anything worsening pain, decreased ability to walk.  She denies new numbness, tingling, urinary or fecal incontinence.  Denies previous history of IV drug use, chronic corticosteroid use.  She is not currently undergoing treatment for her previous cancers.  No signs of metastatic or active cancer noted.  She denies significant fever, chills.  She reports significant weakness of the left leg secondary to pain.   Abdominal Pain      Home Medications Prior to Admission medications   Medication Sig Start Date End Date Taking? Authorizing Provider  methocarbamol (ROBAXIN) 500 MG tablet Take 1 tablet (500 mg total) by mouth 2 (two) times daily. 04/07/22  Yes Evey Mcmahan H, PA-C  morphine (MSIR) 15 MG tablet Take 0.5 tablets (7.5 mg total) by mouth every 6 (six) hours as needed for severe pain. 04/07/22  Yes Brantley Wiley H, PA-C  aspirin-acetaminophen-caffeine (EXCEDRIN MIGRAINE) [id number] MG tablet Take 2 tablets by mouth every 6 (six) hours as needed for headache.    [provider]  LINZESS 72 MCG capsule Take 72 mcg by mouth daily as needed (constipation). 06/04/19   [provider]  losartan (COZAAR) 100 MG tablet Take 100 mg by mouth daily. 01/14/17   [provider]  meclizine (ANTIVERT) 12.5  MG tablet Take 1 tablet (12.5 mg total) by mouth 3 (three) times daily as needed for dizziness. 10/27/21   Hayden Rasmussen, MD  Menthol-Methyl Salicylate [facility] PAIN RELIEF PATCH EX) Apply 1 patch topically daily as needed (pain).    [provider]  nystatin (MYCOSTATIN/NYSTOP) powder Apply 1 Application topically 3 (three) times daily. 03/22/22   Truitt Merle, MD  rosuvastatin (CRESTOR) 40 MG tablet Take 40 mg by mouth daily. 07/30/19   [provider]      Allergies    Oxycodone-acetaminophen, Codeine, and Vicodin [hydrocodone-acetaminophen]    Review of Systems   Review of Systems  Gastrointestinal:  Positive for abdominal pain.  Musculoskeletal:  Positive for back pain and gait problem.  All other systems reviewed and are negative.   Physical Exam Updated Vital Signs BP (!) 152/77   Pulse (!) 53   Temp 97.7 F (36.5 C) (Oral)   Resp 16   Ht '5\' 5"'$  (1.651 m)   Wt 98.9 kg   SpO2 94%   BMI 36.28 kg/m  Physical Exam Vitals and nursing note reviewed.  Constitutional:      General: She is not in acute distress.    Appearance: Normal appearance.     Comments: Uncomfortable appearing female sitting in bed no acute distress  HENT:     Head: Normocephalic and atraumatic.  Eyes:     General:        Right eye: No discharge.        Left eye: No discharge.  Cardiovascular:     Rate and Rhythm: Normal  rate and regular rhythm.     Heart sounds: No murmur heard.    No friction rub. No gallop.  Pulmonary:     Effort: Pulmonary effort is normal.     Breath sounds: Normal breath sounds.  Abdominal:     General: Bowel sounds are normal.     Palpations: Abdomen is soft.     Comments: Significant tenderness palpation left lower quadrant without rebound, rigidity, guarding, normal bowel sounds throughout.  No distention, or mass palpated  Musculoskeletal:     Comments: Decreased strength to flexion, extension of the left hip secondary to pain.  Intact range of motion  both passively and actively.  Positive straight leg raise on the left.  Skin:    General: Skin is warm and dry.     Capillary Refill: Capillary refill takes less than 2 seconds.  Neurological:     Mental Status: She is alert and oriented to person, place, and time.  Psychiatric:        Mood and Affect: Mood normal.        Behavior: Behavior normal.     ED Results / Procedures / Treatments   Labs (all labs ordered are listed, but only abnormal results are displayed) Labs Reviewed  COMPREHENSIVE METABOLIC PANEL - Abnormal; Notable for the following components:      Result Value   Total Protein 6.4 (*)    Albumin 3.3 (*)    AST 13 (*)    All other components within normal limits  CBC - Abnormal; Notable for the following components:   RDW 15.6 (*)    All other components within normal limits  URINALYSIS, ROUTINE W REFLEX MICROSCOPIC - Abnormal; Notable for the following components:   Color, Urine STRAW (*)    Hgb urine dipstick SMALL (*)    Leukocytes,Ua TRACE (*)    All other components within normal limits  LIPASE, BLOOD    EKG None  Radiology CT ABDOMEN PELVIS W CONTRAST  Result Date: 04/07/2022 CLINICAL DATA:  Sciatica 1 week now with nausea and vomiting as well as left lower quadrant pain. History of anal carcinoma and remote history of breast cancer. EXAM: CT ABDOMEN AND PELVIS WITH CONTRAST TECHNIQUE: Multidetector CT imaging of the abdomen and pelvis was performed using the standard protocol following bolus administration of intravenous contrast. RADIATION DOSE REDUCTION: This exam was performed according to the departmental dose-optimization program which includes automated exposure control, adjustment of the mA and/or kV according to patient size and/or use of iterative reconstruction technique. CONTRAST:  125m OMNIPAQUE IOHEXOL 300 MG/ML  SOLN COMPARISON:  07/09/2021 FINDINGS: Lower chest: Lung bases are clear. Hepatobiliary: Previous cholecystectomy. Liver and biliary  tree are normal. Pancreas: Normal. Spleen: Normal. Adrenals/Urinary Tract: Adrenal glands are normal. Kidneys are normal in size with a few bilateral cysts unchanged. Visualized ureters and bladder are unremarkable, although the distal ureters and portions of the bladder are obscured due to streak artifact from bilateral hip prostheses. Stomach/Bowel: Stomach and small bowel are normal. Appendix is normal. Colon is unremarkable. Likely post treatment changes over the anorectal junction which is stable. Vascular/Lymphatic: Mild calcified plaque over the abdominal aorta which is normal in caliber. Remaining vascular structures are unremarkable. No adenopathy. Reproductive: Previous hysterectomy.  Ovaries not visualized. Other: No free fluid or focal inflammatory change. Cluster of small ventral hernias over the midline above the umbilicus containing only peritoneal fat without significant change. Musculoskeletal: No focal abnormality. Degenerative changes of the spine. Bilateral hip arthroplasties unchanged. IMPRESSION: 1.  No acute findings in the abdomen/pelvis. 2. Bilateral renal cysts unchanged. 3. Cluster of small ventral hernias over the midline above the umbilicus containing only peritoneal fat without significant change. 4. Aortic atherosclerosis. Aortic Atherosclerosis (ICD10-I70.0). Electronically Signed   By: Marin Olp M.D.   On: 04/07/2022 11:16   DG Chest 2 View  Result Date: 04/07/2022 CLINICAL DATA:  Pt states she has been having some really bad sciatica pain for over a week. Pt now reports nausea, vomiting, and left lower abdominal pain for the past couple of days. EXAM: CHEST - 2 VIEW COMPARISON:  09/05/2016.  CT, 07/09/2021. FINDINGS: Cardiac silhouette is normal in size. No mediastinal or hilar masses. No evidence of adenopathy. Clear lungs.  No pleural effusion or pneumothorax. Bilateral mastectomies. Right shoulder arthroplasty, incompletely imaged, appearing well aligned. No acute skeletal  abnormality. IMPRESSION: No active cardiopulmonary disease. Electronically Signed   By: Lajean Manes M.D.   On: 04/07/2022 11:03    Procedures Procedures    Medications Ordered in ED Medications  sodium chloride (PF) 0.9 % injection (has no administration in time range)  morphine (PF) 4 MG/ML injection 4 mg (4 mg Intravenous Given 04/07/22 1028)  ondansetron (ZOFRAN) injection 4 mg (4 mg Intravenous Given 04/07/22 1026)  iohexol (OMNIPAQUE) 300 MG/ML solution 100 mL (100 mLs Intravenous Contrast Given 04/07/22 1051)    ED Course/ Medical Decision Making/ A&P                           Medical Decision Making Amount and/or Complexity of Data Reviewed Labs: ordered. Radiology: ordered.  Risk Prescription drug management.   This patient is a 71 y.o. female who presents to the ED for concern of recent worsening left hip pain, sciatica pain, this involves an extensive number of treatment options, and is a complaint that carries with it a high risk of complications and morbidity. The emergent differential diagnosis prior to evaluation includes, but is not limited to, new back compression fracture, concern for her abdominal pain of diverticulitis, intra-abdominal infection, mesenteric ischemia, other enteritis or colitis versus other..   This is not an exhaustive differential.   Past Medical History / Co-morbidities / Social History: Known osteoarthritis of bilateral hips, previous breast cancer, previous rectal cancer  Additional history: Chart reviewed. Pertinent results include: Reviewed previous lab work, imaging from recent emergency department visits, outpatient orthopedic visits, outpatient oncology visits  Physical Exam: Physical exam performed. The pertinent findings include: Patient with positive straight leg raise on right, she also has some tenderness palpation of the left lower quadrant.  No rebound, rigidity, guarding.  She is uncomfortable but otherwise well-appearing, no  acute distress  Lab Tests: I ordered, and personally interpreted labs.  The pertinent results include: Urinalysis overall unremarkable.  CMP with some global decreased protein and albumin, without other significant abnormalities.  Lipase normal.  CBC unremarkable.   Imaging Studies: I ordered imaging studies including CT abdomen pelvis with contrast, plain film chest x-ray. I independently visualized and interpreted imaging which showed Stable bilateral renal cysts, ventral hernia without evidence of incarceration or strangulation.  Otherwise unremarkable abdomen.  No significant spinal pathology noted on CT.  Plain film chest x-ray without acute intrathoracic disease.. I agree with the radiologist interpretation.  Medications: I ordered medication including morphine for pain, Zofran for nausea. Reevaluation of the patient after these medicines showed that the patient improved. I have reviewed the patients home medicines and have made adjustments as needed.  Disposition: After consideration of the diagnostic results and the patients response to treatment, I feel that Patient symptoms are consistent with severe left-sided sciatica, I think that her abdominal pain is referred from her back pain, and nausea and vomiting is due to the severity of the pain.  Patient just completed a course of steroids as well as has been taking gabapentin.  I would not recommend repeating her steroid courses soon after completing it.  At this time we will escalate her pain control with morphine tablets, robaxin, and encouraged neurosurgery follow up .   emergency department workup does not suggest an emergent condition requiring admission or immediate intervention beyond what has been performed at this time. The plan is: as above. The patient is safe for discharge and has been instructed to return immediately for worsening symptoms, change in symptoms or any other concerns.  I discussed this case with my attending  physician Dr. Eulis Foster who cosigned this note including patient's presenting symptoms, physical exam, and planned diagnostics and interventions. Attending physician stated agreement with plan or made changes to plan which were implemented.    Final Clinical Impression(s) / ED Diagnoses Final diagnoses:  Sciatica of left side    Rx / DC Orders ED Discharge Orders          Ordered    morphine (MSIR) 15 MG tablet  Every 6 hours PRN        04/07/22 1230    methocarbamol (ROBAXIN) 500 MG tablet  2 times daily        04/07/22 1230              Sahan Pen, Avery, PA-C 04/07/22 1248    Daleen Bo, MD 04/07/22 1749

## 2022-04-07 NOTE — Discharge Instructions (Addendum)
Please use Tylenol or ibuprofen for pain.  You may use 600 mg ibuprofen every 6 hours or 1000 mg of Tylenol every 6 hours.  You may choose to alternate between the 2.  This would be most effective.  Not to exceed 4 g of Tylenol within 24 hours.  Not to exceed 3200 mg ibuprofen 24 hours.  You can use the stronger narcotic pain medication in place of the Tylenol above for severe breakthrough pain.  You can use the muscle relaxant in addition to the above up to twice daily.  These medications may make you drowsy I recommend not taking them while you are driving or operating heavy machinery.  You have had a history of itching and rash with other narcotic pain medications in the past, you may want to take a small dose of Benadryl with your narcotic pain medication you are having any of the symptoms.

## 2022-04-08 ENCOUNTER — Other Ambulatory Visit: Payer: Self-pay

## 2022-04-09 DIAGNOSIS — M5416 Radiculopathy, lumbar region: Secondary | ICD-10-CM | POA: Diagnosis not present

## 2022-04-11 DIAGNOSIS — M5442 Lumbago with sciatica, left side: Secondary | ICD-10-CM | POA: Diagnosis not present

## 2022-04-12 ENCOUNTER — Other Ambulatory Visit: Payer: Self-pay | Admitting: Family Medicine

## 2022-04-12 DIAGNOSIS — M4727 Other spondylosis with radiculopathy, lumbosacral region: Secondary | ICD-10-CM | POA: Diagnosis not present

## 2022-04-12 DIAGNOSIS — M79605 Pain in left leg: Secondary | ICD-10-CM

## 2022-04-12 DIAGNOSIS — R292 Abnormal reflex: Secondary | ICD-10-CM

## 2022-04-12 DIAGNOSIS — R531 Weakness: Secondary | ICD-10-CM

## 2022-04-16 ENCOUNTER — Other Ambulatory Visit: Payer: Self-pay

## 2022-04-17 DIAGNOSIS — J040 Acute laryngitis: Secondary | ICD-10-CM | POA: Diagnosis not present

## 2022-04-18 ENCOUNTER — Ambulatory Visit
Admission: RE | Admit: 2022-04-18 | Discharge: 2022-04-18 | Disposition: A | Discharge status: Home / Self Care | Payer: PPO | Source: Ambulatory Visit | Attending: Family Medicine | Admitting: Family Medicine

## 2022-04-18 DIAGNOSIS — M48061 Spinal stenosis, lumbar region without neurogenic claudication: Secondary | ICD-10-CM | POA: Diagnosis not present

## 2022-04-18 DIAGNOSIS — R292 Abnormal reflex: Secondary | ICD-10-CM

## 2022-04-18 DIAGNOSIS — M4727 Other spondylosis with radiculopathy, lumbosacral region: Secondary | ICD-10-CM

## 2022-04-18 DIAGNOSIS — M545 Low back pain, unspecified: Secondary | ICD-10-CM | POA: Diagnosis not present

## 2022-04-18 DIAGNOSIS — M4126 Other idiopathic scoliosis, lumbar region: Secondary | ICD-10-CM | POA: Diagnosis not present

## 2022-04-18 DIAGNOSIS — M79605 Pain in left leg: Secondary | ICD-10-CM | POA: Diagnosis not present

## 2022-04-18 DIAGNOSIS — R531 Weakness: Secondary | ICD-10-CM

## 2022-04-22 DIAGNOSIS — M150 Primary generalized (osteo)arthritis: Secondary | ICD-10-CM | POA: Diagnosis not present

## 2022-04-22 DIAGNOSIS — K219 Gastro-esophageal reflux disease without esophagitis: Secondary | ICD-10-CM | POA: Diagnosis not present

## 2022-04-22 DIAGNOSIS — E7800 Pure hypercholesterolemia, unspecified: Secondary | ICD-10-CM | POA: Diagnosis not present

## 2022-04-22 DIAGNOSIS — I10 Essential (primary) hypertension: Secondary | ICD-10-CM | POA: Diagnosis not present

## 2022-04-25 ENCOUNTER — Other Ambulatory Visit: Payer: PPO

## 2022-04-26 DIAGNOSIS — Z23 Encounter for immunization: Secondary | ICD-10-CM | POA: Diagnosis not present

## 2022-04-26 DIAGNOSIS — Z0000 Encounter for general adult medical examination without abnormal findings: Secondary | ICD-10-CM | POA: Diagnosis not present

## 2022-04-26 DIAGNOSIS — Z1389 Encounter for screening for other disorder: Secondary | ICD-10-CM | POA: Diagnosis not present

## 2022-04-30 DIAGNOSIS — M5416 Radiculopathy, lumbar region: Secondary | ICD-10-CM | POA: Diagnosis not present

## 2022-05-01 DIAGNOSIS — R030 Elevated blood-pressure reading, without diagnosis of hypertension: Secondary | ICD-10-CM | POA: Diagnosis not present

## 2022-05-01 DIAGNOSIS — R519 Headache, unspecified: Secondary | ICD-10-CM | POA: Diagnosis not present

## 2022-05-08 DIAGNOSIS — E7800 Pure hypercholesterolemia, unspecified: Secondary | ICD-10-CM | POA: Diagnosis not present

## 2022-05-08 DIAGNOSIS — I10 Essential (primary) hypertension: Secondary | ICD-10-CM | POA: Diagnosis not present

## 2022-05-08 DIAGNOSIS — M4727 Other spondylosis with radiculopathy, lumbosacral region: Secondary | ICD-10-CM | POA: Diagnosis not present

## 2022-05-08 DIAGNOSIS — K5901 Slow transit constipation: Secondary | ICD-10-CM | POA: Diagnosis not present

## 2022-05-23 DIAGNOSIS — R221 Localized swelling, mass and lump, neck: Secondary | ICD-10-CM | POA: Diagnosis not present

## 2022-05-23 DIAGNOSIS — Z6836 Body mass index (BMI) 36.0-36.9, adult: Secondary | ICD-10-CM | POA: Diagnosis not present

## 2022-05-24 DIAGNOSIS — E041 Nontoxic single thyroid nodule: Secondary | ICD-10-CM | POA: Diagnosis not present

## 2022-05-24 DIAGNOSIS — R221 Localized swelling, mass and lump, neck: Secondary | ICD-10-CM | POA: Diagnosis not present

## 2022-05-27 DIAGNOSIS — M5416 Radiculopathy, lumbar region: Secondary | ICD-10-CM | POA: Diagnosis not present

## 2022-05-28 DIAGNOSIS — M5136 Other intervertebral disc degeneration, lumbar region: Secondary | ICD-10-CM | POA: Diagnosis not present

## 2022-05-28 DIAGNOSIS — M5451 Vertebrogenic low back pain: Secondary | ICD-10-CM | POA: Diagnosis not present

## 2022-05-28 DIAGNOSIS — M5416 Radiculopathy, lumbar region: Secondary | ICD-10-CM | POA: Diagnosis not present

## 2022-05-31 ENCOUNTER — Telehealth: Payer: Self-pay | Admitting: Hematology

## 2022-05-31 ENCOUNTER — Inpatient Hospital Stay: Payer: PPO | Attending: Hematology | Admitting: Hematology

## 2022-05-31 ENCOUNTER — Other Ambulatory Visit: Payer: Self-pay

## 2022-05-31 VITALS — BP 148/90 | HR 75 | Temp 98.8°F | Resp 17 | Ht 65.0 in | Wt 219.5 lb

## 2022-05-31 DIAGNOSIS — Z9013 Acquired absence of bilateral breasts and nipples: Secondary | ICD-10-CM | POA: Diagnosis not present

## 2022-05-31 DIAGNOSIS — R221 Localized swelling, mass and lump, neck: Secondary | ICD-10-CM

## 2022-05-31 DIAGNOSIS — C210 Malignant neoplasm of anus, unspecified: Secondary | ICD-10-CM | POA: Diagnosis not present

## 2022-05-31 DIAGNOSIS — Z853 Personal history of malignant neoplasm of breast: Secondary | ICD-10-CM | POA: Diagnosis not present

## 2022-05-31 DIAGNOSIS — Z923 Personal history of irradiation: Secondary | ICD-10-CM | POA: Insufficient documentation

## 2022-05-31 DIAGNOSIS — Z9221 Personal history of antineoplastic chemotherapy: Secondary | ICD-10-CM | POA: Diagnosis not present

## 2022-05-31 NOTE — Telephone Encounter (Signed)
Scheduled appt per 9/13 referral. Pt is aware.

## 2022-05-31 NOTE — Progress Notes (Signed)
Cynthia Velasquez   [phone] [phone]   Clinic Follow up Note   Patient Care Team: Kathyrn Lass, MD as PCP - General (Family Medicine) Wonda Horner, MD as Consulting Physician (Gastroenterology) Truitt Merle, MD as Consulting Physician (Hematology) Alla Feeling, NP as Nurse Practitioner (Nurse Practitioner) Kyung Rudd, MD as Consulting Physician (Radiation Oncology) Michael Boston, MD as Consulting Physician (General Surgery)  Date of Service:  05/31/2022  CHIEF COMPLAINT: right neck mass   CURRENT THERAPY:  Surveillance  ASSESSMENT & PLAN:  Dorismar Chay is a 71 y.o. female with   1. New neck lymphadenopathy -presented to PCP on 05/23/22 with new right neck lump and associated neck stiffness. It has been getting worse  -[name] on 05/24/22 showed: bilateral neck lymphadenopathy suspicious for metastatic disease or lymphoma/leukemia. -She has a large mobile cutaneous mass in the right upper neck, likely enlarged lymph nodes.  This is highly suspicious for malignancy, especially metastasis from previous anal or breast cancer, or new primary in head and neck.  Lymphoma is also a possibility. -I recommend ultrasound-guided biopsy as soon as possible.  I will schedule for her.  2. Anal Squamous Cell Carcinoma, cT2N0M0 -diagnosed in 05/2019. Workup showed a 3 cm mass at the anorectal junction, biopsy confirmed well to moderately differentiated squamous cell carcinoma. 06/21/19 PET scan showed no metastasis.  -She completed standard treatment with concurrent chemoRT with Mitomycin and 5FU on 08/09/19.  -post-treatment PET scan on 11/01/19 showed near complete response.   -surveillance CT CAP 07/09/21 was NED. -surveillance colonoscopy done for rectal bleeding on 12/07/21 showed only radiation changes. Her rectal bleeding has resolved. -she is now three years out from diagnosis.    3. H/o bilateral breast cancer, s/p mastectomies  -2007: s/p right lumpectomy for  ADH in 08/2006 showed DCIS ER/PR+, grade 1. 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 She was adopted, no known family history       PLAN: -CT neck with contrast in next week -Ultrasound-guided thyroid neck mass biopsy by IR as soon as possible -Follow-up 2 to 3 days after biopsy.  No problem-specific Assessment & Plan notes found for this encounter.   SUMMARY OF ONCOLOGIC HISTORY: Oncology History Overview Note  Cancer Staging Anal cancer ([place]) Staging form: Anus, AJCC 8th Edition - Clinical stage from 06/28/2019: Stage IIA (cT2, cN0, cM0) - Signed by Alla Feeling, NP on 06/28/2019  Breast cancer of upper-outer quadrant of left female breast [facility]) Staging form: Breast, AJCC 7th Edition - Clinical stage from 09/07/2013: Stage IIA (T2, N0, cM0) - Signed by [name], MD on 09/07/2013 - Pathologic: Stage IIA (T2, N0, cM0) - Signed by [name], MD on 09/07/2013  Breast cancer of upper-outer quadrant of right female breast [facility]) Staging form: Breast, AJCC 7th Edition - Clinical stage from 09/07/2013: Stage IA (T1a, N0, cM0) - Signed by [name], MD on 09/07/2013 - Pathologic: Stage IA (T1a, N0, cM0) - Signed by [name], MD on 09/07/2013    Breast cancer of upper-outer quadrant of left female breast ([place])  07/10/2007 Procedure   US biopsy showed invasive ductal carcinoma 0.8cm, Nottingham grade 3   07/30/2007 Surgery   She had left breast lumpectomy and LN biopsy which showed high grade invasive  ductal cancer Nottingham grade 3, 2.7 cm, ER/PR/Her2 neu negative   11/24/2007 Surgery   Patient elected for bilateral mastectomy   09/07/2008 - 03/08/2009 Chemotherapy   dates are approximate: she received  adriamycin and cytoxan followed by Taxol   03/08/2009 - 05/08/2009 Radiation Therapy   dates are approximate, she received XRT    07/09/2021 Imaging   CT CAP  IMPRESSION: 1. No evidence to suggest locally recurrent ianorectal neoplasm or metastatic disease in the chest, abdomen or pelvis. 2. Status post bilateral modified radical mastectomy and bilateral axillary lymph node dissection. 3. Three small supraumbilical ventral hernias containing only omental fat. No associated bowel incarceration or obstruction at this time. 4. Aortic atherosclerosis. 5. Additional incidental findings, as above.   Breast cancer of upper-outer quadrant of right female breast ([facility])  07/10/2006 Procedure   stereotactic biopsy showed atypical hyperplasia   10/23/2006 Surgery   right lumpectomy showed invasive ductal ca (Nottingham grade 1)  2mm and DCIS, ER/PR positive Her 2 negative   10/09/2007 - 10/08/2012 Chemotherapy   Patient was placed on Tamoxifen   03/24/2017 Imaging   No acute findings. No evidence of recurrent carcinoma or metastatic disease   07/09/2021 Imaging   CT CAP  IMPRESSION: 1. No evidence to suggest locally recurrent ianorectal neoplasm or metastatic disease in the chest, abdomen or pelvis. 2. Status post bilateral modified radical mastectomy and bilateral axillary lymph node dissection. 3. Three small supraumbilical ventral hernias containing only omental fat. No associated bowel incarceration or obstruction at this time. 4. Aortic atherosclerosis. 5. Additional incidental findings, as above.   Anal cancer ([facility])  06/04/2019 Procedure   Colonoscopy per Dr. Salem Ganem: Findings-the digital rectal exam revealed a 3 cm diameter firm rectal mass.  The mass was noncircumferential and located predominantly at the right bowel wall at the anorectal junction. A nonobstructing mass was found at the anus and in the rectum    06/04/2019 Initial Biopsy   Follow pathology: Large  intestine, rectum biopsy: Invasive well to moderately differentiated squamous cell carcinoma.  No rectal mucosa present.  There is strong diffuse expression of P 16 immunostain.  CDX 2, p63 and mCEA immunostains are also used in the diagnostic work-up of the case.   06/04/2019 Initial Diagnosis   Anal cancer (HCC)   06/21/2019 PET scan   IMPRESSION: 1. Anorectal primary. No hypermetabolic metastatic disease within the chest, abdomen, or pelvis. Perirectal nodes, at least 1 of which is new since 02/26/2018 CT, suspicious based on size and interval development. 2. Mild limitations secondary to beam hardening artifact from bilateral hip arthroplasty. 3.  Aortic Atherosclerosis (ICD10-I70.0).   06/28/2019 Cancer Staging   Staging form: Anus, AJCC 8th Edition - Clinical stage from 06/28/2019: Stage IIA (cT2, cN0, cM0) - Signed by Burton, Lacie K, NP on 06/28/2019   06/28/2019 - 07/26/2019 Chemotherapy   concurrent chemoRT with Mitomycin and 5FU on week 1 and week 5 on starting 06/28/19. Last dose on 07/26/19   06/28/2019 - 08/09/2019 Radiation Therapy   concurrent chemoRT with Dr Moody 06/28/19-08/09/19   11/01/2019 PET scan   IMPRESSION: 1. Marked interval decrease in hypermetabolism noted at the level of the anal rectal primary. No evidence for hypermetabolic metastatic disease in the chest, abdomen, or pelvis. The perirectal lymph nodes identified previously have resolved in the interval. 2.  Aortic Atherosclerois (ICD10-170.0)   08/07/2020 Imaging   CT CAP  IMPRESSION: 1. No findings for residual/recurrent anal tumor, regional lymphadenopathy or metastatic disease. 2. Status post cholecystectomy.   No biliary dilatation. 3. Small anterior abdominal wall hernia containing fat. 4. Aortic atherosclerosis.   Aortic Atherosclerosis (ICD10-I70.0).     07/09/2021 Imaging   CT CAP  IMPRESSION: 1. No evidence to suggest locally recurrent ianorectal neoplasm or metastatic disease in the  chest, abdomen or pelvis. 2. Status post bilateral modified radical mastectomy and bilateral axillary lymph node dissection. 3. Three small supraumbilical ventral hernias containing only omental fat. No associated bowel incarceration or obstruction at this time. 4. Aortic atherosclerosis. 5. Additional incidental findings, as above.      INTERVAL HISTORY:  Siearra Greif is here for a follow up of recent abnormal neck US. She was last seen by me on 03/22/22. She presents to the clinic alone.  She was referred back by her PCP.  She reports a growing mass in the right upper neck in the past 2 weeks, did have an episode of mild hoarseness a few weeks prior to that, and resolved spontaneously.  She otherwise denies fever, chills, dysphagia, pain, or other new symptoms.   All other systems were reviewed with the patient and are negative.  MEDICAL HISTORY:  Past Medical History:  Diagnosis Date   Anal cancer (HCC) dx'd 05/2019   Anxiety    Arthritis    Bilateral breast cancer (HCC) 10/21/2013   Breast cancer (HCC)    Cancer (HCC)    Depression    Gallstones    GERD (gastroesophageal reflux disease)    doesn't take any meds for this   Headache    h/o migraines       History of bladder infections    History of hiatal hernia    History of migraine    Hyperlipidemia    takes Simvastatin daily   Hypertension    takes Hyzaar   Joint pain    Joint swelling    Lumbar stenosis    Neuropathy 09/07/2013   Pneumonia    Pre-diabetes     SURGICAL HISTORY: Past Surgical History:  Procedure Laterality Date   ABDOMINAL HYSTERECTOMY     bone spur removed from left foot     CHOLECYSTECTOMY     COLONOSCOPY     COLONOSCOPY WITH PROPOFOL Bilateral 12/07/2021   Procedure: COLONOSCOPY WITH PROPOFOL;  Surgeon: Magod, Marc, MD;  Location: [facility];  Service: Endoscopy;  Laterality: Bilateral;   double mastectomy      ESOPHAGOGASTRODUODENOSCOPY N/A 12/07/2021   Procedure:  ESOPHAGOGASTRODUODENOSCOPY (EGD);  Surgeon: Magod, Marc, MD;  Location: [facility];  Service: Endoscopy;  Laterality: N/A;   HERNIA REPAIR     umbilical   right knee arthroscopy     right shoulder arthroscopy     surgery for hiatal hernia     TONSILLECTOMY     TOTAL HIP ARTHROPLASTY Left 02/12/2013   TOTAL HIP ARTHROPLASTY Left 02/12/2013   Procedure: LEFT TOTAL HIP ARTHROPLASTY;  Surgeon: Frank J Rowan, MD;  Location: [facility];  Service: Orthopedics;  Laterality: Left;   TOTAL HIP ARTHROPLASTY Right 05/03/2013   Procedure: TOTAL HIP ARTHROPLASTY;  Surgeon: Frank J Rowan, MD;  Location: [facility];  Service: Orthopedics;  Laterality: Right;   TOTAL KNEE ARTHROPLASTY Right 02/27/2015   Procedure: TOTAL KNEE ARTHROPLASTY;  Surgeon: Frank Rowan, MD;  Location: [facility];  Service: Orthopedics;  Laterality: Right;   TOTAL SHOULDER ARTHROPLASTY Right 09/12/2016   Procedure: RIGHT TOTAL SHOULDER ARTHROPLASTY;  Surgeon: Justin Chandler, MD;  Location: [facility] OR;  Service: Orthopedics;  Laterality: Right;  RIGHT TOTAL SHOULDER ARTHROPLASTY    I   have reviewed the social history and family history with the patient and they are unchanged from previous note.  ALLERGIES:  is allergic to oxycodone-acetaminophen, codeine, and vicodin [hydrocodone-acetaminophen].  MEDICATIONS:  Current Outpatient Medications  Medication Sig Dispense Refill   aspirin-acetaminophen-caffeine (EXCEDRIN MIGRAINE) 250-250-65 MG tablet Take 2 tablets by mouth every 6 (six) hours as needed for headache.     LINZESS 72 MCG capsule Take 72 mcg by mouth daily as needed (constipation).     losartan (COZAAR) 100 MG tablet Take 100 mg by mouth daily.  6   meclizine (ANTIVERT) 12.5 MG tablet Take 1 tablet (12.5 mg total) by mouth 3 (three) times daily as needed for dizziness. 30 tablet 0   Menthol-Methyl Salicylate (SALONPAS PAIN RELIEF PATCH EX) Apply 1 patch topically daily as needed (pain).     methocarbamol (ROBAXIN) 500 MG tablet Take 1 tablet  (500 mg total) by mouth 2 (two) times daily. 20 tablet 0   morphine (MSIR) 15 MG tablet Take 0.5 tablets (7.5 mg total) by mouth every 6 (six) hours as needed for severe pain. 8 tablet 0   nystatin (MYCOSTATIN/NYSTOP) powder Apply 1 Application topically 3 (three) times daily. 30 g 2   rosuvastatin (CRESTOR) 40 MG tablet Take 40 mg by mouth daily.     No current facility-administered medications for this visit.    PHYSICAL EXAMINATION: ECOG PERFORMANCE STATUS: 0 - Asymptomatic  There were no vitals filed for this visit. Wt Readings from Last 3 Encounters:  04/07/22 218 lb (98.9 kg)  03/22/22 218 lb 3.2 oz (99 kg)  12/07/21 223 lb (101.2 kg)     GENERAL:alert, no distress and comfortable SKIN: skin color, texture, turgor are normal, no rashes or significant lesions EYES: normal, Conjunctiva are pink and non-injected, sclera clear OROPHARYNX:no exudate, no erythema and lips, buccal mucosa, and tongue normal NECK: supple, thyroid normal size, non-tender, without nodularity.  There is a two 2 x 4 cm subcutaneous mass in the right upper neck, movable.  No skin change. LYMPH:  no other palpable lymphadenopathy in the cervical, axillary.  LUNGS: clear to auscultation and percussion with normal breathing effort HEART: regular rate & rhythm and no murmurs and no lower extremity edema ABDOMEN:abdomen soft, non-tender and normal bowel sounds Musculoskeletal:no cyanosis of digits and no clubbing  NEURO: alert & oriented x 3 with fluent speech, no focal motor/sensory deficits  LABORATORY DATA:  I have reviewed the data as listed    Latest Ref Rng & Units 04/07/2022    9:28 AM 03/22/2022    7:22 AM 11/20/2021    8:36 AM  CBC  WBC 4.0 - 10.5 K/uL 5.1  4.5  4.5   Hemoglobin 12.0 - 15.0 g/dL 13.3  13.1  12.5   Hematocrit 36.0 - 46.0 % 42.2  40.5  39.4   Platelets 150 - 400 K/uL 261  220  242         Latest Ref Rng & Units 04/07/2022    9:28 AM 03/22/2022    7:22 AM 11/20/2021    8:36 AM  CMP   Glucose 70 - 99 mg/dL 96  106  95   BUN 8 - 23 mg/dL 15  15  17   Creatinine 0.44 - 1.00 mg/dL 0.97  1.03  0.94   Sodium 135 - 145 mmol/L 141  141  142   Potassium 3.5 - 5.1 mmol/L 3.6  4.0  4.2   Chloride 98 - 111 mmol/L 107  106  109     CO2 22 - 32 mmol/L 28  29  28   Calcium 8.9 - 10.3 mg/dL 8.9  9.6  9.4   Total Protein 6.5 - 8.1 g/dL 6.4  7.3  7.1   Total Bilirubin 0.3 - 1.2 mg/dL 0.5  0.3  0.3   Alkaline Phos 38 - 126 U/L 83  89  84   AST 15 - 41 U/L 13  16  13   ALT 0 - 44 U/L 17  15  9       RADIOGRAPHIC STUDIES: I have personally reviewed the radiological images as listed and agreed with the findings in the report. No results found.    Orders Placed This Encounter  Procedures   CT Soft Tissue Neck W Contrast    Large node in right upper neck, rule out primary head and neck cancer. Pt had anal cancer in 2020    Standing Status:   Future    Standing Expiration Date:   05/31/2023    Order Specific Question:   If indicated for the ordered procedure, I authorize the administration of contrast media per Radiology protocol    Answer:   Yes    Order Specific Question:   Preferred imaging location?    Answer:   [facility]   US CORE BIOPSY (LYMPH NODES)    Standing Status:   Future    Standing Expiration Date:   06/01/2023    Order Specific Question:   Lab orders requested (DO NOT place separate lab orders, these will be automatically ordered during procedure specimen collection):    Answer:   Surgical Pathology    Order Specific Question:   Reason for Exam (SYMPTOM  OR DIAGNOSIS REQUIRED)    Answer:   right upper neck mass    Order Specific Question:   Preferred location?    Answer:   [facility]   All questions were answered. The patient knows to call the clinic with any problems, questions or concerns. No barriers to learning was detected. The total time spent in the appointment was 30 minutes.      , MD 05/31/2022   I, Katie Daubenspeck, am  acting as scribe for  , MD.   I have reviewed the above documentation for accuracy and completeness, and I agree with the above.     

## 2022-06-02 ENCOUNTER — Encounter: Payer: Self-pay | Admitting: Hematology

## 2022-06-04 ENCOUNTER — Other Ambulatory Visit: Payer: Self-pay

## 2022-06-04 ENCOUNTER — Ambulatory Visit (HOSPITAL_COMMUNITY)
Admission: RE | Admit: 2022-06-04 | Discharge: 2022-06-04 | Disposition: A | Discharge status: Home / Self Care | Payer: PPO | Source: Ambulatory Visit | Attending: Hematology | Admitting: Hematology

## 2022-06-04 DIAGNOSIS — R221 Localized swelling, mass and lump, neck: Secondary | ICD-10-CM | POA: Diagnosis not present

## 2022-06-04 LAB — POCT I-STAT CREATININE: Creatinine, Ser: 1.1 mg/dL — ABNORMAL HIGH (ref 0.44–1.00)

## 2022-06-04 MED ORDER — IOHEXOL 300 MG/ML  SOLN
80.0000 mL | Freq: Once | INTRAMUSCULAR | Status: AC | PRN
  Administered 2022-06-04: 80 mL via INTRAVENOUS

## 2022-06-04 NOTE — Progress Notes (Unsigned)
Cynthia Daft, MD  [name] for US guided core biopsy.  Sedation is optional and per patient request.  See [name] images from [name].   [place]

## 2022-06-05 ENCOUNTER — Telehealth: Payer: Self-pay

## 2022-06-05 ENCOUNTER — Encounter: Payer: Self-pay | Admitting: Hematology

## 2022-06-05 NOTE — Telephone Encounter (Signed)
Called patient regarding her my chart question regarding creatinine value from yesterdays I-Stat prior to CT scan. Explained what creatinine was and how it is related to kidney function. All questions answered.

## 2022-06-07 ENCOUNTER — Other Ambulatory Visit: Payer: Self-pay | Admitting: Radiology

## 2022-06-07 ENCOUNTER — Encounter: Payer: Self-pay | Admitting: Hematology

## 2022-06-07 DIAGNOSIS — R221 Localized swelling, mass and lump, neck: Secondary | ICD-10-CM

## 2022-06-10 ENCOUNTER — Ambulatory Visit (HOSPITAL_COMMUNITY)
Admission: RE | Admit: 2022-06-10 | Discharge: 2022-06-10 | Disposition: A | Discharge status: Home / Self Care | Payer: PPO | Source: Ambulatory Visit | Attending: Hematology | Admitting: Hematology

## 2022-06-10 ENCOUNTER — Other Ambulatory Visit: Payer: Self-pay

## 2022-06-10 ENCOUNTER — Encounter (HOSPITAL_COMMUNITY): Payer: Self-pay

## 2022-06-10 DIAGNOSIS — R591 Generalized enlarged lymph nodes: Secondary | ICD-10-CM | POA: Diagnosis not present

## 2022-06-10 DIAGNOSIS — Z853 Personal history of malignant neoplasm of breast: Secondary | ICD-10-CM | POA: Insufficient documentation

## 2022-06-10 DIAGNOSIS — D47Z9 Other specified neoplasms of uncertain behavior of lymphoid, hematopoietic and related tissue: Secondary | ICD-10-CM | POA: Diagnosis not present

## 2022-06-10 DIAGNOSIS — Z85048 Personal history of other malignant neoplasm of rectum, rectosigmoid junction, and anus: Secondary | ICD-10-CM | POA: Insufficient documentation

## 2022-06-10 DIAGNOSIS — D47Z1 Post-transplant lymphoproliferative disorder (PTLD): Secondary | ICD-10-CM | POA: Diagnosis not present

## 2022-06-10 DIAGNOSIS — Z9013 Acquired absence of bilateral breasts and nipples: Secondary | ICD-10-CM | POA: Diagnosis not present

## 2022-06-10 DIAGNOSIS — R221 Localized swelling, mass and lump, neck: Secondary | ICD-10-CM | POA: Diagnosis not present

## 2022-06-10 LAB — CBC WITH DIFFERENTIAL/PLATELET
Abs Immature Granulocytes: 0 10*3/uL (ref 0.00–0.07)
Basophils Absolute: 0 10*3/uL (ref 0.0–0.1)
Basophils Relative: 0 %
Eosinophils Absolute: 0.1 10*3/uL (ref 0.0–0.5)
Eosinophils Relative: 2 %
HCT: 42.5 % (ref 36.0–46.0)
Hemoglobin: 13.4 g/dL (ref 12.0–15.0)
Immature Granulocytes: 0 %
Lymphocytes Relative: 33 %
Lymphs Abs: 1.2 10*3/uL (ref 0.7–4.0)
MCH: 27.5 pg (ref 26.0–34.0)
MCHC: 31.5 g/dL (ref 30.0–36.0)
MCV: 87.3 fL (ref 80.0–100.0)
Monocytes Absolute: 0.4 10*3/uL (ref 0.1–1.0)
Monocytes Relative: 11 %
Neutro Abs: 2 10*3/uL (ref 1.7–7.7)
Neutrophils Relative %: 54 %
Platelets: 203 10*3/uL (ref 150–400)
RBC: 4.87 MIL/uL (ref 3.87–5.11)
RDW: 14.6 % (ref 11.5–15.5)
WBC: 3.7 10*3/uL — ABNORMAL LOW (ref 4.0–10.5)
nRBC: 0 % (ref 0.0–0.2)

## 2022-06-10 LAB — BASIC METABOLIC PANEL
Anion gap: 8 (ref 5–15)
BUN: 14 mg/dL (ref 8–23)
CO2: 24 mmol/L (ref 22–32)
Calcium: 9.5 mg/dL (ref 8.9–10.3)
Chloride: 109 mmol/L (ref 98–111)
Creatinine, Ser: 0.76 mg/dL (ref 0.44–1.00)
GFR, Estimated: >60 mL/min (ref >60)
Glucose, Bld: 93 mg/dL (ref 70–99)
Potassium: 3.2 mmol/L — ABNORMAL LOW (ref 3.5–5.1)
Sodium: 141 mmol/L (ref 135–145)

## 2022-06-10 LAB — PROTIME-INR
INR: 1 (ref 0.8–1.2)
Prothrombin Time: 13.5 seconds (ref 11.4–15.2)

## 2022-06-10 MED ORDER — SODIUM CHLORIDE 0.9 % IV SOLN: INTRAVENOUS | Status: DC

## 2022-06-10 MED ORDER — FENTANYL CITRATE (PF) 100 MCG/2ML IJ SOLN
INTRAMUSCULAR | Status: AC | PRN
  Administered 2022-06-10: 50 ug via INTRAVENOUS

## 2022-06-10 MED ORDER — MIDAZOLAM HCL 2 MG/2ML IJ SOLN
INTRAMUSCULAR | Status: AC
  Filled 2022-06-10: qty 4

## 2022-06-10 MED ORDER — FENTANYL CITRATE (PF) 100 MCG/2ML IJ SOLN
INTRAMUSCULAR | Status: AC
  Filled 2022-06-10: qty 4

## 2022-06-10 MED ORDER — MIDAZOLAM HCL 2 MG/2ML IJ SOLN
INTRAMUSCULAR | Status: AC | PRN
  Administered 2022-06-10: 1 mg via INTRAVENOUS

## 2022-06-10 MED ORDER — LIDOCAINE HCL 1 % IJ SOLN
INTRAMUSCULAR | Status: AC
  Administered 2022-06-10: 10 mL
  Filled 2022-06-10: qty 20

## 2022-06-10 NOTE — H&P (Signed)
Chief Complaint: Patient was seen in consultation today for lymph node biopsy  Referring Physician(s): [name],[name]  Supervising Physician: Mir, Sharen Heck  Patient Status: [facility] - Out-pt  History of Present Illness: Cynthia Velasquez is a 71 y.o. female with a medical history significant for bilateral breast cancer (diagnosed in 2007 and s/p bilateral mastectomies, chemotherapy and radiation therapy) and anal cancer diagnosed in 2020 s/p chemotherapy. She presented to her PCP 05/23/22 with a new right neck lump and stiffness. Imaging is concerning for metastatic disease or lymphoma/leukemia.   CT Soft Tissue Neck with contrast 06/04/22 Lymph nodes: Enlarged lymph nodes right neck. Lymph node just below the parotid gland measures 21 x 25 mm. Below this and superficial to the sternocleidomastoid muscle is a lymph node measuring 12.5 mm. Right level 2 lymph node 10 mm. Small right supraclavicular lymph nodes measuring 10 mm. IMPRESSION: Multiple enlarged lymph nodes in the right neck. Recommend tissue sampling to rule out metastatic disease or lymphoma. No pharyngeal mass.  Interventional Radiology has been asked to evaluate this patient for an image-guided lymph node biopsy for further work up. Imaging reviewed and procedure approved by Dr. Anselm Pancoast.   Past Medical History:  Diagnosis Date   Anal cancer ([place]) dx'd 05/2019   Anxiety    Arthritis    Bilateral breast cancer ([place]) 10/21/2013   Breast cancer ([place])    Cancer ([place])    Depression    Gallstones    GERD (gastroesophageal reflux disease)    doesn't take any meds for this   Headache    h/o migraines       History of bladder infections    History of hiatal hernia    History of migraine    Hyperlipidemia    takes Simvastatin daily   Hypertension    takes Hyzaar   Joint pain    Joint swelling    Lumbar stenosis    Neuropathy 09/07/2013   Pneumonia    Pre-diabetes     Past Surgical History:  Procedure Laterality Date    ABDOMINAL HYSTERECTOMY     bone spur removed from left foot     CHOLECYSTECTOMY     COLONOSCOPY     COLONOSCOPY WITH PROPOFOL Bilateral 12/07/2021   Procedure: COLONOSCOPY WITH PROPOFOL;  Surgeon: Clarene Essex, MD;  Location: [facility];  Service: Endoscopy;  Laterality: Bilateral;   double mastectomy      ESOPHAGOGASTRODUODENOSCOPY N/A 12/07/2021   Procedure: ESOPHAGOGASTRODUODENOSCOPY (EGD);  Surgeon: Clarene Essex, MD;  Location: [facility];  Service: Endoscopy;  Laterality: N/A;   HERNIA REPAIR     umbilical   right knee arthroscopy     right shoulder arthroscopy     surgery for hiatal hernia     TONSILLECTOMY     TOTAL HIP ARTHROPLASTY Left 02/12/2013   TOTAL HIP ARTHROPLASTY Left 02/12/2013   Procedure: LEFT TOTAL HIP ARTHROPLASTY;  Surgeon: Kerin Salen, MD;  Location: [facility];  Service: Orthopedics;  Laterality: Left;   TOTAL HIP ARTHROPLASTY Right 05/03/2013   Procedure: TOTAL HIP ARTHROPLASTY;  Surgeon: Kerin Salen, MD;  Location: [facility];  Service: Orthopedics;  Laterality: Right;   TOTAL KNEE ARTHROPLASTY Right 02/27/2015   Procedure: TOTAL KNEE ARTHROPLASTY;  Surgeon: Frederik Pear, MD;  Location: [place];  Service: Orthopedics;  Laterality: Right;   TOTAL SHOULDER ARTHROPLASTY Right 09/12/2016   Procedure: RIGHT TOTAL SHOULDER ARTHROPLASTY;  Surgeon: Tania Ade, MD;  Location: [place];  Service: Orthopedics;  Laterality: Right;  RIGHT TOTAL SHOULDER ARTHROPLASTY  Allergies: Oxycodone-acetaminophen, Tramadol, Codeine, and Vicodin [hydrocodone-acetaminophen]  Medications: Prior to Admission medications   Medication Sig Start Date End Date Taking? Authorizing Provider  aspirin-acetaminophen-caffeine (EXCEDRIN MIGRAINE) [phone] MG tablet Take 2 tablets by mouth every 6 (six) hours as needed for headache.    [provider]  LINZESS 72 MCG capsule Take 72 mcg by mouth daily as needed (constipation). 06/04/19   [provider]  losartan (COZAAR) 100 MG tablet  Take 100 mg by mouth daily. 01/14/17   [provider]  meclizine (ANTIVERT) 12.5 MG tablet Take 1 tablet (12.5 mg total) by mouth 3 (three) times daily as needed for dizziness. 10/27/21   Hayden Rasmussen, MD  Menthol-Methyl Salicylate [facility] PAIN RELIEF PATCH EX) Apply 1 patch topically daily as needed (pain).    [provider]  rosuvastatin (CRESTOR) 40 MG tablet Take 40 mg by mouth daily. 07/30/19   [provider]     Family History  Adopted: Yes  Problem Relation Age of Onset   Allergic rhinitis Neg Hx    Angioedema Neg Hx    Atopy Neg Hx    Eczema Neg Hx    Immunodeficiency Neg Hx    Urticaria Neg Hx    Asthma Neg Hx     Social History   Socioeconomic History   Marital status: Single    Spouse name: Not on file   Number of children: 1   Years of education: Not on file   Highest education level: Not on file  Occupational History   Not on file  Tobacco Use   Smoking status: Never   Smokeless tobacco: Never  Vaping Use   Vaping Use: Never used  Substance and Sexual Activity   Alcohol use: No   Drug use: No   Sexual activity: Not Currently  Other Topics Concern   Not on file  Social History Narrative   Not on file   Social Determinants of Health   Financial Resource Strain: Not on file  Food Insecurity: Not on file  Transportation Needs: No Transportation Needs (06/17/2019)   PRAPARE - Transportation    Lack of Transportation (Medical): No    Lack of Transportation (Non-Medical): No  Physical Activity: Not on file  Stress: Not on file  Social Connections: Not on file    Review of Systems: A 12 point ROS discussed and pertinent positives are indicated in the HPI above.  All other systems are negative.  Review of Systems  Constitutional:  Negative for appetite change and fatigue.  Respiratory:  Negative for cough and shortness of breath.   Cardiovascular:  Negative for chest pain and leg swelling.  Gastrointestinal:  Negative  for abdominal pain, diarrhea, nausea and vomiting.  Neurological:  Negative for dizziness and headaches.  Hematological:  Positive for adenopathy.    Vital Signs: BP (!) 149/95 (BP Location: Right Arm)   Pulse 64   Temp 97.7 F (36.5 C) (Oral)   Resp 16   Ht '5\' 5"'$  (1.651 m)   Wt 217 lb (98.4 kg)   SpO2 97%   BMI 36.11 kg/m   Physical Exam Constitutional:      General: She is not in acute distress.    Appearance: She is not ill-appearing.  HENT:     Mouth/Throat:     Mouth: Mucous membranes are moist.     Pharynx: Oropharynx is clear.  Pulmonary:     Effort: Pulmonary effort is normal.     Breath sounds: Normal breath sounds.  Abdominal:     General: Bowel sounds are normal.     Palpations: Abdomen is soft.  Musculoskeletal:     Right lower leg: Edema present.     Left lower leg: Edema present.     Comments: Bilateral mastectomies  Lymphadenopathy:     Cervical: Cervical adenopathy present.  Skin:    General: Skin is warm and dry.  Neurological:     Mental Status: She is alert and oriented to person, place, and time.     Imaging: CT Soft Tissue Neck W Contrast  Result Date: 06/06/2022 CLINICAL DATA:  Right neck mass non pulsatile EXAM: CT NECK WITH CONTRAST TECHNIQUE: Multidetector CT imaging of the neck was performed using the standard protocol following the bolus administration of intravenous contrast. RADIATION DOSE REDUCTION: This exam was performed according to the departmental dose-optimization program which includes automated exposure control, adjustment of the mA and/or kV according to patient size and/or use of iterative reconstruction technique. CONTRAST:  42m OMNIPAQUE IOHEXOL 300 MG/ML  SOLN COMPARISON:  None Available. FINDINGS: Pharynx and larynx: Normal. No mass or swelling. Salivary glands: No inflammation, mass, or stone. Thyroid: 11 mm right thyroid nodule. No further imaging necessary (ref: J Am Coll Radiol. 2015 Feb;12(2): 143-50). Lymph nodes:  Enlarged lymph nodes right neck. Lymph node just below the parotid gland measures 21 x 25 mm. Below this and superficial to the sternocleidomastoid muscle is a lymph node measuring 12.5 mm. Right level 2 lymph node 10 mm. Small right supraclavicular lymph nodes measuring 10 mm. 10 mm lymph node posterior to the left sternocleidomastoid muscle below the hyoid bone. Vascular: Normal vascular enhancement Limited intracranial: Negative Visualized orbits: Negative Mastoids and visualized paranasal sinuses: Mild mucosal edema left maxillary sinus. Remaining sinuses clear Skeleton: Cervical spondylosis.  No acute abnormality. Upper chest: Lung apices clear bilaterally Other: None IMPRESSION: Multiple enlarged lymph nodes in the right neck. Recommend tissue sampling to rule out metastatic disease or lymphoma. No pharyngeal mass. Electronically Signed   By: CFranchot GalloM.D.   On: 06/06/2022 15:38    Labs:  CBC: Recent Labs    11/20/21 0836 03/22/22 0722 04/07/22 0928 06/10/22 1130  WBC 4.5 4.5 5.1 3.7*  HGB 12.5 13.1 13.3 13.4  HCT 39.4 40.5 42.2 42.5  PLT 242 220 261 203    COAGS: No results for input(s): "INR", "APTT" in the last 8760 hours.  BMP: Recent Labs    10/27/21 1324 11/20/21 0836 03/22/22 0722 04/07/22 0928 06/04/22 1732  NA 138 142 141 141  --   K 4.0 4.2 4.0 3.6  --   CL 105 109 106 107  --   CO2 '28 28 29 28  '$ --   GLUCOSE 102* 95 106* 96  --   BUN '12 17 15 15  '$ --   CALCIUM 9.3 9.4 9.6 8.9  --   CREATININE 0.83 0.94 1.03* 0.97 1.10*  GFRNONAA >60 >60 58* >60  --     LIVER FUNCTION TESTS: Recent Labs    07/20/21 0834 11/20/21 0836 03/22/22 0722 04/07/22 0928  BILITOT 0.4 0.3 0.3 0.5  AST 15 13* 16 13*  ALT '11 9 15 17  '$ ALKPHOS 93 84 89 83  PROT 6.9 7.1 7.3 6.4*  ALBUMIN 3.4* 3.8 4.0 3.3*    TUMOR MARKERS: No results for input(s): "AFPTM", "CEA", "CA199", "CHROMGRNA" in the last 8760 hours.  Assessment and Plan:  History of breast and anal cancer; new  lymphadenopathy concerning for metastatic disease versus lymphoma/leukemia: Cynthia  Gerilyn Velasquez, 71 year old female, presents today to the [facility] department for an image-guided lymph node biopsy.  Risks and benefits of this procedure were discussed with the patient and/or patient's family including, but not limited to bleeding, infection, damage to adjacent structures or low yield requiring additional tests.  All of the questions were answered and there is agreement to proceed. She has been NPO.   Consent signed and in chart.  Thank you for this interesting consult.  I greatly enjoyed meeting Cynthia Velasquez and look forward to participating in their care.  A copy of this report was sent to the requesting provider on this date.  Electronically Signed: Soyla Dryer, AGACNP-BC [phone] 06/10/2022, 12:22 PM   I spent a total of  30 Minutes   in face to face in clinical consultation, greater than 50% of which was counseling/coordinating care for lymph node biopsy

## 2022-06-10 NOTE — Discharge Instructions (Signed)
Needle Biopsy, Care After This sheet gives you information about how to care for yourself after your procedure. Your health care provider may also give you more specific instructions. If you have problems or questions, contact your health careprovider. What can I expect after the procedure? After the procedure, it is common to have soreness, bruising, or mild pain atthe puncture site. This should go away in a few days. Follow these instructions at home: Needle insertion site care Wash your hands with soap and water before you change your bandage (dressing). If you cannot use soap and water, use hand sanitizer. Follow instructions from your health care provider about how to take care of your puncture site. This includes: When and how to change your dressing. When to remove your dressing. Check your puncture site every day for signs of infection. Check for: Redness, swelling, or pain. Fluid or blood. Pus or a bad smell. Warmth.   General instructions Return to your normal activities as told by your health care provider. Ask your health care provider what activities are safe for you. Do not take baths, swim, or use a hot tub until your health care provider approves. Ask your health care provider if you may take showers. You may only be allowed to take sponge baths. Take over-the-counter and prescription medicines only as told by your health care provider. Keep all follow-up visits as told by your health care provider. This is important. Contact a health care provider if: You have a fever. You have redness, swelling, or pain at the puncture site that lasts longer than a few days. You have fluid, blood, or pus coming from your puncture site. Your puncture site feels warm to the touch. Get help right away if: You have severe bleeding from the puncture site. Summary After the procedure, it is common to have soreness, bruising, or mild pain at the puncture site. This should go away in a few  days. Check your puncture site every day for signs of infection, such as redness, swelling, or pain. Get help right away if you have severe bleeding from your puncture site. This information is not intended to replace advice given to you by your health care provider. Make sure you discuss any questions you have with your healthcare provider. Document Revised: 03/02/2020 Document Reviewed: 03/02/2020 Elsevier Patient Education  [place].      Moderate Conscious Sedation, Adult, Care After This sheet gives you information about how to care for yourself after your procedure. Your health care provider may also give you more specific instructions. If you have problems or questions, contact your health care provider. What can I expect after the procedure? After the procedure, it is common to have: Sleepiness for several hours. Impaired judgment for several hours. Difficulty with balance. Vomiting if you eat too soon. Follow these instructions at home: For the time period you were told by your health care provider:     Rest. Do not participate in activities where you could fall or become injured. Do not drive or use machinery. Do not drink alcohol. Do not take sleeping pills or medicines that cause drowsiness. Do not make important decisions or sign legal documents. Do not take care of children on your own. Eating and drinking For questions /concerns may call   Interventional Radiology clinic [phone]   You may remove your dressing and shower tomorrow afternoon   Follow the diet recommended by your health care provider. Drink enough fluid to keep your urine pale yellow. If  you vomit: Drink water, juice, or soup when you can drink without vomiting. Make sure you have little or no nausea before eating solid foods. General instructions Take over-the-counter and prescription medicines only as told by your health care provider. Have a responsible adult stay with you for the  time you are told. It is important to have someone help care for you until you are awake and alert. Do not smoke. Keep all follow-up visits as told by your health care provider. This is important. Contact a health care provider if: You are still sleepy or having trouble with balance after 24 hours. You feel light-headed. You keep feeling nauseous or you keep vomiting. You develop a rash. You have a fever. You have redness or swelling around the IV site. Get help right away if: You have trouble breathing. You have new-onset confusion at home. Summary After the procedure, it is common to feel sleepy, have impaired judgment, or feel nauseous if you eat too soon. Rest after you get home. Know the things you should not do after the procedure. Follow the diet recommended by your health care provider and drink enough fluid to keep your urine pale yellow. Get help right away if you have trouble breathing or new-onset confusion at home. This information is not intended to replace advice given to you by your health care provider. Make sure you discuss any questions you have with your health care provider. Document Revised: 12/31/2019 Document Reviewed: 07/29/2019 Elsevier Patient Education  [place].

## 2022-06-10 NOTE — Procedures (Signed)
  Procedure:  [name] core R cervical LAN  Preprocedure diagnosis: The encounter diagnosis was Mass of right side of neck.  Postprocedure diagnosis: same EBL:    minimal Complications:   none immediate  See full dictation in [employer].  Dillard Cannon MD Main # [phone] Pager  [phone] Mobile [phone]

## 2022-06-12 DIAGNOSIS — R238 Other skin changes: Secondary | ICD-10-CM | POA: Diagnosis not present

## 2022-06-12 DIAGNOSIS — M5416 Radiculopathy, lumbar region: Secondary | ICD-10-CM | POA: Diagnosis not present

## 2022-06-13 ENCOUNTER — Inpatient Hospital Stay (HOSPITAL_BASED_OUTPATIENT_CLINIC_OR_DEPARTMENT_OTHER): Payer: PPO | Admitting: Hematology

## 2022-06-13 ENCOUNTER — Other Ambulatory Visit: Payer: Self-pay

## 2022-06-13 ENCOUNTER — Encounter: Payer: Self-pay | Admitting: Hematology

## 2022-06-13 ENCOUNTER — Inpatient Hospital Stay: Payer: PPO

## 2022-06-13 VITALS — BP 151/88 | HR 74 | Temp 98.5°F | Resp 18 | Ht 65.0 in | Wt 216.1 lb

## 2022-06-13 DIAGNOSIS — C8441 Peripheral T-cell lymphoma, not classified, lymph nodes of head, face, and neck: Secondary | ICD-10-CM

## 2022-06-13 DIAGNOSIS — C210 Malignant neoplasm of anus, unspecified: Secondary | ICD-10-CM | POA: Diagnosis not present

## 2022-06-13 DIAGNOSIS — D500 Iron deficiency anemia secondary to blood loss (chronic): Secondary | ICD-10-CM

## 2022-06-13 DIAGNOSIS — R221 Localized swelling, mass and lump, neck: Secondary | ICD-10-CM

## 2022-06-13 LAB — COMPREHENSIVE METABOLIC PANEL
ALT: 11 U/L (ref 0–44)
AST: 16 U/L (ref 15–41)
Albumin: 4 g/dL (ref 3.5–5.0)
Alkaline Phosphatase: 91 U/L (ref 38–126)
Anion gap: 7 (ref 5–15)
BUN: 13 mg/dL (ref 8–23)
CO2: 30 mmol/L (ref 22–32)
Calcium: 9.2 mg/dL (ref 8.9–10.3)
Chloride: 106 mmol/L (ref 98–111)
Creatinine, Ser: 1.02 mg/dL — ABNORMAL HIGH (ref 0.44–1.00)
GFR, Estimated: 59 mL/min — ABNORMAL LOW (ref >60)
Glucose, Bld: 97 mg/dL (ref 70–99)
Potassium: 3.9 mmol/L (ref 3.5–5.1)
Sodium: 143 mmol/L (ref 135–145)
Total Bilirubin: 0.4 mg/dL (ref 0.3–1.2)
Total Protein: 7.1 g/dL (ref 6.5–8.1)

## 2022-06-13 LAB — CBC WITH DIFFERENTIAL/PLATELET
Abs Immature Granulocytes: 0 10*3/uL (ref 0.00–0.07)
Basophils Absolute: 0 10*3/uL (ref 0.0–0.1)
Basophils Relative: 1 %
Eosinophils Absolute: 0.1 10*3/uL (ref 0.0–0.5)
Eosinophils Relative: 2 %
HCT: 40.3 % (ref 36.0–46.0)
Hemoglobin: 13.3 g/dL (ref 12.0–15.0)
Immature Granulocytes: 0 %
Lymphocytes Relative: 34 %
Lymphs Abs: 1.2 10*3/uL (ref 0.7–4.0)
MCH: 28 pg (ref 26.0–34.0)
MCHC: 33 g/dL (ref 30.0–36.0)
MCV: 84.8 fL (ref 80.0–100.0)
Monocytes Absolute: 0.4 10*3/uL (ref 0.1–1.0)
Monocytes Relative: 12 %
Neutro Abs: 1.9 10*3/uL (ref 1.7–7.7)
Neutrophils Relative %: 51 %
Platelets: 198 10*3/uL (ref 150–400)
RBC: 4.75 MIL/uL (ref 3.87–5.11)
RDW: 14.4 % (ref 11.5–15.5)
WBC: 3.6 10*3/uL — ABNORMAL LOW (ref 4.0–10.5)
nRBC: 0 % (ref 0.0–0.2)

## 2022-06-13 LAB — FERRITIN: Ferritin: 20 ng/mL (ref 11–307)

## 2022-06-13 LAB — IRON AND IRON BINDING CAPACITY (CC-WL,HP ONLY)
Iron: 65 ug/dL (ref 28–170)
Saturation Ratios: 21 % (ref 10.4–31.8)
TIBC: 318 ug/dL (ref 250–450)
UIBC: 253 ug/dL (ref 148–442)

## 2022-06-13 NOTE — Progress Notes (Signed)
[facility]   [phone] Fax:([phone]) [phone]   Clinic Follow up Note   Patient Care Team: Kathyrn Lass, MD as PCP - General (Family Medicine) Wonda Horner, MD as Consulting Physician (Gastroenterology) Truitt Merle, MD as Consulting Physician (Hematology) Alla Feeling, NP as Nurse Practitioner (Nurse Practitioner) Kyung Rudd, MD as Consulting Physician (Radiation Oncology) Michael Boston, MD as Consulting Physician (General Surgery)  Date of Service:  06/13/2022  CHIEF COMPLAINT: f/u of new lymphadenopathy  CURRENT THERAPY:  Pending  ASSESSMENT & PLAN:  Cynthia Velasquez is a 71 y.o. female with   1. T-cell lymphoproliferative disorder -presented to PCP on 05/23/22 with new right neck lump and associated neck stiffness. It has been getting worse, she has developed more peripheral adenopathy in the neck, supraclavicular and right axillary on exam today  -neck CT on 06/04/22 showed: multiple enlarged lymph nodes in right neck; no pharyngeal mass. -lymph node biopsy performed 06/10/22. 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 -I briefly discussed the different lymphoma types and general treatment, including chemotherapy and bone marrow transplant, and prognosis for each.  Due to the limited core needle biopsy sample, pathologist recommends excisional lymph node biopsy.  I presently reach out to ENT Dr. Constance Holster, he will see her next week.  -she is scheduled for CT CAP on 06/17/22. We will change this to a PET scan to have done as soon as possible. -echo and port placement  -in order for her to receive the best care, I will refer her to my partner, Dr. Irene Limbo, who treats lymphoma more frequently. I will ensure all her work up is done and prepared before she sees Dr. Irene Limbo next week.    2. Anal Squamous Cell Carcinoma, cT2N0M0 -diagnosed in  05/2019. Workup showed a 3 cm mass at the anorectal junction, biopsy confirmed well to moderately differentiated squamous cell carcinoma. 06/21/19 PET scan showed no metastasis.  -S/p concurrent chemoRT with Mitomycin and 5FU on 08/09/19. Now on surveillance. -surveillance CT CAP 07/09/21 was NED. -surveillance colonoscopy done for rectal bleeding on 12/07/21 showed only radiation changes. Her rectal bleeding has resolved. -she is now three years out from diagnosis.    3. 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 She was adopted, no known family history       PLAN: -will cancel her upcoming CT and change to PET scan, which is scheduled for 10/4. -urgent referral to ENT Dr. Constance Holster for cervical node biopsy  -echo and port placement next week -will schedule her to see Dr. Irene Limbo next week after PET  -f/u with me open    No problem-specific Assessment & Plan notes found for this encounter.   SUMMARY OF ONCOLOGIC HISTORY: Oncology History Overview Note  Cancer Staging Anal cancer [facility]) Staging form: Anus, AJCC 8th Edition - Clinical stage from 06/28/2019: Stage IIA (cT2, cN0, cM0) - Signed by Alla Feeling, NP on 06/28/2019  Breast cancer of upper-outer quadrant of left female breast [facility]) Staging form: Breast, AJCC 7th Edition - Clinical stage from 09/07/2013: Stage IIA (T2, N0, cM0) -  Signed by [name], MD on 09/07/2013 - Pathologic: Stage IIA (T2, N0, cM0) - Signed by [name], MD on 09/07/2013  Breast cancer of upper-outer quadrant of right female breast [facility]) Staging form: Breast, AJCC  7th Edition - Clinical stage from 09/07/2013: Stage IA (T1a, N0, cM0) - Signed by [name], MD on 09/07/2013 - Pathologic: Stage IA (T1a, N0, cM0) - Signed by [name], MD on 09/07/2013    Breast cancer of upper-outer quadrant of left female breast ([place])  07/10/2007 Procedure   US biopsy showed invasive ductal carcinoma 0.8cm, Nottingham grade 3   07/30/2007 Surgery   She had left breast lumpectomy and LN biopsy which showed high grade invasive ductal cancer Nottingham grade 3, 2.7 cm, ER/PR/Her2 neu negative   11/24/2007 Surgery   Patient elected for bilateral mastectomy   09/07/2008 - 03/08/2009 Chemotherapy   dates are approximate: she received adriamycin and cytoxan followed by Taxol   03/08/2009 - 05/08/2009 Radiation Therapy   dates are approximate, she received XRT    07/09/2021 Imaging   CT CAP  IMPRESSION: 1. No evidence to suggest locally recurrent ianorectal neoplasm or metastatic disease in the chest, abdomen or pelvis. 2. Status post bilateral modified radical mastectomy and bilateral axillary lymph node dissection. 3. Three small supraumbilical ventral hernias containing only omental fat. No associated bowel incarceration or obstruction at this time. 4. Aortic atherosclerosis. 5. Additional incidental findings, as above.   Breast cancer of upper-outer quadrant of right female breast ([place])  07/10/2006 Procedure   stereotactic biopsy showed atypical hyperplasia   10/23/2006 Surgery   right lumpectomy showed invasive ductal ca (Nottingham grade 1)  7mm and DCIS, ER/PR positive Her 2 negative   10/09/2007 - 10/08/2012 Chemotherapy   Patient was placed on Tamoxifen   03/24/2017 Imaging   No acute findings. No evidence of recurrent carcinoma or metastatic disease   07/09/2021 Imaging   CT CAP  IMPRESSION: 1. No evidence to suggest locally recurrent ianorectal neoplasm or metastatic disease in the chest, abdomen or pelvis. 2. Status post bilateral modified  radical mastectomy and bilateral axillary lymph node dissection. 3. Three small supraumbilical ventral hernias containing only omental fat. No associated bowel incarceration or obstruction at this time. 4. Aortic atherosclerosis. 5. Additional incidental findings, as above.   Anal cancer ([place])  06/04/2019 Procedure   Colonoscopy per Dr. Anson Fret: Findings-the digital rectal exam revealed a 3 cm diameter firm rectal mass.  The mass was noncircumferential and located predominantly at the right bowel wall at the anorectal junction. A nonobstructing mass was found at the anus and in the rectum    06/04/2019 Initial Biopsy   Follow pathology: Large intestine, rectum biopsy: Invasive well to moderately differentiated squamous cell carcinoma.  No rectal mucosa present.  There is strong diffuse expression of P 16 immunostain.  CDX 2, p63 and mCEA immunostains are also used in the diagnostic work-up of the case.   06/04/2019 Initial Diagnosis   Anal cancer ([place])   06/21/2019 PET scan   IMPRESSION: 1. Anorectal primary. No hypermetabolic metastatic disease within the chest, abdomen, or pelvis. Perirectal nodes, at least 1 of which is new since 02/26/2018 CT, suspicious based on size and interval development. 2. Mild limitations secondary to beam hardening artifact from bilateral hip arthroplasty. 3.  Aortic Atherosclerosis (ICD10-I70.0).   06/28/2019 Cancer Staging   Staging form: Anus, AJCC 8th Edition - Clinical stage from 06/28/2019: Stage IIA (cT2, cN0, cM0) - Signed by Alla Feeling, NP on 06/28/2019  06/28/2019 - 07/26/2019 Chemotherapy   concurrent chemoRT with Mitomycin and 5FU on week 1 and week 5 on starting 06/28/19. Last dose on 07/26/19   06/28/2019 - 08/09/2019 Radiation Therapy   concurrent chemoRT with Dr [name] 06/28/19-08/09/19   11/01/2019 PET scan   IMPRESSION: 1. Marked interval decrease in hypermetabolism noted at the level of the anal rectal primary. No evidence  for hypermetabolic metastatic disease in the chest, abdomen, or pelvis. The perirectal lymph nodes identified previously have resolved in the interval. 2.  Aortic Atherosclerois (ICD10-170.0)   08/07/2020 Imaging   CT CAP  IMPRESSION: 1. No findings for residual/recurrent anal tumor, regional lymphadenopathy or metastatic disease. 2. Status post cholecystectomy. No biliary dilatation. 3. Small anterior abdominal wall hernia containing fat. 4. Aortic atherosclerosis.   Aortic Atherosclerosis (ICD10-I70.0).     07/09/2021 Imaging   CT CAP  IMPRESSION: 1. No evidence to suggest locally recurrent ianorectal neoplasm or metastatic disease in the chest, abdomen or pelvis. 2. Status post bilateral modified radical mastectomy and bilateral axillary lymph node dissection. 3. Three small supraumbilical ventral hernias containing only omental fat. No associated bowel incarceration or obstruction at this time. 4. Aortic atherosclerosis. 5. Additional incidental findings, as above.      INTERVAL HISTORY:  Cynthia Velasquez is here for a follow up of new lymphadenopathy. She was last seen by me on 05/31/22. She presents to the clinic alone, with her daughter (who brought her baby and thus could not come in the room) on speaker phone. She reports she cannot lay on her right side because of her neck. She also reports she feels thirsty, like she cannot drink enough water. She also reports bloating and some loose stool.   All other systems were reviewed with the patient and are negative.  MEDICAL HISTORY:  Past Medical History:  Diagnosis Date   Anal cancer ([place]) dx'd 05/2019   Anxiety    Arthritis    Bilateral breast cancer ([place]) 10/21/2013   Breast cancer ([place])    Cancer ([place])    Depression    Gallstones    GERD (gastroesophageal reflux disease)    doesn't take any meds for this   Headache    h/o migraines       History of bladder infections    History of hiatal hernia    History of  migraine    Hyperlipidemia    takes Simvastatin daily   Hypertension    takes Hyzaar   Joint pain    Joint swelling    Lumbar stenosis    Neuropathy 09/07/2013   Pneumonia    Pre-diabetes     SURGICAL HISTORY: Past Surgical History:  Procedure Laterality Date   ABDOMINAL HYSTERECTOMY     bone spur removed from left foot     CHOLECYSTECTOMY     COLONOSCOPY     COLONOSCOPY WITH PROPOFOL Bilateral 12/07/2021   Procedure: COLONOSCOPY WITH PROPOFOL;  Surgeon: Clarene Essex, MD;  Location: [facility];  Service: Endoscopy;  Laterality: Bilateral;   double mastectomy      ESOPHAGOGASTRODUODENOSCOPY N/A 12/07/2021   Procedure: ESOPHAGOGASTRODUODENOSCOPY (EGD);  Surgeon: Clarene Essex, MD;  Location: [facility];  Service: Endoscopy;  Laterality: N/A;   HERNIA REPAIR     umbilical   right knee arthroscopy     right shoulder arthroscopy     surgery for hiatal hernia     TONSILLECTOMY     TOTAL HIP ARTHROPLASTY Left 02/12/2013   TOTAL HIP ARTHROPLASTY Left 02/12/2013   Procedure: LEFT  TOTAL HIP ARTHROPLASTY;  Surgeon: Kerin Salen, MD;  Location: [facility];  Service: Orthopedics;  Laterality: Left;   TOTAL HIP ARTHROPLASTY Right 05/03/2013   Procedure: TOTAL HIP ARTHROPLASTY;  Surgeon: Kerin Salen, MD;  Location: [facility];  Service: Orthopedics;  Laterality: Right;   TOTAL KNEE ARTHROPLASTY Right 02/27/2015   Procedure: TOTAL KNEE ARTHROPLASTY;  Surgeon: Frederik Pear, MD;  Location: [facility];  Service: Orthopedics;  Laterality: Right;   TOTAL SHOULDER ARTHROPLASTY Right 09/12/2016   Procedure: RIGHT TOTAL SHOULDER ARTHROPLASTY;  Surgeon: Tania Ade, MD;  Location: [facility];  Service: Orthopedics;  Laterality: Right;  RIGHT TOTAL SHOULDER ARTHROPLASTY    I have reviewed the social history and family history with the patient and they are unchanged from previous note.  ALLERGIES:  is allergic to oxycodone-acetaminophen, tramadol, codeine, and vicodin [hydrocodone-acetaminophen].  MEDICATIONS:   Current Outpatient Medications  Medication Sig Dispense Refill   aspirin-acetaminophen-caffeine (EXCEDRIN MIGRAINE) 250-250-65 MG tablet Take 2 tablets by mouth every 6 (six) hours as needed for headache.     LINZESS 72 MCG capsule Take 72 mcg by mouth daily as needed (constipation).     losartan (COZAAR) 100 MG tablet Take 100 mg by mouth daily.  6   meclizine (ANTIVERT) 12.5 MG tablet Take 1 tablet (12.5 mg total) by mouth 3 (three) times daily as needed for dizziness. 30 tablet 0   Menthol-Methyl Salicylate (SALONPAS PAIN RELIEF PATCH EX) Apply 1 patch topically daily as needed (pain).     rosuvastatin (CRESTOR) 40 MG tablet Take 40 mg by mouth daily.     No current facility-administered medications for this visit.    PHYSICAL EXAMINATION: ECOG PERFORMANCE STATUS: 1 - Symptomatic but completely ambulatory  Vitals:   06/13/22 1336  BP: (!) 151/88  Pulse: 74  Resp: 18  Temp: 98.5 F (36.9 C)  SpO2: 97%   Wt Readings from Last 3 Encounters:  06/13/22 216 lb 1.6 oz (98 kg)  06/10/22 217 lb (98.4 kg)  05/31/22 219 lb 8 oz (99.6 kg)     GENERAL:alert, no distress and comfortable SKIN: skin color, texture, turgor are normal, no rashes or significant lesions EYES: normal, Conjunctiva are pink and non-injected, sclera clear  NECK: supple, thyroid normal size, non-tender LYMPH:  (+) 3.5 x 5.5 cm nodule in right neck, (+) 1 x 1.5 cm nodule in left mid neck, (+) 1 cm node in right supraclavicular, and a few small nodes in right axilla, no palpable adenopathy in left axilla or groin LUNGS: clear to auscultation and percussion with normal breathing effort HEART: regular rate & rhythm and no murmurs and no lower extremity edema Musculoskeletal:no cyanosis of digits and no clubbing  NEURO: alert & oriented x 3 with fluent speech, no focal motor/sensory deficits  LABORATORY DATA:  I have reviewed the data as listed    Latest Ref Rng & Units 06/13/2022    1:18 PM 06/10/2022   11:30 AM  04/07/2022    9:28 AM  CBC  WBC 4.0 - 10.5 K/uL 3.6  3.7  5.1   Hemoglobin 12.0 - 15.0 g/dL 13.3  13.4  13.3   Hematocrit 36.0 - 46.0 % 40.3  42.5  42.2   Platelets 150 - 400 K/uL 198  203  261         Latest Ref Rng & Units 06/13/2022    1:18 PM 06/10/2022   11:30 AM 06/04/2022    5:32 PM  CMP  Glucose 70 - 99 mg/dL 97  93    BUN 8 - 23 mg/dL 13  14    Creatinine 0.44 - 1.00 mg/dL 1.02  0.76  1.10   Sodium 135 - 145 mmol/L 143  141    Potassium 3.5 - 5.1 mmol/L 3.9  3.2    Chloride 98 - 111 mmol/L 106  109    CO2 22 - 32 mmol/L 30  24    Calcium 8.9 - 10.3 mg/dL 9.2  9.5    Total Protein 6.5 - 8.1 g/dL 7.1     Total Bilirubin 0.3 - 1.2 mg/dL 0.4     Alkaline Phos 38 - 126 U/L 91     AST 15 - 41 U/L 16     ALT 0 - 44 U/L 11         RADIOGRAPHIC STUDIES: I have personally reviewed the radiological images as listed and agreed with the findings in the report. No results found.    Orders Placed This Encounter  Procedures   NM PET Image Restag (PS) Skull Base To Thigh    Standing Status:   Future    Standing Expiration Date:   06/13/2023    Order Specific Question:   If indicated for the ordered procedure, I authorize the administration of a radiopharmaceutical per Radiology protocol    Answer:   Yes    Order Specific Question:   Preferred imaging location?    Answer:   [place]   IR IMAGING GUIDED PORT INSERTION    Standing Status:   Future    Standing Expiration Date:   06/14/2023    Order Specific Question:   Reason for Exam (SYMPTOM  OR DIAGNOSIS REQUIRED)    Answer:   chemo    Order Specific Question:   Preferred Imaging Location?    Answer:   [facility]   Lactate dehydrogenase (LDH)    Standing Status:   Future    Standing Expiration Date:   06/13/2023   ECHOCARDIOGRAM COMPLETE    Standing Status:   Future    Standing Expiration Date:   06/14/2023    Order Specific Question:   Where should this test be performed    Answer:   [place]    Order  Specific Question:   Perflutren DEFINITY (image enhancing agent) should be administered unless hypersensitivity or allergy exist    Answer:   Administer Perflutren    Order Specific Question:   Is a special reader required? (athlete or structural heart)    Answer:   No    Order Specific Question:   Does this study need to be read by the Structural team/Level 3 readers?    Answer:   No    Order Specific Question:   Reason for exam-Echo    Answer:   Chemo  [id number]   All questions were answered. The patient knows to call the clinic with any problems, questions or concerns. No barriers to learning was detected. The total time spent in the appointment was 40 minutes.     Truitt Merle, MD 06/13/2022   I, Wilburn Mylar, am acting as scribe for Truitt Merle, MD.   I have reviewed the above documentation for accuracy and completeness, and I agree with the above.

## 2022-06-14 ENCOUNTER — Other Ambulatory Visit: Payer: Self-pay

## 2022-06-14 ENCOUNTER — Telehealth: Payer: Self-pay | Admitting: Hematology

## 2022-06-14 DIAGNOSIS — R221 Localized swelling, mass and lump, neck: Secondary | ICD-10-CM

## 2022-06-14 DIAGNOSIS — M5416 Radiculopathy, lumbar region: Secondary | ICD-10-CM | POA: Diagnosis not present

## 2022-06-14 DIAGNOSIS — M47896 Other spondylosis, lumbar region: Secondary | ICD-10-CM | POA: Diagnosis not present

## 2022-06-14 DIAGNOSIS — C8441 Peripheral T-cell lymphoma, not classified, lymph nodes of head, face, and neck: Secondary | ICD-10-CM

## 2022-06-14 NOTE — Telephone Encounter (Signed)
Contacted patient to scheduled appointments. Patient is aware of appointments that are scheduled.   

## 2022-06-14 NOTE — Progress Notes (Signed)
Epic faxed the referral order, Dr. Ernestina Penna last office note, CT of throat, [name] of throat, and pt demographics.  Epic fax confirmation received.

## 2022-06-15 ENCOUNTER — Other Ambulatory Visit: Payer: Self-pay

## 2022-06-16 ENCOUNTER — Other Ambulatory Visit: Payer: Self-pay

## 2022-06-17 ENCOUNTER — Other Ambulatory Visit: Payer: PPO

## 2022-06-17 ENCOUNTER — Ambulatory Visit (HOSPITAL_COMMUNITY)
Admission: RE | Admit: 2022-06-17 | Discharge: 2022-06-17 | Disposition: A | Discharge status: Home / Self Care | Payer: PPO | Source: Ambulatory Visit | Attending: Hematology | Admitting: Hematology

## 2022-06-17 ENCOUNTER — Ambulatory Visit (HOSPITAL_COMMUNITY): Payer: PPO

## 2022-06-17 ENCOUNTER — Encounter (HOSPITAL_COMMUNITY): Payer: Self-pay

## 2022-06-17 DIAGNOSIS — Z0189 Encounter for other specified special examinations: Secondary | ICD-10-CM

## 2022-06-17 DIAGNOSIS — Z853 Personal history of malignant neoplasm of breast: Secondary | ICD-10-CM | POA: Insufficient documentation

## 2022-06-17 DIAGNOSIS — I10 Essential (primary) hypertension: Secondary | ICD-10-CM | POA: Diagnosis not present

## 2022-06-17 DIAGNOSIS — C8441 Peripheral T-cell lymphoma, not classified, lymph nodes of head, face, and neck: Secondary | ICD-10-CM | POA: Diagnosis not present

## 2022-06-17 DIAGNOSIS — E785 Hyperlipidemia, unspecified: Secondary | ICD-10-CM | POA: Diagnosis not present

## 2022-06-17 LAB — ECHOCARDIOGRAM COMPLETE
Area-P 1/2: 2.21 cm2
S' Lateral: 2.1 cm

## 2022-06-17 NOTE — Progress Notes (Signed)
  Echocardiogram 2D Echocardiogram has been performed.  [name] M 06/17/2022, 9:47 AM

## 2022-06-19 ENCOUNTER — Encounter (HOSPITAL_COMMUNITY)
Admission: RE | Admit: 2022-06-19 | Discharge: 2022-06-19 | Disposition: A | Discharge status: Home / Self Care | Payer: PPO | Source: Ambulatory Visit | Attending: Hematology | Admitting: Hematology

## 2022-06-19 ENCOUNTER — Encounter: Payer: Self-pay | Admitting: Hematology

## 2022-06-19 DIAGNOSIS — C8441 Peripheral T-cell lymphoma, not classified, lymph nodes of head, face, and neck: Secondary | ICD-10-CM | POA: Diagnosis not present

## 2022-06-19 DIAGNOSIS — Z853 Personal history of malignant neoplasm of breast: Secondary | ICD-10-CM | POA: Diagnosis not present

## 2022-06-19 DIAGNOSIS — R9389 Abnormal findings on diagnostic imaging of other specified body structures: Secondary | ICD-10-CM | POA: Diagnosis not present

## 2022-06-19 DIAGNOSIS — R599 Enlarged lymph nodes, unspecified: Secondary | ICD-10-CM | POA: Diagnosis not present

## 2022-06-19 LAB — SURGICAL PATHOLOGY

## 2022-06-19 LAB — GLUCOSE, CAPILLARY: Glucose-Capillary: 97 mg/dL (ref 70–99)

## 2022-06-19 MED ORDER — FLUDEOXYGLUCOSE F - 18 (FDG) INJECTION
10.8000 | Freq: Once | INTRAVENOUS | Status: AC | PRN
  Administered 2022-06-19: 10.8 via INTRAVENOUS

## 2022-06-20 DIAGNOSIS — M47896 Other spondylosis, lumbar region: Secondary | ICD-10-CM | POA: Diagnosis not present

## 2022-06-20 DIAGNOSIS — M5416 Radiculopathy, lumbar region: Secondary | ICD-10-CM | POA: Diagnosis not present

## 2022-06-20 LAB — SURGICAL PATHOLOGY

## 2022-06-21 ENCOUNTER — Other Ambulatory Visit (HOSPITAL_COMMUNITY)
Admission: RE | Admit: 2022-06-21 | Discharge: 2022-06-21 | Disposition: A | Discharge status: Home / Self Care | Payer: PPO | Source: Ambulatory Visit | Attending: Otolaryngology | Admitting: Otolaryngology

## 2022-06-21 ENCOUNTER — Inpatient Hospital Stay: Payer: PPO | Attending: Hematology | Admitting: Hematology

## 2022-06-21 ENCOUNTER — Encounter: Payer: Self-pay | Admitting: Hematology

## 2022-06-21 ENCOUNTER — Other Ambulatory Visit: Payer: Self-pay | Admitting: Otolaryngology

## 2022-06-21 VITALS — BP 158/100 | HR 78 | Temp 98.3°F | Resp 16 | Wt 218.9 lb

## 2022-06-21 DIAGNOSIS — Z9013 Acquired absence of bilateral breasts and nipples: Secondary | ICD-10-CM | POA: Insufficient documentation

## 2022-06-21 DIAGNOSIS — C8598 Non-Hodgkin lymphoma, unspecified, lymph nodes of multiple sites: Secondary | ICD-10-CM | POA: Insufficient documentation

## 2022-06-21 DIAGNOSIS — R591 Generalized enlarged lymph nodes: Secondary | ICD-10-CM | POA: Diagnosis not present

## 2022-06-21 DIAGNOSIS — Z5111 Encounter for antineoplastic chemotherapy: Secondary | ICD-10-CM | POA: Diagnosis not present

## 2022-06-21 DIAGNOSIS — Z9221 Personal history of antineoplastic chemotherapy: Secondary | ICD-10-CM

## 2022-06-21 DIAGNOSIS — Z79899 Other long term (current) drug therapy: Secondary | ICD-10-CM | POA: Insufficient documentation

## 2022-06-21 DIAGNOSIS — Z923 Personal history of irradiation: Secondary | ICD-10-CM

## 2022-06-21 DIAGNOSIS — C8491 Mature T/NK-cell lymphomas, unspecified, lymph nodes of head, face, and neck: Secondary | ICD-10-CM | POA: Diagnosis not present

## 2022-06-21 DIAGNOSIS — K769 Liver disease, unspecified: Secondary | ICD-10-CM | POA: Insufficient documentation

## 2022-06-21 DIAGNOSIS — C210 Malignant neoplasm of anus, unspecified: Secondary | ICD-10-CM | POA: Diagnosis not present

## 2022-06-21 DIAGNOSIS — R590 Localized enlarged lymph nodes: Secondary | ICD-10-CM | POA: Diagnosis not present

## 2022-06-21 DIAGNOSIS — Z853 Personal history of malignant neoplasm of breast: Secondary | ICD-10-CM | POA: Diagnosis not present

## 2022-06-21 DIAGNOSIS — Z5189 Encounter for other specified aftercare: Secondary | ICD-10-CM | POA: Insufficient documentation

## 2022-06-21 DIAGNOSIS — C8448 Peripheral T-cell lymphoma, not classified, lymph nodes of multiple sites: Secondary | ICD-10-CM

## 2022-06-24 ENCOUNTER — Encounter: Payer: Self-pay | Admitting: Hematology

## 2022-06-24 ENCOUNTER — Other Ambulatory Visit: Payer: Self-pay | Admitting: Radiology

## 2022-06-24 DIAGNOSIS — M47896 Other spondylosis, lumbar region: Secondary | ICD-10-CM | POA: Diagnosis not present

## 2022-06-24 DIAGNOSIS — M5416 Radiculopathy, lumbar region: Secondary | ICD-10-CM | POA: Diagnosis not present

## 2022-06-24 NOTE — H&P (Signed)
Chief Complaint: Patient was seen in consultation today for T-cell lymphoproliferative disorder at the request of Cynthia Velasquez  Referring Physician(s): Cynthia Velasquez  Supervising Physician: Aletta Edouard  Patient Status: Cynthia Velasquez - Out-pt  History of Present Illness: Cynthia Velasquez is a 71 y.o. female known to IR service after having lymph node biopsy for right neck mass 06/10/2022 that revealed T-cell lymphoproliferative disorder.  She has previous medical history of anal squamous cell carcinoma in 2020 and bilateral breast cancer status post mastectomies in 2007.  Patient has been referred to IR by Truitt Merle, MD for tunneled catheter with port placement for chemotherapy.  Past Medical History:  Diagnosis Date   Anal cancer ([place]) dx'd 05/2019   Anxiety    Arthritis    Bilateral breast cancer ([place]) 10/21/2013   Breast cancer ([place])    Cancer ([place])    Depression    Gallstones    GERD (gastroesophageal reflux disease)    doesn't take any meds for this   Headache    h/o migraines       History of bladder infections    History of hiatal hernia    History of migraine    Hyperlipidemia    takes Simvastatin daily   Hypertension    takes Hyzaar   Joint pain    Joint swelling    Lumbar stenosis    Neuropathy 09/07/2013   Pneumonia    Pre-diabetes     Past Surgical History:  Procedure Laterality Date   ABDOMINAL HYSTERECTOMY     bone spur removed from left foot     CHOLECYSTECTOMY     COLONOSCOPY     COLONOSCOPY WITH PROPOFOL Bilateral 12/07/2021   Procedure: COLONOSCOPY WITH PROPOFOL;  Surgeon: Clarene Essex, MD;  Location: [facility];  Service: Endoscopy;  Laterality: Bilateral;   double mastectomy      ESOPHAGOGASTRODUODENOSCOPY N/A 12/07/2021   Procedure: ESOPHAGOGASTRODUODENOSCOPY (EGD);  Surgeon: Clarene Essex, MD;  Location: [facility];  Service: Endoscopy;  Laterality: N/A;   HERNIA REPAIR     umbilical   right knee arthroscopy     right shoulder arthroscopy     surgery for  hiatal hernia     TONSILLECTOMY     TOTAL HIP ARTHROPLASTY Left 02/12/2013   TOTAL HIP ARTHROPLASTY Left 02/12/2013   Procedure: LEFT TOTAL HIP ARTHROPLASTY;  Surgeon: Kerin Salen, MD;  Location: [facility];  Service: Orthopedics;  Laterality: Left;   TOTAL HIP ARTHROPLASTY Right 05/03/2013   Procedure: TOTAL HIP ARTHROPLASTY;  Surgeon: Kerin Salen, MD;  Location: [facility];  Service: Orthopedics;  Laterality: Right;   TOTAL KNEE ARTHROPLASTY Right 02/27/2015   Procedure: TOTAL KNEE ARTHROPLASTY;  Surgeon: Frederik Pear, MD;  Location: [facility];  Service: Orthopedics;  Laterality: Right;   TOTAL SHOULDER ARTHROPLASTY Right 09/12/2016   Procedure: RIGHT TOTAL SHOULDER ARTHROPLASTY;  Surgeon: Tania Ade, MD;  Location: [facility];  Service: Orthopedics;  Laterality: Right;  RIGHT TOTAL SHOULDER ARTHROPLASTY    Allergies: Oxycodone-acetaminophen, Tramadol, Codeine, and Vicodin [hydrocodone-acetaminophen]  Medications: Prior to Admission medications   Medication Sig Start Date End Date Taking? Authorizing Provider  aspirin-acetaminophen-caffeine (EXCEDRIN MIGRAINE) [phone] MG tablet Take 2 tablets by mouth every 6 (six) hours as needed for headache.    [provider]  LINZESS 72 MCG capsule Take 72 mcg by mouth daily as needed (constipation). 06/04/19   [provider]  losartan (COZAAR) 100 MG tablet Take 100 mg by mouth daily. 01/14/17   [provider]  meclizine (ANTIVERT) 12.5  MG tablet Take 1 tablet (12.5 mg total) by mouth 3 (three) times daily as needed for dizziness. 10/27/21   Hayden Rasmussen, MD  Menthol-Methyl Salicylate [facility] PAIN RELIEF PATCH EX) Apply 1 patch topically daily as needed (pain).    [provider]  rosuvastatin (CRESTOR) 40 MG tablet Take 40 mg by mouth daily. 07/30/19   [provider]     Family History  Adopted: Yes  Problem Relation Age of Onset   Allergic rhinitis Neg Hx    Angioedema Neg Hx    Atopy Neg Hx    Eczema  Neg Hx    Immunodeficiency Neg Hx    Urticaria Neg Hx    Asthma Neg Hx     Social History   Socioeconomic History   Marital status: Single    Spouse name: Not on file   Number of children: 1   Years of education: Not on file   Highest education level: Not on file  Occupational History   Not on file  Tobacco Use   Smoking status: Never   Smokeless tobacco: Never  Vaping Use   Vaping Use: Never used  Substance and Sexual Activity   Alcohol use: No   Drug use: No   Sexual activity: Not Currently  Other Topics Concern   Not on file  Social History Narrative   Not on file   Social Determinants of Health   Financial Resource Strain: Not on file  Food Insecurity: Not on file  Transportation Needs: No Transportation Needs (06/17/2019)   PRAPARE - Transportation    Lack of Transportation (Medical): No    Lack of Transportation (Non-Medical): No  Physical Activity: Not on file  Stress: Not on file  Social Connections: Not on file    Review of Systems: A 12 point ROS discussed and pertinent positives are indicated in the HPI above.  All other systems are negative.  Review of Systems  Vital Signs: There were no vitals taken for this visit.    Physical Exam  Imaging: NM PET Image Restag (PS) Skull Base To Thigh  Result Date: 06/19/2022 CLINICAL DATA:  Initial treatment strategy for T-cell lymphoma. Also history of anal cancer and remote history of breast cancer EXAM: NUCLEAR MEDICINE PET SKULL BASE TO THIGH TECHNIQUE: 10.7 mCi F-18 FDG was injected intravenously. Full-ring PET imaging was performed from the skull base to thigh after the radiotracer. CT data was obtained and used for attenuation correction and anatomic localization. Fasting blood glucose: 97 mg/dl COMPARISON:  Multiple exams, including CT neck 06/04/2022; CT scan 07/09/2021; PET-CT 11/01/2019 FINDINGS: Mediastinal blood pool activity: SUV max 2.6 Liver activity: SUV max 4.0 NECK: Hypermetabolic right  infra-auricular lymph node along with hypermetabolic right mandibular node, right submandibular lymph node, right level IIa adenopathy, bilateral level III and IV adenopathy as well as a right V lymph node. Index right infra-auricular node measures 1.8 cm in short axis with maximum SUV 14.4, Deauville 5. This previously measured 1.8 cm in short axis on 06/04/2022 and was not present on the prior PET-CT. Index left level III lymph node measures 0.9 cm in short axis on image 42 series 4 with maximum SUV 8.4, Deauville 5. Index right level IV lymph node on image 45 series 4 measures 1.3 cm in short axis with maximum SUV 11.2, Deauville 5. Incidental CT findings: Small mucous retention cyst of the left maxillary sinus. CHEST: No significant abnormal hypermetabolic activity in this region. Incidental CT findings: Bilateral mastectomies. Thoracic aortic atherosclerosis.  ABDOMEN/PELVIS: Small focus of hypermetabolic activity in segment 4 of the liver just above the level of the gallbladder fossa, maximum SUV 5.4 which is mildly above background liver activity (Deauville 4). This focus measures only about 1 cm in diameter. An early metastatic focus is not excluded. Incidental CT findings: Cholecystectomy. Pelvis partially obscured on the CT dated due to streak artifact from bilateral hip implants. Small supraumbilical hernias contain adipose tissue. SKELETON: Accentuated activity around the right shoulder prosthesis, considered physiologic. There is accentuated activity at the severe degenerative findings of the left glenohumeral joint, considered benign. Incidental CT findings: Bilateral hip prostheses. Dextroconvex lumbar scoliosis with rotary component. IMPRESSION: 1. Hypermetabolic bilateral neck adenopathy, Deauville 5, compatible with active malignancy. 2. New approximately 1 cm focus of mildly accentuated metabolic activity in segment 4 of the liver, Deauville 4. Early metastatic focus is a distinct possibility.  Hepatic protocol MRI with and without contrast could be utilized to confirm the presence of a corresponding lesion, which would seemingly be more likely to be related to the patient's anal carcinoma rather than lymphoma based on location. 3. Other imaging findings of potential clinical significance: Mild chronic left maxillary sinusitis. Aortic Atherosclerosis (ICD10-I70.0). Lumbar scoliosis. Small supraumbilical hernias contain adipose tissue. Electronically Signed   By: Van Clines M.D.   On: 06/19/2022 11:40   ECHOCARDIOGRAM COMPLETE  Result Date: 06/17/2022    ECHOCARDIOGRAM REPORT   Patient Name:   Cynthia Velasquez Date of Exam: 06/17/2022 Medical Rec #:  [id number]     Height:       65.0 in Accession #:    [id number]    Weight:       216.1 lb Date of Birth:  07/03/1951    BSA:          2.044 m Patient Age:    60 years      BP:           151/88 mmHg Patient Gender: F             HR:           65 bpm. Exam Location:  Outpatient Procedure: 2D Echo, 3D Echo, Color Doppler, Cardiac Doppler and Strain Analysis Indications:    Chemo Z09  History:        Patient has prior history of Echocardiogram examinations, most                 recent 11/24/2019. Risk Factors:Hypertension and Dyslipidemia.                 Past history of breast cancer.  Sonographer:    [name] RDCS Referring Phys: [id number] [facility]  Sonographer Comments: Suboptimal parasternal window. IMPRESSIONS  1. Left ventricular ejection fraction, by estimation, is 70 to 75%. The left ventricle has hyperdynamic function. The left ventricle has no regional wall motion abnormalities. Left ventricular diastolic parameters are consistent with Grade I diastolic dysfunction (impaired relaxation). The average left ventricular global longitudinal strain is -21.7 %. The global longitudinal strain is normal.  2. Right ventricular systolic function is normal. The right ventricular size is normal. Tricuspid regurgitation signal is inadequate for assessing PA  pressure.  3. The mitral valve is grossly normal. No evidence of mitral valve regurgitation.  4. The aortic valve is tricuspid. Aortic valve regurgitation is not visualized.  5. The inferior vena cava is normal in size with greater than 50% respiratory variability, suggesting right atrial pressure of 3 mmHg. Comparison(s): Changes from prior study are noted. 11/24/2019:  LVEF 60-65%, GLS 23.2%. FINDINGS  Left Ventricle: Left ventricular ejection fraction, by estimation, is 70 to 75%. The left ventricle has hyperdynamic function. The left ventricle has no regional wall motion abnormalities. The average left ventricular global longitudinal strain is -21.7  %. The global longitudinal strain is normal. The left ventricular internal cavity size was normal in size. There is no left ventricular hypertrophy. Left ventricular diastolic parameters are consistent with Grade I diastolic dysfunction (impaired relaxation). Indeterminate filling pressures. Right Ventricle: The right ventricular size is normal. No increase in right ventricular wall thickness. Right ventricular systolic function is normal. Tricuspid regurgitation signal is inadequate for assessing PA pressure. Left Atrium: Left atrial size was normal in size. Right Atrium: Right atrial size was normal in size. Pericardium: There is no evidence of pericardial effusion. Mitral Valve: The mitral valve is grossly normal. No evidence of mitral valve regurgitation. Tricuspid Valve: The tricuspid valve is grossly normal. Tricuspid valve regurgitation is not demonstrated. Aortic Valve: The aortic valve is tricuspid. Aortic valve regurgitation is not visualized. Pulmonic Valve: The pulmonic valve was normal in structure. Pulmonic valve regurgitation is not visualized. Aorta: The aortic root and ascending aorta are structurally normal, with no evidence of dilitation. Venous: The inferior vena cava is normal in size with greater than 50% respiratory variability, suggesting right  atrial pressure of 3 mmHg. IAS/Shunts: No atrial level shunt detected by color flow Doppler.  LEFT VENTRICLE PLAX 2D LVIDd:         3.50 cm   Diastology LVIDs:         2.10 cm   LV e' medial:    4.82 cm/s LV PW:         0.90 cm   LV E/e' medial:  10.4 LV IVS:        1.10 cm   LV e' lateral:   5.34 cm/s LVOT diam:     1.90 cm   LV E/e' lateral: 9.4 LV SV:         58 LV SV Index:   28        2D Longitudinal Strain LVOT Area:     2.84 cm  2D Strain GLS (A2C):   -21.4 %                          2D Strain GLS (A3C):   -20.2 %                          2D Strain GLS (A4C):   -23.6 %                          2D Strain GLS Avg:     -21.7 % RIGHT VENTRICLE RV S prime:     12.70 cm/s TAPSE (M-mode): 1.5 cm LEFT ATRIUM             Index        RIGHT ATRIUM          Index LA diam:        2.80 cm 1.37 cm/m   RA Area:     9.69 cm LA Vol (A2C):   32.5 ml 15.90 ml/m  RA Volume:   16.70 ml 8.17 ml/m LA Vol (A4C):   31.9 ml 15.60 ml/m LA Biplane Vol: 32.3 ml 15.80 ml/m  AORTIC VALVE LVOT Vmax:   104.00 cm/s LVOT Vmean:  69.800 cm/s  LVOT VTI:    0.205 m  AORTA Ao Root diam: 3.30 cm Ao Asc diam:  3.40 cm MITRAL VALVE MV Area (PHT): 2.21 cm    SHUNTS MV Decel Time: 344 msec    Systemic VTI:  0.20 m MV E velocity: 50.10 cm/s  Systemic Diam: 1.90 cm MV A velocity: 82.70 cm/s MV E/A ratio:  0.61 Lyman Bishop MD Electronically signed by Lyman Bishop MD Signature Date/Time: 06/17/2022/3:45:06 PM    Final    [name] CORE BIOPSY (LYMPH NODES)  Result Date: 06/10/2022 INDICATION: Nonpainful right neck mass.  No history of primary malignancy EXAM: ULTRASOUND GUIDED CORE BIOPSY OF RIGHT NECK MASS MEDICATIONS: Lidocaine 1% subcutaneous ANESTHESIA/SEDATION: Intravenous Fentanyl 148mg and Versed '2mg'$  were administered as conscious sedation during continuous monitoring of the patient's level of consciousness and physiological / cardiorespiratory status by the radiology RN, with a total moderate sedation time of 12 minutes. PROCEDURE: The  procedure, risks, benefits, and alternatives were explained to the patient. Questions regarding the procedure were encouraged and answered. The patient understands and consents to the procedure. Survey ultrasound of the right neck performed. Hypoechoic adenopathy was localized. An appropriate skin site was determined. The operative field was prepped with chlorhexidine in a sterile fashion, and a sterile drape was applied covering the operative field. A sterile gown and sterile gloves were used for the procedure. Local anesthesia was provided with 1% Lidocaine. Under real-time ultrasound guidance, multiple 18 gauge core biopsy samples were obtained with a not of a needle, submitted to surgical pathology. COMPLICATIONS: None immediate. FINDINGS: Hypoechoic right cervical adenopathy localized. Representative core biopsy samples obtained as above. IMPRESSION: 1. Technically successful ultrasound-guided core biopsy, right neck mass. Electronically Signed   By: DLucrezia EuropeM.D.   On: 06/10/2022 14:41   CT Soft Tissue Neck W Contrast  Result Date: 06/06/2022 CLINICAL DATA:  Right neck mass non pulsatile EXAM: CT NECK WITH CONTRAST TECHNIQUE: Multidetector CT imaging of the neck was performed using the standard protocol following the bolus administration of intravenous contrast. RADIATION DOSE REDUCTION: This exam was performed according to the departmental dose-optimization program which includes automated exposure control, adjustment of the mA and/or kV according to patient size and/or use of iterative reconstruction technique. CONTRAST:  827mOMNIPAQUE IOHEXOL 300 MG/ML  SOLN COMPARISON:  None Available. FINDINGS: Pharynx and larynx: Normal. No mass or swelling. Salivary glands: No inflammation, mass, or stone. Thyroid: 11 mm right thyroid nodule. No further imaging necessary (ref: J Am Coll Radiol. 2015 Feb;12(2): 143-50). Lymph nodes: Enlarged lymph nodes right neck. Lymph node just below the parotid gland measures  21 x 25 mm. Below this and superficial to the sternocleidomastoid muscle is a lymph node measuring 12.5 mm. Right level 2 lymph node 10 mm. Small right supraclavicular lymph nodes measuring 10 mm. 10 mm lymph node posterior to the left sternocleidomastoid muscle below the hyoid bone. Vascular: Normal vascular enhancement Limited intracranial: Negative Visualized orbits: Negative Mastoids and visualized paranasal sinuses: Mild mucosal edema left maxillary sinus. Remaining sinuses clear Skeleton: Cervical spondylosis.  No acute abnormality. Upper chest: Lung apices clear bilaterally Other: None IMPRESSION: Multiple enlarged lymph nodes in the right neck. Recommend tissue sampling to rule out metastatic disease or lymphoma. No pharyngeal mass. Electronically Signed   By: ChFranchot Gallo.D.   On: 06/06/2022 15:38    Labs:  CBC: Recent Labs    03/22/22 0722 04/07/22 0928 06/10/22 1130 06/13/22 1318  WBC 4.5 5.1 3.7* 3.6*  HGB 13.1 13.3 13.4 13.3  HCT 40.5 42.2 42.5 40.3  PLT 220 261 203 198    COAGS: Recent Labs    06/10/22 1130  INR 1.0    BMP: Recent Labs    03/22/22 0722 04/07/22 0928 06/04/22 1732 06/10/22 1130 06/13/22 1318  NA 141 141  --  141 143  K 4.0 3.6  --  3.2* 3.9  CL 106 107  --  109 106  CO2 29 28  --  24 30  GLUCOSE 106* 96  --  93 97  BUN 15 15  --  14 13  CALCIUM 9.6 8.9  --  9.5 9.2  CREATININE 1.03* 0.97 1.10* 0.76 1.02*  GFRNONAA 58* >60  --  >60 59*    LIVER FUNCTION TESTS: Recent Labs    11/20/21 0836 03/22/22 0722 04/07/22 0928 06/13/22 1318  BILITOT 0.3 0.3 0.5 0.4  AST 13* 16 13* 16  ALT '9 15 17 11  '$ ALKPHOS 84 89 83 91  PROT 7.1 7.3 6.4* 7.1  ALBUMIN 3.8 4.0 3.3* 4.0    TUMOR MARKERS: No results for input(s): "AFPTM", "CEA", "CA199", "CHROMGRNA" in the last 8760 hours.  Assessment and Plan: 71 year old female with recently diagnosed T-cell lymphoma presents to IR for tunneled catheter with port placement at the request of Truitt Merle,  MD.  Risks and benefits of image guided port-a-catheter placement was discussed with the patient including, but not limited to bleeding, infection, pneumothorax, or fibrin sheath development and need for additional procedures.  All of the patient's questions were answered, patient is agreeable to proceed. Consent signed and in chart.   Thank you for this interesting consult.  I greatly enjoyed meeting Leonela Kivi and look forward to participating in their care.  A copy of this report was sent to the requesting provider on this date.  Electronically Signed: Tyson Alias, NP 06/24/2022, 12:24 PM   I spent a total of {New KNLZ:[id number]} {New Out-Pt:[id number]}  {Established Out-Pt:[id number]} in face to face in clinical consultation, greater than 50% of which was counseling/coordinating care for T-cell lymphoma.

## 2022-06-25 ENCOUNTER — Other Ambulatory Visit: Payer: Self-pay

## 2022-06-25 ENCOUNTER — Ambulatory Visit (HOSPITAL_COMMUNITY)
Admission: RE | Admit: 2022-06-25 | Discharge: 2022-06-25 | Disposition: A | Discharge status: Home / Self Care | Payer: PPO | Source: Ambulatory Visit | Attending: Hematology | Admitting: Hematology

## 2022-06-25 ENCOUNTER — Ambulatory Visit (HOSPITAL_COMMUNITY)
Admission: RE | Admit: 2022-06-25 | Discharge: 2022-06-25 | Disposition: A | Discharge status: Home / Self Care | Payer: PPO | Source: Ambulatory Visit

## 2022-06-25 ENCOUNTER — Encounter (HOSPITAL_COMMUNITY): Payer: Self-pay

## 2022-06-25 DIAGNOSIS — C8440 Peripheral T-cell lymphoma, not classified, unspecified site: Secondary | ICD-10-CM | POA: Diagnosis not present

## 2022-06-25 DIAGNOSIS — C8441 Peripheral T-cell lymphoma, not classified, lymph nodes of head, face, and neck: Secondary | ICD-10-CM

## 2022-06-25 DIAGNOSIS — C8442 Peripheral T-cell lymphoma, not classified, intrathoracic lymph nodes: Secondary | ICD-10-CM | POA: Diagnosis not present

## 2022-06-25 DIAGNOSIS — Z452 Encounter for adjustment and management of vascular access device: Secondary | ICD-10-CM | POA: Diagnosis not present

## 2022-06-25 DIAGNOSIS — Z853 Personal history of malignant neoplasm of breast: Secondary | ICD-10-CM | POA: Diagnosis not present

## 2022-06-25 HISTORY — PX: IR IMAGING GUIDED PORT INSERTION: IMG5740

## 2022-06-25 LAB — GLUCOSE, CAPILLARY: Glucose-Capillary: 98 mg/dL (ref 70–99)

## 2022-06-25 MED ORDER — LIDOCAINE HCL 1 % IJ SOLN
INTRAMUSCULAR | Status: AC | PRN
  Administered 2022-06-25: 10 mL via INTRADERMAL

## 2022-06-25 MED ORDER — FENTANYL CITRATE (PF) 100 MCG/2ML IJ SOLN: INTRAMUSCULAR | Status: AC | PRN

## 2022-06-25 MED ORDER — LIDOCAINE HCL 1 % IJ SOLN
INTRAMUSCULAR | Status: AC
  Filled 2022-06-25: qty 20

## 2022-06-25 MED ORDER — HEPARIN SOD (PORK) LOCK FLUSH 100 UNIT/ML IV SOLN
INTRAVENOUS | Status: AC | PRN
  Administered 2022-06-25: 500 [IU] via INTRAVENOUS
  Administered 2022-06-25: 500 [IU]

## 2022-06-25 MED ORDER — FENTANYL CITRATE (PF) 100 MCG/2ML IJ SOLN
INTRAMUSCULAR | Status: AC
  Filled 2022-06-25: qty 2

## 2022-06-25 MED ORDER — MIDAZOLAM HCL 2 MG/2ML IJ SOLN
INTRAMUSCULAR | Status: AC | PRN
  Administered 2022-06-25: 1 mg via INTRAVENOUS

## 2022-06-25 MED ORDER — SODIUM CHLORIDE 0.9 % IV SOLN: INTRAVENOUS | Status: DC

## 2022-06-25 MED ORDER — FENTANYL CITRATE (PF) 100 MCG/2ML IJ SOLN
INTRAMUSCULAR | Status: AC | PRN
  Administered 2022-06-25: 50 ug via INTRAVENOUS

## 2022-06-25 MED ORDER — HEPARIN SOD (PORK) LOCK FLUSH 100 UNIT/ML IV SOLN
INTRAVENOUS | Status: AC
  Filled 2022-06-25: qty 5

## 2022-06-25 MED ORDER — MIDAZOLAM HCL 2 MG/2ML IJ SOLN
INTRAMUSCULAR | Status: AC
  Filled 2022-06-25: qty 2

## 2022-06-25 NOTE — Discharge Instructions (Signed)
For questions /concerns may call Interventional Radiology at [phone] or  Interventional Radiology clinic [phone]   You may remove your dressing and shower tomorrow afternoon  DO NOT use EMLA cream for 2 weeks after port placement as the cream will remove surgical glue on your incision.    Implanted Port Insertion, Care After This sheet gives you information about how to care for yourself after your procedure. Your health care provider may also give you more specific instructions. If you have problems or questions, contact your health careprovider. What can I expect after the procedure? After the procedure, it is common to have: Discomfort at the port insertion site. Bruising on the skin over the port. This should improve over 3-4 days. Follow these instructions at home: Port care After your port is placed, you will get a manufacturer's information card. The card has information about your port. Keep this card with you at all times. Take care of the port as told by your health care provider. Ask your health care provider if you or a family member can get training for taking care of the port at home. A home health care nurse may also take care of the port. Make sure to remember what type of port you have. Incision care Follow instructions from your health care provider about how to take care of your port insertion site. Make sure you: Wash your hands with soap and water before and after you change your bandage (dressing). If soap and water are not available, use hand sanitizer. Change your dressing as told by your health care provider. Leave skin glue, or adhesive strips in place. These skin closures may need to stay in place for 2 weeks or longer.  Check your port insertion site every day for signs of infection. Check for:      - Redness, swelling, or pain.                     - Fluid or blood.      - Warmth.      - Pus or a bad smell. Activity Return to your normal activities as  told by your health care provider. Ask your health care provider what activities are safe for you. Do not lift anything that is heavier than 10 lb (4.5 kg), or the limit that you are told, until your health care provider says that it is safe. General instructions Take over-the-counter and prescription medicines only as told by your health care provider. Do not take baths, swim, or use a hot tub until your health care provider approves. Ask your health care provider if you may take showers. You may only be allowed to take sponge baths. Do not drive for 24 hours if you were given a sedative during your procedure. Wear a medical alert bracelet in case of an emergency. This will tell any health care providers that you have a port. Keep all follow-up visits as told by your health care provider. This is important. Contact a health care provider if: You cannot flush your port with saline as directed, or you cannot draw blood from the port. You have a fever or chills. You have redness, swelling, or pain around your port insertion site. You have fluid or blood coming from your port insertion site. Your port insertion site feels warm to the touch. You have pus or a bad smell coming from the port insertion site. Get help right away if: You have chest pain or shortness of   breath. You have bleeding from your port that you cannot control. Summary Take care of the port as told by your health care provider. Keep the manufacturer's information card with you at all times. Change your dressing as told by your health care provider. Contact a health care provider if you have a fever or chills or if you have redness, swelling, or pain around your port insertion site. Keep all follow-up visits as told by your health care provider. This information is not intended to replace advice given to you by your health care provider. Make sure you discuss any questions you have with your healthcare provider. Document Revised:  03/31/2018 Document Reviewed: 03/31/2018    Moderate Conscious Sedation, Adult, Care After This sheet gives you information about how to care for yourself after your procedure. Your health care provider may also give you more specific instructions. If you have problems or questions, contact your health careprovider. What can I expect after the procedure? After the procedure, it is common to have: Sleepiness for several hours. Impaired judgment for several hours. Difficulty with balance. Vomiting if you eat too soon. Follow these instructions at home: For the time period you were told by your health care provider: Rest. Do not participate in activities where you could fall or become injured. Do not drive or use machinery. Do not drink alcohol. Do not take sleeping pills or medicines that cause drowsiness. Do not make important decisions or sign legal documents. Do not take care of children on your own. Eating and drinking  Follow the diet recommended by your health care provider. Drink enough fluid to keep your urine pale yellow. If you vomit: Drink water, juice, or soup when you can drink without vomiting. Make sure you have little or no nausea before eating solid foods.  General instructions Take over-the-counter and prescription medicines only as told by your health care provider. Have a responsible adult stay with you for the time you are told. It is important to have someone help care for you until you are awake and alert. Do not smoke. Keep all follow-up visits as told by your health care provider. This is important. Contact a health care provider if: You are still sleepy or having trouble with balance after 24 hours. You feel light-headed. You keep feeling nauseous or you keep vomiting. You develop a rash. You have a fever. You have redness or swelling around the IV site. Get help right away if: You have trouble breathing. You have new-onset confusion at  home. Summary After the procedure, it is common to feel sleepy, have impaired judgment, or feel nauseous if you eat too soon. Rest after you get home. Know the things you should not do after the procedure. Follow the diet recommended by your health care provider and drink enough fluid to keep your urine pale yellow. Get help right away if you have trouble breathing or new-onset confusion at home. This information is not intended to replace advice given to you by your health care provider. Make sure you discuss any questions you have with your healthcare provider. Document Revised: 12/31/2019 Document Reviewed: 07/29/2019 [employer] Patient Education  2022 [employer]  

## 2022-06-25 NOTE — Procedures (Signed)
Interventional Radiology Procedure Note  Procedure: Single Lumen Power Port Placement    Access:  Right IJ vein.  Findings: Catheter tip positioned at SVC/RA junction. Port is ready for immediate use.   Complications: None  EBL: < 10 mL  Recommendations:  - Ok to shower in 24 hours - Do not submerge for 7 days - Routine line care   Victory Strollo T. Jani Ploeger, M.D Pager:  [phone]   

## 2022-06-26 ENCOUNTER — Other Ambulatory Visit: Payer: Self-pay

## 2022-06-26 DIAGNOSIS — M5416 Radiculopathy, lumbar region: Secondary | ICD-10-CM | POA: Diagnosis not present

## 2022-06-26 DIAGNOSIS — M47896 Other spondylosis, lumbar region: Secondary | ICD-10-CM | POA: Diagnosis not present

## 2022-06-26 LAB — SURGICAL PATHOLOGY

## 2022-06-27 ENCOUNTER — Inpatient Hospital Stay (HOSPITAL_BASED_OUTPATIENT_CLINIC_OR_DEPARTMENT_OTHER): Payer: PPO | Admitting: Hematology

## 2022-06-27 ENCOUNTER — Encounter: Payer: Self-pay | Admitting: Hematology

## 2022-06-27 DIAGNOSIS — C8448 Peripheral T-cell lymphoma, not classified, lymph nodes of multiple sites: Secondary | ICD-10-CM

## 2022-06-27 MED ORDER — PROCHLORPERAZINE MALEATE 10 MG PO TABS: 10.0000 mg | ORAL_TABLET | Freq: Four times a day (QID) | ORAL | 6 refills | Status: DC | PRN

## 2022-06-27 MED ORDER — SENNOSIDES-DOCUSATE SODIUM 8.6-50 MG PO TABS: 2.0000 | ORAL_TABLET | Freq: Every day | ORAL | 1 refills | Status: DC

## 2022-06-27 MED ORDER — LORATADINE 10 MG PO TABS: 10.0000 mg | ORAL_TABLET | Freq: Every day | ORAL | 0 refills | Status: DC

## 2022-06-27 MED ORDER — ACYCLOVIR 400 MG PO TABS: 400.0000 mg | ORAL_TABLET | Freq: Every day | ORAL | 3 refills | Status: DC

## 2022-06-27 MED ORDER — LIDOCAINE-PRILOCAINE 2.5-2.5 % EX CREA: TOPICAL_CREAM | CUTANEOUS | 3 refills | Status: DC

## 2022-06-27 MED ORDER — PREDNISONE 20 MG PO TABS: 60.0000 mg | ORAL_TABLET | Freq: Every day | ORAL | 0 refills | Status: DC

## 2022-06-27 MED ORDER — ONDANSETRON HCL 8 MG PO TABS: 8.0000 mg | ORAL_TABLET | Freq: Three times a day (TID) | ORAL | 1 refills | Status: DC | PRN

## 2022-06-27 MED ORDER — HYDROCODONE-ACETAMINOPHEN 5-325 MG PO TABS: 1.0000 | ORAL_TABLET | Freq: Four times a day (QID) | ORAL | 0 refills | Status: DC | PRN

## 2022-06-27 MED ORDER — PREDNISONE 20 MG PO TABS: 60.0000 mg | ORAL_TABLET | Freq: Every day | ORAL | 0 refills | Status: AC

## 2022-06-27 NOTE — Progress Notes (Incomplete)
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  CURRENT THERAPY:  ***  SUMMARY OF ONCOLOGIC HISTORY: Oncology History Overview Note  Cancer Staging Anal cancer ([place]) Staging form: Anus, AJCC 8th Edition - Clinical stage from 06/28/2019: Stage IIA (cT2, cN0, cM0) - Signed by Alla Feeling, NP on 06/28/2019  Breast cancer of upper-outer quadrant of left female breast [facility]) Staging form: Breast, AJCC 7th Edition - Clinical stage from 09/07/2013: Stage IIA (T2, N0, cM0) - Signed by [name], MD on 09/07/2013 - Pathologic: Stage IIA (T2, N0, cM0) - Signed by [name], MD on 09/07/2013  Breast cancer of upper-outer quadrant of right female breast [facility]) Staging form: Breast, AJCC 7th Edition - Clinical stage from 09/07/2013: Stage IA (T1a, N0, cM0) - Signed by [name], MD on 09/07/2013 - Pathologic: Stage IA (T1a, N0, cM0) - Signed by [name], MD on 09/07/2013    Breast cancer of upper-outer quadrant of left female breast ([place])  07/10/2007 Procedure   US biopsy showed invasive ductal carcinoma 0.8cm, Nottingham grade 3   07/30/2007 Surgery   She had left breast lumpectomy and LN biopsy which showed high grade invasive ductal cancer Nottingham grade 3, 2.7 cm, ER/PR/Her2 neu negative   11/24/2007 Surgery   Patient elected for bilateral mastectomy   09/07/2008 - 03/08/2009 Chemotherapy    dates are approximate: she received adriamycin and cytoxan followed by Taxol   03/08/2009 - 05/08/2009 Radiation Therapy   dates are approximate, she received XRT    07/09/2021 Imaging   CT CAP  IMPRESSION: 1. No evidence to suggest locally recurrent ianorectal neoplasm or metastatic disease in the chest, abdomen or pelvis. 2. Status post bilateral modified radical mastectomy and bilateral axillary lymph node dissection. 3. Three small supraumbilical ventral hernias containing only omental fat. No associated bowel incarceration or obstruction at this time. 4. Aortic atherosclerosis. 5. Additional incidental findings, as above.   Breast cancer of upper-outer quadrant of right female breast ([place])  07/10/2006 Procedure   stereotactic biopsy showed atypical hyperplasia   10/23/2006 Surgery   right lumpectomy showed invasive ductal ca (Nottingham grade 1)  19m and DCIS, ER/PR positive Her 2 negative   10/09/2007 - 10/08/2012 Chemotherapy   Patient was placed on Tamoxifen   03/24/2017 Imaging   No acute findings. No evidence of recurrent carcinoma or metastatic disease   07/09/2021 Imaging   CT CAP  IMPRESSION: 1. No evidence to suggest locally recurrent ianorectal neoplasm or metastatic disease in the chest, abdomen or pelvis. 2. Status post bilateral modified radical mastectomy and bilateral axillary lymph node dissection. 3. Three small supraumbilical ventral hernias containing only omental fat. No associated bowel incarceration or obstruction at this time. 4. Aortic atherosclerosis. 5. Additional incidental findings, as above.   Anal cancer ([place]  06/04/2019 Procedure   Colonoscopy per Dr. SAnson Fret Findings-the digital rectal exam revealed a 3 cm diameter firm rectal mass.  The mass was noncircumferential and located predominantly at the right bowel wall at the anorectal  junction. A nonobstructing mass was found at the anus and in the rectum    06/04/2019 Initial  Biopsy   Follow pathology: Large intestine, rectum biopsy: Invasive well to moderately differentiated squamous cell carcinoma.  No rectal mucosa present.  There is strong diffuse expression of P 16 immunostain.  CDX 2, p63 and mCEA immunostains are also used in the diagnostic work-up of the case.   06/04/2019 Initial Diagnosis   Anal cancer ([place])   06/21/2019 PET scan   IMPRESSION: 1. Anorectal primary. No hypermetabolic metastatic disease within the chest, abdomen, or pelvis. Perirectal nodes, at least 1 of which is new since 02/26/2018 CT, suspicious based on size and interval development. 2. Mild limitations secondary to beam hardening artifact from bilateral hip arthroplasty. 3.  Aortic Atherosclerosis (ICD10-I70.0).   06/28/2019 Cancer Staging   Staging form: Anus, AJCC 8th Edition - Clinical stage from 06/28/2019: Stage IIA (cT2, cN0, cM0) - Signed by Alla Feeling, NP on 06/28/2019   06/28/2019 - 07/26/2019 Chemotherapy   concurrent chemoRT with Mitomycin and 5FU on week 1 and week 5 on starting 06/28/19. Last dose on 07/26/19   06/28/2019 - 08/09/2019 Radiation Therapy   concurrent chemoRT with Dr [name] 06/28/19-08/09/19   11/01/2019 PET scan   IMPRESSION: 1. Marked interval decrease in hypermetabolism noted at the level of the anal rectal primary. No evidence for hypermetabolic metastatic disease in the chest, abdomen, or pelvis. The perirectal lymph nodes identified previously have resolved in the interval. 2.  Aortic Atherosclerois (ICD10-170.0)   08/07/2020 Imaging   CT CAP  IMPRESSION: 1. No findings for residual/recurrent anal tumor, regional lymphadenopathy or metastatic disease. 2. Status post cholecystectomy. No biliary dilatation. 3. Small anterior abdominal wall hernia containing fat. 4. Aortic atherosclerosis.   Aortic Atherosclerosis (ICD10-I70.0).     07/09/2021 Imaging   CT CAP  IMPRESSION: 1. No evidence to suggest locally recurrent ianorectal  neoplasm or metastatic disease in the chest, abdomen or pelvis. 2. Status post bilateral modified radical mastectomy and bilateral axillary lymph node dissection. 3. Three small supraumbilical ventral hernias containing only omental fat. No associated bowel incarceration or obstruction at this time. 4. Aortic atherosclerosis. 5. Additional incidental findings, as above.      INTERVAL HISTORY:  Tallyn Holroyd is here after her care was transferred to [name] from [name] MD for evaluation and management of her newly diagnosed T-cell lymphoma. She was last seen by [name] MD on 06/13/2022. She reports She is doing well.  ***  She notes port insertion scheduled 06/25/2022.  We discussed the natural staging, symptoms, and various treatment options in depth and all of her questions and concerns were addressed.  We discussed her cervical node biopsy done 06/21/2022 which reviewed as noted below.  We discussed her PET/CT scan done 06/19/2022 and was reviewed as otherwise noted below.  No other new or acute focal symptoms.  MEDICAL HISTORY:  Past Medical History:  Diagnosis Date  . Anal cancer ([place]) dx'd 05/2019  . Anxiety   . Arthritis   . Bilateral breast cancer ([place]) 10/21/2013  . Breast cancer ([place])   . Cancer ([place])   . Depression   . Gallstones   . GERD (gastroesophageal reflux disease)    doesn't take any meds for this  . Headache    h/o migraines      . History of bladder infections   . History of hiatal hernia   . History of migraine   . Hyperlipidemia    takes Simvastatin  daily  . Hypertension    takes Hyzaar  . Joint pain   . Joint swelling   . Lumbar stenosis   . Neuropathy 09/07/2013  . Pneumonia   . Pre-diabetes     SURGICAL HISTORY: Past Surgical History:  Procedure Laterality Date  . ABDOMINAL HYSTERECTOMY    . bone spur removed from left foot    . CHOLECYSTECTOMY    . COLONOSCOPY    . COLONOSCOPY WITH PROPOFOL Bilateral 12/07/2021   Procedure:  COLONOSCOPY WITH PROPOFOL;  Surgeon: Clarene Essex, MD;  Location: [facility];  Service: Endoscopy;  Laterality: Bilateral;  . double mastectomy     . ESOPHAGOGASTRODUODENOSCOPY N/A 12/07/2021   Procedure: ESOPHAGOGASTRODUODENOSCOPY (EGD);  Surgeon: Clarene Essex, MD;  Location: [facility];  Service: Endoscopy;  Laterality: N/A;  . HERNIA REPAIR     umbilical  . IR IMAGING GUIDED PORT INSERTION  06/25/2022  . right knee arthroscopy    . right shoulder arthroscopy    . surgery for hiatal hernia    . TONSILLECTOMY    . TOTAL HIP ARTHROPLASTY Left 02/12/2013  . TOTAL HIP ARTHROPLASTY Left 02/12/2013   Procedure: LEFT TOTAL HIP ARTHROPLASTY;  Surgeon: Kerin Salen, MD;  Location: [facility];  Service: Orthopedics;  Laterality: Left;  . TOTAL HIP ARTHROPLASTY Right 05/03/2013   Procedure: TOTAL HIP ARTHROPLASTY;  Surgeon: Kerin Salen, MD;  Location: [facility];  Service: Orthopedics;  Laterality: Right;  . TOTAL KNEE ARTHROPLASTY Right 02/27/2015   Procedure: TOTAL KNEE ARTHROPLASTY;  Surgeon: Frederik Pear, MD;  Location: [facility];  Service: Orthopedics;  Laterality: Right;  . TOTAL SHOULDER ARTHROPLASTY Right 09/12/2016   Procedure: RIGHT TOTAL SHOULDER ARTHROPLASTY;  Surgeon: Tania Ade, MD;  Location: [facility];  Service: Orthopedics;  Laterality: Right;  RIGHT TOTAL SHOULDER ARTHROPLASTY    I have reviewed the social history and family history with the patient and they are unchanged from previous note.  ALLERGIES:  is allergic to oxycodone-acetaminophen, tramadol, codeine, and vicodin [hydrocodone-acetaminophen].  MEDICATIONS:  Current Outpatient Medications  Medication Sig Dispense Refill  . aspirin-acetaminophen-caffeine (EXCEDRIN MIGRAINE) 250-250-65 MG tablet Take 2 tablets by mouth every 6 (six) hours as needed for headache.    [name] Kitchen LINZESS 72 MCG capsule Take 72 mcg by mouth daily as needed (constipation).    [name] Kitchen losartan (COZAAR) 100 MG tablet Take 100 mg by mouth daily.  6  . meclizine (ANTIVERT)  12.5 MG tablet Take 1 tablet (12.5 mg total) by mouth 3 (three) times daily as needed for dizziness. 30 tablet 0  . Menthol-Methyl Salicylate (SALONPAS PAIN RELIEF PATCH EX) Apply 1 patch topically daily as needed (pain).    . rosuvastatin (CRESTOR) 40 MG tablet Take 40 mg by mouth daily.     No current facility-administered medications for this visit.    PHYSICAL EXAMINATION: ECOG PERFORMANCE STATUS: 1 - Symptomatic but completely ambulatory  Vitals:   06/21/22 1405  BP: (!) 158/100  Pulse: 78  Resp: 16  Temp: 98.3 F (36.8 C)  SpO2: 91%   Wt Readings from Last 3 Encounters:  06/25/22 218 lb 4.1 oz (99 kg)  06/21/22 218 lb 14.4 oz (99.3 kg)  06/13/22 216 lb 1.6 oz (98 kg)    NAD*** GENERAL:alert, in no acute distress and comfortable SKIN: no acute rashes, no significant lesions EYES: conjunctiva are pink and non-injected, sclera anicteric NECK: supple, no JVD LYMPH:  no palpable lymphadenopathy in the cervical, axillary or inguinal regions LUNGS: clear to auscultation b/l with normal respiratory effort  HEART: regular rate & rhythm ABDOMEN:  normoactive bowel sounds , non tender, not distended. Extremity: no pedal edema PSYCH: alert & oriented x 3 with fluent speech NEURO: no focal motor/sensory deficits  LABORATORY DATA:  I have reviewed the data as listed    Latest Ref Rng & Units 06/13/2022    1:18 PM 06/10/2022   11:30 AM 04/07/2022    9:28 AM  CBC  WBC 4.0 - 10.5 K/uL 3.6  3.7  5.1   Hemoglobin 12.0 - 15.0 g/dL 13.3  13.4  13.3   Hematocrit 36.0 - 46.0 % 40.3  42.5  42.2   Platelets 150 - 400 K/uL 198  203  261         Latest Ref Rng & Units 06/13/2022    1:18 PM 06/10/2022   11:30 AM 06/04/2022    5:32 PM  CMP  Glucose 70 - 99 mg/dL 97  93    BUN 8 - 23 mg/dL 13  14    Creatinine 0.44 - 1.00 mg/dL 1.02  0.76  1.10   Sodium 135 - 145 mmol/L 143  141    Potassium 3.5 - 5.1 mmol/L 3.9  3.2    Chloride 98 - 111 mmol/L 106  109    CO2 22 - 32 mmol/L 30  24     Calcium 8.9 - 10.3 mg/dL 9.2  9.5    Total Protein 6.5 - 8.1 g/dL 7.1     Total Bilirubin 0.3 - 1.2 mg/dL 0.4     Alkaline Phos 38 - 126 U/L 91     AST 15 - 41 U/L 16     ALT 0 - 44 U/L 11      Cervical node biopsy done 06/21/2022 revealed "DIAGNOSIS:   - Abnormal population of T cells with bright CD5, CD3 and CD4 expression  and loss of CD2 and CD7 "   RADIOGRAPHIC STUDIES: I have personally reviewed the radiological images as listed and agreed with the findings in the report.  IR IMAGING GUIDED PORT INSERTION  Result Date: 06/25/2022 CLINICAL DATA:  T-cell lymphoma and need for porta cath for chemotherapy. EXAM: IMPLANTED PORT A CATH PLACEMENT WITH ULTRASOUND AND FLUOROSCOPIC GUIDANCE ANESTHESIA/SEDATION: Moderate (conscious) sedation was employed during this procedure. A total of Versed 2.0 mg and Fentanyl 100 mcg was administered intravenously by radiology nursing. Moderate Sedation Time: 37 minutes. The patient's level of consciousness and vital signs were monitored continuously by radiology nursing throughout the procedure under my direct supervision. FLUOROSCOPY: 30 seconds.  3.0 mGy. PROCEDURE: The procedure, risks, benefits, and alternatives were explained to the patient. Questions regarding the procedure were encouraged and answered. The patient understands and consents to the procedure. A time-out was performed prior to initiating the procedure. Ultrasound was utilized to confirm patency of the right internal jugular vein. A permanent ultrasound image was recorded. The right neck and chest were prepped with chlorhexidine in a sterile fashion, and a sterile drape was applied covering the operative field. Maximum barrier sterile technique with sterile gowns and gloves were used for the procedure. Local anesthesia was provided with 1% lidocaine. After creating a small venotomy incision, a 21 gauge needle was advanced into the right internal jugular vein under direct, real-time ultrasound  guidance. Ultrasound image documentation was performed. After securing guidewire access, an 8 Fr dilator was placed. A J-wire was kinked to measure appropriate catheter length. A subcutaneous port pocket was then created along the upper chest wall utilizing sharp and blunt dissection.  Portable cautery was utilized. The pocket was irrigated with sterile saline. A single lumen power injectable port was chosen for placement. The 8 Fr catheter was tunneled from the port pocket site to the venotomy incision. The port was placed in the pocket. External catheter was trimmed to appropriate length based on guidewire measurement. At the venotomy, an 8 Fr peel-away sheath was placed over a guidewire. The catheter was then placed through the sheath and the sheath removed. Final catheter positioning was confirmed and documented with a fluoroscopic spot image. The port was accessed with a needle and aspirated and flushed with heparinized saline. The access needle was removed. The venotomy and port pocket incisions were closed with subcutaneous 3-0 Monocryl and subcuticular 4-0 Vicryl. Dermabond was applied to both incisions. COMPLICATIONS: COMPLICATIONS None FINDINGS: After catheter placement, the tip lies at the cavo-atrial junction. The catheter aspirates normally and is ready for immediate use. IMPRESSION: Placement of single lumen port a cath via right internal jugular vein. The catheter tip lies at the cavo-atrial junction. A power injectable port a cath was placed and is ready for immediate use. Electronically Signed   By: Aletta Edouard M.D.   On: 06/25/2022 17:22   NM PET Image Restag (PS) Skull Base To Thigh  Result Date: 06/19/2022 CLINICAL DATA:  Initial treatment strategy for T-cell lymphoma. Also history of anal cancer and remote history of breast cancer EXAM: NUCLEAR MEDICINE PET SKULL BASE TO THIGH TECHNIQUE: 10.7 mCi F-18 FDG was injected intravenously. Full-ring PET imaging was performed from the skull base  to thigh after the radiotracer. CT data was obtained and used for attenuation correction and anatomic localization. Fasting blood glucose: 97 mg/dl COMPARISON:  Multiple exams, including CT neck 06/04/2022; CT scan 07/09/2021; PET-CT 11/01/2019 FINDINGS: Mediastinal blood pool activity: SUV max 2.6 Liver activity: SUV max 4.0 NECK: Hypermetabolic right infra-auricular lymph node along with hypermetabolic right mandibular node, right submandibular lymph node, right level IIa adenopathy, bilateral level III and IV adenopathy as well as a right V lymph node. Index right infra-auricular node measures 1.8 cm in short axis with maximum SUV 14.4, Deauville 5. This previously measured 1.8 cm in short axis on 06/04/2022 and was not present on the prior PET-CT. Index left level III lymph node measures 0.9 cm in short axis on image 42 series 4 with maximum SUV 8.4, [place] 5. Index right level IV lymph node on image 45 series 4 measures 1.3 cm in short axis with maximum SUV 11.2, [place] 5. Incidental CT findings: Small mucous retention cyst of the left maxillary sinus. CHEST: No significant abnormal hypermetabolic activity in this region. Incidental CT findings: Bilateral mastectomies. Thoracic aortic atherosclerosis. ABDOMEN/PELVIS: Small focus of hypermetabolic activity in segment 4 of the liver just above the level of the gallbladder fossa, maximum SUV 5.4 which is mildly above background liver activity (Deauville 4). This focus measures only about 1 cm in diameter. An early metastatic focus is not excluded. Incidental CT findings: Cholecystectomy. Pelvis partially obscured on the CT dated due to streak artifact from bilateral hip implants. Small supraumbilical hernias contain adipose tissue. SKELETON: Accentuated activity around the right shoulder prosthesis, considered physiologic. There is accentuated activity at the severe degenerative findings of the left glenohumeral joint, considered benign. Incidental CT  findings: Bilateral hip prostheses. Dextroconvex lumbar scoliosis with rotary component. IMPRESSION: 1. Hypermetabolic bilateral neck adenopathy, Deauville 5, compatible with active malignancy. 2. New approximately 1 cm focus of mildly accentuated metabolic activity in segment 4 of the liver, De[place]ville 4.  Early metastatic focus is a distinct possibility. Hepatic protocol MRI with and without contrast could be utilized to confirm the presence of a corresponding lesion, which would seemingly be more likely to be related to the patient's anal carcinoma rather than lymphoma based on location. 3. Other imaging findings of potential clinical significance: Mild chronic left maxillary sinusitis. Aortic Atherosclerosis (ICD10-I70.0). Lumbar scoliosis. Small supraumbilical hernias contain adipose tissue. Electronically Signed   By: Van Clines M.D.   On: 06/19/2022 11:40   ECHOCARDIOGRAM COMPLETE  Result Date: 06/17/2022    ECHOCARDIOGRAM REPORT   Patient Name:   DELA SWEENY Date of Exam: 06/17/2022 Medical Rec #:  [id number]     Height:       65.0 in Accession #:    [id number]    Weight:       216.1 lb Date of Birth:  1951/05/19    BSA:          2.044 m Patient Age:    71 years      BP:           151/88 mmHg Patient Gender: F             HR:           65 bpm. Exam Location:  Outpatient Procedure: 2D Echo, 3D Echo, Color Doppler, Cardiac Doppler and Strain Analysis Indications:    Chemo Z09  History:        Patient has prior history of Echocardiogram examinations, most                 recent 11/24/2019. Risk Factors:Hypertension and Dyslipidemia.                 Past history of breast cancer.  Sonographer:    [name] RDCS Referring Phys: [id number] [facility]  Sonographer Comments: Suboptimal parasternal window. IMPRESSIONS  1. Left ventricular ejection fraction, by estimation, is 70 to 75%. The left ventricle has hyperdynamic function. The left ventricle has no regional wall motion abnormalities. Left  ventricular diastolic parameters are consistent with Grade I diastolic dysfunction (impaired relaxation). The average left ventricular global longitudinal strain is -21.7 %. The global longitudinal strain is normal.  2. Right ventricular systolic function is normal. The right ventricular size is normal. Tricuspid regurgitation signal is inadequate for assessing PA pressure.  3. The mitral valve is grossly normal. No evidence of mitral valve regurgitation.  4. The aortic valve is tricuspid. Aortic valve regurgitation is not visualized.  5. The inferior vena cava is normal in size with greater than 50% respiratory variability, suggesting right atrial pressure of 3 mmHg. Comparison(s): Changes from prior study are noted. 11/24/2019: LVEF 60-65%, GLS 23.2%. FINDINGS  Left Ventricle: Left ventricular ejection fraction, by estimation, is 70 to 75%. The left ventricle has hyperdynamic function. The left ventricle has no regional wall motion abnormalities. The average left ventricular global longitudinal strain is -21.7  %. The global longitudinal strain is normal. The left ventricular internal cavity size was normal in size. There is no left ventricular hypertrophy. Left ventricular diastolic parameters are consistent with Grade I diastolic dysfunction (impaired relaxation). Indeterminate filling pressures. Right Ventricle: The right ventricular size is normal. No increase in right ventricular wall thickness. Right ventricular systolic function is normal. Tricuspid regurgitation signal is inadequate for assessing PA pressure. Left Atrium: Left atrial size was normal in size. Right Atrium: Right atrial size was normal in size. Pericardium: There is no evidence of pericardial effusion. Mitral Valve: The mitral valve  is grossly normal. No evidence of mitral valve regurgitation. Tricuspid Valve: The tricuspid valve is grossly normal. Tricuspid valve regurgitation is not demonstrated. Aortic Valve: The aortic valve is tricuspid.  Aortic valve regurgitation is not visualized. Pulmonic Valve: The pulmonic valve was normal in structure. Pulmonic valve regurgitation is not visualized. Aorta: The aortic root and ascending aorta are structurally normal, with no evidence of dilitation. Venous: The inferior vena cava is normal in size with greater than 50% respiratory variability, suggesting right atrial pressure of 3 mmHg. IAS/Shunts: No atrial level shunt detected by color flow Doppler.  LEFT VENTRICLE PLAX 2D LVIDd:         3.50 cm   Diastology LVIDs:         2.10 cm   LV e' medial:    4.82 cm/s LV PW:         0.90 cm   LV E/e' medial:  10.4 LV IVS:        1.10 cm   LV e' lateral:   5.34 cm/s LVOT diam:     1.90 cm   LV E/e' lateral: 9.4 LV SV:         58 LV SV Index:   28        2D Longitudinal Strain LVOT Area:     2.84 cm  2D Strain GLS (A2C):   -21.4 %                          2D Strain GLS (A3C):   -20.2 %                          2D Strain GLS (A4C):   -23.6 %                          2D Strain GLS Avg:     -21.7 % RIGHT VENTRICLE RV S prime:     12.70 cm/s TAPSE (M-mode): 1.5 cm LEFT ATRIUM             Index        RIGHT ATRIUM          Index LA diam:        2.80 cm 1.37 cm/m   RA Area:     9.69 cm LA Vol (A2C):   32.5 ml 15.90 ml/m  RA Volume:   16.70 ml 8.17 ml/m LA Vol (A4C):   31.9 ml 15.60 ml/m LA Biplane Vol: 32.3 ml 15.80 ml/m  AORTIC VALVE LVOT Vmax:   104.00 cm/s LVOT Vmean:  69.800 cm/s LVOT VTI:    0.205 m  AORTA Ao Root diam: 3.30 cm Ao Asc diam:  3.40 cm MITRAL VALVE MV Area (PHT): 2.21 cm    SHUNTS MV Decel Time: 344 msec    Systemic VTI:  0.20 m MV E velocity: 50.10 cm/s  Systemic Diam: 1.90 cm MV A velocity: 82.70 cm/s MV E/A ratio:  0.61 Lyman Bishop MD Electronically signed by Lyman Bishop MD Signature Date/Time: 06/17/2022/3:45:06 PM    Final    [name] CORE BIOPSY (LYMPH NODES)  Result Date: 06/10/2022 INDICATION: Nonpainful right neck mass.  No history of primary malignancy EXAM: ULTRASOUND GUIDED CORE BIOPSY  OF RIGHT NECK MASS MEDICATIONS: Lidocaine 1% subcutaneous ANESTHESIA/SEDATION: Intravenous Fentanyl 170mg and Versed 268mwere administered as conscious sedation during continuous monitoring of the patient's level of consciousness and physiological / cardiorespiratory  status by the radiology RN, with a total moderate sedation time of 12 minutes. PROCEDURE: The procedure, risks, benefits, and alternatives were explained to the patient. Questions regarding the procedure were encouraged and answered. The patient understands and consents to the procedure. Survey ultrasound of the right neck performed. Hypoechoic adenopathy was localized. An appropriate skin site was determined. The operative field was prepped with chlorhexidine in a sterile fashion, and a sterile drape was applied covering the operative field. A sterile gown and sterile gloves were used for the procedure. Local anesthesia was provided with 1% Lidocaine. Under real-time ultrasound guidance, multiple 18 gauge core biopsy samples were obtained with a not of a needle, submitted to surgical pathology. COMPLICATIONS: None immediate. FINDINGS: Hypoechoic right cervical adenopathy localized. Representative core biopsy samples obtained as above. IMPRESSION: 1. Technically successful ultrasound-guided core biopsy, right neck mass. Electronically Signed   By: Lucrezia Europe M.D.   On: 06/10/2022 14:41   CT Soft Tissue Neck W Contrast  Result Date: 06/06/2022 CLINICAL DATA:  Right neck mass non pulsatile EXAM: CT NECK WITH CONTRAST TECHNIQUE: Multidetector CT imaging of the neck was performed using the standard protocol following the bolus administration of intravenous contrast. RADIATION DOSE REDUCTION: This exam was performed according to the departmental dose-optimization program which includes automated exposure control, adjustment of the mA and/or kV according to patient size and/or use of iterative reconstruction technique. CONTRAST:  49m OMNIPAQUE IOHEXOL  300 MG/ML  SOLN COMPARISON:  None Available. FINDINGS: Pharynx and larynx: Normal. No mass or swelling. Salivary glands: No inflammation, mass, or stone. Thyroid: 11 mm right thyroid nodule. No further imaging necessary (ref: J Am Coll Radiol. 2015 Feb;12(2): 143-50). Lymph nodes: Enlarged lymph nodes right neck. Lymph node just below the parotid gland measures 21 x 25 mm. Below this and superficial to the sternocleidomastoid muscle is a lymph node measuring 12.5 mm. Right level 2 lymph node 10 mm. Small right supraclavicular lymph nodes measuring 10 mm. 10 mm lymph node posterior to the left sternocleidomastoid muscle below the hyoid bone. Vascular: Normal vascular enhancement Limited intracranial: Negative Visualized orbits: Negative Mastoids and visualized paranasal sinuses: Mild mucosal edema left maxillary sinus. Remaining sinuses clear Skeleton: Cervical spondylosis.  No acute abnormality. Upper chest: Lung apices clear bilaterally Other: None IMPRESSION: Multiple enlarged lymph nodes in the right neck. Recommend tissue sampling to rule out metastatic disease or lymphoma. No pharyngeal mass. Electronically Signed   By: CFranchot GalloM.D.   On: 06/06/2022 15:38   MR LUMBAR SPINE WO CONTRAST  Result Date: 04/18/2022 CLINICAL DATA:  Low back and left leg pain.  Weakness.  Areflexia. EXAM: MRI LUMBAR SPINE WITHOUT CONTRAST TECHNIQUE: Multiplanar, multisequence MR imaging of the lumbar spine was performed. No intravenous contrast was administered. COMPARISON:  Lumbar spine MRI 02/05/2021 FINDINGS: Segmentation:  5 lumbar vertebrae.  Rudimentary S1-2 disc. Alignment: Moderate upper lumbar dextroscoliosis. Unchanged grade 1 anterolisthesis of L3 on L4 and L4 on L5. Vertebrae: No fracture or suspicious marrow lesion. Persistent mild right facet edema at L4-5. Conus medullaris and cauda equina: Conus extends to the L1 level. Conus and cauda equina appear normal. Paraspinal and other soft tissues: Small bilateral  renal cysts. Disc levels: Disc desiccation and mild-to-moderate disc space narrowing throughout the lumbar and included lower thoracic spine. T10-11: Only imaged sagittally. Mild disc bulging and moderate to severe right facet arthrosis result in mild-to-moderate right neural foraminal stenosis without spinal stenosis, unchanged. T11-12: Only imaged sagittally. Disc bulging and moderate to severe right and moderate  left facet arthrosis result in moderate to severe right neural foraminal stenosis without spinal stenosis, unchanged. T12-L1: Disc bulging, a left paracentral to left subarticular disc protrusion, and moderate left greater than right facet hypertrophy result in mild left lateral recess stenosis and mild left neural foraminal stenosis without spinal stenosis, unchanged. L1-2: Left eccentric disc bulging and moderate left greater than right facet and ligamentum flavum hypertrophy result in mild left lateral recess stenosis without spinal or neural foraminal stenosis, unchanged. L2-3: Disc bulging, a left foraminal disc protrusion, and moderate right and severe left facet and ligamentum flavum hypertrophy result in borderline spinal stenosis, mild left lateral recess stenosis, and moderate left neural foraminal stenosis, unchanged. L3-4: Anterolisthesis with left eccentric bulging of uncovered disc, a left foraminal disc protrusion, and severe facet and ligamentum flavum hypertrophy result in mild-to-moderate spinal stenosis, mild-to-moderate left greater than right lateral recess stenosis, and mild right and severe left neural foraminal stenosis, stable to minimally progressed. Potential left L3 nerve root impingement. L4-5: Anterolisthesis with bulging uncovered disc and severe right greater than left facet and ligamentum flavum hypertrophy result in mild-to-moderate spinal stenosis, mild-to-moderate bilateral lateral recess stenosis, and the moderate bilateral neural foraminal stenosis, unchanged. L5-S1:  Bilateral facet ankylosis.  Mild disc bulging.  No stenosis. IMPRESSION: Diffuse lumbar disc and facet degeneration without significant interval change resulting in mild-to-moderate spinal stenosis and up to severe neural foraminal stenosis as above. Electronically Signed   By: Logan Bores M.D.   On: 04/18/2022 09:08   CT ABDOMEN PELVIS W CONTRAST  Result Date: 04/07/2022 CLINICAL DATA:  Sciatica 1 week now with nausea and vomiting as well as left lower quadrant pain. History of anal carcinoma and remote history of breast cancer. EXAM: CT ABDOMEN AND PELVIS WITH CONTRAST TECHNIQUE: Multidetector CT imaging of the abdomen and pelvis was performed using the standard protocol following bolus administration of intravenous contrast. RADIATION DOSE REDUCTION: This exam was performed according to the departmental dose-optimization program which includes automated exposure control, adjustment of the mA and/or kV according to patient size and/or use of iterative reconstruction technique. CONTRAST:  123m OMNIPAQUE IOHEXOL 300 MG/ML  SOLN COMPARISON:  07/09/2021 FINDINGS: Lower chest: Lung bases are clear. Hepatobiliary: Previous cholecystectomy. Liver and biliary tree are normal. Pancreas: Normal. Spleen: Normal. Adrenals/Urinary Tract: Adrenal glands are normal. Kidneys are normal in size with a few bilateral cysts unchanged. Visualized ureters and bladder are unremarkable, although the distal ureters and portions of the bladder are obscured due to streak artifact from bilateral hip prostheses. Stomach/Bowel: Stomach and small bowel are normal. Appendix is normal. Colon is unremarkable. Likely post treatment changes over the anorectal junction which is stable. Vascular/Lymphatic: Mild calcified plaque over the abdominal aorta which is normal in caliber. Remaining vascular structures are unremarkable. No adenopathy. Reproductive: Previous hysterectomy.  Ovaries not visualized. Other: No free fluid or focal inflammatory  change. Cluster of small ventral hernias over the midline above the umbilicus containing only peritoneal fat without significant change. Musculoskeletal: No focal abnormality. Degenerative changes of the spine. Bilateral hip arthroplasties unchanged. IMPRESSION: 1. No acute findings in the abdomen/pelvis. 2. Bilateral renal cysts unchanged. 3. Cluster of small ventral hernias over the midline above the umbilicus containing only peritoneal fat without significant change. 4. Aortic atherosclerosis. Aortic Atherosclerosis (ICD10-I70.0). Electronically Signed   By: DMarin OlpM.D.   On: 04/07/2022 11:16   DG Chest 2 View  Result Date: 04/07/2022 CLINICAL DATA:  Pt states she has been having some really bad sciatica pain for  over a week. Pt now reports nausea, vomiting, and left lower abdominal pain for the past couple of days. EXAM: CHEST - 2 VIEW COMPARISON:  09/05/2016.  CT, 07/09/2021. FINDINGS: Cardiac silhouette is normal in size. No mediastinal or hilar masses. No evidence of adenopathy. Clear lungs.  No pleural effusion or pneumothorax. Bilateral mastectomies. Right shoulder arthroplasty, incompletely imaged, appearing well aligned. No acute skeletal abnormality. IMPRESSION: No active cardiopulmonary disease. Electronically Signed   By: Lajean Manes M.D.   On: 04/07/2022 11:03    ASSESSMENT & PLAN:  Kyleigha Markert is a 71 y.o. female with   1. Newly diagnosed T-cell lymphoma  -neck CT on 06/04/22 showed: multiple enlarged lymph nodes in right neck; no pharyngeal mass. -We discussed her cervical node biopsy done 06/21/2022 which revealed abnormal population of T cells with bright CD5, CD3, and CD4 exprssion with loss of CD2 and CD7. Consistent with T-cell lymphoma diagnosis. Otherwise reviewed as noted above.   2. Anal Squamous Cell Carcinoma, cT2N0M0 -diagnosed in 05/2019. Workup showed a 3 cm mass at the anorectal junction, biopsy confirmed well to moderately differentiated squamous cell  carcinoma. 06/21/19 PET scan showed no metastasis.  -S/p concurrent chemoRT with Mitomycin and 5FU on 08/09/19. Now on surveillance. -surveillance CT CAP 07/09/21 was NED. -surveillance colonoscopy done for rectal bleeding on 12/07/21 showed only radiation changes. Her rectal bleeding has resolved. -she is now three years out from diagnosis.    3. 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 She was adopted, no known family history       PLAN: -Port insertion scheduled 06/25/2022 -We discussed her cervical node biopsy done 06/21/2022 reviewed as noted above. -We discussed her PET/CT done 06/19/2022 which revealed hypermetabolic b/l neck adenopathy. New 1 cm mildly accentuated metabolic activity in segment 4 of liver. Early metastatic focus is a possibility. Otherwise reviewed as noted above.   FOLLOW UP: ***  No orders of the defined types were placed in this encounter.  All questions were answered. The patient knows to call the clinic with any problems, questions or concerns. No barriers to learning was detected. The total time spent in the appointment was 40 minutes.  I, Melene Muller, am acting as scribe for Dr. Sullivan Lone, MD.

## 2022-06-27 NOTE — Progress Notes (Addendum)
START ON PATHWAY REGIMEN - Lymphoma and CLL     A cycle is every 21 days:     Prednisone      Cyclophosphamide      Doxorubicin      Vincristine      Etoposide   **Always confirm dose/schedule in your pharmacy ordering system**  Patient Characteristics: T-Cell Lymphoma (Systemic), First Line, AITL (Angioimmunoblastic T-Cell Lymphoma) or ALCL (Anaplastic Large Cell Lymphoma) or Peripheral T-Cell, NOS, CD30 Negative Disease Type: Not Applicable Disease Type: T-Cell Lymphoma (Systemic) Disease Type: Not Applicable Line of Therapy: First Line T-Cell Lymphoma Subtype: Peripheral T-Cell Lymphoma, NOS CD30 Status: CD30 Negative Intent of Therapy: Curative Intent, Discussed with Patient

## 2022-06-28 ENCOUNTER — Encounter: Payer: Self-pay | Admitting: Hematology

## 2022-06-28 NOTE — Progress Notes (Signed)
[place]   [phone] Fax:(336) [phone]   Clinic Follow up Note   Patient Care Team: Brunetta Genera, MD as PCP - Hematology/Oncology (Hematology) Wonda Horner, MD as Consulting Physician (Gastroenterology) Truitt Merle, MD as Consulting Physician (Hematology) Alla Feeling, NP as Nurse Practitioner (Nurse Practitioner) Kyung Rudd, MD as Consulting Physician (Radiation Oncology) Michael Boston, MD as Consulting Physician (General Surgery) Izora Gala, MD as Attending Physician (Otolaryngology)  Date of Service:  06/21/2022  CHIEF COMPLAINT: f/u of newly diagnosed T cell lymphoma  CURRENT THERAPY:  PendingTBD  SUMMARY OF ONCOLOGIC HISTORY: Oncology History Overview Note  Cancer Staging Anal cancer [facility]) Staging form: Anus, AJCC 8th Edition - Clinical stage from 06/28/2019: Stage IIA (cT2, cN0, cM0) - Signed by Alla Feeling, NP on 06/28/2019  Breast cancer of upper-outer quadrant of left female breast [facility]) Staging form: Breast, AJCC 7th Edition - Clinical stage from 09/07/2013: Stage IIA (T2, N0, cM0) - Signed by [name], MD on 09/07/2013 - Pathologic: Stage IIA (T2, N0, cM0) - Signed by [name], MD on 09/07/2013  Breast cancer of upper-outer quadrant of right female breast [facility]) Staging form: Breast, AJCC 7th Edition - Clinical stage from 09/07/2013: Stage IA (T1a, N0, cM0) - Signed by [name], MD on 09/07/2013 - Pathologic: Stage IA (T1a, N0, cM0) - Signed by [name], MD on 09/07/2013    Breast cancer of upper-outer quadrant of left female breast ([place])  07/10/2007 Procedure   US biopsy showed invasive ductal carcinoma 0.8cm, Nottingham grade 3   07/30/2007 Surgery   She had left breast lumpectomy and LN biopsy which showed high grade invasive ductal cancer Nottingham grade 3, 2.7 cm, ER/PR/Her2 neu negative   11/24/2007 Surgery   Patient elected for bilateral mastectomy   09/07/2008 - 03/08/2009 Chemotherapy    dates are approximate: she received adriamycin and cytoxan followed by Taxol   03/08/2009 - 05/08/2009 Radiation Therapy   dates are approximate, she received XRT    07/09/2021 Imaging   CT CAP  IMPRESSION: 1. No evidence to suggest locally recurrent ianorectal neoplasm or metastatic disease in the chest, abdomen or pelvis. 2. Status post bilateral modified radical mastectomy and bilateral axillary lymph node dissection. 3. Three small supraumbilical ventral hernias containing only omental fat. No associated bowel incarceration or obstruction at this time. 4. Aortic atherosclerosis. 5. Additional incidental findings, as above.   Breast cancer of upper-outer quadrant of right female breast ([employer])  07/10/2006 Procedure   stereotactic biopsy showed atypical hyperplasia   10/23/2006 Surgery   right lumpectomy showed invasive ductal ca (Nottingham grade 1)  49m and DCIS, ER/PR positive Her 2 negative   10/09/2007 - 10/08/2012 Chemotherapy   Patient was placed on Tamoxifen   03/24/2017 Imaging   No acute findings. No evidence of recurrent carcinoma or metastatic disease   07/09/2021 Imaging   CT CAP  IMPRESSION: 1. No evidence to suggest locally recurrent ianorectal neoplasm or metastatic disease in the chest, abdomen or pelvis. 2. Status post bilateral modified radical mastectomy and bilateral axillary lymph node dissection. 3. Three small supraumbilical ventral hernias containing only omental fat. No associated bowel incarceration or obstruction at this time. 4. Aortic atherosclerosis. 5. Additional incidental findings, as above.   [place]  06/04/2019 Procedure   Colonoscopy per Dr. SAnson Fret Findings-the digital rectal exam revealed a 3 cm diameter firm rectal mass.  The mass was noncircumferential and located predominantly at the right bowel wall at the anorectal junction.  A nonobstructing mass was found at the anus and in the rectum    06/04/2019 Initial Biopsy    Follow pathology: Large intestine, rectum biopsy: Invasive well to moderately differentiated squamous cell carcinoma.  No rectal mucosa present.  There is strong diffuse expression of P 16 immunostain.  CDX 2, p63 and mCEA immunostains are also used in the diagnostic work-up of the case.   06/04/2019 Initial Diagnosis   Anal cancer ([place])   06/21/2019 PET scan   IMPRESSION: 1. Anorectal primary. No hypermetabolic metastatic disease within the chest, abdomen, or pelvis. Perirectal nodes, at least 1 of which is new since 02/26/2018 CT, suspicious based on size and interval development. 2. Mild limitations secondary to beam hardening artifact from bilateral hip arthroplasty. 3.  Aortic Atherosclerosis (ICD10-I70.0).   06/28/2019 Cancer Staging   Staging form: Anus, AJCC 8th Edition - Clinical stage from 06/28/2019: Stage IIA (cT2, cN0, cM0) - Signed by Alla Feeling, NP on 06/28/2019   06/28/2019 - 07/26/2019 Chemotherapy   concurrent chemoRT with Mitomycin and 5FU on week 1 and week 5 on starting 06/28/19. Last dose on 07/26/19   06/28/2019 - 08/09/2019 Radiation Therapy   concurrent chemoRT with Dr [name] 06/28/19-08/09/19   11/01/2019 PET scan   IMPRESSION: 1. Marked interval decrease in hypermetabolism noted at the level of the anal rectal primary. No evidence for hypermetabolic metastatic disease in the chest, abdomen, or pelvis. The perirectal lymph nodes identified previously have resolved in the interval. 2.  Aortic Atherosclerois (ICD10-170.0)   08/07/2020 Imaging   CT CAP  IMPRESSION: 1. No findings for residual/recurrent anal tumor, regional lymphadenopathy or metastatic disease. 2. Status post cholecystectomy. No biliary dilatation. 3. Small anterior abdominal wall hernia containing fat. 4. Aortic atherosclerosis.   Aortic Atherosclerosis (ICD10-I70.0).     07/09/2021 Imaging   CT CAP  IMPRESSION: 1. No evidence to suggest locally recurrent ianorectal neoplasm  or metastatic disease in the chest, abdomen or pelvis. 2. Status post bilateral modified radical mastectomy and bilateral axillary lymph node dissection. 3. Three small supraumbilical ventral hernias containing only omental fat. No associated bowel incarceration or obstruction at this time. 4. Aortic atherosclerosis. 5. Additional incidental findings, as above.   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 for evaluation and management of newly diagnosed T-cell non-Hodgkin's lymphoma.  Patient has a history of bilateral breast cancer status post mastectomies.  -2007: s/p right lumpectomy for ADH in 08/2006 showed DCIS ER/PR+, grade 1. 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 She was adopted, no known family history    She subsequently was diagnosed with anal squamous cell carcinoma cT2 N0 M0 diagnosed in September 2020.Workup showed a 3 cm mass at the anorectal junction, biopsy confirmed well to moderately differentiated squamous cell carcinoma. 06/21/19 PET scan showed no metastasis.  -S/p concurrent chemoRT with Mitomycin and 5FU on 08/09/19. Now on surveillance. -surveillance CT CAP 07/09/21 was NED. -surveillance colonoscopy done for rectal  bleeding on 12/07/21 showed only radiation changes. Her rectal bleeding has resolved.  Recently patient presented to her primary care physician on 05/23/2022 with her new right neck lump which has been  progressively enlarging.  She also subsequently developed a left neck swelling.  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  She has had a PET CT scan done on 06/19/2022 which showed hypermetabolic bilateral neck lymphadenopathy.  Also noted to have a new 1 cm focus of metabolic activity in segment 4 of the liver.  This is etiology indeterminate. Lumbar scoliosis and small supraumbilical hernia.  Patient has had Port-A-Cath placement done on 06/25/2022. Echocardiogram was done on 06/17/2022 and shows normal ejection fraction of 70 to 75% with grade 1 diastolic dysfunction.  Right ventricular systolic function within normal limits.  Patient's daughter was on the phone during this clinic visit.  Patient has had a surgical biopsy of the right cervical lymph node with Dr. Constance Holster today results of which are currently pending.  10 Point review of Systems was done is negative except as noted above.  MEDICAL HISTORY:  Past Medical History:  Diagnosis Date   Anal cancer ([place]) dx'd 05/2019   Anxiety    Arthritis    Bilateral breast cancer ([place]) 10/21/2013   Breast cancer ([place])    Cancer ([place])    Depression    Gallstones    GERD (gastroesophageal reflux disease)    doesn't take any meds for this   Headache    h/o migraines       History of bladder infections    History of hiatal hernia    History of migraine    Hyperlipidemia    takes Simvastatin daily   Hypertension    takes Hyzaar   Joint pain    Joint swelling    Lumbar stenosis    Neuropathy 09/07/2013   Pneumonia    Pre-diabetes     SURGICAL HISTORY: Past Surgical History:  Procedure Laterality Date   ABDOMINAL HYSTERECTOMY     bone spur removed from left foot     CHOLECYSTECTOMY     COLONOSCOPY     COLONOSCOPY WITH PROPOFOL Bilateral 12/07/2021   Procedure: COLONOSCOPY WITH PROPOFOL;  Surgeon: Clarene Essex, MD;   Location: [facility];  Service: Endoscopy;  Laterality: Bilateral;   double mastectomy      ESOPHAGOGASTRODUODENOSCOPY N/A 12/07/2021   Procedure: ESOPHAGOGASTRODUODENOSCOPY (EGD);  Surgeon: Clarene Essex, MD;  Location: [facility];  Service: Endoscopy;  Laterality: N/A;   HERNIA REPAIR     umbilical   IR IMAGING GUIDED PORT INSERTION  06/25/2022   right knee arthroscopy     right shoulder arthroscopy     surgery for hiatal hernia     TONSILLECTOMY     TOTAL HIP ARTHROPLASTY Left 02/12/2013   TOTAL HIP ARTHROPLASTY Left 02/12/2013   Procedure: LEFT TOTAL HIP ARTHROPLASTY;  Surgeon: Kerin Salen, MD;  Location: [place];  Service: Orthopedics;  Laterality: Left;   TOTAL HIP ARTHROPLASTY Right 05/03/2013   Procedure: TOTAL HIP ARTHROPLASTY;  Surgeon: Kerin Salen, MD;  Location: [place];  Service: Orthopedics;  Laterality: Right;   TOTAL KNEE ARTHROPLASTY Right 02/27/2015   Procedure: TOTAL KNEE ARTHROPLASTY;  Surgeon: Frederik Pear, MD;  Location: [place];  Service: Orthopedics;  Laterality: Right;   TOTAL SHOULDER ARTHROPLASTY Right 09/12/2016   Procedure: RIGHT TOTAL SHOULDER ARTHROPLASTY;  Surgeon: Tania Ade, MD;  Location:  [place] [facility];  Service: Orthopedics;  Laterality: Right;  RIGHT TOTAL SHOULDER ARTHROPLASTY    I have reviewed the social history and family history with the patient and they are unchanged from previous note.  ALLERGIES:  is allergic to oxycodone-acetaminophen, tramadol, codeine, and vicodin [hydrocodone-acetaminophen].  MEDICATIONS:  Current Outpatient Medications  Medication Sig Dispense Refill   HYDROcodone-acetaminophen (NORCO) 5-325 MG tablet Take 1 tablet by mouth every 6 (six) hours as needed for moderate pain. 30 tablet 0   loratadine (CLARITIN) 10 MG tablet Take 1 tablet (10 mg total) by mouth daily. 30 tablet 0   predniSONE (DELTASONE) 20 MG tablet Take 3 tablets (60 mg total) by mouth daily with breakfast for 5 days. 15 tablet 0   senna-docusate (SENNA S) 8.6-50  MG tablet Take 2 tablets by mouth at bedtime. 60 tablet 1   acyclovir (ZOVIRAX) 400 MG tablet Take 1 tablet (400 mg total) by mouth daily. 30 tablet 3   aspirin-acetaminophen-caffeine (EXCEDRIN MIGRAINE) 250-250-65 MG tablet Take 2 tablets by mouth every 6 (six) hours as needed for headache.     lidocaine-prilocaine (EMLA) cream Apply to affected area once 30 g 3   LINZESS 72 MCG capsule Take 72 mcg by mouth daily as needed (constipation).     losartan (COZAAR) 100 MG tablet Take 100 mg by mouth daily.  6   meclizine (ANTIVERT) 12.5 MG tablet Take 1 tablet (12.5 mg total) by mouth 3 (three) times daily as needed for dizziness. 30 tablet 0   Menthol-Methyl Salicylate (SALONPAS PAIN RELIEF PATCH EX) Apply 1 patch topically daily as needed (pain).     ondansetron (ZOFRAN) 8 MG tablet Take 1 tablet (8 mg total) by mouth every 8 (eight) hours as needed for nausea or vomiting. Start on the third day after cyclophosphamide chemotherapy. 30 tablet 1   predniSONE (DELTASONE) 20 MG tablet Take 3 tablets (60 mg total) by mouth daily. Take for 2 days starting the day after chemotherapy on day 4 30 tablet 0   prochlorperazine (COMPAZINE) 10 MG tablet Take 1 tablet (10 mg total) by mouth every 6 (six) hours as needed for nausea or vomiting. 30 tablet 6   rosuvastatin (CRESTOR) 40 MG tablet Take 40 mg by mouth daily.     No current facility-administered medications for this visit.    PHYSICAL EXAMINATION: ECOG PERFORMANCE STATUS: 1 - Symptomatic but completely ambulatory  Vitals:   06/21/22 1405  BP: (!) 158/100  Pulse: 78  Resp: 16  Temp: 98.3 F (36.8 C)  SpO2: 91%   Wt Readings from Last 3 Encounters:  06/25/22 218 lb 4.1 oz (99 kg)  06/21/22 218 lb 14.4 oz (99.3 kg)  06/13/22 216 lb 1.6 oz (98 kg)    NAD GENERAL:alert, in no acute distress and comfortable SKIN: no acute rashes, no significant lesions EYES: conjunctiva are pink and non-injected, sclera anicteric NECK: supple, no JVD LYMPH:  Palpable lymphadenopathy bilateral neck LUNGS: clear to auscultation b/l with normal respiratory effort HEART: regular rate & rhythm ABDOMEN:  normoactive bowel sounds , non tender, not distended. Extremity: no pedal edema PSYCH: alert & oriented x 3 with fluent speech NEURO: no focal motor/sensory deficits  LABORATORY DATA:  I have reviewed the data as listed    Latest Ref Rng & Units 06/13/2022    1:18 PM 06/10/2022   11:30 AM 04/07/2022    9:28 AM  CBC  WBC 4.0 - 10.5 K/uL 3.6  3.7  5.1   Hemoglobin 12.0 - 15.0 g/dL  13.3  13.4  13.3   Hematocrit 36.0 - 46.0 % 40.3  42.5  42.2   Platelets 150 - 400 K/uL 198  203  261         Latest Ref Rng & Units 06/13/2022    1:18 PM 06/10/2022   11:30 AM 06/04/2022    5:32 PM  CMP  Glucose 70 - 99 mg/dL 97  93    BUN 8 - 23 mg/dL 13  14    Creatinine 0.44 - 1.00 mg/dL 1.02  0.76  1.10   Sodium 135 - 145 mmol/L 143  141    Potassium 3.5 - 5.1 mmol/L 3.9  3.2    Chloride 98 - 111 mmol/L 106  109    CO2 22 - 32 mmol/L 30  24    Calcium 8.9 - 10.3 mg/dL 9.2  9.5    Total Protein 6.5 - 8.1 g/dL 7.1     Total Bilirubin 0.3 - 1.2 mg/dL 0.4     Alkaline Phos 38 - 126 U/L 91     AST 15 - 41 U/L 16     ALT 0 - 44 U/L 11         RADIOGRAPHIC STUDIES: .IR IMAGING GUIDED PORT INSERTION  Result Date: 06/25/2022 CLINICAL DATA:  T-cell lymphoma and need for porta cath for chemotherapy. EXAM: IMPLANTED PORT A CATH PLACEMENT WITH ULTRASOUND AND FLUOROSCOPIC GUIDANCE ANESTHESIA/SEDATION: Moderate (conscious) sedation was employed during this procedure. A total of Versed 2.0 mg and Fentanyl 100 mcg was administered intravenously by radiology nursing. Moderate Sedation Time: 37 minutes. The patient's level of consciousness and vital signs were monitored continuously by radiology nursing throughout the procedure under my direct supervision. FLUOROSCOPY: 30 seconds.  3.0 mGy. PROCEDURE: The procedure, risks, benefits, and alternatives were explained to the  patient. Questions regarding the procedure were encouraged and answered. The patient understands and consents to the procedure. A time-out was performed prior to initiating the procedure. Ultrasound was utilized to confirm patency of the right internal jugular vein. A permanent ultrasound image was recorded. The right neck and chest were prepped with chlorhexidine in a sterile fashion, and a sterile drape was applied covering the operative field. Maximum barrier sterile technique with sterile gowns and gloves were used for the procedure. Local anesthesia was provided with 1% lidocaine. After creating a small venotomy incision, a 21 gauge needle was advanced into the right internal jugular vein under direct, real-time ultrasound guidance. Ultrasound image documentation was performed. After securing guidewire access, an 8 Fr dilator was placed. A J-wire was kinked to measure appropriate catheter length. A subcutaneous port pocket was then created along the upper chest wall utilizing sharp and blunt dissection. Portable cautery was utilized. The pocket was irrigated with sterile saline. A single lumen power injectable port was chosen for placement. The 8 Fr catheter was tunneled from the port pocket site to the venotomy incision. The port was placed in the pocket. External catheter was trimmed to appropriate length based on guidewire measurement. At the venotomy, an 8 Fr peel-away sheath was placed over a guidewire. The catheter was then placed through the sheath and the sheath removed. Final catheter positioning was confirmed and documented with a fluoroscopic spot image. The port was accessed with a needle and aspirated and flushed with heparinized saline. The access needle was removed. The venotomy and port pocket incisions were closed with subcutaneous 3-0 Monocryl and subcuticular 4-0 Vicryl. Dermabond was applied to both incisions. COMPLICATIONS: COMPLICATIONS None FINDINGS: After  catheter placement, the tip lies  at the cavo-atrial junction. The catheter aspirates normally and is ready for immediate use. IMPRESSION: Placement of single lumen port a cath via right internal jugular vein. The catheter tip lies at the cavo-atrial junction. A power injectable port a cath was placed and is ready for immediate use. Electronically Signed   By: Aletta Edouard M.D.   On: 06/25/2022 17:22   NM PET Image Restag (PS) Skull Base To Thigh  Result Date: 06/19/2022 CLINICAL DATA:  Initial treatment strategy for T-cell lymphoma. Also history of anal cancer and remote history of breast cancer EXAM: NUCLEAR MEDICINE PET SKULL BASE TO THIGH TECHNIQUE: 10.7 mCi F-18 FDG was injected intravenously. Full-ring PET imaging was performed from the skull base to thigh after the radiotracer. CT data was obtained and used for attenuation correction and anatomic localization. Fasting blood glucose: 97 mg/dl COMPARISON:  Multiple exams, including CT neck 06/04/2022; CT scan 07/09/2021; PET-CT 11/01/2019 FINDINGS: Mediastinal blood pool activity: SUV max 2.6 Liver activity: SUV max 4.0 NECK: Hypermetabolic right infra-auricular lymph node along with hypermetabolic right mandibular node, right submandibular lymph node, right level IIa adenopathy, bilateral level III and IV adenopathy as well as a right V lymph node. Index right infra-auricular node measures 1.8 cm in short axis with maximum SUV 14.4, Deauville 5. This previously measured 1.8 cm in short axis on 06/04/2022 and was not present on the prior PET-CT. Index left level III lymph node measures 0.9 cm in short axis on image 42 series 4 with maximum SUV 8.4, [place] 5. Index right level IV lymph node on image 45 series 4 measures 1.3 cm in short axis with maximum SUV 11.2, [place] 5. Incidental CT findings: Small mucous retention cyst of the left maxillary sinus. CHEST: No significant abnormal hypermetabolic activity in this region. Incidental CT findings: Bilateral mastectomies. Thoracic  aortic atherosclerosis. ABDOMEN/PELVIS: Small focus of hypermetabolic activity in segment 4 of the liver just above the level of the gallbladder fossa, maximum SUV 5.4 which is mildly above background liver activity (Deauville 4). This focus measures only about 1 cm in diameter. An early metastatic focus is not excluded. Incidental CT findings: Cholecystectomy. Pelvis partially obscured on the CT dated due to streak artifact from bilateral hip implants. Small supraumbilical hernias contain adipose tissue. SKELETON: Accentuated activity around the right shoulder prosthesis, considered physiologic. There is accentuated activity at the severe degenerative findings of the left glenohumeral joint, considered benign. Incidental CT findings: Bilateral hip prostheses. Dextroconvex lumbar scoliosis with rotary component. IMPRESSION: 1. Hypermetabolic bilateral neck adenopathy, Deauville 5, compatible with active malignancy. 2. New approximately 1 cm focus of mildly accentuated metabolic activity in segment 4 of the liver, [place] 4. Early metastatic focus is a distinct possibility. Hepatic protocol MRI with and without contrast could be utilized to confirm the presence of a corresponding lesion, which would seemingly be more likely to be related to the patient's anal carcinoma rather than lymphoma based on location. 3. Other imaging findings of potential clinical significance: Mild chronic left maxillary sinusitis. Aortic Atherosclerosis (ICD10-I70.0). Lumbar scoliosis. Small supraumbilical hernias contain adipose tissue. Electronically Signed   By: Van Clines M.D.   On: 06/19/2022 11:40   ECHOCARDIOGRAM COMPLETE  Result Date: 06/17/2022    ECHOCARDIOGRAM REPORT   Patient Name:   Cynthia Velasquez Date of Exam: 06/17/2022 Medical Rec #:  [id number]     Height:       65.0 in Accession #:    [id number]    Weight:  216.1 lb Date of Birth:  07-Jul-1951    BSA:          2.044 m Patient Age:    71 years      BP:            151/88 mmHg Patient Gender: F             HR:           65 bpm. Exam Location:  Outpatient Procedure: 2D Echo, 3D Echo, Color Doppler, Cardiac Doppler and Strain Analysis Indications:    Chemo Z09  History:        Patient has prior history of Echocardiogram examinations, most                 recent 11/24/2019. Risk Factors:Hypertension and Dyslipidemia.                 Past history of breast cancer.  Sonographer:    [name] RDCS Referring Phys: [id number] [facility]  Sonographer Comments: Suboptimal parasternal window. IMPRESSIONS  1. Left ventricular ejection fraction, by estimation, is 70 to 75%. The left ventricle has hyperdynamic function. The left ventricle has no regional wall motion abnormalities. Left ventricular diastolic parameters are consistent with Grade I diastolic dysfunction (impaired relaxation). The average left ventricular global longitudinal strain is -21.7 %. The global longitudinal strain is normal.  2. Right ventricular systolic function is normal. The right ventricular size is normal. Tricuspid regurgitation signal is inadequate for assessing PA pressure.  3. The mitral valve is grossly normal. No evidence of mitral valve regurgitation.  4. The aortic valve is tricuspid. Aortic valve regurgitation is not visualized.  5. The inferior vena cava is normal in size with greater than 50% respiratory variability, suggesting right atrial pressure of 3 mmHg. Comparison(s): Changes from prior study are noted. 11/24/2019: LVEF 60-65%, GLS 23.2%. FINDINGS  Left Ventricle: Left ventricular ejection fraction, by estimation, is 70 to 75%. The left ventricle has hyperdynamic function. The left ventricle has no regional wall motion abnormalities. The average left ventricular global longitudinal strain is -21.7  %. The global longitudinal strain is normal. The left ventricular internal cavity size was normal in size. There is no left ventricular hypertrophy. Left ventricular diastolic parameters are  consistent with Grade I diastolic dysfunction (impaired relaxation). Indeterminate filling pressures. Right Ventricle: The right ventricular size is normal. No increase in right ventricular wall thickness. Right ventricular systolic function is normal. Tricuspid regurgitation signal is inadequate for assessing PA pressure. Left Atrium: Left atrial size was normal in size. Right Atrium: Right atrial size was normal in size. Pericardium: There is no evidence of pericardial effusion. Mitral Valve: The mitral valve is grossly normal. No evidence of mitral valve regurgitation. Tricuspid Valve: The tricuspid valve is grossly normal. Tricuspid valve regurgitation is not demonstrated. Aortic Valve: The aortic valve is tricuspid. Aortic valve regurgitation is not visualized. Pulmonic Valve: The pulmonic valve was normal in structure. Pulmonic valve regurgitation is not visualized. Aorta: The aortic root and ascending aorta are structurally normal, with no evidence of dilitation. Venous: The inferior vena cava is normal in size with greater than 50% respiratory variability, suggesting right atrial pressure of 3 mmHg. IAS/Shunts: No atrial level shunt detected by color flow Doppler.  LEFT VENTRICLE PLAX 2D LVIDd:         3.50 cm   Diastology LVIDs:         2.10 cm   LV e' medial:    4.82 cm/s LV PW:  0.90 cm   LV E/e' medial:  10.4 LV IVS:        1.10 cm   LV e' lateral:   5.34 cm/s LVOT diam:     1.90 cm   LV E/e' lateral: 9.4 LV SV:         58 LV SV Index:   28        2D Longitudinal Strain LVOT Area:     2.84 cm  2D Strain GLS (A2C):   -21.4 %                          2D Strain GLS (A3C):   -20.2 %                          2D Strain GLS (A4C):   -23.6 %                          2D Strain GLS Avg:     -21.7 % RIGHT VENTRICLE RV S prime:     12.70 cm/s TAPSE (M-mode): 1.5 cm LEFT ATRIUM             Index        RIGHT ATRIUM          Index LA diam:        2.80 cm 1.37 cm/m   RA Area:     9.69 cm LA Vol (A2C):   32.5  ml 15.90 ml/m  RA Volume:   16.70 ml 8.17 ml/m LA Vol (A4C):   31.9 ml 15.60 ml/m LA Biplane Vol: 32.3 ml 15.80 ml/m  AORTIC VALVE LVOT Vmax:   104.00 cm/s LVOT Vmean:  69.800 cm/s LVOT VTI:    0.205 m  AORTA Ao Root diam: 3.30 cm Ao Asc diam:  3.40 cm MITRAL VALVE MV Area (PHT): 2.21 cm    SHUNTS MV Decel Time: 344 msec    Systemic VTI:  0.20 m MV E velocity: 50.10 cm/s  Systemic Diam: 1.90 cm MV A velocity: 82.70 cm/s MV E/A ratio:  0.61 Lyman Bishop MD Electronically signed by Lyman Bishop MD Signature Date/Time: 06/17/2022/3:45:06 PM    Final    [name] CORE BIOPSY (LYMPH NODES)  Result Date: 06/10/2022 INDICATION: Nonpainful right neck mass.  No history of primary malignancy EXAM: ULTRASOUND GUIDED CORE BIOPSY OF RIGHT NECK MASS MEDICATIONS: Lidocaine 1% subcutaneous ANESTHESIA/SEDATION: Intravenous Fentanyl 141mg and Versed 236mwere administered as conscious sedation during continuous monitoring of the patient's level of consciousness and physiological / cardiorespiratory status by the radiology RN, with a total moderate sedation time of 12 minutes. PROCEDURE: The procedure, risks, benefits, and alternatives were explained to the patient. Questions regarding the procedure were encouraged and answered. The patient understands and consents to the procedure. Survey ultrasound of the right neck performed. Hypoechoic adenopathy was localized. An appropriate skin site was determined. The operative field was prepped with chlorhexidine in a sterile fashion, and a sterile drape was applied covering the operative field. A sterile gown and sterile gloves were used for the procedure. Local anesthesia was provided with 1% Lidocaine. Under real-time ultrasound guidance, multiple 18 gauge core biopsy samples were obtained with a not of a needle, submitted to surgical pathology. COMPLICATIONS: None immediate. FINDINGS: Hypoechoic right cervical adenopathy localized. Representative core biopsy samples obtained as above.  IMPRESSION: 1. Technically successful ultrasound-guided core biopsy, right neck mass. Electronically Signed  By: Lucrezia Europe M.D.   On: 06/10/2022 14:41   CT Soft Tissue Neck W Contrast  Result Date: 06/06/2022 CLINICAL DATA:  Right neck mass non pulsatile EXAM: CT NECK WITH CONTRAST TECHNIQUE: Multidetector CT imaging of the neck was performed using the standard protocol following the bolus administration of intravenous contrast. RADIATION DOSE REDUCTION: This exam was performed according to the departmental dose-optimization program which includes automated exposure control, adjustment of the mA and/or kV according to patient size and/or use of iterative reconstruction technique. CONTRAST:  42m OMNIPAQUE IOHEXOL 300 MG/ML  SOLN COMPARISON:  None Available. FINDINGS: Pharynx and larynx: Normal. No mass or swelling. Salivary glands: No inflammation, mass, or stone. Thyroid: 11 mm right thyroid nodule. No further imaging necessary (ref: J Am Coll Radiol. 2015 Feb;12(2): 143-50). Lymph nodes: Enlarged lymph nodes right neck. Lymph node just below the parotid gland measures 21 x 25 mm. Below this and superficial to the sternocleidomastoid muscle is a lymph node measuring 12.5 mm. Right level 2 lymph node 10 mm. Small right supraclavicular lymph nodes measuring 10 mm. 10 mm lymph node posterior to the left sternocleidomastoid muscle below the hyoid bone. Vascular: Normal vascular enhancement Limited intracranial: Negative Visualized orbits: Negative Mastoids and visualized paranasal sinuses: Mild mucosal edema left maxillary sinus. Remaining sinuses clear Skeleton: Cervical spondylosis.  No acute abnormality. Upper chest: Lung apices clear bilaterally Other: None IMPRESSION: Multiple enlarged lymph nodes in the right neck. Recommend tissue sampling to rule out metastatic disease or lymphoma. No pharyngeal mass. Electronically Signed   By: CFranchot GalloM.D.   On: 06/06/2022 15:38     ASSESSMENT & PLAN:  TGladyce Mcrayis a 71y.o. female with   1.  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 Plan -PET CT scan results were discussed in detail with the patient.  She mainly has bilateral cervical hypermetabolic lymphadenopathy which would make at this stage II disease.  However she does have an indeterminate 1 cm lesion in the liver.  If this were related to the lymphoma this would make it stage IV disease. 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 -She had a surgical biopsy with Dr. RConstance Holsterfrom ENT today and the additional tissue sample will hopefully help subtype her T-cell non-Hodgkin's lymphoma and will also more definitely confirm CD30 status with more tissue. -We discussed the challenges of being able to use full dose anthracyclines since she has already had about 240 mg per metered squared of Adriamycin with her adjuvant breast cancer chemotherapy. -She also has not had significant previous exposure to chemotherapy including AC followed by T for breast cancer and 5-FU Mitomycin-C for anal cancer and might have some issues with cytopenias with treatment. -Currently her blood counts are normal and stable. -We shall call her midweek next week with results of her open biopsy and decide on definitive treatment plan. Echocardiogram is within normal limits at this time -We will plan to reevaluate liver lesion after 2 cycles of treatment for her T-cell non-Hodgkin's lymphoma and if still persistent liver lesion will need to consider hepatic MRI. -Patient has already had a port a Placement  -All of the patient's and her daughter's questions were answered in detail based on available information at this time.   2. Anal Squamous Cell Carcinoma, cT2N0M0-currently in remission.  Details as noted above   3. H/o bilateral breast cancer, s/p  mastectomies  -2007: s/p right  lumpectomy for ADH in 08/2006 showed DCIS ER/PR+, grade 1. 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 She was adopted, no known family history     FOLLOW UP: We will set up a phone visit to discuss final pathology results and treatment plan  Orders Placed This Encounter  Procedures   Lactate dehydrogenase (LDH)    Standing Status:   Future    Standing Expiration Date:   07/02/2023   CBC with Differential ([place] Only)    Standing Status:   Future    Standing Expiration Date:   07/03/2023   CMP ([place] only)    Standing Status:   Future    Standing Expiration Date:   07/03/2023   The total time spent in the appointment was 60 minutes*.  All of the patient's questions were answered with apparent satisfaction. The patient knows to call the clinic with any problems, questions or concerns.   Sullivan Lone MD MS AAHIVMS [facility] Hematology/Oncology Physician [facility]  .*Total Encounter Time as defined by the Centers for Medicare and Medicaid Services includes, in addition to the face-to-face time of a patient visit (documented in the note above) non-face-to-face time: obtaining and reviewing outside history, ordering and reviewing medications, tests or procedures, care coordination (communications with other health care professionals or caregivers) and documentation in the medical record.

## 2022-07-01 DIAGNOSIS — M5416 Radiculopathy, lumbar region: Secondary | ICD-10-CM | POA: Diagnosis not present

## 2022-07-01 DIAGNOSIS — M47896 Other spondylosis, lumbar region: Secondary | ICD-10-CM | POA: Diagnosis not present

## 2022-07-03 ENCOUNTER — Encounter: Payer: Self-pay | Admitting: Hematology

## 2022-07-03 ENCOUNTER — Telehealth: Payer: Self-pay | Admitting: Hematology

## 2022-07-03 NOTE — Telephone Encounter (Signed)
Left message with follow-up appointments per appointment request workqueue. 

## 2022-07-03 NOTE — Progress Notes (Signed)
[place]   [phone] Fax:([phone]) [place] PHONE VISIT  Patient Care Team: Brunetta Genera, MD as PCP - Hematology/Oncology (Hematology) Wonda Horner, MD as Consulting Physician (Gastroenterology) Truitt Merle, MD as Consulting Physician (Hematology) Alla Feeling, NP as Nurse Practitioner (Nurse Practitioner) Kyung Rudd, MD as Consulting Physician (Radiation Oncology) Michael Boston, MD as Consulting Physician (General Surgery) Izora Gala, MD as Attending Physician (Otolaryngology)  Date of Service:  .06/27/2022  CHIEF COMPLAINT: f/u of newly diagnosed T cell lymphoma  SUMMARY OF ONCOLOGIC HISTORY: Oncology History Overview Note  Cancer Staging Anal cancer [facility]) Staging form: Anus, AJCC 8th Edition - Clinical stage from 06/28/2019: Stage IIA (cT2, cN0, cM0) - Signed by Alla Feeling, NP on 06/28/2019  Breast cancer of upper-outer quadrant of left female breast [facility]) Staging form: Breast, AJCC 7th Edition - Clinical stage from 09/07/2013: Stage IIA (T2, N0, cM0) - Signed by [name], MD on 09/07/2013 - Pathologic: Stage IIA (T2, N0, cM0) - Signed by [name], MD on 09/07/2013  Breast cancer of upper-outer quadrant of right female breast [facility]) Staging form: Breast, AJCC 7th Edition - Clinical stage from 09/07/2013: Stage IA (T1a, N0, cM0) - Signed by [name], MD on 09/07/2013 - Pathologic: Stage IA (T1a, N0, cM0) - Signed by [name], MD on 09/07/2013    Breast cancer of upper-outer quadrant of left female breast ([place])  07/10/2007 Procedure   US biopsy showed invasive ductal carcinoma 0.8cm, Nottingham grade 3   07/30/2007 Surgery   She had left breast lumpectomy and LN biopsy which showed high grade invasive ductal cancer Nottingham grade 3, 2.7 cm, ER/PR/Her2 neu negative   11/24/2007 Surgery   Patient elected for bilateral mastectomy   09/07/2008 - 03/08/2009 Chemotherapy   dates are approximate:  she received adriamycin and cytoxan followed by Taxol   03/08/2009 - 05/08/2009 Radiation Therapy   dates are approximate, she received XRT    07/09/2021 Imaging   CT CAP  IMPRESSION: 1. No evidence to suggest locally recurrent ianorectal neoplasm or metastatic disease in the chest, abdomen or pelvis. 2. Status post bilateral modified radical mastectomy and bilateral axillary lymph node dissection. 3. Three small supraumbilical ventral hernias containing only omental fat. No associated bowel incarceration or obstruction at this time. 4. Aortic atherosclerosis. 5. Additional incidental findings, as above.   Breast cancer of upper-outer quadrant of right female breast ([place])  07/10/2006 Procedure   stereotactic biopsy showed atypical hyperplasia   10/23/2006 Surgery   right lumpectomy showed invasive ductal ca (Nottingham grade 1)  64m and DCIS, ER/PR positive Her 2 negative   10/09/2007 - 10/08/2012 Chemotherapy   Patient was placed on Tamoxifen   03/24/2017 Imaging   No acute findings. No evidence of recurrent carcinoma or metastatic disease   07/09/2021 Imaging   CT CAP  IMPRESSION: 1. No evidence to suggest locally recurrent ianorectal neoplasm or metastatic disease in the chest, abdomen or pelvis. 2. Status post bilateral modified radical mastectomy and bilateral axillary lymph node dissection. 3. Three small supraumbilical ventral hernias containing only omental fat. No associated bowel incarceration or obstruction at this time. 4. Aortic atherosclerosis. 5. Additional incidental findings, as above.   [place]  06/04/2019 Procedure   Colonoscopy per Dr. SAnson Fret Findings-the digital rectal exam revealed a 3 cm diameter firm rectal mass.  The mass was noncircumferential and located predominantly at the right bowel wall at the anorectal junction. A nonobstructing mass was found at  the anus and in the rectum    06/04/2019 Initial Biopsy   Follow  pathology: Large intestine, rectum biopsy: Invasive well to moderately differentiated squamous cell carcinoma.  No rectal mucosa present.  There is strong diffuse expression of P 16 immunostain.  CDX 2, p63 and mCEA immunostains are also used in the diagnostic work-up of the case.   06/04/2019 Initial Diagnosis   Anal cancer ([place])   06/21/2019 PET scan   IMPRESSION: 1. Anorectal primary. No hypermetabolic metastatic disease within the chest, abdomen, or pelvis. Perirectal nodes, at least 1 of which is new since 02/26/2018 CT, suspicious based on size and interval development. 2. Mild limitations secondary to beam hardening artifact from bilateral hip arthroplasty. 3.  Aortic Atherosclerosis (ICD10-I70.0).   06/28/2019 Cancer Staging   Staging form: Anus, AJCC 8th Edition - Clinical stage from 06/28/2019: Stage IIA (cT2, cN0, cM0) - Signed by Alla Feeling, NP on 06/28/2019   06/28/2019 - 07/26/2019 Chemotherapy   concurrent chemoRT with Mitomycin and 5FU on week 1 and week 5 on starting 06/28/19. Last dose on 07/26/19   06/28/2019 - 08/09/2019 Radiation Therapy   concurrent chemoRT with Dr [name] 06/28/19-08/09/19   11/01/2019 PET scan   IMPRESSION: 1. Marked interval decrease in hypermetabolism noted at the level of the anal rectal primary. No evidence for hypermetabolic metastatic disease in the chest, abdomen, or pelvis. The perirectal lymph nodes identified previously have resolved in the interval. 2.  Aortic Atherosclerois (ICD10-170.0)   08/07/2020 Imaging   CT CAP  IMPRESSION: 1. No findings for residual/recurrent anal tumor, regional lymphadenopathy or metastatic disease. 2. Status post cholecystectomy. No biliary dilatation. 3. Small anterior abdominal wall hernia containing fat. 4. Aortic atherosclerosis.   Aortic Atherosclerosis (ICD10-I70.0).     07/09/2021 Imaging   CT CAP  IMPRESSION: 1. No evidence to suggest locally recurrent ianorectal neoplasm  or metastatic disease in the chest, abdomen or pelvis. 2. Status post bilateral modified radical mastectomy and bilateral axillary lymph node dissection. 3. Three small supraumbilical ventral hernias containing only omental fat. No associated bowel incarceration or obstruction at this time. 4. Aortic atherosclerosis. 5. Additional incidental findings, as above.   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 for evaluation and management of newly diagnosed T-cell non-Hodgkin's lymphoma.  Patient has a history of bilateral breast cancer status post mastectomies.  -2007: s/p right lumpectomy for ADH in 08/2006 showed DCIS ER/PR+, grade 1. 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 She was adopted, no known family history    She subsequently was diagnosed with anal squamous cell carcinoma cT2 N0 M0 diagnosed in September 2020.Workup showed a 3 cm mass at the anorectal junction, biopsy confirmed well to moderately differentiated squamous cell carcinoma. 06/21/19 PET scan showed no metastasis.  -S/p concurrent chemoRT with Mitomycin and 5FU on 08/09/19. Now on surveillance. -surveillance CT CAP 07/09/21 was NED. -surveillance colonoscopy done for rectal bleeding on 12/07/21 showed only radiation  changes. Her rectal bleeding has resolved.  Recently patient presented to her primary care physician on 05/23/2022 with her new right neck lump which has been  progressively enlarging.  She also subsequently developed a left neck swelling.  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  She has had a PET CT scan done on 06/19/2022 which showed hypermetabolic bilateral neck lymphadenopathy.  Also noted to have a new 1 cm focus of metabolic activity in segment 4 of the liver.  This is etiology indeterminate. Lumbar scoliosis and small supraumbilical hernia.  Patient has had Port-A-Cath placement done on 06/25/2022. Echocardiogram was done on 06/17/2022 and shows normal ejection fraction of 70 to 75% with grade 1 diastolic dysfunction.  Right ventricular systolic function within normal limits.  INTERVAL HISTORY  ..I connected with Cynthia Velasquez on 06/27/2022 at  2:00 PM EDT by telephone visit and verified that I am speaking with the correct person using two identifiers.   I discussed the limitations, risks, security and privacy concerns of performing an evaluation and management service by telemedicine and the availability of in-person appointments. I also discussed with the patient that there may be a patient responsible charge related to this service. The patient expressed understanding and agreed to proceed.   Other persons participating in the visit and their role in the encounter: None Impression Patient's location: Home Provider's [place]  Chief Complaint: Discussion of biopsy results showing peripheral T-cell lymphoma NOS and discussion of treatment plan.  Patient notes tightness in the neck bilaterally and persistent significant neck swelling.  No new shortness of breath or difficulty swallowing.  We discussed the surgical pathology results from her excisional lymph node biopsy by Dr. Constance Holster which confirmed peripheral T-cell lymphoma NOS. CD30 negative.  We discussed that in the context of her previous  significant anthracycline use with adjuvant treatment for her breast cancer we would recommend going with CEOP regimen and consider adding dose reduced anthracyclines with possibly using Zinecard if needed based on response after 2-3 cycles of treatment. Patient had several questions which were answered in details. She is keen to proceed with treatment soon. We did send a prescription in for prednisone to try to shrink her lymph nodes pending initiation of chemotherapy.  ROS 10 Point review of Systems was done is negative except as noted above.  MEDICAL HISTORY:  Past Medical History:  Diagnosis Date   Anal cancer ([place]) dx'd 05/2019   Anxiety    Arthritis    Bilateral breast cancer ([place]) 10/21/2013   Breast cancer ([place])    Cancer ([place])    Depression    Gallstones    GERD (gastroesophageal reflux disease)    doesn't take any meds for this   Headache    h/o migraines       History of bladder infections    History of hiatal hernia    History of migraine    Hyperlipidemia    takes Simvastatin daily   Hypertension    takes Hyzaar   Joint pain    Joint swelling    Lumbar stenosis    Neuropathy 09/07/2013   Pneumonia    Pre-diabetes     SURGICAL HISTORY: Past Surgical History:  Procedure Laterality Date   ABDOMINAL HYSTERECTOMY     bone spur removed from left foot     CHOLECYSTECTOMY     COLONOSCOPY     COLONOSCOPY WITH PROPOFOL  Bilateral 12/07/2021   Procedure: COLONOSCOPY WITH PROPOFOL;  Surgeon: Clarene Essex, MD;  Location: [facility];  Service: Endoscopy;  Laterality: Bilateral;   double mastectomy      ESOPHAGOGASTRODUODENOSCOPY N/A 12/07/2021   Procedure: ESOPHAGOGASTRODUODENOSCOPY (EGD);  Surgeon: Clarene Essex, MD;  Location: [facility];  Service: Endoscopy;  Laterality: N/A;   HERNIA REPAIR     umbilical   IR IMAGING GUIDED PORT INSERTION  06/25/2022   right knee arthroscopy     right shoulder arthroscopy     surgery for hiatal hernia     TONSILLECTOMY     TOTAL  HIP ARTHROPLASTY Left 02/12/2013   TOTAL HIP ARTHROPLASTY Left 02/12/2013   Procedure: LEFT TOTAL HIP ARTHROPLASTY;  Surgeon: Kerin Salen, MD;  Location: [place];  Service: Orthopedics;  Laterality: Left;   TOTAL HIP ARTHROPLASTY Right 05/03/2013   Procedure: TOTAL HIP ARTHROPLASTY;  Surgeon: Kerin Salen, MD;  Location: [place];  Service: Orthopedics;  Laterality: Right;   TOTAL KNEE ARTHROPLASTY Right 02/27/2015   Procedure: TOTAL KNEE ARTHROPLASTY;  Surgeon: Frederik Pear, MD;  Location: [place];  Service: Orthopedics;  Laterality: Right;   TOTAL SHOULDER ARTHROPLASTY Right 09/12/2016   Procedure: RIGHT TOTAL SHOULDER ARTHROPLASTY;  Surgeon: Tania Ade, MD;  Location: [place];  Service: Orthopedics;  Laterality: Right;  RIGHT TOTAL SHOULDER ARTHROPLASTY    I have reviewed the social history and family history with the patient and they are unchanged from previous note.  ALLERGIES:  is allergic to oxycodone-acetaminophen, tramadol, codeine, and vicodin [hydrocodone-acetaminophen].  MEDICATIONS:  Current Outpatient Medications  Medication Sig Dispense Refill   acyclovir (ZOVIRAX) 400 MG tablet Take 1 tablet (400 mg total) by mouth daily. 30 tablet 3   aspirin-acetaminophen-caffeine (EXCEDRIN MIGRAINE) 812-751-70 MG tablet Take 2 tablets by mouth every 6 (six) hours as needed for headache.     HYDROcodone-acetaminophen (NORCO) 5-325 MG tablet Take 1 tablet by mouth every 6 (six) hours as needed for moderate pain. 30 tablet 0   lidocaine-prilocaine (EMLA) cream Apply to affected area once 30 g 3   LINZESS 72 MCG capsule Take 72 mcg by mouth daily as needed (constipation).     loratadine (CLARITIN) 10 MG tablet Take 1 tablet (10 mg total) by mouth daily. 30 tablet 0   losartan (COZAAR) 100 MG tablet Take 100 mg by mouth daily.  6   meclizine (ANTIVERT) 12.5 MG tablet Take 1 tablet (12.5 mg total) by mouth 3 (three) times daily as needed for dizziness. 30 tablet 0   Menthol-Methyl Salicylate  (SALONPAS PAIN RELIEF PATCH EX) Apply 1 patch topically daily as needed (pain).     ondansetron (ZOFRAN) 8 MG tablet Take 1 tablet (8 mg total) by mouth every 8 (eight) hours as needed for nausea or vomiting. Start on the third day after cyclophosphamide chemotherapy. 30 tablet 1   predniSONE (DELTASONE) 20 MG tablet Take 3 tablets (60 mg total) by mouth daily. Take for 2 days starting the day after chemotherapy on day 4 30 tablet 0   prochlorperazine (COMPAZINE) 10 MG tablet Take 1 tablet (10 mg total) by mouth every 6 (six) hours as needed for nausea or vomiting. 30 tablet 6   rosuvastatin (CRESTOR) 40 MG tablet Take 40 mg by mouth daily.     senna-docusate (SENNA S) 8.6-50 MG tablet Take 2 tablets by mouth at bedtime. 60 tablet 1   No current facility-administered medications for this visit.    PHYSICAL EXAMINATION: Telemedicine visit  LABORATORY DATA:  I have  reviewed the data as listed    Latest Ref Rng & Units 06/13/2022    1:18 PM 06/10/2022   11:30 AM 04/07/2022    9:28 AM  CBC  WBC 4.0 - 10.5 K/uL 3.6  3.7  5.1   Hemoglobin 12.0 - 15.0 g/dL 13.3  13.4  13.3   Hematocrit 36.0 - 46.0 % 40.3  42.5  42.2   Platelets 150 - 400 K/uL 198  203  261         Latest Ref Rng & Units 06/13/2022    1:18 PM 06/10/2022   11:30 AM 06/04/2022    5:32 PM  CMP  Glucose 70 - 99 mg/dL 97  93    BUN 8 - 23 mg/dL 13  14    Creatinine 0.44 - 1.00 mg/dL 1.02  0.76  1.10   Sodium 135 - 145 mmol/L 143  141    Potassium 3.5 - 5.1 mmol/L 3.9  3.2    Chloride 98 - 111 mmol/L 106  109    CO2 22 - 32 mmol/L 30  24    Calcium 8.9 - 10.3 mg/dL 9.2  9.5    Total Protein 6.5 - 8.1 g/dL 7.1     Total Bilirubin 0.3 - 1.2 mg/dL 0.4     Alkaline Phos 38 - 126 U/L 91     AST 15 - 41 U/L 16     ALT 0 - 44 U/L 11             RADIOGRAPHIC STUDIES: .IR IMAGING GUIDED PORT INSERTION  Result Date: 06/25/2022 CLINICAL DATA:  T-cell lymphoma and need for porta cath for chemotherapy. EXAM: IMPLANTED PORT  A CATH PLACEMENT WITH ULTRASOUND AND FLUOROSCOPIC GUIDANCE ANESTHESIA/SEDATION: Moderate (conscious) sedation was employed during this procedure. A total of Versed 2.0 mg and Fentanyl 100 mcg was administered intravenously by radiology nursing. Moderate Sedation Time: 37 minutes. The patient's level of consciousness and vital signs were monitored continuously by radiology nursing throughout the procedure under my direct supervision. FLUOROSCOPY: 30 seconds.  3.0 mGy. PROCEDURE: The procedure, risks, benefits, and alternatives were explained to the patient. Questions regarding the procedure were encouraged and answered. The patient understands and consents to the procedure. A time-out was performed prior to initiating the procedure. Ultrasound was utilized to confirm patency of the right internal jugular vein. A permanent ultrasound image was recorded. The right neck and chest were prepped with chlorhexidine in a sterile fashion, and a sterile drape was applied covering the operative field. Maximum barrier sterile technique with sterile gowns and gloves were used for the procedure. Local anesthesia was provided with 1% lidocaine. After creating a small venotomy incision, a 21 gauge needle was advanced into the right internal jugular vein under direct, real-time ultrasound guidance. Ultrasound image documentation was performed. After securing guidewire access, an 8 Fr dilator was placed. A J-wire was kinked to measure appropriate catheter length. A subcutaneous port pocket was then created along the upper chest wall utilizing sharp and blunt dissection. Portable cautery was utilized. The pocket was irrigated with sterile saline. A single lumen power injectable port was chosen for placement. The 8 Fr catheter was tunneled from the port pocket site to the venotomy incision. The port was placed in the pocket. External catheter was trimmed to appropriate length based on guidewire measurement. At the venotomy, an 8 Fr  peel-away sheath was placed over a guidewire. The catheter was then placed through the sheath and the sheath removed. Final catheter positioning  was confirmed and documented with a fluoroscopic spot image. The port was accessed with a needle and aspirated and flushed with heparinized saline. The access needle was removed. The venotomy and port pocket incisions were closed with subcutaneous 3-0 Monocryl and subcuticular 4-0 Vicryl. Dermabond was applied to both incisions. COMPLICATIONS: COMPLICATIONS None FINDINGS: After catheter placement, the tip lies at the cavo-atrial junction. The catheter aspirates normally and is ready for immediate use. IMPRESSION: Placement of single lumen port a cath via right internal jugular vein. The catheter tip lies at the cavo-atrial junction. A power injectable port a cath was placed and is ready for immediate use. Electronically Signed   By: Aletta Edouard M.D.   On: 06/25/2022 17:22   NM PET Image Restag (PS) Skull Base To Thigh  Result Date: 06/19/2022 CLINICAL DATA:  Initial treatment strategy for T-cell lymphoma. Also history of anal cancer and remote history of breast cancer EXAM: NUCLEAR MEDICINE PET SKULL BASE TO THIGH TECHNIQUE: 10.7 mCi F-18 FDG was injected intravenously. Full-ring PET imaging was performed from the skull base to thigh after the radiotracer. CT data was obtained and used for attenuation correction and anatomic localization. Fasting blood glucose: 97 mg/dl COMPARISON:  Multiple exams, including CT neck 06/04/2022; CT scan 07/09/2021; PET-CT 11/01/2019 FINDINGS: Mediastinal blood pool activity: SUV max 2.6 Liver activity: SUV max 4.0 NECK: Hypermetabolic right infra-auricular lymph node along with hypermetabolic right mandibular node, right submandibular lymph node, right level IIa adenopathy, bilateral level III and IV adenopathy as well as a right V lymph node. Index right infra-auricular node measures 1.8 cm in short axis with maximum SUV 14.4,  De[place]ville 5. This previously measured 1.8 cm in short axis on 06/04/2022 and was not present on the prior PET-CT. Index left level III lymph node measures 0.9 cm in short axis on image 42 series 4 with maximum SUV 8.4, [place] 5. Index right level IV lymph node on image 45 series 4 measures 1.3 cm in short axis with maximum SUV 11.2, [place] 5. Incidental CT findings: Small mucous retention cyst of the left maxillary sinus. CHEST: No significant abnormal hypermetabolic activity in this region. Incidental CT findings: Bilateral mastectomies. Thoracic aortic atherosclerosis. ABDOMEN/PELVIS: Small focus of hypermetabolic activity in segment 4 of the liver just above the level of the gallbladder fossa, maximum SUV 5.4 which is mildly above background liver activity (Deauville 4). This focus measures only about 1 cm in diameter. An early metastatic focus is not excluded. Incidental CT findings: Cholecystectomy. Pelvis partially obscured on the CT dated due to streak artifact from bilateral hip implants. Small supraumbilical hernias contain adipose tissue. SKELETON: Accentuated activity around the right shoulder prosthesis, considered physiologic. There is accentuated activity at the severe degenerative findings of the left glenohumeral joint, considered benign. Incidental CT findings: Bilateral hip prostheses. Dextroconvex lumbar scoliosis with rotary component. IMPRESSION: 1. Hypermetabolic bilateral neck adenopathy, Deauville 5, compatible with active malignancy. 2. New approximately 1 cm focus of mildly accentuated metabolic activity in segment 4 of the liver, [place] 4. Early metastatic focus is a distinct possibility. Hepatic protocol MRI with and without contrast could be utilized to confirm the presence of a corresponding lesion, which would seemingly be more likely to be related to the patient's anal carcinoma rather than lymphoma based on location. 3. Other imaging findings of potential clinical  significance: Mild chronic left maxillary sinusitis. Aortic Atherosclerosis (ICD10-I70.0). Lumbar scoliosis. Small supraumbilical hernias contain adipose tissue. Electronically Signed   By: Van Clines M.D.   On: 06/19/2022  11:40   ECHOCARDIOGRAM COMPLETE  Result Date: 06/17/2022    ECHOCARDIOGRAM REPORT   Patient Name:   Cynthia Velasquez Date of Exam: 06/17/2022 Medical Rec #:  [id number]     Height:       65.0 in Accession #:    [id number]    Weight:       216.1 lb Date of Birth:  06/08/1951    BSA:          2.044 m Patient Age:    9 years      BP:           151/88 mmHg Patient Gender: F             HR:           65 bpm. Exam Location:  Outpatient Procedure: 2D Echo, 3D Echo, Color Doppler, Cardiac Doppler and Strain Analysis Indications:    Chemo Z09  History:        Patient has prior history of Echocardiogram examinations, most                 recent 11/24/2019. Risk Factors:Hypertension and Dyslipidemia.                 Past history of breast cancer.  Sonographer:    [name] RDCS Referring Phys: [id number] [facility]  Sonographer Comments: Suboptimal parasternal window. IMPRESSIONS  1. Left ventricular ejection fraction, by estimation, is 70 to 75%. The left ventricle has hyperdynamic function. The left ventricle has no regional wall motion abnormalities. Left ventricular diastolic parameters are consistent with Grade I diastolic dysfunction (impaired relaxation). The average left ventricular global longitudinal strain is -21.7 %. The global longitudinal strain is normal.  2. Right ventricular systolic function is normal. The right ventricular size is normal. Tricuspid regurgitation signal is inadequate for assessing PA pressure.  3. The mitral valve is grossly normal. No evidence of mitral valve regurgitation.  4. The aortic valve is tricuspid. Aortic valve regurgitation is not visualized.  5. The inferior vena cava is normal in size with greater than 50% respiratory variability, suggesting right  atrial pressure of 3 mmHg. Comparison(s): Changes from prior study are noted. 11/24/2019: LVEF 60-65%, GLS 23.2%. FINDINGS  Left Ventricle: Left ventricular ejection fraction, by estimation, is 70 to 75%. The left ventricle has hyperdynamic function. The left ventricle has no regional wall motion abnormalities. The average left ventricular global longitudinal strain is -21.7  %. The global longitudinal strain is normal. The left ventricular internal cavity size was normal in size. There is no left ventricular hypertrophy. Left ventricular diastolic parameters are consistent with Grade I diastolic dysfunction (impaired relaxation). Indeterminate filling pressures. Right Ventricle: The right ventricular size is normal. No increase in right ventricular wall thickness. Right ventricular systolic function is normal. Tricuspid regurgitation signal is inadequate for assessing PA pressure. Left Atrium: Left atrial size was normal in size. Right Atrium: Right atrial size was normal in size. Pericardium: There is no evidence of pericardial effusion. Mitral Valve: The mitral valve is grossly normal. No evidence of mitral valve regurgitation. Tricuspid Valve: The tricuspid valve is grossly normal. Tricuspid valve regurgitation is not demonstrated. Aortic Valve: The aortic valve is tricuspid. Aortic valve regurgitation is not visualized. Pulmonic Valve: The pulmonic valve was normal in structure. Pulmonic valve regurgitation is not visualized. Aorta: The aortic root and ascending aorta are structurally normal, with no evidence of dilitation. Venous: The inferior vena cava is normal in size with greater than 50% respiratory variability, suggesting  right atrial pressure of 3 mmHg. IAS/Shunts: No atrial level shunt detected by color flow Doppler.  LEFT VENTRICLE PLAX 2D LVIDd:         3.50 cm   Diastology LVIDs:         2.10 cm   LV e' medial:    4.82 cm/s LV PW:         0.90 cm   LV E/e' medial:  10.4 LV IVS:        1.10 cm   LV e'  lateral:   5.34 cm/s LVOT diam:     1.90 cm   LV E/e' lateral: 9.4 LV SV:         58 LV SV Index:   28        2D Longitudinal Strain LVOT Area:     2.84 cm  2D Strain GLS (A2C):   -21.4 %                          2D Strain GLS (A3C):   -20.2 %                          2D Strain GLS (A4C):   -23.6 %                          2D Strain GLS Avg:     -21.7 % RIGHT VENTRICLE RV S prime:     12.70 cm/s TAPSE (M-mode): 1.5 cm LEFT ATRIUM             Index        RIGHT ATRIUM          Index LA diam:        2.80 cm 1.37 cm/m   RA Area:     9.69 cm LA Vol (A2C):   32.5 ml 15.90 ml/m  RA Volume:   16.70 ml 8.17 ml/m LA Vol (A4C):   31.9 ml 15.60 ml/m LA Biplane Vol: 32.3 ml 15.80 ml/m  AORTIC VALVE LVOT Vmax:   104.00 cm/s LVOT Vmean:  69.800 cm/s LVOT VTI:    0.205 m  AORTA Ao Root diam: 3.30 cm Ao Asc diam:  3.40 cm MITRAL VALVE MV Area (PHT): 2.21 cm    SHUNTS MV Decel Time: 344 msec    Systemic VTI:  0.20 m MV E velocity: 50.10 cm/s  Systemic Diam: 1.90 cm MV A velocity: 82.70 cm/s MV E/A ratio:  0.61 Lyman Bishop MD Electronically signed by Lyman Bishop MD Signature Date/Time: 06/17/2022/3:45:06 PM    Final    [name] CORE BIOPSY (LYMPH NODES)  Result Date: 06/10/2022 INDICATION: Nonpainful right neck mass.  No history of primary malignancy EXAM: ULTRASOUND GUIDED CORE BIOPSY OF RIGHT NECK MASS MEDICATIONS: Lidocaine 1% subcutaneous ANESTHESIA/SEDATION: Intravenous Fentanyl 121mg and Versed 232mwere administered as conscious sedation during continuous monitoring of the patient's level of consciousness and physiological / cardiorespiratory status by the radiology RN, with a total moderate sedation time of 12 minutes. PROCEDURE: The procedure, risks, benefits, and alternatives were explained to the patient. Questions regarding the procedure were encouraged and answered. The patient understands and consents to the procedure. Survey ultrasound of the right neck performed. Hypoechoic adenopathy was localized. An  appropriate skin site was determined. The operative field was prepped with chlorhexidine in a sterile fashion, and a sterile drape was applied covering the operative field. A sterile  gown and sterile gloves were used for the procedure. Local anesthesia was provided with 1% Lidocaine. Under real-time ultrasound guidance, multiple 18 gauge core biopsy samples were obtained with a not of a needle, submitted to surgical pathology. COMPLICATIONS: None immediate. FINDINGS: Hypoechoic right cervical adenopathy localized. Representative core biopsy samples obtained as above. IMPRESSION: 1. Technically successful ultrasound-guided core biopsy, right neck mass. Electronically Signed   By: Lucrezia Europe M.D.   On: 06/10/2022 14:41   CT Soft Tissue Neck W Contrast  Result Date: 06/06/2022 CLINICAL DATA:  Right neck mass non pulsatile EXAM: CT NECK WITH CONTRAST TECHNIQUE: Multidetector CT imaging of the neck was performed using the standard protocol following the bolus administration of intravenous contrast. RADIATION DOSE REDUCTION: This exam was performed according to the departmental dose-optimization program which includes automated exposure control, adjustment of the mA and/or kV according to patient size and/or use of iterative reconstruction technique. CONTRAST:  18m OMNIPAQUE IOHEXOL 300 MG/ML  SOLN COMPARISON:  None Available. FINDINGS: Pharynx and larynx: Normal. No mass or swelling. Salivary glands: No inflammation, mass, or stone. Thyroid: 11 mm right thyroid nodule. No further imaging necessary (ref: J Am Coll Radiol. 2015 Feb;12(2): 143-50). Lymph nodes: Enlarged lymph nodes right neck. Lymph node just below the parotid gland measures 21 x 25 mm. Below this and superficial to the sternocleidomastoid muscle is a lymph node measuring 12.5 mm. Right level 2 lymph node 10 mm. Small right supraclavicular lymph nodes measuring 10 mm. 10 mm lymph node posterior to the left sternocleidomastoid muscle below the hyoid  bone. Vascular: Normal vascular enhancement Limited intracranial: Negative Visualized orbits: Negative Mastoids and visualized paranasal sinuses: Mild mucosal edema left maxillary sinus. Remaining sinuses clear Skeleton: Cervical spondylosis.  No acute abnormality. Upper chest: Lung apices clear bilaterally Other: None IMPRESSION: Multiple enlarged lymph nodes in the right neck. Recommend tissue sampling to rule out metastatic disease or lymphoma. No pharyngeal mass. Electronically Signed   By: CFranchot GalloM.D.   On: 06/06/2022 15:38     ASSESSMENT & PLAN:  Cynthia Velasquez a 71y.o. female with   1.  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 Plan -Results of excisional lymph node biopsy showing peripheral T-cell lymphoma not otherwise specified which is CD30 negative were discussed in detail with the patient. PET CT scan was previously reviewed and she appears to have at least stage II disease but depending on the liver lesion etiology this could represent stage IV disease. She has had anthracycline exposure as a part of her AC regimen for previous treatment of breast cancer and is therefore limited in her total lifetime anthracycline use. CT 30 negative status precludes use of Adcetris. We recommended proceeding with treatment with CEOP and depending on response after 2-3 cycles we could consider using a dose reduced version of anthracyclines with Zinecard for cardiac protection. -We will start the patient on short burst of prednisone to help with decreasing some of her lymph node related pain in the neck pending start of chemotherapy. -Port-A-Cath placed Echo shows normal ejection fraction. -We will need to be mindful of any cytopenias that might arise given patient has had heavy previous treatment with chemotherapy for both her breast cancer as well as anal squamous cell carcinoma. We will dose reduce etoposide and Cytoxan for her first cycle of treatment and adjust  accordingly. -Will need G-CSF support with treatment -We will plan to repeat PET CT scan after 3 cycles of treatment -Chemo therapy orders placed and coordinated.  2. Anal Squamous Cell Carcinoma, [id number]-currently in remission.  Details as noted above   3. 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 She was adopted, no known family history     FOLLOW UP: Follow-up as per integrated scheduling orders  The total time spent in the appointment was 41 minutes*.  All of the patient's questions were answered with apparent satisfaction. The patient knows to call the clinic with any problems, questions or concerns.  Sullivan Lone MD MS AAHIVMS [facility] Hematology/Oncology Physician [facility]  .*Total Encounter Time as defined by the Centers for Medicare and Medicaid Services includes, in addition to the face-to-face time of a patient visit (documented in the note above) non-face-to-face time: obtaining and reviewing outside history, ordering and reviewing medications, tests or procedures, care coordination (communications with other health care professionals or caregivers) and documentation in the medical record.

## 2022-07-04 ENCOUNTER — Other Ambulatory Visit: Payer: Self-pay

## 2022-07-04 DIAGNOSIS — M5416 Radiculopathy, lumbar region: Secondary | ICD-10-CM | POA: Diagnosis not present

## 2022-07-04 DIAGNOSIS — M47896 Other spondylosis, lumbar region: Secondary | ICD-10-CM | POA: Diagnosis not present

## 2022-07-05 DIAGNOSIS — C8590 Non-Hodgkin lymphoma, unspecified, unspecified site: Secondary | ICD-10-CM | POA: Insufficient documentation

## 2022-07-05 DIAGNOSIS — H9313 Tinnitus, bilateral: Secondary | ICD-10-CM | POA: Diagnosis not present

## 2022-07-05 MED FILL — Dexamethasone Sodium Phosphate Inj 100 MG/10ML: INTRAMUSCULAR | Qty: 1 | Status: AC

## 2022-07-05 MED FILL — Fosaprepitant Dimeglumine For IV Infusion 150 MG (Base Eq): INTRAVENOUS | Qty: 5 | Status: AC

## 2022-07-08 ENCOUNTER — Inpatient Hospital Stay: Payer: PPO

## 2022-07-08 ENCOUNTER — Inpatient Hospital Stay (HOSPITAL_BASED_OUTPATIENT_CLINIC_OR_DEPARTMENT_OTHER): Payer: PPO | Admitting: Hematology

## 2022-07-08 VITALS — BP 149/86 | HR 80 | Temp 98.1°F | Resp 16 | Wt 223.5 lb

## 2022-07-08 DIAGNOSIS — C8448 Peripheral T-cell lymphoma, not classified, lymph nodes of multiple sites: Secondary | ICD-10-CM

## 2022-07-08 DIAGNOSIS — Z5111 Encounter for antineoplastic chemotherapy: Secondary | ICD-10-CM

## 2022-07-08 DIAGNOSIS — D500 Iron deficiency anemia secondary to blood loss (chronic): Secondary | ICD-10-CM

## 2022-07-08 LAB — CMP (CANCER CENTER ONLY)
ALT: 22 U/L (ref 0–44)
AST: 16 U/L (ref 15–41)
Albumin: 3.5 g/dL (ref 3.5–5.0)
Alkaline Phosphatase: 85 U/L (ref 38–126)
Anion gap: 6 (ref 5–15)
BUN: 21 mg/dL (ref 8–23)
CO2: 27 mmol/L (ref 22–32)
Calcium: 8.7 mg/dL — ABNORMAL LOW (ref 8.9–10.3)
Chloride: 109 mmol/L (ref 98–111)
Creatinine: 1.04 mg/dL — ABNORMAL HIGH (ref 0.44–1.00)
GFR, Estimated: 58 mL/min — ABNORMAL LOW (ref >60)
Glucose, Bld: 102 mg/dL — ABNORMAL HIGH (ref 70–99)
Potassium: 3.3 mmol/L — ABNORMAL LOW (ref 3.5–5.1)
Sodium: 142 mmol/L (ref 135–145)
Total Bilirubin: 0.5 mg/dL (ref 0.3–1.2)
Total Protein: 6.2 g/dL — ABNORMAL LOW (ref 6.5–8.1)

## 2022-07-08 LAB — CBC WITH DIFFERENTIAL (CANCER CENTER ONLY)
Abs Immature Granulocytes: 0.01 10*3/uL (ref 0.00–0.07)
Basophils Absolute: 0 10*3/uL (ref 0.0–0.1)
Basophils Relative: 0 %
Eosinophils Absolute: 0.1 10*3/uL (ref 0.0–0.5)
Eosinophils Relative: 2 %
HCT: 36.5 % (ref 36.0–46.0)
Hemoglobin: 12.1 g/dL (ref 12.0–15.0)
Immature Granulocytes: 0 %
Lymphocytes Relative: 20 %
Lymphs Abs: 1 10*3/uL (ref 0.7–4.0)
MCH: 28.1 pg (ref 26.0–34.0)
MCHC: 33.2 g/dL (ref 30.0–36.0)
MCV: 84.7 fL (ref 80.0–100.0)
Monocytes Absolute: 0.5 10*3/uL (ref 0.1–1.0)
Monocytes Relative: 10 %
Neutro Abs: 3.4 10*3/uL (ref 1.7–7.7)
Neutrophils Relative %: 68 %
Platelet Count: 198 10*3/uL (ref 150–400)
RBC: 4.31 MIL/uL (ref 3.87–5.11)
RDW: 15 % (ref 11.5–15.5)
WBC Count: 5 10*3/uL (ref 4.0–10.5)
nRBC: 0 % (ref 0.0–0.2)

## 2022-07-08 LAB — FERRITIN: Ferritin: 30 ng/mL (ref 11–307)

## 2022-07-08 LAB — LACTATE DEHYDROGENASE: LDH: 149 U/L (ref 98–192)

## 2022-07-08 MED ORDER — SODIUM CHLORIDE 0.9 % IV SOLN
80.0000 mg/m2 | Freq: Once | INTRAVENOUS | Status: AC
  Administered 2022-07-08: 170 mg via INTRAVENOUS
  Filled 2022-07-08: qty 8.5

## 2022-07-08 MED ORDER — SODIUM CHLORIDE 0.9 % IV SOLN
150.0000 mg | Freq: Once | INTRAVENOUS | Status: AC
  Administered 2022-07-08: 150 mg via INTRAVENOUS
  Filled 2022-07-08: qty 150

## 2022-07-08 MED ORDER — SODIUM CHLORIDE 0.9 % IV SOLN: Freq: Once | INTRAVENOUS | Status: AC

## 2022-07-08 MED ORDER — SODIUM CHLORIDE 0.9% FLUSH
10.0000 mL | INTRAVENOUS | Status: DC | PRN
  Administered 2022-07-08: 10 mL

## 2022-07-08 MED ORDER — ACETAMINOPHEN 325 MG PO TABS
650.0000 mg | ORAL_TABLET | Freq: Once | ORAL | Status: AC
  Administered 2022-07-08: 650 mg via ORAL
  Filled 2022-07-08: qty 2

## 2022-07-08 MED ORDER — HEPARIN SOD (PORK) LOCK FLUSH 100 UNIT/ML IV SOLN
500.0000 [IU] | Freq: Once | INTRAVENOUS | Status: AC | PRN
  Administered 2022-07-08: 500 [IU]

## 2022-07-08 MED ORDER — VINCRISTINE SULFATE CHEMO INJECTION 1 MG/ML
2.0000 mg | Freq: Once | INTRAVENOUS | Status: AC
  Administered 2022-07-08: 2 mg via INTRAVENOUS
  Filled 2022-07-08: qty 2

## 2022-07-08 MED ORDER — SODIUM CHLORIDE 0.9 % IV SOLN
10.0000 mg | Freq: Once | INTRAVENOUS | Status: AC
  Administered 2022-07-08: 10 mg via INTRAVENOUS
  Filled 2022-07-08: qty 10

## 2022-07-08 MED ORDER — SODIUM CHLORIDE 0.9% FLUSH
10.0000 mL | INTRAVENOUS | Status: DC | PRN
  Administered 2022-07-08: 10 mL via INTRAVENOUS

## 2022-07-08 MED ORDER — LIDOCAINE-PRILOCAINE 2.5-2.5 % EX CREA: TOPICAL_CREAM | CUTANEOUS | 3 refills | Status: DC

## 2022-07-08 MED ORDER — FAMOTIDINE IN NACL 20-0.9 MG/50ML-% IV SOLN
20.0000 mg | Freq: Once | INTRAVENOUS | Status: AC
  Administered 2022-07-08: 20 mg via INTRAVENOUS
  Filled 2022-07-08: qty 50

## 2022-07-08 MED ORDER — PALONOSETRON HCL INJECTION 0.25 MG/5ML
0.2500 mg | Freq: Once | INTRAVENOUS | Status: AC
  Administered 2022-07-08: 0.25 mg via INTRAVENOUS
  Filled 2022-07-08: qty 5

## 2022-07-08 MED ORDER — SENNOSIDES-DOCUSATE SODIUM 8.6-50 MG PO TABS: 2.0000 | ORAL_TABLET | Freq: Every day | ORAL | 1 refills | Status: DC

## 2022-07-08 MED ORDER — SODIUM CHLORIDE 0.9 % IV SOLN
600.0000 mg/m2 | Freq: Once | INTRAVENOUS | Status: AC
  Administered 2022-07-08: 1280 mg via INTRAVENOUS
  Filled 2022-07-08: qty 64

## 2022-07-08 MED FILL — Dexamethasone Sodium Phosphate Inj 100 MG/10ML: INTRAMUSCULAR | Qty: 1 | Status: AC

## 2022-07-08 NOTE — Progress Notes (Signed)
[name]   Telephone:([phone]) [phone] Fax:([phone]) [place] VISIT   Patient Care Team: Brunetta Genera, MD as PCP - Hematology/Oncology (Hematology) Wonda Horner, MD as Consulting Physician (Gastroenterology) Truitt Merle, MD as Consulting Physician (Hematology) Alla Feeling, NP as Nurse Practitioner (Nurse Practitioner) Kyung Rudd, MD as Consulting Physician (Radiation Oncology) Michael Boston, MD as Consulting Physician (General Surgery) Izora Gala, MD as Attending Physician (Otolaryngology)  Date of Service: 07/08/22   CHIEF COMPLAINT: f/u of newly diagnosed T cell lymphoma  SUMMARY OF ONCOLOGIC HISTORY: Oncology History Overview Note  Cancer Staging Anal cancer [name]) Staging form: Anus, AJCC 8th Edition - Clinical stage from 06/28/2019: Stage IIA (cT2, cN0, cM0) - Signed by Alla Feeling, NP on 06/28/2019  Breast cancer of upper-outer quadrant of left female breast [name]) Staging form: Breast, AJCC 7th Edition - Clinical stage from 09/07/2013: Stage IIA (T2, N0, cM0) - Signed by [name], MD on 09/07/2013 - Pathologic: Stage IIA (T2, N0, cM0) - Signed by [name], MD on 09/07/2013  Breast cancer of upper-outer quadrant of right female breast [name]) Staging form: Breast, AJCC 7th Edition - Clinical stage from 09/07/2013: Stage IA (T1a, N0, cM0) - Signed by [name], MD on 09/07/2013 - Pathologic: Stage IA (T1a, N0, cM0) - Signed by [name], MD on 09/07/2013    Breast cancer of upper-outer quadrant of left female breast ([name])  07/10/2007 Procedure   US biopsy showed invasive ductal carcinoma 0.8cm, Nottingham grade 3   07/30/2007 Surgery   She had left breast lumpectomy and LN biopsy which showed high grade invasive ductal cancer Nottingham grade 3, 2.7 cm, ER/PR/Her2 neu negative   11/24/2007 Surgery   Patient elected for bilateral mastectomy   09/07/2008 - 03/08/2009 Chemotherapy   dates are  approximate: she received adriamycin and cytoxan followed by Taxol   03/08/2009 - 05/08/2009 Radiation Therapy   dates are approximate, she received XRT    07/09/2021 Imaging   CT CAP  IMPRESSION: 1. No evidence to suggest locally recurrent ianorectal neoplasm or metastatic disease in the chest, abdomen or pelvis. 2. Status post bilateral modified radical mastectomy and bilateral axillary lymph node dissection. 3. Three small supraumbilical ventral hernias containing only omental fat. No associated bowel incarceration or obstruction at this time. 4. Aortic atherosclerosis. 5. Additional incidental findings, as above.   Breast cancer of upper-outer quadrant of right female breast ([name])  07/10/2006 Procedure   stereotactic biopsy showed atypical hyperplasia   10/23/2006 Surgery   right lumpectomy showed invasive ductal ca (Nottingham grade 1)  8m and DCIS, ER/PR positive Her 2 negative   10/09/2007 - 10/08/2012 Chemotherapy   Patient was placed on Tamoxifen   03/24/2017 Imaging   No acute findings. No evidence of recurrent carcinoma or metastatic disease   07/09/2021 Imaging   CT CAP  IMPRESSION: 1. No evidence to suggest locally recurrent ianorectal neoplasm or metastatic disease in the chest, abdomen or pelvis. 2. Status post bilateral modified radical mastectomy and bilateral axillary lymph node dissection. 3. Three small supraumbilical ventral hernias containing only omental fat. No associated bowel incarceration or obstruction at this time. 4. Aortic atherosclerosis. 5. Additional incidental findings, as above.   [place] ([name]  06/04/2019 Procedure   Colonoscopy per Dr. SAnson Fret Findings-the digital rectal exam revealed a 3 cm diameter firm rectal mass.  The mass was noncircumferential and located predominantly at the right bowel wall at the anorectal junction. A nonobstructing mass was  found at the anus and in the rectum    06/04/2019 Initial Biopsy   Follow  pathology: Large intestine, rectum biopsy: Invasive well to moderately differentiated squamous cell carcinoma.  No rectal mucosa present.  There is strong diffuse expression of P 16 immunostain.  CDX 2, p63 and mCEA immunostains are also used in the diagnostic work-up of the case.   06/04/2019 Initial Diagnosis   Anal cancer ([name])   06/21/2019 PET scan   IMPRESSION: 1. Anorectal primary. No hypermetabolic metastatic disease within the chest, abdomen, or pelvis. Perirectal nodes, at least 1 of which is new since 02/26/2018 CT, suspicious based on size and interval development. 2. Mild limitations secondary to beam hardening artifact from bilateral hip arthroplasty. 3.  Aortic Atherosclerosis (ICD10-I70.0).   06/28/2019 Cancer Staging   Staging form: Anus, AJCC 8th Edition - Clinical stage from 06/28/2019: Stage IIA (cT2, cN0, cM0) - Signed by Alla Feeling, NP on 06/28/2019   06/28/2019 - 07/26/2019 Chemotherapy   concurrent chemoRT with Mitomycin and 5FU on week 1 and week 5 on starting 06/28/19. Last dose on 07/26/19   06/28/2019 - 08/09/2019 Radiation Therapy   concurrent chemoRT with Dr [name] 06/28/19-08/09/19   11/01/2019 PET scan   IMPRESSION: 1. Marked interval decrease in hypermetabolism noted at the level of the anal rectal primary. No evidence for hypermetabolic metastatic disease in the chest, abdomen, or pelvis. The perirectal lymph nodes identified previously have resolved in the interval. 2.  Aortic Atherosclerois (ICD10-170.0)   08/07/2020 Imaging   CT CAP  IMPRESSION: 1. No findings for residual/recurrent anal tumor, regional lymphadenopathy or metastatic disease. 2. Status post cholecystectomy. No biliary dilatation. 3. Small anterior abdominal wall hernia containing fat. 4. Aortic atherosclerosis.   Aortic Atherosclerosis (ICD10-I70.0).     07/09/2021 Imaging   CT CAP  IMPRESSION: 1. No evidence to suggest locally recurrent ianorectal neoplasm  or metastatic disease in the chest, abdomen or pelvis. 2. Status post bilateral modified radical mastectomy and bilateral axillary lymph node dissection. 3. Three small supraumbilical ventral hernias containing only omental fat. No associated bowel incarceration or obstruction at this time. 4. Aortic atherosclerosis. 5. Additional incidental findings, as above.   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 for evaluation and management of newly diagnosed T-cell non-Hodgkin's lymphoma.  Patient has a history of bilateral breast cancer status post mastectomies.  -2007: s/p right lumpectomy for ADH in 08/2006 showed DCIS ER/PR+, grade 1. 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 She was adopted, no known family history    She subsequently was diagnosed with anal squamous cell carcinoma cT2 N0 M0 diagnosed in September 2020.Workup showed a 3 cm mass at the anorectal junction, biopsy confirmed well to moderately differentiated squamous cell carcinoma. 06/21/19 PET scan showed no metastasis.  -S/p concurrent chemoRT with Mitomycin and 5FU on 08/09/19. Now on surveillance. -surveillance CT CAP 07/09/21 was NED. -surveillance colonoscopy done for rectal bleeding on 12/07/21 showed  only radiation changes. Her rectal bleeding has resolved.  Recently patient presented to her primary care physician on 05/23/2022 with her new right neck lump which has been  progressively enlarging.  She also subsequently developed a left neck swelling.  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  She has had a PET CT scan done on 06/19/2022 which showed hypermetabolic bilateral neck lymphadenopathy.  Also noted to have a new 1 cm focus of metabolic activity in segment 4 of the liver.  This is etiology indeterminate. Lumbar scoliosis and small supraumbilical hernia.  Patient has had Port-A-Cath placement done on 06/25/2022. Echocardiogram was done on 06/17/2022 and shows normal ejection fraction of 70 to 75% with grade 1 diastolic dysfunction.  Right ventricular systolic function within normal limits.  INTERVAL HISTORY Dailey Alberson is here today for evaluation and management of newly diagnosed T-cell non-Hodgkin's lymphoma. She is starting her cycle 1 of CEOP today.  I had a phone visit with the patient on 06/27/2022 and patient complained of tightness in the neck bilaterally and persistent significant neck swelling.   She reports she is doing well today without any new medical concerns. She had several questions about her chemotherapy, which were answered.  She complains of right neck soreness, constipation, diarrhea, and night sweat. She denies fever, chill, abdominal pain, new lumps, leg swelling, and pain at port site.   ROS 10 Point review of Systems was done is negative except as noted above.  MEDICAL HISTORY:  Past Medical History:  Diagnosis Date   Anal cancer ([place]) dx'd 05/2019   Anxiety    Arthritis    Bilateral breast cancer ([place]) 10/21/2013   Breast cancer ([place])    Cancer ([place])    Depression    Gallstones    GERD (gastroesophageal reflux disease)    doesn't take any meds for this   Headache    h/o migraines       History of bladder infections    History of hiatal hernia    History of migraine    Hyperlipidemia    takes  Simvastatin daily   Hypertension    takes Hyzaar   Joint pain    Joint swelling    Lumbar stenosis    Neuropathy 09/07/2013   Pneumonia    Pre-diabetes     SURGICAL HISTORY: Past Surgical History:  Procedure Laterality Date   ABDOMINAL HYSTERECTOMY     bone spur removed from left foot     CHOLECYSTECTOMY     COLONOSCOPY     COLONOSCOPY WITH PROPOFOL Bilateral 12/07/2021   Procedure: COLONOSCOPY WITH PROPOFOL;  Surgeon: Clarene Essex, MD;  Location: [facility];  Service: Endoscopy;  Laterality: Bilateral;   double mastectomy      ESOPHAGOGASTRODUODENOSCOPY N/A 12/07/2021   Procedure: ESOPHAGOGASTRODUODENOSCOPY (EGD);  Surgeon: Clarene Essex, MD;  Location: [facility];  Service: Endoscopy;  Laterality: N/A;   HERNIA REPAIR     umbilical   IR IMAGING GUIDED PORT INSERTION  06/25/2022   right knee arthroscopy     right shoulder arthroscopy     surgery for hiatal hernia     TONSILLECTOMY     TOTAL HIP ARTHROPLASTY Left 02/12/2013   TOTAL HIP ARTHROPLASTY Left 02/12/2013   Procedure: LEFT TOTAL HIP ARTHROPLASTY;  Surgeon: Kerin Salen, MD;  Location: [place];  Service: Orthopedics;  Laterality: Left;   TOTAL HIP ARTHROPLASTY Right 05/03/2013   Procedure: TOTAL  HIP ARTHROPLASTY;  Surgeon: Kerin Salen, MD;  Location: [place];  Service: Orthopedics;  Laterality: Right;   TOTAL KNEE ARTHROPLASTY Right 02/27/2015   Procedure: TOTAL KNEE ARTHROPLASTY;  Surgeon: Frederik Pear, MD;  Location: [place];  Service: Orthopedics;  Laterality: Right;   TOTAL SHOULDER ARTHROPLASTY Right 09/12/2016   Procedure: RIGHT TOTAL SHOULDER ARTHROPLASTY;  Surgeon: Tania Ade, MD;  Location: [place];  Service: Orthopedics;  Laterality: Right;  RIGHT TOTAL SHOULDER ARTHROPLASTY    I have reviewed the social history and family history with the patient and they are unchanged from previous note.  ALLERGIES:  is allergic to oxycodone-acetaminophen, tramadol, codeine, and vicodin  [hydrocodone-acetaminophen].  MEDICATIONS:  Current Outpatient Medications  Medication Sig Dispense Refill   acyclovir (ZOVIRAX) 400 MG tablet Take 1 tablet (400 mg total) by mouth daily. 30 tablet 3   aspirin-acetaminophen-caffeine (EXCEDRIN MIGRAINE) 203-559-74 MG tablet Take 2 tablets by mouth every 6 (six) hours as needed for headache.     HYDROcodone-acetaminophen (NORCO) 5-325 MG tablet Take 1 tablet by mouth every 6 (six) hours as needed for moderate pain. 30 tablet 0   lidocaine-prilocaine (EMLA) cream Apply to affected area once 30 g 3   LINZESS 72 MCG capsule Take 72 mcg by mouth daily as needed (constipation).     loratadine (CLARITIN) 10 MG tablet Take 1 tablet (10 mg total) by mouth daily. 30 tablet 0   losartan (COZAAR) 100 MG tablet Take 100 mg by mouth daily.  6   meclizine (ANTIVERT) 12.5 MG tablet Take 1 tablet (12.5 mg total) by mouth 3 (three) times daily as needed for dizziness. 30 tablet 0   Menthol-Methyl Salicylate (SALONPAS PAIN RELIEF PATCH EX) Apply 1 patch topically daily as needed (pain).     ondansetron (ZOFRAN) 8 MG tablet Take 1 tablet (8 mg total) by mouth every 8 (eight) hours as needed for nausea or vomiting. Start on the third Velasquez after cyclophosphamide chemotherapy. 30 tablet 1   predniSONE (DELTASONE) 20 MG tablet Take 3 tablets (60 mg total) by mouth daily. Take for 2 days starting the Velasquez after chemotherapy on Velasquez 4 30 tablet 0   prochlorperazine (COMPAZINE) 10 MG tablet Take 1 tablet (10 mg total) by mouth every 6 (six) hours as needed for nausea or vomiting. 30 tablet 6   rosuvastatin (CRESTOR) 40 MG tablet Take 40 mg by mouth daily.     senna-docusate (SENNA S) 8.6-50 MG tablet Take 2 tablets by mouth at bedtime. 60 tablet 1   No current facility-administered medications for this visit.    PHYSICAL EXAMINATION: Telemedicine visit  LABORATORY DATA:  I have reviewed the data as listed    Latest Ref Rng & Units 06/13/2022    1:18 PM 06/10/2022    11:30 AM 04/07/2022    9:28 AM  CBC  WBC 4.0 - 10.5 K/uL 3.6  3.7  5.1   Hemoglobin 12.0 - 15.0 g/dL 13.3  13.4  13.3   Hematocrit 36.0 - 46.0 % 40.3  42.5  42.2   Platelets 150 - 400 K/uL 198  203  261         Latest Ref Rng & Units 06/13/2022    1:18 PM 06/10/2022   11:30 AM 06/04/2022    5:32 PM  CMP  Glucose 70 - 99 mg/dL 97  93    BUN 8 - 23 mg/dL 13  14    Creatinine 0.44 - 1.00 mg/dL 1.02  0.76  1.10   Sodium 135 -  145 mmol/L 143  141    Potassium 3.5 - 5.1 mmol/L 3.9  3.2    Chloride 98 - 111 mmol/L 106  109    CO2 22 - 32 mmol/L 30  24    Calcium 8.9 - 10.3 mg/dL 9.2  9.5    Total Protein 6.5 - 8.1 g/dL 7.1     Total Bilirubin 0.3 - 1.2 mg/dL 0.4     Alkaline Phos 38 - 126 U/L 91     AST 15 - 41 U/L 16     ALT 0 - 44 U/L 11             RADIOGRAPHIC STUDIES: .IR IMAGING GUIDED PORT INSERTION  Result Date: 06/25/2022 CLINICAL DATA:  T-cell lymphoma and need for porta cath for chemotherapy. EXAM: IMPLANTED PORT A CATH PLACEMENT WITH ULTRASOUND AND FLUOROSCOPIC GUIDANCE ANESTHESIA/SEDATION: Moderate (conscious) sedation was employed during this procedure. A total of Versed 2.0 mg and Fentanyl 100 mcg was administered intravenously by radiology nursing. Moderate Sedation Time: 37 minutes. The patient's level of consciousness and vital signs were monitored continuously by radiology nursing throughout the procedure under my direct supervision. FLUOROSCOPY: 30 seconds.  3.0 mGy. PROCEDURE: The procedure, risks, benefits, and alternatives were explained to the patient. Questions regarding the procedure were encouraged and answered. The patient understands and consents to the procedure. A time-out was performed prior to initiating the procedure. Ultrasound was utilized to confirm patency of the right internal jugular vein. A permanent ultrasound image was recorded. The right neck and chest were prepped with chlorhexidine in a sterile fashion, and a sterile drape was applied  covering the operative field. Maximum barrier sterile technique with sterile gowns and gloves were used for the procedure. Local anesthesia was provided with 1% lidocaine. After creating a small venotomy incision, a 21 gauge needle was advanced into the right internal jugular vein under direct, real-time ultrasound guidance. Ultrasound image documentation was performed. After securing guidewire access, an 8 Fr dilator was placed. A J-wire was kinked to measure appropriate catheter length. A subcutaneous port pocket was then created along the upper chest wall utilizing sharp and blunt dissection. Portable cautery was utilized. The pocket was irrigated with sterile saline. A single lumen power injectable port was chosen for placement. The 8 Fr catheter was tunneled from the port pocket site to the venotomy incision. The port was placed in the pocket. External catheter was trimmed to appropriate length based on guidewire measurement. At the venotomy, an 8 Fr peel-away sheath was placed over a guidewire. The catheter was then placed through the sheath and the sheath removed. Final catheter positioning was confirmed and documented with a fluoroscopic spot image. The port was accessed with a needle and aspirated and flushed with heparinized saline. The access needle was removed. The venotomy and port pocket incisions were closed with subcutaneous 3-0 Monocryl and subcuticular 4-0 Vicryl. Dermabond was applied to both incisions. COMPLICATIONS: COMPLICATIONS None FINDINGS: After catheter placement, the tip lies at the cavo-atrial junction. The catheter aspirates normally and is ready for immediate use. IMPRESSION: Placement of single lumen port a cath via right internal jugular vein. The catheter tip lies at the cavo-atrial junction. A power injectable port a cath was placed and is ready for immediate use. Electronically Signed   By: Aletta Edouard M.D.   On: 06/25/2022 17:22   NM PET Image Restag (PS) Skull Base To  Thigh  Result Date: 06/19/2022 CLINICAL DATA:  Initial treatment strategy for T-cell lymphoma. Also history  of anal cancer and remote history of breast cancer EXAM: NUCLEAR MEDICINE PET SKULL BASE TO THIGH TECHNIQUE: 10.7 mCi F-18 FDG was injected intravenously. Full-ring PET imaging was performed from the skull base to thigh after the radiotracer. CT data was obtained and used for attenuation correction and anatomic localization. Fasting blood glucose: 97 mg/dl COMPARISON:  Multiple exams, including CT neck 06/04/2022; CT scan 07/09/2021; PET-CT 11/01/2019 FINDINGS: Mediastinal blood pool activity: SUV max 2.6 Liver activity: SUV max 4.0 NECK: Hypermetabolic right infra-auricular lymph node along with hypermetabolic right mandibular node, right submandibular lymph node, right level IIa adenopathy, bilateral level III and IV adenopathy as well as a right V lymph node. Index right infra-auricular node measures 1.8 cm in short axis with maximum SUV 14.4, De[place] 5. This previously measured 1.8 cm in short axis on 06/04/2022 and was not present on the prior PET-CT. Index left level III lymph node measures 0.9 cm in short axis on image 42 series 4 with maximum SUV 8.4, [place] 5. Index right level IV lymph node on image 45 series 4 measures 1.3 cm in short axis with maximum SUV 11.2, [place] 5. Incidental CT findings: Small mucous retention cyst of the left maxillary sinus. CHEST: No significant abnormal hypermetabolic activity in this region. Incidental CT findings: Bilateral mastectomies. Thoracic aortic atherosclerosis. ABDOMEN/PELVIS: Small focus of hypermetabolic activity in segment 4 of the liver just above the level of the gallbladder fossa, maximum SUV 5.4 which is mildly above background liver activity ([place]ville 4). This focus measures only about 1 cm in diameter. An early metastatic focus is not excluded. Incidental CT findings: Cholecystectomy. Pelvis partially obscured on the CT dated due to streak  artifact from bilateral hip implants. Small supraumbilical hernias contain adipose tissue. SKELETON: Accentuated activity around the right shoulder prosthesis, considered physiologic. There is accentuated activity at the severe degenerative findings of the left glenohumeral joint, considered benign. Incidental CT findings: Bilateral hip prostheses. Dextroconvex lumbar scoliosis with rotary component. IMPRESSION: 1. Hypermetabolic bilateral neck adenopathy, [place] 5, compatible with active malignancy. 2. New approximately 1 cm focus of mildly accentuated metabolic activity in segment 4 of the liver, [place] 4. Early metastatic focus is a distinct possibility. Hepatic protocol MRI with and without contrast could be utilized to confirm the presence of a corresponding lesion, which would seemingly be more likely to be related to the patient's anal carcinoma rather than lymphoma based on location. 3. Other imaging findings of potential clinical significance: Mild chronic left maxillary sinusitis. Aortic Atherosclerosis (ICD10-I70.0). Lumbar scoliosis. Small supraumbilical hernias contain adipose tissue. Electronically Signed   By: Van Clines M.D.   On: 06/19/2022 11:40   ECHOCARDIOGRAM COMPLETE  Result Date: 06/17/2022    ECHOCARDIOGRAM REPORT   Patient Name:   GRACLYNN VANANTWERP Date of Exam: 06/17/2022 Medical Rec #:  [id number]     Height:       65.0 in Accession #:    [id number]    Weight:       216.1 lb Date of Birth:  03/22/1951    BSA:          2.044 m Patient Age:    71 years      BP:           151/88 mmHg Patient Gender: F             HR:           65 bpm. Exam Location:  Outpatient Procedure: 2D Echo, 3D Echo, Color Doppler, Cardiac Doppler and Strain  Analysis Indications:    Chemo Z09  History:        Patient has prior history of Echocardiogram examinations, most                 recent 11/24/2019. Risk Factors:Hypertension and Dyslipidemia.                 Past history of breast cancer.  Sonographer:     [name] RDCS Referring Phys: [id number] [facility]  Sonographer Comments: Suboptimal parasternal window. IMPRESSIONS  1. Left ventricular ejection fraction, by estimation, is 70 to 75%. The left ventricle has hyperdynamic function. The left ventricle has no regional wall motion abnormalities. Left ventricular diastolic parameters are consistent with Grade I diastolic dysfunction (impaired relaxation). The average left ventricular global longitudinal strain is -21.7 %. The global longitudinal strain is normal.  2. Right ventricular systolic function is normal. The right ventricular size is normal. Tricuspid regurgitation signal is inadequate for assessing PA pressure.  3. The mitral valve is grossly normal. No evidence of mitral valve regurgitation.  4. The aortic valve is tricuspid. Aortic valve regurgitation is not visualized.  5. The inferior vena cava is normal in size with greater than 50% respiratory variability, suggesting right atrial pressure of 3 mmHg. Comparison(s): Changes from prior study are noted. 11/24/2019: LVEF 60-65%, GLS 23.2%. FINDINGS  Left Ventricle: Left ventricular ejection fraction, by estimation, is 70 to 75%. The left ventricle has hyperdynamic function. The left ventricle has no regional wall motion abnormalities. The average left ventricular global longitudinal strain is -21.7  %. The global longitudinal strain is normal. The left ventricular internal cavity size was normal in size. There is no left ventricular hypertrophy. Left ventricular diastolic parameters are consistent with Grade I diastolic dysfunction (impaired relaxation). Indeterminate filling pressures. Right Ventricle: The right ventricular size is normal. No increase in right ventricular wall thickness. Right ventricular systolic function is normal. Tricuspid regurgitation signal is inadequate for assessing PA pressure. Left Atrium: Left atrial size was normal in size. Right Atrium: Right atrial size was normal in size.  Pericardium: There is no evidence of pericardial effusion. Mitral Valve: The mitral valve is grossly normal. No evidence of mitral valve regurgitation. Tricuspid Valve: The tricuspid valve is grossly normal. Tricuspid valve regurgitation is not demonstrated. Aortic Valve: The aortic valve is tricuspid. Aortic valve regurgitation is not visualized. Pulmonic Valve: The pulmonic valve was normal in structure. Pulmonic valve regurgitation is not visualized. Aorta: The aortic root and ascending aorta are structurally normal, with no evidence of dilitation. Venous: The inferior vena cava is normal in size with greater than 50% respiratory variability, suggesting right atrial pressure of 3 mmHg. IAS/Shunts: No atrial level shunt detected by color flow Doppler.  LEFT VENTRICLE PLAX 2D LVIDd:         3.50 cm   Diastology LVIDs:         2.10 cm   LV e' medial:    4.82 cm/s LV PW:         0.90 cm   LV E/e' medial:  10.4 LV IVS:        1.10 cm   LV e' lateral:   5.34 cm/s LVOT diam:     1.90 cm   LV E/e' lateral: 9.4 LV SV:         58 LV SV Index:   28        2D Longitudinal Strain LVOT Area:     2.84 cm  2D Strain GLS (A2C):   -  21.4 %                          2D Strain GLS (A3C):   -20.2 %                          2D Strain GLS (A4C):   -23.6 %                          2D Strain GLS Avg:     -21.7 % RIGHT VENTRICLE RV S prime:     12.70 cm/s TAPSE (M-mode): 1.5 cm LEFT ATRIUM             Index        RIGHT ATRIUM          Index LA diam:        2.80 cm 1.37 cm/m   RA Area:     9.69 cm LA Vol (A2C):   32.5 ml 15.90 ml/m  RA Volume:   16.70 ml 8.17 ml/m LA Vol (A4C):   31.9 ml 15.60 ml/m LA Biplane Vol: 32.3 ml 15.80 ml/m  AORTIC VALVE LVOT Vmax:   104.00 cm/s LVOT Vmean:  69.800 cm/s LVOT VTI:    0.205 m  AORTA Ao Root diam: 3.30 cm Ao Asc diam:  3.40 cm MITRAL VALVE MV Area (PHT): 2.21 cm    SHUNTS MV Decel Time: 344 msec    Systemic VTI:  0.20 m MV E velocity: 50.10 cm/s  Systemic Diam: 1.90 cm MV A velocity: 82.70  cm/s MV E/A ratio:  0.61 Lyman Bishop MD Electronically signed by Lyman Bishop MD Signature Date/Time: 06/17/2022/3:45:06 PM    Final    [name] CORE BIOPSY (LYMPH NODES)  Result Date: 06/10/2022 INDICATION: Nonpainful right neck mass.  No history of primary malignancy EXAM: ULTRASOUND GUIDED CORE BIOPSY OF RIGHT NECK MASS MEDICATIONS: Lidocaine 1% subcutaneous ANESTHESIA/SEDATION: Intravenous Fentanyl 137mg and Versed 212mwere administered as conscious sedation during continuous monitoring of the patient's level of consciousness and physiological / cardiorespiratory status by the radiology RN, with a total moderate sedation time of 12 minutes. PROCEDURE: The procedure, risks, benefits, and alternatives were explained to the patient. Questions regarding the procedure were encouraged and answered. The patient understands and consents to the procedure. Survey ultrasound of the right neck performed. Hypoechoic adenopathy was localized. An appropriate skin site was determined. The operative field was prepped with chlorhexidine in a sterile fashion, and a sterile drape was applied covering the operative field. A sterile gown and sterile gloves were used for the procedure. Local anesthesia was provided with 1% Lidocaine. Under real-time ultrasound guidance, multiple 18 gauge core biopsy samples were obtained with a not of a needle, submitted to surgical pathology. COMPLICATIONS: None immediate. FINDINGS: Hypoechoic right cervical adenopathy localized. Representative core biopsy samples obtained as above. IMPRESSION: 1. Technically successful ultrasound-guided core biopsy, right neck mass. Electronically Signed   By: D Lucrezia Europe.D.   On: 06/10/2022 14:41     ASSESSMENT & PLAN:  TrAriyannah Paulings a [age].o. female with   1.  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 Plan -Results of excisional lymph node biopsy showing peripheral T-cell lymphoma not otherwise specified which is CD30 negative  were discussed in detail with the patient. PET CT scan was previously reviewed and she appears to have at least stage II disease but depending on the  liver lesion etiology this could represent stage IV disease. She has had anthracycline exposure as a part of her AC regimen for previous treatment of breast cancer and is therefore limited in her total lifetime anthracycline use. CD 30 negative status precludes use of Adcentris. We recommended proceeding with treatment with CEOP and depending on response after 2-3 cycles we could consider using a dose reduced version of anthracyclines with Zinecard for cardiac protection. -We will start the patient on short burst of prednisone to help with decreasing some of her lymph node related pain in the neck pending start of chemotherapy. -Port-A-Cath placed Echo shows normal ejection fraction. -We will need to be mindful of any cytopenias that might arise given patient has had heavy previous treatment with chemotherapy for both her breast cancer as well as anal squamous cell carcinoma. We will dose reduce etoposide and Cytoxan for her first cycle of treatment and adjust accordingly. -Will need G-CSF support with treatment -We will plan to repeat PET CT scan after 3 cycles of treatment -Chemo therapy orders placed and coordinated.  2. Anal Squamous Cell Carcinoma, cT2N0M0-currently in remission.  Details as noted above   3. 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 Completed tamoxifen 09/2007 - 09/2012 -2008: stage pT2N0 left breast invasive ductal carcinoma, triple negative, grade 3; s/p lumpectomy in 07/2007 and bilateral mastectomy 11/2007 (path negative for residual carcinoma in both breasts); s/p adjuvant chemo AC-T and radiation  Plan:  -Per pt her Genetics Testing was negative.  -Answered patient questions in details about her chemotherapy.  -Discussed her today's lab  results with the patient. Her CBC were normal. Her potassium is slightly low.   FOLLOW UP: RTC with Dr Irene Limbo with portflush and labs in 10-12 days for toxicity check  The total time spent in the appointment was 30 minutes* .  All of the patient's questions were answered with apparent satisfaction. The patient knows to call the clinic with any problems, questions or concerns.   Zettie Cooley, am acting as a scribe for Sullivan Lone, MD.  Sullivan Lone MD [place] AAHIVMS [facility] Hematology/Oncology Physician [facility]  .*Total Encounter Time as defined by the Centers for Medicare and Medicaid Services includes, in addition to the face-to-face time of a patient visit (documented in the note above) non-face-to-face time: obtaining and reviewing outside history, ordering and reviewing medications, tests or procedures, care coordination (communications with other health care professionals or caregivers) and documentation in the medical record.

## 2022-07-08 NOTE — Patient Instructions (Signed)
[facility] ONCOLOGY  Discharge Instructions: Thank you for choosing [facility] to provide your oncology and hematology care.   If you have a lab appointment with the [facility], please go directly to the [facility] and check in at the registration area.   Wear comfortable clothing and clothing appropriate for easy access to any Portacath or PICC line.   We strive to give you quality time with your provider. You may need to reschedule your appointment if you arrive late (15 or more minutes).  Arriving late affects you and other patients whose appointments are after yours.  Also, if you miss three or more appointments without notifying the office, you may be dismissed from the clinic at the provider's discretion.      For prescription refill requests, have your pharmacy contact our office and allow 72 hours for refills to be completed.    Today you received the following chemotherapy and/or immunotherapy agents: Vincristine (Oncovin)/Cyclophosphamide (Cytoxan)/Etoposide      To help prevent nausea and vomiting after your treatment, we encourage you to take your nausea medication as directed.  BELOW ARE SYMPTOMS THAT SHOULD BE REPORTED IMMEDIATELY: *FEVER GREATER THAN 100.4 F (38 C) OR HIGHER *CHILLS OR SWEATING *NAUSEA AND VOMITING THAT IS NOT CONTROLLED WITH YOUR NAUSEA MEDICATION *UNUSUAL SHORTNESS OF BREATH *UNUSUAL BRUISING OR BLEEDING *URINARY PROBLEMS (pain or burning when urinating, or frequent urination) *BOWEL PROBLEMS (unusual diarrhea, constipation, pain near the anus) TENDERNESS IN MOUTH AND THROAT WITH OR WITHOUT PRESENCE OF ULCERS (sore throat, sores in mouth, or a toothache) UNUSUAL RASH, SWELLING OR PAIN  UNUSUAL VAGINAL DISCHARGE OR ITCHING   Items with * indicate a potential emergency and should be followed up as soon as possible or go to the Emergency Department if any problems should occur.  Please show the CHEMOTHERAPY ALERT  CARD or IMMUNOTHERAPY ALERT CARD at check-in to the Emergency Department and triage nurse.  Should you have questions after your visit or need to cancel or reschedule your appointment, please contact [facility]  Dept: [phone]  and follow the prompts.  Office hours are 8:00 a.m. to 4:30 p.m. Monday - Friday. Please note that voicemails left after 4:00 p.m. may not be returned until the following business day.  We are closed weekends and major holidays. You have access to a nurse at all times for urgent questions. Please call the main number to the clinic Dept: [phone] and follow the prompts.   For any non-urgent questions, you may also contact your provider using MyChart. We now offer e-Visits for anyone 9 and older to request care online for non-urgent symptoms. For details visit [email or web address].   Also download the MyChart app! Go to the app store, search "MyChart", open the app, select , and log in with your MyChart username and password.  Masks are optional in the cancer centers. If you would like for your care team to wear a mask while they are taking care of you, please let them know. You may have one support person who is at least 71 years old accompany you for your appointments.

## 2022-07-09 ENCOUNTER — Telehealth: Payer: Self-pay

## 2022-07-09 ENCOUNTER — Inpatient Hospital Stay: Payer: PPO

## 2022-07-09 VITALS — BP 174/90 | HR 69 | Temp 98.4°F | Resp 18

## 2022-07-09 DIAGNOSIS — Z5111 Encounter for antineoplastic chemotherapy: Secondary | ICD-10-CM | POA: Diagnosis not present

## 2022-07-09 DIAGNOSIS — C8448 Peripheral T-cell lymphoma, not classified, lymph nodes of multiple sites: Secondary | ICD-10-CM

## 2022-07-09 MED ORDER — SODIUM CHLORIDE 0.9% FLUSH
10.0000 mL | INTRAVENOUS | Status: DC | PRN
  Administered 2022-07-09: 10 mL

## 2022-07-09 MED ORDER — SODIUM CHLORIDE 0.9 % IV SOLN
80.0000 mg/m2 | Freq: Once | INTRAVENOUS | Status: AC
  Administered 2022-07-09: 170 mg via INTRAVENOUS
  Filled 2022-07-09: qty 8.5

## 2022-07-09 MED ORDER — SODIUM CHLORIDE 0.9 % IV SOLN
10.0000 mg | Freq: Once | INTRAVENOUS | Status: AC
  Administered 2022-07-09: 10 mg via INTRAVENOUS
  Filled 2022-07-09: qty 10

## 2022-07-09 MED ORDER — HEPARIN SOD (PORK) LOCK FLUSH 100 UNIT/ML IV SOLN
500.0000 [IU] | Freq: Once | INTRAVENOUS | Status: AC | PRN
  Administered 2022-07-09: 500 [IU]

## 2022-07-09 MED ORDER — SODIUM CHLORIDE 0.9 % IV SOLN: Freq: Once | INTRAVENOUS | Status: AC

## 2022-07-09 MED FILL — Dexamethasone Sodium Phosphate Inj 100 MG/10ML: INTRAMUSCULAR | Qty: 1 | Status: AC

## 2022-07-09 NOTE — Telephone Encounter (Signed)
LM for patient that this nurse was calling to see how they were doing after their treatment. Please call back to Dr. Kale's nurse at [phone] if they have any questions or concerns regarding the treatment. 

## 2022-07-09 NOTE — Patient Instructions (Signed)
[facility]   Discharge Instructions: Thank you for choosing [facility] to provide your oncology and hematology care.   If you have a lab appointment with the Cancer Center, please go directly to the Cancer Center and check in at the registration area.   Wear comfortable clothing and clothing appropriate for easy access to any Portacath or PICC line.   We strive to give you quality time with your provider. You may need to reschedule your appointment if you arrive late (15 or more minutes).  Arriving late affects you and other patients whose appointments are after yours.  Also, if you miss three or more appointments without notifying the office, you may be dismissed from the clinic at the provider's discretion.      For prescription refill requests, have your pharmacy contact our office and allow 72 hours for refills to be completed.    Today you received the following chemotherapy and/or immunotherapy agents: etoposide      To help prevent nausea and vomiting after your treatment, we encourage you to take your nausea medication as directed.  BELOW ARE SYMPTOMS THAT SHOULD BE REPORTED IMMEDIATELY: *FEVER GREATER THAN 100.4 F (38 C) OR HIGHER *CHILLS OR SWEATING *NAUSEA AND VOMITING THAT IS NOT CONTROLLED WITH YOUR NAUSEA MEDICATION *UNUSUAL SHORTNESS OF BREATH *UNUSUAL BRUISING OR BLEEDING *URINARY PROBLEMS (pain or burning when urinating, or frequent urination) *BOWEL PROBLEMS (unusual diarrhea, constipation, pain near the anus) TENDERNESS IN MOUTH AND THROAT WITH OR WITHOUT PRESENCE OF ULCERS (sore throat, sores in mouth, or a toothache) UNUSUAL RASH, SWELLING OR PAIN  UNUSUAL VAGINAL DISCHARGE OR ITCHING   Items with * indicate a potential emergency and should be followed up as soon as possible or go to the Emergency Department if any problems should occur.  Please show the CHEMOTHERAPY ALERT CARD or IMMUNOTHERAPY ALERT CARD at check-in  to the Emergency Department and triage nurse.  Should you have questions after your visit or need to cancel or reschedule your appointment, please contact [facility]  Dept: [phone]  and follow the prompts.  Office hours are 8:00 a.m. to 4:30 p.m. Monday - Friday. Please note that voicemails left after 4:00 p.m. may not be returned until the following business day.  We are closed weekends and major holidays. You have access to a nurse at all times for urgent questions. Please call the main number to the clinic Dept: [phone] and follow the prompts.   For any non-urgent questions, you may also contact your provider using MyChart. We now offer e-Visits for anyone 18 and older to request care online for non-urgent symptoms. For details visit [email or web address].   Also download the MyChart app! Go to the app store, search "MyChart", open the app, select [facility], and log in with your MyChart username and password.  Masks are optional in the cancer centers. If you would like for your care team to wear a mask while they are taking care of you, please let them know. You may have one support person who is at least 71 years old accompany you for your appointments. 

## 2022-07-09 NOTE — Telephone Encounter (Signed)
-----   Message from Willis Modena, RN sent at 07/08/2022  4:05 PM EDT ----- Regarding: Dr. Irene Limbo 1st time Vincristine/Cytoxan/Etoposide f/u Pt tol well Dr. Irene Limbo 1st time Vincristine/Cytoxan/Etoposide f/u. Pt tolerated tx well without incident. Pt call back due.

## 2022-07-10 ENCOUNTER — Other Ambulatory Visit: Payer: Self-pay

## 2022-07-10 ENCOUNTER — Inpatient Hospital Stay: Payer: PPO

## 2022-07-10 VITALS — BP 151/91 | HR 58 | Temp 98.2°F | Resp 18

## 2022-07-10 DIAGNOSIS — C8448 Peripheral T-cell lymphoma, not classified, lymph nodes of multiple sites: Secondary | ICD-10-CM

## 2022-07-10 DIAGNOSIS — Z5111 Encounter for antineoplastic chemotherapy: Secondary | ICD-10-CM | POA: Diagnosis not present

## 2022-07-10 MED ORDER — SODIUM CHLORIDE 0.9 % IV SOLN
80.0000 mg/m2 | Freq: Once | INTRAVENOUS | Status: AC
  Administered 2022-07-10: 170 mg via INTRAVENOUS
  Filled 2022-07-10: qty 8.5

## 2022-07-10 MED ORDER — SODIUM CHLORIDE 0.9 % IV SOLN
10.0000 mg | Freq: Once | INTRAVENOUS | Status: AC
  Administered 2022-07-10: 10 mg via INTRAVENOUS
  Filled 2022-07-10: qty 10

## 2022-07-10 MED ORDER — SODIUM CHLORIDE 0.9 % IV SOLN: Freq: Once | INTRAVENOUS | Status: AC

## 2022-07-10 MED ORDER — SODIUM CHLORIDE 0.9% FLUSH
10.0000 mL | INTRAVENOUS | Status: DC | PRN
  Administered 2022-07-10: 10 mL

## 2022-07-10 MED ORDER — HEPARIN SOD (PORK) LOCK FLUSH 100 UNIT/ML IV SOLN
500.0000 [IU] | Freq: Once | INTRAVENOUS | Status: AC | PRN
  Administered 2022-07-10: 500 [IU]

## 2022-07-10 NOTE — Patient Instructions (Addendum)
[facility]   Discharge Instructions: Thank you for choosing [facility] to provide your oncology and hematology care.   If you have a lab appointment with the Cancer Center, please go directly to the Cancer Center and check in at the registration area.   Wear comfortable clothing and clothing appropriate for easy access to any Portacath or PICC line.   We strive to give you quality time with your provider. You may need to reschedule your appointment if you arrive late (15 or more minutes).  Arriving late affects you and other patients whose appointments are after yours.  Also, if you miss three or more appointments without notifying the office, you may be dismissed from the clinic at the provider's discretion.      For prescription refill requests, have your pharmacy contact our office and allow 72 hours for refills to be completed.    Today you received the following chemotherapy and/or immunotherapy agents: etoposide      To help prevent nausea and vomiting after your treatment, we encourage you to take your nausea medication as directed.  BELOW ARE SYMPTOMS THAT SHOULD BE REPORTED IMMEDIATELY: *FEVER GREATER THAN 100.4 F (38 C) OR HIGHER *CHILLS OR SWEATING *NAUSEA AND VOMITING THAT IS NOT CONTROLLED WITH YOUR NAUSEA MEDICATION *UNUSUAL SHORTNESS OF BREATH *UNUSUAL BRUISING OR BLEEDING *URINARY PROBLEMS (pain or burning when urinating, or frequent urination) *BOWEL PROBLEMS (unusual diarrhea, constipation, pain near the anus) TENDERNESS IN MOUTH AND THROAT WITH OR WITHOUT PRESENCE OF ULCERS (sore throat, sores in mouth, or a toothache) UNUSUAL RASH, SWELLING OR PAIN  UNUSUAL VAGINAL DISCHARGE OR ITCHING   Items with * indicate a potential emergency and should be followed up as soon as possible or go to the Emergency Department if any problems should occur.  Please show the CHEMOTHERAPY ALERT CARD or IMMUNOTHERAPY ALERT CARD at check-in  to the Emergency Department and triage nurse.  Should you have questions after your visit or need to cancel or reschedule your appointment, please contact [facility]  Dept: [phone]  and follow the prompts.  Office hours are 8:00 a.m. to 4:30 p.m. Monday - Friday. Please note that voicemails left after 4:00 p.m. may not be returned until the following business day.  We are closed weekends and major holidays. You have access to a nurse at all times for urgent questions. Please call the main number to the clinic Dept: [phone] and follow the prompts.   For any non-urgent questions, you may also contact your provider using MyChart. We now offer e-Visits for anyone 18 and older to request care online for non-urgent symptoms. For details visit [email or web address].   Also download the MyChart app! Go to the app store, search "MyChart", open the app, select [facility], and log in with your MyChart username and password.  Masks are optional in the cancer centers. If you would like for your care team to wear a mask while they are taking care of you, please let them know. You may have one support person who is at least 71 years old accompany you for your appointments. 

## 2022-07-12 ENCOUNTER — Inpatient Hospital Stay: Payer: PPO

## 2022-07-12 VITALS — BP 147/81 | HR 61 | Temp 98.1°F | Resp 18

## 2022-07-12 DIAGNOSIS — C8448 Peripheral T-cell lymphoma, not classified, lymph nodes of multiple sites: Secondary | ICD-10-CM

## 2022-07-12 DIAGNOSIS — Z5111 Encounter for antineoplastic chemotherapy: Secondary | ICD-10-CM | POA: Diagnosis not present

## 2022-07-12 MED ORDER — PEGFILGRASTIM-CBQV 6 MG/0.6ML ~~LOC~~ SOSY
6.0000 mg | PREFILLED_SYRINGE | Freq: Once | SUBCUTANEOUS | Status: AC
  Administered 2022-07-12: 6 mg via SUBCUTANEOUS
  Filled 2022-07-12: qty 0.6

## 2022-07-12 NOTE — Patient Instructions (Signed)

## 2022-07-15 ENCOUNTER — Other Ambulatory Visit: Payer: Self-pay

## 2022-07-15 ENCOUNTER — Encounter: Payer: Self-pay | Admitting: Hematology

## 2022-07-17 ENCOUNTER — Telehealth: Payer: Self-pay

## 2022-07-17 ENCOUNTER — Other Ambulatory Visit: Payer: Self-pay

## 2022-07-17 DIAGNOSIS — C8448 Peripheral T-cell lymphoma, not classified, lymph nodes of multiple sites: Secondary | ICD-10-CM

## 2022-07-17 MED ORDER — PREDNISONE 20 MG PO TABS: 60.0000 mg | ORAL_TABLET | Freq: Every day | ORAL | 0 refills | Status: DC

## 2022-07-17 NOTE — Telephone Encounter (Signed)
Contacted pt regarding MyChart message. Discussed bowel regimen starting a few days before her chemo tx. Pt did not take oral steroids for 2 days post tx this time so we reviewed that. Pt acknowledged information and  verbalized understanding.

## 2022-07-22 ENCOUNTER — Inpatient Hospital Stay: Payer: PPO

## 2022-07-22 ENCOUNTER — Inpatient Hospital Stay: Payer: PPO | Attending: Hematology | Admitting: Hematology

## 2022-07-22 VITALS — BP 146/89 | HR 75 | Temp 98.0°F | Resp 15 | Wt 216.0 lb

## 2022-07-22 DIAGNOSIS — Z23 Encounter for immunization: Secondary | ICD-10-CM | POA: Diagnosis not present

## 2022-07-22 DIAGNOSIS — D500 Iron deficiency anemia secondary to blood loss (chronic): Secondary | ICD-10-CM

## 2022-07-22 DIAGNOSIS — C8590 Non-Hodgkin lymphoma, unspecified, unspecified site: Secondary | ICD-10-CM | POA: Diagnosis not present

## 2022-07-22 DIAGNOSIS — Z5111 Encounter for antineoplastic chemotherapy: Secondary | ICD-10-CM | POA: Diagnosis not present

## 2022-07-22 DIAGNOSIS — C8448 Peripheral T-cell lymphoma, not classified, lymph nodes of multiple sites: Secondary | ICD-10-CM | POA: Diagnosis not present

## 2022-07-22 DIAGNOSIS — Z79899 Other long term (current) drug therapy: Secondary | ICD-10-CM | POA: Insufficient documentation

## 2022-07-22 DIAGNOSIS — M4727 Other spondylosis with radiculopathy, lumbosacral region: Secondary | ICD-10-CM | POA: Diagnosis not present

## 2022-07-22 DIAGNOSIS — R310 Gross hematuria: Secondary | ICD-10-CM | POA: Diagnosis not present

## 2022-07-22 DIAGNOSIS — Z6836 Body mass index (BMI) 36.0-36.9, adult: Secondary | ICD-10-CM | POA: Diagnosis not present

## 2022-07-22 DIAGNOSIS — E7800 Pure hypercholesterolemia, unspecified: Secondary | ICD-10-CM | POA: Diagnosis not present

## 2022-07-22 DIAGNOSIS — Z5189 Encounter for other specified aftercare: Secondary | ICD-10-CM | POA: Diagnosis not present

## 2022-07-22 DIAGNOSIS — Z95828 Presence of other vascular implants and grafts: Secondary | ICD-10-CM

## 2022-07-22 DIAGNOSIS — I10 Essential (primary) hypertension: Secondary | ICD-10-CM | POA: Diagnosis not present

## 2022-07-22 LAB — CBC WITH DIFFERENTIAL (CANCER CENTER ONLY)
Abs Immature Granulocytes: 0.08 10*3/uL — ABNORMAL HIGH (ref 0.00–0.07)
Basophils Absolute: 0 10*3/uL (ref 0.0–0.1)
Basophils Relative: 1 %
Eosinophils Absolute: 0.1 10*3/uL (ref 0.0–0.5)
Eosinophils Relative: 2 %
HCT: 34.6 % — ABNORMAL LOW (ref 36.0–46.0)
Hemoglobin: 11.3 g/dL — ABNORMAL LOW (ref 12.0–15.0)
Immature Granulocytes: 2 %
Lymphocytes Relative: 18 %
Lymphs Abs: 1 10*3/uL (ref 0.7–4.0)
MCH: 27.8 pg (ref 26.0–34.0)
MCHC: 32.7 g/dL (ref 30.0–36.0)
MCV: 85 fL (ref 80.0–100.0)
Monocytes Absolute: 0.6 10*3/uL (ref 0.1–1.0)
Monocytes Relative: 10 %
Neutro Abs: 3.7 10*3/uL (ref 1.7–7.7)
Neutrophils Relative %: 67 %
Platelet Count: 179 10*3/uL (ref 150–400)
RBC: 4.07 MIL/uL (ref 3.87–5.11)
RDW: 14.6 % (ref 11.5–15.5)
WBC Count: 5.4 10*3/uL (ref 4.0–10.5)
nRBC: 0 % (ref 0.0–0.2)

## 2022-07-22 LAB — CMP (CANCER CENTER ONLY)
ALT: 14 U/L (ref 0–44)
AST: 12 U/L — ABNORMAL LOW (ref 15–41)
Albumin: 3.5 g/dL (ref 3.5–5.0)
Alkaline Phosphatase: 99 U/L (ref 38–126)
Anion gap: 4 — ABNORMAL LOW (ref 5–15)
BUN: 9 mg/dL (ref 8–23)
CO2: 29 mmol/L (ref 22–32)
Calcium: 9 mg/dL (ref 8.9–10.3)
Chloride: 108 mmol/L (ref 98–111)
Creatinine: 0.92 mg/dL (ref 0.44–1.00)
GFR, Estimated: >60 mL/min (ref >60)
Glucose, Bld: 108 mg/dL — ABNORMAL HIGH (ref 70–99)
Potassium: 3.8 mmol/L (ref 3.5–5.1)
Sodium: 141 mmol/L (ref 135–145)
Total Bilirubin: 0.3 mg/dL (ref 0.3–1.2)
Total Protein: 6.3 g/dL — ABNORMAL LOW (ref 6.5–8.1)

## 2022-07-22 LAB — URIC ACID: Uric Acid, Serum: 4.3 mg/dL (ref 2.5–7.1)

## 2022-07-22 LAB — FERRITIN: Ferritin: 100 ng/mL (ref 11–307)

## 2022-07-22 LAB — LACTATE DEHYDROGENASE: LDH: 167 U/L (ref 98–192)

## 2022-07-22 MED ORDER — SODIUM CHLORIDE 0.9% FLUSH
10.0000 mL | INTRAVENOUS | Status: AC | PRN
  Administered 2022-07-22: 10 mL

## 2022-07-22 MED ORDER — HEPARIN SOD (PORK) LOCK FLUSH 100 UNIT/ML IV SOLN
500.0000 [IU] | INTRAVENOUS | Status: AC | PRN
  Administered 2022-07-22: 500 [IU]

## 2022-07-22 NOTE — Progress Notes (Signed)
[place]   [phone] Fax:([phone]) [place] VISIT   Patient Care Team: Kathyrn Lass, MD as PCP - General (Family Medicine) Brunetta Genera, MD as PCP - Hematology/Oncology (Hematology) Wonda Horner, MD as Consulting Physician (Gastroenterology) Truitt Merle, MD as Consulting Physician (Hematology) Alla Feeling, NP as Nurse Practitioner (Nurse Practitioner) Kyung Rudd, MD as Consulting Physician (Radiation Oncology) Michael Boston, MD as Consulting Physician (General Surgery) Izora Gala, MD as Attending Physician (Otolaryngology)  Date of Service: 07/22/22   CHIEF COMPLAINT: f/u of newly diagnosed T cell lymphoma  SUMMARY OF ONCOLOGIC HISTORY: Oncology History Overview Note  Cancer Staging Anal cancer [facility]) Staging form: Anus, AJCC 8th Edition - Clinical stage from 06/28/2019: Stage IIA (cT2, cN0, cM0) - Signed by Alla Feeling, NP on 06/28/2019  Breast cancer of upper-outer quadrant of left female breast [facility]) Staging form: Breast, AJCC 7th Edition - Clinical stage from 09/07/2013: Stage IIA (T2, N0, cM0) - Signed by [name], MD on 09/07/2013 - Pathologic: Stage IIA (T2, N0, cM0) - Signed by [name], MD on 09/07/2013  Breast cancer of upper-outer quadrant of right female breast [facility]) Staging form: Breast, AJCC 7th Edition - Clinical stage from 09/07/2013: Stage IA (T1a, N0, cM0) - Signed by [name], MD on 09/07/2013 - Pathologic: Stage IA (T1a, N0, cM0) - Signed by [name], MD on 09/07/2013    Breast cancer of upper-outer quadrant of left female breast ([place])  07/10/2007 Procedure   US biopsy showed invasive ductal carcinoma 0.8cm, Nottingham grade 3   07/30/2007 Surgery   She had left breast lumpectomy and LN biopsy which showed high grade invasive ductal cancer Nottingham grade 3, 2.7 cm, ER/PR/Her2 neu negative   11/24/2007 Surgery   Patient elected for bilateral mastectomy    09/07/2008 - 03/08/2009 Chemotherapy   dates are approximate: she received adriamycin and cytoxan followed by Taxol   03/08/2009 - 05/08/2009 Radiation Therapy   dates are approximate, she received XRT    07/09/2021 Imaging   CT CAP  IMPRESSION: 1. No evidence to suggest locally recurrent ianorectal neoplasm or metastatic disease in the chest, abdomen or pelvis. 2. Status post bilateral modified radical mastectomy and bilateral axillary lymph node dissection. 3. Three small supraumbilical ventral hernias containing only omental fat. No associated bowel incarceration or obstruction at this time. 4. Aortic atherosclerosis. 5. Additional incidental findings, as above.   Breast cancer of upper-outer quadrant of right female breast ([place])  07/10/2006 Procedure   stereotactic biopsy showed atypical hyperplasia   10/23/2006 Surgery   right lumpectomy showed invasive ductal ca (Nottingham grade 1)  65m and DCIS, ER/PR positive Her 2 negative   10/09/2007 - 10/08/2012 Chemotherapy   Patient was placed on Tamoxifen   03/24/2017 Imaging   No acute findings. No evidence of recurrent carcinoma or metastatic disease   07/09/2021 Imaging   CT CAP  IMPRESSION: 1. No evidence to suggest locally recurrent ianorectal neoplasm or metastatic disease in the chest, abdomen or pelvis. 2. Status post bilateral modified radical mastectomy and bilateral axillary lymph node dissection. 3. Three small supraumbilical ventral hernias containing only omental fat. No associated bowel incarceration or obstruction at this time. 4. Aortic atherosclerosis. 5. Additional incidental findings, as above.   Anal cancer ([place]  06/04/2019 Procedure   Colonoscopy per Dr. SAnson Fret Findings-the digital rectal exam revealed a 3 cm diameter firm rectal mass.  The mass was noncircumferential and located predominantly at the right bowel  wall at the anorectal junction. A nonobstructing mass was found at the anus and in  the rectum    06/04/2019 Initial Biopsy   Follow pathology: Large intestine, rectum biopsy: Invasive well to moderately differentiated squamous cell carcinoma.  No rectal mucosa present.  There is strong diffuse expression of P 16 immunostain.  CDX 2, p63 and mCEA immunostains are also used in the diagnostic work-up of the case.   06/04/2019 Initial Diagnosis   Anal cancer ([place])   06/21/2019 PET scan   IMPRESSION: 1. Anorectal primary. No hypermetabolic metastatic disease within the chest, abdomen, or pelvis. Perirectal nodes, at least 1 of which is new since 02/26/2018 CT, suspicious based on size and interval development. 2. Mild limitations secondary to beam hardening artifact from bilateral hip arthroplasty. 3.  Aortic Atherosclerosis (ICD10-I70.0).   06/28/2019 Cancer Staging   Staging form: Anus, AJCC 8th Edition - Clinical stage from 06/28/2019: Stage IIA (cT2, cN0, cM0) - Signed by Alla Feeling, NP on 06/28/2019   06/28/2019 - 07/26/2019 Chemotherapy   concurrent chemoRT with Mitomycin and 5FU on week 1 and week 5 on starting 06/28/19. Last dose on 07/26/19   06/28/2019 - 08/09/2019 Radiation Therapy   concurrent chemoRT with Dr [name] 06/28/19-08/09/19   11/01/2019 PET scan   IMPRESSION: 1. Marked interval decrease in hypermetabolism noted at the level of the anal rectal primary. No evidence for hypermetabolic metastatic disease in the chest, abdomen, or pelvis. The perirectal lymph nodes identified previously have resolved in the interval. 2.  Aortic Atherosclerois (ICD10-170.0)   08/07/2020 Imaging   CT CAP  IMPRESSION: 1. No findings for residual/recurrent anal tumor, regional lymphadenopathy or metastatic disease. 2. Status post cholecystectomy. No biliary dilatation. 3. Small anterior abdominal wall hernia containing fat. 4. Aortic atherosclerosis.   Aortic Atherosclerosis (ICD10-I70.0).     07/09/2021 Imaging   CT CAP  IMPRESSION: 1. No evidence to  suggest locally recurrent ianorectal neoplasm or metastatic disease in the chest, abdomen or pelvis. 2. Status post bilateral modified radical mastectomy and bilateral axillary lymph node dissection. 3. Three small supraumbilical ventral hernias containing only omental fat. No associated bowel incarceration or obstruction at this time. 4. Aortic atherosclerosis. 5. Additional incidental findings, as above.   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 for evaluation and management of newly diagnosed T-cell non-Hodgkin's lymphoma.  Patient has a history of bilateral breast cancer status post mastectomies.  -2007: s/p right lumpectomy for ADH in 08/2006 showed DCIS ER/PR+, grade 1. 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 She was adopted, no known family history    She subsequently was diagnosed with anal squamous cell carcinoma cT2 N0 M0 diagnosed in September 2020.Workup showed a 3 cm mass at the anorectal junction, biopsy confirmed well to moderately differentiated squamous cell carcinoma. 06/21/19 PET scan showed no metastasis.  -S/p concurrent chemoRT with Mitomycin and 5FU on 08/09/19. Now on surveillance. -surveillance CT CAP 07/09/21 was NED. -  surveillance colonoscopy done for rectal bleeding on 12/07/21 showed only radiation changes. Her rectal bleeding has resolved.  Recently patient presented to her primary care physician on 05/23/2022  with her new right neck lump which has been progressively enlarging.  She also subsequently developed a left neck swelling.  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  She has had a PET CT scan done on 06/19/2022 which showed hypermetabolic bilateral neck lymphadenopathy.  Also noted to have a new 1 cm focus of metabolic activity in segment 4 of the liver.  This is etiology indeterminate. Lumbar scoliosis and small supraumbilical hernia.  Patient has had Port-A-Cath placement done on 06/25/2022. Echocardiogram was done on 06/17/2022 and shows normal ejection fraction of 70 to 75% with grade 1 diastolic dysfunction.  Right ventricular systolic function within normal limits.  INTERVAL HISTORY Nikita Surman is here today for evaluation and management of newly diagnosed T-cell non-Hodgkin's lymphoma. She is here for toxicity check after her cycle 1 of CEOP. She will be starting her cycle 2 of CEOP on 07/29/2022.  She was seen by me on 07/08/2022 and she complained of right neck soreness, constipation, diarrhea, and night sweat.   She complains of muscle cramps, mostly legs and hands, during today's visit. She states her muscle cramps begin near her knee which radiates to her ankles. She is currently taking Crestor 40 mg. She denies of taking any Vitamin-D supplements.   She states she has been eating bananas to improve her potassium levels.    She denies uncontrol nausea or any other toxicities from cycle 1 of CHEOP. She notes she had mouth soreness, but it has been better now. She denies any new bumps/lumps.    ROS 10 Point review of Systems was done is negative except as noted above.  MEDICAL HISTORY:  Past Medical History:  Diagnosis Date   Anal cancer ([place]) dx'd 05/2019   Anxiety    Arthritis    Bilateral breast cancer ([place]) 10/21/2013   Breast cancer ([place])    Cancer ([place])     Depression    Gallstones    GERD (gastroesophageal reflux disease)    doesn't take any meds for this   Headache    h/o migraines       History of bladder infections    History of hiatal hernia    History of migraine    Hyperlipidemia    takes Simvastatin daily   Hypertension    takes Hyzaar   Joint pain    Joint swelling    Lumbar stenosis    Neuropathy 09/07/2013   Pneumonia    Pre-diabetes     SURGICAL HISTORY: Past Surgical History:  Procedure Laterality Date   ABDOMINAL HYSTERECTOMY     bone spur removed from left foot     CHOLECYSTECTOMY     COLONOSCOPY     COLONOSCOPY WITH PROPOFOL Bilateral 12/07/2021   Procedure: COLONOSCOPY WITH PROPOFOL;  Surgeon: Clarene Essex, MD;  Location: [facility];  Service: Endoscopy;  Laterality: Bilateral;   double mastectomy      ESOPHAGOGASTRODUODENOSCOPY N/A 12/07/2021   Procedure: ESOPHAGOGASTRODUODENOSCOPY (EGD);  Surgeon: Clarene Essex, MD;  Location: [facility];  Service: Endoscopy;  Laterality: N/A;   HERNIA REPAIR     umbilical   IR IMAGING GUIDED PORT INSERTION  06/25/2022   right knee arthroscopy     right shoulder arthroscopy     surgery for  hiatal hernia     TONSILLECTOMY     TOTAL HIP ARTHROPLASTY Left 02/12/2013   TOTAL HIP ARTHROPLASTY Left 02/12/2013   Procedure: LEFT TOTAL HIP ARTHROPLASTY;  Surgeon: Kerin Salen, MD;  Location: [place];  Service: Orthopedics;  Laterality: Left;   TOTAL HIP ARTHROPLASTY Right 05/03/2013   Procedure: TOTAL HIP ARTHROPLASTY;  Surgeon: Kerin Salen, MD;  Location: [place];  Service: Orthopedics;  Laterality: Right;   TOTAL KNEE ARTHROPLASTY Right 02/27/2015   Procedure: TOTAL KNEE ARTHROPLASTY;  Surgeon: Frederik Pear, MD;  Location: [place];  Service: Orthopedics;  Laterality: Right;   TOTAL SHOULDER ARTHROPLASTY Right 09/12/2016   Procedure: RIGHT TOTAL SHOULDER ARTHROPLASTY;  Surgeon: Tania Ade, MD;  Location: [place];  Service: Orthopedics;  Laterality: Right;  RIGHT TOTAL SHOULDER  ARTHROPLASTY    I have reviewed the social history and family history with the patient and they are unchanged from previous note.  ALLERGIES:  is allergic to oxycodone-acetaminophen, tramadol, codeine, and vicodin [hydrocodone-acetaminophen].  MEDICATIONS:  Current Outpatient Medications  Medication Sig Dispense Refill   acyclovir (ZOVIRAX) 400 MG tablet Take 1 tablet (400 mg total) by mouth daily. 30 tablet 3   aspirin-acetaminophen-caffeine (EXCEDRIN MIGRAINE) 741-638-45 MG tablet Take 2 tablets by mouth every 6 (six) hours as needed for headache.     HYDROcodone-acetaminophen (NORCO) 5-325 MG tablet Take 1 tablet by mouth every 6 (six) hours as needed for moderate pain. 30 tablet 0   lidocaine-prilocaine (EMLA) cream Apply to affected area once 30 g 3   LINZESS 72 MCG capsule Take 72 mcg by mouth daily as needed (constipation).     loratadine (CLARITIN) 10 MG tablet Take 1 tablet (10 mg total) by mouth daily. 30 tablet 0   losartan (COZAAR) 100 MG tablet Take 100 mg by mouth daily.  6   meclizine (ANTIVERT) 12.5 MG tablet Take 1 tablet (12.5 mg total) by mouth 3 (three) times daily as needed for dizziness. 30 tablet 0   Menthol-Methyl Salicylate (SALONPAS PAIN RELIEF PATCH EX) Apply 1 patch topically daily as needed (pain).     ondansetron (ZOFRAN) 8 MG tablet Take 1 tablet (8 mg total) by mouth every 8 (eight) hours as needed for nausea or vomiting. Start on the third day after cyclophosphamide chemotherapy. 30 tablet 1   predniSONE (DELTASONE) 20 MG tablet Take 3 tablets (60 mg total) by mouth daily. Take for 2 days starting the day after chemotherapy on day 4 30 tablet 0   prochlorperazine (COMPAZINE) 10 MG tablet Take 1 tablet (10 mg total) by mouth every 6 (six) hours as needed for nausea or vomiting. 30 tablet 6   rosuvastatin (CRESTOR) 40 MG tablet Take 40 mg by mouth daily.     senna-docusate (SENNA S) 8.6-50 MG tablet Take 2 tablets by mouth at bedtime. 60 tablet 1   No current  facility-administered medications for this visit.    PHYSICAL EXAMINATION: Telemedicine visit  LABORATORY DATA:  I have reviewed the data as listed    Latest Ref Rng & Units 07/08/2022   10:41 AM 06/13/2022    1:18 PM 06/10/2022   11:30 AM  CBC  WBC 4.0 - 10.5 K/uL 5.0  3.6  3.7   Hemoglobin 12.0 - 15.0 g/dL 12.1  13.3  13.4   Hematocrit 36.0 - 46.0 % 36.5  40.3  42.5   Platelets 150 - 400 K/uL 198  198  203         Latest Ref Rng & Units  07/08/2022   10:41 AM 06/13/2022    1:18 PM 06/10/2022   11:30 AM  CMP  Glucose 70 - 99 mg/dL 102  97  93   BUN 8 - 23 mg/dL _0 Creatinine 0.44 - 1.00 mg/dL 1.04  1.02  0.76   Sodium 135 - 145 mmol/L 142  143  141   Potassium 3.5 - 5.1 mmol/L 3.3  3.9  3.2   Chloride 98 - 111 mmol/L 109  106  109   CO2 22 - 32 mmol/L _1 Calcium 8.9 - 10.3 mg/dL 8.7  9.2  9.5   Total Protein 6.5 - 8.1 g/dL 6.2  7.1    Total Bilirubin 0.3 - 1.2 mg/dL 0.5  0.4    Alkaline Phos 38 - 126 U/L 85  91    AST 15 - 41 U/L 16  16    ALT 0 - 44 U/L 22  11            RADIOGRAPHIC STUDIES: .IR IMAGING GUIDED PORT INSERTION  Result Date: 06/25/2022 CLINICAL DATA:  T-cell lymphoma and need for porta cath for chemotherapy. EXAM: IMPLANTED PORT A CATH PLACEMENT WITH ULTRASOUND AND FLUOROSCOPIC GUIDANCE ANESTHESIA/SEDATION: Moderate (conscious) sedation was employed during this procedure. A total of Versed 2.0 mg and Fentanyl 100 mcg was administered intravenously by radiology nursing. Moderate Sedation Time: 37 minutes. The patient's level of consciousness and vital signs were monitored continuously by radiology nursing throughout the procedure under my direct supervision. FLUOROSCOPY: 30 seconds.  3.0 mGy. PROCEDURE: The procedure, risks, benefits, and alternatives were explained to the patient. Questions regarding the procedure were encouraged and answered. The patient understands and consents to the procedure. A time-out was performed prior to  initiating the procedure. Ultrasound was utilized to confirm patency of the right internal jugular vein. A permanent ultrasound image was recorded. The right neck and chest were prepped with chlorhexidine in a sterile fashion, and a sterile drape was applied covering the operative field. Maximum barrier sterile technique with sterile gowns and gloves were used for the procedure. Local anesthesia was provided with 1% lidocaine. After creating a small venotomy incision, a 21 gauge needle was advanced into the right internal jugular vein under direct, real-time ultrasound guidance. Ultrasound image documentation was performed. After securing guidewire access, an 8 Fr dilator was placed. A J-wire was kinked to measure appropriate catheter length. A subcutaneous port pocket was then created along the upper chest wall utilizing sharp and blunt dissection. Portable cautery was utilized. The pocket was irrigated with sterile saline. A single lumen power injectable port was chosen for placement. The 8 Fr catheter was tunneled from the port pocket site to the venotomy incision. The port was placed in the pocket. External catheter was trimmed to appropriate length based on guidewire measurement. At the venotomy, an 8 Fr peel-away sheath was placed over a guidewire. The catheter was then placed through the sheath and the sheath removed. Final catheter positioning was confirmed and documented with a fluoroscopic spot image. The port was accessed with a needle and aspirated and flushed with heparinized saline. The access needle was removed. The venotomy and port pocket incisions were closed with subcutaneous 3-0 Monocryl and subcuticular 4-0 Vicryl. Dermabond was applied to both incisions. COMPLICATIONS: COMPLICATIONS None FINDINGS: After catheter placement, the tip lies at the cavo-atrial junction. The catheter aspirates normally and is ready for immediate use. IMPRESSION: Placement of single lumen port a cath  via right internal  jugular vein. The catheter tip lies at the cavo-atrial junction. A power injectable port a cath was placed and is ready for immediate use. Electronically Signed   By: Aletta Edouard M.D.   On: 06/25/2022 17:22     ASSESSMENT & PLAN:  Cynthia Velasquez is a 71 y.o. female with   1.  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 Plan -Results of excisional lymph node biopsy showing peripheral T-cell lymphoma not otherwise specified which is CD30 negative were discussed in detail with the patient. PET CT scan was previously reviewed and she appears to have at least stage II disease but depending on the liver lesion etiology this could represent stage IV disease. She has had anthracycline exposure as a part of her AC regimen for previous treatment of breast cancer and is therefore limited in her total lifetime anthracycline use. CD 30 negative status precludes use of Adcentris. We recommended proceeding with treatment with CEOP and depending on response after 2-3 cycles we could consider using a dose reduced version of anthracyclines with Zinecard for cardiac protection. -We will start the patient on short burst of prednisone to help with decreasing some of her lymph node related pain in the neck pending start of chemotherapy. -Port-A-Cath placed Echo shows normal ejection fraction. -We will need to be mindful of any cytopenias that might arise given patient has had heavy previous treatment with chemotherapy for both her breast cancer as well as anal squamous cell carcinoma. We will dose reduce etoposide and Cytoxan for her first cycle of treatment and adjust accordingly. -Will need G-CSF support with treatment -We will plan to repeat PET CT scan after 3 cycles of treatment -Chemo therapy orders placed and coordinated.  2. Anal Squamous Cell Carcinoma, cT2N0M0-currently in remission.  Details as noted above   3. H/o bilateral breast cancer, s/p mastectomies  -2007: s/p right  lumpectomy for ADH in 08/2006 showed DCIS ER/PR+, grade 1. Completed tamoxifen 09/2007 - 09/2012 -2008: stage pT2N0 left breast invasive ductal carcinoma, triple negative, grade 3; s/p lumpectomy in 07/2007 and bilateral mastectomy 11/2007 (path negative for residual carcinoma in both breasts); s/p adjuvant chemo AC-T and radiation  Plan:  -Labs done today were discussed with the patient. Her WBC count is 5.4K, hemoglobin is 11.3K, and platelets is 179. Her potassium is still low.  -Answered patient questions in details about her chemotherapy.  -Recommended to start 2,000 units dauly Vitamin-D supplement.  -Recommended to talk to her PCP and stop Crestor, which could be causing her muscle cramps.  -PET scan in about 2 weeks.  -patient appriopriate to proceed with C2 of CEOP per prders   FOLLOW UP: Pet/ct in 2 weeks Plz schedule C2 and C3 of CEOP per integrated scheduling orders  The total time spent in the appointment was 30 minutes* .  All of the patient's questions were answered with apparent satisfaction. The patient knows to call the clinic with any problems, questions or concerns.   Zettie Cooley, am acting as a scribe for Sullivan Lone, MD.  Sullivan Lone MD [place] AAHIVMS [facility] Hematology/Oncology Physician [facility]  .*Total Encounter Time as defined by the Centers for Medicare and Medicaid Services includes, in addition to the face-to-face time of a patient visit (documented in the note above) non-face-to-face time: obtaining and reviewing outside history, ordering and reviewing medications, tests or procedures, care coordination (communications with other health care professionals or caregivers) and documentation in the medical record.

## 2022-07-26 ENCOUNTER — Other Ambulatory Visit: Payer: Self-pay

## 2022-07-26 MED FILL — Fosaprepitant Dimeglumine For IV Infusion 150 MG (Base Eq): INTRAVENOUS | Qty: 5 | Status: AC

## 2022-07-26 MED FILL — Dexamethasone Sodium Phosphate Inj 100 MG/10ML: INTRAMUSCULAR | Qty: 1 | Status: AC

## 2022-07-29 ENCOUNTER — Encounter: Payer: Self-pay | Admitting: Hematology

## 2022-07-29 ENCOUNTER — Inpatient Hospital Stay: Payer: PPO

## 2022-07-29 VITALS — BP 150/89 | HR 83 | Temp 98.4°F | Resp 17 | Wt 214.2 lb

## 2022-07-29 DIAGNOSIS — D500 Iron deficiency anemia secondary to blood loss (chronic): Secondary | ICD-10-CM

## 2022-07-29 DIAGNOSIS — Z5111 Encounter for antineoplastic chemotherapy: Secondary | ICD-10-CM | POA: Diagnosis not present

## 2022-07-29 DIAGNOSIS — C8448 Peripheral T-cell lymphoma, not classified, lymph nodes of multiple sites: Secondary | ICD-10-CM

## 2022-07-29 DIAGNOSIS — Z95828 Presence of other vascular implants and grafts: Secondary | ICD-10-CM

## 2022-07-29 LAB — CMP (CANCER CENTER ONLY)
ALT: 11 U/L (ref 0–44)
AST: 14 U/L — ABNORMAL LOW (ref 15–41)
Albumin: 3.8 g/dL (ref 3.5–5.0)
Alkaline Phosphatase: 80 U/L (ref 38–126)
Anion gap: 6 (ref 5–15)
BUN: 13 mg/dL (ref 8–23)
CO2: 27 mmol/L (ref 22–32)
Calcium: 9 mg/dL (ref 8.9–10.3)
Chloride: 109 mmol/L (ref 98–111)
Creatinine: 1 mg/dL (ref 0.44–1.00)
GFR, Estimated: >60 mL/min (ref >60)
Glucose, Bld: 88 mg/dL (ref 70–99)
Potassium: 3.4 mmol/L — ABNORMAL LOW (ref 3.5–5.1)
Sodium: 142 mmol/L (ref 135–145)
Total Bilirubin: 0.3 mg/dL (ref 0.3–1.2)
Total Protein: 6.9 g/dL (ref 6.5–8.1)

## 2022-07-29 LAB — CBC WITH DIFFERENTIAL (CANCER CENTER ONLY)
Abs Immature Granulocytes: 0.01 10*3/uL (ref 0.00–0.07)
Basophils Absolute: 0 10*3/uL (ref 0.0–0.1)
Basophils Relative: 1 %
Eosinophils Absolute: 0 10*3/uL (ref 0.0–0.5)
Eosinophils Relative: 1 %
HCT: 35.1 % — ABNORMAL LOW (ref 36.0–46.0)
Hemoglobin: 11.4 g/dL — ABNORMAL LOW (ref 12.0–15.0)
Immature Granulocytes: 0 %
Lymphocytes Relative: 20 %
Lymphs Abs: 0.8 10*3/uL (ref 0.7–4.0)
MCH: 27.8 pg (ref 26.0–34.0)
MCHC: 32.5 g/dL (ref 30.0–36.0)
MCV: 85.6 fL (ref 80.0–100.0)
Monocytes Absolute: 0.6 10*3/uL (ref 0.1–1.0)
Monocytes Relative: 15 %
Neutro Abs: 2.4 10*3/uL (ref 1.7–7.7)
Neutrophils Relative %: 63 %
Platelet Count: 269 10*3/uL (ref 150–400)
RBC: 4.1 MIL/uL (ref 3.87–5.11)
RDW: 15.2 % (ref 11.5–15.5)
WBC Count: 3.8 10*3/uL — ABNORMAL LOW (ref 4.0–10.5)
nRBC: 0 % (ref 0.0–0.2)

## 2022-07-29 LAB — FERRITIN: Ferritin: 81 ng/mL (ref 11–307)

## 2022-07-29 LAB — LACTATE DEHYDROGENASE: LDH: 181 U/L (ref 98–192)

## 2022-07-29 MED ORDER — SODIUM CHLORIDE 0.9 % IV SOLN
150.0000 mg | Freq: Once | INTRAVENOUS | Status: AC
  Administered 2022-07-29: 150 mg via INTRAVENOUS
  Filled 2022-07-29: qty 150

## 2022-07-29 MED ORDER — SODIUM CHLORIDE 0.9 % IV SOLN: Freq: Once | INTRAVENOUS | Status: AC

## 2022-07-29 MED ORDER — PALONOSETRON HCL INJECTION 0.25 MG/5ML
0.2500 mg | Freq: Once | INTRAVENOUS | Status: AC
  Administered 2022-07-29: 0.25 mg via INTRAVENOUS
  Filled 2022-07-29: qty 5

## 2022-07-29 MED ORDER — SODIUM CHLORIDE 0.9 % IV SOLN
10.0000 mg | Freq: Once | INTRAVENOUS | Status: AC
  Administered 2022-07-29: 10 mg via INTRAVENOUS
  Filled 2022-07-29: qty 10

## 2022-07-29 MED ORDER — HEPARIN SOD (PORK) LOCK FLUSH 100 UNIT/ML IV SOLN
500.0000 [IU] | Freq: Once | INTRAVENOUS | Status: AC | PRN
  Administered 2022-07-29: 500 [IU]

## 2022-07-29 MED ORDER — SODIUM CHLORIDE 0.9 % IV SOLN
600.0000 mg/m2 | Freq: Once | INTRAVENOUS | Status: AC
  Administered 2022-07-29: 1280 mg via INTRAVENOUS
  Filled 2022-07-29: qty 64

## 2022-07-29 MED ORDER — ACETAMINOPHEN 325 MG PO TABS
650.0000 mg | ORAL_TABLET | Freq: Once | ORAL | Status: AC
  Administered 2022-07-29: 650 mg via ORAL
  Filled 2022-07-29: qty 2

## 2022-07-29 MED ORDER — SODIUM CHLORIDE 0.9% FLUSH
10.0000 mL | INTRAVENOUS | Status: AC | PRN
  Administered 2022-07-29: 10 mL

## 2022-07-29 MED ORDER — SODIUM CHLORIDE 0.9 % IV SOLN
80.0000 mg/m2 | Freq: Once | INTRAVENOUS | Status: AC
  Administered 2022-07-29: 170 mg via INTRAVENOUS
  Filled 2022-07-29: qty 8.5

## 2022-07-29 MED ORDER — FAMOTIDINE IN NACL 20-0.9 MG/50ML-% IV SOLN
20.0000 mg | Freq: Once | INTRAVENOUS | Status: AC
  Administered 2022-07-29: 20 mg via INTRAVENOUS
  Filled 2022-07-29: qty 50

## 2022-07-29 MED ORDER — VINCRISTINE SULFATE CHEMO INJECTION 1 MG/ML
2.0000 mg | Freq: Once | INTRAVENOUS | Status: AC
  Administered 2022-07-29: 2 mg via INTRAVENOUS
  Filled 2022-07-29: qty 2

## 2022-07-29 MED ORDER — SODIUM CHLORIDE 0.9% FLUSH
10.0000 mL | INTRAVENOUS | Status: DC | PRN
  Administered 2022-07-29: 10 mL

## 2022-07-29 MED FILL — Dexamethasone Sodium Phosphate Inj 100 MG/10ML: INTRAMUSCULAR | Qty: 1 | Status: AC

## 2022-07-29 NOTE — Patient Instructions (Addendum)
[facility]  Discharge Instructions: Thank you for choosing [facility] to provide your oncology and hematology care.   If you have a lab appointment with the [facility], please go directly to the [facility] and check in at the registration area.   Wear comfortable clothing and clothing appropriate for easy access to any Portacath or PICC line.   We strive to give you quality time with your provider. You may need to reschedule your appointment if you arrive late (15 or more minutes).  Arriving late affects you and other patients whose appointments are after yours.  Also, if you miss three or more appointments without notifying the office, you may be dismissed from the clinic at the provider's discretion.      For prescription refill requests, have your pharmacy contact our office and allow 72 hours for refills to be completed.    Today you received the following chemotherapy and/or immunotherapy agents: Vincristine, Cyclophosphamide Cytoxan), and Etoposide.   To help prevent nausea and vomiting after your treatment, we encourage you to take your nausea medication as directed.  BELOW ARE SYMPTOMS THAT SHOULD BE REPORTED IMMEDIATELY: *FEVER GREATER THAN 100.4 F (38 C) OR HIGHER *CHILLS OR SWEATING *NAUSEA AND VOMITING THAT IS NOT CONTROLLED WITH YOUR NAUSEA MEDICATION *UNUSUAL SHORTNESS OF BREATH *UNUSUAL BRUISING OR BLEEDING *URINARY PROBLEMS (pain or burning when urinating, or frequent urination) *BOWEL PROBLEMS (unusual diarrhea, constipation, pain near the anus) TENDERNESS IN MOUTH AND THROAT WITH OR WITHOUT PRESENCE OF ULCERS (sore throat, sores in mouth, or a toothache) UNUSUAL RASH, SWELLING OR PAIN  UNUSUAL VAGINAL DISCHARGE OR ITCHING   Items with * indicate a potential emergency and should be followed up as soon as possible or go to the Emergency Department if any problems should occur.  Please show the CHEMOTHERAPY ALERT CARD  or IMMUNOTHERAPY ALERT CARD at check-in to the Emergency Department and triage nurse.  Should you have questions after your visit or need to cancel or reschedule your appointment, please contact [facility]  Dept: [phone]  and follow the prompts.  Office hours are 8:00 a.m. to 4:30 p.m. Monday - Friday. Please note that voicemails left after 4:00 p.m. may not be returned until the following business day.  We are closed weekends and major holidays. You have access to a nurse at all times for urgent questions. Please call the main number to the clinic Dept: [phone] and follow the prompts.   For any non-urgent questions, you may also contact your provider using MyChart. We now offer e-Visits for anyone 47 and older to request care online for non-urgent symptoms. For details visit [email or web address].   Also download the MyChart app! Go to the app store, search "MyChart", open the app, select [facility], and log in with your MyChart username and password.  Masks are optional in the cancer centers. If you would like for your care team to wear a mask while they are taking care of you, please let them know. You may have one support person who is at least 71 years old accompany you for your appointments.  Hypokalemia Hypokalemia means that the amount of potassium in the blood is lower than normal. Potassium is a mineral (electrolyte) that helps regulate the amount of fluid in the body. It also stimulates muscle tightening (contraction) and helps nerves work properly. Normally, most of the body's potassium is inside cells, and only a very small amount is in the blood.  Because the amount in the blood is so small, minor changes to potassium levels in the blood can be life-threatening. What are the causes? This condition may be caused by: Antibiotic medicine. Diarrhea or vomiting. Taking too much of a medicine that helps you have a bowel movement (laxative) can cause  diarrhea and lead to hypokalemia. Chronic kidney disease (CKD). Medicines that help the body get rid of excess fluid (diuretics). Eating disorders, such as anorexia or bulimia. Low magnesium levels in the body. Sweating a lot. What are the signs or symptoms? Symptoms of this condition include: Weakness. Constipation. Fatigue. Muscle cramps. Mental confusion. Skipped heartbeats or irregular heartbeat (palpitations). Tingling or numbness. How is this diagnosed? This condition is diagnosed with a blood test. How is this treated? This condition may be treated by: Taking potassium supplements. Adjusting the medicines that you take. Eating more foods that contain a lot of potassium. If your potassium level is very low, you may need to get potassium through an IV and be monitored in the hospital. Follow these instructions at home: Eating and drinking  Eat a healthy diet. A healthy diet includes fresh fruits and vegetables, whole grains, healthy fats, and lean proteins. If told, eat more foods that contain a lot of potassium. These include: Nuts, such as peanuts and pistachios. Seeds, such as sunflower seeds and pumpkin seeds. Peas, lentils, and lima beans. Whole grain and bran cereals and breads. Fresh fruits and vegetables, such as apricots, avocado, bananas, cantaloupe, kiwi, oranges, tomatoes, asparagus, and potatoes. Juices, such as orange, tomato, and prune. Lean meats, including fish. Milk and milk products, such as yogurt. General instructions Take over-the-counter and prescription medicines only as told by your health care provider. This includes vitamins, natural food products, and supplements. Keep all follow-up visits. This is important. Contact a health care provider if: You have weakness that gets worse. You feel your heart pounding or racing. You vomit. You have diarrhea. You have diabetes and you have trouble keeping your blood sugar in your target range. Get help  right away if: You have chest pain. You have shortness of breath. You have vomiting or diarrhea that lasts for more than 2 days. You faint. These symptoms may be an emergency. Get help right away. Call 911. Do not wait to see if the symptoms will go away. Do not drive yourself to the hospital. Summary Hypokalemia means that the amount of potassium in the blood is lower than normal. This condition is diagnosed with a blood test. Hypokalemia may be treated by taking potassium supplements, adjusting the medicines that you take, or eating more foods that are high in potassium. If your potassium level is very low, you may need to get potassium through an IV and be monitored in the hospital. This information is not intended to replace advice given to you by your health care provider. Make sure you discuss any questions you have with your health care provider. Document Revised: 05/17/2021 Document Reviewed: 05/17/2021 Elsevier Patient Education  [employer].

## 2022-07-30 ENCOUNTER — Encounter: Payer: Self-pay | Admitting: Hematology

## 2022-07-30 ENCOUNTER — Inpatient Hospital Stay: Payer: PPO

## 2022-07-30 VITALS — BP 148/92 | HR 80 | Temp 98.2°F | Resp 16

## 2022-07-30 DIAGNOSIS — Z5111 Encounter for antineoplastic chemotherapy: Secondary | ICD-10-CM | POA: Diagnosis not present

## 2022-07-30 DIAGNOSIS — C8448 Peripheral T-cell lymphoma, not classified, lymph nodes of multiple sites: Secondary | ICD-10-CM

## 2022-07-30 MED ORDER — SODIUM CHLORIDE 0.9% FLUSH
10.0000 mL | INTRAVENOUS | Status: DC | PRN
  Administered 2022-07-30: 10 mL

## 2022-07-30 MED ORDER — SODIUM CHLORIDE 0.9 % IV SOLN
10.0000 mg | Freq: Once | INTRAVENOUS | Status: AC
  Administered 2022-07-30: 10 mg via INTRAVENOUS
  Filled 2022-07-30: qty 10

## 2022-07-30 MED ORDER — SODIUM CHLORIDE 0.9 % IV SOLN: Freq: Once | INTRAVENOUS | Status: AC

## 2022-07-30 MED ORDER — HEPARIN SOD (PORK) LOCK FLUSH 100 UNIT/ML IV SOLN
500.0000 [IU] | Freq: Once | INTRAVENOUS | Status: AC | PRN
  Administered 2022-07-30: 500 [IU]

## 2022-07-30 MED ORDER — SODIUM CHLORIDE 0.9 % IV SOLN
80.0000 mg/m2 | Freq: Once | INTRAVENOUS | Status: AC
  Administered 2022-07-30: 170 mg via INTRAVENOUS
  Filled 2022-07-30: qty 8.5

## 2022-07-30 MED FILL — Dexamethasone Sodium Phosphate Inj 100 MG/10ML: INTRAMUSCULAR | Qty: 1 | Status: AC

## 2022-07-30 NOTE — Patient Instructions (Signed)
[facility]  Discharge Instructions: Thank you for choosing [facility] to provide your oncology and hematology care.   If you have a lab appointment with the Cancer Center, please go directly to the Cancer Center and check in at the registration area.   Wear comfortable clothing and clothing appropriate for easy access to any Portacath or PICC line.   We strive to give you quality time with your provider. You may need to reschedule your appointment if you arrive late (15 or more minutes).  Arriving late affects you and other patients whose appointments are after yours.  Also, if you miss three or more appointments without notifying the office, you may be dismissed from the clinic at the provider's discretion.      For prescription refill requests, have your pharmacy contact our office and allow 72 hours for refills to be completed.    Today you received the following chemotherapy and/or immunotherapy agents: Etoposide      To help prevent nausea and vomiting after your treatment, we encourage you to take your nausea medication as directed.  BELOW ARE SYMPTOMS THAT SHOULD BE REPORTED IMMEDIATELY: *FEVER GREATER THAN 100.4 F (38 C) OR HIGHER *CHILLS OR SWEATING *NAUSEA AND VOMITING THAT IS NOT CONTROLLED WITH YOUR NAUSEA MEDICATION *UNUSUAL SHORTNESS OF BREATH *UNUSUAL BRUISING OR BLEEDING *URINARY PROBLEMS (pain or burning when urinating, or frequent urination) *BOWEL PROBLEMS (unusual diarrhea, constipation, pain near the anus) TENDERNESS IN MOUTH AND THROAT WITH OR WITHOUT PRESENCE OF ULCERS (sore throat, sores in mouth, or a toothache) UNUSUAL RASH, SWELLING OR PAIN  UNUSUAL VAGINAL DISCHARGE OR ITCHING   Items with * indicate a potential emergency and should be followed up as soon as possible or go to the Emergency Department if any problems should occur.  Please show the CHEMOTHERAPY ALERT CARD or IMMUNOTHERAPY ALERT CARD at check-in to  the Emergency Department and triage nurse.  Should you have questions after your visit or need to cancel or reschedule your appointment, please contact [facility]  Dept: [phone]  and follow the prompts.  Office hours are 8:00 a.m. to 4:30 p.m. Monday - Friday. Please note that voicemails left after 4:00 p.m. may not be returned until the following business day.  We are closed weekends and major holidays. You have access to a nurse at all times for urgent questions. Please call the main number to the clinic Dept: [phone] and follow the prompts.   For any non-urgent questions, you may also contact your provider using MyChart. We now offer e-Visits for anyone 18 and older to request care online for non-urgent symptoms. For details visit [email or web address].   Also download the MyChart app! Go to the app store, search "MyChart", open the app, select [facility], and log in with your MyChart username and password.  Masks are optional in the cancer centers. If you would like for your care team to wear a mask while they are taking care of you, please let them know. You may have one support person who is at least 71 years old accompany you for your appointments. 

## 2022-07-31 ENCOUNTER — Other Ambulatory Visit: Payer: Self-pay | Admitting: *Deleted

## 2022-07-31 ENCOUNTER — Telehealth: Payer: Self-pay

## 2022-07-31 ENCOUNTER — Other Ambulatory Visit: Payer: Self-pay

## 2022-07-31 ENCOUNTER — Encounter: Payer: Self-pay | Admitting: Hematology

## 2022-07-31 ENCOUNTER — Inpatient Hospital Stay: Payer: PPO

## 2022-07-31 VITALS — BP 155/86 | HR 64 | Temp 98.1°F | Resp 16

## 2022-07-31 DIAGNOSIS — C8448 Peripheral T-cell lymphoma, not classified, lymph nodes of multiple sites: Secondary | ICD-10-CM

## 2022-07-31 DIAGNOSIS — R7881 Bacteremia: Secondary | ICD-10-CM

## 2022-07-31 DIAGNOSIS — Z5111 Encounter for antineoplastic chemotherapy: Secondary | ICD-10-CM | POA: Diagnosis not present

## 2022-07-31 LAB — URINALYSIS, COMPLETE (UACMP) WITH MICROSCOPIC
Bilirubin Urine: NEGATIVE
Glucose, UA: NEGATIVE mg/dL
Ketones, ur: NEGATIVE mg/dL
Nitrite: POSITIVE — AB
Protein, ur: NEGATIVE mg/dL
Specific Gravity, Urine: 1.016 (ref 1.005–1.030)
pH: 5 (ref 5.0–8.0)

## 2022-07-31 MED ORDER — SODIUM CHLORIDE 0.9% FLUSH
10.0000 mL | INTRAVENOUS | Status: DC | PRN
  Administered 2022-07-31: 10 mL

## 2022-07-31 MED ORDER — HEPARIN SOD (PORK) LOCK FLUSH 100 UNIT/ML IV SOLN
500.0000 [IU] | Freq: Once | INTRAVENOUS | Status: AC | PRN
  Administered 2022-07-31: 500 [IU]

## 2022-07-31 MED ORDER — SODIUM CHLORIDE 0.9 % IV SOLN: Freq: Once | INTRAVENOUS | Status: AC

## 2022-07-31 MED ORDER — SODIUM CHLORIDE 0.9 % IV SOLN
80.0000 mg/m2 | Freq: Once | INTRAVENOUS | Status: AC
  Administered 2022-07-31: 170 mg via INTRAVENOUS
  Filled 2022-07-31: qty 8.5

## 2022-07-31 MED ORDER — SODIUM CHLORIDE 0.9 % IV SOLN
10.0000 mg | Freq: Once | INTRAVENOUS | Status: AC
  Administered 2022-07-31: 10 mg via INTRAVENOUS
  Filled 2022-07-31: qty 10

## 2022-07-31 NOTE — Telephone Encounter (Signed)
Contacted pt regarding my chart message. Discussed urine output since pt had gotten up this am. Pt states color is still pink and it did look like she had some bright red blood with first am void. Pt states it is just pink and clear now. Pt denies any pain and states she feels like she is emptying her bladder with each void. Gave information to Dr Irene Limbo and orders placed for u/a u/c when pt arrives for tx today.

## 2022-07-31 NOTE — Patient Instructions (Signed)
[facility]  Discharge Instructions: Thank you for choosing [facility] to provide your oncology and hematology care.   If you have a lab appointment with the Cancer Center, please go directly to the Cancer Center and check in at the registration area.   Wear comfortable clothing and clothing appropriate for easy access to any Portacath or PICC line.   We strive to give you quality time with your provider. You may need to reschedule your appointment if you arrive late (15 or more minutes).  Arriving late affects you and other patients whose appointments are after yours.  Also, if you miss three or more appointments without notifying the office, you may be dismissed from the clinic at the provider's discretion.      For prescription refill requests, have your pharmacy contact our office and allow 72 hours for refills to be completed.    Today you received the following chemotherapy and/or immunotherapy agents: Etoposide      To help prevent nausea and vomiting after your treatment, we encourage you to take your nausea medication as directed.  BELOW ARE SYMPTOMS THAT SHOULD BE REPORTED IMMEDIATELY: *FEVER GREATER THAN 100.4 F (38 C) OR HIGHER *CHILLS OR SWEATING *NAUSEA AND VOMITING THAT IS NOT CONTROLLED WITH YOUR NAUSEA MEDICATION *UNUSUAL SHORTNESS OF BREATH *UNUSUAL BRUISING OR BLEEDING *URINARY PROBLEMS (pain or burning when urinating, or frequent urination) *BOWEL PROBLEMS (unusual diarrhea, constipation, pain near the anus) TENDERNESS IN MOUTH AND THROAT WITH OR WITHOUT PRESENCE OF ULCERS (sore throat, sores in mouth, or a toothache) UNUSUAL RASH, SWELLING OR PAIN  UNUSUAL VAGINAL DISCHARGE OR ITCHING   Items with * indicate a potential emergency and should be followed up as soon as possible or go to the Emergency Department if any problems should occur.  Please show the CHEMOTHERAPY ALERT CARD or IMMUNOTHERAPY ALERT CARD at check-in to  the Emergency Department and triage nurse.  Should you have questions after your visit or need to cancel or reschedule your appointment, please contact [facility]  Dept: [phone]  and follow the prompts.  Office hours are 8:00 a.m. to 4:30 p.m. Monday - Friday. Please note that voicemails left after 4:00 p.m. may not be returned until the following business day.  We are closed weekends and major holidays. You have access to a nurse at all times for urgent questions. Please call the main number to the clinic Dept: [phone] and follow the prompts.   For any non-urgent questions, you may also contact your provider using MyChart. We now offer e-Visits for anyone 18 and older to request care online for non-urgent symptoms. For details visit [email or web address].   Also download the MyChart app! Go to the app store, search "MyChart", open the app, select [facility], and log in with your MyChart username and password.  Masks are optional in the cancer centers. If you would like for your care team to wear a mask while they are taking care of you, please let them know. You may have one support person who is at least 71 years old accompany you for your appointments. 

## 2022-08-01 ENCOUNTER — Other Ambulatory Visit: Payer: Self-pay | Admitting: Hematology

## 2022-08-01 ENCOUNTER — Encounter: Payer: Self-pay | Admitting: Hematology

## 2022-08-01 MED ORDER — CEFPODOXIME PROXETIL 100 MG PO TABS: 100.0000 mg | ORAL_TABLET | Freq: Two times a day (BID) | ORAL | 0 refills | Status: AC

## 2022-08-01 NOTE — Progress Notes (Signed)
UTI noted on UA --prescription for Vantin sent to pharmacy.

## 2022-08-01 NOTE — Progress Notes (Signed)
Contacted pt to let her know that she has a UTI and that Dr Irene Limbo has called in an oral antibiotic for her. Instructed pt on how to take med. Pt acknowledged information and verbalized understanding.

## 2022-08-02 ENCOUNTER — Encounter: Payer: Self-pay | Admitting: Hematology

## 2022-08-02 ENCOUNTER — Other Ambulatory Visit: Payer: Self-pay

## 2022-08-02 ENCOUNTER — Inpatient Hospital Stay: Payer: PPO

## 2022-08-02 VITALS — BP 149/82 | HR 82 | Temp 98.6°F | Resp 18

## 2022-08-02 DIAGNOSIS — C8448 Peripheral T-cell lymphoma, not classified, lymph nodes of multiple sites: Secondary | ICD-10-CM

## 2022-08-02 DIAGNOSIS — Z5111 Encounter for antineoplastic chemotherapy: Secondary | ICD-10-CM | POA: Diagnosis not present

## 2022-08-02 MED ORDER — PEGFILGRASTIM-CBQV 6 MG/0.6ML ~~LOC~~ SOSY
6.0000 mg | PREFILLED_SYRINGE | Freq: Once | SUBCUTANEOUS | Status: AC
  Administered 2022-08-02: 6 mg via SUBCUTANEOUS
  Filled 2022-08-02: qty 0.6

## 2022-08-02 MED ORDER — SULFAMETHOXAZOLE-TRIMETHOPRIM 800-160 MG PO TABS: 1.0000 | ORAL_TABLET | Freq: Two times a day (BID) | ORAL | 0 refills | Status: AC

## 2022-08-03 LAB — URINE CULTURE: Culture: >=100000 — AB

## 2022-08-05 ENCOUNTER — Encounter: Payer: Self-pay | Admitting: Hematology

## 2022-08-06 ENCOUNTER — Telehealth: Payer: Self-pay | Admitting: *Deleted

## 2022-08-06 ENCOUNTER — Encounter: Payer: Self-pay | Admitting: Hematology

## 2022-08-06 NOTE — Telephone Encounter (Signed)
Contacted patient regarding Mychart messages about UTI and prod cough. She has been taking Bactrim DS for 3 days. Experiencing abdominal cramping intermittently. Has been waking at night between 3-4 am and goes to bathroom. Urine bloody with white foam, but bleeding is only at night. Pictures attached to messages. Also has persistent prod cough.  After review of messages, Dr. Irene Limbo asked she be offered an appt with [facility] tomorrow for evaluation. Patient in agreement. Appt made.

## 2022-08-07 ENCOUNTER — Emergency Department (HOSPITAL_COMMUNITY): Payer: PPO

## 2022-08-07 ENCOUNTER — Inpatient Hospital Stay (HOSPITAL_BASED_OUTPATIENT_CLINIC_OR_DEPARTMENT_OTHER): Payer: PPO | Admitting: Physician Assistant

## 2022-08-07 ENCOUNTER — Encounter (HOSPITAL_COMMUNITY): Payer: Self-pay | Admitting: Emergency Medicine

## 2022-08-07 ENCOUNTER — Other Ambulatory Visit: Payer: Self-pay

## 2022-08-07 ENCOUNTER — Emergency Department (HOSPITAL_COMMUNITY)
Admission: EM | Admit: 2022-08-07 | Discharge: 2022-08-07 | Disposition: A | Discharge status: Home / Self Care | Payer: PPO | Attending: Emergency Medicine | Admitting: Emergency Medicine

## 2022-08-07 ENCOUNTER — Inpatient Hospital Stay: Payer: PPO

## 2022-08-07 VITALS — BP 103/74 | HR 96 | Temp 98.1°F | Resp 18

## 2022-08-07 DIAGNOSIS — N2889 Other specified disorders of kidney and ureter: Secondary | ICD-10-CM | POA: Diagnosis not present

## 2022-08-07 DIAGNOSIS — Z79899 Other long term (current) drug therapy: Secondary | ICD-10-CM | POA: Insufficient documentation

## 2022-08-07 DIAGNOSIS — R109 Unspecified abdominal pain: Secondary | ICD-10-CM

## 2022-08-07 DIAGNOSIS — R319 Hematuria, unspecified: Secondary | ICD-10-CM | POA: Diagnosis not present

## 2022-08-07 DIAGNOSIS — R1084 Generalized abdominal pain: Secondary | ICD-10-CM | POA: Insufficient documentation

## 2022-08-07 DIAGNOSIS — K625 Hemorrhage of anus and rectum: Secondary | ICD-10-CM

## 2022-08-07 DIAGNOSIS — Z7982 Long term (current) use of aspirin: Secondary | ICD-10-CM | POA: Insufficient documentation

## 2022-08-07 DIAGNOSIS — I10 Essential (primary) hypertension: Secondary | ICD-10-CM | POA: Insufficient documentation

## 2022-08-07 DIAGNOSIS — R110 Nausea: Secondary | ICD-10-CM | POA: Diagnosis not present

## 2022-08-07 DIAGNOSIS — C8448 Peripheral T-cell lymphoma, not classified, lymph nodes of multiple sites: Secondary | ICD-10-CM | POA: Diagnosis not present

## 2022-08-07 DIAGNOSIS — Z853 Personal history of malignant neoplasm of breast: Secondary | ICD-10-CM | POA: Diagnosis not present

## 2022-08-07 DIAGNOSIS — Z85048 Personal history of other malignant neoplasm of rectum, rectosigmoid junction, and anus: Secondary | ICD-10-CM | POA: Insufficient documentation

## 2022-08-07 DIAGNOSIS — R1010 Upper abdominal pain, unspecified: Secondary | ICD-10-CM | POA: Diagnosis not present

## 2022-08-07 DIAGNOSIS — R1013 Epigastric pain: Secondary | ICD-10-CM | POA: Diagnosis present

## 2022-08-07 LAB — CBC WITH DIFFERENTIAL (CANCER CENTER ONLY)
Abs Immature Granulocytes: 0.33 10*3/uL — ABNORMAL HIGH (ref 0.00–0.07)
Basophils Absolute: 0.1 10*3/uL (ref 0.0–0.1)
Basophils Relative: 2 %
Eosinophils Absolute: 0.1 10*3/uL (ref 0.0–0.5)
Eosinophils Relative: 2 %
HCT: 34.9 % — ABNORMAL LOW (ref 36.0–46.0)
Hemoglobin: 11.2 g/dL — ABNORMAL LOW (ref 12.0–15.0)
Immature Granulocytes: 14 %
Lymphocytes Relative: 23 %
Lymphs Abs: 0.5 10*3/uL — ABNORMAL LOW (ref 0.7–4.0)
MCH: 27.5 pg (ref 26.0–34.0)
MCHC: 32.1 g/dL (ref 30.0–36.0)
MCV: 85.5 fL (ref 80.0–100.0)
Monocytes Absolute: 0.4 10*3/uL (ref 0.1–1.0)
Monocytes Relative: 18 %
Neutro Abs: 0.9 10*3/uL — ABNORMAL LOW (ref 1.7–7.7)
Neutrophils Relative %: 41 %
Platelet Count: 134 10*3/uL — ABNORMAL LOW (ref 150–400)
RBC: 4.08 MIL/uL (ref 3.87–5.11)
RDW: 15.2 % (ref 11.5–15.5)
Smear Review: NORMAL
WBC Count: 2.3 10*3/uL — ABNORMAL LOW (ref 4.0–10.5)
nRBC: 0 % (ref 0.0–0.2)

## 2022-08-07 LAB — LIPASE, BLOOD: Lipase: 24 U/L (ref 11–51)

## 2022-08-07 LAB — CMP (CANCER CENTER ONLY)
ALT: 21 U/L (ref 0–44)
AST: 17 U/L (ref 15–41)
Albumin: 3.4 g/dL — ABNORMAL LOW (ref 3.5–5.0)
Alkaline Phosphatase: 81 U/L (ref 38–126)
Anion gap: 8 (ref 5–15)
BUN: 22 mg/dL (ref 8–23)
CO2: 24 mmol/L (ref 22–32)
Calcium: 8.8 mg/dL — ABNORMAL LOW (ref 8.9–10.3)
Chloride: 108 mmol/L (ref 98–111)
Creatinine: 1.16 mg/dL — ABNORMAL HIGH (ref 0.44–1.00)
GFR, Estimated: 51 mL/min — ABNORMAL LOW (ref >60)
Glucose, Bld: 103 mg/dL — ABNORMAL HIGH (ref 70–99)
Potassium: 3.1 mmol/L — ABNORMAL LOW (ref 3.5–5.1)
Sodium: 140 mmol/L (ref 135–145)
Total Bilirubin: 0.5 mg/dL (ref 0.3–1.2)
Total Protein: 6.5 g/dL (ref 6.5–8.1)

## 2022-08-07 LAB — URINALYSIS, COMPLETE (UACMP) WITH MICROSCOPIC
Bacteria, UA: NONE SEEN
Bilirubin Urine: NEGATIVE
Glucose, UA: NEGATIVE mg/dL
Ketones, ur: NEGATIVE mg/dL
Leukocytes,Ua: NEGATIVE
Nitrite: NEGATIVE
Protein, ur: NEGATIVE mg/dL
RBC / HPF: >50 RBC/hpf — ABNORMAL HIGH (ref 0–5)
Specific Gravity, Urine: 1.024 (ref 1.005–1.030)
pH: 5 (ref 5.0–8.0)

## 2022-08-07 LAB — LACTIC ACID, PLASMA: Lactic Acid, Venous: 1.5 mmol/L (ref 0.5–1.9)

## 2022-08-07 LAB — TROPONIN I (HIGH SENSITIVITY): Troponin I (High Sensitivity): 4 ng/L (ref <18)

## 2022-08-07 LAB — MAGNESIUM: Magnesium: 1.7 mg/dL (ref 1.7–2.4)

## 2022-08-07 MED ORDER — METOCLOPRAMIDE HCL 5 MG/ML IJ SOLN: 10.0000 mg | Freq: Once | INTRAMUSCULAR | Status: DC

## 2022-08-07 MED ORDER — HYDROMORPHONE HCL 1 MG/ML IJ SOLN: 0.5000 mg | Freq: Once | INTRAMUSCULAR | Status: DC

## 2022-08-07 MED ORDER — DIPHENHYDRAMINE HCL 50 MG/ML IJ SOLN
12.5000 mg | Freq: Once | INTRAMUSCULAR | Status: AC
  Administered 2022-08-07: 12.5 mg via INTRAVENOUS
  Filled 2022-08-07: qty 1

## 2022-08-07 MED ORDER — HEPARIN SOD (PORK) LOCK FLUSH 100 UNIT/ML IV SOLN
500.0000 [IU] | Freq: Once | INTRAVENOUS | Status: DC
  Filled 2022-08-07: qty 5

## 2022-08-07 MED ORDER — FAMOTIDINE 20 MG PO TABS: 20.0000 mg | ORAL_TABLET | Freq: Two times a day (BID) | ORAL | 0 refills | Status: DC

## 2022-08-07 MED ORDER — HYDROMORPHONE HCL 1 MG/ML IJ SOLN: 1.0000 mg | Freq: Once | INTRAMUSCULAR | Status: DC

## 2022-08-07 MED ORDER — OXYCODONE-ACETAMINOPHEN 5-325 MG PO TABS: 1.0000 | ORAL_TABLET | Freq: Three times a day (TID) | ORAL | 0 refills | Status: AC | PRN

## 2022-08-07 MED ORDER — IOHEXOL 300 MG/ML  SOLN
100.0000 mL | Freq: Once | INTRAMUSCULAR | Status: AC | PRN
  Administered 2022-08-07: 100 mL via INTRAVENOUS

## 2022-08-07 MED ORDER — FAMOTIDINE 20 MG PO TABS
20.0000 mg | ORAL_TABLET | Freq: Once | ORAL | Status: AC
  Administered 2022-08-07: 20 mg via ORAL
  Filled 2022-08-07: qty 1

## 2022-08-07 MED ORDER — LACTATED RINGERS IV BOLUS
1000.0000 mL | Freq: Once | INTRAVENOUS | Status: AC
  Administered 2022-08-07: 1000 mL via INTRAVENOUS

## 2022-08-07 MED ORDER — SODIUM CHLORIDE (PF) 0.9 % IJ SOLN
INTRAMUSCULAR | Status: AC
  Filled 2022-08-07: qty 50

## 2022-08-07 MED ORDER — HYDROMORPHONE HCL 1 MG/ML IJ SOLN
0.5000 mg | Freq: Once | INTRAMUSCULAR | Status: AC
  Administered 2022-08-07: 0.5 mg via INTRAVENOUS
  Filled 2022-08-07: qty 1

## 2022-08-07 MED ORDER — FAMOTIDINE IN NACL 20-0.9 MG/50ML-% IV SOLN: 20.0000 mg | Freq: Once | INTRAVENOUS | Status: DC

## 2022-08-07 MED ORDER — POTASSIUM CHLORIDE CRYS ER 20 MEQ PO TBCR
40.0000 meq | EXTENDED_RELEASE_TABLET | Freq: Once | ORAL | Status: AC
  Administered 2022-08-07: 40 meq via ORAL
  Filled 2022-08-07: qty 2

## 2022-08-07 MED ORDER — DIPHENHYDRAMINE HCL 50 MG/ML IJ SOLN: 12.5000 mg | Freq: Once | INTRAMUSCULAR | Status: DC

## 2022-08-07 MED ORDER — ALUM & MAG HYDROXIDE-SIMETH 200-200-20 MG/5ML PO SUSP
30.0000 mL | Freq: Once | ORAL | Status: AC
  Administered 2022-08-07: 30 mL via ORAL
  Filled 2022-08-07: qty 30

## 2022-08-07 MED ORDER — LIDOCAINE VISCOUS HCL 2 % MT SOLN
15.0000 mL | Freq: Once | OROMUCOSAL | Status: AC
  Administered 2022-08-07: 15 mL via ORAL
  Filled 2022-08-07: qty 15

## 2022-08-07 NOTE — ED Triage Notes (Signed)
Patient from the cancer center due to abdominal pain for 1 week, back pain, blood in urine, rectal bleeding and a cough for 2 weeks. She was last treated for breast cancer, rectal cancer and T cell lymphoma 11/15. She also is currently being treated for a UTI.

## 2022-08-07 NOTE — Progress Notes (Signed)
Symptom Management Consult note [place]    Patient Care Team: Kathyrn Lass, MD as PCP - General (Family Medicine) Brunetta Genera, MD as PCP - Hematology/Oncology (Hematology) Wonda Horner, MD as Consulting Physician (Gastroenterology) Truitt Merle, MD as Consulting Physician (Hematology) Alla Feeling, NP as Nurse Practitioner (Nurse Practitioner) Kyung Rudd, MD as Consulting Physician (Radiation Oncology) Michael Boston, MD as Consulting Physician (General Surgery) Izora Gala, MD as Attending Physician (Otolaryngology)    Name of the [name]  [id number]  13-Feb-1951   Date of visit: 08/07/2022   Chief Complaint/Reason for visit: cough, abdominal pain, back pain, rectal bleeding, blood in urine   Current Therapy: CHOEP q21d  Last treatment:  Day 3   Cycle 2 on 07/31/22   ASSESSMENT & PLAN: Patient is a 71 y.o. female  with oncologic history of breast cancer (2014), anal cancer (2020), currently treated for T cell lymphoma followed by Dr. Irene Limbo.  I have viewed most recent oncology note and lab work.    #) T cell lymphoma - Next appointment with oncologist is 08/20/22   #) Abdominal pain -Exam today with tenderness to epigastric area, LLQ, and suprapubic area with voluntary guarding. -Had BM today, making SBO unlikely. Ddx includes GERD, PUD, colitis, diverticulitis. -CMP showing mild hypokalemia 3.1, no other significant electrolyte derangement, liver enzymes WNL. She does have mild bump in creatinine 1.16 from 1.0 x 9 days ago with baseline around 0.8  Leukopenia -likely 2.2 to chemotherapy. Did receive udenyca injection 10/02/21 for support -WBC today 2.3 w/ ANC 0.9, without fever during course of illness.  #) Gross hematuria -Patiently currently being treated for UTI with Bactrim, has 3 days remaining.  -exam with CVA tenderness -UA today without signs of infection, does have moderate HgB and >50 RBC. ?if this is  hemorraghic cystitis from cyclophosphamide.  #) Rectal bleeding  -History of radiation therapy. -New rectal bleeding x 2 weeks without straining.   #) Cough - Cough x 2 weeks, sick contact with granddaughter.  -Clear lung exam, no hypoxia. No fever, pneumonia unlikely. Suspect viral illness. - Discussed cough suppressant instead of expectorant as well as zyrtec and Flonase   With patient's multiple symptoms and tender abdomen she needs further evaluation to include likely CT AP. Dr. Irene Limbo and patient agreeable with plan. Patient  taken to ED in stable condition. Report given to ED RN.  Heme/Onc History: Oncology History Overview Note  Cancer Staging Anal cancer ([place]) Staging form: Anus, AJCC 8th Edition - Clinical stage from 06/28/2019: Stage IIA (cT2, cN0, cM0) - Signed by Alla Feeling, NP on 06/28/2019  Breast cancer of upper-outer quadrant of left female breast [facility]) Staging form: Breast, AJCC 7th Edition - Clinical stage from 09/07/2013: Stage IIA (T2, N0, cM0) - Signed by [name], MD on 09/07/2013 - Pathologic: Stage IIA (T2, N0, cM0) - Signed by [name], MD on 09/07/2013  Breast cancer of upper-outer quadrant of right female breast [facility]) Staging form: Breast, AJCC 7th Edition - Clinical stage from 09/07/2013: Stage IA (T1a, N0, cM0) - Signed by [name], MD on 09/07/2013 - Pathologic: Stage IA (T1a, N0, cM0) - Signed by [name], MD on 09/07/2013    Breast cancer of upper-outer quadrant of left female breast ([place])  07/10/2007 Procedure   US biopsy showed invasive ductal carcinoma 0.8cm, Nottingham grade 3   07/30/2007 Surgery   She had left breast lumpectomy and LN biopsy which showed high grade invasive  ductal cancer Nottingham grade 3, 2.7 cm, ER/PR/Her2 neu negative   11/24/2007 Surgery   Patient elected for bilateral mastectomy   09/07/2008 - 03/08/2009 Chemotherapy   dates are approximate: she received adriamycin and cytoxan followed by Taxol    03/08/2009 - 05/08/2009 Radiation Therapy   dates are approximate, she received XRT    07/09/2021 Imaging   CT CAP  IMPRESSION: 1. No evidence to suggest locally recurrent ianorectal neoplasm or metastatic disease in the chest, abdomen or pelvis. 2. Status post bilateral modified radical mastectomy and bilateral axillary lymph node dissection. 3. Three small supraumbilical ventral hernias containing only omental fat. No associated bowel incarceration or obstruction at this time. 4. Aortic atherosclerosis. 5. Additional incidental findings, as above.   Breast cancer of upper-outer quadrant of right female breast ([place])  07/10/2006 Procedure   stereotactic biopsy showed atypical hyperplasia   10/23/2006 Surgery   right lumpectomy showed invasive ductal ca (Nottingham grade 1)  98m and DCIS, ER/PR positive Her 2 negative   10/09/2007 - 10/08/2012 Chemotherapy   Patient was placed on Tamoxifen   03/24/2017 Imaging   No acute findings. No evidence of recurrent carcinoma or metastatic disease   07/09/2021 Imaging   CT CAP  IMPRESSION: 1. No evidence to suggest locally recurrent ianorectal neoplasm or metastatic disease in the chest, abdomen or pelvis. 2. Status post bilateral modified radical mastectomy and bilateral axillary lymph node dissection. 3. Three small supraumbilical ventral hernias containing only omental fat. No associated bowel incarceration or obstruction at this time. 4. Aortic atherosclerosis. 5. Additional incidental findings, as above.   Anal cancer ([place]  06/04/2019 Procedure   Colonoscopy per Dr. SAnson Fret Findings-the digital rectal exam revealed a 3 cm diameter firm rectal mass.  The mass was noncircumferential and located predominantly at the right bowel wall at the anorectal junction. A nonobstructing mass was found at the anus and in the rectum    06/04/2019 Initial Biopsy   Follow pathology: Large intestine, rectum biopsy: Invasive well to  moderately differentiated squamous cell carcinoma.  No rectal mucosa present.  There is strong diffuse expression of P 16 immunostain.  CDX 2, p63 and mCEA immunostains are also used in the diagnostic work-up of the case.   06/04/2019 Initial Diagnosis   [place] cancer [place]   06/21/2019 PET scan   IMPRESSION: 1. Anorectal primary. No hypermetabolic metastatic disease within the chest, abdomen, or pelvis. Perirectal nodes, at least 1 of which is new since 02/26/2018 CT, suspicious based on size and interval development. 2. Mild limitations secondary to beam hardening artifact from bilateral hip arthroplasty. 3.  Aortic Atherosclerosis (ICD10-I70.0).   06/28/2019 Cancer Staging   Staging form: Anus, AJCC 8th Edition - Clinical stage from 06/28/2019: Stage IIA (cT2, cN0, cM0) - Signed by BAlla Feeling NP on 06/28/2019   06/28/2019 - 07/26/2019 Chemotherapy   concurrent chemoRT with Mitomycin and 5FU on week 1 and week 5 on starting 06/28/19. Last dose on 07/26/19   06/28/2019 - 08/09/2019 Radiation Therapy   concurrent chemoRT with Dr MLisbeth Renshaw10/12/20-11/23/20   11/01/2019 PET scan   IMPRESSION: 1. Marked interval decrease in hypermetabolism noted at the level of the anal rectal primary. No evidence for hypermetabolic metastatic disease in the chest, abdomen, or pelvis. The perirectal lymph nodes identified previously have resolved in the interval. 2.  Aortic Atherosclerois (ICD10-170.0)   08/07/2020 Imaging   CT CAP  IMPRESSION: 1. No findings for residual/recurrent anal tumor, regional lymphadenopathy or metastatic disease. 2. Status post cholecystectomy.  No biliary dilatation. 3. Small anterior abdominal wall hernia containing fat. 4. Aortic atherosclerosis.   Aortic Atherosclerosis (ICD10-I70.0).     07/09/2021 Imaging   CT CAP  IMPRESSION: 1. No evidence to suggest locally recurrent ianorectal neoplasm or metastatic disease in the chest, abdomen or pelvis. 2. Status post  bilateral modified radical mastectomy and bilateral axillary lymph node dissection. 3. Three small supraumbilical ventral hernias containing only omental fat. No associated bowel incarceration or obstruction at this time. 4. Aortic atherosclerosis. 5. Additional incidental findings, as above.   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 female with oncologic history as above presenting to [facility] today with multiple complaints including cough, abdominal pain, back pain, rectal bleeding, blood in urine.  Cough has been progressively worsening x2 weeks.  It is productive with green and yellow phlegm.  She has been taking NyQuil for this, unsure if it is helping.  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  There was an RSV outbreak in her daycare class however granddaughter tested negative.  Patient is also endorsing congestion and postnasal drip.  She did experience body aches last night for the first time during this course of illness.   Patient has had abdominal cramping x1 week.  The cramping is constant and located in the middle of her abdomen.  Radiates down to her pelvis.  She rates the pain currently 8 of 10 in severity.  She tried taking Tylenol without any symptom improvement.  Pain was worse after eating pizza last night.  She has decreased p.o. intake over the last week because of changes in taste from chemotherapy. She does have a history of hiatal hernia which she had fixed years ago.  She has had intermittent nausea x3 days without vomiting.  Patient is currently taking Bactrim for UTI, has 3 days remaining.  She has noticed gross  hematuria when voiding overnight x 3 days.  She denies any urinary frequency, diarrhea or vaginal discharge.  Patient also reports rectal bleeding x2 weeks intermittently.  She takes a stool softener which greatly helps with the constipation.  Her stool is typically loose in consistency.  She describes blood bright red blood on the toilet paper and some blood in stool. Denies pain with defecation. This is the first time she is experiencing rectal bleeding since completing radiation therapy. She denies fever of chills.   ROS  All other systems are reviewed and are negative for acute change except as noted in the HPI.    Allergies  Allergen Reactions   Oxycodone-Acetaminophen Hives and Itching   Tramadol Itching and Rash   Codeine Itching and Rash   Vicodin [Hydrocodone-Acetaminophen] Itching and Rash     Past Medical History:  Diagnosis Date   Anal cancer ([place]) dx'd 05/2019   Anxiety    Arthritis    Bilateral breast cancer ([place]) 10/21/2013   Breast cancer ([place])    Cancer ([place])    Depression    Gallstones    GERD (gastroesophageal reflux disease)    doesn't take any meds for this   Headache    h/o migraines  History of bladder infections    History of hiatal hernia    History of migraine    Hyperlipidemia    takes Simvastatin daily   Hypertension    takes Hyzaar   Joint pain    Joint swelling    Lumbar stenosis    Neuropathy 09/07/2013   Pneumonia    Pre-diabetes      Past Surgical History:  Procedure Laterality Date   ABDOMINAL HYSTERECTOMY     bone spur removed from left foot     CHOLECYSTECTOMY     COLONOSCOPY     COLONOSCOPY WITH PROPOFOL Bilateral 12/07/2021   Procedure: COLONOSCOPY WITH PROPOFOL;  Surgeon: Clarene Essex, MD;  Location: [facility];  Service: Endoscopy;  Laterality: Bilateral;   double mastectomy      ESOPHAGOGASTRODUODENOSCOPY N/A 12/07/2021   Procedure: ESOPHAGOGASTRODUODENOSCOPY (EGD);  Surgeon: Clarene Essex, MD;  Location: [facility];   Service: Endoscopy;  Laterality: N/A;   HERNIA REPAIR     umbilical   IR IMAGING GUIDED PORT INSERTION  06/25/2022   right knee arthroscopy     right shoulder arthroscopy     surgery for hiatal hernia     TONSILLECTOMY     TOTAL HIP ARTHROPLASTY Left 02/12/2013   TOTAL HIP ARTHROPLASTY Left 02/12/2013   Procedure: LEFT TOTAL HIP ARTHROPLASTY;  Surgeon: Kerin Salen, MD;  Location: [place];  Service: Orthopedics;  Laterality: Left;   TOTAL HIP ARTHROPLASTY Right 05/03/2013   Procedure: TOTAL HIP ARTHROPLASTY;  Surgeon: Kerin Salen, MD;  Location: [place];  Service: Orthopedics;  Laterality: Right;   TOTAL KNEE ARTHROPLASTY Right 02/27/2015   Procedure: TOTAL KNEE ARTHROPLASTY;  Surgeon: Frederik Pear, MD;  Location: [place];  Service: Orthopedics;  Laterality: Right;   TOTAL SHOULDER ARTHROPLASTY Right 09/12/2016   Procedure: RIGHT TOTAL SHOULDER ARTHROPLASTY;  Surgeon: Tania Ade, MD;  Location: [place];  Service: Orthopedics;  Laterality: Right;  RIGHT TOTAL SHOULDER ARTHROPLASTY    Social History   Socioeconomic History   Marital status: Single    Spouse name: Not on file   Number of children: 1   Years of education: Not on file   Highest education level: Not on file  Occupational History   Not on file  Tobacco Use   Smoking status: Never   Smokeless tobacco: Never  Vaping Use   Vaping Use: Never used  Substance and Sexual Activity   Alcohol use: No   Drug use: No   Sexual activity: Not Currently  Other Topics Concern   Not on file  Social History Narrative   Not on file   Social Determinants of Health   Financial Resource Strain: Not on file  Food Insecurity: Not on file  Transportation Needs: No Transportation Needs (06/17/2019)   PRAPARE - Hydrologist (Medical): No    Lack of Transportation (Non-Medical): No  Physical Activity: Not on file  Stress: Not on file  Social Connections: Not on file  Intimate Partner Violence: Not on file     Family History  Adopted: Yes  Problem Relation Age of Onset   Allergic rhinitis Neg Hx    Angioedema Neg Hx    Atopy Neg Hx    Eczema Neg Hx    Immunodeficiency Neg Hx    Urticaria Neg Hx    Asthma Neg Hx     No current facility-administered medications for this visit.  Current Outpatient Medications:    sulfamethoxazole-trimethoprim (BACTRIM DS) 800-160 MG tablet, Take  1 tablet by mouth 2 (two) times daily for 5 days., Disp: 10 tablet, Rfl: 0   acyclovir (ZOVIRAX) 400 MG tablet, Take 1 tablet (400 mg total) by mouth daily., Disp: 30 tablet, Rfl: 3   aspirin-acetaminophen-caffeine (EXCEDRIN MIGRAINE) 250-250-65 MG tablet, Take 2 tablets by mouth every 6 (six) hours as needed for headache., Disp: , Rfl:    cefpodoxime (VANTIN) 100 MG tablet, Take 1 tablet (100 mg total) by mouth 2 (two) times daily for 7 days., Disp: 14 tablet, Rfl: 0   HYDROcodone-acetaminophen (NORCO) 5-325 MG tablet, Take 1 tablet by mouth every 6 (six) hours as needed for moderate pain., Disp: 30 tablet, Rfl: 0   lidocaine-prilocaine (EMLA) cream, Apply to affected area once, Disp: 30 g, Rfl: 3   LINZESS 72 MCG capsule, Take 72 mcg by mouth daily as needed (constipation)., Disp: , Rfl:    loratadine (CLARITIN) 10 MG tablet, Take 1 tablet (10 mg total) by mouth daily., Disp: 30 tablet, Rfl: 0   losartan (COZAAR) 100 MG tablet, Take 100 mg by mouth daily., Disp: , Rfl: 6   meclizine (ANTIVERT) 12.5 MG tablet, Take 1 tablet (12.5 mg total) by mouth 3 (three) times daily as needed for dizziness., Disp: 30 tablet, Rfl: 0   Menthol-Methyl Salicylate (SALONPAS PAIN RELIEF PATCH EX), Apply 1 patch topically daily as needed (pain)., Disp: , Rfl:    ondansetron (ZOFRAN) 8 MG tablet, Take 1 tablet (8 mg total) by mouth every 8 (eight) hours as needed for nausea or vomiting. Start on the third day after cyclophosphamide chemotherapy., Disp: 30 tablet, Rfl: 1   predniSONE (DELTASONE) 20 MG tablet, Take 3 tablets (60 mg total)  by mouth daily. Take for 2 days starting the day after chemotherapy on day 4, Disp: 30 tablet, Rfl: 0   prochlorperazine (COMPAZINE) 10 MG tablet, Take 1 tablet (10 mg total) by mouth every 6 (six) hours as needed for nausea or vomiting., Disp: 30 tablet, Rfl: 6   rosuvastatin (CRESTOR) 40 MG tablet, Take 40 mg by mouth daily., Disp: , Rfl:    senna-docusate (SENNA S) 8.6-50 MG tablet, Take 2 tablets by mouth at bedtime., Disp: 60 tablet, Rfl: 1  Facility-Administered Medications Ordered in Other Visits:    diphenhydrAMINE (BENADRYL) injection 12.5 mg, 12.5 mg, Intravenous, Once, Godfrey Pick, MD   HYDROmorphone (DILAUDID) injection 0.5 mg, 0.5 mg, Intravenous, Once, Godfrey Pick, MD   lactated ringers bolus 1,000 mL, 1,000 mL, Intravenous, Once, Godfrey Pick, MD  PHYSICAL EXAM: ECOG FS:1 - Symptomatic but completely ambulatory    Vitals:   08/07/22 1151  BP: 103/74  Pulse: 96  Resp: 18  Temp: 98.1 F (36.7 C)  TempSrc: Oral  SpO2: 97%   Physical Exam Vitals and nursing note reviewed.  Constitutional:      Appearance: She is well-developed. She is not ill-appearing or toxic-appearing.  HENT:     Head: Normocephalic.     Nose: Nose normal.     Mouth/Throat:     Mouth: Mucous membranes are dry.  Eyes:     Conjunctiva/sclera: Conjunctivae normal.  Neck:     Vascular: No JVD.  Cardiovascular:     Rate and Rhythm: Normal rate and regular rhythm.     Pulses: Normal pulses.     Heart sounds: Normal heart sounds.  Pulmonary:     Effort: Pulmonary effort is normal.     Breath sounds: Normal breath sounds.     Comments: Dry hacking cough during majority of exam Abdominal:  General: Bowel sounds are normal. There is no distension.     Palpations: Abdomen is soft.     Tenderness: There is right CVA tenderness and left CVA tenderness.     Comments: TTP epigastric, suprapubic areas and LLQ with voluntary guarding  Musculoskeletal:     Cervical back: Normal range of motion.   Skin:    General: Skin is warm and dry.  Neurological:     Mental Status: She is oriented to person, place, and time.        LABORATORY DATA: I have reviewed the data as listed    Latest Ref Rng & Units 08/07/2022   11:19 AM 07/29/2022   12:34 PM 07/22/2022    9:06 AM  CBC  WBC 4.0 - 10.5 K/uL 2.3  3.8  5.4   Hemoglobin 12.0 - 15.0 g/dL 11.2  11.4  11.3   Hematocrit 36.0 - 46.0 % 34.9  35.1  34.6   Platelets 150 - 400 K/uL 134  269  179         Latest Ref Rng & Units 08/07/2022   11:19 AM 07/29/2022   12:34 PM 07/22/2022    9:06 AM  CMP  Glucose 70 - 99 mg/dL 103  88  108   BUN 8 - 23 mg/dL _0 Creatinine 0.44 - 1.00 mg/dL 1.16  1.00  0.92   Sodium 135 - 145 mmol/L 140  142  141   Potassium 3.5 - 5.1 mmol/L 3.1  3.4  3.8   Chloride 98 - 111 mmol/L 108  109  108   CO2 22 - 32 mmol/L _1 Calcium 8.9 - 10.3 mg/dL 8.8  9.0  9.0   Total Protein 6.5 - 8.1 g/dL 6.5  6.9  6.3   Total Bilirubin 0.3 - 1.2 mg/dL 0.5  0.3  0.3   Alkaline Phos 38 - 126 U/L 81  80  99   AST 15 - 41 U/L _2 ALT 0 - 44 U/L _3 RADIOGRAPHIC STUDIES (from last 24 hours if applicable) I have personally reviewed the radiological images as listed and agreed with the findings in the report. No results found.      Visit Diagnosis: 1. 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 Abdominal pain, unspecified abdominal location   3. Hematuria, unspecified type   4. Rectal bleeding      No orders of the defined types were placed in this encounter.   All questions were answered. The patient knows to call the clinic with any problems, questions or concerns. No barriers to learning was detected.  I have spent a total of 30 minutes minutes of face-to-face and non-face-to-face time, preparing to see the patient, obtaining and/or reviewing separately obtained history, performing a medically appropriate examination, counseling and  educating the patient, ordering tests, documenting clinical information in the electronic health record, and care coordination (communications with other health care professionals or caregivers).    Thank you for allowing me to participate in the care of this patient.    Barrie Folk, PA-C Department of Hematology/Oncology [facility] at [facility] Phone: [phone]  [phone]    08/07/2022 2:26 PM

## 2022-08-07 NOTE — Progress Notes (Signed)
Opened in error

## 2022-08-07 NOTE — Discharge Instructions (Addendum)
A prescription for Percocet was under pharmacy.  This is a narcotic medication.  Take only as needed.  Take with Benadryl to minimize itching reaction.  A different prescription was sent to her pharmacy for Pepcid.  This is a acid blocking medication that can relieve upper abdominal discomfort and nausea caused by gastritis and reflux.  This is similar to the medication he received in the emergency department.  Continue to take this twice a day if it does help.  Talk to your primary care doctor about long-term use.  Return to the emergency department at anytime for any new or worsening symptoms of concern.   Happy Thanksgiving.

## 2022-08-07 NOTE — ED Provider Notes (Signed)
[facility] DEPT Provider Note   CSN: [id number] Arrival date & time: 08/07/22  1330     History  Chief Complaint  Patient presents with   Abdominal Pain   Back Pain   Cough   Rectal Bleeding   Hematuria    Cynthia Velasquez is a 71 y.o. female.  HPI Patient presents for multiple complaints.  Medical history includes arthritis, anxiety, breast cancer (2014), depression, GERD, HLD, HTN, lymphoma (this year), anal cancer (2020).  She is currently on Vantin for UTI.  She is currently on chemotherapy (CEOP) for her lymphoma.  Over the past week, she has had abdominal pain.  Her abdominal pain has been generalized.  It has been most prominent in epigastric and right upper quadrant areas.  It is worse with eating.  Patient describes very little p.o. intake due to loss of appetite and loss of taste.  For pain, she takes Tylenol only.  She went to her oncology office today.  While there, they advised her to come to the ED for a CT scan of abdomen pelvis to rule out emergent causes of her abdominal pain.  Patient describes frequent nausea.  She denies any currently.    Home Medications Prior to Admission medications   Medication Sig Start Date End Date Taking? Authorizing Provider  famotidine (PEPCID) 20 MG tablet Take 1 tablet (20 mg total) by mouth 2 (two) times daily. 08/07/22  Yes Godfrey Pick, MD  oxyCODONE-acetaminophen (PERCOCET/ROXICET) 5-325 MG tablet Take 1 tablet by mouth every 8 (eight) hours as needed for up to 5 days for severe pain. 08/07/22 08/12/22 Yes Godfrey Pick, MD  sulfamethoxazole-trimethoprim (BACTRIM DS) 800-160 MG tablet Take 1 tablet by mouth 2 (two) times daily for 5 days. 08/02/22 08/07/22  Brunetta Genera, MD  acyclovir (ZOVIRAX) 400 MG tablet Take 1 tablet (400 mg total) by mouth daily. 06/27/22   Brunetta Genera, MD  aspirin-acetaminophen-caffeine (EXCEDRIN MIGRAINE) [phone] MG tablet Take 2 tablets by mouth every 6 (six)  hours as needed for headache.    [provider]  cefpodoxime (VANTIN) 100 MG tablet Take 1 tablet (100 mg total) by mouth 2 (two) times daily for 7 days. 08/01/22 08/08/22  Brunetta Genera, MD  HYDROcodone-acetaminophen (NORCO) 5-325 MG tablet Take 1 tablet by mouth every 6 (six) hours as needed for moderate pain. 06/27/22   Brunetta Genera, MD  lidocaine-prilocaine (EMLA) cream Apply to affected area once 07/08/22   Brunetta Genera, MD  [facility] 72 MCG capsule Take 72 mcg by mouth daily as needed (constipation). 06/04/19   [provider]  loratadine (CLARITIN) 10 MG tablet Take 1 tablet (10 mg total) by mouth daily. 06/27/22   Brunetta Genera, MD  losartan (COZAAR) 100 MG tablet Take 100 mg by mouth daily. 01/14/17   [provider]  meclizine (ANTIVERT) 12.5 MG tablet Take 1 tablet (12.5 mg total) by mouth 3 (three) times daily as needed for dizziness. 10/27/21   Hayden Rasmussen, MD  Menthol-Methyl Salicylate [facility] PAIN RELIEF PATCH EX) Apply 1 patch topically daily as needed (pain).    [provider]  ondansetron (ZOFRAN) 8 MG tablet Take 1 tablet (8 mg total) by mouth every 8 (eight) hours as needed for nausea or vomiting. Start on the third day after cyclophosphamide chemotherapy. 06/27/22   Brunetta Genera, MD  predniSONE (DELTASONE) 20 MG tablet Take 3 tablets (60 mg total) by mouth daily. Take for 2 days starting the day after  chemotherapy on day 4 07/17/22   Brunetta Genera, MD  prochlorperazine (COMPAZINE) 10 MG tablet Take 1 tablet (10 mg total) by mouth every 6 (six) hours as needed for nausea or vomiting. 06/27/22   Brunetta Genera, MD  rosuvastatin (CRESTOR) 40 MG tablet Take 40 mg by mouth daily. 07/30/19   [provider]  senna-docusate (SENNA S) 8.6-50 MG tablet Take 2 tablets by mouth at bedtime. 07/08/22   Brunetta Genera, MD      Allergies    Oxycodone-acetaminophen, Tramadol, Codeine, and  Vicodin [hydrocodone-acetaminophen]    Review of Systems   Review of Systems  Constitutional:  Positive for appetite change.  Respiratory:  Positive for cough.   Gastrointestinal:  Positive for abdominal pain, blood in stool and nausea.  Genitourinary:  Positive for hematuria.    Physical Exam Updated Vital Signs BP 107/74   Pulse 77   Temp 97.8 F (36.6 C) (Oral)   Resp 16   SpO2 99%  Physical Exam Vitals and nursing note reviewed.  Constitutional:      General: She is not in acute distress.    Appearance: She is well-developed. She is not ill-appearing, toxic-appearing or diaphoretic.  HENT:     Head: Normocephalic and atraumatic.     Mouth/Throat:     Mouth: Mucous membranes are moist.     Pharynx: Oropharynx is clear.  Eyes:     General: No scleral icterus.    Extraocular Movements: Extraocular movements intact.     Conjunctiva/sclera: Conjunctivae normal.  Cardiovascular:     Rate and Rhythm: Normal rate and regular rhythm.     Heart sounds: No murmur heard. Pulmonary:     Effort: Pulmonary effort is normal. No respiratory distress.     Breath sounds: Normal breath sounds. No wheezing or rales.  Abdominal:     Palpations: Abdomen is soft.     Tenderness: There is generalized abdominal tenderness. There is no guarding or rebound.  Musculoskeletal:        General: No swelling.     Cervical back: Neck supple.  Skin:    General: Skin is warm and dry.     Capillary Refill: Capillary refill takes less than 2 seconds.     Coloration: Skin is not cyanotic or jaundiced.  Neurological:     General: No focal deficit present.     Mental Status: She is alert and oriented to person, place, and time.  Psychiatric:        Mood and Affect: Mood normal.        Behavior: Behavior normal.     ED Results / Procedures / Treatments   Labs (all labs ordered are listed, but only abnormal results are displayed) Labs Reviewed  LIPASE, BLOOD  LACTIC ACID, PLASMA  MAGNESIUM   TROPONIN I (HIGH SENSITIVITY)  TROPONIN I (HIGH SENSITIVITY)    EKG EKG Interpretation  Date/Time:  Wednesday August 07 2022 14:44:33 EST Ventricular Rate:  83 PR Interval:  155 QRS Duration: 82 QT Interval:  392 QTC Calculation: 461 R Axis:   35 Text Interpretation: Sinus rhythm Left atrial enlargement Confirmed by Godfrey Pick [phone]) on 08/07/2022 2:53:49 PM  Radiology CT ABDOMEN PELVIS W CONTRAST  Result Date: 08/07/2022 CLINICAL DATA:  Abdominal pain for 1 week, back pain, hematuria and rectal bleeding. EXAM: CT ABDOMEN AND PELVIS WITH CONTRAST TECHNIQUE: Multidetector CT imaging of the abdomen and pelvis was performed using the standard protocol following bolus administration of intravenous contrast. RADIATION DOSE REDUCTION:  This exam was performed according to the departmental dose-optimization program which includes automated exposure control, adjustment of the mA and/or kV according to patient size and/or use of iterative reconstruction technique. CONTRAST:  118m OMNIPAQUE IOHEXOL 300 MG/ML  SOLN COMPARISON:  PET-CT 06/19/2022 FINDINGS: Lower chest: There is dependent subsegmental atelectasis in the lung bases. The imaged heart is unremarkable. Hepatobiliary: The previously seen hypermetabolic lesion in hepatic segment IV seen on the PET-CT from 06/19/2022 is not seen on the current study. There are no focal lesions on the current study. The gallbladder is surgically absent. There is no biliary ductal dilatation. Pancreas: Unremarkable. Spleen: Unremarkable. Adrenals/Urinary Tract: The adrenals are unremarkable. Small hypodense lesions in both kidneys likely reflect benign cysts requiring no specific imaging follow-up. There are no suspicious renal lesions. There are no stones in either kidney or along the course of either ureter. There is symmetric excretion of contrast into the collecting systems on the delayed images. The bladder is obscured by streak artifact from bilateral hip  hardware. Stomach/Bowel: There is a small hiatal hernia. The stomach is otherwise unremarkable. There is no evidence of bowel obstruction. There is no abnormal bowel wall thickening or inflammatory change. The appendix is not identified. Vascular/Lymphatic: The abdominal aorta is nonaneurysmal. The major branch vessels are patent. The main portal and splenic veins are patent. There is no abdominal or pelvic lymphadenopathy. Reproductive: The uterus is surgically absent. There is no adnexal mass. Other: There is no ascites or free air. There are fat containing ventral abdominal hernias, unchanged. Musculoskeletal: Bilateral hip arthroplasty hardware is noted. There is no acute osseous abnormality or suspicious osseous lesion. IMPRESSION: 1. No acute findings in the abdomen or pelvis. No renal stones or hydronephrosis. 2. The hypermetabolic lesion in hepatic segment IV seen on the PET-CT from 06/19/2022 is not seen on the current study. Nonemergent abdominal MRI with and without contrast is recommended. 3. Unchanged fat containing ventral abdominal hernias. Electronically Signed   By: PValetta MoleM.D.   On: 08/07/2022 15:45    Procedures Procedures    Medications Ordered in ED Medications  sodium chloride (PF) 0.9 % injection (has no administration in time range)  alum & mag hydroxide-simeth (MAALOX/MYLANTA) 200-200-20 MG/5ML suspension 30 mL (has no administration in time range)    And  lidocaine (XYLOCAINE) 2 % viscous mouth solution 15 mL (has no administration in time range)  famotidine (PEPCID) tablet 20 mg (has no administration in time range)  potassium chloride SA (KLOR-CON M) CR tablet 40 mEq (has no administration in time range)  lactated ringers bolus 1,000 mL (1,000 mLs Intravenous New Bag/Given 08/07/22 1439)  HYDROmorphone (DILAUDID) injection 0.5 mg (0.5 mg Intravenous Given 08/07/22 1435)  diphenhydrAMINE (BENADRYL) injection 12.5 mg (12.5 mg Intravenous Given 08/07/22 1434)  iohexol  (OMNIPAQUE) 300 MG/ML solution 100 mL (100 mLs Intravenous Contrast Given 08/07/22 1521)    ED Course/ Medical Decision Making/ A&P                           Medical Decision Making Amount and/or Complexity of Data Reviewed Labs: ordered. Radiology: ordered.  Risk Prescription drug management.   This patient presents to the ED for concern of abdominal pain, this involves an extensive number of treatment options, and is a complaint that carries with it a high risk of complications and morbidity.  The differential diagnosis includes metastatic disease, enteritis, colitis, GERD, PUD, pancreatitis   Co morbidities that complicate the patient evaluation  arthritis, anxiety, breast cancer (2014), depression, GERD, HLD, HTN, anal cancer (2020), lymphoma (current)   Additional history obtained:  Additional history obtained from N/A External records from outside source obtained and reviewed including EMR   Lab Tests:  I Ordered, and personally interpreted labs.  The pertinent results include: Normal lipase, normal troponin, normal lactate.  I also reviewed lab work from earlier today at [place] [facility].  Results of these labs showed creatinine close to baseline, hypokalemia with otherwise normal electrolytes, no acute drop in hemoglobin.   Imaging Studies ordered:  I ordered imaging studies including CT of abdomen pelvis I independently visualized and interpreted imaging which showed no acute findings I agree with the radiologist interpretation   Cardiac Monitoring: / EKG:  The patient was maintained on a cardiac monitor.  I personally viewed and interpreted the cardiac monitored which showed an underlying rhythm of: Sinus rhythm   Problem List / ED Course / Critical interventions / Medication management  Patient is a pleasant 71 year old female presenting for primary complaint of abdominal pain.  This has been ongoing for the last week.  It is worse with eating.  It is located  in epigastrium and right upper quadrant.  She informed her outpatient providers at her oncology office about the symptoms and she was sent to the ED for a CT scan.  On arrival, vital signs are normal.  Patient appears to be resting comfortably.  She does endorse ongoing upper abdominal pain.  She denies any current nausea.  I reviewed her lab work from earlier today.  Although she has had a recent blood in stool, does not appear that she has had any clinically significant blood loss.  Urinalysis showed hematuria without evidence of infection.  She is currently on an antibiotic for treatment of UTI.  Lab work is otherwise notable for hypokalemia.  Repletion potassium was ordered.  CT scan was ordered.  Although patient has had itching from narcotic medication, she was agreeable to treat her current pain with narcotic medication.  Dilaudid was ordered in addition to Benadryl.  CT scan revealed no acute findings to explain her symptoms.  She did report improved pain and no side effects from Benadryl and Dilaudid.  Patient states that she would like some narcotic pain medication to have at home for treatment of severe episodes of pain.  This was prescribed.  Additionally, patient is not currently on any treatment for gastritis.  Trial of GI cocktail was given in the ED.  Patient was prescribed Pepcid for empiric treatment of gastritis. I ordered medication including IV fluids for decreased p.o. intake, Dilaudid for analgesia, GI cocktail for treatment of gastritis. Reevaluation of the patient after these medicines showed that the patient improved I have reviewed the patients home medicines and have made adjustments as needed   Social Determinants of Health:  Has access to outpatient care         Final Clinical Impression(s) / ED Diagnoses Final diagnoses:  Upper abdominal pain    Rx / DC Orders ED Discharge Orders          Ordered    oxyCODONE-acetaminophen (PERCOCET/ROXICET) 5-325 MG tablet   Every 8 hours PRN        08/07/22 1704    famotidine (PEPCID) 20 MG tablet  2 times daily        08/07/22 1704              Godfrey Pick, MD 08/07/22 1706

## 2022-08-09 ENCOUNTER — Telehealth: Payer: Self-pay | Admitting: *Deleted

## 2022-08-09 ENCOUNTER — Encounter: Payer: Self-pay | Admitting: Hematology

## 2022-08-09 ENCOUNTER — Encounter: Payer: Self-pay | Admitting: Physician Assistant

## 2022-08-09 ENCOUNTER — Other Ambulatory Visit: Payer: Self-pay | Admitting: *Deleted

## 2022-08-09 DIAGNOSIS — C8448 Peripheral T-cell lymphoma, not classified, lymph nodes of multiple sites: Secondary | ICD-10-CM

## 2022-08-09 DIAGNOSIS — C50412 Malignant neoplasm of upper-outer quadrant of left female breast: Secondary | ICD-10-CM

## 2022-08-09 DIAGNOSIS — K625 Hemorrhage of anus and rectum: Secondary | ICD-10-CM

## 2022-08-09 NOTE — Telephone Encounter (Signed)
Pt sent mychart message with concerns of blood in urine. Pt was recently seen by [facility] for these concerns with abdominal pain. Pt was later seen at the ER for same symptoms. Pt was given a CT scan with no findings related to symptoms. Advised pt that we will make lab appt to keep watch on labs due to bleeding and will consult for possible referral. Pt verbalized understanding. Appt was scheduled for labs.

## 2022-08-12 ENCOUNTER — Inpatient Hospital Stay: Payer: PPO

## 2022-08-12 DIAGNOSIS — R221 Localized swelling, mass and lump, neck: Secondary | ICD-10-CM

## 2022-08-12 DIAGNOSIS — Z170 Estrogen receptor positive status [ER+]: Secondary | ICD-10-CM

## 2022-08-12 DIAGNOSIS — Z5111 Encounter for antineoplastic chemotherapy: Secondary | ICD-10-CM | POA: Diagnosis not present

## 2022-08-12 DIAGNOSIS — C8448 Peripheral T-cell lymphoma, not classified, lymph nodes of multiple sites: Secondary | ICD-10-CM

## 2022-08-12 DIAGNOSIS — C210 Malignant neoplasm of anus, unspecified: Secondary | ICD-10-CM

## 2022-08-12 DIAGNOSIS — D500 Iron deficiency anemia secondary to blood loss (chronic): Secondary | ICD-10-CM

## 2022-08-12 DIAGNOSIS — K625 Hemorrhage of anus and rectum: Secondary | ICD-10-CM

## 2022-08-12 LAB — CBC WITH DIFFERENTIAL (CANCER CENTER ONLY)
Abs Immature Granulocytes: 0.21 10*3/uL — ABNORMAL HIGH (ref 0.00–0.07)
Basophils Absolute: 0 10*3/uL (ref 0.0–0.1)
Basophils Relative: 1 %
Eosinophils Absolute: 0 10*3/uL (ref 0.0–0.5)
Eosinophils Relative: 1 %
HCT: 32.8 % — ABNORMAL LOW (ref 36.0–46.0)
Hemoglobin: 10.6 g/dL — ABNORMAL LOW (ref 12.0–15.0)
Immature Granulocytes: 4 %
Lymphocytes Relative: 15 %
Lymphs Abs: 0.9 10*3/uL (ref 0.7–4.0)
MCH: 27.7 pg (ref 26.0–34.0)
MCHC: 32.3 g/dL (ref 30.0–36.0)
MCV: 85.6 fL (ref 80.0–100.0)
Monocytes Absolute: 0.6 10*3/uL (ref 0.1–1.0)
Monocytes Relative: 11 %
Neutro Abs: 3.9 10*3/uL (ref 1.7–7.7)
Neutrophils Relative %: 68 %
Platelet Count: 160 10*3/uL (ref 150–400)
RBC: 3.83 MIL/uL — ABNORMAL LOW (ref 3.87–5.11)
RDW: 17.1 % — ABNORMAL HIGH (ref 11.5–15.5)
Smear Review: NORMAL
WBC Count: 5.7 10*3/uL (ref 4.0–10.5)
WBC Morphology: 2
nRBC: 0.9 % — ABNORMAL HIGH (ref 0.0–0.2)

## 2022-08-12 LAB — COMPREHENSIVE METABOLIC PANEL
ALT: 14 U/L (ref 0–44)
AST: 13 U/L — ABNORMAL LOW (ref 15–41)
Albumin: 3.6 g/dL (ref 3.5–5.0)
Alkaline Phosphatase: 99 U/L (ref 38–126)
Anion gap: 7 (ref 5–15)
BUN: 11 mg/dL (ref 8–23)
CO2: 25 mmol/L (ref 22–32)
Calcium: 9.1 mg/dL (ref 8.9–10.3)
Chloride: 110 mmol/L (ref 98–111)
Creatinine, Ser: 1.14 mg/dL — ABNORMAL HIGH (ref 0.44–1.00)
GFR, Estimated: 52 mL/min — ABNORMAL LOW (ref >60)
Glucose, Bld: 107 mg/dL — ABNORMAL HIGH (ref 70–99)
Potassium: 3.8 mmol/L (ref 3.5–5.1)
Sodium: 142 mmol/L (ref 135–145)
Total Bilirubin: 0.3 mg/dL (ref 0.3–1.2)
Total Protein: 6.3 g/dL — ABNORMAL LOW (ref 6.5–8.1)

## 2022-08-12 LAB — FERRITIN: Ferritin: 250 ng/mL (ref 11–307)

## 2022-08-12 LAB — SAMPLE TO BLOOD BANK

## 2022-08-12 LAB — LACTATE DEHYDROGENASE: LDH: 178 U/L (ref 98–192)

## 2022-08-13 ENCOUNTER — Encounter: Payer: Self-pay | Admitting: Hematology

## 2022-08-16 ENCOUNTER — Encounter: Payer: Self-pay | Admitting: Hematology

## 2022-08-17 ENCOUNTER — Other Ambulatory Visit: Payer: Self-pay | Admitting: Hematology

## 2022-08-19 MED FILL — Dexamethasone Sodium Phosphate Inj 100 MG/10ML: INTRAMUSCULAR | Qty: 1 | Status: AC

## 2022-08-19 MED FILL — Fosaprepitant Dimeglumine For IV Infusion 150 MG (Base Eq): INTRAVENOUS | Qty: 5 | Status: AC

## 2022-08-20 ENCOUNTER — Inpatient Hospital Stay: Payer: PPO

## 2022-08-20 ENCOUNTER — Inpatient Hospital Stay: Payer: PPO | Attending: Hematology | Admitting: Hematology

## 2022-08-20 VITALS — BP 144/82 | HR 81 | Resp 17

## 2022-08-20 VITALS — BP 146/101 | HR 73 | Temp 97.9°F | Resp 18 | Ht 65.0 in | Wt 215.0 lb

## 2022-08-20 DIAGNOSIS — Z5111 Encounter for antineoplastic chemotherapy: Secondary | ICD-10-CM | POA: Diagnosis not present

## 2022-08-20 DIAGNOSIS — Z5189 Encounter for other specified aftercare: Secondary | ICD-10-CM | POA: Diagnosis not present

## 2022-08-20 DIAGNOSIS — C8441 Peripheral T-cell lymphoma, not classified, lymph nodes of head, face, and neck: Secondary | ICD-10-CM | POA: Insufficient documentation

## 2022-08-20 DIAGNOSIS — C8448 Peripheral T-cell lymphoma, not classified, lymph nodes of multiple sites: Secondary | ICD-10-CM | POA: Diagnosis not present

## 2022-08-20 LAB — CBC WITH DIFFERENTIAL (CANCER CENTER ONLY)
Abs Immature Granulocytes: 0.01 10*3/uL (ref 0.00–0.07)
Basophils Absolute: 0 10*3/uL (ref 0.0–0.1)
Basophils Relative: 1 %
Eosinophils Absolute: 0 10*3/uL (ref 0.0–0.5)
Eosinophils Relative: 1 %
HCT: 34.6 % — ABNORMAL LOW (ref 36.0–46.0)
Hemoglobin: 11.1 g/dL — ABNORMAL LOW (ref 12.0–15.0)
Immature Granulocytes: 0 %
Lymphocytes Relative: 17 %
Lymphs Abs: 0.7 10*3/uL (ref 0.7–4.0)
MCH: 28.1 pg (ref 26.0–34.0)
MCHC: 32.1 g/dL (ref 30.0–36.0)
MCV: 87.6 fL (ref 80.0–100.0)
Monocytes Absolute: 0.5 10*3/uL (ref 0.1–1.0)
Monocytes Relative: 12 %
Neutro Abs: 2.7 10*3/uL (ref 1.7–7.7)
Neutrophils Relative %: 69 %
Platelet Count: 346 10*3/uL (ref 150–400)
RBC: 3.95 MIL/uL (ref 3.87–5.11)
RDW: 18 % — ABNORMAL HIGH (ref 11.5–15.5)
WBC Count: 3.9 10*3/uL — ABNORMAL LOW (ref 4.0–10.5)
nRBC: 0 % (ref 0.0–0.2)

## 2022-08-20 LAB — CMP (CANCER CENTER ONLY)
ALT: 11 U/L (ref 0–44)
AST: 16 U/L (ref 15–41)
Albumin: 3.7 g/dL (ref 3.5–5.0)
Alkaline Phosphatase: 73 U/L (ref 38–126)
Anion gap: 6 (ref 5–15)
BUN: 13 mg/dL (ref 8–23)
CO2: 25 mmol/L (ref 22–32)
Calcium: 9.3 mg/dL (ref 8.9–10.3)
Chloride: 110 mmol/L (ref 98–111)
Creatinine: 0.99 mg/dL (ref 0.44–1.00)
GFR, Estimated: >60 mL/min (ref >60)
Glucose, Bld: 116 mg/dL — ABNORMAL HIGH (ref 70–99)
Potassium: 4.1 mmol/L (ref 3.5–5.1)
Sodium: 141 mmol/L (ref 135–145)
Total Bilirubin: 0.4 mg/dL (ref 0.3–1.2)
Total Protein: 6.7 g/dL (ref 6.5–8.1)

## 2022-08-20 LAB — LACTATE DEHYDROGENASE: LDH: 196 U/L — ABNORMAL HIGH (ref 98–192)

## 2022-08-20 MED ORDER — FAMOTIDINE IN NACL 20-0.9 MG/50ML-% IV SOLN
20.0000 mg | Freq: Once | INTRAVENOUS | Status: AC
  Administered 2022-08-20: 20 mg via INTRAVENOUS

## 2022-08-20 MED ORDER — SODIUM CHLORIDE 0.9% FLUSH
10.0000 mL | INTRAVENOUS | Status: DC | PRN
  Administered 2022-08-20: 10 mL

## 2022-08-20 MED ORDER — ACETAMINOPHEN 325 MG PO TABS
650.0000 mg | ORAL_TABLET | Freq: Once | ORAL | Status: AC
  Administered 2022-08-20: 650 mg via ORAL

## 2022-08-20 MED ORDER — HEPARIN SOD (PORK) LOCK FLUSH 100 UNIT/ML IV SOLN
500.0000 [IU] | Freq: Once | INTRAVENOUS | Status: AC | PRN
  Administered 2022-08-20: 500 [IU]

## 2022-08-20 MED ORDER — SODIUM CHLORIDE 0.9 % IV SOLN
150.0000 mg | Freq: Once | INTRAVENOUS | Status: AC
  Administered 2022-08-20: 150 mg via INTRAVENOUS
  Filled 2022-08-20: qty 150

## 2022-08-20 MED ORDER — SODIUM CHLORIDE 0.9 % IV SOLN
10.0000 mg | Freq: Once | INTRAVENOUS | Status: AC
  Administered 2022-08-20: 10 mg via INTRAVENOUS
  Filled 2022-08-20: qty 10

## 2022-08-20 MED ORDER — SODIUM CHLORIDE 0.9 % IV SOLN
80.0000 mg/m2 | Freq: Once | INTRAVENOUS | Status: AC
  Administered 2022-08-20: 170 mg via INTRAVENOUS
  Filled 2022-08-20: qty 8.5

## 2022-08-20 MED ORDER — VINCRISTINE SULFATE CHEMO INJECTION 1 MG/ML
1.0000 mg | Freq: Once | INTRAVENOUS | Status: AC
  Administered 2022-08-20: 1 mg via INTRAVENOUS
  Filled 2022-08-20: qty 1

## 2022-08-20 MED ORDER — PALONOSETRON HCL INJECTION 0.25 MG/5ML
0.2500 mg | Freq: Once | INTRAVENOUS | Status: AC
  Administered 2022-08-20: 0.25 mg via INTRAVENOUS

## 2022-08-20 MED ORDER — SODIUM CHLORIDE 0.9 % IV SOLN
600.0000 mg/m2 | Freq: Once | INTRAVENOUS | Status: AC
  Administered 2022-08-20: 1280 mg via INTRAVENOUS
  Filled 2022-08-20: qty 64

## 2022-08-20 MED ORDER — SODIUM CHLORIDE 0.9 % IV SOLN: Freq: Once | INTRAVENOUS | Status: AC

## 2022-08-20 MED FILL — Dexamethasone Sodium Phosphate Inj 100 MG/10ML: INTRAMUSCULAR | Qty: 1 | Status: AC

## 2022-08-20 NOTE — Patient Instructions (Signed)
[facility]  Discharge Instructions: Thank you for choosing [facility] to provide your oncology and hematology care.   If you have a lab appointment with the [facility], please go directly to the [facility] and check in at the registration area.   Wear comfortable clothing and clothing appropriate for easy access to any Portacath or PICC line.   We strive to give you quality time with your provider. You may need to reschedule your appointment if you arrive late (15 or more minutes).  Arriving late affects you and other patients whose appointments are after yours.  Also, if you miss three or more appointments without notifying the office, you may be dismissed from the clinic at the provider's discretion.      For prescription refill requests, have your pharmacy contact our office and allow 72 hours for refills to be completed.    Today you received the following chemotherapy and/or immunotherapy agents: Oncovin, Cytoxan and Etoposide.   To help prevent nausea and vomiting after your treatment, we encourage you to take your nausea medication as directed.  BELOW ARE SYMPTOMS THAT SHOULD BE REPORTED IMMEDIATELY: *FEVER GREATER THAN 100.4 F (38 C) OR HIGHER *CHILLS OR SWEATING *NAUSEA AND VOMITING THAT IS NOT CONTROLLED WITH YOUR NAUSEA MEDICATION *UNUSUAL SHORTNESS OF BREATH *UNUSUAL BRUISING OR BLEEDING *URINARY PROBLEMS (pain or burning when urinating, or frequent urination) *BOWEL PROBLEMS (unusual diarrhea, constipation, pain near the anus) TENDERNESS IN MOUTH AND THROAT WITH OR WITHOUT PRESENCE OF ULCERS (sore throat, sores in mouth, or a toothache) UNUSUAL RASH, SWELLING OR PAIN  UNUSUAL VAGINAL DISCHARGE OR ITCHING   Items with * indicate a potential emergency and should be followed up as soon as possible or go to the Emergency Department if any problems should occur.  Please show the CHEMOTHERAPY ALERT CARD or IMMUNOTHERAPY ALERT  CARD at check-in to the Emergency Department and triage nurse.  Should you have questions after your visit or need to cancel or reschedule your appointment, please contact [facility]  Dept: [phone]  and follow the prompts.  Office hours are 8:00 a.m. to 4:30 p.m. Monday - Friday. Please note that voicemails left after 4:00 p.m. may not be returned until the following business day.  We are closed weekends and major holidays. You have access to a nurse at all times for urgent questions. Please call the main number to the clinic Dept: [phone] and follow the prompts.   For any non-urgent questions, you may also contact your provider using MyChart. We now offer e-Visits for anyone 74 and older to request care online for non-urgent symptoms. For details visit [email or web address].   Also download the MyChart app! Go to the app store, search "MyChart", open the app, select [facility], and log in with your MyChart username and password.  Masks are optional in the cancer centers. If you would like for your care team to wear a mask while they are taking care of you, please let them know. You may have one support person who is at least 71 years old accompany you for your appointments. Etoposide Injection What is this medication? ETOPOSIDE (e toe POE side) treats some types of cancer. It works by slowing down the growth of cancer cells. This medicine may be used for other purposes; ask your health care provider or pharmacist if you have questions. COMMON BRAND NAME(S): Etopophos, Toposar, VePesid What should I tell my care team before I take this  medication? They need to know if you have any of these conditions: Infection Kidney disease Liver disease Low blood counts, such as low white cell, platelet, red cell counts An unusual or allergic reaction to etoposide, other medications, foods, dyes, or preservatives If you or your partner are pregnant or trying to get  pregnant Breastfeeding How should I use this medication? This medication is injected into a vein. It is given by your care team in a hospital or clinic setting. Talk to your care team about the use of this medication in children. Special care may be needed. Overdosage: If you think you have taken too much of this medicine contact a poison control center or emergency room at once. NOTE: This medicine is only for you. Do not share this medicine with others. What if I miss a dose? Keep appointments for follow-up doses. It is important not to miss your dose. Call your care team if you are unable to keep an appointment. What may interact with this medication? Warfarin This list may not describe all possible interactions. Give your health care provider a list of all the medicines, herbs, non-prescription drugs, or dietary supplements you use. Also tell them if you smoke, drink alcohol, or use illegal drugs. Some items may interact with your medicine. What should I watch for while using this medication? Your condition will be monitored carefully while you are receiving this medication. This medication may make you feel generally unwell. This is not uncommon as chemotherapy can affect healthy cells as well as cancer cells. Report any side effects. Continue your course of treatment even though you feel ill unless your care team tells you to stop. This medication can cause serious side effects. To reduce the risk, your care team may give you other medications to take before receiving this one. Be sure to follow the directions from your care team. This medication may increase your risk of getting an infection. Call your care team for advice if you get a fever, chills, sore throat, or other symptoms of a cold or flu. Do not treat yourself. Try to avoid being around people who are sick. This medication may increase your risk to bruise or bleed. Call your care team if you notice any unusual bleeding. Talk to  your care team about your risk of cancer. You may be more at risk for certain types of cancers if you take this medication. Talk to your care team if you may be pregnant. Serious birth defects can occur if you take this medication during pregnancy and for 6 months after the last dose. You will need a negative pregnancy test before starting this medication. Contraception is recommended while taking this medication and for 6 months after the last dose. Your care team can help you find the option that works for you. If your partner can get pregnant, use a condom during sex while taking this medication and for 4 months after the last dose. Do not breastfeed while taking this medication. This medication may cause infertility. Talk to your care team if you are concerned about your fertility. What side effects may I notice from receiving this medication? Side effects that you should report to your care team as soon as possible: Allergic reactions--skin rash, itching, hives, swelling of the face, lips, tongue, or throat Infection--fever, chills, cough, sore throat, wounds that don't heal, pain or trouble when passing urine, general feeling of discomfort or being unwell Low red blood cell level--unusual weakness or fatigue, dizziness, headache, trouble breathing  Unusual bruising or bleeding Side effects that usually do not require medical attention (report to your care team if they continue or are bothersome): Diarrhea Fatigue Hair loss Loss of appetite Nausea Vomiting This list may not describe all possible side effects. Call your doctor for medical advice about side effects. You may report side effects to FDA at 1-800-FDA-1088. Where should I keep my medication? This medication is given in a hospital or clinic. It will not be stored at home. NOTE: This sheet is a summary. It may not cover all possible information. If you have questions about this medicine, talk to your doctor, pharmacist, or health  care provider.  2023 Elsevier/Gold Standard (2007-10-24 00:00:00) Cyclophosphamide Injection What is this medication? CYCLOPHOSPHAMIDE (sye kloe FOSS fa mide) treats some types of cancer. It works by slowing down the growth of cancer cells. This medicine may be used for other purposes; ask your health care provider or pharmacist if you have questions. COMMON BRAND NAME(S): Cyclophosphamide, Cytoxan, Neosar What should I tell my care team before I take this medication? They need to know if you have any of these conditions: Heart disease Irregular heartbeat or rhythm Infection Kidney problems Liver disease Low blood cell levels (white cells, platelets, or red blood cells) Lung disease Previous radiation Trouble passing urine An unusual or allergic reaction to cyclophosphamide, other medications, foods, dyes, or preservatives Pregnant or trying to get pregnant Breast-feeding How should I use this medication? This medication is injected into a vein. It is given by your care team in a hospital or clinic setting. Talk to your care team about the use of this medication in children. Special care may be needed. Overdosage: If you think you have taken too much of this medicine contact a poison control center or emergency room at once. NOTE: This medicine is only for you. Do not share this medicine with others. What if I miss a dose? Keep appointments for follow-up doses. It is important not to miss your dose. Call your care team if you are unable to keep an appointment. What may interact with this medication? Amphotericin B Amiodarone Azathioprine Certain antivirals for HIV or hepatitis Certain medications for blood pressure, such as enalapril, lisinopril, quinapril Cyclosporine Diuretics Etanercept Indomethacin Medications that relax muscles Metronidazole Natalizumab Tamoxifen Warfarin This list may not describe all possible interactions. Give your health care provider a list of  all the medicines, herbs, non-prescription drugs, or dietary supplements you use. Also tell them if you smoke, drink alcohol, or use illegal drugs. Some items may interact with your medicine. What should I watch for while using this medication? This medication may make you feel generally unwell. This is not uncommon as chemotherapy can affect healthy cells as well as cancer cells. Report any side effects. Continue your course of treatment even though you feel ill unless your care team tells you to stop. You may need blood work while you are taking this medication. This medication may increase your risk of getting an infection. Call your care team for advice if you get a fever, chills, sore throat, or other symptoms of a cold or flu. Do not treat yourself. Try to avoid being around people who are sick. Avoid taking medications that contain aspirin, acetaminophen, ibuprofen, naproxen, or ketoprofen unless instructed by your care team. These medications may hide a fever. Be careful brushing or flossing your teeth or using a toothpick because you may get an infection or bleed more easily. If you have any dental work done, tell  your dentist you are receiving this medication. Drink water or other fluids as directed. Urinate often, even at night. Some products may contain alcohol. Ask your care team if this medication contains alcohol. Be sure to tell all care teams you are taking this medicine. Certain medicines, like metronidazole and disulfiram, can cause an unpleasant reaction when taken with alcohol. The reaction includes flushing, headache, nausea, vomiting, sweating, and increased thirst. The reaction can last from 30 minutes to several hours. Talk to your care team if you wish to become pregnant or think you might be pregnant. This medication can cause serious birth defects if taken during pregnancy and for 1 year after the last dose. A negative pregnancy test is required before starting this medication. A  reliable form of contraception is recommended while taking this medication and for 1 year after the last dose. Talk to your care team about reliable forms of contraception. Do not father a child while taking this medication and for 4 months after the last dose. Use a condom during this time period. Do not breast-feed while taking this medication or for 1 week after the last dose. This medication may cause infertility. Talk to your care team if you are concerned about your fertility. Talk to your care team about your risk of cancer. You may be more at risk for certain types of cancer if you take this medication. What side effects may I notice from receiving this medication? Side effects that you should report to your care team as soon as possible: Allergic reactions--skin rash, itching, hives, swelling of the face, lips, tongue, or throat Dry cough, shortness of breath or trouble breathing Heart failure--shortness of breath, swelling of the ankles, feet, or hands, sudden weight gain, unusual weakness or fatigue Heart muscle inflammation--unusual weakness or fatigue, shortness of breath, chest pain, fast or irregular heartbeat, dizziness, swelling of the ankles, feet, or hands Heart rhythm changes--fast or irregular heartbeat, dizziness, feeling faint or lightheaded, chest pain, trouble breathing Infection--fever, chills, cough, sore throat, wounds that don't heal, pain or trouble when passing urine, general feeling of discomfort or being unwell Kidney injury--decrease in the amount of urine, swelling of the ankles, hands, or feet Liver injury--right upper belly pain, loss of appetite, nausea, light-colored stool, dark yellow or brown urine, yellowing skin or eyes, unusual weakness or fatigue Low red blood cell level--unusual weakness or fatigue, dizziness, headache, trouble breathing Low sodium level--muscle weakness, fatigue, dizziness, headache, confusion Red or dark brown urine Unusual bruising or  bleeding Side effects that usually do not require medical attention (report to your care team if they continue or are bothersome): Hair loss Irregular menstrual cycles or spotting Loss of appetite Nausea Pain, redness, or swelling with sores inside the mouth or throat Vomiting This list may not describe all possible side effects. Call your doctor for medical advice about side effects. You may report side effects to FDA at 1-800-FDA-1088. Where should I keep my medication? This medication is given in a hospital or clinic. It will not be stored at home. NOTE: This sheet is a summary. It may not cover all possible information. If you have questions about this medicine, talk to your doctor, pharmacist, or health care provider.  2023 Elsevier/Gold Standard (2021-10-23 00:00:00) Vincristine Injection What is this medication? VINCRISTINE (vin KRIS teen) treats some types of cancer. It works by slowing down the growth of cancer cells. This medicine may be used for other purposes; ask your health care provider or pharmacist if you have  questions. COMMON BRAND NAME(S): Oncovin, Vincasar PFS What should I tell my care team before I take this medication? They need to know if you have any of these conditions: Infection Kidney disease Liver disease Low white blood cell levels Lung disease Nervous system disease, such as Charcot-Marie-Tooth (CMT) Recent or ongoing radiation therapy An unusual or allergic reaction to vincristine, other chemotherapy agents, other medications, foods, dyes, or preservatives Pregnant or trying to get pregnant Breast-feeding How should I use this medication? This medication is infused into a vein. It is given by your care team in a hospital or clinic setting. Talk to your care team about the use of this medication in children. While it may be given to children for selected conditions, precautions do apply. Overdosage: If you think you have taken too much of this  medicine contact a poison control center or emergency room at once. NOTE: This medicine is only for you. Do not share this medicine with others. What if I miss a dose? Keep appointments for follow-up doses. It is important not to miss your dose. Call your care team if you are unable to keep an appointment. What may interact with this medication? Do not take this medication with any of the following: Live virus vaccines This medication may also interact with the following: Medications for fungal infections, such as itraconazole or fluconazole Phenytoin Supplements, such as St. John's wort This list may not describe all possible interactions. Give your health care provider a list of all the medicines, herbs, non-prescription drugs, or dietary supplements you use. Also tell them if you smoke, drink alcohol, or use illegal drugs. Some items may interact with your medicine. What should I watch for while using this medication? Your condition will be monitored carefully while you are receiving this medication. This medication may make you feel generally unwell. This is not uncommon as chemotherapy can affect healthy cells as well as cancer cells. Report any side effects. Continue your course of treatment even though you feel ill unless your care team tells you to stop. You may need blood work while taking this medication. This medication may increase your risk to bruise or bleed. Call your care team if you notice any unusual bleeding. This medication may increase your risk of getting an infection. Call your care team for advice if you get a fever, chills, sore throat, or other symptoms of a cold or flu. Do not treat yourself. Try to avoid being around people who are sick. This medication will cause constipation. If you do not have a bowel movement for 3 days, call your care team. Call your care team if you are around anyone with measles, chickenpox, or if you develop sores or blisters that do not heal  properly. Be careful brushing or flossing your teeth or using a toothpick because you may get an infection or bleed more easily. If you have any dental work done, tell your dentist you are receiving this medication Talk to your care team if you or your partner wish to become pregnant or think either of you might be pregnant. This medication can cause serious birth defects. This medication may cause infertility. Talk to your care team if you are concerned about your fertility. Talk to your care team before breastfeeding. Changes to your treatment plan may be needed. What side effects may I notice from receiving this medication? Side effects that you should report to your care team as soon as possible: Allergic reactions--skin rash, itching, hives, swelling of the  face, lips, tongue, or throat High uric acid level--severe pain, redness, warmth, or swelling in joints, pain or trouble passing urine, pain in the lower back or sides Infection--fever, chills, cough, sore throat, wounds that don't heal, pain or trouble when passing urine, general feeling of discomfort or being unwell Pain, tingling, or numbness in the hands or feet, muscle weakness, change in vision, confusion or trouble speaking, loss of balance or coordination, trouble walking, seizures Painful swelling, warmth, or redness of the skin, blisters or sores at the infusion site Shortness of breath or trouble breathing Side effects that usually do not require medical attention (report to your care team if they continue or are bothersome): Constipation Diarrhea Hair loss Loss of appetite Nausea Stomach cramping Vomiting This list may not describe all possible side effects. Call your doctor for medical advice about side effects. You may report side effects to FDA at 1-800-FDA-1088. Where should I keep my medication? This medication is given in a hospital or clinic. It will not be stored at home. NOTE: This sheet is a summary. It may not  cover all possible information. If you have questions about this medicine, talk to your doctor, pharmacist, or health care provider.  2023 Elsevier/Gold Standard (2021-11-27 00:00:00)

## 2022-08-20 NOTE — Progress Notes (Signed)
[place]   Telephone:([phone]) [phone] Fax:([phone]) [place] VISIT   Patient Care Team: Kathyrn Lass, MD as PCP - General (Family Medicine) Brunetta Genera, MD as PCP - Hematology/Oncology (Hematology) Wonda Horner, MD as Consulting Physician (Gastroenterology) Truitt Merle, MD as Consulting Physician (Hematology) Alla Feeling, NP as Nurse Practitioner (Nurse Practitioner) Kyung Rudd, MD as Consulting Physician (Radiation Oncology) Michael Boston, MD as Consulting Physician (General Surgery) Izora Gala, MD as Attending Physician (Otolaryngology)  Date of Service: 08/20/22   CHIEF COMPLAINT: f/u of recently diagnosed T cell lymphoma  SUMMARY OF ONCOLOGIC HISTORY: Oncology History Overview Note  Cancer Staging Anal cancer [facility]) Staging form: Anus, AJCC 8th Edition - Clinical stage from 06/28/2019: Stage IIA (cT2, cN0, cM0) - Signed by Alla Feeling, NP on 06/28/2019  Breast cancer of upper-outer quadrant of left female breast [facility]) Staging form: Breast, AJCC 7th Edition - Clinical stage from 09/07/2013: Stage IIA (T2, N0, cM0) - Signed by [name], MD on 09/07/2013 - Pathologic: Stage IIA (T2, N0, cM0) - Signed by [name], MD on 09/07/2013  Breast cancer of upper-outer quadrant of right female breast [facility]) Staging form: Breast, AJCC 7th Edition - Clinical stage from 09/07/2013: Stage IA (T1a, N0, cM0) - Signed by [name], MD on 09/07/2013 - Pathologic: Stage IA (T1a, N0, cM0) - Signed by [name], MD on 09/07/2013    Breast cancer of upper-outer quadrant of left female breast ([place])  07/10/2007 Procedure   US biopsy showed invasive ductal carcinoma 0.8cm, Nottingham grade 3   07/30/2007 Surgery   She had left breast lumpectomy and LN biopsy which showed high grade invasive ductal cancer Nottingham grade 3, 2.7 cm, ER/PR/Her2 neu negative   11/24/2007 Surgery   Patient elected for bilateral mastectomy    09/07/2008 - 03/08/2009 Chemotherapy   dates are approximate: she received adriamycin and cytoxan followed by Taxol   03/08/2009 - 05/08/2009 Radiation Therapy   dates are approximate, she received XRT    07/09/2021 Imaging   CT CAP  IMPRESSION: 1. No evidence to suggest locally recurrent ianorectal neoplasm or metastatic disease in the chest, abdomen or pelvis. 2. Status post bilateral modified radical mastectomy and bilateral axillary lymph node dissection. 3. Three small supraumbilical ventral hernias containing only omental fat. No associated bowel incarceration or obstruction at this time. 4. Aortic atherosclerosis. 5. Additional incidental findings, as above.   Breast cancer of upper-outer quadrant of right female breast ([place])  07/10/2006 Procedure   stereotactic biopsy showed atypical hyperplasia   10/23/2006 Surgery   right lumpectomy showed invasive ductal ca (Nottingham grade 1)  99m and DCIS, ER/PR positive Her 2 negative   10/09/2007 - 10/08/2012 Chemotherapy   Patient was placed on Tamoxifen   03/24/2017 Imaging   No acute findings. No evidence of recurrent carcinoma or metastatic disease   07/09/2021 Imaging   CT CAP  IMPRESSION: 1. No evidence to suggest locally recurrent ianorectal neoplasm or metastatic disease in the chest, abdomen or pelvis. 2. Status post bilateral modified radical mastectomy and bilateral axillary lymph node dissection. 3. Three small supraumbilical ventral hernias containing only omental fat. No associated bowel incarceration or obstruction at this time. 4. Aortic atherosclerosis. 5. Additional incidental findings, as above.   [place] cancer [place]  06/04/2019 Procedure   Colonoscopy per Dr. SAnson Fret Findings-the digital rectal exam revealed a 3 cm diameter firm rectal mass.  The mass was noncircumferential and located predominantly at the right bowel  wall at the anorectal junction. A nonobstructing mass was found at the anus and in  the rectum    06/04/2019 Initial Biopsy   Follow pathology: Large intestine, rectum biopsy: Invasive well to moderately differentiated squamous cell carcinoma.  No rectal mucosa present.  There is strong diffuse expression of P 16 immunostain.  CDX 2, p63 and mCEA immunostains are also used in the diagnostic work-up of the case.   06/04/2019 Initial Diagnosis   Anal cancer ([place])   06/21/2019 PET scan   IMPRESSION: 1. Anorectal primary. No hypermetabolic metastatic disease within the chest, abdomen, or pelvis. Perirectal nodes, at least 1 of which is new since 02/26/2018 CT, suspicious based on size and interval development. 2. Mild limitations secondary to beam hardening artifact from bilateral hip arthroplasty. 3.  Aortic Atherosclerosis (ICD10-I70.0).   06/28/2019 Cancer Staging   Staging form: Anus, AJCC 8th Edition - Clinical stage from 06/28/2019: Stage IIA (cT2, cN0, cM0) - Signed by Alla Feeling, NP on 06/28/2019   06/28/2019 - 07/26/2019 Chemotherapy   concurrent chemoRT with Mitomycin and 5FU on week 1 and week 5 on starting 06/28/19. Last dose on 07/26/19   06/28/2019 - 08/09/2019 Radiation Therapy   concurrent chemoRT with Dr [name] 06/28/19-08/09/19   11/01/2019 PET scan   IMPRESSION: 1. Marked interval decrease in hypermetabolism noted at the level of the anal rectal primary. No evidence for hypermetabolic metastatic disease in the chest, abdomen, or pelvis. The perirectal lymph nodes identified previously have resolved in the interval. 2.  Aortic Atherosclerois (ICD10-170.0)   08/07/2020 Imaging   CT CAP  IMPRESSION: 1. No findings for residual/recurrent anal tumor, regional lymphadenopathy or metastatic disease. 2. Status post cholecystectomy. No biliary dilatation. 3. Small anterior abdominal wall hernia containing fat. 4. Aortic atherosclerosis.   Aortic Atherosclerosis (ICD10-I70.0).     07/09/2021 Imaging   CT CAP  IMPRESSION: 1. No evidence to  suggest locally recurrent ianorectal neoplasm or metastatic disease in the chest, abdomen or pelvis. 2. Status post bilateral modified radical mastectomy and bilateral axillary lymph node dissection. 3. Three small supraumbilical ventral hernias containing only omental fat. No associated bowel incarceration or obstruction at this time. 4. Aortic atherosclerosis. 5. Additional incidental findings, as above.   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 for evaluation and management of newly diagnosed T-cell non-Hodgkin's lymphoma.  Patient has a history of bilateral breast cancer status post mastectomies.  -2007: s/p right lumpectomy for ADH in 08/2006 showed DCIS ER/PR+, grade 1. 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 She was adopted, no known family history    She subsequently was diagnosed with anal squamous cell carcinoma cT2 N0 M0 diagnosed in September 2020.Workup showed a 3 cm mass at the anorectal junction, biopsy confirmed well to moderately differentiated squamous cell carcinoma. 06/21/19 PET scan showed no metastasis.  -S/p concurrent chemoRT with Mitomycin and 5FU on 08/09/19. Now on surveillance. -surveillance CT CAP 07/09/21 was NED. -  surveillance colonoscopy done for rectal bleeding on 12/07/21 showed only radiation changes. Her rectal bleeding has resolved.  Recently patient presented to her primary care physician on 05/23/2022  with her new right neck lump which has been progressively enlarging.  She also subsequently developed a left neck swelling.  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  She has had a PET CT scan done on 06/19/2022 which showed hypermetabolic bilateral neck lymphadenopathy.  Also noted to have a new 1 cm focus of metabolic activity in segment 4 of the liver.  This is etiology indeterminate. Lumbar scoliosis and small supraumbilical hernia.  Patient has had Port-A-Cath placement done on 06/25/2022. Echocardiogram was done on 06/17/2022 and shows normal ejection fraction of 70 to 75% with grade 1 diastolic dysfunction.  Right ventricular systolic function within normal limits.  INTERVAL HISTORY  Cynthia Velasquez is here today for evaluation and management of newly diagnosed T-cell non-Hodgkin's lymphoma. She is here for toxicity check after her cycle 1 of CEOP. She will be starting her cycle 3 of CEOP on 07/29/2022.  She was last seen by me on 07/22/2022 and complained of muscle cramps, primarily in the hands and legs.  Patient was seen at the ED on 08/07/2022 for abdominal pain, back pain, cough, rectal bleeding, and hematuria. She had an CT scan of abdominal pelvis which did not show any abnormalities.   She complains of recent hand numbness/tingling. She reports her numbness is mostly on right index finger, left index finger, and both thumbs. She notes it is uncomfortable and is slowly getting worse. Patient complains of bilateral hand cramping along with numbness. She notes she occasionally uses her computer. Patient denies any particular thing that helps or worsens the numbness/tingling sensation.   She denies any urine problems, abnormal bowl moments, neck swelling, leg tingling/numbness, leg cramping, loss of appetite, skin rashes, or leg swelling. However, she does complain of  occasional shortness of breath, lower back pain upon touch, abdominal pain due to hernia, and mild loss of taste.   Patient denies of taking vitamin-D and B-complex supplements.  Patient has received the influenza vaccine, COVID-19 Booster, and RSV vaccine.   ROS 10 Point review of Systems was done is negative except as noted above.  MEDICAL HISTORY:  Past Medical History:  Diagnosis Date   Anal cancer ([place]) dx'd 05/2019   Anxiety    Arthritis    Bilateral breast cancer ([place]) 10/21/2013   Breast cancer ([place])    Cancer ([place])    Depression    Gallstones    GERD (gastroesophageal reflux disease)    doesn't take any meds for this   Headache    h/o migraines       History of bladder infections    History of hiatal hernia    History of migraine    Hyperlipidemia    takes Simvastatin daily   Hypertension    takes Hyzaar   Joint pain    Joint swelling    Lumbar stenosis    Neuropathy 09/07/2013   Pneumonia    Pre-diabetes     SURGICAL HISTORY: Past Surgical History:  Procedure Laterality Date   ABDOMINAL HYSTERECTOMY     bone spur removed from left foot     CHOLECYSTECTOMY     COLONOSCOPY     COLONOSCOPY WITH PROPOFOL Bilateral 12/07/2021   Procedure: COLONOSCOPY WITH PROPOFOL;  Surgeon: Clarene Essex, MD;  Location:  [facility];  Service: Endoscopy;  Laterality: Bilateral;   double mastectomy      ESOPHAGOGASTRODUODENOSCOPY N/A 12/07/2021   Procedure: ESOPHAGOGASTRODUODENOSCOPY (EGD);  Surgeon: Clarene Essex, MD;  Location: [facility];  Service: Endoscopy;  Laterality: N/A;   HERNIA REPAIR     umbilical   IR IMAGING GUIDED PORT INSERTION  06/25/2022   right knee arthroscopy     right shoulder arthroscopy     surgery for hiatal hernia     TONSILLECTOMY     TOTAL HIP ARTHROPLASTY Left 02/12/2013   TOTAL HIP ARTHROPLASTY Left 02/12/2013   Procedure: LEFT TOTAL HIP ARTHROPLASTY;  Surgeon: Kerin Salen, MD;  Location: [place];  Service: Orthopedics;  Laterality: Left;   TOTAL  HIP ARTHROPLASTY Right 05/03/2013   Procedure: TOTAL HIP ARTHROPLASTY;  Surgeon: Kerin Salen, MD;  Location: [place];  Service: Orthopedics;  Laterality: Right;   TOTAL KNEE ARTHROPLASTY Right 02/27/2015   Procedure: TOTAL KNEE ARTHROPLASTY;  Surgeon: Frederik Pear, MD;  Location: [place];  Service: Orthopedics;  Laterality: Right;   TOTAL SHOULDER ARTHROPLASTY Right 09/12/2016   Procedure: RIGHT TOTAL SHOULDER ARTHROPLASTY;  Surgeon: Tania Ade, MD;  Location: [place];  Service: Orthopedics;  Laterality: Right;  RIGHT TOTAL SHOULDER ARTHROPLASTY    I have reviewed the social history and family history with the patient and they are unchanged from previous note.  ALLERGIES:  is allergic to oxycodone-acetaminophen, tramadol, codeine, and vicodin [hydrocodone-acetaminophen].  MEDICATIONS:  Current Outpatient Medications  Medication Sig Dispense Refill   acyclovir (ZOVIRAX) 400 MG tablet Take 1 tablet (400 mg total) by mouth daily. 30 tablet 3   aspirin-acetaminophen-caffeine (EXCEDRIN MIGRAINE) 250-250-65 MG tablet Take 2 tablets by mouth every 6 (six) hours as needed for headache.     famotidine (PEPCID) 20 MG tablet Take 1 tablet (20 mg total) by mouth 2 (two) times daily. 30 tablet 0   HYDROcodone-acetaminophen (NORCO) 5-325 MG tablet Take 1 tablet by mouth every 6 (six) hours as needed for moderate pain. 30 tablet 0   lidocaine-prilocaine (EMLA) cream Apply to affected area once 30 g 3   LINZESS 72 MCG capsule Take 72 mcg by mouth daily as needed (constipation).     loratadine (CLARITIN) 10 MG tablet Take 1 tablet (10 mg total) by mouth daily. 30 tablet 0   losartan (COZAAR) 100 MG tablet Take 100 mg by mouth daily.  6   meclizine (ANTIVERT) 12.5 MG tablet Take 1 tablet (12.5 mg total) by mouth 3 (three) times daily as needed for dizziness. 30 tablet 0   Menthol-Methyl Salicylate (SALONPAS PAIN RELIEF PATCH EX) Apply 1 patch topically daily as needed (pain).     ondansetron (ZOFRAN) 8 MG  tablet Take 1 tablet (8 mg total) by mouth every 8 (eight) hours as needed for nausea or vomiting. Start on the third day after cyclophosphamide chemotherapy. 30 tablet 1   predniSONE (DELTASONE) 20 MG tablet Take 3 tablets (60 mg total) by mouth daily. Take for 2 days starting the day after chemotherapy on day 4 30 tablet 0   prochlorperazine (COMPAZINE) 10 MG tablet Take 1 tablet (10 mg total) by mouth every 6 (six) hours as needed for nausea or vomiting. 30 tablet 6   rosuvastatin (CRESTOR) 40 MG tablet Take 40 mg by mouth daily.     senna-docusate (SENNA S) 8.6-50 MG tablet Take 2 tablets by mouth at bedtime. 60 tablet 1   No current facility-administered medications for this visit.    PHYSICAL EXAMINATION: .BP [name] Kitchen)  146/101 (BP Location: Left Arm, Patient Position: Sitting) Comment: nurse notified  Pulse 73   Temp 97.9 F (36.6 C) (Temporal)   Resp 18   Ht _0  (1.651 m)   Wt 215 lb (97.5 kg)   SpO2 100%   BMI 35.78 kg/m  . GENERAL:alert, in no acute distress and comfortable SKIN: no acute rashes, no significant lesions EYES: conjunctiva are pink and non-injected, sclera anicteric OROPHARYNX: MMM, no exudates, no oropharyngeal erythema or ulceration NECK: supple, no JVD LYMPH:  no palpable lymphadenopathy in the cervical, axillary or inguinal regions LUNGS: clear to auscultation b/l with normal respiratory effort HEART: regular rate & rhythm ABDOMEN:  normoactive bowel sounds , non tender, not distended. Extremity: no pedal edema PSYCH: alert & oriented x 3 with fluent speech NEURO: no focal motor/sensory deficits   LABORATORY DATA:  I have reviewed the data as listed    Latest Ref Rng & Units 08/20/2022    9:13 AM 08/12/2022    9:02 AM 08/07/2022   11:19 AM  CBC  WBC 4.0 - 10.5 K/uL 3.9  5.7  2.3   Hemoglobin 12.0 - 15.0 g/dL 11.1  10.6  11.2   Hematocrit 36.0 - 46.0 % 34.6  32.8  34.9   Platelets 150 - 400 K/uL 346  160  134         Latest Ref Rng & Units  08/20/2022    9:13 AM 08/12/2022    9:02 AM 08/07/2022   11:19 AM  CMP  Glucose 70 - 99 mg/dL 116  107  103   BUN 8 - 23 mg/dL _1 Creatinine 0.44 - 1.00 mg/dL 0.99  1.14  1.16   Sodium 135 - 145 mmol/L 141  142  140   Potassium 3.5 - 5.1 mmol/L 4.1  3.8  3.1   Chloride 98 - 111 mmol/L 110  110  108   CO2 22 - 32 mmol/L _2 Calcium 8.9 - 10.3 mg/dL 9.3  9.1  8.8   Total Protein 6.5 - 8.1 g/dL 6.7  6.3  6.5   Total Bilirubin 0.3 - 1.2 mg/dL 0.4  0.3  0.5   Alkaline Phos 38 - 126 U/L 73  99  81   AST 15 - 41 U/L _3 ALT 0 - 44 U/L _4 . Lab Results  Component Value Date   LDH 196 (H) 08/20/2022           RADIOGRAPHIC STUDIES: .CT ABDOMEN PELVIS W CONTRAST  Result Date: 08/07/2022 CLINICAL DATA:  Abdominal pain for 1 week, back pain, hematuria and rectal bleeding. EXAM: CT ABDOMEN AND PELVIS WITH CONTRAST TECHNIQUE: Multidetector CT imaging of the abdomen and pelvis was performed using the standard protocol following bolus administration of intravenous contrast. RADIATION DOSE REDUCTION: This exam was performed according to the departmental dose-optimization program which includes automated exposure control, adjustment of the mA and/or kV according to patient size and/or use of iterative reconstruction technique. CONTRAST:  149m OMNIPAQUE IOHEXOL 300 MG/ML  SOLN COMPARISON:  PET-CT 06/19/2022 FINDINGS: Lower chest: There is dependent subsegmental atelectasis in the lung bases. The imaged heart is unremarkable. Hepatobiliary: The previously seen hypermetabolic lesion in hepatic segment IV seen on the PET-CT from 06/19/2022 is not seen on the current study. There are no focal lesions on the current study. The gallbladder is surgically absent. There is no  biliary ductal dilatation. Pancreas: Unremarkable. Spleen: Unremarkable. Adrenals/Urinary Tract: The adrenals are unremarkable. Small hypodense lesions in both kidneys likely reflect benign cysts  requiring no specific imaging follow-up. There are no suspicious renal lesions. There are no stones in either kidney or along the course of either ureter. There is symmetric excretion of contrast into the collecting systems on the delayed images. The bladder is obscured by streak artifact from bilateral hip hardware. Stomach/Bowel: There is a small hiatal hernia. The stomach is otherwise unremarkable. There is no evidence of bowel obstruction. There is no abnormal bowel wall thickening or inflammatory change. The appendix is not identified. Vascular/Lymphatic: The abdominal aorta is nonaneurysmal. The major branch vessels are patent. The main portal and splenic veins are patent. There is no abdominal or pelvic lymphadenopathy. Reproductive: The uterus is surgically absent. There is no adnexal mass. Other: There is no ascites or free air. There are fat containing ventral abdominal hernias, unchanged. Musculoskeletal: Bilateral hip arthroplasty hardware is noted. There is no acute osseous abnormality or suspicious osseous lesion. IMPRESSION: 1. No acute findings in the abdomen or pelvis. No renal stones or hydronephrosis. 2. The hypermetabolic lesion in hepatic segment IV seen on the PET-CT from 06/19/2022 is not seen on the current study. Nonemergent abdominal MRI with and without contrast is recommended. 3. Unchanged fat containing ventral abdominal hernias. Electronically Signed   By: Valetta Mole M.D.   On: 08/07/2022 15:45     ASSESSMENT & PLAN:  Axel Meas is a 71 y.o. female with   1.  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 Plan -Results of excisional lymph node biopsy showing peripheral T-cell lymphoma not otherwise specified which is CD30 negative were discussed in detail with the patient. PET CT scan was previously reviewed and she appears to have at least stage II disease but depending on the liver lesion etiology this could represent stage IV disease. She has had  anthracycline exposure as a part of her AC regimen for previous treatment of breast cancer and is therefore limited in her total lifetime anthracycline use. CD 30 negative status precludes use of Adcentris. We recommended proceeding with treatment with CEOP and depending on response after 2-3 cycles we could consider using a dose reduced version of anthracyclines with Zinecard for cardiac protection. -We will start the patient on short burst of prednisone to help with decreasing some of her lymph node related pain in the neck pending start of chemotherapy. -Port-A-Cath placed Echo shows normal ejection fraction. -We will need to be mindful of any cytopenias that might arise given patient has had heavy previous treatment with chemotherapy for both her breast cancer as well as anal squamous cell carcinoma. We will dose reduce etoposide and Cytoxan for her first cycle of treatment and adjust accordingly. -Will need G-CSF support with treatment -We will plan to repeat PET CT scan after 3 cycles of treatment -Chemo therapy orders placed and coordinated.  2. Anal Squamous Cell Carcinoma, cT2N0M0-currently in remission.  Details as noted above   3. 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 Completed tamoxifen 09/2007 - 09/2012 -2008: stage pT2N0 left breast invasive ductal carcinoma, triple negative, grade 3; s/p lumpectomy in 07/2007 and bilateral mastectomy 11/2007 (path negative for residual carcinoma in both breasts); s/p adjuvant chemo AC-T and radiation  Plan:  -Discussed labs from 08/20/2022 with the patient. Her WBC count is 3.9 K, hemoglobin is 11.1 K, and platelets  is 346. CMP stable.  -Discussed the CT scan of abdominal pelvis results from 08/07/2022 which did not show any abnormalities.  -Educated the patient to stay well-hydrated.  -Refer patient to Neurologist for bilateral finger numbness/tingling and cramping.  ? Neuropathy vs Carpel tunnel syndrome -Schedule PET scan before the cycle 4.  -Recommend starting vitamin-D and B-Complex supplements.   -will reduce Vincristine to 51m   FOLLOW UP: Referral to neurology Dr VMickeal Skinnerto evaluate numbness in thumb and index fingers bilaterally PET/CT in 2 weeks Plz schedule c4 and C5 as per integrated scheduling   The total time spent in the appointment was 32 minutes* .  All of the patient's questions were answered with apparent satisfaction. The patient knows to call the clinic with any problems, questions or concerns.   GSullivan LoneMD MS AAHIVMS SDigestive Disease CenterCLaurel Surgery And Endoscopy Center LLCHematology/Oncology Physician CTwin [facility] .*Total Encounter Time as defined by the Centers for Medicare and Medicaid Services includes, in addition to the face-to-face time of a patient visit (documented in the note above) non-face-to-face time: obtaining and reviewing outside history, ordering and reviewing medications, tests or procedures, care coordination (communications with other health care professionals or caregivers) and documentation in the medical record.   I, PCleda Mccreedy am acting as a sEducation administratorfor GSullivan Lone MD.   .I have reviewed the above documentation for accuracy and completeness, and I agree with the above. .Brunetta GeneraMD

## 2022-08-20 NOTE — Progress Notes (Signed)
Patient seen by MD today  Vitals are within treatment parameters.  Labs reviewed: and are within treatment parameters.  Per physician team, patient is ready for treatment and there are NO modifications to the treatment plan.  

## 2022-08-21 ENCOUNTER — Inpatient Hospital Stay: Payer: PPO

## 2022-08-21 VITALS — BP 138/78 | HR 78 | Temp 98.2°F | Resp 18

## 2022-08-21 DIAGNOSIS — Z5111 Encounter for antineoplastic chemotherapy: Secondary | ICD-10-CM | POA: Diagnosis not present

## 2022-08-21 DIAGNOSIS — C8448 Peripheral T-cell lymphoma, not classified, lymph nodes of multiple sites: Secondary | ICD-10-CM

## 2022-08-21 MED ORDER — SODIUM CHLORIDE 0.9 % IV SOLN
80.0000 mg/m2 | Freq: Once | INTRAVENOUS | Status: AC
  Administered 2022-08-21: 170 mg via INTRAVENOUS
  Filled 2022-08-21: qty 8.5

## 2022-08-21 MED ORDER — SODIUM CHLORIDE 0.9 % IV SOLN: Freq: Once | INTRAVENOUS | Status: AC

## 2022-08-21 MED ORDER — SODIUM CHLORIDE 0.9 % IV SOLN
10.0000 mg | Freq: Once | INTRAVENOUS | Status: AC
  Administered 2022-08-21: 10 mg via INTRAVENOUS
  Filled 2022-08-21: qty 10

## 2022-08-21 MED FILL — Dexamethasone Sodium Phosphate Inj 100 MG/10ML: INTRAMUSCULAR | Qty: 1 | Status: AC

## 2022-08-22 ENCOUNTER — Inpatient Hospital Stay: Payer: PPO

## 2022-08-22 VITALS — BP 157/81 | HR 66 | Temp 98.4°F | Resp 20 | Ht 65.0 in | Wt 219.8 lb

## 2022-08-22 DIAGNOSIS — C8448 Peripheral T-cell lymphoma, not classified, lymph nodes of multiple sites: Secondary | ICD-10-CM

## 2022-08-22 DIAGNOSIS — Z5111 Encounter for antineoplastic chemotherapy: Secondary | ICD-10-CM | POA: Diagnosis not present

## 2022-08-22 MED ORDER — SODIUM CHLORIDE 0.9 % IV SOLN
10.0000 mg | Freq: Once | INTRAVENOUS | Status: AC
  Administered 2022-08-22: 10 mg via INTRAVENOUS
  Filled 2022-08-22: qty 10

## 2022-08-22 MED ORDER — HEPARIN SOD (PORK) LOCK FLUSH 100 UNIT/ML IV SOLN
500.0000 [IU] | Freq: Once | INTRAVENOUS | Status: AC | PRN
  Administered 2022-08-22: 500 [IU]

## 2022-08-22 MED ORDER — SODIUM CHLORIDE 0.9 % IV SOLN: Freq: Once | INTRAVENOUS | Status: AC

## 2022-08-22 MED ORDER — SODIUM CHLORIDE 0.9 % IV SOLN
80.0000 mg/m2 | Freq: Once | INTRAVENOUS | Status: AC
  Administered 2022-08-22: 170 mg via INTRAVENOUS
  Filled 2022-08-22: qty 8.5

## 2022-08-22 MED ORDER — SODIUM CHLORIDE 0.9% FLUSH
10.0000 mL | INTRAVENOUS | Status: DC | PRN
  Administered 2022-08-22: 10 mL

## 2022-08-22 NOTE — Patient Instructions (Signed)
[facility]  Discharge Instructions: Thank you for choosing [facility] to provide your oncology and hematology care.   If you have a lab appointment with the [facility], please go directly to the [facility] and check in at the registration area.   Wear comfortable clothing and clothing appropriate for easy access to any Portacath or PICC line.   We strive to give you quality time with your provider. You may need to reschedule your appointment if you arrive late (15 or more minutes).  Arriving late affects you and other patients whose appointments are after yours.  Also, if you miss three or more appointments without notifying the office, you may be dismissed from the clinic at the provider's discretion.      For prescription refill requests, have your pharmacy contact our office and allow 72 hours for refills to be completed.    Today you received the following chemotherapy and/or immunotherapy agent: Etoposide.   To help prevent nausea and vomiting after your treatment, we encourage you to take your nausea medication as directed.  BELOW ARE SYMPTOMS THAT SHOULD BE REPORTED IMMEDIATELY: *FEVER GREATER THAN 100.4 F (38 C) OR HIGHER *CHILLS OR SWEATING *NAUSEA AND VOMITING THAT IS NOT CONTROLLED WITH YOUR NAUSEA MEDICATION *UNUSUAL SHORTNESS OF BREATH *UNUSUAL BRUISING OR BLEEDING *URINARY PROBLEMS (pain or burning when urinating, or frequent urination) *BOWEL PROBLEMS (unusual diarrhea, constipation, pain near the anus) TENDERNESS IN MOUTH AND THROAT WITH OR WITHOUT PRESENCE OF ULCERS (sore throat, sores in mouth, or a toothache) UNUSUAL RASH, SWELLING OR PAIN  UNUSUAL VAGINAL DISCHARGE OR ITCHING   Items with * indicate a potential emergency and should be followed up as soon as possible or go to the Emergency Department if any problems should occur.  Please show the CHEMOTHERAPY ALERT CARD or IMMUNOTHERAPY ALERT CARD at check-in to the  Emergency Department and triage nurse.  Should you have questions after your visit or need to cancel or reschedule your appointment, please contact [facility]  Dept: [phone]  and follow the prompts.  Office hours are 8:00 a.m. to 4:30 p.m. Monday - Friday. Please note that voicemails left after 4:00 p.m. may not be returned until the following business day.  We are closed weekends and major holidays. You have access to a nurse at all times for urgent questions. Please call the main number to the clinic Dept: [phone] and follow the prompts.   For any non-urgent questions, you may also contact your provider using MyChart. We now offer e-Visits for anyone 84 and older to request care online for non-urgent symptoms. For details visit [email or web address].   Also download the MyChart app! Go to the app store, search "MyChart", open the app, select [facility], and log in with your MyChart username and password.  Masks are optional in the cancer centers. If you would like for your care team to wear a mask while they are taking care of you, please let them know. You may have one support person who is at least 71 years old accompany you for your appointments. Etoposide Injection What is this medication? ETOPOSIDE (e toe POE side) treats some types of cancer. It works by slowing down the growth of cancer cells. This medicine may be used for other purposes; ask your health care provider or pharmacist if you have questions. COMMON BRAND NAME(S): Etopophos, Toposar, VePesid What should I tell my care team before I take this medication? They need  to know if you have any of these conditions: Infection Kidney disease Liver disease Low blood counts, such as low white cell, platelet, red cell counts An unusual or allergic reaction to etoposide, other medications, foods, dyes, or preservatives If you or your partner are pregnant or trying to get  pregnant Breastfeeding How should I use this medication? This medication is injected into a vein. It is given by your care team in a hospital or clinic setting. Talk to your care team about the use of this medication in children. Special care may be needed. Overdosage: If you think you have taken too much of this medicine contact a poison control center or emergency room at once. NOTE: This medicine is only for you. Do not share this medicine with others. What if I miss a dose? Keep appointments for follow-up doses. It is important not to miss your dose. Call your care team if you are unable to keep an appointment. What may interact with this medication? Warfarin This list may not describe all possible interactions. Give your health care provider a list of all the medicines, herbs, non-prescription drugs, or dietary supplements you use. Also tell them if you smoke, drink alcohol, or use illegal drugs. Some items may interact with your medicine. What should I watch for while using this medication? Your condition will be monitored carefully while you are receiving this medication. This medication may make you feel generally unwell. This is not uncommon as chemotherapy can affect healthy cells as well as cancer cells. Report any side effects. Continue your course of treatment even though you feel ill unless your care team tells you to stop. This medication can cause serious side effects. To reduce the risk, your care team may give you other medications to take before receiving this one. Be sure to follow the directions from your care team. This medication may increase your risk of getting an infection. Call your care team for advice if you get a fever, chills, sore throat, or other symptoms of a cold or flu. Do not treat yourself. Try to avoid being around people who are sick. This medication may increase your risk to bruise or bleed. Call your care team if you notice any unusual bleeding. Talk to  your care team about your risk of cancer. You may be more at risk for certain types of cancers if you take this medication. Talk to your care team if you may be pregnant. Serious birth defects can occur if you take this medication during pregnancy and for 6 months after the last dose. You will need a negative pregnancy test before starting this medication. Contraception is recommended while taking this medication and for 6 months after the last dose. Your care team can help you find the option that works for you. If your partner can get pregnant, use a condom during sex while taking this medication and for 4 months after the last dose. Do not breastfeed while taking this medication. This medication may cause infertility. Talk to your care team if you are concerned about your fertility. What side effects may I notice from receiving this medication? Side effects that you should report to your care team as soon as possible: Allergic reactions--skin rash, itching, hives, swelling of the face, lips, tongue, or throat Infection--fever, chills, cough, sore throat, wounds that don't heal, pain or trouble when passing urine, general feeling of discomfort or being unwell Low red blood cell level--unusual weakness or fatigue, dizziness, headache, trouble breathing Unusual bruising or  bleeding Side effects that usually do not require medical attention (report to your care team if they continue or are bothersome): Diarrhea Fatigue Hair loss Loss of appetite Nausea Vomiting This list may not describe all possible side effects. Call your doctor for medical advice about side effects. You may report side effects to FDA at 1-800-FDA-1088. Where should I keep my medication? This medication is given in a hospital or clinic. It will not be stored at home. NOTE: This sheet is a summary. It may not cover all possible information. If you have questions about this medicine, talk to your doctor, pharmacist, or health  care provider.  2023 [employer]vier/Gold Standard (2007-10-24 00:00:00)

## 2022-08-23 ENCOUNTER — Other Ambulatory Visit: Payer: Self-pay

## 2022-08-24 ENCOUNTER — Inpatient Hospital Stay: Payer: PPO

## 2022-08-24 ENCOUNTER — Other Ambulatory Visit: Payer: Self-pay | Admitting: Hematology

## 2022-08-24 VITALS — BP 152/84 | HR 79 | Temp 97.9°F | Resp 18

## 2022-08-24 DIAGNOSIS — C8448 Peripheral T-cell lymphoma, not classified, lymph nodes of multiple sites: Secondary | ICD-10-CM

## 2022-08-24 DIAGNOSIS — Z5111 Encounter for antineoplastic chemotherapy: Secondary | ICD-10-CM | POA: Diagnosis not present

## 2022-08-24 MED ORDER — PEGFILGRASTIM-CBQV 6 MG/0.6ML ~~LOC~~ SOSY
6.0000 mg | PREFILLED_SYRINGE | Freq: Once | SUBCUTANEOUS | Status: AC
  Administered 2022-08-24: 6 mg via SUBCUTANEOUS
  Filled 2022-08-24: qty 0.6

## 2022-08-26 ENCOUNTER — Encounter: Payer: Self-pay | Admitting: Hematology

## 2022-08-26 ENCOUNTER — Other Ambulatory Visit: Payer: Self-pay | Admitting: Hematology

## 2022-08-26 DIAGNOSIS — C8448 Peripheral T-cell lymphoma, not classified, lymph nodes of multiple sites: Secondary | ICD-10-CM

## 2022-08-26 DIAGNOSIS — Z6836 Body mass index (BMI) 36.0-36.9, adult: Secondary | ICD-10-CM | POA: Diagnosis not present

## 2022-08-26 DIAGNOSIS — R1013 Epigastric pain: Secondary | ICD-10-CM | POA: Diagnosis not present

## 2022-08-26 DIAGNOSIS — C8590 Non-Hodgkin lymphoma, unspecified, unspecified site: Secondary | ICD-10-CM | POA: Diagnosis not present

## 2022-08-26 MED ORDER — PREDNISONE 20 MG PO TABS: 60.0000 mg | ORAL_TABLET | Freq: Every day | ORAL | 0 refills | Status: DC

## 2022-08-29 ENCOUNTER — Encounter: Payer: Self-pay | Admitting: Physician Assistant

## 2022-08-29 ENCOUNTER — Other Ambulatory Visit: Payer: Self-pay

## 2022-08-30 ENCOUNTER — Other Ambulatory Visit: Payer: Self-pay

## 2022-08-30 DIAGNOSIS — C8448 Peripheral T-cell lymphoma, not classified, lymph nodes of multiple sites: Secondary | ICD-10-CM

## 2022-08-30 MED ORDER — NYSTATIN 100000 UNIT/GM EX POWD: 1.0000 | Freq: Three times a day (TID) | CUTANEOUS | 0 refills | Status: DC

## 2022-09-03 ENCOUNTER — Inpatient Hospital Stay (HOSPITAL_BASED_OUTPATIENT_CLINIC_OR_DEPARTMENT_OTHER): Payer: PPO | Admitting: Internal Medicine

## 2022-09-03 VITALS — BP 140/89 | HR 83 | Temp 97.9°F | Resp 18 | Ht 65.0 in | Wt 216.6 lb

## 2022-09-03 DIAGNOSIS — G620 Drug-induced polyneuropathy: Secondary | ICD-10-CM

## 2022-09-03 DIAGNOSIS — T451X5A Adverse effect of antineoplastic and immunosuppressive drugs, initial encounter: Secondary | ICD-10-CM

## 2022-09-03 DIAGNOSIS — Z5111 Encounter for antineoplastic chemotherapy: Secondary | ICD-10-CM | POA: Diagnosis not present

## 2022-09-03 NOTE — Progress Notes (Signed)
[facility] at [address], [place] [address] [phone]   New Patient Evaluation  Date of Service: 09/03/22 Patient Name: Cynthia Velasquez Patient MRN: [id number] Patient DOB: 1951-07-02 Provider: Ventura Sellers, MD  Identifying Statement:  Cynthia Velasquez is a 71 y.o. female with Chemotherapy-induced neuropathy [facility]) who presents for initial consultation and evaluation regarding cancer associated neurologic deficits.    Referring Provider: Kathyrn Lass, MD [place],  [place] [address]  Primary Cancer:  Oncologic History: Oncology History Overview Note  Cancer Staging Anal cancer [facility]) Staging form: Anus, AJCC 8th Edition - Clinical stage from 06/28/2019: Stage IIA (cT2, cN0, cM0) - Signed by Alla Feeling, NP on 06/28/2019  Breast cancer of upper-outer quadrant of left female breast [facility]) Staging form: Breast, AJCC 7th Edition - Clinical stage from 09/07/2013: Stage IIA (T2, N0, cM0) - Signed by [name], MD on 09/07/2013 - Pathologic: Stage IIA (T2, N0, cM0) - Signed by [name], MD on 09/07/2013  Breast cancer of upper-outer quadrant of right female breast [facility]) Staging form: Breast, AJCC 7th Edition - Clinical stage from 09/07/2013: Stage IA (T1a, N0, cM0) - Signed by [name], MD on 09/07/2013 - Pathologic: Stage IA (T1a, N0, cM0) - Signed by [name], MD on 09/07/2013    Breast cancer of upper-outer quadrant of left female breast ([place])  07/10/2007 Procedure   US biopsy showed invasive ductal carcinoma 0.8cm, Nottingham grade 3   07/30/2007 Surgery   She had left breast lumpectomy and LN biopsy which showed high grade invasive ductal cancer Nottingham grade 3, 2.7 cm, ER/PR/Her2 neu negative   11/24/2007 Surgery   Patient elected for bilateral mastectomy   09/07/2008 - 03/08/2009 Chemotherapy   dates are approximate: she received adriamycin and cytoxan followed by Taxol   03/08/2009 -  05/08/2009 Radiation Therapy   dates are approximate, she received XRT    07/09/2021 Imaging   CT CAP  IMPRESSION: 1. No evidence to suggest locally recurrent ianorectal neoplasm or metastatic disease in the chest, abdomen or pelvis. 2. Status post bilateral modified radical mastectomy and bilateral axillary lymph node dissection. 3. Three small supraumbilical ventral hernias containing only omental fat. No associated bowel incarceration or obstruction at this time. 4. Aortic atherosclerosis. 5. Additional incidental findings, as above.   Breast cancer of upper-outer quadrant of right female breast ([place])  07/10/2006 Procedure   stereotactic biopsy showed atypical hyperplasia   10/23/2006 Surgery   right lumpectomy showed invasive ductal ca (Nottingham grade 1)  51m and DCIS, ER/PR positive Her 2 negative   10/09/2007 - 10/08/2012 Chemotherapy   Patient was placed on Tamoxifen   03/24/2017 Imaging   No acute findings. No evidence of recurrent carcinoma or metastatic disease   07/09/2021 Imaging   CT CAP  IMPRESSION: 1. No evidence to suggest locally recurrent ianorectal neoplasm or metastatic disease in the chest, abdomen or pelvis. 2. Status post bilateral modified radical mastectomy and bilateral axillary lymph node dissection. 3. Three small supraumbilical ventral hernias containing only omental fat. No associated bowel incarceration or obstruction at this time. 4. Aortic atherosclerosis. 5. Additional incidental findings, as above.   Anal cancer ([place]  06/04/2019 Procedure   Colonoscopy per Dr. SAnson Fret Findings-the digital rectal exam revealed a 3 cm diameter firm rectal mass.  The mass was noncircumferential and located predominantly at the right bowel wall at the anorectal junction. A nonobstructing mass was found at the anus and in the  rectum    06/04/2019 Initial Biopsy   Follow pathology: Large intestine, rectum biopsy: Invasive well to moderately  differentiated squamous cell carcinoma.  No rectal mucosa present.  There is strong diffuse expression of P 16 immunostain.  CDX 2, p63 and mCEA immunostains are also used in the diagnostic work-up of the case.   06/04/2019 Initial Diagnosis   Anal cancer ([place])   06/21/2019 PET scan   IMPRESSION: 1. Anorectal primary. No hypermetabolic metastatic disease within the chest, abdomen, or pelvis. Perirectal nodes, at least 1 of which is new since 02/26/2018 CT, suspicious based on size and interval development. 2. Mild limitations secondary to beam hardening artifact from bilateral hip arthroplasty. 3.  Aortic Atherosclerosis (ICD10-I70.0).   06/28/2019 Cancer Staging   Staging form: Anus, AJCC 8th Edition - Clinical stage from 06/28/2019: Stage IIA (cT2, cN0, cM0) - Signed by Alla Feeling, NP on 06/28/2019   06/28/2019 - 07/26/2019 Chemotherapy   concurrent chemoRT with Mitomycin and 5FU on week 1 and week 5 on starting 06/28/19. Last dose on 07/26/19   06/28/2019 - 08/09/2019 Radiation Therapy   concurrent chemoRT with Dr [name] 06/28/19-08/09/19   11/01/2019 PET scan   IMPRESSION: 1. Marked interval decrease in hypermetabolism noted at the level of the anal rectal primary. No evidence for hypermetabolic metastatic disease in the chest, abdomen, or pelvis. The perirectal lymph nodes identified previously have resolved in the interval. 2.  Aortic Atherosclerois (ICD10-170.0)   08/07/2020 Imaging   CT CAP  IMPRESSION: 1. No findings for residual/recurrent anal tumor, regional lymphadenopathy or metastatic disease. 2. Status post cholecystectomy. No biliary dilatation. 3. Small anterior abdominal wall hernia containing fat. 4. Aortic atherosclerosis.   Aortic Atherosclerosis (ICD10-I70.0).     07/09/2021 Imaging   CT CAP  IMPRESSION: 1. No evidence to suggest locally recurrent ianorectal neoplasm or metastatic disease in the chest, abdomen or pelvis. 2. Status post bilateral  modified radical mastectomy and bilateral axillary lymph node dissection. 3. Three small supraumbilical ventral hernias containing only omental fat. No associated bowel incarceration or obstruction at this time. 4. Aortic atherosclerosis. 5. Additional incidental findings, as above.   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  Shaneika Rossa presents today to review neuropathic symptoms.  She describes symmetric tingling with some pain and numbness affecting the thumb and forefingers on both hands.  Onset was following first cycle of chemotherapy for lypmhoma, symptoms have exacerbated after each subsequent infusion.  No history of prior neuropathy aside from some symptoms in her right big toe.  No frank weakness in the hands, though she does have some difficulty manipulating buttons and zipper.  No issues with gait, no headaches or seizures.  Medications: Current Outpatient Medications on File Prior to Visit  Medication Sig Dispense Refill   acyclovir (ZOVIRAX) 400 MG tablet Take 1 tablet (400 mg total) by mouth daily. 30 tablet 3   aspirin-acetaminophen-caffeine (EXCEDRIN MIGRAINE) 250-250-65 MG tablet Take 2 tablets by mouth every 6 (six) hours as needed for headache.     famotidine (PEPCID) 20 MG tablet Take 1 tablet (20 mg total) by mouth 2 (  two) times daily. 30 tablet 0   HYDROcodone-acetaminophen (NORCO) 5-325 MG tablet Take 1 tablet by mouth every 6 (six) hours as needed for moderate pain. 30 tablet 0   loratadine (CLARITIN) 10 MG tablet TAKE 1 TABLET(10 MG) BY MOUTH DAILY 30 tablet 0   losartan (COZAAR) 100 MG  tablet Take 100 mg by mouth daily.  6   Menthol-Methyl Salicylate (SALONPAS PAIN RELIEF PATCH EX) Apply 1 patch topically daily as needed (pain).     nystatin (MYCOSTATIN/NYSTOP) powder Apply 1 Application topically 3 (three) times daily. 15 g 0   ondansetron (ZOFRAN) 8 MG tablet Take 1 tablet (8 mg total) by mouth every 8 (eight) hours as needed for nausea or vomiting. Start on the third day after cyclophosphamide chemotherapy. 30 tablet 1   prochlorperazine (COMPAZINE) 10 MG tablet Take 1 tablet (10 mg total) by mouth every 6 (six) hours as needed for nausea or vomiting. 30 tablet 6   senna-docusate (SENNA S) 8.6-50 MG tablet Take 2 tablets by mouth at bedtime. 60 tablet 1   lidocaine-prilocaine (EMLA) cream Apply to affected area once (Patient not taking: Reported on 08/20/2022) 30 g 3   predniSONE (DELTASONE) 20 MG tablet Take 3 tablets (60 mg total) by mouth daily. Take for 2 days starting the day after chemotherapy on day 4 (Patient not taking: Reported on 09/03/2022) 30 tablet 0   No current facility-administered medications on file prior to visit.    Allergies:  Allergies  Allergen Reactions   Oxycodone-Acetaminophen Hives and Itching   Tramadol Itching and Rash   Codeine Itching and Rash   Vicodin [Hydrocodone-Acetaminophen] Itching and Rash   Past Medical History:  Past Medical History:  Diagnosis Date   Anal cancer ([place]) dx'd 05/2019   Anxiety    Arthritis    Bilateral breast cancer ([place]) 10/21/2013   Breast cancer ([place])    Cancer ([place])    Depression    Gallstones    GERD (gastroesophageal reflux disease)    doesn't take any meds for this   Headache    h/o migraines       History of bladder infections    History of hiatal hernia    History of migraine    Hyperlipidemia    takes Simvastatin daily   Hypertension    takes Hyzaar   Joint pain    Joint swelling    Lumbar stenosis    Neuropathy 09/07/2013   Pneumonia    Pre-diabetes    Past Surgical History:  Past  Surgical History:  Procedure Laterality Date   ABDOMINAL HYSTERECTOMY     bone spur removed from left foot     CHOLECYSTECTOMY     COLONOSCOPY     COLONOSCOPY WITH PROPOFOL Bilateral 12/07/2021   Procedure: COLONOSCOPY WITH PROPOFOL;  Surgeon: Clarene Essex, MD;  Location: [facility];  Service: Endoscopy;  Laterality: Bilateral;   double mastectomy      ESOPHAGOGASTRODUODENOSCOPY N/A 12/07/2021   Procedure: ESOPHAGOGASTRODUODENOSCOPY (EGD);  Surgeon: Clarene Essex, MD;  Location: [facility];  Service: Endoscopy;  Laterality: N/A;   HERNIA REPAIR     umbilical   IR IMAGING GUIDED PORT INSERTION  06/25/2022   right knee arthroscopy     right shoulder arthroscopy     surgery for hiatal hernia     TONSILLECTOMY     TOTAL HIP ARTHROPLASTY Left 02/12/2013   TOTAL HIP ARTHROPLASTY Left 02/12/2013   Procedure: LEFT TOTAL HIP ARTHROPLASTY;  Surgeon: Kerin Salen, MD;  Location: [facility]  OR;  Service: Orthopedics;  Laterality: Left;   TOTAL HIP ARTHROPLASTY Right 05/03/2013   Procedure: TOTAL HIP ARTHROPLASTY;  Surgeon: Kerin Salen, MD;  Location: [facility];  Service: Orthopedics;  Laterality: Right;   TOTAL KNEE ARTHROPLASTY Right 02/27/2015   Procedure: TOTAL KNEE ARTHROPLASTY;  Surgeon: Frederik Pear, MD;  Location: [facility];  Service: Orthopedics;  Laterality: Right;   TOTAL SHOULDER ARTHROPLASTY Right 09/12/2016   Procedure: RIGHT TOTAL SHOULDER ARTHROPLASTY;  Surgeon: Tania Ade, MD;  Location: [facility];  Service: Orthopedics;  Laterality: Right;  RIGHT TOTAL SHOULDER ARTHROPLASTY   Social History:  Social History   Socioeconomic History   Marital status: Single    Spouse name: Not on file   Number of children: 1   Years of education: Not on file   Highest education level: Not on file  Occupational History   Not on file  Tobacco Use   Smoking status: Never   Smokeless tobacco: Never  Vaping Use   Vaping Use: Never used  Substance and Sexual Activity   Alcohol use: No   Drug use: No    Sexual activity: Not Currently  Other Topics Concern   Not on file  Social History Narrative   Not on file   Social Determinants of Health   Financial Resource Strain: Not on file  Food Insecurity: Not on file  Transportation Needs: No Transportation Needs (06/17/2019)   PRAPARE - Hydrologist (Medical): No    Lack of Transportation (Non-Medical): No  Physical Activity: Not on file  Stress: Not on file  Social Connections: Not on file  Intimate Partner Violence: Not on file   Family History:  Family History  Adopted: Yes  Problem Relation Age of Onset   Allergic rhinitis Neg Hx    Angioedema Neg Hx    Atopy Neg Hx    Eczema Neg Hx    Immunodeficiency Neg Hx    Urticaria Neg Hx    Asthma Neg Hx     Review of Systems: Constitutional: Doesn't report fevers, chills or abnormal weight loss Eyes: Doesn't report blurriness of vision Ears, nose, mouth, throat, and face: Doesn't report sore throat Respiratory: Doesn't report cough, dyspnea or wheezes Cardiovascular: Doesn't report palpitation, chest discomfort  Gastrointestinal:  Doesn't report nausea, constipation, diarrhea GU: Doesn't report incontinence Skin: Doesn't report skin rashes Neurological: Per HPI Musculoskeletal: Doesn't report joint pain Behavioral/Psych: Doesn't report anxiety  Physical Exam: Vitals:   09/03/22 1346  BP: (!) 140/89  Pulse: 83  Resp: 18  Temp: 97.9 F (36.6 C)  SpO2: 100%   KPS: 80. General: Alert, cooperative, pleasant, in no acute distress Head: Normal EENT: No conjunctival injection or scleral icterus.  Lungs: Resp effort normal Cardiac: Regular rate Abdomen: Non-distended abdomen Skin: No rashes cyanosis or petechiae. Extremities: No clubbing or edema  Neurologic Exam: Mental Status: Awake, alert, attentive to examiner. Oriented to self and environment. Language is fluent with intact comprehension.  Cranial Nerves: Visual acuity is grossly normal.  Visual fields are full. Extra-ocular movements intact. No ptosis. Face is symmetric Motor: Tone and bulk are normal. Power is full in both arms and legs. Reflexes are symmetric, no pathologic reflexes present.  Sensory: Intact to light touch Gait: Normal.   Labs: I have reviewed the data as listed    Component Value Date/Time   NA 141 08/20/2022 0913   NA 140 03/11/2017 1551   K 4.1 08/20/2022 0913   K 3.6 03/11/2017 1551  CL 110 08/20/2022 0913   CO2 25 08/20/2022 0913   CO2 28 03/11/2017 1551   GLUCOSE 116 (H) 08/20/2022 0913   GLUCOSE 86 03/11/2017 1551   BUN 13 08/20/2022 0913   BUN 11.5 03/11/2017 1551   CREATININE 0.99 08/20/2022 0913   CREATININE 0.8 03/11/2017 1551   CALCIUM 9.3 08/20/2022 0913   CALCIUM 9.8 03/11/2017 1551   PROT 6.7 08/20/2022 0913   PROT 7.2 03/11/2017 1551   ALBUMIN 3.7 08/20/2022 0913   ALBUMIN 3.5 03/11/2017 1551   AST 16 08/20/2022 0913   AST 16 03/11/2017 1551   ALT 11 08/20/2022 0913   ALT 14 03/11/2017 1551   ALKPHOS 73 08/20/2022 0913   ALKPHOS 111 03/11/2017 1551   BILITOT 0.4 08/20/2022 0913   BILITOT 0.37 03/11/2017 1551   GFRNONAA >60 08/20/2022 0913   GFRAA >60 03/22/2020 1239   Lab Results  Component Value Date   WBC 3.9 (L) 08/20/2022   NEUTROABS 2.7 08/20/2022   HGB 11.1 (L) 08/20/2022   HCT 34.6 (L) 08/20/2022   MCV 87.6 08/20/2022   PLT 346 08/20/2022    Imaging:  CT ABDOMEN PELVIS W CONTRAST  Result Date: 08/07/2022 CLINICAL DATA:  Abdominal pain for 1 week, back pain, hematuria and rectal bleeding. EXAM: CT ABDOMEN AND PELVIS WITH CONTRAST TECHNIQUE: Multidetector CT imaging of the abdomen and pelvis was performed using the standard protocol following bolus administration of intravenous contrast. RADIATION DOSE REDUCTION: This exam was performed according to the departmental dose-optimization program which includes automated exposure control, adjustment of the mA and/or kV according to patient size and/or use of  iterative reconstruction technique. CONTRAST:  13m OMNIPAQUE IOHEXOL 300 MG/ML  SOLN COMPARISON:  PET-CT 06/19/2022 FINDINGS: Lower chest: There is dependent subsegmental atelectasis in the lung bases. The imaged heart is unremarkable. Hepatobiliary: The previously seen hypermetabolic lesion in hepatic segment IV seen on the PET-CT from 06/19/2022 is not seen on the current study. There are no focal lesions on the current study. The gallbladder is surgically absent. There is no biliary ductal dilatation. Pancreas: Unremarkable. Spleen: Unremarkable. Adrenals/Urinary Tract: The adrenals are unremarkable. Small hypodense lesions in both kidneys likely reflect benign cysts requiring no specific imaging follow-up. There are no suspicious renal lesions. There are no stones in either kidney or along the course of either ureter. There is symmetric excretion of contrast into the collecting systems on the delayed images. The bladder is obscured by streak artifact from bilateral hip hardware. Stomach/Bowel: There is a small hiatal hernia. The stomach is otherwise unremarkable. There is no evidence of bowel obstruction. There is no abnormal bowel wall thickening or inflammatory change. The appendix is not identified. Vascular/Lymphatic: The abdominal aorta is nonaneurysmal. The major branch vessels are patent. The main portal and splenic veins are patent. There is no abdominal or pelvic lymphadenopathy. Reproductive: The uterus is surgically absent. There is no adnexal mass. Other: There is no ascites or free air. There are fat containing ventral abdominal hernias, unchanged. Musculoskeletal: Bilateral hip arthroplasty hardware is noted. There is no acute osseous abnormality or suspicious osseous lesion. IMPRESSION: 1. No acute findings in the abdomen or pelvis. No renal stones or hydronephrosis. 2. The hypermetabolic lesion in hepatic segment IV seen on the PET-CT from 06/19/2022 is not seen on the current study.  Nonemergent abdominal MRI with and without contrast is recommended. 3. Unchanged fat containing ventral abdominal hernias. Electronically Signed   By: PValetta MoleM.D.   On: 08/07/2022 15:45  Assessment/Plan Chemotherapy-induced neuropathy ([place])  Kirstan Fentress presents with clinical syndrome consistent with symmetric, length dependent, small and large fiber peripheral neuropathy.  Etiology is exposure to vincristine chemotherapy.  There may be secondary median compression neuropathy syndrome; arguing against this is symmetric symptoms, clear timing with vincristine administration, negative phalen's test,    We reviewed pathophysiology of chemotherapy induced neuropathy, available treatments, and goals of care.  We discussed the question of further dose reduction or cessation of vincristine, but ultimately this will need to be decided with Dr. Irene Limbo.   Did discuss and recommend trial of nocturnal splinting for carpal tunnel component, if present.  She is not interested, at this point, in neuropathic pain medication.  We spent twenty additional minutes teaching regarding the natural history, biology, and historical experience in the treatment of neurologic complications of cancer.   We appreciate the opportunity to participate in the care of Lorra Freeman.  She may follow up as needed for progressive or refractory symptoms.  All questions were answered. The patient knows to call the clinic with any problems, questions or concerns. No barriers to learning were detected.  The total time spent in the encounter was 40 minutes and more than 50% was on counseling and review of test results   Ventura Sellers, MD Medical Director of Neuro-Oncology [facility] [employer] 09/03/22 2:45 PM

## 2022-09-04 DIAGNOSIS — Z6836 Body mass index (BMI) 36.0-36.9, adult: Secondary | ICD-10-CM | POA: Diagnosis not present

## 2022-09-04 DIAGNOSIS — H109 Unspecified conjunctivitis: Secondary | ICD-10-CM | POA: Diagnosis not present

## 2022-09-06 MED FILL — Fosaprepitant Dimeglumine For IV Infusion 150 MG (Base Eq): INTRAVENOUS | Qty: 5 | Status: AC

## 2022-09-06 MED FILL — Dexamethasone Sodium Phosphate Inj 100 MG/10ML: INTRAMUSCULAR | Qty: 1 | Status: AC

## 2022-09-10 ENCOUNTER — Inpatient Hospital Stay (HOSPITAL_BASED_OUTPATIENT_CLINIC_OR_DEPARTMENT_OTHER): Payer: PPO | Admitting: Hematology

## 2022-09-10 ENCOUNTER — Inpatient Hospital Stay: Payer: PPO

## 2022-09-10 VITALS — BP 157/90 | HR 85 | Temp 99.0°F | Resp 16 | Ht 65.0 in | Wt 218.9 lb

## 2022-09-10 DIAGNOSIS — C8448 Peripheral T-cell lymphoma, not classified, lymph nodes of multiple sites: Secondary | ICD-10-CM | POA: Diagnosis not present

## 2022-09-10 DIAGNOSIS — Z5111 Encounter for antineoplastic chemotherapy: Secondary | ICD-10-CM

## 2022-09-10 DIAGNOSIS — R197 Diarrhea, unspecified: Secondary | ICD-10-CM | POA: Diagnosis not present

## 2022-09-10 DIAGNOSIS — Z95828 Presence of other vascular implants and grafts: Secondary | ICD-10-CM

## 2022-09-10 LAB — CMP (CANCER CENTER ONLY)
ALT: 10 U/L (ref 0–44)
AST: 14 U/L — ABNORMAL LOW (ref 15–41)
Albumin: 3.5 g/dL (ref 3.5–5.0)
Alkaline Phosphatase: 80 U/L (ref 38–126)
Anion gap: 5 (ref 5–15)
BUN: 17 mg/dL (ref 8–23)
CO2: 26 mmol/L (ref 22–32)
Calcium: 9 mg/dL (ref 8.9–10.3)
Chloride: 110 mmol/L (ref 98–111)
Creatinine: 0.95 mg/dL (ref 0.44–1.00)
GFR, Estimated: >60 mL/min (ref >60)
Glucose, Bld: 104 mg/dL — ABNORMAL HIGH (ref 70–99)
Potassium: 3.4 mmol/L — ABNORMAL LOW (ref 3.5–5.1)
Sodium: 141 mmol/L (ref 135–145)
Total Bilirubin: 0.3 mg/dL (ref 0.3–1.2)
Total Protein: 6.1 g/dL — ABNORMAL LOW (ref 6.5–8.1)

## 2022-09-10 LAB — CBC WITH DIFFERENTIAL (CANCER CENTER ONLY)
Abs Immature Granulocytes: 0.01 10*3/uL (ref 0.00–0.07)
Basophils Absolute: 0 10*3/uL (ref 0.0–0.1)
Basophils Relative: 1 %
Eosinophils Absolute: 0.1 10*3/uL (ref 0.0–0.5)
Eosinophils Relative: 1 %
HCT: 31.3 % — ABNORMAL LOW (ref 36.0–46.0)
Hemoglobin: 10 g/dL — ABNORMAL LOW (ref 12.0–15.0)
Immature Granulocytes: 0 %
Lymphocytes Relative: 18 %
Lymphs Abs: 0.7 10*3/uL (ref 0.7–4.0)
MCH: 28.5 pg (ref 26.0–34.0)
MCHC: 31.9 g/dL (ref 30.0–36.0)
MCV: 89.2 fL (ref 80.0–100.0)
Monocytes Absolute: 0.6 10*3/uL (ref 0.1–1.0)
Monocytes Relative: 15 %
Neutro Abs: 2.5 10*3/uL (ref 1.7–7.7)
Neutrophils Relative %: 65 %
Platelet Count: 257 10*3/uL (ref 150–400)
RBC: 3.51 MIL/uL — ABNORMAL LOW (ref 3.87–5.11)
RDW: 18.7 % — ABNORMAL HIGH (ref 11.5–15.5)
WBC Count: 3.8 10*3/uL — ABNORMAL LOW (ref 4.0–10.5)
nRBC: 0 % (ref 0.0–0.2)

## 2022-09-10 LAB — LACTATE DEHYDROGENASE: LDH: 147 U/L (ref 98–192)

## 2022-09-10 MED ORDER — SODIUM CHLORIDE 0.9 % IV SOLN
80.0000 mg/m2 | Freq: Once | INTRAVENOUS | Status: AC
  Administered 2022-09-10: 170 mg via INTRAVENOUS
  Filled 2022-09-10: qty 8.5

## 2022-09-10 MED ORDER — SODIUM CHLORIDE 0.9 % IV SOLN: Freq: Once | INTRAVENOUS | Status: AC

## 2022-09-10 MED ORDER — FAMOTIDINE IN NACL 20-0.9 MG/50ML-% IV SOLN
20.0000 mg | Freq: Once | INTRAVENOUS | Status: AC
  Administered 2022-09-10: 20 mg via INTRAVENOUS
  Filled 2022-09-10: qty 50

## 2022-09-10 MED ORDER — SODIUM CHLORIDE 0.9% FLUSH
10.0000 mL | INTRAVENOUS | Status: AC | PRN
  Administered 2022-09-10: 10 mL

## 2022-09-10 MED ORDER — SODIUM CHLORIDE 0.9% FLUSH
10.0000 mL | INTRAVENOUS | Status: DC | PRN
  Administered 2022-09-10: 10 mL

## 2022-09-10 MED ORDER — VINCRISTINE SULFATE CHEMO INJECTION 1 MG/ML
1.0000 mg | Freq: Once | INTRAVENOUS | Status: AC
  Administered 2022-09-10: 1 mg via INTRAVENOUS
  Filled 2022-09-10: qty 1

## 2022-09-10 MED ORDER — SACCHAROMYCES BOULARDII 500 MG PO PACK: 500.0000 mg | PACK | Freq: Two times a day (BID) | ORAL | 0 refills | Status: DC

## 2022-09-10 MED ORDER — PALONOSETRON HCL INJECTION 0.25 MG/5ML
0.2500 mg | Freq: Once | INTRAVENOUS | Status: AC
  Administered 2022-09-10: 0.25 mg via INTRAVENOUS
  Filled 2022-09-10: qty 5

## 2022-09-10 MED ORDER — SODIUM CHLORIDE 0.9 % IV SOLN
10.0000 mg | Freq: Once | INTRAVENOUS | Status: AC
  Administered 2022-09-10: 10 mg via INTRAVENOUS
  Filled 2022-09-10: qty 10

## 2022-09-10 MED ORDER — SODIUM CHLORIDE 0.9 % IV SOLN
150.0000 mg | Freq: Once | INTRAVENOUS | Status: AC
  Administered 2022-09-10: 150 mg via INTRAVENOUS
  Filled 2022-09-10: qty 150

## 2022-09-10 MED ORDER — HEPARIN SOD (PORK) LOCK FLUSH 100 UNIT/ML IV SOLN
500.0000 [IU] | Freq: Once | INTRAVENOUS | Status: AC | PRN
  Administered 2022-09-10: 500 [IU]

## 2022-09-10 MED ORDER — SODIUM CHLORIDE 0.9 % IV SOLN
600.0000 mg/m2 | Freq: Once | INTRAVENOUS | Status: AC
  Administered 2022-09-10: 1280 mg via INTRAVENOUS
  Filled 2022-09-10: qty 64

## 2022-09-10 MED ORDER — ACETAMINOPHEN 325 MG PO TABS
650.0000 mg | ORAL_TABLET | Freq: Once | ORAL | Status: AC
  Administered 2022-09-10: 650 mg via ORAL
  Filled 2022-09-10: qty 2

## 2022-09-10 MED FILL — Dexamethasone Sodium Phosphate Inj 100 MG/10ML: INTRAMUSCULAR | Qty: 1 | Status: AC

## 2022-09-10 NOTE — Progress Notes (Signed)
Pt has had diarrhea x 4 days but no BM here today.  Gave urine cup & supplies to collect stool specimen & bring back to office within 2 hours if collected.  Pt understands instructions.

## 2022-09-10 NOTE — Patient Instructions (Signed)
[facility] ONCOLOGY  Discharge Instructions: Thank you for choosing [facility] to provide your oncology and hematology care.   If you have a lab appointment with the [facility], please go directly to the [facility] and check in at the registration area.   Wear comfortable clothing and clothing appropriate for easy access to any Portacath or PICC line.   We strive to give you quality time with your provider. You may need to reschedule your appointment if you arrive late (15 or more minutes).  Arriving late affects you and other patients whose appointments are after yours.  Also, if you miss three or more appointments without notifying the office, you may be dismissed from the clinic at the provider's discretion.      For prescription refill requests, have your pharmacy contact our office and allow 72 hours for refills to be completed.    Today you received the following chemotherapy and/or immunotherapy agents :  Vincristine, Cyclophosphamide, Etoposide, dexamethasone.       To help prevent nausea and vomiting after your treatment, we encourage you to take your nausea medication as directed.  BELOW ARE SYMPTOMS THAT SHOULD BE REPORTED IMMEDIATELY: *FEVER GREATER THAN 100.4 F (38 C) OR HIGHER *CHILLS OR SWEATING *NAUSEA AND VOMITING THAT IS NOT CONTROLLED WITH YOUR NAUSEA MEDICATION *UNUSUAL SHORTNESS OF BREATH *UNUSUAL BRUISING OR BLEEDING *URINARY PROBLEMS (pain or burning when urinating, or frequent urination) *BOWEL PROBLEMS (unusual diarrhea, constipation, pain near the anus) TENDERNESS IN MOUTH AND THROAT WITH OR WITHOUT PRESENCE OF ULCERS (sore throat, sores in mouth, or a toothache) UNUSUAL RASH, SWELLING OR PAIN  UNUSUAL VAGINAL DISCHARGE OR ITCHING   Items with * indicate a potential emergency and should be followed up as soon as possible or go to the Emergency Department if any problems should occur.  Please show the CHEMOTHERAPY  ALERT CARD or IMMUNOTHERAPY ALERT CARD at check-in to the Emergency Department and triage nurse.  Should you have questions after your visit or need to cancel or reschedule your appointment, please contact [facility]  Dept: [phone]  and follow the prompts.  Office hours are 8:00 a.m. to 4:30 p.m. Monday - Friday. Please note that voicemails left after 4:00 p.m. may not be returned until the following business day.  We are closed weekends and major holidays. You have access to a nurse at all times for urgent questions. Please call the main number to the clinic Dept: [phone] and follow the prompts.   For any non-urgent questions, you may also contact your provider using MyChart. We now offer e-Visits for anyone 51 and older to request care online for non-urgent symptoms. For details visit [email or web address].   Also download the MyChart app! Go to the app store, search "MyChart", open the app, select [facility], and log in with your MyChart username and password.  Masks are optional in the cancer centers. If you would like for your care team to wear a mask while they are taking care of you, please let them know. You may have one support person who is at least 71 years old accompany you for your appointments.

## 2022-09-11 ENCOUNTER — Inpatient Hospital Stay: Payer: PPO

## 2022-09-11 VITALS — BP 139/84 | HR 83 | Temp 98.2°F | Resp 18

## 2022-09-11 DIAGNOSIS — C8448 Peripheral T-cell lymphoma, not classified, lymph nodes of multiple sites: Secondary | ICD-10-CM

## 2022-09-11 DIAGNOSIS — Z5111 Encounter for antineoplastic chemotherapy: Secondary | ICD-10-CM | POA: Diagnosis not present

## 2022-09-11 MED ORDER — SODIUM CHLORIDE 0.9 % IV SOLN
10.0000 mg | Freq: Once | INTRAVENOUS | Status: AC
  Administered 2022-09-11: 10 mg via INTRAVENOUS
  Filled 2022-09-11: qty 10

## 2022-09-11 MED ORDER — HEPARIN SOD (PORK) LOCK FLUSH 100 UNIT/ML IV SOLN
500.0000 [IU] | Freq: Once | INTRAVENOUS | Status: AC | PRN
  Administered 2022-09-11: 500 [IU]

## 2022-09-11 MED ORDER — SODIUM CHLORIDE 0.9 % IV SOLN
80.0000 mg/m2 | Freq: Once | INTRAVENOUS | Status: AC
  Administered 2022-09-11: 170 mg via INTRAVENOUS
  Filled 2022-09-11: qty 8.5

## 2022-09-11 MED ORDER — SODIUM CHLORIDE 0.9 % IV SOLN: Freq: Once | INTRAVENOUS | Status: AC

## 2022-09-11 MED ORDER — SODIUM CHLORIDE 0.9% FLUSH
10.0000 mL | INTRAVENOUS | Status: DC | PRN
  Administered 2022-09-11: 10 mL

## 2022-09-11 MED FILL — Dexamethasone Sodium Phosphate Inj 100 MG/10ML: INTRAMUSCULAR | Qty: 1 | Status: AC

## 2022-09-11 NOTE — Patient Instructions (Signed)
[facility]  Discharge Instructions: Thank you for choosing [facility] to provide your oncology and hematology care.   If you have a lab appointment with the [facility], please go directly to the [facility] and check in at the registration area.   Wear comfortable clothing and clothing appropriate for easy access to any Portacath or PICC line.   We strive to give you quality time with your provider. You may need to reschedule your appointment if you arrive late (15 or more minutes).  Arriving late affects you and other patients whose appointments are after yours.  Also, if you miss three or more appointments without notifying the office, you may be dismissed from the clinic at the provider's discretion.      For prescription refill requests, have your pharmacy contact our office and allow 72 hours for refills to be completed.    Today you received the following chemotherapy and/or immunotherapy agent: Etoposide.   To help prevent nausea and vomiting after your treatment, we encourage you to take your nausea medication as directed.  BELOW ARE SYMPTOMS THAT SHOULD BE REPORTED IMMEDIATELY: *FEVER GREATER THAN 100.4 F (38 C) OR HIGHER *CHILLS OR SWEATING *NAUSEA AND VOMITING THAT IS NOT CONTROLLED WITH YOUR NAUSEA MEDICATION *UNUSUAL SHORTNESS OF BREATH *UNUSUAL BRUISING OR BLEEDING *URINARY PROBLEMS (pain or burning when urinating, or frequent urination) *BOWEL PROBLEMS (unusual diarrhea, constipation, pain near the anus) TENDERNESS IN MOUTH AND THROAT WITH OR WITHOUT PRESENCE OF ULCERS (sore throat, sores in mouth, or a toothache) UNUSUAL RASH, SWELLING OR PAIN  UNUSUAL VAGINAL DISCHARGE OR ITCHING   Items with * indicate a potential emergency and should be followed up as soon as possible or go to the Emergency Department if any problems should occur.  Please show the CHEMOTHERAPY ALERT CARD or IMMUNOTHERAPY ALERT CARD at check-in to the  Emergency Department and triage nurse.  Should you have questions after your visit or need to cancel or reschedule your appointment, please contact [facility]  Dept: [phone]  and follow the prompts.  Office hours are 8:00 a.m. to 4:30 p.m. Monday - Friday. Please note that voicemails left after 4:00 p.m. may not be returned until the following business day.  We are closed weekends and major holidays. You have access to a nurse at all times for urgent questions. Please call the main number to the clinic Dept: [phone] and follow the prompts.   For any non-urgent questions, you may also contact your provider using MyChart. We now offer e-Visits for anyone 65 and older to request care online for non-urgent symptoms. For details visit [email or web address].   Also download the MyChart app! Go to the app store, search "MyChart", open the app, select [facility], and log in with your MyChart username and password.  Masks are optional in the cancer centers. If you would like for your care team to wear a mask while they are taking care of you, please let them know. You may have one support person who is at least 71 years old accompany you for your appointments. Etoposide Injection What is this medication? ETOPOSIDE (e toe POE side) treats some types of cancer. It works by slowing down the growth of cancer cells. This medicine may be used for other purposes; ask your health care provider or pharmacist if you have questions. COMMON BRAND NAME(S): Etopophos, Toposar, VePesid What should I tell my care team before I take this medication? They need  to know if you have any of these conditions: Infection Kidney disease Liver disease Low blood counts, such as low white cell, platelet, red cell counts An unusual or allergic reaction to etoposide, other medications, foods, dyes, or preservatives If you or your partner are pregnant or trying to get  pregnant Breastfeeding How should I use this medication? This medication is injected into a vein. It is given by your care team in a hospital or clinic setting. Talk to your care team about the use of this medication in children. Special care may be needed. Overdosage: If you think you have taken too much of this medicine contact a poison control center or emergency room at once. NOTE: This medicine is only for you. Do not share this medicine with others. What if I miss a dose? Keep appointments for follow-up doses. It is important not to miss your dose. Call your care team if you are unable to keep an appointment. What may interact with this medication? Warfarin This list may not describe all possible interactions. Give your health care provider a list of all the medicines, herbs, non-prescription drugs, or dietary supplements you use. Also tell them if you smoke, drink alcohol, or use illegal drugs. Some items may interact with your medicine. What should I watch for while using this medication? Your condition will be monitored carefully while you are receiving this medication. This medication may make you feel generally unwell. This is not uncommon as chemotherapy can affect healthy cells as well as cancer cells. Report any side effects. Continue your course of treatment even though you feel ill unless your care team tells you to stop. This medication can cause serious side effects. To reduce the risk, your care team may give you other medications to take before receiving this one. Be sure to follow the directions from your care team. This medication may increase your risk of getting an infection. Call your care team for advice if you get a fever, chills, sore throat, or other symptoms of a cold or flu. Do not treat yourself. Try to avoid being around people who are sick. This medication may increase your risk to bruise or bleed. Call your care team if you notice any unusual bleeding. Talk to  your care team about your risk of cancer. You may be more at risk for certain types of cancers if you take this medication. Talk to your care team if you may be pregnant. Serious birth defects can occur if you take this medication during pregnancy and for 6 months after the last dose. You will need a negative pregnancy test before starting this medication. Contraception is recommended while taking this medication and for 6 months after the last dose. Your care team can help you find the option that works for you. If your partner can get pregnant, use a condom during sex while taking this medication and for 4 months after the last dose. Do not breastfeed while taking this medication. This medication may cause infertility. Talk to your care team if you are concerned about your fertility. What side effects may I notice from receiving this medication? Side effects that you should report to your care team as soon as possible: Allergic reactions--skin rash, itching, hives, swelling of the face, lips, tongue, or throat Infection--fever, chills, cough, sore throat, wounds that don't heal, pain or trouble when passing urine, general feeling of discomfort or being unwell Low red blood cell level--unusual weakness or fatigue, dizziness, headache, trouble breathing Unusual bruising or  bleeding Side effects that usually do not require medical attention (report to your care team if they continue or are bothersome): Diarrhea Fatigue Hair loss Loss of appetite Nausea Vomiting This list may not describe all possible side effects. Call your doctor for medical advice about side effects. You may report side effects to FDA at 1-800-FDA-1088. Where should I keep my medication? This medication is given in a hospital or clinic. It will not be stored at home. NOTE: This sheet is a summary. It may not cover all possible information. If you have questions about this medicine, talk to your doctor, pharmacist, or health  care provider.  2023 Elsevier/Gold Standard (2007-10-24 00:00:00)

## 2022-09-12 ENCOUNTER — Inpatient Hospital Stay: Payer: PPO

## 2022-09-12 ENCOUNTER — Encounter: Payer: Self-pay | Admitting: Hematology

## 2022-09-12 VITALS — BP 155/85 | HR 74 | Temp 97.9°F | Resp 18

## 2022-09-12 DIAGNOSIS — C8448 Peripheral T-cell lymphoma, not classified, lymph nodes of multiple sites: Secondary | ICD-10-CM

## 2022-09-12 DIAGNOSIS — Z5111 Encounter for antineoplastic chemotherapy: Secondary | ICD-10-CM | POA: Diagnosis not present

## 2022-09-12 MED ORDER — SODIUM CHLORIDE 0.9 % IV SOLN
80.0000 mg/m2 | Freq: Once | INTRAVENOUS | Status: AC
  Administered 2022-09-12: 170 mg via INTRAVENOUS
  Filled 2022-09-12: qty 8.5

## 2022-09-12 MED ORDER — SODIUM CHLORIDE 0.9 % IV SOLN: Freq: Once | INTRAVENOUS | Status: AC

## 2022-09-12 MED ORDER — SODIUM CHLORIDE 0.9% FLUSH
10.0000 mL | INTRAVENOUS | Status: DC | PRN
  Administered 2022-09-12: 10 mL

## 2022-09-12 MED ORDER — SODIUM CHLORIDE 0.9 % IV SOLN
10.0000 mg | Freq: Once | INTRAVENOUS | Status: AC
  Administered 2022-09-12: 10 mg via INTRAVENOUS
  Filled 2022-09-12: qty 10

## 2022-09-12 MED ORDER — HEPARIN SOD (PORK) LOCK FLUSH 100 UNIT/ML IV SOLN
500.0000 [IU] | Freq: Once | INTRAVENOUS | Status: AC | PRN
  Administered 2022-09-12: 500 [IU]

## 2022-09-12 NOTE — Patient Instructions (Signed)
[facility]  Discharge Instructions: Thank you for choosing [facility] to provide your oncology and hematology care.   If you have a lab appointment with the Cancer Center, please go directly to the Cancer Center and check in at the registration area.   Wear comfortable clothing and clothing appropriate for easy access to any Portacath or PICC line.   We strive to give you quality time with your provider. You may need to reschedule your appointment if you arrive late (15 or more minutes).  Arriving late affects you and other patients whose appointments are after yours.  Also, if you miss three or more appointments without notifying the office, you may be dismissed from the clinic at the provider's discretion.      For prescription refill requests, have your pharmacy contact our office and allow 72 hours for refills to be completed.    Today you received the following chemotherapy and/or immunotherapy agents: Etoposide.   To help prevent nausea and vomiting after your treatment, we encourage you to take your nausea medication as directed.  BELOW ARE SYMPTOMS THAT SHOULD BE REPORTED IMMEDIATELY: *FEVER GREATER THAN 100.4 F (38 C) OR HIGHER *CHILLS OR SWEATING *NAUSEA AND VOMITING THAT IS NOT CONTROLLED WITH YOUR NAUSEA MEDICATION *UNUSUAL SHORTNESS OF BREATH *UNUSUAL BRUISING OR BLEEDING *URINARY PROBLEMS (pain or burning when urinating, or frequent urination) *BOWEL PROBLEMS (unusual diarrhea, constipation, pain near the anus) TENDERNESS IN MOUTH AND THROAT WITH OR WITHOUT PRESENCE OF ULCERS (sore throat, sores in mouth, or a toothache) UNUSUAL RASH, SWELLING OR PAIN  UNUSUAL VAGINAL DISCHARGE OR ITCHING   Items with * indicate a potential emergency and should be followed up as soon as possible or go to the Emergency Department if any problems should occur.  Please show the CHEMOTHERAPY ALERT CARD or IMMUNOTHERAPY ALERT CARD at check-in to  the Emergency Department and triage nurse.  Should you have questions after your visit or need to cancel or reschedule your appointment, please contact [facility]  Dept: [phone]  and follow the prompts.  Office hours are 8:00 a.m. to 4:30 p.m. Monday - Friday. Please note that voicemails left after 4:00 p.m. may not be returned until the following business day.  We are closed weekends and major holidays. You have access to a nurse at all times for urgent questions. Please call the main number to the clinic Dept: [phone] and follow the prompts.   For any non-urgent questions, you may also contact your provider using MyChart. We now offer e-Visits for anyone 18 and older to request care online for non-urgent symptoms. For details visit [email or web address].   Also download the MyChart app! Go to the app store, search "MyChart", open the app, select [facility], and log in with your MyChart username and password.   

## 2022-09-14 ENCOUNTER — Inpatient Hospital Stay: Payer: PPO

## 2022-09-14 VITALS — BP 159/87 | HR 75 | Temp 98.1°F | Resp 20

## 2022-09-14 DIAGNOSIS — C8448 Peripheral T-cell lymphoma, not classified, lymph nodes of multiple sites: Secondary | ICD-10-CM

## 2022-09-14 DIAGNOSIS — Z5111 Encounter for antineoplastic chemotherapy: Secondary | ICD-10-CM | POA: Diagnosis not present

## 2022-09-14 MED ORDER — PEGFILGRASTIM-CBQV 6 MG/0.6ML ~~LOC~~ SOSY
6.0000 mg | PREFILLED_SYRINGE | Freq: Once | SUBCUTANEOUS | Status: AC
  Administered 2022-09-14: 6 mg via SUBCUTANEOUS

## 2022-09-17 ENCOUNTER — Encounter: Payer: Self-pay | Admitting: Hematology

## 2022-09-17 NOTE — Progress Notes (Signed)
[place]   [phone] Fax:(336) [place] VISIT   Patient Care Team: Kathyrn Lass, MD as PCP - General (Family Medicine) Brunetta Genera, MD as PCP - Hematology/Oncology (Hematology) Wonda Horner, MD as Consulting Physician (Gastroenterology) Truitt Merle, MD as Consulting Physician (Hematology) Alla Feeling, NP as Nurse Practitioner (Nurse Practitioner) Kyung Rudd, MD as Consulting Physician (Radiation Oncology) Michael Boston, MD as Consulting Physician (General Surgery) Izora Gala, MD as Attending Physician (Otolaryngology)  Date of Service: 09/10/2022   CHIEF COMPLAINT: f/u of recently diagnosed T cell lymphoma and prior to cycle 4 of chemotherapy  SUMMARY OF ONCOLOGIC HISTORY: Oncology History Overview Note  Cancer Staging Anal cancer [facility]) Staging form: Anus, AJCC 8th Edition - Clinical stage from 06/28/2019: Stage IIA (cT2, cN0, cM0) - Signed by Alla Feeling, NP on 06/28/2019  Breast cancer of upper-outer quadrant of left female breast [facility]) Staging form: Breast, AJCC 7th Edition - Clinical stage from 09/07/2013: Stage IIA (T2, N0, cM0) - Signed by [name], MD on 09/07/2013 - Pathologic: Stage IIA (T2, N0, cM0) - Signed by [name], MD on 09/07/2013  Breast cancer of upper-outer quadrant of right female breast [facility]) Staging form: Breast, AJCC 7th Edition - Clinical stage from 09/07/2013: Stage IA (T1a, N0, cM0) - Signed by [name], MD on 09/07/2013 - Pathologic: Stage IA (T1a, N0, cM0) - Signed by [name], MD on 09/07/2013    Breast cancer of upper-outer quadrant of left female breast ([place])  07/10/2007 Procedure   US biopsy showed invasive ductal carcinoma 0.8cm, Nottingham grade 3   07/30/2007 Surgery   She had left breast lumpectomy and LN biopsy which showed high grade invasive ductal cancer Nottingham grade 3, 2.7 cm, ER/PR/Her2 neu negative   11/24/2007 Surgery   Patient  elected for bilateral mastectomy   09/07/2008 - 03/08/2009 Chemotherapy   dates are approximate: she received adriamycin and cytoxan followed by Taxol   03/08/2009 - 05/08/2009 Radiation Therapy   dates are approximate, she received XRT    07/09/2021 Imaging   CT CAP  IMPRESSION: 1. No evidence to suggest locally recurrent ianorectal neoplasm or metastatic disease in the chest, abdomen or pelvis. 2. Status post bilateral modified radical mastectomy and bilateral axillary lymph node dissection. 3. Three small supraumbilical ventral hernias containing only omental fat. No associated bowel incarceration or obstruction at this time. 4. Aortic atherosclerosis. 5. Additional incidental findings, as above.   Breast cancer of upper-outer quadrant of right female breast ([place])  07/10/2006 Procedure   stereotactic biopsy showed atypical hyperplasia   10/23/2006 Surgery   right lumpectomy showed invasive ductal ca (Nottingham grade 1)  74m and DCIS, ER/PR positive Her 2 negative   10/09/2007 - 10/08/2012 Chemotherapy   Patient was placed on Tamoxifen   03/24/2017 Imaging   No acute findings. No evidence of recurrent carcinoma or metastatic disease   07/09/2021 Imaging   CT CAP  IMPRESSION: 1. No evidence to suggest locally recurrent ianorectal neoplasm or metastatic disease in the chest, abdomen or pelvis. 2. Status post bilateral modified radical mastectomy and bilateral axillary lymph node dissection. 3. Three small supraumbilical ventral hernias containing only omental fat. No associated bowel incarceration or obstruction at this time. 4. Aortic atherosclerosis. 5. Additional incidental findings, as above.   Anal cancer ([place]  06/04/2019 Procedure   Colonoscopy per Dr. SAnson Fret Findings-the digital rectal exam revealed a 3 cm diameter firm rectal mass.  The mass was noncircumferential  and located predominantly at the right bowel wall at the anorectal junction. A nonobstructing  mass was found at the anus and in the rectum    06/04/2019 Initial Biopsy   Follow pathology: Large intestine, rectum biopsy: Invasive well to moderately differentiated squamous cell carcinoma.  No rectal mucosa present.  There is strong diffuse expression of P 16 immunostain.  CDX 2, p63 and mCEA immunostains are also used in the diagnostic work-up of the case.   06/04/2019 Initial Diagnosis   Anal cancer ([place])   06/21/2019 PET scan   IMPRESSION: 1. Anorectal primary. No hypermetabolic metastatic disease within the chest, abdomen, or pelvis. Perirectal nodes, at least 1 of which is new since 02/26/2018 CT, suspicious based on size and interval development. 2. Mild limitations secondary to beam hardening artifact from bilateral hip arthroplasty. 3.  Aortic Atherosclerosis (ICD10-I70.0).   06/28/2019 Cancer Staging   Staging form: Anus, AJCC 8th Edition - Clinical stage from 06/28/2019: Stage IIA (cT2, cN0, cM0) - Signed by Alla Feeling, NP on 06/28/2019   06/28/2019 - 07/26/2019 Chemotherapy   concurrent chemoRT with Mitomycin and 5FU on week 1 and week 5 on starting 06/28/19. Last dose on 07/26/19   06/28/2019 - 08/09/2019 Radiation Therapy   concurrent chemoRT with Dr [name] 06/28/19-08/09/19   11/01/2019 PET scan   IMPRESSION: 1. Marked interval decrease in hypermetabolism noted at the level of the anal rectal primary. No evidence for hypermetabolic metastatic disease in the chest, abdomen, or pelvis. The perirectal lymph nodes identified previously have resolved in the interval. 2.  Aortic Atherosclerois (ICD10-170.0)   08/07/2020 Imaging   CT CAP  IMPRESSION: 1. No findings for residual/recurrent anal tumor, regional lymphadenopathy or metastatic disease. 2. Status post cholecystectomy. No biliary dilatation. 3. Small anterior abdominal wall hernia containing fat. 4. Aortic atherosclerosis.   Aortic Atherosclerosis (ICD10-I70.0).     07/09/2021 Imaging   CT  CAP  IMPRESSION: 1. No evidence to suggest locally recurrent ianorectal neoplasm or metastatic disease in the chest, abdomen or pelvis. 2. Status post bilateral modified radical mastectomy and bilateral axillary lymph node dissection. 3. Three small supraumbilical ventral hernias containing only omental fat. No associated bowel incarceration or obstruction at this time. 4. Aortic atherosclerosis. 5. Additional incidental findings, as above.   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 for evaluation and management of newly diagnosed T-cell non-Hodgkin's lymphoma.  Patient has a history of bilateral breast cancer status post mastectomies.  -2007: s/p right lumpectomy for ADH in 08/2006 showed DCIS ER/PR+, grade 1. 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 She was adopted, no known family history    She subsequently was diagnosed with anal squamous cell carcinoma cT2 N0 M0 diagnosed in September 2020.Workup showed a 3 cm mass at the anorectal junction, biopsy confirmed well to moderately differentiated squamous cell carcinoma. 06/21/19 PET scan showed no metastasis.  -S/p concurrent chemoRT with Mitomycin and 5FU on 08/09/19. Now on  surveillance. -surveillance CT CAP 07/09/21 was NED. -surveillance colonoscopy done for rectal bleeding on 12/07/21 showed only radiation changes. Her rectal bleeding has resolved.  Recently patient presented to  her primary care physician on 05/23/2022 with her new right neck lump which has been progressively enlarging.  She also subsequently developed a left neck swelling.  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  She has had a PET CT scan done on 06/19/2022 which showed hypermetabolic bilateral neck lymphadenopathy.  Also noted to have a new 1 cm focus of metabolic activity in segment 4 of the liver.  This is etiology indeterminate. Lumbar scoliosis and small supraumbilical hernia.  Patient has had Port-A-Cath placement done on 06/25/2022. Echocardiogram was done on 06/17/2022 and shows normal ejection fraction of 70 to 75% with grade 1 diastolic dysfunction.  Right ventricular systolic function within normal limits.  INTERVAL HISTORY  Cynthia Velasquez is here today for evaluation and management of T-cell non-Hodgkin's lymphoma. She is here for follow-up prior to cycle 4 of CEOP. She has not had a PET CT scan yet but this is scheduled for 09/19/2021. She has seen Dr. Mickeal Skinner who feels that tingling and numbness in her fingers could be from chemotherapy or from carpal tunnel syndrome but likely from chemotherapy. Her vincristine was dose reduced for her last chemotherapy cycle and her neuropathy is stable.  She prefers to continue with the same dose of vincristine as opposed to holding this chemotherapy completely. No new infections or other new toxicities at this time No new lumps or bumps noted. No new abdominal pain or distention. Patient has received the influenza vaccine, COVID-19 Booster, and RSV vaccine.   ROS 10 Point review of Systems was done is negative except as noted above.  MEDICAL HISTORY:  Past Medical History:  Diagnosis Date   Anal cancer ([place]) dx'd 05/2019   Anxiety    Arthritis    Bilateral breast cancer ([place]) 10/21/2013   Breast cancer ([place])    Cancer ([place])     Depression    Gallstones    GERD (gastroesophageal reflux disease)    doesn't take any meds for this   Headache    h/o migraines       History of bladder infections    History of hiatal hernia    History of migraine    Hyperlipidemia    takes Simvastatin daily   Hypertension    takes Hyzaar   Joint pain    Joint swelling    Lumbar stenosis    Neuropathy 09/07/2013   Pneumonia    Pre-diabetes     SURGICAL HISTORY: Past Surgical History:  Procedure Laterality Date   ABDOMINAL HYSTERECTOMY     bone spur removed from left foot     CHOLECYSTECTOMY     COLONOSCOPY     COLONOSCOPY WITH PROPOFOL Bilateral 12/07/2021   Procedure: COLONOSCOPY WITH PROPOFOL;  Surgeon: Clarene Essex, MD;  Location: [facility];  Service: Endoscopy;  Laterality: Bilateral;   double mastectomy      ESOPHAGOGASTRODUODENOSCOPY N/A 12/07/2021   Procedure: ESOPHAGOGASTRODUODENOSCOPY (EGD);  Surgeon: Clarene Essex, MD;  Location: [facility];  Service: Endoscopy;  Laterality: N/A;   HERNIA REPAIR     umbilical   IR IMAGING GUIDED PORT INSERTION  06/25/2022   right knee arthroscopy     right shoulder arthroscopy     surgery for hiatal hernia     TONSILLECTOMY  TOTAL HIP ARTHROPLASTY Left 02/12/2013   TOTAL HIP ARTHROPLASTY Left 02/12/2013   Procedure: LEFT TOTAL HIP ARTHROPLASTY;  Surgeon: Kerin Salen, MD;  Location: [place];  Service: Orthopedics;  Laterality: Left;   TOTAL HIP ARTHROPLASTY Right 05/03/2013   Procedure: TOTAL HIP ARTHROPLASTY;  Surgeon: Kerin Salen, MD;  Location: [place];  Service: Orthopedics;  Laterality: Right;   TOTAL KNEE ARTHROPLASTY Right 02/27/2015   Procedure: TOTAL KNEE ARTHROPLASTY;  Surgeon: Frederik Pear, MD;  Location: [place];  Service: Orthopedics;  Laterality: Right;   TOTAL SHOULDER ARTHROPLASTY Right 09/12/2016   Procedure: RIGHT TOTAL SHOULDER ARTHROPLASTY;  Surgeon: Tania Ade, MD;  Location: [place];  Service: Orthopedics;  Laterality: Right;  RIGHT TOTAL SHOULDER  ARTHROPLASTY    I have reviewed the social history and family history with the patient and they are unchanged from previous note.  ALLERGIES:  is allergic to tramadol, codeine, and vicodin [hydrocodone-acetaminophen].  MEDICATIONS:  Current Outpatient Medications  Medication Sig Dispense Refill   Saccharomyces boulardii 500 MG PACK Take 500 mg by mouth 2 (two) times daily with a meal. 60 each 0   acyclovir (ZOVIRAX) 400 MG tablet Take 1 tablet (400 mg total) by mouth daily. 30 tablet 3   aspirin-acetaminophen-caffeine (EXCEDRIN MIGRAINE) 250-250-65 MG tablet Take 2 tablets by mouth every 6 (six) hours as needed for headache.     famotidine (PEPCID) 20 MG tablet Take 1 tablet (20 mg total) by mouth 2 (two) times daily. 30 tablet 0   HYDROcodone-acetaminophen (NORCO) 5-325 MG tablet Take 1 tablet by mouth every 6 (six) hours as needed for moderate pain. 30 tablet 0   lidocaine-prilocaine (EMLA) cream Apply to affected area once (Patient not taking: Reported on 08/20/2022) 30 g 3   loratadine (CLARITIN) 10 MG tablet TAKE 1 TABLET(10 MG) BY MOUTH DAILY 30 tablet 0   losartan (COZAAR) 100 MG tablet Take 100 mg by mouth daily.  6   Menthol-Methyl Salicylate (SALONPAS PAIN RELIEF PATCH EX) Apply 1 patch topically daily as needed (pain).     nystatin (MYCOSTATIN/NYSTOP) powder Apply 1 Application topically 3 (three) times daily. 15 g 0   ondansetron (ZOFRAN) 8 MG tablet Take 1 tablet (8 mg total) by mouth every 8 (eight) hours as needed for nausea or vomiting. Start on the third day after cyclophosphamide chemotherapy. 30 tablet 1   predniSONE (DELTASONE) 20 MG tablet Take 3 tablets (60 mg total) by mouth daily. Take for 2 days starting the day after chemotherapy on day 4 (Patient not taking: Reported on 09/03/2022) 30 tablet 0   prochlorperazine (COMPAZINE) 10 MG tablet Take 1 tablet (10 mg total) by mouth every 6 (six) hours as needed for nausea or vomiting. 30 tablet 6   senna-docusate (SENNA S)  8.6-50 MG tablet Take 2 tablets by mouth at bedtime. 60 tablet 1   No current facility-administered medications for this visit.    PHYSICAL EXAMINATION: .BP (!) 157/90 (BP Location: Left Arm, Patient Position: Sitting)   Pulse 85   Temp 99 F (37.2 C) (Temporal)   Resp 16   Ht _0  (1.651 m)   Wt 218 lb 14.4 oz (99.3 kg)   SpO2 98%   BMI 36.43 kg/m  .  NAD GENERAL:alert, in no acute distress and comfortable SKIN: no acute rashes, no significant lesions EYES: conjunctiva are pink and non-injected, sclera anicteric OROPHARYNX: MMM, no exudates, no oropharyngeal erythema or ulceration NECK: supple, no JVD LYMPH:  no palpable lymphadenopathy in the cervical, axillary  or inguinal regions LUNGS: clear to auscultation b/l with normal respiratory effort HEART: regular rate & rhythm ABDOMEN:  normoactive bowel sounds , non tender, not distended. Extremity: no pedal edema PSYCH: alert & oriented x 3 with fluent speech NEURO: no focal motor/sensory deficits   LABORATORY DATA:  I have reviewed the data as listed    Latest Ref Rng & Units 09/10/2022   12:27 PM 08/20/2022    9:13 AM 08/12/2022    9:02 AM  CBC  WBC 4.0 - 10.5 K/uL 3.8  3.9  5.7   Hemoglobin 12.0 - 15.0 g/dL 10.0  11.1  10.6   Hematocrit 36.0 - 46.0 % 31.3  34.6  32.8   Platelets 150 - 400 K/uL 257  346  160         Latest Ref Rng & Units 09/10/2022   12:27 PM 08/20/2022    9:13 AM 08/12/2022    9:02 AM  CMP  Glucose 70 - 99 mg/dL 104  116  107   BUN 8 - 23 mg/dL _0 Creatinine 0.44 - 1.00 mg/dL 0.95  0.99  1.14   Sodium 135 - 145 mmol/L 141  141  142   Potassium 3.5 - 5.1 mmol/L 3.4  4.1  3.8   Chloride 98 - 111 mmol/L 110  110  110   CO2 22 - 32 mmol/L _1 Calcium 8.9 - 10.3 mg/dL 9.0  9.3  9.1   Total Protein 6.5 - 8.1 g/dL 6.1  6.7  6.3   Total Bilirubin 0.3 - 1.2 mg/dL 0.3  0.4  0.3   Alkaline Phos 38 - 126 U/L 80  73  99   AST 15 - 41 U/L _2 ALT 0 - 44 U/L _3 . Lab Results  Component Value Date   LDH 147 09/10/2022           RADIOGRAPHIC STUDIES: .No results found.   ASSESSMENT & PLAN:  Cynthia Velasquez is a 72 y.o. female with   1.  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 Plan -Results of excisional lymph node biopsy showing peripheral T-cell lymphoma not otherwise specified which is CD30 negative were discussed in detail with the patient. PET CT scan was previously reviewed and she appears to have at least stage II disease but depending on the liver lesion etiology this could represent stage IV disease. She has had anthracycline exposure as a part of her AC regimen for previous treatment of breast cancer and is therefore limited in her total lifetime anthracycline use. CD 30 negative status precludes use of Adcentris. We recommended proceeding with treatment with CEOP and depending on response after 2-3 cycles we could consider using a dose reduced version of anthracyclines with Zinecard for cardiac protection. -We will start the patient on short burst of prednisone to help with decreasing some of her lymph node related pain in the neck pending start of chemotherapy. -Port-A-Cath placed Echo shows normal ejection fraction. -We will need to be mindful of any cytopenias that might arise given patient has had heavy previous treatment with chemotherapy for both her breast cancer as well as anal squamous cell carcinoma. We will dose reduce etoposide and Cytoxan for her first cycle of treatment and adjust accordingly. -Will need G-CSF support with treatment -We will plan to repeat PET CT scan after 3 cycles of treatment -  Chemo therapy orders placed and coordinated.  2. Anal Squamous Cell Carcinoma, cT2N0M0-currently in remission.  Details as noted above   3. 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 Completed tamoxifen 09/2007 -  09/2012 -2008: stage pT2N0 left breast invasive ductal carcinoma, triple negative, grade 3; s/p lumpectomy in 07/2007 and bilateral mastectomy 11/2007 (path negative for residual carcinoma in both breasts); s/p adjuvant chemo AC-T and radiation  Plan:  -Discussed lab results in detail with the patient today -CBC and CMP stable.  Mild anemia and mild hypokalemia. -Appreciate neurology input by Dr. Mickeal Skinner -Will continue with dose reduced vincristine for grade 1 neuropathy. -Patient has intermittent grade 1 diarrhea.  Stool workup and probiotics ordered. -PET CT scan is scheduled on 09/19/2022 -Patient appropriate to proceed with cycle 4 of CEOP with same supportive medications. -Further treatment adjustments per PET/CT results -  FOLLOW UP: Please schedule cycle 5 and 6 as per integrated scheduling  The total time spent in the appointment was 30 minutes*.  All of the patient's questions were answered with apparent satisfaction. The patient knows to call the clinic with any problems, questions or concerns.   Sullivan Lone MD MS AAHIVMS [facility] Hematology/Oncology Physician [facility]  .*Total Encounter Time as defined by the Centers for Medicare and Medicaid Services includes, in addition to the face-to-face time of a patient visit (documented in the note above) non-face-to-face time: obtaining and reviewing outside history, ordering and reviewing medications, tests or procedures, care coordination (communications with other health care professionals or caregivers) and documentation in the medical record.

## 2022-09-19 ENCOUNTER — Encounter: Payer: Self-pay | Admitting: Hematology

## 2022-09-19 ENCOUNTER — Ambulatory Visit (HOSPITAL_COMMUNITY)
Admission: RE | Admit: 2022-09-19 | Discharge: 2022-09-19 | Disposition: A | Discharge status: Home / Self Care | Payer: PPO | Source: Ambulatory Visit | Attending: Hematology | Admitting: Hematology

## 2022-09-19 DIAGNOSIS — J984 Other disorders of lung: Secondary | ICD-10-CM | POA: Diagnosis not present

## 2022-09-19 DIAGNOSIS — I2510 Atherosclerotic heart disease of native coronary artery without angina pectoris: Secondary | ICD-10-CM | POA: Diagnosis not present

## 2022-09-19 DIAGNOSIS — C8448 Peripheral T-cell lymphoma, not classified, lymph nodes of multiple sites: Secondary | ICD-10-CM | POA: Diagnosis not present

## 2022-09-19 DIAGNOSIS — I6523 Occlusion and stenosis of bilateral carotid arteries: Secondary | ICD-10-CM | POA: Diagnosis not present

## 2022-09-19 LAB — GLUCOSE, CAPILLARY: Glucose-Capillary: 108 mg/dL — ABNORMAL HIGH (ref 70–99)

## 2022-09-19 MED ORDER — FLUDEOXYGLUCOSE F - 18 (FDG) INJECTION
11.0000 | Freq: Once | INTRAVENOUS | Status: AC | PRN
  Administered 2022-09-19: 10.7 via INTRAVENOUS

## 2022-09-19 NOTE — Telephone Encounter (Signed)
Message left for Cynthia Velasquez, [phone] (home) to notify her this nurse noted (SW) H.I.M. releases reads record request has been completed on12/28/2023 by Rozelle Logan for [employer].  Provided (SW) H.I.M. phone number: [phone] opt. 2 for further information if needed.

## 2022-09-23 ENCOUNTER — Encounter: Payer: Self-pay | Admitting: Internal Medicine

## 2022-09-23 ENCOUNTER — Other Ambulatory Visit: Payer: Self-pay | Admitting: Internal Medicine

## 2022-09-23 MED ORDER — BACLOFEN 10 MG PO TABS: 10.0000 mg | ORAL_TABLET | Freq: Three times a day (TID) | ORAL | 0 refills | Status: DC | PRN

## 2022-09-25 ENCOUNTER — Encounter: Payer: Self-pay | Admitting: Hematology

## 2022-09-25 ENCOUNTER — Telehealth: Payer: Self-pay | Admitting: *Deleted

## 2022-09-25 NOTE — Telephone Encounter (Signed)
Collaborative reports [employer] received fax cover sheet only per [name].   Message left requesting return, unable to connect with (SW) H.I.M. [phone] opt 6) representative on behalf of patient for clarification of H.I.M. releases.   Search reads records sent to [facility] on 09/12/2022 have been completed by [facility].   [name] notified of above, this nurse will notify of (SW) H.I.M response upon receipt.   "I have called (SW) H.I.M. times six today after [employer] advised to continue because they have denied my claim."   No further questions or needs.  EPIC used by this nurse to route 07/22/2022 to present [facility] office notes only to perhaps reopen claim awaiting records from H.I.M. for service recovery.

## 2022-09-30 ENCOUNTER — Other Ambulatory Visit: Payer: Self-pay

## 2022-09-30 DIAGNOSIS — C8448 Peripheral T-cell lymphoma, not classified, lymph nodes of multiple sites: Secondary | ICD-10-CM

## 2022-10-01 ENCOUNTER — Inpatient Hospital Stay: Payer: PPO | Attending: Hematology | Admitting: Hematology

## 2022-10-01 ENCOUNTER — Other Ambulatory Visit: Payer: Self-pay | Admitting: Hematology

## 2022-10-01 ENCOUNTER — Inpatient Hospital Stay: Payer: PPO

## 2022-10-01 VITALS — BP 148/88 | HR 84 | Temp 97.9°F | Resp 18 | Wt 214.5 lb

## 2022-10-01 DIAGNOSIS — Z95828 Presence of other vascular implants and grafts: Secondary | ICD-10-CM | POA: Insufficient documentation

## 2022-10-01 DIAGNOSIS — C50411 Malignant neoplasm of upper-outer quadrant of right female breast: Secondary | ICD-10-CM

## 2022-10-01 DIAGNOSIS — C8441 Peripheral T-cell lymphoma, not classified, lymph nodes of head, face, and neck: Secondary | ICD-10-CM | POA: Diagnosis not present

## 2022-10-01 DIAGNOSIS — Z5111 Encounter for antineoplastic chemotherapy: Secondary | ICD-10-CM | POA: Insufficient documentation

## 2022-10-01 DIAGNOSIS — C8448 Peripheral T-cell lymphoma, not classified, lymph nodes of multiple sites: Secondary | ICD-10-CM

## 2022-10-01 DIAGNOSIS — C50412 Malignant neoplasm of upper-outer quadrant of left female breast: Secondary | ICD-10-CM

## 2022-10-01 LAB — CBC WITH DIFFERENTIAL (CANCER CENTER ONLY)
Abs Immature Granulocytes: 0.01 10*3/uL (ref 0.00–0.07)
Basophils Absolute: 0 10*3/uL (ref 0.0–0.1)
Basophils Relative: 0 %
Eosinophils Absolute: 0 10*3/uL (ref 0.0–0.5)
Eosinophils Relative: 0 %
HCT: 33.2 % — ABNORMAL LOW (ref 36.0–46.0)
Hemoglobin: 10.6 g/dL — ABNORMAL LOW (ref 12.0–15.0)
Immature Granulocytes: 0 %
Lymphocytes Relative: 23 %
Lymphs Abs: 1.1 10*3/uL (ref 0.7–4.0)
MCH: 28.6 pg (ref 26.0–34.0)
MCHC: 31.9 g/dL (ref 30.0–36.0)
MCV: 89.5 fL (ref 80.0–100.0)
Monocytes Absolute: 0.6 10*3/uL (ref 0.1–1.0)
Monocytes Relative: 12 %
Neutro Abs: 3 10*3/uL (ref 1.7–7.7)
Neutrophils Relative %: 65 %
Platelet Count: 255 10*3/uL (ref 150–400)
RBC: 3.71 MIL/uL — ABNORMAL LOW (ref 3.87–5.11)
RDW: 17.2 % — ABNORMAL HIGH (ref 11.5–15.5)
WBC Count: 4.6 10*3/uL (ref 4.0–10.5)
nRBC: 0 % (ref 0.0–0.2)

## 2022-10-01 LAB — CMP (CANCER CENTER ONLY)
ALT: 9 U/L (ref 0–44)
AST: 12 U/L — ABNORMAL LOW (ref 15–41)
Albumin: 3.5 g/dL (ref 3.5–5.0)
Alkaline Phosphatase: 84 U/L (ref 38–126)
Anion gap: 7 (ref 5–15)
BUN: 16 mg/dL (ref 8–23)
CO2: 25 mmol/L (ref 22–32)
Calcium: 9.2 mg/dL (ref 8.9–10.3)
Chloride: 108 mmol/L (ref 98–111)
Creatinine: 0.9 mg/dL (ref 0.44–1.00)
GFR, Estimated: >60 mL/min (ref >60)
Glucose, Bld: 97 mg/dL (ref 70–99)
Potassium: 3.8 mmol/L (ref 3.5–5.1)
Sodium: 140 mmol/L (ref 135–145)
Total Bilirubin: 0.3 mg/dL (ref 0.3–1.2)
Total Protein: 6.7 g/dL (ref 6.5–8.1)

## 2022-10-01 LAB — LACTATE DEHYDROGENASE: LDH: 141 U/L (ref 98–192)

## 2022-10-01 MED ORDER — ACETAMINOPHEN 325 MG PO TABS
650.0000 mg | ORAL_TABLET | Freq: Once | ORAL | Status: AC
  Administered 2022-10-01: 650 mg via ORAL
  Filled 2022-10-01: qty 2

## 2022-10-01 MED ORDER — FAMOTIDINE IN NACL 20-0.9 MG/50ML-% IV SOLN
20.0000 mg | Freq: Once | INTRAVENOUS | Status: AC
  Administered 2022-10-01: 20 mg via INTRAVENOUS
  Filled 2022-10-01: qty 50

## 2022-10-01 MED ORDER — HEPARIN SOD (PORK) LOCK FLUSH 100 UNIT/ML IV SOLN
500.0000 [IU] | Freq: Once | INTRAVENOUS | Status: AC | PRN
  Administered 2022-10-01: 500 [IU]

## 2022-10-01 MED ORDER — SODIUM CHLORIDE 0.9 % IV SOLN: Freq: Once | INTRAVENOUS | Status: AC

## 2022-10-01 MED ORDER — SODIUM CHLORIDE 0.9% FLUSH
10.0000 mL | Freq: Once | INTRAVENOUS | Status: AC
  Administered 2022-10-01: 10 mL

## 2022-10-01 MED ORDER — PALONOSETRON HCL INJECTION 0.25 MG/5ML
0.2500 mg | Freq: Once | INTRAVENOUS | Status: AC
  Administered 2022-10-01: 0.25 mg via INTRAVENOUS
  Filled 2022-10-01: qty 5

## 2022-10-01 MED ORDER — SODIUM CHLORIDE 0.9% FLUSH
10.0000 mL | INTRAVENOUS | Status: DC | PRN
  Administered 2022-10-01: 10 mL

## 2022-10-01 MED ORDER — SODIUM CHLORIDE 0.9 % IV SOLN
150.0000 mg | Freq: Once | INTRAVENOUS | Status: AC
  Administered 2022-10-01: 150 mg via INTRAVENOUS
  Filled 2022-10-01: qty 150

## 2022-10-01 MED ORDER — SODIUM CHLORIDE 0.9 % IV SOLN
80.0000 mg/m2 | Freq: Once | INTRAVENOUS | Status: AC
  Administered 2022-10-01: 170 mg via INTRAVENOUS
  Filled 2022-10-01: qty 8.5

## 2022-10-01 MED ORDER — SODIUM CHLORIDE 0.9 % IV SOLN
10.0000 mg | Freq: Once | INTRAVENOUS | Status: AC
  Administered 2022-10-01: 10 mg via INTRAVENOUS
  Filled 2022-10-01: qty 10

## 2022-10-01 MED ORDER — SODIUM CHLORIDE 0.9 % IV SOLN
600.0000 mg/m2 | Freq: Once | INTRAVENOUS | Status: AC
  Administered 2022-10-01: 1280 mg via INTRAVENOUS
  Filled 2022-10-01: qty 64

## 2022-10-01 MED ORDER — VINCRISTINE SULFATE CHEMO INJECTION 1 MG/ML
1.0000 mg | Freq: Once | INTRAVENOUS | Status: AC
  Administered 2022-10-01: 1 mg via INTRAVENOUS
  Filled 2022-10-01: qty 1

## 2022-10-01 NOTE — Progress Notes (Signed)
Patient seen by MD today  Vitals are within treatment parameters.  Labs reviewed: and are within treatment parameters."  Per physician team, patient is ready for treatment and there are NO modifications to the treatment plan.

## 2022-10-01 NOTE — Patient Instructions (Signed)
[place] ONCOLOGY  Discharge Instructions: Thank you for choosing [facility] to provide your oncology and hematology care.   If you have a lab appointment with the [facility], please go directly to the [facility] and check in at the registration area.   Wear comfortable clothing and clothing appropriate for easy access to any Portacath or PICC line.   We strive to give you quality time with your provider. You may need to reschedule your appointment if you arrive late (15 or more minutes).  Arriving late affects you and other patients whose appointments are after yours.  Also, if you miss three or more appointments without notifying the office, you may be dismissed from the clinic at the provider's discretion.      For prescription refill requests, have your pharmacy contact our office and allow 72 hours for refills to be completed.    Today you received the following chemotherapy and/or immunotherapy agents cytoxan, vincristine, etoposide      To help prevent nausea and vomiting after your treatment, we encourage you to take your nausea medication as directed.  BELOW ARE SYMPTOMS THAT SHOULD BE REPORTED IMMEDIATELY: *FEVER GREATER THAN 100.4 F (38 C) OR HIGHER *CHILLS OR SWEATING *NAUSEA AND VOMITING THAT IS NOT CONTROLLED WITH YOUR NAUSEA MEDICATION *UNUSUAL SHORTNESS OF BREATH *UNUSUAL BRUISING OR BLEEDING *URINARY PROBLEMS (pain or burning when urinating, or frequent urination) *BOWEL PROBLEMS (unusual diarrhea, constipation, pain near the anus) TENDERNESS IN MOUTH AND THROAT WITH OR WITHOUT PRESENCE OF ULCERS (sore throat, sores in mouth, or a toothache) UNUSUAL RASH, SWELLING OR PAIN  UNUSUAL VAGINAL DISCHARGE OR ITCHING   Items with * indicate a potential emergency and should be followed up as soon as possible or go to the Emergency Department if any problems should occur.  Please show the CHEMOTHERAPY ALERT CARD or IMMUNOTHERAPY ALERT  CARD at check-in to the Emergency Department and triage nurse.  Should you have questions after your visit or need to cancel or reschedule your appointment, please contact [facility]  Dept: [phone]  and follow the prompts.  Office hours are 8:00 a.m. to 4:30 p.m. Monday - Friday. Please note that voicemails left after 4:00 p.m. may not be returned until the following business day.  We are closed weekends and major holidays. You have access to a nurse at all times for urgent questions. Please call the main number to the clinic Dept: [phone] and follow the prompts.   For any non-urgent questions, you may also contact your provider using MyChart. We now offer e-Visits for anyone 29 and older to request care online for non-urgent symptoms. For details visit [email or web address].   Also download the MyChart app! Go to the app store, search "MyChart", open the app, select [facility], and log in with your MyChart username and password.

## 2022-10-01 NOTE — Progress Notes (Signed)
[place]   [phone] Fax:([phone]) [place] VISIT   Patient Care Team: Kathyrn Lass, MD as PCP - General (Family Medicine) Brunetta Genera, MD as PCP - Hematology/Oncology (Hematology) Wonda Horner, MD as Consulting Physician (Gastroenterology) Truitt Merle, MD as Consulting Physician (Hematology) Alla Feeling, NP as Nurse Practitioner (Nurse Practitioner) Kyung Rudd, MD as Consulting Physician (Radiation Oncology) Michael Boston, MD as Consulting Physician (General Surgery) Izora Gala, MD as Attending Physician (Otolaryngology)  Date of Service: 10/01/22    CHIEF COMPLAINT: f/u of recently diagnosed T cell lymphoma and prior to cycle 4 of chemotherapy  SUMMARY OF ONCOLOGIC HISTORY: Oncology History Overview Note  Cancer Staging Anal cancer [facility]) Staging form: Anus, AJCC 8th Edition - Clinical stage from 06/28/2019: Stage IIA (cT2, cN0, cM0) - Signed by Alla Feeling, NP on 06/28/2019  Breast cancer of upper-outer quadrant of left female breast [facility]) Staging form: Breast, AJCC 7th Edition - Clinical stage from 09/07/2013: Stage IIA (T2, N0, cM0) - Signed by [name], MD on 09/07/2013 - Pathologic: Stage IIA (T2, N0, cM0) - Signed by [name], MD on 09/07/2013  Breast cancer of upper-outer quadrant of right female breast [facility]) Staging form: Breast, AJCC 7th Edition - Clinical stage from 09/07/2013: Stage IA (T1a, N0, cM0) - Signed by [name], MD on 09/07/2013 - Pathologic: Stage IA (T1a, N0, cM0) - Signed by [name], MD on 09/07/2013    Breast cancer of upper-outer quadrant of left female breast ([place])  07/10/2007 Procedure   US biopsy showed invasive ductal carcinoma 0.8cm, Nottingham grade 3   07/30/2007 Surgery   She had left breast lumpectomy and LN biopsy which showed high grade invasive ductal cancer Nottingham grade 3, 2.7 cm, ER/PR/Her2 neu negative   11/24/2007 Surgery   Patient  elected for bilateral mastectomy   09/07/2008 - 03/08/2009 Chemotherapy   dates are approximate: she received adriamycin and cytoxan followed by Taxol   03/08/2009 - 05/08/2009 Radiation Therapy   dates are approximate, she received XRT    07/09/2021 Imaging   CT CAP  IMPRESSION: 1. No evidence to suggest locally recurrent ianorectal neoplasm or metastatic disease in the chest, abdomen or pelvis. 2. Status post bilateral modified radical mastectomy and bilateral axillary lymph node dissection. 3. Three small supraumbilical ventral hernias containing only omental fat. No associated bowel incarceration or obstruction at this time. 4. Aortic atherosclerosis. 5. Additional incidental findings, as above.   Breast cancer of upper-outer quadrant of right female breast ([place])  07/10/2006 Procedure   stereotactic biopsy showed atypical hyperplasia   10/23/2006 Surgery   right lumpectomy showed invasive ductal ca (Nottingham grade 1)  33m and DCIS, ER/PR positive Her 2 negative   10/09/2007 - 10/08/2012 Chemotherapy   Patient was placed on Tamoxifen   03/24/2017 Imaging   No acute findings. No evidence of recurrent carcinoma or metastatic disease   07/09/2021 Imaging   CT CAP  IMPRESSION: 1. No evidence to suggest locally recurrent ianorectal neoplasm or metastatic disease in the chest, abdomen or pelvis. 2. Status post bilateral modified radical mastectomy and bilateral axillary lymph node dissection. 3. Three small supraumbilical ventral hernias containing only omental fat. No associated bowel incarceration or obstruction at this time. 4. Aortic atherosclerosis. 5. Additional incidental findings, as above.   Anal cancer ([place]  06/04/2019 Procedure   Colonoscopy per Dr. SAnson Fret Findings-the digital rectal exam revealed a 3 cm diameter firm rectal mass.  The mass was  noncircumferential and located predominantly at the right bowel wall at the anorectal junction. A nonobstructing  mass was found at the anus and in the rectum    06/04/2019 Initial Biopsy   Follow pathology: Large intestine, rectum biopsy: Invasive well to moderately differentiated squamous cell carcinoma.  No rectal mucosa present.  There is strong diffuse expression of P 16 immunostain.  CDX 2, p63 and mCEA immunostains are also used in the diagnostic work-up of the case.   06/04/2019 Initial Diagnosis   Anal cancer ([place])   06/21/2019 PET scan   IMPRESSION: 1. Anorectal primary. No hypermetabolic metastatic disease within the chest, abdomen, or pelvis. Perirectal nodes, at least 1 of which is new since 02/26/2018 CT, suspicious based on size and interval development. 2. Mild limitations secondary to beam hardening artifact from bilateral hip arthroplasty. 3.  Aortic Atherosclerosis (ICD10-I70.0).   06/28/2019 Cancer Staging   Staging form: Anus, AJCC 8th Edition - Clinical stage from 06/28/2019: Stage IIA (cT2, cN0, cM0) - Signed by Alla Feeling, NP on 06/28/2019   06/28/2019 - 07/26/2019 Chemotherapy   concurrent chemoRT with Mitomycin and 5FU on week 1 and week 5 on starting 06/28/19. Last dose on 07/26/19   06/28/2019 - 08/09/2019 Radiation Therapy   concurrent chemoRT with Dr [name] 06/28/19-08/09/19   11/01/2019 PET scan   IMPRESSION: 1. Marked interval decrease in hypermetabolism noted at the level of the anal rectal primary. No evidence for hypermetabolic metastatic disease in the chest, abdomen, or pelvis. The perirectal lymph nodes identified previously have resolved in the interval. 2.  Aortic Atherosclerois (ICD10-170.0)   08/07/2020 Imaging   CT CAP  IMPRESSION: 1. No findings for residual/recurrent anal tumor, regional lymphadenopathy or metastatic disease. 2. Status post cholecystectomy. No biliary dilatation. 3. Small anterior abdominal wall hernia containing fat. 4. Aortic atherosclerosis.   Aortic Atherosclerosis (ICD10-I70.0).     07/09/2021 Imaging   CT  CAP  IMPRESSION: 1. No evidence to suggest locally recurrent ianorectal neoplasm or metastatic disease in the chest, abdomen or pelvis. 2. Status post bilateral modified radical mastectomy and bilateral axillary lymph node dissection. 3. Three small supraumbilical ventral hernias containing only omental fat. No associated bowel incarceration or obstruction at this time. 4. Aortic atherosclerosis. 5. Additional incidental findings, as above.   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)   07/08/2022 -  Chemotherapy   Patient is on Treatment Plan : [place] q21d        HPI  Cynthia Velasquez has been referred to [name] by [name] for evaluation and management of newly diagnosed T-cell non-Hodgkin's lymphoma.  Patient has a history of bilateral breast cancer status post mastectomies.  -2007: s/p right lumpectomy for ADH in 08/2006 showed DCIS ER/PR+, grade 1. 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 She was adopted, no known family history    She subsequently was diagnosed with anal squamous cell carcinoma cT2 N0 M0 diagnosed in September 2020.Workup showed a 3 cm mass at the anorectal junction, biopsy confirmed well to moderately differentiated squamous cell carcinoma. 06/21/19 PET scan showed no metastasis.  -S/p concurrent chemoRT with Mitomycin and 5FU on 08/09/19. Now  on surveillance. -surveillance CT CAP 07/09/21 was NED. -surveillance colonoscopy done for rectal bleeding on 12/07/21 showed only radiation changes. Her rectal bleeding has resolved.  Recently patient presented to  her primary care physician on 05/23/2022 with her new right neck lump which has been progressively enlarging.  She also subsequently developed a left neck swelling.  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  She has had a PET CT scan done on 06/19/2022 which showed hypermetabolic bilateral neck lymphadenopathy.  Also noted to have a new 1 cm focus of metabolic activity in segment 4 of the liver.  This is etiology indeterminate. Lumbar scoliosis and small supraumbilical hernia.  Patient has had Port-A-Cath placement done on 06/25/2022. Echocardiogram was done on 06/17/2022 and shows normal ejection fraction of 70 to 75% with grade 1 diastolic dysfunction.  Right ventricular systolic function within normal limits.  INTERVAL HISTORY  Monaca Wadas is here today for evaluation and management of T-cell non-Hodgkin's lymphoma. She is here for follow-up prior to cycle 5 of CEOP.  Patient was last seen by me on 09/10/2022 and she was doing well overall. Her Vincristine dose was reduced which improved her neuropathy.   Patient notes she has been doing well overall without any medical concerns since our last visit. She denies any adverse toxicities with her treatment. However, she complains of worsening neuropathy in her hallux of bilateral legs and bilateral hands. She also complains of intermittent left upper abdominal pain, which began after her PET scan. She denies tenderness or pain upon touching.   She complains that her urination problem has been worsening. She notes that she does not have a strong sensation when she needs to urinate. Patient has seen urologist previously, but does not recall the name of Urologist.   Patient also complains of occasional abnormal bowel movement. She reports she has been losing sensation of bowel movement, but she has more control of bowel movement than  urination control.   She denies fever, chills, night sweats, chest pain, or leg swelling.   Patient is complaint with her medication. She is regularly taking B-complex supplement.   ROS 10 Point review of Systems was done is negative except as noted above.  MEDICAL HISTORY:  Past Medical History:  Diagnosis Date   Anal cancer ([place]) dx'd 05/2019   Anxiety    Arthritis    Bilateral breast cancer ([place]) 10/21/2013   Breast cancer ([place])    Cancer ([place])    Depression    Gallstones    GERD (gastroesophageal reflux disease)    doesn't take any meds for this   Headache    h/o migraines       History of bladder infections    History of hiatal hernia    History of migraine    Hyperlipidemia    takes Simvastatin daily   Hypertension    takes Hyzaar   Joint pain    Joint swelling    Lumbar stenosis    Neuropathy 09/07/2013   Pneumonia    Pre-diabetes     SURGICAL HISTORY: Past Surgical History:  Procedure Laterality Date   ABDOMINAL HYSTERECTOMY     bone spur removed from left foot     CHOLECYSTECTOMY     COLONOSCOPY     COLONOSCOPY WITH PROPOFOL Bilateral 12/07/2021   Procedure: COLONOSCOPY WITH PROPOFOL;  Surgeon: Clarene Essex, MD;  Location: [facility];  Service: Endoscopy;  Laterality:  Bilateral;   double mastectomy      ESOPHAGOGASTRODUODENOSCOPY N/A 12/07/2021   Procedure: ESOPHAGOGASTRODUODENOSCOPY (EGD);  Surgeon: Clarene Essex, MD;  Location: [facility];  Service: Endoscopy;  Laterality: N/A;   HERNIA REPAIR     umbilical   IR IMAGING GUIDED PORT INSERTION  06/25/2022   right knee arthroscopy     right shoulder arthroscopy     surgery for hiatal hernia     TONSILLECTOMY     TOTAL HIP ARTHROPLASTY Left 02/12/2013   TOTAL HIP ARTHROPLASTY Left 02/12/2013   Procedure: LEFT TOTAL HIP ARTHROPLASTY;  Surgeon: Kerin Salen, MD;  Location: [place];  Service: Orthopedics;  Laterality: Left;   TOTAL HIP ARTHROPLASTY Right 05/03/2013   Procedure: TOTAL HIP ARTHROPLASTY;   Surgeon: Kerin Salen, MD;  Location: [place];  Service: Orthopedics;  Laterality: Right;   TOTAL KNEE ARTHROPLASTY Right 02/27/2015   Procedure: TOTAL KNEE ARTHROPLASTY;  Surgeon: Frederik Pear, MD;  Location: [place];  Service: Orthopedics;  Laterality: Right;   TOTAL SHOULDER ARTHROPLASTY Right 09/12/2016   Procedure: RIGHT TOTAL SHOULDER ARTHROPLASTY;  Surgeon: Tania Ade, MD;  Location: [place];  Service: Orthopedics;  Laterality: Right;  RIGHT TOTAL SHOULDER ARTHROPLASTY    I have reviewed the social history and family history with the patient and they are unchanged from previous note.  ALLERGIES:  is allergic to tramadol, codeine, and vicodin [hydrocodone-acetaminophen].  MEDICATIONS:  Current Outpatient Medications  Medication Sig Dispense Refill   acyclovir (ZOVIRAX) 400 MG tablet Take 1 tablet (400 mg total) by mouth daily. 30 tablet 3   aspirin-acetaminophen-caffeine (EXCEDRIN MIGRAINE) 250-250-65 MG tablet Take 2 tablets by mouth every 6 (six) hours as needed for headache.     baclofen (LIORESAL) 10 MG tablet Take 1 tablet (10 mg total) by mouth 3 (three) times daily as needed for muscle spasms. 30 each 0   famotidine (PEPCID) 20 MG tablet Take 1 tablet (20 mg total) by mouth 2 (two) times daily. 30 tablet 0   HYDROcodone-acetaminophen (NORCO) 5-325 MG tablet Take 1 tablet by mouth every 6 (six) hours as needed for moderate pain. 30 tablet 0   lidocaine-prilocaine (EMLA) cream Apply to affected area once (Patient not taking: Reported on 08/20/2022) 30 g 3   loratadine (CLARITIN) 10 MG tablet TAKE 1 TABLET(10 MG) BY MOUTH DAILY 30 tablet 0   losartan (COZAAR) 100 MG tablet Take 100 mg by mouth daily.  6   Menthol-Methyl Salicylate (SALONPAS PAIN RELIEF PATCH EX) Apply 1 patch topically daily as needed (pain).     nystatin (MYCOSTATIN/NYSTOP) powder Apply 1 Application topically 3 (three) times daily. 15 g 0   ondansetron (ZOFRAN) 8 MG tablet Take 1 tablet (8 mg total) by mouth every 8  (eight) hours as needed for nausea or vomiting. Start on the third day after cyclophosphamide chemotherapy. 30 tablet 1   predniSONE (DELTASONE) 20 MG tablet Take 3 tablets (60 mg total) by mouth daily. Take for 2 days starting the day after chemotherapy on day 4 (Patient not taking: Reported on 09/03/2022) 30 tablet 0   prochlorperazine (COMPAZINE) 10 MG tablet Take 1 tablet (10 mg total) by mouth every 6 (six) hours as needed for nausea or vomiting. 30 tablet 6   Saccharomyces boulardii 500 MG PACK Take 500 mg by mouth 2 (two) times daily with a meal. 60 each 0   senna-docusate (SENNA S) 8.6-50 MG tablet Take 2 tablets by mouth at bedtime. 60 tablet 1   No current facility-administered medications  for this visit.    PHYSICAL EXAMINATION: .BP (!) 148/88   Pulse 84   Temp 97.9 F (36.6 C)   Resp 18   Wt 214 lb 8 oz (97.3 kg)   SpO2 97%   BMI 35.69 kg/m  .  NAD GENERAL:alert, in no acute distress and comfortable SKIN: no acute rashes, no significant lesions EYES: conjunctiva are pink and non-injected, sclera anicteric OROPHARYNX: MMM, no exudates, no oropharyngeal erythema or ulceration NECK: supple, no JVD LYMPH:  no palpable lymphadenopathy in the cervical, axillary or inguinal regions LUNGS: clear to auscultation b/l with normal respiratory effort HEART: regular rate & rhythm ABDOMEN:  normoactive bowel sounds , non tender, not distended. Extremity: no pedal edema PSYCH: alert & oriented x 3 with fluent speech NEURO: no focal motor/sensory deficits   LABORATORY DATA:  I have reviewed the data as listed    Latest Ref Rng & Units 10/01/2022   11:03 AM 09/10/2022   12:27 PM 08/20/2022    9:13 AM  CBC  WBC 4.0 - 10.5 K/uL 4.6  3.8  3.9   Hemoglobin 12.0 - 15.0 g/dL 10.6  10.0  11.1   Hematocrit 36.0 - 46.0 % 33.2  31.3  34.6   Platelets 150 - 400 K/uL 255  257  346         Latest Ref Rng & Units 10/01/2022   11:03 AM 09/10/2022   12:27 PM 08/20/2022    9:13 AM  CMP   Glucose 70 - 99 mg/dL 97  104  116   BUN 8 - 23 mg/dL '16  17  13   '$ Creatinine 0.44 - 1.00 mg/dL 0.90  0.95  0.99   Sodium 135 - 145 mmol/L 140  141  141   Potassium 3.5 - 5.1 mmol/L 3.8  3.4  4.1   Chloride 98 - 111 mmol/L 108  110  110   CO2 22 - 32 mmol/L '25  26  25   '$ Calcium 8.9 - 10.3 mg/dL 9.2  9.0  9.3   Total Protein 6.5 - 8.1 g/dL 6.7  6.1  6.7   Total Bilirubin 0.3 - 1.2 mg/dL 0.3  0.3  0.4   Alkaline Phos 38 - 126 U/L 84  80  73   AST 15 - 41 U/L '12  14  16   '$ ALT 0 - 44 U/L '9  10  11    '$ . Lab Results  Component Value Date   LDH 141 10/01/2022           RADIOGRAPHIC STUDIES: .NM PET Image Restag (PS) Skull Base To Thigh  Result Date: 09/20/2022 CLINICAL DATA:  Subsequent treatment strategy for T-cell lymphoma. EXAM: NUCLEAR MEDICINE PET SKULL BASE TO THIGH TECHNIQUE: 10.7 mCi F-18 FDG was injected intravenously. Full-ring PET imaging was performed from the skull base to thigh after the radiotracer. CT data was obtained and used for attenuation correction and anatomic localization. Fasting blood glucose: 108 mg/dl COMPARISON:  PET-CT 06/19/2022 and 11/01/2019. Abdominopelvic CT 08/07/2022. FINDINGS: Mediastinal blood pool activity: SUV max 1.2 Liver activity: SUV max 2.2 NECK: Previously demonstrated bilateral cervical hypermetabolic adenopathy has resolved. No suspicious activity identified within the pharyngeal mucosal space. Incidental CT findings: New small amount of layering fluid in both maxillary sinuses without associated hypermetabolic activity. Mild bilateral carotid atherosclerosis. CHEST: There are no hypermetabolic mediastinal, hilar or axillary lymph nodes. No hypermetabolic pulmonary activity or suspicious nodularity. Incidental CT findings: Right IJ Port-A-Cath extends to the superior cavoatrial junction. Mild  atherosclerosis of the aorta, great vessels and coronary arteries. Similar mild scarring at the left lung base. ABDOMEN/PELVIS: No definite residual focal  hypermetabolic activity is seen within the liver. There is no hypermetabolic activity within the spleen, pancreas or adrenal glands. There is no hypermetabolic nodal activity in the abdomen or pelvis. Incidental CT findings: Previous cholecystectomy. Mild aortoiliac atherosclerosis. There is a stable small umbilical hernia containing only fat. Contamination in the perineal region noted SKELETON: New diffuse hypermetabolic activity throughout the bones appears nonfocal and is likely treatment related. No osseous activity suspicious for lymphomatous involvement. There is new linear activity projecting over the right deltoid muscle which is likely humeral activity due to motion and misregistration. Incidental CT findings: Status post bilateral mastectomy. Previous right total shoulder and bilateral total hip arthroplasties. Convex right lumbar scoliosis. IMPRESSION: 1. Interval resolution of previously demonstrated hypermetabolic cervical adenopathy consistent with response to therapy ([place] 1). No definite residual hepatic abnormality identified. 2. No evidence of progressive lymphoma. 3. New diffuse hypermetabolic activity throughout the bone marrow, likely treatment related. 4. New small amount of layering fluid in both maxillary sinuses without associated hypermetabolic activity. 5.  Aortic Atherosclerosis (ICD10-I70.0). Electronically Signed   By: Richardean Sale M.D.   On: 09/20/2022 11:44     ASSESSMENT & PLAN:  Cynthia Velasquez is a 72 y.o. female with   1.  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 Plan -Results of excisional lymph node biopsy showing peripheral T-cell lymphoma not otherwise specified which is CD30 negative were discussed in detail with the patient. PET CT scan was previously reviewed and she appears to have at least stage II disease but depending on the liver lesion etiology this could represent stage IV disease. She has had anthracycline exposure as a part of  her AC regimen for previous treatment of breast cancer and is therefore limited in her total lifetime anthracycline use. CD 30 negative status precludes use of Adcentris. We recommended proceeding with treatment with CEOP and depending on response after 2-3 cycles we could consider using a dose reduced version of anthracyclines with Zinecard for cardiac protection. -We will start the patient on short burst of prednisone to help with decreasing some of her lymph node related pain in the neck pending start of chemotherapy. -Port-A-Cath placed Echo shows normal ejection fraction. -We will need to be mindful of any cytopenias that might arise given patient has had heavy previous treatment with chemotherapy for both her breast cancer as well as anal squamous cell carcinoma. We will dose reduce etoposide and Cytoxan for her first cycle of treatment and adjust accordingly. -Will need G-CSF support with treatment -We will plan to repeat PET CT scan after 3 cycles of treatment -Chemo therapy orders placed and coordinated.  2. Anal Squamous Cell Carcinoma, cT2N0M0-currently in remission.  Details as noted above   3. 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 Completed tamoxifen 09/2007 - 09/2012 -2008: stage pT2N0 left breast invasive ductal carcinoma, triple negative, grade 3; s/p lumpectomy in 07/2007 and bilateral mastectomy 11/2007 (path negative for residual carcinoma in both breasts); s/p adjuvant chemo AC-T and radiation  Plan:  -Discussed the lab results from today, 10/01/2022, with the patient. CBC shows hemoglobin of 10.6 K and hematocrit of 33.2. CMP stable -discussed the recent PET scan results with the patient. Discussed she is in radiographic remission.  -educated the patient that the recurrence of her T-Cell lymphoma is  high for the first 2 years.  -Discussed the next options once she finishes her treatment. 1.  Refer to [facility] for discussion of consolidative treatment option to reduce the risk of recurrence. 2. Just keep a watch over it and not refer to the [facility]. Patient wants to proceed with option 1.  -Patient has been tolerating her treatment well without any toxicities. She can proceed with her C5without any modifications.  -Refer patient to Dr. Cassell Clement or Dr. Jolayne Haines for discussion of consolidative treatment option at [facility]  -Refer patient to Urologist.    FOLLOW UP: -Referral to Dr. Shirlee More for consideration of consolidative treatment for peripheral T-cell lymphoma NOS CD30 neg -Please schedule cycle 6 of chemotherapy as per integrated scheduling  The total time spent in the appointment was 32 minutes* .  All of the patient's questions were answered with apparent satisfaction. The patient knows to call the clinic with any problems, questions or concerns.   Sullivan Lone MD MS AAHIVMS [facility] Hematology/Oncology Physician [facility]  .*Total Encounter Time as defined by the Centers for Medicare and Medicaid Services includes, in addition to the face-to-face time of a patient visit (documented in the note above) non-face-to-face time: obtaining and reviewing outside history, ordering and reviewing medications, tests or procedures, care coordination (communications with other health care professionals or caregivers) and documentation in the medical record.   I, Cleda Mccreedy, am acting as a Education administrator for Sullivan Lone, MD. .I have reviewed the above documentation for accuracy and completeness, and I agree with the above. Brunetta Genera MD

## 2022-10-02 ENCOUNTER — Inpatient Hospital Stay: Payer: PPO

## 2022-10-02 ENCOUNTER — Other Ambulatory Visit: Payer: Self-pay

## 2022-10-02 VITALS — BP 152/86 | HR 79 | Temp 98.1°F | Resp 18

## 2022-10-02 DIAGNOSIS — Z5111 Encounter for antineoplastic chemotherapy: Secondary | ICD-10-CM | POA: Diagnosis not present

## 2022-10-02 DIAGNOSIS — C8448 Peripheral T-cell lymphoma, not classified, lymph nodes of multiple sites: Secondary | ICD-10-CM

## 2022-10-02 MED ORDER — HEPARIN SOD (PORK) LOCK FLUSH 100 UNIT/ML IV SOLN
500.0000 [IU] | Freq: Once | INTRAVENOUS | Status: AC | PRN
  Administered 2022-10-02: 500 [IU]

## 2022-10-02 MED ORDER — SODIUM CHLORIDE 0.9 % IV SOLN: Freq: Once | INTRAVENOUS | Status: AC

## 2022-10-02 MED ORDER — SODIUM CHLORIDE 0.9 % IV SOLN
80.0000 mg/m2 | Freq: Once | INTRAVENOUS | Status: AC
  Administered 2022-10-02: 170 mg via INTRAVENOUS
  Filled 2022-10-02: qty 8.5

## 2022-10-02 MED ORDER — SODIUM CHLORIDE 0.9% FLUSH
10.0000 mL | INTRAVENOUS | Status: DC | PRN
  Administered 2022-10-02: 10 mL

## 2022-10-02 MED ORDER — SODIUM CHLORIDE 0.9 % IV SOLN
10.0000 mg | Freq: Once | INTRAVENOUS | Status: AC
  Administered 2022-10-02: 10 mg via INTRAVENOUS
  Filled 2022-10-02: qty 10

## 2022-10-02 NOTE — Progress Notes (Signed)
Initial neurology clinic note  SERVICE DATE: 10/11/22  Reason for Evaluation: Consultation requested by Brunetta Genera, MD for an opinion regarding numbness and tingling of hands. My final recommendations will be communicated back to the requesting physician by way of shared medical record or letter to requesting physician via [name] mail.  HPI: This is [name], a 72 y.o. right-handed female with a medical history of anal cancer, bilateral breast cancer, lymphoma, HTN, HLD, depression, anxiety, pre-diabetes, lumbar stenosis, migraines who presents to neurology clinic with the chief complaint of numbness and tingling of hands. The patient is alone today.  Patient first noticed numbness and tingling in the past 2 months in her feet and thumb and index fingers bilaterally. It now includes her middle fingers. She had difficulty using these fingers. She feels like there is something on her feet when there isn't. She feels some imbalance when walking. She has "gracefully went to the floor" (fell) due to feeling lightheaded and in a cold sweat (07/2022). She also feels off balance if she gets up too fast.  She was told she might have carpal tunnel syndrome and recommended to wear braces by an oncologist. She does wear them on a regular basis due to comfort. Oncology suggested the onset of neuropathy was following the first cycle of chemotherapy for lymphoma and worse with each infusion (likely 2/2 vincristine). Patient has continued with vincristine despite symptoms, but it was reduced. Patient did not want it stopped as she only has a month to go.  Patient mentions she has difficulty getting up from toilet and needs assistance with that.  Patient has not taking any or tried any neuropathic medications.  Patient is on a vitamin B complex, but only for a couple of months.  Oncology history per Dr. Grier Mitts clinic note from 09/10/22: Cynthia Velasquez has been referred to [name] by [name] for  evaluation and management of newly diagnosed T-cell non-Hodgkin's lymphoma.   Patient has a history of bilateral breast cancer status post mastectomies.  -2007: s/p right lumpectomy for ADH in 08/2006 showed DCIS ER/PR+, grade 1. 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 She was adopted, no known family history     She subsequently was diagnosed with anal squamous cell carcinoma cT2 N0 M0 diagnosed in September 2020.Workup showed a 3 cm mass at the anorectal junction, biopsy confirmed well to moderately differentiated squamous cell carcinoma. 06/21/19 PET scan showed no metastasis.  -S/p concurrent chemoRT with Mitomycin and 5FU on 08/09/19. Now on surveillance. -surveillance CT CAP 07/09/21 was NED. -surveillance colonoscopy done for rectal bleeding on 12/07/21 showed only radiation changes. Her rectal bleeding has resolved.   Recently patient presented to her primary care physician on 05/23/2022 with her new right neck lump which has been progressively enlarging.  She also subsequently developed a left neck swelling.  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   She has had a PET CT scan done on 06/19/2022 which showed hypermetabolic bilateral neck lymphadenopathy.  Also noted to have a new 1 cm focus of metabolic activity in segment 4 of the liver.  This is etiology indeterminate. Lumbar scoliosis and small supraumbilical hernia.  Patient has had Port-A-Cath placement done on 06/25/2022. Echocardiogram was done on 06/17/2022 and shows normal ejection fraction of 70 to 75% with grade 1 diastolic dysfunction.  Right  ventricular systolic function within normal limits.   INTERVAL HISTORY   Cynthia Velasquez is here today for evaluation and management of T-cell non-Hodgkin's lymphoma. She is here for follow-up prior to cycle 4 of CEOP. She has not had a PET CT scan yet but this is scheduled for 09/19/2021. She has seen Dr. Mickeal Skinner who feels that tingling and numbness in her fingers could be from chemotherapy or from carpal tunnel syndrome but likely from chemotherapy. Her vincristine was dose reduced for her last chemotherapy cycle and her neuropathy is stable.  She prefers to continue with the same dose of vincristine as opposed to holding this chemotherapy completely. No new infections or other new toxicities at this time No new lumps or bumps noted. No new abdominal pain or distention. Patient has received the influenza vaccine, COVID-19 Booster, and RSV vaccine.   Current chemotherapy: Cytoxan, Etoposide, vincristine, decadron  EtOH use: Rare  Restrictive diet? No Family history of neuropathy/myopathy/neurologic disease? Not aware, patient is adopted  Patient had an EMG in her 63s and told she had carpal tunnel back then (50 years ago).   MEDICATIONS:  Outpatient Encounter Medications as of 10/11/2022  Medication Sig   acyclovir (ZOVIRAX) 400 MG tablet Take 1 tablet (400 mg total) by mouth daily.   aspirin-acetaminophen-caffeine (EXCEDRIN MIGRAINE) 250-250-65 MG tablet Take 2 tablets by mouth every 6 (six) hours as needed for headache.   baclofen (LIORESAL) 10 MG tablet Take 1 tablet (10 mg total) by mouth 3 (three) times daily as needed for muscle spasms.   famotidine (PEPCID) 20 MG tablet Take 1 tablet (20 mg total) by mouth 2 (two) times daily.   HYDROcodone-acetaminophen (NORCO) 5-325 MG tablet Take 1 tablet by mouth every 6 (six) hours as needed for moderate pain.   lidocaine-prilocaine (EMLA) cream Apply to affected area once (Patient not taking: Reported on 08/20/2022)   loratadine (CLARITIN) 10 MG  tablet TAKE 1 TABLET(10 MG) BY MOUTH DAILY   losartan (COZAAR) 100 MG tablet Take 100 mg by mouth daily.   Menthol-Methyl Salicylate (SALONPAS PAIN RELIEF PATCH EX) Apply 1 patch topically daily as needed (pain).   nystatin (MYCOSTATIN/NYSTOP) powder Apply 1 Application topically 3 (three) times daily.   ondansetron (ZOFRAN) 8 MG tablet Take 1 tablet (8 mg total) by mouth every 8 (eight) hours as needed for nausea or vomiting. Start on the third day after cyclophosphamide chemotherapy.   predniSONE (DELTASONE) 20 MG tablet Take 3 tablets (60 mg total) by mouth daily. Take for 2 days starting the day after chemotherapy on day 4 (Patient not taking: Reported on 09/03/2022)   prochlorperazine (COMPAZINE) 10 MG tablet Take 1 tablet (10 mg total) by mouth every 6 (six) hours as needed for nausea or vomiting.   Saccharomyces boulardii 500 MG PACK Take 500 mg by mouth 2 (two) times daily with a meal.   senna-docusate (SENNA S) 8.6-50 MG tablet Take 2 tablets by mouth at bedtime.   No facility-administered encounter medications on file as of 10/11/2022.    PAST MEDICAL HISTORY: Past Medical History:  Diagnosis Date   Anal cancer ([facility]) dx'd 05/2019   Anxiety    Arthritis    Bilateral breast cancer ([facility]) 10/21/2013   Breast cancer ([facility])    Cancer ([facility])    Depression    Gallstones    GERD (  gastroesophageal reflux disease)    doesn't take any meds for this   Headache    h/o migraines       History of bladder infections    History of hiatal hernia    History of migraine    Hyperlipidemia    takes Simvastatin daily   Hypertension    takes Hyzaar   Joint pain    Joint swelling    Lumbar stenosis    Neuropathy 09/07/2013   Pneumonia    Pre-diabetes     PAST SURGICAL HISTORY: Past Surgical History:  Procedure Laterality Date   ABDOMINAL HYSTERECTOMY     bone spur removed from left foot     CHOLECYSTECTOMY     COLONOSCOPY     COLONOSCOPY WITH PROPOFOL Bilateral 12/07/2021   Procedure:  COLONOSCOPY WITH PROPOFOL;  Surgeon: Clarene Essex, MD;  Location: [facility];  Service: Endoscopy;  Laterality: Bilateral;   double mastectomy      ESOPHAGOGASTRODUODENOSCOPY N/A 12/07/2021   Procedure: ESOPHAGOGASTRODUODENOSCOPY (EGD);  Surgeon: Clarene Essex, MD;  Location: [facility];  Service: Endoscopy;  Laterality: N/A;   HERNIA REPAIR     umbilical   IR IMAGING GUIDED PORT INSERTION  06/25/2022   right knee arthroscopy     right shoulder arthroscopy     surgery for hiatal hernia     TONSILLECTOMY     TOTAL HIP ARTHROPLASTY Left 02/12/2013   TOTAL HIP ARTHROPLASTY Left 02/12/2013   Procedure: LEFT TOTAL HIP ARTHROPLASTY;  Surgeon: Kerin Salen, MD;  Location: [facility];  Service: Orthopedics;  Laterality: Left;   TOTAL HIP ARTHROPLASTY Right 05/03/2013   Procedure: TOTAL HIP ARTHROPLASTY;  Surgeon: Kerin Salen, MD;  Location: [facility];  Service: Orthopedics;  Laterality: Right;   TOTAL KNEE ARTHROPLASTY Right 02/27/2015   Procedure: TOTAL KNEE ARTHROPLASTY;  Surgeon: Frederik Pear, MD;  Location: [facility];  Service: Orthopedics;  Laterality: Right;   TOTAL SHOULDER ARTHROPLASTY Right 09/12/2016   Procedure: RIGHT TOTAL SHOULDER ARTHROPLASTY;  Surgeon: Tania Ade, MD;  Location: [facility];  Service: Orthopedics;  Laterality: Right;  RIGHT TOTAL SHOULDER ARTHROPLASTY    ALLERGIES: Allergies  Allergen Reactions   Tramadol Itching and Rash   Codeine Itching and Rash   Vicodin [Hydrocodone-Acetaminophen] Itching and Rash    FAMILY HISTORY: Family History  Adopted: Yes  Problem Relation Age of Onset   Allergic rhinitis Neg Hx    Angioedema Neg Hx    Atopy Neg Hx    Eczema Neg Hx    Immunodeficiency Neg Hx    Urticaria Neg Hx    Asthma Neg Hx     SOCIAL HISTORY: Social History   Tobacco Use   Smoking status: Never   Smokeless tobacco: Never  Vaping Use   Vaping Use: Never used  Substance Use Topics   Alcohol use: No    Comment: occ   Drug use: No   Social History   Social  History Narrative   Are you right handed or left handed? Right   Are you currently employed ? no   What is your current occupation?retired   Do you live at home alone? no   Who lives with you? Daughter and granddaughter   What type of home do you live in: 1 story or 2 story? one   Caffeine 1 cup a day     OBJECTIVE: PHYSICAL EXAM: BP 121/78   Pulse 96   Ht '5\' 5"'$  (1.651 m)   Wt 210 lb (95.3 kg)  SpO2 95%   BMI 34.95 kg/m   General: General appearance: Awake and alert. No distress. Cooperative with exam.  HEENT: Atraumatic. Anicteric. Lungs: Non-labored breathing on room air  Psych: Affect appropriate.  Neurological: Mental Status: Alert. Speech fluent. No pseudobulbar affect Cranial Nerves: CNII: No RAPD. Visual fields grossly intact. CNIII, IV, VI: PERRL. No nystagmus. EOMI. CN V: Facial sensation intact bilaterally to fine touch. CN VII: Facial muscles symmetric and strong. No ptosis at rest. CN VIII: Hearing grossly intact bilaterally. CN IX: No hypophonia. CN X: Palate elevates symmetrically. CN XI: Full strength shoulder shrug bilaterally. CN XII: Tongue protrusion full and midline. No atrophy or fasciculations. No significant dysarthria Motor: Tone is normal. Mild atrophy at bilateral thenar eminence (APB muscle).  Individual muscle group testing (MRC grade out of 5):  Movement     Neck flexion 5    Neck extension 5     Right Left   Shoulder abduction 5- 5- Limited by shoulder pain  Elbow flexion 5 5   Elbow extension 5 5   Wrist extension 5 5   Wrist flexion 5 5   Finger abduction - FDI 5 5   Finger abduction - ADM 5 5   Finger extension 5 5   Finger distal flexion - 2/'3 5 5   '$ Finger distal flexion - 4/'5 5 5   '$ Thumb flexion - FPL 5 5   Thumb abduction - APB 4 4    Hip flexion 4+ 4+   Hip extension 5 5   Hip adduction 5 5   Hip abduction 5 5   Knee extension 5 5   Knee flexion 5 5   Dorsiflexion 5- 5   Plantarflexion 5 5   Inversion 5 5    Eversion 5 5   Great toe extension 4+ 4+   Great toe flexion 5 5     Reflexes:  Right Left   Bicep 2+ 2+   Tricep 2+ 2+   BrRad 2+ 2+   Knee 0 0   Ankle 0 0    Pathological Reflexes: Babinski: flexor response bilaterally Hoffman: absent bilaterally Troemner: absent bilaterally Sensation: Pinprick: 50% over thenar eminence compared to medial aspect of hand; diminished in bilateral lower extremities into thighs Vibration: Absent in bilateral great toes, diminished in bilateral ankles, and fully intact at bilateral patella Proprioception: Intact in bilateral great toes Coordination: Intact finger-to- nose-finger bilaterally. Romberg with moderate sway. Gait: Unable to rise from chair with arms crossed unassisted. Narrow-based, mildly unsteady gait.  Lab and Test Review: Internal labs: 10/01/22: Normal or unremarkable: LDH, CMP CBC significant for anemia (Hb 10.6)  TSH (12/23/19): 1.578  Imaging: MRI lumbar spine (04/18/22): FINDINGS: Segmentation:  5 lumbar vertebrae.  Rudimentary S1-2 disc.   Alignment: Moderate upper lumbar dextroscoliosis. Unchanged grade 1 anterolisthesis of L3 on L4 and L4 on L5.   Vertebrae: No fracture or suspicious marrow lesion. Persistent mild right facet edema at L4-5.   Conus medullaris and cauda equina: Conus extends to the L1 level. Conus and cauda equina appear normal.   Paraspinal and other soft tissues: Small bilateral renal cysts.   Disc levels:   Disc desiccation and mild-to-moderate disc space narrowing throughout the lumbar and included lower thoracic spine.   T10-11: Only imaged sagittally. Mild disc bulging and moderate to severe right facet arthrosis result in mild-to-moderate right neural foraminal stenosis without spinal stenosis, unchanged.   T11-12: Only imaged sagittally. Disc bulging and moderate to severe right and moderate left  facet arthrosis result in moderate to severe right neural foraminal stenosis without  spinal stenosis, unchanged.   T12-L1: Disc bulging, a left paracentral to left subarticular disc protrusion, and moderate left greater than right facet hypertrophy result in mild left lateral recess stenosis and mild left neural foraminal stenosis without spinal stenosis, unchanged.   L1-2: Left eccentric disc bulging and moderate left greater than right facet and ligamentum flavum hypertrophy result in mild left lateral recess stenosis without spinal or neural foraminal stenosis, unchanged.   L2-3: Disc bulging, a left foraminal disc protrusion, and moderate right and severe left facet and ligamentum flavum hypertrophy result in borderline spinal stenosis, mild left lateral recess stenosis, and moderate left neural foraminal stenosis, unchanged.   L3-4: Anterolisthesis with left eccentric bulging of uncovered disc, a left foraminal disc protrusion, and severe facet and ligamentum flavum hypertrophy result in mild-to-moderate spinal stenosis, mild-to-moderate left greater than right lateral recess stenosis, and mild right and severe left neural foraminal stenosis, stable to minimally progressed. Potential left L3 nerve root impingement.   L4-5: Anterolisthesis with bulging uncovered disc and severe right greater than left facet and ligamentum flavum hypertrophy result in mild-to-moderate spinal stenosis, mild-to-moderate bilateral lateral recess stenosis, and the moderate bilateral neural foraminal stenosis, unchanged.   L5-S1: Bilateral facet ankylosis.  Mild disc bulging.  No stenosis.   IMPRESSION: Diffuse lumbar disc and facet degeneration without significant interval change resulting in mild-to-moderate spinal stenosis and up to severe neural foraminal stenosis as above.  ASSESSMENT: Cynthia Velasquez is a 72 y.o. female who presents for evaluation of numbness and tingling of feet and hands. She has a relevant medical history of anal cancer, bilateral breast cancer, lymphoma,  HTN, HLD, depression, anxiety, pre-diabetes, lumbar stenosis, migraines. Her neurological examination is pertinent for length dependent sensory loss, weakness of thumb abduction, and weakness of hip flexors. Available diagnostic data is significant for MRI lumbar spine from 04/2022 with potential left L3 impingement. Patient's symptoms are likely multifactorial. She has a known history of bilateral carpal tunnel syndrome for about 50 years. This is likely contributing to her numbness/tingling in hands and weakness of hands. She also likely has a peripheral neuropathy, likely secondary to chemotherapy (vincristine) as her symptoms started soon after this. In terms of her hip flexor weakness, this could be due to deconditioning and/or lumbar stenosis. I will get lab work to look for other treatable causes of neuropathy and an EMG to further characterize symptoms and their severity.  PLAN: -Blood work: B1, B12, HbA1c, IFE, TSH -EMG: PN protcol (R > L)  -Physical therapy for proximal leg weakness (likely deconditioning) and imbalance -For neuropathic pain, start Cymbalta 30 mg for 1 week, then increase to 60 mg thereafter if no side effects  -Return to clinic in 3 months  The impression above as well as the plan as outlined below were extensively discussed with the patient who voiced understanding. All questions were answered to their satisfaction.  The patient was counseled on pertinent fall precautions per the printed material provided today, and as noted under the "Patient Instructions" section below.  When available, results of the above investigations and possible further recommendations will be communicated to the patient via telephone/MyChart. Patient to call office if not contacted after expected testing turnaround time.   Total time spent reviewing records, interview, history/exam, documentation, and coordination of care on day of encounter:  65 min   Thank you for allowing me to participate  in patient's care.  If I can answer  any additional questions, I would be pleased to do so.  Kai Levins, MD   CC: Kathyrn Lass, MD [place] [id number]  CC: Referring provider: Brunetta Genera, [place],  [place] [id number]

## 2022-10-02 NOTE — Patient Instructions (Signed)
[facility] ONCOL[facility]  Discharge Instructions: Thank you for choosing [facility] to provide your oncology and hematology care.   If you have a lab appointment with the [facility], please go directly to the [facility] and check in at the registration area.   Wear comfortable clothing and clothing appropriate for easy access to any Portacath or PICC line.   We strive to give you quality time with your provider. You may need to reschedule your appointment if you arrive late (15 or more minutes).  Arriving late affects you and other patients whose appointments are after yours.  Also, if you miss three or more appointments without notifying the office, you may be dismissed from the clinic at the provider's discretion.      For prescription refill requests, have your pharmacy contact our office and allow 72 hours for refills to be completed.    Today you received the following chemotherapy and/or immunotherapy agents: etoposide      To help prevent nausea and vomiting after your treatment, we encourage you to take your nausea medication as directed.  BELOW ARE SYMPTOMS THAT SHOULD BE REPORTED IMMEDIATELY: *FEVER GREATER THAN 100.4 F (38 C) OR HIGHER *CHILLS OR SWEATING *NAUSEA AND VOMITING THAT IS NOT CONTROLLED WITH YOUR NAUSEA MEDICATION *UNUSUAL SHORTNESS OF BREATH *UNUSUAL BRUISING OR BLEEDING *URINARY PROBLEMS (pain or burning when urinating, or frequent urination) *BOWEL PROBLEMS (unusual diarrhea, constipation, pain near the anus) TENDERNESS IN MOUTH AND THROAT WITH OR WITHOUT PRESENCE OF ULCERS (sore throat, sores in mouth, or a toothache) UNUSUAL RASH, SWELLING OR PAIN  UNUSUAL VAGINAL DISCHARGE OR ITCHING   Items with * indicate a potential emergency and should be followed up as soon as possible or go to the Emergency Department if any problems should occur.  Please show the CHEMOTHERAPY ALERT CARD or IMMUNOTHERAPY ALERT CARD at check-in to  the Emergency Department and triage nurse.  Should you have questions after your visit or need to cancel or reschedule your appointment, please contact [facility]  Dept: [phone]  and follow the prompts.  Office hours are 8:00 a.m. to 4:30 p.m. Monday - Friday. Please note that voicemails left after 4:00 p.m. may not be returned until the following business day.  We are closed weekends and major holidays. You have access to a nurse at all times for urgent questions. Please call the main number to the clinic Dept: [phone] and follow the prompts.   For any non-urgent questions, you may also contact your provider using MyChart. We now offer e-Visits for anyone 42 and older to request care online for non-urgent symptoms. For details visit [email or web address].   Also download the MyChart app! Go to the app store, search "MyChart", open the app, select [facility], and log in with your MyChart username and password.

## 2022-10-03 ENCOUNTER — Inpatient Hospital Stay: Payer: PPO

## 2022-10-03 VITALS — BP 152/75 | HR 70 | Temp 98.4°F | Resp 18

## 2022-10-03 DIAGNOSIS — C8448 Peripheral T-cell lymphoma, not classified, lymph nodes of multiple sites: Secondary | ICD-10-CM

## 2022-10-03 DIAGNOSIS — Z5111 Encounter for antineoplastic chemotherapy: Secondary | ICD-10-CM | POA: Diagnosis not present

## 2022-10-03 MED ORDER — SODIUM CHLORIDE 0.9 % IV SOLN: Freq: Once | INTRAVENOUS | Status: AC

## 2022-10-03 MED ORDER — SODIUM CHLORIDE 0.9 % IV SOLN
80.0000 mg/m2 | Freq: Once | INTRAVENOUS | Status: AC
  Administered 2022-10-03: 170 mg via INTRAVENOUS
  Filled 2022-10-03: qty 8.5

## 2022-10-03 MED ORDER — SODIUM CHLORIDE 0.9% FLUSH
10.0000 mL | INTRAVENOUS | Status: DC | PRN
  Administered 2022-10-03: 10 mL

## 2022-10-03 MED ORDER — SODIUM CHLORIDE 0.9 % IV SOLN
10.0000 mg | Freq: Once | INTRAVENOUS | Status: AC
  Administered 2022-10-03: 10 mg via INTRAVENOUS
  Filled 2022-10-03: qty 10

## 2022-10-03 MED ORDER — HEPARIN SOD (PORK) LOCK FLUSH 100 UNIT/ML IV SOLN
500.0000 [IU] | Freq: Once | INTRAVENOUS | Status: AC | PRN
  Administered 2022-10-03: 500 [IU]

## 2022-10-03 NOTE — Patient Instructions (Signed)
[facility]  Discharge Instructions: Thank you for choosing [facility] to provide your oncology and hematology care.   If you have a lab appointment with the Cancer Center, please go directly to the Cancer Center and check in at the registration area.   Wear comfortable clothing and clothing appropriate for easy access to any Portacath or PICC line.   We strive to give you quality time with your provider. You may need to reschedule your appointment if you arrive late (15 or more minutes).  Arriving late affects you and other patients whose appointments are after yours.  Also, if you miss three or more appointments without notifying the office, you may be dismissed from the clinic at the provider's discretion.      For prescription refill requests, have your pharmacy contact our office and allow 72 hours for refills to be completed.    Today you received the following chemotherapy and/or immunotherapy agents: Etoposide.   To help prevent nausea and vomiting after your treatment, we encourage you to take your nausea medication as directed.  BELOW ARE SYMPTOMS THAT SHOULD BE REPORTED IMMEDIATELY: *FEVER GREATER THAN 100.4 F (38 C) OR HIGHER *CHILLS OR SWEATING *NAUSEA AND VOMITING THAT IS NOT CONTROLLED WITH YOUR NAUSEA MEDICATION *UNUSUAL SHORTNESS OF BREATH *UNUSUAL BRUISING OR BLEEDING *URINARY PROBLEMS (pain or burning when urinating, or frequent urination) *BOWEL PROBLEMS (unusual diarrhea, constipation, pain near the anus) TENDERNESS IN MOUTH AND THROAT WITH OR WITHOUT PRESENCE OF ULCERS (sore throat, sores in mouth, or a toothache) UNUSUAL RASH, SWELLING OR PAIN  UNUSUAL VAGINAL DISCHARGE OR ITCHING   Items with * indicate a potential emergency and should be followed up as soon as possible or go to the Emergency Department if any problems should occur.  Please show the CHEMOTHERAPY ALERT CARD or IMMUNOTHERAPY ALERT CARD at check-in to  the Emergency Department and triage nurse.  Should you have questions after your visit or need to cancel or reschedule your appointment, please contact [facility]  Dept: [phone]  and follow the prompts.  Office hours are 8:00 a.m. to 4:30 p.m. Monday - Friday. Please note that voicemails left after 4:00 p.m. may not be returned until the following business day.  We are closed weekends and major holidays. You have access to a nurse at all times for urgent questions. Please call the main number to the clinic Dept: [phone] and follow the prompts.   For any non-urgent questions, you may also contact your provider using MyChart. We now offer e-Visits for anyone 18 and older to request care online for non-urgent symptoms. For details visit [email or web address].   Also download the MyChart app! Go to the app store, search "MyChart", open the app, select [facility], and log in with your MyChart username and password.   

## 2022-10-05 ENCOUNTER — Inpatient Hospital Stay: Payer: PPO

## 2022-10-05 VITALS — BP 173/86 | HR 69 | Temp 97.7°F | Resp 18

## 2022-10-05 DIAGNOSIS — C8448 Peripheral T-cell lymphoma, not classified, lymph nodes of multiple sites: Secondary | ICD-10-CM

## 2022-10-05 DIAGNOSIS — Z5111 Encounter for antineoplastic chemotherapy: Secondary | ICD-10-CM | POA: Diagnosis not present

## 2022-10-05 MED ORDER — PEGFILGRASTIM-CBQV 6 MG/0.6ML ~~LOC~~ SOSY
6.0000 mg | PREFILLED_SYRINGE | Freq: Once | SUBCUTANEOUS | Status: AC
  Administered 2022-10-05: 6 mg via SUBCUTANEOUS
  Filled 2022-10-05: qty 0.6

## 2022-10-07 ENCOUNTER — Encounter: Payer: Self-pay | Admitting: Hematology

## 2022-10-08 ENCOUNTER — Telehealth: Payer: Self-pay

## 2022-10-08 ENCOUNTER — Encounter: Payer: Self-pay | Admitting: Hematology

## 2022-10-08 NOTE — Telephone Encounter (Signed)
Contacted pt regarding MyChart message. Discussed pt timing of steroid post treatment to verify her understanding. Pt verbalized that she was taking it correctly. Discussed bowel issues with pt and educated pt regarding taking Mag Citrate for her constipation. Pt agreed and will call back if further issues. Discussed pt's c/o rectal bleeding and pt currently not passing any blood and will continue to monitor. Pt was having some bleeding when attempting to have a BM. Pt to seek higher level of care right away if she has any more substantial rectal bleeding.  Pt to call back if she continues to have constipation issues after the mag citrate. Pt will also make an eye MD appointment for the near future to evaluate her vision.

## 2022-10-11 ENCOUNTER — Ambulatory Visit (INDEPENDENT_AMBULATORY_CARE_PROVIDER_SITE_OTHER): Payer: PPO | Admitting: Neurology

## 2022-10-11 ENCOUNTER — Other Ambulatory Visit (INDEPENDENT_AMBULATORY_CARE_PROVIDER_SITE_OTHER): Payer: PPO

## 2022-10-11 ENCOUNTER — Encounter: Payer: Self-pay | Admitting: Neurology

## 2022-10-11 VITALS — BP 121/78 | HR 96 | Ht 65.0 in | Wt 210.0 lb

## 2022-10-11 DIAGNOSIS — T451X5A Adverse effect of antineoplastic and immunosuppressive drugs, initial encounter: Secondary | ICD-10-CM

## 2022-10-11 DIAGNOSIS — G5603 Carpal tunnel syndrome, bilateral upper limbs: Secondary | ICD-10-CM | POA: Diagnosis not present

## 2022-10-11 DIAGNOSIS — Z131 Encounter for screening for diabetes mellitus: Secondary | ICD-10-CM | POA: Diagnosis not present

## 2022-10-11 DIAGNOSIS — G620 Drug-induced polyneuropathy: Secondary | ICD-10-CM | POA: Diagnosis not present

## 2022-10-11 DIAGNOSIS — R209 Unspecified disturbances of skin sensation: Secondary | ICD-10-CM

## 2022-10-11 DIAGNOSIS — R29898 Other symptoms and signs involving the musculoskeletal system: Secondary | ICD-10-CM | POA: Diagnosis not present

## 2022-10-11 DIAGNOSIS — R2689 Other abnormalities of gait and mobility: Secondary | ICD-10-CM

## 2022-10-11 LAB — TSH: TSH: 1.02 u[IU]/mL (ref 0.35–5.50)

## 2022-10-11 LAB — VITAMIN B12: Vitamin B-12: 963 pg/mL — ABNORMAL HIGH (ref 211–911)

## 2022-10-11 LAB — HEMOGLOBIN A1C: Hgb A1c MFr Bld: 6.3 % (ref 4.6–6.5)

## 2022-10-11 MED ORDER — DULOXETINE HCL 30 MG PO CPEP: 60.0000 mg | ORAL_CAPSULE | Freq: Every day | ORAL | 5 refills | Status: DC

## 2022-10-11 NOTE — Patient Instructions (Addendum)
Your symptoms sound like a combination of neuropathy and carpal tunnel syndrome. The neuropathy is likely because of your chemotherapy.  I would like to do some blood work today to make sure there is no other causes contributing.  We will also get a muscle and nerve test called an EMG (see more information below) to make sure this is not related to your back as well.  I will start a medication to help with neuropathy called Cymbalta. The major side effects are sleepiness, nausea, and dry mouth. You will start low at 30 mg at bedtime for 1 week. If no major side effects, you can increase to 60 mg at bedtime. I have sent this prescription to your pharmacy.  Cock up wrist splint for carpal tunnel symptoms can be bought in local drug stores or online. This should especially be worn at night while sleeping.     - Recommend the following measures that may provide some symptomatic benefit for muscle twitching and/or cramps: - Adequate oral clear fluid intake to maintain optimal hydration (about 2.5 liters, or around 8-10 glasses per day) Avoidance of caffeine Trial of DIET tonic water: About 1 glass, up to 6 times daily Magnesium oxide up to 400 mg by mouth twice daily, as needed (over the counter) Gentle muscle stretching routine, especially before bedtime  I recommend physical therapy for your leg weakness and imbalance.  I will be in touch when I have your results. Please let me know if you have any questions or concerns in the meantime.  I want to see you back in clinic in 3 months.  The physicians and staff at [facility] are committed to providing excellent care. You may receive a survey requesting feedback about your experience at our office. We strive to receive "very good" responses to the survey questions. If you feel that your experience would prevent you from giving the office a "very good " response, please contact our office to try to remedy the situation. We may be reached at  [phone]. Thank you for taking the time out of your busy day to complete the survey.  Kai Levins, MD [facility]  ELECTROMYOGRAM AND NERVE CONDUCTION STUDIES (EMG/NCS) INSTRUCTIONS  How to Prepare The neurologist conducting the EMG will need to know if you have certain medical conditions. Tell the neurologist and other EMG lab personnel if you: Have a pacemaker or any other electrical medical device Take blood-thinning medications Have hemophilia, a blood-clotting disorder that causes prolonged bleeding Bathing Take a shower or bath shortly before your exam in order to remove oils from your skin. Don't apply lotions or creams before the exam.  What to Expect You'll likely be asked to change into a hospital gown for the procedure and lie down on an examination table. The following explanations can help you understand what will happen during the exam.  Electrodes. The neurologist or a technician places surface electrodes at various locations on your skin depending on where you're experiencing symptoms. Or the neurologist may insert needle electrodes at different sites depending on your symptoms.  Sensations. The electrodes will at times transmit a tiny electrical current that you may feel as a twinge or spasm. The needle electrode may cause discomfort or pain that usually ends shortly after the needle is removed. If you are concerned about discomfort or pain, you may want to talk to the neurologist about taking a short break during the exam.  Instructions. During the needle EMG, the neurologist will assess whether there  is any spontaneous electrical activity when the muscle is at rest - activity that isn't present in healthy muscle tissue - and the degree of activity when you slightly contract the muscle.  He or she will give you instructions on resting and contracting a muscle at appropriate times. Depending on what muscles and nerves the neurologist is examining, he or she may ask you  to change positions during the exam.  After your EMG You may experience some temporary, minor bruising where the needle electrode was inserted into your muscle. This bruising should fade within several days. If it persists, contact your primary care doctor.   Preventing Falls at [facility] are common, often dreaded events in the lives of older people. Aside from the obvious injuries and even death that may result, fall can cause wide-ranging consequences including loss of independence, mental decline, decreased activity and mobility. Younger people are also at risk of falling, especially those with chronic illnesses and fatigue.  Ways to reduce risk for falling Examine diet and medications. Warm foods and alcohol dilate blood vessels, which can lead to dizziness when standing. Sleep aids, antidepressants and pain medications can also increase the likelihood of a fall.  Get a vision exam. Poor vision, cataracts and glaucoma increase the chances of falling.  Check foot gear. Shoes should fit snugly and have a sturdy, nonskid sole and a broad, low heel  Participate in a physician-approved exercise program to build and maintain muscle strength and improve balance and coordination. Programs that use ankle weights or stretch bands are excellent for muscle-strengthening. Water aerobics programs and low-impact Tai Chi programs have also been shown to improve balance and coordination.  Increase vitamin D intake. Vitamin D improves muscle strength and increases the amount of calcium the body is able to absorb and deposit in bones.  How to prevent falls from common hazards Floors - Remove all loose wires, cords, and throw rugs. Minimize clutter. Make sure rugs are anchored and smooth. Keep furniture in its usual place.  Chairs -- Use chairs with straight backs, armrests and firm seats. Add firm cushions to existing pieces to add height.  Bathroom - Install grab bars and non-skid tape in the tub or  shower. Use a bathtub transfer bench or a shower chair with a back support Use an elevated toilet seat and/or safety rails to assist standing from a low surface. Do not use towel racks or bathroom tissue holders to help you stand.  Lighting - Make sure halls, stairways, and entrances are well-lit. Install a night light in your bathroom or hallway. Make sure there is a light switch at the top and bottom of the staircase. Turn lights on if you get up in the middle of the night. Make sure lamps or light switches are within reach of the bed if you have to get up during the night.  Kitchen - Install non-skid rubber mats near the sink and stove. Clean spills immediately. Store frequently used utensils, pots, pans between waist and eye level. This helps prevent reaching and bending. Sit when getting things out of lower cupboards.  Living room/ Bedrooms - Place furniture with wide spaces in between, giving enough room to move around. Establish a route through the living room that gives you something to hold onto as you walk.  Stairs - Make sure treads, rails, and rugs are secure. Install a rail on both sides of the stairs. If stairs are a threat, it might be helpful to arrange most of  your activities on the lower level to reduce the number of times you must climb the stairs.  Entrances and doorways - Install metal handles on the walls adjacent to the doorknobs of all doors to make it more secure as you travel through the doorway.  Tips for maintaining balance Keep at least one hand free at all times. Try using a backpack or fanny pack to hold things rather than carrying them in your hands. Never carry objects in both hands when walking as this interferes with keeping your balance.  Attempt to swing both arms from front to back while walking. This might require a conscious effort if Parkinson's disease has diminished your movement. It will, however, help you to maintain balance and posture, and reduce  fatigue.  Consciously lift your feet off of the ground when walking. Shuffling and dragging of the feet is a common culprit in losing your balance.  When trying to navigate turns, use a "U" technique of facing forward and making a wide turn, rather than pivoting sharply.  Try to stand with your feet shoulder-length apart. When your feet are close together for any length of time, you increase your risk of losing your balance and falling.  Do one thing at a time. Don't try to walk and accomplish another task, such as reading or looking around. The decrease in your automatic reflexes complicates motor function, so the less distraction, the better.  Do not wear rubber or gripping soled shoes, they might "catch" on the floor and cause tripping.  Move slowly when changing positions. Use deliberate, concentrated movements and, if needed, use a grab bar or walking aid. Count 15 seconds between each movement. For example, when rising from a seated position, wait 15 seconds after standing to begin walking.  If balance is a continuous problem, you might want to consider a walking aid such as a cane, walking stick, or walker. Once you've mastered walking with help, you might be ready to try it on your own again.

## 2022-10-13 ENCOUNTER — Other Ambulatory Visit: Payer: Self-pay

## 2022-10-14 ENCOUNTER — Other Ambulatory Visit: Payer: Self-pay

## 2022-10-14 DIAGNOSIS — C8448 Peripheral T-cell lymphoma, not classified, lymph nodes of multiple sites: Secondary | ICD-10-CM

## 2022-10-16 LAB — IMMUNOFIXATION ELECTROPHORESIS
IgG (Immunoglobin G), Serum: 1070 mg/dL (ref 600–1540)
IgM, Serum: 47 mg/dL — ABNORMAL LOW (ref 50–300)
Immunoglobulin A: 239 mg/dL (ref 70–320)

## 2022-10-16 LAB — VITAMIN B1: Vitamin B1 (Thiamine): 9 nmol/L (ref 8–30)

## 2022-10-17 ENCOUNTER — Encounter: Payer: Self-pay | Admitting: Neurology

## 2022-10-17 DIAGNOSIS — J0140 Acute pansinusitis, unspecified: Secondary | ICD-10-CM | POA: Diagnosis not present

## 2022-10-18 ENCOUNTER — Other Ambulatory Visit: Payer: Self-pay

## 2022-10-18 ENCOUNTER — Telehealth: Payer: Self-pay

## 2022-10-18 NOTE — Telephone Encounter (Signed)
Called and left message for pt to call us.

## 2022-10-21 MED FILL — Dexamethasone Sodium Phosphate Inj 100 MG/10ML: INTRAMUSCULAR | Qty: 1 | Status: AC

## 2022-10-21 MED FILL — Fosaprepitant Dimeglumine For IV Infusion 150 MG (Base Eq): INTRAVENOUS | Qty: 5 | Status: AC

## 2022-10-22 ENCOUNTER — Inpatient Hospital Stay (HOSPITAL_BASED_OUTPATIENT_CLINIC_OR_DEPARTMENT_OTHER): Payer: PPO | Admitting: Hematology

## 2022-10-22 ENCOUNTER — Inpatient Hospital Stay: Payer: PPO | Attending: Hematology

## 2022-10-22 ENCOUNTER — Inpatient Hospital Stay: Payer: PPO

## 2022-10-22 ENCOUNTER — Other Ambulatory Visit: Payer: Self-pay

## 2022-10-22 VITALS — BP 141/85 | HR 87 | Resp 18

## 2022-10-22 DIAGNOSIS — C8448 Peripheral T-cell lymphoma, not classified, lymph nodes of multiple sites: Secondary | ICD-10-CM | POA: Diagnosis not present

## 2022-10-22 DIAGNOSIS — Z79899 Other long term (current) drug therapy: Secondary | ICD-10-CM | POA: Insufficient documentation

## 2022-10-22 DIAGNOSIS — R112 Nausea with vomiting, unspecified: Secondary | ICD-10-CM | POA: Diagnosis not present

## 2022-10-22 DIAGNOSIS — Z95828 Presence of other vascular implants and grafts: Secondary | ICD-10-CM

## 2022-10-22 DIAGNOSIS — Z5111 Encounter for antineoplastic chemotherapy: Secondary | ICD-10-CM | POA: Diagnosis not present

## 2022-10-22 DIAGNOSIS — Z5189 Encounter for other specified aftercare: Secondary | ICD-10-CM | POA: Insufficient documentation

## 2022-10-22 DIAGNOSIS — C50411 Malignant neoplasm of upper-outer quadrant of right female breast: Secondary | ICD-10-CM

## 2022-10-22 DIAGNOSIS — N390 Urinary tract infection, site not specified: Secondary | ICD-10-CM | POA: Diagnosis not present

## 2022-10-22 DIAGNOSIS — R197 Diarrhea, unspecified: Secondary | ICD-10-CM | POA: Insufficient documentation

## 2022-10-22 DIAGNOSIS — E876 Hypokalemia: Secondary | ICD-10-CM | POA: Insufficient documentation

## 2022-10-22 DIAGNOSIS — Z170 Estrogen receptor positive status [ER+]: Secondary | ICD-10-CM

## 2022-10-22 LAB — CBC WITH DIFFERENTIAL (CANCER CENTER ONLY)
Abs Immature Granulocytes: 0.01 10*3/uL (ref 0.00–0.07)
Basophils Absolute: 0 10*3/uL (ref 0.0–0.1)
Basophils Relative: 0 %
Eosinophils Absolute: 0 10*3/uL (ref 0.0–0.5)
Eosinophils Relative: 0 %
HCT: 33.1 % — ABNORMAL LOW (ref 36.0–46.0)
Hemoglobin: 10.8 g/dL — ABNORMAL LOW (ref 12.0–15.0)
Immature Granulocytes: 0 %
Lymphocytes Relative: 15 %
Lymphs Abs: 0.8 10*3/uL (ref 0.7–4.0)
MCH: 28.9 pg (ref 26.0–34.0)
MCHC: 32.6 g/dL (ref 30.0–36.0)
MCV: 88.5 fL (ref 80.0–100.0)
Monocytes Absolute: 0.4 10*3/uL (ref 0.1–1.0)
Monocytes Relative: 8 %
Neutro Abs: 3.8 10*3/uL (ref 1.7–7.7)
Neutrophils Relative %: 77 %
Platelet Count: 281 10*3/uL (ref 150–400)
RBC: 3.74 MIL/uL — ABNORMAL LOW (ref 3.87–5.11)
RDW: 16.2 % — ABNORMAL HIGH (ref 11.5–15.5)
WBC Count: 5 10*3/uL (ref 4.0–10.5)
nRBC: 0 % (ref 0.0–0.2)

## 2022-10-22 LAB — CMP (CANCER CENTER ONLY)
ALT: 11 U/L (ref 0–44)
AST: 17 U/L (ref 15–41)
Albumin: 3.7 g/dL (ref 3.5–5.0)
Alkaline Phosphatase: 84 U/L (ref 38–126)
Anion gap: 8 (ref 5–15)
BUN: 18 mg/dL (ref 8–23)
CO2: 24 mmol/L (ref 22–32)
Calcium: 9.5 mg/dL (ref 8.9–10.3)
Chloride: 109 mmol/L (ref 98–111)
Creatinine: 0.89 mg/dL (ref 0.44–1.00)
GFR, Estimated: >60 mL/min (ref >60)
Glucose, Bld: 99 mg/dL (ref 70–99)
Potassium: 3.4 mmol/L — ABNORMAL LOW (ref 3.5–5.1)
Sodium: 141 mmol/L (ref 135–145)
Total Bilirubin: 0.3 mg/dL (ref 0.3–1.2)
Total Protein: 7.2 g/dL (ref 6.5–8.1)

## 2022-10-22 LAB — LACTATE DEHYDROGENASE: LDH: 202 U/L — ABNORMAL HIGH (ref 98–192)

## 2022-10-22 MED ORDER — FAMOTIDINE IN NACL 20-0.9 MG/50ML-% IV SOLN
20.0000 mg | Freq: Once | INTRAVENOUS | Status: AC
  Administered 2022-10-22: 20 mg via INTRAVENOUS
  Filled 2022-10-22: qty 50

## 2022-10-22 MED ORDER — SODIUM CHLORIDE 0.9 % IV SOLN
600.0000 mg/m2 | Freq: Once | INTRAVENOUS | Status: AC
  Administered 2022-10-22: 1280 mg via INTRAVENOUS
  Filled 2022-10-22: qty 64

## 2022-10-22 MED ORDER — SODIUM CHLORIDE 0.9% FLUSH
10.0000 mL | Freq: Once | INTRAVENOUS | Status: AC
  Administered 2022-10-22: 10 mL

## 2022-10-22 MED ORDER — ACETAMINOPHEN 325 MG PO TABS
650.0000 mg | ORAL_TABLET | Freq: Once | ORAL | Status: AC
  Administered 2022-10-22: 650 mg via ORAL
  Filled 2022-10-22: qty 2

## 2022-10-22 MED ORDER — SODIUM CHLORIDE 0.9 % IV SOLN: INTRAVENOUS | Status: DC

## 2022-10-22 MED ORDER — VINCRISTINE SULFATE CHEMO INJECTION 1 MG/ML
1.0000 mg | Freq: Once | INTRAVENOUS | Status: AC
  Administered 2022-10-22: 1 mg via INTRAVENOUS
  Filled 2022-10-22: qty 1

## 2022-10-22 MED ORDER — SODIUM CHLORIDE 0.9 % IV SOLN
150.0000 mg | Freq: Once | INTRAVENOUS | Status: AC
  Administered 2022-10-22: 150 mg via INTRAVENOUS
  Filled 2022-10-22: qty 150

## 2022-10-22 MED ORDER — SODIUM CHLORIDE 0.9 % IV SOLN
80.0000 mg/m2 | Freq: Once | INTRAVENOUS | Status: AC
  Administered 2022-10-22: 170 mg via INTRAVENOUS
  Filled 2022-10-22: qty 8.5

## 2022-10-22 MED ORDER — HEPARIN SOD (PORK) LOCK FLUSH 100 UNIT/ML IV SOLN
500.0000 [IU] | Freq: Once | INTRAVENOUS | Status: AC | PRN
  Administered 2022-10-22: 500 [IU]

## 2022-10-22 MED ORDER — SODIUM CHLORIDE 0.9% FLUSH
10.0000 mL | INTRAVENOUS | Status: DC | PRN
  Administered 2022-10-22: 10 mL

## 2022-10-22 MED ORDER — SODIUM CHLORIDE 0.9 % IV SOLN
10.0000 mg | Freq: Once | INTRAVENOUS | Status: AC
  Administered 2022-10-22: 10 mg via INTRAVENOUS
  Filled 2022-10-22: qty 10

## 2022-10-22 MED ORDER — SODIUM CHLORIDE 0.9 % IV SOLN: Freq: Once | INTRAVENOUS | Status: AC

## 2022-10-22 MED ORDER — SACCHAROMYCES BOULARDII 500 MG PO PACK: 500.0000 mg | PACK | Freq: Two times a day (BID) | ORAL | 0 refills | Status: DC

## 2022-10-22 MED ORDER — PALONOSETRON HCL INJECTION 0.25 MG/5ML
0.2500 mg | Freq: Once | INTRAVENOUS | Status: AC
  Administered 2022-10-22: 0.25 mg via INTRAVENOUS
  Filled 2022-10-22: qty 5

## 2022-10-22 MED FILL — Dexamethasone Sodium Phosphate Inj 100 MG/10ML: INTRAMUSCULAR | Qty: 1 | Status: AC

## 2022-10-22 NOTE — Progress Notes (Signed)
[name]   [phone]) [phone] Fax:([phone]) [place] VISIT   Patient Care Team: Kathyrn Lass, MD as PCP - General (Family Medicine) Brunetta Genera, MD as PCP - Hematology/Oncology (Hematology) Wonda Horner, MD as Consulting Physician (Gastroenterology) Truitt Merle, MD as Consulting Physician (Hematology) Alla Feeling, NP as Nurse Practitioner (Nurse Practitioner) Kyung Rudd, MD as Consulting Physician (Radiation Oncology) Michael Boston, MD as Consulting Physician (General Surgery) Izora Gala, MD as Attending Physician (Otolaryngology)  Date of Service: 10/22/22    CHIEF COMPLAINT: f/u of recently diagnosed T cell lymphoma and prior to cycle 6 of chemotherapy  SUMMARY OF ONCOLOGIC HISTORY: Oncology History Overview Note  Cancer Staging Anal cancer [facility]) Staging form: Anus, AJCC 8th Edition - Clinical stage from 06/28/2019: Stage IIA (cT2, cN0, cM0) - Signed by Alla Feeling, NP on 06/28/2019  Breast cancer of upper-outer quadrant of left female breast [facility]) Staging form: Breast, AJCC 7th Edition - Clinical stage from 09/07/2013: Stage IIA (T2, N0, cM0) - Signed by [name], MD on 09/07/2013 - Pathologic: Stage IIA (T2, N0, cM0) - Signed by [name], MD on 09/07/2013  Breast cancer of upper-outer quadrant of right female breast [facility]) Staging form: Breast, AJCC 7th Edition - Clinical stage from 09/07/2013: Stage IA (T1a, N0, cM0) - Signed by [name], MD on 09/07/2013 - Pathologic: Stage IA (T1a, N0, cM0) - Signed by [name], MD on 09/07/2013    Breast cancer of upper-outer quadrant of left female breast ([place])  07/10/2007 Procedure   US biopsy showed invasive ductal carcinoma 0.8cm, Nottingham grade 3   07/30/2007 Surgery   She had left breast lumpectomy and LN biopsy which showed high grade invasive ductal cancer Nottingham grade 3, 2.7 cm, ER/PR/Her2 neu negative   11/24/2007 Surgery   Patient  elected for bilateral mastectomy   09/07/2008 - 03/08/2009 Chemotherapy   dates are approximate: she received adriamycin and cytoxan followed by Taxol   03/08/2009 - 05/08/2009 Radiation Therapy   dates are approximate, she received XRT    07/09/2021 Imaging   CT CAP  IMPRESSION: 1. No evidence to suggest locally recurrent ianorectal neoplasm or metastatic disease in the chest, abdomen or pelvis. 2. Status post bilateral modified radical mastectomy and bilateral axillary lymph node dissection. 3. Three small supraumbilical ventral hernias containing only omental fat. No associated bowel incarceration or obstruction at this time. 4. Aortic atherosclerosis. 5. Additional incidental findings, as above.   Breast cancer of upper-outer quadrant of right female breast ([place])  07/10/2006 Procedure   stereotactic biopsy showed atypical hyperplasia   10/23/2006 Surgery   right lumpectomy showed invasive ductal ca (Nottingham grade 1)  7m and DCIS, ER/PR positive Her 2 negative   10/09/2007 - 10/08/2012 Chemotherapy   Patient was placed on Tamoxifen   03/24/2017 Imaging   No acute findings. No evidence of recurrent carcinoma or metastatic disease   07/09/2021 Imaging   CT CAP  IMPRESSION: 1. No evidence to suggest locally recurrent ianorectal neoplasm or metastatic disease in the chest, abdomen or pelvis. 2. Status post bilateral modified radical mastectomy and bilateral axillary lymph node dissection. 3. Three small supraumbilical ventral hernias containing only omental fat. No associated bowel incarceration or obstruction at this time. 4. Aortic atherosclerosis. 5. Additional incidental findings, as above.   Anal cancer ([place]  06/04/2019 Procedure   Colonoscopy per Dr. SAnson Fret Findings-the digital rectal exam revealed a 3 cm diameter firm rectal mass.  The mass was  noncircumferential and located predominantly at the right bowel wall at the anorectal junction. A nonobstructing  mass was found at the anus and in the rectum    06/04/2019 Initial Biopsy   Follow pathology: Large intestine, rectum biopsy: Invasive well to moderately differentiated squamous cell carcinoma.  No rectal mucosa present.  There is strong diffuse expression of P 16 immunostain.  CDX 2, p63 and mCEA immunostains are also used in the diagnostic work-up of the case.   06/04/2019 Initial Diagnosis   Anal cancer ([place])   06/21/2019 PET scan   IMPRESSION: 1. Anorectal primary. No hypermetabolic metastatic disease within the chest, abdomen, or pelvis. Perirectal nodes, at least 1 of which is new since 02/26/2018 CT, suspicious based on size and interval development. 2. Mild limitations secondary to beam hardening artifact from bilateral hip arthroplasty. 3.  Aortic Atherosclerosis (ICD10-I70.0).   06/28/2019 Cancer Staging   Staging form: Anus, AJCC 8th Edition - Clinical stage from 06/28/2019: Stage IIA (cT2, cN0, cM0) - Signed by Alla Feeling, NP on 06/28/2019   06/28/2019 - 07/26/2019 Chemotherapy   concurrent chemoRT with Mitomycin and 5FU on week 1 and week 5 on starting 06/28/19. Last dose on 07/26/19   06/28/2019 - 08/09/2019 Radiation Therapy   concurrent chemoRT with Dr [name] 06/28/19-08/09/19   11/01/2019 PET scan   IMPRESSION: 1. Marked interval decrease in hypermetabolism noted at the level of the anal rectal primary. No evidence for hypermetabolic metastatic disease in the chest, abdomen, or pelvis. The perirectal lymph nodes identified previously have resolved in the interval. 2.  Aortic Atherosclerois (ICD10-170.0)   08/07/2020 Imaging   CT CAP  IMPRESSION: 1. No findings for residual/recurrent anal tumor, regional lymphadenopathy or metastatic disease. 2. Status post cholecystectomy. No biliary dilatation. 3. Small anterior abdominal wall hernia containing fat. 4. Aortic atherosclerosis.   Aortic Atherosclerosis (ICD10-I70.0).     07/09/2021 Imaging   CT  CAP  IMPRESSION: 1. No evidence to suggest locally recurrent ianorectal neoplasm or metastatic disease in the chest, abdomen or pelvis. 2. Status post bilateral modified radical mastectomy and bilateral axillary lymph node dissection. 3. Three small supraumbilical ventral hernias containing only omental fat. No associated bowel incarceration or obstruction at this time. 4. Aortic atherosclerosis. 5. Additional incidental findings, as above.   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)   07/08/2022 -  Chemotherapy   Patient is on Treatment Plan : [place] q21d        HPI  Jessalin Vanaken has been referred to [name] by [name] for evaluation and management of newly diagnosed T-cell non-Hodgkin's lymphoma.  Patient has a history of bilateral breast cancer status post mastectomies.  -2007: s/p right lumpectomy for ADH in 08/2006 showed DCIS ER/PR+, grade 1. 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 She was adopted, no known family history    She subsequently was diagnosed with anal squamous cell carcinoma cT2 N0 M0 diagnosed in September 2020.Workup showed a 3 cm mass at the anorectal junction, biopsy confirmed well to moderately differentiated squamous cell carcinoma. 06/21/19 PET scan showed no metastasis.  -S/p concurrent chemoRT with Mitomycin and 5FU on 08/09/19. Now  on surveillance. -surveillance CT CAP 07/09/21 was NED. -surveillance colonoscopy done for rectal bleeding on 12/07/21 showed only radiation changes. Her rectal bleeding has resolved.  Recently patient presented to  her primary care physician on 05/23/2022 with her new right neck lump which has been progressively enlarging.  She also subsequently developed a left neck swelling.  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  She has had a PET CT scan done on 06/19/2022 which showed hypermetabolic bilateral neck lymphadenopathy.  Also noted to have a new 1 cm focus of metabolic activity in segment 4 of the liver.  This is etiology indeterminate. Lumbar scoliosis and small supraumbilical hernia.  Patient has had Port-A-Cath placement done on 06/25/2022. Echocardiogram was done on 06/17/2022 and shows normal ejection fraction of 70 to 75% with grade 1 diastolic dysfunction.  Right ventricular systolic function within normal limits.  INTERVAL HISTORY  Leotha Shorr is here today for evaluation and management of T-cell non-Hodgkin's lymphoma. She is here to start cycle 6 of CEOP.  Patient was last seen by me on 10/01/2022 and she complained of worsening neuropathy in her hallux of bilateral legs and bilateral hands, worsening urination problems, intermittent left upper abdominal pain after PET scan, and occasional abnormal bowel movement.  Patient reports she has not been doing well since our last visit. She complains of headache, nausea, vomiting, diarrhea, and abdominal pain since Saturday 10/19/2022. She notes that her last episode of vomiting and nausea was around 9:00 am this morning. She thinks she has food poisoning.   Patient also complains of increased fatigue since our last visit. She denies new lumps/bumps, other infection issue, chest pain, back pain, or leg swelling. She complains of abdominal pain upon touch as well.   She notes she tolerated cycle 5 of her treatment without any toxicities.    ROS 10 Point review of Systems was done is negative except as noted above.  MEDICAL HISTORY:   Past Medical History:  Diagnosis Date   Anal cancer ([place]) dx'd 05/2019   Anxiety    Arthritis    Bilateral breast cancer ([place]) 10/21/2013   Breast cancer ([place])    Cancer ([place])    Depression    Gallstones    GERD (gastroesophageal reflux disease)    doesn't take any meds for this   Headache    h/o migraines       History of bladder infections    History of hiatal hernia    History of migraine    Hyperlipidemia    takes Simvastatin daily   Hypertension    takes Hyzaar   Joint pain    Joint swelling    Lumbar stenosis    Neuropathy 09/07/2013   Pneumonia    Pre-diabetes     SURGICAL HISTORY: Past Surgical History:  Procedure Laterality Date   ABDOMINAL HYSTERECTOMY     bone spur removed from left foot     CHOLECYSTECTOMY     COLONOSCOPY     COLONOSCOPY WITH PROPOFOL Bilateral 12/07/2021   Procedure: COLONOSCOPY WITH PROPOFOL;  Surgeon: Clarene Essex, MD;  Location: [facility];  Service: Endoscopy;  Laterality: Bilateral;   double mastectomy      ESOPHAGOGASTRODUODENOSCOPY N/A 12/07/2021   Procedure: ESOPHAGOGASTRODUODENOSCOPY (EGD);  Surgeon: Clarene Essex, MD;  Location: [facility];  Service: Endoscopy;  Laterality: N/A;   HERNIA REPAIR     umbilical   IR IMAGING GUIDED PORT  INSERTION  06/25/2022   right knee arthroscopy     right shoulder arthroscopy     surgery for hiatal hernia     TONSILLECTOMY     TOTAL HIP ARTHROPLASTY Left 02/12/2013   TOTAL HIP ARTHROPLASTY Left 02/12/2013   Procedure: LEFT TOTAL HIP ARTHROPLASTY;  Surgeon: Kerin Salen, MD;  Location: [facility];  Service: Orthopedics;  Laterality: Left;   TOTAL HIP ARTHROPLASTY Right 05/03/2013   Procedure: TOTAL HIP ARTHROPLASTY;  Surgeon: Kerin Salen, MD;  Location: [facility];  Service: Orthopedics;  Laterality: Right;   TOTAL KNEE ARTHROPLASTY Right 02/27/2015   Procedure: TOTAL KNEE ARTHROPLASTY;  Surgeon: Frederik Pear, MD;  Location: [facility];  Service: Orthopedics;  Laterality: Right;   TOTAL SHOULDER ARTHROPLASTY  Right 09/12/2016   Procedure: RIGHT TOTAL SHOULDER ARTHROPLASTY;  Surgeon: Tania Ade, MD;  Location: [facility];  Service: Orthopedics;  Laterality: Right;  RIGHT TOTAL SHOULDER ARTHROPLASTY    I have reviewed the social history and family history with the patient and they are unchanged from previous note.  ALLERGIES:  is allergic to tramadol, codeine, and vicodin [hydrocodone-acetaminophen].  MEDICATIONS:  Current Outpatient Medications  Medication Sig Dispense Refill   acyclovir (ZOVIRAX) 400 MG tablet Take 1 tablet (400 mg total) by mouth daily. 30 tablet 3   aspirin-acetaminophen-caffeine (EXCEDRIN MIGRAINE) 250-250-65 MG tablet Take 2 tablets by mouth every 6 (six) hours as needed for headache.     baclofen (LIORESAL) 10 MG tablet Take 1 tablet (10 mg total) by mouth 3 (three) times daily as needed for muscle spasms. 30 each 0   DULoxetine (CYMBALTA) 30 MG capsule Take 2 capsules (60 mg total) by mouth at bedtime. Take 1 capsule (30 mg) at bedtime for 1 week, if no side effects, then increase to 2 capsules (60 mg) thereafter. 60 capsule 5   famotidine (PEPCID) 20 MG tablet Take 1 tablet (20 mg total) by mouth 2 (two) times daily. 30 tablet 0   HYDROcodone-acetaminophen (NORCO) 5-325 MG tablet Take 1 tablet by mouth every 6 (six) hours as needed for moderate pain. (Patient not taking: Reported on 10/11/2022) 30 tablet 0   lidocaine-prilocaine (EMLA) cream Apply to affected area once (Patient not taking: Reported on 08/20/2022) 30 g 3   loratadine (CLARITIN) 10 MG tablet TAKE 1 TABLET(10 MG) BY MOUTH DAILY 30 tablet 0   losartan (COZAAR) 100 MG tablet Take 100 mg by mouth daily.  6   Menthol-Methyl Salicylate (SALONPAS PAIN RELIEF PATCH EX) Apply 1 patch topically daily as needed (pain).     nystatin (MYCOSTATIN/NYSTOP) powder Apply 1 Application topically 3 (three) times daily. 15 g 0   ondansetron (ZOFRAN) 8 MG tablet Take 1 tablet (8 mg total) by mouth every 8 (eight) hours as needed for  nausea or vomiting. Start on the third day after cyclophosphamide chemotherapy. 30 tablet 1   predniSONE (DELTASONE) 20 MG tablet Take 3 tablets (60 mg total) by mouth daily. Take for 2 days starting the day after chemotherapy on day 4 30 tablet 0   prochlorperazine (COMPAZINE) 10 MG tablet Take 1 tablet (10 mg total) by mouth every 6 (six) hours as needed for nausea or vomiting. 30 tablet 6   Saccharomyces boulardii 500 MG PACK Take 500 mg by mouth 2 (two) times daily with a meal. 60 each 0   senna-docusate (SENNA S) 8.6-50 MG tablet Take 2 tablets by mouth at bedtime. 60 tablet 1   No current facility-administered medications for this visit.  PHYSICAL EXAMINATION: .BP 114/89 (BP Location: Left Arm, Patient Position: Sitting)   Pulse 91   Temp 98.2 F (36.8 C) (Temporal)   Resp 19   Ht 5' 5"$  (1.651 m)   Wt 208 lb 6.4 oz (94.5 kg)   SpO2 99%   BMI 34.68 kg/m  .  NAD GENERAL:alert, in no acute distress and comfortable SKIN: no acute rashes, no significant lesions EYES: conjunctiva are pink and non-injected, sclera anicteric OROPHARYNX: MMM, no exudates, no oropharyngeal erythema or ulceration NECK: supple, no JVD LYMPH:  no palpable lymphadenopathy in the cervical, axillary or inguinal regions LUNGS: clear to auscultation b/l with normal respiratory effort HEART: regular rate & rhythm ABDOMEN:  normoactive bowel sounds , non tender, not distended. Extremity: no pedal edema PSYCH: alert & oriented x 3 with fluent speech NEURO: no focal motor/sensory deficits   LABORATORY DATA:  I have reviewed the data as listed    Latest Ref Rng & Units 10/22/2022   10:35 AM 10/01/2022   11:03 AM 09/10/2022   12:27 PM  CBC  WBC 4.0 - 10.5 K/uL 5.0  4.6  3.8   Hemoglobin 12.0 - 15.0 g/dL 10.8  10.6  10.0   Hematocrit 36.0 - 46.0 % 33.1  33.2  31.3   Platelets 150 - 400 K/uL 281  255  257    Component     Latest Ref Rng 10/22/2022  Sodium     135 - 145 mmol/L 141   Potassium     3.5 -  5.1 mmol/L 3.4 (L)   Chloride     98 - 111 mmol/L 109   CO2     22 - 32 mmol/L 24   Glucose     70 - 99 mg/dL 99   BUN     8 - 23 mg/dL 18   Creatinine     0.44 - 1.00 mg/dL 0.89   Calcium     8.9 - 10.3 mg/dL 9.5   Total Protein     6.5 - 8.1 g/dL 7.2   Albumin     3.5 - 5.0 g/dL 3.7   AST     15 - 41 U/L 17   ALT     0 - 44 U/L 11   Alkaline Phosphatase     38 - 126 U/L 84   Total Bilirubin     0.3 - 1.2 mg/dL 0.3   GFR, Est Non African American     >60 mL/min >60   Anion gap     5 - 15  8   LDH     98 - 192 U/L 202 (H)     Legend: (L) Low (H) High  . Lab Results  Component Value Date   LDH 141 10/01/2022           RADIOGRAPHIC STUDIES: .No results found.   ASSESSMENT & PLAN:  Cynthia Velasquez is a 71 y.o. female with   1.  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 Plan -Results of excisional lymph node biopsy showing peripheral T-cell lymphoma not otherwise specified which is CD30 negative were discussed in detail with the patient. PET CT scan was previously reviewed and she appears to have at least stage II disease but depending on the liver lesion etiology this could represent stage IV disease. She has had anthracycline exposure as a part of her AC regimen for previous treatment of breast cancer and is therefore limited in her total lifetime anthracycline use. CD 30  negative status precludes use of Adcentris. We recommended proceeding with treatment with CEOP and depending on response after 2-3 cycles we could consider using a dose reduced version of anthracyclines with Zinecard for cardiac protection. -We will start the patient on short burst of prednisone to help with decreasing some of her lymph node related pain in the neck pending start of chemotherapy. -Port-A-Cath placed Echo shows normal ejection fraction. -We will need to be mindful of any cytopenias that might arise given patient has had heavy previous treatment with  chemotherapy for both her breast cancer as well as anal squamous cell carcinoma. We will dose reduce etoposide and Cytoxan for her first cycle of treatment and adjust accordingly. -Will need G-CSF support with treatment -We will plan to repeat PET CT scan after 3 cycles of treatment -Chemo therapy orders placed and coordinated.  2. Anal Squamous Cell Carcinoma, cT2N0M0-currently in remission.  Details as noted above   3. 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 Completed tamoxifen 09/2007 - 09/2012 -2008: stage pT2N0 left breast invasive ductal carcinoma, triple negative, grade 3; s/p lumpectomy in 07/2007 and bilateral mastectomy 11/2007 (path negative for residual carcinoma in both breasts); s/p adjuvant chemo AC-T and radiation  Plan:  -Discussed lab results from today, 10/22/2022, with the patient. CBC shows decreased hemoglobin of 10.8 and hematocrit of 33.1. CMP shows slightly decreased potassium of 3.4, probably due to diarrhea.  -Recommended to start probiotics for a while due to possible food poisoning.  -Answered all of patient's questions. -PET CT scan after this cycle. -Patient can proceed with cycle 6 of her treatment without any modifications with same supportive medications.   FOLLOW UP: Pet/ct in 3 weeks RTC with port flush, labs with Dr Irene Limbo in 4 weeks  The total time spent in the appointment was 30 minutes* .  All of the patient's questions were answered with apparent satisfaction. The patient knows to call the clinic with any problems, questions or concerns.   Sullivan Lone MD MS AAHIVMS [facility] Hematology/Oncology Physician [facility]  .*Total Encounter Time as defined by the Centers for Medicare and Medicaid Services includes, in addition to the face-to-face time of a patient visit (documented in the note above) non-face-to-face time: obtaining and reviewing outside  history, ordering and reviewing medications, tests or procedures, care coordination (communications with other health care professionals or caregivers) and documentation in the medical record.   I, Cleda Mccreedy, am acting as a Education administrator for Sullivan Lone, MD. .I have reviewed the above documentation for accuracy and completeness, and I agree with the above. Brunetta Genera MD

## 2022-10-22 NOTE — Patient Instructions (Signed)
Cynthia Velasquez  Discharge Instructions: Thank you for choosing [facility] to provide your oncology and hematology care.   If you have a lab appointment with the [place], please go directly to the [place] and check in at the registration area.   Wear comfortable clothing and clothing appropriate for easy access to any Portacath or PICC line.   We strive to give you quality time with your provider. You may need to reschedule your appointment if you arrive late (15 or more minutes).  Arriving late affects you and other patients whose appointments are after yours.  Also, if you miss three or more appointments without notifying the office, you may be dismissed from the clinic at the provider's discretion.      For prescription refill requests, have your pharmacy contact our office and allow 72 hours for refills to be completed.    Today you received the following chemotherapy and/or immunotherapy agents cytoxan, vincristine, etoposide      To help prevent nausea and vomiting after your treatment, we encourage you to take your nausea medication as directed.  BELOW ARE SYMPTOMS THAT SHOULD BE REPORTED IMMEDIATELY: *FEVER GREATER THAN 100.4 F (38 C) OR HIGHER *CHILLS OR SWEATING *NAUSEA AND VOMITING THAT IS NOT CONTROLLED WITH YOUR NAUSEA MEDICATION *UNUSUAL SHORTNESS OF BREATH *UNUSUAL BRUISING OR BLEEDING *URINARY PROBLEMS (pain or burning when urinating, or frequent urination) *BOWEL PROBLEMS (unusual diarrhea, constipation, pain near the anus) TENDERNESS IN MOUTH AND THROAT WITH OR WITHOUT PRESENCE OF ULCERS (sore throat, sores in mouth, or a toothache) UNUSUAL RASH, SWELLING OR PAIN  UNUSUAL VAGINAL DISCHARGE OR ITCHING   Items with * indicate a potential emergency and should be followed up as soon as possible or go to the Emergency Department if any problems should occur.  Please show the CHEMOTHERAPY ALERT CARD or  IMMUNOTHERAPY ALERT CARD at check-in to the Emergency Department and triage nurse.  Should you have questions after your visit or need to cancel or reschedule your appointment, please contact [facility]  Dept: [phone]  and follow the prompts.  Office hours are 8:00 a.m. to 4:30 p.m. Monday - Friday. Please note that voicemails left after 4:00 p.m. may not be returned until the following business day.  We are closed weekends and major holidays. You have access to a nurse at all times for urgent questions. Please call the main number to the clinic Dept: [phone] and follow the prompts.   For any non-urgent questions, you may also contact your provider using MyChart. We now offer e-Visits for anyone 27 and older to request care online for non-urgent symptoms. For details visit [email or web address].   Also download the MyChart app! Go to the app store, search "MyChart", open the app, select [facility], and log in with your MyChart username and password.

## 2022-10-22 NOTE — Telephone Encounter (Signed)
Called and talked with Pt to see if she had made a decision on [name] doing an order for toilet cover or she just buying one. She is still thinking about it.

## 2022-10-22 NOTE — Progress Notes (Signed)
Patient seen by MD today  Vitals are within treatment parameters.  Labs reviewed: and are within treatment parameters." Dr Irene Limbo aware K= 3.4  Per physician team, patient is ready for treatment and there are NO modifications to the treatment plan.

## 2022-10-23 ENCOUNTER — Other Ambulatory Visit: Payer: Self-pay

## 2022-10-23 ENCOUNTER — Inpatient Hospital Stay: Payer: PPO

## 2022-10-23 VITALS — BP 142/87 | HR 80 | Temp 97.7°F | Resp 18

## 2022-10-23 DIAGNOSIS — Z9989 Dependence on other enabling machines and devices: Secondary | ICD-10-CM | POA: Diagnosis not present

## 2022-10-23 DIAGNOSIS — R7303 Prediabetes: Secondary | ICD-10-CM | POA: Diagnosis not present

## 2022-10-23 DIAGNOSIS — I10 Essential (primary) hypertension: Secondary | ICD-10-CM | POA: Diagnosis not present

## 2022-10-23 DIAGNOSIS — C8448 Peripheral T-cell lymphoma, not classified, lymph nodes of multiple sites: Secondary | ICD-10-CM

## 2022-10-23 DIAGNOSIS — Z6836 Body mass index (BMI) 36.0-36.9, adult: Secondary | ICD-10-CM | POA: Diagnosis not present

## 2022-10-23 DIAGNOSIS — T451X5A Adverse effect of antineoplastic and immunosuppressive drugs, initial encounter: Secondary | ICD-10-CM | POA: Diagnosis not present

## 2022-10-23 DIAGNOSIS — C8590 Non-Hodgkin lymphoma, unspecified, unspecified site: Secondary | ICD-10-CM | POA: Diagnosis not present

## 2022-10-23 DIAGNOSIS — G620 Drug-induced polyneuropathy: Secondary | ICD-10-CM | POA: Diagnosis not present

## 2022-10-23 DIAGNOSIS — D84821 Immunodeficiency due to drugs: Secondary | ICD-10-CM | POA: Diagnosis not present

## 2022-10-23 DIAGNOSIS — Z5111 Encounter for antineoplastic chemotherapy: Secondary | ICD-10-CM | POA: Diagnosis not present

## 2022-10-23 MED ORDER — SODIUM CHLORIDE 0.9 % IV SOLN
80.0000 mg/m2 | Freq: Once | INTRAVENOUS | Status: AC
  Administered 2022-10-23: 170 mg via INTRAVENOUS
  Filled 2022-10-23: qty 8.5

## 2022-10-23 MED ORDER — SODIUM CHLORIDE 0.9% FLUSH
10.0000 mL | INTRAVENOUS | Status: DC | PRN
  Administered 2022-10-23: 10 mL

## 2022-10-23 MED ORDER — HEPARIN SOD (PORK) LOCK FLUSH 100 UNIT/ML IV SOLN
500.0000 [IU] | Freq: Once | INTRAVENOUS | Status: AC | PRN
  Administered 2022-10-23: 500 [IU]

## 2022-10-23 MED ORDER — SODIUM CHLORIDE 0.9 % IV SOLN: Freq: Once | INTRAVENOUS | Status: AC

## 2022-10-23 MED ORDER — SODIUM CHLORIDE 0.9 % IV SOLN
10.0000 mg | Freq: Once | INTRAVENOUS | Status: AC
  Administered 2022-10-23: 10 mg via INTRAVENOUS
  Filled 2022-10-23: qty 10

## 2022-10-23 MED FILL — Dexamethasone Sodium Phosphate Inj 100 MG/10ML: INTRAMUSCULAR | Qty: 1 | Status: AC

## 2022-10-23 NOTE — Patient Instructions (Signed)
[facility]  Discharge Instructions: Thank you for choosing [facility] to provide your oncology and hematology care.   If you have a lab appointment with the Cancer Center, please go directly to the Cancer Center and check in at the registration area.   Wear comfortable clothing and clothing appropriate for easy access to any Portacath or PICC line.   We strive to give you quality time with your provider. You may need to reschedule your appointment if you arrive late (15 or more minutes).  Arriving late affects you and other patients whose appointments are after yours.  Also, if you miss three or more appointments without notifying the office, you may be dismissed from the clinic at the provider's discretion.      For prescription refill requests, have your pharmacy contact our office and allow 72 hours for refills to be completed.    Today you received the following chemotherapy and/or immunotherapy agents; Etoposide      To help prevent nausea and vomiting after your treatment, we encourage you to take your nausea medication as directed.  BELOW ARE SYMPTOMS THAT SHOULD BE REPORTED IMMEDIATELY: *FEVER GREATER THAN 100.4 F (38 C) OR HIGHER *CHILLS OR SWEATING *NAUSEA AND VOMITING THAT IS NOT CONTROLLED WITH YOUR NAUSEA MEDICATION *UNUSUAL SHORTNESS OF BREATH *UNUSUAL BRUISING OR BLEEDING *URINARY PROBLEMS (pain or burning when urinating, or frequent urination) *BOWEL PROBLEMS (unusual diarrhea, constipation, pain near the anus) TENDERNESS IN MOUTH AND THROAT WITH OR WITHOUT PRESENCE OF ULCERS (sore throat, sores in mouth, or a toothache) UNUSUAL RASH, SWELLING OR PAIN  UNUSUAL VAGINAL DISCHARGE OR ITCHING   Items with * indicate a potential emergency and should be followed up as soon as possible or go to the Emergency Department if any problems should occur.  Please show the CHEMOTHERAPY ALERT CARD or IMMUNOTHERAPY ALERT CARD at  check-in to the Emergency Department and triage nurse.  Should you have questions after your visit or need to cancel or reschedule your appointment, please contact [facility]  Dept: [phone]  and follow the prompts.  Office hours are 8:00 a.m. to 4:30 p.m. Monday - Friday. Please note that voicemails left after 4:00 p.m. may not be returned until the following business day.  We are closed weekends and major holidays. You have access to a nurse at all times for urgent questions. Please call the main number to the clinic Dept: [phone] and follow the prompts.   For any non-urgent questions, you may also contact your provider using MyChart. We now offer e-Visits for anyone 18 and older to request care online for non-urgent symptoms. For details visit [email or web address].   Also download the MyChart app! Go to the app store, search "MyChart", open the app, select [facility], and log in with your MyChart username and password.   

## 2022-10-24 ENCOUNTER — Other Ambulatory Visit: Payer: Self-pay

## 2022-10-24 ENCOUNTER — Inpatient Hospital Stay: Payer: PPO

## 2022-10-24 ENCOUNTER — Encounter: Payer: Self-pay | Admitting: Hematology

## 2022-10-24 ENCOUNTER — Other Ambulatory Visit: Payer: Self-pay | Admitting: *Deleted

## 2022-10-24 ENCOUNTER — Inpatient Hospital Stay (HOSPITAL_BASED_OUTPATIENT_CLINIC_OR_DEPARTMENT_OTHER): Payer: PPO | Admitting: Physician Assistant

## 2022-10-24 ENCOUNTER — Other Ambulatory Visit: Payer: Self-pay | Admitting: Hematology

## 2022-10-24 VITALS — BP 153/88 | HR 72 | Temp 98.1°F | Resp 16

## 2022-10-24 VITALS — BP 142/95 | HR 73 | Temp 98.1°F | Resp 16

## 2022-10-24 DIAGNOSIS — E876 Hypokalemia: Secondary | ICD-10-CM

## 2022-10-24 DIAGNOSIS — R197 Diarrhea, unspecified: Secondary | ICD-10-CM

## 2022-10-24 DIAGNOSIS — R112 Nausea with vomiting, unspecified: Secondary | ICD-10-CM

## 2022-10-24 DIAGNOSIS — N390 Urinary tract infection, site not specified: Secondary | ICD-10-CM

## 2022-10-24 DIAGNOSIS — Z170 Estrogen receptor positive status [ER+]: Secondary | ICD-10-CM

## 2022-10-24 DIAGNOSIS — C210 Malignant neoplasm of anus, unspecified: Secondary | ICD-10-CM

## 2022-10-24 DIAGNOSIS — Z95828 Presence of other vascular implants and grafts: Secondary | ICD-10-CM

## 2022-10-24 DIAGNOSIS — C8448 Peripheral T-cell lymphoma, not classified, lymph nodes of multiple sites: Secondary | ICD-10-CM

## 2022-10-24 DIAGNOSIS — C50411 Malignant neoplasm of upper-outer quadrant of right female breast: Secondary | ICD-10-CM

## 2022-10-24 DIAGNOSIS — Z5111 Encounter for antineoplastic chemotherapy: Secondary | ICD-10-CM | POA: Diagnosis not present

## 2022-10-24 LAB — URINALYSIS, COMPLETE (UACMP) WITH MICROSCOPIC
Bilirubin Urine: NEGATIVE
Glucose, UA: NEGATIVE mg/dL
Hgb urine dipstick: NEGATIVE
Ketones, ur: NEGATIVE mg/dL
Nitrite: NEGATIVE
Protein, ur: NEGATIVE mg/dL
Specific Gravity, Urine: 1.018 (ref 1.005–1.030)
pH: 5 (ref 5.0–8.0)

## 2022-10-24 LAB — CBC WITH DIFFERENTIAL (CANCER CENTER ONLY)
Abs Immature Granulocytes: 0.01 10*3/uL (ref 0.00–0.07)
Basophils Absolute: 0 10*3/uL (ref 0.0–0.1)
Basophils Relative: 0 %
Eosinophils Absolute: 0 10*3/uL (ref 0.0–0.5)
Eosinophils Relative: 0 %
HCT: 28.3 % — ABNORMAL LOW (ref 36.0–46.0)
Hemoglobin: 9.3 g/dL — ABNORMAL LOW (ref 12.0–15.0)
Immature Granulocytes: 0 %
Lymphocytes Relative: 13 %
Lymphs Abs: 0.5 10*3/uL — ABNORMAL LOW (ref 0.7–4.0)
MCH: 28.4 pg (ref 26.0–34.0)
MCHC: 32.9 g/dL (ref 30.0–36.0)
MCV: 86.5 fL (ref 80.0–100.0)
Monocytes Absolute: 0.3 10*3/uL (ref 0.1–1.0)
Monocytes Relative: 9 %
Neutro Abs: 2.9 10*3/uL (ref 1.7–7.7)
Neutrophils Relative %: 78 %
Platelet Count: 260 10*3/uL (ref 150–400)
RBC: 3.27 MIL/uL — ABNORMAL LOW (ref 3.87–5.11)
RDW: 15.9 % — ABNORMAL HIGH (ref 11.5–15.5)
WBC Count: 3.8 10*3/uL — ABNORMAL LOW (ref 4.0–10.5)
nRBC: 0 % (ref 0.0–0.2)

## 2022-10-24 LAB — CMP (CANCER CENTER ONLY)
ALT: 10 U/L (ref 0–44)
AST: 18 U/L (ref 15–41)
Albumin: 3.3 g/dL — ABNORMAL LOW (ref 3.5–5.0)
Alkaline Phosphatase: 64 U/L (ref 38–126)
Anion gap: 8 (ref 5–15)
BUN: 20 mg/dL (ref 8–23)
CO2: 22 mmol/L (ref 22–32)
Calcium: 8.4 mg/dL — ABNORMAL LOW (ref 8.9–10.3)
Chloride: 109 mmol/L (ref 98–111)
Creatinine: 0.76 mg/dL (ref 0.44–1.00)
GFR, Estimated: >60 mL/min (ref >60)
Glucose, Bld: 72 mg/dL (ref 70–99)
Potassium: 3.1 mmol/L — ABNORMAL LOW (ref 3.5–5.1)
Sodium: 139 mmol/L (ref 135–145)
Total Bilirubin: 0.5 mg/dL (ref 0.3–1.2)
Total Protein: 6.1 g/dL — ABNORMAL LOW (ref 6.5–8.1)

## 2022-10-24 LAB — MAGNESIUM: Magnesium: 1.5 mg/dL — ABNORMAL LOW (ref 1.7–2.4)

## 2022-10-24 LAB — C DIFFICILE QUICK SCREEN W PCR REFLEX
C Diff antigen: NEGATIVE
C Diff interpretation: NOT DETECTED
C Diff toxin: NEGATIVE

## 2022-10-24 MED ORDER — SODIUM CHLORIDE 0.9% FLUSH
10.0000 mL | Freq: Once | INTRAVENOUS | Status: AC
  Administered 2022-10-24: 10 mL

## 2022-10-24 MED ORDER — SODIUM CHLORIDE 0.9 % IV SOLN: Freq: Once | INTRAVENOUS | Status: DC

## 2022-10-24 MED ORDER — HEPARIN SOD (PORK) LOCK FLUSH 100 UNIT/ML IV SOLN
500.0000 [IU] | Freq: Once | INTRAVENOUS | Status: AC
  Administered 2022-10-24: 500 [IU]

## 2022-10-24 MED ORDER — SODIUM CHLORIDE 0.9 % IV SOLN: Freq: Once | INTRAVENOUS | Status: AC

## 2022-10-24 MED ORDER — POTASSIUM CHLORIDE 10 MEQ/100ML IV SOLN
10.0000 meq | Freq: Once | INTRAVENOUS | Status: AC
  Administered 2022-10-24: 10 meq via INTRAVENOUS
  Filled 2022-10-24: qty 100

## 2022-10-24 MED ORDER — POTASSIUM CHLORIDE CRYS ER 20 MEQ PO TBCR: 20.0000 meq | EXTENDED_RELEASE_TABLET | Freq: Two times a day (BID) | ORAL | 0 refills | Status: DC

## 2022-10-24 MED ORDER — ONDANSETRON HCL 4 MG/2ML IJ SOLN: 4.0000 mg | Freq: Once | INTRAMUSCULAR | Status: DC

## 2022-10-24 MED ORDER — PEGFILGRASTIM-CBQV 6 MG/0.6ML ~~LOC~~ SOSY
6.0000 mg | PREFILLED_SYRINGE | Freq: Once | SUBCUTANEOUS | Status: AC
  Administered 2022-10-24: 6 mg via SUBCUTANEOUS
  Filled 2022-10-24: qty 0.6

## 2022-10-24 MED ORDER — ONDANSETRON HCL 4 MG/2ML IJ SOLN
4.0000 mg | Freq: Once | INTRAMUSCULAR | Status: AC
  Administered 2022-10-24: 4 mg via INTRAVENOUS
  Filled 2022-10-24: qty 2

## 2022-10-24 MED ORDER — NITROFURANTOIN MONOHYD MACRO 100 MG PO CAPS: 100.0000 mg | ORAL_CAPSULE | Freq: Two times a day (BID) | ORAL | 0 refills | Status: DC

## 2022-10-24 NOTE — Patient Instructions (Signed)
[facility]  Discharge Instructions: Thank you for choosing [facility] to provide your oncology and hematology care.   If you have a lab appointment with the [facility], please go directly to the [facility] and check in at the registration area.   Wear comfortable clothing and clothing appropriate for easy access to any Portacath or PICC line.   We strive to give you quality time with your provider. You may need to reschedule your appointment if you arrive late (15 or more minutes).  Arriving late affects you and other patients whose appointments are after yours.  Also, if you miss three or more appointments without notifying the office, you may be dismissed from the clinic at the provider's discretion.      For prescription refill requests, have your pharmacy contact our office and allow 72 hours for refills to be completed.    Today you received the following chemotherapy and/or immunotherapy agents: IV Fluids (1 Liter), Potassium Chloride 36mq, '4mg'$  IV Zofran. Udenyca.      To help prevent nausea and vomiting after your treatment, we encourage you to take your nausea medication as directed.  BELOW ARE SYMPTOMS THAT SHOULD BE REPORTED IMMEDIATELY: *FEVER GREATER THAN 100.4 F (38 C) OR HIGHER *CHILLS OR SWEATING *NAUSEA AND VOMITING THAT IS NOT CONTROLLED WITH YOUR NAUSEA MEDICATION *UNUSUAL SHORTNESS OF BREATH *UNUSUAL BRUISING OR BLEEDING *URINARY PROBLEMS (pain or burning when urinating, or frequent urination) *BOWEL PROBLEMS (unusual diarrhea, constipation, pain near the anus) TENDERNESS IN MOUTH AND THROAT WITH OR WITHOUT PRESENCE OF ULCERS (sore throat, sores in mouth, or a toothache) UNUSUAL RASH, SWELLING OR PAIN  UNUSUAL VAGINAL DISCHARGE OR ITCHING   Items with * indicate a potential emergency and should be followed up as soon as possible or go to the Emergency Department if any problems should occur.  Please show the  CHEMOTHERAPY ALERT CARD or IMMUNOTHERAPY ALERT CARD at check-in to the Emergency Department and triage nurse.  Should you have questions after your visit or need to cancel or reschedule your appointment, please contact [facility] Dept: [phone] and follow the prompts.  Office hours are 8:00 a.m. to 4:30 p.m. Monday - Friday. Please note that voicemails left after 4:00 p.m. may not be returned until the following business day.  We are closed weekends and major holidays. You have access to a nurse at all times for urgent questions. Please call the main number to the clinic Dept: [phone] follow the prompts.   For any non-urgent questions, you may also contact your provider using MyChart. We now offer e-Visits for anyone 143and older to request care online for non-urgent symptoms. For details visit [email or web address]   Also download the MyChart app! Go to the app store, search "MyChart", open the app, select [facility], and log in with your MyChart username and password.

## 2022-10-24 NOTE — Progress Notes (Addendum)
Symptom Management Consult note [place]    Patient Care Team: Kathyrn Lass, MD as PCP - General (Family Medicine) Brunetta Genera, MD as PCP - Hematology/Oncology (Hematology) Wonda Horner, MD as Consulting Physician (Gastroenterology) Truitt Merle, MD as Consulting Physician (Hematology) Alla Feeling, NP as Nurse Practitioner (Nurse Practitioner) Kyung Rudd, MD as Consulting Physician (Radiation Oncology) Michael Boston, MD as Consulting Physician (General Surgery) Izora Gala, MD as Attending Physician (Otolaryngology)    Name / MRN / DOB: Cynthia Velasquez  [id number]  May 17, 1951   Date of visit: 10/24/2022   Chief Complaint/Reason for visit: nausea and diarrhea   Current Therapy: Cytoxan and etoposide  Last treatment:  Day 2   Cycle 6 on 10/23/22   ASSESSMENT & PLAN: Patient is a 72 y.o. female  with oncologic history of breast cancer (2014), anal cancer (2020), currently treated for T cell lymphoma  followed by Dr. Irene Limbo.  I have viewed most recent oncology note and lab work.    #T cell lymphoma  -Discussed with Dr. Irene Limbo and he agrees to hold treatment today secondary to her symptoms. Patient will receive Udyenca injection today as CBC is showing mild leukopenia 3.8. -Patient will follow up with oncologist for PET.  #UTI -Exam without abdominal tenderness or CVA tenderness -UA is suggestive of UTI with small leukocytes, 21-50 WBC and many bacteria. Patient has not noticed urinary symptoms although feels so poorly thinks she could be overlooking them. She has history of recurrent UTI. I engaged in shared decision making with patient on treatment options including starting antibiotic now vs waiting for the culture. Patient prefers to start antibiotic with her history. Macrobid ordered and I will follow up on the urine culture.  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  Abdominal exam is benign.  No CVA tenderness.  She clinically appears dehydrated. -Patient given a liter of IV fluids and Zofran in infusion center. -CMP shows mild hypokalemia 3.2.  Patient given 1 round of IV potassium as she has ongoing GI losses and will give prescription for short course of p.o.  She does also have mild hypomagnesemia at 1.5 although will hold off on giving PO mag oxide as it can cause further diarrhea and abdominal upset. If patient continues to have diarrhea she knows to RTC for repeat labs and IVF. -C diff and GI stool PCR testing are in process and I will follow up on results. .If results are negative she can resume Imodium and start probiotic.  Strict ED precautions discussed should symptoms worsen.   Heme/Onc History: Oncology History Overview Note  Cancer Staging Anal cancer ([place]) Staging form: Anus, AJCC 8th Edition - Clinical stage from 06/28/2019: Stage IIA (cT2, cN0, cM0) - Signed by Alla Feeling, NP on 06/28/2019  Breast cancer of upper-outer quadrant of left female breast [facility]) Staging form: Breast, AJCC 7th Edition - Clinical stage from 09/07/2013: Stage IIA (T2, N0, cM0) - Signed by [name], MD on 09/07/2013 - Pathologic: Stage IIA (T2, N0, cM0) - Signed by [name], MD on 09/07/2013  Breast cancer of upper-outer quadrant of right female breast [facility]) Staging form: Breast, AJCC 7th Edition - Clinical stage from 09/07/2013: Stage IA (T1a, N0, cM0) - Signed by [name], MD on 09/07/2013 - Pathologic: Stage IA (T1a, N0, cM0) - Signed by Alvy Bimler  Ni, MD on 09/07/2013    Breast cancer of upper-outer quadrant of left female breast ([place])  07/10/2007 Procedure   US biopsy showed invasive ductal carcinoma 0.8cm, Nottingham grade 3   07/30/2007 Surgery   She had left breast lumpectomy and LN biopsy which showed high grade  invasive ductal cancer Nottingham grade 3, 2.7 cm, ER/PR/Her2 neu negative   11/24/2007 Surgery   Patient elected for bilateral mastectomy   09/07/2008 - 03/08/2009 Chemotherapy   dates are approximate: she received adriamycin and cytoxan followed by Taxol   03/08/2009 - 05/08/2009 Radiation Therapy   dates are approximate, she received XRT    07/09/2021 Imaging   CT CAP  IMPRESSION: 1. No evidence to suggest locally recurrent ianorectal neoplasm or metastatic disease in the chest, abdomen or pelvis. 2. Status post bilateral modified radical mastectomy and bilateral axillary lymph node dissection. 3. Three small supraumbilical ventral hernias containing only omental fat. No associated bowel incarceration or obstruction at this time. 4. Aortic atherosclerosis. 5. Additional incidental findings, as above.   Breast cancer of upper-outer quadrant of right female breast ([place])  07/10/2006 Procedure   stereotactic biopsy showed atypical hyperplasia   10/23/2006 Surgery   right lumpectomy showed invasive ductal ca (Nottingham grade 1)  16m and DCIS, ER/PR positive Her 2 negative   10/09/2007 - 10/08/2012 Chemotherapy   Patient was placed on Tamoxifen   03/24/2017 Imaging   No acute findings. No evidence of recurrent carcinoma or metastatic disease   07/09/2021 Imaging   CT CAP  IMPRESSION: 1. No evidence to suggest locally recurrent ianorectal neoplasm or metastatic disease in the chest, abdomen or pelvis. 2. Status post bilateral modified radical mastectomy and bilateral axillary lymph node dissection. 3. Three small supraumbilical ventral hernias containing only omental fat. No associated bowel incarceration or obstruction at this time. 4. Aortic atherosclerosis. 5. Additional incidental findings, as above.   Anal cancer ([place]  06/04/2019 Procedure   Colonoscopy per Dr. SAnson Fret Findings-the digital rectal exam revealed a 3 cm diameter firm rectal mass.  The mass was  noncircumferential and located predominantly at the right bowel wall at the anorectal junction. A nonobstructing mass was found at the anus and in the rectum    06/04/2019 Initial Biopsy   Follow pathology: Large intestine, rectum biopsy: Invasive well to moderately differentiated squamous cell carcinoma.  No rectal mucosa present.  There is strong diffuse expression of P 16 immunostain.  CDX 2, p63 and mCEA immunostains are also used in the diagnostic work-up of the case.   06/04/2019 Initial Diagnosis   [place] cancer ([place]   06/21/2019 PET scan   IMPRESSION: 1. Anorectal primary. No hypermetabolic metastatic disease within the chest, abdomen, or pelvis. Perirectal nodes, at least 1 of which is new since 02/26/2018 CT, suspicious based on size and interval development. 2. Mild limitations secondary to beam hardening artifact from bilateral hip arthroplasty. 3.  Aortic Atherosclerosis (ICD10-I70.0).   06/28/2019 Cancer Staging   Staging form: Anus, AJCC 8th Edition - Clinical stage from 06/28/2019: Stage IIA (cT2, cN0, cM0) - Signed by BAlla Feeling NP on 06/28/2019   06/28/2019 - 07/26/2019 Chemotherapy   concurrent chemoRT with Mitomycin and 5FU on week 1 and week 5 on starting 06/28/19. Last dose on 07/26/19   06/28/2019 - 08/09/2019 Radiation Therapy   concurrent chemoRT with Dr MLisbeth Renshaw10/12/20-11/23/20   11/01/2019 PET scan   IMPRESSION: 1. Marked interval decrease in hypermetabolism noted at the level of the anal rectal primary. No evidence for  hypermetabolic metastatic disease in the chest, abdomen, or pelvis. The perirectal lymph nodes identified previously have resolved in the interval. 2.  Aortic Atherosclerois (ICD10-170.0)   08/07/2020 Imaging   CT CAP  IMPRESSION: 1. No findings for residual/recurrent anal tumor, regional lymphadenopathy or metastatic disease. 2. Status post cholecystectomy. No biliary dilatation. 3. Small anterior abdominal wall hernia containing  fat. 4. Aortic atherosclerosis.   Aortic Atherosclerosis (ICD10-I70.0).     07/09/2021 Imaging   CT CAP  IMPRESSION: 1. No evidence to suggest locally recurrent ianorectal neoplasm or metastatic disease in the chest, abdomen or pelvis. 2. Status post bilateral modified radical mastectomy and bilateral axillary lymph node dissection. 3. Three small supraumbilical ventral hernias containing only omental fat. No associated bowel incarceration or obstruction at this time. 4. Aortic atherosclerosis. 5. Additional incidental findings, as above.   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 female with oncologic history as above seen in the infusion center today with chief complaint of nausea with vomiting and diarrhea.  Her daughter accompanies her and provides additional history.  Patient states she has had intermittent nausea with vomiting x 4 days.  In the last 24 hours she has vomited 1 time.  She has been taking Compazine at home which gives her temporary relief.  She does have a prescription for Zofran at home as well although has not tried taking it this time.  She admits to decreased p.o. intake secondary to nausea. Patient states her diarrhea started yesterday.  She has had over 10 episodes of loose stool in the last 24 hours.  She describes stool as liquid and yellow without significant malodor.  Patient is currently taking Augmentin for sinus infection, otherwise no new antibiotic use.  Patient admits to abdominal cramping immediately proceeding diarrhea episodes.  After having a bowel movement the cramping subsides.  She admits to having cramping with diarrhea in the past.  She took imodium yesterday and does not  think it is helping. She denies any recent travel or sick contacts.  Denies any abdominal pain currently. Patient denies any sick contacts.  Denies any fever, chills, urinary symptoms.   ROS  All other systems are reviewed and are negative for acute change except as noted in the HPI.    Allergies  Allergen Reactions   Tramadol Itching and Rash   Codeine Itching and Rash   Vicodin [Hydrocodone-Acetaminophen] Itching and Rash     Past Medical History:  Diagnosis Date   Anal cancer ([place]) dx'd 05/2019   Anxiety    Arthritis    Bilateral breast cancer ([place]) 10/21/2013   Breast cancer ([place])    Cancer ([place])    Depression    Gallstones    GERD (gastroesophageal reflux disease)    doesn't take any meds for this   Headache    h/o migraines       History of bladder infections    History of hiatal hernia    History of migraine    Hyperlipidemia    takes Simvastatin daily   Hypertension    takes Hyzaar   Joint pain    Joint swelling    Lumbar stenosis    Neuropathy 09/07/2013  Pneumonia    Pre-diabetes      Past Surgical History:  Procedure Laterality Date   ABDOMINAL HYSTERECTOMY     bone spur removed from left foot     CHOLECYSTECTOMY     COLONOSCOPY     COLONOSCOPY WITH PROPOFOL Bilateral 12/07/2021   Procedure: COLONOSCOPY WITH PROPOFOL;  Surgeon: Clarene Essex, MD;  Location: [facility];  Service: Endoscopy;  Laterality: Bilateral;   double mastectomy      ESOPHAGOGASTRODUODENOSCOPY N/A 12/07/2021   Procedure: ESOPHAGOGASTRODUODENOSCOPY (EGD);  Surgeon: Clarene Essex, MD;  Location: [facility];  Service: Endoscopy;  Laterality: N/A;   HERNIA REPAIR     umbilical   IR IMAGING GUIDED PORT INSERTION  06/25/2022   right knee arthroscopy     right shoulder arthroscopy     surgery for hiatal hernia     TONSILLECTOMY     TOTAL HIP ARTHROPLASTY Left 02/12/2013   TOTAL HIP ARTHROPLASTY Left 02/12/2013   Procedure: LEFT TOTAL HIP ARTHROPLASTY;  Surgeon: Kerin Salen, MD;   Location: [facility];  Service: Orthopedics;  Laterality: Left;   TOTAL HIP ARTHROPLASTY Right 05/03/2013   Procedure: TOTAL HIP ARTHROPLASTY;  Surgeon: Kerin Salen, MD;  Location: [facility];  Service: Orthopedics;  Laterality: Right;   TOTAL KNEE ARTHROPLASTY Right 02/27/2015   Procedure: TOTAL KNEE ARTHROPLASTY;  Surgeon: Frederik Pear, MD;  Location: [facility];  Service: Orthopedics;  Laterality: Right;   TOTAL SHOULDER ARTHROPLASTY Right 09/12/2016   Procedure: RIGHT TOTAL SHOULDER ARTHROPLASTY;  Surgeon: Tania Ade, MD;  Location: [facility];  Service: Orthopedics;  Laterality: Right;  RIGHT TOTAL SHOULDER ARTHROPLASTY    Social History   Socioeconomic History   Marital status: Single    Spouse name: Not on file   Number of children: 1   Years of education: Not on file   Highest education level: Not on file  Occupational History   Not on file  Tobacco Use   Smoking status: Never   Smokeless tobacco: Never  Vaping Use   Vaping Use: Never used  Substance and Sexual Activity   Alcohol use: No    Comment: occ   Drug use: No   Sexual activity: Not Currently  Other Topics Concern   Not on file  Social History Narrative   Are you right handed or left handed? Right   Are you currently employed ? no   What is your current occupation?retired   Do you live at home alone? no   Who lives with you? Daughter and granddaughter   What type of home do you live in: 1 story or 2 story? one   Caffeine 1 cup a day   Social Determinants of Health   Financial Resource Strain: Not on file  Food Insecurity: Not on file  Transportation Needs: No Transportation Needs (06/17/2019)   PRAPARE - Hydrologist (Medical): No    Lack of Transportation (Non-Medical): No  Physical Activity: Not on file  Stress: Not on file  Social Connections: Not on file  Intimate Partner Violence: Not on file    Family History  Adopted: Yes  Problem Relation Age of Onset   Allergic rhinitis Neg  Hx    Angioedema Neg Hx    Atopy Neg Hx    Eczema Neg Hx    Immunodeficiency Neg Hx    Urticaria Neg Hx    Asthma Neg Hx      Current Outpatient Medications:    nitrofurantoin, macrocrystal-monohydrate, (MACROBID) 100  MG capsule, Take 1 capsule (100 mg total) by mouth 2 (two) times daily for 5 days., Disp: 10 capsule, Rfl: 0   potassium chloride SA (KLOR-CON M) 20 MEQ tablet, Take 1 tablet (20 mEq total) by mouth 2 (two) times daily for 7 days., Disp: 14 tablet, Rfl: 0   acyclovir (ZOVIRAX) 400 MG tablet, Take 1 tablet (400 mg total) by mouth daily., Disp: 30 tablet, Rfl: 3   aspirin-acetaminophen-caffeine (EXCEDRIN MIGRAINE) 250-250-65 MG tablet, Take 2 tablets by mouth every 6 (six) hours as needed for headache., Disp: , Rfl:    baclofen (LIORESAL) 10 MG tablet, Take 1 tablet (10 mg total) by mouth 3 (three) times daily as needed for muscle spasms., Disp: 30 each, Rfl: 0   DULoxetine (CYMBALTA) 30 MG capsule, Take 2 capsules (60 mg total) by mouth at bedtime. Take 1 capsule (30 mg) at bedtime for 1 week, if no side effects, then increase to 2 capsules (60 mg) thereafter., Disp: 60 capsule, Rfl: 5   famotidine (PEPCID) 20 MG tablet, Take 1 tablet (20 mg total) by mouth 2 (two) times daily., Disp: 30 tablet, Rfl: 0   HYDROcodone-acetaminophen (NORCO) 5-325 MG tablet, Take 1 tablet by mouth every 6 (six) hours as needed for moderate pain. (Patient not taking: Reported on 10/11/2022), Disp: 30 tablet, Rfl: 0   lidocaine-prilocaine (EMLA) cream, Apply to affected area once (Patient not taking: Reported on 08/20/2022), Disp: 30 g, Rfl: 3   loratadine (CLARITIN) 10 MG tablet, TAKE 1 TABLET(10 MG) BY MOUTH DAILY, Disp: 30 tablet, Rfl: 0   losartan (COZAAR) 100 MG tablet, Take 100 mg by mouth daily., Disp: , Rfl: 6   Menthol-Methyl Salicylate (SALONPAS PAIN RELIEF PATCH EX), Apply 1 patch topically daily as needed (pain)., Disp: , Rfl:    nystatin (MYCOSTATIN/NYSTOP) powder, Apply 1 Application  topically 3 (three) times daily., Disp: 15 g, Rfl: 0   ondansetron (ZOFRAN) 8 MG tablet, Take 1 tablet (8 mg total) by mouth every 8 (eight) hours as needed for nausea or vomiting. Start on the third day after cyclophosphamide chemotherapy., Disp: 30 tablet, Rfl: 1   predniSONE (DELTASONE) 20 MG tablet, Take 3 tablets (60 mg total) by mouth daily. Take for 2 days starting the day after chemotherapy on day 4, Disp: 30 tablet, Rfl: 0   prochlorperazine (COMPAZINE) 10 MG tablet, Take 1 tablet (10 mg total) by mouth every 6 (six) hours as needed for nausea or vomiting., Disp: 30 tablet, Rfl: 6   Saccharomyces boulardii 500 MG PACK, Take 500 mg by mouth 2 (two) times daily with a meal., Disp: 60 each, Rfl: 0   senna-docusate (SENNA S) 8.6-50 MG tablet, Take 2 tablets by mouth at bedtime., Disp: 60 tablet, Rfl: 1 No current facility-administered medications for this visit.  Facility-Administered Medications Ordered in Other Visits:    0.9 %  sodium chloride infusion, , Intravenous, Once, Walisiewicz, Jone Panebianco E, PA-C   ondansetron (ZOFRAN) injection 4 mg, 4 mg, Intravenous, Once, Walisiewicz, Isabella Ida E, PA-C  PHYSICAL EXAM: ECOG FS:1 - Symptomatic but completely ambulatory    Vitals:   10/24/22 1334  BP: (!) 153/88  Pulse: 72  Resp: 16  Temp: 98.1 F (36.7 C)  TempSrc: Oral  SpO2: 97%   Physical Exam Vitals and nursing note reviewed.  Constitutional:      Appearance: She is well-developed. She is not ill-appearing or toxic-appearing.  HENT:     Head: Normocephalic.     Nose: Nose normal.     Mouth/Throat:  Mouth: Mucous membranes are dry.  Eyes:     Conjunctiva/sclera: Conjunctivae normal.  Neck:     Vascular: No JVD.  Cardiovascular:     Rate and Rhythm: Normal rate and regular rhythm.     Pulses: Normal pulses.     Heart sounds: Normal heart sounds.  Pulmonary:     Effort: Pulmonary effort is normal.     Breath sounds: Normal breath sounds.  Abdominal:     General: Bowel  sounds are normal. There is no distension.     Palpations: Abdomen is soft. There is no mass.     Tenderness: There is no abdominal tenderness. There is no right CVA tenderness, left CVA tenderness, guarding or rebound.     Hernia: No hernia is present.  Musculoskeletal:     Cervical back: Normal range of motion.  Skin:    General: Skin is warm and dry.  Neurological:     Mental Status: She is oriented to person, place, and time.        LABORATORY DATA: I have reviewed the data as listed    Latest Ref Rng & Units 10/24/2022    1:45 PM 10/22/2022   10:35 AM 10/01/2022   11:03 AM  CBC  WBC 4.0 - 10.5 K/uL 3.8  5.0  4.6   Hemoglobin 12.0 - 15.0 g/dL 9.3  10.8  10.6   Hematocrit 36.0 - 46.0 % 28.3  33.1  33.2   Platelets 150 - 400 K/uL 260  281  255         Latest Ref Rng & Units 10/24/2022    1:45 PM 10/22/2022   10:35 AM 10/01/2022   11:03 AM  CMP  Glucose 70 - 99 mg/dL 72  99  97   BUN 8 - 23 mg/dL 20  18  16   $ Creatinine 0.44 - 1.00 mg/dL 0.76  0.89  0.90   Sodium 135 - 145 mmol/L 139  141  140   Potassium 3.5 - 5.1 mmol/L 3.1  3.4  3.8   Chloride 98 - 111 mmol/L 109  109  108   CO2 22 - 32 mmol/L 22  24  25   $ Calcium 8.9 - 10.3 mg/dL 8.4  9.5  9.2   Total Protein 6.5 - 8.1 g/dL 6.1  7.2  6.7   Total Bilirubin 0.3 - 1.2 mg/dL 0.5  0.3  0.3   Alkaline Phos 38 - 126 U/L 64  84  84   AST 15 - 41 U/L 18  17  12   $ ALT 0 - 44 U/L 10  11  9        $ RADIOGRAPHIC STUDIES (from last 24 hours if applicable) I have personally reviewed the radiological images as listed and agreed with the findings in the report. No results found.      Visit Diagnosis: 1. Diarrhea of presumed infectious origin   2. Hypokalemia   3. 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 Urinary tract infection without hematuria, site unspecified   5. Nausea and vomiting, unspecified vomiting type      No orders of the defined types were placed in this encounter.   All  questions were answered. The patient knows to call the clinic with any problems, questions or concerns. No barriers to learning was detected.  I have spent a total of 30 minutes minutes of face-to-face and non-face-to-face time, preparing to see the patient, obtaining and/or reviewing separately  obtained history, performing a medically appropriate examination, counseling and educating the patient, ordering tests, documenting clinical information in the electronic health record, and care coordination (communications with other health care professionals or caregivers).    Thank you for allowing me to participate in the care of this patient.    Barrie Folk, PA-C Department of Hematology/Oncology [facility] at [facility] Phone: [phone]  [phone]    10/24/2022 4:32 PM

## 2022-10-25 ENCOUNTER — Telehealth: Payer: Self-pay

## 2022-10-25 ENCOUNTER — Encounter: Payer: Self-pay | Admitting: Physician Assistant

## 2022-10-25 ENCOUNTER — Inpatient Hospital Stay: Payer: PPO

## 2022-10-25 ENCOUNTER — Inpatient Hospital Stay (HOSPITAL_BASED_OUTPATIENT_CLINIC_OR_DEPARTMENT_OTHER): Payer: PPO | Admitting: Physician Assistant

## 2022-10-25 ENCOUNTER — Other Ambulatory Visit: Payer: Self-pay

## 2022-10-25 VITALS — BP 110/62 | HR 71 | Temp 97.0°F | Resp 14

## 2022-10-25 VITALS — BP 108/66 | HR 68 | Resp 16 | Wt 210.1 lb

## 2022-10-25 DIAGNOSIS — E876 Hypokalemia: Secondary | ICD-10-CM | POA: Diagnosis not present

## 2022-10-25 DIAGNOSIS — R112 Nausea with vomiting, unspecified: Secondary | ICD-10-CM

## 2022-10-25 DIAGNOSIS — R197 Diarrhea, unspecified: Secondary | ICD-10-CM

## 2022-10-25 DIAGNOSIS — Z5111 Encounter for antineoplastic chemotherapy: Secondary | ICD-10-CM | POA: Diagnosis not present

## 2022-10-25 DIAGNOSIS — Z170 Estrogen receptor positive status [ER+]: Secondary | ICD-10-CM

## 2022-10-25 DIAGNOSIS — Z95828 Presence of other vascular implants and grafts: Secondary | ICD-10-CM

## 2022-10-25 DIAGNOSIS — C50411 Malignant neoplasm of upper-outer quadrant of right female breast: Secondary | ICD-10-CM

## 2022-10-25 DIAGNOSIS — C50412 Malignant neoplasm of upper-outer quadrant of left female breast: Secondary | ICD-10-CM

## 2022-10-25 LAB — CMP (CANCER CENTER ONLY)
ALT: 31 U/L (ref 0–44)
AST: 37 U/L (ref 15–41)
Albumin: 3.5 g/dL (ref 3.5–5.0)
Alkaline Phosphatase: 74 U/L (ref 38–126)
Anion gap: 8 (ref 5–15)
BUN: 16 mg/dL (ref 8–23)
CO2: 20 mmol/L — ABNORMAL LOW (ref 22–32)
Calcium: 8.6 mg/dL — ABNORMAL LOW (ref 8.9–10.3)
Chloride: 110 mmol/L (ref 98–111)
Creatinine: 0.81 mg/dL (ref 0.44–1.00)
GFR, Estimated: >60 mL/min (ref >60)
Glucose, Bld: 90 mg/dL (ref 70–99)
Potassium: 3 mmol/L — ABNORMAL LOW (ref 3.5–5.1)
Sodium: 138 mmol/L (ref 135–145)
Total Bilirubin: 0.7 mg/dL (ref 0.3–1.2)
Total Protein: 6.8 g/dL (ref 6.5–8.1)

## 2022-10-25 LAB — CBC WITH DIFFERENTIAL (CANCER CENTER ONLY)
Abs Immature Granulocytes: 0 10*3/uL (ref 0.00–0.07)
Band Neutrophils: 9 %
Basophils Absolute: 0 10*3/uL (ref 0.0–0.1)
Basophils Relative: 0 %
Eosinophils Absolute: 0 10*3/uL (ref 0.0–0.5)
Eosinophils Relative: 0 %
HCT: 32.2 % — ABNORMAL LOW (ref 36.0–46.0)
Hemoglobin: 10.4 g/dL — ABNORMAL LOW (ref 12.0–15.0)
Lymphocytes Relative: 1 %
Lymphs Abs: 0.2 10*3/uL — ABNORMAL LOW (ref 0.7–4.0)
MCH: 28.5 pg (ref 26.0–34.0)
MCHC: 32.3 g/dL (ref 30.0–36.0)
MCV: 88.2 fL (ref 80.0–100.0)
Monocytes Absolute: 0 10*3/uL — ABNORMAL LOW (ref 0.1–1.0)
Monocytes Relative: 0 %
Neutro Abs: 24.5 10*3/uL — ABNORMAL HIGH (ref 1.7–7.7)
Neutrophils Relative %: 90 %
Platelet Count: 264 10*3/uL (ref 150–400)
RBC: 3.65 MIL/uL — ABNORMAL LOW (ref 3.87–5.11)
RDW: 15.9 % — ABNORMAL HIGH (ref 11.5–15.5)
Smear Review: NORMAL
WBC Count: 24.7 10*3/uL — ABNORMAL HIGH (ref 4.0–10.5)
nRBC: 0 % (ref 0.0–0.2)

## 2022-10-25 LAB — MAGNESIUM: Magnesium: 1.4 mg/dL — ABNORMAL LOW (ref 1.7–2.4)

## 2022-10-25 MED ORDER — SODIUM CHLORIDE 0.9 % IV SOLN: Freq: Once | INTRAVENOUS | Status: DC

## 2022-10-25 MED ORDER — SODIUM CHLORIDE 0.9% FLUSH
10.0000 mL | Freq: Once | INTRAVENOUS | Status: AC
  Administered 2022-10-25: 10 mL

## 2022-10-25 MED ORDER — POTASSIUM CHLORIDE 10 MEQ/100ML IV SOLN
10.0000 meq | Freq: Once | INTRAVENOUS | Status: AC
  Administered 2022-10-25: 10 meq via INTRAVENOUS
  Filled 2022-10-25: qty 100

## 2022-10-25 MED ORDER — ONDANSETRON HCL 4 MG/2ML IJ SOLN
4.0000 mg | Freq: Once | INTRAMUSCULAR | Status: AC
  Administered 2022-10-25: 4 mg via INTRAVENOUS
  Filled 2022-10-25: qty 2

## 2022-10-25 MED ORDER — MAGNESIUM SULFATE 50 % IJ SOLN: 1.0000 g | Freq: Once | INTRAVENOUS | Status: DC

## 2022-10-25 MED ORDER — MAGNESIUM SULFATE 2 GM/50ML IV SOLN
2.0000 g | Freq: Once | INTRAVENOUS | Status: AC
  Administered 2022-10-25: 2 g via INTRAVENOUS
  Filled 2022-10-25: qty 50

## 2022-10-25 MED ORDER — SODIUM CHLORIDE 0.9 % IV SOLN: Freq: Once | INTRAVENOUS | Status: AC

## 2022-10-25 MED ORDER — HEPARIN SOD (PORK) LOCK FLUSH 100 UNIT/ML IV SOLN
500.0000 [IU] | Freq: Once | INTRAVENOUS | Status: AC
  Administered 2022-10-25: 500 [IU]

## 2022-10-25 NOTE — Telephone Encounter (Signed)
This RN spoke to patient regarding her mychart messages about continued diarrhea. Appointment made for IV fluids, patient verbalized understanding of time of appointment.

## 2022-10-25 NOTE — Progress Notes (Signed)
Symptom Management Consult note [place]    Patient Care Team: Kathyrn Lass, MD as PCP - General (Family Medicine) Brunetta Genera, MD as PCP - Hematology/Oncology (Hematology) Wonda Horner, MD as Consulting Physician (Gastroenterology) Truitt Merle, MD as Consulting Physician (Hematology) Alla Feeling, NP as Nurse Practitioner (Nurse Practitioner) Kyung Rudd, MD as Consulting Physician (Radiation Oncology) Michael Boston, MD as Consulting Physician (General Surgery) Izora Gala, MD as Attending Physician (Otolaryngology)    Name / MRN / DOB: Cynthia Velasquez  [id number]  10/29/1950   Date of visit: 10/25/2022   Chief Complaint/Reason for visit: diarrhea and nausea   Current Therapy: Final treatment   Last treatment:  Day 2   Cycle 6 on 10/23/22   ASSESSMENT & PLAN: Patient is a 72 y.o. female  with oncologic history of breast cancer (2014), anal cancer (2020), currently treated for T cell lymphoma followed by Dr. Irene Limbo.  I have viewed most recent oncology note and lab work.    #T cell lymphoma -Last day of etopside was held 2/2 diarrhea so patient has completed course of chemo. - Patient will follow up with oncologist for PET scan now that chemo is done.   #Diarrhea and nausea -Seen yesterday for the same. -Patient given liter of IV fluids and Zofran in clinic.  C. difficile testing from yesterday was negative.  GI stool PCR panel still in process and should result later this afternoon. -Labs checked today show still having hypokalemia 3.0 and hypomagnesemia 1.4.  She was given 1 round of IV potassium and 2 g of magnesium while in clinic. -CBC shows WBC 24.7, likely related to Udyenca injection she received yesterday. She has been afebrile at home and is also afebrile in clinic. Abdominal exam is benign. -Once GI stool PCR panel results I will prescribe lomotil if testing is negative. Advised patient to hold imodium until results are  available.   Strict ED precautions discussed should symptoms worsen.   Heme/Onc History: Oncology History Overview Note  Cancer Staging Anal cancer ([place]) Staging form: Anus, AJCC 8th Edition - Clinical stage from 06/28/2019: Stage IIA (cT2, cN0, cM0) - Signed by Alla Feeling, NP on 06/28/2019  Breast cancer of upper-outer quadrant of left female breast [facility]) Staging form: Breast, AJCC 7th Edition - Clinical stage from 09/07/2013: Stage IIA (T2, N0, cM0) - Signed by [name], MD on 09/07/2013 - Pathologic: Stage IIA (T2, N0, cM0) - Signed by [name], MD on 09/07/2013  Breast cancer of upper-outer quadrant of right female breast [facility]) Staging form: Breast, AJCC 7th Edition - Clinical stage from 09/07/2013: Stage IA (T1a, N0, cM0) - Signed by [name], MD on 09/07/2013 - Pathologic: Stage IA (T1a, N0, cM0) - Signed by [name], MD on 09/07/2013    Breast cancer of upper-outer quadrant of left female breast ([place])  07/10/2007 Procedure   US biopsy showed invasive ductal carcinoma 0.8cm, Nottingham grade 3   07/30/2007 Surgery   She had left breast lumpectomy and LN biopsy which showed high grade invasive ductal cancer Nottingham grade 3, 2.7 cm, ER/PR/Her2 neu negative   11/24/2007 Surgery   Patient elected for bilateral mastectomy   09/07/2008 - 03/08/2009 Chemotherapy   dates are approximate: she received adriamycin and cytoxan followed by Taxol   03/08/2009 - 05/08/2009 Radiation Therapy   dates are approximate, she received XRT    07/09/2021 Imaging   CT CAP  IMPRESSION: 1. No evidence to suggest locally recurrent ianorectal  neoplasm or metastatic disease in the chest, abdomen or pelvis. 2. Status post bilateral modified radical mastectomy and bilateral axillary lymph node dissection. 3. Three small supraumbilical ventral hernias containing only omental fat. No associated bowel incarceration or obstruction at this time. 4. Aortic atherosclerosis. 5.  Additional incidental findings, as above.   Breast cancer of upper-outer quadrant of right female breast ([place])  07/10/2006 Procedure   stereotactic biopsy showed atypical hyperplasia   10/23/2006 Surgery   right lumpectomy showed invasive ductal ca (Nottingham grade 1)  30m and DCIS, ER/PR positive Her 2 negative   10/09/2007 - 10/08/2012 Chemotherapy   Patient was placed on Tamoxifen   03/24/2017 Imaging   No acute findings. No evidence of recurrent carcinoma or metastatic disease   07/09/2021 Imaging   CT CAP  IMPRESSION: 1. No evidence to suggest locally recurrent ianorectal neoplasm or metastatic disease in the chest, abdomen or pelvis. 2. Status post bilateral modified radical mastectomy and bilateral axillary lymph node dissection. 3. Three small supraumbilical ventral hernias containing only omental fat. No associated bowel incarceration or obstruction at this time. 4. Aortic atherosclerosis. 5. Additional incidental findings, as above.   [place] cancer ([place]  06/04/2019 Procedure   Colonoscopy per Dr. SAnson Fret Findings-the digital rectal exam revealed a 3 cm diameter firm rectal mass.  The mass was noncircumferential and located predominantly at the right bowel wall at the anorectal junction. A nonobstructing mass was found at the anus and in the rectum    06/04/2019 Initial Biopsy   Follow pathology: Large intestine, rectum biopsy: Invasive well to moderately differentiated squamous cell carcinoma.  No rectal mucosa present.  There is strong diffuse expression of P 16 immunostain.  CDX 2, p63 and mCEA immunostains are also used in the diagnostic work-up of the case.   06/04/2019 Initial Diagnosis   Anal cancer ([place]   06/21/2019 PET scan   IMPRESSION: 1. Anorectal primary. No hypermetabolic metastatic disease within the chest, abdomen, or pelvis. Perirectal nodes, at least 1 of which is new since 02/26/2018 CT, suspicious based on size and interval development. 2. Mild  limitations secondary to beam hardening artifact from bilateral hip arthroplasty. 3.  Aortic Atherosclerosis (ICD10-I70.0).   06/28/2019 Cancer Staging   Staging form: Anus, AJCC 8th Edition - Clinical stage from 06/28/2019: Stage IIA (cT2, cN0, cM0) - Signed by BAlla Feeling NP on 06/28/2019   06/28/2019 - 07/26/2019 Chemotherapy   concurrent chemoRT with Mitomycin and 5FU on week 1 and week 5 on starting 06/28/19. Last dose on 07/26/19   06/28/2019 - 08/09/2019 Radiation Therapy   concurrent chemoRT with Dr MLisbeth Renshaw10/12/20-11/23/20   11/01/2019 PET scan   IMPRESSION: 1. Marked interval decrease in hypermetabolism noted at the level of the anal rectal primary. No evidence for hypermetabolic metastatic disease in the chest, abdomen, or pelvis. The perirectal lymph nodes identified previously have resolved in the interval. 2.  Aortic Atherosclerois (ICD10-170.0)   08/07/2020 Imaging   CT CAP  IMPRESSION: 1. No findings for residual/recurrent anal tumor, regional lymphadenopathy or metastatic disease. 2. Status post cholecystectomy. No biliary dilatation. 3. Small anterior abdominal wall hernia containing fat. 4. Aortic atherosclerosis.   Aortic Atherosclerosis (ICD10-I70.0).     07/09/2021 Imaging   CT CAP  IMPRESSION: 1. No evidence to suggest locally recurrent ianorectal neoplasm or metastatic disease in the chest, abdomen or pelvis. 2. Status post bilateral modified radical mastectomy and bilateral axillary lymph node dissection. 3. Three small supraumbilical ventral hernias containing only omental fat. No associated  bowel incarceration or obstruction at this time. 4. Aortic atherosclerosis. 5. Additional incidental findings, as above.   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 female with oncologic history as above presenting to [facility] today with chief complaint of diarrhea and nausea x 5 days.  She presents unaccompanied to clinic.  Patient seen yesterday for nausea and diarrhea.  She was given IV fluids and antiemetic along with IV potassium.  She states since going home last night she has continued to have symptoms.  She spent most of the overnight on the toilet. She took imodium last night at 11pm and again this morning at 10 am. She also took compazine at 10am this morning. She has not had any vomiting. She has only taken sips of fluids because of nausea. She denies any fever. She still has cramping immediately preceding bowel movements. Patient admits she has had diarrhea during chemotherapy however it never lasted this long. She admits alternating between constipation and diarrhea since completing treatment for anal cancer. Patient admits to feeling tired because she got minimal sleep last night. Denies any blood in stool.      ROS  All other systems are reviewed and are negative for acute change except as noted in the HPI.    Allergies  Allergen Reactions   Tramadol Itching and Rash   Codeine Itching and Rash   Vicodin [Hydrocodone-Acetaminophen] Itching and Rash     Past Medical History:  Diagnosis Date   Anal cancer ([place]) dx'd 05/2019   Anxiety    Arthritis    Bilateral breast cancer ([place]) 10/21/2013   Breast cancer ([place])    Cancer ([place])    Depression    Gallstones    GERD (gastroesophageal reflux disease)    doesn't take any meds for this   Headache    h/o migraines       History of bladder infections    History of hiatal hernia    History of migraine    Hyperlipidemia    takes Simvastatin daily   Hypertension    takes Hyzaar   Joint pain    Joint swelling    Lumbar stenosis    Neuropathy 09/07/2013   Pneumonia    Pre-diabetes      Past  Surgical History:  Procedure Laterality Date   ABDOMINAL HYSTERECTOMY     bone spur removed from left foot     CHOLECYSTECTOMY     COLONOSCOPY     COLONOSCOPY WITH PROPOFOL Bilateral 12/07/2021   Procedure: COLONOSCOPY WITH PROPOFOL;  Surgeon: Clarene Essex, MD;  Location: [facility];  Service: Endoscopy;  Laterality: Bilateral;   double mastectomy      ESOPHAGOGASTRODUODENOSCOPY N/A 12/07/2021   Procedure: ESOPHAGOGASTRODUODENOSCOPY (EGD);  Surgeon: Clarene Essex, MD;  Location: [facility];  Service: Endoscopy;  Laterality: N/A;   HERNIA REPAIR     umbilical   IR IMAGING GUIDED PORT INSERTION  06/25/2022   right knee arthroscopy     right shoulder arthroscopy     surgery for hiatal hernia     TONSILLECTOMY     TOTAL HIP ARTHROPLASTY Left 02/12/2013   TOTAL HIP ARTHROPLASTY Left 02/12/2013   Procedure: LEFT TOTAL HIP  ARTHROPLASTY;  Surgeon: Kerin Salen, MD;  Location: [place];  Service: Orthopedics;  Laterality: Left;   TOTAL HIP ARTHROPLASTY Right 05/03/2013   Procedure: TOTAL HIP ARTHROPLASTY;  Surgeon: Kerin Salen, MD;  Location: [place];  Service: Orthopedics;  Laterality: Right;   TOTAL KNEE ARTHROPLASTY Right 02/27/2015   Procedure: TOTAL KNEE ARTHROPLASTY;  Surgeon: Frederik Pear, MD;  Location: [place];  Service: Orthopedics;  Laterality: Right;   TOTAL SHOULDER ARTHROPLASTY Right 09/12/2016   Procedure: RIGHT TOTAL SHOULDER ARTHROPLASTY;  Surgeon: Tania Ade, MD;  Location: [place];  Service: Orthopedics;  Laterality: Right;  RIGHT TOTAL SHOULDER ARTHROPLASTY    Social History   Socioeconomic History   Marital status: Single    Spouse name: Not on file   Number of children: 1   Years of education: Not on file   Highest education level: Not on file  Occupational History   Not on file  Tobacco Use   Smoking status: Never   Smokeless tobacco: Never  Vaping Use   Vaping Use: Never used  Substance and Sexual Activity   Alcohol use: No    Comment: occ   Drug use: No    Sexual activity: Not Currently  Other Topics Concern   Not on file  Social History Narrative   Are you right handed or left handed? Right   Are you currently employed ? no   What is your current occupation?retired   Do you live at home alone? no   Who lives with you? Daughter and granddaughter   What type of home do you live in: 1 story or 2 story? one   Caffeine 1 cup a day   Social Determinants of Health   Financial Resource Strain: Not on file  Food Insecurity: Not on file  Transportation Needs: No Transportation Needs (06/17/2019)   PRAPARE - Hydrologist (Medical): No    Lack of Transportation (Non-Medical): No  Physical Activity: Not on file  Stress: Not on file  Social Connections: Not on file  Intimate Partner Violence: Not on file    Family History  Adopted: Yes  Problem Relation Age of Onset   Allergic rhinitis Neg Hx    Angioedema Neg Hx    Atopy Neg Hx    Eczema Neg Hx    Immunodeficiency Neg Hx    Urticaria Neg Hx    Asthma Neg Hx      Current Outpatient Medications:    acyclovir (ZOVIRAX) 400 MG tablet, Take 1 tablet (400 mg total) by mouth daily., Disp: 30 tablet, Rfl: 3   aspirin-acetaminophen-caffeine (EXCEDRIN MIGRAINE) 250-250-65 MG tablet, Take 2 tablets by mouth every 6 (six) hours as needed for headache., Disp: , Rfl:    baclofen (LIORESAL) 10 MG tablet, Take 1 tablet (10 mg total) by mouth 3 (three) times daily as needed for muscle spasms., Disp: 30 each, Rfl: 0   DULoxetine (CYMBALTA) 30 MG capsule, Take 2 capsules (60 mg total) by mouth at bedtime. Take 1 capsule (30 mg) at bedtime for 1 week, if no side effects, then increase to 2 capsules (60 mg) thereafter., Disp: 60 capsule, Rfl: 5   famotidine (PEPCID) 20 MG tablet, Take 1 tablet (20 mg total) by mouth 2 (two) times daily., Disp: 30 tablet, Rfl: 0   HYDROcodone-acetaminophen (NORCO) 5-325 MG tablet, Take 1 tablet by mouth every 6 (six) hours as needed for moderate  pain. (Patient not taking: Reported on 10/11/2022), Disp: 30 tablet, Rfl:  0   lidocaine-prilocaine (EMLA) cream, Apply to affected area once (Patient not taking: Reported on 08/20/2022), Disp: 30 g, Rfl: 3   loratadine (CLARITIN) 10 MG tablet, TAKE 1 TABLET(10 MG) BY MOUTH DAILY, Disp: 30 tablet, Rfl: 0   losartan (COZAAR) 100 MG tablet, Take 100 mg by mouth daily., Disp: , Rfl: 6   Menthol-Methyl Salicylate (SALONPAS PAIN RELIEF PATCH EX), Apply 1 patch topically daily as needed (pain)., Disp: , Rfl:    nitrofurantoin, macrocrystal-monohydrate, (MACROBID) 100 MG capsule, Take 1 capsule (100 mg total) by mouth 2 (two) times daily for 5 days., Disp: 10 capsule, Rfl: 0   nystatin (MYCOSTATIN/NYSTOP) powder, Apply 1 Application topically 3 (three) times daily., Disp: 15 g, Rfl: 0   ondansetron (ZOFRAN) 8 MG tablet, Take 1 tablet (8 mg total) by mouth every 8 (eight) hours as needed for nausea or vomiting. Start on the third day after cyclophosphamide chemotherapy., Disp: 30 tablet, Rfl: 1   potassium chloride SA (KLOR-CON M) 20 MEQ tablet, Take 1 tablet (20 mEq total) by mouth 2 (two) times daily for 7 days., Disp: 14 tablet, Rfl: 0   predniSONE (DELTASONE) 20 MG tablet, Take 3 tablets (60 mg total) by mouth daily. Take for 2 days starting the day after chemotherapy on day 4, Disp: 30 tablet, Rfl: 0   prochlorperazine (COMPAZINE) 10 MG tablet, Take 1 tablet (10 mg total) by mouth every 6 (six) hours as needed for nausea or vomiting., Disp: 30 tablet, Rfl: 6   Saccharomyces boulardii 500 MG PACK, Take 500 mg by mouth 2 (two) times daily with a meal., Disp: 60 each, Rfl: 0   senna-docusate (SENNA S) 8.6-50 MG tablet, Take 2 tablets by mouth at bedtime., Disp: 60 tablet, Rfl: 1  PHYSICAL EXAM: ECOG FS:1 - Symptomatic but completely ambulatory    Vitals:   10/25/22 1127  BP: 110/62  Pulse: 71  Resp: 14  Temp: (!) 97 F (36.1 C)  TempSrc: Oral  SpO2: 100%   Physical Exam Vitals and nursing note  reviewed.  Constitutional:      Appearance: She is well-developed. She is not ill-appearing or toxic-appearing.  HENT:     Head: Normocephalic.     Nose: Nose normal.     Mouth/Throat:     Mouth: Mucous membranes are dry.  Eyes:     Conjunctiva/sclera: Conjunctivae normal.  Neck:     Vascular: No JVD.  Cardiovascular:     Rate and Rhythm: Normal rate.  Pulmonary:     Effort: Pulmonary effort is normal.  Abdominal:     General: There is no distension.     Palpations: Abdomen is soft. There is no mass.     Tenderness: There is no abdominal tenderness. There is no guarding or rebound.     Hernia: No hernia is present.  Musculoskeletal:     Cervical back: Normal range of motion.  Skin:    General: Skin is warm and dry.  Neurological:     Mental Status: She is oriented to person, place, and time.        LABORATORY DATA: I have reviewed the data as listed    Latest Ref Rng & Units 10/25/2022   11:20 AM 10/24/2022    1:45 PM 10/22/2022   10:35 AM  CBC  WBC 4.0 - 10.5 K/uL 24.7  3.8  5.0   Hemoglobin 12.0 - 15.0 g/dL 10.4  9.3  10.8   Hematocrit 36.0 - 46.0 % 32.2  28.3  33.1  Platelets 150 - 400 K/uL 264  260  281         Latest Ref Rng & Units 10/25/2022   11:20 AM 10/24/2022    1:45 PM 10/22/2022   10:35 AM  CMP  Glucose 70 - 99 mg/dL 90  72  99   BUN 8 - 23 mg/dL 16  20  18   $ Creatinine 0.44 - 1.00 mg/dL 0.81  0.76  0.89   Sodium 135 - 145 mmol/L 138  139  141   Potassium 3.5 - 5.1 mmol/L 3.0  3.1  3.4   Chloride 98 - 111 mmol/L 110  109  109   CO2 22 - 32 mmol/L 20  22  24   $ Calcium 8.9 - 10.3 mg/dL 8.6  8.4  9.5   Total Protein 6.5 - 8.1 g/dL 6.8  6.1  7.2   Total Bilirubin 0.3 - 1.2 mg/dL 0.7  0.5  0.3   Alkaline Phos 38 - 126 U/L 74  64  84   AST 15 - 41 U/L 37  18  17   ALT 0 - 44 U/L 31  10  11        $ RADIOGRAPHIC STUDIES (from last 24 hours if applicable) I have personally reviewed the radiological images as listed and agreed with the findings in the  report. No results found.      Visit Diagnosis: 1. Diarrhea of presumed infectious origin   2. Diarrhea, unspecified type   3. Hypokalemia   4. Hypomagnesemia      No orders of the defined types were placed in this encounter.   All questions were answered. The patient knows to call the clinic with any problems, questions or concerns. No barriers to learning was detected.  I have spent a total of 20 minutes minutes of face-to-face and non-face-to-face time, preparing to see the patient, obtaining and/or reviewing separately obtained history, performing a medically appropriate examination, counseling and educating the patient, ordering tests, documenting clinical information in the electronic health record.    Thank you for allowing me to participate in the care of this patient.    Barrie Folk, PA-C Department of Hematology/Oncology [facility] Phone: [phone]  [phone]    10/25/2022 1:38 PM

## 2022-10-25 NOTE — Patient Instructions (Signed)

## 2022-10-25 NOTE — Progress Notes (Signed)
Fluid orders entered for [facility] encounter.

## 2022-10-26 ENCOUNTER — Inpatient Hospital Stay: Payer: PPO

## 2022-10-26 LAB — URINE CULTURE: Culture: 60000 — AB

## 2022-10-27 ENCOUNTER — Encounter: Payer: Self-pay | Admitting: Physician Assistant

## 2022-10-28 ENCOUNTER — Inpatient Hospital Stay (HOSPITAL_COMMUNITY)
Admission: EM | Admit: 2022-10-28 | Discharge: 2022-11-05 | DRG: 391 | Disposition: A | Discharge status: Home / Self Care | Payer: PPO | Source: Ambulatory Visit | Attending: Internal Medicine | Admitting: Internal Medicine

## 2022-10-28 ENCOUNTER — Encounter: Payer: Self-pay | Admitting: Hematology

## 2022-10-28 ENCOUNTER — Telehealth: Payer: Self-pay | Admitting: Physician Assistant

## 2022-10-28 ENCOUNTER — Encounter (HOSPITAL_COMMUNITY): Payer: Self-pay

## 2022-10-28 ENCOUNTER — Encounter: Payer: Self-pay | Admitting: Physician Assistant

## 2022-10-28 ENCOUNTER — Other Ambulatory Visit: Payer: Self-pay

## 2022-10-28 DIAGNOSIS — D649 Anemia, unspecified: Secondary | ICD-10-CM | POA: Diagnosis present

## 2022-10-28 DIAGNOSIS — B9689 Other specified bacterial agents as the cause of diseases classified elsewhere: Secondary | ICD-10-CM | POA: Diagnosis not present

## 2022-10-28 DIAGNOSIS — E860 Dehydration: Secondary | ICD-10-CM | POA: Diagnosis present

## 2022-10-28 DIAGNOSIS — B961 Klebsiella pneumoniae [K. pneumoniae] as the cause of diseases classified elsewhere: Secondary | ICD-10-CM | POA: Diagnosis present

## 2022-10-28 DIAGNOSIS — D849 Immunodeficiency, unspecified: Secondary | ICD-10-CM | POA: Diagnosis present

## 2022-10-28 DIAGNOSIS — E669 Obesity, unspecified: Secondary | ICD-10-CM | POA: Diagnosis present

## 2022-10-28 DIAGNOSIS — C8448 Peripheral T-cell lymphoma, not classified, lymph nodes of multiple sites: Secondary | ICD-10-CM | POA: Diagnosis not present

## 2022-10-28 DIAGNOSIS — Z90710 Acquired absence of both cervix and uterus: Secondary | ICD-10-CM

## 2022-10-28 DIAGNOSIS — Z923 Personal history of irradiation: Secondary | ICD-10-CM

## 2022-10-28 DIAGNOSIS — Z6834 Body mass index (BMI) 34.0-34.9, adult: Secondary | ICD-10-CM

## 2022-10-28 DIAGNOSIS — Z888 Allergy status to other drugs, medicaments and biological substances status: Secondary | ICD-10-CM

## 2022-10-28 DIAGNOSIS — F32A Depression, unspecified: Secondary | ICD-10-CM | POA: Diagnosis not present

## 2022-10-28 DIAGNOSIS — D61818 Other pancytopenia: Secondary | ICD-10-CM | POA: Diagnosis present

## 2022-10-28 DIAGNOSIS — R7303 Prediabetes: Secondary | ICD-10-CM | POA: Diagnosis present

## 2022-10-28 DIAGNOSIS — B3731 Acute candidiasis of vulva and vagina: Secondary | ICD-10-CM | POA: Diagnosis not present

## 2022-10-28 DIAGNOSIS — N390 Urinary tract infection, site not specified: Secondary | ICD-10-CM | POA: Diagnosis present

## 2022-10-28 DIAGNOSIS — D61810 Antineoplastic chemotherapy induced pancytopenia: Secondary | ICD-10-CM | POA: Diagnosis present

## 2022-10-28 DIAGNOSIS — M48061 Spinal stenosis, lumbar region without neurogenic claudication: Secondary | ICD-10-CM | POA: Diagnosis present

## 2022-10-28 DIAGNOSIS — Z79899 Other long term (current) drug therapy: Secondary | ICD-10-CM

## 2022-10-28 DIAGNOSIS — A0839 Other viral enteritis: Secondary | ICD-10-CM | POA: Diagnosis not present

## 2022-10-28 DIAGNOSIS — Z96611 Presence of right artificial shoulder joint: Secondary | ICD-10-CM | POA: Diagnosis present

## 2022-10-28 DIAGNOSIS — Z9013 Acquired absence of bilateral breasts and nipples: Secondary | ICD-10-CM

## 2022-10-28 DIAGNOSIS — E7800 Pure hypercholesterolemia, unspecified: Secondary | ICD-10-CM | POA: Diagnosis not present

## 2022-10-28 DIAGNOSIS — Z8701 Personal history of pneumonia (recurrent): Secondary | ICD-10-CM

## 2022-10-28 DIAGNOSIS — R197 Diarrhea, unspecified: Secondary | ICD-10-CM | POA: Diagnosis present

## 2022-10-28 DIAGNOSIS — I10 Essential (primary) hypertension: Secondary | ICD-10-CM | POA: Diagnosis not present

## 2022-10-28 DIAGNOSIS — K6289 Other specified diseases of anus and rectum: Secondary | ICD-10-CM | POA: Diagnosis not present

## 2022-10-28 DIAGNOSIS — K219 Gastro-esophageal reflux disease without esophagitis: Secondary | ICD-10-CM | POA: Diagnosis not present

## 2022-10-28 DIAGNOSIS — K449 Diaphragmatic hernia without obstruction or gangrene: Secondary | ICD-10-CM | POA: Diagnosis not present

## 2022-10-28 DIAGNOSIS — T451X5A Adverse effect of antineoplastic and immunosuppressive drugs, initial encounter: Secondary | ICD-10-CM | POA: Diagnosis present

## 2022-10-28 DIAGNOSIS — E872 Acidosis, unspecified: Secondary | ICD-10-CM | POA: Diagnosis not present

## 2022-10-28 DIAGNOSIS — R079 Chest pain, unspecified: Secondary | ICD-10-CM | POA: Diagnosis not present

## 2022-10-28 DIAGNOSIS — E876 Hypokalemia: Principal | ICD-10-CM | POA: Diagnosis present

## 2022-10-28 DIAGNOSIS — F419 Anxiety disorder, unspecified: Secondary | ICD-10-CM | POA: Diagnosis present

## 2022-10-28 DIAGNOSIS — R9431 Abnormal electrocardiogram [ECG] [EKG]: Secondary | ICD-10-CM | POA: Diagnosis not present

## 2022-10-28 DIAGNOSIS — A09 Infectious gastroenteritis and colitis, unspecified: Secondary | ICD-10-CM | POA: Diagnosis present

## 2022-10-28 DIAGNOSIS — Z87440 Personal history of urinary (tract) infections: Secondary | ICD-10-CM

## 2022-10-28 DIAGNOSIS — B9789 Other viral agents as the cause of diseases classified elsewhere: Secondary | ICD-10-CM | POA: Diagnosis present

## 2022-10-28 DIAGNOSIS — Z96643 Presence of artificial hip joint, bilateral: Secondary | ICD-10-CM | POA: Diagnosis present

## 2022-10-28 DIAGNOSIS — Z853 Personal history of malignant neoplasm of breast: Secondary | ICD-10-CM

## 2022-10-28 DIAGNOSIS — Z885 Allergy status to narcotic agent status: Secondary | ICD-10-CM

## 2022-10-28 DIAGNOSIS — R739 Hyperglycemia, unspecified: Secondary | ICD-10-CM | POA: Diagnosis not present

## 2022-10-28 LAB — COMPREHENSIVE METABOLIC PANEL
ALT: 142 U/L — ABNORMAL HIGH (ref 0–44)
AST: 56 U/L — ABNORMAL HIGH (ref 15–41)
Albumin: 3.4 g/dL — ABNORMAL LOW (ref 3.5–5.0)
Alkaline Phosphatase: 93 U/L (ref 38–126)
Anion gap: 9 (ref 5–15)
BUN: 13 mg/dL (ref 8–23)
CO2: 14 mmol/L — ABNORMAL LOW (ref 22–32)
Calcium: 8.7 mg/dL — ABNORMAL LOW (ref 8.9–10.3)
Chloride: 117 mmol/L — ABNORMAL HIGH (ref 98–111)
Creatinine, Ser: 1.13 mg/dL — ABNORMAL HIGH (ref 0.44–1.00)
GFR, Estimated: 52 mL/min — ABNORMAL LOW (ref >60)
Glucose, Bld: 120 mg/dL — ABNORMAL HIGH (ref 70–99)
Potassium: 2.5 mmol/L — CL (ref 3.5–5.1)
Sodium: 140 mmol/L (ref 135–145)
Total Bilirubin: 0.6 mg/dL (ref 0.3–1.2)
Total Protein: 7 g/dL (ref 6.5–8.1)

## 2022-10-28 LAB — GI PATHOGEN PANEL BY PCR, STOOL
Adenovirus F 40/41: NOT DETECTED
Astrovirus: NOT DETECTED
Campylobacter by PCR: NOT DETECTED
Cryptosporidium by PCR: NOT DETECTED
Cyclospora cayetanensis: NOT DETECTED
E coli (ETEC) LT/ST: NOT DETECTED
E coli (STEC): NOT DETECTED
Entamoeba histolytica: NOT DETECTED
Enteroaggregative E coli: NOT DETECTED
Enteropathogenic E coli: NOT DETECTED
G lamblia by PCR: NOT DETECTED
Norovirus GI/GII: NOT DETECTED
Plesiomonas shigelloides: NOT DETECTED
Rotavirus A by PCR: NOT DETECTED
Salmonella by PCR: NOT DETECTED
Sapovirus: DETECTED — AB
Shigella by PCR: NOT DETECTED
Vibrio cholerae: NOT DETECTED
Vibrio: NOT DETECTED
Yersinia enterocolitica: NOT DETECTED

## 2022-10-28 LAB — LIPASE, BLOOD: Lipase: 29 U/L (ref 11–51)

## 2022-10-28 LAB — CBC
HCT: 34.4 % — ABNORMAL LOW (ref 36.0–46.0)
Hemoglobin: 10.5 g/dL — ABNORMAL LOW (ref 12.0–15.0)
MCH: 28.1 pg (ref 26.0–34.0)
MCHC: 30.5 g/dL (ref 30.0–36.0)
MCV: 92 fL (ref 80.0–100.0)
Platelets: 149 10*3/uL — ABNORMAL LOW (ref 150–400)
RBC: 3.74 MIL/uL — ABNORMAL LOW (ref 3.87–5.11)
RDW: 15.6 % — ABNORMAL HIGH (ref 11.5–15.5)
WBC: 4.7 10*3/uL (ref 4.0–10.5)
nRBC: 0 % (ref 0.0–0.2)

## 2022-10-28 LAB — MAGNESIUM: Magnesium: 1.8 mg/dL (ref 1.7–2.4)

## 2022-10-28 LAB — PHOSPHORUS: Phosphorus: 2 mg/dL — ABNORMAL LOW (ref 2.5–4.6)

## 2022-10-28 MED ORDER — POTASSIUM CHLORIDE CRYS ER 20 MEQ PO TBCR
40.0000 meq | EXTENDED_RELEASE_TABLET | Freq: Once | ORAL | Status: AC
  Administered 2022-10-28: 40 meq via ORAL
  Filled 2022-10-28: qty 2

## 2022-10-28 MED ORDER — ACETAMINOPHEN 650 MG RE SUPP: 650.0000 mg | Freq: Four times a day (QID) | RECTAL | Status: DC | PRN

## 2022-10-28 MED ORDER — PANTOPRAZOLE SODIUM 40 MG IV SOLR
40.0000 mg | Freq: Once | INTRAVENOUS | Status: AC
  Administered 2022-10-28: 40 mg via INTRAVENOUS
  Filled 2022-10-28: qty 10

## 2022-10-28 MED ORDER — POTASSIUM CHLORIDE 10 MEQ/100ML IV SOLN
10.0000 meq | INTRAVENOUS | Status: AC
  Administered 2022-10-28 (×4): 10 meq via INTRAVENOUS
  Filled 2022-10-28 (×4): qty 100

## 2022-10-28 MED ORDER — LOSARTAN POTASSIUM 50 MG PO TABS
100.0000 mg | ORAL_TABLET | Freq: Every day | ORAL | Status: DC
  Administered 2022-10-28 – 2022-11-05 (×9): 100 mg via ORAL
  Filled 2022-10-28 (×9): qty 2

## 2022-10-28 MED ORDER — FAMOTIDINE 20 MG PO TABS: 20.0000 mg | ORAL_TABLET | Freq: Two times a day (BID) | ORAL | Status: DC

## 2022-10-28 MED ORDER — PROCHLORPERAZINE EDISYLATE 10 MG/2ML IJ SOLN: 10.0000 mg | Freq: Four times a day (QID) | INTRAMUSCULAR | Status: DC | PRN

## 2022-10-28 MED ORDER — ACETAMINOPHEN 325 MG PO TABS
650.0000 mg | ORAL_TABLET | Freq: Four times a day (QID) | ORAL | Status: DC | PRN
  Administered 2022-10-29 – 2022-11-04 (×4): 650 mg via ORAL
  Filled 2022-10-28 (×4): qty 2

## 2022-10-28 MED ORDER — SODIUM CHLORIDE 0.9 % IV BOLUS
1000.0000 mL | Freq: Once | INTRAVENOUS | Status: AC
  Administered 2022-10-28: 1000 mL via INTRAVENOUS

## 2022-10-28 MED ORDER — POTASSIUM CHLORIDE IN NACL 20-0.9 MEQ/L-% IV SOLN
INTRAVENOUS | Status: AC
  Filled 2022-10-28 (×4): qty 1000

## 2022-10-28 MED ORDER — HYDROCODONE-ACETAMINOPHEN 5-325 MG PO TABS: 1.0000 | ORAL_TABLET | Freq: Four times a day (QID) | ORAL | Status: DC | PRN

## 2022-10-28 MED ORDER — DULOXETINE HCL 30 MG PO CPEP: 60.0000 mg | ORAL_CAPSULE | Freq: Every day | ORAL | Status: DC

## 2022-10-28 MED ORDER — SACCHAROMYCES BOULARDII 250 MG PO CAPS
250.0000 mg | ORAL_CAPSULE | Freq: Two times a day (BID) | ORAL | Status: DC
  Administered 2022-10-29 – 2022-10-30 (×4): 250 mg via ORAL
  Filled 2022-10-28 (×4): qty 1

## 2022-10-28 MED ORDER — ACYCLOVIR 400 MG PO TABS
400.0000 mg | ORAL_TABLET | Freq: Every day | ORAL | Status: DC
  Administered 2022-10-28 – 2022-10-31 (×4): 400 mg via ORAL
  Filled 2022-10-28 (×4): qty 1

## 2022-10-28 MED ORDER — BACLOFEN 10 MG PO TABS: 10.0000 mg | ORAL_TABLET | Freq: Three times a day (TID) | ORAL | Status: DC | PRN

## 2022-10-28 NOTE — ED Notes (Signed)
Pt had BM, pt cleaned up and brief changed.

## 2022-10-28 NOTE — ED Notes (Signed)
Pt said she still does not need to use the bathroom but the pt is getting fluid

## 2022-10-28 NOTE — H&P (Signed)
History and Physical    [name] [id number] DOB: 03-10-1951 DOA: 10/28/2022 DOS: the patient was seen and examined on 10/28/2022 PCP: Kathyrn Lass, MD  Patient coming from: Home  Chief Complaint:  Chief Complaint  Patient presents with   Diarrhea   HPI: Cynthia Velasquez is a 72 y.o. female with medical history significant of anal cancer, anxiety, depression, osteoarthritis, breast cancer, gallstones, GERD, hiatal hernia, migraine headaches, history of UTIs, hyperlipidemia, hypertension, lumbar stenosis, peripheral neuropathy, history of pneumonia, prediabetes who was sent from the cancer center due to diarrhea for the past 3 weeks associated with colicky epigastric pain and nausea. No constipation, melena or hematochezia.  No flank pain, dysuria, frequency or hematuria.  Positive chills, but no fever or night sweats. No sore throat, rhinorrhea, dyspnea, wheezing or hemoptysis. No chest pain, palpitations, diaphoresis, PND, orthopnea or pitting edema of the lower extremities.  No polyuria, polydipsia, polyphagia or blurred vision.  Lab work: CBC showed a white count of 4.7, hemoglobin 10.5 g deciliter platelets 149.  Lipase is normal.  CMP showed a sodium 140, potassium 3.5, chloride 117 and CO2 14 mmol/L.  BUN 13, creatinine 1.13 and calcium 8.7 mg/dL.  Total protein 7.3 during albumin 3.4 g/dL.  AST 56 and ALT 142 units/L.  Normal alkaline phosphatase and total bilirubin.  ED course: Initial vital signs were temperature 97.7 F, pulse 72, respiration 14, BP 109/87 mmHg O2 sat 100% on room air.  The patient received 1000 mL of normal saline bolus, KCl 40 mEq p.o. x 1 and KCl 10 mEq IVPB x 4.   Review of Systems: As mentioned in the history of present illness. All other systems reviewed and are negative. Past Medical History:  Diagnosis Date   Anal cancer ([place]) dx'd 05/2019   Anxiety    Arthritis    Bilateral breast cancer ([place]) 10/21/2013   Breast cancer ([place])    Cancer ([place])     Depression    Gallstones    GERD (gastroesophageal reflux disease)    doesn't take any meds for this   Headache    h/o migraines       History of bladder infections    History of hiatal hernia    History of migraine    Hyperlipidemia    takes Simvastatin daily   Hypertension    takes Hyzaar   Joint pain    Joint swelling    Lumbar stenosis    Neuropathy 09/07/2013   Pneumonia    Pre-diabetes    Past Surgical History:  Procedure Laterality Date   ABDOMINAL HYSTERECTOMY     bone spur removed from left foot     CHOLECYSTECTOMY     COLONOSCOPY     COLONOSCOPY WITH PROPOFOL Bilateral 12/07/2021   Procedure: COLONOSCOPY WITH PROPOFOL;  Surgeon: Clarene Essex, MD;  Location: [facility];  Service: Endoscopy;  Laterality: Bilateral;   double mastectomy      ESOPHAGOGASTRODUODENOSCOPY N/A 12/07/2021   Procedure: ESOPHAGOGASTRODUODENOSCOPY (EGD);  Surgeon: Clarene Essex, MD;  Location: [facility];  Service: Endoscopy;  Laterality: N/A;   HERNIA REPAIR     umbilical   IR IMAGING GUIDED PORT INSERTION  06/25/2022   right knee arthroscopy     right shoulder arthroscopy     surgery for hiatal hernia     TONSILLECTOMY     TOTAL HIP ARTHROPLASTY Left 02/12/2013   TOTAL HIP ARTHROPLASTY Left 02/12/2013   Procedure: LEFT TOTAL HIP ARTHROPLASTY;  Surgeon: Kerin Salen, MD;  [facility]  OR;  Service: Orthopedics;  Laterality: Left;   TOTAL HIP ARTHROPLASTY Right 05/03/2013   Procedure: TOTAL HIP ARTHROPLASTY;  Surgeon: Kerin Salen, MD;  Location: [place];  Service: Orthopedics;  Laterality: Right;   TOTAL KNEE ARTHROPLASTY Right 02/27/2015   Procedure: TOTAL KNEE ARTHROPLASTY;  Surgeon: Frederik Pear, MD;  Location: [place];  Service: Orthopedics;  Laterality: Right;   TOTAL SHOULDER ARTHROPLASTY Right 09/12/2016   Procedure: RIGHT TOTAL SHOULDER ARTHROPLASTY;  Surgeon: Tania Ade, MD;  Location: [place];  Service: Orthopedics;  Laterality: Right;  RIGHT TOTAL SHOULDER ARTHROPLASTY   Social  History:  reports that she has never smoked. She has never used smokeless tobacco. She reports that she does not drink alcohol and does not use drugs.  Allergies  Allergen Reactions   Tramadol Itching and Rash   Codeine Itching and Rash   Vicodin [Hydrocodone-Acetaminophen] Itching and Rash    Family History  Adopted: Yes  Problem Relation Age of Onset   Allergic rhinitis Neg Hx    Angioedema Neg Hx    Atopy Neg Hx    Eczema Neg Hx    Immunodeficiency Neg Hx    Urticaria Neg Hx    Asthma Neg Hx     Prior to Admission medications   Medication Sig Start Date End Date Taking? Authorizing Provider  acetaminophen (TYLENOL) 500 MG tablet Take 1,000 mg by mouth every 6 (six) hours as needed for mild pain.   Yes [provider]  acyclovir (ZOVIRAX) 400 MG tablet Take 1 tablet (400 mg total) by mouth daily. 06/27/22  Yes Brunetta Genera, MD  aspirin-acetaminophen-caffeine [facility] MIGRAINE) [phone] MG tablet Take 2 tablets by mouth every 6 (six) hours as needed for headache.   Yes [provider]  baclofen (LIORESAL) 10 MG tablet Take 1 tablet (10 mg total) by mouth 3 (three) times daily as needed for muscle spasms. 09/23/22  Yes Vaslow, Acey Lav, MD  loratadine (CLARITIN) 10 MG tablet TAKE 1 TABLET(10 MG) BY MOUTH DAILY Patient taking differently: Take 10 mg by mouth daily as needed for allergies. 08/26/22  Yes Brunetta Genera, MD  losartan (COZAAR) 100 MG tablet Take 100 mg by mouth daily. 01/14/17  Yes [provider]  Menthol-Methyl Salicylate (SALONPAS PAIN RELIEF PATCH EX) Apply 1 patch topically daily as needed (pain).   Yes [provider]  nitrofurantoin, macrocrystal-monohydrate, (MACROBID) 100 MG capsule Take 1 capsule (100 mg total) by mouth 2 (two) times daily for 5 days. 10/24/22 10/29/22 Yes Walisiewicz, Kaitlyn E, PA-C  nystatin (MYCOSTATIN/NYSTOP) powder Apply 1 Application topically 3 (three) times daily. Patient taking  differently: Apply 1 Application topically 3 (three) times daily as needed (redness, rash). 08/30/22  Yes Walisiewicz, Kaitlyn E, PA-C  ondansetron (ZOFRAN) 8 MG tablet Take 1 tablet (8 mg total) by mouth every 8 (eight) hours as needed for nausea or vomiting. Start on the third day after cyclophosphamide chemotherapy. 06/27/22  Yes Brunetta Genera, MD  potassium chloride SA (KLOR-CON M) 20 MEQ tablet Take 1 tablet (20 mEq total) by mouth 2 (two) times daily for 7 days. 10/24/22 10/31/22 Yes Walisiewicz, Kaitlyn E, PA-C  prochlorperazine (COMPAZINE) 10 MG tablet Take 1 tablet (10 mg total) by mouth every 6 (six) hours as needed for nausea or vomiting. 06/27/22  Yes Brunetta Genera, MD  Saccharomyces boulardii 500 MG PACK Take 500 mg by mouth 2 (two) times daily with a meal. 10/22/22  Yes Brunetta Genera, MD  senna-docusate (SENNA S) 8.6-50  MG tablet Take 2 tablets by mouth at bedtime. Patient taking differently: Take 2 tablets by mouth at bedtime as needed for mild constipation. 07/08/22  Yes Brunetta Genera, MD  DULoxetine (CYMBALTA) 30 MG capsule Take 2 capsules (60 mg total) by mouth at bedtime. Take 1 capsule (30 mg) at bedtime for 1 week, if no side effects, then increase to 2 capsules (60 mg) thereafter. Patient not taking: Reported on 10/28/2022 10/11/22   Shellia Carwin, MD  famotidine (PEPCID) 20 MG tablet Take 1 tablet (20 mg total) by mouth 2 (two) times daily. Patient not taking: Reported on 10/28/2022 08/07/22   Godfrey Pick, MD  HYDROcodone-acetaminophen [facility]) 5-325 MG tablet Take 1 tablet by mouth every 6 (six) hours as needed for moderate pain. Patient not taking: Reported on 10/11/2022 06/27/22   Brunetta Genera, MD  lidocaine-prilocaine (EMLA) cream Apply to affected area once Patient not taking: Reported on 08/20/2022 07/08/22   Brunetta Genera, MD  predniSONE (DELTASONE) 20 MG tablet Take 3 tablets (60 mg total) by mouth daily. Take for 2 days starting the day  after chemotherapy on day 4 Patient not taking: Reported on 10/28/2022 08/26/22   Brunetta Genera, MD    Physical Exam: Vitals:   10/28/22 1330 10/28/22 1400 10/28/22 1500 10/28/22 1530  BP: (!) 123/108 117/77 127/83 (!) 144/112  Pulse: 88 85 69 71  Resp: 19 14 18 18  $ Temp:      TempSrc:      SpO2: 98% 99% 99% 100%  Weight:      Height:       Physical Exam Vitals reviewed.  Constitutional:      General: She is awake. She is not in acute distress.    Appearance: She is obese. She is ill-appearing.  HENT:     Head: Normocephalic.     Nose: No rhinorrhea.     Mouth/Throat:     Mouth: Mucous membranes are dry.  Eyes:     General: No scleral icterus.    Pupils: Pupils are equal, round, and reactive to light.  Neck:     Vascular: No JVD.  Cardiovascular:     Rate and Rhythm: Normal rate and regular rhythm.     Heart sounds: S1 normal and S2 normal.  Pulmonary:     Effort: Pulmonary effort is normal.     Breath sounds: No wheezing, rhonchi or rales.  Abdominal:     General: Bowel sounds are normal. There is no distension.     Palpations: Abdomen is soft.     Tenderness: There is abdominal tenderness in the epigastric area. There is no right CVA tenderness, left CVA tenderness, guarding or rebound.  Musculoskeletal:     Cervical back: Neck supple.     Right lower leg: No edema.     Left lower leg: No edema.  Skin:    General: Skin is warm and dry.  Neurological:     General: No focal deficit present.     Mental Status: She is alert and oriented to person, place, and time.  Psychiatric:        Mood and Affect: Mood normal.        Behavior: Behavior normal. Behavior is cooperative.   Data Reviewed:  Results are pending, will review when available.  Assessment and Plan: Principal Problem:   Hypokalemia Secondary to diarrhea  In the setting of:  Sapovirus viral infection. Observation/telemetry. Continue IV fluids. Continue potassium replacement. Avoid  Imodium or Lomotil.  Follow-up potassium level in AM.  Active Problems:   Essential hypertension Continue losartan 100 mg p.o. daily.    Gastroesophageal reflux disease Continue pantoprazole 40 mg p.o. daily.    Hyperglycemia Check fasting level in the morning.    Pure hypercholesterolemia Currently not on medical therapy. Follow-up with primary care provider.    Normocytic anemia Monitor hematocrit and hemoglobin. Transfuse as needed.     Advance Care Planning:   Code Status: Full Code   Consults:   Family Communication:   Severity of Illness: The appropriate patient status for this patient is OBSERVATION. Observation status is judged to be reasonable and necessary in order to provide the required intensity of service to ensure the patient's safety. The patient's presenting symptoms, physical exam findings, and initial radiographic and laboratory data in the context of their medical condition is felt to place them at decreased risk for further clinical deterioration. Furthermore, it is anticipated that the patient will be medically stable for discharge from the hospital within 2 midnights of admission.   Author: Reubin Milan, MD 10/28/2022 3:46 PM  For on call review [email or web address].   This document was prepared using Dragon voice recognition software and may contain some unintended transcription errors.

## 2022-10-28 NOTE — ED Notes (Signed)
Asked pt if she could use the bathroom she said not at this time

## 2022-10-28 NOTE — Telephone Encounter (Signed)
Called patient to discuss GI stool PCR panel results. Patient is unavailable, currently in the bathroom. I spoke with daughter [place] with patient's approval.   [place] informed me that patient has had ongoing diarrhea all weekend. She has not tolerated PO intake secondary to feeling so poorly. We discussed that testing was positive for Sapovirus so anti diarrheal agents should not be used. Patient is very worried about dehydration. I saw patient twice last week for IVF and labs. Unfortunately with her ongoing symptoms and reports of worsening diarrhea she would best be served to be evaluated in the ED as she has failed outpatient management. I spoke with ED charge RN Patty to inform her that patient is on the way.

## 2022-10-28 NOTE — ED Notes (Signed)
ED TO INPATIENT HANDOFF REPORT  ED Nurse Name and Phone #: Renette Butters Name/Age/Gender Cynthia Velasquez 72 y.o. female Room/Bed: WA23/WA23  Code Status   Code Status: Full Code  Home/SNF/Other Home Patient oriented to: self, place, time, and situation Is this baseline? Yes   Triage Complete: Triage complete  Chief Complaint Hypokalemia [E87.6]  Triage Note Arrived POV from cancer center for diarrhea, fatigue, and weakness that has been occurring for 3 weeks. Pt has t cell lymphoma and received her last chemo treatment this week.   Pt tested positive for sapovirus in stool and klebsiella in urine   Allergies Allergies  Allergen Reactions   Tramadol Itching and Rash   Codeine Itching and Rash   Vicodin [Hydrocodone-Acetaminophen] Itching and Rash    Level of Care/Admitting Diagnosis ED Disposition     ED Disposition  Admit   Condition  --   Comment  Hospital Area: [place] [[id number]]  Level of Care: Telemetry [5]  Admit to tele based on following criteria: Monitor QTC interval  May place patient in observation at [facility] or [place] if equivalent level of care is available:: No  Covid Evaluation: Asymptomatic - no recent exposure (last 10 days) testing not required  Diagnosis: Hypokalemia [id number]  Admitting Physician: Reubin Milan [id number]  Attending Physician: Reubin Milan [id number]          B Medical/Surgery History Past Medical History:  Diagnosis Date   Anal cancer ([place]) dx'd 05/2019   Anxiety    Arthritis    Bilateral breast cancer ([place]) 10/21/2013   Breast cancer ([place])    Cancer ([place])    Depression    Gallstones    GERD (gastroesophageal reflux disease)    doesn't take any meds for this   Headache    h/o migraines       History of bladder infections    History of hiatal hernia    History of migraine    Hyperlipidemia    takes Simvastatin daily   Hypertension    takes Hyzaar   Joint pain    Joint  swelling    Lumbar stenosis    Neuropathy 09/07/2013   Pneumonia    Pre-diabetes    Past Surgical History:  Procedure Laterality Date   ABDOMINAL HYSTERECTOMY     bone spur removed from left foot     CHOLECYSTECTOMY     COLONOSCOPY     COLONOSCOPY WITH PROPOFOL Bilateral 12/07/2021   Procedure: COLONOSCOPY WITH PROPOFOL;  Surgeon: Clarene Essex, MD;  [facility]: [facility];  Service: Endoscopy;  Laterality: Bilateral;   double mastectomy      ESOPHAGOGASTRODUODENOSCOPY N/A 12/07/2021   Procedure: ESOPHAGOGASTRODUODENOSCOPY (EGD);  Surgeon: Clarene Essex, MD;  Location: [facility];  Service: Endoscopy;  Laterality: N/A;   HERNIA REPAIR     umbilical   IR IMAGING GUIDED PORT INSERTION  06/25/2022   right knee arthroscopy     right shoulder arthroscopy     surgery for hiatal hernia     TONSILLECTOMY     TOTAL HIP ARTHROPLASTY Left 02/12/2013   TOTAL HIP ARTHROPLASTY Left 02/12/2013   Procedure: LEFT TOTAL HIP ARTHROPLASTY;  Surgeon: Kerin Salen, MD;  Location: [place];  Service: Orthopedics;  Laterality: Left;   TOTAL HIP ARTHROPLASTY Right 05/03/2013   Procedure: TOTAL HIP ARTHROPLASTY;  Surgeon: Kerin Salen, MD;  Location: [place];  Service: Orthopedics;  Laterality: Right;   TOTAL KNEE ARTHROPLASTY Right 02/27/2015   Procedure: TOTAL  KNEE ARTHROPLASTY;  Surgeon: Frederik Pear, MD;  Location: [place];  Service: Orthopedics;  Laterality: Right;   TOTAL SHOULDER ARTHROPLASTY Right 09/12/2016   Procedure: RIGHT TOTAL SHOULDER ARTHROPLASTY;  Surgeon: Tania Ade, MD;  Location: [place];  Service: Orthopedics;  Laterality: Right;  RIGHT TOTAL SHOULDER ARTHROPLASTY     A IV Location/Drains/Wounds Patient Lines/Drains/Airways Status     Active Line/Drains/Airways     Name Placement date Placement time Site Days   Implanted Port 06/25/22 Right Chest 06/25/22  1308  Chest  125   External Urinary Catheter 10/28/22  1454  --  less than 1            Intake/Output Last 24  hours  Intake/Output Summary (Last 24 hours) at 10/28/2022 1601 Last data filed at 10/28/2022 1516 Gross per 24 hour  Intake 1000 ml  Output 1 ml  Net 999 ml    Labs/Imaging Results for orders placed or performed during the hospital encounter of 10/28/22 (from the past 48 hour(s))  Lipase, blood     Status: None   Collection Time: 10/28/22 12:49 PM  Result Value Ref Range   Lipase 29 11 - 51 U/L    Comment: Performed at [facility], [place] [address] [place], [place] [address]  Comprehensive metabolic panel     Status: Abnormal   Collection Time: 10/28/22 12:49 PM  Result Value Ref Range   Sodium 140 135 - 145 mmol/L   Potassium 2.5 (LL) 3.5 - 5.1 mmol/L    Comment: CRITICAL RESULT CALLED TO, READ BACK BY AND VERIFIED WITH BLAIR,I. RN AT 1337 10/28/22 MULLINS,T    Chloride 117 (H) 98 - 111 mmol/L   CO2 14 (L) 22 - 32 mmol/L   Glucose, Bld 120 (H) 70 - 99 mg/dL    Comment: Glucose reference range applies only to samples taken after fasting for at least 8 hours.   BUN 13 8 - 23 mg/dL   Creatinine, Ser 1.13 (H) 0.44 - 1.00 mg/dL   Calcium 8.7 (L) 8.9 - 10.3 mg/dL   Total Protein 7.0 6.5 - 8.1 g/dL   Albumin 3.4 (L) 3.5 - 5.0 g/dL   AST 56 (H) 15 - 41 U/L   ALT 142 (H) 0 - 44 U/L   Alkaline Phosphatase 93 38 - 126 U/L   Total Bilirubin 0.6 0.3 - 1.2 mg/dL   GFR, Estimated 52 (L) >60 mL/min    Comment: (NOTE) Calculated using the CKD-EPI Creatinine Equation (2021)    Anion gap 9 5 - 15    Comment: Performed at [facility], [place] [address], [place], [place] [address]  CBC     Status: Abnormal   Collection Time: 10/28/22 12:49 PM  Result Value Ref Range   WBC 4.7 4.0 - 10.5 K/uL   RBC 3.74 (L) 3.87 - 5.11 MIL/uL   Hemoglobin 10.5 (L) 12.0 - 15.0 g/dL   HCT 34.4 (L) 36.0 - 46.0 %   MCV 92.0 80.0 - 100.0 fL   MCH 28.1 26.0 - 34.0 pg   MCHC 30.5 30.0 - 36.0 g/dL   RDW 15.6 (H) 11.5 - 15.5 %   Platelets 149 (L) 150 - 400 K/uL   nRBC 0.0  0.0 - 0.2 %    Comment: Performed at [facility], [place] [address], [place], [place] [address]   No results found.  Pending Labs [employer] (From admission, onward)     Start     Ordered  10/29/22 0500  CBC  Tomorrow morning,   R        10/28/22 1552   10/29/22 0500  Comprehensive metabolic panel  Tomorrow morning,   R        10/28/22 1552   10/28/22 1552  Magnesium  Add-on,   AD        10/28/22 1552   10/28/22 1552  Phosphorus  Add-on,   AD        10/28/22 1552   10/28/22 1234  Urinalysis, Routine w reflex microscopic -Urine, Clean Catch  Once,   URGENT       Question:  Specimen Source  Answer:  Urine, Clean Catch   10/28/22 1235            Vitals/Pain Today's Vitals   10/28/22 1330 10/28/22 1400 10/28/22 1500 10/28/22 1530  BP: (!) 123/108 117/77 127/83 (!) 144/112  Pulse: 88 85 69 71  Resp: 19 14 18 18  $ Temp:      TempSrc:      SpO2: 98% 99% 99% 100%  Weight:      Height:      PainSc:        Isolation Precautions No active isolations  Medications Medications  potassium chloride 10 mEq in 100 mL IVPB (10 mEq Intravenous New Bag/Given 10/28/22 1545)  acetaminophen (TYLENOL) tablet 650 mg (has no administration in time range)    Or  acetaminophen (TYLENOL) suppository 650 mg (has no administration in time range)  prochlorperazine (COMPAZINE) injection 10 mg (has no administration in time range)  0.9 % NaCl with KCl 20 mEq/ L  infusion (has no administration in time range)  sodium chloride 0.9 % bolus 1,000 mL (0 mLs Intravenous Stopped 10/28/22 1516)  potassium chloride SA (KLOR-CON M) CR tablet 40 mEq (40 mEq Oral Given 10/28/22 1541)    Mobility walks with device     Focused Assessments     R Recommendations: See Admitting Provider Note  Report given to:   Additional Notes:

## 2022-10-28 NOTE — ED Provider Notes (Cosign Needed Addendum)
[place] [facility]ERGENC[facility] DEPARTMENT AT [facility] Provider Note   CSN: [id number] Arrival date & time: 10/28/22  1145     History  Chief Complaint  Patient presents with   Diarrhea    Cynthia Velasquez is a 72 y.o. female.  Pt reports she has been having diarrhea for 3 weeks. Pt reports increasing weakness.  Pt is followed by Oncology.  Pt has had Iv fluids 2 times over the last week.  Oncology advised pt to come to Ed today for treatment.  Pt reports she has continuous stool leaking.  Pt tested positive for sapovirus   The history is provided by the patient. No language interpreter was used.  Diarrhea Severity:  Moderate Onset quality:  Gradual Progression:  Worsening Relieved by:  Nothing Ineffective treatments:  None tried Associated symptoms: no abdominal pain   Risk factors: no sick contacts        Home Medications Prior to Admission medications   Medication Sig Start Date End Date Taking? Authorizing Provider  acetaminophen (TYLENOL) 500 MG tablet Take 1,000 mg by mouth every 6 (six) hours as needed for mild pain.   Yes [provider]  acyclovir (ZOVIRAX) 400 MG tablet Take 1 tablet (400 mg total) by mouth daily. 06/27/22  Yes Brunetta Genera, MD  aspirin-acetaminophen-caffeine [facility] MIGRAINE) [id number] MG tablet Take 2 tablets by mouth every 6 (six) hours as needed for headache.   Yes [provider]  baclofen (LIORESAL) 10 MG tablet Take 1 tablet (10 mg total) by mouth 3 (three) times daily as needed for muscle spasms. 09/23/22  Yes Vaslow, Acey Lav, MD  loratadine (CLARITIN) 10 MG tablet TAKE 1 TABLET(10 MG) BY MOUTH DAILY Patient taking differently: Take 10 mg by mouth daily as needed for allergies. 08/26/22  Yes Brunetta Genera, MD  losartan (COZAAR) 100 MG tablet Take 100 mg by mouth daily. 01/14/17  Yes [provider]  Menthol-Methyl Salicylate (SALONPAS PAIN RELIEF PATCH EX) Apply 1 patch topically daily as needed  (pain).   Yes [provider]  nitrofurantoin, macrocrystal-monohydrate, (MACROBID) 100 MG capsule Take 1 capsule (100 mg total) by mouth 2 (two) times daily for 5 days. 10/24/22 10/29/22 Yes Walisiewicz, Kaitlyn E, PA-C  nystatin (MYCOSTATIN/NYSTOP) powder Apply 1 Application topically 3 (three) times daily. Patient taking differently: Apply 1 Application topically 3 (three) times daily as needed (redness, rash). 08/30/22  Yes Walisiewicz, Kaitlyn E, PA-C  ondansetron (ZOFRAN) 8 MG tablet Take 1 tablet (8 mg total) by mouth every 8 (eight) hours as needed for nausea or vomiting. Start on the third day after cyclophosphamide chemotherapy. 06/27/22  Yes Brunetta Genera, MD  potassium chloride SA (KLOR-CON M) 20 MEQ tablet Take 1 tablet (20 mEq total) by mouth 2 (two) times daily for 7 days. 10/24/22 10/31/22 Yes Walisiewicz, Kaitlyn E, PA-C  prochlorperazine (COMPAZINE) 10 MG tablet Take 1 tablet (10 mg total) by mouth every 6 (six) hours as needed for nausea or vomiting. 06/27/22  Yes Brunetta Genera, MD  Saccharomyces boulardii 500 MG PACK Take 500 mg by mouth 2 (two) times daily with a meal. 10/22/22  Yes Brunetta Genera, MD  senna-docusate (SENNA S) 8.6-50 MG tablet Take 2 tablets by mouth at bedtime. Patient taking differently: Take 2 tablets by mouth at bedtime as needed for mild constipation. 07/08/22  Yes Brunetta Genera, MD  DULoxetine (CYMBALTA) 30 MG capsule Take 2 capsules (60 mg total) by mouth at bedtime. Take 1 capsule (30 mg) at  bedtime for 1 week, if no side effects, then increase to 2 capsules (60 mg) thereafter. Patient not taking: Reported on 10/28/2022 10/11/22   Shellia Carwin, MD  famotidine (PEPCID) 20 MG tablet Take 1 tablet (20 mg total) by mouth 2 (two) times daily. Patient not taking: Reported on 10/28/2022 08/07/22   Godfrey Pick, MD  HYDROcodone-acetaminophen [facility]) 5-325 MG tablet Take 1 tablet by mouth every 6 (six) hours as needed for moderate  pain. Patient not taking: Reported on 10/11/2022 06/27/22   Brunetta Genera, MD  lidocaine-prilocaine (EMLA) cream Apply to affected area once Patient not taking: Reported on 08/20/2022 07/08/22   Brunetta Genera, MD  predniSONE (DELTASONE) 20 MG tablet Take 3 tablets (60 mg total) by mouth daily. Take for 2 days starting the day after chemotherapy on day 4 Patient not taking: Reported on 10/28/2022 08/26/22   Brunetta Genera, MD      Allergies    Tramadol, Codeine, and Vicodin [hydrocodone-acetaminophen]    Review of Systems   Review of Systems  Gastrointestinal:  Positive for diarrhea. Negative for abdominal pain.  All other systems reviewed and are negative.   Physical Exam Updated Vital Signs BP 127/83   Pulse 69   Temp 97.8 F (36.6 C) (Oral)   Resp 18   Ht 5' 5"$  (1.651 m)   Wt 94.3 kg   SpO2 99%   BMI 34.60 kg/m  Physical Exam Vitals and nursing note reviewed.  Constitutional:      General: She is not in acute distress.    Appearance: She is well-developed.  HENT:     Head: Normocephalic and atraumatic.  Eyes:     Conjunctiva/sclera: Conjunctivae normal.  Cardiovascular:     Rate and Rhythm: Normal rate and regular rhythm.     Heart sounds: No murmur heard. Pulmonary:     Effort: Pulmonary effort is normal. No respiratory distress.     Breath sounds: Normal breath sounds.  Abdominal:     Palpations: Abdomen is soft.     Tenderness: There is no abdominal tenderness.  Musculoskeletal:        General: No swelling.     Cervical back: Neck supple.  Skin:    General: Skin is warm and dry.     Capillary Refill: Capillary refill takes less than 2 seconds.  Neurological:     Mental Status: She is alert.  Psychiatric:        Mood and Affect: Mood normal.     ED Results / Procedures / Treatments   Labs (all labs ordered are listed, but only abnormal results are displayed) Labs Reviewed  COMPREHENSIVE METABOLIC PANEL - Abnormal; Notable for the  following components:      Result Value   Potassium 2.5 (*)    Chloride 117 (*)    CO2 14 (*)    Glucose, Bld 120 (*)    Creatinine, Ser 1.13 (*)    Calcium 8.7 (*)    Albumin 3.4 (*)    AST 56 (*)    ALT 142 (*)    GFR, Estimated 52 (*)    All other components within normal limits  CBC - Abnormal; Notable for the following components:   RBC 3.74 (*)    Hemoglobin 10.5 (*)    HCT 34.4 (*)    RDW 15.6 (*)    Platelets 149 (*)    All other components within normal limits  LIPASE, BLOOD  URINALYSIS, ROUTINE W REFLEX MICROSCOPIC  EKG None  Radiology No results found.  Procedures Procedures    Medications Ordered in ED Medications  sodium chloride 0.9 % bolus 1,000 mL (0 mLs Intravenous Stopped 10/28/22 1516)    ED Course/ Medical Decision Making/ A&P                             Medical Decision Making Pt complains of diarrhea, feeling weak and dehydrated.  Pt advised to come in by Oncology   Amount and/or Complexity of Data Reviewed Labs: ordered. Decision-making details documented in ED Course.    Details: Potassium is 2.5  Discussion of management or test interpretation with external provider(s): [facility] consulted for admission   Risk Prescription drug management. Risk Details: Pt is hypokalemic and actively having diarrhea.  (Sapovirus)             Final Clinical Impression(s) / ED Diagnoses Final diagnoses:  Hypokalemia  Diarrhea of presumed infectious origin  Dehydration    Rx / DC Orders ED Discharge Orders     None         Sidney Ace 10/28/22 [place], [place] 10/28/22 [place], DO 10/29/22 [phone]

## 2022-10-28 NOTE — ED Triage Notes (Signed)
Arrived POV from cancer center for diarrhea, fatigue, and weakness that has been occurring for 3 weeks. Pt has t cell lymphoma and received her last chemo treatment this week.

## 2022-10-28 NOTE — ED Triage Notes (Signed)
Pt tested positive for sapovirus in stool and klebsiella in urine

## 2022-10-29 ENCOUNTER — Observation Stay (HOSPITAL_COMMUNITY): Payer: PPO

## 2022-10-29 ENCOUNTER — Telehealth: Payer: Self-pay

## 2022-10-29 ENCOUNTER — Encounter: Payer: Self-pay | Admitting: Neurology

## 2022-10-29 DIAGNOSIS — A09 Infectious gastroenteritis and colitis, unspecified: Secondary | ICD-10-CM | POA: Diagnosis not present

## 2022-10-29 DIAGNOSIS — R739 Hyperglycemia, unspecified: Secondary | ICD-10-CM | POA: Diagnosis not present

## 2022-10-29 DIAGNOSIS — B9689 Other specified bacterial agents as the cause of diseases classified elsewhere: Secondary | ICD-10-CM

## 2022-10-29 DIAGNOSIS — N390 Urinary tract infection, site not specified: Secondary | ICD-10-CM | POA: Diagnosis not present

## 2022-10-29 DIAGNOSIS — I10 Essential (primary) hypertension: Secondary | ICD-10-CM

## 2022-10-29 DIAGNOSIS — C8448 Peripheral T-cell lymphoma, not classified, lymph nodes of multiple sites: Secondary | ICD-10-CM

## 2022-10-29 DIAGNOSIS — K449 Diaphragmatic hernia without obstruction or gangrene: Secondary | ICD-10-CM | POA: Diagnosis not present

## 2022-10-29 DIAGNOSIS — E876 Hypokalemia: Secondary | ICD-10-CM | POA: Diagnosis not present

## 2022-10-29 DIAGNOSIS — K219 Gastro-esophageal reflux disease without esophagitis: Secondary | ICD-10-CM

## 2022-10-29 DIAGNOSIS — D61818 Other pancytopenia: Secondary | ICD-10-CM

## 2022-10-29 LAB — COMPREHENSIVE METABOLIC PANEL
ALT: 99 U/L — ABNORMAL HIGH (ref 0–44)
AST: 31 U/L (ref 15–41)
Albumin: 2.9 g/dL — ABNORMAL LOW (ref 3.5–5.0)
Alkaline Phosphatase: 80 U/L (ref 38–126)
Anion gap: 6 (ref 5–15)
BUN: 11 mg/dL (ref 8–23)
CO2: 15 mmol/L — ABNORMAL LOW (ref 22–32)
Calcium: 8 mg/dL — ABNORMAL LOW (ref 8.9–10.3)
Chloride: 118 mmol/L — ABNORMAL HIGH (ref 98–111)
Creatinine, Ser: 0.92 mg/dL (ref 0.44–1.00)
GFR, Estimated: >60 mL/min (ref >60)
Glucose, Bld: 96 mg/dL (ref 70–99)
Potassium: 3.1 mmol/L — ABNORMAL LOW (ref 3.5–5.1)
Sodium: 139 mmol/L (ref 135–145)
Total Bilirubin: 0.4 mg/dL (ref 0.3–1.2)
Total Protein: 5.7 g/dL — ABNORMAL LOW (ref 6.5–8.1)

## 2022-10-29 LAB — CBC
HCT: 27.7 % — ABNORMAL LOW (ref 36.0–46.0)
Hemoglobin: 8.4 g/dL — ABNORMAL LOW (ref 12.0–15.0)
MCH: 28.2 pg (ref 26.0–34.0)
MCHC: 30.3 g/dL (ref 30.0–36.0)
MCV: 93 fL (ref 80.0–100.0)
Platelets: 129 10*3/uL — ABNORMAL LOW (ref 150–400)
RBC: 2.98 MIL/uL — ABNORMAL LOW (ref 3.87–5.11)
RDW: 15.8 % — ABNORMAL HIGH (ref 11.5–15.5)
WBC: 2.8 10*3/uL — ABNORMAL LOW (ref 4.0–10.5)
nRBC: 0 % (ref 0.0–0.2)

## 2022-10-29 MED ORDER — SODIUM CHLORIDE 0.9% FLUSH
10.0000 mL | INTRAVENOUS | Status: DC | PRN
  Administered 2022-11-01 – 2022-11-02 (×2): 10 mL

## 2022-10-29 MED ORDER — K PHOS MONO-SOD PHOS DI & MONO 155-852-130 MG PO TABS
500.0000 mg | ORAL_TABLET | Freq: Two times a day (BID) | ORAL | Status: AC
  Administered 2022-10-29 (×2): 500 mg via ORAL
  Filled 2022-10-29 (×2): qty 2

## 2022-10-29 MED ORDER — POTASSIUM CHLORIDE CRYS ER 20 MEQ PO TBCR
40.0000 meq | EXTENDED_RELEASE_TABLET | ORAL | Status: AC
  Administered 2022-10-29 (×2): 40 meq via ORAL
  Filled 2022-10-29 (×2): qty 2

## 2022-10-29 MED ORDER — LOPERAMIDE HCL 2 MG PO CAPS
4.0000 mg | ORAL_CAPSULE | Freq: Once | ORAL | Status: DC
  Filled 2022-10-29: qty 2

## 2022-10-29 MED ORDER — SODIUM CHLORIDE 0.9 % IV SOLN
1.0000 g | INTRAVENOUS | Status: AC
  Administered 2022-10-29 – 2022-11-02 (×5): 1 g via INTRAVENOUS
  Filled 2022-10-29 (×5): qty 10

## 2022-10-29 MED ORDER — CHLORHEXIDINE GLUCONATE CLOTH 2 % EX PADS
6.0000 | MEDICATED_PAD | Freq: Every day | CUTANEOUS | Status: DC
  Administered 2022-10-29 – 2022-11-05 (×8): 6 via TOPICAL

## 2022-10-29 MED ORDER — IOHEXOL 300 MG/ML  SOLN
80.0000 mL | Freq: Once | INTRAMUSCULAR | Status: AC | PRN
  Administered 2022-10-29: 80 mL via INTRAVENOUS

## 2022-10-29 MED ORDER — DIPHENOXYLATE-ATROPINE 2.5-0.025 MG PO TABS
2.0000 | ORAL_TABLET | Freq: Once | ORAL | Status: AC
  Administered 2022-10-29: 2 via ORAL
  Filled 2022-10-29: qty 2

## 2022-10-29 NOTE — Hospital Course (Signed)
72 y.o. female with medical history significant of anal cancer (Dx 06/2019), anxiety, depression, osteoarthritis, left breast cancer (Stage Iia, Dx 08/2013), gallstones, GERD, hiatal hernia, migraine headaches, hyperlipidemia, hypertension, peripheral neuropathy, prediabetes who was sent from the cancer center due to diarrhea for the past 3 weeks.  Upon evaluation at the cancer center on 2/9 for her ongoing symptoms, stool studies were obtained and came back positive for Sapovirus.  Due to patient's severe unrelenting symptoms patient presented to [facility] emergency department for evaluation from the cancer center.  Upon evaluation in the cancer center patient was found to be hypokalemic and volume depleted.  The hospitalist group was called to assess the patient for admission to the hospital.  Patient was also identified to have Klebsiella pneumoniae urinary tract infection based on urine culture from 2/8.

## 2022-10-29 NOTE — Telephone Encounter (Signed)
Called Cynthia Velasquez and reported Dr. Karren Burly answer. She is still in the hospital and has PT coming up. She will do OT but she wants to get her strength up and start PT said she would call to inform us when she would like to start OT>

## 2022-10-30 ENCOUNTER — Encounter: Payer: Self-pay | Admitting: Hematology

## 2022-10-30 ENCOUNTER — Inpatient Hospital Stay (HOSPITAL_COMMUNITY): Payer: PPO

## 2022-10-30 DIAGNOSIS — B961 Klebsiella pneumoniae [K. pneumoniae] as the cause of diseases classified elsewhere: Secondary | ICD-10-CM | POA: Diagnosis present

## 2022-10-30 DIAGNOSIS — I10 Essential (primary) hypertension: Secondary | ICD-10-CM | POA: Diagnosis present

## 2022-10-30 DIAGNOSIS — A09 Infectious gastroenteritis and colitis, unspecified: Secondary | ICD-10-CM | POA: Diagnosis not present

## 2022-10-30 DIAGNOSIS — T451X5A Adverse effect of antineoplastic and immunosuppressive drugs, initial encounter: Secondary | ICD-10-CM | POA: Diagnosis present

## 2022-10-30 DIAGNOSIS — E872 Acidosis, unspecified: Secondary | ICD-10-CM | POA: Diagnosis not present

## 2022-10-30 DIAGNOSIS — D849 Immunodeficiency, unspecified: Secondary | ICD-10-CM | POA: Diagnosis present

## 2022-10-30 DIAGNOSIS — A0839 Other viral enteritis: Secondary | ICD-10-CM | POA: Diagnosis present

## 2022-10-30 DIAGNOSIS — E7800 Pure hypercholesterolemia, unspecified: Secondary | ICD-10-CM

## 2022-10-30 DIAGNOSIS — R079 Chest pain, unspecified: Secondary | ICD-10-CM | POA: Diagnosis not present

## 2022-10-30 DIAGNOSIS — E860 Dehydration: Secondary | ICD-10-CM

## 2022-10-30 DIAGNOSIS — Z6834 Body mass index (BMI) 34.0-34.9, adult: Secondary | ICD-10-CM | POA: Diagnosis not present

## 2022-10-30 DIAGNOSIS — D61810 Antineoplastic chemotherapy induced pancytopenia: Secondary | ICD-10-CM | POA: Diagnosis present

## 2022-10-30 DIAGNOSIS — Z79899 Other long term (current) drug therapy: Secondary | ICD-10-CM | POA: Diagnosis not present

## 2022-10-30 DIAGNOSIS — N390 Urinary tract infection, site not specified: Secondary | ICD-10-CM | POA: Diagnosis present

## 2022-10-30 DIAGNOSIS — R7303 Prediabetes: Secondary | ICD-10-CM | POA: Diagnosis present

## 2022-10-30 DIAGNOSIS — F419 Anxiety disorder, unspecified: Secondary | ICD-10-CM | POA: Diagnosis present

## 2022-10-30 DIAGNOSIS — R739 Hyperglycemia, unspecified: Secondary | ICD-10-CM | POA: Diagnosis present

## 2022-10-30 DIAGNOSIS — C8448 Peripheral T-cell lymphoma, not classified, lymph nodes of multiple sites: Secondary | ICD-10-CM | POA: Diagnosis present

## 2022-10-30 DIAGNOSIS — B3731 Acute candidiasis of vulva and vagina: Secondary | ICD-10-CM | POA: Diagnosis present

## 2022-10-30 DIAGNOSIS — E669 Obesity, unspecified: Secondary | ICD-10-CM | POA: Diagnosis present

## 2022-10-30 DIAGNOSIS — F32A Depression, unspecified: Secondary | ICD-10-CM | POA: Diagnosis present

## 2022-10-30 DIAGNOSIS — E876 Hypokalemia: Secondary | ICD-10-CM | POA: Diagnosis present

## 2022-10-30 DIAGNOSIS — K219 Gastro-esophageal reflux disease without esophagitis: Secondary | ICD-10-CM | POA: Diagnosis present

## 2022-10-30 DIAGNOSIS — R197 Diarrhea, unspecified: Secondary | ICD-10-CM | POA: Diagnosis present

## 2022-10-30 LAB — COMPREHENSIVE METABOLIC PANEL
ALT: 74 U/L — ABNORMAL HIGH (ref 0–44)
ALT: 70 U/L — ABNORMAL HIGH (ref 0–44)
AST: 21 U/L (ref 15–41)
AST: 21 U/L (ref 15–41)
Albumin: 2.7 g/dL — ABNORMAL LOW (ref 3.5–5.0)
Albumin: 2.7 g/dL — ABNORMAL LOW (ref 3.5–5.0)
Alkaline Phosphatase: 76 U/L (ref 38–126)
Alkaline Phosphatase: 72 U/L (ref 38–126)
Anion gap: 6 (ref 5–15)
Anion gap: 4 — ABNORMAL LOW (ref 5–15)
BUN: <5 mg/dL — ABNORMAL LOW (ref 8–23)
BUN: <5 mg/dL — ABNORMAL LOW (ref 8–23)
CO2: 15 mmol/L — ABNORMAL LOW (ref 22–32)
CO2: 16 mmol/L — ABNORMAL LOW (ref 22–32)
Calcium: 8.4 mg/dL — ABNORMAL LOW (ref 8.9–10.3)
Calcium: 8.4 mg/dL — ABNORMAL LOW (ref 8.9–10.3)
Chloride: 118 mmol/L — ABNORMAL HIGH (ref 98–111)
Chloride: 119 mmol/L — ABNORMAL HIGH (ref 98–111)
Creatinine, Ser: 0.82 mg/dL (ref 0.44–1.00)
Creatinine, Ser: 0.82 mg/dL (ref 0.44–1.00)
GFR, Estimated: >60 mL/min (ref >60)
GFR, Estimated: >60 mL/min (ref >60)
Glucose, Bld: 93 mg/dL (ref 70–99)
Glucose, Bld: 90 mg/dL (ref 70–99)
Potassium: 3.4 mmol/L — ABNORMAL LOW (ref 3.5–5.1)
Potassium: 3.4 mmol/L — ABNORMAL LOW (ref 3.5–5.1)
Sodium: 139 mmol/L (ref 135–145)
Sodium: 139 mmol/L (ref 135–145)
Total Bilirubin: 0.5 mg/dL (ref 0.3–1.2)
Total Bilirubin: 0.5 mg/dL (ref 0.3–1.2)
Total Protein: 5.8 g/dL — ABNORMAL LOW (ref 6.5–8.1)
Total Protein: 5.7 g/dL — ABNORMAL LOW (ref 6.5–8.1)

## 2022-10-30 LAB — PHOSPHORUS
Phosphorus: 2.1 mg/dL — ABNORMAL LOW (ref 2.5–4.6)
Phosphorus: 2 mg/dL — ABNORMAL LOW (ref 2.5–4.6)

## 2022-10-30 LAB — CBC WITH DIFFERENTIAL/PLATELET
Abs Immature Granulocytes: 0.16 10*3/uL — ABNORMAL HIGH (ref 0.00–0.07)
Abs Immature Granulocytes: 0.38 10*3/uL — ABNORMAL HIGH (ref 0.00–0.07)
Basophils Absolute: 0 10*3/uL (ref 0.0–0.1)
Basophils Absolute: 0 10*3/uL (ref 0.0–0.1)
Basophils Relative: 1 %
Basophils Relative: 1 %
Eosinophils Absolute: 0.1 10*3/uL (ref 0.0–0.5)
Eosinophils Absolute: 0.1 10*3/uL (ref 0.0–0.5)
Eosinophils Relative: 5 %
Eosinophils Relative: 3 %
HCT: 27.7 % — ABNORMAL LOW (ref 36.0–46.0)
HCT: 27.7 % — ABNORMAL LOW (ref 36.0–46.0)
Hemoglobin: 8.4 g/dL — ABNORMAL LOW (ref 12.0–15.0)
Hemoglobin: 8.5 g/dL — ABNORMAL LOW (ref 12.0–15.0)
Immature Granulocytes: 6 %
Immature Granulocytes: 13 %
Lymphocytes Relative: 14 %
Lymphocytes Relative: 12 %
Lymphs Abs: 0.4 10*3/uL — ABNORMAL LOW (ref 0.7–4.0)
Lymphs Abs: 0.4 10*3/uL — ABNORMAL LOW (ref 0.7–4.0)
MCH: 28.4 pg (ref 26.0–34.0)
MCH: 28.5 pg (ref 26.0–34.0)
MCHC: 30.3 g/dL (ref 30.0–36.0)
MCHC: 30.7 g/dL (ref 30.0–36.0)
MCV: 93.6 fL (ref 80.0–100.0)
MCV: 93 fL (ref 80.0–100.0)
Monocytes Absolute: 0.9 10*3/uL (ref 0.1–1.0)
Monocytes Absolute: 1 10*3/uL (ref 0.1–1.0)
Monocytes Relative: 31 %
Monocytes Relative: 35 %
Neutro Abs: 1.2 10*3/uL — ABNORMAL LOW (ref 1.7–7.7)
Neutro Abs: 1.1 10*3/uL — ABNORMAL LOW (ref 1.7–7.7)
Neutrophils Relative %: 43 %
Neutrophils Relative %: 36 %
Platelets: 101 10*3/uL — ABNORMAL LOW (ref 150–400)
Platelets: 101 10*3/uL — ABNORMAL LOW (ref 150–400)
RBC: 2.96 MIL/uL — ABNORMAL LOW (ref 3.87–5.11)
RBC: 2.98 MIL/uL — ABNORMAL LOW (ref 3.87–5.11)
RDW: 16 % — ABNORMAL HIGH (ref 11.5–15.5)
RDW: 15.9 % — ABNORMAL HIGH (ref 11.5–15.5)
WBC: 2.8 10*3/uL — ABNORMAL LOW (ref 4.0–10.5)
WBC: 3 10*3/uL — ABNORMAL LOW (ref 4.0–10.5)
nRBC: 0 % (ref 0.0–0.2)
nRBC: 0 % (ref 0.0–0.2)

## 2022-10-30 LAB — MAGNESIUM
Magnesium: 1.4 mg/dL — ABNORMAL LOW (ref 1.7–2.4)
Magnesium: 1.4 mg/dL — ABNORMAL LOW (ref 1.7–2.4)

## 2022-10-30 LAB — TROPONIN I (HIGH SENSITIVITY): Troponin I (High Sensitivity): 3 ng/L (ref <18)

## 2022-10-30 LAB — TYPE AND SCREEN
ABO/RH(D): O POS
Antibody Screen: NEGATIVE

## 2022-10-30 MED ORDER — PANTOPRAZOLE SODIUM 40 MG PO TBEC
40.0000 mg | DELAYED_RELEASE_TABLET | Freq: Every day | ORAL | Status: DC
  Administered 2022-10-30 – 2022-11-05 (×7): 40 mg via ORAL
  Filled 2022-10-30 (×7): qty 1

## 2022-10-30 MED ORDER — POTASSIUM PHOSPHATES 15 MMOLE/5ML IV SOLN
15.0000 mmol | Freq: Once | INTRAVENOUS | Status: AC
  Administered 2022-10-30: 15 mmol via INTRAVENOUS
  Filled 2022-10-30: qty 5

## 2022-10-30 MED ORDER — SACCHAROMYCES BOULARDII 250 MG PO CAPS
250.0000 mg | ORAL_CAPSULE | Freq: Three times a day (TID) | ORAL | Status: DC
  Administered 2022-10-30 – 2022-11-05 (×17): 250 mg via ORAL
  Filled 2022-10-30 (×17): qty 1

## 2022-10-30 MED ORDER — POTASSIUM CHLORIDE IN NACL 20-0.9 MEQ/L-% IV SOLN
INTRAVENOUS | Status: AC
  Filled 2022-10-30 (×3): qty 1000

## 2022-10-30 MED ORDER — MAGNESIUM SULFATE 50 % IJ SOLN
3.0000 g | Freq: Once | INTRAVENOUS | Status: AC
  Administered 2022-10-30: 3 g via INTRAVENOUS
  Filled 2022-10-30: qty 6

## 2022-10-30 MED ORDER — POTASSIUM CHLORIDE 10 MEQ/100ML IV SOLN
10.0000 meq | INTRAVENOUS | Status: AC
  Administered 2022-10-30 (×4): 10 meq via INTRAVENOUS
  Filled 2022-10-30 (×4): qty 100

## 2022-10-30 MED ORDER — DIPHENOXYLATE-ATROPINE 2.5-0.025 MG PO TABS
2.0000 | ORAL_TABLET | Freq: Four times a day (QID) | ORAL | Status: DC | PRN
  Administered 2022-10-30 (×2): 2 via ORAL
  Filled 2022-10-30 (×2): qty 2

## 2022-10-30 NOTE — Assessment & Plan Note (Addendum)
Nearly 3-week course of diarrhea, presumably secondary to Sapovirus  seen on GI pathogen panel superimposed on chemotherapy related diarrhea C. difficile testing negative Prolonged course of illness likely secondary to patient's immunocompromised state status post last infusion of chemotherapy 2/6 Due to generalized abdominal tenderness on exam will obtain CT imaging of abdomen and pelvis Supportive care with intravenous fluids and replacement of electrolyte derangements Monitoring renal function and electrolytes with serial chemistries Per nursing, according to infection control patient requires enteric isolation Diarrhea is quite severe today we will give a one-time dose of Lomotil

## 2022-10-30 NOTE — Progress Notes (Signed)
  Transition of Care [facility]) Screening Note   Patient Details  Name: Cynthia Velasquez Date of Birth: July 25, 1951   [facility] of [facility]) CM/SW Contact:    Vassie Moselle, LCSW Phone Number: 10/30/2022, 2:47 PM    Transition of Care Department [facility]) has reviewed patient and no TOC needs have been identified at this time. We will continue to monitor patient advancement through interdisciplinary progression rounds. If new patient transition needs arise, please place a TOC consult.

## 2022-10-30 NOTE — Assessment & Plan Note (Signed)
Continuing home regimen of daily PPI therapy.  

## 2022-10-30 NOTE — Progress Notes (Signed)
PROGRESS NOTE   Cynthia Velasquez  [id number] DOB: May 30, 1951 DOA: 10/28/2022 PCP: Kathyrn Lass, MD   Date of Service: the patient was seen and examined on 10/30/2022  Brief Narrative:  72 y.o. female with medical history significant of anal cancer (Dx 06/2019), anxiety, depression, osteoarthritis, left breast cancer (Stage Iia, Dx 08/2013), gallstones, GERD, hiatal hernia, migraine headaches, hyperlipidemia, hypertension, peripheral neuropathy, prediabetes who was sent from the cancer center due to diarrhea for the past 3 weeks.  Upon evaluation at the cancer center on 2/9 for her ongoing symptoms, stool studies were obtained and came back positive for Sapovirus.  Due to patient's severe unrelenting symptoms patient presented to [facility] emergency department for evaluation from the cancer center.  Upon evaluation in the cancer center patient was found to be hypokalemic and volume depleted.  The hospitalist group was called to assess the patient for admission to the hospital.  Patient was also identified to have Klebsiella pneumoniae urinary tract infection based on urine culture from 2/8.     Assessment and Plan: * Acute infectious diarrhea Nearly 3-week course of diarrhea, presumably secondary to Sapovirus  seen on GI pathogen panel superimposed on chemotherapy related diarrhea C. difficile testing negative Prolonged course of illness likely secondary to patient's immunocompromised state status post last infusion of chemotherapy 2/6 Due to generalized abdominal tenderness on exam will obtain CT imaging of abdomen and pelvis Supportive care with intravenous fluids and replacement of electrolyte derangements Monitoring renal function and electrolytes with serial chemistries Per nursing, according to infection control patient requires enteric isolation Diarrhea is quite severe today we will give a one-time dose of Lomotil  UTI due to Klebsiella species Recent urinalysis and  urine culture obtained on 2/8 found to be growing out Klebsiella pneumoniae Patient has been placed on intravenous ceftriaxone, will likely treat for 5 days   Hypokalemia Persisting hypokalemia in the setting of diarrhea Replacing with several doses of potassium chloride Monitoring with serial chemistries  Pancytopenia (HCC) Notable pancytopenia in the setting of last chemotherapy infusion on 2/6 Monitoring counts with serial chemistries Supportive care for now Obtain type and screen Serial CBCs with differentials Adding Dr. Irene Limbo to the main team so that he is aware of the hospitalization and can provide insight as necessary.  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 Irene Limbo Has been receiving CHOEP every 21 days, last infusion of chemo was on 2/6  Essential hypertension Resume patients home regimen of oral antihypertensives Titrate antihypertensive regimen as necessary to achieve adequate BP control PRN intravenous antihypertensives for excessively elevated blood pressure   GERD without esophagitis Continuing home regimen of daily PPI therapy.    Subjective:  Patient complaining of continuing diarrhea.  Diarrhea is nonbloody but frequent and watery.  Patient reports at least 10 episodes of diarrhea today.  Patient complains of associated generalized abdominal pain, sore in quality, moderate intensity and radiating diffusely.  Physical Exam:  Vitals:   10/28/22 2212 10/29/22 0141 10/29/22 0511 10/29/22 2035  BP: (!) 140/84 123/76 118/69 (!) 150/89  Pulse: 77 74 66 86  Resp: 17 17 17 17  $ Temp: 98.5 F (36.9 C) 98.6 F (37 C) 97.8 F (36.6 C) 99.7 F (37.6 C)  TempSrc: Oral Oral Oral Oral  SpO2: 100% 100% 99%   Weight:      Height:        Constitutional: Awake alert and oriented x3, patient is in mild distress due to abdominal  pain. Skin: no rashes, no lesions, good skin turgor noted. Eyes: Pupils are  equally reactive to light.  No evidence of scleral icterus or conjunctival pallor.  ENMT: Moist mucous membranes noted.  Posterior pharynx clear of any exudate or lesions.   Respiratory: clear to auscultation bilaterally, no wheezing, no crackles. Normal respiratory effort. No accessory muscle use.  Cardiovascular: Regular rate and rhythm, no murmurs / rubs / gallops. No extremity edema. 2+ pedal pulses. No carotid bruits.  Abdomen: Abdomen is diffusely tender.  Abdomen is soft.  Hypoactive bowel sounds noted.   Musculoskeletal: No joint deformity upper and lower extremities. Good ROM, no contractures. Normal muscle tone.    Data Reviewed:  I have personally reviewed and interpreted labs, imaging.  Significant findings are   CBC: Recent Labs  Lab 10/24/22 1345 10/25/22 1120 10/28/22 1249 10/29/22 0430  WBC 3.8* 24.7* 4.7 2.8*  NEUTROABS 2.9 24.5*  --   --   HGB 9.3* 10.4* 10.5* 8.4*  HCT 28.3* 32.2* 34.4* 27.7*  MCV 86.5 88.2 92.0 93.0  PLT 260 264 149* [id number]*   Basic Metabolic Panel: Recent Labs  Lab 10/24/22 1345 10/25/22 1120 10/28/22 1249 10/29/22 0430  NA 139 138 140 139  K 3.1* 3.0* 2.5* 3.1*  CL 109 110 117* 118*  CO2 22 20* 14* 15*  GLUCOSE 72 90 120* 96  BUN 20 16 13 11  $ CREATININE 0.76 0.81 1.13* 0.92  CALCIUM 8.4* 8.6* 8.7* 8.0*  MG 1.5* 1.4* 1.8  --   PHOS  --   --  2.0*  --    GFR: Estimated Creatinine Clearance: 63.7 mL/min (by C-G formula based on SCr of 0.92 mg/dL). Liver Function Tests: Recent Labs  Lab 10/24/22 1345 10/25/22 1120 10/28/22 1249 10/29/22 0430  AST 18 37 56* 31  ALT 10 31 142* 99*  ALKPHOS 64 74 93 80  BILITOT 0.5 0.7 0.6 0.4  PROT 6.1* 6.8 7.0 5.7*  ALBUMIN 3.3* 3.5 3.4* 2.9*     Code Status:  Full code.  Code status decision has been confirmed with: patient    Severity of Illness:  The appropriate patient status for this patient is INPATIENT. Inpatient status is judged to be reasonable and necessary in order to  provide the required intensity of service to ensure the patient's safety. The patient's presenting symptoms, physical exam findings, and initial radiographic and laboratory data in the context of their chronic comorbidities is felt to place them at high risk for further clinical deterioration. Furthermore, it is not anticipated that the patient will be medically stable for discharge from the hospital within 2 midnights of admission.   * I certify that at the point of admission it is my clinical judgment that the patient will require inpatient hospital care spanning beyond 2 midnights from the point of admission due to high intensity of service, high risk for further deterioration and high frequency of surveillance required.*  Time spent:  57 minutes  Author:  Vernelle Emerald MD  10/30/2022 1:21 AM

## 2022-10-30 NOTE — Assessment & Plan Note (Signed)
.   Resume patients home regimen of oral antihypertensives . Titrate antihypertensive regimen as necessary to achieve adequate BP control . PRN intravenous antihypertensives for excessively elevated blood pressure   

## 2022-10-30 NOTE — Progress Notes (Signed)
Primary RN was reported from dayshift RN that MD ordered for rectal tube placement. This RN clarified orders with Olena Heckle, NP who informed that rectal tube is contraindicated for this patient because of pertinent history. Rectal tube will not be placed at this time. Patient updated with information and receptive. Plan of care ongoing.

## 2022-10-30 NOTE — Progress Notes (Addendum)
PROGRESS NOTE    Cynthia Velasquez  [id number] DOB: Apr 27, 1951 DOA: 10/28/2022 PCP: Kathyrn Lass, MD     Brief Narrative:  72 y.o. BF PMHx Anal cancer (Dx 06/2019), anxiety, depression, osteoarthritis, LEFT  breast cancer (Stage Iia, Dx 08/2013), gallstones, GERD, hiatal hernia, migraine headaches, hyperlipidemia, hypertension, peripheral neuropathy, prediabetes who was sent from the cancer center due to diarrhea for the past 3 weeks.   Upon evaluation at the cancer center on 2/9 for her ongoing symptoms, stool studies were obtained and came back positive for Sapovirus.  Due to patient's severe unrelenting symptoms patient presented to [facility] emergency department for evaluation from the cancer center.   Upon evaluation in the cancer center patient was found to be hypokalemic and volume depleted.  The hospitalist group was called to assess the patient for admission to the hospital.   Patient was also identified to have Klebsiella pneumoniae urinary tract infection based on urine culture from 2/8. NOTE patient last Colonoscopy 12/07/2021  Subjective: 2/14 states her last episode of XRT for Anal Ca 2023.  At that time negative diarrhea post Tx. States her diarrhea began prior to her chemotherapy. Has been 7 days since last dose of chemotherapy with no improvement.    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 how long would you treat for this virus? -2/14 stool osmotic gap: Stool Osm, (stool NA /K) pending - 2/14 fecal fat pending - 2/14 fecal leukocytes pending - 2/14 occult  blood pending -2/14 TSH pending - 2/14 n.p.o. -2/14 normal saline+ KCl 20 meq/L 176m/hr -Lomotil 2.5-0.025 mg - 2/14 increase Florastor 250 mg TID -2/14 if unable to control diarrhea/diagnose additional cause consult GI colonoscopy?  Positive Klebsiella UTI -2/14 complete 5-day course antibiotics.  May have to extend given patient's immunosuppression  Pancytopenia -Secondary to chemotherapy.  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  Last seen on 2/9 -Per oncology note 2/9  Last treatment:  Day 2   Cycle 6 on 10/23/22 -Last day of etopside was held 2/2 diarrhea so patient has completed course of chemo.  -2/14 will consult oncology in a.m. given that patient has completed chemo 7 days ago could still be having effect on her having diarrhea?  Chest pain -2/14 most likely secondary to patient's significant electrolyte abnormalities - 2/14  when compared to 10/28/22 no Significant change -2/14 trend troponin   Essential hypertension -Losartan 100 mg daily    GERD without esophagitis  Hypokalemia - Potassium goal>4 -K-Phos 15 mmol -Potassium IV 40 mEq -2/14 see diarrhea  Hypophosphatemia -Phosphorus goal>2.5  Hypomagnesmia -Magnesium goal>2 -Magnesium IV 3 g  Obesity (BMI 34.6 kg/m.) -Discussed with PCP        Mobility Assessment (last 72 hours)     Mobility Assessment     Row Name 10/29/22 2249 10/29/22 1304 10/28/22 2117       Does patient have an order for bedrest or is patient medically unstable No - Continue assessment No - Continue assessment No - Continue assessment  What is the highest level of mobility based on the progressive mobility assessment? Level 5 (Walks with assist in room/hall) - Balance while stepping forward/back and can walk in room with assist - Complete Level 5 (Walks with assist in room/hall) - Balance while stepping forward/back and can walk in  room with assist - Complete Level 5 (Walks with assist in room/hall) - Balance while stepping forward/back and can walk in room with assist - Complete                    DVT prophylaxis:  Code Status:  Family Communication:  Status is: Inpatient    Dispo: The patient is from: Home              Anticipated d/c is to: Home              Anticipated d/c date is: > 3 days              Patient currently is not medically stable to d/c.      Consultants:    Procedures/Significant Events:  2/13 CT Abd and Pelvis w/contrast; No evidence of bowel obstruction or free air. 2. Air-fluid levels in the colon, compatible with history of diarrhea. 3. Small hiatal hernia. 4. Aortic atherosclerosis. 5. 3 mm subpleural nodule in the right lower lobe. No follow-up needed if patient is low-risk.    I have personally reviewed and interpreted all radiology studies and my findings are as above.  VENTILATOR SETTINGS:    Cultures 2/8 stool positive Sapovirus 2/8 urine positive Klebsiella    Antimicrobials: Anti-infectives (From admission, onward)    Start     Dose/Rate Route Frequency Ordered Stop   10/29/22 1100  cefTRIAXone (ROCEPHIN) 1 g in sodium chloride 0.9 % 100 mL IVPB        1 g 200 mL/hr over 30 Minutes Intravenous Every 24 hours 10/29/22 0938     10/28/22 1915  acyclovir (ZOVIRAX) tablet 400 mg        400 mg Oral Daily 10/28/22 1828           Devices    LINES / TUBES:      Continuous Infusions:  0.9 % NaCl with KCl 20 mEq / L 75 mL/hr at 10/30/22 0142   cefTRIAXone (ROCEPHIN)  IV 1 g (10/29/22 1055)     Objective: Vitals:   10/29/22 0141 10/29/22 0511 10/29/22 2035 10/30/22 0443  BP: 123/76 118/69 (!) 150/89 139/81  Pulse: 74 66 86 73  Resp: 17 17 17 19  $ Temp: 98.6 F (37 C) 97.8 F (36.6 C) 99.7 F (37.6 C) 98.5 F (36.9 C)  TempSrc: Oral Oral Oral Oral  SpO2: 100% 99%    Weight:      Height:        Intake/Output Summary (Last 24  hours) at 10/30/2022 [id number] Last data filed at 10/29/2022 1200 Gross per 24 hour  Intake 1080 ml  Output --  Net 1080 ml   Filed Weights   10/28/22 1200 10/28/22 1225  Weight: 94.3 kg 94.3 kg    Examination:  General: A/O x 4, No acute respiratory distress Eyes: negative scleral hemorrhage, negative anisocoria, negative icterus ENT: Negative Runny nose, negative gingival bleeding, Neck:  Negative scars, masses, torticollis, lymphadenopathy, JVD Lungs: Clear to auscultation bilaterally without wheezes or crackles Cardiovascular: Regular rate and rhythm without murmur gallop or rub normal S1 and S2 Abdomen: Positive abdominal pain palpation RUQ/RLL, nondistended, positive soft, bowel sounds, no rebound,  no ascites, no appreciable mass Extremities: No significant cyanosis, clubbing, or edema bilateral lower extremities Skin: Negative rashes, lesions, ulcers Psychiatric:  Negative depression, negative anxiety, negative fatigue, negative mania  Central nervous system:  Cranial nerves II through XII intact, tongue/uvula midline, all extremities muscle strength 5/5, sensation intact throughout, negative dysarthria, negative expressive aphasia, negative receptive aphasia.  .     Data Reviewed: Care during the described time interval was provided by me .  I have reviewed this patient's available data, including medical history, events of note, physical examination, and all test results as part of my evaluation.  CBC: Recent Labs  Lab 10/24/22 1345 10/25/22 1120 10/28/22 1249 10/29/22 0430  WBC 3.8* 24.7* 4.7 2.8*  NEUTROABS 2.9 24.5*  --   --   HGB 9.3* 10.4* 10.5* 8.4*  HCT 28.3* 32.2* 34.4* 27.7*  MCV 86.5 88.2 92.0 93.0  PLT 260 264 149* [id number]*   Basic Metabolic Panel: Recent Labs  Lab 10/24/22 1345 10/25/22 1120 10/28/22 1249 10/29/22 0430  NA 139 138 140 139  K 3.1* 3.0* 2.5* 3.1*  CL 109 110 117* 118*  CO2 22 20* 14* 15*  GLUCOSE 72 90 120* 96  BUN 20 16 13 11   $ CREATININE 0.76 0.81 1.13* 0.92  CALCIUM 8.4* 8.6* 8.7* 8.0*  MG 1.5* 1.4* 1.8  --   PHOS  --   --  2.0*  --    GFR: Estimated Creatinine Clearance: 63.7 mL/min (by C-G formula based on SCr of 0.92 mg/dL). Liver Function Tests: Recent Labs  Lab 10/24/22 1345 10/25/22 1120 10/28/22 1249 10/29/22 0430  AST 18 37 56* 31  ALT 10 31 142* 99*  ALKPHOS 64 74 93 80  BILITOT 0.5 0.7 0.6 0.4  PROT 6.1* 6.8 7.0 5.7*  ALBUMIN 3.3* 3.5 3.4* 2.9*   Recent Labs  Lab 10/28/22 1249  LIPASE 29   No results for input(s): "AMMONIA" in the last 168 hours. Coagulation Profile: No results for input(s): "INR", "PROTIME" in the last 168 hours. Cardiac Enzymes: No results for input(s): "CKTOTAL", "CKMB", "CKMBINDEX", "TROPONINI" in the last 168 hours. BNP (last 3 results) No results for input(s): "PROBNP" in the last 8760 hours. HbA1C: No results for input(s): "HGBA1C" in the last 72 hours. CBG: No results for input(s): "GLUCAP" in the last 168 hours. Lipid Profile: No results for input(s): "CHOL", "HDL", "LDLCALC", "TRIG", "CHOLHDL", "LDLDIRECT" in the last 72 hours. Thyroid Function Tests: No results for input(s): "TSH", "T4TOTAL", "FREET4", "T3FREE", "THYROIDAB" in the last 72 hours. Anemia Panel: No results for input(s): "VITAMINB12", "FOLATE", "FERRITIN", "TIBC", "IRON", "RETICCTPCT" in the last 72 hours. Sepsis Labs: No results for input(s): "PROCALCITON", "LATICACIDVEN" in the last 168 hours.  Recent Results (from the past 240 hour(s))  Culture, Urine     Status: Abnormal   Collection Time: 10/24/22  2:40 PM   Specimen: Urine, Clean Catch  Result Value Ref Range Status   Specimen Description   Final    URINE, CLEAN CATCH Performed at [facility], [address], [place], [place] [address]    Special Requests   Final    NONE Performed at [facility], [place] [address], [place], [place] [address]    Culture 60,000 COLONIES/mL  KLEBSIELLA PNEUMONIAE (A)  Final   Report Status 10/26/2022 FINAL  Final   Organism ID, Bacteria KLEBSIELLA PNEUMONIAE (A)  Final      Susceptibility   Klebsiella pneumoniae - MIC*    AMPICILLIN RESISTANT Resistant  CEFEPIME <=0.12 SENSITIVE Sensitive     CEFTRIAXONE <=0.25 SENSITIVE Sensitive     CIPROFLOXACIN <=0.25 SENSITIVE Sensitive     GENTAMICIN <=1 SENSITIVE Sensitive     IMIPENEM <=0.25 SENSITIVE Sensitive     NITROFURANTOIN 32 SENSITIVE Sensitive     TRIMETH/SULFA <=20 SENSITIVE Sensitive     AMPICILLIN/SULBACTAM <=2 SENSITIVE Sensitive     PIP/TAZO <=4 SENSITIVE Sensitive     * 60,000 COLONIES/mL KLEBSIELLA PNEUMONIAE  C difficile quick screen w PCR reflex     Status: None   Collection Time: 10/24/22  3:35 PM   Specimen: STOOL  Result Value Ref Range Status   C Diff antigen NEGATIVE NEGATIVE Final   C Diff toxin NEGATIVE NEGATIVE Final   C Diff interpretation No C. difficile detected.  Final    Comment: Performed at [facility], [place] [address], [place], [place] [address]  GI pathogen panel by PCR, stool     Status: Abnormal   Collection Time: 10/24/22  3:35 PM   Specimen: Stool  Result Value Ref Range Status   Plesiomonas shigelloides NOT DETECTED NOT DETECTED Final   Yersinia enterocolitica NOT DETECTED NOT DETECTED Final   Vibrio NOT DETECTED NOT DETECTED Final   Enteropathogenic E coli NOT DETECTED NOT DETECTED Final   E coli (ETEC) LT/ST NOT DETECTED NOT DETECTED Final   E coli [id number] by PCR Not applicable NOT DETECTED Final   Cryptosporidium by PCR NOT DETECTED NOT DETECTED Final   Entamoeba histolytica NOT DETECTED NOT DETECTED Final   Adenovirus F 40/41 NOT DETECTED NOT DETECTED Final   Norovirus GI/GII NOT DETECTED NOT DETECTED Final   Sapovirus DETECTED (A) NOT DETECTED Final    Comment: (NOTE) Performed At: [facility] [place], [place] [id number] Rush Farmer MD [id number]    Vibrio cholerae NOT  DETECTED NOT DETECTED Final   Campylobacter by PCR NOT DETECTED NOT DETECTED Final   Salmonella by PCR NOT DETECTED NOT DETECTED Final   E coli (STEC) NOT DETECTED NOT DETECTED Final   Enteroaggregative E coli NOT DETECTED NOT DETECTED Final   Shigella by PCR NOT DETECTED NOT DETECTED Final   Cyclospora cayetanensis NOT DETECTED NOT DETECTED Final   Astrovirus NOT DETECTED NOT DETECTED Final   G lamblia by PCR NOT DETECTED NOT DETECTED Final   Rotavirus A by PCR NOT DETECTED NOT DETECTED Final         Radiology Studies: CT ABDOMEN PELVIS W CONTRAST  Result Date: 10/29/2022 CLINICAL DATA:  Abdominal pain. Diffuse abdominal tenderness with severe diarrhea. EXAM: CT ABDOMEN AND PELVIS WITH CONTRAST TECHNIQUE: Multidetector CT imaging of the abdomen and pelvis was performed using the standard protocol following bolus administration of intravenous contrast. RADIATION DOSE REDUCTION: This exam was performed according to the departmental dose-optimization program which includes automated exposure control, adjustment of the mA and/or kV according to patient size and/or use of iterative reconstruction technique. CONTRAST:  28m OMNIPAQUE IOHEXOL 300 MG/ML  SOLN COMPARISON:  08/07/2022. FINDINGS: Lower chest: Atelectasis are present at the lung bases. There is a 3 mm subpleural nodule in the right lower lobe, axial image 19. Hepatobiliary: No focal liver abnormality is seen. Status post cholecystectomy. No biliary dilatation. Pancreas: Unremarkable. No pancreatic ductal dilatation or surrounding inflammatory changes. Spleen: Normal in size without focal abnormality. Adrenals/Urinary Tract: The adrenal glands are within normal limits. Cysts are present in the kidneys bilaterally. No renal calculus or hydronephrosis. The bladder is not seen due to streak hardware artifact. Stomach/Bowel:  There is a small hiatal hernia. Stomach is within normal limits. Appendix appears normal. No evidence of bowel wall  thickening, distention, or inflammatory changes. No free air or pneumatosis. Air-fluid levels are present in the colon, compatible with history of diarrhea. Vascular/Lymphatic: Aortic atherosclerosis. No enlarged abdominal or pelvic lymph nodes. Reproductive: Status post hysterectomy. No adnexal masses. Other: No abdominopelvic ascites. Multifocal fat containing hernias are noted in the anterior abdominal wall in the midline. Musculoskeletal: Total hip arthroplasty changes are noted bilaterally. Degenerative changes are present in the thoracolumbar spine. No acute osseous abnormality. IMPRESSION: 1. No evidence of bowel obstruction or free air. 2. Air-fluid levels in the colon, compatible with history of diarrhea. 3. Small hiatal hernia. 4. Aortic atherosclerosis. 5. 3 mm subpleural nodule in the right lower lobe. No follow-up needed if patient is low-risk.This recommendation follows the consensus statement: Guidelines for Management of Incidental Pulmonary Nodules Detected on CT Images: From the Fleischner Society 2017; Radiology 2017; 284:228-243. Electronically Signed   By: Brett Fairy M.D.   On: 10/29/2022 21:49        Scheduled Meds:  acyclovir  400 mg Oral Daily   Chlorhexidine Gluconate Cloth  6 each Topical Daily   losartan  100 mg Oral Daily   pantoprazole  40 mg Oral Daily   saccharomyces boulardii  250 mg Oral BID WC   Continuous Infusions:  0.9 % NaCl with KCl 20 mEq / L 75 mL/hr at 10/30/22 0142   cefTRIAXone (ROCEPHIN)  IV 1 g (10/29/22 1055)     LOS: 0 days    Time spent:40 min    Anneke Cundy, Geraldo Docker, MD [facility]   If 7PM-7AM, please contact night-coverage 10/30/2022, 7:06 AM

## 2022-10-30 NOTE — Assessment & Plan Note (Signed)
Diagnosed 06/2022 Follows with Dr. Irene Limbo Has been receiving CHOEP every 21 days, last infusion of chemo was on 2/6

## 2022-10-30 NOTE — Assessment & Plan Note (Addendum)
Recent urinalysis and urine culture obtained on 2/8 found to be growing out Klebsiella pneumoniae Patient has been placed on intravenous ceftriaxone, will likely treat for 5 days

## 2022-10-30 NOTE — Assessment & Plan Note (Signed)
Persisting hypokalemia in the setting of diarrhea Replacing with several doses of potassium chloride Monitoring with serial chemistries

## 2022-10-30 NOTE — Assessment & Plan Note (Signed)
Notable pancytopenia in the setting of last chemotherapy infusion on 2/6 Monitoring counts with serial chemistries Supportive care for now Obtain type and screen Serial CBCs with differentials Adding Dr. Irene Limbo to the main team so that he is aware of the hospitalization and can provide insight as necessary.

## 2022-10-31 DIAGNOSIS — E876 Hypokalemia: Secondary | ICD-10-CM | POA: Diagnosis not present

## 2022-10-31 DIAGNOSIS — A09 Infectious gastroenteritis and colitis, unspecified: Secondary | ICD-10-CM | POA: Diagnosis not present

## 2022-10-31 DIAGNOSIS — I10 Essential (primary) hypertension: Secondary | ICD-10-CM | POA: Diagnosis not present

## 2022-10-31 DIAGNOSIS — E860 Dehydration: Secondary | ICD-10-CM | POA: Diagnosis not present

## 2022-10-31 LAB — CBC WITH DIFFERENTIAL/PLATELET
Abs Immature Granulocytes: 0.29 10*3/uL — ABNORMAL HIGH (ref 0.00–0.07)
Basophils Absolute: 0.1 10*3/uL (ref 0.0–0.1)
Basophils Relative: 2 %
Eosinophils Absolute: 0.1 10*3/uL (ref 0.0–0.5)
Eosinophils Relative: 1 %
HCT: 26.8 % — ABNORMAL LOW (ref 36.0–46.0)
Hemoglobin: 8.1 g/dL — ABNORMAL LOW (ref 12.0–15.0)
Immature Granulocytes: 5 %
Lymphocytes Relative: 8 %
Lymphs Abs: 0.4 10*3/uL — ABNORMAL LOW (ref 0.7–4.0)
MCH: 27.8 pg (ref 26.0–34.0)
MCHC: 30.2 g/dL (ref 30.0–36.0)
MCV: 92.1 fL (ref 80.0–100.0)
Monocytes Absolute: 1.1 10*3/uL — ABNORMAL HIGH (ref 0.1–1.0)
Monocytes Relative: 21 %
Neutro Abs: 3.4 10*3/uL (ref 1.7–7.7)
Neutrophils Relative %: 63 %
Platelets: 100 10*3/uL — ABNORMAL LOW (ref 150–400)
RBC: 2.91 MIL/uL — ABNORMAL LOW (ref 3.87–5.11)
RDW: 15.9 % — ABNORMAL HIGH (ref 11.5–15.5)
WBC: 5.3 10*3/uL (ref 4.0–10.5)
nRBC: 0.6 % — ABNORMAL HIGH (ref 0.0–0.2)

## 2022-10-31 LAB — COMPREHENSIVE METABOLIC PANEL
ALT: 55 U/L — ABNORMAL HIGH (ref 0–44)
AST: 17 U/L (ref 15–41)
Albumin: 2.8 g/dL — ABNORMAL LOW (ref 3.5–5.0)
Alkaline Phosphatase: 77 U/L (ref 38–126)
Anion gap: 5 (ref 5–15)
BUN: <5 mg/dL — ABNORMAL LOW (ref 8–23)
CO2: 17 mmol/L — ABNORMAL LOW (ref 22–32)
Calcium: 8.2 mg/dL — ABNORMAL LOW (ref 8.9–10.3)
Chloride: 116 mmol/L — ABNORMAL HIGH (ref 98–111)
Creatinine, Ser: 0.79 mg/dL (ref 0.44–1.00)
GFR, Estimated: >60 mL/min (ref >60)
Glucose, Bld: 98 mg/dL (ref 70–99)
Potassium: 3.7 mmol/L (ref 3.5–5.1)
Sodium: 138 mmol/L (ref 135–145)
Total Bilirubin: 0.5 mg/dL (ref 0.3–1.2)
Total Protein: 5.3 g/dL — ABNORMAL LOW (ref 6.5–8.1)

## 2022-10-31 LAB — PHOSPHORUS: Phosphorus: 2.4 mg/dL — ABNORMAL LOW (ref 2.5–4.6)

## 2022-10-31 LAB — LACTOFERRIN, FECAL, QUALITATIVE: Lactoferrin, Fecal, Qual: NEGATIVE

## 2022-10-31 LAB — MAGNESIUM: Magnesium: 1.9 mg/dL (ref 1.7–2.4)

## 2022-10-31 LAB — TROPONIN I (HIGH SENSITIVITY): Troponin I (High Sensitivity): 3 ng/L (ref <18)

## 2022-10-31 MED ORDER — SODIUM PHOSPHATES 45 MMOLE/15ML IV SOLN
30.0000 mmol | Freq: Once | INTRAVENOUS | Status: AC
  Administered 2022-10-31: 30 mmol via INTRAVENOUS
  Filled 2022-10-31: qty 10

## 2022-10-31 MED ORDER — OCTREOTIDE ACETATE 100 MCG/ML IJ SOLN
100.0000 ug | Freq: Three times a day (TID) | INTRAMUSCULAR | Status: DC
  Administered 2022-10-31 – 2022-11-05 (×15): 100 ug via SUBCUTANEOUS
  Filled 2022-10-31 (×17): qty 1

## 2022-10-31 MED ORDER — SODIUM BICARBONATE 8.4 % IV SOLN
100.0000 meq | Freq: Once | INTRAVENOUS | Status: AC
  Filled 2022-10-31: qty 50

## 2022-10-31 MED ORDER — SODIUM BICARBONATE 8.4 % IV SOLN
INTRAVENOUS | Status: AC
  Administered 2022-10-31: 100 meq via INTRAVENOUS
  Filled 2022-10-31: qty 50

## 2022-10-31 MED ORDER — MAGNESIUM SULFATE 2 GM/50ML IV SOLN
2.0000 g | Freq: Once | INTRAVENOUS | Status: AC
  Administered 2022-10-31: 2 g via INTRAVENOUS
  Filled 2022-10-31: qty 50

## 2022-10-31 NOTE — Progress Notes (Signed)
Patient had no complaints of any chest pain/discomfort throughout this shift. Resting in bed, no acute distress noted.

## 2022-10-31 NOTE — Progress Notes (Signed)
PROGRESS NOTE    Cynthia Velasquez  [id number] DOB: 1951/05/16 DOA: 10/28/2022 PCP: Kathyrn Lass, MD     Brief Narrative:  72 y.o. BF PMHx Anal cancer (Dx 06/2019), anxiety, depression, osteoarthritis, LEFT  breast cancer (Stage Iia, Dx 08/2013), gallstones, GERD, hiatal hernia, migraine headaches, hyperlipidemia, hypertension, peripheral neuropathy, prediabetes who was sent from the cancer center due to diarrhea for the past 3 weeks.   Upon evaluation at the cancer center on 2/9 for her ongoing symptoms, stool studies were obtained and came back positive for Sapovirus.  Due to patient's severe unrelenting symptoms patient presented to [facility] emergency department for evaluation from the cancer center.   Upon evaluation in the cancer center patient was found to be hypokalemic and volume depleted.  The hospitalist group was called to assess the patient for admission to the hospital.   Patient was also identified to have Klebsiella pneumoniae urinary tract infection based on urine culture from 2/8. NOTE patient last Colonoscopy 12/07/2021  Subjective: 2/15 afebrile overnight, positive continued diarrhea watery, x 5 today.  Negative blood at patient could detect.  .    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 how long would you treat for this virus? -2/14 stool osmotic gap: Stool Osm, (stool NA /K) pending - 2/14 fecal fat pending - 2/14 fecal lactoferrin negative - 2/14 occult blood pending -2/14 TSH pending - 2/14 n.p.o. -2/14 normal saline+ KCl 20 meq/L  156m/hr -Lomotil 2.5-0.025 mg - 2/14 increase Florastor 250 mg TID -2/14 if unable to control diarrhea/diagnose additional cause consult GI colonoscopy? -2/15 Octreotide 1062m SQ TID -2/15 unable to obtain Dx/control of watery diarrhea in next 48 hours will consult GI, request colonoscopy. Dr. KaIrene Limboncology concurs -2/15 discuss case with Dr. VaTommy MedalD and Acyclovir would have no effect on Sapovirus, okay to discontinue.Dr. KaIrene Limboncology concurs  None anion gap metabolic acidosis - 2/123456x amp sodium bicarb  Positive Klebsiella UTI -2/14 complete 5-day course antibiotics.  May have to extend given patient's immunosuppression -2/15 discussed case with Dr. VaTommy MedalD cleared to stop antibiotic after 3 days, unless symptomatic.  Pancytopenia -Secondary to chemotherapy.  See lymphoma  [place]Pt seen by Dr KaNeoma Lamingncology.  Last seen on 2/9 -Per oncology note 2/9  Last treatment:  Day 2   Cycle 6 on 10/23/22 -Last day of etopside was held 2/2 diarrhea so patient has completed course of chemo.  -2/14 will consult oncology in a.m. given that patient has completed chemo 7 days ago could still be having effect on her having diarrhea? -2/15 discussed case at length with Dr. KaIrene Limboncology who stated given that patient had XRT 2023+ chemotherapy could be causing/contributing to patient's diarrhea.  Chest pain -2/14 most likely secondary to patient's significant electrolyte abnormalities - 2/14  when compared to 10/28/22 no Significant change -2/14 trend troponin  Latest Reference Range & Units 10/30/22 17:57 10/31/22 10:18  Troponin I (High Sensitivity) <18 ng/L 3 3  -2/15 resolved most likely secondary to patient's significant electrolyte abnormality  Essential hypertension -Losartan 100 mg daily    GERD without esophagitis  Hypokalemia - Potassium goal>4 -2/14 see diarrhea -2/15 sodium Phosphate 30  mmol  Hypophosphatemia -Phosphorus goal>2.5  Hypomagnesmia -Magnesium goal>2 - 2/15 magnesium IV 2 g  Obesity (BMI 34.6 kg/m.) -Discussed with PCP        Mobility Assessment (last 72 hours)     Mobility Assessment     Row Name 10/30/22 2100 10/30/22 0800 10/29/22 2249 10/29/22 1304 10/28/22 2117   Does patient have an order for bedrest or is patient medically unstable No - Continue assessment No - Continue assessment No - Continue assessment No - Continue assessment No - Continue assessment   What is the highest level of mobility based on the progressive mobility assessment? Level 5 (Walks with assist in room/hall) - Balance while stepping forward/back and can walk in room with assist - Complete Level 5 (Walks with assist in room/hall) - Balance while stepping forward/back and can walk in room with assist - Complete Level 5 (Walks with assist in room/hall) - Balance while stepping forward/back and can walk in room with assist - Complete Level 5 (Walks with assist in room/hall) - Balance while stepping forward/back and can walk in room with assist - Complete Level 5 (Walks with assist in room/hall) - Balance while stepping forward/back and can walk in room with assist - Complete                  DVT prophylaxis:  Code Status:  Family Communication:  Status is: Inpatient    Dispo: The patient is from: Home              Anticipated d/c is to: Home              Anticipated d/c date is: > 3 days              Patient currently is not medically stable to d/c.      Consultants:    Procedures/Significant Events:  2/13 CT Abd and Pelvis w/contrast; No evidence of bowel obstruction or free air. 2. Air-fluid levels in the colon, compatible with history of diarrhea. 3. Small hiatal hernia. 4. Aortic atherosclerosis. 5. 3 mm subpleural nodule in the right lower lobe. No follow-up needed if patient is low-risk.    I have personally reviewed and interpreted all  radiology studies and my findings are as above.  VENTILATOR SETTINGS:    Cultures 2/8 stool positive Sapovirus 2/8 urine positive Klebsiella    Antimicrobials: Anti-infectives (From admission, onward)    Start     Dose/Rate Route Frequency Ordered Stop   10/29/22 1100  cefTRIAXone (ROCEPHIN) 1 g in sodium chloride 0.9 % 100 mL IVPB        1 g 200 mL/hr over 30 Minutes Intravenous Every 24 hours 10/29/22 0938 11/02/22 2359   10/28/22 1915  acyclovir (ZOVIRAX) tablet 400 mg  Status:  Discontinued        400 mg Oral Daily 10/28/22 1828 10/31/22 1517           Devices    LINES / TUBES:      Continuous Infusions:  cefTRIAXone (ROCEPHIN)  IV 200 mL/hr at 10/31/22 0454     Objective: Vitals:   10/30/22 1327 10/30/22 1720 10/30/22 2107 10/31/22 0454  BP: 126/76 123/73 109/66 124/74  Pulse: 80 74 84 85  Resp: 18 18 17 16  $ Temp: 98.2 F (36.8 C)  98.5 F (36.9 C) 98.7 F (37.1 C)  TempSrc: Oral  Oral Oral  SpO2: 100% 100% 100% 100%  Weight:      Height:        Intake/Output Summary (Last 24 hours) at 10/31/2022 [id number] Last data filed at 10/31/2022 0456 Gross per 24 hour  Intake 5257.67 ml  Output 400 ml  Net 4857.67 ml    Filed Weights   10/28/22 1200 10/28/22 1225  Weight: 94.3 kg 94.3 kg    Physical Exam:  General: A/O x 4, No acute respiratory distress Eyes: negative scleral hemorrhage, negative anisocoria, negative icterus ENT: Negative Runny nose, negative gingival bleeding, Neck:  Negative scars, masses, torticollis, lymphadenopathy, JVD Lungs: Clear to auscultation bilaterally without wheezes or crackles Cardiovascular: Regular rate and rhythm without murmur gallop or rub normal S1 and S2 Abdomen: Positive mild abdominal pain to palpation RUQ/RLL, nondistended, positive soft, bowel sounds, no rebound, no ascites, no appreciable mass Extremities: No significant cyanosis, clubbing, or edema bilateral lower extremities Skin: Negative rashes,  lesions, ulcers Psychiatric:  Negative depression, negative anxiety, negative fatigue, negative mania  Central nervous system:  Cranial nerves II through XII intact, tongue/uvula midline, all extremities muscle strength 5/5, sensation intact throughout,negative dysarthria, negative expressive aphasia, negative receptive aphasia.  .     Data Reviewed: Care during the described time interval was provided by me .  I have reviewed this patient's available data, including medical history, events of note, physical examination, and all test results as part of my evaluation.  CBC: Recent Labs  Lab 10/24/22 1345 10/25/22 1120 10/28/22 1249 10/29/22 0430 10/30/22 0650 10/30/22 1037 10/31/22 0435  WBC 3.8* 24.7* 4.7 2.8* 2.8* 3.0* 5.3  NEUTROABS 2.9 24.5*  --   --  1.2* 1.1* 3.4  HGB 9.3* 10.4* 10.5* 8.4* 8.4* 8.5* 8.1*  HCT 28.3* 32.2* 34.4* 27.7* 27.7* 27.7* 26.8*  MCV 86.5 88.2 92.0 93.0 93.6 93.0 92.1  PLT 260 264 149* 129* 101* 101* 100*    Basic Metabolic Panel: Recent Labs  Lab 10/25/22 1120 10/28/22 1249 10/29/22 0430 10/30/22 0650 10/30/22 1037 10/31/22 0435  NA 138 140 139 139 139 138  K 3.0* 2.5* 3.1* 3.4* 3.4* 3.7  CL 110 117* 118* 118* 119* 116*  CO2 20* 14* 15* 15* 16* 17*  GLUCOSE 90 120* 96 93 90 98  BUN 16 13 11 $ <5* <5* <5*  CREATININE 0.81 1.13* 0.92 0.82 0.82 0.79  CALCIUM 8.6* 8.7* 8.0* 8.4* 8.4* 8.2*  MG 1.4* 1.8  --  1.4* 1.4* 1.9  PHOS  --  2.0*  --  2.1* 2.0* 2.4*    GFR: Estimated Creatinine Clearance: 73.2 mL/min (by C-G formula based on SCr of 0.79 mg/dL). Liver Function Tests: Recent Labs  Lab 10/28/22 1249 10/29/22 0430 10/30/22 0650 10/30/22 1037 10/31/22 0435  AST 56* 31 21 21 17  $ ALT 142* 99* 74* 70* 55*  ALKPHOS 93 80 76 72 77  BILITOT 0.6 0.4 0.5 0.5 0.5  PROT 7.0 5.7* 5.8* 5.7* 5.3*  ALBUMIN 3.4* 2.9* 2.7* 2.7* 2.8*    Recent Labs  Lab 10/28/22 1249  LIPASE 29    No results for input(s): "AMMONIA" in the last 168  hours. Coagulation Profile: No results for input(s): "INR", "PROTIME" in the last 168 hours. Cardiac Enzymes: No results for input(s): "CKTOTAL", "CKMB", "CKMBINDEX", "TROPONINI" in the last 168 hours. BNP (last 3 results) No results for input(s): "PROBNP" in the last 8760 hours. HbA1C: No results for input(s): "HGBA1C" in the last 72 hours. CBG: No results for input(s): "GLUCAP" in  the last 168 hours. Lipid Profile: No results for input(s): "CHOL", "HDL", "LDLCALC", "TRIG", "CHOLHDL", "LDLDIRECT" in the last 72 hours. Thyroid Function Tests: No results for input(s): "TSH", "T4TOTAL", "FREET4", "T3FREE", "THYROIDAB" in the last 72 hours. Anemia Panel: No results for input(s): "VITAMINB12", "FOLATE", "FERRITIN", "TIBC", "IRON", "RETICCTPCT" in the last 72 hours. Sepsis Labs: No results for input(s): "PROCALCITON", "LATICACIDVEN" in the last 168 hours.  Recent Results (from the past 240 hour(s))  Culture, Urine     Status: Abnormal   Collection Time: 10/24/22  2:40 PM   Specimen: Urine, Clean Catch  Result Value Ref Range Status   Specimen Description   Final    URINE, CLEAN CATCH Performed at [facility], [address] [place], [place] [address]    Special Requests   Final    NONE Performed at [facility], [place] [address], [place], [place] [address]    Culture 60,000 COLONIES/mL KLEBSIELLA PNEUMONIAE (A)  Final   Report Status 10/26/2022 FINAL  Final   Organism ID, Bacteria KLEBSIELLA PNEUMONIAE (A)  Final      Susceptibility   Klebsiella pneumoniae - MIC*    AMPICILLIN RESISTANT Resistant     CEFEPIME <=0.12 SENSITIVE Sensitive     CEFTRIAXONE <=0.25 SENSITIVE Sensitive     CIPROFLOXACIN <=0.25 SENSITIVE Sensitive     GENTAMICIN <=1 SENSITIVE Sensitive     IMIPENEM <=0.25 SENSITIVE Sensitive     NITROFURANTOIN 32 SENSITIVE Sensitive     TRIMETH/SULFA <=20 SENSITIVE Sensitive     AMPICILLIN/SULBACTAM <=2 SENSITIVE  Sensitive     PIP/TAZO <=4 SENSITIVE Sensitive     * 60,000 COLONIES/mL KLEBSIELLA PNEUMONIAE  C difficile quick screen w PCR reflex     Status: None   Collection Time: 10/24/22  3:35 PM   Specimen: STOOL  Result Value Ref Range Status   C Diff antigen NEGATIVE NEGATIVE Final   C Diff toxin NEGATIVE NEGATIVE Final   C Diff interpretation No C. difficile detected.  Final    Comment: Performed at [facility], [place] [address], [place], [place] [address]  GI pathogen panel by PCR, stool     Status: Abnormal   Collection Time: 10/24/22  3:35 PM   Specimen: Stool  Result Value Ref Range Status   Plesiomonas shigelloides NOT DETECTED NOT DETECTED Final   Yersinia enterocolitica NOT DETECTED NOT DETECTED Final   Vibrio NOT DETECTED NOT DETECTED Final   Enteropathogenic E coli NOT DETECTED NOT DETECTED Final   E coli (ETEC) LT/ST NOT DETECTED NOT DETECTED Final   E coli [id number] by PCR Not applicable NOT DETECTED Final   Cryptosporidium by PCR NOT DETECTED NOT DETECTED Final   Entamoeba histolytica NOT DETECTED NOT DETECTED Final   Adenovirus F 40/41 NOT DETECTED NOT DETECTED Final   Norovirus GI/GII NOT DETECTED NOT DETECTED Final   Sapovirus DETECTED (A) NOT DETECTED Final    Comment: (NOTE) Performed At: [facility] [place], [place] [id number] Rush Farmer MD [id number]    Vibrio cholerae NOT DETECTED NOT DETECTED Final   Campylobacter by PCR NOT DETECTED NOT DETECTED Final   Salmonella by PCR NOT DETECTED NOT DETECTED Final   E coli (STEC) NOT DETECTED NOT DETECTED Final   Enteroaggregative E coli NOT DETECTED NOT DETECTED Final   Shigella by PCR NOT DETECTED NOT DETECTED Final   Cyclospora cayetanensis NOT DETECTED NOT DETECTED Final   Astrovirus NOT DETECTED NOT DETECTED Final   G lamblia  by PCR NOT DETECTED NOT DETECTED Final   Rotavirus A by PCR NOT DETECTED NOT DETECTED Final         Radiology Studies: DG CHEST PORT 1  VIEW  Result Date: 10/30/2022 CLINICAL DATA:  Chest pain. EXAM: PORTABLE CHEST 1 VIEW COMPARISON:  April 07, 2022. FINDINGS: The heart size and mediastinal contours are within normal limits. Both lungs are clear. Right internal jugular Port-A-Cath is noted with tip in expected position of cavoatrial junction. Status post right shoulder arthroplasty. IMPRESSION: No active disease. Electronically Signed   By: Marijo Conception M.D.   On: 10/30/2022 17:50   CT ABDOMEN PELVIS W CONTRAST  Result Date: 10/29/2022 CLINICAL DATA:  Abdominal pain. Diffuse abdominal tenderness with severe diarrhea. EXAM: CT ABDOMEN AND PELVIS WITH CONTRAST TECHNIQUE: Multidetector CT imaging of the abdomen and pelvis was performed using the standard protocol following bolus administration of intravenous contrast. RADIATION DOSE REDUCTION: This exam was performed according to the departmental dose-optimization program which includes automated exposure control, adjustment of the mA and/or kV according to patient size and/or use of iterative reconstruction technique. CONTRAST:  20m OMNIPAQUE IOHEXOL 300 MG/ML  SOLN COMPARISON:  08/07/2022. FINDINGS: Lower chest: Atelectasis are present at the lung bases. There is a 3 mm subpleural nodule in the right lower lobe, axial image 19. Hepatobiliary: No focal liver abnormality is seen. Status post cholecystectomy. No biliary dilatation. Pancreas: Unremarkable. No pancreatic ductal dilatation or surrounding inflammatory changes. Spleen: Normal in size without focal abnormality. Adrenals/Urinary Tract: The adrenal glands are within normal limits. Cysts are present in the kidneys bilaterally. No renal calculus or hydronephrosis. The bladder is not seen due to streak hardware artifact. Stomach/Bowel: There is a small hiatal hernia. Stomach is within normal limits. Appendix appears normal. No evidence of bowel wall thickening, distention, or inflammatory changes. No free air or pneumatosis. Air-fluid  levels are present in the colon, compatible with history of diarrhea. Vascular/Lymphatic: Aortic atherosclerosis. No enlarged abdominal or pelvic lymph nodes. Reproductive: Status post hysterectomy. No adnexal masses. Other: No abdominopelvic ascites. Multifocal fat containing hernias are noted in the anterior abdominal wall in the midline. Musculoskeletal: Total hip arthroplasty changes are noted bilaterally. Degenerative changes are present in the thoracolumbar spine. No acute osseous abnormality. IMPRESSION: 1. No evidence of bowel obstruction or free air. 2. Air-fluid levels in the colon, compatible with history of diarrhea. 3. Small hiatal hernia. 4. Aortic atherosclerosis. 5. 3 mm subpleural nodule in the right lower lobe. No follow-up needed if patient is low-risk.This recommendation follows the consensus statement: Guidelines for Management of Incidental Pulmonary Nodules Detected on CT Images: From the Fleischner Society 2017; Radiology 2017; 284:228-243. Electronically Signed   By: LBrett FairyM.D.   On: 10/29/2022 21:49        Scheduled Meds:  acyclovir  400 mg Oral Daily   Chlorhexidine Gluconate Cloth  6 each Topical Daily   losartan  100 mg Oral Daily   pantoprazole  40 mg Oral Daily   saccharomyces boulardii  250 mg Oral TID   Continuous Infusions:  cefTRIAXone (ROCEPHIN)  IV 200 mL/hr at 10/31/22 0454     LOS: 1 day    Time spent:40 min    Sahaj Bona, CGeraldo Docker MD [facility]   If 7PM-7AM, please contact night-coverage 10/31/2022, 8:22 AM

## 2022-11-01 DIAGNOSIS — E876 Hypokalemia: Secondary | ICD-10-CM | POA: Diagnosis not present

## 2022-11-01 DIAGNOSIS — E860 Dehydration: Secondary | ICD-10-CM | POA: Diagnosis not present

## 2022-11-01 DIAGNOSIS — A09 Infectious gastroenteritis and colitis, unspecified: Secondary | ICD-10-CM | POA: Diagnosis not present

## 2022-11-01 DIAGNOSIS — I10 Essential (primary) hypertension: Secondary | ICD-10-CM | POA: Diagnosis not present

## 2022-11-01 LAB — CBC WITH DIFFERENTIAL/PLATELET
Abs Immature Granulocytes: 0.5 10*3/uL — ABNORMAL HIGH (ref 0.00–0.07)
Basophils Absolute: 0 10*3/uL (ref 0.0–0.1)
Basophils Relative: 0 %
Eosinophils Absolute: 0.1 10*3/uL (ref 0.0–0.5)
Eosinophils Relative: 1 %
HCT: 27.1 % — ABNORMAL LOW (ref 36.0–46.0)
Hemoglobin: 8.5 g/dL — ABNORMAL LOW (ref 12.0–15.0)
Immature Granulocytes: 7 %
Lymphocytes Relative: 7 %
Lymphs Abs: 0.5 10*3/uL — ABNORMAL LOW (ref 0.7–4.0)
MCH: 28.1 pg (ref 26.0–34.0)
MCHC: 31.4 g/dL (ref 30.0–36.0)
MCV: 89.7 fL (ref 80.0–100.0)
Monocytes Absolute: 1.2 10*3/uL — ABNORMAL HIGH (ref 0.1–1.0)
Monocytes Relative: 17 %
Neutro Abs: 4.7 10*3/uL (ref 1.7–7.7)
Neutrophils Relative %: 68 %
Platelets: 117 10*3/uL — ABNORMAL LOW (ref 150–400)
RBC: 3.02 MIL/uL — ABNORMAL LOW (ref 3.87–5.11)
RDW: 16.1 % — ABNORMAL HIGH (ref 11.5–15.5)
WBC: 7 10*3/uL (ref 4.0–10.5)
nRBC: 0.4 % — ABNORMAL HIGH (ref 0.0–0.2)

## 2022-11-01 LAB — COMPREHENSIVE METABOLIC PANEL
ALT: 43 U/L (ref 0–44)
AST: 17 U/L (ref 15–41)
Albumin: 2.6 g/dL — ABNORMAL LOW (ref 3.5–5.0)
Alkaline Phosphatase: 84 U/L (ref 38–126)
Anion gap: 7 (ref 5–15)
BUN: <5 mg/dL — ABNORMAL LOW (ref 8–23)
CO2: 23 mmol/L (ref 22–32)
Calcium: 8 mg/dL — ABNORMAL LOW (ref 8.9–10.3)
Chloride: 109 mmol/L (ref 98–111)
Creatinine, Ser: 0.94 mg/dL (ref 0.44–1.00)
GFR, Estimated: >60 mL/min (ref >60)
Glucose, Bld: 136 mg/dL — ABNORMAL HIGH (ref 70–99)
Potassium: 3.2 mmol/L — ABNORMAL LOW (ref 3.5–5.1)
Sodium: 139 mmol/L (ref 135–145)
Total Bilirubin: 0.4 mg/dL (ref 0.3–1.2)
Total Protein: 5.2 g/dL — ABNORMAL LOW (ref 6.5–8.1)

## 2022-11-01 LAB — MAGNESIUM: Magnesium: 2.3 mg/dL (ref 1.7–2.4)

## 2022-11-01 LAB — FECAL FAT, QUALITATIVE
Fat Qual Neutral, Stl: NORMAL
Fat Qual Total, Stl: NORMAL

## 2022-11-01 LAB — PHOSPHORUS: Phosphorus: 4.4 mg/dL (ref 2.5–4.6)

## 2022-11-01 MED ORDER — POTASSIUM CHLORIDE 10 MEQ/100ML IV SOLN
10.0000 meq | INTRAVENOUS | Status: AC
  Administered 2022-11-01 – 2022-11-02 (×6): 10 meq via INTRAVENOUS
  Filled 2022-11-01 (×6): qty 100

## 2022-11-01 MED ORDER — HEPARIN SODIUM (PORCINE) 5000 UNIT/ML IJ SOLN
5000.0000 [IU] | Freq: Three times a day (TID) | INTRAMUSCULAR | Status: DC
  Administered 2022-11-01 – 2022-11-05 (×12): 5000 [IU] via SUBCUTANEOUS
  Filled 2022-11-01 (×11): qty 1

## 2022-11-01 NOTE — Progress Notes (Signed)
Mobility Specialist - Progress Note   11/01/22 1049  Mobility  Activity Ambulated with assistance in hallway  Level of Assistance Modified independent, requires aide device or extra time  Assistive Device Front wheel walker  Distance Ambulated (ft) 450 ft  Activity Response Tolerated well  Mobility Referral Yes  $Mobility charge 1 Mobility   Pt received in room standing and agreeable to mobility. No complaints during session. Pt to room standing after session with all needs met.    [facility]

## 2022-11-01 NOTE — Progress Notes (Signed)
PROGRESS NOTE    Cynthia Velasquez  [id number] DOB: 03-03-1951 DOA: 10/28/2022 PCP: Kathyrn Lass, MD     Brief Narrative:  72 y.o. BF PMHx Anal cancer (Dx 06/2019), anxiety, depression, osteoarthritis, LEFT  breast cancer (Stage Iia, Dx 08/2013), gallstones, GERD, hiatal hernia, migraine headaches, hyperlipidemia, hypertension, peripheral neuropathy, prediabetes who was sent from the cancer center due to diarrhea for the past 3 weeks.   Upon evaluation at the cancer center on 2/9 for her ongoing symptoms, stool studies were obtained and came back positive for Sapovirus.  Due to patient's severe unrelenting symptoms patient presented to [facility] emergency department for evaluation from the cancer center.   Upon evaluation in the cancer center patient was found to be hypokalemic and volume depleted.  The hospitalist group was called to assess the patient for admission to the hospital.   Patient was also identified to have Klebsiella pneumoniae urinary tract infection based on urine culture from 2/8. NOTE patient last Colonoscopy 12/07/2021  Subjective: 2/16 A/O x 4.  Patient states diarrhea has resolved.  Would like to advance her diet.   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 how long would you treat for this virus? -2/14 stool osmotic gap: Stool Osm, (stool NA /K) pending - 2/14 fecal fat pending - 2/14 fecal lactoferrin negative - 2/14 occult blood pending -2/14 TSH pending - 2/14 n.p.o. -2/14 normal saline+ KCl 20 meq/L 14m/hr -Lomotil 2.5-0.025 mg - 2/14  increase Florastor 250 mg TID -2/14 if unable to control diarrhea/diagnose additional cause consult GI colonoscopy? -2/15 Octreotide 1067m SQ TID -2/15 unable to obtain Dx/control of watery diarrhea in next 48 hours will consult GI, request colonoscopy. Dr. KaIrene Limboncology concurs -2/15 discuss case with Dr. VaTommy MedalD and Acyclovir would have no effect on Sapovirus, okay to discontinue.Dr. KaIrene Limboncology concurs -2/16 patient without diarrhea overnight and abdomen no longer tender.  Advance diet to full liquid.  None anion gap metabolic acidosis - 2/123456x amp sodium bicarb  Positive Klebsiella UTI -2/14 complete 5-day course antibiotics.  May have to extend given patient's immunosuppression -2/15 discussed case with Dr. VaTommy MedalD cleared to stop antibiotic after 3 days, unless symptomatic.  Pancytopenia -Secondary to chemotherapy.  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  Last seen on 2/9 -Per oncology note 2/9  Last treatment:  Day 2   Cycle 6 on 10/23/22 -Last day of etopside was held 2/2 diarrhea so patient has completed course of chemo.  -2/14 will consult oncology in a.m. given that patient has completed chemo 7 days ago could still be having effect on her having diarrhea? -2/15 discussed case at length with Dr. KaIrene Limboncology who stated given that patient had XRT 2023+ chemotherapy could be causing/contributing to patient's diarrhea.  Chest pain -2/14 most likely secondary to patient's significant electrolyte abnormalities - 2/14  when compared to 10/28/22 no Significant change -2/14 trend troponin  Latest Reference Range & Units 10/30/22 17:57 10/31/22 10:18  Troponin I (High Sensitivity) <18 ng/L 3  3  -2/15 resolved most likely secondary to patient's significant electrolyte abnormality  Essential hypertension -Losartan 100 mg daily    GERD without esophagitis  Hypokalemia - Potassium  goal>4 -2/14 see diarrhea -2/15 sodium Phosphate 30 mmol -2/16 Potassium IV 60 mEq  Hypophosphatemia -Phosphorus goal>2.5  Hypomagnesmia -Magnesium goal>2 - 2/15 magnesium IV 2 g  Obesity (BMI 34.6 kg/m.) -Discussed with PCP        Mobility Assessment (last 72 hours)     Mobility Assessment     Row Name 11/01/22 1300 10/31/22 2135 10/30/22 2100 10/30/22 0800 10/29/22 2249   Does patient have an order for bedrest or is patient medically unstable No - Continue assessment No - Continue assessment No - Continue assessment No - Continue assessment No - Continue assessment   What is the highest level of mobility based on the progressive mobility assessment? -- Level 5 (Walks with assist in room/hall) - Balance while stepping forward/back and can walk in room with assist - Complete Level 5 (Walks with assist in room/hall) - Balance while stepping forward/back and can walk in room with assist - Complete Level 5 (Walks with assist in room/hall) - Balance while stepping forward/back and can walk in room with assist - Complete Level 5 (Walks with assist in room/hall) - Balance while stepping forward/back and can walk in room with assist - Complete                  DVT prophylaxis: Subcu heparin Code Status:  Family Communication:  Status is: Inpatient    Dispo: The patient is from: Home              Anticipated d/c is to: Home              Anticipated d/c date is: > 3 days              Patient currently is not medically stable to d/c.      Consultants:  Dr. Irene Limbo oncology   Procedures/Significant Events:  2/13 CT Abd and Pelvis w/contrast; No evidence of bowel obstruction or free air. 2. Air-fluid levels in the colon, compatible with history of diarrhea. 3. Small hiatal hernia. 4. Aortic atherosclerosis. 5. 3 mm subpleural nodule in the right lower lobe. No follow-up needed if patient is low-risk.    I have personally reviewed and interpreted all radiology  studies and my findings are as above.  VENTILATOR SETTINGS:    Cultures 2/8 stool positive Sapovirus 2/8 urine positive Klebsiella    Antimicrobials: Anti-infectives (From admission, onward)    Start     Ordered Stop   10/29/22 1100  cefTRIAXone (ROCEPHIN) 1 g in sodium chloride 0.9 % 100 mL IVPB        10/29/22 0938 11/02/22 2359   10/28/22 1915  acyclovir (ZOVIRAX) tablet 400 mg  Status:  Discontinued        10/28/22 1828 10/31/22 1517           Devices    LINES / TUBES:      Continuous Infusions:  cefTRIAXone (ROCEPHIN)  IV 1 g (11/01/22 1123)     Objective: Vitals:   10/31/22 0454 10/31/22 1935 11/01/22 0416 11/01/22 1250  BP: 124/74 120/74 118/72 118/71  Pulse: 85 93 78 87  Resp: 16 18 19 18  $ Temp: 98.7 F (37.1 C) 99.9 F (37.7 C) 98.4 F (36.9 C) 98.4 F (36.9 C)  TempSrc: Oral Oral Oral Oral  SpO2: 100% 96% 93% 97%  Weight:  Height:        Intake/Output Summary (Last 24 hours) at 11/01/2022 1800 Last data filed at 11/01/2022 1452 Gross per 24 hour  Intake 1255.28 ml  Output --  Net 1255.28 ml    Filed Weights   10/28/22 1200 10/28/22 1225  Weight: 94.3 kg 94.3 kg   Physical Exam:  General: A/O x 4, No acute respiratory distress Eyes: negative scleral hemorrhage, negative anisocoria, negative icterus ENT: Negative Runny nose, negative gingival bleeding, Neck:  Negative scars, masses, torticollis, lymphadenopathy, JVD Lungs: Clear to auscultation bilaterally without wheezes or crackles Cardiovascular: Regular rate and rhythm without murmur gallop or rub normal S1 and S2 Abdomen: negative abdominal pain, nondistended, positive soft, bowel sounds, no rebound, no ascites, no appreciable mass Extremities: No significant cyanosis, clubbing, or edema bilateral lower extremities Skin: Negative rashes, lesions, ulcers Psychiatric:  Negative depression, negative anxiety, negative fatigue, negative mania  Central nervous system:  Cranial  nerves II through XII intact, tongue/uvula midline, all extremities muscle strength 5/5, sensation intact throughout, negative dysarthria, negative expressive aphasia, negative receptive aphasia. .     Data Reviewed: Care during the described time interval was provided by me .  I have reviewed this patient's available data, including medical history, events of note, physical examination, and all test results as part of my evaluation.  CBC: Recent Labs  Lab 10/29/22 0430 10/30/22 0650 10/30/22 1037 10/31/22 0435 11/01/22 0441  WBC 2.8* 2.8* 3.0* 5.3 7.0  NEUTROABS  --  1.2* 1.1* 3.4 4.7  HGB 8.4* 8.4* 8.5* 8.1* 8.5*  HCT 27.7* 27.7* 27.7* 26.8* 27.1*  MCV 93.0 93.6 93.0 92.1 89.7  PLT 129* 101* 101* 100* 117*    Basic Metabolic Panel: Recent Labs  Lab 10/28/22 1249 10/29/22 0430 10/30/22 0650 10/30/22 1037 10/31/22 0435 11/01/22 0441  NA 140 139 139 139 138 139  K 2.5* 3.1* 3.4* 3.4* 3.7 3.2*  CL 117* 118* 118* 119* 116* 109  CO2 14* 15* 15* 16* 17* 23  GLUCOSE 120* 96 93 90 98 136*  BUN 13 11 <5* <5* <5* <5*  CREATININE 1.13* 0.92 0.82 0.82 0.79 0.94  CALCIUM 8.7* 8.0* 8.4* 8.4* 8.2* 8.0*  MG 1.8  --  1.4* 1.4* 1.9 2.3  PHOS 2.0*  --  2.1* 2.0* 2.4* 4.4    GFR: Estimated Creatinine Clearance: 62.3 mL/min (by C-G formula based on SCr of 0.94 mg/dL). Liver Function Tests: Recent Labs  Lab 10/29/22 0430 10/30/22 0650 10/30/22 1037 10/31/22 0435 11/01/22 0441  AST 31 21 21 17 17  $ ALT 99* 74* 70* 55* 43  ALKPHOS 80 76 72 77 84  BILITOT 0.4 0.5 0.5 0.5 0.4  PROT 5.7* 5.8* 5.7* 5.3* 5.2*  ALBUMIN 2.9* 2.7* 2.7* 2.8* 2.6*    Recent Labs  Lab 10/28/22 1249  LIPASE 29    No results for input(s): "AMMONIA" in the last 168 hours. Coagulation Profile: No results for input(s): "INR", "PROTIME" in the last 168 hours. Cardiac Enzymes: No results for input(s): "CKTOTAL", "CKMB", "CKMBINDEX", "TROPONINI" in the last 168 hours. BNP (last 3 results) No results  for input(s): "PROBNP" in the last 8760 hours. HbA1C: No results for input(s): "HGBA1C" in the last 72 hours. CBG: No results for input(s): "GLUCAP" in the last 168 hours. Lipid Profile: No results for input(s): "CHOL", "HDL", "LDLCALC", "TRIG", "CHOLHDL", "LDLDIRECT" in the last 72 hours. Thyroid Function Tests: No results for input(s): "TSH", "T4TOTAL", "FREET4", "T3FREE", "THYROIDAB" in the last 72 hours. Anemia Panel: No results for input(s): "  VITAMINB12", "FOLATE", "FERRITIN", "TIBC", "IRON", "RETICCTPCT" in the last 72 hours. Sepsis Labs: No results for input(s): "PROCALCITON", "LATICACIDVEN" in the last 168 hours.  Recent Results (from the past 240 hour(s))  Culture, Urine     Status: Abnormal   Collection Time: 10/24/22  2:40 PM   Specimen: Urine, Clean Catch  Result Value Ref Range Status   Specimen Description   Final    URINE, CLEAN CATCH Performed at [facility], [address] [place], [place] [address]    Special Requests   Final    NONE Performed at [facility], [place] [address], [place], [place] [address]    Culture 60,000 COLONIES/mL KLEBSIELLA PNEUMONIAE (A)  Final   Report Status 10/26/2022 FINAL  Final   Organism ID, Bacteria KLEBSIELLA PNEUMONIAE (A)  Final      Susceptibility   Klebsiella pneumoniae - MIC*    AMPICILLIN RESISTANT Resistant     CEFEPIME <=0.12 SENSITIVE Sensitive     CEFTRIAXONE <=0.25 SENSITIVE Sensitive     CIPROFLOXACIN <=0.25 SENSITIVE Sensitive     GENTAMICIN <=1 SENSITIVE Sensitive     IMIPENEM <=0.25 SENSITIVE Sensitive     NITROFURANTOIN 32 SENSITIVE Sensitive     TRIMETH/SULFA <=20 SENSITIVE Sensitive     AMPICILLIN/SULBACTAM <=2 SENSITIVE Sensitive     PIP/TAZO <=4 SENSITIVE Sensitive     * 60,000 COLONIES/mL KLEBSIELLA PNEUMONIAE  C difficile quick screen w PCR reflex     Status: None   Collection Time: 10/24/22  3:35 PM   Specimen: STOOL  Result Value Ref Range Status    C Diff antigen NEGATIVE NEGATIVE Final   C Diff toxin NEGATIVE NEGATIVE Final   C Diff interpretation No C. difficile detected.  Final    Comment: Performed at [facility], [place] [address] [place], [place] [address]  GI pathogen panel by PCR, stool     Status: Abnormal   Collection Time: 10/24/22  3:35 PM   Specimen: Stool  Result Value Ref Range Status   Plesiomonas shigelloides NOT DETECTED NOT DETECTED Final   Yersinia enterocolitica NOT DETECTED NOT DETECTED Final   Vibrio NOT DETECTED NOT DETECTED Final   Enteropathogenic E coli NOT DETECTED NOT DETECTED Final   E coli (ETEC) LT/ST NOT DETECTED NOT DETECTED Final   E coli [id number] by PCR Not applicable NOT DETECTED Final   Cryptosporidium by PCR NOT DETECTED NOT DETECTED Final   Entamoeba histolytica NOT DETECTED NOT DETECTED Final   Adenovirus F 40/41 NOT DETECTED NOT DETECTED Final   Norovirus GI/GII NOT DETECTED NOT DETECTED Final   Sapovirus DETECTED (A) NOT DETECTED Final    Comment: (NOTE) Performed At: [facility] [place], [place] [id number] Rush Farmer MD [id number]    Vibrio cholerae NOT DETECTED NOT DETECTED Final   Campylobacter by PCR NOT DETECTED NOT DETECTED Final   Salmonella by PCR NOT DETECTED NOT DETECTED Final   E coli (STEC) NOT DETECTED NOT DETECTED Final   Enteroaggregative E coli NOT DETECTED NOT DETECTED Final   Shigella by PCR NOT DETECTED NOT DETECTED Final   Cyclospora cayetanensis NOT DETECTED NOT DETECTED Final   Astrovirus NOT DETECTED NOT DETECTED Final   G lamblia by PCR NOT DETECTED NOT DETECTED Final   Rotavirus A by PCR NOT DETECTED NOT DETECTED Final         Radiology Studies: No results found.      Scheduled Meds:  Chlorhexidine Gluconate Cloth  6  each Topical Daily   losartan  100 mg Oral Daily   octreotide  100 mcg Subcutaneous TID   pantoprazole  40 mg Oral Daily   saccharomyces boulardii  250 mg Oral TID   Continuous  Infusions:  cefTRIAXone (ROCEPHIN)  IV 1 g (11/01/22 1123)     LOS: 2 days    Time spent:40 min    Camron Essman, Geraldo Docker, MD [facility]   If 7PM-7AM, please contact night-coverage 11/01/2022, 6:00 PM

## 2022-11-02 DIAGNOSIS — A09 Infectious gastroenteritis and colitis, unspecified: Secondary | ICD-10-CM | POA: Diagnosis not present

## 2022-11-02 DIAGNOSIS — E876 Hypokalemia: Secondary | ICD-10-CM | POA: Diagnosis not present

## 2022-11-02 DIAGNOSIS — I10 Essential (primary) hypertension: Secondary | ICD-10-CM | POA: Diagnosis not present

## 2022-11-02 DIAGNOSIS — E860 Dehydration: Secondary | ICD-10-CM | POA: Diagnosis not present

## 2022-11-02 LAB — COMPREHENSIVE METABOLIC PANEL
ALT: 33 U/L (ref 0–44)
AST: 17 U/L (ref 15–41)
Albumin: 2.8 g/dL — ABNORMAL LOW (ref 3.5–5.0)
Alkaline Phosphatase: 90 U/L (ref 38–126)
Anion gap: 6 (ref 5–15)
BUN: <5 mg/dL — ABNORMAL LOW (ref 8–23)
CO2: 25 mmol/L (ref 22–32)
Calcium: 8.3 mg/dL — ABNORMAL LOW (ref 8.9–10.3)
Chloride: 107 mmol/L (ref 98–111)
Creatinine, Ser: 0.87 mg/dL (ref 0.44–1.00)
GFR, Estimated: >60 mL/min (ref >60)
Glucose, Bld: 124 mg/dL — ABNORMAL HIGH (ref 70–99)
Potassium: 4.1 mmol/L (ref 3.5–5.1)
Sodium: 138 mmol/L (ref 135–145)
Total Bilirubin: 0.3 mg/dL (ref 0.3–1.2)
Total Protein: 5.5 g/dL — ABNORMAL LOW (ref 6.5–8.1)

## 2022-11-02 LAB — CBC WITH DIFFERENTIAL/PLATELET
Abs Immature Granulocytes: 0.26 10*3/uL — ABNORMAL HIGH (ref 0.00–0.07)
Basophils Absolute: 0.1 10*3/uL (ref 0.0–0.1)
Basophils Relative: 1 %
Eosinophils Absolute: 0.1 10*3/uL (ref 0.0–0.5)
Eosinophils Relative: 2 %
HCT: 27.8 % — ABNORMAL LOW (ref 36.0–46.0)
Hemoglobin: 8.7 g/dL — ABNORMAL LOW (ref 12.0–15.0)
Immature Granulocytes: 3 %
Lymphocytes Relative: 7 %
Lymphs Abs: 0.6 10*3/uL — ABNORMAL LOW (ref 0.7–4.0)
MCH: 28.2 pg (ref 26.0–34.0)
MCHC: 31.3 g/dL (ref 30.0–36.0)
MCV: 90 fL (ref 80.0–100.0)
Monocytes Absolute: 1.1 10*3/uL — ABNORMAL HIGH (ref 0.1–1.0)
Monocytes Relative: 14 %
Neutro Abs: 5.7 10*3/uL (ref 1.7–7.7)
Neutrophils Relative %: 73 %
Platelets: 140 10*3/uL — ABNORMAL LOW (ref 150–400)
RBC: 3.09 MIL/uL — ABNORMAL LOW (ref 3.87–5.11)
RDW: 16.4 % — ABNORMAL HIGH (ref 11.5–15.5)
WBC: 7.8 10*3/uL (ref 4.0–10.5)
nRBC: 0.4 % — ABNORMAL HIGH (ref 0.0–0.2)

## 2022-11-02 LAB — MAGNESIUM: Magnesium: 2 mg/dL (ref 1.7–2.4)

## 2022-11-02 LAB — PHOSPHORUS: Phosphorus: 2.4 mg/dL — ABNORMAL LOW (ref 2.5–4.6)

## 2022-11-02 LAB — OSMOLALITY, STOOL: Osmolality,Stl: 388 mOsmol/kg

## 2022-11-02 NOTE — Progress Notes (Signed)
PROGRESS NOTE    Cynthia Velasquez  [id number] DOB: 1950/11/29 DOA: 10/28/2022 PCP: Kathyrn Lass, MD     Brief Narrative:  72 y.o. BF PMHx Anal cancer (Dx 06/2019), anxiety, depression, osteoarthritis, LEFT  breast cancer (Stage Iia, Dx 08/2013), gallstones, GERD, hiatal hernia, migraine headaches, hyperlipidemia, hypertension, peripheral neuropathy, prediabetes who was sent from the cancer center due to diarrhea for the past 3 weeks.   Upon evaluation at the cancer center on 2/9 for her ongoing symptoms, stool studies were obtained and came back positive for Sapovirus.  Due to patient's severe unrelenting symptoms patient presented to [facility] emergency department for evaluation from the cancer center.   Upon evaluation in the cancer center patient was found to be hypokalemic and volume depleted.  The hospitalist group was called to assess the patient for admission to the hospital.   Patient was also identified to have Klebsiella pneumoniae urinary tract infection based on urine culture from 2/8. NOTE patient last Colonoscopy 12/07/2021  Subjective: 2/17, A/O x 4 afebrile overnight.  States negative postprandial diarrhea, negative nausea, negative vomiting, negative abdominal pain.    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 how long would you treat for this virus? -2/14 stool osmotic gap: Stool Osm, (stool NA /K) pending - 2/14 fecal fat pending - 2/14 fecal lactoferrin negative - 2/14 occult blood pending -2/14 TSH pending - 2/14 n.p.o. -2/14 normal saline+ KCl 20  meq/L 169m/hr -Lomotil 2.5-0.025 mg - 2/14 increase Florastor 250 mg TID -2/14 if unable to control diarrhea/diagnose additional cause consult GI colonoscopy? -2/15 Octreotide 1070m SQ TID -2/15 unable to obtain Dx/control of watery diarrhea in next 48 hours will consult GI, request colonoscopy. Dr. KaIrene Limboncology concurs -2/15 discuss case with Dr. VaTommy MedalD and Acyclovir would have no effect on Sapovirus, okay to discontinue.Dr. KaIrene Limboncology concurs -2/16 patient without diarrhea overnight and abdomen no longer tender.  Advance diet to full liquid. -2/17 patient tolerated full liquid diet overnight.  Advance to bland diet  None anion gap metabolic acidosis - 2/123456x amp sodium bicarb -2/17 resolved  Positive Klebsiella UTI -2/14 complete 5-day course antibiotics.  May have to extend given patient's immunosuppression -2/15 discussed case with Dr. VaTommy MedalD cleared to stop antibiotic after 3 days, unless symptomatic.  Pancytopenia -Secondary to chemotherapy.  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  Last seen on 2/9 -Per oncology note 2/9  Last treatment:  Day 2   Cycle 6 on 10/23/22 -Last day of etopside was held 2/2 diarrhea so patient has completed course of chemo.  -2/14 will consult oncology in a.m. given that patient has completed chemo 7 days ago could still be having effect on her having diarrhea? -2/15 discussed case at length with Dr. KaIrene Limboncology who stated given that patient had XRT 2023+ chemotherapy could be causing/contributing to patient's diarrhea.  Chest pain -2/14 most likely secondary to patient's significant electrolyte abnormalities - 2/14  when compared to 10/28/22 no Significant change -2/14 trend troponin  Latest  Reference Range & Units 10/30/22 17:57 10/31/22 10:18  Troponin I (High Sensitivity) <18 ng/L 3 3  -2/15 resolved most likely secondary to patient's  significant electrolyte abnormality  Essential hypertension -Losartan 100 mg daily    GERD without esophagitis -2/17 no symptoms with resumption of diet  Hypokalemia - Potassium goal>4 -2/14 see diarrhea -2/15 sodium Phosphate 30 mmol -2/16 Potassium IV 60 mEq  Hypophosphatemia -Phosphorus goal>2.5  Hypomagnesmia -Magnesium goal>2 - 2/15 magnesium IV 2 g  Obesity (BMI 34.6 kg/m.) -Discussed with PCP        Mobility Assessment (last 72 hours)     Mobility Assessment     Row Name 11/02/22 1100 11/01/22 2040 11/01/22 1300 10/31/22 2135 10/30/22 2100   Does patient have an order for bedrest or is patient medically unstable No - Continue assessment No - Continue assessment No - Continue assessment No - Continue assessment No - Continue assessment   What is the highest level of mobility based on the progressive mobility assessment? Level 5 (Walks with assist in room/hall) - Balance while stepping forward/back and can walk in room with assist - Complete Level 5 (Walks with assist in room/hall) - Balance while stepping forward/back and can walk in room with assist - Complete -- Level 5 (Walks with assist in room/hall) - Balance while stepping forward/back and can walk in room with assist - Complete Level 5 (Walks with assist in room/hall) - Balance while stepping forward/back and can walk in room with assist - Complete                  DVT prophylaxis: Subcu heparin Code Status:  Family Communication:  Status is: Inpatient    Dispo: The patient is from: Home              Anticipated d/c is to: Home              Anticipated d/c date is: > 3 days              Patient currently is not medically stable to d/c.      Consultants:  Dr. Irene Limbo oncology   Procedures/Significant Events:  2/13 CT Abd and Pelvis w/contrast; No evidence of bowel obstruction or free air. 2. Air-fluid levels in the colon, compatible with history of diarrhea. 3. Small hiatal hernia. 4.  Aortic atherosclerosis. 5. 3 mm subpleural nodule in the right lower lobe. No follow-up needed if patient is low-risk.    I have personally reviewed and interpreted all radiology studies and my findings are as above.  VENTILATOR SETTINGS:    Cultures 2/8 stool positive Sapovirus 2/8 urine positive Klebsiella    Antimicrobials: Anti-infectives (From admission, onward)    Start     Ordered Stop   10/29/22 1100  cefTRIAXone (ROCEPHIN) 1 g in sodium chloride 0.9 % 100 mL IVPB        10/29/22 0938 11/02/22 2359   10/28/22 1915  acyclovir (ZOVIRAX) tablet 400 mg  Status:  Discontinued        10/28/22 1828 10/31/22 1517           Devices    LINES / TUBES:      Continuous Infusions:     Objective: Vitals:   11/01/22 1250 11/01/22 2041 11/02/22 0526 11/02/22 1350  BP: 118/71 (!) 154/75 136/72 132/84  Pulse: 87 89 83 89  Resp: 18  16 17  $ Temp: 98.4 F (36.9 C) 99.2 F (37.3 C) 97.8 F (36.6 C) (!) 97.5 F (  36.4 C)  TempSrc: Oral Oral Oral Oral  SpO2: 97% 93% 97%   Weight:      Height:        Intake/Output Summary (Last 24 hours) at 11/02/2022 1623 Last data filed at 11/02/2022 0400 Gross per 24 hour  Intake 657.44 ml  Output --  Net 657.44 ml    Filed Weights   10/28/22 1200 10/28/22 1225  Weight: 94.3 kg 94.3 kg   Physical Exam:  General: A/O x 4, No acute respiratory distress Eyes: negative scleral hemorrhage, negative anisocoria, negative icterus ENT: Negative Runny nose, negative gingival bleeding, Neck:  Negative scars, masses, torticollis, lymphadenopathy, JVD Lungs: Clear to auscultation bilaterally without wheezes or crackles Cardiovascular: Regular rate and rhythm without murmur gallop or rub normal S1 and S2 Abdomen: negative abdominal pain, nondistended, positive soft, bowel sounds, no rebound, no ascites, no appreciable mass Extremities: No significant cyanosis, clubbing, or edema bilateral lower extremities Skin: Negative rashes,  lesions, ulcers Psychiatric:  Negative depression, negative anxiety, negative fatigue, negative mania  Central nervous system:  Cranial nerves II through XII intact, tongue/uvula midline, all extremities muscle strength 5/5, sensation intact throughout, negative dysarthria, negative expressive aphasia, negative receptive aphasia.      Data Reviewed: Care during the described time interval was provided by me .  I have reviewed this patient's available data, including medical history, events of note, physical examination, and all test results as part of my evaluation.  CBC: Recent Labs  Lab 10/30/22 0650 10/30/22 1037 10/31/22 0435 11/01/22 0441 11/02/22 0500  WBC 2.8* 3.0* 5.3 7.0 7.8  NEUTROABS 1.2* 1.1* 3.4 4.7 5.7  HGB 8.4* 8.5* 8.1* 8.5* 8.7*  HCT 27.7* 27.7* 26.8* 27.1* 27.8*  MCV 93.6 93.0 92.1 89.7 90.0  PLT 101* 101* 100* 117* 140*    Basic Metabolic Panel: Recent Labs  Lab 10/30/22 0650 10/30/22 1037 10/31/22 0435 11/01/22 0441 11/02/22 0500  NA 139 139 138 139 138  K 3.4* 3.4* 3.7 3.2* 4.1  CL 118* 119* 116* 109 107  CO2 15* 16* 17* 23 25  GLUCOSE 93 90 98 136* 124*  BUN <5* <5* <5* <5* <5*  CREATININE 0.82 0.82 0.79 0.94 0.87  CALCIUM 8.4* 8.4* 8.2* 8.0* 8.3*  MG 1.4* 1.4* 1.9 2.3 2.0  PHOS 2.1* 2.0* 2.4* 4.4 2.4*    GFR: Estimated Creatinine Clearance: 67.3 mL/min (by C-G formula based on SCr of 0.87 mg/dL). Liver Function Tests: Recent Labs  Lab 10/30/22 0650 10/30/22 1037 10/31/22 0435 11/01/22 0441 11/02/22 0500  AST 21 21 17 17 17  $ ALT 74* 70* 55* 43 33  ALKPHOS 76 72 77 84 90  BILITOT 0.5 0.5 0.5 0.4 0.3  PROT 5.8* 5.7* 5.3* 5.2* 5.5*  ALBUMIN 2.7* 2.7* 2.8* 2.6* 2.8*    Recent Labs  Lab 10/28/22 1249  LIPASE 29    No results for input(s): "AMMONIA" in the last 168 hours. Coagulation Profile: No results for input(s): "INR", "PROTIME" in the last 168 hours. Cardiac Enzymes: No results for input(s): "CKTOTAL", "CKMB", "CKMBINDEX",  "TROPONINI" in the last 168 hours. BNP (last 3 results) No results for input(s): "PROBNP" in the last 8760 hours. HbA1C: No results for input(s): "HGBA1C" in the last 72 hours. CBG: No results for input(s): "GLUCAP" in the last 168 hours. Lipid Profile: No results for input(s): "CHOL", "HDL", "LDLCALC", "TRIG", "CHOLHDL", "LDLDIRECT" in the last 72 hours. Thyroid Function Tests: No results for input(s): "TSH", "T4TOTAL", "FREET4", "T3FREE", "THYROIDAB" in the last 72 hours. Anemia Panel: No  results for input(s): "VITAMINB12", "FOLATE", "FERRITIN", "TIBC", "IRON", "RETICCTPCT" in the last 72 hours. Sepsis Labs: No results for input(s): "PROCALCITON", "LATICACIDVEN" in the last 168 hours.  Recent Results (from the past 240 hour(s))  Culture, Urine     Status: Abnormal   Collection Time: 10/24/22  2:40 PM   Specimen: Urine, Clean Catch  Result Value Ref Range Status   Specimen Description   Final    URINE, CLEAN CATCH Performed at [facility], [address], [place], [place] [address]    Special Requests   Final    NONE Performed at [facility], [place] [address], [place], [place] [address]    Culture 60,000 COLONIES/mL KLEBSIELLA PNEUMONIAE (A)  Final   Report Status 10/26/2022 FINAL  Final   Organism ID, Bacteria KLEBSIELLA PNEUMONIAE (A)  Final      Susceptibility   Klebsiella pneumoniae - MIC*    AMPICILLIN RESISTANT Resistant     CEFEPIME <=0.12 SENSITIVE Sensitive     CEFTRIAXONE <=0.25 SENSITIVE Sensitive     CIPROFLOXACIN <=0.25 SENSITIVE Sensitive     GENTAMICIN <=1 SENSITIVE Sensitive     IMIPENEM <=0.25 SENSITIVE Sensitive     NITROFURANTOIN 32 SENSITIVE Sensitive     TRIMETH/SULFA <=20 SENSITIVE Sensitive     AMPICILLIN/SULBACTAM <=2 SENSITIVE Sensitive     PIP/TAZO <=4 SENSITIVE Sensitive     * 60,000 COLONIES/mL KLEBSIELLA PNEUMONIAE  C difficile quick screen w PCR reflex     Status: None   Collection Time:  10/24/22  3:35 PM   Specimen: STOOL  Result Value Ref Range Status   C Diff antigen NEGATIVE NEGATIVE Final   C Diff toxin NEGATIVE NEGATIVE Final   C Diff interpretation No C. difficile detected.  Final    Comment: Performed at [facility], [place] [address] [place], [place] [address]  GI pathogen panel by PCR, stool     Status: Abnormal   Collection Time: 10/24/22  3:35 PM   Specimen: Stool  Result Value Ref Range Status   Plesiomonas shigelloides NOT DETECTED NOT DETECTED Final   Yersinia enterocolitica NOT DETECTED NOT DETECTED Final   Vibrio NOT DETECTED NOT DETECTED Final   Enteropathogenic E coli NOT DETECTED NOT DETECTED Final   E coli (ETEC) LT/ST NOT DETECTED NOT DETECTED Final   E coli [id number] by PCR Not applicable NOT DETECTED Final   Cryptosporidium by PCR NOT DETECTED NOT DETECTED Final   Entamoeba histolytica NOT DETECTED NOT DETECTED Final   Adenovirus F 40/41 NOT DETECTED NOT DETECTED Final   Norovirus GI/GII NOT DETECTED NOT DETECTED Final   Sapovirus DETECTED (A) NOT DETECTED Final    Comment: (NOTE) Performed At: [facility] [place], [place] [id number] Rush Farmer MD [id number]    Vibrio cholerae NOT DETECTED NOT DETECTED Final   Campylobacter by PCR NOT DETECTED NOT DETECTED Final   Salmonella by PCR NOT DETECTED NOT DETECTED Final   E coli (STEC) NOT DETECTED NOT DETECTED Final   Enteroaggregative E coli NOT DETECTED NOT DETECTED Final   Shigella by PCR NOT DETECTED NOT DETECTED Final   Cyclospora cayetanensis NOT DETECTED NOT DETECTED Final   Astrovirus NOT DETECTED NOT DETECTED Final   G lamblia by PCR NOT DETECTED NOT DETECTED Final   Rotavirus A by PCR NOT DETECTED NOT DETECTED Final         Radiology Studies: No results found.      Scheduled Meds:  Chlorhexidine Gluconate  Cloth  6 each Topical Daily   heparin  5,000 Units Subcutaneous Q8H   losartan  100 mg Oral Daily   octreotide  100 mcg  Subcutaneous TID   pantoprazole  40 mg Oral Daily   saccharomyces boulardii  250 mg Oral TID   Continuous Infusions:     LOS: 3 days    Time spent:40 min    Asier Desroches, Geraldo Docker, MD [facility]   If 7PM-7AM, please contact night-coverage 11/02/2022, 4:23 PM

## 2022-11-03 DIAGNOSIS — E876 Hypokalemia: Secondary | ICD-10-CM | POA: Diagnosis not present

## 2022-11-03 DIAGNOSIS — I10 Essential (primary) hypertension: Secondary | ICD-10-CM | POA: Diagnosis not present

## 2022-11-03 DIAGNOSIS — E860 Dehydration: Secondary | ICD-10-CM | POA: Diagnosis not present

## 2022-11-03 DIAGNOSIS — A09 Infectious gastroenteritis and colitis, unspecified: Secondary | ICD-10-CM | POA: Diagnosis not present

## 2022-11-03 LAB — PHOSPHORUS: Phosphorus: 2.6 mg/dL (ref 2.5–4.6)

## 2022-11-03 LAB — COMPREHENSIVE METABOLIC PANEL
ALT: 27 U/L (ref 0–44)
AST: 18 U/L (ref 15–41)
Albumin: 2.9 g/dL — ABNORMAL LOW (ref 3.5–5.0)
Alkaline Phosphatase: 105 U/L (ref 38–126)
Anion gap: 5 (ref 5–15)
BUN: 6 mg/dL — ABNORMAL LOW (ref 8–23)
CO2: 25 mmol/L (ref 22–32)
Calcium: 8.1 mg/dL — ABNORMAL LOW (ref 8.9–10.3)
Chloride: 107 mmol/L (ref 98–111)
Creatinine, Ser: 1.03 mg/dL — ABNORMAL HIGH (ref 0.44–1.00)
GFR, Estimated: 58 mL/min — ABNORMAL LOW (ref >60)
Glucose, Bld: 128 mg/dL — ABNORMAL HIGH (ref 70–99)
Potassium: 3.4 mmol/L — ABNORMAL LOW (ref 3.5–5.1)
Sodium: 137 mmol/L (ref 135–145)
Total Bilirubin: 0.7 mg/dL (ref 0.3–1.2)
Total Protein: 6.5 g/dL (ref 6.5–8.1)

## 2022-11-03 LAB — CBC WITH DIFFERENTIAL/PLATELET
Abs Immature Granulocytes: 0.12 10*3/uL — ABNORMAL HIGH (ref 0.00–0.07)
Basophils Absolute: 0 10*3/uL (ref 0.0–0.1)
Basophils Relative: 1 %
Eosinophils Absolute: 0.1 10*3/uL (ref 0.0–0.5)
Eosinophils Relative: 2 %
HCT: 31.4 % — ABNORMAL LOW (ref 36.0–46.0)
Hemoglobin: 9.6 g/dL — ABNORMAL LOW (ref 12.0–15.0)
Immature Granulocytes: 2 %
Lymphocytes Relative: 11 %
Lymphs Abs: 0.8 10*3/uL (ref 0.7–4.0)
MCH: 27.7 pg (ref 26.0–34.0)
MCHC: 30.6 g/dL (ref 30.0–36.0)
MCV: 90.5 fL (ref 80.0–100.0)
Monocytes Absolute: 1 10*3/uL (ref 0.1–1.0)
Monocytes Relative: 13 %
Neutro Abs: 5.2 10*3/uL (ref 1.7–7.7)
Neutrophils Relative %: 71 %
Platelets: 204 10*3/uL (ref 150–400)
RBC: 3.47 MIL/uL — ABNORMAL LOW (ref 3.87–5.11)
RDW: 16.6 % — ABNORMAL HIGH (ref 11.5–15.5)
WBC: 7.3 10*3/uL (ref 4.0–10.5)
nRBC: 0 % (ref 0.0–0.2)

## 2022-11-03 LAB — MAGNESIUM: Magnesium: 1.8 mg/dL (ref 1.7–2.4)

## 2022-11-03 LAB — POTASSIUM, STOOL: Potassium, Stl: 41 mmol/L

## 2022-11-03 LAB — SODIUM, STOOL: Sodium, Stl: 98 mmol/L

## 2022-11-03 MED ORDER — MAGNESIUM SULFATE 2 GM/50ML IV SOLN
2.0000 g | Freq: Once | INTRAVENOUS | Status: AC
  Administered 2022-11-03: 2 g via INTRAVENOUS
  Filled 2022-11-03: qty 50

## 2022-11-03 MED ORDER — POTASSIUM CHLORIDE 10 MEQ/100ML IV SOLN
10.0000 meq | INTRAVENOUS | Status: AC
  Administered 2022-11-03 (×6): 10 meq via INTRAVENOUS
  Filled 2022-11-03 (×6): qty 100

## 2022-11-03 NOTE — Progress Notes (Signed)
Mobility Specialist - Progress Note   11/03/22 1023  Mobility  Activity Ambulated with assistance in hallway  Level of Assistance Modified independent, requires aide device or extra time  Assistive Device Front wheel walker  Distance Ambulated (ft) 750 ft  Activity Response Tolerated well  Mobility Referral Yes  $Mobility charge 1 Mobility   Pt received in bed and agreed to mobility, had no c/o pain nor discomfort during session. Pt returned to chair with all needs met.  Roderick Pee Mobility Specialist

## 2022-11-03 NOTE — Progress Notes (Signed)
PROGRESS NOTE    Cynthia Velasquez  [id number] DOB: 1950-12-20 DOA: 10/28/2022 PCP: Kathyrn Lass, MD     Brief Narrative:  72 y.o. BF PMHx Anal cancer (Dx 06/2019), anxiety, depression, osteoarthritis, LEFT  breast cancer (Stage Iia, Dx 08/2013), gallstones, GERD, hiatal hernia, migraine headaches, hyperlipidemia, hypertension, peripheral neuropathy, prediabetes who was sent from the cancer center due to diarrhea for the past 3 weeks.   Upon evaluation at the cancer center on 2/9 for her ongoing symptoms, stool studies were obtained and came back positive for Sapovirus.  Due to patient's severe unrelenting symptoms patient presented to [facility] emergency department for evaluation from the cancer center.   Upon evaluation in the cancer center patient was found to be hypokalemic and volume depleted.  The hospitalist group was called to assess the patient for admission to the hospital.   Patient was also identified to have Klebsiella pneumoniae urinary tract infection based on urine culture from 2/8. NOTE patient last Colonoscopy 12/07/2021  Subjective: 2/18 afebrile overnight A/O x 4 negative nausea, negative vomiting, negative diarrhea.  Would like to be discharged in a.m. if she continues to tolerate meals.    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 how long would you treat for this virus? -2/14 stool osmotic gap: Stool Osm, (stool NA /K) pending - 2/14 fecal fat pending - 2/14 fecal lactoferrin negative - 2/14 occult blood pending -2/14 TSH pending - 2/14 n.p.o. -2/14  normal saline+ KCl 20 meq/L 176m/hr -Lomotil 2.5-0.025 mg - 2/14 increase Florastor 250 mg TID -2/14 if unable to control diarrhea/diagnose additional cause consult GI colonoscopy? -2/15 Octreotide 1032m SQ TID -2/15 unable to obtain Dx/control of watery diarrhea in next 48 hours will consult GI, request colonoscopy. Dr. KaIrene Limboncology concurs -2/15 discuss case with Dr. VaTommy MedalD and Acyclovir would have no effect on Sapovirus, okay to discontinue.Dr. KaIrene Limboncology concurs -2/16 patient without diarrhea overnight and abdomen no longer tender.  Advance diet to full liquid. -2/17 patient tolerated full liquid diet overnight.  Advance to bland diet -2/18 nursing to allow patient to administer her own octreotide SQ overnight. -2/18 will discharge with octreotide Sq, allow Dr. KaIrene Limboncology to obtain p.o. version as our pharmacy does not stock.  None anion gap metabolic acidosis - 2/123456x amp sodium bicarb -2/17 resolved  Positive Klebsiella UTI -2/14 complete 5-day course antibiotics.  May have to extend given patient's immunosuppression -2/15 discussed case with Dr. VaTommy MedalD cleared to stop antibiotic after 3 days, unless symptomatic. -2/18 RESOLVED  Pancytopenia -Secondary to chemotherapy.  See lymphoma  Peripheral T cell [place] of [place]Pt seen by Dr KaNeoma Lamingncology.  Last seen on 2/9 -Per oncology note 2/9  Last treatment:  Day 2   Cycle 6 on 10/23/22 -Last day of etopside was held 2/2 diarrhea so patient has completed course of chemo.  -2/14 will consult oncology in a.m. given that patient has completed chemo 7 days ago could still be having effect on her having diarrhea? -2/15 discussed case at length with Dr. KaIrene Limboncology  who stated given that patient had XRT 2023+ chemotherapy could be causing/contributing to patient's diarrhea.  Chest pain -2/14 most likely secondary to patient's significant electrolyte abnormalities - 2/14   when compared to 10/28/22 no Significant change -2/14 trend troponin  Latest Reference Range & Units 10/30/22 17:57 10/31/22 10:18  Troponin I (High Sensitivity) <18 ng/L 3 3  -2/15 resolved most likely secondary to patient's significant electrolyte abnormality  Essential hypertension -Losartan 100 mg daily    GERD without esophagitis -2/17 no symptoms with resumption of diet  Hypokalemia - Potassium goal>4 -2/14 see diarrhea -2/15 sodium Phosphate 30 mmol -2/16 Potassium IV 60 mEq -2/18 Potassium IV 60 mEq  Hypophosphatemia -Phosphorus goal>2.5  Hypomagnesmia -Magnesium goal>2 - 2/15 magnesium IV 2 g -2/18 Magnesium IV 2 g  Obesity (BMI 34.6 kg/m.) -Discuss with PCP        Mobility Assessment (last 72 hours)     Mobility Assessment     Row Name 11/03/22 1202 11/02/22 2046 11/02/22 1100 11/01/22 2040 11/01/22 1300   Does patient have an order for bedrest or is patient medically unstable No - Continue assessment No - Continue assessment No - Continue assessment No - Continue assessment No - Continue assessment   What is the highest level of mobility based on the progressive mobility assessment? Level 5 (Walks with assist in room/hall) - Balance while stepping forward/back and can walk in room with assist - Complete Level 5 (Walks with assist in room/hall) - Balance while stepping forward/back and can walk in room with assist - Complete Level 5 (Walks with assist in room/hall) - Balance while stepping forward/back and can walk in room with assist - Complete Level 5 (Walks with assist in room/hall) - Balance while stepping forward/back and can walk in room with assist - Complete --    Row Name 10/31/22 2135           Does patient have an order for bedrest or is patient medically unstable No - Continue assessment       What is the highest level of mobility based on the progressive mobility assessment? Level 5 (Walks with assist in room/hall) - Balance while stepping  forward/back and can walk in room with assist - Complete                      DVT prophylaxis: Subcu heparin Code Status:  Family Communication:  Status is: Inpatient    Dispo: The patient is from: Home              Anticipated d/c is to: Home              Anticipated d/c date is: 1 day              Patient currently is not medically stable to d/c.      Consultants:  Dr. Irene Limbo oncology   Procedures/Significant Events:  2/13 CT Abd and Pelvis w/contrast; No evidence of bowel obstruction or free air. 2. Air-fluid levels in the colon, compatible with history of diarrhea. 3. Small hiatal hernia. 4. Aortic atherosclerosis. 5. 3 mm subpleural nodule in the right lower lobe. No follow-up needed if patient is low-risk.    I have personally reviewed and interpreted all radiology studies and my findings are as above.  VENTILATOR SETTINGS:    Cultures 2/8 stool positive Sapovirus 2/8 urine positive Klebsiella    Antimicrobials: Anti-infectives (From admission, onward)    Start     Ordered Stop  10/29/22 1100  cefTRIAXone (ROCEPHIN) 1 g in sodium chloride 0.9 % 100 mL IVPB        10/29/22 0938 11/02/22 2359   10/28/22 1915  acyclovir (ZOVIRAX) tablet 400 mg  Status:  Discontinued        10/28/22 1828 10/31/22 1517           Devices    LINES / TUBES:      Continuous Infusions:     Objective: Vitals:   11/01/22 2041 11/02/22 0526 11/02/22 1350 11/02/22 2036  BP: (!) 154/75 136/72 132/84 110/78  Pulse: 89 83 89 81  Resp:  16 17 18  $ Temp: 99.2 F (37.3 C) 97.8 F (36.6 C) (!) 97.5 F (36.4 C) 98.5 F (36.9 C)  TempSrc: Oral Oral Oral Oral  SpO2: 93% 97%    Weight:      Height:        Intake/Output Summary (Last 24 hours) at 11/03/2022 1322 Last data filed at 11/02/2022 1700 Gross per 24 hour  Intake 480 ml  Output --  Net 480 ml    Filed Weights   10/28/22 1200 10/28/22 1225  Weight: 94.3 kg 94.3 kg    Physical  Exam:  General: A/O x 4, No acute respiratory distress Eyes: negative scleral hemorrhage, negative anisocoria, negative icterus ENT: Negative Runny nose, negative gingival bleeding, Neck:  Negative scars, masses, torticollis, lymphadenopathy, JVD Lungs: Clear to auscultation bilaterally without wheezes or crackles Cardiovascular: Regular rate and rhythm without murmur gallop or rub normal S1 and S2 Abdomen: negative abdominal pain, nondistended, positive soft, bowel sounds, no rebound, no ascites, no appreciable mass Extremities: No significant cyanosis, clubbing, or edema bilateral lower extremities Skin: Negative rashes, lesions, ulcers Psychiatric:  Negative depression, negative anxiety, negative fatigue, negative mania  Central nervous system:  Cranial nerves II through XII intact, tongue/uvula midline, all extremities muscle strength 5/5, sensation intact throughout, negative dysarthria, negative expressive aphasia, negative receptive aphasia.      Data Reviewed: Care during the described time interval was provided by me .  I have reviewed this patient's available data, including medical history, events of note, physical examination, and all test results as part of my evaluation.  CBC: Recent Labs  Lab 10/30/22 1037 10/31/22 0435 11/01/22 0441 11/02/22 0500 11/03/22 0800  WBC 3.0* 5.3 7.0 7.8 7.3  NEUTROABS 1.1* 3.4 4.7 5.7 5.2  HGB 8.5* 8.1* 8.5* 8.7* 9.6*  HCT 27.7* 26.8* 27.1* 27.8* 31.4*  MCV 93.0 92.1 89.7 90.0 90.5  PLT 101* 100* 117* 140* 0000000    Basic Metabolic Panel: Recent Labs  Lab 10/30/22 1037 10/31/22 0435 11/01/22 0441 11/02/22 0500 11/03/22 0800  NA 139 138 139 138 137  K 3.4* 3.7 3.2* 4.1 3.4*  CL 119* 116* 109 107 107  CO2 16* 17* 23 25 25  $ GLUCOSE 90 98 136* 124* 128*  BUN <5* <5* <5* <5* 6*  CREATININE 0.82 0.79 0.94 0.87 1.03*  CALCIUM 8.4* 8.2* 8.0* 8.3* 8.1*  MG 1.4* 1.9 2.3 2.0 1.8  PHOS 2.0* 2.4* 4.4 2.4* 2.6    GFR: Estimated  Creatinine Clearance: 56.9 mL/min (A) (by C-G formula based on SCr of 1.03 mg/dL (H)). Liver Function Tests: Recent Labs  Lab 10/30/22 1037 10/31/22 0435 11/01/22 0441 11/02/22 0500 11/03/22 0800  AST 21 17 17 17 18  $ ALT 70* 55* 43 33 27  ALKPHOS 72 77 84 90 105  BILITOT 0.5 0.5 0.4 0.3 0.7  PROT 5.7* 5.3* 5.2* 5.5* 6.5  ALBUMIN 2.7*  2.8* 2.6* 2.8* 2.9*    Recent Labs  Lab 10/28/22 1249  LIPASE 29    No results for input(s): "AMMONIA" in the last 168 hours. Coagulation Profile: No results for input(s): "INR", "PROTIME" in the last 168 hours. Cardiac Enzymes: No results for input(s): "CKTOTAL", "CKMB", "CKMBINDEX", "TROPONINI" in the last 168 hours. BNP (last 3 results) No results for input(s): "PROBNP" in the last 8760 hours. HbA1C: No results for input(s): "HGBA1C" in the last 72 hours. CBG: No results for input(s): "GLUCAP" in the last 168 hours. Lipid Profile: No results for input(s): "CHOL", "HDL", "LDLCALC", "TRIG", "CHOLHDL", "LDLDIRECT" in the last 72 hours. Thyroid Function Tests: No results for input(s): "TSH", "T4TOTAL", "FREET4", "T3FREE", "THYROIDAB" in the last 72 hours. Anemia Panel: No results for input(s): "VITAMINB12", "FOLATE", "FERRITIN", "TIBC", "IRON", "RETICCTPCT" in the last 72 hours. Sepsis Labs: No results for input(s): "PROCALCITON", "LATICACIDVEN" in the last 168 hours.  Recent Results (from the past 240 hour(s))  Culture, Urine     Status: Abnormal   Collection Time: 10/24/22  2:40 PM   Specimen: Urine, Clean Catch  Result Value Ref Range Status   Specimen Description   Final    URINE, CLEAN CATCH Performed at [facility], [address] [place], [place] [address]    Special Requests   Final    NONE Performed at [facility], [place] [address], [place], [place] [address]    Culture 60,000 COLONIES/mL KLEBSIELLA PNEUMONIAE (A)  Final   Report Status 10/26/2022 FINAL  Final   Organism  ID, Bacteria KLEBSIELLA PNEUMONIAE (A)  Final      Susceptibility   Klebsiella pneumoniae - MIC*    AMPICILLIN RESISTANT Resistant     CEFEPIME <=0.12 SENSITIVE Sensitive     CEFTRIAXONE <=0.25 SENSITIVE Sensitive     CIPROFLOXACIN <=0.25 SENSITIVE Sensitive     GENTAMICIN <=1 SENSITIVE Sensitive     IMIPENEM <=0.25 SENSITIVE Sensitive     NITROFURANTOIN 32 SENSITIVE Sensitive     TRIMETH/SULFA <=20 SENSITIVE Sensitive     AMPICILLIN/SULBACTAM <=2 SENSITIVE Sensitive     PIP/TAZO <=4 SENSITIVE Sensitive     * 60,000 COLONIES/mL KLEBSIELLA PNEUMONIAE  C difficile quick screen w PCR reflex     Status: None   Collection Time: 10/24/22  3:35 PM   Specimen: STOOL  Result Value Ref Range Status   C Diff antigen NEGATIVE NEGATIVE Final   C Diff toxin NEGATIVE NEGATIVE Final   C Diff interpretation No C. difficile detected.  Final    Comment: Performed at [facility], [place] [address], [place], [place] [address]  GI pathogen panel by PCR, stool     Status: Abnormal   Collection Time: 10/24/22  3:35 PM   Specimen: Stool  Result Value Ref Range Status   Plesiomonas shigelloides NOT DETECTED NOT DETECTED Final   Yersinia enterocolitica NOT DETECTED NOT DETECTED Final   Vibrio NOT DETECTED NOT DETECTED Final   Enteropathogenic E coli NOT DETECTED NOT DETECTED Final   E coli (ETEC) LT/ST NOT DETECTED NOT DETECTED Final   E coli [id number] by PCR Not applicable NOT DETECTED Final   Cryptosporidium by PCR NOT DETECTED NOT DETECTED Final   Entamoeba histolytica NOT DETECTED NOT DETECTED Final   Adenovirus F 40/41 NOT DETECTED NOT DETECTED Final   Norovirus GI/GII NOT DETECTED NOT DETECTED Final   Sapovirus DETECTED (A) NOT DETECTED Final    Comment: (NOTE) Performed At: [facility]  [address] [place], [place] [id number] Rush Farmer MD [id number]    Vibrio cholerae NOT DETECTED NOT DETECTED Final   Campylobacter by PCR NOT DETECTED NOT DETECTED Final    Salmonella by PCR NOT DETECTED NOT DETECTED Final   E coli (STEC) NOT DETECTED NOT DETECTED Final   Enteroaggregative E coli NOT DETECTED NOT DETECTED Final   Shigella by PCR NOT DETECTED NOT DETECTED Final   Cyclospora cayetanensis NOT DETECTED NOT DETECTED Final   Astrovirus NOT DETECTED NOT DETECTED Final   G lamblia by PCR NOT DETECTED NOT DETECTED Final   Rotavirus A by PCR NOT DETECTED NOT DETECTED Final         Radiology Studies: No results found.      Scheduled Meds:  Chlorhexidine Gluconate Cloth  6 each Topical Daily   heparin  5,000 Units Subcutaneous Q8H   losartan  100 mg Oral Daily   octreotide  100 mcg Subcutaneous TID   pantoprazole  40 mg Oral Daily   saccharomyces boulardii  250 mg Oral TID   Continuous Infusions:     LOS: 4 days    Time spent:40 min    Veta Dambrosia, Geraldo Docker, MD [facility]   If 7PM-7AM, please contact night-coverage 11/03/2022, 1:22 PM

## 2022-11-04 DIAGNOSIS — I10 Essential (primary) hypertension: Secondary | ICD-10-CM | POA: Diagnosis not present

## 2022-11-04 DIAGNOSIS — E876 Hypokalemia: Secondary | ICD-10-CM | POA: Diagnosis not present

## 2022-11-04 DIAGNOSIS — A09 Infectious gastroenteritis and colitis, unspecified: Secondary | ICD-10-CM | POA: Diagnosis not present

## 2022-11-04 DIAGNOSIS — B3731 Acute candidiasis of vulva and vagina: Secondary | ICD-10-CM | POA: Diagnosis not present

## 2022-11-04 DIAGNOSIS — E860 Dehydration: Secondary | ICD-10-CM | POA: Diagnosis not present

## 2022-11-04 LAB — CBC WITH DIFFERENTIAL/PLATELET
Abs Immature Granulocytes: 0.08 10*3/uL — ABNORMAL HIGH (ref 0.00–0.07)
Basophils Absolute: 0 10*3/uL (ref 0.0–0.1)
Basophils Relative: 0 %
Eosinophils Absolute: 0.1 10*3/uL (ref 0.0–0.5)
Eosinophils Relative: 1 %
HCT: 28.4 % — ABNORMAL LOW (ref 36.0–46.0)
Hemoglobin: 8.8 g/dL — ABNORMAL LOW (ref 12.0–15.0)
Immature Granulocytes: 1 %
Lymphocytes Relative: 14 %
Lymphs Abs: 1.1 10*3/uL (ref 0.7–4.0)
MCH: 28 pg (ref 26.0–34.0)
MCHC: 31 g/dL (ref 30.0–36.0)
MCV: 90.4 fL (ref 80.0–100.0)
Monocytes Absolute: 1 10*3/uL (ref 0.1–1.0)
Monocytes Relative: 13 %
Neutro Abs: 5.3 10*3/uL (ref 1.7–7.7)
Neutrophils Relative %: 71 %
Platelets: 205 10*3/uL (ref 150–400)
RBC: 3.14 MIL/uL — ABNORMAL LOW (ref 3.87–5.11)
RDW: 16.4 % — ABNORMAL HIGH (ref 11.5–15.5)
WBC: 7.6 10*3/uL (ref 4.0–10.5)
nRBC: 0 % (ref 0.0–0.2)

## 2022-11-04 LAB — PHOSPHORUS: Phosphorus: 2.3 mg/dL — ABNORMAL LOW (ref 2.5–4.6)

## 2022-11-04 LAB — MAGNESIUM: Magnesium: 2 mg/dL (ref 1.7–2.4)

## 2022-11-04 MED ORDER — FLUCONAZOLE IN SODIUM CHLORIDE 200-0.9 MG/100ML-% IV SOLN
200.0000 mg | INTRAVENOUS | Status: DC
  Administered 2022-11-04: 200 mg via INTRAVENOUS
  Filled 2022-11-04 (×2): qty 100

## 2022-11-04 MED ORDER — FENTANYL CITRATE PF 50 MCG/ML IJ SOSY
6.2500 ug | PREFILLED_SYRINGE | Freq: Once | INTRAMUSCULAR | Status: AC
  Administered 2022-11-04: 6.5 ug via INTRAVENOUS
  Filled 2022-11-04: qty 1

## 2022-11-04 MED ORDER — GERHARDT'S BUTT CREAM
TOPICAL_CREAM | Freq: Two times a day (BID) | CUTANEOUS | Status: DC
  Administered 2022-11-04: 1 via TOPICAL
  Filled 2022-11-04: qty 1

## 2022-11-04 NOTE — Care Management Important Message (Signed)
Important Message  Patient Details IM Letter given. Name: Cynthia Velasquez MRN: [id number] Date of Birth: 1951/05/09   Medicare Important Message Given:  Yes     Kerin Salen 11/04/2022, 11:20 AM

## 2022-11-04 NOTE — Progress Notes (Signed)
IVT rounds - patient due for [facility] deaccess/reaccess + dressing change. Per Dr. Sherral Hammers note from 2/18, possible discharge today 2/19. Spoke with primary RN and asked for IVT consult to be placed should patient end up remaining admitted for dressing change.   Cynthia Velasquez Lorita Officer, RN

## 2022-11-04 NOTE — Plan of Care (Signed)
  Problem: Safety: Goal: Ability to remain free from injury will improve Outcome: Progressing   Problem: Pain Managment: Goal: General experience of comfort will improve Outcome: Progressing   

## 2022-11-04 NOTE — Progress Notes (Signed)
PROGRESS NOTE    Cynthia Velasquez  [id number] DOB: 08/12/1951 DOA: 10/28/2022 PCP: Kathyrn Lass, MD     Brief Narrative:  72 y.o. BF PMHx Anal cancer (Dx 06/2019), anxiety, depression, osteoarthritis, LEFT  breast cancer (Stage Iia, Dx 08/2013), gallstones, GERD, hiatal hernia, migraine headaches, hyperlipidemia, hypertension, peripheral neuropathy, prediabetes who was sent from the cancer center due to diarrhea for the past 3 weeks.   Upon evaluation at the cancer center on 2/9 for her ongoing symptoms, stool studies were obtained and came back positive for Sapovirus.  Due to patient's severe unrelenting symptoms patient presented to [facility] emergency department for evaluation from the cancer center.   Upon evaluation in the cancer center patient was found to be hypokalemic and volume depleted.  The hospitalist group was called to assess the patient for admission to the hospital.   Patient was also identified to have Klebsiella pneumoniae urinary tract infection based on urine culture from 2/8. NOTE patient last Colonoscopy 12/07/2021  Subjective: 2/19 A/O x 4, negative nausea, negative vomiting, negative diarrhea.  Patient complaining of perianal discomfort, itching.   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 how long would you treat for this virus? -2/14 stool osmotic gap: Stool Osm, (stool NA /K) pending - 2/14 fecal fat pending - 2/14 fecal lactoferrin negative - 2/14 occult blood pending -2/14 TSH pending - 2/14 n.p.o. -2/14 normal saline+  KCl 20 meq/L 154m/hr -Lomotil 2.5-0.025 mg - 2/14 increase Florastor 250 mg TID -2/14 if unable to control diarrhea/diagnose additional cause consult GI colonoscopy? -2/15 Octreotide 1090m SQ TID -2/15 unable to obtain Dx/control of watery diarrhea in next 48 hours will consult GI, request colonoscopy. Dr. KaIrene Limboncology concurs -2/15 discuss case with Dr. VaTommy MedalD and Acyclovir would have no effect on Sapovirus, okay to discontinue.Dr. KaIrene Limboncology concurs -2/16 patient without diarrhea overnight and abdomen no longer tender.  Advance diet to full liquid. -2/17 patient tolerated full liquid diet overnight.  Advance to bland diet -2/18 nursing to allow patient to administer her own octreotide SQ overnight. -2/18 will discharge with octreotide Sq, allow Dr. KaIrene Limboncology to obtain p.o. version as our pharmacy does not stock.  None anion gap metabolic acidosis - 2/123456x amp sodium bicarb -2/17 resolved  Positive Klebsiella UTI -2/14 complete 5-day course antibiotics.  May have to extend given patient's immunosuppression -2/15 discussed case with Dr. VaTommy MedalD cleared to stop antibiotic after 3 days, unless symptomatic. -2/18 RESOLVED  Pancytopenia -Secondary to chemotherapy.  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  Last seen on 2/9 -Per oncology note 2/9  Last treatment:  Day 2   Cycle 6 on 10/23/22 -Last day of etopside was held 2/2 diarrhea so patient has completed course of chemo.  -2/14 will consult oncology in a.m. given that patient has completed chemo 7 days ago could still be having effect on her having diarrhea? -2/15 discussed case at length with Dr. KaIrene Limboncology who stated given that patient had  XRT 2023+ chemotherapy could be causing/contributing to patient's diarrhea.  Chest pain -2/14 most likely secondary to patient's significant electrolyte abnormalities - 2/14  when compared  to 10/28/22 no Significant change -2/14 trend troponin  Latest Reference Range & Units 10/30/22 17:57 10/31/22 10:18  Troponin I (High Sensitivity) <18 ng/L 3 3  -2/15 resolved most likely secondary to patient's significant electrolyte abnormality  Vaginal Candidiasis -2/19 Diflucan IV 200 mg, then 100 mg daily x 14 days  Essential hypertension -Losartan 100 mg daily   GERD without esophagitis -2/17 no symptoms with resumption of diet  Hypokalemia - Potassium goal>4 -2/14 see diarrhea -2/15 sodium Phosphate 30 mmol -2/16 Potassium IV 60 mEq -2/18 Potassium IV 60 mEq  Hypophosphatemia -Phosphorus goal>2.5  Hypomagnesmia -Magnesium goal>2 - 2/15 magnesium IV 2 g -2/18 Magnesium IV 2 g  Obesity (BMI 34.6 kg/m.) -Discuss with PCP        Mobility Assessment (last 72 hours)     Mobility Assessment     Row Name 11/03/22 2248 11/03/22 1202 11/02/22 2046 11/02/22 1100 11/01/22 2040   Does patient have an order for bedrest or is patient medically unstable No - Continue assessment No - Continue assessment No - Continue assessment No - Continue assessment No - Continue assessment   What is the highest level of mobility based on the progressive mobility assessment? Level 5 (Walks with assist in room/hall) - Balance while stepping forward/back and can walk in room with assist - Complete Level 5 (Walks with assist in room/hall) - Balance while stepping forward/back and can walk in room with assist - Complete Level 5 (Walks with assist in room/hall) - Balance while stepping forward/back and can walk in room with assist - Complete Level 5 (Walks with assist in room/hall) - Balance while stepping forward/back and can walk in room with assist - Complete Level 5 (Walks with assist in room/hall) - Balance while stepping forward/back and can walk in room with assist - Complete    Row Name 11/01/22 1300           Does patient have an order for bedrest or is patient medically unstable No -  Continue assessment                      DVT prophylaxis: Subcu heparin Code Status:  Family Communication:  Status is: Inpatient    Dispo: The patient is from: Home              Anticipated d/c is to: Home              Anticipated d/c date is: 1 day              Patient currently is not medically stable to d/c.      Consultants:  Dr. Irene Limbo oncology   Procedures/Significant Events:  2/13 CT Abd and Pelvis w/contrast; No evidence of bowel obstruction or free air. 2. Air-fluid levels in the colon, compatible with history of diarrhea. 3. Small hiatal hernia. 4. Aortic atherosclerosis. 5. 3 mm subpleural nodule in the right lower lobe. No follow-up needed if patient is low-risk.    I have personally reviewed and interpreted all radiology studies and my findings are as above.  VENTILATOR SETTINGS:    Cultures 2/8 stool positive Sapovirus 2/8 urine positive Klebsiella    Antimicrobials: Anti-infectives (From admission, onward)    Start     Ordered Stop   10/29/22 1100  cefTRIAXone (ROCEPHIN) 1 g in sodium chloride 0.9 %  100 mL IVPB        10/29/22 0938 11/02/22 2359   10/28/22 1915  acyclovir (ZOVIRAX) tablet 400 mg  Status:  Discontinued        10/28/22 1828 10/31/22 1517           Devices    LINES / TUBES:      Continuous Infusions:  fluconazole (DIFLUCAN) IV       Objective: Vitals:   11/02/22 1350 11/02/22 2036 11/03/22 2047 11/04/22 0830  BP: 132/84 110/78 135/84 127/87  Pulse: 89 81 84 81  Resp: 17 18 18 20  $ Temp: (!) 97.5 F (36.4 C) 98.5 F (36.9 C) 99 F (37.2 C) 98.8 F (37.1 C)  TempSrc: Oral Oral Oral Oral  SpO2:   95% 95%  Weight:      Height:        Intake/Output Summary (Last 24 hours) at 11/04/2022 1244 Last data filed at 11/04/2022 0600 Gross per 24 hour  Intake 50 ml  Output --  Net 50 ml   Filed Weights   10/28/22 1200 10/28/22 1225  Weight: 94.3 kg 94.3 kg   Physical Exam:  General: A/O x 4, No  acute respiratory distress Eyes: negative scleral hemorrhage, negative anisocoria, negative icterus ENT: Negative Runny nose, negative gingival bleeding, Neck:  Negative scars, masses, torticollis, lymphadenopathy, JVD Lungs: Clear to auscultation bilaterally without wheezes or crackles Cardiovascular: Regular rate and rhythm without murmur gallop or rub normal S1 and S2 Abdomen: negative abdominal pain, nondistended, positive soft, bowel sounds, no rebound, no ascites, no appreciable mass Extremities: No significant cyanosis, clubbing, or edema bilateral lower extremities Skin: Negative rashes, lesions, ulcers Genitourinary: Thick whitish discharge consistent with Candida vaginitis Psychiatric:  Negative depression, negative anxiety, negative fatigue, negative mania  Central nervous system:  Cranial nerves II through XII intact, tongue/uvula midline, all extremities muscle strength 5/5, sensation intact throughout, negative dysarthria, negative expressive aphasia, negative receptive aphasia.      Data Reviewed: Care during the described time interval was provided by me .  I have reviewed this patient's available data, including medical history, events of note, physical examination, and all test results as part of my evaluation.  CBC: Recent Labs  Lab 10/31/22 0435 11/01/22 0441 11/02/22 0500 11/03/22 0800 11/04/22 0324  WBC 5.3 7.0 7.8 7.3 7.6  NEUTROABS 3.4 4.7 5.7 5.2 5.3  HGB 8.1* 8.5* 8.7* 9.6* 8.8*  HCT 26.8* 27.1* 27.8* 31.4* 28.4*  MCV 92.1 89.7 90.0 90.5 90.4  PLT 100* 117* 140* 204 [id number]   Basic Metabolic Panel: Recent Labs  Lab 10/30/22 1037 10/31/22 0435 11/01/22 0441 11/02/22 0500 11/03/22 0800 11/04/22 0324  NA 139 138 139 138 137  --   K 3.4* 3.7 3.2* 4.1 3.4*  --   CL 119* 116* 109 107 107  --   CO2 16* 17* 23 25 25  $ --   GLUCOSE 90 98 136* 124* 128*  --   BUN <5* <5* <5* <5* 6*  --   CREATININE 0.82 0.79 0.94 0.87 1.03*  --   CALCIUM 8.4* 8.2* 8.0* 8.3*  8.1*  --   MG 1.4* 1.9 2.3 2.0 1.8 2.0  PHOS 2.0* 2.4* 4.4 2.4* 2.6 2.3*   GFR: Estimated Creatinine Clearance: 56.9 mL/min (A) (by C-G formula based on SCr of 1.03 mg/dL (H)). Liver Function Tests: Recent Labs  Lab 10/30/22 1037 10/31/22 0435 11/01/22 0441 11/02/22 0500 11/03/22 0800  AST 21 17 17 17 18  $ ALT 70* 55* 43 33  27  ALKPHOS 72 77 84 90 105  BILITOT 0.5 0.5 0.4 0.3 0.7  PROT 5.7* 5.3* 5.2* 5.5* 6.5  ALBUMIN 2.7* 2.8* 2.6* 2.8* 2.9*   Recent Labs  Lab 10/28/22 1249  LIPASE 29   No results for input(s): "AMMONIA" in the last 168 hours. Coagulation Profile: No results for input(s): "INR", "PROTIME" in the last 168 hours. Cardiac Enzymes: No results for input(s): "CKTOTAL", "CKMB", "CKMBINDEX", "TROPONINI" in the last 168 hours. BNP (last 3 results) No results for input(s): "PROBNP" in the last 8760 hours. HbA1C: No results for input(s): "HGBA1C" in the last 72 hours. CBG: No results for input(s): "GLUCAP" in the last 168 hours. Lipid Profile: No results for input(s): "CHOL", "HDL", "LDLCALC", "TRIG", "CHOLHDL", "LDLDIRECT" in the last 72 hours. Thyroid Function Tests: No results for input(s): "TSH", "T4TOTAL", "FREET4", "T3FREE", "THYROIDAB" in the last 72 hours. Anemia Panel: No results for input(s): "VITAMINB12", "FOLATE", "FERRITIN", "TIBC", "IRON", "RETICCTPCT" in the last 72 hours. Sepsis Labs: No results for input(s): "PROCALCITON", "LATICACIDVEN" in the last 168 hours.  No results found for this or any previous visit (from the past 240 hour(s)).        Radiology Studies: No results found.      Scheduled Meds:  Chlorhexidine Gluconate Cloth  6 each Topical Daily   Gerhardt's butt cream   Topical BID   heparin  5,000 Units Subcutaneous Q8H   losartan  100 mg Oral Daily   octreotide  100 mcg Subcutaneous TID   pantoprazole  40 mg Oral Daily   saccharomyces boulardii  250 mg Oral TID   Continuous Infusions:  fluconazole (DIFLUCAN) IV        LOS: 5 days    Time spent:40 min    Kiely Cousar, Geraldo Docker, MD [facility]   If 7PM-7AM, please contact night-coverage 11/04/2022, 12:44 PM

## 2022-11-05 ENCOUNTER — Encounter: Payer: Self-pay | Admitting: Neurology

## 2022-11-05 DIAGNOSIS — E860 Dehydration: Secondary | ICD-10-CM | POA: Diagnosis not present

## 2022-11-05 DIAGNOSIS — A09 Infectious gastroenteritis and colitis, unspecified: Secondary | ICD-10-CM | POA: Diagnosis not present

## 2022-11-05 DIAGNOSIS — N390 Urinary tract infection, site not specified: Secondary | ICD-10-CM | POA: Diagnosis not present

## 2022-11-05 DIAGNOSIS — E876 Hypokalemia: Secondary | ICD-10-CM | POA: Diagnosis not present

## 2022-11-05 DIAGNOSIS — K6289 Other specified diseases of anus and rectum: Secondary | ICD-10-CM

## 2022-11-05 LAB — COMPREHENSIVE METABOLIC PANEL
ALT: 20 U/L (ref 0–44)
AST: 20 U/L (ref 15–41)
Albumin: 2.7 g/dL — ABNORMAL LOW (ref 3.5–5.0)
Alkaline Phosphatase: 80 U/L (ref 38–126)
Anion gap: 8 (ref 5–15)
BUN: 13 mg/dL (ref 8–23)
CO2: 26 mmol/L (ref 22–32)
Calcium: 8.1 mg/dL — ABNORMAL LOW (ref 8.9–10.3)
Chloride: 103 mmol/L (ref 98–111)
Creatinine, Ser: 0.91 mg/dL (ref 0.44–1.00)
GFR, Estimated: >60 mL/min (ref >60)
Glucose, Bld: 120 mg/dL — ABNORMAL HIGH (ref 70–99)
Potassium: 3.8 mmol/L (ref 3.5–5.1)
Sodium: 137 mmol/L (ref 135–145)
Total Bilirubin: 0.3 mg/dL (ref 0.3–1.2)
Total Protein: 5.4 g/dL — ABNORMAL LOW (ref 6.5–8.1)

## 2022-11-05 LAB — CBC WITH DIFFERENTIAL/PLATELET
Abs Immature Granulocytes: 0.06 10*3/uL (ref 0.00–0.07)
Basophils Absolute: 0 10*3/uL (ref 0.0–0.1)
Basophils Relative: 1 %
Eosinophils Absolute: 0.1 10*3/uL (ref 0.0–0.5)
Eosinophils Relative: 2 %
HCT: 28.8 % — ABNORMAL LOW (ref 36.0–46.0)
Hemoglobin: 8.8 g/dL — ABNORMAL LOW (ref 12.0–15.0)
Immature Granulocytes: 1 %
Lymphocytes Relative: 22 %
Lymphs Abs: 1.4 10*3/uL (ref 0.7–4.0)
MCH: 27.9 pg (ref 26.0–34.0)
MCHC: 30.6 g/dL (ref 30.0–36.0)
MCV: 91.4 fL (ref 80.0–100.0)
Monocytes Absolute: 0.8 10*3/uL (ref 0.1–1.0)
Monocytes Relative: 12 %
Neutro Abs: 4 10*3/uL (ref 1.7–7.7)
Neutrophils Relative %: 62 %
Platelets: 211 10*3/uL (ref 150–400)
RBC: 3.15 MIL/uL — ABNORMAL LOW (ref 3.87–5.11)
RDW: 16.1 % — ABNORMAL HIGH (ref 11.5–15.5)
WBC: 6.3 10*3/uL (ref 4.0–10.5)
nRBC: 0 % (ref 0.0–0.2)

## 2022-11-05 LAB — PHOSPHORUS: Phosphorus: 3.3 mg/dL (ref 2.5–4.6)

## 2022-11-05 LAB — MAGNESIUM: Magnesium: 1.8 mg/dL (ref 1.7–2.4)

## 2022-11-05 MED ORDER — DIPHENOXYLATE-ATROPINE 2.5-0.025 MG PO TABS: 2.0000 | ORAL_TABLET | Freq: Four times a day (QID) | ORAL | 0 refills | Status: DC | PRN

## 2022-11-05 MED ORDER — OCTREOTIDE ACETATE 100 MCG/ML IJ SOLN: 100.0000 ug | Freq: Three times a day (TID) | INTRAMUSCULAR | 0 refills | Status: DC | PRN

## 2022-11-05 MED ORDER — FLUCONAZOLE 100MG IVPB: 100.0000 mg | INTRAVENOUS | Status: DC

## 2022-11-05 MED ORDER — LIDOCAINE 4 % EX CREA
TOPICAL_CREAM | Freq: Two times a day (BID) | CUTANEOUS | Status: DC | PRN
  Filled 2022-11-05: qty 5

## 2022-11-05 MED ORDER — PANTOPRAZOLE SODIUM 40 MG PO TBEC: 40.0000 mg | DELAYED_RELEASE_TABLET | Freq: Every day | ORAL | 0 refills | Status: DC

## 2022-11-05 MED ORDER — GERHARDT'S BUTT CREAM: 1.0000 | TOPICAL_CREAM | Freq: Two times a day (BID) | CUTANEOUS | 0 refills | Status: DC

## 2022-11-05 MED ORDER — FLUCONAZOLE IN SODIUM CHLORIDE 200-0.9 MG/100ML-% IV SOLN
200.0000 mg | INTRAVENOUS | Status: AC
  Administered 2022-11-05: 200 mg via INTRAVENOUS
  Filled 2022-11-05: qty 100

## 2022-11-05 MED ORDER — HEPARIN SOD (PORK) LOCK FLUSH 100 UNIT/ML IV SOLN
500.0000 [IU] | INTRAVENOUS | Status: AC | PRN
  Administered 2022-11-05: 500 [IU]

## 2022-11-05 MED ORDER — LIDOCAINE 4 % EX CREA: TOPICAL_CREAM | Freq: Two times a day (BID) | CUTANEOUS | 0 refills | Status: DC | PRN

## 2022-11-05 MED ORDER — SACCHAROMYCES BOULARDII 250 MG PO CAPS: 250.0000 mg | ORAL_CAPSULE | Freq: Three times a day (TID) | ORAL | 0 refills | Status: DC

## 2022-11-05 MED ORDER — FLUCONAZOLE 100 MG PO TABS: 100.0000 mg | ORAL_TABLET | Freq: Every day | ORAL | 0 refills | Status: AC

## 2022-11-05 NOTE — Progress Notes (Signed)
Discharge instructions provided to pt and questions answered to her satisfaction.  IV Team nurse deaccessed pt's port-a-cath.  Pt taken to discharge area via wheelchair.

## 2022-11-05 NOTE — Discharge Summary (Signed)
Physician Discharge Summary  Cynthia Velasquez [id number] DOB: 1951/07/15 DOA: 10/28/2022  PCP: Kathyrn Lass, MD  Admit date: 10/28/2022 Discharge date: 11/05/2022  Time spent: 30 minutes  Recommendations for Outpatient Follow-up: Chronic infectious diarrhea (diarrhea> 3 weeks) -2/14 continue current antiviral, contact ID in a.m. how long would you treat for this virus? -2/14 stool osmotic gap: Stool Osm, (stool NA /K) pending - 2/14 fecal fat pending - 2/14 fecal lactoferrin negative - 2/14 occult blood pending -2/14 TSH pending - 2/14 n.p.o. -2/14 normal saline+ KCl 20 meq/L 137m/hr -Lomotil 2.5-0.025 mg - 2/14 increase Florastor 250 mg TID -2/14 if unable to control diarrhea/diagnose additional cause consult GI colonoscopy? -2/15 Octreotide 1024m SQ TID -2/15 unable to obtain Dx/control of watery diarrhea in next 48 hours will consult GI, request colonoscopy. Dr. KaIrene Limboncology concurs -2/15 discuss case with Dr. VaTommy MedalD and Acyclovir would have no effect on Sapovirus, okay to discontinue.Dr. KaIrene Limboncology concurs -2/16 patient without diarrhea overnight and abdomen no longer tender.  Advance diet to full liquid. -2/17 patient tolerated full liquid diet overnight.  Advance to bland diet -2/18 nursing to allow patient to administer her own octreotide SQ overnight. -2/18 will discharge with octreotide Sq, allow Dr. KaIrene Limboncology to obtain p.o. version as our pharmacy does not stock.   None anion gap metabolic acidosis - 2/[id number] amp sodium bicarb -2/17 resolved   Positive Klebsiella UTI -2/14 complete 5-day course antibiotics.  May have to extend given patient's immunosuppression -2/15 discussed case with Dr. VaTommy MedalD cleared to stop antibiotic after 3 days, unless symptomatic. -2/18 RESOLVED   Pancytopenia -Secondary to chemotherapy.  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  Last seen  on 2/9 -Per oncology note 2/9  Last treatment:  Day 2   Cycle 6 on 10/23/22 -Last day of etopside was held 2/2 diarrhea so patient has completed course of chemo.  -2/14 will consult oncology in a.m. given that patient has completed chemo 7 days ago could still be having effect on her having diarrhea? -2/15 discussed case at length with Dr. KaIrene Limboncology who stated given that patient had XRT 2023+ chemotherapy could be causing/contributing to patient's diarrhea.   Chest pain -2/14 most likely secondary to patient's significant electrolyte abnormalities - 2/14  when compared to 10/28/22 no Significant change -2/14 trend troponin    Latest Reference Range & Units 10/30/22 17:57 10/31/22 10:18  Troponin I (High Sensitivity) <18 ng/L 3 3  -2/15 resolved most likely secondary to patient's significant electrolyte abnormality   Vaginal Candidiasis -2/19 Diflucan IV 200 mg x 2 doses, then 100 mg daily x 8 days for total of 10 days  Perianal pain - Secondary to frequent diarrhea and candidiasis. - Lidocaine cream  BID - Gerhardt's Butt Cream    Essential hypertension -Losartan 100 mg daily   GERD without esophagitis -2/17 no symptoms with resumption of diet   Hypokalemia - Potassium goal>4 -2/14 see diarrhea -2/15 sodium Phosphate 30 mmol -2/16 Potassium IV 60 mEq -2/18 Potassium IV 60 mEq   Hypophosphatemia -Phosphorus goal>2.5   Hypomagnesmia -Magnesium goal>2 - 2/15 magnesium IV 2 g -2/18 Magnesium IV 2 g   Obesity (BMI 34.6 kg/m.) -Discuss with PCP    Discharge Diagnoses:  Principal Problem:   Acute infectious diarrhea Active Problems:   UTI due to Klebsiella species   Hypokalemia   [place]top[place]  Peripheral T cell  lymphoma of lymph nodes of multiple sites [facility])   Essential hypertension   GERD without esophagitis   Hyperglycemia   Pure hypercholesterolemia   Vaginal candidiasis   Perianal pain   Discharge Condition: Stable  Diet recommendation:  Bland diet  Filed Weights   10/28/22 1200 10/28/22 1225  Weight: 94.3 kg 94.3 kg    History of present illness:  72 y.o. BF PMHx Anal cancer (Dx 06/2019), anxiety, depression, osteoarthritis, LEFT  breast cancer (Stage Iia, Dx 08/2013), gallstones, GERD, hiatal hernia, migraine headaches, hyperlipidemia, hypertension, peripheral neuropathy, prediabetes who was sent from the cancer center due to diarrhea for the past 3 weeks.   Upon evaluation at the cancer center on 2/9 for her ongoing symptoms, stool studies were obtained and came back positive for Sapovirus.  Due to patient's severe unrelenting symptoms patient presented to [facility] emergency department for evaluation from the cancer center.   Upon evaluation in the cancer center patient was found to be hypokalemic and volume depleted.  The hospitalist group was called to assess the patient for admission to the hospital.   Patient was also identified to have Klebsiella pneumoniae urinary tract infection based on urine culture from 2/8. NOTE patient last Colonoscopy 12/07/2021  Hospital Course:  See above  Procedures: 2/13 CT Abd and Pelvis w/contrast; No evidence of bowel obstruction or free air. 2. Air-fluid levels in the colon, compatible with history of diarrhea. 3. Small hiatal hernia. 4. Aortic atherosclerosis. 5. 3 mm subpleural nodule in the right lower lobe. No follow-up needed if patient is low-risk.  Consultations: Dr. Irene Limbo oncology   Cultures  2/8 stool positive Sapovirus 2/8 urine positive Klebsiella  Antibiotics Anti-infectives (From admission, onward)    Start     Ordered Stop   11/05/22 1330  fluconazole (DIFLUCAN) IVPB 100 mg  Status:  Discontinued        11/05/22 1231 11/05/22 1237   11/05/22 1245  fluconazole (DIFLUCAN) IVPB 200 mg        11/05/22 1239 11/05/22 1340   11/05/22 0000  fluconazole (DIFLUCAN) 100 MG tablet        11/05/22 1301 12/05/22 2359   11/04/22 1400  fluconazole  (DIFLUCAN) IVPB 200 mg  Status:  Discontinued        11/04/22 1242 11/05/22 1231   10/29/22 1100  cefTRIAXone (ROCEPHIN) 1 g in sodium chloride 0.9 % 100 mL IVPB        10/29/22 0938 11/02/22 1209   10/28/22 1915  acyclovir (ZOVIRAX) tablet 400 mg  Status:  Discontinued        10/28/22 1828 10/31/22 1517         Discharge Exam: Vitals:   11/04/22 1630 11/04/22 2053 11/05/22 0403 11/05/22 1351  BP: (!) 127/96 121/70 110/72 119/72  Pulse: 85 81 73 76  Resp: 18 18 18 17  $ Temp: 98.6 F (37 C) 98.5 F (36.9 C) 97.9 F (36.6 C) 98 F (36.7 C)  TempSrc: Oral   Oral  SpO2: 97% 96% 92% 100%  Weight:      Height:        General: A/O x 4, No acute respiratory distress Eyes: negative scleral hemorrhage, negative anisocoria, negative icterus ENT: Negative Runny nose, negative gingival bleeding, Neck:  Negative scars, masses, torticollis, lymphadenopathy, JVD Lungs: Clear to auscultation bilaterally without wheezes or crackles Cardiovascular: Regular rate and rhythm without murmur gallop or rub normal S1 and S  Discharge Instructions   Allergies as of 11/05/2022  Reactions   Tramadol Itching, Rash   Codeine Itching, Rash   Vicodin [hydrocodone-acetaminophen] Itching, Rash        Medication List     STOP taking these medications    acetaminophen 500 MG tablet Commonly known as: TYLENOL   famotidine 20 MG tablet Commonly known as: PEPCID   nitrofurantoin (macrocrystal-monohydrate) 100 MG capsule Commonly known as: Macrobid   potassium chloride SA 20 MEQ tablet Commonly known as: KLOR-CON M   predniSONE 20 MG tablet Commonly known as: DELTASONE   Saccharomyces boulardii 500 MG Pack Replaced by: saccharomyces boulardii 250 MG capsule   senna-docusate 8.6-50 MG tablet Commonly known as: Senna S       TAKE these medications    acyclovir 400 MG tablet Commonly known as: ZOVIRAX Take 1 tablet (400 mg total) by mouth daily.    aspirin-acetaminophen-caffeine 250-250-65 MG tablet Commonly known as: EXCEDRIN MIGRAINE Take 2 tablets by mouth every 6 (six) hours as needed for headache.   baclofen 10 MG tablet Commonly known as: LIORESAL Take 1 tablet (10 mg total) by mouth 3 (three) times daily as needed for muscle spasms.   diphenoxylate-atropine 2.5-0.025 MG tablet Commonly known as: LOMOTIL Take 2 tablets by mouth 4 (four) times daily as needed for diarrhea or loose stools.   DULoxetine 30 MG capsule Commonly known as: Cymbalta Take 2 capsules (60 mg total) by mouth at bedtime. Take 1 capsule (30 mg) at bedtime for 1 week, if no side effects, then increase to 2 capsules (60 mg) thereafter.   fluconazole 100 MG tablet Commonly known as: Diflucan Take 1 tablet (100 mg total) by mouth daily.   Gerhardt's butt cream Crea Apply 1 Application topically 2 (two) times daily.   HYDROcodone-acetaminophen 5-325 MG tablet Commonly known as: Norco Take 1 tablet by mouth every 6 (six) hours as needed for moderate pain.   lidocaine 4 % cream Commonly known as: LMX Apply topically 2 (two) times daily as needed (pain).   lidocaine-prilocaine cream Commonly known as: EMLA Apply to affected area once   loratadine 10 MG tablet Commonly known as: CLARITIN TAKE 1 TABLET(10 MG) BY MOUTH DAILY What changed: See the new instructions.   losartan 100 MG tablet Commonly known as: COZAAR Take 100 mg by mouth daily.   nystatin powder Commonly known as: MYCOSTATIN/NYSTOP Apply 1 Application topically 3 (three) times daily. What changed:  when to take this reasons to take this   octreotide 100 MCG/ML Soln injection Commonly known as: SANDOSTATIN Inject 1 mL (100 mcg total) into the skin 3 (three) times daily as needed (Diarrhea).   ondansetron 8 MG tablet Commonly known as: Zofran Take 1 tablet (8 mg total) by mouth every 8 (eight) hours as needed for nausea or vomiting. Start on the third day after  cyclophosphamide chemotherapy.   pantoprazole 40 MG tablet Commonly known as: PROTONIX Take 1 tablet (40 mg total) by mouth daily. Start taking on: November 06, 2022   prochlorperazine 10 MG tablet Commonly known as: COMPAZINE Take 1 tablet (10 mg total) by mouth every 6 (six) hours as needed for nausea or vomiting.   saccharomyces boulardii 250 MG capsule Commonly known as: FLORASTOR Take 1 capsule (250 mg total) by mouth 3 (three) times daily. Replaces: Saccharomyces boulardii 500 MG Pack   SALONPAS PAIN RELIEF PATCH EX Apply 1 patch topically daily as needed (pain).       Allergies  Allergen Reactions   Tramadol Itching and Rash   Codeine Itching and  Rash   Vicodin [Hydrocodone-Acetaminophen] Itching and Rash      The results of significant diagnostics from this hospitalization (including imaging, microbiology, ancillary and laboratory) are listed below for reference.    Significant Diagnostic Studies: DG CHEST PORT 1 VIEW  Result Date: 10/30/2022 CLINICAL DATA:  Chest pain. EXAM: PORTABLE CHEST 1 VIEW COMPARISON:  April 07, 2022. FINDINGS: The heart size and mediastinal contours are within normal limits. Both lungs are clear. Right internal jugular Port-A-Cath is noted with tip in expected position of cavoatrial junction. Status post right shoulder arthroplasty. IMPRESSION: No active disease. Electronically Signed   By: Marijo Conception M.D.   On: 10/30/2022 17:50   CT ABDOMEN PELVIS W CONTRAST  Result Date: 10/29/2022 CLINICAL DATA:  Abdominal pain. Diffuse abdominal tenderness with severe diarrhea. EXAM: CT ABDOMEN AND PELVIS WITH CONTRAST TECHNIQUE: Multidetector CT imaging of the abdomen and pelvis was performed using the standard protocol following bolus administration of intravenous contrast. RADIATION DOSE REDUCTION: This exam was performed according to the departmental dose-optimization program which includes automated exposure control, adjustment of the mA and/or kV  according to patient size and/or use of iterative reconstruction technique. CONTRAST:  35m OMNIPAQUE IOHEXOL 300 MG/ML  SOLN COMPARISON:  08/07/2022. FINDINGS: Lower chest: Atelectasis are present at the lung bases. There is a 3 mm subpleural nodule in the right lower lobe, axial image 19. Hepatobiliary: No focal liver abnormality is seen. Status post cholecystectomy. No biliary dilatation. Pancreas: Unremarkable. No pancreatic ductal dilatation or surrounding inflammatory changes. Spleen: Normal in size without focal abnormality. Adrenals/Urinary Tract: The adrenal glands are within normal limits. Cysts are present in the kidneys bilaterally. No renal calculus or hydronephrosis. The bladder is not seen due to streak hardware artifact. Stomach/Bowel: There is a small hiatal hernia. Stomach is within normal limits. Appendix appears normal. No evidence of bowel wall thickening, distention, or inflammatory changes. No free air or pneumatosis. Air-fluid levels are present in the colon, compatible with history of diarrhea. Vascular/Lymphatic: Aortic atherosclerosis. No enlarged abdominal or pelvic lymph nodes. Reproductive: Status post hysterectomy. No adnexal masses. Other: No abdominopelvic ascites. Multifocal fat containing hernias are noted in the anterior abdominal wall in the midline. Musculoskeletal: Total hip arthroplasty changes are noted bilaterally. Degenerative changes are present in the thoracolumbar spine. No acute osseous abnormality. IMPRESSION: 1. No evidence of bowel obstruction or free air. 2. Air-fluid levels in the colon, compatible with history of diarrhea. 3. Small hiatal hernia. 4. Aortic atherosclerosis. 5. 3 mm subpleural nodule in the right lower lobe. No follow-up needed if patient is low-risk.This recommendation follows the consensus statement: Guidelines for Management of Incidental Pulmonary Nodules Detected on CT Images: From the Fleischner Society 2017; Radiology 2017; 284:228-243.  Electronically Signed   By: LBrett FairyM.D.   On: 10/29/2022 21:49    Microbiology: No results found for this or any previous visit (from the past 240 hour(s)).   Labs: Basic Metabolic Panel: Recent Labs  Lab 10/31/22 0435 11/01/22 0441 11/02/22 0500 11/03/22 0800 11/04/22 0324 11/05/22 0341  NA 138 139 138 137  --  137  K 3.7 3.2* 4.1 3.4*  --  3.8  CL 116* 109 107 107  --  103  CO2 17* 23 25 25  $ --  26  GLUCOSE 98 136* 124* 128*  --  120*  BUN <5* <5* <5* 6*  --  13  CREATININE 0.79 0.94 0.87 1.03*  --  0.91  CALCIUM 8.2* 8.0* 8.3* 8.1*  --  8.1*  MG 1.9 2.3 2.0 1.8 2.0 1.8  PHOS 2.4* 4.4 2.4* 2.6 2.3* 3.3   Liver Function Tests: Recent Labs  Lab 10/31/22 0435 11/01/22 0441 11/02/22 0500 11/03/22 0800 11/05/22 0341  AST 17 17 17 18 20  $ ALT 55* 43 33 27 20  ALKPHOS 77 84 90 105 80  BILITOT 0.5 0.4 0.3 0.7 0.3  PROT 5.3* 5.2* 5.5* 6.5 5.4*  ALBUMIN 2.8* 2.6* 2.8* 2.9* 2.7*   No results for input(s): "LIPASE", "AMYLASE" in the last 168 hours. No results for input(s): "AMMONIA" in the last 168 hours. CBC: Recent Labs  Lab 11/01/22 0441 11/02/22 0500 11/03/22 0800 11/04/22 0324 11/05/22 0341  WBC 7.0 7.8 7.3 7.6 6.3  NEUTROABS 4.7 5.7 5.2 5.3 4.0  HGB 8.5* 8.7* 9.6* 8.8* 8.8*  HCT 27.1* 27.8* 31.4* 28.4* 28.8*  MCV 89.7 90.0 90.5 90.4 91.4  PLT 117* 140* 204 205 211   Cardiac Enzymes: No results for input(s): "CKTOTAL", "CKMB", "CKMBINDEX", "TROPONINI" in the last 168 hours. BNP: BNP (last 3 results) No results for input(s): "BNP" in the last 8760 hours.  ProBNP (last 3 results) No results for input(s): "PROBNP" in the last 8760 hours.  CBG: No results for input(s): "GLUCAP" in the last 168 hours.     Signed:  Dia Crawford, MD [facility]

## 2022-11-08 ENCOUNTER — Telehealth: Payer: Self-pay | Admitting: Hematology

## 2022-11-08 NOTE — Telephone Encounter (Signed)
Called patient per 2/6 los notes to schedule f/u. Left voicemail with new appointment information and contact details if needing to reschedule.

## 2022-11-11 DIAGNOSIS — F329 Major depressive disorder, single episode, unspecified: Secondary | ICD-10-CM | POA: Diagnosis not present

## 2022-11-11 DIAGNOSIS — C8449 Peripheral T-cell lymphoma, not classified, extranodal and solid organ sites: Secondary | ICD-10-CM | POA: Diagnosis not present

## 2022-11-11 DIAGNOSIS — Z6835 Body mass index (BMI) 35.0-35.9, adult: Secondary | ICD-10-CM | POA: Diagnosis not present

## 2022-11-11 DIAGNOSIS — K219 Gastro-esophageal reflux disease without esophagitis: Secondary | ICD-10-CM | POA: Diagnosis not present

## 2022-11-11 DIAGNOSIS — R7303 Prediabetes: Secondary | ICD-10-CM | POA: Diagnosis not present

## 2022-11-11 DIAGNOSIS — R21 Rash and other nonspecific skin eruption: Secondary | ICD-10-CM | POA: Diagnosis not present

## 2022-11-11 DIAGNOSIS — E876 Hypokalemia: Secondary | ICD-10-CM | POA: Diagnosis not present

## 2022-11-11 DIAGNOSIS — I700 Atherosclerosis of aorta: Secondary | ICD-10-CM | POA: Diagnosis not present

## 2022-11-11 DIAGNOSIS — I10 Essential (primary) hypertension: Secondary | ICD-10-CM | POA: Diagnosis not present

## 2022-11-12 ENCOUNTER — Encounter: Payer: Self-pay | Admitting: Neurology

## 2022-11-12 ENCOUNTER — Encounter: Payer: Self-pay | Admitting: Hematology

## 2022-11-14 DIAGNOSIS — R2689 Other abnormalities of gait and mobility: Secondary | ICD-10-CM | POA: Diagnosis not present

## 2022-11-14 DIAGNOSIS — M6281 Muscle weakness (generalized): Secondary | ICD-10-CM | POA: Diagnosis not present

## 2022-11-18 ENCOUNTER — Other Ambulatory Visit: Payer: Self-pay

## 2022-11-18 DIAGNOSIS — C8448 Peripheral T-cell lymphoma, not classified, lymph nodes of multiple sites: Secondary | ICD-10-CM

## 2022-11-20 ENCOUNTER — Inpatient Hospital Stay: Payer: PPO | Attending: Hematology | Admitting: Hematology

## 2022-11-20 ENCOUNTER — Inpatient Hospital Stay: Payer: PPO

## 2022-11-20 VITALS — BP 134/91 | HR 92 | Temp 97.7°F | Resp 18 | Wt 205.5 lb

## 2022-11-20 DIAGNOSIS — Z923 Personal history of irradiation: Secondary | ICD-10-CM | POA: Diagnosis not present

## 2022-11-20 DIAGNOSIS — C8448 Peripheral T-cell lymphoma, not classified, lymph nodes of multiple sites: Secondary | ICD-10-CM

## 2022-11-20 DIAGNOSIS — Z9013 Acquired absence of bilateral breasts and nipples: Secondary | ICD-10-CM | POA: Diagnosis not present

## 2022-11-20 DIAGNOSIS — R197 Diarrhea, unspecified: Secondary | ICD-10-CM | POA: Diagnosis not present

## 2022-11-20 DIAGNOSIS — G629 Polyneuropathy, unspecified: Secondary | ICD-10-CM | POA: Diagnosis not present

## 2022-11-20 DIAGNOSIS — Z853 Personal history of malignant neoplasm of breast: Secondary | ICD-10-CM | POA: Insufficient documentation

## 2022-11-20 DIAGNOSIS — Z9221 Personal history of antineoplastic chemotherapy: Secondary | ICD-10-CM | POA: Diagnosis not present

## 2022-11-20 DIAGNOSIS — K529 Noninfective gastroenteritis and colitis, unspecified: Secondary | ICD-10-CM | POA: Diagnosis not present

## 2022-11-20 DIAGNOSIS — C210 Malignant neoplasm of anus, unspecified: Secondary | ICD-10-CM | POA: Insufficient documentation

## 2022-11-20 LAB — CMP (CANCER CENTER ONLY)
ALT: 10 U/L (ref 0–44)
AST: 18 U/L (ref 15–41)
Albumin: 3.7 g/dL (ref 3.5–5.0)
Alkaline Phosphatase: 79 U/L (ref 38–126)
Anion gap: 6 (ref 5–15)
BUN: 11 mg/dL (ref 8–23)
CO2: 26 mmol/L (ref 22–32)
Calcium: 9.5 mg/dL (ref 8.9–10.3)
Chloride: 108 mmol/L (ref 98–111)
Creatinine: 0.97 mg/dL (ref 0.44–1.00)
GFR, Estimated: >60 mL/min (ref >60)
Glucose, Bld: 110 mg/dL — ABNORMAL HIGH (ref 70–99)
Potassium: 3.8 mmol/L (ref 3.5–5.1)
Sodium: 140 mmol/L (ref 135–145)
Total Bilirubin: 0.4 mg/dL (ref 0.3–1.2)
Total Protein: 7.2 g/dL (ref 6.5–8.1)

## 2022-11-20 LAB — CBC WITH DIFFERENTIAL (CANCER CENTER ONLY)
Abs Immature Granulocytes: 0.01 10*3/uL (ref 0.00–0.07)
Basophils Absolute: 0 10*3/uL (ref 0.0–0.1)
Basophils Relative: 1 %
Eosinophils Absolute: 0.1 10*3/uL (ref 0.0–0.5)
Eosinophils Relative: 3 %
HCT: 32.6 % — ABNORMAL LOW (ref 36.0–46.0)
Hemoglobin: 10.4 g/dL — ABNORMAL LOW (ref 12.0–15.0)
Immature Granulocytes: 0 %
Lymphocytes Relative: 38 %
Lymphs Abs: 1.7 10*3/uL (ref 0.7–4.0)
MCH: 28.3 pg (ref 26.0–34.0)
MCHC: 31.9 g/dL (ref 30.0–36.0)
MCV: 88.6 fL (ref 80.0–100.0)
Monocytes Absolute: 0.7 10*3/uL (ref 0.1–1.0)
Monocytes Relative: 15 %
Neutro Abs: 2 10*3/uL (ref 1.7–7.7)
Neutrophils Relative %: 43 %
Platelet Count: 248 10*3/uL (ref 150–400)
RBC: 3.68 MIL/uL — ABNORMAL LOW (ref 3.87–5.11)
RDW: 16.5 % — ABNORMAL HIGH (ref 11.5–15.5)
WBC Count: 4.6 10*3/uL (ref 4.0–10.5)
nRBC: 0 % (ref 0.0–0.2)

## 2022-11-20 LAB — LACTATE DEHYDROGENASE: LDH: 185 U/L (ref 98–192)

## 2022-11-20 NOTE — Progress Notes (Signed)
[place]   [phone] Fax:([phone]) [place] VISIT   Patient Care Team: Kathyrn Lass, MD as PCP - General (Family Medicine) Brunetta Genera, MD as PCP - Hematology/Oncology (Hematology) Wonda Horner, MD as Consulting Physician (Gastroenterology) Truitt Merle, MD as Consulting Physician (Hematology) Alla Feeling, NP as Nurse Practitioner (Nurse Practitioner) Kyung Rudd, MD as Consulting Physician (Radiation Oncology) Michael Boston, MD as Consulting Physician (General Surgery) Izora Gala, MD as Attending Physician (Otolaryngology)  Date of Service: 11/26/22    CHIEF COMPLAINT: f/u for evaluation and management of T cell lymphoma.  SUMMARY OF ONCOLOGIC HISTORY: Oncology History Overview Note  Cancer Staging Anal cancer ([place]) Staging form: Anus, AJCC 8th Edition - Clinical stage from 06/28/2019: Stage IIA (cT2, cN0, cM0) - Signed by Alla Feeling, NP on 06/28/2019  Breast cancer of upper-outer quadrant of left female breast [facility]) Staging form: Breast, AJCC 7th Edition - Clinical stage from 09/07/2013: Stage IIA (T2, N0, cM0) - Signed by [name], MD on 09/07/2013 - Pathologic: Stage IIA (T2, N0, cM0) - Signed by [name], MD on 09/07/2013  Breast cancer of upper-outer quadrant of right female breast [facility]) Staging form: Breast, AJCC 7th Edition - Clinical stage from 09/07/2013: Stage IA (T1a, N0, cM0) - Signed by [name], MD on 09/07/2013 - Pathologic: Stage IA (T1a, N0, cM0) - Signed by [name], MD on 09/07/2013    Breast cancer of upper-outer quadrant of left female breast ([place])  07/10/2007 Procedure   US biopsy showed invasive ductal carcinoma 0.8cm, Nottingham grade 3   07/30/2007 Surgery   She had left breast lumpectomy and LN biopsy which showed high grade invasive ductal cancer Nottingham grade 3, 2.7 cm, ER/PR/Her2 neu negative   11/24/2007 Surgery   Patient elected for bilateral  mastectomy   09/07/2008 - 03/08/2009 Chemotherapy   dates are approximate: she received adriamycin and cytoxan followed by Taxol   03/08/2009 - 05/08/2009 Radiation Therapy   dates are approximate, she received XRT    07/09/2021 Imaging   CT CAP  IMPRESSION: 1. No evidence to suggest locally recurrent ianorectal neoplasm or metastatic disease in the chest, abdomen or pelvis. 2. Status post bilateral modified radical mastectomy and bilateral axillary lymph node dissection. 3. Three small supraumbilical ventral hernias containing only omental fat. No associated bowel incarceration or obstruction at this time. 4. Aortic atherosclerosis. 5. Additional incidental findings, as above.   Breast cancer of upper-outer quadrant of right female breast ([place])  07/10/2006 Procedure   stereotactic biopsy showed atypical hyperplasia   10/23/2006 Surgery   right lumpectomy showed invasive ductal ca (Nottingham grade 1)  38m and DCIS, ER/PR positive Her 2 negative   10/09/2007 - 10/08/2012 Chemotherapy   Patient was placed on Tamoxifen   03/24/2017 Imaging   No acute findings. No evidence of recurrent carcinoma or metastatic disease   07/09/2021 Imaging   CT CAP  IMPRESSION: 1. No evidence to suggest locally recurrent ianorectal neoplasm or metastatic disease in the chest, abdomen or pelvis. 2. Status post bilateral modified radical mastectomy and bilateral axillary lymph node dissection. 3. Three small supraumbilical ventral hernias containing only omental fat. No associated bowel incarceration or obstruction at this time. 4. Aortic atherosclerosis. 5. Additional incidental findings, as above.   [place]  06/04/2019 Procedure   Colonoscopy per Dr. SAnson Fret Findings-the digital rectal exam revealed a 3 cm diameter firm rectal mass.  The mass was noncircumferential and located predominantly at  the right bowel wall at the anorectal junction. A nonobstructing mass was found at the  anus and in the rectum    06/04/2019 Initial Biopsy   Follow pathology: Large intestine, rectum biopsy: Invasive well to moderately differentiated squamous cell carcinoma.  No rectal mucosa present.  There is strong diffuse expression of P 16 immunostain.  CDX 2, p63 and mCEA immunostains are also used in the diagnostic work-up of the case.   06/04/2019 Initial Diagnosis   Anal cancer ([place])   06/21/2019 PET scan   IMPRESSION: 1. Anorectal primary. No hypermetabolic metastatic disease within the chest, abdomen, or pelvis. Perirectal nodes, at least 1 of which is new since 02/26/2018 CT, suspicious based on size and interval development. 2. Mild limitations secondary to beam hardening artifact from bilateral hip arthroplasty. 3.  Aortic Atherosclerosis (ICD10-I70.0).   06/28/2019 Cancer Staging   Staging form: Anus, AJCC 8th Edition - Clinical stage from 06/28/2019: Stage IIA (cT2, cN0, cM0) - Signed by Alla Feeling, NP on 06/28/2019   06/28/2019 - 07/26/2019 Chemotherapy   concurrent chemoRT with Mitomycin and 5FU on week 1 and week 5 on starting 06/28/19. Last dose on 07/26/19   06/28/2019 - 08/09/2019 Radiation Therapy   concurrent chemoRT with Dr [name] 06/28/19-08/09/19   11/01/2019 PET scan   IMPRESSION: 1. Marked interval decrease in hypermetabolism noted at the level of the anal rectal primary. No evidence for hypermetabolic metastatic disease in the chest, abdomen, or pelvis. The perirectal lymph nodes identified previously have resolved in the interval. 2.  Aortic Atherosclerois (ICD10-170.0)   08/07/2020 Imaging   CT CAP  IMPRESSION: 1. No findings for residual/recurrent anal tumor, regional lymphadenopathy or metastatic disease. 2. Status post cholecystectomy. No biliary dilatation. 3. Small anterior abdominal wall hernia containing fat. 4. Aortic atherosclerosis.   Aortic Atherosclerosis (ICD10-I70.0).     07/09/2021 Imaging   CT CAP  IMPRESSION: 1. No  evidence to suggest locally recurrent ianorectal neoplasm or metastatic disease in the chest, abdomen or pelvis. 2. Status post bilateral modified radical mastectomy and bilateral axillary lymph node dissection. 3. Three small supraumbilical ventral hernias containing only omental fat. No associated bowel incarceration or obstruction at this time. 4. Aortic atherosclerosis. 5. Additional incidental findings, as above.   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 for evaluation and management of newly diagnosed T-cell non-Hodgkin's lymphoma.  Patient has a history of bilateral breast cancer status post mastectomies.  -2007: s/p right lumpectomy for ADH in 08/2006 showed DCIS ER/PR+, grade 1. 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 She was adopted, no known family history    She subsequently was diagnosed with anal squamous cell carcinoma cT2 N0 M0 diagnosed in September 2020.Workup showed a 3 cm mass at the anorectal junction, biopsy confirmed well to moderately differentiated squamous cell carcinoma. 06/21/19 PET scan showed no metastasis.  -S/p concurrent chemoRT with Mitomycin and 5FU on 08/09/19. Now on surveillance. -surveillance CT CAP  07/09/21 was NED. -surveillance colonoscopy done for rectal bleeding on 12/07/21 showed only radiation changes. Her rectal bleeding has resolved.  Recently patient presented to her primary care physician  on 05/23/2022 with her new right neck lump which has been progressively enlarging.  She also subsequently developed a left neck swelling.  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  She has had a PET CT scan done on 06/19/2022 which showed hypermetabolic bilateral neck lymphadenopathy.  Also noted to have a new 1 cm focus of metabolic activity in segment 4 of the liver.  This is etiology indeterminate. Lumbar scoliosis and small supraumbilical hernia.  Patient has had Port-A-Cath placement done on 06/25/2022. Echocardiogram was done on 06/17/2022 and shows normal ejection fraction of 70 to 75% with grade 1 diastolic dysfunction.  Right ventricular systolic function within normal limits.  INTERVAL HISTORY  Cynthia Velasquez is a 72 y/o female here for evaluation and management of T-cell non-Hodgkin's lymphoma. Patient was last seen by me on 10/22/2022 and complained of headache, nausea, vomiting, diarrhea, fatigue and abdominal pain. She noted that she believed that she had food poisoning.  Today, she reports that her diarrhea has started again today. Her stools are completely watery and she has had 3 bowel movements since last night. She does complain of abdominal cramping. She consumes a soft diet to help with diarrhea.   She does complain of recent rectal bleeding and a burning/itching sensation.[name] Kitchen She was prescribed Nystatin cream and other ointments which she uses regularly.  She denies any new lumps bumps, fever, issues passing urine, or mouth sores. She has no issues with the port site or back pain. She does endorse ankle swelling, which she manages by soaking in epsom salt. She also complains of chills. She does endorse constipation and describes stool pellets until last week.  She does follows up with a doctor for her neuropathy in her feet and fingers, which have slightly worsened.  She was recently started on Cymbalta. She does use a cane regularly and is able to move around her home with no issues. She plans on engages in physical therapy soon.  ROS  10 Point review of Systems was done is negative except as noted above.   MEDICAL HISTORY:  Past Medical History:  Diagnosis Date   Anal cancer ([place]) dx'd 05/2019   Anxiety    Arthritis    Bilateral breast cancer ([place]) 10/21/2013   Breast cancer ([place])    Cancer ([place])    Depression    Gallstones    GERD (gastroesophageal reflux disease)    doesn't take any meds for this   Headache    h/o migraines       History of bladder infections    History of hiatal hernia    History of migraine    Hyperlipidemia    takes Simvastatin daily   Hypertension    takes Hyzaar   Joint pain    Joint swelling    Lumbar stenosis    Neuropathy 09/07/2013   Pneumonia    Pre-diabetes     SURGICAL HISTORY: Past Surgical History:  Procedure Laterality Date   ABDOMINAL HYSTERECTOMY     bone spur removed from left foot     CHOLECYSTECTOMY     COLONOSCOPY     COLONOSCOPY WITH PROPOFOL Bilateral 12/07/2021   Procedure: COLONOSCOPY WITH PROPOFOL;  Surgeon: Clarene Essex, MD;  Location: [facility];  Service: Endoscopy;  Laterality: Bilateral;   double mastectomy      ESOPHAGOGASTRODUODENOSCOPY N/A 12/07/2021   Procedure: ESOPHAGOGASTRODUODENOSCOPY (EGD);  Surgeon: Clarene Essex, MD;  Location: [facility];  Service: Endoscopy;  Laterality: N/A;   HERNIA REPAIR     umbilical   IR IMAGING GUIDED PORT INSERTION  06/25/2022   right knee arthroscopy     right shoulder arthroscopy     surgery for hiatal hernia     TONSILLECTOMY     TOTAL HIP ARTHROPLASTY Left 02/12/2013   TOTAL HIP ARTHROPLASTY Left 02/12/2013   Procedure: LEFT TOTAL HIP ARTHROPLASTY;  Surgeon: Kerin Salen, MD;  Location: [place];  Service: Orthopedics;  Laterality: Left;   TOTAL HIP ARTHROPLASTY Right 05/03/2013   Procedure: TOTAL HIP ARTHROPLASTY;  Surgeon: Kerin Salen, MD;  Location: [place];  Service: Orthopedics;  Laterality: Right;   TOTAL KNEE ARTHROPLASTY Right 02/27/2015   Procedure: TOTAL KNEE ARTHROPLASTY;  Surgeon: Frederik Pear, MD;  Location: [place];  Service: Orthopedics;  Laterality: Right;   TOTAL SHOULDER ARTHROPLASTY Right 09/12/2016   Procedure: RIGHT TOTAL SHOULDER ARTHROPLASTY;  Surgeon: Tania Ade, MD;  Location: [place];  Service: Orthopedics;  Laterality: Right;  RIGHT TOTAL SHOULDER ARTHROPLASTY    I have reviewed the social history and family history with the patient and they are unchanged from previous note.  ALLERGIES:  is allergic to tramadol, codeine, and vicodin [hydrocodone-acetaminophen].  MEDICATIONS:  Current Outpatient Medications  Medication Sig Dispense Refill   acyclovir (ZOVIRAX) 400 MG tablet Take 1 tablet (400 mg total) by mouth daily. 30 tablet 3   aspirin-acetaminophen-caffeine (EXCEDRIN MIGRAINE) 250-250-65 MG tablet Take 2 tablets by mouth every 6 (six) hours as needed for headache.     baclofen (LIORESAL) 10 MG tablet Take 1 tablet (10 mg total) by mouth 3 (three) times daily as needed for muscle spasms. 30 each 0   DULoxetine (CYMBALTA) 30 MG capsule Take 2 capsules (60 mg total) by mouth at bedtime. Take 1 capsule (30 mg) at bedtime for 1 week, if no side effects, then increase to 2 capsules (60 mg) thereafter. 60 capsule 5   famotidine (PEPCID) 20 MG tablet Take 1 tablet (20 mg total) by mouth 2 (two) times daily. 30 tablet 0   HYDROcodone-acetaminophen (NORCO) 5-325 MG tablet Take 1 tablet by mouth every 6 (six) hours as needed for moderate pain. (Patient not taking: Reported on 10/11/2022) 30 tablet 0   lidocaine-prilocaine (EMLA) cream Apply to affected area once (Patient not taking: Reported on 08/20/2022) 30 g 3   loratadine (CLARITIN) 10 MG tablet TAKE 1 TABLET(10 MG) BY MOUTH DAILY 30 tablet 0   losartan (COZAAR) 100 MG tablet Take 100 mg by mouth daily.  6   Menthol-Methyl Salicylate (SALONPAS PAIN  RELIEF PATCH EX) Apply 1 patch topically daily as needed (pain).     nystatin (MYCOSTATIN/NYSTOP) powder Apply 1 Application topically 3 (three) times daily. 15 g 0   ondansetron (ZOFRAN) 8 MG tablet Take 1 tablet (8 mg total) by mouth every 8 (eight) hours as needed for nausea or vomiting. Start on the third day after cyclophosphamide chemotherapy. 30 tablet 1   predniSONE (DELTASONE) 20 MG tablet Take 3 tablets (60 mg total) by mouth daily. Take for 2 days starting the day after chemotherapy on day 4 30 tablet 0   prochlorperazine (COMPAZINE) 10 MG tablet Take 1 tablet (10 mg total) by mouth every 6 (six) hours as needed for nausea or vomiting. 30 tablet 6  Saccharomyces boulardii 500 MG PACK Take 500 mg by mouth 2 (two) times daily with a meal. 60 each 0   senna-docusate (SENNA S) 8.6-50 MG tablet Take 2 tablets by mouth at bedtime. 60 tablet 1   No current facility-administered medications for this visit.    PHYSICAL EXAMINATION: .BP 114/89 (BP Location: Left Arm, Patient Position: Sitting)   Pulse 91   Temp 98.2 F (36.8 C) (Temporal)   Resp 19   Ht '5\' 5"'$  (1.651 m)   Wt 208 lb 6.4 oz (94.5 kg)   SpO2 99%   BMI 34.68 kg/m    GENERAL:alert, in no acute distress and comfortable SKIN: no acute rashes, no significant lesions EYES: conjunctiva are pink and non-injected, sclera anicteric OROPHARYNX: MMM, no exudates, no oropharyngeal erythema or ulceration NECK: supple, no JVD LYMPH:  no palpable lymphadenopathy in the cervical, axillary or inguinal regions LUNGS: clear to auscultation b/l with normal respiratory effort HEART: regular rate & rhythm ABDOMEN:  normoactive bowel sounds , non tender, not distended. Extremity: no pedal edema PSYCH: alert & oriented x 3 with fluent speech NEURO: no focal motor/sensory deficits   LABORATORY DATA:  I have reviewed the data as listed    Latest Ref Rng & Units 11/20/2022   10:03 AM 11/05/2022    3:41 AM 11/04/2022    3:24 AM  CBC  WBC  4.0 - 10.5 K/uL 4.6  6.3  7.6   Hemoglobin 12.0 - 15.0 g/dL 10.4  8.8  8.8   Hematocrit 36.0 - 46.0 % 32.6  28.8  28.4   Platelets 150 - 400 K/uL 248  211  205   .    Latest Ref Rng & Units 11/20/2022   10:03 AM 11/05/2022    3:41 AM 11/03/2022    8:00 AM  CMP  Glucose 70 - 99 mg/dL 110  120  128   BUN 8 - 23 mg/dL '11  13  6   '$ Creatinine 0.44 - 1.00 mg/dL 0.97  0.91  1.03   Sodium 135 - 145 mmol/L 140  137  137   Potassium 3.5 - 5.1 mmol/L 3.8  3.8  3.4   Chloride 98 - 111 mmol/L 108  103  107   CO2 22 - 32 mmol/L '26  26  25   '$ Calcium 8.9 - 10.3 mg/dL 9.5  8.1  8.1   Total Protein 6.5 - 8.1 g/dL 7.2  5.4  6.5   Total Bilirubin 0.3 - 1.2 mg/dL 0.4  0.3  0.7   Alkaline Phos 38 - 126 U/L 79  80  105   AST 15 - 41 U/L '18  20  18   '$ ALT 0 - 44 U/L '10  20  27       '$ Legend: (L) Low (H) High  . Lab Results  Component Value Date   LDH 185 11/20/2022           RADIOGRAPHIC STUDIES: .No results found.   ASSESSMENT & PLAN:  Cynthia Velasquez is a 72 y.o. female with   1.  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 Plan -Results of excisional lymph node biopsy showing peripheral T-cell lymphoma not otherwise specified which is CD30 negative were discussed in detail with the patient. PET CT scan was previously reviewed and she appears to have at least stage II disease but depending on the liver lesion etiology this could represent stage IV disease. She has had anthracycline exposure as a part of her AC regimen  for previous treatment of breast cancer and is therefore limited in her total lifetime anthracycline use. CD 30 negative status precludes use of Adcentris.  2. Anal Squamous Cell Carcinoma, cT2N0M0-currently in remission.  Details as noted above   3. 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 Completed tamoxifen 09/2007 - 09/2012 -2008: stage pT2N0 left breast invasive ductal  carcinoma, triple negative, grade 3; s/p lumpectomy in 07/2007 and bilateral mastectomy 11/2007 (path negative for residual carcinoma in both breasts); s/p adjuvant chemo AC-T and radiation  PLAN: -patient is s/p 6 cycles of R-CEOP -Discussed lab results on 11/20/2022 in detail with patient. CBC showed WBC of 4.6K, hemoglobin improved to 10.4, and platelets of 248K. -CMP improved, potassium normal, no dehydration, protein levels in blood improved -LDH . Lab Results  Component Value Date   LDH 185 11/20/2022  -repeat PET scan to evaluate post treatment response -recommend cultured yogurt or OTC probiotic tablets. Recommend to mix powdered form of proiotics with yogurt -If diarrhea remains bothersome, discussed option of long acting Octreotide injections to improve diarrhea symptoms. Advised patient to generally avoid laxatives. -recent hospitalization details with UTI and sapovirus infection reviewed. -she is also following with GI to address her chronic diarrhea.  FOLLOW UP: PET/CT in 3 weeks RTC with Dr Irene Limbo with portflush and labs in 4 weeks  The total time spent in the appointment was 31 minutes* .  All of the patient's questions were answered with apparent satisfaction. The patient knows to call the clinic with any problems, questions or concerns.   Sullivan Lone MD MS AAHIVMS [facility] Hematology/Oncology Physician [facility]  .*Total Encounter Time as defined by the Centers for Medicare and Medicaid Services includes, in addition to the face-to-face time of a patient visit (documented in the note above) non-face-to-face time: obtaining and reviewing outside history, ordering and reviewing medications, tests or procedures, care coordination (communications with other health care professionals or caregivers) and documentation in the medical record.   I,Mitra Faeizi,acting as a Education administrator for Sullivan Lone, MD.,have documented all relevant documentation on the behalf of Sullivan Lone, MD,as directed by  Sullivan Lone, MD while in the presence of Sullivan Lone, MD.  .I have reviewed the above documentation for accuracy and completeness, and I agree with the above. Brunetta Genera MD

## 2022-11-21 ENCOUNTER — Telehealth: Payer: Self-pay | Admitting: Hematology

## 2022-11-21 DIAGNOSIS — R2689 Other abnormalities of gait and mobility: Secondary | ICD-10-CM | POA: Diagnosis not present

## 2022-11-21 DIAGNOSIS — M6281 Muscle weakness (generalized): Secondary | ICD-10-CM | POA: Diagnosis not present

## 2022-11-21 NOTE — Telephone Encounter (Signed)
Called patient per 3/6 los notes to schedule f/u. Patient scheduled and notified.

## 2022-11-23 ENCOUNTER — Encounter: Payer: Self-pay | Admitting: Hematology

## 2022-11-26 ENCOUNTER — Encounter: Payer: Self-pay | Admitting: Hematology

## 2022-11-27 ENCOUNTER — Encounter: Payer: Self-pay | Admitting: Hematology

## 2022-11-28 DIAGNOSIS — R2689 Other abnormalities of gait and mobility: Secondary | ICD-10-CM | POA: Diagnosis not present

## 2022-11-28 DIAGNOSIS — M6281 Muscle weakness (generalized): Secondary | ICD-10-CM | POA: Diagnosis not present

## 2022-12-06 DIAGNOSIS — M19012 Primary osteoarthritis, left shoulder: Secondary | ICD-10-CM | POA: Diagnosis not present

## 2022-12-09 ENCOUNTER — Encounter: Payer: Self-pay | Admitting: Hematology

## 2022-12-10 ENCOUNTER — Encounter: Payer: Self-pay | Admitting: Hematology

## 2022-12-12 ENCOUNTER — Encounter: Payer: Self-pay | Admitting: Hematology

## 2022-12-13 ENCOUNTER — Encounter (HOSPITAL_COMMUNITY)
Admission: RE | Admit: 2022-12-13 | Discharge: 2022-12-13 | Disposition: A | Discharge status: Home / Self Care | Payer: PPO | Source: Ambulatory Visit | Attending: Hematology | Admitting: Hematology

## 2022-12-13 DIAGNOSIS — C8448 Peripheral T-cell lymphoma, not classified, lymph nodes of multiple sites: Secondary | ICD-10-CM | POA: Insufficient documentation

## 2022-12-13 DIAGNOSIS — C8590 Non-Hodgkin lymphoma, unspecified, unspecified site: Secondary | ICD-10-CM | POA: Diagnosis not present

## 2022-12-13 LAB — GLUCOSE, CAPILLARY: Glucose-Capillary: 111 mg/dL — ABNORMAL HIGH (ref 70–99)

## 2022-12-13 MED ORDER — FLUDEOXYGLUCOSE F - 18 (FDG) INJECTION
10.0000 | Freq: Once | INTRAVENOUS | Status: AC
  Administered 2022-12-13: 9.96 via INTRAVENOUS

## 2022-12-16 NOTE — Progress Notes (Signed)
I saw Cynthia Velasquez in neurology clinic on 12/20/22 in follow up for bilateral carpal tunnel and chemotherapy induced neuropathy.  HPI: Cynthia Velasquez is a 72 y.o. year old female with a history of anal cancer, bilateral breast cancer, lymphoma, HTN, HLD, depression, anxiety, pre-diabetes, lumbar stenosis, migraines who we last saw on 10/11/22.  To briefly review: Patient first noticed numbness and tingling in the past 2 months (~07/2022) in her feet and thumb and index fingers bilaterally. It now includes her middle fingers. She had difficulty using these fingers. She feels like there is something on her feet when there isn't. She feels some imbalance when walking. She has "gracefully went to the floor" (fell) due to feeling lightheaded and in a cold sweat (07/2022). She also feels off balance if she gets up too fast.   She was told she might have carpal tunnel syndrome and recommended to wear braces by an oncologist. She does wear them on a regular basis due to comfort. Oncology suggested the onset of neuropathy was following the first cycle of chemotherapy for lymphoma and worse with each infusion (likely 2/2 vincristine). Patient has continued with vincristine despite symptoms, but it was reduced. Patient did not want it stopped as she only has a month to go.   Patient mentions she has difficulty getting up from toilet and needs assistance with that.   Patient has not taking any or tried any neuropathic medications.   Patient is on a vitamin B complex, but only for a couple of months.   Oncology history per Dr. Grier Mitts clinic note from 09/10/22: Cynthia Velasquez has been referred to [name] by [name] for evaluation and management of newly diagnosed T-cell non-Hodgkin's lymphoma.   Patient has a history of bilateral breast cancer status post mastectomies.  -2007: s/p right lumpectomy for ADH in 08/2006 showed DCIS ER/PR+, grade 1. Completed tamoxifen 09/2007 - 09/2012 -2008: stage pT2N0 left breast  invasive ductal carcinoma, triple negative, grade 3; s/p lumpectomy in 07/2007 and bilateral mastectomy 11/2007 (path negative for residual carcinoma in both breasts); s/p adjuvant chemo AC-T and radiation  -Per pt her Genetics Testing was negative. She was adopted, no known family history     She subsequently was diagnosed with anal squamous cell carcinoma cT2 N0 M0 diagnosed in September 2020.Workup showed a 3 cm mass at the anorectal junction, biopsy confirmed well to moderately differentiated squamous cell carcinoma. 06/21/19 PET scan showed no metastasis.  -S/p concurrent chemoRT with Mitomycin and 5FU on 08/09/19. Now on surveillance. -surveillance CT CAP 07/09/21 was NED. -surveillance colonoscopy done for rectal bleeding on 12/07/21 showed only radiation changes. Her rectal bleeding has resolved.   Recently patient presented to her primary care physician on 05/23/2022 with her new right neck lump which has been progressively enlarging.  She also subsequently developed a left neck swelling.  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   She has had a PET CT scan done on 06/19/2022 which showed hypermetabolic bilateral neck lymphadenopathy.  Also noted to have a new 1 cm focus of metabolic activity in segment 4 of the liver.  This is etiology indeterminate. Lumbar scoliosis and small supraumbilical hernia.   Patient has had Port-A-Cath placement done on 06/25/2022. Echocardiogram was done on 06/17/2022 and shows normal ejection fraction of 70 to 75% with grade 1 diastolic dysfunction.  Right ventricular systolic function within normal limits.   INTERVAL HISTORY  Cynthia Velasquez is here today for evaluation and management of T-cell non-Hodgkin's lymphoma. She is here for follow-up prior to cycle 4 of CEOP. She has not had a PET CT scan yet but this is scheduled for 09/19/2021. She has  seen Dr. Barbaraann Cao who feels that tingling and numbness in her fingers could be from chemotherapy or from carpal tunnel syndrome but likely from chemotherapy. Her vincristine was dose reduced for her last chemotherapy cycle and her neuropathy is stable.  She prefers to continue with the same dose of vincristine as opposed to holding this chemotherapy completely. No new infections or other new toxicities at this time No new lumps or bumps noted. No new abdominal pain or distention. Patient has received the influenza vaccine, COVID-19 Booster, and RSV vaccine.    Current chemotherapy: Cytoxan, Etoposide, vincristine, decadron   EtOH use: Rare  Restrictive diet? No Family history of neuropathy/myopathy/neurologic disease? Not aware, patient is adopted   Patient had an EMG in her 55s and told she had carpal tunnel back then (50 years ago).  Most recent Assessment and Plan (10/11/22): Her neurological examination is pertinent for length dependent sensory loss, weakness of thumb abduction, and weakness of hip flexors. Available diagnostic data is significant for MRI lumbar spine from 04/2022 with potential left L3 impingement. Patient's symptoms are likely multifactorial. She has a known history of bilateral carpal tunnel syndrome for about 50 years. This is likely contributing to her numbness/tingling in hands and weakness of hands. She also likely has a peripheral neuropathy, likely secondary to chemotherapy (vincristine) as her symptoms started soon after this. In terms of her hip flexor weakness, this could be due to deconditioning and/or lumbar stenosis. I will get lab work to look for other treatable causes of neuropathy and an EMG to further characterize symptoms and their severity.   PLAN: -Blood work: B1, B12, HbA1c, IFE, TSH -EMG: PN protcol (R > L)  -Physical therapy for proximal leg weakness (likely deconditioning) and imbalance -For neuropathic pain, start Cymbalta 30 mg for 1 week, then  increase to 60 mg thereafter if no side effects  Since their last visit: Labs were significant for A1c of 6.3. Patient has not gotten her EMG yet due to being hospitalized. She was admitted from 10/28/22 to 11/05/22 for chronic infectious diarrhea, metabolic acidosis, UTI, and pancytopenia.  Patient continues to have a lot of symptoms in her feet and first two fingers of her hands. She has leg cramping as well. She has some leg weakness, particularly when trying to get up out of a chair. Patient is going to PT, which she enjoys. She thinks her strength is a work in process. Patient is taking Cymbalta 60 mg daily. She thinks it may be helping some. She does not have significant side effects.  Patient has the wrist splints that were recommended. She wears them at night. She has noticed some improvement in her hand symptoms.  Regarding her cancer, she has finished chemo for now. PET on 12/13/22 mentions recurrent hypermetabolic cervical lymphadenopathy concerning for recurrent disease. She is following up with oncology for this.  She has no new complaints.   MEDICATIONS:  Outpatient Encounter Medications as of 12/20/2022  Medication Sig   acyclovir (ZOVIRAX) 400 MG tablet Take 1 tablet (400 mg total) by mouth daily.   aspirin-acetaminophen-caffeine (EXCEDRIN MIGRAINE) 250-250-65 MG tablet Take 2 tablets by mouth every 6 (six) hours as needed for headache.   baclofen (LIORESAL) 10 MG tablet Take 1 tablet (10 mg total)  by mouth 3 (three) times daily as needed for muscle spasms.   diphenoxylate-atropine (LOMOTIL) 2.5-0.025 MG tablet Take 2 tablets by mouth 4 (four) times daily as needed for diarrhea or loose stools.   lidocaine (LMX) 4 % cream Apply topically 2 (two) times daily as needed (pain).   losartan (COZAAR) 100 MG tablet Take 100 mg by mouth daily.   Menthol-Methyl Salicylate (SALONPAS PAIN RELIEF PATCH EX) Apply 1 patch topically daily as needed (pain).   Nystatin (GERHARDT'S BUTT CREAM) CREA  Apply 1 Application topically 2 (two) times daily.   nystatin (MYCOSTATIN/NYSTOP) powder Apply 1 Application topically 3 (three) times daily. (Patient taking differently: Apply 1 Application topically 3 (three) times daily as needed (redness, rash).)   pantoprazole (PROTONIX) 40 MG tablet Take 1 tablet (40 mg total) by mouth daily.   prochlorperazine (COMPAZINE) 10 MG tablet Take 1 tablet (10 mg total) by mouth every 6 (six) hours as needed for nausea or vomiting.   saccharomyces boulardii (FLORASTOR) 250 MG capsule Take 1 capsule (250 mg total) by mouth 3 (three) times daily.   [DISCONTINUED] DULoxetine (CYMBALTA) 30 MG capsule Take 2 capsules (60 mg total) by mouth at bedtime. Take 1 capsule (30 mg) at bedtime for 1 week, if no side effects, then increase to 2 capsules (60 mg) thereafter.   DULoxetine (CYMBALTA) 60 MG capsule Take 1 capsule (60 mg total) by mouth 2 (two) times daily. Take 1 capsule (30 mg) at bedtime for 1 week, if no side effects, then increase to 2 capsules (60 mg) thereafter.   [DISCONTINUED] HYDROcodone-acetaminophen (NORCO) 5-325 MG tablet Take 1 tablet by mouth every 6 (six) hours as needed for moderate pain. (Patient not taking: Reported on 10/11/2022)   [DISCONTINUED] lidocaine-prilocaine (EMLA) cream Apply to affected area once (Patient not taking: Reported on 08/20/2022)   [DISCONTINUED] loratadine (CLARITIN) 10 MG tablet TAKE 1 TABLET(10 MG) BY MOUTH DAILY (Patient taking differently: Take 10 mg by mouth daily as needed for allergies.)   [DISCONTINUED] octreotide (SANDOSTATIN) 100 MCG/ML SOLN injection Inject 1 mL (100 mcg total) into the skin 3 (three) times daily as needed (Diarrhea).   [DISCONTINUED] ondansetron (ZOFRAN) 8 MG tablet Take 1 tablet (8 mg total) by mouth every 8 (eight) hours as needed for nausea or vomiting. Start on the third day after cyclophosphamide chemotherapy.   No facility-administered encounter medications on file as of 12/20/2022.    PAST MEDICAL  HISTORY: Past Medical History:  Diagnosis Date   Anal cancer dx'd 05/2019   Anxiety    Arthritis    Bilateral breast cancer 10/21/2013   Breast cancer    Cancer    Depression    Gallstones    GERD (gastroesophageal reflux disease)    doesn't take any meds for this   Headache    h/o migraines       History of bladder infections    History of hiatal hernia    History of migraine    Hyperlipidemia    takes Simvastatin daily   Hypertension    takes Hyzaar   Joint pain    Joint swelling    Lumbar stenosis    Neuropathy 09/07/2013   Pneumonia    Pre-diabetes     PAST SURGICAL HISTORY: Past Surgical History:  Procedure Laterality Date   ABDOMINAL HYSTERECTOMY     bone spur removed from left foot     CHOLECYSTECTOMY     COLONOSCOPY     COLONOSCOPY WITH PROPOFOL Bilateral 12/07/2021   Procedure: COLONOSCOPY  WITH PROPOFOL;  Surgeon: Vida Rigger, MD;  Location: [facility];  Service: Endoscopy;  Laterality: Bilateral;   double mastectomy      ESOPHAGOGASTRODUODENOSCOPY N/A 12/07/2021   Procedure: ESOPHAGOGASTRODUODENOSCOPY (EGD);  Surgeon: Vida Rigger, MD;  Location: [facility];  Service: Endoscopy;  Laterality: N/A;   HERNIA REPAIR     umbilical   IR IMAGING GUIDED PORT INSERTION  06/25/2022   right knee arthroscopy     right shoulder arthroscopy     surgery for hiatal hernia     TONSILLECTOMY     TOTAL HIP ARTHROPLASTY Left 02/12/2013   TOTAL HIP ARTHROPLASTY Left 02/12/2013   Procedure: LEFT TOTAL HIP ARTHROPLASTY;  Surgeon: Nestor Lewandowsky, MD;  Location: [facility] OR;  Service: Orthopedics;  Laterality: Left;   TOTAL HIP ARTHROPLASTY Right 05/03/2013   Procedure: TOTAL HIP ARTHROPLASTY;  Surgeon: Nestor Lewandowsky, MD;  Location: [facility] OR;  Service: Orthopedics;  Laterality: Right;   TOTAL KNEE ARTHROPLASTY Right 02/27/2015   Procedure: TOTAL KNEE ARTHROPLASTY;  Surgeon: Gean Birchwood, MD;  Location: [facility] OR;  Service: Orthopedics;  Laterality: Right;   TOTAL SHOULDER ARTHROPLASTY Right  09/12/2016   Procedure: RIGHT TOTAL SHOULDER ARTHROPLASTY;  Surgeon: Jones Broom, MD;  Location: [facility] OR;  Service: Orthopedics;  Laterality: Right;  RIGHT TOTAL SHOULDER ARTHROPLASTY    ALLERGIES: Allergies  Allergen Reactions   Tramadol Itching and Rash   Codeine Itching and Rash   Vicodin [Hydrocodone-Acetaminophen] Itching and Rash    FAMILY HISTORY: Family History  Adopted: Yes  Problem Relation Age of Onset   Allergic rhinitis Neg Hx    Angioedema Neg Hx    Atopy Neg Hx    Eczema Neg Hx    Immunodeficiency Neg Hx    Urticaria Neg Hx    Asthma Neg Hx     SOCIAL HISTORY: Social History   Tobacco Use   Smoking status: Never   Smokeless tobacco: Never  Vaping Use   Vaping Use: Never used  Substance Use Topics   Alcohol use: No    Comment: occ   Drug use: No   Social History   Social History Narrative   Are you right handed or left handed? Right   Are you currently employed ? no   What is your current occupation?retired   Do you live at home alone? no   Who lives with you? Daughter and granddaughter   What type of home do you live in: 1 story or 2 story? one   Caffeine 1 cup a day    Objective:  Vital Signs:  BP (!) 151/90   Pulse 94   Resp 18   Ht 5\' 5"  (1.651 m)   Wt 202 lb (91.6 kg)   SpO2 97%   BMI 33.61 kg/m   General: General appearance: Awake and alert. No distress. Cooperative with exam.  Skin: No obvious rash or jaundice. HEENT: Atraumatic. Anicteric. Lungs: Non-labored breathing on room air   Neurological: Mental Status: Alert. Speech fluent. No pseudobulbar affect Cranial Nerves: CNII: No RAPD. Visual fields intact. CNIII, IV, VI: PERRL. No nystagmus. EOMI. CN V: Facial sensation intact bilaterally to fine touch. CN VII: Facial muscles symmetric and strong. No ptosis at rest CN VIII: Hears finger rub well bilaterally. CN IX: No hypophonia. CN X: Palate elevates symmetrically. CN XI: Full strength shoulder shrug  bilaterally. CN XII: Tongue protrusion full and midline. No atrophy or fasciculations. No significant dysarthria Motor: Tone is normal  Individual muscle group testing (MRC grade out  of 5):  Movement     Neck flexion 5    Neck extension 5     Right Left   Shoulder abduction 5- 5- Shoulder pain  Elbow flexion 5 5   Elbow extension 5 5   Finger abduction - FDI 5 5   Finger abduction - ADM 5 5   Finger extension 5 5   Finger distal flexion - 2/3 5 5    Finger distal flexion - 4/5 5 5    Thumb flexion - FPL 5 5   Thumb abduction - APB 4 4    Hip flexion 5- 5-   Hip extension 5 5   Hip adduction 5 5   Hip abduction 5 5   Knee extension 5 5   Knee flexion 5 5   Dorsiflexion 5 5   Plantarflexion 5 5     Reflexes:  Right Left  Bicep 2+ 2+  Tricep 2+ 2+  BrRad 2+ 2+  Knee 2+ 2+  Ankle 0 0   Sensation: Pinprick: 50% over thenar eminence compared to medial aspect of hand; diminished in bilateral lower extremities into thighs Vibration: Absent in bilateral great toes, diminished in bilateral ankles, and fully intact at bilateral patella Proprioception: Intact in bilateral great toes Coordination: Intact finger-to- nose-finger bilaterally. Romberg with moderate sway. Gait: Narrow-based gait  Lab and Test Review: New results: 10/11/22: TSH wnl IFE no M protein A1c: 6.3 B12: 963 B1: 9  Previously reviewed results: 10/01/22: Normal or unremarkable: LDH, CMP CBC significant for anemia (Hb 10.6)   TSH (12/23/19): 1.578   Imaging: MRI lumbar spine (04/18/22): FINDINGS: Segmentation:  5 lumbar vertebrae.  Rudimentary S1-2 disc.   Alignment: Moderate upper lumbar dextroscoliosis. Unchanged grade 1 anterolisthesis of L3 on L4 and L4 on L5.   Vertebrae: No fracture or suspicious marrow lesion. Persistent mild right facet edema at L4-5.   Conus medullaris and cauda equina: Conus extends to the L1 level. Conus and cauda equina appear normal.   Paraspinal and other soft  tissues: Small bilateral renal cysts.   Disc levels:   Disc desiccation and mild-to-moderate disc space narrowing throughout the lumbar and included lower thoracic spine.   T10-11: Only imaged sagittally. Mild disc bulging and moderate to severe right facet arthrosis result in mild-to-moderate right neural foraminal stenosis without spinal stenosis, unchanged.   T11-12: Only imaged sagittally. Disc bulging and moderate to severe right and moderate left facet arthrosis result in moderate to severe right neural foraminal stenosis without spinal stenosis, unchanged.   T12-L1: Disc bulging, a left paracentral to left subarticular disc protrusion, and moderate left greater than right facet hypertrophy result in mild left lateral recess stenosis and mild left neural foraminal stenosis without spinal stenosis, unchanged.   L1-2: Left eccentric disc bulging and moderate left greater than right facet and ligamentum flavum hypertrophy result in mild left lateral recess stenosis without spinal or neural foraminal stenosis, unchanged.   L2-3: Disc bulging, a left foraminal disc protrusion, and moderate right and severe left facet and ligamentum flavum hypertrophy result in borderline spinal stenosis, mild left lateral recess stenosis, and moderate left neural foraminal stenosis, unchanged.   L3-4: Anterolisthesis with left eccentric bulging of uncovered disc, a left foraminal disc protrusion, and severe facet and ligamentum flavum hypertrophy result in mild-to-moderate spinal stenosis, mild-to-moderate left greater than right lateral recess stenosis, and mild right and severe left neural foraminal stenosis, stable to minimally progressed. Potential left L3 nerve root impingement.   L4-5: Anterolisthesis with bulging  uncovered disc and severe right greater than left facet and ligamentum flavum hypertrophy result in mild-to-moderate spinal stenosis, mild-to-moderate bilateral lateral recess  stenosis, and the moderate bilateral neural foraminal stenosis, unchanged.   L5-S1: Bilateral facet ankylosis.  Mild disc bulging.  No stenosis.   IMPRESSION: Diffuse lumbar disc and facet degeneration without significant interval change resulting in mild-to-moderate spinal stenosis and up to severe neural foraminal stenosis as above.  ASSESSMENT: This is Cynthia Velasquez, a 72 y.o. 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 Possible contribution from pre-diabetes as well Bilateral carpal tunnel syndrome Physical deconditioning  Plan: -Patient previously missed EMG appointment as she was hospitalized. We discussed today and patient would prefer to hold off rescheduling for now given that she has a lot going on and would like to focus on oncologic problems -Increase Cymbalta to 60 mg BID -Lidocaine cream PRN -Continue wrist splints at night -Continue PT  Return to clinic in 6 months  Jacquelyne Balint, MD

## 2022-12-17 ENCOUNTER — Other Ambulatory Visit: Payer: Self-pay

## 2022-12-17 DIAGNOSIS — Z6833 Body mass index (BMI) 33.0-33.9, adult: Secondary | ICD-10-CM | POA: Diagnosis not present

## 2022-12-17 DIAGNOSIS — M62838 Other muscle spasm: Secondary | ICD-10-CM | POA: Diagnosis not present

## 2022-12-17 DIAGNOSIS — M5412 Radiculopathy, cervical region: Secondary | ICD-10-CM | POA: Diagnosis not present

## 2022-12-19 DIAGNOSIS — R590 Localized enlarged lymph nodes: Secondary | ICD-10-CM | POA: Diagnosis not present

## 2022-12-19 DIAGNOSIS — M6281 Muscle weakness (generalized): Secondary | ICD-10-CM | POA: Diagnosis not present

## 2022-12-19 DIAGNOSIS — R2689 Other abnormalities of gait and mobility: Secondary | ICD-10-CM | POA: Diagnosis not present

## 2022-12-20 ENCOUNTER — Ambulatory Visit: Payer: PPO | Admitting: Neurology

## 2022-12-20 ENCOUNTER — Encounter: Payer: Self-pay | Admitting: Neurology

## 2022-12-20 VITALS — BP 151/90 | HR 94 | Resp 18 | Ht 65.0 in | Wt 202.0 lb

## 2022-12-20 DIAGNOSIS — R2689 Other abnormalities of gait and mobility: Secondary | ICD-10-CM

## 2022-12-20 DIAGNOSIS — G620 Drug-induced polyneuropathy: Secondary | ICD-10-CM

## 2022-12-20 DIAGNOSIS — T451X5A Adverse effect of antineoplastic and immunosuppressive drugs, initial encounter: Secondary | ICD-10-CM

## 2022-12-20 DIAGNOSIS — G5603 Carpal tunnel syndrome, bilateral upper limbs: Secondary | ICD-10-CM

## 2022-12-20 DIAGNOSIS — R209 Unspecified disturbances of skin sensation: Secondary | ICD-10-CM

## 2022-12-20 DIAGNOSIS — R29898 Other symptoms and signs involving the musculoskeletal system: Secondary | ICD-10-CM

## 2022-12-20 MED ORDER — DULOXETINE HCL 60 MG PO CPEP: 60.0000 mg | ORAL_CAPSULE | Freq: Two times a day (BID) | ORAL | 11 refills | Status: DC

## 2022-12-20 NOTE — Patient Instructions (Addendum)
I will increase your Cymbalta today to 60 mg twice daily. I sent a new prescription to your pharmacy.  You can also try Lidocaine cream as needed. Apply wear you have pain, tingling, or burning. Wear gloves to prevent your hands being numb. This can be bought over the counter at any drug store or online.   Continue physical therapy. This will help with leg weakness and imbalance.  Continue the wrist splints at night for carpal tunnel syndrome.  Cock up wrist splint for carpal tunnel symptoms can be bought in local drug stores or online. This should especially be worn at night while sleeping.     I will see you back in clinic in 6 months. Please let me know if you have any questions or concerns in the meantime.  The physicians and staff at [facility] are committed to providing excellent care. You may receive a survey requesting feedback about your experience at our office. We strive to receive "very good" responses to the survey questions. If you feel that your experience would prevent you from giving the office a "very good " response, please contact our office to try to remedy the situation. We may be reached at [phone]. Thank you for taking the time out of your busy day to complete the survey.  Jacquelyne Balint, MD [facility]

## 2022-12-26 ENCOUNTER — Telehealth: Payer: Self-pay | Admitting: Hematology

## 2022-12-26 DIAGNOSIS — R590 Localized enlarged lymph nodes: Secondary | ICD-10-CM

## 2022-12-26 NOTE — Telephone Encounter (Signed)
Called patient with her PET CT scan results.  She does note that she has had allergies and postnasal drip and is somewhat sore throat which comes and goes but she is also noted an enlarging right upper neck lymph node.  We reviewed the findings of the PET CT scan which also shows hypermetabolic cervical lymph nodes.  We discussed and patient is agreeable with an ultrasound-guided core needle biopsy of her right upper neck lymph node for a tissue diagnosis to rule out lymphoma recurrence versus reactive lymph nodes from possible URI.  Order placed for ultrasound-guided core needle biopsy of her cervical lymph node.

## 2022-12-26 NOTE — Progress Notes (Unsigned)
Cynthia Dance, MD  Georgeann Oppenheim for [name] CORE BX RIGHT CERVICAL PET + NODES  SEE RECENT PET  TS

## 2022-12-27 DIAGNOSIS — R2689 Other abnormalities of gait and mobility: Secondary | ICD-10-CM | POA: Diagnosis not present

## 2022-12-27 DIAGNOSIS — M6281 Muscle weakness (generalized): Secondary | ICD-10-CM | POA: Diagnosis not present

## 2022-12-28 ENCOUNTER — Emergency Department (HOSPITAL_COMMUNITY): Payer: PPO

## 2022-12-28 ENCOUNTER — Observation Stay (HOSPITAL_COMMUNITY)
Admission: EM | Admit: 2022-12-28 | Discharge: 2022-12-30 | Disposition: A | Discharge status: Home / Self Care | Payer: PPO | Attending: Internal Medicine | Admitting: Internal Medicine

## 2022-12-28 DIAGNOSIS — Z96643 Presence of artificial hip joint, bilateral: Secondary | ICD-10-CM | POA: Insufficient documentation

## 2022-12-28 DIAGNOSIS — I6522 Occlusion and stenosis of left carotid artery: Secondary | ICD-10-CM | POA: Diagnosis not present

## 2022-12-28 DIAGNOSIS — Z96611 Presence of right artificial shoulder joint: Secondary | ICD-10-CM | POA: Insufficient documentation

## 2022-12-28 DIAGNOSIS — Z7982 Long term (current) use of aspirin: Secondary | ICD-10-CM | POA: Insufficient documentation

## 2022-12-28 DIAGNOSIS — Z79899 Other long term (current) drug therapy: Secondary | ICD-10-CM | POA: Insufficient documentation

## 2022-12-28 DIAGNOSIS — R55 Syncope and collapse: Principal | ICD-10-CM | POA: Insufficient documentation

## 2022-12-28 DIAGNOSIS — K219 Gastro-esophageal reflux disease without esophagitis: Secondary | ICD-10-CM | POA: Diagnosis present

## 2022-12-28 DIAGNOSIS — Z853 Personal history of malignant neoplasm of breast: Secondary | ICD-10-CM | POA: Diagnosis not present

## 2022-12-28 DIAGNOSIS — E876 Hypokalemia: Secondary | ICD-10-CM | POA: Insufficient documentation

## 2022-12-28 DIAGNOSIS — I10 Essential (primary) hypertension: Secondary | ICD-10-CM | POA: Diagnosis present

## 2022-12-28 DIAGNOSIS — J9811 Atelectasis: Secondary | ICD-10-CM | POA: Diagnosis not present

## 2022-12-28 DIAGNOSIS — C8448 Peripheral T-cell lymphoma, not classified, lymph nodes of multiple sites: Secondary | ICD-10-CM | POA: Diagnosis not present

## 2022-12-28 DIAGNOSIS — R29818 Other symptoms and signs involving the nervous system: Secondary | ICD-10-CM | POA: Diagnosis not present

## 2022-12-28 DIAGNOSIS — Z96651 Presence of right artificial knee joint: Secondary | ICD-10-CM | POA: Insufficient documentation

## 2022-12-28 DIAGNOSIS — Z85038 Personal history of other malignant neoplasm of large intestine: Secondary | ICD-10-CM | POA: Diagnosis not present

## 2022-12-28 DIAGNOSIS — R404 Transient alteration of awareness: Secondary | ICD-10-CM | POA: Diagnosis not present

## 2022-12-28 DIAGNOSIS — R61 Generalized hyperhidrosis: Secondary | ICD-10-CM | POA: Diagnosis not present

## 2022-12-28 LAB — CBC WITH DIFFERENTIAL/PLATELET
Abs Immature Granulocytes: 0.01 10*3/uL (ref 0.00–0.07)
Basophils Absolute: 0 10*3/uL (ref 0.0–0.1)
Basophils Relative: 0 %
Eosinophils Absolute: 0 10*3/uL (ref 0.0–0.5)
Eosinophils Relative: 1 %
HCT: 34.4 % — ABNORMAL LOW (ref 36.0–46.0)
Hemoglobin: 10.4 g/dL — ABNORMAL LOW (ref 12.0–15.0)
Immature Granulocytes: 0 %
Lymphocytes Relative: 21 %
Lymphs Abs: 0.8 10*3/uL (ref 0.7–4.0)
MCH: 26.6 pg (ref 26.0–34.0)
MCHC: 30.2 g/dL (ref 30.0–36.0)
MCV: 88 fL (ref 80.0–100.0)
Monocytes Absolute: 0.4 10*3/uL (ref 0.1–1.0)
Monocytes Relative: 10 %
Neutro Abs: 2.7 10*3/uL (ref 1.7–7.7)
Neutrophils Relative %: 68 %
Platelets: 232 10*3/uL (ref 150–400)
RBC: 3.91 MIL/uL (ref 3.87–5.11)
RDW: 15.8 % — ABNORMAL HIGH (ref 11.5–15.5)
WBC: 3.9 10*3/uL — ABNORMAL LOW (ref 4.0–10.5)
nRBC: 0 % (ref 0.0–0.2)

## 2022-12-28 LAB — COMPREHENSIVE METABOLIC PANEL
ALT: 10 U/L (ref 0–44)
AST: 21 U/L (ref 15–41)
Albumin: 3.1 g/dL — ABNORMAL LOW (ref 3.5–5.0)
Alkaline Phosphatase: 80 U/L (ref 38–126)
Anion gap: 11 (ref 5–15)
BUN: 10 mg/dL (ref 8–23)
CO2: 25 mmol/L (ref 22–32)
Calcium: 8.8 mg/dL — ABNORMAL LOW (ref 8.9–10.3)
Chloride: 105 mmol/L (ref 98–111)
Creatinine, Ser: 0.93 mg/dL (ref 0.44–1.00)
GFR, Estimated: >60 mL/min (ref >60)
Glucose, Bld: 102 mg/dL — ABNORMAL HIGH (ref 70–99)
Potassium: 2.9 mmol/L — ABNORMAL LOW (ref 3.5–5.1)
Sodium: 141 mmol/L (ref 135–145)
Total Bilirubin: 0.7 mg/dL (ref 0.3–1.2)
Total Protein: 6.4 g/dL — ABNORMAL LOW (ref 6.5–8.1)

## 2022-12-28 LAB — CBG MONITORING, ED: Glucose-Capillary: 94 mg/dL (ref 70–99)

## 2022-12-28 LAB — TROPONIN I (HIGH SENSITIVITY): Troponin I (High Sensitivity): 5 ng/L (ref <18)

## 2022-12-28 LAB — MAGNESIUM: Magnesium: 1.5 mg/dL — ABNORMAL LOW (ref 1.7–2.4)

## 2022-12-28 MED ORDER — SODIUM CHLORIDE 0.9 % IV BOLUS
1000.0000 mL | Freq: Once | INTRAVENOUS | Status: AC
  Administered 2022-12-28: 1000 mL via INTRAVENOUS

## 2022-12-28 MED ORDER — ACETAMINOPHEN 500 MG PO TABS
1000.0000 mg | ORAL_TABLET | Freq: Once | ORAL | Status: AC
  Administered 2022-12-28: 1000 mg via ORAL
  Filled 2022-12-28: qty 2

## 2022-12-28 MED ORDER — IOHEXOL 350 MG/ML SOLN
75.0000 mL | Freq: Once | INTRAVENOUS | Status: AC | PRN
  Administered 2022-12-28: 75 mL via INTRAVENOUS

## 2022-12-28 MED ORDER — MAGNESIUM SULFATE 2 GM/50ML IV SOLN
2.0000 g | Freq: Once | INTRAVENOUS | Status: AC
  Administered 2022-12-28: 2 g via INTRAVENOUS
  Filled 2022-12-28: qty 50

## 2022-12-28 MED ORDER — POTASSIUM CHLORIDE 10 MEQ/100ML IV SOLN
10.0000 meq | INTRAVENOUS | Status: AC
  Administered 2022-12-28 (×2): 10 meq via INTRAVENOUS
  Filled 2022-12-28 (×2): qty 100

## 2022-12-28 MED ORDER — POTASSIUM CHLORIDE CRYS ER 20 MEQ PO TBCR
40.0000 meq | EXTENDED_RELEASE_TABLET | Freq: Once | ORAL | Status: AC
  Administered 2022-12-28: 40 meq via ORAL
  Filled 2022-12-28: qty 2

## 2022-12-28 MED ORDER — MAGNESIUM OXIDE -MG SUPPLEMENT 400 (240 MG) MG PO TABS
400.0000 mg | ORAL_TABLET | Freq: Once | ORAL | Status: AC
  Administered 2022-12-28: 400 mg via ORAL
  Filled 2022-12-28: qty 1

## 2022-12-28 NOTE — ED Triage Notes (Signed)
Patient BIB EMS from [facility] due to witnessed syncopal episode. Patient states she did not feel bad leading up to even and has never passed out before. Patient family states she was out for about 10 minutes and was only responsive to pain. No hx seizures. Patient denies pain. Patient A&Ox4. Patient is cancer pt last chemo 3 weeks ago. Patient complains of nausea.

## 2022-12-28 NOTE — ED Notes (Signed)
Patient able to stand but reports light headed upon standing.

## 2022-12-28 NOTE — ED Notes (Signed)
Patient states they do not have a pacemaker

## 2022-12-28 NOTE — ED Provider Notes (Signed)
[place] EMERGENCY DEPARTMENT AT [facility] Provider Note   CSN: [id number] Arrival date & time: 12/28/22  1527     History  Chief Complaint  Patient presents with   Loss of Consciousness    Cynthia Velasquez is a 72 y.o. female.   Loss of Consciousness    72 year old female with medical history significant for HTN, HLD, GERD, neuropathy status post chemotherapy, migraine headaches, depression, anxiety, hiatal hernia, breast cancer, anal cancer currently undergoing chemotherapy who presents to the emergency department after an episode of syncope.  The history is provided by EMS and the patient.  Briefly, the patient was sitting on a park bench with her grandchildren outside this afternoon when she suddenly without a prodrome became unresponsive.  This lasted for around 10 minutes.  No CPR was provided.  No seizure-like activity noted.  Denied any chest pain, shortness of breath, lightheadedness prior to the episode.  She states that her last chemotherapy was 3 weeks ago.  She endorsed some nausea.  No recent cough.  She has since come back to her baseline mental status.  Unclear if the patient was postictal.  She states that she does not remember anything from the time she syncopized up until her presentation to the emergency department.  She does not remember much of the ambulance ride which may suggest a postictal state.  Additional hx obtained from the patient's daughter, Cynthia Velasquez [phone]. At the [place] today, [place], had a English as a second language teacher on. Was fiddling with her fingers. She was trying to look up but couldn't, while seated. Then passed out, unresponsive, still breathing at the time, had a pulse. EMS called. No hx of seizures.   Home Medications Prior to Admission medications   Medication Sig Start Date End Date Taking? Authorizing Provider  acyclovir (ZOVIRAX) 400 MG tablet Take 1 tablet (400 mg total) by mouth daily. 06/27/22   [name], MD   aspirin-acetaminophen-caffeine (EXCEDRIN MIGRAINE) [phone] MG tablet Take 2 tablets by mouth every 6 (six) hours as needed for headache.    [provider]  baclofen (LIORESAL) 10 MG tablet Take 1 tablet (10 mg total) by mouth 3 (three) times daily as needed for muscle spasms. 09/23/22   Henreitta Leber, MD  diphenoxylate-atropine (LOMOTIL) 2.5-0.025 MG tablet Take 2 tablets by mouth 4 (four) times daily as needed for diarrhea or loose stools. 11/05/22   Drema Dallas, MD  DULoxetine (CYMBALTA) 60 MG capsule Take 1 capsule (60 mg total) by mouth 2 (two) times daily. Take 1 capsule (30 mg) at bedtime for 1 week, if no side effects, then increase to 2 capsules (60 mg) thereafter. 12/20/22   Antony Madura, MD  lidocaine (LMX) 4 % cream Apply topically 2 (two) times daily as needed (pain). 11/05/22   Drema Dallas, MD  losartan (COZAAR) 100 MG tablet Take 100 mg by mouth daily. 01/14/17   [provider]  Menthol-Methyl Salicylate (SALONPAS PAIN RELIEF PATCH EX) Apply 1 patch topically daily as needed (pain).    [provider]  Nystatin (GERHARDT'S BUTT CREAM) CREA Apply 1 Application topically 2 (two) times daily. 11/05/22   Drema Dallas, MD  nystatin (MYCOSTATIN/NYSTOP) powder Apply 1 Application topically 3 (three) times daily. Patient taking differently: Apply 1 Application topically 3 (three) times daily as needed (redness, rash). 08/30/22   Walisiewicz, Kaitlyn E, PA-C  pantoprazole (PROTONIX) 40 MG tablet Take 1 tablet (40 mg total) by mouth daily. 11/06/22   Drema Dallas,  MD  prochlorperazine (COMPAZINE) 10 MG tablet Take 1 tablet (10 mg total) by mouth every 6 (six) hours as needed for nausea or vomiting. 06/27/22   [name], MD  saccharomyces boulardii (FLORASTOR) 250 MG capsule Take 1 capsule (250 mg total) by mouth 3 (three) times daily. 11/05/22   Drema Dallas, MD      Allergies    Tramadol, Codeine, and Vicodin [hydrocodone-acetaminophen]     Review of Systems   Review of Systems  Cardiovascular:  Positive for syncope.  Neurological:  Positive for syncope.  All other systems reviewed and are negative.   Physical Exam Updated Vital Signs BP (!) 157/107 (BP Location: Right Arm)   Pulse 85   Temp 99.5 F (37.5 C) (Oral)   Resp 17   Ht 5\' 5"  (1.651 m)   Wt 91 kg   SpO2 99%   BMI 33.38 kg/m  Physical Exam Vitals and nursing note reviewed.  Constitutional:      General: She is not in acute distress.    Appearance: She is well-developed.  HENT:     Head: Normocephalic and atraumatic.  Eyes:     Conjunctiva/sclera: Conjunctivae normal.  Cardiovascular:     Rate and Rhythm: Normal rate and regular rhythm.     Heart sounds: No murmur heard. Pulmonary:     Effort: Pulmonary effort is normal. No respiratory distress.     Breath sounds: Normal breath sounds.  Abdominal:     Palpations: Abdomen is soft.     Tenderness: There is no abdominal tenderness.  Musculoskeletal:        General: No swelling.     Cervical back: Neck supple.  Skin:    General: Skin is warm and dry.     Capillary Refill: Capillary refill takes less than 2 seconds.  Neurological:     Mental Status: She is alert.     Comments: MENTAL STATUS EXAM:    Orientation: Alert and oriented to person, place and time.  Memory: Cooperative, follows commands well.  Language: Speech is clear and language is normal.   CRANIAL NERVES:    CN 2 (Optic): Visual fields intact to confrontation.  CN 3,4,6 (EOM): Pupils equal and reactive to light. Full extraocular eye movement without nystagmus.  CN 5 (Trigeminal): Facial sensation is normal, no weakness of masticatory muscles.  CN 7 (Facial): No facial weakness or asymmetry.  CN 8 (Auditory): Auditory acuity grossly normal.  CN 9,10 (Glossophar): The uvula is midline, the palate elevates symmetrically.  CN 11 (spinal access): Normal sternocleidomastoid and trapezius strength.  CN 12 (Hypoglossal): The tongue  is midline. No atrophy or fasciculations.[name] Kitchen   MOTOR:  Muscle Strength: 5/5RUE, 5/5LUE, 5/5RLE, 5/5LLE.   COORDINATION:   Intact finger-to-nose, no tremor, no pronator drift.   SENSATION:   Intact to light touch all four extremities.     Psychiatric:        Mood and Affect: Mood normal.     ED Results / Procedures / Treatments   Labs (all labs ordered are listed, but only abnormal results are displayed) Labs Reviewed  CBC WITH DIFFERENTIAL/PLATELET - Abnormal; Notable for the following components:      Result Value   WBC 3.9 (*)    Hemoglobin 10.4 (*)    HCT 34.4 (*)    RDW 15.8 (*)    All other components within normal limits  COMPREHENSIVE METABOLIC PANEL - Abnormal; Notable for the following components:   Potassium 2.9 (*)  Glucose, Bld 102 (*)    Calcium 8.8 (*)    Total Protein 6.4 (*)    Albumin 3.1 (*)    All other components within normal limits  MAGNESIUM - Abnormal; Notable for the following components:   Magnesium 1.5 (*)    All other components within normal limits  CBG MONITORING, ED  TROPONIN I (HIGH SENSITIVITY)    EKG EKG Interpretation  Date/Time:  Saturday December 28 2022 15:41:56 EDT Ventricular Rate:  70 PR Interval:  173 QRS Duration: 79 QT Interval:  404 QTC Calculation: 436 R Axis:   29 Text Interpretation: Sinus rhythm Confirmed by Ernie Avena ([id number]) on 12/28/2022 4:01:06 PM  Radiology CT Angio Chest PE W and/or Wo Contrast  Result Date: 12/28/2022 CLINICAL DATA:  Syncopal episode. Undergoing chemotherapy for non-Hodgkin's lymphoma. High risk for pulmonary embolism. EXAM: CT ANGIOGRAPHY CHEST WITH CONTRAST TECHNIQUE: Multidetector CT imaging of the chest was performed using the standard protocol during bolus administration of intravenous contrast. Multiplanar CT image reconstructions and MIPs were obtained to evaluate the vascular anatomy. RADIATION DOSE REDUCTION: This exam was performed according to the departmental dose-optimization  program which includes automated exposure control, adjustment of the mA and/or kV according to patient size and/or use of iterative reconstruction technique. CONTRAST:  75mL OMNIPAQUE IOHEXOL 350 MG/ML SOLN COMPARISON:  PET-CT on 12/13/2022 FINDINGS: Cardiovascular: Satisfactory opacification of pulmonary arteries noted, and no pulmonary emboli identified. No evidence of thoracic aortic dissection or aneurysm. Aortic atherosclerotic calcification incidentally noted. Mediastinum/Nodes: No masses or pathologically enlarged lymph nodes identified within the thorax. Bilateral jugular and submandibular lymphadenopathy is seen in the visualized portion of the neck which appears increased since previous study. Lungs/Pleura: No pulmonary mass, infiltrate, or effusion. Mild atelectasis seen in the left lung base. Upper abdomen: No acute findings. Musculoskeletal: No suspicious bone lesions identified. Right shoulder prosthesis again seen causing streak artifact through the upper thorax. Review of the MIP images confirms the above findings. IMPRESSION: No evidence of pulmonary embolism. Mild left basilar atelectasis. Bilateral jugular and submandibular lymphadenopathy in the visualized portion of the neck, which appears increased since previous PET-CT. Consider neck CT contrast for further evaluation. Electronically Signed   By: Danae Orleans M.D.   On: 12/28/2022 17:58   CT HEAD WO CONTRAST ( )  Result Date: 12/28/2022 CLINICAL DATA:  Neuro deficit, acute, stroke suspected Syncope/presyncope, cerebrovascular cause suspected EXAM: CT HEAD WITHOUT CONTRAST TECHNIQUE: Contiguous axial images were obtained from the base of the skull through the vertex without intravenous contrast. RADIATION DOSE REDUCTION: This exam was performed according to the departmental dose-optimization program which includes automated exposure control, adjustment of the mA and/or kV according to patient size and/or use of iterative reconstruction  technique. COMPARISON:  MRI head 10/27/2021, CT head 04/04/2021 FINDINGS: Brain: No evidence of large-territorial acute infarction. No parenchymal hemorrhage. No mass lesion. No extra-axial collection. No mass effect or midline shift. No hydrocephalus. Basilar cisterns are patent. Vascular: No hyperdense vessel. Skull: No acute fracture or focal lesion. Sinuses/Orbits: Paranasal sinuses and mastoid air cells are clear. The orbits are unremarkable. Other: None. IMPRESSION: No acute intracranial abnormality. Electronically Signed   By: Tish Frederickson M.D.   On: 12/28/2022 17:42   DG Chest Port 1 View  Result Date: 12/28/2022 CLINICAL DATA:  Syncope EXAM: PORTABLE CHEST 1 VIEW COMPARISON:  Chest x-ray October 30, 2022 FINDINGS: Right chest wall port catheter in place, terminating near the superior cavoatrial junction. The cardiomediastinal silhouette is unchanged in contour. No focal pulmonary opacity. No  pleural effusion or pneumothorax. The visualized upper abdomen is unremarkable. Surgical clips projecting over bilateral axillae. Right shoulder arthroplasty in place. No acute osseous abnormality. IMPRESSION: No acute cardiopulmonary abnormality. Electronically Signed   By: Jacob Moores M.D.   On: 12/28/2022 17:04    Procedures Procedures    Medications Ordered in ED Medications  sodium chloride 0.9 % bolus 1,000 mL (has no administration in time range)  potassium chloride SA (KLOR-CON M) CR tablet 40 mEq (40 mEq Oral Given 12/28/22 1750)  potassium chloride 10 mEq in 100 mL IVPB (0 mEq Intravenous Stopped 12/28/22 1943)  iohexol (OMNIPAQUE) 350 MG/ML injection 75 mL (75 mLs Intravenous Contrast Given 12/28/22 1732)  magnesium oxide (MAG-OX) tablet 400 mg (400 mg Oral Given 12/28/22 1841)  magnesium sulfate IVPB 2 g 50 mL (0 g Intravenous Stopped 12/28/22 1940)  acetaminophen (TYLENOL) tablet 1,000 mg (1,000 mg Oral Given 12/28/22 1922)    ED Course/ Medical Decision Making/ A&P                              Medical Decision Making Amount and/or Complexity of Data Reviewed Labs: ordered. Radiology: ordered. ECG/medicine tests: ordered.  Risk OTC drugs. Prescription drug management. Decision regarding hospitalization.    72 year old female with medical history significant for HTN, HLD, GERD, neuropathy status post chemotherapy, migraine headaches, depression, anxiety, hiatal hernia, breast cancer, anal cancer currently undergoing chemotherapy who presents to the emergency department after an episode of syncope.  The history is provided by EMS and the patient.  Briefly, the patient was sitting on a park bench with her grandchildren outside this afternoon when she suddenly without a prodrome became unresponsive.  This lasted for around 10 minutes.  No CPR was provided.  No seizure-like activity noted.  Denied any chest pain, shortness of breath, lightheadedness prior to the episode.  She states that her last chemotherapy was 3 weeks ago.  She endorsed some nausea.  No recent cough.  She has since come back to her baseline mental status.  Unclear if the patient was postictal.  She states that she does not remember anything from the time she syncopized up until her presentation to the emergency department.  She does not remember much of the ambulance ride which may suggest a postictal state.  Additional hx obtained from the patient's daughter, Cynthia Velasquez [phone]. At the [place], had a English as a second language teacher on. Was fiddling with her fingers. She was trying to look up but couldn't, while seated. Then passed out, unresponsive, still breathing at the time, had a pulse. EMS called. No hx of seizures.   Currently, Cynthia Velasquez is awake, alert, and hemodynamically stable. On arrival, vitals were significant for afebrile, not tachycardic or tachypneic, BP 144/81, saturating 90% on room air, sinus rhythm noted on cardiac telemetry. I am most concerned for cardiac arrhythmia or electrolyte  abnormality or both. Considered PE. I do not think that this is due to ACS, heart valve abnormality such as aortic stenosis, HOCM, pneumothorax, aortic dissection, ruptured AAA, anemia, hypoglycemia, seizure, stroke, hypovolemia.  On exam, she has intact distal pulses in all extremities, normal capillary refill, a regular heart rate, and normal breath sounds. There is no carotid bruit, unusual abdominal mass, or heart murmur.  As per above, there are no focal neurologic deficits on exam.  As per above, the ECG reveals no evidence of acute ischemia, dysrhythmia, or syncope-associated finding. The CXR was both interpreted by  radiology and by myself. There is no evidence of cardiomegaly, pneumonia, pneumothorax, mediastinal widening, acute volume overload, fracture, or other acute, emergent intrathoracic pathology.    Laboratory valuation significant for nonspecific leukopenia to 3.9, anemia 10.4, hypomagnesemia was noted to 1.5, hypokalemia was noted to 2.9, initial troponin 5, CBG 194.  The patient's magnesium was replenished orally and IV and the patient's potassium was replenished orally and IV.  I provided an update to the patient's daughter over the phone.  CT head was performed which revealed no acute intracranial abnormality, no bleeding or mass noted.  CT angiogram PE study was performed revealed no acute PE.  I believe that the patient would benefit from ongoing observation and a TTE to evaluate for cardiac causes of syncope. Therefore, hospitalist medicine was consulted for admission.  I spoke with Dr. Allena Katz of [facility] hospitalist for admission for syncope observation in the setting of the patient's episode today.  Recommended CT angiogram of the head and neck given the patient's lymphadenopathy and mass to evaluate further.  Pending CT angiogram, plan for likely admission. Signout-given to Dr. Silverio Lay at 2100.   Final Clinical Impression(s) / ED Diagnoses Final diagnoses:  Syncope, unspecified  syncope type  Hypomagnesemia  Hypokalemia    Rx / DC Orders ED Discharge Orders     None         Ernie Avena, MD 12/28/22 2106

## 2022-12-29 ENCOUNTER — Emergency Department (HOSPITAL_COMMUNITY): Payer: PPO

## 2022-12-29 ENCOUNTER — Observation Stay (HOSPITAL_COMMUNITY): Payer: PPO

## 2022-12-29 ENCOUNTER — Other Ambulatory Visit (HOSPITAL_COMMUNITY): Payer: PRIVATE HEALTH INSURANCE

## 2022-12-29 DIAGNOSIS — I10 Essential (primary) hypertension: Secondary | ICD-10-CM | POA: Diagnosis not present

## 2022-12-29 DIAGNOSIS — E876 Hypokalemia: Secondary | ICD-10-CM

## 2022-12-29 DIAGNOSIS — C8448 Peripheral T-cell lymphoma, not classified, lymph nodes of multiple sites: Secondary | ICD-10-CM

## 2022-12-29 DIAGNOSIS — K219 Gastro-esophageal reflux disease without esophagitis: Secondary | ICD-10-CM

## 2022-12-29 DIAGNOSIS — R55 Syncope and collapse: Secondary | ICD-10-CM | POA: Diagnosis not present

## 2022-12-29 DIAGNOSIS — R569 Unspecified convulsions: Secondary | ICD-10-CM

## 2022-12-29 DIAGNOSIS — I6522 Occlusion and stenosis of left carotid artery: Secondary | ICD-10-CM | POA: Diagnosis not present

## 2022-12-29 LAB — BASIC METABOLIC PANEL
Anion gap: 4 — ABNORMAL LOW (ref 5–15)
BUN: 7 mg/dL — ABNORMAL LOW (ref 8–23)
CO2: 17 mmol/L — ABNORMAL LOW (ref 22–32)
Calcium: 6.5 mg/dL — ABNORMAL LOW (ref 8.9–10.3)
Chloride: 118 mmol/L — ABNORMAL HIGH (ref 98–111)
Creatinine, Ser: 0.66 mg/dL (ref 0.44–1.00)
GFR, Estimated: >60 mL/min (ref >60)
Glucose, Bld: 94 mg/dL (ref 70–99)
Potassium: 2.8 mmol/L — ABNORMAL LOW (ref 3.5–5.1)
Sodium: 139 mmol/L (ref 135–145)

## 2022-12-29 LAB — MAGNESIUM: Magnesium: 1.5 mg/dL — ABNORMAL LOW (ref 1.7–2.4)

## 2022-12-29 MED ORDER — ACETAMINOPHEN 325 MG PO TABS
650.0000 mg | ORAL_TABLET | Freq: Four times a day (QID) | ORAL | Status: DC | PRN
  Administered 2022-12-30: 650 mg via ORAL
  Filled 2022-12-29: qty 2

## 2022-12-29 MED ORDER — MAGNESIUM SULFATE 4 GM/100ML IV SOLN
4.0000 g | Freq: Once | INTRAVENOUS | Status: AC
  Administered 2022-12-29: 4 g via INTRAVENOUS
  Filled 2022-12-29: qty 100

## 2022-12-29 MED ORDER — LOSARTAN POTASSIUM 50 MG PO TABS
100.0000 mg | ORAL_TABLET | Freq: Every day | ORAL | Status: DC
  Administered 2022-12-29 – 2022-12-30 (×2): 100 mg via ORAL
  Filled 2022-12-29 (×2): qty 2

## 2022-12-29 MED ORDER — DULOXETINE HCL 60 MG PO CPEP
60.0000 mg | ORAL_CAPSULE | Freq: Two times a day (BID) | ORAL | Status: DC
  Administered 2022-12-29 – 2022-12-30 (×3): 60 mg via ORAL
  Filled 2022-12-29 (×3): qty 1

## 2022-12-29 MED ORDER — ONDANSETRON HCL 4 MG PO TABS: 4.0000 mg | ORAL_TABLET | Freq: Four times a day (QID) | ORAL | Status: DC | PRN

## 2022-12-29 MED ORDER — HEPARIN SODIUM (PORCINE) 5000 UNIT/ML IJ SOLN
5000.0000 [IU] | Freq: Three times a day (TID) | INTRAMUSCULAR | Status: DC
  Administered 2022-12-29 – 2022-12-30 (×5): 5000 [IU] via SUBCUTANEOUS
  Filled 2022-12-29 (×6): qty 1

## 2022-12-29 MED ORDER — ACETAMINOPHEN 650 MG RE SUPP: 650.0000 mg | Freq: Four times a day (QID) | RECTAL | Status: DC | PRN

## 2022-12-29 MED ORDER — ONDANSETRON HCL 4 MG/2ML IJ SOLN: 4.0000 mg | Freq: Four times a day (QID) | INTRAMUSCULAR | Status: DC | PRN

## 2022-12-29 MED ORDER — IOHEXOL 350 MG/ML SOLN
50.0000 mL | Freq: Once | INTRAVENOUS | Status: AC | PRN
  Administered 2022-12-29: 50 mL via INTRAVENOUS

## 2022-12-29 NOTE — Progress Notes (Signed)
EEG complete - results pending 

## 2022-12-29 NOTE — Subjective & Objective (Signed)
CC: syncope HPI: 72 year old African-American female history of hypertension, reflux, T-cell lymphoma, anal cancer, breast cancer presents to the ER today after having a syncopal episode while she was in the park with her family.  Patient states that she was sitting on the brick [place].  She does not remember any events.  She was told that she was sitting in the wall with her family.  She suddenly passed out.  She was lowered to the ground before she fell off the rock wall.  It took her about 10 minutes to wake up.  She was breathing.  She had a pulse.  Patient denies any recent illnesses.  She has not had any nausea, vomiting, diarrhea.  She does not use any diuretics.  She has been eating and drinking normally.  She has never had syncope before.  On arrival temp 98.6 heart rate 70 blood pressure 144/81 satting 97% on room air.  White count 3.9, hemoglobin 10.4, platelet 232  Magnesium 1.5  Sodium 141, potassium 2.9, chloride 105, bicarb 25, BUN of 10, creatinine 0.93, glucose 102  EKG in my interpretation showed normal sinus rhythm.  CT head showed no acute intracranial abnormality  CT angio chest was negative for PE.  This continue to show jugular and submandibular adenopathy consistent with her history of lymphoma.  CT angio head and neck negative for large vessel occlusion.  [facility] hospitalist contacted for admission.

## 2022-12-29 NOTE — ED Provider Notes (Signed)
Blood pressure (!) 180/98, pulse 70, temperature 98.8 F (37.1 C), temperature source Oral, resp. rate 19, height 5\' 5"  (1.651 m), weight 91 kg, SpO2 97 %.  Assuming care from Dr. Karene Fry.  In short, Cynthia Velasquez is a 72 y.o. female with a chief complaint of Loss of Consciousness .  Refer to the original H&P for additional details.  The current plan of care is to follow up on CTA head/neck.  CTA head/neck unremarkable for acute process. Discussed with Dr. Imogene Burn who will admit for syncope.     Maia Plan, MD 12/29/22 [phone]

## 2022-12-29 NOTE — Assessment & Plan Note (Signed)
Repleted with po and IV Kcl. Repeat BMP

## 2022-12-29 NOTE — Assessment & Plan Note (Signed)
Repleted with po and IV magnesium. Repeat serum Mg.

## 2022-12-29 NOTE — Assessment & Plan Note (Signed)
Stable. On cozaar. This would not make her hypokalemia.

## 2022-12-29 NOTE — ED Notes (Signed)
Patient transported to CT 

## 2022-12-29 NOTE — ED Notes (Signed)
Pt back from CT, in no acute distress. Pt walking to restroom at this time.

## 2022-12-29 NOTE — ED Notes (Signed)
ED TO INPATIENT HANDOFF REPORT  ED Nurse Name and Phone #: Jodie Echevaria 67  S Name/Age/Gender Cynthia Velasquez 72 y.o. female Room/Bed: 006C/006C  Code Status   Code Status: Full Code  Home/SNF/Other Home Patient oriented to: self, place, time, and situation Is this baseline? Yes   Triage Complete: Triage complete  Chief Complaint Syncope and collapse [R55]  Triage Note Patient BIB EMS from [facility] due to witnessed syncopal episode. Patient states she did not feel bad leading up to even and has never passed out before. Patient family states she was out for about 10 minutes and was only responsive to pain. No hx seizures. Patient denies pain. Patient A&Ox4. Patient is cancer pt last chemo 3 weeks ago. Patient complains of nausea.   Allergies Allergies  Allergen Reactions   Tramadol Itching and Rash   Codeine Itching and Rash   Vicodin [Hydrocodone-Acetaminophen] Itching and Rash    Level of Care/Admitting Diagnosis ED Disposition     ED Disposition  Admit   Condition  --   Comment  Hospital Area: [facility] [[id number]]  Level of Care: Telemetry Medical [104]  May place patient in observation at [facility] or [facility] if equivalent level of care is available:: No  Covid Evaluation: Asymptomatic - no recent exposure (last 10 days) testing not required  Diagnosis: Syncope and collapse [780.2.ICD-9-CM]  Admitting Physician: Imogene Burn, ERIC [[id number]]  Attending Physician: Imogene Burn, ERIC [[id number]]          B Medical/Surgery History Past Medical History:  Diagnosis Date   Anal cancer dx'd 05/2019   Anxiety    Arthritis    Bilateral breast cancer 10/21/2013   Breast cancer    Cancer    Depression    Gallstones    GERD (gastroesophageal reflux disease)    doesn't take any meds for this   Headache    h/o migraines       History of bladder infections    History of hiatal hernia    History of migraine    Hyperlipidemia    takes Simvastatin daily   Hypertension     takes Hyzaar   Joint pain    Joint swelling    Lumbar stenosis    Neuropathy 09/07/2013   Pneumonia    Pre-diabetes    Past Surgical History:  Procedure Laterality Date   ABDOMINAL HYSTERECTOMY     bone spur removed from left foot     CHOLECYSTECTOMY     COLONOSCOPY     COLONOSCOPY WITH PROPOFOL Bilateral 12/07/2021   Procedure: COLONOSCOPY WITH PROPOFOL;  Surgeon: Vida Rigger, MD;  Location: [facility];  Service: Endoscopy;  Laterality: Bilateral;   double mastectomy      ESOPHAGOGASTRODUODENOSCOPY N/A 12/07/2021   Procedure: ESOPHAGOGASTRODUODENOSCOPY (EGD);  Surgeon: Vida Rigger, MD;  Location: [facility];  Service: Endoscopy;  Laterality: N/A;   HERNIA REPAIR     umbilical   IR IMAGING GUIDED PORT INSERTION  06/25/2022   right knee arthroscopy     right shoulder arthroscopy     surgery for hiatal hernia     TONSILLECTOMY     TOTAL HIP ARTHROPLASTY Left 02/12/2013   TOTAL HIP ARTHROPLASTY Left 02/12/2013   Procedure: LEFT TOTAL HIP ARTHROPLASTY;  Surgeon: Nestor Lewandowsky, MD;  Location: [facility] OR;  Service: Orthopedics;  Laterality: Left;   TOTAL HIP ARTHROPLASTY Right 05/03/2013   Procedure: TOTAL HIP ARTHROPLASTY;  Surgeon: Nestor Lewandowsky, MD;  Location: [facility] OR;  Service: Orthopedics;  Laterality: Right;  TOTAL KNEE ARTHROPLASTY Right 02/27/2015   Procedure: TOTAL KNEE ARTHROPLASTY;  Surgeon: Gean Birchwood, MD;  Location: [facility] OR;  Service: Orthopedics;  Laterality: Right;   TOTAL SHOULDER ARTHROPLASTY Right 09/12/2016   Procedure: RIGHT TOTAL SHOULDER ARTHROPLASTY;  Surgeon: Jones Broom, MD;  Location: [facility] OR;  Service: Orthopedics;  Laterality: Right;  RIGHT TOTAL SHOULDER ARTHROPLASTY     A IV Location/Drains/Wounds Patient Lines/Drains/Airways Status     Active Line/Drains/Airways     Name Placement date Placement time Site Days   Implanted Port 06/25/22 Right Chest 06/25/22  1308  Chest  187   Peripheral IV 12/28/22 20 G Left Antecubital 12/28/22  --  Antecubital  1             Intake/Output Last 24 hours  Intake/Output Summary (Last 24 hours) at 12/29/2022 0610 Last data filed at 12/28/2022 2225 Gross per 24 hour  Intake 1250 ml  Output --  Net 1250 ml    Labs/Imaging Results for orders placed or performed during the hospital encounter of 12/28/22 (from the past 48 hour(s))  CBG monitoring, ED     Status: None   Collection Time: 12/28/22  3:45 PM  Result Value Ref Range   Glucose-Capillary 94 70 - 99 mg/dL    Comment: Glucose reference range applies only to samples taken after fasting for at least 8 hours.  CBC WITH DIFFERENTIAL     Status: Abnormal   Collection Time: 12/28/22  3:55 PM  Result Value Ref Range   WBC 3.9 (L) 4.0 - 10.5 K/uL   RBC 3.91 3.87 - 5.11 MIL/uL   Hemoglobin 10.4 (L) 12.0 - 15.0 g/dL   HCT 57.3 (L) 22.0 - 25.4 %   MCV 88.0 80.0 - 100.0 fL   MCH 26.6 26.0 - 34.0 pg   MCHC 30.2 30.0 - 36.0 g/dL   RDW 27.0 (H) 62.3 - 76.2 %   Platelets 232 150 - 400 K/uL   nRBC 0.0 0.0 - 0.2 %   Neutrophils Relative % 68 %   Neutro Abs 2.7 1.7 - 7.7 K/uL   Lymphocytes Relative 21 %   Lymphs Abs 0.8 0.7 - 4.0 K/uL   Monocytes Relative 10 %   Monocytes Absolute 0.4 0.1 - 1.0 K/uL   Eosinophils Relative 1 %   Eosinophils Absolute 0.0 0.0 - 0.5 K/uL   Basophils Relative 0 %   Basophils Absolute 0.0 0.0 - 0.1 K/uL   Immature Granulocytes 0 %   Abs Immature Granulocytes 0.01 0.00 - 0.07 K/uL    Comment: Performed at [facility], [address] [place], [place] [address]  Comprehensive metabolic panel     Status: Abnormal   Collection Time: 12/28/22  3:55 PM  Result Value Ref Range   Sodium 141 135 - 145 mmol/L   Potassium 2.9 (L) 3.5 - 5.1 mmol/L   Chloride 105 98 - 111 mmol/L   CO2 25 22 - 32 mmol/L   Glucose, Bld 102 (H) 70 - 99 mg/dL    Comment: Glucose reference range applies only to samples taken after fasting for at least 8 hours.   BUN 10 8 - 23 mg/dL   Creatinine, Ser 7.61 0.44 - 1.00 mg/dL   Calcium 8.8  (L) 8.9 - 10.3 mg/dL   Total Protein 6.4 (L) 6.5 - 8.1 g/dL   Albumin 3.1 (L) 3.5 - 5.0 g/dL   AST 21 15 - 41 U/L   ALT 10 0 - 44 U/L   Alkaline  Phosphatase 80 38 - 126 U/L   Total Bilirubin 0.7 0.3 - 1.2 mg/dL   GFR, Estimated >60 >09 mL/min    Comment: (NOTE) Calculated using the CKD-EPI Creatinine Equation (2021)    Anion gap 11 5 - 15    Comment: Performed at [facility], [address] [place], [place] [address]  Troponin I (High Sensitivity)     Status: None   Collection Time: 12/28/22  3:55 PM  Result Value Ref Range   Troponin I (High Sensitivity) 5 <18 ng/L    Comment: (NOTE) Elevated high sensitivity troponin I (hsTnI) values and significant  changes across serial measurements may suggest ACS but many other  chronic and acute conditions are known to elevate hsTnI results.  Refer to the "Links" section for chest pain algorithms and additional  guidance. Performed at [facility], [address] [place], [place] [address]   Magnesium     Status: Abnormal   Collection Time: 12/28/22  4:05 PM  Result Value Ref Range   Magnesium 1.5 (L) 1.7 - 2.4 mg/dL    Comment: Performed at [facility], [address], [place], [place] [address]  Basic metabolic panel     Status: Abnormal   Collection Time: 12/29/22  5:00 AM  Result Value Ref Range   Sodium 139 135 - 145 mmol/L   Potassium 2.8 (L) 3.5 - 5.1 mmol/L   Chloride 118 (H) 98 - 111 mmol/L   CO2 17 (L) 22 - 32 mmol/L   Glucose, Bld 94 70 - 99 mg/dL    Comment: Glucose reference range applies only to samples taken after fasting for at least 8 hours.   BUN 7 (L) 8 - 23 mg/dL   Creatinine, Ser 0.86 0.44 - 1.00 mg/dL   Calcium 6.5 (L) 8.9 - 10.3 mg/dL   GFR, Estimated >57 >84 mL/min    Comment: (NOTE) Calculated using the CKD-EPI Creatinine Equation (2021)    Anion gap 4 (L) 5 - 15    Comment: Performed at [facility], [address], [place], [place] [address]  Magnesium     Status: Abnormal    Collection Time: 12/29/22  5:00 AM  Result Value Ref Range   Magnesium 1.5 (L) 1.7 - 2.4 mg/dL    Comment: Performed at [facility], [address], [place], [place] [address]   CT ANGIO HEAD NECK W WO CM  Result Date: 12/29/2022 CLINICAL DATA:  Initial evaluation for lymphadenopathy, evaluate carotid blood flow. EXAM: CT ANGIOGRAPHY HEAD AND NECK WITH AND WITHOUT CONTRAST TECHNIQUE: Multidetector CT imaging of the head and neck was performed using the standard protocol during bolus administration of intravenous contrast. Multiplanar CT image reconstructions and MIPs were obtained to evaluate the vascular anatomy. Carotid stenosis measurements (when applicable) are obtained utilizing NASCET criteria, using the distal internal carotid diameter as the denominator. RADIATION DOSE REDUCTION: This exam was performed according to the departmental dose-optimization program which includes automated exposure control, adjustment of the mA and/or kV according to patient size and/or use of iterative reconstruction technique. CONTRAST:  50mL OMNIPAQUE IOHEXOL 350 MG/ML SOLN COMPARISON:  Comparison made with prior exams from 12/28/2022 and 12/13/2022. FINDINGS: Examination is somewhat technically limited as thick MIP reconstructions are not available for review at time of this dictation. Additionally, a 2 mm axial sequence through the neck is also unavailable for review. CTA NECK FINDINGS Aortic arch: Visualized aortic arch normal in caliber with standard 3 vessel  morphology. Minor atheromatous change about the arch itself. No significant stenosis about the origin the great vessels. Right carotid system: Right common and internal carotid arteries are tortuous without dissection or hemodynamically significant stenosis. Left carotid system: Left common and internal carotid arteries are tortuous without evidence for dissection. Mild eccentric plaque about the left carotid bulb without hemodynamically significant  stenosis. Vertebral arteries: Both vertebral arteries arise from subclavian arteries. No proximal subclavian artery stenosis. Both vertebral arteries are tortuous but patent without stenosis or dissection. Skeleton: No discrete or worrisome osseous lesions. Moderate degenerative spondylosis present at C3-4 through C6-7. Osteoarthritic changes noted about the TMJs bilaterally. Other neck: Multiple abnormal enlarged predominantly right cervical lymph nodes are seen, consistent with history of lymphoma. Largest of these is seen at level one B and measures 2.2 cm in short axis (series 7, image 197). An additional prominent right level 3 node measuring 2.1 cm in short access noted as well (series 7, image 224). Subtle central necrosis noted within this node. Associated swelling with hazy fat stranding within the adjacent right neck. On the left, a prominent left level 3 node measures 1.3 cm (series 7, image 226). Incidental 5 mm right thyroid nodule noted, of doubtful significance given size and patient age, no follow-up imaging recommended (ref: J Am Coll Radiol. 2015 Feb;12(2): 143-50). Upper chest: Hazy subsegmental atelectatic changes noted dependently within the visualized lung bases. Right-sided central venous catheter partially visualized. Visualized upper chest demonstrates no other acute finding. Review of the MIP images confirms the above findings CTA HEAD FINDINGS Anterior circulation: Both internal carotid arteries are patent to the termini without stenosis or other abnormality. A1 segments, anterior communicating artery complex common anterior cerebral arteries are patent without stenosis. No one stenosis or occlusion. No proximal MCA branch occlusion or high-grade stenosis. Distal MCA branches are perfused and symmetric. Posterior circulation: Both V4 segments are patent without stenosis. Right PICA patent at its origin. Left PICA not well seen. Basilar patent without stenosis. Superior cerebral arteries  patent bilaterally. Both PCAs primarily supplied via the basilar well perfused or distal aspects. Venous sinuses: Patent allowing for timing the contrast bolus. Anatomic variants: None significant.  No aneurysm. Review of the MIP images confirms the above findings IMPRESSION: 1. Negative CTA for large vessel occlusion or other emergent finding. No significant carotid narrowing or stenosis related to the patient's cervical adenopathy within the neck. 2. Multiple abnormal enlarged cervical lymph nodes, right greater than left, consistent with history of lymphoma. Aortic Atherosclerosis (ICD10-I70.0). Electronically Signed   By: Rise Mu M.D.   On: 12/29/2022 03:31   CT Angio Chest PE W and/or Wo Contrast  Result Date: 12/28/2022 CLINICAL DATA:  Syncopal episode. Undergoing chemotherapy for non-Hodgkin's lymphoma. High risk for pulmonary embolism. EXAM: CT ANGIOGRAPHY CHEST WITH CONTRAST TECHNIQUE: Multidetector CT imaging of the chest was performed using the standard protocol during bolus administration of intravenous contrast. Multiplanar CT image reconstructions and MIPs were obtained to evaluate the vascular anatomy. RADIATION DOSE REDUCTION: This exam was performed according to the departmental dose-optimization program which includes automated exposure control, adjustment of the mA and/or kV according to patient size and/or use of iterative reconstruction technique. CONTRAST:  75mL OMNIPAQUE IOHEXOL 350 MG/ML SOLN COMPARISON:  PET-CT on 12/13/2022 FINDINGS: Cardiovascular: Satisfactory opacification of pulmonary arteries noted, and no pulmonary emboli identified. No evidence of thoracic aortic dissection or aneurysm. Aortic atherosclerotic calcification incidentally noted. Mediastinum/Nodes: No masses or pathologically enlarged lymph nodes identified within the thorax. Bilateral jugular and submandibular lymphadenopathy  is seen in the visualized portion of the neck which appears increased since  previous study. Lungs/Pleura: No pulmonary mass, infiltrate, or effusion. Mild atelectasis seen in the left lung base. Upper abdomen: No acute findings. Musculoskeletal: No suspicious bone lesions identified. Right shoulder prosthesis again seen causing streak artifact through the upper thorax. Review of the MIP images confirms the above findings. IMPRESSION: No evidence of pulmonary embolism. Mild left basilar atelectasis. Bilateral jugular and submandibular lymphadenopathy in the visualized portion of the neck, which appears increased since previous PET-CT. Consider neck CT contrast for further evaluation. Electronically Signed   By: Danae Orleans M.D.   On: 12/28/2022 17:58   CT HEAD WO CONTRAST ( )  Result Date: 12/28/2022 CLINICAL DATA:  Neuro deficit, acute, stroke suspected Syncope/presyncope, cerebrovascular cause suspected EXAM: CT HEAD WITHOUT CONTRAST TECHNIQUE: Contiguous axial images were obtained from the base of the skull through the vertex without intravenous contrast. RADIATION DOSE REDUCTION: This exam was performed according to the departmental dose-optimization program which includes automated exposure control, adjustment of the mA and/or kV according to patient size and/or use of iterative reconstruction technique. COMPARISON:  MRI head 10/27/2021, CT head 04/04/2021 FINDINGS: Brain: No evidence of large-territorial acute infarction. No parenchymal hemorrhage. No mass lesion. No extra-axial collection. No mass effect or midline shift. No hydrocephalus. Basilar cisterns are patent. Vascular: No hyperdense vessel. Skull: No acute fracture or focal lesion. Sinuses/Orbits: Paranasal sinuses and mastoid air cells are clear. The orbits are unremarkable. Other: None. IMPRESSION: No acute intracranial abnormality. Electronically Signed   By: Tish Frederickson M.D.   On: 12/28/2022 17:42   DG Chest Port 1 View  Result Date: 12/28/2022 CLINICAL DATA:  Syncope EXAM: PORTABLE CHEST 1 VIEW COMPARISON:   Chest x-ray October 30, 2022 FINDINGS: Right chest wall port catheter in place, terminating near the superior cavoatrial junction. The cardiomediastinal silhouette is unchanged in contour. No focal pulmonary opacity. No pleural effusion or pneumothorax. The visualized upper abdomen is unremarkable. Surgical clips projecting over bilateral axillae. Right shoulder arthroplasty in place. No acute osseous abnormality. IMPRESSION: No acute cardiopulmonary abnormality. Electronically Signed   By: Jacob Moores M.D.   On: 12/28/2022 17:04    Pending Labs Unresulted Labs (From admission, onward)    None       Vitals/Pain Today's Vitals   12/29/22 0245 12/29/22 0300 12/29/22 0323 12/29/22 0330  BP:   (!) 180/98   Pulse: 66 67  70  Resp: Temp:   98.8 F (37.1 C)   TempSrc:   Oral   SpO2: 100% 99%  97%  Weight:      Height:      PainSc:        Isolation Precautions No active isolations  Medications Medications  potassium chloride SA (KLOR-CON M) CR tablet 40 mEq (40 mEq Oral Given 12/28/22 1750)  potassium chloride 10 mEq in 100 mL IVPB (0 mEq Intravenous Stopped 12/28/22 1943)  iohexol (OMNIPAQUE) 350 MG/ML injection 75 mL (75 mLs Intravenous Contrast Given 12/28/22 1732)  magnesium oxide (MAG-OX) tablet 400 mg (400 mg Oral Given 12/28/22 1841)  magnesium sulfate IVPB 2 g 50 mL (0 g Intravenous Stopped 12/28/22 1940)  acetaminophen (TYLENOL) tablet 1,000 mg (1,000 mg Oral Given 12/28/22 1922)  sodium chloride 0.9 % bolus 1,000 mL (0 mLs Intravenous Stopped 12/28/22 2225)  iohexol (OMNIPAQUE) 350 MG/ML injection 50 mL (50 mLs Intravenous Contrast Given 12/29/22 0206)    Mobility walks     Focused Assessments Cardiac  Assessment Handoff:    No results found for: "CKTOTAL", "CKMB", "CKMBINDEX", "TROPONINI" No results found for: "DDIMER" Does the Patient currently have chest pain? No   , Neuro Assessment Handoff:  Swallow screen pass? Yes    NIH Stroke Scale  Level  of Consciousness (1a.)   : Alert, keenly responsive LOC Questions (1b. )   : Answers both questions correctly LOC Commands (1c. )   : Performs both tasks correctly Best Gaze (2. )  : Normal Visual (3. )  : No visual loss Facial Palsy (4. )    : Normal symmetrical movements Motor Arm, Left (5a. )   : No drift Motor Arm, Right (5b. ) : No drift Motor Leg, Left (6a. )  : No drift Motor Leg, Right (6b. ) : No drift Limb Ataxia (7. ): Absent Sensory (8. )  : Mild-to-moderate sensory loss, patient feels pinprick is less sharp or is dull on the affected side, or there is a loss of superficial pain with pinprick, but patient is aware of being touched (Left leg feels different than right.) Best Language (9. )  : No aphasia Dysarthria (10. ): Normal Extinction/Inattention (11.)   : No Abnormality Complete NIHSS TOTAL: 1     Neuro Assessment: Exceptions to WDL Neuro Checks:      Has TPA been given? No If patient is a Neuro Trauma and patient is going to OR before floor call report to 4N Charge nurse: [phone] or [phone]  , Pulmonary Assessment Handoff:  Lung sounds:   O2 Device: Room Air      R Recommendations: See Admitting Provider Note  Report given to:   Additional Notes: N/a

## 2022-12-29 NOTE — Progress Notes (Addendum)
No charge note:  Patient is admitted early this morning for syncope without prodrome, detail please see HPI.  She reports one episode of diarrhea, denies ab pain. She reports LOC for about , then confused afterward for about 1hr, daughter states her eyes rolled back , she was trying to talk initially but not able to make sounds. She denies h/o seizure, will get EEG.  Currently vital signs are stable, with slight elevated blood pressure.  K2.8, mag 1.5, telemetry sinus rhythm.  CTA no PE, CT head no acute finding, no large vessel occlusion on CTA head neck.  Replace K mag, recheck in the morning ,continue telemetry, consider outpatient cardiac monitoring.

## 2022-12-29 NOTE — Assessment & Plan Note (Signed)
Followed by oncology 

## 2022-12-29 NOTE — H&P (Signed)
History and Physical    Cynthia Velasquez ZOX:[id number] DOB: 01-12-1951 DOA: 12/28/2022  DOS: the patient was seen and examined on 12/28/2022  PCP: [name], MD   Patient coming from: Home  I have personally briefly reviewed patient's old medical records in [facility]  CC: syncope HPI: 72 year old African-American female history of hypertension, reflux, T-cell lymphoma, anal cancer, breast cancer presents to the ER today after having a syncopal episode while she was in the park with her family.  Patient states that she was sitting on the brick wall that [place].  She does not remember any events.  She was told that she was sitting in the wall with her family.  She suddenly passed out.  She was lowered to the ground before she fell off the rock wall.  It took her about 10 minutes to wake up.  She was breathing.  She had a pulse.  Patient denies any recent illnesses.  She has not had any nausea, vomiting, diarrhea.  She does not use any diuretics.  She has been eating and drinking normally.  She has never had syncope before.  On arrival temp 98.6 heart rate 70 blood pressure 144/81 satting 97% on room air.  White count 3.9, hemoglobin 10.4, platelet 232  Magnesium 1.5  Sodium 141, potassium 2.9, chloride 105, bicarb 25, BUN of 10, creatinine 0.93, glucose 102  EKG in my interpretation showed normal sinus rhythm.  CT head showed no acute intracranial abnormality  CT angio chest was negative for PE.  This continue to show jugular and submandibular adenopathy consistent with her history of lymphoma.  CT angio head and neck negative for large vessel occlusion.  [facility] contacted for admission.   ED Course: CT head negative for CVA. CTPA negative for PE. Mg 1.5, K 2.9  Review of Systems:  Review of Systems  Constitutional: Negative.   HENT: Negative.    Eyes: Negative.   Respiratory: Negative.    Cardiovascular:  Negative for  chest pain and palpitations.       Sudden syncope. No prodrome  Gastrointestinal: Negative.  Negative for abdominal pain, diarrhea, nausea and vomiting.  Genitourinary: Negative.   Musculoskeletal: Negative.   Skin: Negative.   Neurological:        Chronic neuropathy in hands/feet related to carpal tunnel and chemo-induced neuropathy  Endo/Heme/Allergies: Negative.   Psychiatric/Behavioral: Negative.    All other systems reviewed and are negative.   Past Medical History:  Diagnosis Date   Anal cancer dx'd 05/2019   Anxiety    Arthritis    Bilateral breast cancer 10/21/2013   Breast cancer    Cancer    Depression    Gallstones    GERD (gastroesophageal reflux disease)    doesn't take any meds for this   Headache    h/o migraines       History of bladder infections    History of hiatal hernia    History of migraine    Hyperlipidemia    takes Simvastatin daily   Hypertension    takes Hyzaar   Joint pain    Joint swelling    Lumbar stenosis    Neuropathy 09/07/2013   Pneumonia    Pre-diabetes     Past Surgical History:  Procedure Laterality Date   ABDOMINAL HYSTERECTOMY     bone spur removed from left foot     CHOLECYSTECTOMY     COLONOSCOPY     COLONOSCOPY WITH PROPOFOL Bilateral 12/07/2021  Procedure: COLONOSCOPY WITH PROPOFOL;  Surgeon: Vida Rigger, MD;  Location: [facility];  Service: Endoscopy;  Laterality: Bilateral;   double mastectomy      ESOPHAGOGASTRODUODENOSCOPY N/A 12/07/2021   Procedure: ESOPHAGOGASTRODUODENOSCOPY (EGD);  Surgeon: Vida Rigger, MD;  Location: [facility];  Service: Endoscopy;  Laterality: N/A;   HERNIA REPAIR     umbilical   IR IMAGING GUIDED PORT INSERTION  06/25/2022   right knee arthroscopy     right shoulder arthroscopy     surgery for hiatal hernia     TONSILLECTOMY     TOTAL HIP ARTHROPLASTY Left 02/12/2013   TOTAL HIP ARTHROPLASTY Left 02/12/2013   Procedure: LEFT TOTAL HIP ARTHROPLASTY;  Surgeon: Nestor Lewandowsky, MD;   Location: [facility] OR;  Service: Orthopedics;  Laterality: Left;   TOTAL HIP ARTHROPLASTY Right 05/03/2013   Procedure: TOTAL HIP ARTHROPLASTY;  Surgeon: Nestor Lewandowsky, MD;  Location: [facility] OR;  Service: Orthopedics;  Laterality: Right;   TOTAL KNEE ARTHROPLASTY Right 02/27/2015   Procedure: TOTAL KNEE ARTHROPLASTY;  Surgeon: Gean Birchwood, MD;  Location: [facility] OR;  Service: Orthopedics;  Laterality: Right;   TOTAL SHOULDER ARTHROPLASTY Right 09/12/2016   Procedure: RIGHT TOTAL SHOULDER ARTHROPLASTY;  Surgeon: Jones Broom, MD;  Location: [facility] OR;  Service: Orthopedics;  Laterality: Right;  RIGHT TOTAL SHOULDER ARTHROPLASTY     reports that she has never smoked. She has never used smokeless tobacco. She reports that she does not drink alcohol and does not use drugs.  Allergies  Allergen Reactions   Tramadol Itching and Rash   Codeine Itching and Rash   Vicodin [Hydrocodone-Acetaminophen] Itching and Rash    Family History  Adopted: Yes  Problem Relation Age of Onset   Allergic rhinitis Neg Hx    Angioedema Neg Hx    Atopy Neg Hx    Eczema Neg Hx    Immunodeficiency Neg Hx    Urticaria Neg Hx    Asthma Neg Hx     Prior to Admission medications   Medication Sig Start Date End Date Taking? Authorizing Provider  aspirin-acetaminophen-caffeine (EXCEDRIN MIGRAINE) [phone] MG tablet Take 2 tablets by mouth every 6 (six) hours as needed for migraine.   Yes [provider]  diclofenac (VOLTAREN) 75 MG EC tablet Take 75 mg by mouth 2 (two) times daily as needed for mild pain.   Yes [provider]  diphenoxylate-atropine (LOMOTIL) 2.5-0.025 MG tablet Take 2 tablets by mouth 4 (four) times daily as needed for diarrhea or loose stools. 11/05/22  Yes Drema Dallas, MD  DULoxetine (CYMBALTA) 60 MG capsule Take 1 capsule (60 mg total) by mouth 2 (two) times daily. Take 1 capsule (30 mg) at bedtime for 1 week, if no side effects, then increase to 2 capsules (60 mg) thereafter. 12/20/22  Yes  Antony Madura, MD  lidocaine (LMX) 4 % cream Apply topically 2 (two) times daily as needed (pain). 11/05/22  Yes Drema Dallas, MD  losartan (COZAAR) 100 MG tablet Take 100 mg by mouth daily. 01/14/17  Yes [provider]  Menthol-Methyl Salicylate (SALONPAS PAIN RELIEF PATCH EX) Apply 1 patch topically daily as needed (pain).   Yes [provider]  methocarbamol (ROBAXIN) 750 MG tablet Take 750 mg by mouth every 6 (six) hours as needed for muscle spasms. 12/17/22  Yes [provider]  Nystatin (GERHARDT'S BUTT CREAM) CREA Apply 1 Application topically 2 (two) times daily. Patient taking differently: Apply 1 Application topically 2 (two) times daily as needed for irritation.  11/05/22  Yes Drema Dallas, MD  nystatin (MYCOSTATIN/NYSTOP) powder Apply 1 Application topically 3 (three) times daily. Patient taking differently: Apply 1 Application topically 3 (three) times daily as needed (redness, rash). 08/30/22  Yes Walisiewicz, Kaitlyn E, PA-C  pantoprazole (PROTONIX) 40 MG tablet Take 1 tablet (40 mg total) by mouth daily. Patient taking differently: Take 40 mg by mouth daily as needed (for acid reflux). 11/06/22  Yes Drema Dallas, MD  prochlorperazine (COMPAZINE) 10 MG tablet Take 1 tablet (10 mg total) by mouth every 6 (six) hours as needed for nausea or vomiting. 06/27/22  Yes [name], MD  acyclovir (ZOVIRAX) 400 MG tablet Take 1 tablet (400 mg total) by mouth daily. Patient not taking: Reported on 12/28/2022 06/27/22   [name], MD  saccharomyces boulardii (FLORASTOR) 250 MG capsule Take 1 capsule (250 mg total) by mouth 3 (three) times daily. Patient not taking: Reported on 12/28/2022 11/05/22   Drema Dallas, MD    Physical Exam: Vitals:   12/29/22 0245 12/29/22 0300 12/29/22 0323 12/29/22 0330  BP:   (!) 180/98   Pulse: 66 67  70  Resp: Temp:   98.8 F (37.1 C)   TempSrc:   Oral   SpO2: 100% 99%  97%  Weight:       Height:        Physical Exam Vitals and nursing note reviewed.  Constitutional:      General: She is not in acute distress.    Appearance: Normal appearance. She is normal weight. She is not ill-appearing, toxic-appearing or diaphoretic.  HENT:     Head: Normocephalic and atraumatic.     Nose: Nose normal.  Eyes:     General: No scleral icterus. Cardiovascular:     Rate and Rhythm: Normal rate and regular rhythm.     Pulses: Normal pulses.     Heart sounds: Murmur heard.  Pulmonary:     Effort: Pulmonary effort is normal.     Breath sounds: Normal breath sounds.  Abdominal:     General: Abdomen is flat. Bowel sounds are normal. There is no distension.     Palpations: Abdomen is soft.  Musculoskeletal:     Right lower leg: No edema.     Left lower leg: No edema.  Skin:    General: Skin is warm and dry.     Capillary Refill: Capillary refill takes less than 2 seconds.  Neurological:     General: No focal deficit present.     Mental Status: She is alert and oriented to person, place, and time.      Labs on Admission: I have personally reviewed following labs and imaging studies  CBC: Recent Labs  Lab 12/28/22 1555  WBC 3.9*  NEUTROABS 2.7  HGB 10.4*  HCT 34.4*  MCV 88.0  PLT 232   Basic Metabolic Panel: Recent Labs  Lab 12/28/22 1555 12/28/22 1605  NA 141  --   K 2.9*  --   CL 105  --   CO2 25  --   GLUCOSE 102*  --   BUN 10  --   CREATININE 0.93  --   CALCIUM 8.8*  --   MG  --  1.5*   GFR: Estimated Creatinine Clearance: 61.8 mL/min (by C-G formula based on SCr of 0.93 mg/dL). Liver Function Tests: Recent Labs  Lab 12/28/22 1555  AST 21  ALT 10  ALKPHOS 80  BILITOT 0.7  PROT  6.4*  ALBUMIN 3.1*    Cardiac Enzymes: Recent Labs  Lab 12/28/22 1555  TROPONINIHS 5   CBG: Recent Labs  Lab 12/28/22 1545  GLUCAP 94   Radiological Exams on Admission: I have personally reviewed images CT ANGIO HEAD NECK W WO CM  Result Date:  12/29/2022 CLINICAL DATA:  Initial evaluation for lymphadenopathy, evaluate carotid blood flow. EXAM: CT ANGIOGRAPHY HEAD AND NECK WITH AND WITHOUT CONTRAST TECHNIQUE: Multidetector CT imaging of the head and neck was performed using the standard protocol during bolus administration of intravenous contrast. Multiplanar CT image reconstructions and MIPs were obtained to evaluate the vascular anatomy. Carotid stenosis measurements (when applicable) are obtained utilizing NASCET criteria, using the distal internal carotid diameter as the denominator. RADIATION DOSE REDUCTION: This exam was performed according to the departmental dose-optimization program which includes automated exposure control, adjustment of the mA and/or kV according to patient size and/or use of iterative reconstruction technique. CONTRAST:  61mL OMNIPAQUE IOHEXOL 350 MG/ML SOLN COMPARISON:  Comparison made with prior exams from 12/28/2022 and 12/13/2022. FINDINGS: Examination is somewhat technically limited as thick MIP reconstructions are not available for review at time of this dictation. Additionally, a 2 mm axial sequence through the neck is also unavailable for review. CTA NECK FINDINGS Aortic arch: Visualized aortic arch normal in caliber with standard 3 vessel morphology. Minor atheromatous change about the arch itself. No significant stenosis about the origin the great vessels. Right carotid system: Right common and internal carotid arteries are tortuous without dissection or hemodynamically significant stenosis. Left carotid system: Left common and internal carotid arteries are tortuous without evidence for dissection. Mild eccentric plaque about the left carotid bulb without hemodynamically significant stenosis. Vertebral arteries: Both vertebral arteries arise from subclavian arteries. No proximal subclavian artery stenosis. Both vertebral arteries are tortuous but patent without stenosis or dissection. Skeleton: No discrete or  worrisome osseous lesions. Moderate degenerative spondylosis present at C3-4 through C6-7. Osteoarthritic changes noted about the TMJs bilaterally. Other neck: Multiple abnormal enlarged predominantly right cervical lymph nodes are seen, consistent with history of lymphoma. Largest of these is seen at level one B and measures 2.2 cm in short axis (series 7, image 197). An additional prominent right level 3 node measuring 2.1 cm in short access noted as well (series 7, image 224). Subtle central necrosis noted within this node. Associated swelling with hazy fat stranding within the adjacent right neck. On the left, a prominent left level 3 node measures 1.3 cm (series 7, image 226). Incidental 5 mm right thyroid nodule noted, of doubtful significance given size and patient age, no follow-up imaging recommended (ref: J Am Coll Radiol. 2015 Feb;12(2): 143-50). Upper chest: Hazy subsegmental atelectatic changes noted dependently within the visualized lung bases. Right-sided central venous catheter partially visualized. Visualized upper chest demonstrates no other acute finding. Review of the MIP images confirms the above findings CTA HEAD FINDINGS Anterior circulation: Both internal carotid arteries are patent to the termini without stenosis or other abnormality. A1 segments, anterior communicating artery complex common anterior cerebral arteries are patent without stenosis. No one stenosis or occlusion. No proximal MCA branch occlusion or high-grade stenosis. Distal MCA branches are perfused and symmetric. Posterior circulation: Both V4 segments are patent without stenosis. Right PICA patent at its origin. Left PICA not well seen. Basilar patent without stenosis. Superior cerebral arteries patent bilaterally. Both PCAs primarily supplied via the basilar well perfused or distal aspects. Venous sinuses: Patent allowing for timing the contrast bolus. Anatomic variants: None significant.  No aneurysm. Review of the MIP  images confirms the above findings IMPRESSION: 1. Negative CTA for large vessel occlusion or other emergent finding. No significant carotid narrowing or stenosis related to the patient's cervical adenopathy within the neck. 2. Multiple abnormal enlarged cervical lymph nodes, right greater than left, consistent with history of lymphoma. Aortic Atherosclerosis (ICD10-I70.0). Electronically Signed   By: Rise Mu M.D.   On: 12/29/2022 03:31   CT Angio Chest PE W and/or Wo Contrast  Result Date: 12/28/2022 CLINICAL DATA:  Syncopal episode. Undergoing chemotherapy for non-Hodgkin's lymphoma. High risk for pulmonary embolism. EXAM: CT ANGIOGRAPHY CHEST WITH CONTRAST TECHNIQUE: Multidetector CT imaging of the chest was performed using the standard protocol during bolus administration of intravenous contrast. Multiplanar CT image reconstructions and MIPs were obtained to evaluate the vascular anatomy. RADIATION DOSE REDUCTION: This exam was performed according to the departmental dose-optimization program which includes automated exposure control, adjustment of the mA and/or kV according to patient size and/or use of iterative reconstruction technique. CONTRAST:  75mL OMNIPAQUE IOHEXOL 350 MG/ML SOLN COMPARISON:  PET-CT on 12/13/2022 FINDINGS: Cardiovascular: Satisfactory opacification of pulmonary arteries noted, and no pulmonary emboli identified. No evidence of thoracic aortic dissection or aneurysm. Aortic atherosclerotic calcification incidentally noted. Mediastinum/Nodes: No masses or pathologically enlarged lymph nodes identified within the thorax. Bilateral jugular and submandibular lymphadenopathy is seen in the visualized portion of the neck which appears increased since previous study. Lungs/Pleura: No pulmonary mass, infiltrate, or effusion. Mild atelectasis seen in the left lung base. Upper abdomen: No acute findings. Musculoskeletal: No suspicious bone lesions identified. Right shoulder  prosthesis again seen causing streak artifact through the upper thorax. Review of the MIP images confirms the above findings. IMPRESSION: No evidence of pulmonary embolism. Mild left basilar atelectasis. Bilateral jugular and submandibular lymphadenopathy in the visualized portion of the neck, which appears increased since previous PET-CT. Consider neck CT contrast for further evaluation. Electronically Signed   By: Danae Orleans M.D.   On: 12/28/2022 17:58   CT HEAD WO CONTRAST ( )  Result Date: 12/28/2022 CLINICAL DATA:  Neuro deficit, acute, stroke suspected Syncope/presyncope, cerebrovascular cause suspected EXAM: CT HEAD WITHOUT CONTRAST TECHNIQUE: Contiguous axial images were obtained from the base of the skull through the vertex without intravenous contrast. RADIATION DOSE REDUCTION: This exam was performed according to the departmental dose-optimization program which includes automated exposure control, adjustment of the mA and/or kV according to patient size and/or use of iterative reconstruction technique. COMPARISON:  MRI head 10/27/2021, CT head 04/04/2021 FINDINGS: Brain: No evidence of large-territorial acute infarction. No parenchymal hemorrhage. No mass lesion. No extra-axial collection. No mass effect or midline shift. No hydrocephalus. Basilar cisterns are patent. Vascular: No hyperdense vessel. Skull: No acute fracture or focal lesion. Sinuses/Orbits: Paranasal sinuses and mastoid air cells are clear. The orbits are unremarkable. Other: None. IMPRESSION: No acute intracranial abnormality. Electronically Signed   By: Tish Frederickson M.D.   On: 12/28/2022 17:42   DG Chest Port 1 View  Result Date: 12/28/2022 CLINICAL DATA:  Syncope EXAM: PORTABLE CHEST 1 VIEW COMPARISON:  Chest x-ray October 30, 2022 FINDINGS: Right chest wall port catheter in place, terminating near the superior cavoatrial junction. The cardiomediastinal silhouette is unchanged in contour. No focal pulmonary opacity. No  pleural effusion or pneumothorax. The visualized upper abdomen is unremarkable. Surgical clips projecting over bilateral axillae. Right shoulder arthroplasty in place. No acute osseous abnormality. IMPRESSION: No acute cardiopulmonary abnormality. Electronically Signed   By: Jacob Moores M.D.   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 Given sudden onset, will need to presume this is cardiac. Pt denies any recent N/V/D. Does not use any diuretics. This is first time she has had syncope. Doubt that ziopatch would be of any benefit. Given her chemo for lymphoma including vincristine, will check limited echo. Pt had echo in 06-2022 that showed normal LVEF.  Hypomagnesemia Repleted with po and IV magnesium. Repeat serum Mg.  Hypokalemia Repleted with po and IV Kcl. Repeat BMP  GERD without esophagitis On protonix. Could be the reason for her hypomagnesemia.  Essential hypertension Stable. On cozaar. This would not make her hypokalemia.  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   DVT prophylaxis: SQ Heparin Code Status: Full Code Family Communication: no family at bedside  Disposition Plan: return home  Consults called: none  Admission status: Observation, Telemetry bed   Carollee Herter, DO [facility] 12/29/2022, 4:19 AM

## 2022-12-29 NOTE — Assessment & Plan Note (Addendum)
Suspected vagal syncope in the setting of hypokalemia and hypomagnesemia.  EKG with no intervals prolongation or features of Brugada syndrome. Echocardiogram with preserved LV systolic function with EF 60 to 65%, mild LVH, RV not well visualized, RVSP 22.4 mmHg.  No evidence of pericardial effusion. No significant valvular disease.  Systolic blood pressure 140 to 170 mmHg.  Plan to discharge home, follow up with Dr Candise Che from oncology as scheduled this week. Possible vagal stimulation from cervical lymphadenopathy. Advised not to drive until syncope free for at least 6 months.

## 2022-12-29 NOTE — Procedures (Signed)
Patient Name: Lameika Qasem  MRN: [id number]  Epilepsy Attending: Charlsie Quest  Referring Physician/Provider: Albertine Grates, MD  Date: 12/29/2022 Duration: 26.09 mins  Patient history: 72yo F with syncope. EEG to evaluate for seizure.  Level of alertness: Awake  AEDs during EEG study: None  Technical aspects: This EEG study was done with scalp electrodes positioned according to the 10-20 International system of electrode placement. Electrical activity was reviewed with band pass filter of 1-70Hz , sensitivity of 7 uV/mm, display speed of 52mm/sec with a 60Hz  notched filter applied as appropriate. EEG data were recorded continuously and digitally stored.  Video monitoring was available and reviewed as appropriate.  Description: The posterior dominant rhythm consists of 9-10 Hz activity of moderate voltage (25-35 uV) seen predominantly in posterior head regions, symmetric and reactive to eye opening and eye closing. Hyperventilation did not show any EEG change.  Physiologic photic driving was not seen during photic stimulation.    IMPRESSION: This study is within normal limits. No seizures or epileptiform discharges were seen throughout the recording.  A normal interictal EEG does not exclude the diagnosis of epilepsy.   Janara Klett Annabelle Harman

## 2022-12-29 NOTE — Assessment & Plan Note (Signed)
On protonix. Could be the reason for her hypomagnesemia.

## 2022-12-29 NOTE — ED Notes (Signed)
Pt report received from previous nurse. Pt A&O x4, vitals stable, denies needs/complaints. Call bell in reach. No acute distress noted.  

## 2022-12-30 ENCOUNTER — Observation Stay (HOSPITAL_BASED_OUTPATIENT_CLINIC_OR_DEPARTMENT_OTHER): Payer: PPO

## 2022-12-30 DIAGNOSIS — E876 Hypokalemia: Secondary | ICD-10-CM | POA: Diagnosis not present

## 2022-12-30 DIAGNOSIS — R55 Syncope and collapse: Secondary | ICD-10-CM

## 2022-12-30 LAB — BASIC METABOLIC PANEL
Anion gap: 10 (ref 5–15)
BUN: 7 mg/dL — ABNORMAL LOW (ref 8–23)
CO2: 22 mmol/L (ref 22–32)
Calcium: 8.7 mg/dL — ABNORMAL LOW (ref 8.9–10.3)
Chloride: 104 mmol/L (ref 98–111)
Creatinine, Ser: 0.81 mg/dL (ref 0.44–1.00)
GFR, Estimated: >60 mL/min (ref >60)
Glucose, Bld: 102 mg/dL — ABNORMAL HIGH (ref 70–99)
Potassium: 3.3 mmol/L — ABNORMAL LOW (ref 3.5–5.1)
Sodium: 136 mmol/L (ref 135–145)

## 2022-12-30 LAB — ECHOCARDIOGRAM LIMITED
Calc EF: 68.2 %
Height: 65 in
S' Lateral: 1.9 cm
Single Plane A2C EF: 67.7 %
Single Plane A4C EF: 68.7 %
Weight: 3209.9 oz

## 2022-12-30 LAB — CBC WITH DIFFERENTIAL/PLATELET
Abs Immature Granulocytes: 0.01 10*3/uL (ref 0.00–0.07)
Basophils Absolute: 0 10*3/uL (ref 0.0–0.1)
Basophils Relative: 0 %
Eosinophils Absolute: 0.1 10*3/uL (ref 0.0–0.5)
Eosinophils Relative: 2 %
HCT: 31.4 % — ABNORMAL LOW (ref 36.0–46.0)
Hemoglobin: 10.1 g/dL — ABNORMAL LOW (ref 12.0–15.0)
Immature Granulocytes: 0 %
Lymphocytes Relative: 31 %
Lymphs Abs: 1 10*3/uL (ref 0.7–4.0)
MCH: 26.9 pg (ref 26.0–34.0)
MCHC: 32.2 g/dL (ref 30.0–36.0)
MCV: 83.5 fL (ref 80.0–100.0)
Monocytes Absolute: 0.4 10*3/uL (ref 0.1–1.0)
Monocytes Relative: 13 %
Neutro Abs: 1.8 10*3/uL (ref 1.7–7.7)
Neutrophils Relative %: 54 %
Platelets: 211 10*3/uL (ref 150–400)
RBC: 3.76 MIL/uL — ABNORMAL LOW (ref 3.87–5.11)
RDW: 15.5 % (ref 11.5–15.5)
WBC: 3.3 10*3/uL — ABNORMAL LOW (ref 4.0–10.5)
nRBC: 0 % (ref 0.0–0.2)

## 2022-12-30 LAB — MAGNESIUM: Magnesium: 1.9 mg/dL (ref 1.7–2.4)

## 2022-12-30 LAB — PHOSPHORUS: Phosphorus: 3.1 mg/dL (ref 2.5–4.6)

## 2022-12-30 MED ORDER — MAGNESIUM SULFATE 2 GM/50ML IV SOLN
2.0000 g | Freq: Once | INTRAVENOUS | Status: AC
  Administered 2022-12-30: 2 g via INTRAVENOUS
  Filled 2022-12-30: qty 50

## 2022-12-30 MED ORDER — POTASSIUM CHLORIDE CRYS ER 20 MEQ PO TBCR
40.0000 meq | EXTENDED_RELEASE_TABLET | ORAL | Status: AC
  Administered 2022-12-30 (×2): 40 meq via ORAL
  Filled 2022-12-30 (×2): qty 2

## 2022-12-30 NOTE — Progress Notes (Signed)
Echocardiogram 2D Echocardiogram has been performed.  Toni Amend 12/30/2022, 9:17 AM

## 2022-12-30 NOTE — Hospital Course (Signed)
Cynthia Velasquez was admitted to the hospital with the working diagnosis of syncope.   72 yo female with the past medical history of hypertension, T cell Lymphoma  anal cancer, and breast cancer who presented with syncope episode. Patient loss her consciousness while seating on a brick wall, no apparent prodromal symptoms, and took about 10 minutes to recover her consciousness. On her initial physical examination her blood pressure was 144/81. HR 70, RR 18, 02 saturation 99%, lungs with no wheezing or rales, heart with S1 and S2 present and rhythmic, positive systolic murmur, abdomen with no distention and no lower extremity edema.   Na 139, K 2,8 CL 118, bicarbonate 17, glucose 94, bun 7 cr 0,66 Mag 1,5  Wbc 3,3 hgb 10,1 plt 211   CT head with no acute changes.  CTA negative for large vessel occlusion or other emergent finding. No significant carotid narrowing or stenosis related to the patient's cervical adenopathy within the neck.  Multiple abnormal enlarged cervical lymph nodes, right greater than left, consistent with lymphoma.   EKG 70 bpm, normal axis, normal intervals, sinus rhythm with no significant ST segment or T wave changes.   EEG with no seizures.

## 2022-12-30 NOTE — Discharge Summary (Signed)
Physician Discharge Summary   [name] MRN: [id number] DOB: Mar 23, 1951  Admit date:     12/28/2022  Discharge date: 12/30/22  Discharge Physician: York Ram Adyen Bifulco   PCP: [name], MD   Recommendations at discharge:    Follow up with Dr Hyacinth Meeker in 7 to 10 days Follow up renal function and electrolytes in 7 days as outpatient. Follow up with Oncology this week as scheduled.   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 [facility] Course: Cynthia Velasquez was admitted to the hospital with the working diagnosis of syncope.   72 yo female with the past medical history of hypertension, T cell Lymphoma  anal cancer, and breast cancer who presented with syncope episode. Patient loss her consciousness while seating on a brick wall, no apparent prodromal symptoms, and took about 10 minutes to recover her consciousness. On her initial physical examination her blood pressure was 144/81. HR 70, RR 18, 02 saturation 99%, lungs with no wheezing or rales, heart with S1 and S2 present and rhythmic, positive systolic murmur, abdomen with no distention and no lower extremity edema.   Na 139, K 2,8 CL 118, bicarbonate 17, glucose 94, bun 7 cr 0,66 Mag 1,5  Wbc 3,3 hgb 10,1 plt 211   CT head with no acute changes.  CTA negative for large vessel occlusion or other emergent finding. No significant carotid narrowing or stenosis related to the patient's cervical adenopathy within the neck.  Multiple abnormal enlarged cervical lymph nodes, right greater than left, consistent with lymphoma.   EKG 70 bpm, normal axis, normal intervals, sinus rhythm with no significant ST segment or T wave changes.   EEG with no seizures.    Assessment and Plan: * Syncope and  collapse Suspected vagal syncope in the setting of hypokalemia and hypomagnesemia.  EKG with no intervals prolongation or features of Brugada syndrome. Echocardiogram with preserved LV systolic function with EF 60 to 65%, mild LVH, RV not well visualized, RVSP 22.4 mmHg.  No evidence of pericardial effusion. No significant valvular disease.  Systolic blood pressure 140 to 170 mmHg.  Plan to discharge home, follow up with Dr Candise Che from oncology as scheduled this week. Possible vagal stimulation from cervical lymphadenopathy. Advised not to drive until syncope free for at least 6 months.    Hypokalemia Hypomagnesemia.   Renal function has remained stable, electrolytes were corrected.  At the time of her discharge her serum cr is 0,81 with K at 3,3 and serum bicarbonate at 22. Na 136 and Mg 1,9.  Patient will get 80 meq Kcl and 2 g mag sulfate prior her discharge.  Follow up renal function and electrolytes as outpatient.   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   Anemia of chronic disease/ leukopenia . Her cell count has been stable with hgb at 10.1 and wbc at 3,3  Follow up as outpatient.   Essential hypertension Continue blood pressure control with losartan.   GERD without esophagitis Continue with pantoprazole.          Consultants: none  Procedures performed: none   Disposition: Home Diet recommendation:  Cardiac diet DISCHARGE MEDICATION: Allergies as of 12/30/2022       Reactions  Tramadol Itching, Rash   Codeine Itching, Rash   Vicodin [hydrocodone-acetaminophen] Itching, Rash        Medication List     STOP taking these medications    acyclovir 400 MG tablet Commonly known as: ZOVIRAX   saccharomyces boulardii 250 MG capsule Commonly known as: FLORASTOR       TAKE these medications    aspirin-acetaminophen-caffeine 250-250-65 MG tablet Commonly known as: EXCEDRIN  MIGRAINE Take 2 tablets by mouth every 6 (six) hours as needed for migraine.   diclofenac 75 MG EC tablet Commonly known as: VOLTAREN Take 75 mg by mouth 2 (two) times daily as needed for mild pain.   diphenoxylate-atropine 2.5-0.025 MG tablet Commonly known as: LOMOTIL Take 2 tablets by mouth 4 (four) times daily as needed for diarrhea or loose stools.   DULoxetine 60 MG capsule Commonly known as: Cymbalta Take 1 capsule (60 mg total) by mouth 2 (two) times daily. Take 1 capsule (30 mg) at bedtime for 1 week, if no side effects, then increase to 2 capsules (60 mg) thereafter.   Gerhardt's butt cream Crea Apply 1 Application topically 2 (two) times daily. What changed:  when to take this reasons to take this   lidocaine 4 % cream Commonly known as: LMX Apply topically 2 (two) times daily as needed (pain).   losartan 100 MG tablet Commonly known as: COZAAR Take 100 mg by mouth daily.   methocarbamol 750 MG tablet Commonly known as: ROBAXIN Take 750 mg by mouth every 6 (six) hours as needed for muscle spasms.   nystatin powder Commonly known as: MYCOSTATIN/NYSTOP Apply 1 Application topically 3 (three) times daily. What changed:  when to take this reasons to take this   pantoprazole 40 MG tablet Commonly known as: PROTONIX Take 1 tablet (40 mg total) by mouth daily. What changed:  when to take this reasons to take this   prochlorperazine 10 MG tablet Commonly known as: COMPAZINE Take 1 tablet (10 mg total) by mouth every 6 (six) hours as needed for nausea or vomiting.   SALONPAS PAIN RELIEF PATCH EX Apply 1 patch topically daily as needed (pain).        Discharge Exam: Filed Weights   12/28/22 1541  Weight: 91 kg   BP 135/76 (BP Location: Left Arm)   Pulse 69   Temp 98.3 F (36.8 C) (Oral)   Resp 17   Ht 5\' 5"  (1.651 m)   Wt 91 kg   SpO2 94%   BMI 33.38 kg/m   Patient with no chest pain or dyspnea, no further syncope episodes.   Neurology  awake and alert ENT with mild pallor Cardiovascular with S1 and S2 present and rhythmic with no gallops, rubs or murmurs Respiratory with no rales or wheezing Abdomen with no distention   Condition at discharge: stable  The results of significant diagnostics from this hospitalization (including imaging, microbiology, ancillary and laboratory) are listed below for reference.   Imaging Studies: ECHOCARDIOGRAM LIMITED  Result Date: 12/30/2022    ECHOCARDIOGRAM LIMITED REPORT   Patient Name:   Cynthia Velasquez Date of Exam: 12/30/2022 Medical Rec #:  [id number]     Height:       65.0 in Accession #:    [id number]    Weight:       200.6 lb Date of Birth:  [birth year]    BSA:          1.981 m Patient Age:    22 years  BP:           173/92 mmHg Patient Gender: F             HR:           64 bpm. Exam Location:  Inpatient Procedure: Limited Echo, Cardiac Doppler, Color Doppler and Strain Analysis Indications:    syncope  History:        Patient has prior history of Echocardiogram examinations. Risk                 Factors:Hypertension and Dyslipidemia.  Sonographer:    Mike Gip Referring Phys: [phone] ERIC CHEN IMPRESSIONS  1. Left ventricular ejection fraction, by estimation, is 60 to 65%. The left ventricle has normal function. The left ventricle has no regional wall motion abnormalities. There is mild concentric left ventricular hypertrophy. Left ventricular diastolic function could not be evaluated. The average left ventricular global longitudinal strain is -19.2 %. The global longitudinal strain is normal.  2. Right ventricular systolic function was not well visualized. The right ventricular size is not well visualized. There is normal pulmonary artery systolic pressure.  3. The mitral valve is normal in structure. Trivial mitral valve regurgitation. No evidence of mitral stenosis.  4. The aortic valve is normal in structure. Aortic valve regurgitation is not visualized. No aortic stenosis is present.  5.  The inferior vena cava is normal in size with greater than 50% respiratory variability, suggesting right atrial pressure of 3 mmHg. FINDINGS  Left Ventricle: Left ventricular ejection fraction, by estimation, is 60 to 65%. The left ventricle has normal function. The left ventricle has no regional wall motion abnormalities. The average left ventricular global longitudinal strain is -19.2 %. The global longitudinal strain is normal. The left ventricular internal cavity size was normal in size. There is mild concentric left ventricular hypertrophy. Left ventricular diastolic function could not be evaluated. Right Ventricle: The right ventricular size is not well visualized. Right ventricular systolic function was not well visualized. There is normal pulmonary artery systolic pressure. The tricuspid regurgitant velocity is 2.20 m/s, and with an assumed right  atrial pressure of 3 mmHg, the estimated right ventricular systolic pressure is 22.4 mmHg. Left Atrium: Left atrial size was not well visualized. Right Atrium: Right atrial size was not well visualized. Pericardium: There is no evidence of pericardial effusion. Mitral Valve: The mitral valve is normal in structure. Trivial mitral valve regurgitation. No evidence of mitral valve stenosis. Tricuspid Valve: The tricuspid valve is normal in structure. Tricuspid valve regurgitation is trivial. No evidence of tricuspid stenosis. Aortic Valve: The aortic valve is normal in structure. Aortic valve regurgitation is not visualized. No aortic stenosis is present. Pulmonic Valve: The pulmonic valve was normal in structure. Pulmonic valve regurgitation is not visualized. No evidence of pulmonic stenosis. Aorta: The aortic root is normal in size and structure. Venous: The inferior vena cava is normal in size with greater than 50% respiratory variability, suggesting right atrial pressure of 3 mmHg. IAS/Shunts: No atrial level shunt detected by color flow Doppler. LEFT VENTRICLE  PLAX 2D LVIDd:         3.20 cm LVIDs:         1.90 cm     2D Longitudinal Strain LV PW:         1.10 cm     2D Strain GLS Avg:     -19.2 % LV IVS:        1.10 cm  LV Volumes (MOD) LV vol d,  MOD A2C: 90.4 ml LV vol d, MOD A4C: 79.5 ml LV vol s, MOD A2C: 29.2 ml LV vol s, MOD A4C: 24.9 ml LV SV MOD A2C:     61.2 ml LV SV MOD A4C:     79.5 ml LV SV MOD BP:      59.0 ml IVC IVC diam: 2.00 cm TRICUSPID VALVE TR Peak grad:   19.4 mmHg TR Vmax:        220.00 cm/s Lavona Mound Tobb DO Electronically signed by Thomasene Ripple DO Signature Date/Time: 12/30/2022/10:04:17 AM    Final    EEG adult  Result Date: 12/29/2022 Charlsie Quest, MD     12/29/2022  3:39 PM Patient Name: Cynthia Velasquez MRN: [id number] Epilepsy Attending: Charlsie Quest Referring Physician/Provider: Albertine Grates, MD Date: 12/29/2022 Duration: 26.09 mins Patient history: 72yo F with syncope. EEG to evaluate for seizure. Level of alertness: Awake AEDs during EEG study: None Technical aspects: This EEG study was done with scalp electrodes positioned according to the 10-20 International system of electrode placement. Electrical activity was reviewed with band pass filter of 1-70Hz , sensitivity of 7 uV/mm, display speed of 17mm/sec with a  notched filter applied as appropriate. EEG data were recorded continuously and digitally stored.  Video monitoring was available and reviewed as appropriate. Description: The posterior dominant rhythm consists of 9-10 Hz activity of moderate voltage (25-35 uV) seen predominantly in posterior head regions, symmetric and reactive to eye opening and eye closing. Hyperventilation did not show any EEG change.  Physiologic photic driving was not seen during photic stimulation.  IMPRESSION: This study is within normal limits. No seizures or epileptiform discharges were seen throughout the recording. A normal interictal EEG does not exclude the diagnosis of epilepsy. Charlsie Quest   CT ANGIO HEAD NECK W WO CM  Result Date:  12/29/2022 CLINICAL DATA:  Initial evaluation for lymphadenopathy, evaluate carotid blood flow. EXAM: CT ANGIOGRAPHY HEAD AND NECK WITH AND WITHOUT CONTRAST TECHNIQUE: Multidetector CT imaging of the head and neck was performed using the standard protocol during bolus administration of intravenous contrast. Multiplanar CT image reconstructions and MIPs were obtained to evaluate the vascular anatomy. Carotid stenosis measurements (when applicable) are obtained utilizing NASCET criteria, using the distal internal carotid diameter as the denominator. RADIATION DOSE REDUCTION: This exam was performed according to the departmental dose-optimization program which includes automated exposure control, adjustment of the mA and/or kV according to patient size and/or use of iterative reconstruction technique. CONTRAST:  50mL OMNIPAQUE IOHEXOL 350 MG/ML SOLN COMPARISON:  Comparison made with prior exams from 12/28/2022 and 12/13/2022. FINDINGS: Examination is somewhat technically limited as thick MIP reconstructions are not available for review at time of this dictation. Additionally, a 2 mm axial sequence through the neck is also unavailable for review. CTA NECK FINDINGS Aortic arch: Visualized aortic arch normal in caliber with standard 3 vessel morphology. Minor atheromatous change about the arch itself. No significant stenosis about the origin the great vessels. Right carotid system: Right common and internal carotid arteries are tortuous without dissection or hemodynamically significant stenosis. Left carotid system: Left common and internal carotid arteries are tortuous without evidence for dissection. Mild eccentric plaque about the left carotid bulb without hemodynamically significant stenosis. Vertebral arteries: Both vertebral arteries arise from subclavian arteries. No proximal subclavian artery stenosis. Both vertebral arteries are tortuous but patent without stenosis or dissection. Skeleton: No discrete or  worrisome osseous lesions. Moderate degenerative spondylosis present at C3-4 through C6-7. Osteoarthritic changes noted about the TMJs  bilaterally. Other neck: Multiple abnormal enlarged predominantly right cervical lymph nodes are seen, consistent with history of lymphoma. Largest of these is seen at level one B and measures 2.2 cm in short axis (series 7, image 197). An additional prominent right level 3 node measuring 2.1 cm in short access noted as well (series 7, image 224). Subtle central necrosis noted within this node. Associated swelling with hazy fat stranding within the adjacent right neck. On the left, a prominent left level 3 node measures 1.3 cm (series 7, image 226). Incidental 5 mm right thyroid nodule noted, of doubtful significance given size and patient age, no follow-up imaging recommended (ref: J Am Coll Radiol. 2015 Feb;12(2): 143-50). Upper chest: Hazy subsegmental atelectatic changes noted dependently within the visualized lung bases. Right-sided central venous catheter partially visualized. Visualized upper chest demonstrates no other acute finding. Review of the MIP images confirms the above findings CTA HEAD FINDINGS Anterior circulation: Both internal carotid arteries are patent to the termini without stenosis or other abnormality. A1 segments, anterior communicating artery complex common anterior cerebral arteries are patent without stenosis. No one stenosis or occlusion. No proximal MCA branch occlusion or high-grade stenosis. Distal MCA branches are perfused and symmetric. Posterior circulation: Both V4 segments are patent without stenosis. Right PICA patent at its origin. Left PICA not well seen. Basilar patent without stenosis. Superior cerebral arteries patent bilaterally. Both PCAs primarily supplied via the basilar well perfused or distal aspects. Venous sinuses: Patent allowing for timing the contrast bolus. Anatomic variants: None significant.  No aneurysm. Review of the MIP  images confirms the above findings IMPRESSION: 1. Negative CTA for large vessel occlusion or other emergent finding. No significant carotid narrowing or stenosis related to the patient's cervical adenopathy within the neck. 2. Multiple abnormal enlarged cervical lymph nodes, right greater than left, consistent with history of lymphoma. Aortic Atherosclerosis (ICD10-I70.0). Electronically Signed   By: Rise Mu M.D.   On: 12/29/2022 03:31   CT Angio Chest PE W and/or Wo Contrast  Result Date: 12/28/2022 CLINICAL DATA:  Syncopal episode. Undergoing chemotherapy for non-Hodgkin's lymphoma. High risk for pulmonary embolism. EXAM: CT ANGIOGRAPHY CHEST WITH CONTRAST TECHNIQUE: Multidetector CT imaging of the chest was performed using the standard protocol during bolus administration of intravenous contrast. Multiplanar CT image reconstructions and MIPs were obtained to evaluate the vascular anatomy. RADIATION DOSE REDUCTION: This exam was performed according to the departmental dose-optimization program which includes automated exposure control, adjustment of the mA and/or kV according to patient size and/or use of iterative reconstruction technique. CONTRAST:  75mL OMNIPAQUE IOHEXOL 350 MG/ML SOLN COMPARISON:  PET-CT on 12/13/2022 FINDINGS: Cardiovascular: Satisfactory opacification of pulmonary arteries noted, and no pulmonary emboli identified. No evidence of thoracic aortic dissection or aneurysm. Aortic atherosclerotic calcification incidentally noted. Mediastinum/Nodes: No masses or pathologically enlarged lymph nodes identified within the thorax. Bilateral jugular and submandibular lymphadenopathy is seen in the visualized portion of the neck which appears increased since previous study. Lungs/Pleura: No pulmonary mass, infiltrate, or effusion. Mild atelectasis seen in the left lung base. Upper abdomen: No acute findings. Musculoskeletal: No suspicious bone lesions identified. Right shoulder  prosthesis again seen causing streak artifact through the upper thorax. Review of the MIP images confirms the above findings. IMPRESSION: No evidence of pulmonary embolism. Mild left basilar atelectasis. Bilateral jugular and submandibular lymphadenopathy in the visualized portion of the neck, which appears increased since previous PET-CT. Consider neck CT contrast for further evaluation. Electronically Signed   By: Dietrich Pates.D.  On: 12/28/2022 17:58   CT HEAD WO CONTRAST ( )  Result Date: 12/28/2022 CLINICAL DATA:  Neuro deficit, acute, stroke suspected Syncope/presyncope, cerebrovascular cause suspected EXAM: CT HEAD WITHOUT CONTRAST TECHNIQUE: Contiguous axial images were obtained from the base of the skull through the vertex without intravenous contrast. RADIATION DOSE REDUCTION: This exam was performed according to the departmental dose-optimization program which includes automated exposure control, adjustment of the mA and/or kV according to patient size and/or use of iterative reconstruction technique. COMPARISON:  MRI head 10/27/2021, CT head 04/04/2021 FINDINGS: Brain: No evidence of large-territorial acute infarction. No parenchymal hemorrhage. No mass lesion. No extra-axial collection. No mass effect or midline shift. No hydrocephalus. Basilar cisterns are patent. Vascular: No hyperdense vessel. Skull: No acute fracture or focal lesion. Sinuses/Orbits: Paranasal sinuses and mastoid air cells are clear. The orbits are unremarkable. Other: None. IMPRESSION: No acute intracranial abnormality. Electronically Signed   By: Tish Frederickson M.D.   On: 12/28/2022 17:42   DG Chest Port 1 View  Result Date: 12/28/2022 CLINICAL DATA:  Syncope EXAM: PORTABLE CHEST 1 VIEW COMPARISON:  Chest x-ray October 30, 2022 FINDINGS: Right chest wall port catheter in place, terminating near the superior cavoatrial junction. The cardiomediastinal silhouette is unchanged in contour. No focal pulmonary opacity. No  pleural effusion or pneumothorax. The visualized upper abdomen is unremarkable. Surgical clips projecting over bilateral axillae. Right shoulder arthroplasty in place. No acute osseous abnormality. IMPRESSION: No acute cardiopulmonary abnormality. Electronically Signed   By: Jacob Moores M.D.   On: 12/28/2022 17:04   NM PET Image Restag (PS) Skull Base To Thigh  Result Date: 12/15/2022 CLINICAL DATA:  Subsequent treatment strategy for T-cell non-Hodgkin's lymphoma. EXAM: NUCLEAR MEDICINE PET SKULL BASE TO THIGH TECHNIQUE: 9.96 mCi F-18 FDG was injected intravenously. Full-ring PET imaging was performed from the skull base to thigh after the radiotracer. CT data was obtained and used for attenuation correction and anatomic localization. Fasting blood glucose: 111 mg/dl COMPARISON:  PET-CT 40/98/1191. FINDINGS: Mediastinal blood pool activity: SUV max 2.3 Liver activity: SUV max 3.3 NECK: Enlarged right-sided level 4 lymph node (axial image 28 of series 4) measuring 1.4 cm in short axis (SUVmax = 10.2). Enlarged level 5A lymph node (axial image 28 of series 4) measuring 11 mm in short axis (SUVmax = 8.2). Borderline enlarged left level 5A lymph node (axial image 28 of series 4) measuring 8 mm in short axis (SUVmax = 10.0) . Enlarged right submandibular (level 1B) lymph node (axial image 29 of series 4) measuring 1 cm in short axis (SUVmax = 11.0). Incidental CT findings: None. CHEST: No hypermetabolic mediastinal or hilar nodes. No suspicious pulmonary nodules on the CT scan. Incidental CT findings: Right internal jugular single-lumen Port-A-Cath with tip terminating in the right atrium. Aortic atherosclerosis. ABDOMEN/PELVIS: No abnormal hypermetabolic activity within the liver, pancreas, adrenal glands, or spleen. No hypermetabolic lymph nodes in the abdomen or pelvis. Incidental CT findings: Status post cholecystectomy. Aortic atherosclerosis. SKELETON: No focal hypermetabolic activity to suggest skeletal  metastasis. Incidental CT findings: Status post right shoulder arthroplasty and bilateral hip arthroplasty. IMPRESSION: 1. Recurrent hypermetabolic cervical lymphadenopathy bilaterally, as detailed above indicative of recurrent disease ([place] 5). 2. No additional sites of disease recurrence in the chest, abdomen or pelvis. 3. Aortic atherosclerosis. Electronically Signed   By: Trudie Reed M.D.   On: 12/15/2022 09:30    Microbiology: Results for orders placed or performed in visit on 10/24/22  GI pathogen panel by PCR, stool     Status: Abnormal  Collection Time: 10/24/22  3:35 PM   Specimen: Stool  Result Value Ref Range Status   Plesiomonas shigelloides NOT DETECTED NOT DETECTED Final   Yersinia enterocolitica NOT DETECTED NOT DETECTED Final   Vibrio NOT DETECTED NOT DETECTED Final   Enteropathogenic E coli NOT DETECTED NOT DETECTED Final   E coli (ETEC) LT/ST NOT DETECTED NOT DETECTED Final   E coli 0157 by PCR Not applicable NOT DETECTED Final   Cryptosporidium by PCR NOT DETECTED NOT DETECTED Final   Entamoeba histolytica NOT DETECTED NOT DETECTED Final   Adenovirus F 40/41 NOT DETECTED NOT DETECTED Final   Norovirus GI/GII NOT DETECTED NOT DETECTED Final   Sapovirus DETECTED (A) NOT DETECTED Final    Comment: (NOTE) Performed At: [facility] [address] [place], [place] [id number] Jolene Schimke MD VH:[phone]    Vibrio cholerae NOT DETECTED NOT DETECTED Final   Campylobacter by PCR NOT DETECTED NOT DETECTED Final   Salmonella by PCR NOT DETECTED NOT DETECTED Final   E coli (STEC) NOT DETECTED NOT DETECTED Final   Enteroaggregative E coli NOT DETECTED NOT DETECTED Final   Shigella by PCR NOT DETECTED NOT DETECTED Final   Cyclospora cayetanensis NOT DETECTED NOT DETECTED Final   Astrovirus NOT DETECTED NOT DETECTED Final   G lamblia by PCR NOT DETECTED NOT DETECTED Final   Rotavirus A by PCR NOT DETECTED NOT DETECTED Final    Labs: CBC: Recent Labs   Lab 12/28/22 1555 12/30/22 0404  WBC 3.9* 3.3*  NEUTROABS 2.7 1.8  HGB 10.4* 10.1*  HCT 34.4* 31.4*  MCV 88.0 83.5  PLT 232 211   Basic Metabolic Panel: Recent Labs  Lab 12/28/22 1555 12/28/22 1605 12/29/22 0500 12/30/22 0404  NA 141  --  139 136  K 2.9*  --  2.8* 3.3*  CL 105  --  118* 104  CO2 25  --  17* 22  GLUCOSE 102*  --  94 102*  BUN 10  --  7* 7*  CREATININE 0.93  --  0.66 0.81  CALCIUM 8.8*  --  6.5* 8.7*  MG  --  1.5* 1.5* 1.9  PHOS  --   --   --  3.1   Liver Function Tests: Recent Labs  Lab 12/28/22 1555  AST 21  ALT 10  ALKPHOS 80  BILITOT 0.7  PROT 6.4*  ALBUMIN 3.1*   CBG: Recent Labs  Lab 12/28/22 1545  GLUCAP 94    Discharge time spent: greater than 30 minutes.  Signed: Coralie Keens, MD [facility] 12/30/2022

## 2022-12-31 ENCOUNTER — Other Ambulatory Visit: Payer: Self-pay

## 2023-01-02 ENCOUNTER — Other Ambulatory Visit: Payer: Self-pay

## 2023-01-02 DIAGNOSIS — K627 Radiation proctitis: Secondary | ICD-10-CM | POA: Diagnosis not present

## 2023-01-02 DIAGNOSIS — K625 Hemorrhage of anus and rectum: Secondary | ICD-10-CM | POA: Diagnosis not present

## 2023-01-02 DIAGNOSIS — C20 Malignant neoplasm of rectum: Secondary | ICD-10-CM | POA: Diagnosis not present

## 2023-01-02 DIAGNOSIS — R590 Localized enlarged lymph nodes: Secondary | ICD-10-CM

## 2023-01-02 DIAGNOSIS — R150 Incomplete defecation: Secondary | ICD-10-CM | POA: Diagnosis not present

## 2023-01-02 DIAGNOSIS — R194 Change in bowel habit: Secondary | ICD-10-CM | POA: Diagnosis not present

## 2023-01-03 ENCOUNTER — Inpatient Hospital Stay: Payer: PPO

## 2023-01-03 ENCOUNTER — Inpatient Hospital Stay: Payer: PPO | Attending: Hematology | Admitting: Hematology

## 2023-01-03 VITALS — BP 168/103 | HR 71 | Temp 97.0°F | Resp 15 | Wt 200.7 lb

## 2023-01-03 DIAGNOSIS — Z9013 Acquired absence of bilateral breasts and nipples: Secondary | ICD-10-CM | POA: Diagnosis not present

## 2023-01-03 DIAGNOSIS — R590 Localized enlarged lymph nodes: Secondary | ICD-10-CM

## 2023-01-03 DIAGNOSIS — C8448 Peripheral T-cell lymphoma, not classified, lymph nodes of multiple sites: Secondary | ICD-10-CM | POA: Diagnosis not present

## 2023-01-03 DIAGNOSIS — R197 Diarrhea, unspecified: Secondary | ICD-10-CM | POA: Insufficient documentation

## 2023-01-03 DIAGNOSIS — Z9221 Personal history of antineoplastic chemotherapy: Secondary | ICD-10-CM | POA: Diagnosis not present

## 2023-01-03 DIAGNOSIS — M6281 Muscle weakness (generalized): Secondary | ICD-10-CM | POA: Diagnosis not present

## 2023-01-03 DIAGNOSIS — Z923 Personal history of irradiation: Secondary | ICD-10-CM | POA: Insufficient documentation

## 2023-01-03 DIAGNOSIS — Z853 Personal history of malignant neoplasm of breast: Secondary | ICD-10-CM | POA: Diagnosis not present

## 2023-01-03 DIAGNOSIS — C50411 Malignant neoplasm of upper-outer quadrant of right female breast: Secondary | ICD-10-CM

## 2023-01-03 DIAGNOSIS — C210 Malignant neoplasm of anus, unspecified: Secondary | ICD-10-CM | POA: Diagnosis not present

## 2023-01-03 DIAGNOSIS — R2689 Other abnormalities of gait and mobility: Secondary | ICD-10-CM | POA: Diagnosis not present

## 2023-01-03 DIAGNOSIS — C50412 Malignant neoplasm of upper-outer quadrant of left female breast: Secondary | ICD-10-CM

## 2023-01-03 DIAGNOSIS — Z95828 Presence of other vascular implants and grafts: Secondary | ICD-10-CM

## 2023-01-03 LAB — CBC WITH DIFFERENTIAL (CANCER CENTER ONLY)
Abs Immature Granulocytes: 0.01 10*3/uL (ref 0.00–0.07)
Basophils Absolute: 0 10*3/uL (ref 0.0–0.1)
Basophils Relative: 0 %
Eosinophils Absolute: 0.1 10*3/uL (ref 0.0–0.5)
Eosinophils Relative: 2 %
HCT: 33.8 % — ABNORMAL LOW (ref 36.0–46.0)
Hemoglobin: 10.7 g/dL — ABNORMAL LOW (ref 12.0–15.0)
Immature Granulocytes: 0 %
Lymphocytes Relative: 33 %
Lymphs Abs: 1.1 10*3/uL (ref 0.7–4.0)
MCH: 26.9 pg (ref 26.0–34.0)
MCHC: 31.7 g/dL (ref 30.0–36.0)
MCV: 84.9 fL (ref 80.0–100.0)
Monocytes Absolute: 0.5 10*3/uL (ref 0.1–1.0)
Monocytes Relative: 13 %
Neutro Abs: 1.8 10*3/uL (ref 1.7–7.7)
Neutrophils Relative %: 52 %
Platelet Count: 279 10*3/uL (ref 150–400)
RBC: 3.98 MIL/uL (ref 3.87–5.11)
RDW: 15.8 % — ABNORMAL HIGH (ref 11.5–15.5)
WBC Count: 3.5 10*3/uL — ABNORMAL LOW (ref 4.0–10.5)
nRBC: 0 % (ref 0.0–0.2)

## 2023-01-03 LAB — CMP (CANCER CENTER ONLY)
ALT: 13 U/L (ref 0–44)
AST: 18 U/L (ref 15–41)
Albumin: 3.8 g/dL (ref 3.5–5.0)
Alkaline Phosphatase: 92 U/L (ref 38–126)
Anion gap: 6 (ref 5–15)
BUN: 19 mg/dL (ref 8–23)
CO2: 26 mmol/L (ref 22–32)
Calcium: 9.5 mg/dL (ref 8.9–10.3)
Chloride: 110 mmol/L (ref 98–111)
Creatinine: 1.01 mg/dL — ABNORMAL HIGH (ref 0.44–1.00)
GFR, Estimated: 60 mL/min — ABNORMAL LOW (ref >60)
Glucose, Bld: 95 mg/dL (ref 70–99)
Potassium: 4 mmol/L (ref 3.5–5.1)
Sodium: 142 mmol/L (ref 135–145)
Total Bilirubin: 0.4 mg/dL (ref 0.3–1.2)
Total Protein: 7.2 g/dL (ref 6.5–8.1)

## 2023-01-03 LAB — LACTATE DEHYDROGENASE: LDH: 218 U/L — ABNORMAL HIGH (ref 98–192)

## 2023-01-03 MED ORDER — HEPARIN SOD (PORK) LOCK FLUSH 100 UNIT/ML IV SOLN
500.0000 [IU] | Freq: Once | INTRAVENOUS | Status: AC
  Administered 2023-01-03: 500 [IU]

## 2023-01-03 MED ORDER — SODIUM CHLORIDE 0.9% FLUSH
10.0000 mL | Freq: Once | INTRAVENOUS | Status: AC
  Administered 2023-01-03: 10 mL

## 2023-01-03 NOTE — Progress Notes (Signed)
[facility]   [phone] [phone]    HEMATOLOGY ONCOLOGY CLINIC VISIT   Patient Care Team: [name], MD as PCP - General (Family Medicine) [name], MD as PCP - Hematology/Oncology (Hematology) [name], MD as Consulting Physician (Gastroenterology) [name], MD as Consulting Physician (Hematology) [name], NP as Nurse Practitioner (Nurse Practitioner) [name], MD as Consulting Physician (Radiation Oncology) [name], MD as Consulting Physician (General Surgery) [name], MD as Attending Physician (Otolaryngology)  Date of Service: 01/03/23    CHIEF COMPLAINT: f/u for evaluation and management of T cell lymphoma.  SUMMARY OF ONCOLOGIC HISTORY: Oncology History Overview Note  Cancer Staging Anal cancer (HCC) Staging form: Anus, AJCC 8th Edition - Clinical stage from 06/28/2019: Stage IIA (cT2, cN0, cM0) - Signed by [name], NP on 06/28/2019  Breast cancer of upper-outer quadrant of left female breast [facility]) Staging form: Breast, AJCC 7th Edition - Clinical stage from 09/07/2013: Stage IIA (T2, N0, cM0) - Signed by [name], MD on 09/07/2013 - Pathologic: Stage IIA (T2, N0, cM0) - Signed by [name], MD on 09/07/2013  Breast cancer of upper-outer quadrant of right female breast [facility]) Staging form: Breast, AJCC 7th Edition - Clinical stage from 09/07/2013: Stage IA (T1a, N0, cM0) - Signed by [name], MD on 09/07/2013 - Pathologic: Stage IA (T1a, N0, cM0) - Signed by [name], MD on 09/07/2013    Breast cancer of upper-outer quadrant of left female breast  07/10/2007 Procedure   US biopsy showed invasive ductal carcinoma 0.8cm, Nottingham grade 3   07/30/2007 Surgery   She had left breast lumpectomy and LN biopsy which showed high grade invasive ductal cancer Nottingham grade 3, 2.7 cm, ER/PR/Her2 neu negative   11/24/2007 Surgery   Patient elected for bilateral  mastectomy   09/07/2008 - 03/08/2009 Chemotherapy   dates are approximate: she received adriamycin and cytoxan followed by Taxol   03/08/2009 - 05/08/2009 Radiation Therapy   dates are approximate, she received XRT    07/09/2021 Imaging   CT CAP  IMPRESSION: 1. No evidence to suggest locally recurrent ianorectal neoplasm or metastatic disease in the chest, abdomen or pelvis. 2. Status post bilateral modified radical mastectomy and bilateral axillary lymph node dissection. 3. Three small supraumbilical ventral hernias containing only omental fat. No associated bowel incarceration or obstruction at this time. 4. Aortic atherosclerosis. 5. Additional incidental findings, as above.   Breast cancer of upper-outer quadrant of right female breast (HCC)  07/10/2006 Procedure   stereotactic biopsy showed atypical hyperplasia   10/23/2006 Surgery   right lumpectomy showed invasive ductal ca (Nottingham grade 1)  2mm and DCIS, ER/PR positive Her 2 negative   10/09/2007 - 10/08/2012 Chemotherapy   Patient was placed on Tamoxifen   03/24/2017 Imaging   No acute findings. No evidence of recurrent carcinoma or metastatic disease   07/09/2021 Imaging   CT CAP  IMPRESSION: 1. No evidence to suggest locally recurrent ianorectal neoplasm or metastatic disease in the chest, abdomen or pelvis. 2. Status post bilateral modified radical mastectomy and bilateral axillary lymph node dissection. 3. Three small supraumbilical ventral hernias containing only omental fat. No associated bowel incarceration or obstruction at this time. 4. Aortic atherosclerosis. 5. Additional incidental findings, as above.   Anal cancer  06/04/2019 Procedure   Colonoscopy per Dr. [name]: Findings-the digital rectal exam revealed a 3 cm diameter firm rectal mass.  The mass was noncircumferential and located predominantly at the right  bowel wall at the anorectal junction. A nonobstructing mass was found at the anus  and in the rectum    06/04/2019 Initial Biopsy   Follow pathology: Large intestine, rectum biopsy: Invasive well to moderately differentiated squamous cell carcinoma.  No rectal mucosa present.  There is strong diffuse expression of P 16 immunostain.  CDX 2, p63 and mCEA immunostains are also used in the diagnostic work-up of the case.   06/04/2019 Initial Diagnosis   Anal cancer (HCC)   06/21/2019 PET scan   IMPRESSION: 1. Anorectal primary. No hypermetabolic metastatic disease within the chest, abdomen, or pelvis. Perirectal nodes, at least 1 of which is new since 02/26/2018 CT, suspicious based on size and interval development. 2. Mild limitations secondary to beam hardening artifact from bilateral hip arthroplasty. 3.  Aortic Atherosclerosis (ICD10-I70.0).   06/28/2019 Cancer Staging   Staging form: Anus, AJCC 8th Edition - Clinical stage from 06/28/2019: Stage IIA (cT2, cN0, cM0) - Signed by [name], NP on 06/28/2019   06/28/2019 - 07/26/2019 Chemotherapy   concurrent chemoRT with Mitomycin and 5FU on week 1 and week 5 on starting 06/28/19. Last dose on 07/26/19   06/28/2019 - 08/09/2019 Radiation Therapy   concurrent chemoRT with Dr Mitzi Hansen 06/28/19-08/09/19   11/01/2019 PET scan   IMPRESSION: 1. Marked interval decrease in hypermetabolism noted at the level of the anal rectal primary. No evidence for hypermetabolic metastatic disease in the chest, abdomen, or pelvis. The perirectal lymph nodes identified previously have resolved in the interval. 2.  Aortic Atherosclerois (ICD10-170.0)   08/07/2020 Imaging   CT CAP  IMPRESSION: 1. No findings for residual/recurrent anal tumor, regional lymphadenopathy or metastatic disease. 2. Status post cholecystectomy. No biliary dilatation. 3. Small anterior abdominal wall hernia containing fat. 4. Aortic atherosclerosis.   Aortic Atherosclerosis (ICD10-I70.0).     07/09/2021 Imaging   CT CAP  IMPRESSION: 1. No evidence to  suggest locally recurrent ianorectal neoplasm or metastatic disease in the chest, abdomen or pelvis. 2. Status post bilateral modified radical mastectomy and bilateral axillary lymph node dissection. 3. Three small supraumbilical ventral hernias containing only omental fat. No associated bowel incarceration or obstruction at this time. 4. Aortic atherosclerosis. 5. Additional incidental findings, as above.   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 Mosetta Putt for evaluation and management of newly diagnosed T-cell non-Hodgkin's lymphoma.  Patient has a history of bilateral breast cancer status post mastectomies.  -2007: s/p right lumpectomy for ADH in 08/2006 showed DCIS ER/PR+, grade 1. 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 She was adopted, no known family history    She subsequently was diagnosed with anal squamous cell carcinoma cT2 N0 M0 diagnosed in September 2020.Workup showed a 3 cm mass at the anorectal junction, biopsy confirmed well to moderately differentiated squamous cell carcinoma. 06/21/19 PET scan showed no metastasis.  -S/p concurrent chemoRT with Mitomycin and 5FU on 08/09/19. Now on surveillance. -surveillance CT CAP 07/09/21 was NED. -  surveillance colonoscopy done for rectal bleeding on 12/07/21 showed only radiation changes. Her rectal bleeding has resolved.  Recently patient presented to her primary care physician on 05/23/2022 with  her new right neck lump which has been progressively enlarging.  She also subsequently developed a left neck swelling.  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  She has had a PET CT scan done on 06/19/2022 which showed hypermetabolic bilateral neck lymphadenopathy.  Also noted to have a new 1 cm focus of metabolic activity in segment 4 of the liver.  This is etiology indeterminate. Lumbar scoliosis and small supraumbilical hernia.  Patient has had Port-A-Cath placement done on 06/25/2022. Echocardiogram was done on 06/17/2022 and shows normal ejection fraction of 70 to 75% with grade 1 diastolic dysfunction.  Right ventricular systolic function within normal limits.  INTERVAL HISTORY  Jozlynn Plaia is a 72 y/o female here for evaluation and management of T-cell non-Hodgkin's lymphoma. Patient was last seen by me on 11/20/2022 and complained of diarrhea, abdominal cramping, ankle swelling, chills, constipation, rectal bleeding, rectal burning/itching, and worsened neuropathy in her feet and fingers.  Today, she confirms that a biopsy has not yet been scheduled. She does report a syncope episode causing her to present to the ED on 12/28/2022. The episode occurred while at the playground with her daughter. Her daughter has told her that she began fidgeting prior to the episode.  She did eat and drink water that day. She denies any frequent diarrhea at the time of the episode.   Patient reports that her left neck lymph node continues to increase in size and she does experience soreness in the area.  Patient does endorse blood in the stools with every BM and has not taken any medication for it yet. Her constipation fluctuates, and denies any diarrhea or constipation at this time. She is weary of uncontrolled diarrhea returning in a few days.  ROS  10 Point review of Systems was done is  negative except as noted above.   MEDICAL HISTORY:  Past Medical History:  Diagnosis Date   Anal cancer dx'd 05/2019   Anxiety    Arthritis    Bilateral breast cancer 10/21/2013   Breast cancer    Cancer    Depression    Gallstones    GERD (gastroesophageal reflux disease)    doesn't take any meds for this   Headache    h/o migraines       History of bladder infections    History of hiatal hernia    History of migraine    Hyperlipidemia    takes Simvastatin daily   Hypertension    takes Hyzaar   Joint pain    Joint swelling    Lumbar stenosis    Neuropathy 09/07/2013   Pneumonia    Pre-diabetes     SURGICAL HISTORY: Past Surgical History:  Procedure Laterality Date   ABDOMINAL HYSTERECTOMY     bone spur removed from left foot     CHOLECYSTECTOMY     COLONOSCOPY     COLONOSCOPY WITH PROPOFOL Bilateral 12/07/2021   Procedure: COLONOSCOPY WITH PROPOFOL;  Surgeon: Vida Rigger, MD;  Location: [facility];  Service: Endoscopy;  Laterality: Bilateral;   double mastectomy      ESOPHAGOGASTRODUODENOSCOPY N/A 12/07/2021   Procedure: ESOPHAGOGASTRODUODENOSCOPY (EGD);  Surgeon: Vida Rigger, MD;  Location: [facility];  Service: Endoscopy;  Laterality: N/A;   HERNIA REPAIR  umbilical   IR IMAGING GUIDED PORT INSERTION  06/25/2022   right knee arthroscopy     right shoulder arthroscopy     surgery for hiatal hernia     TONSILLECTOMY     TOTAL HIP ARTHROPLASTY Left 02/12/2013   TOTAL HIP ARTHROPLASTY Left 02/12/2013   Procedure: LEFT TOTAL HIP ARTHROPLASTY;  Surgeon: Nestor Lewandowsky, MD;  Location: [facility];  Service: Orthopedics;  Laterality: Left;   TOTAL HIP ARTHROPLASTY Right 05/03/2013   Procedure: TOTAL HIP ARTHROPLASTY;  Surgeon: Nestor Lewandowsky, MD;  Location: [facility];  Service: Orthopedics;  Laterality: Right;   TOTAL KNEE ARTHROPLASTY Right 02/27/2015   Procedure: TOTAL KNEE ARTHROPLASTY;  Surgeon: Gean Birchwood, MD;  Location: [facility];  Service: Orthopedics;  Laterality: Right;    TOTAL SHOULDER ARTHROPLASTY Right 09/12/2016   Procedure: RIGHT TOTAL SHOULDER ARTHROPLASTY;  Surgeon: Jones Broom, MD;  Location: [facility] OR;  Service: Orthopedics;  Laterality: Right;  RIGHT TOTAL SHOULDER ARTHROPLASTY    I have reviewed the social history and family history with the patient and they are unchanged from previous note.  ALLERGIES:  is allergic to tramadol, codeine, and vicodin [hydrocodone-acetaminophen].  MEDICATIONS:  Current Outpatient Medications  Medication Sig Dispense Refill   aspirin-acetaminophen-caffeine (EXCEDRIN MIGRAINE) 250-250-65 MG tablet Take 2 tablets by mouth every 6 (six) hours as needed for migraine.     diclofenac (VOLTAREN) 75 MG EC tablet Take 75 mg by mouth 2 (two) times daily as needed for mild pain.     diphenoxylate-atropine (LOMOTIL) 2.5-0.025 MG tablet Take 2 tablets by mouth 4 (four) times daily as needed for diarrhea or loose stools. 30 tablet 0   DULoxetine (CYMBALTA) 60 MG capsule Take 1 capsule (60 mg total) by mouth 2 (two) times daily. Take 1 capsule (30 mg) at bedtime for 1 week, if no side effects, then increase to 2 capsules (60 mg) thereafter. 60 capsule 11   lidocaine (LMX) 4 % cream Apply topically 2 (two) times daily as needed (pain). 30 g 0   losartan (COZAAR) 100 MG tablet Take 100 mg by mouth daily.  6   Menthol-Methyl Salicylate (SALONPAS PAIN RELIEF PATCH EX) Apply 1 patch topically daily as needed (pain).     methocarbamol (ROBAXIN) 750 MG tablet Take 750 mg by mouth every 6 (six) hours as needed for muscle spasms.     Nystatin (GERHARDT'S BUTT CREAM) CREA Apply 1 Application topically 2 (two) times daily. (Patient taking differently: Apply 1 Application topically 2 (two) times daily as needed for irritation.) 30 each 0   nystatin (MYCOSTATIN/NYSTOP) powder Apply 1 Application topically 3 (three) times daily. (Patient taking differently: Apply 1 Application topically 3 (three) times daily as needed (redness, rash).) 15 g 0    pantoprazole (PROTONIX) 40 MG tablet Take 1 tablet (40 mg total) by mouth daily. (Patient taking differently: Take 40 mg by mouth daily as needed (for acid reflux).) 30 tablet 0   prochlorperazine (COMPAZINE) 10 MG tablet Take 1 tablet (10 mg total) by mouth every 6 (six) hours as needed for nausea or vomiting. 30 tablet 6   No current facility-administered medications for this visit.    PHYSICAL EXAMINATION: .There were no vitals taken for this visit.   GENERAL:alert, in no acute distress and comfortable SKIN: no acute rashes, no significant lesions EYES: conjunctiva are pink and non-injected, sclera anicteric OROPHARYNX: MMM, no exudates, no oropharyngeal erythema or ulceration NECK: supple, no JVD LYMPH:  no palpable lymphadenopathy in the cervical, axillary or inguinal regions  LUNGS: clear to auscultation b/l with normal respiratory effort HEART: regular rate & rhythm ABDOMEN:  normoactive bowel sounds , non tender, not distended. Extremity: no pedal edema PSYCH: alert & oriented x 3 with fluent speech NEURO: no focal motor/sensory deficits   LABORATORY DATA:  I have reviewed the data as listed    Latest Ref Rng & Units 12/30/2022    4:04 AM 12/28/2022    3:55 PM 11/20/2022   10:03 AM  CBC  WBC 4.0 - 10.5 K/uL 3.3  3.9  4.6   Hemoglobin 12.0 - 15.0 g/dL 45.4  09.8  11.9   Hematocrit 36.0 - 46.0 % 31.4  34.4  32.6   Platelets 150 - 400 K/uL 211  232  248   .    Latest Ref Rng & Units 12/30/2022    4:04 AM 12/29/2022    5:00 AM 12/28/2022    3:55 PM  CMP  Glucose 70 - 99 mg/dL 147  94  829   BUN 8 - 23 mg/dL Creatinine 0.44 - 1.00 mg/dL 5.62  1.30  8.65   Sodium 135 - 145 mmol/L 136  139  141   Potassium 3.5 - 5.1 mmol/L 3.3  2.8  2.9   Chloride 98 - 111 mmol/L 104  118  105   CO2 22 - 32 mmol/L Calcium 8.9 - 10.3 mg/dL 8.7  6.5  8.8   Total Protein 6.5 - 8.1 g/dL   6.4   Total Bilirubin 0.3 - 1.2 mg/dL   0.7   Alkaline Phos 38 - 126 U/L   80    AST 15 - 41 U/L   21   ALT 0 - 44 U/L   10       Legend: (L) Low (H) High  . Lab Results  Component Value Date   LDH 185 11/20/2022           RADIOGRAPHIC STUDIES: .ECHOCARDIOGRAM LIMITED  Result Date: 12/30/2022    ECHOCARDIOGRAM LIMITED REPORT   Patient Name:   SHALIYAH TAITE Date of Exam: 12/30/2022 Medical Rec #:  [id number]     Height:       65.0 in Accession #:    [id number]    Weight:       200.6 lb Date of Birth:  05/22/1951    BSA:          1.981 m Patient Age:    71 years      BP:           173/92 mmHg Patient Gender: F             HR:           64 bpm. Exam Location:  Inpatient Procedure: Limited Echo, Cardiac Doppler, Color Doppler and Strain Analysis Indications:    syncope  History:        Patient has prior history of Echocardiogram examinations. Risk                 Factors:Hypertension and Dyslipidemia.  Sonographer:    Mike Gip Referring Phys: [phone] ERIC CHEN IMPRESSIONS  1. Left ventricular ejection fraction, by estimation, is 60 to 65%. The left ventricle has normal function. The left ventricle has no regional wall motion abnormalities. There is mild concentric left ventricular hypertrophy. Left ventricular diastolic function could not be evaluated. The average left ventricular global longitudinal strain is -19.2 %. The global longitudinal  strain is normal.  2. Right ventricular systolic function was not well visualized. The right ventricular size is not well visualized. There is normal pulmonary artery systolic pressure.  3. The mitral valve is normal in structure. Trivial mitral valve regurgitation. No evidence of mitral stenosis.  4. The aortic valve is normal in structure. Aortic valve regurgitation is not visualized. No aortic stenosis is present.  5. The inferior vena cava is normal in size with greater than 50% respiratory variability, suggesting right atrial pressure of 3 mmHg. FINDINGS  Left Ventricle: Left ventricular ejection fraction, by estimation, is 60  to 65%. The left ventricle has normal function. The left ventricle has no regional wall motion abnormalities. The average left ventricular global longitudinal strain is -19.2 %. The global longitudinal strain is normal. The left ventricular internal cavity size was normal in size. There is mild concentric left ventricular hypertrophy. Left ventricular diastolic function could not be evaluated. Right Ventricle: The right ventricular size is not well visualized. Right ventricular systolic function was not well visualized. There is normal pulmonary artery systolic pressure. The tricuspid regurgitant velocity is 2.20 m/s, and with an assumed right  atrial pressure of 3 mmHg, the estimated right ventricular systolic pressure is 22.4 mmHg. Left Atrium: Left atrial size was not well visualized. Right Atrium: Right atrial size was not well visualized. Pericardium: There is no evidence of pericardial effusion. Mitral Valve: The mitral valve is normal in structure. Trivial mitral valve regurgitation. No evidence of mitral valve stenosis. Tricuspid Valve: The tricuspid valve is normal in structure. Tricuspid valve regurgitation is trivial. No evidence of tricuspid stenosis. Aortic Valve: The aortic valve is normal in structure. Aortic valve regurgitation is not visualized. No aortic stenosis is present. Pulmonic Valve: The pulmonic valve was normal in structure. Pulmonic valve regurgitation is not visualized. No evidence of pulmonic stenosis. Aorta: The aortic root is normal in size and structure. Venous: The inferior vena cava is normal in size with greater than 50% respiratory variability, suggesting right atrial pressure of 3 mmHg. IAS/Shunts: No atrial level shunt detected by color flow Doppler. LEFT VENTRICLE PLAX 2D LVIDd:         3.20 cm LVIDs:         1.90 cm     2D Longitudinal Strain LV PW:         1.10 cm     2D Strain GLS Avg:     -19.2 % LV IVS:        1.10 cm  LV Volumes (MOD) LV vol d, MOD A2C: 90.4 ml LV vol d,  MOD A4C: 79.5 ml LV vol s, MOD A2C: 29.2 ml LV vol s, MOD A4C: 24.9 ml LV SV MOD A2C:     61.2 ml LV SV MOD A4C:     79.5 ml LV SV MOD BP:      59.0 ml IVC IVC diam: 2.00 cm TRICUSPID VALVE TR Peak grad:   19.4 mmHg TR Vmax:        220.00 cm/s Lavona Mound Tobb DO Electronically signed by Thomasene Ripple DO Signature Date/Time: 12/30/2022/10:04:17 AM    Final    EEG adult  Result Date: 12/29/2022 Charlsie Quest, MD     12/29/2022  3:39 PM Patient Name: Debe Anfinson MRN: [id number] Epilepsy Attending: Charlsie Quest Referring Physician/Provider: Albertine Grates, MD Date: 12/29/2022 Duration: 26.09 mins Patient history: 72yo F with syncope. EEG to evaluate for seizure. Level of alertness: Awake AEDs during EEG study: None Technical aspects: This EEG  study was done with scalp electrodes positioned according to the 10-20 International system of electrode placement. Electrical activity was reviewed with band pass filter of 1-70Hz , sensitivity of 7 uV/mm, display speed of 92mm/sec with a 60Hz  notched filter applied as appropriate. EEG data were recorded continuously and digitally stored.  Video monitoring was available and reviewed as appropriate. Description: The posterior dominant rhythm consists of 9-10 Hz activity of moderate voltage (25-35 uV) seen predominantly in posterior head regions, symmetric and reactive to eye opening and eye closing. Hyperventilation did not show any EEG change.  Physiologic photic driving was not seen during photic stimulation.  IMPRESSION: This study is within normal limits. No seizures or epileptiform discharges were seen throughout the recording. A normal interictal EEG does not exclude the diagnosis of epilepsy. Charlsie Quest   CT ANGIO HEAD NECK W WO CM  Result Date: 12/29/2022 CLINICAL DATA:  Initial evaluation for lymphadenopathy, evaluate carotid blood flow. EXAM: CT ANGIOGRAPHY HEAD AND NECK WITH AND WITHOUT CONTRAST TECHNIQUE: Multidetector CT imaging of the head and neck was  performed using the standard protocol during bolus administration of intravenous contrast. Multiplanar CT image reconstructions and MIPs were obtained to evaluate the vascular anatomy. Carotid stenosis measurements (when applicable) are obtained utilizing NASCET criteria, using the distal internal carotid diameter as the denominator. RADIATION DOSE REDUCTION: This exam was performed according to the departmental dose-optimization program which includes automated exposure control, adjustment of the mA and/or kV according to patient size and/or use of iterative reconstruction technique. CONTRAST:  50mL OMNIPAQUE IOHEXOL 350 MG/ML SOLN COMPARISON:  Comparison made with prior exams from 12/28/2022 and 12/13/2022. FINDINGS: Examination is somewhat technically limited as thick MIP reconstructions are not available for review at time of this dictation. Additionally, a 2 mm axial sequence through the neck is also unavailable for review. CTA NECK FINDINGS Aortic arch: Visualized aortic arch normal in caliber with standard 3 vessel morphology. Minor atheromatous change about the arch itself. No significant stenosis about the origin the great vessels. Right carotid system: Right common and internal carotid arteries are tortuous without dissection or hemodynamically significant stenosis. Left carotid system: Left common and internal carotid arteries are tortuous without evidence for dissection. Mild eccentric plaque about the left carotid bulb without hemodynamically significant stenosis. Vertebral arteries: Both vertebral arteries arise from subclavian arteries. No proximal subclavian artery stenosis. Both vertebral arteries are tortuous but patent without stenosis or dissection. Skeleton: No discrete or worrisome osseous lesions. Moderate degenerative spondylosis present at C3-4 through C6-7. Osteoarthritic changes noted about the TMJs bilaterally. Other neck: Multiple abnormal enlarged predominantly right cervical lymph  nodes are seen, consistent with history of lymphoma. Largest of these is seen at level one B and measures 2.2 cm in short axis (series 7, image 197). An additional prominent right level 3 node measuring 2.1 cm in short access noted as well (series 7, image 224). Subtle central necrosis noted within this node. Associated swelling with hazy fat stranding within the adjacent right neck. On the left, a prominent left level 3 node measures 1.3 cm (series 7, image 226). Incidental 5 mm right thyroid nodule noted, of doubtful significance given size and patient age, no follow-up imaging recommended (ref: J Am Coll Radiol. 2015 Feb;12(2): 143-50). Upper chest: Hazy subsegmental atelectatic changes noted dependently within the visualized lung bases. Right-sided central venous catheter partially visualized. Visualized upper chest demonstrates no other acute finding. Review of the MIP images confirms the above findings CTA HEAD FINDINGS Anterior circulation: Both internal carotid arteries  are patent to the termini without stenosis or other abnormality. A1 segments, anterior communicating artery complex common anterior cerebral arteries are patent without stenosis. No one stenosis or occlusion. No proximal MCA branch occlusion or high-grade stenosis. Distal MCA branches are perfused and symmetric. Posterior circulation: Both V4 segments are patent without stenosis. Right PICA patent at its origin. Left PICA not well seen. Basilar patent without stenosis. Superior cerebral arteries patent bilaterally. Both PCAs primarily supplied via the basilar well perfused or distal aspects. Venous sinuses: Patent allowing for timing the contrast bolus. Anatomic variants: None significant.  No aneurysm. Review of the MIP images confirms the above findings IMPRESSION: 1. Negative CTA for large vessel occlusion or other emergent finding. No significant carotid narrowing or stenosis related to the patient's cervical adenopathy within the neck.  2. Multiple abnormal enlarged cervical lymph nodes, right greater than left, consistent with history of lymphoma. Aortic Atherosclerosis (ICD10-I70.0). Electronically Signed   By: Rise Mu M.D.   On: 12/29/2022 03:31   CT Angio Chest PE W and/or Wo Contrast  Result Date: 12/28/2022 CLINICAL DATA:  Syncopal episode. Undergoing chemotherapy for non-Hodgkin's lymphoma. High risk for pulmonary embolism. EXAM: CT ANGIOGRAPHY CHEST WITH CONTRAST TECHNIQUE: Multidetector CT imaging of the chest was performed using the standard protocol during bolus administration of intravenous contrast. Multiplanar CT image reconstructions and MIPs were obtained to evaluate the vascular anatomy. RADIATION DOSE REDUCTION: This exam was performed according to the departmental dose-optimization program which includes automated exposure control, adjustment of the mA and/or kV according to patient size and/or use of iterative reconstruction technique. CONTRAST:  75mL OMNIPAQUE IOHEXOL 350 MG/ML SOLN COMPARISON:  PET-CT on 12/13/2022 FINDINGS: Cardiovascular: Satisfactory opacification of pulmonary arteries noted, and no pulmonary emboli identified. No evidence of thoracic aortic dissection or aneurysm. Aortic atherosclerotic calcification incidentally noted. Mediastinum/Nodes: No masses or pathologically enlarged lymph nodes identified within the thorax. Bilateral jugular and submandibular lymphadenopathy is seen in the visualized portion of the neck which appears increased since previous study. Lungs/Pleura: No pulmonary mass, infiltrate, or effusion. Mild atelectasis seen in the left lung base. Upper abdomen: No acute findings. Musculoskeletal: No suspicious bone lesions identified. Right shoulder prosthesis again seen causing streak artifact through the upper thorax. Review of the MIP images confirms the above findings. IMPRESSION: No evidence of pulmonary embolism. Mild left basilar atelectasis. Bilateral jugular and  submandibular lymphadenopathy in the visualized portion of the neck, which appears increased since previous PET-CT. Consider neck CT contrast for further evaluation. Electronically Signed   By: Danae Orleans M.D.   On: 12/28/2022 17:58   CT HEAD WO CONTRAST ( )  Result Date: 12/28/2022 CLINICAL DATA:  Neuro deficit, acute, stroke suspected Syncope/presyncope, cerebrovascular cause suspected EXAM: CT HEAD WITHOUT CONTRAST TECHNIQUE: Contiguous axial images were obtained from the base of the skull through the vertex without intravenous contrast. RADIATION DOSE REDUCTION: This exam was performed according to the departmental dose-optimization program which includes automated exposure control, adjustment of the mA and/or kV according to patient size and/or use of iterative reconstruction technique. COMPARISON:  MRI head 10/27/2021, CT head 04/04/2021 FINDINGS: Brain: No evidence of large-territorial acute infarction. No parenchymal hemorrhage. No mass lesion. No extra-axial collection. No mass effect or midline shift. No hydrocephalus. Basilar cisterns are patent. Vascular: No hyperdense vessel. Skull: No acute fracture or focal lesion. Sinuses/Orbits: Paranasal sinuses and mastoid air cells are clear. The orbits are unremarkable. Other: None. IMPRESSION: No acute intracranial abnormality. Electronically Signed   By: Tish Frederickson M.D.   On:  12/28/2022 17:42   DG Chest Port 1 View  Result Date: 12/28/2022 CLINICAL DATA:  Syncope EXAM: PORTABLE CHEST 1 VIEW COMPARISON:  Chest x-ray October 30, 2022 FINDINGS: Right chest wall port catheter in place, terminating near the superior cavoatrial junction. The cardiomediastinal silhouette is unchanged in contour. No focal pulmonary opacity. No pleural effusion or pneumothorax. The visualized upper abdomen is unremarkable. Surgical clips projecting over bilateral axillae. Right shoulder arthroplasty in place. No acute osseous abnormality. IMPRESSION: No acute  cardiopulmonary abnormality. Electronically Signed   By: Jacob Moores M.D.   On: 12/28/2022 17:04   NM PET Image Restag (PS) Skull Base To Thigh  Result Date: 12/15/2022 CLINICAL DATA:  Subsequent treatment strategy for T-cell non-Hodgkin's lymphoma. EXAM: NUCLEAR MEDICINE PET SKULL BASE TO THIGH TECHNIQUE: 9.96 mCi F-18 FDG was injected intravenously. Full-ring PET imaging was performed from the skull base to thigh after the radiotracer. CT data was obtained and used for attenuation correction and anatomic localization. Fasting blood glucose: 111 mg/dl COMPARISON:  PET-CT 13/04/6577. FINDINGS: Mediastinal blood pool activity: SUV max 2.3 Liver activity: SUV max 3.3 NECK: Enlarged right-sided level 4 lymph node (axial image 28 of series 4) measuring 1.4 cm in short axis (SUVmax = 10.2). Enlarged level 5A lymph node (axial image 28 of series 4) measuring 11 mm in short axis (SUVmax = 8.2). Borderline enlarged left level 5A lymph node (axial image 28 of series 4) measuring 8 mm in short axis (SUVmax = 10.0) . Enlarged right submandibular (level 1B) lymph node (axial image 29 of series 4) measuring 1 cm in short axis (SUVmax = 11.0). Incidental CT findings: None. CHEST: No hypermetabolic mediastinal or hilar nodes. No suspicious pulmonary nodules on the CT scan. Incidental CT findings: Right internal jugular single-lumen Port-A-Cath with tip terminating in the right atrium. Aortic atherosclerosis. ABDOMEN/PELVIS: No abnormal hypermetabolic activity within the liver, pancreas, adrenal glands, or spleen. No hypermetabolic lymph nodes in the abdomen or pelvis. Incidental CT findings: Status post cholecystectomy. Aortic atherosclerosis. SKELETON: No focal hypermetabolic activity to suggest skeletal metastasis. Incidental CT findings: Status post right shoulder arthroplasty and bilateral hip arthroplasty. IMPRESSION: 1. Recurrent hypermetabolic cervical lymphadenopathy bilaterally, as detailed above indicative of  recurrent disease ([place] 5). 2. No additional sites of disease recurrence in the chest, abdomen or pelvis. 3. Aortic atherosclerosis. Electronically Signed   By: Trudie Reed M.D.   On: 12/15/2022 09:30     ASSESSMENT & PLAN:  Tahesha Skeet is a 72 y.o. female with   1.  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 Plan -Results of excisional lymph node biopsy showing peripheral T-cell lymphoma not otherwise specified which is CD30 negative were discussed in detail with the patient. PET CT scan was previously reviewed and she appears to have at least stage II disease but depending on the liver lesion etiology this could represent stage IV disease. She has had anthracycline exposure as a part of her AC regimen for previous treatment of breast cancer and is therefore limited in her total lifetime anthracycline use. CD 30 negative status precludes use of Adcentris.  2. Anal Squamous Cell Carcinoma, cT2N0M0-currently in remission.  Details as noted above   3. 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 Completed tamoxifen 09/2007 - 09/2012 -2008: stage pT2N0 left breast invasive ductal carcinoma, triple negative, grade 3; s/p lumpectomy in 07/2007 and bilateral mastectomy 11/2007 (path negative for residual carcinoma in both breasts); s/p adjuvant  chemo AC-T and radiation  PLAN:  -patient is s/p 6 cycles of R-CEOP -Discussed lab results on 01/03/2023 with patient in detail. CBC showed WBC of 3.5K, hemoglobin of 10.7, and platelets of 279K. -Discussed CT head/neck 12/29/2022 which revealed no compression of blood vessel -If diarrhea remains bothersome, discussed option of long acting Octreotide injections to improve diarrhea symptoms. Advised patient to generally avoid laxatives. -Patient shall continue to follow-up with GI to address her chronic diarrhea. -discussed proceeding treatment options in  detail with patient, such targeted therapy, chemotherapy, or other medications -discussed option of [facility] referral for additional opinion regarding treatment -Patient is scheduled to receive a biopsy May 7th 2024 -will discuss second line treatment options once biopsy results reveal  FOLLOW UP: RTC with Dr Candise Che 1 week after LN biopsy in about 2-3 weeks  The total time spent in the appointment was *** minutes* .  All of the patient's questions were answered with apparent satisfaction. The patient knows to call the clinic with any problems, questions or concerns.   Wyvonnia Lora MD MS AAHIVMS [facility] Hematology/Oncology Physician [facility]  .*Total Encounter Time as defined by the Centers for Medicare and Medicaid Services includes, in addition to the face-to-face time of a patient visit (documented in the note above) non-face-to-face time: obtaining and reviewing outside history, ordering and reviewing medications, tests or procedures, care coordination (communications with other health care professionals or caregivers) and documentation in the medical record.    I,Mitra Faeizi,acting as a Neurosurgeon for Wyvonnia Lora, MD.,have documented all relevant documentation on the behalf of Wyvonnia Lora, MD,as directed by  Wyvonnia Lora, MD while in the presence of Wyvonnia Lora, MD.  ***

## 2023-01-04 ENCOUNTER — Other Ambulatory Visit: Payer: Self-pay

## 2023-01-06 DIAGNOSIS — I10 Essential (primary) hypertension: Secondary | ICD-10-CM | POA: Diagnosis not present

## 2023-01-06 DIAGNOSIS — E7800 Pure hypercholesterolemia, unspecified: Secondary | ICD-10-CM | POA: Diagnosis not present

## 2023-01-06 DIAGNOSIS — M150 Primary generalized (osteo)arthritis: Secondary | ICD-10-CM | POA: Diagnosis not present

## 2023-01-06 DIAGNOSIS — K219 Gastro-esophageal reflux disease without esophagitis: Secondary | ICD-10-CM | POA: Diagnosis not present

## 2023-01-06 DIAGNOSIS — F331 Major depressive disorder, recurrent, moderate: Secondary | ICD-10-CM | POA: Diagnosis not present

## 2023-01-07 ENCOUNTER — Telehealth: Payer: Self-pay | Admitting: Hematology

## 2023-01-08 ENCOUNTER — Other Ambulatory Visit: Payer: Self-pay

## 2023-01-08 ENCOUNTER — Encounter: Payer: Self-pay | Admitting: Hematology

## 2023-01-08 ENCOUNTER — Other Ambulatory Visit: Payer: Self-pay | Admitting: Hematology

## 2023-01-08 MED ORDER — METHOCARBAMOL 750 MG PO TABS: 750.0000 mg | ORAL_TABLET | Freq: Three times a day (TID) | ORAL | 0 refills | Status: DC | PRN

## 2023-01-09 ENCOUNTER — Encounter: Payer: Self-pay | Admitting: Hematology

## 2023-01-10 DIAGNOSIS — C8448 Peripheral T-cell lymphoma, not classified, lymph nodes of multiple sites: Secondary | ICD-10-CM | POA: Diagnosis not present

## 2023-01-10 DIAGNOSIS — Z6833 Body mass index (BMI) 33.0-33.9, adult: Secondary | ICD-10-CM | POA: Diagnosis not present

## 2023-01-10 DIAGNOSIS — R55 Syncope and collapse: Secondary | ICD-10-CM | POA: Diagnosis not present

## 2023-01-10 DIAGNOSIS — L509 Urticaria, unspecified: Secondary | ICD-10-CM | POA: Diagnosis not present

## 2023-01-10 DIAGNOSIS — Z23 Encounter for immunization: Secondary | ICD-10-CM | POA: Diagnosis not present

## 2023-01-12 ENCOUNTER — Encounter: Payer: Self-pay | Admitting: Hematology

## 2023-01-17 DIAGNOSIS — R2689 Other abnormalities of gait and mobility: Secondary | ICD-10-CM | POA: Diagnosis not present

## 2023-01-17 DIAGNOSIS — M6281 Muscle weakness (generalized): Secondary | ICD-10-CM | POA: Diagnosis not present

## 2023-01-20 ENCOUNTER — Other Ambulatory Visit: Payer: Self-pay | Admitting: Radiology

## 2023-01-20 DIAGNOSIS — R590 Localized enlarged lymph nodes: Secondary | ICD-10-CM

## 2023-01-20 NOTE — Progress Notes (Incomplete)
Referring Physician(s): [name]  Supervising Physician: Simonne Come  Patient Status:  [facility] OP  Chief Complaint:  " I am having a neck biopsy"  Subjective: Patient known to IR service from PICC placement x 2 in 2020, right cervical lymph node biopsy on 06/10/2022 and Port-A-Cath placement on 06/25/2022.  She is a 72 year old female with past medical history significant for anxiety, arthritis, depression, GERD, hiatal hernia, migraines, hyperlipidemia, hypertension, lumbar stenosis, prediabetes, bilateral breast CVA in 2008, squamous cell cancer of the anus in 2020 and T-cell non-Hodgkin's lymphoma in 2023; presents now with persistent lymphadenopathy and latest CT neck revealing multiple enlarged abnormal cervical lymph nodes right greater than left. She is scheduled today for image guided right cervical lymph node biopsy for further evaluation.     Past Medical History:  Diagnosis Date   Anal cancer (HCC) dx'd 05/2019   Anxiety    Arthritis    Bilateral breast cancer (HCC) 10/21/2013   Breast cancer (HCC)    Cancer (HCC)    Depression    Gallstones    GERD (gastroesophageal reflux disease)    doesn't take any meds for this   Headache    h/o migraines       History of bladder infections    History of hiatal hernia    History of migraine    Hyperlipidemia    takes Simvastatin daily   Hypertension    takes Hyzaar   Joint pain    Joint swelling    Lumbar stenosis    Neuropathy 09/07/2013   Pneumonia    Pre-diabetes    Past Surgical History:  Procedure Laterality Date   ABDOMINAL HYSTERECTOMY     bone spur removed from left foot     CHOLECYSTECTOMY     COLONOSCOPY     COLONOSCOPY WITH PROPOFOL Bilateral 12/07/2021   Procedure: COLONOSCOPY WITH PROPOFOL;  Surgeon: Vida Rigger, MD;  Location: [facility];  Service: Endoscopy;  Laterality: Bilateral;   double mastectomy      ESOPHAGOGASTRODUODENOSCOPY N/A 12/07/2021   Procedure: ESOPHAGOGASTRODUODENOSCOPY  (EGD);  Surgeon: Vida Rigger, MD;  Location: [facility];  Service: Endoscopy;  Laterality: N/A;   HERNIA REPAIR     umbilical   IR IMAGING GUIDED PORT INSERTION  06/25/2022   right knee arthroscopy     right shoulder arthroscopy     surgery for hiatal hernia     TONSILLECTOMY     TOTAL HIP ARTHROPLASTY Left 02/12/2013   TOTAL HIP ARTHROPLASTY Left 02/12/2013   Procedure: LEFT TOTAL HIP ARTHROPLASTY;  Surgeon: Nestor Lewandowsky, MD;  Location: [facility] OR;  Service: Orthopedics;  Laterality: Left;   TOTAL HIP ARTHROPLASTY Right 05/03/2013   Procedure: TOTAL HIP ARTHROPLASTY;  Surgeon: Nestor Lewandowsky, MD;  Location: [facility] OR;  Service: Orthopedics;  Laterality: Right;   TOTAL KNEE ARTHROPLASTY Right 02/27/2015   Procedure: TOTAL KNEE ARTHROPLASTY;  Surgeon: Gean Birchwood, MD;  Location: [facility] OR;  Service: Orthopedics;  Laterality: Right;   TOTAL SHOULDER ARTHROPLASTY Right 09/12/2016   Procedure: RIGHT TOTAL SHOULDER ARTHROPLASTY;  Surgeon: Jones Broom, MD;  Location: [facility] OR;  Service: Orthopedics;  Laterality: Right;  RIGHT TOTAL SHOULDER ARTHROPLASTY      Allergies: Tramadol, Codeine, and Vicodin [hydrocodone-acetaminophen]  Medications: Prior to Admission medications   Medication Sig Start Date End Date Taking? Authorizing Provider  aspirin-acetaminophen-caffeine (EXCEDRIN MIGRAINE) [phone] MG tablet Take 2 tablets by mouth every 6 (six) hours as needed for migraine.    [provider]  diclofenac (VOLTAREN)  75 MG EC tablet Take 75 mg by mouth 2 (two) times daily as needed for mild pain.    [provider]  diphenoxylate-atropine (LOMOTIL) 2.5-0.025 MG tablet Take 2 tablets by mouth 4 (four) times daily as needed for diarrhea or loose stools. 11/05/22   Drema Dallas, MD  DULoxetine (CYMBALTA) 60 MG capsule Take 1 capsule (60 mg total) by mouth 2 (two) times daily. Take 1 capsule (30 mg) at bedtime for 1 week, if no side effects, then increase to 2 capsules (60 mg) thereafter.  12/20/22   Antony Madura, MD  lidocaine (LMX) 4 % cream Apply topically 2 (two) times daily as needed (pain). 11/05/22   Drema Dallas, MD  losartan (COZAAR) 100 MG tablet Take 100 mg by mouth daily. 01/14/17   [provider]  Menthol-Methyl Salicylate (SALONPAS PAIN RELIEF PATCH EX) Apply 1 patch topically daily as needed (pain).    [provider]  methocarbamol (ROBAXIN) 750 MG tablet Take 1 tablet (750 mg total) by mouth every 8 (eight) hours as needed for muscle spasms. 01/08/23   [name], MD  Nystatin (GERHARDT'S BUTT CREAM) CREA Apply 1 Application topically 2 (two) times daily. Patient taking differently: Apply 1 Application topically 2 (two) times daily as needed for irritation. 11/05/22   Drema Dallas, MD  nystatin (MYCOSTATIN/NYSTOP) powder Apply 1 Application topically 3 (three) times daily. Patient taking differently: Apply 1 Application topically 3 (three) times daily as needed (redness, rash). 08/30/22   Walisiewicz, Kaitlyn E, PA-C  pantoprazole (PROTONIX) 40 MG tablet Take 1 tablet (40 mg total) by mouth daily. Patient taking differently: Take 40 mg by mouth daily as needed (for acid reflux). 11/06/22   Drema Dallas, MD  prochlorperazine (COMPAZINE) 10 MG tablet Take 1 tablet (10 mg total) by mouth every 6 (six) hours as needed for nausea or vomiting. 06/27/22   [name], MD     Vital Signs:      Code Status:   Physical Exam  Imaging: No results found.  Labs:  CBC: Recent Labs    11/20/22 1003 12/28/22 1555 12/30/22 0404 01/03/23 1021  WBC 4.6 3.9* 3.3* 3.5*  HGB 10.4* 10.4* 10.1* 10.7*  HCT 32.6* 34.4* 31.4* 33.8*  PLT 248 232 211 279    COAGS: Recent Labs    06/10/22 1130  INR 1.0    BMP: Recent Labs    12/28/22 1555 12/29/22 0500 12/30/22 0404 01/03/23 1021  NA 141 139 136 142  K 2.9* 2.8* 3.3* 4.0  CL 105 118* 104 110  CO2 25 17* 22 26  GLUCOSE 102* 94 102* 95  BUN 10 7* 7* 19  CALCIUM  8.8* 6.5* 8.7* 9.5  CREATININE 0.93 0.66 0.81 1.01*  GFRNONAA >60 >60 >60 60*    LIVER FUNCTION TESTS: Recent Labs    11/05/22 0341 11/20/22 1003 12/28/22 1555 01/03/23 1021  BILITOT 0.3 0.4 0.7 0.4  AST 20 18 21 18   ALT 20 10 10 13   ALKPHOS 80 79 80 92  PROT 5.4* 7.2 6.4* 7.2  ALBUMIN 2.7* 3.7 3.1* 3.8    Assessment and Plan: 72 year old female with past medical history significant for anxiety, arthritis, depression, GERD, hiatal hernia, migraines, hyperlipidemia, hypertension, lumbar stenosis, prediabetes, bilateral breast CVA in 2008, squamous cell cancer of the anus in 2020 and T-cell non-Hodgkin's lymphoma in 2023; presents now with persistent lymphadenopathy and latest CT neck revealing multiple enlarged abnormal cervical lymph nodes right greater than left. She  is scheduled today for image guided right cervical lymph node biopsy for further evaluation.Risks and benefits of procedure was discussed with the patient including, but not limited to bleeding, infection, damage to adjacent structures or low yield requiring additional tests.  All of the questions were answered and there is agreement to proceed.  Consent signed and in chart.    Electronically Signed: D. Jeananne Rama, PA-C 01/20/2023, 3:18 PM   I spent a total of 20 minutes at the the patient's bedside AND on the patient's hospital floor or unit, greater than 50% of which was counseling/coordinating care for image guided right cervical lymph node biopsy

## 2023-01-21 ENCOUNTER — Ambulatory Visit (HOSPITAL_COMMUNITY)
Admission: RE | Admit: 2023-01-21 | Discharge: 2023-01-21 | Disposition: A | Discharge status: Home / Self Care | Payer: PPO | Source: Ambulatory Visit | Attending: Hematology | Admitting: Hematology

## 2023-01-21 ENCOUNTER — Encounter (HOSPITAL_COMMUNITY): Payer: Self-pay

## 2023-01-21 ENCOUNTER — Other Ambulatory Visit: Payer: Self-pay

## 2023-01-21 DIAGNOSIS — K449 Diaphragmatic hernia without obstruction or gangrene: Secondary | ICD-10-CM | POA: Insufficient documentation

## 2023-01-21 DIAGNOSIS — R7303 Prediabetes: Secondary | ICD-10-CM | POA: Insufficient documentation

## 2023-01-21 DIAGNOSIS — I10 Essential (primary) hypertension: Secondary | ICD-10-CM | POA: Insufficient documentation

## 2023-01-21 DIAGNOSIS — F419 Anxiety disorder, unspecified: Secondary | ICD-10-CM | POA: Insufficient documentation

## 2023-01-21 DIAGNOSIS — R590 Localized enlarged lymph nodes: Secondary | ICD-10-CM | POA: Insufficient documentation

## 2023-01-21 DIAGNOSIS — E785 Hyperlipidemia, unspecified: Secondary | ICD-10-CM | POA: Insufficient documentation

## 2023-01-21 DIAGNOSIS — G43909 Migraine, unspecified, not intractable, without status migrainosus: Secondary | ICD-10-CM | POA: Diagnosis not present

## 2023-01-21 DIAGNOSIS — C785 Secondary malignant neoplasm of large intestine and rectum: Secondary | ICD-10-CM | POA: Insufficient documentation

## 2023-01-21 DIAGNOSIS — C8580 Other specified types of non-Hodgkin lymphoma, unspecified site: Secondary | ICD-10-CM | POA: Insufficient documentation

## 2023-01-21 DIAGNOSIS — C8590 Non-Hodgkin lymphoma, unspecified, unspecified site: Secondary | ICD-10-CM | POA: Insufficient documentation

## 2023-01-21 DIAGNOSIS — F32A Depression, unspecified: Secondary | ICD-10-CM | POA: Diagnosis not present

## 2023-01-21 DIAGNOSIS — M1990 Unspecified osteoarthritis, unspecified site: Secondary | ICD-10-CM | POA: Insufficient documentation

## 2023-01-21 DIAGNOSIS — K219 Gastro-esophageal reflux disease without esophagitis: Secondary | ICD-10-CM | POA: Diagnosis not present

## 2023-01-21 DIAGNOSIS — M48061 Spinal stenosis, lumbar region without neurogenic claudication: Secondary | ICD-10-CM | POA: Diagnosis not present

## 2023-01-21 LAB — PROTIME-INR
INR: 1.1 (ref 0.8–1.2)
Prothrombin Time: 14 seconds (ref 11.4–15.2)

## 2023-01-21 LAB — CBC WITH DIFFERENTIAL/PLATELET
Abs Immature Granulocytes: 0.03 10*3/uL (ref 0.00–0.07)
Basophils Absolute: 0 10*3/uL (ref 0.0–0.1)
Basophils Relative: 0 %
Eosinophils Absolute: 0.1 10*3/uL (ref 0.0–0.5)
Eosinophils Relative: 2 %
HCT: 34.7 % — ABNORMAL LOW (ref 36.0–46.0)
Hemoglobin: 10.4 g/dL — ABNORMAL LOW (ref 12.0–15.0)
Immature Granulocytes: 1 %
Lymphocytes Relative: 25 %
Lymphs Abs: 0.9 10*3/uL (ref 0.7–4.0)
MCH: 25.8 pg — ABNORMAL LOW (ref 26.0–34.0)
MCHC: 30 g/dL (ref 30.0–36.0)
MCV: 86.1 fL (ref 80.0–100.0)
Monocytes Absolute: 0.4 10*3/uL (ref 0.1–1.0)
Monocytes Relative: 10 %
Neutro Abs: 2.3 10*3/uL (ref 1.7–7.7)
Neutrophils Relative %: 62 %
Platelets: 240 10*3/uL (ref 150–400)
RBC: 4.03 MIL/uL (ref 3.87–5.11)
RDW: 15.9 % — ABNORMAL HIGH (ref 11.5–15.5)
WBC: 3.7 10*3/uL — ABNORMAL LOW (ref 4.0–10.5)
nRBC: 0 % (ref 0.0–0.2)

## 2023-01-21 LAB — BASIC METABOLIC PANEL
Anion gap: 5 (ref 5–15)
BUN: 15 mg/dL (ref 8–23)
CO2: 26 mmol/L (ref 22–32)
Calcium: 8.9 mg/dL (ref 8.9–10.3)
Chloride: 108 mmol/L (ref 98–111)
Creatinine, Ser: 0.82 mg/dL (ref 0.44–1.00)
GFR, Estimated: >60 mL/min (ref >60)
Glucose, Bld: 99 mg/dL (ref 70–99)
Potassium: 3.6 mmol/L (ref 3.5–5.1)
Sodium: 139 mmol/L (ref 135–145)

## 2023-01-21 MED ORDER — MIDAZOLAM HCL 2 MG/2ML IJ SOLN
INTRAMUSCULAR | Status: AC | PRN
  Administered 2023-01-21 (×2): 1 mg via INTRAVENOUS

## 2023-01-21 MED ORDER — HEPARIN SOD (PORK) LOCK FLUSH 100 UNIT/ML IV SOLN
500.0000 [IU] | Freq: Once | INTRAVENOUS | Status: AC
  Administered 2023-01-21: 500 [IU] via INTRAVENOUS
  Filled 2023-01-21: qty 5

## 2023-01-21 MED ORDER — LIDOCAINE HCL (PF) 1 % IJ SOLN
INTRAMUSCULAR | Status: AC | PRN
  Administered 2023-01-21: 10 mL

## 2023-01-21 MED ORDER — FENTANYL CITRATE (PF) 100 MCG/2ML IJ SOLN
INTRAMUSCULAR | Status: AC | PRN
  Administered 2023-01-21 (×2): 50 ug via INTRAVENOUS

## 2023-01-21 MED ORDER — FENTANYL CITRATE (PF) 100 MCG/2ML IJ SOLN
INTRAMUSCULAR | Status: AC
  Filled 2023-01-21: qty 2

## 2023-01-21 MED ORDER — LIDOCAINE HCL 1 % IJ SOLN
INTRAMUSCULAR | Status: AC
  Filled 2023-01-21: qty 20

## 2023-01-21 MED ORDER — MIDAZOLAM HCL 2 MG/2ML IJ SOLN
INTRAMUSCULAR | Status: AC
  Filled 2023-01-21: qty 2

## 2023-01-21 MED ORDER — SODIUM CHLORIDE 0.9 % IV SOLN: INTRAVENOUS | Status: DC

## 2023-01-21 NOTE — Discharge Instructions (Signed)
Please call Interventional Radiology clinic [phone] with any questions or concerns.   You may remove your dressing and shower tomorrow.    Needle Biopsy, Care After These instructions tell you how to care for yourself after your procedure. Your doctor may also give you more specific instructions. Call your doctor if youhave any problems or questions. What can I expect after the procedure? After the procedure, it is common to have: Soreness. Bruising. Mild pain. Follow these instructions at home:  Return to your normal activities as told by your doctor. Ask your doctor what activities are safe for you. Take over-the-counter and prescription medicines only as told by your doctor. Wash your hands with soap and water before you change your bandage (dressing). If you cannot use soap and water, use hand sanitizer. Follow instructions from your doctor about: How to take care of your puncture site. When and how to change your bandage. When to remove your bandage. Check your puncture site every day for signs of infection. Watch for: Redness, swelling, or pain. Fluid or blood. Pus or a bad smell. Warmth. Do not take baths, swim, or use a hot tub until your doctor approves. Ask your doctor if you may take showers. You may only be allowed to take sponge baths. Keep all follow-up visits as told by your doctor. This is important. Contact a doctor if you have: A fever. Redness, swelling, or pain at the puncture site, and it lasts longer than a few days. Fluid, blood, or pus coming from the puncture site. Warmth coming from the puncture site. Get help right away if: You have a lot of bleeding from the puncture site. Summary After the procedure, it is common to have soreness, bruising, or mild pain at the puncture site. Check your puncture site every day for signs of infection, such as redness, swelling, or pain. Get help right away if you have severe bleeding from your puncture  site. This information is not intended to replace advice given to you by your health care provider. Make sure you discuss any questions you have with your healthcare provider. Document Revised: 03/02/2020 Document Reviewed: 03/02/2020 Elsevier Patient Education  2022 Else[employer] Inc.   Moderate Conscious Sedation, Adult, Care After This sheet gives you information about how to care for yourself after your procedure. Your health care provider may also give you more specific instructions. If you have problems or questions, contact your health care provider. What can I expect after the procedure? After the procedure, it is common to have: Sleepiness for several hours. Impaired judgment for several hours. Difficulty with balance. Vomiting if you eat too soon. Follow these instructions at home: For the time period you were told by your health care provider: Rest. Do not participate in activities where you could fall or become injured. Do not drive or use machinery. Do not drink alcohol. Do not take sleeping pills or medicines that cause drowsiness. Do not make important decisions or sign legal documents. Do not take care of children on your own.      Eating and drinking Follow the diet recommended by your health care provider. Drink enough fluid to keep your urine pale yellow. If you vomit: Drink water, juice, or soup when you can drink without vomiting. Make sure you have little or no nausea before eating solid foods.   General instructions Take over-the-counter and prescription medicines only as told by your health care provider. Have a responsible adult stay with you for the time you are told.   It is important to have someone help care for you until you are awake and alert. Do not smoke. Keep all follow-up visits as told by your health care provider. This is important. Contact a health care provider if: You are still sleepy or having trouble with balance after 24 hours. You feel  light-headed. You keep feeling nauseous or you keep vomiting. You develop a rash. You have a fever. You have redness or swelling around the IV site. Get help right away if: You have trouble breathing. You have new-onset confusion at home. Summary After the procedure, it is common to feel sleepy, have impaired judgment, or feel nauseous if you eat too soon. Rest after you get home. Know the things you should not do after the procedure. Follow the diet recommended by your health care provider and drink enough fluid to keep your urine pale yellow. Get help right away if you have trouble breathing or new-onset confusion at home. This information is not intended to replace advice given to you by your health care provider. Make sure you discuss any questions you have with your health care provider. Document Revised: 12/31/2019 Document Reviewed: 07/29/2019 Elsevier Patient Education  2021 Elsevier Inc.     

## 2023-01-21 NOTE — Procedures (Signed)
Pre Procedure Dx: Lymphoma Post Procedural Dx: Same  Technically successful US guided biopsy of indeterminate left cervical lymph node.   EBL: Trace No immediate complications.   Katherina Right, MD Pager #: [phone]

## 2023-01-23 ENCOUNTER — Other Ambulatory Visit: Payer: Self-pay

## 2023-01-23 DIAGNOSIS — R590 Localized enlarged lymph nodes: Secondary | ICD-10-CM

## 2023-01-23 DIAGNOSIS — Z170 Estrogen receptor positive status [ER+]: Secondary | ICD-10-CM

## 2023-01-24 ENCOUNTER — Encounter: Payer: Self-pay | Admitting: Hematology

## 2023-01-24 LAB — SURGICAL PATHOLOGY

## 2023-01-31 ENCOUNTER — Inpatient Hospital Stay: Payer: PPO | Attending: Hematology | Admitting: Hematology

## 2023-01-31 ENCOUNTER — Encounter: Payer: Self-pay | Admitting: Hematology

## 2023-01-31 ENCOUNTER — Inpatient Hospital Stay: Payer: PPO

## 2023-01-31 VITALS — BP 171/89 | HR 73 | Temp 98.0°F | Resp 16 | Wt 201.0 lb

## 2023-01-31 DIAGNOSIS — Z923 Personal history of irradiation: Secondary | ICD-10-CM | POA: Diagnosis not present

## 2023-01-31 DIAGNOSIS — C8448 Peripheral T-cell lymphoma, not classified, lymph nodes of multiple sites: Secondary | ICD-10-CM

## 2023-01-31 DIAGNOSIS — Z853 Personal history of malignant neoplasm of breast: Secondary | ICD-10-CM | POA: Diagnosis not present

## 2023-01-31 DIAGNOSIS — Z9013 Acquired absence of bilateral breasts and nipples: Secondary | ICD-10-CM | POA: Insufficient documentation

## 2023-01-31 DIAGNOSIS — C210 Malignant neoplasm of anus, unspecified: Secondary | ICD-10-CM | POA: Insufficient documentation

## 2023-01-31 DIAGNOSIS — Z9221 Personal history of antineoplastic chemotherapy: Secondary | ICD-10-CM | POA: Diagnosis not present

## 2023-01-31 DIAGNOSIS — K529 Noninfective gastroenteritis and colitis, unspecified: Secondary | ICD-10-CM | POA: Insufficient documentation

## 2023-01-31 DIAGNOSIS — C50412 Malignant neoplasm of upper-outer quadrant of left female breast: Secondary | ICD-10-CM

## 2023-01-31 DIAGNOSIS — C50411 Malignant neoplasm of upper-outer quadrant of right female breast: Secondary | ICD-10-CM

## 2023-01-31 DIAGNOSIS — Z95828 Presence of other vascular implants and grafts: Secondary | ICD-10-CM

## 2023-01-31 LAB — CBC WITH DIFFERENTIAL (CANCER CENTER ONLY)
Abs Immature Granulocytes: 0.01 10*3/uL (ref 0.00–0.07)
Basophils Absolute: 0 10*3/uL (ref 0.0–0.1)
Basophils Relative: 0 %
Eosinophils Absolute: 0.1 10*3/uL (ref 0.0–0.5)
Eosinophils Relative: 2 %
HCT: 35.7 % — ABNORMAL LOW (ref 36.0–46.0)
Hemoglobin: 11.2 g/dL — ABNORMAL LOW (ref 12.0–15.0)
Immature Granulocytes: 0 %
Lymphocytes Relative: 26 %
Lymphs Abs: 0.9 10*3/uL (ref 0.7–4.0)
MCH: 26.1 pg (ref 26.0–34.0)
MCHC: 31.4 g/dL (ref 30.0–36.0)
MCV: 83.2 fL (ref 80.0–100.0)
Monocytes Absolute: 0.5 10*3/uL (ref 0.1–1.0)
Monocytes Relative: 13 %
Neutro Abs: 2 10*3/uL (ref 1.7–7.7)
Neutrophils Relative %: 59 %
Platelet Count: 230 10*3/uL (ref 150–400)
RBC: 4.29 MIL/uL (ref 3.87–5.11)
RDW: 16.2 % — ABNORMAL HIGH (ref 11.5–15.5)
WBC Count: 3.5 10*3/uL — ABNORMAL LOW (ref 4.0–10.5)
nRBC: 0 % (ref 0.0–0.2)

## 2023-01-31 LAB — CMP (CANCER CENTER ONLY)
ALT: 11 U/L (ref 0–44)
AST: 17 U/L (ref 15–41)
Albumin: 3.9 g/dL (ref 3.5–5.0)
Alkaline Phosphatase: 100 U/L (ref 38–126)
Anion gap: 6 (ref 5–15)
BUN: 16 mg/dL (ref 8–23)
CO2: 26 mmol/L (ref 22–32)
Calcium: 9.2 mg/dL (ref 8.9–10.3)
Chloride: 110 mmol/L (ref 98–111)
Creatinine: 0.92 mg/dL (ref 0.44–1.00)
GFR, Estimated: >60 mL/min (ref >60)
Glucose, Bld: 97 mg/dL (ref 70–99)
Potassium: 3.5 mmol/L (ref 3.5–5.1)
Sodium: 142 mmol/L (ref 135–145)
Total Bilirubin: 0.4 mg/dL (ref 0.3–1.2)
Total Protein: 7.1 g/dL (ref 6.5–8.1)

## 2023-01-31 LAB — LACTATE DEHYDROGENASE: LDH: 191 U/L (ref 98–192)

## 2023-01-31 MED ORDER — SODIUM CHLORIDE 0.9% FLUSH
10.0000 mL | Freq: Once | INTRAVENOUS | Status: AC
  Administered 2023-01-31: 10 mL

## 2023-01-31 MED ORDER — HEPARIN SOD (PORK) LOCK FLUSH 100 UNIT/ML IV SOLN
500.0000 [IU] | Freq: Once | INTRAVENOUS | Status: AC
  Administered 2023-01-31: 500 [IU]

## 2023-01-31 NOTE — Progress Notes (Signed)
[facility]   [phone] [phone]    HEMATOLOGY ONCOLOGY CLINIC VISIT   Patient Care Team: [name], MD as PCP - General (Family Medicine) [name], MD as PCP - Hematology/Oncology (Hematology) [name], MD as Consulting Physician (Gastroenterology) [name], MD as Consulting Physician (Hematology) [name], NP as Nurse Practitioner (Nurse Practitioner) [name], MD as Consulting Physician (Radiation Oncology) [name], MD as Consulting Physician (General Surgery) [name], MD as Attending Physician (Otolaryngology)  Date of Service: 01/31/23    CHIEF COMPLAINT: f/u for evaluation and management of T cell lymphoma.  SUMMARY OF ONCOLOGIC HISTORY: Oncology History Overview Note  Cancer Staging Anal cancer (HCC) Staging form: Anus, AJCC 8th Edition - Clinical stage from 06/28/2019: Stage IIA (cT2, cN0, cM0) - Signed by [name], NP on 06/28/2019  Breast cancer of upper-outer quadrant of left female breast [facility]) Staging form: Breast, AJCC 7th Edition - Clinical stage from 09/07/2013: Stage IIA (T2, N0, cM0) - Signed by [name], MD on 09/07/2013 - Pathologic: Stage IIA (T2, N0, cM0) - Signed by [name], MD on 09/07/2013  Breast cancer of upper-outer quadrant of right female breast [facility]) Staging form: Breast, AJCC 7th Edition - Clinical stage from 09/07/2013: Stage IA (T1a, N0, cM0) - Signed by [name], MD on 09/07/2013 - Pathologic: Stage IA (T1a, N0, cM0) - Signed by [name], MD on 09/07/2013    Breast cancer of upper-outer quadrant of left female breast (HCC)  07/10/2007 Procedure   US biopsy showed invasive ductal carcinoma 0.8cm, Nottingham grade 3   07/30/2007 Surgery   She had left breast lumpectomy and LN biopsy which showed high grade invasive ductal cancer Nottingham grade 3, 2.7 cm, ER/PR/Her2 neu negative   11/24/2007 Surgery   Patient elected for bilateral  mastectomy   09/07/2008 - 03/08/2009 Chemotherapy   dates are approximate: she received adriamycin and cytoxan followed by Taxol   03/08/2009 - 05/08/2009 Radiation Therapy   dates are approximate, she received XRT    07/09/2021 Imaging   CT CAP  IMPRESSION: 1. No evidence to suggest locally recurrent ianorectal neoplasm or metastatic disease in the chest, abdomen or pelvis. 2. Status post bilateral modified radical mastectomy and bilateral axillary lymph node dissection. 3. Three small supraumbilical ventral hernias containing only omental fat. No associated bowel incarceration or obstruction at this time. 4. Aortic atherosclerosis. 5. Additional incidental findings, as above.   Breast cancer of upper-outer quadrant of right female breast (HCC)  07/10/2006 Procedure   stereotactic biopsy showed atypical hyperplasia   10/23/2006 Surgery   right lumpectomy showed invasive ductal ca (Nottingham grade 1)  2mm and DCIS, ER/PR positive Her 2 negative   10/09/2007 - 10/08/2012 Chemotherapy   Patient was placed on Tamoxifen   03/24/2017 Imaging   No acute findings. No evidence of recurrent carcinoma or metastatic disease   07/09/2021 Imaging   CT CAP  IMPRESSION: 1. No evidence to suggest locally recurrent ianorectal neoplasm or metastatic disease in the chest, abdomen or pelvis. 2. Status post bilateral modified radical mastectomy and bilateral axillary lymph node dissection. 3. Three small supraumbilical ventral hernias containing only omental fat. No associated bowel incarceration or obstruction at this time. 4. Aortic atherosclerosis. 5. Additional incidental findings, as above.   Anal cancer (HCC)  06/04/2019 Procedure   Colonoscopy per Dr. [name]: Findings-the digital rectal exam revealed a 3 cm diameter firm rectal mass.  The mass was noncircumferential and located predominantly at  the right bowel wall at the anorectal junction. A nonobstructing mass was found at the  anus and in the rectum    06/04/2019 Initial Biopsy   Follow pathology: Large intestine, rectum biopsy: Invasive well to moderately differentiated squamous cell carcinoma.  No rectal mucosa present.  There is strong diffuse expression of P 16 immunostain.  CDX 2, p63 and mCEA immunostains are also used in the diagnostic work-up of the case.   06/04/2019 Initial Diagnosis   Anal cancer (HCC)   06/21/2019 PET scan   IMPRESSION: 1. Anorectal primary. No hypermetabolic metastatic disease within the chest, abdomen, or pelvis. Perirectal nodes, at least 1 of which is new since 02/26/2018 CT, suspicious based on size and interval development. 2. Mild limitations secondary to beam hardening artifact from bilateral hip arthroplasty. 3.  Aortic Atherosclerosis (ICD10-I70.0).   06/28/2019 Cancer Staging   Staging form: Anus, AJCC 8th Edition - Clinical stage from 06/28/2019: Stage IIA (cT2, cN0, cM0) - Signed by [name], NP on 06/28/2019   06/28/2019 - 07/26/2019 Chemotherapy   concurrent chemoRT with Mitomycin and 5FU on week 1 and week 5 on starting 06/28/19. Last dose on 07/26/19   06/28/2019 - 08/09/2019 Radiation Therapy   concurrent chemoRT with Dr Mitzi Hansen 06/28/19-08/09/19   11/01/2019 PET scan   IMPRESSION: 1. Marked interval decrease in hypermetabolism noted at the level of the anal rectal primary. No evidence for hypermetabolic metastatic disease in the chest, abdomen, or pelvis. The perirectal lymph nodes identified previously have resolved in the interval. 2.  Aortic Atherosclerois (ICD10-170.0)   08/07/2020 Imaging   CT CAP  IMPRESSION: 1. No findings for residual/recurrent anal tumor, regional lymphadenopathy or metastatic disease. 2. Status post cholecystectomy. No biliary dilatation. 3. Small anterior abdominal wall hernia containing fat. 4. Aortic atherosclerosis.   Aortic Atherosclerosis (ICD10-I70.0).     07/09/2021 Imaging   CT CAP  IMPRESSION: 1. No  evidence to suggest locally recurrent ianorectal neoplasm or metastatic disease in the chest, abdomen or pelvis. 2. Status post bilateral modified radical mastectomy and bilateral axillary lymph node dissection. 3. Three small supraumbilical ventral hernias containing only omental fat. No associated bowel incarceration or obstruction at this time. 4. Aortic atherosclerosis. 5. Additional incidental findings, as above.   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 Mosetta Putt for evaluation and management of newly diagnosed T-cell non-Hodgkin's lymphoma.  Patient has a history of bilateral breast cancer status post mastectomies.  -2007: s/p right lumpectomy for ADH in 08/2006 showed DCIS ER/PR+, grade 1. 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 She was adopted, no known family history    She subsequently was diagnosed with anal squamous cell carcinoma cT2 N0 M0 diagnosed in September 2020.Workup showed a 3 cm mass at the anorectal junction, biopsy confirmed well to moderately differentiated squamous cell carcinoma. 06/21/19 PET scan showed no metastasis.  -S/p concurrent chemoRT with Mitomycin and 5FU on 08/09/19. Now on surveillance. -surveillance CT CAP  07/09/21 was NED. -surveillance colonoscopy done for rectal bleeding on 12/07/21 showed only radiation changes. Her rectal bleeding has resolved.  Recently patient presented to her primary care physician  on 05/23/2022 with her new right neck lump which has been progressively enlarging.  She also subsequently developed a left neck swelling.  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  She has had a PET CT scan done on 06/19/2022 which showed hypermetabolic bilateral neck lymphadenopathy.  Also noted to have a new 1 cm focus of metabolic activity in segment 4 of the liver.  This is etiology indeterminate. Lumbar scoliosis and small supraumbilical hernia.  Patient has had Port-A-Cath placement done on 06/25/2022. Echocardiogram was done on 06/17/2022 and shows normal ejection fraction of 70 to 75% with grade 1 diastolic dysfunction.  Right ventricular systolic function within normal limits.  INTERVAL HISTORY  Cynthia Velasquez is a 72 y/o female here for evaluation and management of T-cell non-Hodgkin's lymphoma. Patient was last seen by me on 01/03/2023 and reported a syncope episode, increased size of left neck lymph node with soreness, blood in the stools, and fluctuating constipation.  Today, her daughter is on call. Patient complains of frequent fatigue. She has lost 3 pounds since 01/21/2023.  Patient reports endorsing a possible yeast infection. She has been applying Lidocaine cream to her groin.  She complains of endorsing daily itchy break-outs in her arms, legs, back, and groin. She takes Benadryl which resolves symptoms. She also uses antifungal Nystatin cream does which resolves symptoms after about 15 minutes.  She complains that when lying down on her side, she experiences discomfort in her anterior and bilateral neck with a cramping sensation. She does complain of lower back pain  She reports that her diarrhea fluctuates but is not endorsed on a daily basis. She denies any fever, chills, or new lumps/bumps. She does complain of night sweats intermittently.  She reports that she  will be traveling to [place] between July 24 through April 17, 2023.  ROS  10 Point review of Systems was done is negative except as noted above.   MEDICAL HISTORY:  Past Medical History:  Diagnosis Date   Anal cancer (HCC) dx'd 05/2019   Anxiety    Arthritis    Bilateral breast cancer (HCC) 10/21/2013   Breast cancer (HCC)    Cancer (HCC)    Depression    Gallstones    GERD (gastroesophageal reflux disease)    doesn't take any meds for this   Headache    h/o migraines       History of bladder infections    History of hiatal hernia    History of migraine    Hyperlipidemia    takes Simvastatin daily   Hypertension    takes Hyzaar   Joint pain    Joint swelling    Lumbar stenosis    Neuropathy 09/07/2013   Pneumonia    Pre-diabetes     SURGICAL HISTORY: Past Surgical History:  Procedure Laterality Date   ABDOMINAL HYSTERECTOMY     bone spur removed from left foot     CHOLECYSTECTOMY     COLONOSCOPY     COLONOSCOPY WITH PROPOFOL Bilateral 12/07/2021   Procedure: COLONOSCOPY WITH PROPOFOL;  Surgeon: Vida Rigger, MD;  Location: [facility];  Service: Endoscopy;  Laterality: Bilateral;   double mastectomy      ESOPHAGOGASTRODUODENOSCOPY N/A 12/07/2021   Procedure: ESOPHAGOGASTRODUODENOSCOPY (EGD);  Surgeon: Vida Rigger, MD;  Location: [facility];  Service: Endoscopy;  Laterality: N/A;   HERNIA REPAIR     umbilical   IR IMAGING GUIDED PORT INSERTION  06/25/2022   right knee arthroscopy     right shoulder arthroscopy     surgery for hiatal hernia     TONSILLECTOMY     TOTAL HIP ARTHROPLASTY Left 02/12/2013   TOTAL HIP ARTHROPLASTY Left 02/12/2013   Procedure: LEFT TOTAL HIP ARTHROPLASTY;  Surgeon: Nestor Lewandowsky, MD;  Location: [facility] OR;  Service: Orthopedics;  Laterality: Left;   TOTAL HIP ARTHROPLASTY Right 05/03/2013   Procedure: TOTAL HIP ARTHROPLASTY;  Surgeon: Nestor Lewandowsky, MD;  Location: [facility] OR;  Service: Orthopedics;  Laterality: Right;   TOTAL KNEE ARTHROPLASTY  Right 02/27/2015   Procedure: TOTAL KNEE ARTHROPLASTY;  Surgeon: Gean Birchwood, MD;  Location: [facility] OR;  Service: Orthopedics;  Laterality: Right;   TOTAL SHOULDER ARTHROPLASTY Right 09/12/2016   Procedure: RIGHT TOTAL SHOULDER ARTHROPLASTY;  Surgeon: Jones Broom, MD;  Location: [facility] OR;  Service: Orthopedics;  Laterality: Right;  RIGHT TOTAL SHOULDER ARTHROPLASTY    I have reviewed the social history and family history with the patient and they are unchanged from previous note.  ALLERGIES:  is allergic to tramadol, codeine, and vicodin [hydrocodone-acetaminophen].  MEDICATIONS:  Current Outpatient Medications  Medication Sig Dispense Refill   aspirin-acetaminophen-caffeine (EXCEDRIN MIGRAINE) 250-250-65 MG tablet Take 2 tablets by mouth every 6 (six) hours as needed for migraine.     diclofenac (VOLTAREN) 75 MG EC tablet Take 75 mg by mouth 2 (two) times daily as needed for mild pain.     diphenoxylate-atropine (LOMOTIL) 2.5-0.025 MG tablet Take 2 tablets by mouth 4 (four) times daily as needed for diarrhea or loose stools. 30 tablet 0   DULoxetine (CYMBALTA) 60 MG capsule Take 1 capsule (60 mg total) by mouth 2 (two) times daily. Take 1 capsule (30 mg) at bedtime for 1 week, if no side effects, then increase to 2 capsules (60 mg) thereafter. 60 capsule 11   lidocaine (LMX) 4 % cream Apply topically 2 (two) times daily as needed (pain). 30 g 0   losartan (COZAAR) 100 MG tablet Take 100 mg by mouth daily.  6   Menthol-Methyl Salicylate (SALONPAS PAIN RELIEF PATCH EX) Apply 1 patch topically daily as needed (pain).     methocarbamol (ROBAXIN) 750 MG tablet Take 1 tablet (750 mg total) by mouth every 8 (eight) hours as needed for muscle spasms. 20 tablet 0   Nystatin (GERHARDT'S BUTT CREAM) CREA Apply 1 Application topically 2 (two) times daily. (Patient taking differently: Apply 1 Application topically 2 (two) times daily as needed for irritation.) 30 each 0   nystatin (MYCOSTATIN/NYSTOP) powder  Apply 1 Application topically 3 (three) times daily. (Patient taking differently: Apply 1 Application topically 3 (three) times daily as needed (redness, rash).) 15 g 0   pantoprazole (PROTONIX) 40 MG tablet Take 1 tablet (40 mg total) by mouth daily. (Patient taking differently: Take 40 mg by mouth daily as needed (for acid reflux).) 30 tablet 0   prochlorperazine (COMPAZINE) 10 MG tablet Take 1 tablet (10 mg total) by mouth every 6 (six) hours as needed for nausea or vomiting. 30 tablet 6   No current facility-administered medications for this visit.    PHYSICAL EXAMINATION: .BP (!) 171/89 (BP Location: Left Arm, Patient Position: Sitting)   Pulse 73   Temp 98 F (36.7 C) (Oral)   Resp 16   Wt 201 lb (  91.2 kg)   SpO2 97%   BMI 33.45 kg/m    GENERAL:alert, in no acute distress and comfortable SKIN: no acute rashes, no significant lesions EYES: conjunctiva are pink and non-injected, sclera anicteric OROPHARYNX: MMM, no exudates, no oropharyngeal erythema or ulceration NECK: supple, no JVD LYMPH:  no palpable lymphadenopathy in the cervical, axillary or inguinal regions LUNGS: clear to auscultation b/l with normal respiratory effort HEART: regular rate & rhythm ABDOMEN:  normoactive bowel sounds , non tender, not distended. Extremity: no pedal edema PSYCH: alert & oriented x 3 with fluent speech NEURO: no focal motor/sensory deficits   LABORATORY DATA:  I have reviewed the data as listed    Latest Ref Rng & Units 01/31/2023    9:40 AM 01/21/2023   12:04 PM 01/03/2023   10:21 AM  CBC  WBC 4.0 - 10.5 K/uL 3.5  3.7  3.5   Hemoglobin 12.0 - 15.0 g/dL 16.1  09.6  04.5   Hematocrit 36.0 - 46.0 % 35.7  34.7  33.8   Platelets 150 - 400 K/uL 230  240  279   .    Latest Ref Rng & Units 01/31/2023    9:40 AM 01/21/2023   12:04 PM 01/03/2023   10:21 AM  CMP  Glucose 70 - 99 mg/dL 97  99  95   BUN 8 - 23 mg/dL 16  15  19    Creatinine 0.44 - 1.00 mg/dL 4.09  8.11  9.14   Sodium 135 -  145 mmol/L 142  139  142   Potassium 3.5 - 5.1 mmol/L 3.5  3.6  4.0   Chloride 98 - 111 mmol/L 110  108  110   CO2 22 - 32 mmol/L 26  26  26    Calcium 8.9 - 10.3 mg/dL 9.2  8.9  9.5   Total Protein 6.5 - 8.1 g/dL 7.1   7.2   Total Bilirubin 0.3 - 1.2 mg/dL 0.4   0.4   Alkaline Phos 38 - 126 U/L 100   92   AST 15 - 41 U/L 17   18   ALT 0 - 44 U/L 11   13       Legend: (L) Low (H) High  . Lab Results  Component Value Date   LDH 191 01/31/2023     01/21/2023 Needle Core Biopsy:           RADIOGRAPHIC STUDIES: .[name] CORE BIOPSY (LYMPH NODES)  Result Date: 01/21/2023 INDICATION: History of T-cell lymphoma, now with bilateral cervical lymphadenopathy. Please perform ultrasound-guided cervical lymph node biopsy for tissue diagnostic purposes. EXAM: ULTRASOUND-GUIDED LEFT CERVICAL LYMPH NODE BIOPSY COMPARISON:  Neck CT-12/29/2022 MEDICATIONS: None ANESTHESIA/SEDATION: Moderate (conscious) sedation was employed during this procedure. A total of Versed 2 mg and Fentanyl 100 mcg was administered intravenously. Moderate Sedation Time: 19 minutes. The patient's level of consciousness and vital signs were monitored continuously by radiology nursing throughout the procedure under my direct supervision. COMPLICATIONS: None immediate. TECHNIQUE: Informed written consent was obtained from the patient after a discussion of the risks, benefits and alternatives to treatment. Questions regarding the procedure were encouraged and answered. Initial ultrasound scanning demonstrated multiple enlarged bilateral cervical lymph nodes. Dominant left-sided cervical lymph node with targeted for biopsy measuring 2.5 x 1.5 x 1.5 cm (image 31), correlating with the dominant cervical lymph node seen on preceding neck CTA (image 227, series 5) given location and sonographic window and lack of sonographic evidence of necrosis. An ultrasound image was saved for documentation  purposes. The procedure was planned. A timeout  was performed prior to the initiation of the procedure. The operative was prepped and draped in the usual sterile fashion, and a sterile drape was applied covering the operative field. A timeout was performed prior to the initiation of the procedure. Local anesthesia was provided with 1% lidocaine with epinephrine. Under direct ultrasound guidance, an 18 gauge core needle device was utilized to obtain to obtain 7 core needle biopsies of the indeterminate left cervical lymph node. The samples were placed in saline and submitted to pathology. The needle was removed and superficial hemostasis was achieved with manual compression. Post procedure scan was negative for significant hematoma. A dressing was applied. The patient tolerated the procedure well without immediate postprocedural complication. IMPRESSION: Technically successful ultrasound guided biopsy of dominant indeterminate left-sided cervical lymph node. Electronically Signed   By: Simonne Come M.D.   On: 01/21/2023 14:58     ASSESSMENT & PLAN:    Zyaira Loges is a 72 y.o.  female with   1. Relapsed 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 Plan -Results of excisional lymph node biopsy showing peripheral T-cell lymphoma not otherwise specified which is CD30 negative were discussed in detail with the patient. PET CT scan was previously reviewed and she appears to have at least stage II disease but depending on the liver lesion etiology this could represent stage IV disease. She has had anthracycline exposure as a part of her AC regimen for previous treatment of breast cancer and is therefore limited in her total lifetime anthracycline use. CD 30 negative status precludes use of Adcentris.  2. Anal Squamous Cell Carcinoma, cT2N0M0-currently in remission.  Details as noted above   3. 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 Completed tamoxifen 09/2007  - 09/2012 -2008: stage pT2N0 left breast invasive ductal carcinoma, triple negative, grade 3; s/p lumpectomy in 07/2007 and bilateral mastectomy 11/2007 (path negative for residual carcinoma in both breasts); s/p adjuvant chemo AC-T and radiation  PLAN:  -Discussed lab results from 01/21/2023 in detail with patient. CBC showed WBC of 3.7K, hemoglobin of 10.4, and platelets of 240K. -Discussed results of 01/21/2023 needle core biopsy which revealed that the lymph node of neck showed that T-cell lymphoma has returned -last PET scan showed that disease was only in the neck -informed patient that lymphoma may cause fixed skin reactions or it may be related to allergies -recommend daily Zyrtec or Claritin to address skin break-outs -chemotherapy regimen may not be able to be achieved at a high enough dose to completely knock out disease -combination therapy is showing resistance  -discussed option of referral of an additional opinion and clinical trials -discussed potential proceeding options such as: Long-term second-line treatment involving targeted therapies with goal of reaching remission. Discussed details of Belinostat treatment. Dicussed potential side effects such as fatigue, blood counts, diarrhea, skin rashes. Would involve PET scans every 2-3 months.  Aggressive treatment involving bone marrow transplant. Unsure if this is a good option given patient's history. -discussed option to connect patient with transplant physician at [facility] -will order 5 days of Prednisone to treat break outs in arms, legs, back, and groin. In addition, would recommend OTC Claritin 10 mg daily as well as anti-itch Sarna cream. -answered all of patient's and daughter's questions in detail -Patient will be traveling to [place] between July 24 through April 17, 2023. -Patient shall continue to follow-up with GI  to address her chronic diarrhea.  FOLLOW UP: Schedule to start Belinostat in 1 week Labs on D1 of each  cycle of treatment Labs and MD visit C1D10 of treatment for toxicity check  The total time spent in the appointment was 40 minutes* .  All of the patient's questions were answered with apparent satisfaction. The patient knows to call the clinic with any problems, questions or concerns.   Wyvonnia Lora MD MS AAHIVMS [facility] Hematology/Oncology Physician [facility]  .*Total Encounter Time as defined by the Centers for Medicare and Medicaid Services includes, in addition to the face-to-face time of a patient visit (documented in the note above) non-face-to-face time: obtaining and reviewing outside history, ordering and reviewing medications, tests or procedures, care coordination (communications with other health care professionals or caregivers) and documentation in the medical record.    I,Mitra Faeizi,acting as a Neurosurgeon for Wyvonnia Lora, MD.,have documented all relevant documentation on the behalf of Wyvonnia Lora, MD,as directed by  Wyvonnia Lora, MD while in the presence of Wyvonnia Lora, MD.  .I have reviewed the above documentation for accuracy and completeness, and I agree with the above. [name] MD

## 2023-02-05 DIAGNOSIS — C8590 Non-Hodgkin lymphoma, unspecified, unspecified site: Secondary | ICD-10-CM | POA: Diagnosis not present

## 2023-02-05 DIAGNOSIS — R350 Frequency of micturition: Secondary | ICD-10-CM | POA: Diagnosis not present

## 2023-02-05 DIAGNOSIS — R7303 Prediabetes: Secondary | ICD-10-CM | POA: Diagnosis not present

## 2023-02-06 ENCOUNTER — Encounter: Payer: Self-pay | Admitting: Hematology

## 2023-02-07 ENCOUNTER — Encounter: Payer: Self-pay | Admitting: Hematology

## 2023-02-07 ENCOUNTER — Telehealth: Payer: Self-pay

## 2023-02-07 MED ORDER — ONDANSETRON HCL 8 MG PO TABS
8.0000 mg | ORAL_TABLET | Freq: Three times a day (TID) | ORAL | 1 refills | Status: DC | PRN
Start: 2023-02-07 — End: 2023-08-03

## 2023-02-07 MED ORDER — PROCHLORPERAZINE MALEATE 10 MG PO TABS
10.0000 mg | ORAL_TABLET | Freq: Four times a day (QID) | ORAL | 1 refills | Status: DC | PRN
Start: 2023-02-07 — End: 2023-08-03

## 2023-02-07 NOTE — Progress Notes (Signed)
DISCONTINUE ON PATHWAY REGIMEN - Lymphoma and CLL     A cycle is every 21 days:     Prednisone      Cyclophosphamide      Doxorubicin      Vincristine      Etoposide   **Always confirm dose/schedule in your pharmacy ordering system**  REASON: Disease Progression PRIOR TREATMENT: [id number]: CHOEP q21 Days x 6 Cycles TREATMENT RESPONSE: Complete Response (CR)  START ON PATHWAY REGIMEN - Lymphoma and CLL     A cycle is every 21 days:     Belinostat   **Always confirm dose/schedule in your pharmacy ordering system**  Patient Characteristics: T-Cell Lymphoma (Systemic), Second Line, Not a Transplant Candidate, CD30 Negative Disease Type: Not Applicable Disease Type: T-Cell Lymphoma (Systemic) Disease Type: Not Applicable Line of Therapy: Second Line Patient Characteristics: Not a Transplant Candidate CD30 Status: CD30 Negative Intent of Therapy: Curative Intent, Discussed with Patient

## 2023-02-07 NOTE — Telephone Encounter (Signed)
Attempted to contact pt regarding MyChart message.

## 2023-02-15 ENCOUNTER — Encounter: Payer: Self-pay | Admitting: Hematology

## 2023-02-17 ENCOUNTER — Telehealth: Payer: Self-pay | Admitting: Hematology

## 2023-02-18 ENCOUNTER — Other Ambulatory Visit: Payer: Self-pay

## 2023-02-19 ENCOUNTER — Telehealth: Payer: Self-pay | Admitting: Hematology

## 2023-02-19 ENCOUNTER — Encounter: Payer: Self-pay | Admitting: Hematology

## 2023-02-20 ENCOUNTER — Telehealth: Payer: Self-pay

## 2023-02-20 NOTE — Telephone Encounter (Signed)
Notified Patient by voicemail of completion of Leave Accommodation Form. Fax transmission confirmation received. Copy of form emailed to Patient email on file.

## 2023-02-21 ENCOUNTER — Encounter: Payer: Self-pay | Admitting: Hematology

## 2023-02-24 ENCOUNTER — Other Ambulatory Visit: Payer: Self-pay

## 2023-02-24 ENCOUNTER — Inpatient Hospital Stay: Payer: PPO

## 2023-02-24 ENCOUNTER — Inpatient Hospital Stay: Payer: PPO | Attending: Hematology

## 2023-02-24 VITALS — BP 137/80 | HR 70 | Temp 98.1°F | Resp 18 | Ht 65.0 in | Wt 199.8 lb

## 2023-02-24 DIAGNOSIS — Z5111 Encounter for antineoplastic chemotherapy: Secondary | ICD-10-CM | POA: Diagnosis present

## 2023-02-24 DIAGNOSIS — Z95828 Presence of other vascular implants and grafts: Secondary | ICD-10-CM

## 2023-02-24 DIAGNOSIS — C50412 Malignant neoplasm of upper-outer quadrant of left female breast: Secondary | ICD-10-CM

## 2023-02-24 DIAGNOSIS — C8448 Peripheral T-cell lymphoma, not classified, lymph nodes of multiple sites: Secondary | ICD-10-CM | POA: Diagnosis present

## 2023-02-24 DIAGNOSIS — C50411 Malignant neoplasm of upper-outer quadrant of right female breast: Secondary | ICD-10-CM

## 2023-02-24 LAB — CBC WITH DIFFERENTIAL (CANCER CENTER ONLY)
Abs Immature Granulocytes: 0.01 10*3/uL (ref 0.00–0.07)
Basophils Absolute: 0 10*3/uL (ref 0.0–0.1)
Basophils Relative: 0 %
Eosinophils Absolute: 0.1 10*3/uL (ref 0.0–0.5)
Eosinophils Relative: 1 %
HCT: 35.5 % — ABNORMAL LOW (ref 36.0–46.0)
Hemoglobin: 11.1 g/dL — ABNORMAL LOW (ref 12.0–15.0)
Immature Granulocytes: 0 %
Lymphocytes Relative: 33 %
Lymphs Abs: 1.3 10*3/uL (ref 0.7–4.0)
MCH: 25.8 pg — ABNORMAL LOW (ref 26.0–34.0)
MCHC: 31.3 g/dL (ref 30.0–36.0)
MCV: 82.6 fL (ref 80.0–100.0)
Monocytes Absolute: 0.5 10*3/uL (ref 0.1–1.0)
Monocytes Relative: 13 %
Neutro Abs: 2 10*3/uL (ref 1.7–7.7)
Neutrophils Relative %: 53 %
Platelet Count: 223 10*3/uL (ref 150–400)
RBC: 4.3 MIL/uL (ref 3.87–5.11)
RDW: 16.6 % — ABNORMAL HIGH (ref 11.5–15.5)
WBC Count: 3.8 10*3/uL — ABNORMAL LOW (ref 4.0–10.5)
nRBC: 0 % (ref 0.0–0.2)

## 2023-02-24 LAB — CMP (CANCER CENTER ONLY)
ALT: 10 U/L (ref 0–44)
AST: 16 U/L (ref 15–41)
Albumin: 4 g/dL (ref 3.5–5.0)
Alkaline Phosphatase: 97 U/L (ref 38–126)
Anion gap: 6 (ref 5–15)
BUN: 15 mg/dL (ref 8–23)
CO2: 27 mmol/L (ref 22–32)
Calcium: 9.5 mg/dL (ref 8.9–10.3)
Chloride: 107 mmol/L (ref 98–111)
Creatinine: 0.85 mg/dL (ref 0.44–1.00)
GFR, Estimated: >60 mL/min (ref >60)
Glucose, Bld: 97 mg/dL (ref 70–99)
Potassium: 3.6 mmol/L (ref 3.5–5.1)
Sodium: 140 mmol/L (ref 135–145)
Total Bilirubin: 0.3 mg/dL (ref 0.3–1.2)
Total Protein: 7.3 g/dL (ref 6.5–8.1)

## 2023-02-24 LAB — LACTATE DEHYDROGENASE: LDH: 204 U/L — ABNORMAL HIGH (ref 98–192)

## 2023-02-24 MED ORDER — SODIUM CHLORIDE 0.9 % IV SOLN: Freq: Once | INTRAVENOUS | Status: AC

## 2023-02-24 MED ORDER — ACETAMINOPHEN 325 MG PO TABS
650.0000 mg | ORAL_TABLET | Freq: Four times a day (QID) | ORAL | Status: DC | PRN
Start: 2023-02-24 — End: 2023-02-24
  Administered 2023-02-24: 650 mg via ORAL

## 2023-02-24 MED ORDER — ONDANSETRON HCL 4 MG/2ML IJ SOLN
INTRAMUSCULAR | Status: AC
  Filled 2023-02-24: qty 4

## 2023-02-24 MED ORDER — HEPARIN SOD (PORK) LOCK FLUSH 100 UNIT/ML IV SOLN
500.0000 [IU] | Freq: Once | INTRAVENOUS | Status: AC | PRN
  Administered 2023-02-24: 500 [IU]

## 2023-02-24 MED ORDER — ONDANSETRON HCL 4 MG/2ML IJ SOLN
8.0000 mg | Freq: Once | INTRAMUSCULAR | Status: AC
  Administered 2023-02-24: 8 mg via INTRAVENOUS

## 2023-02-24 MED ORDER — SODIUM CHLORIDE 0.9% FLUSH
10.0000 mL | Freq: Once | INTRAVENOUS | Status: AC
  Administered 2023-02-24: 10 mL

## 2023-02-24 MED ORDER — SODIUM CHLORIDE 0.9 % IV SOLN
1000.0000 mg/m2 | Freq: Once | INTRAVENOUS | Status: AC
  Administered 2023-02-24: 2000 mg via INTRAVENOUS
  Filled 2023-02-24: qty 40

## 2023-02-24 MED ORDER — SODIUM CHLORIDE 0.9 % IV SOLN: Freq: Once | INTRAVENOUS | Status: DC | PRN

## 2023-02-24 MED ORDER — ACETAMINOPHEN 325 MG PO TABS
ORAL_TABLET | ORAL | Status: AC
  Filled 2023-02-24: qty 2

## 2023-02-24 MED ORDER — PROCHLORPERAZINE MALEATE 10 MG PO TABS
10.0000 mg | ORAL_TABLET | Freq: Once | ORAL | Status: AC
  Administered 2023-02-24: 10 mg via ORAL
  Filled 2023-02-24: qty 1

## 2023-02-24 MED ORDER — FAMOTIDINE IN NACL 20-0.9 MG/50ML-% IV SOLN
20.0000 mg | Freq: Once | INTRAVENOUS | Status: AC | PRN
  Administered 2023-02-24: 20 mg via INTRAVENOUS

## 2023-02-24 MED ORDER — SODIUM CHLORIDE 0.9% FLUSH
10.0000 mL | INTRAVENOUS | Status: DC | PRN
  Administered 2023-02-24: 10 mL

## 2023-02-24 NOTE — Patient Instructions (Signed)
[place] [facility]  Discharge Instructions: Thank you for choosing [facility] to provide your oncology and hematology care.   If you have a lab appointment with the Cancer Center, please go directly to the Cancer Center and check in at the registration area.   Wear comfortable clothing and clothing appropriate for easy access to any Portacath or PICC line.   We strive to give you quality time with your provider. You may need to reschedule your appointment if you arrive late (15 or more minutes).  Arriving late affects you and other patients whose appointments are after yours.  Also, if you miss three or more appointments without notifying the office, you may be dismissed from the clinic at the provider's discretion.      For prescription refill requests, have your pharmacy contact our office and allow 72 hours for refills to be completed.    Today you received the following chemotherapy and/or immunotherapy agents  Belinostat    To help prevent nausea and vomiting after your treatment, we encourage you to take your nausea medication as directed.  BELOW ARE SYMPTOMS THAT SHOULD BE REPORTED IMMEDIATELY: *FEVER GREATER THAN 100.4 F (38 C) OR HIGHER *CHILLS OR SWEATING *NAUSEA AND VOMITING THAT IS NOT CONTROLLED WITH YOUR NAUSEA MEDICATION *UNUSUAL SHORTNESS OF BREATH *UNUSUAL BRUISING OR BLEEDING *URINARY PROBLEMS (pain or burning when urinating, or frequent urination) *BOWEL PROBLEMS (unusual diarrhea, constipation, pain near the anus) TENDERNESS IN MOUTH AND THROAT WITH OR WITHOUT PRESENCE OF ULCERS (sore throat, sores in mouth, or a toothache) UNUSUAL RASH, SWELLING OR PAIN  UNUSUAL VAGINAL DISCHARGE OR ITCHING   Items with * indicate a potential emergency and should be followed up as soon as possible or go to the Emergency Department if any problems should occur.  Please show the CHEMOTHERAPY ALERT CARD or IMMUNOTHERAPY ALERT CARD at  check-in to the Emergency Department and triage nurse.  Should you have questions after your visit or need to cancel or reschedule your appointment, please contact [place] [facility]  Dept: [phone]  and follow the prompts.  Office hours are 8:00 a.m. to 4:30 p.m. Monday - Friday. Please note that voicemails left after 4:00 p.m. may not be returned until the following business day.  We are closed weekends and major holidays. You have access to a nurse at all times for urgent questions. Please call the main number to the clinic Dept: [phone] and follow the prompts.   For any non-urgent questions, you may also contact your provider using MyChart. We now offer e-Visits for anyone 50 and older to request care online for non-urgent symptoms. For details visit [email or web address].   Also download the MyChart app! Go to the app store, search "MyChart", open the app, select [place], and log in with your MyChart username and password.  Belinostat Injection What is this medication? BELINOSTAT (beh LIH noh STAT) treats lymphoma. It works by slowing down the growth of cancer cells. This medicine may be used for other purposes; ask your health care provider or pharmacist if you have questions. COMMON BRAND NAME(S): Beleodaq What should I tell my care team before I take this medication? They need to know if you have any of these conditions: Carry the UGT1A1*28 gene Infection Kidney disease Liver disease Low blood cell levels (white cells, platelets, or red blood cells) An unusual or allergic reaction to belinostat, other medications, foods, dyes, or preservatives If you or your partner  are pregnant or trying to get pregnant Breast-feeding How should I use this medication? This medication is infused into a vein. It is given by your care team in a hospital or clinic setting. Talk to your care team about the use of this medication in children. Special care may  be needed. Overdosage: If you think you have taken too much of this medicine contact a poison control center or emergency room at once. NOTE: This medicine is only for you. Do not share this medicine with others. What if I miss a dose? Keep appointments for follow-up doses. It is important not to miss your dose. Call your care team if you are unable to keep an appointment. What may interact with this medication? Do not take this medication with any of the following: Atazanavir This medication may affect how other medications work, and other medications may affect the way this medication works. Talk with your care team about all of the medications you take. They may suggest changes to your treatment plan to lower the risk of side effects and to make sure your medications work as intended. This list may not describe all possible interactions. Give your health care provider a list of all the medicines, herbs, non-prescription drugs, or dietary supplements you use. Also tell them if you smoke, drink alcohol, or use illegal drugs. Some items may interact with your medicine. What should I watch for while using this medication? Your condition will be monitored carefully while you are receiving this medication. This medication may make you feel generally unwell. This is not uncommon as chemotherapy can affect healthy cells as well as cancer cells. Report any side effects. Continue your course of treatment even though you feel ill unless your care team tells you to stop. You may need blood work while taking this medication. This medication may increase your risk to bruise or bleed. Call your care team if you notice any unusual bleeding. This medication may increase your risk of getting an infection. Call your care team for advice if you get a fever, chills, sore throat, or other symptoms of a cold or flu. Do not treat yourself. Try to avoid being around people who are sick. Call your care team if you are  around anyone with measles, chickenpox, or if you develop sores or blisters that do not heal properly. Be careful brushing or flossing your teeth or using a toothpick because you may get an infection or bleed more easily. If you have any dental work done, tell your dentist you are receiving this medication. Talk to your care team if you or your partner wish to become pregnant or think either of you might be pregnant. This medication can cause serious birth defects if taken during pregnancy or for 6 months after stopping treatment. A reliable form of contraception is recommended while taking this medication and for 6 months after stopping treatment. Talk to your care team about effective forms of contraception. Use a condom during sex and for 3 months after stopping treatment. Tell your care team right away if you think your partner might be pregnant. This medication can cause serious birth defects. This medication may cause infertility. Talk to your care team if you are concerned about your fertility. Do not breast-feed while taking this medication and for 2 weeks after stopping therapy. What side effects may I notice from receiving this medication? Side effects that you should report to your care team as soon as possible: Allergic reactions--skin rash, itching, hives, swelling  of the face, lips, tongue, or throat Infection--fever, chills, cough, sore throat, wounds that don't heal, pain or trouble when passing urine, general feeling of discomfort or being unwell Liver injury--right upper belly pain, loss of appetite, nausea, light-colored stool, dark yellow or brown urine, yellowing skin or eyes, unusual weakness or fatigue Low red blood cell level--unusual weakness or fatigue, dizziness, headache, trouble breathing Tumor lysis syndrome (TLS)--nausea, vomiting, diarrhea, decrease in the amount of urine, dark urine, unusual weakness or fatigue, confusion, muscle pain or cramps, fast or irregular  heartbeat, joint pain Unusual bruising or bleeding Side effects that usually do not require medical attention (report to your care team if they continue or are bothersome): Diarrhea Fatigue Nausea Vomiting This list may not describe all possible side effects. Call your doctor for medical advice about side effects. You may report side effects to FDA at 1-800-FDA-1088. Where should I keep my medication? This medication is given in a hospital or clinic. It will not be stored at home. NOTE: This sheet is a summary. It may not cover all possible information. If you have questions about this medicine, talk to your doctor, pharmacist, or health care provider.  2024 Elsevier/Gold Standard (2021-11-28 00:00:00)

## 2023-02-24 NOTE — Progress Notes (Signed)
Hypersensitivity Reaction note  Date of event: 02/24/23 Time of event: 1719 Generic name of drug involved: belinostat Name of provider notified of the hypersensitivity reaction: Mohamed Was agent that likely caused hypersensitivity reaction added to Allergies List within EMR? yes Chain of events including reaction signs/symptoms, treatment administered, and outcome (e.g., drug resumed; drug discontinued; sent to Emergency Department; etc.)   Tenna Child, RN 02/24/2023 6:51 PM   1719 Pt reports nausea and headache during infusion. Infusion paused. Normal saline hung to gravity Pepcid IVPB given per MAR. Zofran order obtained from Dr. Yetta Numbers (On call MD) and given per [facility]. VSS   1749 Pt reports feeling nausea is going away and asked for Belinostat to be restarted.   1610 Pt reports that nausea remains gone, but has a headache still. Dr. Arbutus Ped notified. Tylenol ordered and given per [facility]  1645. VSS. Pt stil has headache, but desires to go home. VSS at discharge. She verbalizes that she will call if symptoms persist.

## 2023-02-25 ENCOUNTER — Other Ambulatory Visit: Payer: Self-pay | Admitting: Hematology

## 2023-02-25 ENCOUNTER — Inpatient Hospital Stay: Payer: PPO

## 2023-02-25 VITALS — BP 130/79 | HR 69 | Temp 98.2°F | Resp 18 | Wt 201.5 lb

## 2023-02-25 DIAGNOSIS — C8448 Peripheral T-cell lymphoma, not classified, lymph nodes of multiple sites: Secondary | ICD-10-CM

## 2023-02-25 DIAGNOSIS — Z5111 Encounter for antineoplastic chemotherapy: Secondary | ICD-10-CM | POA: Diagnosis not present

## 2023-02-25 MED ORDER — ACETAMINOPHEN 325 MG PO TABS
650.0000 mg | ORAL_TABLET | Freq: Once | ORAL | Status: AC
  Administered 2023-02-25: 650 mg via ORAL
  Filled 2023-02-25: qty 2

## 2023-02-25 MED ORDER — SODIUM CHLORIDE 0.9% FLUSH
10.0000 mL | INTRAVENOUS | Status: DC | PRN
  Administered 2023-02-25: 10 mL

## 2023-02-25 MED ORDER — PROCHLORPERAZINE MALEATE 10 MG PO TABS
10.0000 mg | ORAL_TABLET | Freq: Once | ORAL | Status: AC
  Administered 2023-02-25: 10 mg via ORAL
  Filled 2023-02-25: qty 1

## 2023-02-25 MED ORDER — LORAZEPAM 1 MG PO TABS
0.5000 mg | ORAL_TABLET | Freq: Once | ORAL | Status: AC
  Administered 2023-02-25: 0.5 mg via ORAL
  Filled 2023-02-25: qty 1

## 2023-02-25 MED ORDER — SODIUM CHLORIDE 0.9 % IV SOLN: Freq: Once | INTRAVENOUS | Status: AC

## 2023-02-25 MED ORDER — SODIUM CHLORIDE 0.9 % IV SOLN
1000.0000 mg/m2 | Freq: Once | INTRAVENOUS | Status: AC
  Administered 2023-02-25: 2000 mg via INTRAVENOUS
  Filled 2023-02-25: qty 40

## 2023-02-25 MED ORDER — HEPARIN SOD (PORK) LOCK FLUSH 100 UNIT/ML IV SOLN
500.0000 [IU] | Freq: Once | INTRAVENOUS | Status: AC | PRN
  Administered 2023-02-25: 500 [IU]

## 2023-02-25 NOTE — Patient Instructions (Addendum)
[place] [facility]  Discharge Instructions: Thank you for choosing [facility] to provide your oncology and hematology care.   If you have a lab appointment with the Cancer Center, please go directly to the Cancer Center and check in at the registration area.   Wear comfortable clothing and clothing appropriate for easy access to any Portacath or PICC line.   We strive to give you quality time with your provider. You may need to reschedule your appointment if you arrive late (15 or more minutes).  Arriving late affects you and other patients whose appointments are after yours.  Also, if you miss three or more appointments without notifying the office, you may be dismissed from the clinic at the provider's discretion.      For prescription refill requests, have your pharmacy contact our office and allow 72 hours for refills to be completed.    Today you received the following chemotherapy and/or immunotherapy agents Beleodaq.      To help prevent nausea and vomiting after your treatment, we encourage you to take your nausea medication as directed.  BELOW ARE SYMPTOMS THAT SHOULD BE REPORTED IMMEDIATELY: *FEVER GREATER THAN 100.4 F (38 C) OR HIGHER *CHILLS OR SWEATING *NAUSEA AND VOMITING THAT IS NOT CONTROLLED WITH YOUR NAUSEA MEDICATION *UNUSUAL SHORTNESS OF BREATH *UNUSUAL BRUISING OR BLEEDING *URINARY PROBLEMS (pain or burning when urinating, or frequent urination) *BOWEL PROBLEMS (unusual diarrhea, constipation, pain near the anus) TENDERNESS IN MOUTH AND THROAT WITH OR WITHOUT PRESENCE OF ULCERS (sore throat, sores in mouth, or a toothache) UNUSUAL RASH, SWELLING OR PAIN  UNUSUAL VAGINAL DISCHARGE OR ITCHING   Items with * indicate a potential emergency and should be followed up as soon as possible or go to the Emergency Department if any problems should occur.  Please show the CHEMOTHERAPY ALERT CARD or IMMUNOTHERAPY ALERT CARD at  check-in to the Emergency Department and triage nurse.  Should you have questions after your visit or need to cancel or reschedule your appointment, please contact [place] [facility]  Dept: [phone]  and follow the prompts.  Office hours are 8:00 a.m. to 4:30 p.m. Monday - Friday. Please note that voicemails left after 4:00 p.m. may not be returned until the following business day.  We are closed weekends and major holidays. You have access to a nurse at all times for urgent questions. Please call the main number to the clinic Dept: [phone] and follow the prompts.   For any non-urgent questions, you may also contact your provider using MyChart. We now offer e-Visits for anyone 58 and older to request care online for non-urgent symptoms. For details visit [email or web address].   Also download the MyChart app! Go to the app store, search "MyChart", open the app, select [place], and log in with your MyChart username and password.

## 2023-02-26 ENCOUNTER — Inpatient Hospital Stay: Payer: PPO

## 2023-02-26 ENCOUNTER — Other Ambulatory Visit: Payer: Self-pay

## 2023-02-26 VITALS — BP 156/91 | HR 78 | Temp 98.1°F | Resp 18

## 2023-02-26 DIAGNOSIS — Z5111 Encounter for antineoplastic chemotherapy: Secondary | ICD-10-CM | POA: Diagnosis not present

## 2023-02-26 DIAGNOSIS — C8448 Peripheral T-cell lymphoma, not classified, lymph nodes of multiple sites: Secondary | ICD-10-CM

## 2023-02-26 MED ORDER — HEPARIN SOD (PORK) LOCK FLUSH 100 UNIT/ML IV SOLN
500.0000 [IU] | Freq: Once | INTRAVENOUS | Status: AC | PRN
  Administered 2023-02-26: 500 [IU]

## 2023-02-26 MED ORDER — SODIUM CHLORIDE 0.9% FLUSH
10.0000 mL | INTRAVENOUS | Status: DC | PRN
  Administered 2023-02-26: 10 mL

## 2023-02-26 MED ORDER — PROCHLORPERAZINE MALEATE 10 MG PO TABS
10.0000 mg | ORAL_TABLET | Freq: Once | ORAL | Status: AC
  Administered 2023-02-26: 10 mg via ORAL
  Filled 2023-02-26: qty 1

## 2023-02-26 MED ORDER — SODIUM CHLORIDE 0.9 % IV SOLN
1000.0000 mg/m2 | Freq: Once | INTRAVENOUS | Status: AC
  Administered 2023-02-26: 2000 mg via INTRAVENOUS
  Filled 2023-02-26: qty 40

## 2023-02-26 MED ORDER — ACETAMINOPHEN 325 MG PO TABS
650.0000 mg | ORAL_TABLET | Freq: Once | ORAL | Status: AC
  Administered 2023-02-26: 650 mg via ORAL
  Filled 2023-02-26: qty 2

## 2023-02-26 MED ORDER — SODIUM CHLORIDE 0.9 % IV SOLN: Freq: Once | INTRAVENOUS | Status: AC

## 2023-02-26 MED ORDER — LORAZEPAM 1 MG PO TABS
0.5000 mg | ORAL_TABLET | Freq: Once | ORAL | Status: AC
  Administered 2023-02-26: 0.5 mg via ORAL
  Filled 2023-02-26: qty 1

## 2023-02-26 NOTE — Patient Instructions (Signed)
[place] [facility]  Discharge Instructions: Thank you for choosing [facility] to provide your oncology and hematology care.   If you have a lab appointment with the Cancer Center, please go directly to the Cancer Center and check in at the registration area.   Wear comfortable clothing and clothing appropriate for easy access to any Portacath or PICC line.   We strive to give you quality time with your provider. You may need to reschedule your appointment if you arrive late (15 or more minutes).  Arriving late affects you and other patients whose appointments are after yours.  Also, if you miss three or more appointments without notifying the office, you may be dismissed from the clinic at the provider's discretion.      For prescription refill requests, have your pharmacy contact our office and allow 72 hours for refills to be completed.    Today you received the following chemotherapy and/or immunotherapy agents : Belinostat      To help prevent nausea and vomiting after your treatment, we encourage you to take your nausea medication as directed.  BELOW ARE SYMPTOMS THAT SHOULD BE REPORTED IMMEDIATELY: *FEVER GREATER THAN 100.4 F (38 C) OR HIGHER *CHILLS OR SWEATING *NAUSEA AND VOMITING THAT IS NOT CONTROLLED WITH YOUR NAUSEA MEDICATION *UNUSUAL SHORTNESS OF BREATH *UNUSUAL BRUISING OR BLEEDING *URINARY PROBLEMS (pain or burning when urinating, or frequent urination) *BOWEL PROBLEMS (unusual diarrhea, constipation, pain near the anus) TENDERNESS IN MOUTH AND THROAT WITH OR WITHOUT PRESENCE OF ULCERS (sore throat, sores in mouth, or a toothache) UNUSUAL RASH, SWELLING OR PAIN  UNUSUAL VAGINAL DISCHARGE OR ITCHING   Items with * indicate a potential emergency and should be followed up as soon as possible or go to the Emergency Department if any problems should occur.  Please show the CHEMOTHERAPY ALERT CARD or IMMUNOTHERAPY ALERT CARD at  check-in to the Emergency Department and triage nurse.  Should you have questions after your visit or need to cancel or reschedule your appointment, please contact [place] [facility]  Dept: [phone]  and follow the prompts.  Office hours are 8:00 a.m. to 4:30 p.m. Monday - Friday. Please note that voicemails left after 4:00 p.m. may not be returned until the following business day.  We are closed weekends and major holidays. You have access to a nurse at all times for urgent questions. Please call the main number to the clinic Dept: [phone] and follow the prompts.   For any non-urgent questions, you may also contact your provider using MyChart. We now offer e-Visits for anyone 52 and older to request care online for non-urgent symptoms. For details visit [email or web address].   Also download the MyChart app! Go to the app store, search "MyChart", open the app, select , and log in with your MyChart username and password.

## 2023-02-27 ENCOUNTER — Inpatient Hospital Stay: Payer: PPO

## 2023-02-27 VITALS — BP 178/84 | HR 64 | Temp 98.0°F | Resp 18

## 2023-02-27 DIAGNOSIS — C8448 Peripheral T-cell lymphoma, not classified, lymph nodes of multiple sites: Secondary | ICD-10-CM

## 2023-02-27 DIAGNOSIS — Z5111 Encounter for antineoplastic chemotherapy: Secondary | ICD-10-CM | POA: Diagnosis not present

## 2023-02-27 MED ORDER — ACETAMINOPHEN 325 MG PO TABS
650.0000 mg | ORAL_TABLET | Freq: Once | ORAL | Status: AC
  Administered 2023-02-27: 650 mg via ORAL
  Filled 2023-02-27: qty 2

## 2023-02-27 MED ORDER — SODIUM CHLORIDE 0.9 % IV SOLN: Freq: Once | INTRAVENOUS | Status: AC

## 2023-02-27 MED ORDER — LORAZEPAM 1 MG PO TABS
0.5000 mg | ORAL_TABLET | Freq: Once | ORAL | Status: AC
  Administered 2023-02-27: 0.5 mg via ORAL
  Filled 2023-02-27: qty 1

## 2023-02-27 MED ORDER — ONDANSETRON HCL 4 MG/2ML IJ SOLN
8.0000 mg | Freq: Once | INTRAMUSCULAR | Status: AC
  Administered 2023-02-27: 8 mg via INTRAVENOUS
  Filled 2023-02-27: qty 4

## 2023-02-27 MED ORDER — HEPARIN SOD (PORK) LOCK FLUSH 100 UNIT/ML IV SOLN
500.0000 [IU] | Freq: Once | INTRAVENOUS | Status: AC | PRN
  Administered 2023-02-27: 500 [IU]

## 2023-02-27 MED ORDER — SODIUM CHLORIDE 0.9 % IV SOLN
1000.0000 mg/m2 | Freq: Once | INTRAVENOUS | Status: AC
  Administered 2023-02-27: 2000 mg via INTRAVENOUS
  Filled 2023-02-27: qty 40

## 2023-02-27 MED ORDER — PROCHLORPERAZINE MALEATE 10 MG PO TABS
10.0000 mg | ORAL_TABLET | Freq: Once | ORAL | Status: AC
  Administered 2023-02-27: 10 mg via ORAL
  Filled 2023-02-27: qty 1

## 2023-02-27 MED ORDER — SODIUM CHLORIDE 0.9% FLUSH
10.0000 mL | INTRAVENOUS | Status: DC | PRN
  Administered 2023-02-27: 10 mL

## 2023-02-27 NOTE — Progress Notes (Signed)
During flush at the end of infusion, pt began experiencing sudden nausea and vomiting. MD Leonides Schanz notified and 8mg  Zofran given. Pt reported easing of nausea.   Pt requests infusion of Belinostat be run slower next time if possible to reduce side effects.

## 2023-02-27 NOTE — Patient Instructions (Signed)
[place] [facility]  Discharge Instructions: Thank you for choosing [facility] to provide your oncology and hematology care.   If you have a lab appointment with the Cancer Center, please go directly to the Cancer Center and check in at the registration area.   Wear comfortable clothing and clothing appropriate for easy access to any Portacath or PICC line.   We strive to give you quality time with your provider. You may need to reschedule your appointment if you arrive late (15 or more minutes).  Arriving late affects you and other patients whose appointments are after yours.  Also, if you miss three or more appointments without notifying the office, you may be dismissed from the clinic at the provider's discretion.      For prescription refill requests, have your pharmacy contact our office and allow 72 hours for refills to be completed.    Today you received the following chemotherapy and/or immunotherapy agents Beleodaq.      To help prevent nausea and vomiting after your treatment, we encourage you to take your nausea medication as directed.  BELOW ARE SYMPTOMS THAT SHOULD BE REPORTED IMMEDIATELY: *FEVER GREATER THAN 100.4 F (38 C) OR HIGHER *CHILLS OR SWEATING *NAUSEA AND VOMITING THAT IS NOT CONTROLLED WITH YOUR NAUSEA MEDICATION *UNUSUAL SHORTNESS OF BREATH *UNUSUAL BRUISING OR BLEEDING *URINARY PROBLEMS (pain or burning when urinating, or frequent urination) *BOWEL PROBLEMS (unusual diarrhea, constipation, pain near the anus) TENDERNESS IN MOUTH AND THROAT WITH OR WITHOUT PRESENCE OF ULCERS (sore throat, sores in mouth, or a toothache) UNUSUAL RASH, SWELLING OR PAIN  UNUSUAL VAGINAL DISCHARGE OR ITCHING   Items with * indicate a potential emergency and should be followed up as soon as possible or go to the Emergency Department if any problems should occur.  Please show the CHEMOTHERAPY ALERT CARD or IMMUNOTHERAPY ALERT CARD at  check-in to the Emergency Department and triage nurse.  Should you have questions after your visit or need to cancel or reschedule your appointment, please contact [place] [facility]  Dept: [phone]  and follow the prompts.  Office hours are 8:00 a.m. to 4:30 p.m. Monday - Friday. Please note that voicemails left after 4:00 p.m. may not be returned until the following business day.  We are closed weekends and major holidays. You have access to a nurse at all times for urgent questions. Please call the main number to the clinic Dept: [phone] and follow the prompts.   For any non-urgent questions, you may also contact your provider using MyChart. We now offer e-Visits for anyone 18 and older to request care online for non-urgent symptoms. For details visit [email or web address].   Also download the MyChart app! Go to the app store, search "MyChart", open the app, select [place], and log in with your MyChart username and password.   

## 2023-02-28 ENCOUNTER — Inpatient Hospital Stay: Payer: PPO

## 2023-02-28 VITALS — BP 143/80 | HR 77 | Temp 98.2°F | Resp 18

## 2023-02-28 DIAGNOSIS — Z5111 Encounter for antineoplastic chemotherapy: Secondary | ICD-10-CM | POA: Diagnosis not present

## 2023-02-28 DIAGNOSIS — C8448 Peripheral T-cell lymphoma, not classified, lymph nodes of multiple sites: Secondary | ICD-10-CM

## 2023-02-28 MED ORDER — LORAZEPAM 1 MG PO TABS
0.5000 mg | ORAL_TABLET | Freq: Once | ORAL | Status: AC
  Administered 2023-02-28: 0.5 mg via ORAL
  Filled 2023-02-28: qty 1

## 2023-02-28 MED ORDER — SODIUM CHLORIDE 0.9 % IV SOLN
1000.0000 mg/m2 | Freq: Once | INTRAVENOUS | Status: AC
  Administered 2023-02-28: 2000 mg via INTRAVENOUS
  Filled 2023-02-28: qty 40

## 2023-02-28 MED ORDER — HEPARIN SOD (PORK) LOCK FLUSH 100 UNIT/ML IV SOLN
500.0000 [IU] | Freq: Once | INTRAVENOUS | Status: AC | PRN
  Administered 2023-02-28: 500 [IU]

## 2023-02-28 MED ORDER — ACETAMINOPHEN 325 MG PO TABS
650.0000 mg | ORAL_TABLET | Freq: Once | ORAL | Status: AC
  Administered 2023-02-28: 650 mg via ORAL
  Filled 2023-02-28: qty 2

## 2023-02-28 MED ORDER — SODIUM CHLORIDE 0.9 % IV SOLN: Freq: Once | INTRAVENOUS | Status: AC

## 2023-02-28 MED ORDER — PROCHLORPERAZINE MALEATE 10 MG PO TABS
10.0000 mg | ORAL_TABLET | Freq: Once | ORAL | Status: AC
  Administered 2023-02-28: 10 mg via ORAL
  Filled 2023-02-28: qty 1

## 2023-02-28 MED ORDER — SODIUM CHLORIDE 0.9% FLUSH
10.0000 mL | INTRAVENOUS | Status: DC | PRN
  Administered 2023-02-28: 10 mL

## 2023-02-28 NOTE — Patient Instructions (Signed)
[place] [facility]  Discharge Instructions: Thank you for choosing [facility] to provide your oncology and hematology care.   If you have a lab appointment with the Cancer Center, please go directly to the Cancer Center and check in at the registration area.   Wear comfortable clothing and clothing appropriate for easy access to any Portacath or PICC line.   We strive to give you quality time with your provider. You may need to reschedule your appointment if you arrive late (15 or more minutes).  Arriving late affects you and other patients whose appointments are after yours.  Also, if you miss three or more appointments without notifying the office, you may be dismissed from the clinic at the provider's discretion.      For prescription refill requests, have your pharmacy contact our office and allow 72 hours for refills to be completed.    Today you received the following chemotherapy and/or immunotherapy agents : Belinostat      To help prevent nausea and vomiting after your treatment, we encourage you to take your nausea medication as directed.  BELOW ARE SYMPTOMS THAT SHOULD BE REPORTED IMMEDIATELY: *FEVER GREATER THAN 100.4 F (38 C) OR HIGHER *CHILLS OR SWEATING *NAUSEA AND VOMITING THAT IS NOT CONTROLLED WITH YOUR NAUSEA MEDICATION *UNUSUAL SHORTNESS OF BREATH *UNUSUAL BRUISING OR BLEEDING *URINARY PROBLEMS (pain or burning when urinating, or frequent urination) *BOWEL PROBLEMS (unusual diarrhea, constipation, pain near the anus) TENDERNESS IN MOUTH AND THROAT WITH OR WITHOUT PRESENCE OF ULCERS (sore throat, sores in mouth, or a toothache) UNUSUAL RASH, SWELLING OR PAIN  UNUSUAL VAGINAL DISCHARGE OR ITCHING   Items with * indicate a potential emergency and should be followed up as soon as possible or go to the Emergency Department if any problems should occur.  Please show the CHEMOTHERAPY ALERT CARD or IMMUNOTHERAPY ALERT CARD at  check-in to the Emergency Department and triage nurse.  Should you have questions after your visit or need to cancel or reschedule your appointment, please contact [place] [facility]  Dept: [phone]  and follow the prompts.  Office hours are 8:00 a.m. to 4:30 p.m. Monday - Friday. Please note that voicemails left after 4:00 p.m. may not be returned until the following business day.  We are closed weekends and major holidays. You have access to a nurse at all times for urgent questions. Please call the main number to the clinic Dept: [phone] and follow the prompts.   For any non-urgent questions, you may also contact your provider using MyChart. We now offer e-Visits for anyone 18 and older to request care online for non-urgent symptoms. For details visit [email or web address].   Also download the MyChart app! Go to the app store, search "MyChart", open the app, select [place], and log in with your MyChart username and password.   

## 2023-03-02 ENCOUNTER — Encounter: Payer: Self-pay | Admitting: Hematology

## 2023-03-03 ENCOUNTER — Other Ambulatory Visit: Payer: Self-pay

## 2023-03-03 DIAGNOSIS — C8448 Peripheral T-cell lymphoma, not classified, lymph nodes of multiple sites: Secondary | ICD-10-CM

## 2023-03-03 MED ORDER — NYSTATIN 100000 UNIT/ML MT SUSP
5.0000 mL | Freq: Four times a day (QID) | OROMUCOSAL | 0 refills | Status: DC | PRN
Start: 2023-03-03 — End: 2023-06-27

## 2023-03-05 ENCOUNTER — Telehealth: Payer: Self-pay

## 2023-03-05 ENCOUNTER — Encounter: Payer: Self-pay | Admitting: Hematology

## 2023-03-05 ENCOUNTER — Other Ambulatory Visit: Payer: Self-pay

## 2023-03-05 DIAGNOSIS — C8448 Peripheral T-cell lymphoma, not classified, lymph nodes of multiple sites: Secondary | ICD-10-CM

## 2023-03-05 NOTE — Telephone Encounter (Signed)
Contacted pt regarding MyChart message. Discussed bowel regimen and pt contacting GI as needed. Pt will take colace as needed for constipation and increase fluids. Pt has appt at [facility] on Friday 03/07/23.

## 2023-03-07 ENCOUNTER — Other Ambulatory Visit: Payer: Self-pay

## 2023-03-07 ENCOUNTER — Inpatient Hospital Stay (HOSPITAL_BASED_OUTPATIENT_CLINIC_OR_DEPARTMENT_OTHER): Payer: PPO | Admitting: Hematology

## 2023-03-07 ENCOUNTER — Inpatient Hospital Stay: Payer: PPO

## 2023-03-07 VITALS — BP 146/80 | HR 67 | Temp 98.3°F | Resp 18 | Wt 201.1 lb

## 2023-03-07 DIAGNOSIS — Z95828 Presence of other vascular implants and grafts: Secondary | ICD-10-CM

## 2023-03-07 DIAGNOSIS — C50411 Malignant neoplasm of upper-outer quadrant of right female breast: Secondary | ICD-10-CM

## 2023-03-07 DIAGNOSIS — Z170 Estrogen receptor positive status [ER+]: Secondary | ICD-10-CM

## 2023-03-07 DIAGNOSIS — C8448 Peripheral T-cell lymphoma, not classified, lymph nodes of multiple sites: Secondary | ICD-10-CM

## 2023-03-07 DIAGNOSIS — Z5111 Encounter for antineoplastic chemotherapy: Secondary | ICD-10-CM | POA: Diagnosis not present

## 2023-03-07 DIAGNOSIS — C50412 Malignant neoplasm of upper-outer quadrant of left female breast: Secondary | ICD-10-CM

## 2023-03-07 LAB — CMP (CANCER CENTER ONLY)
ALT: 9 U/L (ref 0–44)
AST: 16 U/L (ref 15–41)
Albumin: 3.6 g/dL (ref 3.5–5.0)
Alkaline Phosphatase: 81 U/L (ref 38–126)
Anion gap: 5 (ref 5–15)
BUN: 13 mg/dL (ref 8–23)
CO2: 27 mmol/L (ref 22–32)
Calcium: 9.3 mg/dL (ref 8.9–10.3)
Chloride: 110 mmol/L (ref 98–111)
Creatinine: 0.92 mg/dL (ref 0.44–1.00)
GFR, Estimated: >60 mL/min (ref >60)
Glucose, Bld: 100 mg/dL — ABNORMAL HIGH (ref 70–99)
Potassium: 3.4 mmol/L — ABNORMAL LOW (ref 3.5–5.1)
Sodium: 142 mmol/L (ref 135–145)
Total Bilirubin: 0.5 mg/dL (ref 0.3–1.2)
Total Protein: 6.4 g/dL — ABNORMAL LOW (ref 6.5–8.1)

## 2023-03-07 LAB — CBC WITH DIFFERENTIAL (CANCER CENTER ONLY)
Abs Immature Granulocytes: 0 10*3/uL (ref 0.00–0.07)
Basophils Absolute: 0 10*3/uL (ref 0.0–0.1)
Basophils Relative: 0 %
Eosinophils Absolute: 0.1 10*3/uL (ref 0.0–0.5)
Eosinophils Relative: 2 %
HCT: 33.8 % — ABNORMAL LOW (ref 36.0–46.0)
Hemoglobin: 11.1 g/dL — ABNORMAL LOW (ref 12.0–15.0)
Immature Granulocytes: 0 %
Lymphocytes Relative: 28 %
Lymphs Abs: 0.9 10*3/uL (ref 0.7–4.0)
MCH: 27 pg (ref 26.0–34.0)
MCHC: 32.8 g/dL (ref 30.0–36.0)
MCV: 82.2 fL (ref 80.0–100.0)
Monocytes Absolute: 0.5 10*3/uL (ref 0.1–1.0)
Monocytes Relative: 15 %
Neutro Abs: 1.7 10*3/uL (ref 1.7–7.7)
Neutrophils Relative %: 55 %
Platelet Count: 168 10*3/uL (ref 150–400)
RBC: 4.11 MIL/uL (ref 3.87–5.11)
RDW: 17.2 % — ABNORMAL HIGH (ref 11.5–15.5)
WBC Count: 3.1 10*3/uL — ABNORMAL LOW (ref 4.0–10.5)
nRBC: 0 % (ref 0.0–0.2)

## 2023-03-07 LAB — LACTATE DEHYDROGENASE: LDH: 217 U/L — ABNORMAL HIGH (ref 98–192)

## 2023-03-07 MED ORDER — SODIUM CHLORIDE 0.9% FLUSH
10.0000 mL | Freq: Once | INTRAVENOUS | Status: AC
  Administered 2023-03-07: 10 mL

## 2023-03-07 MED ORDER — HEPARIN SOD (PORK) LOCK FLUSH 100 UNIT/ML IV SOLN
500.0000 [IU] | Freq: Once | INTRAVENOUS | Status: AC
  Administered 2023-03-07: 500 [IU]

## 2023-03-07 NOTE — Progress Notes (Signed)
[facility]   [phone] [phone]    HEMATOLOGY ONCOLOGY CLINIC VISIT   Patient Care Team: [name], MD as PCP - General (Family Medicine) [name], MD as PCP - Hematology/Oncology (Hematology) [name], MD as Consulting Physician (Gastroenterology) [name], MD as Consulting Physician (Hematology) [name], NP as Nurse Practitioner (Nurse Practitioner) [name], MD as Consulting Physician (Radiation Oncology) [name], MD as Consulting Physician (General Surgery) [name], MD as Attending Physician (Otolaryngology)  Date of Service: 03/07/23    CHIEF COMPLAINT: f/u for evaluation and management of T cell lymphoma.  SUMMARY OF ONCOLOGIC HISTORY: Oncology History Overview Note  Cancer Staging Anal cancer (HCC) Staging form: Anus, AJCC 8th Edition - Clinical stage from 06/28/2019: Stage IIA (cT2, cN0, cM0) - Signed by [name], NP on 06/28/2019  Breast cancer of upper-outer quadrant of left female breast [facility]) Staging form: Breast, AJCC 7th Edition - Clinical stage from 09/07/2013: Stage IIA (T2, N0, cM0) - Signed by [name], MD on 09/07/2013 - Pathologic: Stage IIA (T2, N0, cM0) - Signed by [name], MD on 09/07/2013  Breast cancer of upper-outer quadrant of right female breast [facility]) Staging form: Breast, AJCC 7th Edition - Clinical stage from 09/07/2013: Stage IA (T1a, N0, cM0) - Signed by [name], MD on 09/07/2013 - Pathologic: Stage IA (T1a, N0, cM0) - Signed by [name], MD on 09/07/2013    Breast cancer of upper-outer quadrant of left female breast (HCC)  07/10/2007 Procedure   US biopsy showed invasive ductal carcinoma 0.8cm, Nottingham grade 3   07/30/2007 Surgery   She had left breast lumpectomy and LN biopsy which showed high grade invasive ductal cancer Nottingham grade 3, 2.7 cm, ER/PR/Her2 neu negative   11/24/2007 Surgery   Patient elected for bilateral  mastectomy   09/07/2008 - 03/08/2009 Chemotherapy   dates are approximate: she received adriamycin and cytoxan followed by Taxol   03/08/2009 - 05/08/2009 Radiation Therapy   dates are approximate, she received XRT    07/09/2021 Imaging   CT CAP  IMPRESSION: 1. No evidence to suggest locally recurrent ianorectal neoplasm or metastatic disease in the chest, abdomen or pelvis. 2. Status post bilateral modified radical mastectomy and bilateral axillary lymph node dissection. 3. Three small supraumbilical ventral hernias containing only omental fat. No associated bowel incarceration or obstruction at this time. 4. Aortic atherosclerosis. 5. Additional incidental findings, as above.   Breast cancer of upper-outer quadrant of right female breast (HCC)  07/10/2006 Procedure   stereotactic biopsy showed atypical hyperplasia   10/23/2006 Surgery   right lumpectomy showed invasive ductal ca (Nottingham grade 1)  2mm and DCIS, ER/PR positive Her 2 negative   10/09/2007 - 10/08/2012 Chemotherapy   Patient was placed on Tamoxifen   03/24/2017 Imaging   No acute findings. No evidence of recurrent carcinoma or metastatic disease   07/09/2021 Imaging   CT CAP  IMPRESSION: 1. No evidence to suggest locally recurrent ianorectal neoplasm or metastatic disease in the chest, abdomen or pelvis. 2. Status post bilateral modified radical mastectomy and bilateral axillary lymph node dissection. 3. Three small supraumbilical ventral hernias containing only omental fat. No associated bowel incarceration or obstruction at this time. 4. Aortic atherosclerosis. 5. Additional incidental findings, as above.   Anal cancer (HCC)  06/04/2019 Procedure   Colonoscopy per Dr. [name]: Findings-the digital rectal exam revealed a 3 cm diameter firm rectal mass.  The mass was noncircumferential and located predominantly at  the right bowel wall at the anorectal junction. A nonobstructing mass was found at the  anus and in the rectum    06/04/2019 Initial Biopsy   Follow pathology: Large intestine, rectum biopsy: Invasive well to moderately differentiated squamous cell carcinoma.  No rectal mucosa present.  There is strong diffuse expression of P 16 immunostain.  CDX 2, p63 and mCEA immunostains are also used in the diagnostic work-up of the case.   06/04/2019 Initial Diagnosis   Anal cancer (HCC)   06/21/2019 PET scan   IMPRESSION: 1. Anorectal primary. No hypermetabolic metastatic disease within the chest, abdomen, or pelvis. Perirectal nodes, at least 1 of which is new since 02/26/2018 CT, suspicious based on size and interval development. 2. Mild limitations secondary to beam hardening artifact from bilateral hip arthroplasty. 3.  Aortic Atherosclerosis (ICD10-I70.0).   06/28/2019 Cancer Staging   Staging form: Anus, AJCC 8th Edition - Clinical stage from 06/28/2019: Stage IIA (cT2, cN0, cM0) - Signed by [name], NP on 06/28/2019   06/28/2019 - 07/26/2019 Chemotherapy   concurrent chemoRT with Mitomycin and 5FU on week 1 and week 5 on starting 06/28/19. Last dose on 07/26/19   06/28/2019 - 08/09/2019 Radiation Therapy   concurrent chemoRT with Dr Mitzi Hansen 06/28/19-08/09/19   11/01/2019 PET scan   IMPRESSION: 1. Marked interval decrease in hypermetabolism noted at the level of the anal rectal primary. No evidence for hypermetabolic metastatic disease in the chest, abdomen, or pelvis. The perirectal lymph nodes identified previously have resolved in the interval. 2.  Aortic Atherosclerois (ICD10-170.0)   08/07/2020 Imaging   CT CAP  IMPRESSION: 1. No findings for residual/recurrent anal tumor, regional lymphadenopathy or metastatic disease. 2. Status post cholecystectomy. No biliary dilatation. 3. Small anterior abdominal wall hernia containing fat. 4. Aortic atherosclerosis.   Aortic Atherosclerosis (ICD10-I70.0).     07/09/2021 Imaging   CT CAP  IMPRESSION: 1. No  evidence to suggest locally recurrent ianorectal neoplasm or metastatic disease in the chest, abdomen or pelvis. 2. Status post bilateral modified radical mastectomy and bilateral axillary lymph node dissection. 3. Three small supraumbilical ventral hernias containing only omental fat. No associated bowel incarceration or obstruction at this time. 4. Aortic atherosclerosis. 5. Additional incidental findings, as above.   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 Mosetta Putt for evaluation and management of newly diagnosed T-cell non-Hodgkin's lymphoma.  Patient has a history of bilateral breast cancer status post mastectomies.  -2007: s/p right lumpectomy for ADH in 08/2006 showed DCIS ER/PR+, grade 1. 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 She was adopted, no known family history    She subsequently was diagnosed with anal squamous cell carcinoma cT2 N0 M0 diagnosed in September 2020.Workup showed a 3 cm mass at the anorectal junction, biopsy confirmed well to moderately differentiated squamous cell carcinoma.  06/21/19 PET scan showed no metastasis.  -S/p concurrent chemoRT with Mitomycin and 5FU on 08/09/19. Now on surveillance. -surveillance CT CAP 07/09/21 was NED. -surveillance colonoscopy done for rectal bleeding on 12/07/21  showed only radiation changes. Her rectal bleeding has resolved.  Recently patient presented to her primary care physician on 05/23/2022 with her new right neck lump which has been progressively enlarging.  She also subsequently developed a left neck swelling.  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  She has had a PET CT scan done on 06/19/2022 which showed hypermetabolic bilateral neck lymphadenopathy.  Also noted to have a new 1 cm focus of metabolic activity in segment 4 of the liver.  This is etiology indeterminate. Lumbar scoliosis and small supraumbilical hernia.  Patient has had Port-A-Cath placement done on 06/25/2022. Echocardiogram was done on 06/17/2022 and shows normal ejection fraction of 70 to 75% with grade 1 diastolic dysfunction.  Right ventricular systolic function within normal limits.  INTERVAL HISTORY  Cynthia Velasquez is a 72 y/o female here for evaluation and management of T-cell non-Hodgkin's lymphoma. Patient was last seen by me on 01/31/2023 and complained of frequent fatigue, 3-pound weight loss over the course of 10 days, generalized itchy break-outs, cramping in her anterior and bilateral neck when lying down, lower back pain, fluctuating diarrhea, intermittent night sweats, and possible yeast infection.  Today, she is here for toxicity check after starting Belinostat for T-cell lymphoma. She complains of severe watery diarrhea 4-5 times daily. She began having diarrhea on day 3-4 of treatment. She took two capsules of Imodium and did not have a bowel movement for about a week afterwards. She does note some abdominal discomfort.  She complains of nausea and 1-2 episodes of vomiting daily. She did take anti-nausea medication which did not improve symptoms.   She complains of stiffness in both sides of her neck beginning two days ago. She does note a recently  larger right-sided lymph node in her neck. She denies any other new lumps/bumps, back pain, fever, or chills. She complains of mild night sweats. Patient does experience arthritis flares in her shoulder.   ROS  10 Point review of Systems was done is negative except as noted above.   MEDICAL HISTORY:  Past Medical History:  Diagnosis Date   Anal cancer (HCC) dx'd 05/2019   Anxiety    Arthritis    Bilateral breast cancer (HCC) 10/21/2013   Breast cancer (HCC)    Cancer (HCC)    Depression    Gallstones    GERD (gastroesophageal reflux disease)    doesn't take any meds for this   Headache    h/o migraines       History of bladder infections    History of hiatal hernia    History of migraine    Hyperlipidemia    takes Simvastatin daily   Hypertension    takes Hyzaar   Joint pain    Joint swelling    Lumbar stenosis    Neuropathy 09/07/2013   Pneumonia    Pre-diabetes     SURGICAL HISTORY: Past Surgical History:  Procedure Laterality Date   ABDOMINAL HYSTERECTOMY     bone spur removed from left foot     CHOLECYSTECTOMY     COLONOSCOPY     COLONOSCOPY WITH PROPOFOL Bilateral 12/07/2021   Procedure: COLONOSCOPY WITH PROPOFOL;  Surgeon: Vida Rigger, MD;  Location: [facility];  Service: Endoscopy;  Laterality: Bilateral;   double mastectomy      ESOPHAGOGASTRODUODENOSCOPY N/A 12/07/2021   Procedure: ESOPHAGOGASTRODUODENOSCOPY (EGD);  Surgeon: Vida Rigger, MD;  Location: [facility];  Service: Endoscopy;  Laterality: N/A;   HERNIA REPAIR     umbilical   IR IMAGING GUIDED PORT INSERTION  06/25/2022   right knee arthroscopy     right shoulder arthroscopy     surgery for hiatal hernia     TONSILLECTOMY     TOTAL HIP ARTHROPLASTY Left 02/12/2013   TOTAL HIP ARTHROPLASTY Left 02/12/2013   Procedure: LEFT TOTAL HIP ARTHROPLASTY;  Surgeon: Nestor Lewandowsky, MD;  Location: [facility] OR;  Service: Orthopedics;  Laterality: Left;   TOTAL HIP ARTHROPLASTY Right 05/03/2013   Procedure: TOTAL  HIP ARTHROPLASTY;  Surgeon: Nestor Lewandowsky, MD;  Location: [facility] OR;  Service: Orthopedics;  Laterality: Right;   TOTAL KNEE ARTHROPLASTY Right 02/27/2015   Procedure: TOTAL KNEE ARTHROPLASTY;  Surgeon: Gean Birchwood, MD;  Location: [facility] OR;  Service: Orthopedics;  Laterality: Right;   TOTAL SHOULDER ARTHROPLASTY Right 09/12/2016   Procedure: RIGHT TOTAL SHOULDER ARTHROPLASTY;  Surgeon: Jones Broom, MD;  Location: [facility] OR;  Service: Orthopedics;  Laterality: Right;  RIGHT TOTAL SHOULDER ARTHROPLASTY    I have reviewed the social history and family history with the patient and they are unchanged from previous note.  ALLERGIES:  is allergic to tramadol, belinostat, codeine, and vicodin [hydrocodone-acetaminophen].  MEDICATIONS:  Current Outpatient Medications  Medication Sig Dispense Refill   nystatin (MYCOSTATIN) 100000 UNIT/ML suspension Take 5 mLs (500,000 Units total) by mouth 4 (four) times daily as needed. Swish and swallow 60 mL 0   aspirin-acetaminophen-caffeine (EXCEDRIN MIGRAINE) 250-250-65 MG tablet Take 2 tablets by mouth every 6 (six) hours as needed for migraine.     diclofenac (VOLTAREN) 75 MG EC tablet Take 75 mg by mouth 2 (two) times daily as needed for mild pain.     diphenoxylate-atropine (LOMOTIL) 2.5-0.025 MG tablet Take 2 tablets by mouth 4 (four) times daily as needed for diarrhea or loose stools. 30 tablet 0   DULoxetine (CYMBALTA) 60 MG capsule Take 1 capsule (60 mg total) by mouth 2 (two) times daily. Take 1 capsule (30 mg) at bedtime for 1 week, if no side effects, then increase to 2 capsules (60 mg) thereafter. 60 capsule 11   lidocaine (LMX) 4 % cream Apply topically 2 (two) times daily as needed (pain). 30 g 0   losartan (COZAAR) 100 MG tablet Take 100 mg by mouth daily.  6   Menthol-Methyl Salicylate (SALONPAS PAIN RELIEF PATCH EX) Apply 1 patch topically daily as needed (pain).     methocarbamol (ROBAXIN) 750 MG tablet Take 1 tablet (750 mg total) by mouth every 8  (eight) hours as needed for muscle spasms. 20 tablet 0   Nystatin (GERHARDT'S BUTT CREAM) CREA Apply 1 Application topically 2 (two) times daily. (Patient taking differently: Apply 1 Application topically 2 (two) times daily as needed for irritation.) 30 each 0   nystatin (MYCOSTATIN/NYSTOP) powder Apply 1 Application topically 3 (three) times daily. (Patient taking differently: Apply 1 Application topically 3 (three) times daily as needed (redness, rash).) 15 g 0   ondansetron (ZOFRAN) 8 MG tablet Take 1 tablet (8 mg total) by mouth every 8 (eight) hours as needed for nausea or vomiting. 30 tablet 1   pantoprazole (PROTONIX) 40 MG tablet Take 1 tablet (40 mg total) by mouth daily. (Patient taking differently: Take 40 mg by mouth daily as needed (  for acid reflux).) 30 tablet 0   prochlorperazine (COMPAZINE) 10 MG tablet Take 1 tablet (10 mg total) by mouth every 6 (six) hours as needed for nausea or vomiting. 30 tablet 1   No current facility-administered medications for this visit.    PHYSICAL EXAMINATION: .BP (!) 146/80   Pulse 67   Temp 98.3 F (36.8 C)   Resp 18   Wt 201 lb 1.6 oz (91.2 kg)   SpO2 98%   BMI 33.46 kg/m    GENERAL:alert, in no acute distress and comfortable SKIN: no acute rashes, no significant lesions EYES: conjunctiva are pink and non-injected, sclera anicteric OROPHARYNX: MMM, no exudates, no oropharyngeal erythema or ulceration NECK: supple, no JVD LYMPH:  no palpable lymphadenopathy in the cervical, axillary or inguinal regions LUNGS: clear to auscultation b/l with normal respiratory effort HEART: regular rate & rhythm ABDOMEN:  normoactive bowel sounds , non tender, not distended. Extremity: no pedal edema PSYCH: alert & oriented x 3 with fluent speech NEURO: no focal motor/sensory deficits   LABORATORY DATA:  I have reviewed the data as listed    Latest Ref Rng & Units 03/07/2023    1:46 PM 02/24/2023    2:43 PM 01/31/2023    9:40 AM  CBC  WBC 4.0 -  10.5 K/uL 3.1  3.8  3.5   Hemoglobin 12.0 - 15.0 g/dL 16.1  09.6  04.5   Hematocrit 36.0 - 46.0 % 33.8  35.5  35.7   Platelets 150 - 400 K/uL 168  223  230   .    Latest Ref Rng & Units 03/07/2023    1:46 PM 02/24/2023    2:43 PM 01/31/2023    9:40 AM  CMP  Glucose 70 - 99 mg/dL 409  97  97   BUN 8 - 23 mg/dL 13  15  16    Creatinine 0.44 - 1.00 mg/dL 8.11  9.14  7.82   Sodium 135 - 145 mmol/L 142  140  142   Potassium 3.5 - 5.1 mmol/L 3.4  3.6  3.5   Chloride 98 - 111 mmol/L 110  107  110   CO2 22 - 32 mmol/L 27  27  26    Calcium 8.9 - 10.3 mg/dL 9.3  9.5  9.2   Total Protein 6.5 - 8.1 g/dL 6.4  7.3  7.1   Total Bilirubin 0.3 - 1.2 mg/dL 0.5  0.3  0.4   Alkaline Phos 38 - 126 U/L 81  97  100   AST 15 - 41 U/L 16  16  17    ALT 0 - 44 U/L 9  10  11        Legend: (L) Low (H) High  . Lab Results  Component Value Date   LDH 204 (H) 02/24/2023     01/21/2023 Needle Core Biopsy:           RADIOGRAPHIC STUDIES: .No results found.   ASSESSMENT & PLAN:    Cynthia Velasquez is a 72 y.o.  female with   1. Relapsed 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 Plan -Results of excisional lymph node biopsy showing peripheral T-cell lymphoma not otherwise specified which is CD30 negative were discussed in detail with the patient. PET CT scan was previously reviewed and she appears to have at least stage II disease but depending on the liver lesion etiology this could represent stage IV disease. She has had anthracycline exposure as a part of her AC regimen for previous treatment of  breast cancer and is therefore limited in her total lifetime anthracycline use. CD 30 negative status precludes use of Adcentris.  2. Anal Squamous Cell Carcinoma, cT2N0M0-currently in remission.  Details as noted above   3. 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 Completed tamoxifen 09/2007 -  09/2012 -2008: stage pT2N0 left breast invasive ductal carcinoma, triple negative, grade 3; s/p lumpectomy in 07/2007 and bilateral mastectomy 11/2007 (path negative for residual carcinoma in both breasts); s/p adjuvant chemo AC-T and radiation  PLAN:  -Discussed lab results on 03/07/2023 in detail with patient. CBC stable, showed WBC of 3.1K, hemoglobin of 11.1, and platelets of 168K. -Continue Belinostat treatment at current dose -discussed option of providing a stool sample to rule out concern of an infection -discussed option of long acting Sandostatin injections once a month to address frequent diarrhea, which patient is agreeable to try at this time -Patient shall continue to follow-up with GI to address her chronic diarrhea and usual bowel movements -patient shall follow with PCP to evaluate arthritis flares in her shoulder -will repeat PET scan after 3 cycles of treatment  FOLLOW UP: Plz schedule C2 and C3 of Belinostat per orders  Portflush and labs and MD visit with C2D1 and C3D1.  The total time spent in the appointment was 21 minutes* .  All of the patient's questions were answered with apparent satisfaction. The patient knows to call the clinic with any problems, questions or concerns.   Wyvonnia Lora MD MS AAHIVMS [facility] Hematology/Oncology Physician [facility]  .*Total Encounter Time as defined by the Centers for Medicare and Medicaid Services includes, in addition to the face-to-face time of a patient visit (documented in the note above) non-face-to-face time: obtaining and reviewing outside history, ordering and reviewing medications, tests or procedures, care coordination (communications with other health care professionals or caregivers) and documentation in the medical record.    I,Mitra Faeizi,acting as a Neurosurgeon for Wyvonnia Lora, MD.,have documented all relevant documentation on the behalf of Wyvonnia Lora, MD,as directed by  Wyvonnia Lora, MD while in the  presence of Wyvonnia Lora, MD.  .I have reviewed the above documentation for accuracy and completeness, and I agree with the above. [name] MD

## 2023-03-10 ENCOUNTER — Encounter: Payer: Self-pay | Admitting: Hematology

## 2023-03-13 ENCOUNTER — Encounter: Payer: Self-pay | Admitting: Hematology

## 2023-03-15 ENCOUNTER — Other Ambulatory Visit: Payer: Self-pay

## 2023-03-21 DIAGNOSIS — M67911 Unspecified disorder of synovium and tendon, right shoulder: Secondary | ICD-10-CM | POA: Diagnosis not present

## 2023-03-21 DIAGNOSIS — M19012 Primary osteoarthritis, left shoulder: Secondary | ICD-10-CM | POA: Diagnosis not present

## 2023-03-24 ENCOUNTER — Ambulatory Visit: Payer: PRIVATE HEALTH INSURANCE

## 2023-03-24 ENCOUNTER — Inpatient Hospital Stay: Payer: PPO | Attending: Hematology

## 2023-03-24 ENCOUNTER — Inpatient Hospital Stay: Payer: PPO

## 2023-03-24 ENCOUNTER — Ambulatory Visit: Payer: PRIVATE HEALTH INSURANCE | Admitting: Hematology

## 2023-03-24 ENCOUNTER — Other Ambulatory Visit: Payer: Self-pay

## 2023-03-24 ENCOUNTER — Inpatient Hospital Stay (HOSPITAL_BASED_OUTPATIENT_CLINIC_OR_DEPARTMENT_OTHER): Payer: PPO | Admitting: Hematology

## 2023-03-24 ENCOUNTER — Other Ambulatory Visit: Payer: PRIVATE HEALTH INSURANCE

## 2023-03-24 VITALS — BP 155/77 | HR 66 | Temp 97.5°F | Resp 18 | Wt 198.0 lb

## 2023-03-24 VITALS — BP 117/62 | HR 75 | Temp 98.7°F | Resp 16

## 2023-03-24 DIAGNOSIS — C50411 Malignant neoplasm of upper-outer quadrant of right female breast: Secondary | ICD-10-CM

## 2023-03-24 DIAGNOSIS — Z79899 Other long term (current) drug therapy: Secondary | ICD-10-CM | POA: Insufficient documentation

## 2023-03-24 DIAGNOSIS — C8448 Peripheral T-cell lymphoma, not classified, lymph nodes of multiple sites: Secondary | ICD-10-CM | POA: Insufficient documentation

## 2023-03-24 DIAGNOSIS — Z95828 Presence of other vascular implants and grafts: Secondary | ICD-10-CM

## 2023-03-24 DIAGNOSIS — Z5111 Encounter for antineoplastic chemotherapy: Secondary | ICD-10-CM | POA: Diagnosis not present

## 2023-03-24 DIAGNOSIS — C50412 Malignant neoplasm of upper-outer quadrant of left female breast: Secondary | ICD-10-CM

## 2023-03-24 LAB — CMP (CANCER CENTER ONLY)
ALT: 14 U/L (ref 0–44)
AST: 19 U/L (ref 15–41)
Albumin: 3.7 g/dL (ref 3.5–5.0)
Alkaline Phosphatase: 98 U/L (ref 38–126)
Anion gap: 6 (ref 5–15)
BUN: 20 mg/dL (ref 8–23)
CO2: 27 mmol/L (ref 22–32)
Calcium: 9.2 mg/dL (ref 8.9–10.3)
Chloride: 109 mmol/L (ref 98–111)
Creatinine: 1.21 mg/dL — ABNORMAL HIGH (ref 0.44–1.00)
GFR, Estimated: 48 mL/min — ABNORMAL LOW (ref >60)
Glucose, Bld: 74 mg/dL (ref 70–99)
Potassium: 3.7 mmol/L (ref 3.5–5.1)
Sodium: 142 mmol/L (ref 135–145)
Total Bilirubin: 0.4 mg/dL (ref 0.3–1.2)
Total Protein: 7.1 g/dL (ref 6.5–8.1)

## 2023-03-24 LAB — CBC WITH DIFFERENTIAL (CANCER CENTER ONLY)
Abs Immature Granulocytes: 0 10*3/uL (ref 0.00–0.07)
Basophils Absolute: 0 10*3/uL (ref 0.0–0.1)
Basophils Relative: 0 %
Eosinophils Absolute: 0 10*3/uL (ref 0.0–0.5)
Eosinophils Relative: 1 %
HCT: 35.9 % — ABNORMAL LOW (ref 36.0–46.0)
Hemoglobin: 11.3 g/dL — ABNORMAL LOW (ref 12.0–15.0)
Immature Granulocytes: 0 %
Lymphocytes Relative: 28 %
Lymphs Abs: 0.9 10*3/uL (ref 0.7–4.0)
MCH: 25.7 pg — ABNORMAL LOW (ref 26.0–34.0)
MCHC: 31.5 g/dL (ref 30.0–36.0)
MCV: 81.6 fL (ref 80.0–100.0)
Monocytes Absolute: 0.5 10*3/uL (ref 0.1–1.0)
Monocytes Relative: 14 %
Neutro Abs: 1.9 10*3/uL (ref 1.7–7.7)
Neutrophils Relative %: 57 %
Platelet Count: 172 10*3/uL (ref 150–400)
RBC: 4.4 MIL/uL (ref 3.87–5.11)
RDW: 17.2 % — ABNORMAL HIGH (ref 11.5–15.5)
WBC Count: 3.3 10*3/uL — ABNORMAL LOW (ref 4.0–10.5)
nRBC: 0 % (ref 0.0–0.2)

## 2023-03-24 MED ORDER — SODIUM CHLORIDE 0.9 % IV SOLN
1000.0000 mg/m2 | Freq: Once | INTRAVENOUS | Status: AC
  Administered 2023-03-24: 2000 mg via INTRAVENOUS
  Filled 2023-03-24: qty 40

## 2023-03-24 MED ORDER — SODIUM CHLORIDE 0.9% FLUSH
10.0000 mL | INTRAVENOUS | Status: DC | PRN
  Administered 2023-03-24: 10 mL

## 2023-03-24 MED ORDER — HEPARIN SOD (PORK) LOCK FLUSH 100 UNIT/ML IV SOLN
500.0000 [IU] | Freq: Once | INTRAVENOUS | Status: AC | PRN
  Administered 2023-03-24: 500 [IU]

## 2023-03-24 MED ORDER — ACETAMINOPHEN 325 MG PO TABS
650.0000 mg | ORAL_TABLET | Freq: Once | ORAL | Status: AC
  Administered 2023-03-24: 650 mg via ORAL
  Filled 2023-03-24: qty 2

## 2023-03-24 MED ORDER — PROCHLORPERAZINE MALEATE 10 MG PO TABS
10.0000 mg | ORAL_TABLET | Freq: Once | ORAL | Status: AC
  Administered 2023-03-24: 10 mg via ORAL
  Filled 2023-03-24: qty 1

## 2023-03-24 MED ORDER — LORAZEPAM 1 MG PO TABS
0.5000 mg | ORAL_TABLET | Freq: Once | ORAL | Status: AC
  Administered 2023-03-24: 0.5 mg via ORAL
  Filled 2023-03-24: qty 1

## 2023-03-24 MED ORDER — SODIUM CHLORIDE 0.9 % IV SOLN: Freq: Once | INTRAVENOUS | Status: AC

## 2023-03-24 MED ORDER — SODIUM CHLORIDE 0.9% FLUSH
10.0000 mL | Freq: Once | INTRAVENOUS | Status: AC
  Administered 2023-03-24: 10 mL

## 2023-03-24 NOTE — Progress Notes (Signed)
At pt request, will infuse Belinostat over 2 hours.   Ebony Hail, Pharm.D., CPP 03/24/2023@3 :09 PM

## 2023-03-24 NOTE — Patient Instructions (Signed)
[facility]  Discharge Instructions: Thank you for choosing [facility] to provide your oncology and hematology care.   If you have a lab appointment with the Cancer Center, please go directly to the Cancer Center and check in at the registration area.   Wear comfortable clothing and clothing appropriate for easy access to any Portacath or PICC line.   We strive to give you quality time with your provider. You may need to reschedule your appointment if you arrive late (15 or more minutes).  Arriving late affects you and other patients whose appointments are after yours.  Also, if you miss three or more appointments without notifying the office, you may be dismissed from the clinic at the provider's discretion.      For prescription refill requests, have your pharmacy contact our office and allow 72 hours for refills to be completed.    Today you received the following chemotherapy and/or immunotherapy agent: Belinostat   To help prevent nausea and vomiting after your treatment, we encourage you to take your nausea medication as directed.  BELOW ARE SYMPTOMS THAT SHOULD BE REPORTED IMMEDIATELY: *FEVER GREATER THAN 100.4 F (38 C) OR HIGHER *CHILLS OR SWEATING *NAUSEA AND VOMITING THAT IS NOT CONTROLLED WITH YOUR NAUSEA MEDICATION *UNUSUAL SHORTNESS OF BREATH *UNUSUAL BRUISING OR BLEEDING *URINARY PROBLEMS (pain or burning when urinating, or frequent urination) *BOWEL PROBLEMS (unusual diarrhea, constipation, pain near the anus) TENDERNESS IN MOUTH AND THROAT WITH OR WITHOUT PRESENCE OF ULCERS (sore throat, sores in mouth, or a toothache) UNUSUAL RASH, SWELLING OR PAIN  UNUSUAL VAGINAL DISCHARGE OR ITCHING   Items with * indicate a potential emergency and should be followed up as soon as possible or go to the Emergency Department if any problems should occur.  Please show the CHEMOTHERAPY ALERT CARD or IMMUNOTHERAPY ALERT CARD at check-in  to the Emergency Department and triage nurse.  Should you have questions after your visit or need to cancel or reschedule your appointment, please contact [facility]  Dept: [phone]  and follow the prompts.  Office hours are 8:00 a.m. to 4:30 p.m. Monday - Friday. Please note that voicemails left after 4:00 p.m. may not be returned until the following business day.  We are closed weekends and major holidays. You have access to a nurse at all times for urgent questions. Please call the main number to the clinic Dept: [phone] and follow the prompts.   For any non-urgent questions, you may also contact your provider using MyChart. We now offer e-Visits for anyone 66 and older to request care online for non-urgent symptoms. For details visit [email or web address].   Also download the MyChart app! Go to the app store, search "MyChart", open the app, select [place], and log in with your MyChart username and password.  Belinostat Injection What is this medication? BELINOSTAT (beh LIH noh STAT) treats lymphoma. It works by slowing down the growth of cancer cells. This medicine may be used for other purposes; ask your health care provider or pharmacist if you have questions. COMMON BRAND NAME(S): Beleodaq What should I tell my care team before I take this medication? They need to know if you have any of these conditions: Carry the UGT1A1*28 gene Infection Kidney disease Liver disease Low blood cell levels (white cells, platelets, or red blood cells) An unusual or allergic reaction to belinostat, other medications, foods, dyes, or preservatives If you or your partner are pregnant  or trying to get pregnant Breast-feeding How should I use this medication? This medication is infused into a vein. It is given by your care team in a hospital or clinic setting. Talk to your care team about the use of this medication in children. Special care may be  needed. Overdosage: If you think you have taken too much of this medicine contact a poison control center or emergency room at once. NOTE: This medicine is only for you. Do not share this medicine with others. What if I miss a dose? Keep appointments for follow-up doses. It is important not to miss your dose. Call your care team if you are unable to keep an appointment. What may interact with this medication? Do not take this medication with any of the following: Atazanavir This medication may affect how other medications work, and other medications may affect the way this medication works. Talk with your care team about all of the medications you take. They may suggest changes to your treatment plan to lower the risk of side effects and to make sure your medications work as intended. This list may not describe all possible interactions. Give your health care provider a list of all the medicines, herbs, non-prescription drugs, or dietary supplements you use. Also tell them if you smoke, drink alcohol, or use illegal drugs. Some items may interact with your medicine. What should I watch for while using this medication? Your condition will be monitored carefully while you are receiving this medication. This medication may make you feel generally unwell. This is not uncommon as chemotherapy can affect healthy cells as well as cancer cells. Report any side effects. Continue your course of treatment even though you feel ill unless your care team tells you to stop. You may need blood work while taking this medication. This medication may increase your risk to bruise or bleed. Call your care team if you notice any unusual bleeding. This medication may increase your risk of getting an infection. Call your care team for advice if you get a fever, chills, sore throat, or other symptoms of a cold or flu. Do not treat yourself. Try to avoid being around people who are sick. Call your care team if you are around  anyone with measles, chickenpox, or if you develop sores or blisters that do not heal properly. Be careful brushing or flossing your teeth or using a toothpick because you may get an infection or bleed more easily. If you have any dental work done, tell your dentist you are receiving this medication. Talk to your care team if you or your partner wish to become pregnant or think either of you might be pregnant. This medication can cause serious birth defects if taken during pregnancy or for 6 months after stopping treatment. A reliable form of contraception is recommended while taking this medication and for 6 months after stopping treatment. Talk to your care team about effective forms of contraception. Use a condom during sex and for 3 months after stopping treatment. Tell your care team right away if you think your partner might be pregnant. This medication can cause serious birth defects. This medication may cause infertility. Talk to your care team if you are concerned about your fertility. Do not breast-feed while taking this medication and for 2 weeks after stopping therapy. What side effects may I notice from receiving this medication? Side effects that you should report to your care team as soon as possible: Allergic reactions--skin rash, itching, hives, swelling of the  face, lips, tongue, or throat Infection--fever, chills, cough, sore throat, wounds that don't heal, pain or trouble when passing urine, general feeling of discomfort or being unwell Liver injury--right upper belly pain, loss of appetite, nausea, light-colored stool, dark yellow or brown urine, yellowing skin or eyes, unusual weakness or fatigue Low red blood cell level--unusual weakness or fatigue, dizziness, headache, trouble breathing Tumor lysis syndrome (TLS)--nausea, vomiting, diarrhea, decrease in the amount of urine, dark urine, unusual weakness or fatigue, confusion, muscle pain or cramps, fast or irregular heartbeat,  joint pain Unusual bruising or bleeding Side effects that usually do not require medical attention (report to your care team if they continue or are bothersome): Diarrhea Fatigue Nausea Vomiting This list may not describe all possible side effects. Call your doctor for medical advice about side effects. You may report side effects to FDA at 1-800-FDA-1088. Where should I keep my medication? This medication is given in a hospital or clinic. It will not be stored at home. NOTE: This sheet is a summary. It may not cover all possible information. If you have questions about this medicine, talk to your doctor, pharmacist, or health care provider.  2024 [employer]vier/Gold Standard (2021-11-28 00:00:00)

## 2023-03-24 NOTE — Progress Notes (Signed)
Patient seen by Dr. Kale  Vitals are within treatment parameters.  Labs reviewed: and are within treatment parameters.  Per physician team, patient is ready for treatment and there are NO modifications to the treatment plan.  

## 2023-03-24 NOTE — Progress Notes (Signed)
[facility]   Telephone:([phone]) [phone] [phone]    HEMATOLOGY ONCOLOGY CLINIC VISIT   Patient Care Team: [name], MD as PCP - General (Family Medicine) [name], MD as PCP - Hematology/Oncology (Hematology) [name], MD as Consulting Physician (Gastroenterology) [name], MD as Consulting Physician (Hematology) [name], NP as Nurse Practitioner (Nurse Practitioner) [name], MD as Consulting Physician (Radiation Oncology) [name], MD as Consulting Physician (General Surgery) [name], MD as Attending Physician (Otolaryngology)  Date of Service: 03/24/23    CHIEF COMPLAINT: f/u for evaluation and management of T cell lymphoma.  SUMMARY OF ONCOLOGIC HISTORY: Oncology History Overview Note  Cancer Staging Anal cancer (HCC) Staging form: Anus, AJCC 8th Edition - Clinical stage from 06/28/2019: Stage IIA (cT2, cN0, cM0) - Signed by [name], NP on 06/28/2019  Breast cancer of upper-outer quadrant of left female breast [facility]) Staging form: Breast, AJCC 7th Edition - Clinical stage from 09/07/2013: Stage IIA (T2, N0, cM0) - Signed by [name], MD on 09/07/2013 - Pathologic: Stage IIA (T2, N0, cM0) - Signed by [name], MD on 09/07/2013  Breast cancer of upper-outer quadrant of right female breast [facility]) Staging form: Breast, AJCC 7th Edition - Clinical stage from 09/07/2013: Stage IA (T1a, N0, cM0) - Signed by [name], MD on 09/07/2013 - Pathologic: Stage IA (T1a, N0, cM0) - Signed by [name], MD on 09/07/2013    Breast cancer of upper-outer quadrant of left female breast (HCC)  07/10/2007 Procedure   US biopsy showed invasive ductal carcinoma 0.8cm, Nottingham grade 3   07/30/2007 Surgery   She had left breast lumpectomy and LN biopsy which showed high grade invasive ductal cancer Nottingham grade 3, 2.7 cm, ER/PR/Her2 neu negative   11/24/2007 Surgery   Patient elected for bilateral  mastectomy   09/07/2008 - 03/08/2009 Chemotherapy   dates are approximate: she received adriamycin and cytoxan followed by Taxol   03/08/2009 - 05/08/2009 Radiation Therapy   dates are approximate, she received XRT    07/09/2021 Imaging   CT CAP  IMPRESSION: 1. No evidence to suggest locally recurrent ianorectal neoplasm or metastatic disease in the chest, abdomen or pelvis. 2. Status post bilateral modified radical mastectomy and bilateral axillary lymph node dissection. 3. Three small supraumbilical ventral hernias containing only omental fat. No associated bowel incarceration or obstruction at this time. 4. Aortic atherosclerosis. 5. Additional incidental findings, as above.   Breast cancer of upper-outer quadrant of right female breast (HCC)  07/10/2006 Procedure   stereotactic biopsy showed atypical hyperplasia   10/23/2006 Surgery   right lumpectomy showed invasive ductal ca (Nottingham grade 1)  2mm and DCIS, ER/PR positive Her 2 negative   10/09/2007 - 10/08/2012 Chemotherapy   Patient was placed on Tamoxifen   03/24/2017 Imaging   No acute findings. No evidence of recurrent carcinoma or metastatic disease   07/09/2021 Imaging   CT CAP  IMPRESSION: 1. No evidence to suggest locally recurrent ianorectal neoplasm or metastatic disease in the chest, abdomen or pelvis. 2. Status post bilateral modified radical mastectomy and bilateral axillary lymph node dissection. 3. Three small supraumbilical ventral hernias containing only omental fat. No associated bowel incarceration or obstruction at this time. 4. Aortic atherosclerosis. 5. Additional incidental findings, as above.   Anal cancer (HCC)  06/04/2019 Procedure   Colonoscopy per Dr. [name]: Findings-the digital rectal exam revealed a 3 cm diameter firm rectal mass.  The mass was noncircumferential and located predominantly at  the right bowel wall at the anorectal junction. A nonobstructing mass was found at the  anus and in the rectum    06/04/2019 Initial Biopsy   Follow pathology: Large intestine, rectum biopsy: Invasive well to moderately differentiated squamous cell carcinoma.  No rectal mucosa present.  There is strong diffuse expression of P 16 immunostain.  CDX 2, p63 and mCEA immunostains are also used in the diagnostic work-up of the case.   06/04/2019 Initial Diagnosis   Anal cancer (HCC)   06/21/2019 PET scan   IMPRESSION: 1. Anorectal primary. No hypermetabolic metastatic disease within the chest, abdomen, or pelvis. Perirectal nodes, at least 1 of which is new since 02/26/2018 CT, suspicious based on size and interval development. 2. Mild limitations secondary to beam hardening artifact from bilateral hip arthroplasty. 3.  Aortic Atherosclerosis (ICD10-I70.0).   06/28/2019 Cancer Staging   Staging form: Anus, AJCC 8th Edition - Clinical stage from 06/28/2019: Stage IIA (cT2, cN0, cM0) - Signed by [name], NP on 06/28/2019   06/28/2019 - 07/26/2019 Chemotherapy   concurrent chemoRT with Mitomycin and 5FU on week 1 and week 5 on starting 06/28/19. Last dose on 07/26/19   06/28/2019 - 08/09/2019 Radiation Therapy   concurrent chemoRT with Dr Mitzi Hansen 06/28/19-08/09/19   11/01/2019 PET scan   IMPRESSION: 1. Marked interval decrease in hypermetabolism noted at the level of the anal rectal primary. No evidence for hypermetabolic metastatic disease in the chest, abdomen, or pelvis. The perirectal lymph nodes identified previously have resolved in the interval. 2.  Aortic Atherosclerois (ICD10-170.0)   08/07/2020 Imaging   CT CAP  IMPRESSION: 1. No findings for residual/recurrent anal tumor, regional lymphadenopathy or metastatic disease. 2. Status post cholecystectomy. No biliary dilatation. 3. Small anterior abdominal wall hernia containing fat. 4. Aortic atherosclerosis.   Aortic Atherosclerosis (ICD10-I70.0).     07/09/2021 Imaging   CT CAP  IMPRESSION: 1. No  evidence to suggest locally recurrent ianorectal neoplasm or metastatic disease in the chest, abdomen or pelvis. 2. Status post bilateral modified radical mastectomy and bilateral axillary lymph node dissection. 3. Three small supraumbilical ventral hernias containing only omental fat. No associated bowel incarceration or obstruction at this time. 4. Aortic atherosclerosis. 5. Additional incidental findings, as above.   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 Mosetta Putt for evaluation and management of newly diagnosed T-cell non-Hodgkin's lymphoma.  Patient has a history of bilateral breast cancer status post mastectomies.  -2007: s/p right lumpectomy for ADH in 08/2006 showed DCIS ER/PR+, grade 1. 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 She was adopted, no known family history    She subsequently was diagnosed with anal squamous cell carcinoma cT2 N0 M0 diagnosed in September 2020.Workup showed a 3 cm mass at the anorectal junction, biopsy confirmed well to moderately differentiated squamous cell carcinoma.  06/21/19 PET scan showed no metastasis.  -S/p concurrent chemoRT with Mitomycin and 5FU on 08/09/19. Now on surveillance. -surveillance CT CAP 07/09/21 was NED. -surveillance colonoscopy done for rectal bleeding on 12/07/21  showed only radiation changes. Her rectal bleeding has resolved.  Recently patient presented to her primary care physician on 05/23/2022 with her new right neck lump which has been progressively enlarging.  She also subsequently developed a left neck swelling.  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  She has had a PET CT scan done on 06/19/2022 which showed hypermetabolic bilateral neck lymphadenopathy.  Also noted to have a new 1 cm focus of metabolic activity in segment 4 of the liver.  This is etiology indeterminate. Lumbar scoliosis and small supraumbilical hernia.  Patient has had Port-A-Cath placement done on 06/25/2022. Echocardiogram was done on 06/17/2022 and shows normal ejection fraction of 70 to 75% with grade 1 diastolic dysfunction.  Right ventricular systolic function within normal limits.  INTERVAL HISTORY  Gazella Anglin is a 72 y/o female here for evaluation and management of T-cell non-Hodgkin's lymphoma. She is here for cycle 2 day 1 of her belinostat treatment during this visit.   Patient was last seen by me on 03/07/2023 and she complained of diarrhea, mild nausea, neck stiffness, night sweat, and arthritis flares in her shoulder.   Patient notes she has been doing well overall since our last visit. She does complain of diarrhea, around 4 times a day. However, she denies diarrhea for the past 2 weeks. She took two capsules of Imodium around 2 weeks ago, which resolved her diarrhea but also made her constipated.   She does complain of right-sided enlarged lymph node, which has not changed in size.   She denies any new infection issues, fever, chills, night sweats, nausea, back pain, abdominal pain, chest pain, or leg swelling.   Patient notes she has been eating well overall.   She is planning on travelling to [place] from July 24th to August 1st.    ROS  10 Point review of Systems was done is negative except as noted above.   MEDICAL HISTORY:  Past Medical History:  Diagnosis Date   Anal cancer (HCC) dx'd 05/2019   Anxiety    Arthritis    Bilateral breast cancer (HCC) 10/21/2013   Breast cancer (HCC)    Cancer (HCC)    Depression    Gallstones    GERD (gastroesophageal reflux disease)    doesn't take any meds for this   Headache    h/o migraines       History of bladder infections    History of hiatal hernia    History of migraine    Hyperlipidemia    takes Simvastatin daily   Hypertension    takes Hyzaar   Joint pain    Joint swelling    Lumbar stenosis    Neuropathy 09/07/2013   Pneumonia    Pre-diabetes     SURGICAL HISTORY: Past Surgical History:  Procedure Laterality Date   ABDOMINAL HYSTERECTOMY     bone spur removed from left foot     CHOLECYSTECTOMY     COLONOSCOPY     COLONOSCOPY WITH PROPOFOL Bilateral 12/07/2021   Procedure: COLONOSCOPY WITH PROPOFOL;  Surgeon: Vida Rigger, MD;  Location: [facility];  Service: Endoscopy;  Laterality: Bilateral;   double mastectomy      ESOPHAGOGASTRODUODENOSCOPY N/A 12/07/2021   Procedure:  ESOPHAGOGASTRODUODENOSCOPY (EGD);  Surgeon: Vida Rigger, MD;  Location: [facility];  Service: Endoscopy;  Laterality: N/A;   HERNIA REPAIR     umbilical   IR IMAGING GUIDED PORT INSERTION  06/25/2022   right knee arthroscopy     right shoulder arthroscopy     surgery for hiatal hernia     TONSILLECTOMY     TOTAL HIP ARTHROPLASTY Left 02/12/2013   TOTAL HIP ARTHROPLASTY Left 02/12/2013   Procedure: LEFT TOTAL HIP ARTHROPLASTY;  Surgeon: Nestor Lewandowsky, MD;  Location: [facility] OR;  Service: Orthopedics;  Laterality: Left;   TOTAL HIP ARTHROPLASTY Right 05/03/2013   Procedure: TOTAL HIP ARTHROPLASTY;  Surgeon: Nestor Lewandowsky, MD;  Location: [facility] OR;  Service: Orthopedics;  Laterality: Right;   TOTAL KNEE ARTHROPLASTY Right 02/27/2015   Procedure: TOTAL KNEE ARTHROPLASTY;  Surgeon: Gean Birchwood, MD;  Location: [facility] OR;  Service: Orthopedics;  Laterality: Right;   TOTAL SHOULDER ARTHROPLASTY Right 09/12/2016   Procedure: RIGHT TOTAL SHOULDER ARTHROPLASTY;  Surgeon: Jones Broom, MD;  Location: [facility] OR;  Service: Orthopedics;  Laterality: Right;  RIGHT TOTAL SHOULDER ARTHROPLASTY    I have reviewed the social history and family history with the patient and they are unchanged from previous note.  ALLERGIES:  is allergic to tramadol, belinostat, codeine, and vicodin [hydrocodone-acetaminophen].  MEDICATIONS:  Current Outpatient Medications  Medication Sig Dispense Refill   nystatin (MYCOSTATIN) 100000 UNIT/ML suspension Take 5 mLs (500,000 Units total) by mouth 4 (four) times daily as needed. Swish and swallow 60 mL 0   aspirin-acetaminophen-caffeine (EXCEDRIN MIGRAINE) 250-250-65 MG tablet Take 2 tablets by mouth every 6 (six) hours as needed for migraine.     diclofenac (VOLTAREN) 75 MG EC tablet Take 75 mg by mouth 2 (two) times daily as needed for mild pain.     diphenoxylate-atropine (LOMOTIL) 2.5-0.025 MG tablet Take 2 tablets by mouth 4 (four) times daily as needed for diarrhea or loose stools. 30 tablet 0   DULoxetine (CYMBALTA) 60 MG capsule Take 1 capsule (60 mg total) by mouth 2 (two) times daily. Take 1 capsule (30 mg) at bedtime for 1 week, if no side effects, then increase to 2 capsules (60 mg) thereafter. 60 capsule 11   lidocaine (LMX) 4 % cream Apply topically 2 (two) times daily as needed (pain). 30 g 0   losartan (COZAAR) 100 MG tablet Take 100 mg by mouth daily.  6   Menthol-Methyl Salicylate (SALONPAS PAIN RELIEF PATCH EX) Apply 1 patch topically daily as needed (pain).     methocarbamol (ROBAXIN) 750 MG tablet Take 1 tablet (750 mg total) by mouth every 8 (eight) hours as needed for muscle spasms. 20 tablet 0   Nystatin (GERHARDT'S BUTT CREAM) CREA Apply 1 Application topically 2 (two) times daily. (Patient taking differently: Apply 1 Application topically 2  (two) times daily as needed for irritation.) 30 each 0   nystatin (MYCOSTATIN/NYSTOP) powder Apply 1 Application topically 3 (three) times daily. (Patient taking differently: Apply 1 Application topically 3 (three) times daily as needed (redness, rash).) 15 g 0   ondansetron (ZOFRAN) 8 MG tablet Take 1 tablet (8 mg total) by mouth every 8 (eight) hours as needed for nausea or vomiting. 30 tablet 1   pantoprazole (PROTONIX) 40 MG tablet Take 1 tablet (40 mg total) by mouth daily. (Patient taking differently: Take 40 mg by mouth daily as needed (for acid reflux).) 30 tablet 0   prochlorperazine (COMPAZINE) 10 MG tablet Take 1 tablet (10 mg total) by  mouth every 6 (six) hours as needed for nausea or vomiting. 30 tablet 1   No current facility-administered medications for this visit.    PHYSICAL EXAMINATION: .BP (!) 155/77   Pulse 66   Temp (!) 97.5 F (36.4 C)   Resp 18   Wt 198 lb (89.8 kg)   SpO2 100%   BMI 32.95 kg/m  GENERAL:alert, in no acute distress and comfortable SKIN: no acute rashes, no significant lesions EYES: conjunctiva are pink and non-injected, sclera anicteric OROPHARYNX: MMM, no exudates, no oropharyngeal erythema or ulceration NECK: supple, no JVD LYMPH:  no palpable lymphadenopathy in the cervical, axillary or inguinal regions LUNGS: clear to auscultation b/l with normal respiratory effort HEART: regular rate & rhythm ABDOMEN:  normoactive bowel sounds , non tender, not distended. Extremity: no pedal edema PSYCH: alert & oriented x 3 with fluent speech NEURO: no focal motor/sensory deficits   LABORATORY DATA:  I have reviewed the data as listed    Latest Ref Rng & Units 03/24/2023   12:10 PM 03/07/2023    1:46 PM  CBC  WBC 4.0 - 10.5 K/uL 3.3  3.1   Hemoglobin 12.0 - 15.0 g/dL 84.6  96.2   Hematocrit 36.0 - 46.0 % 35.9  33.8   Platelets 150 - 400 K/uL 172  168   .    Latest Ref Rng & Units 03/24/2023   12:10 PM 03/07/2023    1:46 PM  CMP  Glucose 70 - 99  mg/dL 74  952   BUN 8 - 23 mg/dL 20  13   Creatinine 8.41 - 1.00 mg/dL 3.24  4.01   Sodium 027 - 145 mmol/L 142  142   Potassium 3.5 - 5.1 mmol/L 3.7  3.4   Chloride 98 - 111 mmol/L 109  110   CO2 22 - 32 mmol/L 27  27   Calcium 8.9 - 10.3 mg/dL 9.2  9.3   Total Protein 6.5 - 8.1 g/dL 7.1  6.4   Total Bilirubin 0.3 - 1.2 mg/dL 0.4  0.5   Alkaline Phos 38 - 126 U/L 98  81   AST 15 - 41 U/L 19  16   ALT 0 - 44 U/L 14  9       Legend: (L) Low (H) High  . Lab Results  Component Value Date   LDH 217 (H) 03/07/2023     01/21/2023 Needle Core Biopsy:           RADIOGRAPHIC STUDIES: .No results found.   ASSESSMENT & PLAN:    Cynthia Velasquez is a 72 y.o.  female with   1. Relapsed 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 Plan -Results of excisional lymph node biopsy showing peripheral T-cell lymphoma not otherwise specified which is CD30 negative were discussed in detail with the patient. PET CT scan was previously reviewed and she appears to have at least stage II disease but depending on the liver lesion etiology this could represent stage IV disease. She has had anthracycline exposure as a part of her AC regimen for previous treatment of breast cancer and is therefore limited in her total lifetime anthracycline use. CD 30 negative status precludes use of Adcentris.  2. Anal Squamous Cell Carcinoma, cT2N0M0-currently in remission.  Details as noted above   3. 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 Completed tamoxifen 09/2007 - 09/2012 -2008: stage pT2N0 left breast invasive ductal carcinoma, triple  negative, grade 3; s/p lumpectomy in 07/2007 and bilateral mastectomy 11/2007 (path negative for residual carcinoma in both breasts); s/p adjuvant chemo AC-T and radiation  PLAN: -Discussed lab results from today, 03/24/2023, with the patient. CBC shows WBC of 3.3 K, hemoglobin  at 11.3 g/dL, hematocrit at 23.7%, and platelet at 172 K. CMP shows slightly elevated creatinine at 1.21, but stable overall.  -Patient tolerated her cycle 1 of her treatment well overall with mild toxicity of diarrhea. -Patient can proceed with cycle 2 of her treatment without any dose modification.  -Answered all of patient's questions.  FOLLOW UP: Per integrated scheduling  The total time spent in the appointment was 30  minutes* .  All of the patient's questions were answered with apparent satisfaction. The patient knows to call the clinic with any problems, questions or concerns.   Wyvonnia Lora MD MS AAHIVMS [facility] Hematology/Oncology Physician [facility]  .*Total Encounter Time as defined by the Centers for Medicare and Medicaid Services includes, in addition to the face-to-face time of a patient visit (documented in the note above) non-face-to-face time: obtaining and reviewing outside history, ordering and reviewing medications, tests or procedures, care coordination (communications with other health care professionals or caregivers) and documentation in the medical record.  I,Param Shah,acting as a Neurosurgeon for Wyvonnia Lora, MD.,have documented all relevant documentation on the behalf of Wyvonnia Lora, MD,as directed by  Wyvonnia Lora, MD while in the presence of Wyvonnia Lora, MD.  .I have reviewed the above documentation for accuracy and completeness, and I agree with the above. [name] MD  ADDENDUM  UCx -- Willey Blade -- prescription for vantin sent to her pharmacy.

## 2023-03-24 NOTE — Progress Notes (Signed)
Infused treatment over 1 and half hours today and pt. tolerated well, no issues of diarrhea noted.

## 2023-03-24 NOTE — Progress Notes (Signed)
Inf Belinostat over 60 min for better pt tolerance per Dr Candise Che.  Ebony Hail, Pharm.D., CPP 03/24/2023@4 :12 PM

## 2023-03-25 ENCOUNTER — Inpatient Hospital Stay: Payer: PPO

## 2023-03-25 ENCOUNTER — Other Ambulatory Visit: Payer: Self-pay

## 2023-03-25 VITALS — BP 97/72 | HR 98 | Temp 98.4°F | Resp 14

## 2023-03-25 DIAGNOSIS — C8448 Peripheral T-cell lymphoma, not classified, lymph nodes of multiple sites: Secondary | ICD-10-CM

## 2023-03-25 DIAGNOSIS — Z5111 Encounter for antineoplastic chemotherapy: Secondary | ICD-10-CM | POA: Diagnosis not present

## 2023-03-25 LAB — URINALYSIS, COMPLETE (UACMP) WITH MICROSCOPIC
Bilirubin Urine: NEGATIVE
Glucose, UA: NEGATIVE mg/dL
Ketones, ur: NEGATIVE mg/dL
Leukocytes,Ua: NEGATIVE
Nitrite: POSITIVE — AB
Protein, ur: NEGATIVE mg/dL
Specific Gravity, Urine: 1.016 (ref 1.005–1.030)
pH: 5 (ref 5.0–8.0)

## 2023-03-25 MED ORDER — SODIUM CHLORIDE 0.9 % IV SOLN: Freq: Once | INTRAVENOUS | Status: AC

## 2023-03-25 MED ORDER — HEPARIN SOD (PORK) LOCK FLUSH 100 UNIT/ML IV SOLN
500.0000 [IU] | Freq: Once | INTRAVENOUS | Status: AC | PRN
  Administered 2023-03-25: 500 [IU]

## 2023-03-25 MED ORDER — LORAZEPAM 1 MG PO TABS
0.5000 mg | ORAL_TABLET | Freq: Once | ORAL | Status: AC
  Administered 2023-03-25: 0.5 mg via ORAL
  Filled 2023-03-25: qty 1

## 2023-03-25 MED ORDER — SODIUM CHLORIDE 0.9 % IV SOLN
1000.0000 mg/m2 | Freq: Once | INTRAVENOUS | Status: AC
  Administered 2023-03-25: 2000 mg via INTRAVENOUS
  Filled 2023-03-25: qty 40

## 2023-03-25 MED ORDER — SODIUM CHLORIDE 0.9% FLUSH
10.0000 mL | INTRAVENOUS | Status: DC | PRN
  Administered 2023-03-25: 10 mL

## 2023-03-25 MED ORDER — ACETAMINOPHEN 325 MG PO TABS
650.0000 mg | ORAL_TABLET | Freq: Once | ORAL | Status: AC
  Administered 2023-03-25: 650 mg via ORAL
  Filled 2023-03-25: qty 2

## 2023-03-25 MED ORDER — PROCHLORPERAZINE MALEATE 10 MG PO TABS
10.0000 mg | ORAL_TABLET | Freq: Once | ORAL | Status: AC
  Administered 2023-03-25: 10 mg via ORAL
  Filled 2023-03-25: qty 1

## 2023-03-25 NOTE — Progress Notes (Signed)
Pt provided this RN her UA/UC sample at 1715 on 03/25/2023, sample was hand delivered by NT to main [facility] lab at 1720 on 03/25/2023.

## 2023-03-25 NOTE — Progress Notes (Signed)
Per Pt request Beleodaq infusion administered over 1.5 hours.

## 2023-03-25 NOTE — Patient Instructions (Signed)
[facility]  Discharge Instructions: Thank you for choosing [facility] to provide your oncology and hematology care.   If you have a lab appointment with the Cancer Center, please go directly to the Cancer Center and check in at the registration area.   Wear comfortable clothing and clothing appropriate for easy access to any Portacath or PICC line.   We strive to give you quality time with your provider. You may need to reschedule your appointment if you arrive late (15 or more minutes).  Arriving late affects you and other patients whose appointments are after yours.  Also, if you miss three or more appointments without notifying the office, you may be dismissed from the clinic at the provider's discretion.      For prescription refill requests, have your pharmacy contact our office and allow 72 hours for refills to be completed.    Today you received the following chemotherapy and/or immunotherapy agent: Belinostat   To help prevent nausea and vomiting after your treatment, we encourage you to take your nausea medication as directed.  BELOW ARE SYMPTOMS THAT SHOULD BE REPORTED IMMEDIATELY: *FEVER GREATER THAN 100.4 F (38 C) OR HIGHER *CHILLS OR SWEATING *NAUSEA AND VOMITING THAT IS NOT CONTROLLED WITH YOUR NAUSEA MEDICATION *UNUSUAL SHORTNESS OF BREATH *UNUSUAL BRUISING OR BLEEDING *URINARY PROBLEMS (pain or burning when urinating, or frequent urination) *BOWEL PROBLEMS (unusual diarrhea, constipation, pain near the anus) TENDERNESS IN MOUTH AND THROAT WITH OR WITHOUT PRESENCE OF ULCERS (sore throat, sores in mouth, or a toothache) UNUSUAL RASH, SWELLING OR PAIN  UNUSUAL VAGINAL DISCHARGE OR ITCHING   Items with * indicate a potential emergency and should be followed up as soon as possible or go to the Emergency Department if any problems should occur.  Please show the CHEMOTHERAPY ALERT CARD or IMMUNOTHERAPY ALERT CARD at check-in  to the Emergency Department and triage nurse.  Should you have questions after your visit or need to cancel or reschedule your appointment, please contact [facility]  Dept: [phone]  and follow the prompts.  Office hours are 8:00 a.m. to 4:30 p.m. Monday - Friday. Please note that voicemails left after 4:00 p.m. may not be returned until the following business day.  We are closed weekends and major holidays. You have access to a nurse at all times for urgent questions. Please call the main number to the clinic Dept: [phone] and follow the prompts.   For any non-urgent questions, you may also contact your provider using MyChart. We now offer e-Visits for anyone 18 and older to request care online for non-urgent symptoms. For details visit [email or web address].   Also download the MyChart app! Go to the app store, search "MyChart", open the app, select [facility], and log in with your MyChart username and password.  Belinostat Injection What is this medication? BELINOSTAT (beh LIH noh STAT) treats lymphoma. It works by slowing down the growth of cancer cells. This medicine may be used for other purposes; ask your health care provider or pharmacist if you have questions. COMMON BRAND NAME(S): Beleodaq What should I tell my care team before I take this medication? They need to know if you have any of these conditions: Carry the UGT1A1*28 gene Infection Kidney disease Liver disease Low blood cell levels (white cells, platelets, or red blood cells) An unusual or allergic reaction to belinostat, other medications, foods, dyes, or preservatives If you or your partner are pregnant   or trying to get pregnant Breast-feeding How should I use this medication? This medication is infused into a vein. It is given by your care team in a hospital or clinic setting. Talk to your care team about the use of this medication in children. Special care may be  needed. Overdosage: If you think you have taken too much of this medicine contact a poison control center or emergency room at once. NOTE: This medicine is only for you. Do not share this medicine with others. What if I miss a dose? Keep appointments for follow-up doses. It is important not to miss your dose. Call your care team if you are unable to keep an appointment. What may interact with this medication? Do not take this medication with any of the following: Atazanavir This medication may affect how other medications work, and other medications may affect the way this medication works. Talk with your care team about all of the medications you take. They may suggest changes to your treatment plan to lower the risk of side effects and to make sure your medications work as intended. This list may not describe all possible interactions. Give your health care provider a list of all the medicines, herbs, non-prescription drugs, or dietary supplements you use. Also tell them if you smoke, drink alcohol, or use illegal drugs. Some items may interact with your medicine. What should I watch for while using this medication? Your condition will be monitored carefully while you are receiving this medication. This medication may make you feel generally unwell. This is not uncommon as chemotherapy can affect healthy cells as well as cancer cells. Report any side effects. Continue your course of treatment even though you feel ill unless your care team tells you to stop. You may need blood work while taking this medication. This medication may increase your risk to bruise or bleed. Call your care team if you notice any unusual bleeding. This medication may increase your risk of getting an infection. Call your care team for advice if you get a fever, chills, sore throat, or other symptoms of a cold or flu. Do not treat yourself. Try to avoid being around people who are sick. Call your care team if you are around  anyone with measles, chickenpox, or if you develop sores or blisters that do not heal properly. Be careful brushing or flossing your teeth or using a toothpick because you may get an infection or bleed more easily. If you have any dental work done, tell your dentist you are receiving this medication. Talk to your care team if you or your partner wish to become pregnant or think either of you might be pregnant. This medication can cause serious birth defects if taken during pregnancy or for 6 months after stopping treatment. A reliable form of contraception is recommended while taking this medication and for 6 months after stopping treatment. Talk to your care team about effective forms of contraception. Use a condom during sex and for 3 months after stopping treatment. Tell your care team right away if you think your partner might be pregnant. This medication can cause serious birth defects. This medication may cause infertility. Talk to your care team if you are concerned about your fertility. Do not breast-feed while taking this medication and for 2 weeks after stopping therapy. What side effects may I notice from receiving this medication? Side effects that you should report to your care team as soon as possible: Allergic reactions--skin rash, itching, hives, swelling of the   face, lips, tongue, or throat Infection--fever, chills, cough, sore throat, wounds that don't heal, pain or trouble when passing urine, general feeling of discomfort or being unwell Liver injury--right upper belly pain, loss of appetite, nausea, light-colored stool, dark yellow or brown urine, yellowing skin or eyes, unusual weakness or fatigue Low red blood cell level--unusual weakness or fatigue, dizziness, headache, trouble breathing Tumor lysis syndrome (TLS)--nausea, vomiting, diarrhea, decrease in the amount of urine, dark urine, unusual weakness or fatigue, confusion, muscle pain or cramps, fast or irregular heartbeat,  joint pain Unusual bruising or bleeding Side effects that usually do not require medical attention (report to your care team if they continue or are bothersome): Diarrhea Fatigue Nausea Vomiting This list may not describe all possible side effects. Call your doctor for medical advice about side effects. You may report side effects to FDA at 1-800-FDA-1088. Where should I keep my medication? This medication is given in a hospital or clinic. It will not be stored at home. NOTE: This sheet is a summary. It may not cover all possible information. If you have questions about this medicine, talk to your doctor, pharmacist, or health care provider.  2024 Elsevier/Gold Standard (2021-11-28 00:00:00)   

## 2023-03-25 NOTE — Progress Notes (Signed)
Ok to proceed with treatment today with elevated temperature per Dr. Candise Che. UA/UC being ordered to rule out UTI.

## 2023-03-26 ENCOUNTER — Encounter: Payer: Self-pay | Admitting: Hematology

## 2023-03-26 ENCOUNTER — Other Ambulatory Visit: Payer: Self-pay

## 2023-03-26 ENCOUNTER — Inpatient Hospital Stay: Payer: PPO

## 2023-03-26 VITALS — BP 103/63 | HR 84 | Temp 98.2°F | Resp 16

## 2023-03-26 DIAGNOSIS — C8448 Peripheral T-cell lymphoma, not classified, lymph nodes of multiple sites: Secondary | ICD-10-CM

## 2023-03-26 DIAGNOSIS — Z5111 Encounter for antineoplastic chemotherapy: Secondary | ICD-10-CM | POA: Diagnosis not present

## 2023-03-26 LAB — URINE CULTURE

## 2023-03-26 MED ORDER — SODIUM CHLORIDE 0.9 % IV SOLN
1000.0000 mg/m2 | Freq: Once | INTRAVENOUS | Status: AC
  Administered 2023-03-26: 2000 mg via INTRAVENOUS
  Filled 2023-03-26: qty 40

## 2023-03-26 MED ORDER — HEPARIN SOD (PORK) LOCK FLUSH 100 UNIT/ML IV SOLN
500.0000 [IU] | Freq: Once | INTRAVENOUS | Status: AC | PRN
  Administered 2023-03-26: 500 [IU]

## 2023-03-26 MED ORDER — LORAZEPAM 1 MG PO TABS
0.5000 mg | ORAL_TABLET | Freq: Once | ORAL | Status: AC
  Administered 2023-03-26: 0.5 mg via ORAL
  Filled 2023-03-26: qty 1

## 2023-03-26 MED ORDER — SODIUM CHLORIDE 0.9% FLUSH
10.0000 mL | INTRAVENOUS | Status: DC | PRN
  Administered 2023-03-26: 10 mL

## 2023-03-26 MED ORDER — PROCHLORPERAZINE MALEATE 10 MG PO TABS
10.0000 mg | ORAL_TABLET | Freq: Once | ORAL | Status: AC
  Administered 2023-03-26: 10 mg via ORAL
  Filled 2023-03-26: qty 1

## 2023-03-26 MED ORDER — SODIUM CHLORIDE 0.9 % IV SOLN: Freq: Once | INTRAVENOUS | Status: AC

## 2023-03-26 MED ORDER — ACETAMINOPHEN 325 MG PO TABS
650.0000 mg | ORAL_TABLET | Freq: Once | ORAL | Status: AC
  Administered 2023-03-26: 650 mg via ORAL
  Filled 2023-03-26: qty 2

## 2023-03-26 NOTE — Patient Instructions (Signed)
[facility]  Discharge Instructions: Thank you for choosing [facility] to provide your oncology and hematology care.   If you have a lab appointment with the Cancer Center, please go directly to the Cancer Center and check in at the registration area.   Wear comfortable clothing and clothing appropriate for easy access to any Portacath or PICC line.   We strive to give you quality time with your provider. You may need to reschedule your appointment if you arrive late (15 or more minutes).  Arriving late affects you and other patients whose appointments are after yours.  Also, if you miss three or more appointments without notifying the office, you may be dismissed from the clinic at the provider's discretion.      For prescription refill requests, have your pharmacy contact our office and allow 72 hours for refills to be completed.    Today you received the following chemotherapy and/or immunotherapy agent: Belinostat   To help prevent nausea and vomiting after your treatment, we encourage you to take your nausea medication as directed.  BELOW ARE SYMPTOMS THAT SHOULD BE REPORTED IMMEDIATELY: *FEVER GREATER THAN 100.4 F (38 C) OR HIGHER *CHILLS OR SWEATING *NAUSEA AND VOMITING THAT IS NOT CONTROLLED WITH YOUR NAUSEA MEDICATION *UNUSUAL SHORTNESS OF BREATH *UNUSUAL BRUISING OR BLEEDING *URINARY PROBLEMS (pain or burning when urinating, or frequent urination) *BOWEL PROBLEMS (unusual diarrhea, constipation, pain near the anus) TENDERNESS IN MOUTH AND THROAT WITH OR WITHOUT PRESENCE OF ULCERS (sore throat, sores in mouth, or a toothache) UNUSUAL RASH, SWELLING OR PAIN  UNUSUAL VAGINAL DISCHARGE OR ITCHING   Items with * indicate a potential emergency and should be followed up as soon as possible or go to the Emergency Department if any problems should occur.  Please show the CHEMOTHERAPY ALERT CARD or IMMUNOTHERAPY ALERT CARD at check-in  to the Emergency Department and triage nurse.  Should you have questions after your visit or need to cancel or reschedule your appointment, please contact [facility]  Dept: [phone]  and follow the prompts.  Office hours are 8:00 a.m. to 4:30 p.m. Monday - Friday. Please note that voicemails left after 4:00 p.m. may not be returned until the following business day.  We are closed weekends and major holidays. You have access to a nurse at all times for urgent questions. Please call the main number to the clinic Dept: [phone] and follow the prompts.   For any non-urgent questions, you may also contact your provider using MyChart. We now offer e-Visits for anyone 33 and older to request care online for non-urgent symptoms. For details visit [email or web address].   Also download the MyChart app! Go to the app store, search "MyChart", open the app, select [facility], and log in with your MyChart username and password.  Belinostat Injection What is this medication? BELINOSTAT (beh LIH noh STAT) treats lymphoma. It works by slowing down the growth of cancer cells. This medicine may be used for other purposes; ask your health care provider or pharmacist if you have questions. COMMON BRAND NAME(S): Beleodaq What should I tell my care team before I take this medication? They need to know if you have any of these conditions: Carry the UGT1A1*28 gene Infection Kidney disease Liver disease Low blood cell levels (white cells, platelets, or red blood cells) An unusual or allergic reaction to belinostat, other medications, foods, dyes, or preservatives If you or your partner are pregnant  or trying to get pregnant Breast-feeding How should I use this medication? This medication is infused into a vein. It is given by your care team in a hospital or clinic setting. Talk to your care team about the use of this medication in children. Special care may be  needed. Overdosage: If you think you have taken too much of this medicine contact a poison control center or emergency room at once. NOTE: This medicine is only for you. Do not share this medicine with others. What if I miss a dose? Keep appointments for follow-up doses. It is important not to miss your dose. Call your care team if you are unable to keep an appointment. What may interact with this medication? Do not take this medication with any of the following: Atazanavir This medication may affect how other medications work, and other medications may affect the way this medication works. Talk with your care team about all of the medications you take. They may suggest changes to your treatment plan to lower the risk of side effects and to make sure your medications work as intended. This list may not describe all possible interactions. Give your health care provider a list of all the medicines, herbs, non-prescription drugs, or dietary supplements you use. Also tell them if you smoke, drink alcohol, or use illegal drugs. Some items may interact with your medicine. What should I watch for while using this medication? Your condition will be monitored carefully while you are receiving this medication. This medication may make you feel generally unwell. This is not uncommon as chemotherapy can affect healthy cells as well as cancer cells. Report any side effects. Continue your course of treatment even though you feel ill unless your care team tells you to stop. You may need blood work while taking this medication. This medication may increase your risk to bruise or bleed. Call your care team if you notice any unusual bleeding. This medication may increase your risk of getting an infection. Call your care team for advice if you get a fever, chills, sore throat, or other symptoms of a cold or flu. Do not treat yourself. Try to avoid being around people who are sick. Call your care team if you are around  anyone with measles, chickenpox, or if you develop sores or blisters that do not heal properly. Be careful brushing or flossing your teeth or using a toothpick because you may get an infection or bleed more easily. If you have any dental work done, tell your dentist you are receiving this medication. Talk to your care team if you or your partner wish to become pregnant or think either of you might be pregnant. This medication can cause serious birth defects if taken during pregnancy or for 6 months after stopping treatment. A reliable form of contraception is recommended while taking this medication and for 6 months after stopping treatment. Talk to your care team about effective forms of contraception. Use a condom during sex and for 3 months after stopping treatment. Tell your care team right away if you think your partner might be pregnant. This medication can cause serious birth defects. This medication may cause infertility. Talk to your care team if you are concerned about your fertility. Do not breast-feed while taking this medication and for 2 weeks after stopping therapy. What side effects may I notice from receiving this medication? Side effects that you should report to your care team as soon as possible: Allergic reactions--skin rash, itching, hives, swelling of the  face, lips, tongue, or throat Infection--fever, chills, cough, sore throat, wounds that don't heal, pain or trouble when passing urine, general feeling of discomfort or being unwell Liver injury--right upper belly pain, loss of appetite, nausea, light-colored stool, dark yellow or brown urine, yellowing skin or eyes, unusual weakness or fatigue Low red blood cell level--unusual weakness or fatigue, dizziness, headache, trouble breathing Tumor lysis syndrome (TLS)--nausea, vomiting, diarrhea, decrease in the amount of urine, dark urine, unusual weakness or fatigue, confusion, muscle pain or cramps, fast or irregular heartbeat,  joint pain Unusual bruising or bleeding Side effects that usually do not require medical attention (report to your care team if they continue or are bothersome): Diarrhea Fatigue Nausea Vomiting This list may not describe all possible side effects. Call your doctor for medical advice about side effects. You may report side effects to FDA at 1-800-FDA-1088. Where should I keep my medication? This medication is given in a hospital or clinic. It will not be stored at home. NOTE: This sheet is a summary. It may not cover all possible information. If you have questions about this medicine, talk to your doctor, pharmacist, or health care provider.  2024 Elsevier/Gold Standard (2021-11-28 00:00:00)

## 2023-03-26 NOTE — Addendum Note (Signed)
Addended by: Neita Goodnight on: 03/26/2023 02:43 PM   Modules accepted: Orders

## 2023-03-27 ENCOUNTER — Inpatient Hospital Stay: Payer: PPO

## 2023-03-27 VITALS — BP 112/63 | HR 79 | Temp 98.2°F | Resp 18

## 2023-03-27 DIAGNOSIS — Z5111 Encounter for antineoplastic chemotherapy: Secondary | ICD-10-CM | POA: Diagnosis not present

## 2023-03-27 DIAGNOSIS — C8448 Peripheral T-cell lymphoma, not classified, lymph nodes of multiple sites: Secondary | ICD-10-CM

## 2023-03-27 LAB — URINE CULTURE: Culture: >=100000 — AB

## 2023-03-27 MED ORDER — SODIUM CHLORIDE 0.9 % IV SOLN
1000.0000 mg/m2 | Freq: Once | INTRAVENOUS | Status: AC
  Administered 2023-03-27: 2000 mg via INTRAVENOUS
  Filled 2023-03-27: qty 40

## 2023-03-27 MED ORDER — ACETAMINOPHEN 325 MG PO TABS
650.0000 mg | ORAL_TABLET | Freq: Once | ORAL | Status: AC
  Administered 2023-03-27: 650 mg via ORAL
  Filled 2023-03-27: qty 2

## 2023-03-27 MED ORDER — SODIUM CHLORIDE 0.9 % IV SOLN: Freq: Once | INTRAVENOUS | Status: AC

## 2023-03-27 MED ORDER — LORAZEPAM 1 MG PO TABS
0.5000 mg | ORAL_TABLET | Freq: Once | ORAL | Status: AC
  Administered 2023-03-27: 0.5 mg via ORAL
  Filled 2023-03-27: qty 1

## 2023-03-27 MED ORDER — PROCHLORPERAZINE MALEATE 10 MG PO TABS
10.0000 mg | ORAL_TABLET | Freq: Once | ORAL | Status: AC
  Administered 2023-03-27: 10 mg via ORAL
  Filled 2023-03-27: qty 1

## 2023-03-28 ENCOUNTER — Other Ambulatory Visit: Payer: Self-pay

## 2023-03-28 ENCOUNTER — Inpatient Hospital Stay: Payer: PPO

## 2023-03-28 ENCOUNTER — Encounter: Payer: Self-pay | Admitting: Hematology

## 2023-03-28 ENCOUNTER — Emergency Department (HOSPITAL_COMMUNITY)
Admission: EM | Admit: 2023-03-28 | Discharge: 2023-03-28 | Disposition: A | Discharge status: Home / Self Care | Payer: PRIVATE HEALTH INSURANCE | Attending: Emergency Medicine | Admitting: Emergency Medicine

## 2023-03-28 ENCOUNTER — Emergency Department (HOSPITAL_COMMUNITY): Payer: PRIVATE HEALTH INSURANCE

## 2023-03-28 ENCOUNTER — Encounter (HOSPITAL_COMMUNITY): Payer: Self-pay

## 2023-03-28 ENCOUNTER — Other Ambulatory Visit: Payer: Self-pay | Admitting: Hematology

## 2023-03-28 DIAGNOSIS — E860 Dehydration: Secondary | ICD-10-CM | POA: Diagnosis not present

## 2023-03-28 DIAGNOSIS — W19XXXA Unspecified fall, initial encounter: Secondary | ICD-10-CM | POA: Insufficient documentation

## 2023-03-28 DIAGNOSIS — Z043 Encounter for examination and observation following other accident: Secondary | ICD-10-CM | POA: Diagnosis not present

## 2023-03-28 DIAGNOSIS — Z8553 Personal history of malignant neoplasm of renal pelvis: Secondary | ICD-10-CM | POA: Insufficient documentation

## 2023-03-28 DIAGNOSIS — I10 Essential (primary) hypertension: Secondary | ICD-10-CM | POA: Insufficient documentation

## 2023-03-28 DIAGNOSIS — R55 Syncope and collapse: Secondary | ICD-10-CM | POA: Diagnosis present

## 2023-03-28 DIAGNOSIS — R112 Nausea with vomiting, unspecified: Secondary | ICD-10-CM | POA: Diagnosis not present

## 2023-03-28 DIAGNOSIS — Y92002 Bathroom of unspecified non-institutional (private) residence single-family (private) house as the place of occurrence of the external cause: Secondary | ICD-10-CM | POA: Insufficient documentation

## 2023-03-28 DIAGNOSIS — Z96611 Presence of right artificial shoulder joint: Secondary | ICD-10-CM | POA: Diagnosis not present

## 2023-03-28 DIAGNOSIS — C8448 Peripheral T-cell lymphoma, not classified, lymph nodes of multiple sites: Secondary | ICD-10-CM

## 2023-03-28 DIAGNOSIS — Z79899 Other long term (current) drug therapy: Secondary | ICD-10-CM | POA: Diagnosis not present

## 2023-03-28 DIAGNOSIS — R519 Headache, unspecified: Secondary | ICD-10-CM | POA: Diagnosis not present

## 2023-03-28 DIAGNOSIS — Z853 Personal history of malignant neoplasm of breast: Secondary | ICD-10-CM | POA: Diagnosis not present

## 2023-03-28 LAB — URINALYSIS, ROUTINE W REFLEX MICROSCOPIC
Bilirubin Urine: NEGATIVE
Glucose, UA: NEGATIVE mg/dL
Hgb urine dipstick: NEGATIVE
Ketones, ur: NEGATIVE mg/dL
Leukocytes,Ua: NEGATIVE
Nitrite: POSITIVE — AB
Protein, ur: NEGATIVE mg/dL
Specific Gravity, Urine: 1.014 (ref 1.005–1.030)
pH: 6 (ref 5.0–8.0)

## 2023-03-28 LAB — CBC WITH DIFFERENTIAL/PLATELET
Abs Immature Granulocytes: 0.01 10*3/uL (ref 0.00–0.07)
Basophils Absolute: 0 10*3/uL (ref 0.0–0.1)
Basophils Relative: 0 %
Eosinophils Absolute: 0.3 10*3/uL (ref 0.0–0.5)
Eosinophils Relative: 8 %
HCT: 32.6 % — ABNORMAL LOW (ref 36.0–46.0)
Hemoglobin: 9.8 g/dL — ABNORMAL LOW (ref 12.0–15.0)
Immature Granulocytes: 0 %
Lymphocytes Relative: 19 %
Lymphs Abs: 0.7 10*3/uL (ref 0.7–4.0)
MCH: 25.3 pg — ABNORMAL LOW (ref 26.0–34.0)
MCHC: 30.1 g/dL (ref 30.0–36.0)
MCV: 84 fL (ref 80.0–100.0)
Monocytes Absolute: 0.4 10*3/uL (ref 0.1–1.0)
Monocytes Relative: 11 %
Neutro Abs: 2.3 10*3/uL (ref 1.7–7.7)
Neutrophils Relative %: 62 %
Platelets: 129 10*3/uL — ABNORMAL LOW (ref 150–400)
RBC: 3.88 MIL/uL (ref 3.87–5.11)
RDW: 17.6 % — ABNORMAL HIGH (ref 11.5–15.5)
WBC: 3.7 10*3/uL — ABNORMAL LOW (ref 4.0–10.5)
nRBC: 0 % (ref 0.0–0.2)

## 2023-03-28 LAB — COMPREHENSIVE METABOLIC PANEL
ALT: 29 U/L (ref 0–44)
AST: 25 U/L (ref 15–41)
Albumin: 3.3 g/dL — ABNORMAL LOW (ref 3.5–5.0)
Alkaline Phosphatase: 73 U/L (ref 38–126)
Anion gap: 5 (ref 5–15)
BUN: 22 mg/dL (ref 8–23)
CO2: 24 mmol/L (ref 22–32)
Calcium: 8.4 mg/dL — ABNORMAL LOW (ref 8.9–10.3)
Chloride: 110 mmol/L (ref 98–111)
Creatinine, Ser: 1.26 mg/dL — ABNORMAL HIGH (ref 0.44–1.00)
GFR, Estimated: 46 mL/min — ABNORMAL LOW (ref >60)
Glucose, Bld: 91 mg/dL (ref 70–99)
Potassium: 3.6 mmol/L (ref 3.5–5.1)
Sodium: 139 mmol/L (ref 135–145)
Total Bilirubin: 0.4 mg/dL (ref 0.3–1.2)
Total Protein: 6.7 g/dL (ref 6.5–8.1)

## 2023-03-28 LAB — MAGNESIUM: Magnesium: 1.9 mg/dL (ref 1.7–2.4)

## 2023-03-28 MED ORDER — HEPARIN SOD (PORK) LOCK FLUSH 100 UNIT/ML IV SOLN
500.0000 [IU] | Freq: Once | INTRAVENOUS | Status: AC
  Administered 2023-03-28: 500 [IU]
  Filled 2023-03-28: qty 5

## 2023-03-28 MED ORDER — CEFPODOXIME PROXETIL 100 MG PO TABS: 100.0000 mg | ORAL_TABLET | Freq: Two times a day (BID) | ORAL | 0 refills | Status: AC

## 2023-03-28 MED ORDER — SODIUM CHLORIDE 0.9 % IV SOLN: INTRAVENOUS | Status: DC

## 2023-03-28 MED ORDER — SODIUM CHLORIDE 0.9 % IV BOLUS
1000.0000 mL | Freq: Once | INTRAVENOUS | Status: AC
  Administered 2023-03-28 (×2): 1000 mL via INTRAVENOUS

## 2023-03-28 MED ORDER — ONDANSETRON HCL 4 MG/2ML IJ SOLN
4.0000 mg | Freq: Once | INTRAMUSCULAR | Status: AC
  Administered 2023-03-28: 4 mg via INTRAVENOUS
  Filled 2023-03-28: qty 2

## 2023-03-28 NOTE — Progress Notes (Unsigned)
Pt c/o dizziness s/p fall at home. 9/10 headache. MD notified. Treatment not given. Pt transferred to ED room 4. VS obtained prior to transfer. Pt left via WC with RN and NT to ED

## 2023-03-28 NOTE — Discharge Instructions (Addendum)
Drink plenty of fluids.  Follow-up with your cancer doctors to have your blood count and electrolytes rechecked.  Return to the emergency room fevers chills worsening symptoms.

## 2023-03-28 NOTE — ED Provider Notes (Signed)
[place] EMERGENCY DEPARTMENT AT [facility] Provider Note   CSN: [id number] Arrival date & time: 03/28/23  1600     History  Chief complaint: Lightheadedness fall.   Cynthia Velasquez is a 72 y.o. female.  HPI   Patient has a history of lumbar stenosis hypertension migraines reflux renal cancer breast cancer lymphoma who presents to the ED for evaluation of a syncopal episode couple days a fall.  Patient had been having issues with nausea and vomiting after chemotherapy treatments.  Patient states couple days ago she was feeling lightheaded and dizzy.  She was walking to the bathroom and she ended up falling.  Patient states she may have hit her head.  She was feeling dazed and having difficulty membrane everything.  She was eventually able to get up on her own and went back to bed.  Patient states the next day yesterday she had another episode of lightheadedness but she had been able to otherwise walk around throughout the day there is no focal areas of tenderness other than feeling a little bit sore in her ribs..  Patient went to her chemotherapy treatment today where she was supposed to get an infusion.  Patient mentioned that she was having a headache and was still having some dizziness after recent fall.  They recommended she go to the ED for further evaluation.  Denies any pains in her arm or leg.  No back pain.  No numbness or weakness.  No shortness of breath.  No abdominal discomfort.  Home Medications Prior to Admission medications   Medication Sig Start Date End Date Taking? Authorizing Provider  nystatin (MYCOSTATIN) 100000 UNIT/ML suspension Take 5 mLs (500,000 Units total) by mouth 4 (four) times daily as needed. Swish and swallow 03/03/23   [name], MD  aspirin-acetaminophen-caffeine [facility] MIGRAINE) [phone] MG tablet Take 2 tablets by mouth every 6 (six) hours as needed for migraine.    [provider]  cefpodoxime (VANTIN) 100 MG tablet  Take 1 tablet (100 mg total) by mouth 2 (two) times daily for 10 days. 03/28/23 04/07/23  [name], MD  diclofenac (VOLTAREN) 75 MG EC tablet Take 75 mg by mouth 2 (two) times daily as needed for mild pain.    [provider]  diphenoxylate-atropine (LOMOTIL) 2.5-0.025 MG tablet Take 2 tablets by mouth 4 (four) times daily as needed for diarrhea or loose stools. 11/05/22   Drema Dallas, MD  DULoxetine (CYMBALTA) 60 MG capsule Take 1 capsule (60 mg total) by mouth 2 (two) times daily. Take 1 capsule (30 mg) at bedtime for 1 week, if no side effects, then increase to 2 capsules (60 mg) thereafter. 12/20/22   Antony Madura, MD  lidocaine (LMX) 4 % cream Apply topically 2 (two) times daily as needed (pain). 11/05/22   Drema Dallas, MD  losartan (COZAAR) 100 MG tablet Take 100 mg by mouth daily. 01/14/17   [provider]  Menthol-Methyl Salicylate (SALONPAS PAIN RELIEF PATCH EX) Apply 1 patch topically daily as needed (pain).    [provider]  methocarbamol (ROBAXIN) 750 MG tablet Take 1 tablet (750 mg total) by mouth every 8 (eight) hours as needed for muscle spasms. 01/08/23   [name], MD  Nystatin (GERHARDT'S BUTT CREAM) CREA Apply 1 Application topically 2 (two) times daily. Patient taking differently: Apply 1 Application topically 2 (two) times daily as needed for irritation. 11/05/22   Drema Dallas, MD  nystatin (MYCOSTATIN/NYSTOP) powder Apply 1  Application topically 3 (three) times daily. Patient taking differently: Apply 1 Application topically 3 (three) times daily as needed (redness, rash). 08/30/22   Walisiewicz, Kaitlyn E, PA-C  ondansetron (ZOFRAN) 8 MG tablet Take 1 tablet (8 mg total) by mouth every 8 (eight) hours as needed for nausea or vomiting. 02/07/23   [name], MD  pantoprazole (PROTONIX) 40 MG tablet Take 1 tablet (40 mg total) by mouth daily. Patient taking differently: Take 40 mg by mouth daily as needed (for acid  reflux). 11/06/22   Drema Dallas, MD  prochlorperazine (COMPAZINE) 10 MG tablet Take 1 tablet (10 mg total) by mouth every 6 (six) hours as needed for nausea or vomiting. 02/07/23   [name], MD      Allergies    Tramadol, Belinostat, Codeine, and Vicodin [hydrocodone-acetaminophen]    Review of Systems   Review of Systems  Physical Exam Updated Vital Signs BP (!) 164/98 (BP Location: Right Arm)   Pulse 62   Temp 98.8 F (37.1 C) (Oral)   Resp 19   SpO2 100%  Physical Exam Vitals and nursing note reviewed.  Constitutional:      Appearance: She is well-developed. She is not diaphoretic.  HENT:     Head: Normocephalic and atraumatic.     Right Ear: External ear normal.     Left Ear: External ear normal.  Eyes:     General: No scleral icterus.       Right eye: No discharge.        Left eye: No discharge.     Conjunctiva/sclera: Conjunctivae normal.  Neck:     Trachea: No tracheal deviation.  Cardiovascular:     Rate and Rhythm: Normal rate and regular rhythm.  Pulmonary:     Effort: Pulmonary effort is normal. No respiratory distress.     Breath sounds: Normal breath sounds. No stridor. No wheezing or rales.  Abdominal:     General: Bowel sounds are normal. There is no distension.     Palpations: Abdomen is soft.     Tenderness: There is no abdominal tenderness. There is no guarding or rebound.  Musculoskeletal:        General: No deformity.     Cervical back: Neck supple. Tenderness present.     Thoracic back: No tenderness.     Lumbar back: No tenderness.  Skin:    General: Skin is warm and dry.     Findings: No rash.  Neurological:     General: No focal deficit present.     Mental Status: She is alert.     Cranial Nerves: No cranial nerve deficit, dysarthria or facial asymmetry.     Sensory: No sensory deficit.     Motor: No abnormal muscle tone or seizure activity.     Coordination: Coordination normal.     Comments: Normal strength and sensation  bilateral upper extremities and lower extremities, no difficulty with her balance or gait, no visual field disturbance, no difficulty with her speech, no facial droop  Psychiatric:        Mood and Affect: Mood normal.     ED Results / Procedures / Treatments   Labs (all labs ordered are listed, but only abnormal results are displayed) Labs Reviewed  CBC WITH DIFFERENTIAL/PLATELET - Abnormal; Notable for the following components:      Result Value   WBC 3.7 (*)    Hemoglobin 9.8 (*)    HCT 32.6 (*)    MCH 25.3 (*)  RDW 17.6 (*)    Platelets 129 (*)    All other components within normal limits  COMPREHENSIVE METABOLIC PANEL - Abnormal; Notable for the following components:   Creatinine, Ser 1.26 (*)    Calcium 8.4 (*)    Albumin 3.3 (*)    GFR, Estimated 46 (*)    All other components within normal limits  URINALYSIS, ROUTINE W REFLEX MICROSCOPIC - Abnormal; Notable for the following components:   Nitrite POSITIVE (*)    Bacteria, UA RARE (*)    All other components within normal limits  MAGNESIUM    EKG EKG Interpretation Date/Time:  Friday March 28 2023 17:03:24 EDT Ventricular Rate:  56 PR Interval:  165 QRS Duration:  89 QT Interval:  439 QTC Calculation: 424 R Axis:   43  Text Interpretation: Sinus rhythm No significant change since last tracing Confirmed by Linwood Dibbles [phone]) on 03/28/2023 5:06:44 PM  Radiology CT HEAD WO CONTRAST  Result Date: 03/28/2023 CLINICAL DATA:  Vomiting headache fall EXAM: CT HEAD WITHOUT CONTRAST CT CERVICAL SPINE WITHOUT CONTRAST TECHNIQUE: Multidetector CT imaging of the head and cervical spine was performed following the standard protocol without intravenous contrast. Multiplanar CT image reconstructions of the cervical spine were also generated. RADIATION DOSE REDUCTION: This exam was performed according to the departmental dose-optimization program which includes automated exposure control, adjustment of the mA and/or kV according to  patient size and/or use of iterative reconstruction technique. COMPARISON:  CT brain 12/28/2022 FINDINGS: CT HEAD FINDINGS Brain: No evidence of acute infarction, hemorrhage, hydrocephalus, extra-axial collection or mass lesion/mass effect. Vascular: No hyperdense vessel or unexpected calcification. Skull: Normal. Negative for fracture or focal lesion. Sinuses/Orbits: No acute finding. Other: None CT CERVICAL SPINE FINDINGS Alignment: Straightening of the cervical spine. No subluxation. Facet alignment within normal limits Skull base and vertebrae: No acute fracture. No primary bone lesion or focal pathologic process. Soft tissues and spinal canal: No prevertebral fluid or swelling. No visible canal hematoma. Disc levels: Moderate diffuse disc space narrowing and degenerative change C3 through C7. Facet degenerative changes at multiple levels with foraminal narrowing Upper chest: Negative. Other: None IMPRESSION: 1. Negative non contrasted CT appearance of the brain. 2. Straightening of the cervical spine with degenerative changes. No acute osseous abnormality. Electronically Signed   By: Jasmine Pang M.D.   On: 03/28/2023 18:19   CT CERVICAL SPINE WO CONTRAST  Result Date: 03/28/2023 CLINICAL DATA:  Vomiting headache fall EXAM: CT HEAD WITHOUT CONTRAST CT CERVICAL SPINE WITHOUT CONTRAST TECHNIQUE: Multidetector CT imaging of the head and cervical spine was performed following the standard protocol without intravenous contrast. Multiplanar CT image reconstructions of the cervical spine were also generated. RADIATION DOSE REDUCTION: This exam was performed according to the departmental dose-optimization program which includes automated exposure control, adjustment of the mA and/or kV according to patient size and/or use of iterative reconstruction technique. COMPARISON:  CT brain 12/28/2022 FINDINGS: CT HEAD FINDINGS Brain: No evidence of acute infarction, hemorrhage, hydrocephalus, extra-axial collection or  mass lesion/mass effect. Vascular: No hyperdense vessel or unexpected calcification. Skull: Normal. Negative for fracture or focal lesion. Sinuses/Orbits: No acute finding. Other: None CT CERVICAL SPINE FINDINGS Alignment: Straightening of the cervical spine. No subluxation. Facet alignment within normal limits Skull base and vertebrae: No acute fracture. No primary bone lesion or focal pathologic process. Soft tissues and spinal canal: No prevertebral fluid or swelling. No visible canal hematoma. Disc levels: Moderate diffuse disc space narrowing and degenerative change C3 through C7. Facet degenerative changes  at multiple levels with foraminal narrowing Upper chest: Negative. Other: None IMPRESSION: 1. Negative non contrasted CT appearance of the brain. 2. Straightening of the cervical spine with degenerative changes. No acute osseous abnormality. Electronically Signed   By: Jasmine Pang M.D.   On: 03/28/2023 18:19   DG Chest Port 1 View  Result Date: 03/28/2023 CLINICAL DATA:  Fall lightheaded EXAM: PORTABLE CHEST 1 VIEW COMPARISON:  12/28/2022 FINDINGS: Right-sided central venous port tip over the cavoatrial region. No acute airspace disease or effusion. Stable cardiomediastinal silhouette. No pneumothorax. Postsurgical changes over the axilla. Right shoulder arthroplasty. IMPRESSION: No active disease. Electronically Signed   By: Jasmine Pang M.D.   On: 03/28/2023 17:16    Procedures Procedures    Medications Ordered in ED Medications  sodium chloride 0.9 % bolus 1,000 mL (1,000 mLs Intravenous New Bag/Given 03/28/23 1738)    And  0.9 %  sodium chloride infusion ( Intravenous New Bag/Given 03/28/23 1739)  ondansetron (ZOFRAN) injection 4 mg (4 mg Intravenous Given 03/28/23 1719)    ED Course/ Medical Decision Making/ A&P Clinical Course as of 03/28/23 1846  Fri Mar 28, 2023  1800 CBC WITH DIFFERENTIAL(!) Hemoglobin decreased compared to previous.  Metabolic panel shows increased [JK]  1800  CXR without acute findings [JK]  1826 Urinalysis, Routine w reflex microscopic -Urine, Clean Catch(!) UA abnormal [JK]  1828 Head CT C-spine CT without Findings [JK]  1828 CXR without acute findings [JK]    Clinical Course User Index [JK] Linwood Dibbles, MD                             Medical Decision Making Problems Addressed: Dehydration: acute illness or injury that poses a threat to life or bodily functions  Amount and/or Complexity of Data Reviewed Labs: ordered. Decision-making details documented in ED Course. Radiology: ordered and independent interpretation performed. ECG/medicine tests: ordered.  Risk Prescription drug management.   Patient presented to the ED for evaluation of a syncopal episode a couple days ago.  Patient is undergoing chemotherapy.  She has had some issues with vomiting as well as diarrhea.  Patient exam is reassuring.  No signs of serious injury on exam.  Her neurologic exam is normal.  No deficits to suggest stroke.  CT scan was performed there is no evidence of serious injury.  Patient's labs do show a component of dehydration with slight increase in her creatinine.  Hemoglobin is slightly decreased compared to previous however patient denies any blood in her stool dark black holes.  No findings to suggest GI bleeding.  Suspect her symptoms are related to dehydration.  Patient has not had any recurrent syncopal episodes in the last 2 days.  She is able to walk without any difficulty.  Patient was treated with IV fluids.  Her urinalysis is normal and she states however that her doctor has called in antibiotics for her.  Patient overall is feeling well and feels comfortable with discharge.        Final Clinical Impression(s) / ED Diagnoses Final diagnoses:  Dehydration    Rx / DC Orders ED Discharge Orders     None         Linwood Dibbles, MD 03/28/23 1846

## 2023-03-28 NOTE — Progress Notes (Unsigned)
Patient states she has been dizzy for the past 2 days, fell in her bathroom last night, she fell into her tub & hit her rib area on the tub.  She states her ribs are sore.  She isn't sure if she hit her head.  Patient denies any bleeding or acute injury.  Dr Candise Che informed.

## 2023-03-28 NOTE — Progress Notes (Signed)
Antibiotic for Klebsiella UTI sent to her pharmacy.

## 2023-03-28 NOTE — ED Triage Notes (Signed)
Patient was at the cancer center.  5 days chemo and has been vomiting with headaches. Had a bathroom fall.  Elevated  BP 150/91

## 2023-03-31 ENCOUNTER — Encounter: Payer: Self-pay | Admitting: Hematology

## 2023-04-03 ENCOUNTER — Other Ambulatory Visit: Payer: Self-pay

## 2023-04-09 ENCOUNTER — Encounter: Payer: Self-pay | Admitting: Hematology

## 2023-04-10 ENCOUNTER — Encounter: Payer: Self-pay | Admitting: Hematology

## 2023-04-10 NOTE — Telephone Encounter (Signed)
Contacted Cynthia Velasquez in response to [employer]. Photo was taken yesterday [place] airport. Ms Rawl describes the swelling today as on both sides of her neck, with the left side larger.  She states the left side is swelling to the front center - "towards my adams apple if I had one".   She said swollen areas are painful if touched. Cynthia Velasquez said she does not have any elevation in temperature and denies any difficulty swallowing or breathing. Advised her if any of those 3 symptoms occur to seek emergency care. She said if she needs care, she will be going to [facility]. She stated she is currently visiting her sister at the [facility] where she worked and has received care in the past.  Cynthia Velasquez will be returning to [facility] on Friday August 2nd and has appts at [facility] on Monday August 5.

## 2023-04-12 ENCOUNTER — Encounter: Payer: Self-pay | Admitting: Hematology

## 2023-04-14 ENCOUNTER — Inpatient Hospital Stay: Payer: PPO | Admitting: Physician Assistant

## 2023-04-14 ENCOUNTER — Inpatient Hospital Stay: Payer: PPO

## 2023-04-14 ENCOUNTER — Encounter: Payer: Self-pay | Admitting: Hematology

## 2023-04-15 ENCOUNTER — Inpatient Hospital Stay: Payer: PPO

## 2023-04-16 ENCOUNTER — Inpatient Hospital Stay: Payer: PPO

## 2023-04-17 ENCOUNTER — Inpatient Hospital Stay: Payer: PPO

## 2023-04-18 ENCOUNTER — Inpatient Hospital Stay: Payer: PPO

## 2023-04-21 ENCOUNTER — Other Ambulatory Visit: Payer: Self-pay

## 2023-04-21 ENCOUNTER — Other Ambulatory Visit: Payer: Self-pay | Admitting: Hematology and Oncology

## 2023-04-21 ENCOUNTER — Inpatient Hospital Stay: Payer: PPO | Attending: Hematology

## 2023-04-21 ENCOUNTER — Inpatient Hospital Stay (HOSPITAL_BASED_OUTPATIENT_CLINIC_OR_DEPARTMENT_OTHER): Payer: PPO | Admitting: Physician Assistant

## 2023-04-21 ENCOUNTER — Inpatient Hospital Stay: Payer: PPO

## 2023-04-21 VITALS — BP 166/92 | HR 81 | Temp 97.5°F | Resp 16 | Wt 197.7 lb

## 2023-04-21 DIAGNOSIS — Z79899 Other long term (current) drug therapy: Secondary | ICD-10-CM | POA: Insufficient documentation

## 2023-04-21 DIAGNOSIS — C50411 Malignant neoplasm of upper-outer quadrant of right female breast: Secondary | ICD-10-CM

## 2023-04-21 DIAGNOSIS — C8448 Peripheral T-cell lymphoma, not classified, lymph nodes of multiple sites: Secondary | ICD-10-CM | POA: Diagnosis not present

## 2023-04-21 DIAGNOSIS — Z95828 Presence of other vascular implants and grafts: Secondary | ICD-10-CM

## 2023-04-21 DIAGNOSIS — Z5111 Encounter for antineoplastic chemotherapy: Secondary | ICD-10-CM | POA: Insufficient documentation

## 2023-04-21 DIAGNOSIS — Z170 Estrogen receptor positive status [ER+]: Secondary | ICD-10-CM

## 2023-04-21 LAB — CBC WITH DIFFERENTIAL (CANCER CENTER ONLY)
Abs Immature Granulocytes: 0 10*3/uL (ref 0.00–0.07)
Basophils Absolute: 0 10*3/uL (ref 0.0–0.1)
Basophils Relative: 0 %
Eosinophils Absolute: 0.1 10*3/uL (ref 0.0–0.5)
Eosinophils Relative: 3 %
HCT: 32.3 % — ABNORMAL LOW (ref 36.0–46.0)
Hemoglobin: 10.6 g/dL — ABNORMAL LOW (ref 12.0–15.0)
Immature Granulocytes: 0 %
Lymphocytes Relative: 25 %
Lymphs Abs: 0.8 10*3/uL (ref 0.7–4.0)
MCH: 26.6 pg (ref 26.0–34.0)
MCHC: 32.8 g/dL (ref 30.0–36.0)
MCV: 81 fL (ref 80.0–100.0)
Monocytes Absolute: 0.4 10*3/uL (ref 0.1–1.0)
Monocytes Relative: 12 %
Neutro Abs: 1.8 10*3/uL (ref 1.7–7.7)
Neutrophils Relative %: 60 %
Platelet Count: 224 10*3/uL (ref 150–400)
RBC: 3.99 MIL/uL (ref 3.87–5.11)
RDW: 17.6 % — ABNORMAL HIGH (ref 11.5–15.5)
WBC Count: 3.1 10*3/uL — ABNORMAL LOW (ref 4.0–10.5)
nRBC: 0 % (ref 0.0–0.2)

## 2023-04-21 LAB — CMP (CANCER CENTER ONLY)
ALT: 9 U/L (ref 0–44)
AST: 16 U/L (ref 15–41)
Albumin: 3.8 g/dL (ref 3.5–5.0)
Alkaline Phosphatase: 95 U/L (ref 38–126)
Anion gap: 6 (ref 5–15)
BUN: 18 mg/dL (ref 8–23)
CO2: 25 mmol/L (ref 22–32)
Calcium: 9.2 mg/dL (ref 8.9–10.3)
Chloride: 109 mmol/L (ref 98–111)
Creatinine: 1.02 mg/dL — ABNORMAL HIGH (ref 0.44–1.00)
GFR, Estimated: 59 mL/min — ABNORMAL LOW (ref >60)
Glucose, Bld: 91 mg/dL (ref 70–99)
Potassium: 3.5 mmol/L (ref 3.5–5.1)
Sodium: 140 mmol/L (ref 135–145)
Total Bilirubin: 0.4 mg/dL (ref 0.3–1.2)
Total Protein: 7 g/dL (ref 6.5–8.1)

## 2023-04-21 MED ORDER — SODIUM CHLORIDE 0.9 % IV SOLN
1000.0000 mg/m2 | Freq: Once | INTRAVENOUS | Status: AC
  Administered 2023-04-21: 2000 mg via INTRAVENOUS
  Filled 2023-04-21: qty 40

## 2023-04-21 MED ORDER — HEPARIN SOD (PORK) LOCK FLUSH 100 UNIT/ML IV SOLN
500.0000 [IU] | Freq: Once | INTRAVENOUS | Status: AC | PRN
  Administered 2023-04-21: 500 [IU]

## 2023-04-21 MED ORDER — SODIUM CHLORIDE 0.9% FLUSH
10.0000 mL | INTRAVENOUS | Status: DC | PRN
  Administered 2023-04-21: 10 mL

## 2023-04-21 MED ORDER — ACETAMINOPHEN 325 MG PO TABS
650.0000 mg | ORAL_TABLET | Freq: Once | ORAL | Status: AC
  Administered 2023-04-21: 650 mg via ORAL
  Filled 2023-04-21: qty 2

## 2023-04-21 MED ORDER — PROCHLORPERAZINE MALEATE 10 MG PO TABS
10.0000 mg | ORAL_TABLET | Freq: Once | ORAL | Status: AC
  Administered 2023-04-21: 10 mg via ORAL
  Filled 2023-04-21: qty 1

## 2023-04-21 MED ORDER — SODIUM CHLORIDE 0.9 % IV SOLN: Freq: Once | INTRAVENOUS | Status: AC

## 2023-04-21 MED ORDER — SODIUM CHLORIDE 0.9% FLUSH
10.0000 mL | Freq: Once | INTRAVENOUS | Status: AC
  Administered 2023-04-21: 10 mL

## 2023-04-21 MED ORDER — LORAZEPAM 1 MG PO TABS
0.5000 mg | ORAL_TABLET | Freq: Once | ORAL | Status: AC
  Administered 2023-04-21: 0.5 mg via ORAL
  Filled 2023-04-21: qty 1

## 2023-04-21 NOTE — Progress Notes (Signed)
[name]   [phone] [phone]    HEMATOLOGY ONCOLOGY CLINIC VISIT   Patient Care Team: [name], MD as PCP - General (Family Medicine) [name], MD as PCP - Hematology/Oncology (Hematology) [name], MD as Consulting Physician (Gastroenterology) [name], MD as Consulting Physician (Hematology) [name], NP as Nurse Practitioner (Nurse Practitioner) [name], MD as Consulting Physician (Radiation Oncology) [name], MD as Consulting Physician (General Surgery) [name], MD as Attending Physician (Otolaryngology)  Date of Service: 04/21/23    CHIEF COMPLAINT: f/u for evaluation and management of T cell lymphoma.  SUMMARY OF ONCOLOGIC HISTORY: Oncology History Overview Note  Cancer Staging Anal cancer (HCC) Staging form: Anus, AJCC 8th Edition - Clinical stage from 06/28/2019: Stage IIA (cT2, cN0, cM0) - Signed by [name], NP on 06/28/2019  Breast cancer of upper-outer quadrant of left female breast [facility]) Staging form: Breast, AJCC 7th Edition - Clinical stage from 09/07/2013: Stage IIA (T2, N0, cM0) - Signed by [name], MD on 09/07/2013 - Pathologic: Stage IIA (T2, N0, cM0) - Signed by [name], MD on 09/07/2013  Breast cancer of upper-outer quadrant of right female breast [facility]) Staging form: Breast, AJCC 7th Edition - Clinical stage from 09/07/2013: Stage IA (T1a, N0, cM0) - Signed by [name], MD on 09/07/2013 - Pathologic: Stage IA (T1a, N0, cM0) - Signed by [name], MD on 09/07/2013    Breast cancer of upper-outer quadrant of left female breast (HCC)  07/10/2007 Procedure   US biopsy showed invasive ductal carcinoma 0.8cm, Nottingham grade 3   07/30/2007 Surgery   She had left breast lumpectomy and LN biopsy which showed high grade invasive ductal cancer Nottingham grade 3, 2.7 cm, ER/PR/Her2 neu negative   11/24/2007 Surgery   Patient elected for bilateral  mastectomy   09/07/2008 - 03/08/2009 Chemotherapy   dates are approximate: she received adriamycin and cytoxan followed by Taxol   03/08/2009 - 05/08/2009 Radiation Therapy   dates are approximate, she received XRT    07/09/2021 Imaging   CT CAP  IMPRESSION: 1. No evidence to suggest locally recurrent ianorectal neoplasm or metastatic disease in the chest, abdomen or pelvis. 2. Status post bilateral modified radical mastectomy and bilateral axillary lymph node dissection. 3. Three small supraumbilical ventral hernias containing only omental fat. No associated bowel incarceration or obstruction at this time. 4. Aortic atherosclerosis. 5. Additional incidental findings, as above.   Breast cancer of upper-outer quadrant of right female breast (HCC)  07/10/2006 Procedure   stereotactic biopsy showed atypical hyperplasia   10/23/2006 Surgery   right lumpectomy showed invasive ductal ca (Nottingham grade 1)  2mm and DCIS, ER/PR positive Her 2 negative   10/09/2007 - 10/08/2012 Chemotherapy   Patient was placed on Tamoxifen   03/24/2017 Imaging   No acute findings. No evidence of recurrent carcinoma or metastatic disease   07/09/2021 Imaging   CT CAP  IMPRESSION: 1. No evidence to suggest locally recurrent ianorectal neoplasm or metastatic disease in the chest, abdomen or pelvis. 2. Status post bilateral modified radical mastectomy and bilateral axillary lymph node dissection. 3. Three small supraumbilical ventral hernias containing only omental fat. No associated bowel incarceration or obstruction at this time. 4. Aortic atherosclerosis. 5. Additional incidental findings, as above.   Anal cancer (HCC)  06/04/2019 Procedure   Colonoscopy per Dr. [name]: Findings-the digital rectal exam revealed a 3 cm diameter firm rectal mass.  The mass was noncircumferential and located predominantly at  the right bowel wall at the anorectal junction. A nonobstructing mass was found at the  anus and in the rectum    06/04/2019 Initial Biopsy   Follow pathology: Large intestine, rectum biopsy: Invasive well to moderately differentiated squamous cell carcinoma.  No rectal mucosa present.  There is strong diffuse expression of P 16 immunostain.  CDX 2, p63 and mCEA immunostains are also used in the diagnostic work-up of the case.   06/04/2019 Initial Diagnosis   Anal cancer (HCC)   06/21/2019 PET scan   IMPRESSION: 1. Anorectal primary. No hypermetabolic metastatic disease within the chest, abdomen, or pelvis. Perirectal nodes, at least 1 of which is new since 02/26/2018 CT, suspicious based on size and interval development. 2. Mild limitations secondary to beam hardening artifact from bilateral hip arthroplasty. 3.  Aortic Atherosclerosis (ICD10-I70.0).   06/28/2019 Cancer Staging   Staging form: Anus, AJCC 8th Edition - Clinical stage from 06/28/2019: Stage IIA (cT2, cN0, cM0) - Signed by [name], NP on 06/28/2019   06/28/2019 - 07/26/2019 Chemotherapy   concurrent chemoRT with Mitomycin and 5FU on week 1 and week 5 on starting 06/28/19. Last dose on 07/26/19   06/28/2019 - 08/09/2019 Radiation Therapy   concurrent chemoRT with Dr Mitzi Hansen 06/28/19-08/09/19   11/01/2019 PET scan   IMPRESSION: 1. Marked interval decrease in hypermetabolism noted at the level of the anal rectal primary. No evidence for hypermetabolic metastatic disease in the chest, abdomen, or pelvis. The perirectal lymph nodes identified previously have resolved in the interval. 2.  Aortic Atherosclerois (ICD10-170.0)   08/07/2020 Imaging   CT CAP  IMPRESSION: 1. No findings for residual/recurrent anal tumor, regional lymphadenopathy or metastatic disease. 2. Status post cholecystectomy. No biliary dilatation. 3. Small anterior abdominal wall hernia containing fat. 4. Aortic atherosclerosis.   Aortic Atherosclerosis (ICD10-I70.0).     07/09/2021 Imaging   CT CAP  IMPRESSION: 1. No  evidence to suggest locally recurrent ianorectal neoplasm or metastatic disease in the chest, abdomen or pelvis. 2. Status post bilateral modified radical mastectomy and bilateral axillary lymph node dissection. 3. Three small supraumbilical ventral hernias containing only omental fat. No associated bowel incarceration or obstruction at this time. 4. Aortic atherosclerosis. 5. Additional incidental findings, as above.   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 She is here for cycle 3 day 1 of her belinostat.  Ms. Tovar reports that around 04/11/2023 when she was visiting in [place], she noticed increase in lymph nodes in her neck and under her jaw. She reports the lymph nodes are painful to touch because they are enlarging in size. She reports some mild dysphagia but denies regurgitation or odynophagia. She reports more pronounced nausea without vomiting which improved with the prescribed antiemetics. She reports diarrhea, averaging 2 episodes per day. The diarrhea did improve with imodium but results in some constipation as well. She denies easy bruising or signs of bleeding. She reports increased fatigue but is able to complete her ADLs on her own. She denies fevers, chills,  night sweats, shortness of breath, chest pain or cough. She has no other complaints.  ROS  10 Point review of Systems was done is negative except as noted above.   MEDICAL HISTORY:  Past Medical History:  Diagnosis Date   Anal cancer (HCC) dx'd 05/2019    Anxiety    Arthritis    Bilateral breast cancer (HCC) 10/21/2013   Breast cancer (HCC)    Cancer (HCC)    Depression    Gallstones    GERD (gastroesophageal reflux disease)    doesn't take any meds for this   Headache    h/o migraines       History of bladder infections    History of hiatal hernia    History of migraine    Hyperlipidemia    takes Simvastatin daily   Hypertension    takes Hyzaar   Joint pain    Joint swelling    Lumbar stenosis    Neuropathy 09/07/2013   Pneumonia    Pre-diabetes     SURGICAL HISTORY: Past Surgical History:  Procedure Laterality Date   ABDOMINAL HYSTERECTOMY     bone spur removed from left foot     CHOLECYSTECTOMY     COLONOSCOPY     COLONOSCOPY WITH PROPOFOL Bilateral 12/07/2021   Procedure: COLONOSCOPY WITH PROPOFOL;  Surgeon: Vida Rigger, MD;  Location: [facility];  Service: Endoscopy;  Laterality: Bilateral;   double mastectomy      ESOPHAGOGASTRODUODENOSCOPY N/A 12/07/2021   Procedure: ESOPHAGOGASTRODUODENOSCOPY (EGD);  Surgeon: Vida Rigger, MD;  Location: [facility];  Service: Endoscopy;  Laterality: N/A;   HERNIA REPAIR     umbilical   IR IMAGING GUIDED PORT INSERTION  06/25/2022   right knee arthroscopy     right shoulder arthroscopy     surgery for hiatal hernia     TONSILLECTOMY     TOTAL HIP ARTHROPLASTY Left 02/12/2013   TOTAL HIP ARTHROPLASTY Left 02/12/2013   Procedure: LEFT TOTAL HIP ARTHROPLASTY;  Surgeon: Nestor Lewandowsky, MD;  Location: [facility] OR;  Service: Orthopedics;  Laterality: Left;   TOTAL HIP ARTHROPLASTY Right 05/03/2013   Procedure: TOTAL HIP ARTHROPLASTY;  Surgeon: Nestor Lewandowsky, MD;  Location: [facility] OR;  Service: Orthopedics;  Laterality: Right;   TOTAL KNEE ARTHROPLASTY Right 02/27/2015   Procedure: TOTAL KNEE ARTHROPLASTY;  Surgeon: Gean Birchwood, MD;  Location: [facility] OR;  Service: Orthopedics;  Laterality: Right;   TOTAL SHOULDER ARTHROPLASTY Right 09/12/2016   Procedure: RIGHT TOTAL SHOULDER ARTHROPLASTY;   Surgeon: Jones Broom, MD;  Location: [facility] OR;  Service: Orthopedics;  Laterality: Right;  RIGHT TOTAL SHOULDER ARTHROPLASTY    I have reviewed the social history and family history with the patient and they are unchanged from previous note.  ALLERGIES:  is allergic to tramadol, belinostat, codeine, and vicodin [hydrocodone-acetaminophen].  MEDICATIONS:  Current Outpatient Medications  Medication Sig Dispense Refill   aspirin-acetaminophen-caffeine (EXCEDRIN MIGRAINE) 250-250-65 MG tablet Take 2 tablets by mouth every 6 (six) hours as needed for migraine.     DULoxetine (CYMBALTA) 60 MG capsule Take 1 capsule (60 mg total) by mouth 2 (two) times daily. Take 1 capsule (30 mg) at bedtime for 1 week, if no side effects, then increase to 2 capsules (60 mg) thereafter. 60 capsule 11   lidocaine (LMX) 4 % cream Apply topically 2 (two) times daily as needed (pain). 30 g 0   losartan (COZAAR) 100 MG tablet Take 100 mg by mouth daily.  6   Menthol-Methyl Salicylate (SALONPAS PAIN RELIEF PATCH  EX) Apply 1 patch topically daily as needed (pain).     methocarbamol (ROBAXIN) 750 MG tablet Take 1 tablet (750 mg total) by mouth every 8 (eight) hours as needed for muscle spasms. 20 tablet 0   Nystatin (GERHARDT'S BUTT CREAM) CREA Apply 1 Application topically 2 (two) times daily. (Patient taking differently: Apply 1 Application topically 2 (two) times daily as needed for irritation.) 30 each 0   nystatin (MYCOSTATIN) 100000 UNIT/ML suspension Take 5 mLs (500,000 Units total) by mouth 4 (four) times daily as needed. Swish and swallow 60 mL 0   nystatin (MYCOSTATIN/NYSTOP) powder Apply 1 Application topically 3 (three) times daily. (Patient taking differently: Apply 1 Application topically 3 (three) times daily as needed (redness, rash).) 15 g 0   ondansetron (ZOFRAN) 8 MG tablet Take 1 tablet (8 mg total) by mouth every 8 (eight) hours as needed for nausea or vomiting. 30 tablet 1   pantoprazole (PROTONIX) 40 MG  tablet Take 1 tablet (40 mg total) by mouth daily. (Patient taking differently: Take 40 mg by mouth daily as needed (for acid reflux).) 30 tablet 0   prochlorperazine (COMPAZINE) 10 MG tablet Take 1 tablet (10 mg total) by mouth every 6 (six) hours as needed for nausea or vomiting. 30 tablet 1   diclofenac (VOLTAREN) 75 MG EC tablet Take 75 mg by mouth 2 (two) times daily as needed for mild pain.     diphenoxylate-atropine (LOMOTIL) 2.5-0.025 MG tablet Take 2 tablets by mouth 4 (four) times daily as needed for diarrhea or loose stools. 30 tablet 0   No current facility-administered medications for this visit.   Facility-Administered Medications Ordered in Other Visits  Medication Dose Route Frequency Provider Last Rate Last Admin   acetaminophen (TYLENOL) tablet 650 mg  650 mg Oral Once [name], MD       belinostat [facility]) 2,000 mg in sodium chloride 0.9 % 250 mL chemo infusion  1,000 mg/m2 (Treatment Plan Recorded) Intravenous Once [name], MD       heparin lock flush 100 unit/mL  500 Units Intracatheter Once PRN [name], MD       LORazepam (ATIVAN) tablet 0.5 mg  0.5 mg Oral Once [name], MD       prochlorperazine (COMPAZINE) tablet 10 mg  10 mg Oral Once [name], MD       sodium chloride flush (NS) 0.9 % injection 10 mL  10 mL Intracatheter PRN [name], MD        PHYSICAL EXAMINATION: .BP (!) 166/92 (BP Location: Left Arm, Patient Position: Sitting) Comment: Nurse was notified  Pulse 81   Temp (!) 97.5 F (36.4 C) (Temporal)   Resp 16   Wt 197 lb 11.2 oz (89.7 kg)   SpO2 95%   BMI 32.90 kg/m   GENERAL:alert, in no acute distress and comfortable SKIN: no acute rashes, no significant lesions EYES: conjunctiva are pink and non-injected, sclera anicteric NECK: supple, no JVD LYMPH:  palpable bilateral cervical and submandibular lymphadenopathy LUNGS: clear to auscultation b/l with normal respiratory  effort HEART: regular rate & rhythm Extremity: no pedal edema PSYCH: alert & oriented x 3 with fluent speech NEURO: no focal motor/sensory deficits   LABORATORY DATA:  I have reviewed the data as listed    Latest Ref Rng & Units 03/24/2023   12:10 PM 03/07/2023    1:46 PM  CBC  WBC 4.0 - 10.5 K/uL 3.3  3.1   Hemoglobin 12.0 -  15.0 g/dL 60.4  54.0   Hematocrit 36.0 - 46.0 % 35.9  33.8   Platelets 150 - 400 K/uL 172  168   .    Latest Ref Rng & Units 03/24/2023   12:10 PM 03/07/2023    1:46 PM  CMP  Glucose 70 - 99 mg/dL 74  981   BUN 8 - 23 mg/dL 20  13   Creatinine 1.91 - 1.00 mg/dL 4.78  2.95   Sodium 621 - 145 mmol/L 142  142   Potassium 3.5 - 5.1 mmol/L 3.7  3.4   Chloride 98 - 111 mmol/L 109  110   CO2 22 - 32 mmol/L 27  27   Calcium 8.9 - 10.3 mg/dL 9.2  9.3   Total Protein 6.5 - 8.1 g/dL 7.1  6.4   Total Bilirubin 0.3 - 1.2 mg/dL 0.4  0.5   Alkaline Phos 38 - 126 U/L 98  81   AST 15 - 41 U/L 19  16   ALT 0 - 44 U/L 14  9       Legend: (L) Low (H) High  . Lab Results  Component Value Date   LDH 217 (H) 03/07/2023     01/21/2023 Needle Core Biopsy:           RADIOGRAPHIC STUDIES: .CT HEAD WO CONTRAST  Result Date: 03/28/2023 CLINICAL DATA:  Vomiting headache fall EXAM: CT HEAD WITHOUT CONTRAST CT CERVICAL SPINE WITHOUT CONTRAST TECHNIQUE: Multidetector CT imaging of the head and cervical spine was performed following the standard protocol without intravenous contrast. Multiplanar CT image reconstructions of the cervical spine were also generated. RADIATION DOSE REDUCTION: This exam was performed according to the departmental dose-optimization program which includes automated exposure control, adjustment of the mA and/or kV according to patient size and/or use of iterative reconstruction technique. COMPARISON:  CT brain 12/28/2022 FINDINGS: CT HEAD FINDINGS Brain: No evidence of acute infarction, hemorrhage, hydrocephalus, extra-axial collection or mass  lesion/mass effect. Vascular: No hyperdense vessel or unexpected calcification. Skull: Normal. Negative for fracture or focal lesion. Sinuses/Orbits: No acute finding. Other: None CT CERVICAL SPINE FINDINGS Alignment: Straightening of the cervical spine. No subluxation. Facet alignment within normal limits Skull base and vertebrae: No acute fracture. No primary bone lesion or focal pathologic process. Soft tissues and spinal canal: No prevertebral fluid or swelling. No visible canal hematoma. Disc levels: Moderate diffuse disc space narrowing and degenerative change C3 through C7. Facet degenerative changes at multiple levels with foraminal narrowing Upper chest: Negative. Other: None IMPRESSION: 1. Negative non contrasted CT appearance of the brain. 2. Straightening of the cervical spine with degenerative changes. No acute osseous abnormality. Electronically Signed   By: Jasmine Pang M.D.   On: 03/28/2023 18:19   CT CERVICAL SPINE WO CONTRAST  Result Date: 03/28/2023 CLINICAL DATA:  Vomiting headache fall EXAM: CT HEAD WITHOUT CONTRAST CT CERVICAL SPINE WITHOUT CONTRAST TECHNIQUE: Multidetector CT imaging of the head and cervical spine was performed following the standard protocol without intravenous contrast. Multiplanar CT image reconstructions of the cervical spine were also generated. RADIATION DOSE REDUCTION: This exam was performed according to the departmental dose-optimization program which includes automated exposure control, adjustment of the mA and/or kV according to patient size and/or use of iterative reconstruction technique. COMPARISON:  CT brain 12/28/2022 FINDINGS: CT HEAD FINDINGS Brain: No evidence of acute infarction, hemorrhage, hydrocephalus, extra-axial collection or mass lesion/mass effect. Vascular: No hyperdense vessel or unexpected calcification. Skull: Normal. Negative for fracture or focal lesion. Sinuses/Orbits: No  acute finding. Other: None CT CERVICAL SPINE FINDINGS Alignment:  Straightening of the cervical spine. No subluxation. Facet alignment within normal limits Skull base and vertebrae: No acute fracture. No primary bone lesion or focal pathologic process. Soft tissues and spinal canal: No prevertebral fluid or swelling. No visible canal hematoma. Disc levels: Moderate diffuse disc space narrowing and degenerative change C3 through C7. Facet degenerative changes at multiple levels with foraminal narrowing Upper chest: Negative. Other: None IMPRESSION: 1. Negative non contrasted CT appearance of the brain. 2. Straightening of the cervical spine with degenerative changes. No acute osseous abnormality. Electronically Signed   By: Jasmine Pang M.D.   On: 03/28/2023 18:19   DG Chest Port 1 View  Result Date: 03/28/2023 CLINICAL DATA:  Fall lightheaded EXAM: PORTABLE CHEST 1 VIEW COMPARISON:  12/28/2022 FINDINGS: Right-sided central venous port tip over the cavoatrial region. No acute airspace disease or effusion. Stable cardiomediastinal silhouette. No pneumothorax. Postsurgical changes over the axilla. Right shoulder arthroplasty. IMPRESSION: No active disease. Electronically Signed   By: Jasmine Pang M.D.   On: 03/28/2023 17:16     ASSESSMENT & PLAN:  Krisley Hewgley is a 72 y.o. female who presents to the clinic for evaluation of stage IV peripheral T-cell lymphoma.   #Relapsed 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 -Results of excisional lymph node biopsy showing peripheral T-cell lymphoma not otherwise specified which is CD30 negative were discussed in detail with the patient. -PET CT scan from 06/19/2022 showed at least stage II disease but depending on the liver lesion etiology this could represent stage IV disease. -She has had anthracycline exposure as a part of her [facility] regimen for previous treatment of breast cancer and is therefore limited in her total lifetime anthracycline use. -CD 30 negative status precludes use of Adcentris. -Received  07/08/2022-10/23/2022 of CEOP -PET scan from 12/13/2022 showed recurrent hypermetabolic cervical lymphadenopathy bilaterally, as detailed above indicative of recurrent disease ([place] 5). -Started Cycle 1, Day 1 of Belinostat on 02/24/2023.   #H/O Anal Squamous Cell Carcinoma, cT2N0M0 -Diagnosed in September 2020.Workup showed a 3 cm mass at the anorectal junction, biopsy confirmed well to moderately differentiated squamous cell carcinoma. 06/21/19 PET scan showed no metastasis.  -S/p concurrent chemoRT with Mitomycin and 5FU on 08/09/19. Now on surveillance. -Currently in remission   #H/o bilateral breast cancer, s/p mastectomies  -2007: s/p right lumpectomy for ADH in 08/2006 showed DCIS ER/PR+, grade 1. Completed tamoxifen 09/2007 - 09/2012 -2008: stage pT2N0 left breast invasive ductal carcinoma, triple negative, grade 3; s/p lumpectomy in 07/2007 and bilateral mastectomy 11/2007 (path negative for residual carcinoma in both breasts); s/p adjuvant chemo AC-T and radiation  PLAN: -Due for Cycle 3, Day 1 of belinostat today -Labs from today were reviewed and adequate for treatment. WBC 3.1, Hgb 10.6, Plt 224, Creatinine 1.02, LFTs normal. -Proceed with treatment today without any dose modifications -Will obtain mid treatment PET scan to evaluate for progressive disease due to worsening cervical lymphadenopathy patient is reporting.    FOLLOW UP: Per integrated scheduling PET scan in 1 week  All of the patient's questions were answered with apparent satisfaction. The patient knows to call the clinic with any problems, questions or concerns.  I have spent a total of 30 minutes minutes of face-to-face and non-face-to-face time, preparing to see the patient, performing a medically appropriate examination, counseling and educating the patient, ordering tests/procedures,  documenting clinical information in the electronic health record, independently interpreting results and communicating results  to the patient,  and care coordination.   Georga Kaufmann PA-C Dept of Hematology and Oncology [facility] at [facility] Phone: [phone]

## 2023-04-21 NOTE — Patient Instructions (Signed)
[facility]  Discharge Instructions: Thank you for choosing [facility] to provide your oncology and hematology care.   If you have a lab appointment with the Cancer Center, please go directly to the Cancer Center and check in at the registration area.   Wear comfortable clothing and clothing appropriate for easy access to any Portacath or PICC line.   We strive to give you quality time with your provider. You may need to reschedule your appointment if you arrive late (15 or more minutes).  Arriving late affects you and other patients whose appointments are after yours.  Also, if you miss three or more appointments without notifying the office, you may be dismissed from the clinic at the provider's discretion.      For prescription refill requests, have your pharmacy contact our office and allow 72 hours for refills to be completed.    Today you received the following chemotherapy and/or immunotherapy agent: Belinostat   To help prevent nausea and vomiting after your treatment, we encourage you to take your nausea medication as directed.  BELOW ARE SYMPTOMS THAT SHOULD BE REPORTED IMMEDIATELY: *FEVER GREATER THAN 100.4 F (38 C) OR HIGHER *CHILLS OR SWEATING *NAUSEA AND VOMITING THAT IS NOT CONTROLLED WITH YOUR NAUSEA MEDICATION *UNUSUAL SHORTNESS OF BREATH *UNUSUAL BRUISING OR BLEEDING *URINARY PROBLEMS (pain or burning when urinating, or frequent urination) *BOWEL PROBLEMS (unusual diarrhea, constipation, pain near the anus) TENDERNESS IN MOUTH AND THROAT WITH OR WITHOUT PRESENCE OF ULCERS (sore throat, sores in mouth, or a toothache) UNUSUAL RASH, SWELLING OR PAIN  UNUSUAL VAGINAL DISCHARGE OR ITCHING   Items with * indicate a potential emergency and should be followed up as soon as possible or go to the Emergency Department if any problems should occur.  Please show the CHEMOTHERAPY ALERT CARD or IMMUNOTHERAPY ALERT CARD at check-in  to the Emergency Department and triage nurse.  Should you have questions after your visit or need to cancel or reschedule your appointment, please contact [facility]  Dept: [phone]  and follow the prompts.  Office hours are 8:00 a.m. to 4:30 p.m. Monday - Friday. Please note that voicemails left after 4:00 p.m. may not be returned until the following business day.  We are closed weekends and major holidays. You have access to a nurse at all times for urgent questions. Please call the main number to the clinic Dept: [phone] and follow the prompts.   For any non-urgent questions, you may also contact your provider using MyChart. We now offer e-Visits for anyone 18 and older to request care online for non-urgent symptoms. For details visit [email or web address].   Also download the MyChart app! Go to the app store, search "MyChart", open the app, select [facility], and log in with your MyChart username and password.  Belinostat Injection What is this medication? BELINOSTAT (beh LIH noh STAT) treats lymphoma. It works by slowing down the growth of cancer cells. This medicine may be used for other purposes; ask your health care provider or pharmacist if you have questions. COMMON BRAND NAME(S): Beleodaq What should I tell my care team before I take this medication? They need to know if you have any of these conditions: Carry the UGT1A1*28 gene Infection Kidney disease Liver disease Low blood cell levels (white cells, platelets, or red blood cells) An unusual or allergic reaction to belinostat, other medications, foods, dyes, or preservatives If you or your partner are pregnant   or trying to get pregnant Breast-feeding How should I use this medication? This medication is infused into a vein. It is given by your care team in a hospital or clinic setting. Talk to your care team about the use of this medication in children. Special care may be  needed. Overdosage: If you think you have taken too much of this medicine contact a poison control center or emergency room at once. NOTE: This medicine is only for you. Do not share this medicine with others. What if I miss a dose? Keep appointments for follow-up doses. It is important not to miss your dose. Call your care team if you are unable to keep an appointment. What may interact with this medication? Do not take this medication with any of the following: Atazanavir This medication may affect how other medications work, and other medications may affect the way this medication works. Talk with your care team about all of the medications you take. They may suggest changes to your treatment plan to lower the risk of side effects and to make sure your medications work as intended. This list may not describe all possible interactions. Give your health care provider a list of all the medicines, herbs, non-prescription drugs, or dietary supplements you use. Also tell them if you smoke, drink alcohol, or use illegal drugs. Some items may interact with your medicine. What should I watch for while using this medication? Your condition will be monitored carefully while you are receiving this medication. This medication may make you feel generally unwell. This is not uncommon as chemotherapy can affect healthy cells as well as cancer cells. Report any side effects. Continue your course of treatment even though you feel ill unless your care team tells you to stop. You may need blood work while taking this medication. This medication may increase your risk to bruise or bleed. Call your care team if you notice any unusual bleeding. This medication may increase your risk of getting an infection. Call your care team for advice if you get a fever, chills, sore throat, or other symptoms of a cold or flu. Do not treat yourself. Try to avoid being around people who are sick. Call your care team if you are around  anyone with measles, chickenpox, or if you develop sores or blisters that do not heal properly. Be careful brushing or flossing your teeth or using a toothpick because you may get an infection or bleed more easily. If you have any dental work done, tell your dentist you are receiving this medication. Talk to your care team if you or your partner wish to become pregnant or think either of you might be pregnant. This medication can cause serious birth defects if taken during pregnancy or for 6 months after stopping treatment. A reliable form of contraception is recommended while taking this medication and for 6 months after stopping treatment. Talk to your care team about effective forms of contraception. Use a condom during sex and for 3 months after stopping treatment. Tell your care team right away if you think your partner might be pregnant. This medication can cause serious birth defects. This medication may cause infertility. Talk to your care team if you are concerned about your fertility. Do not breast-feed while taking this medication and for 2 weeks after stopping therapy. What side effects may I notice from receiving this medication? Side effects that you should report to your care team as soon as possible: Allergic reactions--skin rash, itching, hives, swelling of the   face, lips, tongue, or throat Infection--fever, chills, cough, sore throat, wounds that don't heal, pain or trouble when passing urine, general feeling of discomfort or being unwell Liver injury--right upper belly pain, loss of appetite, nausea, light-colored stool, dark yellow or brown urine, yellowing skin or eyes, unusual weakness or fatigue Low red blood cell level--unusual weakness or fatigue, dizziness, headache, trouble breathing Tumor lysis syndrome (TLS)--nausea, vomiting, diarrhea, decrease in the amount of urine, dark urine, unusual weakness or fatigue, confusion, muscle pain or cramps, fast or irregular heartbeat,  joint pain Unusual bruising or bleeding Side effects that usually do not require medical attention (report to your care team if they continue or are bothersome): Diarrhea Fatigue Nausea Vomiting This list may not describe all possible side effects. Call your doctor for medical advice about side effects. You may report side effects to FDA at 1-800-FDA-1088. Where should I keep my medication? This medication is given in a hospital or clinic. It will not be stored at home. NOTE: This sheet is a summary. It may not cover all possible information. If you have questions about this medicine, talk to your doctor, pharmacist, or health care provider.  2024 Elsevier/Gold Standard (2021-11-28 00:00:00)   

## 2023-04-22 ENCOUNTER — Inpatient Hospital Stay: Payer: PPO

## 2023-04-22 ENCOUNTER — Encounter: Payer: Self-pay | Admitting: Hematology

## 2023-04-22 NOTE — Progress Notes (Signed)
Request for medical records received from [facility] for Patient's Disability claim. Request forwarded to [facility] Information Management with signed Release of Information form.

## 2023-04-23 ENCOUNTER — Inpatient Hospital Stay: Payer: PPO

## 2023-04-23 ENCOUNTER — Other Ambulatory Visit: Payer: Self-pay

## 2023-04-23 VITALS — BP 116/78 | HR 88 | Temp 98.1°F | Resp 17

## 2023-04-23 DIAGNOSIS — Z5111 Encounter for antineoplastic chemotherapy: Secondary | ICD-10-CM | POA: Diagnosis not present

## 2023-04-23 DIAGNOSIS — C8448 Peripheral T-cell lymphoma, not classified, lymph nodes of multiple sites: Secondary | ICD-10-CM

## 2023-04-23 MED ORDER — LORAZEPAM 1 MG PO TABS
0.5000 mg | ORAL_TABLET | Freq: Once | ORAL | Status: AC
  Administered 2023-04-23: 0.5 mg via ORAL
  Filled 2023-04-23: qty 1

## 2023-04-23 MED ORDER — SODIUM CHLORIDE 0.9 % IV SOLN
1000.0000 mg/m2 | Freq: Once | INTRAVENOUS | Status: AC
  Administered 2023-04-23: 2000 mg via INTRAVENOUS
  Filled 2023-04-23: qty 40

## 2023-04-23 MED ORDER — SODIUM CHLORIDE 0.9 % IV SOLN: Freq: Once | INTRAVENOUS | Status: AC

## 2023-04-23 MED ORDER — PROCHLORPERAZINE MALEATE 10 MG PO TABS
10.0000 mg | ORAL_TABLET | Freq: Once | ORAL | Status: AC
  Administered 2023-04-23: 10 mg via ORAL
  Filled 2023-04-23: qty 1

## 2023-04-23 MED ORDER — HEPARIN SOD (PORK) LOCK FLUSH 100 UNIT/ML IV SOLN
500.0000 [IU] | Freq: Once | INTRAVENOUS | Status: AC | PRN
  Administered 2023-04-23: 500 [IU]

## 2023-04-23 MED ORDER — ACETAMINOPHEN 325 MG PO TABS
650.0000 mg | ORAL_TABLET | Freq: Once | ORAL | Status: AC
  Administered 2023-04-23: 650 mg via ORAL
  Filled 2023-04-23: qty 2

## 2023-04-23 MED ORDER — SODIUM CHLORIDE 0.9% FLUSH
10.0000 mL | INTRAVENOUS | Status: DC | PRN
  Administered 2023-04-23: 10 mL

## 2023-04-23 NOTE — Patient Instructions (Addendum)
[place] [facility]  Discharge Instructions: Thank you for choosing [facility] to provide your oncology and hematology care.   If you have a lab appointment with the Cancer Center, please go directly to the Cancer Center and check in at the registration area.   Wear comfortable clothing and clothing appropriate for easy access to any Portacath or PICC line.   We strive to give you quality time with your provider. You may need to reschedule your appointment if you arrive late (15 or more minutes).  Arriving late affects you and other patients whose appointments are after yours.  Also, if you miss three or more appointments without notifying the office, you may be dismissed from the clinic at the provider's discretion.      For prescription refill requests, have your pharmacy contact our office and allow 72 hours for refills to be completed.    Today you received the following chemotherapy and/or immunotherapy agents : Belinostat      To help prevent nausea and vomiting after your treatment, we encourage you to take your nausea medication as directed.  BELOW ARE SYMPTOMS THAT SHOULD BE REPORTED IMMEDIATELY: *FEVER GREATER THAN 100.4 F (38 C) OR HIGHER *CHILLS OR SWEATING *NAUSEA AND VOMITING THAT IS NOT CONTROLLED WITH YOUR NAUSEA MEDICATION *UNUSUAL SHORTNESS OF BREATH *UNUSUAL BRUISING OR BLEEDING *URINARY PROBLEMS (pain or burning when urinating, or frequent urination) *BOWEL PROBLEMS (unusual diarrhea, constipation, pain near the anus) TENDERNESS IN MOUTH AND THROAT WITH OR WITHOUT PRESENCE OF ULCERS (sore throat, sores in mouth, or a toothache) UNUSUAL RASH, SWELLING OR PAIN  UNUSUAL VAGINAL DISCHARGE OR ITCHING   Items with * indicate a potential emergency and should be followed up as soon as possible or go to the Emergency Department if any problems should occur.  Please show the CHEMOTHERAPY ALERT CARD or IMMUNOTHERAPY ALERT CARD at  check-in to the Emergency Department and triage nurse.  Should you have questions after your visit or need to cancel or reschedule your appointment, please contact [place] [facility]  Dept: [phone]  and follow the prompts.  Office hours are 8:00 a.m. to 4:30 p.m. Monday - Friday. Please note that voicemails left after 4:00 p.m. may not be returned until the following business day.  We are closed weekends and major holidays. You have access to a nurse at all times for urgent questions. Please call the main number to the clinic Dept: [phone] and follow the prompts.   For any non-urgent questions, you may also contact your provider using MyChart. We now offer e-Visits for anyone 52 and older to request care online for non-urgent symptoms. For details visit [email or web address].   Also download the MyChart app! Go to the app store, search "MyChart", open the app, select , and log in with your MyChart username and password.

## 2023-04-24 ENCOUNTER — Inpatient Hospital Stay: Payer: PPO

## 2023-04-24 ENCOUNTER — Other Ambulatory Visit: Payer: Self-pay

## 2023-04-24 VITALS — BP 140/84 | HR 91 | Temp 98.7°F | Resp 16

## 2023-04-24 DIAGNOSIS — Z5111 Encounter for antineoplastic chemotherapy: Secondary | ICD-10-CM | POA: Diagnosis not present

## 2023-04-24 DIAGNOSIS — C8448 Peripheral T-cell lymphoma, not classified, lymph nodes of multiple sites: Secondary | ICD-10-CM

## 2023-04-24 MED ORDER — SODIUM CHLORIDE 0.9% FLUSH
10.0000 mL | INTRAVENOUS | Status: DC | PRN
  Administered 2023-04-24: 10 mL

## 2023-04-24 MED ORDER — SODIUM CHLORIDE 0.9 % IV SOLN: Freq: Once | INTRAVENOUS | Status: AC

## 2023-04-24 MED ORDER — SODIUM CHLORIDE 0.9 % IV SOLN
1000.0000 mg/m2 | Freq: Once | INTRAVENOUS | Status: AC
  Administered 2023-04-24: 2000 mg via INTRAVENOUS
  Filled 2023-04-24: qty 40

## 2023-04-24 MED ORDER — LORAZEPAM 1 MG PO TABS
0.5000 mg | ORAL_TABLET | Freq: Once | ORAL | Status: AC
  Administered 2023-04-24: 0.5 mg via ORAL
  Filled 2023-04-24: qty 1

## 2023-04-24 MED ORDER — PROCHLORPERAZINE MALEATE 10 MG PO TABS
10.0000 mg | ORAL_TABLET | Freq: Once | ORAL | Status: AC
  Administered 2023-04-24: 10 mg via ORAL
  Filled 2023-04-24: qty 1

## 2023-04-24 MED ORDER — HEPARIN SOD (PORK) LOCK FLUSH 100 UNIT/ML IV SOLN
500.0000 [IU] | Freq: Once | INTRAVENOUS | Status: AC | PRN
  Administered 2023-04-24: 500 [IU]

## 2023-04-24 MED ORDER — ACETAMINOPHEN 325 MG PO TABS
650.0000 mg | ORAL_TABLET | Freq: Once | ORAL | Status: AC
  Administered 2023-04-24: 650 mg via ORAL
  Filled 2023-04-24: qty 2

## 2023-04-24 NOTE — Patient Instructions (Addendum)
[place] [facility]  Discharge Instructions: Thank you for choosing [facility] to provide your oncology and hematology care.   If you have a lab appointment with the Cancer Center, please go directly to the Cancer Center and check in at the registration area.   Wear comfortable clothing and clothing appropriate for easy access to any Portacath or PICC line.   We strive to give you quality time with your provider. You may need to reschedule your appointment if you arrive late (15 or more minutes).  Arriving late affects you and other patients whose appointments are after yours.  Also, if you miss three or more appointments without notifying the office, you may be dismissed from the clinic at the provider's discretion.      For prescription refill requests, have your pharmacy contact our office and allow 72 hours for refills to be completed.    Today you received the following chemotherapy and/or immunotherapy agent: Belinostat   To help prevent nausea and vomiting after your treatment, we encourage you to take your nausea medication as directed.  BELOW ARE SYMPTOMS THAT SHOULD BE REPORTED IMMEDIATELY: *FEVER GREATER THAN 100.4 F (38 C) OR HIGHER *CHILLS OR SWEATING *NAUSEA AND VOMITING THAT IS NOT CONTROLLED WITH YOUR NAUSEA MEDICATION *UNUSUAL SHORTNESS OF BREATH *UNUSUAL BRUISING OR BLEEDING *URINARY PROBLEMS (pain or burning when urinating, or frequent urination) *BOWEL PROBLEMS (unusual diarrhea, constipation, pain near the anus) TENDERNESS IN MOUTH AND THROAT WITH OR WITHOUT PRESENCE OF ULCERS (sore throat, sores in mouth, or a toothache) UNUSUAL RASH, SWELLING OR PAIN  UNUSUAL VAGINAL DISCHARGE OR ITCHING   Items with * indicate a potential emergency and should be followed up as soon as possible or go to the Emergency Department if any problems should occur.  Please show the CHEMOTHERAPY ALERT CARD or IMMUNOTHERAPY ALERT CARD at check-in  to the Emergency Department and triage nurse.  Should you have questions after your visit or need to cancel or reschedule your appointment, please contact [place] [facility]  Dept: [phone]  and follow the prompts.  Office hours are 8:00 a.m. to 4:30 p.m. Monday - Friday. Please note that voicemails left after 4:00 p.m. may not be returned until the following business day.  We are closed weekends and major holidays. You have access to a nurse at all times for urgent questions. Please call the main number to the clinic Dept: [phone] and follow the prompts.   For any non-urgent questions, you may also contact your provider using MyChart. We now offer e-Visits for anyone 18 and older to request care online for non-urgent symptoms. For details visit [email or web address].   Also download the MyChart app! Go to the app store, search "MyChart", open the app, select [facility], and log in with your MyChart username and password.  Belinostat Injection What is this medication? BELINOSTAT (beh LIH noh STAT) treats lymphoma. It works by slowing down the growth of cancer cells. This medicine may be used for other purposes; ask your health care provider or pharmacist if you have questions. COMMON BRAND NAME(S): Beleodaq What should I tell my care team before I take this medication? They need to know if you have any of these conditions: Carry the UGT1A1*28 gene Infection Kidney disease Liver disease Low blood cell levels (white cells, platelets, or red blood cells) An unusual or allergic reaction to belinostat, other medications, foods, dyes, or preservatives If you or your partner are pregnant   or trying to get pregnant Breast-feeding How should I use this medication? This medication is infused into a vein. It is given by your care team in a hospital or clinic setting. Talk to your care team about the use of this medication in children. Special care may be  needed. Overdosage: If you think you have taken too much of this medicine contact a poison control center or emergency room at once. NOTE: This medicine is only for you. Do not share this medicine with others. What if I miss a dose? Keep appointments for follow-up doses. It is important not to miss your dose. Call your care team if you are unable to keep an appointment. What may interact with this medication? Do not take this medication with any of the following: Atazanavir This medication may affect how other medications work, and other medications may affect the way this medication works. Talk with your care team about all of the medications you take. They may suggest changes to your treatment plan to lower the risk of side effects and to make sure your medications work as intended. This list may not describe all possible interactions. Give your health care provider a list of all the medicines, herbs, non-prescription drugs, or dietary supplements you use. Also tell them if you smoke, drink alcohol, or use illegal drugs. Some items may interact with your medicine. What should I watch for while using this medication? Your condition will be monitored carefully while you are receiving this medication. This medication may make you feel generally unwell. This is not uncommon as chemotherapy can affect healthy cells as well as cancer cells. Report any side effects. Continue your course of treatment even though you feel ill unless your care team tells you to stop. You may need blood work while taking this medication. This medication may increase your risk to bruise or bleed. Call your care team if you notice any unusual bleeding. This medication may increase your risk of getting an infection. Call your care team for advice if you get a fever, chills, sore throat, or other symptoms of a cold or flu. Do not treat yourself. Try to avoid being around people who are sick. Call your care team if you are around  anyone with measles, chickenpox, or if you develop sores or blisters that do not heal properly. Be careful brushing or flossing your teeth or using a toothpick because you may get an infection or bleed more easily. If you have any dental work done, tell your dentist you are receiving this medication. Talk to your care team if you or your partner wish to become pregnant or think either of you might be pregnant. This medication can cause serious birth defects if taken during pregnancy or for 6 months after stopping treatment. A reliable form of contraception is recommended while taking this medication and for 6 months after stopping treatment. Talk to your care team about effective forms of contraception. Use a condom during sex and for 3 months after stopping treatment. Tell your care team right away if you think your partner might be pregnant. This medication can cause serious birth defects. This medication may cause infertility. Talk to your care team if you are concerned about your fertility. Do not breast-feed while taking this medication and for 2 weeks after stopping therapy. What side effects may I notice from receiving this medication? Side effects that you should report to your care team as soon as possible: Allergic reactions--skin rash, itching, hives, swelling of the   face, lips, tongue, or throat Infection--fever, chills, cough, sore throat, wounds that don't heal, pain or trouble when passing urine, general feeling of discomfort or being unwell Liver injury--right upper belly pain, loss of appetite, nausea, light-colored stool, dark yellow or brown urine, yellowing skin or eyes, unusual weakness or fatigue Low red blood cell level--unusual weakness or fatigue, dizziness, headache, trouble breathing Tumor lysis syndrome (TLS)--nausea, vomiting, diarrhea, decrease in the amount of urine, dark urine, unusual weakness or fatigue, confusion, muscle pain or cramps, fast or irregular heartbeat,  joint pain Unusual bruising or bleeding Side effects that usually do not require medical attention (report to your care team if they continue or are bothersome): Diarrhea Fatigue Nausea Vomiting This list may not describe all possible side effects. Call your doctor for medical advice about side effects. You may report side effects to FDA at 1-800-FDA-1088. Where should I keep my medication? This medication is given in a hospital or clinic. It will not be stored at home. NOTE: This sheet is a summary. It may not cover all possible information. If you have questions about this medicine, talk to your doctor, pharmacist, or health care provider.  2024 Elsevier/Gold Standard (2021-11-28 00:00:00)   

## 2023-04-24 NOTE — Progress Notes (Signed)
Pt. complained of mild nausea and headache at completion of treatment today. Pre medications had been given. Vital signs stable, instructed pt. to take a dose of Zofran when home and to call for any further issues or concerns. Pt. states she is ready to leave. Left via ambulation, no respiratory distress noted.

## 2023-04-24 NOTE — Progress Notes (Signed)
Pt c/o increase in throat pain since Tx yesterday, especially when swallowing. Also says lymph nodes on throat seem slightly more swollen today. She has not tried swallowing any solid food, but reports fluid intake is still good. VSS. She says she still wants to receive Tx.  Per Dr. Candise Che, OK to treat.

## 2023-04-25 ENCOUNTER — Inpatient Hospital Stay: Payer: PPO

## 2023-04-25 ENCOUNTER — Other Ambulatory Visit: Payer: Self-pay

## 2023-04-25 ENCOUNTER — Other Ambulatory Visit: Payer: Self-pay | Admitting: Hematology

## 2023-04-25 VITALS — BP 128/75 | HR 80 | Temp 98.3°F | Resp 18

## 2023-04-25 DIAGNOSIS — C8448 Peripheral T-cell lymphoma, not classified, lymph nodes of multiple sites: Secondary | ICD-10-CM

## 2023-04-25 DIAGNOSIS — Z170 Estrogen receptor positive status [ER+]: Secondary | ICD-10-CM

## 2023-04-25 DIAGNOSIS — C50411 Malignant neoplasm of upper-outer quadrant of right female breast: Secondary | ICD-10-CM

## 2023-04-25 DIAGNOSIS — Z95828 Presence of other vascular implants and grafts: Secondary | ICD-10-CM

## 2023-04-25 DIAGNOSIS — Z5111 Encounter for antineoplastic chemotherapy: Secondary | ICD-10-CM | POA: Diagnosis not present

## 2023-04-25 MED ORDER — ACETAMINOPHEN 325 MG PO TABS
650.0000 mg | ORAL_TABLET | Freq: Once | ORAL | Status: AC
  Administered 2023-04-25: 650 mg via ORAL
  Filled 2023-04-25: qty 2

## 2023-04-25 MED ORDER — SODIUM CHLORIDE 0.9 % IV SOLN
1000.0000 mg/m2 | Freq: Once | INTRAVENOUS | Status: AC
  Administered 2023-04-25: 2000 mg via INTRAVENOUS
  Filled 2023-04-25: qty 40

## 2023-04-25 MED ORDER — HEPARIN SOD (PORK) LOCK FLUSH 100 UNIT/ML IV SOLN
500.0000 [IU] | Freq: Once | INTRAVENOUS | Status: AC
  Administered 2023-04-25: 500 [IU]

## 2023-04-25 MED ORDER — ONDANSETRON HCL 4 MG/2ML IJ SOLN
4.0000 mg | Freq: Once | INTRAMUSCULAR | Status: AC
  Administered 2023-04-25: 4 mg via INTRAVENOUS
  Filled 2023-04-25: qty 2

## 2023-04-25 MED ORDER — PROCHLORPERAZINE MALEATE 10 MG PO TABS
10.0000 mg | ORAL_TABLET | Freq: Once | ORAL | Status: AC
  Administered 2023-04-25: 10 mg via ORAL
  Filled 2023-04-25: qty 1

## 2023-04-25 MED ORDER — SODIUM CHLORIDE 0.9 % IV SOLN: Freq: Once | INTRAVENOUS | Status: AC

## 2023-04-25 MED ORDER — LORAZEPAM 1 MG PO TABS
0.5000 mg | ORAL_TABLET | Freq: Once | ORAL | Status: AC
  Administered 2023-04-25: 0.5 mg via ORAL
  Filled 2023-04-25: qty 1

## 2023-04-25 MED ORDER — SODIUM CHLORIDE 0.9% FLUSH
10.0000 mL | Freq: Once | INTRAVENOUS | Status: AC
  Administered 2023-04-25: 10 mL

## 2023-04-25 MED FILL — Belinostat For IV Inj 500 MG: INTRAVENOUS | Qty: 40 | Status: AC

## 2023-04-25 NOTE — Progress Notes (Signed)
1545 Pt c/o severe headache and nausea x approx 10 minutes. This is common for pt during her treatment per pt. Reached out to Dr. Arletha Pili response. Pt requested that treatment be paused for 3 minutes, which was done. Spoke w/ Jae Dire, PA in Symptom Management who states she will come to evaluate pt in infusion in a moment.  1638 Pt states she feels "much better" after Tylenol and Zofran; states she feels okay to drive home. Pt ambulates from unit w/o complaint.

## 2023-04-25 NOTE — Patient Instructions (Signed)
[place] [facility]  Discharge Instructions: Thank you for choosing [facility] to provide your oncology and hematology care.   If you have a lab appointment with the Cancer Center, please go directly to the Cancer Center and check in at the registration area.   Wear comfortable clothing and clothing appropriate for easy access to any Portacath or PICC line.   We strive to give you quality time with your provider. You may need to reschedule your appointment if you arrive late (15 or more minutes).  Arriving late affects you and other patients whose appointments are after yours.  Also, if you miss three or more appointments without notifying the office, you may be dismissed from the clinic at the provider's discretion.      For prescription refill requests, have your pharmacy contact our office and allow 72 hours for refills to be completed.    Today you received the following chemotherapy and/or immunotherapy agents : Belinostat      To help prevent nausea and vomiting after your treatment, we encourage you to take your nausea medication as directed.  BELOW ARE SYMPTOMS THAT SHOULD BE REPORTED IMMEDIATELY: *FEVER GREATER THAN 100.4 F (38 C) OR HIGHER *CHILLS OR SWEATING *NAUSEA AND VOMITING THAT IS NOT CONTROLLED WITH YOUR NAUSEA MEDICATION *UNUSUAL SHORTNESS OF BREATH *UNUSUAL BRUISING OR BLEEDING *URINARY PROBLEMS (pain or burning when urinating, or frequent urination) *BOWEL PROBLEMS (unusual diarrhea, constipation, pain near the anus) TENDERNESS IN MOUTH AND THROAT WITH OR WITHOUT PRESENCE OF ULCERS (sore throat, sores in mouth, or a toothache) UNUSUAL RASH, SWELLING OR PAIN  UNUSUAL VAGINAL DISCHARGE OR ITCHING   Items with * indicate a potential emergency and should be followed up as soon as possible or go to the Emergency Department if any problems should occur.  Please show the CHEMOTHERAPY ALERT CARD or IMMUNOTHERAPY ALERT CARD at  check-in to the Emergency Department and triage nurse.  Should you have questions after your visit or need to cancel or reschedule your appointment, please contact [place] [facility]  Dept: [phone]  and follow the prompts.  Office hours are 8:00 a.m. to 4:30 p.m. Monday - Friday. Please note that voicemails left after 4:00 p.m. may not be returned until the following business day.  We are closed weekends and major holidays. You have access to a nurse at all times for urgent questions. Please call the main number to the clinic Dept: [phone] and follow the prompts.   For any non-urgent questions, you may also contact your provider using MyChart. We now offer e-Visits for anyone 52 and older to request care online for non-urgent symptoms. For details visit [email or web address].   Also download the MyChart app! Go to the app store, search "MyChart", open the app, select , and log in with your MyChart username and password.

## 2023-04-26 ENCOUNTER — Inpatient Hospital Stay: Payer: PPO

## 2023-04-26 VITALS — BP 131/82 | HR 80 | Temp 98.4°F | Resp 18

## 2023-04-26 DIAGNOSIS — C8448 Peripheral T-cell lymphoma, not classified, lymph nodes of multiple sites: Secondary | ICD-10-CM

## 2023-04-26 DIAGNOSIS — Z5111 Encounter for antineoplastic chemotherapy: Secondary | ICD-10-CM | POA: Diagnosis not present

## 2023-04-26 MED ORDER — PROCHLORPERAZINE MALEATE 10 MG PO TABS
10.0000 mg | ORAL_TABLET | Freq: Once | ORAL | Status: AC
  Administered 2023-04-26: 10 mg via ORAL
  Filled 2023-04-26: qty 1

## 2023-04-26 MED ORDER — LORAZEPAM 1 MG PO TABS
0.5000 mg | ORAL_TABLET | Freq: Once | ORAL | Status: AC
  Administered 2023-04-26: 0.5 mg via ORAL
  Filled 2023-04-26: qty 1

## 2023-04-26 MED ORDER — SODIUM CHLORIDE 0.9% FLUSH
10.0000 mL | INTRAVENOUS | Status: DC | PRN
  Administered 2023-04-26: 10 mL

## 2023-04-26 MED ORDER — ACETAMINOPHEN 325 MG PO TABS
650.0000 mg | ORAL_TABLET | Freq: Once | ORAL | Status: AC
  Administered 2023-04-26: 650 mg via ORAL
  Filled 2023-04-26: qty 2

## 2023-04-26 MED ORDER — SODIUM CHLORIDE 0.9 % IV SOLN: Freq: Once | INTRAVENOUS | Status: AC

## 2023-04-26 MED ORDER — SODIUM CHLORIDE 0.9 % IV SOLN
1000.0000 mg/m2 | Freq: Once | INTRAVENOUS | Status: AC
  Administered 2023-04-26: 2000 mg via INTRAVENOUS
  Filled 2023-04-26: qty 40

## 2023-04-26 MED ORDER — HEPARIN SOD (PORK) LOCK FLUSH 100 UNIT/ML IV SOLN
500.0000 [IU] | Freq: Once | INTRAVENOUS | Status: AC | PRN
  Administered 2023-04-26: 500 [IU]

## 2023-04-26 NOTE — Patient Instructions (Addendum)
[place] [facility]  Discharge Instructions: Thank you for choosing [facility] to provide your oncology and hematology care.   If you have a lab appointment with the Cancer Center, please go directly to the Cancer Center and check in at the registration area.   Wear comfortable clothing and clothing appropriate for easy access to any Portacath or PICC line.   We strive to give you quality time with your provider. You may need to reschedule your appointment if you arrive late (15 or more minutes).  Arriving late affects you and other patients whose appointments are after yours.  Also, if you miss three or more appointments without notifying the office, you may be dismissed from the clinic at the provider's discretion.      For prescription refill requests, have your pharmacy contact our office and allow 72 hours for refills to be completed.    Today you received the following chemotherapy and/or immunotherapy agents : Belinostat      To help prevent nausea and vomiting after your treatment, we encourage you to take your nausea medication as directed.  BELOW ARE SYMPTOMS THAT SHOULD BE REPORTED IMMEDIATELY: *FEVER GREATER THAN 100.4 F (38 C) OR HIGHER *CHILLS OR SWEATING *NAUSEA AND VOMITING THAT IS NOT CONTROLLED WITH YOUR NAUSEA MEDICATION *UNUSUAL SHORTNESS OF BREATH *UNUSUAL BRUISING OR BLEEDING *URINARY PROBLEMS (pain or burning when urinating, or frequent urination) *BOWEL PROBLEMS (unusual diarrhea, constipation, pain near the anus) TENDERNESS IN MOUTH AND THROAT WITH OR WITHOUT PRESENCE OF ULCERS (sore throat, sores in mouth, or a toothache) UNUSUAL RASH, SWELLING OR PAIN  UNUSUAL VAGINAL DISCHARGE OR ITCHING   Items with * indicate a potential emergency and should be followed up as soon as possible or go to the Emergency Department if any problems should occur.  Please show the CHEMOTHERAPY ALERT CARD or IMMUNOTHERAPY ALERT CARD at  check-in to the Emergency Department and triage nurse.  Should you have questions after your visit or need to cancel or reschedule your appointment, please contact [place] [facility]  Dept: [phone]  and follow the prompts.  Office hours are 8:00 a.m. to 4:30 p.m. Monday - Friday. Please note that voicemails left after 4:00 p.m. may not be returned until the following business day.  We are closed weekends and major holidays. You have access to a nurse at all times for urgent questions. Please call the main number to the clinic Dept: [phone] and follow the prompts.   For any non-urgent questions, you may also contact your provider using MyChart. We now offer e-Visits for anyone 52 and older to request care online for non-urgent symptoms. For details visit [email or web address].   Also download the MyChart app! Go to the app store, search "MyChart", open the app, select , and log in with your MyChart username and password.

## 2023-04-27 ENCOUNTER — Encounter: Payer: Self-pay | Admitting: Physician Assistant

## 2023-04-29 ENCOUNTER — Encounter: Payer: Self-pay | Admitting: Hematology

## 2023-05-01 ENCOUNTER — Encounter: Payer: Self-pay | Admitting: Hematology

## 2023-05-05 ENCOUNTER — Inpatient Hospital Stay: Payer: PPO | Admitting: Hematology

## 2023-05-05 ENCOUNTER — Inpatient Hospital Stay: Payer: PPO

## 2023-05-06 ENCOUNTER — Inpatient Hospital Stay: Payer: PPO

## 2023-05-07 ENCOUNTER — Encounter: Payer: Self-pay | Admitting: Hematology

## 2023-05-07 ENCOUNTER — Inpatient Hospital Stay: Payer: PPO

## 2023-05-08 ENCOUNTER — Inpatient Hospital Stay: Payer: PPO

## 2023-05-09 ENCOUNTER — Encounter (HOSPITAL_COMMUNITY)
Admission: RE | Admit: 2023-05-09 | Discharge: 2023-05-09 | Disposition: A | Discharge status: Home / Self Care | Payer: PPO | Source: Ambulatory Visit | Attending: Physician Assistant | Admitting: Physician Assistant

## 2023-05-09 ENCOUNTER — Encounter: Payer: Self-pay | Admitting: Hematology

## 2023-05-09 ENCOUNTER — Telehealth: Payer: Self-pay

## 2023-05-09 ENCOUNTER — Inpatient Hospital Stay: Payer: PPO

## 2023-05-09 DIAGNOSIS — C8448 Peripheral T-cell lymphoma, not classified, lymph nodes of multiple sites: Secondary | ICD-10-CM | POA: Diagnosis not present

## 2023-05-09 DIAGNOSIS — C8591 Non-Hodgkin lymphoma, unspecified, lymph nodes of head, face, and neck: Secondary | ICD-10-CM | POA: Diagnosis not present

## 2023-05-09 LAB — GLUCOSE, CAPILLARY: Glucose-Capillary: 93 mg/dL (ref 70–99)

## 2023-05-09 MED ORDER — FLUDEOXYGLUCOSE F - 18 (FDG) INJECTION
9.8000 | Freq: Once | INTRAVENOUS | Status: AC
  Administered 2023-05-09: 9.8 via INTRAVENOUS

## 2023-05-09 NOTE — Telephone Encounter (Signed)
Pt contacted regarding MyChart message. Pt stated she does not have anything for pain at home except tylenol. Dr Candise Che will be notified to see of he wants to send something in. Pt to come in next week to review scan done today.

## 2023-05-14 ENCOUNTER — Telehealth: Payer: Self-pay

## 2023-05-14 NOTE — Telephone Encounter (Signed)
Received document requesting records on 04/29/2022. Per [facility] Records request was completed and delivered 05/13/2023. Spoke with [facility] representative to confirm receipt of records, and representative did confirm they had received the records from 04/11/22-04/16/23.

## 2023-05-16 ENCOUNTER — Inpatient Hospital Stay (HOSPITAL_BASED_OUTPATIENT_CLINIC_OR_DEPARTMENT_OTHER): Payer: PPO | Admitting: Hematology

## 2023-05-16 DIAGNOSIS — C8448 Peripheral T-cell lymphoma, not classified, lymph nodes of multiple sites: Secondary | ICD-10-CM

## 2023-05-16 NOTE — Progress Notes (Signed)
[facility]   [phone] [phone]    HEMATOLOGY ONCOLOGY CLINIC VISIT   Patient Care Team: [name], MD as PCP - General (Family Medicine) [name], MD as PCP - Hematology/Oncology (Hematology) [name], MD as Consulting Physician (Gastroenterology) [name], MD as Consulting Physician (Hematology) [name], NP as Nurse Practitioner (Nurse Practitioner) [name], MD as Consulting Physician (Radiation Oncology) [name], MD as Consulting Physician (General Surgery) [name], MD as Attending Physician (Otolaryngology)  Date of Service: 05/16/23    CHIEF COMPLAINT: f/u for evaluation and management of T cell lymphoma.  SUMMARY OF ONCOLOGIC HISTORY: Oncology History Overview Note  Cancer Staging Anal cancer (HCC) Staging form: Anus, AJCC 8th Edition - Clinical stage from 06/28/2019: Stage IIA (cT2, cN0, cM0) - Signed by [name], NP on 06/28/2019  Breast cancer of upper-outer quadrant of left female breast [facility]) Staging form: Breast, AJCC 7th Edition - Clinical stage from 09/07/2013: Stage IIA (T2, N0, cM0) - Signed by [name], MD on 09/07/2013 - Pathologic: Stage IIA (T2, N0, cM0) - Signed by [name], MD on 09/07/2013  Breast cancer of upper-outer quadrant of right female breast [facility]) Staging form: Breast, AJCC 7th Edition - Clinical stage from 09/07/2013: Stage IA (T1a, N0, cM0) - Signed by [name], MD on 09/07/2013 - Pathologic: Stage IA (T1a, N0, cM0) - Signed by [name], MD on 09/07/2013    Breast cancer of upper-outer quadrant of left female breast (HCC)  07/10/2007 Procedure   US biopsy showed invasive ductal carcinoma 0.8cm, Nottingham grade 3   07/30/2007 Surgery   She had left breast lumpectomy and LN biopsy which showed high grade invasive ductal cancer Nottingham grade 3, 2.7 cm, ER/PR/Her2 neu negative   11/24/2007 Surgery   Patient elected for bilateral  mastectomy   09/07/2008 - 03/08/2009 Chemotherapy   dates are approximate: she received adriamycin and cytoxan followed by Taxol   03/08/2009 - 05/08/2009 Radiation Therapy   dates are approximate, she received XRT    07/09/2021 Imaging   CT CAP  IMPRESSION: 1. No evidence to suggest locally recurrent ianorectal neoplasm or metastatic disease in the chest, abdomen or pelvis. 2. Status post bilateral modified radical mastectomy and bilateral axillary lymph node dissection. 3. Three small supraumbilical ventral hernias containing only omental fat. No associated bowel incarceration or obstruction at this time. 4. Aortic atherosclerosis. 5. Additional incidental findings, as above.   Breast cancer of upper-outer quadrant of right female breast (HCC)  07/10/2006 Procedure   stereotactic biopsy showed atypical hyperplasia   10/23/2006 Surgery   right lumpectomy showed invasive ductal ca (Nottingham grade 1)  2mm and DCIS, ER/PR positive Her 2 negative   10/09/2007 - 10/08/2012 Chemotherapy   Patient was placed on Tamoxifen   03/24/2017 Imaging   No acute findings. No evidence of recurrent carcinoma or metastatic disease   07/09/2021 Imaging   CT CAP  IMPRESSION: 1. No evidence to suggest locally recurrent ianorectal neoplasm or metastatic disease in the chest, abdomen or pelvis. 2. Status post bilateral modified radical mastectomy and bilateral axillary lymph node dissection. 3. Three small supraumbilical ventral hernias containing only omental fat. No associated bowel incarceration or obstruction at this time. 4. Aortic atherosclerosis. 5. Additional incidental findings, as above.   Anal cancer (HCC)  06/04/2019 Procedure   Colonoscopy per Dr. [name]: Findings-the digital rectal exam revealed a 3 cm diameter firm rectal mass.  The mass was noncircumferential and located predominantly at  the right bowel wall at the anorectal junction. A nonobstructing mass was found at the  anus and in the rectum    06/04/2019 Initial Biopsy   Follow pathology: Large intestine, rectum biopsy: Invasive well to moderately differentiated squamous cell carcinoma.  No rectal mucosa present.  There is strong diffuse expression of P 16 immunostain.  CDX 2, p63 and mCEA immunostains are also used in the diagnostic work-up of the case.   06/04/2019 Initial Diagnosis   Anal cancer (HCC)   06/21/2019 PET scan   IMPRESSION: 1. Anorectal primary. No hypermetabolic metastatic disease within the chest, abdomen, or pelvis. Perirectal nodes, at least 1 of which is new since 02/26/2018 CT, suspicious based on size and interval development. 2. Mild limitations secondary to beam hardening artifact from bilateral hip arthroplasty. 3.  Aortic Atherosclerosis (ICD10-I70.0).   06/28/2019 Cancer Staging   Staging form: Anus, AJCC 8th Edition - Clinical stage from 06/28/2019: Stage IIA (cT2, cN0, cM0) - Signed by [name], NP on 06/28/2019   06/28/2019 - 07/26/2019 Chemotherapy   concurrent chemoRT with Mitomycin and 5FU on week 1 and week 5 on starting 06/28/19. Last dose on 07/26/19   06/28/2019 - 08/09/2019 Radiation Therapy   concurrent chemoRT with Dr Mitzi Hansen 06/28/19-08/09/19   11/01/2019 PET scan   IMPRESSION: 1. Marked interval decrease in hypermetabolism noted at the level of the anal rectal primary. No evidence for hypermetabolic metastatic disease in the chest, abdomen, or pelvis. The perirectal lymph nodes identified previously have resolved in the interval. 2.  Aortic Atherosclerois (ICD10-170.0)   08/07/2020 Imaging   CT CAP  IMPRESSION: 1. No findings for residual/recurrent anal tumor, regional lymphadenopathy or metastatic disease. 2. Status post cholecystectomy. No biliary dilatation. 3. Small anterior abdominal wall hernia containing fat. 4. Aortic atherosclerosis.   Aortic Atherosclerosis (ICD10-I70.0).     07/09/2021 Imaging   CT CAP  IMPRESSION: 1. No  evidence to suggest locally recurrent ianorectal neoplasm or metastatic disease in the chest, abdomen or pelvis. 2. Status post bilateral modified radical mastectomy and bilateral axillary lymph node dissection. 3. Three small supraumbilical ventral hernias containing only omental fat. No associated bowel incarceration or obstruction at this time. 4. Aortic atherosclerosis. 5. Additional incidental findings, as above.   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 Mosetta Putt for evaluation and management of newly diagnosed T-cell non-Hodgkin's lymphoma.  Patient has a history of bilateral breast cancer status post mastectomies.  -2007: s/p right lumpectomy for ADH in 08/2006 showed DCIS ER/PR+, grade 1. 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 She was adopted, no known family history    She subsequently was diagnosed with anal squamous cell carcinoma cT2 N0 M0 diagnosed in September 2020.Workup showed a 3 cm mass at the anorectal junction, biopsy confirmed well to moderately differentiated squamous cell carcinoma.  06/21/19 PET scan showed no metastasis.  -S/p concurrent chemoRT with Mitomycin and 5FU on 08/09/19. Now on surveillance. -surveillance CT CAP 07/09/21 was NED. -surveillance colonoscopy done for rectal bleeding on 12/07/21  showed only radiation changes. Her rectal bleeding has resolved.  Recently patient presented to her primary care physician on 05/23/2022 with her new right neck lump which has been progressively enlarging.  She also subsequently developed a left neck swelling.  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  She has had a PET CT scan done on 06/19/2022 which showed hypermetabolic bilateral neck lymphadenopathy.  Also noted to have a new 1 cm focus of metabolic activity in segment 4 of the liver.  This is etiology indeterminate. Lumbar scoliosis and small supraumbilical hernia.  Patient has had Port-A-Cath placement done on 06/25/2022. Echocardiogram was done on 06/17/2022 and shows normal ejection fraction of 70 to 75% with grade 1 diastolic dysfunction.  Right ventricular systolic function within normal limits.  INTERVAL HISTORY  Cynthia Velasquez is a 72 y/o female here for evaluation and management of T-cell non-Hodgkin's lymphoma. Patient was last seen by me on 03/24/2023 and complained of diarrhea, constipation, and right-sided enlarged lymph node.   Patient's daughter is on call during today's visit. Patient complains of diarrhea and vomiting over the last week. She notes that her vomit and stools both appear to be a yellow-mustard paste. She manages symptoms with Imodium.   She endorsed bilateral neck swelling with mild pain. Patient complains of swallowing issues with a lump sensation in her throat. She denies any issues with food consumption or breathing issues.   Patient has low energy levels and her daughter reports that patient's appetite has been low. Patient complains of lower back pain on palpation and denies any leg swelling.  She complains of skin rashes in her bilateral axillaries beginning one month ago. She experiences constant itching. Patient notes that she does not shave  her underarms.  ROS  10 Point review of Systems was done is negative except as noted above.   MEDICAL HISTORY:  Past Medical History:  Diagnosis Date   Anal cancer (HCC) dx'd 05/2019   Anxiety    Arthritis    Bilateral breast cancer (HCC) 10/21/2013   Breast cancer (HCC)    Cancer (HCC)    Depression    Gallstones    GERD (gastroesophageal reflux disease)    doesn't take any meds for this   Headache    h/o migraines       History of bladder infections    History of hiatal hernia    History of migraine    Hyperlipidemia    takes Simvastatin daily   Hypertension    takes Hyzaar   Joint pain    Joint swelling    Lumbar stenosis    Neuropathy 09/07/2013   Pneumonia    Pre-diabetes     SURGICAL HISTORY: Past Surgical History:  Procedure Laterality Date   ABDOMINAL HYSTERECTOMY     bone spur removed from left foot     CHOLECYSTECTOMY     COLONOSCOPY     COLONOSCOPY WITH PROPOFOL Bilateral 12/07/2021   Procedure: COLONOSCOPY WITH PROPOFOL;  Surgeon: Vida Rigger, MD;  Location: [facility];  Service: Endoscopy;  Laterality: Bilateral;   double mastectomy      ESOPHAGOGASTRODUODENOSCOPY N/A 12/07/2021   Procedure: ESOPHAGOGASTRODUODENOSCOPY (EGD);  Surgeon: Vida Rigger, MD;  Location: [facility];  Service: Endoscopy;  Laterality: N/A;   HERNIA REPAIR     umbilical   IR IMAGING GUIDED PORT INSERTION  06/25/2022   right knee arthroscopy     right shoulder arthroscopy     surgery for hiatal hernia     TONSILLECTOMY     TOTAL HIP ARTHROPLASTY Left 02/12/2013   TOTAL HIP ARTHROPLASTY Left 02/12/2013   Procedure: LEFT TOTAL HIP ARTHROPLASTY;  Surgeon: Nestor Lewandowsky, MD;  Location: [facility];  Service: Orthopedics;  Laterality: Left;   TOTAL HIP ARTHROPLASTY Right 05/03/2013   Procedure: TOTAL HIP ARTHROPLASTY;  Surgeon: Nestor Lewandowsky, MD;  Location: [facility] OR;  Service: Orthopedics;  Laterality: Right;   TOTAL KNEE ARTHROPLASTY Right 02/27/2015   Procedure: TOTAL KNEE ARTHROPLASTY;   Surgeon: Gean Birchwood, MD;  Location: [facility] OR;  Service: Orthopedics;  Laterality: Right;   TOTAL SHOULDER ARTHROPLASTY Right 09/12/2016   Procedure: RIGHT TOTAL SHOULDER ARTHROPLASTY;  Surgeon: Jones Broom, MD;  Location: [facility] OR;  Service: Orthopedics;  Laterality: Right;  RIGHT TOTAL SHOULDER ARTHROPLASTY    I have reviewed the social history and family history with the patient and they are unchanged from previous note.  ALLERGIES:  is allergic to tramadol, belinostat, codeine, and vicodin [hydrocodone-acetaminophen].  MEDICATIONS:  Current Outpatient Medications  Medication Sig Dispense Refill   aspirin-acetaminophen-caffeine (EXCEDRIN MIGRAINE) 250-250-65 MG tablet Take 2 tablets by mouth every 6 (six) hours as needed for migraine.     DULoxetine (CYMBALTA) 60 MG capsule Take 1 capsule (60 mg total) by mouth 2 (two) times daily. Take 1 capsule (30 mg) at bedtime for 1 week, if no side effects, then increase to 2 capsules (60 mg) thereafter. 60 capsule 11   lidocaine (LMX) 4 % cream Apply topically 2 (two) times daily as needed (pain). 30 g 0   losartan (COZAAR) 100 MG tablet Take 100 mg by mouth daily.  6   Menthol-Methyl Salicylate (SALONPAS PAIN RELIEF PATCH EX) Apply 1 patch topically daily as needed (pain).     methocarbamol (ROBAXIN) 750 MG tablet Take 1 tablet (750 mg total) by mouth every 8 (eight) hours as needed for muscle spasms. 20 tablet 0   Nystatin (GERHARDT'S BUTT CREAM) CREA Apply 1 Application topically 2 (two) times daily. (Patient taking differently: Apply 1 Application topically 2 (two) times daily as needed for irritation.) 30 each 0   nystatin (MYCOSTATIN) 100000 UNIT/ML suspension Take 5 mLs (500,000 Units total) by mouth 4 (four) times daily as needed. Swish and swallow 60 mL 0   nystatin (MYCOSTATIN/NYSTOP) powder Apply 1 Application topically 3 (three) times daily. (Patient taking differently: Apply 1 Application topically 3 (three) times daily as needed (redness,  rash).) 15 g 0   ondansetron (ZOFRAN) 8 MG tablet Take 1 tablet (8 mg total) by mouth every 8 (eight) hours as needed for nausea or vomiting. 30 tablet 1   pantoprazole (PROTONIX) 40 MG tablet Take 1 tablet (40 mg total) by mouth daily. (Patient taking differently: Take 40 mg by mouth daily as needed (for acid reflux).) 30 tablet 0   prochlorperazine (COMPAZINE) 10 MG tablet Take 1 tablet (10 mg total) by mouth every 6 (six) hours as needed for nausea or vomiting. 30 tablet 1   No current facility-administered medications for this visit.    PHYSICAL EXAMINATION: .There were no vitals taken for this visit.  GENERAL:alert, in no acute distress and comfortable SKIN: scaly somewhat hyperpigment rash in both axillae EYES: conjunctiva are pink and non-injected, sclera anicteric OROPHARYNX: MMM,  no exudates, no oropharyngeal erythema or ulceration NECK: supple, no JVD LYMPH:  significant b/l cervical lymphadenopathy LUNGS: clear to auscultation b/l with normal respiratory effort HEART: regular rate & rhythm ABDOMEN:  normoactive bowel sounds , non tender, not distended. Extremity: no pedal edema PSYCH: alert & oriented x 3 with fluent speech NEURO: no focal motor/sensory deficits   LABORATORY DATA:  I have reviewed the data as listed.    Latest Ref Rng & Units 04/21/2023   12:59 PM 03/28/2023    4:40 PM 03/24/2023   12:10 PM  CBC  WBC 4.0 - 10.5 K/uL 3.1  3.7  3.3   Hemoglobin 12.0 - 15.0 g/dL 29.5  9.8  28.4   Hematocrit 36.0 - 46.0 % 32.3  32.6  35.9   Platelets 150 - 400 K/uL 224  129  172    .    Latest Ref Rng & Units 04/21/2023   12:59 PM 03/28/2023    4:40 PM 03/24/2023   12:10 PM  CMP  Glucose 70 - 99 mg/dL 91  91  74   BUN 8 - 23 mg/dL 18  22  20    Creatinine 0.44 - 1.00 mg/dL 1.32  4.40  1.02   Sodium 135 - 145 mmol/L 140  139  142   Potassium 3.5 - 5.1 mmol/L 3.5  3.6  3.7   Chloride 98 - 111 mmol/L 109  110  109   CO2 22 - 32 mmol/L 25  24  27    Calcium 8.9 - 10.3 mg/dL  9.2  8.4  9.2   Total Protein 6.5 - 8.1 g/dL 7.0  6.7  7.1   Total Bilirubin 0.3 - 1.2 mg/dL 0.4  0.4  0.4   Alkaline Phos 38 - 126 U/L 95  73  98   AST 15 - 41 U/L 16  25  19    ALT 0 - 44 U/L 9  29  14     . Lab Results  Component Value Date   LDH 217 (H) 03/07/2023      01/21/2023 Needle Core Biopsy:           RADIOGRAPHIC STUDIES: .NM PET Image Restag (PS) Skull Base To Thigh  Result Date: 05/15/2023 CLINICAL DATA:  Subsequent treatment strategy for peripheral T-cell lymphoma. EXAM: NUCLEAR MEDICINE PET SKULL BASE TO THIGH TECHNIQUE: 9.8 mCi F-18 FDG was injected intravenously. Full-ring PET imaging was performed from the skull base to thigh after the radiotracer. CT data was obtained and used for attenuation correction and anatomic localization. Fasting blood glucose: 93 mg/dl COMPARISON:  PET-CT dated 12/13/2022. There is Partial comparison to CT cervical spine dated 03/28/2023 and CTA neck dated 12/29/2022. CT chest dated 12/28/2022. FINDINGS: Mediastinal blood pool activity: SUV max 2.9 Liver activity: SUV max 4.2 NECK: Extensive bulky bilateral cervical and supraclavicular lymphadenopathy bilaterally. Dominant 4.4 cm short axis right lower cervical node (series 4/image 37), max SUV 25.8. These are progressive from priors, particularly from prior PET. Incidental CT findings: None. CHEST: 9 mm short axis left axillary node (series 4/image 85), max SUV 5.7. 7 mm short axis left lower axillary/chest wall node (series 4/image 70), max SUV 8.2. These are new/progressive from prior CTA chest and PET-CT. Surgical clips along the left lower lateral chest wall. Left hilar hypermetabolism, max SUV 6.2, without visualize node on unenhanced CT. No suspicious pulmonary nodules. Right chest port terminates at the cavoatrial junction. Incidental CT findings: Mild atherosclerotic calcifications of the aortic arch. ABDOMEN/PELVIS: No abnormal hypermetabolism in  the liver, spleen, pancreas, or adrenal  glands. No hypermetabolic abdominopelvic or retroperitoneal lymphadenopathy. 6 mm short axis right external iliac node (series 4/image 158), max SUV 5.3. 13 mm short axis right inguinal node (series 4/image 154), max SUV 11.4. These are new from prior PET. Incidental CT findings: Status post cholecystectomy. Atherosclerotic calcifications abdominal aorta and branch vessels. Streak artifact obscures the pelvis. SKELETON: No focal hypermetabolic activity to suggest skeletal metastasis. Incidental CT findings: Status post right shoulder arthroplasty. Associated hypermetabolism, likely postprocedural. Degenerative changes of the thoracolumbar spine with lumbar dextroscoliosis. Bilateral hip arthroplasties. IMPRESSION: Extensive bilateral cervical lymphadenopathy, progressive. Additional new/progressive lymphadenopathy in the left chest and right pelvis, as above. Deauville criteria 5. Electronically Signed   By: Charline Bills M.D.   On: 05/15/2023 12:25     ASSESSMENT & PLAN:   Cynthia Velasquez is a 72 y.o.  female with   1. Relapsed 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 Plan -Results of excisional lymph node biopsy showing peripheral T-cell lymphoma not otherwise specified which is CD30 negative were discussed in detail with the patient. PET CT scan was previously reviewed and she appears to have at least stage II disease but depending on the liver lesion etiology this could represent stage IV disease. She has had anthracycline exposure as a part of her AC regimen for previous treatment of breast cancer and is therefore limited in her total lifetime anthracycline use. CD 30 negative status precludes use of Adcentris.  2. Anal Squamous Cell Carcinoma, cT2N0M0-currently in remission.  Details as noted above   3. 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 Completed tamoxifen 09/2007 - 09/2012 -2008: stage  pT2N0 left breast invasive ductal carcinoma, triple negative, grade 3; s/p lumpectomy in 07/2007 and bilateral mastectomy 11/2007 (path negative for residual carcinoma in both breasts); s/p adjuvant chemo AC-T and radiation  4. H/o Chronic diarrhea -- follows with GI  5. Hx of neuropathy from previous chemotherapy  6. Extensive previous exposure to chemotherapy.  PLAN:  -discussed PET scan from 05/09/2023 shows extensive bilateral cervical lymphadenopathy, progressive. Additional new/progressive lymphadenopathy in the left chest and right pelvis.  -disease is refractory to second line treatment with Belinostat -Will stop Belinostat  -will order high potency steroid cream to apply locally to skin rashes in bilateral axillaries, which likely suggests lymphoma involvement -will order high dose prednisone 50mg  po daily for 7-10 days -okay for patient to take OTC B-complex -discussed option of sandostatin injections if needed if uncontrolled diarrhea with treatments -informed patient that treatment would not be curative. Discussed that treatment may temporarily control disease, but would likely cause significant side affects such as fatigue, infections, diarrhea, or hospitalizations -discussed details of potential single-agent/combination treatments -cannot try Brentuximab due to patient being CD30 negative -discussed option of hospice care to focus on comfort -patient would like to proceed with treatment at this time -swelling from lymphoma is bothersome in her bilateral neck at this time. No bothersome symptoms in any other areas.  -will refer patient to radiation oncologist to consider palliative ISRT to  treat lymph nodes in the neck which are bothersome at this time -systemic therapies may be considered to treat other areas of disease -discussed option of Bendamustine treatment -discussed option of Duvelisib oral medication. Discussed that this medication may worsen diarrhea or increase the  risk of infection.  -discussed option of patient receiving an additional opinion at [facility] to consider clinical  trials -advised patient that if she has any significant swallowing issues or breathing issues, she should present to the emergency room to receive emergent radiation therapy  -answered all of patient's and her daughter's questions in detail  FOLLOW UP: -Urgent referral to radiation oncology for consideration of palliative radiation therapy for bilateral bulky cervical lymphadenopathy in a patient with relapsed refractory peripheral T-cell lymphoma -Please cancel scheduled belinostat appointments -Return to clinic with Dr. Candise Che with labs in 2 weeks -Referral to Dr. Alphonsa Gin at [facility] for second opinion/clinical trials consideration for relapsed refractory peripheral T-cell lymphoma NOS  The total time spent in the appointment was 51 minutes* .  All of the patient's questions were answered with apparent satisfaction. The patient knows to call the clinic with any problems, questions or concerns.   Wyvonnia Lora MD MS AAHIVMS [facility] Hematology/Oncology Physician [facility]  .*Total Encounter Time as defined by the Centers for Medicare and Medicaid Services includes, in addition to the face-to-face time of a patient visit (documented in the note above) non-face-to-face time: obtaining and reviewing outside history, ordering and reviewing medications, tests or procedures, care coordination (communications with other health care professionals or caregivers) and documentation in the medical record.    I,Mitra Faeizi,acting as a Neurosurgeon for Wyvonnia Lora, MD.,have documented all relevant documentation on the behalf of Wyvonnia Lora, MD,as directed by  Wyvonnia Lora, MD while in the presence of Wyvonnia Lora, MD.  .I have reviewed the above documentation for accuracy and completeness, and I agree with the above. [name] MD

## 2023-05-20 ENCOUNTER — Encounter: Payer: Self-pay | Admitting: Hematology

## 2023-05-20 ENCOUNTER — Inpatient Hospital Stay: Payer: PPO

## 2023-05-20 ENCOUNTER — Inpatient Hospital Stay: Payer: PPO | Attending: Hematology

## 2023-05-20 ENCOUNTER — Inpatient Hospital Stay: Payer: PPO | Admitting: Hematology

## 2023-05-20 MED ORDER — TRIAMCINOLONE ACETONIDE 0.5 % EX OINT: 1.0000 | TOPICAL_OINTMENT | Freq: Two times a day (BID) | CUTANEOUS | 0 refills | Status: AC

## 2023-05-20 NOTE — Progress Notes (Signed)
Radiation Oncology         [phone] ________________________________  Name: Cynthia Velasquez        MRN: [id number]  Date of Service: 05/22/2023 DOB: [birth year]  WU:[id number], Cynthia Stanley, MD  [name], MD     REFERRING PHYSICIAN: [name], MD   DIAGNOSIS: The primary encounter diagnosis was Malignant neoplasm of upper-outer quadrant of left breast in female, estrogen receptor positive (HCC). 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   HISTORY OF PRESENT ILLNESS: Cynthia Velasquez is a 72 y.o. female seen at the request of Dr. Candise Velasquez for a diagnosis of reactivated T-cell lymphoma.  The patient has a history of right breast cancer for which she received lumpectomy and also adjuvant radiation in 2008 as well as bilateral mastectomies, the history is not quite clear any longer because of the medical records not being available at this time.  She was diagnosed with squamous cell carcinoma of the anus and treated with chemoradiation which she completed in November 2020 under the care of Dr. Mitzi Hansen.  Unfortunately the patient was diagnosed with non-Hodgkin's lymphoma involving multiple lymph node sites in October 2023.  She began systemic chemotherapy in October 2023 and her regimen concluded in February 2024. A PET scan on 12/13/2022 showed recurrent hypermetabolic cervical adenopathy bilaterally indicating recurrent disease.  The largest area measured up to 14 mm on the right level 4 station.  She began Belinostat treatment on 02/24/2023.  She has noticed an increase in the size of her lymph nodes along the jawline and a recent PET on 05/09/2023 showed progressive extensive bilateral cervical lymphadenopathy with additional new/progressive lymphadenopathy in the left chest and right pelvis, the largest area along the cervical nodal station measured 4.4 cm progressive from her prior  imaging and also involving the supraclavicular station, left axilla/chest wall, hilum without node on unenhanced CT, right external iliac and right inguinal stations as well.  Given her symptoms she is seen to consider palliative radiation.     PREVIOUS RADIATION THERAPY: Yes   06/28/2019-08/09/2019:  The anus and regional nodes were treated to 54 Gy in 30 fractions  2008: Post lumpectomy adjuvant radiotherapy to the right breast though medical oncology notes indicate that she may have received postmastectomy radiation in 2010.  Unfortunately her records are not available to discern the difference.   PAST MEDICAL HISTORY:  Past Medical History:  Diagnosis Date   Anal cancer (HCC) dx'd 05/2019   Anxiety    Arthritis    Bilateral breast cancer (HCC) 10/21/2013   Breast cancer (HCC)    Cancer (HCC)    Depression    Gallstones    GERD (gastroesophageal reflux disease)    doesn't take any meds for this   Headache    h/o migraines       History of bladder infections    History of hiatal hernia    History of migraine    Hyperlipidemia    takes Simvastatin daily   Hypertension    takes Hyzaar   Joint pain    Joint swelling    Lumbar stenosis    Neuropathy 09/07/2013   Pneumonia    Pre-diabetes        PAST SURGICAL HISTORY: Past Surgical History:  Procedure Laterality Date   ABDOMINAL HYSTERECTOMY     bone spur removed from left foot     CHOLECYSTECTOMY  COLONOSCOPY     COLONOSCOPY WITH PROPOFOL Bilateral 12/07/2021   Procedure: COLONOSCOPY WITH PROPOFOL;  Surgeon: Vida Rigger, MD;  Location: [facility];  Service: Endoscopy;  Laterality: Bilateral;   double mastectomy      ESOPHAGOGASTRODUODENOSCOPY N/A 12/07/2021   Procedure: ESOPHAGOGASTRODUODENOSCOPY (EGD);  Surgeon: Vida Rigger, MD;  Location: [facility];  Service: Endoscopy;  Laterality: N/A;   HERNIA REPAIR     umbilical   IR IMAGING GUIDED PORT INSERTION  06/25/2022   right knee arthroscopy     right  shoulder arthroscopy     surgery for hiatal hernia     TONSILLECTOMY     TOTAL HIP ARTHROPLASTY Left 02/12/2013   TOTAL HIP ARTHROPLASTY Left 02/12/2013   Procedure: LEFT TOTAL HIP ARTHROPLASTY;  Surgeon: Nestor Lewandowsky, MD;  Location: [facility] OR;  Service: Orthopedics;  Laterality: Left;   TOTAL HIP ARTHROPLASTY Right 05/03/2013   Procedure: TOTAL HIP ARTHROPLASTY;  Surgeon: Nestor Lewandowsky, MD;  Location: [facility] OR;  Service: Orthopedics;  Laterality: Right;   TOTAL KNEE ARTHROPLASTY Right 02/27/2015   Procedure: TOTAL KNEE ARTHROPLASTY;  Surgeon: Gean Birchwood, MD;  Location: [facility] OR;  Service: Orthopedics;  Laterality: Right;   TOTAL SHOULDER ARTHROPLASTY Right 09/12/2016   Procedure: RIGHT TOTAL SHOULDER ARTHROPLASTY;  Surgeon: Jones Broom, MD;  Location: [facility] OR;  Service: Orthopedics;  Laterality: Right;  RIGHT TOTAL SHOULDER ARTHROPLASTY     FAMILY HISTORY:  Family History  Adopted: Yes  Problem Relation Age of Onset   Allergic rhinitis Neg Hx    Angioedema Neg Hx    Atopy Neg Hx    Eczema Neg Hx    Immunodeficiency Neg Hx    Urticaria Neg Hx    Asthma Neg Hx      SOCIAL HISTORY:  reports that she has never smoked. She has never used smokeless tobacco. She reports that she does not drink alcohol and does not use drugs. The patient is single and lives in [place].    ALLERGIES: Tramadol, Belinostat, Codeine, and Vicodin [hydrocodone-acetaminophen]   MEDICATIONS:  Current Outpatient Medications  Medication Sig Dispense Refill   aspirin-acetaminophen-caffeine (EXCEDRIN MIGRAINE) 250-250-65 MG tablet Take 2 tablets by mouth every 6 (six) hours as needed for migraine.     DULoxetine (CYMBALTA) 60 MG capsule Take 1 capsule (60 mg total) by mouth 2 (two) times daily. Take 1 capsule (30 mg) at bedtime for 1 week, if no side effects, then increase to 2 capsules (60 mg) thereafter. 60 capsule 11   lidocaine (LMX) 4 % cream Apply topically 2 (two) times daily as needed (pain). 30 g 0   losartan  (COZAAR) 100 MG tablet Take 100 mg by mouth daily.  6   Menthol-Methyl Salicylate (SALONPAS PAIN RELIEF PATCH EX) Apply 1 patch topically daily as needed (pain).     methocarbamol (ROBAXIN) 750 MG tablet Take 1 tablet (750 mg total) by mouth every 8 (eight) hours as needed for muscle spasms. 20 tablet 0   Nystatin (GERHARDT'S BUTT CREAM) CREA Apply 1 Application topically 2 (two) times daily. (Patient taking differently: Apply 1 Application topically 2 (two) times daily as needed for irritation.) 30 each 0   nystatin (MYCOSTATIN) 100000 UNIT/ML suspension Take 5 mLs (500,000 Units total) by mouth 4 (four) times daily as needed. Swish and swallow 60 mL 0   nystatin (MYCOSTATIN/NYSTOP) powder Apply 1 Application topically 3 (three) times daily. (Patient taking differently: Apply 1 Application topically 3 (three) times daily as needed (redness, rash).) 15 g 0  ondansetron (ZOFRAN) 8 MG tablet Take 1 tablet (8 mg total) by mouth every 8 (eight) hours as needed for nausea or vomiting. 30 tablet 1   pantoprazole (PROTONIX) 40 MG tablet Take 1 tablet (40 mg total) by mouth daily. (Patient taking differently: Take 40 mg by mouth daily as needed (for acid reflux).) 30 tablet 0   predniSONE (DELTASONE) 50 MG tablet Take 1 tablet (50 mg total) by mouth daily with breakfast for 10 days. 10 tablet 0   prochlorperazine (COMPAZINE) 10 MG tablet Take 1 tablet (10 mg total) by mouth every 6 (six) hours as needed for nausea or vomiting. 30 tablet 1   triamcinolone ointment (KENALOG) 0.5 % Apply 1 Application topically 2 (two) times daily. To rash under both armpits 30 g 0   No current facility-administered medications for this encounter.     REVIEW OF SYSTEMS: On review of systems, the patient reports that she is struggling with edema at the base of her neck and into her lower cheeks.  She states that it is very difficult to be comfortable and even hugs from her young granddaughter can cause significant pain in this  area.  She describes a sore throat at times when trying to eat certain foods.  She discusses that she has to sleep with her neck hyperextended in order to be able to breathe at night as well.  She is hopeful that upcoming systemic therapy will be beneficial as well and has had some changes in her voice as far as hoarseness.  No other complaints are verbalized.  African-American female    PHYSICAL EXAM:  Wt Readings from Last 3 Encounters:  05/22/23 197 lb 2 oz (89.4 kg)  05/16/23 197 lb 8 oz (89.6 kg)  04/21/23 197 lb 11.2 oz (89.7 kg)   Temp Readings from Last 3 Encounters:  05/22/23 (!) 97.5 F (36.4 C) (Temporal)  05/16/23 97.7 F (36.5 C) (Temporal)  04/26/23 98.4 F (36.9 C) (Oral)   BP Readings from Last 3 Encounters:  05/22/23 (!) 188/117  05/16/23 (!) 157/83  04/26/23 131/82   Pulse Readings from Last 3 Encounters:  05/22/23 88  05/16/23 75  04/26/23 80   Pain Assessment Pain Score: 6  Pain Loc: Neck/10  In general this is a well appearing African-American female in no acute distress.  HEENT reveals that she has normocephalic atraumatic EOMs are intact.  Along the lower aspect of her jawline involving the region of the submandibular and cervical lymph node chains, she has significant edema this is somewhat diffuse without focal finding.  She's alert and oriented x4 and appropriate throughout the examination. Cardiopulmonary assessment is negative for acute distress and she exhibits normal effort.     ECOG = 1  0 - Asymptomatic (Fully active, able to carry on all predisease activities without restriction)  1 - Symptomatic but completely ambulatory (Restricted in physically strenuous activity but ambulatory and able to carry out work of a light or sedentary nature. For example, light housework, office work)  2 - Symptomatic, <50% in bed during the day (Ambulatory and capable of all self care but unable to carry out any work activities. Up and about more than 50% of  waking hours)  3 - Symptomatic, >50% in bed, but not bedbound (Capable of only limited self-care, confined to bed or chair 50% or more of waking hours)  4 - Bedbound (Completely disabled. Cannot carry on any self-care. Totally confined to bed or chair)  5 - Death  Oken MM, Creech RH, Tormey DC, et al. [phone]). "Toxicity and response criteria of the [facility]". Am. Evlyn Clines. Oncol. 5 (6): 649-55    LABORATORY DATA:  Lab Results  Component Value Date   WBC 3.1 (L) 04/21/2023   HGB 10.6 (L) 04/21/2023   HCT 32.3 (L) 04/21/2023   MCV 81.0 04/21/2023   PLT 224 04/21/2023   Lab Results  Component Value Date   NA 140 04/21/2023   K 3.5 04/21/2023   CL 109 04/21/2023   CO2 25 04/21/2023   Lab Results  Component Value Date   ALT 9 04/21/2023   AST 16 04/21/2023   ALKPHOS 95 04/21/2023   BILITOT 0.4 04/21/2023      RADIOGRAPHY: NM PET Image Restag (PS) Skull Base To Thigh  Result Date: 05/15/2023 CLINICAL DATA:  Subsequent treatment strategy for peripheral T-cell lymphoma. EXAM: NUCLEAR MEDICINE PET SKULL BASE TO THIGH TECHNIQUE: 9.8 mCi F-18 FDG was injected intravenously. Full-ring PET imaging was performed from the skull base to thigh after the radiotracer. CT data was obtained and used for attenuation correction and anatomic localization. Fasting blood glucose: 93 mg/dl COMPARISON:  PET-CT dated 12/13/2022. There is Partial comparison to CT cervical spine dated 03/28/2023 and CTA neck dated 12/29/2022. CT chest dated 12/28/2022. FINDINGS: Mediastinal blood pool activity: SUV max 2.9 Liver activity: SUV max 4.2 NECK: Extensive bulky bilateral cervical and supraclavicular lymphadenopathy bilaterally. Dominant 4.4 cm short axis right lower cervical node (series 4/image 37), max SUV 25.8. These are progressive from priors, particularly from prior PET. Incidental CT findings: None. CHEST: 9 mm short axis left axillary node (series 4/image 85), max SUV 5.7. 7 mm  short axis left lower axillary/chest wall node (series 4/image 70), max SUV 8.2. These are new/progressive from prior CTA chest and PET-CT. Surgical clips along the left lower lateral chest wall. Left hilar hypermetabolism, max SUV 6.2, without visualize node on unenhanced CT. No suspicious pulmonary nodules. Right chest port terminates at the cavoatrial junction. Incidental CT findings: Mild atherosclerotic calcifications of the aortic arch. ABDOMEN/PELVIS: No abnormal hypermetabolism in the liver, spleen, pancreas, or adrenal glands. No hypermetabolic abdominopelvic or retroperitoneal lymphadenopathy. 6 mm short axis right external iliac node (series 4/image 158), max SUV 5.3. 13 mm short axis right inguinal node (series 4/image 154), max SUV 11.4. These are new from prior PET. Incidental CT findings: Status post cholecystectomy. Atherosclerotic calcifications abdominal aorta and branch vessels. Streak artifact obscures the pelvis. SKELETON: No focal hypermetabolic activity to suggest skeletal metastasis. Incidental CT findings: Status post right shoulder arthroplasty. Associated hypermetabolism, likely postprocedural. Degenerative changes of the thoracolumbar spine with lumbar dextroscoliosis. Bilateral hip arthroplasties. IMPRESSION: Extensive bilateral cervical lymphadenopathy, progressive. Additional new/progressive lymphadenopathy in the left chest and right pelvis, as above. Deauville criteria 5. Electronically Signed   By: Charline Bills M.D.   On: 05/15/2023 12:25       IMPRESSION/PLAN: 1. Relapsed T-cell lymphoma involving multiple lymph node stations but specifically symptomatic in the cervical region. Dr. Mitzi Hansen discusses the patient's workup and process thus far.  He agrees with the role of palliative radiation to treat her nodes in the neck area.  We discussed the risks, benefits, short, and long term effects of radiotherapy, as well as the palliative intent, and the patient is interested in  proceeding. Dr. Mitzi Hansen discusses the delivery and logistics of radiotherapy and anticipates a course of 2 weeks of radiotherapy. Written consent is obtained and placed in the chart, a copy was provided to  the patient. She will simulate today. We will also refer her to PT for further evaluation in advance of radiation in case she develops lymphedema and to see if any massage techniques can palliate her symptoms while proceeding with radiation.  2. Squamous cell carcinoma of the anus.  The patient remains in remission and continues to be followed by oncology and colorectal surgery.   3. Remote history of breast cancer she is also followed clinically by medical oncology given this history as well.   In a visit lasting 60 minutes, greater than 50% of the time was spent face to face discussing the patient's condition, in preparation for the discussion, and coordinating the patient's care.   The above documentation reflects my direct findings during this shared patient visit. Please see the separate note by Dr. Mitzi Hansen on this date for the remainder of the patient's plan of care.    Osker Mason, [facility]   **Disclaimer: This note was dictated with voice recognition software. Similar sounding words can inadvertently be transcribed and this note may contain transcription errors which may not have been corrected upon publication of note.**

## 2023-05-21 ENCOUNTER — Other Ambulatory Visit: Payer: Self-pay | Admitting: Hematology

## 2023-05-21 ENCOUNTER — Inpatient Hospital Stay: Payer: PPO

## 2023-05-21 MED ORDER — PREDNISONE 50 MG PO TABS: 50.0000 mg | ORAL_TABLET | Freq: Every day | ORAL | 0 refills | Status: AC

## 2023-05-22 ENCOUNTER — Encounter: Payer: Self-pay | Admitting: Radiation Oncology

## 2023-05-22 ENCOUNTER — Ambulatory Visit
Admission: RE | Admit: 2023-05-22 | Discharge: 2023-05-22 | Disposition: A | Discharge status: Home / Self Care | Payer: PPO | Source: Ambulatory Visit | Attending: Radiation Oncology | Admitting: Radiation Oncology

## 2023-05-22 ENCOUNTER — Inpatient Hospital Stay: Payer: PPO

## 2023-05-22 ENCOUNTER — Encounter: Payer: Self-pay | Admitting: Hematology

## 2023-05-22 VITALS — BP 188/117 | HR 88 | Temp 97.5°F | Resp 18 | Ht 65.0 in | Wt 197.1 lb

## 2023-05-22 DIAGNOSIS — K449 Diaphragmatic hernia without obstruction or gangrene: Secondary | ICD-10-CM | POA: Diagnosis not present

## 2023-05-22 DIAGNOSIS — Z79899 Other long term (current) drug therapy: Secondary | ICD-10-CM | POA: Insufficient documentation

## 2023-05-22 DIAGNOSIS — C8448 Peripheral T-cell lymphoma, not classified, lymph nodes of multiple sites: Secondary | ICD-10-CM

## 2023-05-22 DIAGNOSIS — G629 Polyneuropathy, unspecified: Secondary | ICD-10-CM | POA: Diagnosis not present

## 2023-05-22 DIAGNOSIS — C8441 Peripheral T-cell lymphoma, not classified, lymph nodes of head, face, and neck: Secondary | ICD-10-CM | POA: Diagnosis not present

## 2023-05-22 DIAGNOSIS — Z923 Personal history of irradiation: Secondary | ICD-10-CM | POA: Diagnosis not present

## 2023-05-22 DIAGNOSIS — I700 Atherosclerosis of aorta: Secondary | ICD-10-CM | POA: Diagnosis not present

## 2023-05-22 DIAGNOSIS — Z7952 Long term (current) use of systemic steroids: Secondary | ICD-10-CM | POA: Diagnosis not present

## 2023-05-22 DIAGNOSIS — C8590 Non-Hodgkin lymphoma, unspecified, unspecified site: Secondary | ICD-10-CM

## 2023-05-22 DIAGNOSIS — I10 Essential (primary) hypertension: Secondary | ICD-10-CM | POA: Diagnosis not present

## 2023-05-22 DIAGNOSIS — C210 Malignant neoplasm of anus, unspecified: Secondary | ICD-10-CM

## 2023-05-22 DIAGNOSIS — E785 Hyperlipidemia, unspecified: Secondary | ICD-10-CM | POA: Diagnosis not present

## 2023-05-22 DIAGNOSIS — Z510 Encounter for antineoplastic radiation therapy: Secondary | ICD-10-CM | POA: Insufficient documentation

## 2023-05-22 DIAGNOSIS — K219 Gastro-esophageal reflux disease without esophagitis: Secondary | ICD-10-CM | POA: Diagnosis not present

## 2023-05-22 DIAGNOSIS — Z853 Personal history of malignant neoplasm of breast: Secondary | ICD-10-CM | POA: Insufficient documentation

## 2023-05-22 DIAGNOSIS — Z85048 Personal history of other malignant neoplasm of rectum, rectosigmoid junction, and anus: Secondary | ICD-10-CM | POA: Diagnosis not present

## 2023-05-22 DIAGNOSIS — Z170 Estrogen receptor positive status [ER+]: Secondary | ICD-10-CM

## 2023-05-22 DIAGNOSIS — C50412 Malignant neoplasm of upper-outer quadrant of left female breast: Secondary | ICD-10-CM | POA: Diagnosis not present

## 2023-05-22 NOTE — Progress Notes (Signed)
Nursing interview for Malignant neoplasm of upper-outer quadrant of left breast in female, estrogen receptor positive (HCC). 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  Patient identity verified x2.  Patient reports neck pain, average 6/10. No other issues conveyed at this time.  Meaningful use complete.  Vitals- BP (!) 188/117 (BP Location: Left Arm, Patient Position: Sitting, Cuff Size: Large)   Pulse 88   Temp (!) 97.5 F (36.4 C) (Temporal)   Resp 18   Ht 5\' 5"  (1.651 m)   Wt 197 lb 2 oz (89.4 kg)   SpO2 98%   BMI 32.80 kg/m   This concludes the interaction.  Ruel Favors, LPN

## 2023-05-23 ENCOUNTER — Other Ambulatory Visit: Payer: Self-pay

## 2023-05-23 ENCOUNTER — Inpatient Hospital Stay: Payer: PPO

## 2023-05-26 ENCOUNTER — Inpatient Hospital Stay: Payer: PPO

## 2023-05-26 ENCOUNTER — Encounter: Payer: Self-pay | Admitting: Hematology

## 2023-05-27 ENCOUNTER — Ambulatory Visit
Admission: RE | Admit: 2023-05-27 | Discharge: 2023-05-27 | Disposition: A | Discharge status: Home / Self Care | Payer: PPO | Source: Ambulatory Visit | Attending: Radiation Oncology | Admitting: Radiation Oncology

## 2023-05-27 ENCOUNTER — Other Ambulatory Visit: Payer: Self-pay

## 2023-05-27 DIAGNOSIS — Z510 Encounter for antineoplastic radiation therapy: Secondary | ICD-10-CM | POA: Diagnosis not present

## 2023-05-27 DIAGNOSIS — C8441 Peripheral T-cell lymphoma, not classified, lymph nodes of head, face, and neck: Secondary | ICD-10-CM | POA: Diagnosis not present

## 2023-05-27 LAB — RAD ONC ARIA SESSION SUMMARY
Course Elapsed Days: 0
Plan Fractions Treated to Date: 1
Plan Prescribed Dose Per Fraction: 2 Gy
Plan Total Fractions Prescribed: 15
Plan Total Prescribed Dose: 30 Gy
Reference Point Dosage Given to Date: 1.929 Gy
Reference Point Session Dosage Given: 1.929 Gy
Session Number: 1

## 2023-05-28 ENCOUNTER — Other Ambulatory Visit: Payer: Self-pay

## 2023-05-28 ENCOUNTER — Ambulatory Visit
Admission: RE | Admit: 2023-05-28 | Discharge: 2023-05-28 | Disposition: A | Discharge status: Home / Self Care | Payer: PPO | Source: Ambulatory Visit | Attending: Radiation Oncology | Admitting: Radiation Oncology

## 2023-05-28 DIAGNOSIS — Z510 Encounter for antineoplastic radiation therapy: Secondary | ICD-10-CM | POA: Diagnosis not present

## 2023-05-28 DIAGNOSIS — C8441 Peripheral T-cell lymphoma, not classified, lymph nodes of head, face, and neck: Secondary | ICD-10-CM | POA: Diagnosis not present

## 2023-05-28 LAB — RAD ONC ARIA SESSION SUMMARY
Course Elapsed Days: 1
Course Elapsed Days: 1
Plan Fractions Treated to Date: 1
Plan Fractions Treated to Date: 2
Plan Prescribed Dose Per Fraction: 2 Gy
Plan Prescribed Dose Per Fraction: 2 Gy
Plan Total Fractions Prescribed: 15
Plan Total Fractions Prescribed: 15
Plan Total Prescribed Dose: 30 Gy
Plan Total Prescribed Dose: 30 Gy
Reference Point Dosage Given to Date: 2 Gy
Reference Point Dosage Given to Date: 4 Gy
Reference Point Session Dosage Given: 0.071 Gy
Reference Point Session Dosage Given: 2 Gy
Session Number: 2
Session Number: 3

## 2023-05-29 ENCOUNTER — Other Ambulatory Visit: Payer: Self-pay

## 2023-05-29 ENCOUNTER — Ambulatory Visit
Admission: RE | Admit: 2023-05-29 | Discharge: 2023-05-29 | Disposition: A | Discharge status: Home / Self Care | Payer: PPO | Source: Ambulatory Visit | Attending: Radiation Oncology | Admitting: Radiation Oncology

## 2023-05-29 DIAGNOSIS — C8441 Peripheral T-cell lymphoma, not classified, lymph nodes of head, face, and neck: Secondary | ICD-10-CM | POA: Diagnosis not present

## 2023-05-29 DIAGNOSIS — Z510 Encounter for antineoplastic radiation therapy: Secondary | ICD-10-CM | POA: Diagnosis not present

## 2023-05-29 LAB — RAD ONC ARIA SESSION SUMMARY
Course Elapsed Days: 2
Plan Fractions Treated to Date: 3
Plan Prescribed Dose Per Fraction: 2 Gy
Plan Total Fractions Prescribed: 15
Plan Total Prescribed Dose: 30 Gy
Reference Point Dosage Given to Date: 6 Gy
Reference Point Session Dosage Given: 2 Gy
Session Number: 4

## 2023-05-30 ENCOUNTER — Ambulatory Visit
Admission: RE | Admit: 2023-05-30 | Discharge: 2023-05-30 | Disposition: A | Discharge status: Home / Self Care | Payer: PPO | Source: Ambulatory Visit | Attending: Radiation Oncology | Admitting: Radiation Oncology

## 2023-05-30 ENCOUNTER — Other Ambulatory Visit: Payer: Self-pay

## 2023-05-30 DIAGNOSIS — C8441 Peripheral T-cell lymphoma, not classified, lymph nodes of head, face, and neck: Secondary | ICD-10-CM | POA: Diagnosis not present

## 2023-05-30 DIAGNOSIS — Z510 Encounter for antineoplastic radiation therapy: Secondary | ICD-10-CM | POA: Diagnosis not present

## 2023-05-30 LAB — RAD ONC ARIA SESSION SUMMARY
Course Elapsed Days: 3
Plan Fractions Treated to Date: 4
Plan Prescribed Dose Per Fraction: 2 Gy
Plan Total Fractions Prescribed: 15
Plan Total Prescribed Dose: 30 Gy
Reference Point Dosage Given to Date: 8 Gy
Reference Point Session Dosage Given: 2 Gy
Session Number: 5

## 2023-06-02 ENCOUNTER — Other Ambulatory Visit: Payer: Self-pay

## 2023-06-02 ENCOUNTER — Ambulatory Visit
Admission: RE | Admit: 2023-06-02 | Discharge: 2023-06-02 | Disposition: A | Discharge status: Home / Self Care | Payer: PPO | Source: Ambulatory Visit | Attending: Radiation Oncology | Admitting: Radiation Oncology

## 2023-06-02 DIAGNOSIS — C8441 Peripheral T-cell lymphoma, not classified, lymph nodes of head, face, and neck: Secondary | ICD-10-CM | POA: Diagnosis not present

## 2023-06-02 DIAGNOSIS — Z510 Encounter for antineoplastic radiation therapy: Secondary | ICD-10-CM | POA: Diagnosis not present

## 2023-06-02 LAB — RAD ONC ARIA SESSION SUMMARY
Course Elapsed Days: 6
Plan Fractions Treated to Date: 5
Plan Prescribed Dose Per Fraction: 2 Gy
Plan Total Fractions Prescribed: 15
Plan Total Prescribed Dose: 30 Gy
Reference Point Dosage Given to Date: 10 Gy
Reference Point Session Dosage Given: 2 Gy
Session Number: 6

## 2023-06-03 ENCOUNTER — Encounter: Payer: Self-pay | Admitting: Hematology

## 2023-06-03 ENCOUNTER — Ambulatory Visit
Admission: RE | Admit: 2023-06-03 | Discharge: 2023-06-03 | Disposition: A | Discharge status: Home / Self Care | Payer: PPO | Source: Ambulatory Visit | Attending: Radiation Oncology | Admitting: Radiation Oncology

## 2023-06-03 ENCOUNTER — Other Ambulatory Visit: Payer: Self-pay

## 2023-06-03 DIAGNOSIS — C8441 Peripheral T-cell lymphoma, not classified, lymph nodes of head, face, and neck: Secondary | ICD-10-CM | POA: Diagnosis not present

## 2023-06-03 DIAGNOSIS — Z510 Encounter for antineoplastic radiation therapy: Secondary | ICD-10-CM | POA: Diagnosis not present

## 2023-06-03 LAB — RAD ONC ARIA SESSION SUMMARY
Course Elapsed Days: 7
Plan Fractions Treated to Date: 6
Plan Prescribed Dose Per Fraction: 2 Gy
Plan Total Fractions Prescribed: 15
Plan Total Prescribed Dose: 30 Gy
Reference Point Dosage Given to Date: 12 Gy
Reference Point Session Dosage Given: 2 Gy
Session Number: 7

## 2023-06-04 ENCOUNTER — Ambulatory Visit
Admission: RE | Admit: 2023-06-04 | Discharge: 2023-06-04 | Disposition: A | Discharge status: Home / Self Care | Payer: PPO | Source: Ambulatory Visit | Attending: Radiation Oncology | Admitting: Radiation Oncology

## 2023-06-04 ENCOUNTER — Other Ambulatory Visit: Payer: Self-pay

## 2023-06-04 DIAGNOSIS — C8441 Peripheral T-cell lymphoma, not classified, lymph nodes of head, face, and neck: Secondary | ICD-10-CM | POA: Diagnosis not present

## 2023-06-04 DIAGNOSIS — Z510 Encounter for antineoplastic radiation therapy: Secondary | ICD-10-CM | POA: Diagnosis not present

## 2023-06-04 LAB — RAD ONC ARIA SESSION SUMMARY
Course Elapsed Days: 8
Plan Fractions Treated to Date: 7
Plan Prescribed Dose Per Fraction: 2 Gy
Plan Total Fractions Prescribed: 15
Plan Total Prescribed Dose: 30 Gy
Reference Point Dosage Given to Date: 14 Gy
Reference Point Session Dosage Given: 2 Gy
Session Number: 8

## 2023-06-05 ENCOUNTER — Ambulatory Visit
Admission: RE | Admit: 2023-06-05 | Discharge: 2023-06-05 | Disposition: A | Discharge status: Home / Self Care | Payer: PPO | Source: Ambulatory Visit | Attending: Radiation Oncology | Admitting: Radiation Oncology

## 2023-06-05 ENCOUNTER — Other Ambulatory Visit: Payer: Self-pay

## 2023-06-05 DIAGNOSIS — Z510 Encounter for antineoplastic radiation therapy: Secondary | ICD-10-CM | POA: Diagnosis not present

## 2023-06-05 DIAGNOSIS — C8441 Peripheral T-cell lymphoma, not classified, lymph nodes of head, face, and neck: Secondary | ICD-10-CM | POA: Diagnosis not present

## 2023-06-05 LAB — RAD ONC ARIA SESSION SUMMARY
Course Elapsed Days: 9
Plan Fractions Treated to Date: 8
Plan Prescribed Dose Per Fraction: 2 Gy
Plan Total Fractions Prescribed: 15
Plan Total Prescribed Dose: 30 Gy
Reference Point Dosage Given to Date: 16 Gy
Reference Point Session Dosage Given: 2 Gy
Session Number: 9

## 2023-06-05 NOTE — Progress Notes (Signed)
Prescription for [facility] Magic Mouthwash called in to [facility] pharmacy.  Lind Covert RN, BSN

## 2023-06-06 ENCOUNTER — Ambulatory Visit
Admission: RE | Admit: 2023-06-06 | Discharge: 2023-06-06 | Disposition: A | Discharge status: Home / Self Care | Payer: PPO | Source: Ambulatory Visit | Attending: Radiation Oncology | Admitting: Radiation Oncology

## 2023-06-06 ENCOUNTER — Other Ambulatory Visit: Payer: Self-pay

## 2023-06-06 DIAGNOSIS — C8441 Peripheral T-cell lymphoma, not classified, lymph nodes of head, face, and neck: Secondary | ICD-10-CM | POA: Diagnosis not present

## 2023-06-06 DIAGNOSIS — Z510 Encounter for antineoplastic radiation therapy: Secondary | ICD-10-CM | POA: Diagnosis not present

## 2023-06-06 LAB — RAD ONC ARIA SESSION SUMMARY
Course Elapsed Days: 10
Plan Fractions Treated to Date: 9
Plan Prescribed Dose Per Fraction: 2 Gy
Plan Total Fractions Prescribed: 15
Plan Total Prescribed Dose: 30 Gy
Reference Point Dosage Given to Date: 18 Gy
Reference Point Session Dosage Given: 2 Gy
Session Number: 10

## 2023-06-09 ENCOUNTER — Other Ambulatory Visit: Payer: Self-pay

## 2023-06-09 ENCOUNTER — Ambulatory Visit
Admission: RE | Admit: 2023-06-09 | Discharge: 2023-06-09 | Disposition: A | Discharge status: Home / Self Care | Payer: PPO | Source: Ambulatory Visit | Attending: Radiation Oncology | Admitting: Radiation Oncology

## 2023-06-09 DIAGNOSIS — C8441 Peripheral T-cell lymphoma, not classified, lymph nodes of head, face, and neck: Secondary | ICD-10-CM | POA: Diagnosis not present

## 2023-06-09 DIAGNOSIS — Z510 Encounter for antineoplastic radiation therapy: Secondary | ICD-10-CM | POA: Diagnosis not present

## 2023-06-09 LAB — RAD ONC ARIA SESSION SUMMARY
Course Elapsed Days: 13
Plan Fractions Treated to Date: 10
Plan Prescribed Dose Per Fraction: 2 Gy
Plan Total Fractions Prescribed: 15
Plan Total Prescribed Dose: 30 Gy
Reference Point Dosage Given to Date: 20 Gy
Reference Point Session Dosage Given: 2 Gy
Session Number: 11

## 2023-06-10 ENCOUNTER — Encounter: Payer: Self-pay | Admitting: Hematology

## 2023-06-10 ENCOUNTER — Other Ambulatory Visit: Payer: Self-pay

## 2023-06-10 ENCOUNTER — Ambulatory Visit
Admission: RE | Admit: 2023-06-10 | Discharge: 2023-06-10 | Disposition: A | Discharge status: Home / Self Care | Payer: PPO | Source: Ambulatory Visit | Attending: Radiation Oncology | Admitting: Radiation Oncology

## 2023-06-10 DIAGNOSIS — Z510 Encounter for antineoplastic radiation therapy: Secondary | ICD-10-CM | POA: Diagnosis not present

## 2023-06-10 DIAGNOSIS — C8441 Peripheral T-cell lymphoma, not classified, lymph nodes of head, face, and neck: Secondary | ICD-10-CM | POA: Diagnosis not present

## 2023-06-10 LAB — RAD ONC ARIA SESSION SUMMARY
Course Elapsed Days: 14
Plan Fractions Treated to Date: 11
Plan Prescribed Dose Per Fraction: 2 Gy
Plan Total Fractions Prescribed: 15
Plan Total Prescribed Dose: 30 Gy
Reference Point Dosage Given to Date: 22 Gy
Reference Point Session Dosage Given: 2 Gy
Session Number: 12

## 2023-06-11 ENCOUNTER — Ambulatory Visit
Admission: RE | Admit: 2023-06-11 | Discharge: 2023-06-11 | Disposition: A | Discharge status: Home / Self Care | Payer: PPO | Source: Ambulatory Visit | Attending: Radiation Oncology | Admitting: Radiation Oncology

## 2023-06-11 ENCOUNTER — Ambulatory Visit: Payer: PPO | Admitting: Rehabilitation

## 2023-06-11 ENCOUNTER — Other Ambulatory Visit: Payer: Self-pay

## 2023-06-11 DIAGNOSIS — Z510 Encounter for antineoplastic radiation therapy: Secondary | ICD-10-CM | POA: Diagnosis not present

## 2023-06-11 DIAGNOSIS — C8441 Peripheral T-cell lymphoma, not classified, lymph nodes of head, face, and neck: Secondary | ICD-10-CM | POA: Diagnosis not present

## 2023-06-11 LAB — RAD ONC ARIA SESSION SUMMARY
Course Elapsed Days: 15
Plan Fractions Treated to Date: 12
Plan Prescribed Dose Per Fraction: 2 Gy
Plan Total Fractions Prescribed: 15
Plan Total Prescribed Dose: 30 Gy
Reference Point Dosage Given to Date: 24 Gy
Reference Point Session Dosage Given: 2 Gy
Session Number: 13

## 2023-06-12 ENCOUNTER — Ambulatory Visit
Admission: RE | Admit: 2023-06-12 | Discharge: 2023-06-12 | Disposition: A | Discharge status: Home / Self Care | Payer: PPO | Source: Ambulatory Visit | Attending: Radiation Oncology | Admitting: Radiation Oncology

## 2023-06-12 ENCOUNTER — Ambulatory Visit: Payer: PPO | Attending: Radiation Oncology | Admitting: Physical Therapy

## 2023-06-12 ENCOUNTER — Other Ambulatory Visit: Payer: Self-pay

## 2023-06-12 ENCOUNTER — Encounter: Payer: Self-pay | Admitting: Physical Therapy

## 2023-06-12 DIAGNOSIS — C8448 Peripheral T-cell lymphoma, not classified, lymph nodes of multiple sites: Secondary | ICD-10-CM | POA: Diagnosis not present

## 2023-06-12 DIAGNOSIS — I890 Lymphedema, not elsewhere classified: Secondary | ICD-10-CM | POA: Diagnosis not present

## 2023-06-12 DIAGNOSIS — C8590 Non-Hodgkin lymphoma, unspecified, unspecified site: Secondary | ICD-10-CM | POA: Diagnosis not present

## 2023-06-12 DIAGNOSIS — L599 Disorder of the skin and subcutaneous tissue related to radiation, unspecified: Secondary | ICD-10-CM | POA: Diagnosis not present

## 2023-06-12 DIAGNOSIS — Z510 Encounter for antineoplastic radiation therapy: Secondary | ICD-10-CM | POA: Diagnosis not present

## 2023-06-12 DIAGNOSIS — C8441 Peripheral T-cell lymphoma, not classified, lymph nodes of head, face, and neck: Secondary | ICD-10-CM | POA: Diagnosis not present

## 2023-06-12 LAB — RAD ONC ARIA SESSION SUMMARY
Course Elapsed Days: 16
Plan Fractions Treated to Date: 13
Plan Prescribed Dose Per Fraction: 2 Gy
Plan Total Fractions Prescribed: 15
Plan Total Prescribed Dose: 30 Gy
Reference Point Dosage Given to Date: 26 Gy
Reference Point Session Dosage Given: 2 Gy
Session Number: 14

## 2023-06-12 NOTE — Therapy (Signed)
OUTPATIENT PHYSICAL THERAPY HEAD AND NECK BASELINE EVALUATION   Patient Name: Cynthia Velasquez MRN: [id number] DOB:[birth year], 72 y.o., female Today's Date: 06/12/2023  END OF SESSION:  PT End of Session - 06/12/23 1048     Visit Number 1    Number of Visits 9    Date for PT Re-Evaluation 07/10/23    PT Start Time 1008    PT Stop Time 1048    PT Time Calculation (min) 40 min    Activity Tolerance Patient tolerated treatment well    Behavior During Therapy [facility] for tasks assessed/performed             Past Medical History:  Diagnosis Date   Anal cancer (HCC) dx'd 05/2019   Anxiety    Arthritis    Bilateral breast cancer (HCC) 10/21/2013   Breast cancer (HCC)    Cancer (HCC)    Depression    Gallstones    GERD (gastroesophageal reflux disease)    doesn't take any meds for this   Headache    h/o migraines       History of bladder infections    History of hiatal hernia    History of migraine    Hyperlipidemia    takes Simvastatin daily   Hypertension    takes Hyzaar   Joint pain    Joint swelling    Lumbar stenosis    Neuropathy 09/07/2013   Pneumonia    Pre-diabetes    Past Surgical History:  Procedure Laterality Date   ABDOMINAL HYSTERECTOMY     bone spur removed from left foot     CHOLECYSTECTOMY     COLONOSCOPY     COLONOSCOPY WITH PROPOFOL Bilateral 12/07/2021   Procedure: COLONOSCOPY WITH PROPOFOL;  Surgeon: Vida Rigger, MD;  Location: [facility];  Service: Endoscopy;  Laterality: Bilateral;   double mastectomy      ESOPHAGOGASTRODUODENOSCOPY N/A 12/07/2021   Procedure: ESOPHAGOGASTRODUODENOSCOPY (EGD);  Surgeon: Vida Rigger, MD;  Location: [facility];  Service: Endoscopy;  Laterality: N/A;   HERNIA REPAIR     umbilical   IR IMAGING GUIDED PORT INSERTION  06/25/2022   right knee arthroscopy     right shoulder arthroscopy     surgery for hiatal hernia     TONSILLECTOMY     TOTAL HIP ARTHROPLASTY Left 02/12/2013   TOTAL HIP ARTHROPLASTY Left  02/12/2013   Procedure: LEFT TOTAL HIP ARTHROPLASTY;  Surgeon: Nestor Lewandowsky, MD;  Location: [facility] OR;  Service: Orthopedics;  Laterality: Left;   TOTAL HIP ARTHROPLASTY Right 05/03/2013   Procedure: TOTAL HIP ARTHROPLASTY;  Surgeon: Nestor Lewandowsky, MD;  Location: [facility] OR;  Service: Orthopedics;  Laterality: Right;   TOTAL KNEE ARTHROPLASTY Right 02/27/2015   Procedure: TOTAL KNEE ARTHROPLASTY;  Surgeon: Gean Birchwood, MD;  Location: [facility] OR;  Service: Orthopedics;  Laterality: Right;   TOTAL SHOULDER ARTHROPLASTY Right 09/12/2016   Procedure: RIGHT TOTAL SHOULDER ARTHROPLASTY;  Surgeon: Jones Broom, MD;  Location: [facility] OR;  Service: Orthopedics;  Laterality: Right;  RIGHT TOTAL SHOULDER ARTHROPLASTY   Patient Active Problem List   Diagnosis Date Noted   Syncope and collapse 12/29/2022   Hypomagnesemia 12/29/2022   Perianal pain 11/05/2022   Vaginal candidiasis 11/04/2022   Hypokalemia 10/28/2022   Pancytopenia (HCC) 10/28/2022   Port-A-Cath in place 10/01/2022   Chemotherapy-induced neuropathy (HCC) 09/03/2022   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   Incontinence of feces 09/28/2020   Essential  hypertension 09/27/2020   GERD without esophagitis 09/27/2020   History of bilateral mastectomy 09/27/2020   Hyperglycemia 09/27/2020   Moderate recurrent major depression (HCC) 09/27/2020   Pure hypercholesterolemia 09/27/2020   Anal cancer (HCC) 06/13/2019   Status post total shoulder arthroplasty, right 09/12/2016   Arthritis of knee 02/27/2015   Primary osteoarthritis of right knee Valgus 02/24/2015   Neuropathy 09/07/2013   Breast cancer of upper-outer quadrant of left female breast (HCC) 08/09/2013   Breast cancer of upper-outer quadrant of right female breast (HCC) 08/09/2013   Osteoarthritis of right hip 05/03/2013   Osteoarthritis of left hip 02/15/2013    PCP: Dr. 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  I have trouble finding a comfortable way to sleep. It hurts to eat and drink. Everything burns my throat. I have a numbing medicine to swish and swallow.   PERTINENT HISTORY:  Bil breast cancer in 2008 with lumpectomy and SLNB, then bil mastectomy, Completed chemo and radiation. Nodes removed bil axilla - pt unsure of how many (surgery was in [place])  Then Anal cancer in 2020 with chemotherapy and radiation. Diagnosis of T cell lymphoma in 2023. on chemotherapy and just started palliative radiation to bilateral neck will complete on 06/16/23.   PATIENT GOALS:   to be educated about the signs and symptoms of lymphedema and learn post op HEP.   PAIN:  Are you having pain? Yes: NPRS scale: 8/10 Pain location: throat Pain description: burning Aggravating factors: eating, drinking, swallowing Relieving factors: nothing  PRECAUTIONS: Active CA and Comment (R shoulder replacement, R knee replacement, R THA, L THA)  RED FLAGS: Bowel or bladder incontinence: Yes: hx of anal cancer    WEIGHT BEARING RESTRICTIONS: No  FALLS:  Has patient fallen in last 6 months? No Does the patient have a fear of falling that limits activity? 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  LEISURE: walks 2-3x/wk for about 15 min  PRIOR LEVEL OF FUNCTION: Independent   OBJECTIVE:  COGNITION: Overall cognitive status: Within functional limits for tasks assessed                  POSTURE:  Forward head and rounded shoulders posture  30 SEC SIT TO STAND:  Unable to stand from a chair without use of UEs  SHOULDER AROM:    L WFL, R limited by about 60% and reports it has been this way since shoulder replacement  CERVICAL AROM:   Percent limited  Flexion 50% limited and feels swelling is limiting motion  Extension 25% limited  Right lateral flexion 25% limited  Left lateral flexion WFL  Right rotation 25% limited  Left rotation 25% limited    (Blank rows=not tested)  LYMPHEDEMA ASSESSMENT:    Circumference in cm  4 cm superior to sternal notch around neck 39.9  6 cm superior to sternal notch around neck 39  8 cm superior to sternal notch around neck 39.4  R lateral nostril from base of nose to medial tragus 11.9  L lateral nostril from base of nose to medial tragus 12.2  R corner of mouth to where ear lobe meets face 10.5  L corner of mouth to where ear lobe meets face 10        (Blank rows=not tested)  GAIT: Assessed: Yes Assistance needed: Modified Independent Ambulation Distance: 20 feet Assistive Device: none Gait pattern: partial step through, shortened step length, decreased foot clearance, decreased arm swing bilaterally Ambulation surface: Level  TREATMENT PERFORMED: 06/12/23: Created foam chip pack in juzo cotton stockinette for pt to wear around head and neck for compression to decrease lymphedema  PATIENT  EDUCATION:  Education details: Neck ROM, importance of posture when sitting, standing and lying down, deep breathing, walking program and importance of staying active throughout treatment, CURE article on staying active, "Why exercise?" flyer, lymphedema and PT info Person educated: Patient Education method: Explanation, Demonstration, Handout Education comprehension: Patient verbalized understanding and returned demonstration  HOME EXERCISE PROGRAM: Patient was instructed today in a home exercise program today for head and neck range of motion exercises. These included active cervical flexion, active cervical extension, active cervical rotation to each direction, upper trap stretch, and shoulder retraction. Patient was encouraged to do these 2-3 times a day, holding for 5 sec each and completing for 5 reps. Pt was educated that once this becomes easier then hold the stretches for 30-60 seconds.  Wear foam chip pack for at least 4 hours/day, do not tie too tightly  ASSESSMENT:  CLINICAL IMPRESSION: Pt arrives to PT with recently diagnosed t cell lymphoma cancer. Pt will undergo chemo and palliative radiation. Pt will complete radiation on 06/16/23.  Pt's cervical ROM was limited in all directions at end range and was limited in direction of flexion due to increased neck lymphedema. Educated pt about signs and symptoms of lymphedema as well as anatomy and physiology of lymphatic system. Educated pt in importance of staying as active as possible throughout treatment to decrease fatigue as well as head and neck ROM exercises to decrease loss of ROM. Pt would benefit from skilled PT services to decrease head and neck lymphedema and progress pt towards independent management. Once lymphedema goals are met pt would benefit from evaluation of LE strength and add appropriate goals to POC at that time.   Pt will benefit from skilled therapeutic intervention to improve on the following deficits: Decreased  knowledge of precautions and postural dysfunction. Other deficits: decreased knowledge of condition, decreased knowledge of use of DME, increased edema, increased fascial restrictions, postural dysfunction, and pain  PT treatment/interventions: ADL/self-care home management, pt/family education, therapeutic exercise. Other interventions Therapeutic exercises, Therapeutic activity, Patient/Family education, Self Care, Joint mobilization, Orthotic/Fit training, Manual lymph drainage, Compression bandaging, Taping, Vasopneumatic device, Manual therapy, and Re-evaluation  REHAB POTENTIAL: Good  CLINICAL DECISION MAKING: Stable/uncomplicated  EVALUATION COMPLEXITY: Low   GOALS: Goals reviewed with patient? YES  LONG TERM GOALS: (STG=LTG)   Name Target  Date  Goal status  1 Patient will be able to verbalize understanding of a home exercise program for cervical range of motion, posture, and walking.   Baseline:  No knowledge 06/12/2023 Achieved at eval  2 Patient will be able to verbalize understanding of proper sitting and standing posture. Baseline:  No knowledge 06/12/2023 Achieved at eval  3 Patient will be able to verbalize understanding of lymphedema risk and availability of treatment for this condition Baseline:  No knowledge 06/12/2023 Achieved at eval  4 Pt will be independent in self MLD for long term management of lymphedema. Baseline: See objective measurements taken today. 07/10/23 New  5 Pt will obtain an appropriate compression garmetn for long term management of lymphedema. 07/10/23 NEW  6 Pt will demonstrate a 0.5 cm decrease in edema from R corner of mouth to where ear lobe meets face. 07/10/23 NEW    PLAN:  PT FREQUENCY/DURATION: 2x/wk for 4 wks  PLAN FOR NEXT SESSION: begin MLD to head and neck using [place]on anterior approach handout, show pt tribute night garment and educate, once lymphedema goals are met assess strength and add to POC  Patient will follow up at  outpatient cancer rehab 2 weeks after completion of radiation.  If the patient requires physical therapy at that time, a specific plan will be dictated and sent to the referring physician for approval. The patient was educated today on appropriate basic range of motion exercises to begin now and continue throughout radiation and educated on the signs and symptoms of lymphedema. Patient verbalized good understanding.     Physical Therapy Information for During and After Head/Neck Cancer Treatment: Lymphedema is a swelling condition that you may be at risk for in your neck and/or face if you have radiation treatment to the area and/or if you have surgery that includes removing lymph nodes.  There is treatment available for this condition and it is not life-threatening.  Contact your physician or physical therapist with concerns. An excellent resource for those seeking information on lymphedema is the National Lymphedema Network's website.  It can be accessed at www.lymphnet.org If you notice swelling in your neck or face at any time following surgery (even if it is many years from now), please contact your doctor or physical therapist to discuss this.  Lymphedema can be treated at any time but it is easier for you if it is treated early on. If you have had surgery to your neck, please check with your surgeon about how soon to start doing neck range of motion exercises.  If you are not having surgery, I encourage you to start doing neck range of motion exercises today and continue these while undergoing treatment, UNLESS you have irritation of your skin or soft tissue that is aggravated by doing them.  These exercises are intended to help you prevent loss of range of motion and/or to gain range of motion in your neck (which can be limited by tightening effects of radiation), and NOT to aggravate these tissues if they develop sensitivities from treatment. Neck range of motion exercises should be done to the  point of feeling a GENTLE, TOLERABLE stretch only.  You are encouraged to start a walking or other exercise program tomorrow and continue this as much as you are able through and after treatment.  Please feel free to call me with any questions. Leonette Most, PT, CLT Physical Therapist and Certified Lymphedema Therapist [facility] [address], [place], [place] [address] [phone]  [email or web address]  WALKING  Walking is a great form of exercise to increase your strength, endurance and overall fitness.  A walking program can help you start slowly and gradually build endurance as you go.  Everyone's ability is different, so each person's starting point will be different.  You do not have to follow them exactly.  The are just samples. You should simply find out what's right for you and stick to that program.   In the beginning, you'll start off walking 2-3 times a day for short distances.  As you get stronger, you'll be walking further at just 1-2 times per day.  A. You Can Walk For A Certain Length Of Time Each Day    Walk 5 minutes 3 times per day.  Increase 2 minutes every 2 days (3 times per day).  Work up to 25-30 minutes (1-2 times per day).   Example:   Day 1-2 5 minutes 3 times per day   Day 7-8 12 minutes 2-3 times per day   Day 13-14 25 minutes 1-2 times per day  B. You Can Walk For a Certain Distance Each Day     Distance can be substituted for time.    Example:   3 trips to mailbox (at road)   3 trips to corner of block   3 trips around the block  C. Go to local high school and use the track.    Walk for distance ____ around track  Or time ____ minutes  D. Walk ____ Jog ____ Run ___   Why exercise?  So many benefits! Here are SOME of them: Heart health, including raising your good cholesterol level and reducing heart rate and blood pressure Lung health, including improved lung capacity It burns fats,  and most of [name] can stand to be leaner, whether or not we are overweight. It increases the body's natural painkillers and mood elevators, so makes you feel better. Not only makes you feel better, but look better too Improves sleep Takes a bite out of stress May decrease your risk of many types of cancer If you are currently undergoing cancer treatment, exercise may improve your ability to tolerate treatments including chemotherapy. For everybody, it can improve your energy level. Those with cancer-related fatigue report a 40-50% reduction in this symptom when exercising regularly. If you are a survivor of breast, colon, or prostate cancer, it may decrease your risk of a recurrence. (This may hold for other cancers too, but so far we have data just for these three types.)  How to exercise: Get your doctor's okay. Pick something you enjoy doing, like walking, Zumba, biking, swimming, or whatever. Start at low intensity and time, then gradually increase.  (See walking program handout.) Set a goal to achieve over time.  The American Cancer Society, American Heart Association, and U.S. Dept. of Health and Human Services recommend 150 minutes of moderate exercise, 75 minutes of vigorous exercise, or a combination of both per week. This should be done in episodes at least 10 minutes long, spread throughout the week.  Need help being motivated? Pick something you enjoy doing, because you'll be more inclined to stick with that activity than something that feels like a chore. Do it with a friend so that you are accountable to each other. Schedule it into your day. Place it on your calendar and keep that appointment just like you do any appointment that you make. Join an exercise group that meets at a specific time.  That  way, you have to show up on time, and that makes it harder to procrastinate about doing your workout.  It also keeps you accountable--people begin to expect you to be there. Join a gym  where you feel comfortable and not intimidated, at the right cost. Sign up for something that you'll need to be in shape for on a specific date, like a 1K or a 5K to walk or run, a 20 or 30 mile bike ride, a mud run or something like that. If the date is looming, you know you'll need to train to be ready for it.  An added benefit is that many of these are fundraisers for good causes. If you've already paid for a gym membership, group exercise class or event, you might as well work out, so you haven't wasted your money!    [employer], PT 06/12/2023, 11:02 AM

## 2023-06-12 NOTE — Therapy (Deleted)
OUTPATIENT PHYSICAL THERAPY  UPPER EXTREMITY ONCOLOGY EVALUATION  Patient Name: Cynthia Velasquez MRN: [id number] DOB:1951/01/21, 72 y.o., female Today's Date: 06/12/2023  END OF SESSION:  PT End of Session - 06/12/23 1048     Visit Number 1    Number of Visits 9    Date for PT Re-Evaluation 07/10/23    PT Start Time 1008    PT Stop Time 1048    PT Time Calculation (min) 40 min    Activity Tolerance Patient tolerated treatment well    Behavior During Therapy [facility] for tasks assessed/performed             Past Medical History:  Diagnosis Date   Anal cancer (HCC) dx'd 05/2019   Anxiety    Arthritis    Bilateral breast cancer (HCC) 10/21/2013   Breast cancer (HCC)    Cancer (HCC)    Depression    Gallstones    GERD (gastroesophageal reflux disease)    doesn't take any meds for this   Headache    h/o migraines       History of bladder infections    History of hiatal hernia    History of migraine    Hyperlipidemia    takes Simvastatin daily   Hypertension    takes Hyzaar   Joint pain    Joint swelling    Lumbar stenosis    Neuropathy 09/07/2013   Pneumonia    Pre-diabetes    Past Surgical History:  Procedure Laterality Date   ABDOMINAL HYSTERECTOMY     bone spur removed from left foot     CHOLECYSTECTOMY     COLONOSCOPY     COLONOSCOPY WITH PROPOFOL Bilateral 12/07/2021   Procedure: COLONOSCOPY WITH PROPOFOL;  Surgeon: Vida Rigger, MD;  Location: [facility];  Service: Endoscopy;  Laterality: Bilateral;   double mastectomy      ESOPHAGOGASTRODUODENOSCOPY N/A 12/07/2021   Procedure: ESOPHAGOGASTRODUODENOSCOPY (EGD);  Surgeon: Vida Rigger, MD;  Location: [facility];  Service: Endoscopy;  Laterality: N/A;   HERNIA REPAIR     umbilical   IR IMAGING GUIDED PORT INSERTION  06/25/2022   right knee arthroscopy     right shoulder arthroscopy     surgery for hiatal hernia     TONSILLECTOMY     TOTAL HIP ARTHROPLASTY Left 02/12/2013   TOTAL HIP ARTHROPLASTY Left  02/12/2013   Procedure: LEFT TOTAL HIP ARTHROPLASTY;  Surgeon: Nestor Lewandowsky, MD;  Location: [facility];  Service: Orthopedics;  Laterality: Left;   TOTAL HIP ARTHROPLASTY Right 05/03/2013   Procedure: TOTAL HIP ARTHROPLASTY;  Surgeon: Nestor Lewandowsky, MD;  Location: [facility];  Service: Orthopedics;  Laterality: Right;   TOTAL KNEE ARTHROPLASTY Right 02/27/2015   Procedure: TOTAL KNEE ARTHROPLASTY;  Surgeon: Gean Birchwood, MD;  Location: [facility];  Service: Orthopedics;  Laterality: Right;   TOTAL SHOULDER ARTHROPLASTY Right 09/12/2016   Procedure: RIGHT TOTAL SHOULDER ARTHROPLASTY;  Surgeon: Jones Broom, MD;  Location: [facility];  Service: Orthopedics;  Laterality: Right;  RIGHT TOTAL SHOULDER ARTHROPLASTY   Patient Active Problem List   Diagnosis Date Noted   Syncope and collapse 12/29/2022   Hypomagnesemia 12/29/2022   Perianal pain 11/05/2022   Vaginal candidiasis 11/04/2022   Hypokalemia 10/28/2022   Pancytopenia (HCC) 10/28/2022   Port-A-Cath in place 10/01/2022   Chemotherapy-induced neuropathy (HCC) 09/03/2022   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   Incontinence of feces 09/28/2020   Essential hypertension 09/27/2020  GERD without esophagitis 09/27/2020   History of bilateral mastectomy 09/27/2020   Hyperglycemia 09/27/2020   Moderate recurrent major depression (HCC) 09/27/2020   Pure hypercholesterolemia 09/27/2020   Anal cancer (HCC) 06/13/2019   Status post total shoulder arthroplasty, right 09/12/2016   Arthritis of knee 02/27/2015   Primary osteoarthritis of right knee Valgus 02/24/2015   Neuropathy 09/07/2013   Breast cancer of upper-outer quadrant of left female breast (HCC) 08/09/2013   Breast cancer of upper-outer quadrant of right female breast (HCC) 08/09/2013   Osteoarthritis of right hip 05/03/2013   Osteoarthritis of left hip 02/15/2013    PCP: ***  REFERRING PROVIDER: ***  REFERRING DIAG:  ***  THERAPY DIAG:  Lymphedema, not elsewhere classified  Disorder of the skin and subcutaneous tissue related to radiation, unspecified  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 It hurts to eat. My saliva is as thick as paste. I have a hard time finding a pla OUTPATIENT PHYSICAL THERAPY HEAD AND NECK BASELINE EVALUATION   Patient Name: Cynthia Velasquez MRN: [id number] DOB:February 23, 1951, 72 y.o., female Today's Date: 06/12/2023  END OF SESSION:  PT End of Session - 06/12/23 1048     Visit Number 1    Number of Visits 9    Date for PT Re-Evaluation 07/10/23    PT Start Time 1008    PT Stop Time 1048    PT Time Calculation (min) 40 min    Activity Tolerance Patient tolerated treatment well    Behavior During Therapy [facility] for tasks assessed/performed             Past Medical History:  Diagnosis Date   Anal cancer (HCC) dx'd 05/2019   Anxiety    Arthritis    Bilateral breast cancer (HCC) 10/21/2013   Breast cancer (HCC)    Cancer (HCC)    Depression    Gallstones    GERD (gastroesophageal reflux disease)    doesn't take any meds for this   Headache    h/o migraines       History of bladder infections    History of hiatal hernia    History of migraine    Hyperlipidemia    takes Simvastatin daily   Hypertension    takes Hyzaar   Joint pain  Joint swelling    Lumbar stenosis    Neuropathy 09/07/2013   Pneumonia    Pre-diabetes    Past Surgical History:  Procedure Laterality Date   ABDOMINAL HYSTERECTOMY     bone spur removed from left foot     CHOLECYSTECTOMY     COLONOSCOPY     COLONOSCOPY WITH PROPOFOL Bilateral  12/07/2021   Procedure: COLONOSCOPY WITH PROPOFOL;  Surgeon: Vida Rigger, MD;  Location: [facility];  Service: Endoscopy;  Laterality: Bilateral;   double mastectomy      ESOPHAGOGASTRODUODENOSCOPY N/A 12/07/2021   Procedure: ESOPHAGOGASTRODUODENOSCOPY (EGD);  Surgeon: Vida Rigger, MD;  Location: [facility];  Service: Endoscopy;  Laterality: N/A;   HERNIA REPAIR     umbilical   IR IMAGING GUIDED PORT INSERTION  06/25/2022   right knee arthroscopy     right shoulder arthroscopy     surgery for hiatal hernia     TONSILLECTOMY     TOTAL HIP ARTHROPLASTY Left 02/12/2013   TOTAL HIP ARTHROPLASTY Left 02/12/2013   Procedure: LEFT TOTAL HIP ARTHROPLASTY;  Surgeon: Nestor Lewandowsky, MD;  [facility];  Service: Orthopedics;  Laterality: Left;   TOTAL HIP ARTHROPLASTY Right 05/03/2013   Procedure: TOTAL HIP ARTHROPLASTY;  Surgeon: Nestor Lewandowsky, MD;  [facility];  Service: Orthopedics;  Laterality: Right;   TOTAL KNEE ARTHROPLASTY Right 02/27/2015   Procedure: TOTAL KNEE ARTHROPLASTY;  Surgeon: Gean Birchwood, MD;  [facility];  Service: Orthopedics;  Laterality: Right;   TOTAL SHOULDER ARTHROPLASTY Right 09/12/2016   Procedure: RIGHT TOTAL SHOULDER ARTHROPLASTY;  Surgeon: Jones Broom, MD;  [facility];  Service: Orthopedics;  Laterality: Right;  RIGHT TOTAL SHOULDER ARTHROPLASTY   Patient Active Problem List   Diagnosis Date Noted   Syncope and collapse 12/29/2022   Hypomagnesemia 12/29/2022   Perianal pain 11/05/2022   Vaginal candidiasis 11/04/2022   Hypokalemia 10/28/2022   Pancytopenia (HCC) 10/28/2022   Port-A-Cath in place 10/01/2022   Chemotherapy-induced neuropathy (HCC) 09/03/2022   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   Incontinence of feces 09/28/2020   Essential hypertension 09/27/2020   GERD without esophagitis 09/27/2020   History of bilateral mastectomy 09/27/2020   Hyperglycemia  09/27/2020   Moderate recurrent major depression (HCC) 09/27/2020   Pure hypercholesterolemia 09/27/2020   Anal cancer (HCC) 06/13/2019   Status post total shoulder arthroplasty, right 09/12/2016   Arthritis of knee 02/27/2015   Primary osteoarthritis of right knee Valgus 02/24/2015   Neuropathy 09/07/2013   Breast cancer of upper-outer quadrant of left female breast (HCC) 08/09/2013   Breast cancer of upper-outer quadrant of right female breast (HCC) 08/09/2013   Osteoarthritis of right hip 05/03/2013   Osteoarthritis of left hip 02/15/2013    PCP: Dr. 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  I  have trouble finding a comfortable way to sleep. It hurts to eat and drink. Everything burns my throat. I have a numbing medicine to swish and swallow.   PERTINENT HISTORY:  Bil breast cancer in 2008 with lumpectomy and SLNB, then bil mastectomy, Completed chemo and radiation. Nodes removed bil axilla - pt unsure of how many (surgery was in [place])  Then Anal cancer in 2020 with chemotherapy and radiation. Diagnosis of T cell lymphoma in 2023. on chemotherapy and just started palliative radiation to bilateral neck will complete on 06/16/23.   PATIENT GOALS:   to be educated about the signs and symptoms of lymphedema and learn post op HEP.    PAIN:  Are you having pain? Yes: NPRS scale: 8/10 Pain location: throat Pain description: burning Aggravating factors: eating, drinking, swallowing Relieving factors: nothing  PRECAUTIONS: Active CA and Comment (R shoulder replacement, R knee replacement, R THA, L THA)  RED FLAGS: Bowel or bladder incontinence: Yes: hx of anal cancer    WEIGHT BEARING RESTRICTIONS: No  FALLS:  Has patient fallen in last 6 months? No Does the patient have a fear of falling that limits activity? 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  LEISURE: walks 2-3x/wk for about 15 min  PRIOR LEVEL OF FUNCTION: Independent   OBJECTIVE:  COGNITION: Overall cognitive status: Within functional limits for tasks assessed                  POSTURE:  Forward head and rounded shoulders posture  30 SEC SIT TO STAND:  Unable to stand from a chair without use of UEs  SHOULDER AROM:    L WFL, R limited by about 60% and reports it has been this way since shoulder replacement   CERVICAL AROM:   Percent limited  Flexion 50% limited and feels swelling is limiting motion  Extension 25% limited  Right lateral flexion 25% limited  Left lateral flexion WFL  Right rotation 25% limited  Left rotation 25% limited    (Blank rows=not tested)  LYMPHEDEMA ASSESSMENT:    Circumference in cm  4 cm superior to sternal notch around neck 39.9  6 cm superior to sternal notch around neck 39  8 cm superior to sternal notch around neck 39.4  R lateral nostril from base of nose to medial tragus 11.9  L lateral nostril from base of nose to medial tragus 12.2  R corner of mouth to where ear lobe meets face 10.5  L corner of  mouth to where ear lobe meets face 10        (Blank rows=not tested)  GAIT: Assessed: Yes Assistance needed: Modified Independent Ambulation Distance: 20 feet Assistive Device: none Gait pattern: partial step through, shortened step length, decreased foot clearance, decreased arm swing bilaterally Ambulation surface: Level  TREATMENT PERFORMED: 06/12/23: Created foam chip pack in juzo cotton stockinette for pt to wear around head and neck for compression to decrease lymphedema  PATIENT EDUCATION:  Education details: Neck ROM, importance of posture when sitting, standing and lying down, deep breathing, walking program and importance of staying active throughout treatment, CURE article on staying active, "Why exercise?" flyer, lymphedema and PT info Person educated: Patient Education method: Explanation, Demonstration,  Handout Education comprehension: Patient verbalized understanding and returned demonstration  HOME EXERCISE PROGRAM: Patient was instructed today in a home exercise program today for head and neck range of motion exercises. These included active cervical flexion, active cervical extension, active cervical rotation to each direction, upper trap stretch, and shoulder retraction. Patient was encouraged to do these 2-3 times a day, holding for 5 sec each and completing for 5 reps. Pt was educated that once this becomes easier then hold the stretches for 30-60 seconds.  Wear foam chip pack for at least 4 hours/day, do not tie too tightly  ASSESSMENT:  CLINICAL IMPRESSION: Pt arrives to PT with recently diagnosed t cell lymphoma cancer. Pt will undergo chemo and palliative radiation. Pt will complete radiation on 06/16/23.  Pt's cervical ROM was limited in all directions at end range and was limited in direction of flexion due to increased neck lymphedema. Educated pt about signs and symptoms of lymphedema as well as anatomy and physiology of lymphatic system. Educated pt in  importance of staying as active as possible throughout treatment to decrease fatigue as well as head and neck ROM exercises to decrease loss of ROM. Pt would benefit from skilled PT services to decrease head and neck lymphedema and progress pt towards independent management. Once lymphedema goals are met pt would benefit from evaluation of LE strength and add appropriate goals to POC at that time.   Pt will benefit from skilled therapeutic intervention to improve on the following deficits: Decreased knowledge of precautions and postural dysfunction. Other deficits: decreased knowledge of condition, decreased knowledge of use of DME, increased edema, increased fascial restrictions, postural dysfunction, and pain  PT treatment/interventions: ADL/self-care home management, pt/family education, therapeutic exercise. Other interventions Therapeutic exercises, Therapeutic activity, Patient/Family education, Self Care, Joint mobilization, Orthotic/Fit training, Manual lymph drainage, Compression bandaging, Taping, Vasopneumatic device, Manual therapy, and Re-evaluation  REHAB POTENTIAL: Good  CLINICAL DECISION MAKING: Stable/uncomplicated  EVALUATION COMPLEXITY: Low   GOALS: Goals reviewed with patient? YES  LONG TERM GOALS: (STG=LTG)   Name Target Date  Goal status  1 Patient will be able to verbalize understanding of a home exercise program for cervical range of motion, posture, and walking.   Baseline:  No knowledge 06/12/2023 Achieved at eval  2 Patient will be able to verbalize understanding of proper sitting and standing posture. Baseline:  No knowledge 06/12/2023 Achieved at eval  3 Patient will be able to verbalize understanding of lymphedema risk and availability of treatment for this condition Baseline:  No knowledge 06/12/2023 Achieved at eval  4 Pt will be independent in self MLD for long term management of lymphedema. Baseline: See objective measurements taken today. 07/10/23 New  5 Pt  will obtain an appropriate compression garmetn for long term management of lymphedema. 07/10/23 NEW  6 Pt will demonstrate a 0.5 cm decrease in edema from R corner of mouth to where ear lobe meets face. 07/10/23 NEW    PLAN:  PT FREQUENCY/DURATION: 2x/wk for 4 wks  PLAN FOR NEXT SESSION: begin MLD to head and neck using norton anterior approach handout, show pt tribute night garment and educate, once lymphedema goals are met assess strength and add to POC  Patient will follow up at outpatient cancer rehab 2 weeks after completion of radiation.  If the patient requires physical therapy at that time, a specific plan will be dictated and sent to the referring physician for approval. The patient was educated today on appropriate basic range of motion exercises to begin now  and continue throughout radiation and educated on the signs and symptoms of lymphedema. Patient verbalized good understanding.     Physical Therapy Information for During and After Head/Neck Cancer Treatment: Lymphedema is a swelling condition that you may be at risk for in your neck and/or face if you have radiation treatment to the area and/or if you have surgery that includes removing lymph nodes.  There is treatment available for this condition and it is not life-threatening.  Contact your physician or physical therapist with concerns. An excellent resource for those seeking information on lymphedema is the National Lymphedema Network's website.  It can be accessed at www.lymphnet.org If you notice swelling in your neck or face at any time following surgery (even if it is many years from now), please contact your doctor or physical therapist to discuss this.  Lymphedema can be treated at any time but it is easier for you if it is treated early on. If you have had surgery to your neck, please check with your surgeon about how soon to start doing neck range of motion exercises.  If you are not having surgery, I encourage you to  start doing neck range of motion exercises today and continue these while undergoing treatment, UNLESS you have irritation of your skin or soft tissue that is aggravated by doing them.  These exercises are intended to help you prevent loss of range of motion and/or to gain range of motion in your neck (which can be limited by tightening effects of radiation), and NOT to aggravate these tissues if they develop sensitivities from treatment. Neck range of motion exercises should be done to the point of feeling a GENTLE, TOLERABLE stretch only.  You are encouraged to start a walking or other exercise program tomorrow and continue this as much as you are able through and after treatment.  Please feel free to call me with any questions. Leonette Most, PT, CLT Physical Therapist and Certified Lymphedema Therapist [facility] [address], [place], [place] [address] [phone] [email or web address]  WALKING  Walking is a great form of exercise to increase your strength, endurance and overall fitness.  A walking program can help you start slowly and gradually build endurance as you go.  Everyone's ability is different, so each person's starting point will be different.  You do not have to follow them exactly.  The are just samples. You should simply find out what's right for you and stick to that program.   In the beginning, you'll start off walking 2-3 times a day for short distances.  As you get stronger, you'll be walking further at just 1-2 times per day.  A. You Can Walk For A Certain Length Of Time Each Day    Walk 5 minutes 3 times per day.  Increase 2 minutes every 2 days (3 times per day).  Work up to 25-30 minutes (1-2 times per day).   Example:   Day 1-2 5 minutes 3 times per day   Day 7-8 12 minutes 2-3 times per day   Day 13-14 25 minutes 1-2 times per day  B. You Can Walk For a Certain Distance Each Day     Distance can be  substituted for time.    Example:   3 trips to mailbox (at road)   3 trips to corner of block   3 trips around the block  C. Go to local high school and use the track.    Walk for distance  ____ around track  Or time ____ minutes  D. Walk ____ Jog ____ Run ___   Why exercise?  So many benefits! Here are SOME of them: Heart health, including raising your good cholesterol level and reducing heart rate and blood pressure Lung health, including improved lung capacity It burns fats, and most of [name] can stand to be leaner, whether or not we are overweight. It increases the body's natural painkillers and mood elevators, so makes you feel better. Not only makes you feel better, but look better too Improves sleep Takes a bite out of stress May decrease your risk of many types of cancer If you are currently undergoing cancer treatment, exercise may improve your ability to tolerate treatments including chemotherapy. For everybody, it can improve your energy level. Those with cancer-related fatigue report a 40-50% reduction in this symptom when exercising regularly. If you are a survivor of breast, colon, or prostate cancer, it may decrease your risk of a recurrence. (This may hold for other cancers too, but so far we have data just for these three types.)  How to exercise: Get your doctor's okay. Pick something you enjoy doing, like walking, Zumba, biking, swimming, or whatever. Start at low intensity and time, then gradually increase.  (See walking program handout.) Set a goal to achieve over time.  The American Cancer Society, American Heart Association, and U.S. Dept. of Health and Human Services recommend 150 minutes of moderate exercise, 75 minutes of vigorous exercise, or a combination of both per week. This should be done in episodes at least 10 minutes long, spread throughout the week.  Need help being motivated? Pick something you enjoy doing, because you'll be more inclined to stick  with that activity than something that feels like a chore. Do it with a friend so that you are accountable to each other. Schedule it into your day. Place it on your calendar and keep that appointment just like you do any appointment that you make. Join an exercise group that meets at a specific time.  That way, you have to show up on time, and that makes it harder to procrastinate about doing your workout.  It also keeps you accountable--people begin to expect you to be there. Join a gym where you feel comfortable and not intimidated, at the right cost. Sign up for something that you'll need to be in shape for on a specific date, like a 1K or a 5K to walk or run, a 20 or 30 mile bike ride, a mud run or something like that. If the date is looming, you know you'll need to train to be ready for it.  An added benefit is that many of these are fundraisers for good causes. If you've already paid for a gym membership, group exercise class or event, you might as well work out, so you haven't wasted your money!    [facility] [place], PT 06/12/2023, 10:58 AM                         PERTINENT HISTORY: ***  PAIN:  Are you having pain? {yes/no:20286} NPRS scale: ***/10 Pain location: *** Pain orientation: {Pain Orientation:25161}  PAIN TYPE: {type:313116} Pain description: {PAIN DESCRIPTION:21022940}  Aggravating factors: *** Relieving factors: ***  PRECAUTIONS: {Therapy precautions:24002}  RED FLAGS: {PT Red Flags:29287}   WEIGHT BEARING RESTRICTIONS: {Yes ***/No:24003}  FALLS:  Has patient fallen in last 6 months? {fallsyesno:27318}  LIVING ENVIRONMENT: Lives with: {[facility] lives with:25569::"lives with their  family"} Lives in: {Lives in:25570} Stairs: {yes/no:20286}; {Stairs:24000} Has following equipment at home: {Assistive devices:23999}  OCCUPATION: ***  LEISURE: ***  HAND DOMINANCE: {RIGHT/LEFT:21944}   PRIOR LEVEL OF FUNCTION: {PLOF:24004}  PATIENT GOALS:  ***   OBJECTIVE:  COGNITION: Overall cognitive status: {cognition:24006}   PALPATION: ***  OBSERVATIONS / OTHER ASSESSMENTS: ***  SENSATION: Light touch: {intact/deficits:24005} Stereognosis: {intact/deficits:24005} Hot/Cold: {intact/deficits:24005} Proprioception: {intact/deficits:24005}  POSTURE: ***  UPPER EXTREMITY AROM/PROM:  A/PROM RIGHT   eval   Shoulder extension   Shoulder flexion   Shoulder abduction   Shoulder internal rotation   Shoulder external rotation     (Blank rows = not tested)  A/PROM LEFT   eval  Shoulder extension   Shoulder flexion   Shoulder abduction   Shoulder internal rotation   Shoulder external rotation     (Blank rows = not tested)  CERVICAL AROM: All within normal limits:    Percent limited  Flexion   Extension   Right lateral flexion   Left lateral flexion   Right rotation   Left rotation     UPPER EXTREMITY STRENGTH:   LYMPHEDEMA ASSESSMENTS:   SURGERY TYPE/DATE: ***  NUMBER OF LYMPH NODES REMOVED: ***  CHEMOTHERAPY: ***  RADIATION:***  HORMONE TREATMENT: ***  INFECTIONS: ***   LYMPHEDEMA ASSESSMENTS:   LANDMARK RIGHT  eval  At axilla    15 cm proximal to olecranon process   10 cm proximal to olecranon process   Olecranon process   15 cm proximal to ulnar styloid process   10 cm proximal to ulnar styloid process   Just proximal to ulnar styloid process   Across hand at thumb web space   At base of 2nd digit   (Blank rows = not tested)  LANDMARK LEFT  eval  At axilla    15 cm proximal to olecranon process   10 cm proximal to olecranon process   Olecranon process   15 cm proximal to ulnar styloid process   10 cm proximal to ulnar styloid process   Just proximal to ulnar styloid process   Across hand at thumb web space   At base of 2nd digit   (Blank rows = not tested)   FUNCTIONAL TESTS:  {Functional tests:24029}  GAIT: Distance walked: *** Assistive device utilized: {Assistive  devices:23999} Level of assistance: {Levels of assistance:24026} Comments: ***  L-DEX LYMPHEDEMA SCREENING: The patient was assessed using the L-Dex machine today to produce a lymphedema index baseline score. The patient will be reassessed on a regular basis (typically every 3 months) to obtain new L-Dex scores. If the score is > 6.5 points away from his/her baseline score indicating onset of subclinical lymphedema, it will be recommended to wear a compression garment for 4 weeks, 12 hours per day and then be reassessed. If the score continues to be > 6.5 points from baseline at reassessment, we will initiate lymphedema treatment. Assessing in this manner has a 95% rate of preventing clinically significant lymphedema.  QUICK DASH SURVEY: ***   TODAY'S TREATMENT:  DATE: ***    PATIENT EDUCATION:  Education details: *** Person educated: {Person educated:25204} Education method: {Education Method:25205} Education comprehension: {Education Comprehension:25206}  HOME EXERCISE PROGRAM: ***  ASSESSMENT:  CLINICAL IMPRESSION: Patient is a *** y.o. *** who was seen today for physical therapy evaluation and treatment for ***.    OBJECTIVE IMPAIRMENTS: {opptimpairments:25111}.   ACTIVITY LIMITATIONS: {activitylimitations:27494}  PARTICIPATION LIMITATIONS: {participationrestrictions:25113}  PERSONAL FACTORS: {Personal factors:25162} are also affecting patient's functional outcome.   REHAB POTENTIAL: {rehabpotential:25112}  CLINICAL DECISION MAKING: {clinical decision making:25114}  EVALUATION COMPLEXITY: {Evaluation complexity:25115}  GOALS: Goals reviewed with patient? {yes/no:20286}  SHORT TERM GOALS: Target date: ***  *** Baseline: Goal status: INITIAL  2.  *** Baseline:  Goal status: INITIAL  3.  *** Baseline:  Goal status:  INITIAL  4.  *** Baseline:  Goal status: INITIAL  5.  *** Baseline:  Goal status: INITIAL  6.  *** Baseline:  Goal status: INITIAL  LONG TERM GOALS: Target date: ***  *** Baseline:  Goal status: INITIAL  2.  *** Baseline:  Goal status: INITIAL  3.  *** Baseline:  Goal status: INITIAL  4.  *** Baseline:  Goal status: INITIAL  5.  *** Baseline:  Goal status: INITIAL  6.  *** Baseline:  Goal status: INITIAL  PLAN:  PT FREQUENCY: {rehab frequency:25116}  PT DURATION: {rehab duration:25117}  PLANNED INTERVENTIONS: {rehab planned interventions:25118::"Therapeutic exercises","Therapeutic activity","Neuromuscular re-education","Balance training","Gait training","Patient/Family education","Self Care","Joint mobilization"}  PLAN FOR NEXT SESSION: ***  Leonette Most, PT 06/12/2023, 10:58 AM

## 2023-06-13 ENCOUNTER — Other Ambulatory Visit: Payer: Self-pay

## 2023-06-13 ENCOUNTER — Other Ambulatory Visit: Payer: Self-pay | Admitting: Radiation Oncology

## 2023-06-13 ENCOUNTER — Ambulatory Visit
Admission: RE | Admit: 2023-06-13 | Discharge: 2023-06-13 | Disposition: A | Discharge status: Home / Self Care | Payer: PPO | Source: Ambulatory Visit | Attending: Radiation Oncology | Admitting: Radiation Oncology

## 2023-06-13 DIAGNOSIS — C8448 Peripheral T-cell lymphoma, not classified, lymph nodes of multiple sites: Secondary | ICD-10-CM

## 2023-06-13 DIAGNOSIS — C8441 Peripheral T-cell lymphoma, not classified, lymph nodes of head, face, and neck: Secondary | ICD-10-CM | POA: Diagnosis not present

## 2023-06-13 DIAGNOSIS — Z510 Encounter for antineoplastic radiation therapy: Secondary | ICD-10-CM | POA: Diagnosis not present

## 2023-06-13 LAB — RAD ONC ARIA SESSION SUMMARY
Course Elapsed Days: 17
Plan Fractions Treated to Date: 14
Plan Prescribed Dose Per Fraction: 2 Gy
Plan Total Fractions Prescribed: 15
Plan Total Prescribed Dose: 30 Gy
Reference Point Dosage Given to Date: 28 Gy
Reference Point Session Dosage Given: 2 Gy
Session Number: 15

## 2023-06-13 MED ORDER — OXYCODONE-ACETAMINOPHEN 5-325 MG/5ML PO SOLN: 5.0000 mL | Freq: Four times a day (QID) | ORAL | 0 refills | Status: DC | PRN

## 2023-06-13 MED ORDER — MORPHINE SULFATE (CONCENTRATE) 10 MG /0.5 ML PO SOLN
10.0000 mg | Freq: Four times a day (QID) | ORAL | 0 refills | Status: DC | PRN
Start: 2023-06-13 — End: 2023-06-27

## 2023-06-13 NOTE — Progress Notes (Signed)
I was contacted by the oncall nurse that patient cannot afford oxycodone liquid.  I verified this w/ [facility] pharmacist, cancelled that Rx, and changed it to Morphine liquid.  Nurse will notify patient that new Rx will be available for pick up  -----------------------------------  Lonie Peak, MD

## 2023-06-16 ENCOUNTER — Ambulatory Visit: Payer: PPO

## 2023-06-16 ENCOUNTER — Ambulatory Visit
Admission: RE | Admit: 2023-06-16 | Discharge: 2023-06-16 | Disposition: A | Discharge status: Home / Self Care | Payer: PPO | Source: Ambulatory Visit | Attending: Radiation Oncology | Admitting: Radiation Oncology

## 2023-06-16 ENCOUNTER — Other Ambulatory Visit: Payer: Self-pay

## 2023-06-16 DIAGNOSIS — C8441 Peripheral T-cell lymphoma, not classified, lymph nodes of head, face, and neck: Secondary | ICD-10-CM | POA: Diagnosis not present

## 2023-06-16 DIAGNOSIS — Z510 Encounter for antineoplastic radiation therapy: Secondary | ICD-10-CM | POA: Diagnosis not present

## 2023-06-16 LAB — RAD ONC ARIA SESSION SUMMARY
Course Elapsed Days: 20
Plan Fractions Treated to Date: 15
Plan Prescribed Dose Per Fraction: 2 Gy
Plan Total Fractions Prescribed: 15
Plan Total Prescribed Dose: 30 Gy
Reference Point Dosage Given to Date: 30 Gy
Reference Point Session Dosage Given: 2 Gy
Session Number: 16

## 2023-06-17 ENCOUNTER — Ambulatory Visit: Payer: PPO

## 2023-06-17 NOTE — Radiation Completion Notes (Signed)
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 The patient tolerated radiation. She developed fatigue  as well as esophagitis during treatment.  The patient will receive a call in about one month from the radiation oncology department. She will continue follow up with Dr. Candise Che as well.      Osker Mason, PAC

## 2023-06-19 ENCOUNTER — Emergency Department (HOSPITAL_COMMUNITY)
Admission: EM | Admit: 2023-06-19 | Discharge: 2023-06-19 | Disposition: A | Discharge status: Home / Self Care | Payer: PPO | Attending: Emergency Medicine | Admitting: Emergency Medicine

## 2023-06-19 DIAGNOSIS — R0902 Hypoxemia: Secondary | ICD-10-CM | POA: Diagnosis not present

## 2023-06-19 DIAGNOSIS — R4182 Altered mental status, unspecified: Secondary | ICD-10-CM | POA: Diagnosis present

## 2023-06-19 DIAGNOSIS — T402X1A Poisoning by other opioids, accidental (unintentional), initial encounter: Secondary | ICD-10-CM | POA: Diagnosis not present

## 2023-06-19 DIAGNOSIS — T40601A Poisoning by unspecified narcotics, accidental (unintentional), initial encounter: Secondary | ICD-10-CM

## 2023-06-19 DIAGNOSIS — I10 Essential (primary) hypertension: Secondary | ICD-10-CM | POA: Diagnosis not present

## 2023-06-19 DIAGNOSIS — Z85048 Personal history of other malignant neoplasm of rectum, rectosigmoid junction, and anus: Secondary | ICD-10-CM | POA: Insufficient documentation

## 2023-06-19 DIAGNOSIS — E876 Hypokalemia: Secondary | ICD-10-CM | POA: Insufficient documentation

## 2023-06-19 DIAGNOSIS — Z853 Personal history of malignant neoplasm of breast: Secondary | ICD-10-CM | POA: Insufficient documentation

## 2023-06-19 DIAGNOSIS — R400 Somnolence: Secondary | ICD-10-CM | POA: Diagnosis not present

## 2023-06-19 DIAGNOSIS — R000 Tachycardia, unspecified: Secondary | ICD-10-CM | POA: Diagnosis not present

## 2023-06-19 LAB — COMPREHENSIVE METABOLIC PANEL
ALT: 14 U/L (ref 0–44)
AST: 18 U/L (ref 15–41)
Albumin: 3.8 g/dL (ref 3.5–5.0)
Alkaline Phosphatase: 71 U/L (ref 38–126)
Anion gap: 15 (ref 5–15)
BUN: 24 mg/dL — ABNORMAL HIGH (ref 8–23)
CO2: 23 mmol/L (ref 22–32)
Calcium: 9.7 mg/dL (ref 8.9–10.3)
Chloride: 105 mmol/L (ref 98–111)
Creatinine, Ser: 1.65 mg/dL — ABNORMAL HIGH (ref 0.44–1.00)
GFR, Estimated: 33 mL/min — ABNORMAL LOW (ref >60)
Glucose, Bld: 93 mg/dL (ref 70–99)
Potassium: 3.3 mmol/L — ABNORMAL LOW (ref 3.5–5.1)
Sodium: 143 mmol/L (ref 135–145)
Total Bilirubin: 0.8 mg/dL (ref 0.3–1.2)
Total Protein: 7.4 g/dL (ref 6.5–8.1)

## 2023-06-19 LAB — CBC WITH DIFFERENTIAL/PLATELET
Abs Immature Granulocytes: 0.01 10*3/uL (ref 0.00–0.07)
Basophils Absolute: 0 10*3/uL (ref 0.0–0.1)
Basophils Relative: 1 %
Eosinophils Absolute: 0 10*3/uL (ref 0.0–0.5)
Eosinophils Relative: 1 %
HCT: 37.7 % (ref 36.0–46.0)
Hemoglobin: 11.6 g/dL — ABNORMAL LOW (ref 12.0–15.0)
Immature Granulocytes: 0 %
Lymphocytes Relative: 5 %
Lymphs Abs: 0.2 10*3/uL — ABNORMAL LOW (ref 0.7–4.0)
MCH: 27.1 pg (ref 26.0–34.0)
MCHC: 30.8 g/dL (ref 30.0–36.0)
MCV: 88.1 fL (ref 80.0–100.0)
Monocytes Absolute: 0.6 10*3/uL (ref 0.1–1.0)
Monocytes Relative: 17 %
Neutro Abs: 3 10*3/uL (ref 1.7–7.7)
Neutrophils Relative %: 76 %
Platelets: 193 10*3/uL (ref 150–400)
RBC: 4.28 MIL/uL (ref 3.87–5.11)
RDW: 18.6 % — ABNORMAL HIGH (ref 11.5–15.5)
WBC: 3.8 10*3/uL — ABNORMAL LOW (ref 4.0–10.5)
nRBC: 0 % (ref 0.0–0.2)

## 2023-06-19 MED ORDER — NALOXONE HCL 0.4 MG/ML IJ SOLN
0.4000 mg | Freq: Once | INTRAMUSCULAR | Status: AC
  Administered 2023-06-19: 0.4 mg via INTRAVENOUS
  Filled 2023-06-19: qty 1

## 2023-06-19 MED ORDER — NALOXONE HCL 4 MG/0.1ML NA LIQD: NASAL | 3 refills | Status: DC

## 2023-06-19 MED ORDER — SODIUM CHLORIDE 0.9 % IV BOLUS
1000.0000 mL | Freq: Once | INTRAVENOUS | Status: AC
  Administered 2023-06-19: 1000 mL via INTRAVENOUS

## 2023-06-19 NOTE — ED Triage Notes (Signed)
Daughter reports pt prescribed oral morphine, took first dose last night, unsure of time or how much pt took per EMS. Daughter reports to EMS ,pt pt not acting like self, hx of non-hodgkin's lymphoma, last radiation Monday, EMS reports finished, Pt able to answer questions in triage

## 2023-06-19 NOTE — Discharge Instructions (Signed)
I think you had too much of your medication.  It got to the point where you were barely breathing.  We gave you the antidotes and it woke you up.  I prescribed this for you if you need it at home.  Please discuss this with your oncologist.

## 2023-06-19 NOTE — ED Provider Notes (Signed)
[place] EMERGENCY DEPARTMENT AT [facility] Provider Note   CSN: [id number] Arrival date & time: 06/19/23  4098     History  Chief Complaint  Patient presents with   Altered Mental Status    Cynthia Velasquez is a 72 y.o. female.  72 yo F with a cc of altered mental status.  This was reported by EMS.  Patient is not exactly sure why she is here but says that her daughter made her come.  She said that her daughter maybe had trouble waking her up earlier.  She has trouble telling me exactly what transpired.  Per EMS the patient had recently been prescribed morphine elixir.  Had taken her first dose this evening.        Home Medications Prior to Admission medications   Medication Sig Start Date End Date Taking? Authorizing Provider  naloxone [facility]) nasal spray 4 mg/0.1 mL 1 squirt in the naris for unresponsiveness 06/19/23  Yes Melene Plan, DO  aspirin-acetaminophen-caffeine (EXCEDRIN MIGRAINE) [phone] MG tablet Take 2 tablets by mouth every 6 (six) hours as needed for migraine.    [provider]  DULoxetine (CYMBALTA) 60 MG capsule Take 1 capsule (60 mg total) by mouth 2 (two) times daily. Take 1 capsule (30 mg) at bedtime for 1 week, if no side effects, then increase to 2 capsules (60 mg) thereafter. 12/20/22   Antony Madura, MD  lidocaine (LMX) 4 % cream Apply topically 2 (two) times daily as needed (pain). 11/05/22   Drema Dallas, MD  losartan (COZAAR) 100 MG tablet Take 100 mg by mouth daily. 01/14/17   [provider]  Menthol-Methyl Salicylate (SALONPAS PAIN RELIEF PATCH EX) Apply 1 patch topically daily as needed (pain).    [provider]  methocarbamol (ROBAXIN) 750 MG tablet Take 1 tablet (750 mg total) by mouth every 8 (eight) hours as needed for muscle spasms. 01/08/23   [name], MD  Morphine Sulfate (MORPHINE CONCENTRATE) 10 mg / 0.5 ml concentrated solution Take 0.5 mLs (10 mg total) by mouth every 6 (six) hours as  needed for severe pain. 06/13/23   Lonie Peak, MD  Nystatin (GERHARDT'S BUTT CREAM) CREA Apply 1 Application topically 2 (two) times daily. Patient taking differently: Apply 1 Application topically 2 (two) times daily as needed for irritation. 11/05/22   Drema Dallas, MD  nystatin (MYCOSTATIN) 100000 UNIT/ML suspension Take 5 mLs (500,000 Units total) by mouth 4 (four) times daily as needed. Swish and swallow 03/03/23   [name], MD  nystatin (MYCOSTATIN/NYSTOP) powder Apply 1 Application topically 3 (three) times daily. Patient taking differently: Apply 1 Application topically 3 (three) times daily as needed (redness, rash). 08/30/22   Walisiewicz, Kaitlyn E, PA-C  ondansetron (ZOFRAN) 8 MG tablet Take 1 tablet (8 mg total) by mouth every 8 (eight) hours as needed for nausea or vomiting. 02/07/23   [name], MD  pantoprazole (PROTONIX) 40 MG tablet Take 1 tablet (40 mg total) by mouth daily. Patient taking differently: Take 40 mg by mouth daily as needed (for acid reflux). 11/06/22   Drema Dallas, MD  prochlorperazine (COMPAZINE) 10 MG tablet Take 1 tablet (10 mg total) by mouth every 6 (six) hours as needed for nausea or vomiting. 02/07/23   [name], MD  triamcinolone ointment (KENALOG) 0.5 % Apply 1 Application topically 2 (two) times daily. To rash under both armpits 05/20/23   [name], MD      Allergies  Tramadol, Belinostat, Codeine, and Vicodin [hydrocodone-acetaminophen]    Review of Systems   Review of Systems  Physical Exam Updated Vital Signs BP 108/72 (BP Location: Right Arm)   Pulse 80   Resp 15   SpO2 91%  Physical Exam Vitals and nursing note reviewed.  Constitutional:      General: She is not in acute distress.    Appearance: She is well-developed. She is not diaphoretic.  HENT:     Head: Normocephalic and atraumatic.  Eyes:     Pupils: Pupils are equal, round, and reactive to light.  Cardiovascular:     Rate and  Rhythm: Normal rate and regular rhythm.     Heart sounds: No murmur heard.    No friction rub. No gallop.  Pulmonary:     Effort: Pulmonary effort is normal.     Breath sounds: No wheezing or rales.  Abdominal:     General: There is no distension.     Palpations: Abdomen is soft.     Tenderness: There is no abdominal tenderness.  Musculoskeletal:        General: No tenderness.     Cervical back: Normal range of motion and neck supple.  Skin:    General: Skin is warm and dry.  Neurological:     Mental Status: She is alert and oriented to person, place, and time.  Psychiatric:        Behavior: Behavior normal.     ED Results / Procedures / Treatments   Labs (all labs ordered are listed, but only abnormal results are displayed) Labs Reviewed  CBC WITH DIFFERENTIAL/PLATELET - Abnormal; Notable for the following components:      Result Value   WBC 3.8 (*)    Hemoglobin 11.6 (*)    RDW 18.6 (*)    Lymphs Abs 0.2 (*)    All other components within normal limits  COMPREHENSIVE METABOLIC PANEL - Abnormal; Notable for the following components:   Potassium 3.3 (*)    BUN 24 (*)    Creatinine, Ser 1.65 (*)    GFR, Estimated 33 (*)    All other components within normal limits    EKG EKG Interpretation Date/Time:  Thursday June 19 2023 04:22:35 EDT Ventricular Rate:  85 PR Interval:  160 QRS Duration:  79 QT Interval:  369 QTC Calculation: 439 R Axis:   -3  Text Interpretation: Sinus rhythm Probable left atrial enlargement Left ventricular hypertrophy Inferior infarct, old No significant change since last tracing Confirmed by Melene Plan [phone]) on 06/19/2023 4:27:17 AM  Radiology No results found.  Procedures Procedures    Medications Ordered in ED Medications  sodium chloride 0.9 % bolus 1,000 mL (1,000 mLs Intravenous New Bag/Given 06/19/23 0444)  naloxone [facility]) injection 0.4 mg (0.4 mg Intravenous Given 06/19/23 0443)    ED Course/ Medical Decision Making/  A&P                                 Medical Decision Making Amount and/or Complexity of Data Reviewed Labs: ordered.  Risk Prescription drug management.   72 yo F with no significant past medical history of breast cancer been diagnosed with anal cancer, then diagnosed with T-cell lymphoma.  She is just finished palliative radiation therapy of the throat.  Has had some esophagitis with this.  Patient tells me her daughter made her come because she was having trouble waking her up.  I will  try and obtain further history from the daughter as the patient is not quite sure otherwise why she is here.  Per EMS report it sounds like she got her first dose of morphine this evening.  Will give a bolus of IV fluids.  Blood work.  EKG.  Reassess.  Patient's respiratory rate was less than 8.  She was given a small dose of Narcan and unfortunately went into acute withdrawal.  She is not feeling well on repeat assessments.  Awaiting blood work.  Giving IV fluids.  Patient now feeling a bit better.  Potassium mildly low at 3.3.  Renal function mildly worse at 1.6 compared to baseline of about 1.2.  No acute anemia.  Will discharge patient home.  Oncology follow-up.  5:33 AM:  I have discussed the diagnosis/risks/treatment options with the patient.  Evaluation and diagnostic testing in the emergency department does not suggest an emergent condition requiring admission or immediate intervention beyond what has been performed at this time.  They will follow up with PCP. We also discussed returning to the ED immediately if new or worsening sx occur. We discussed the sx which are most concerning (e.g., sudden worsening pain, fever, inability to tolerate by mouth) that necessitate immediate return. Medications administered to the patient during their visit and any new prescriptions provided to the patient are listed below.  Medications given during this visit Medications  sodium chloride 0.9 % bolus 1,000 mL  (1,000 mLs Intravenous New Bag/Given 06/19/23 0444)  naloxone [facility]) injection 0.4 mg (0.4 mg Intravenous Given 06/19/23 0443)     The patient appears reasonably screen and/or stabilized for discharge and I doubt any other medical condition or other [facility] requiring further screening, evaluation, or treatment in the ED at this time prior to discharge.          Final Clinical Impression(s) / ED Diagnoses Final diagnoses:  Opiate overdose, accidental or unintentional, initial encounter [facility])    Rx / DC Orders ED Discharge Orders          Ordered    naloxone [facility]) nasal spray 4 mg/0.1 mL        06/19/23 0532              Melene Plan, DO 06/19/23 1610

## 2023-06-19 NOTE — ED Notes (Signed)
RN called daughter to inform her of pt DC, daughter states she will be on the way in appx 20 mins

## 2023-06-22 ENCOUNTER — Encounter: Payer: Self-pay | Admitting: Hematology

## 2023-06-23 ENCOUNTER — Other Ambulatory Visit: Payer: Self-pay

## 2023-06-23 DIAGNOSIS — C8448 Peripheral T-cell lymphoma, not classified, lymph nodes of multiple sites: Secondary | ICD-10-CM

## 2023-06-27 ENCOUNTER — Other Ambulatory Visit: Payer: Self-pay | Admitting: Hematology

## 2023-06-27 ENCOUNTER — Inpatient Hospital Stay: Payer: PPO | Attending: Hematology

## 2023-06-27 ENCOUNTER — Encounter: Payer: Self-pay | Admitting: Hematology

## 2023-06-27 ENCOUNTER — Other Ambulatory Visit (HOSPITAL_COMMUNITY): Payer: Self-pay

## 2023-06-27 ENCOUNTER — Inpatient Hospital Stay: Payer: PPO

## 2023-06-27 ENCOUNTER — Inpatient Hospital Stay (HOSPITAL_BASED_OUTPATIENT_CLINIC_OR_DEPARTMENT_OTHER): Payer: PPO | Admitting: Hematology

## 2023-06-27 VITALS — BP 128/94 | HR 71 | Temp 97.5°F | Resp 18 | Wt 179.6 lb

## 2023-06-27 DIAGNOSIS — C8448 Peripheral T-cell lymphoma, not classified, lymph nodes of multiple sites: Secondary | ICD-10-CM | POA: Diagnosis not present

## 2023-06-27 DIAGNOSIS — E860 Dehydration: Secondary | ICD-10-CM | POA: Diagnosis not present

## 2023-06-27 DIAGNOSIS — Z170 Estrogen receptor positive status [ER+]: Secondary | ICD-10-CM

## 2023-06-27 DIAGNOSIS — E876 Hypokalemia: Secondary | ICD-10-CM

## 2023-06-27 DIAGNOSIS — Z79899 Other long term (current) drug therapy: Secondary | ICD-10-CM | POA: Insufficient documentation

## 2023-06-27 DIAGNOSIS — C50411 Malignant neoplasm of upper-outer quadrant of right female breast: Secondary | ICD-10-CM

## 2023-06-27 DIAGNOSIS — Z95828 Presence of other vascular implants and grafts: Secondary | ICD-10-CM

## 2023-06-27 LAB — CBC WITH DIFFERENTIAL (CANCER CENTER ONLY)
Abs Immature Granulocytes: 0.02 10*3/uL (ref 0.00–0.07)
Basophils Absolute: 0 10*3/uL (ref 0.0–0.1)
Basophils Relative: 1 %
Eosinophils Absolute: 0.1 10*3/uL (ref 0.0–0.5)
Eosinophils Relative: 1 %
HCT: 36.4 % (ref 36.0–46.0)
Hemoglobin: 12 g/dL (ref 12.0–15.0)
Immature Granulocytes: 1 %
Lymphocytes Relative: 7 %
Lymphs Abs: 0.3 10*3/uL — ABNORMAL LOW (ref 0.7–4.0)
MCH: 27.3 pg (ref 26.0–34.0)
MCHC: 33 g/dL (ref 30.0–36.0)
MCV: 82.9 fL (ref 80.0–100.0)
Monocytes Absolute: 0.5 10*3/uL (ref 0.1–1.0)
Monocytes Relative: 14 %
Neutro Abs: 3 10*3/uL (ref 1.7–7.7)
Neutrophils Relative %: 76 %
Platelet Count: 185 10*3/uL (ref 150–400)
RBC: 4.39 MIL/uL (ref 3.87–5.11)
RDW: 18 % — ABNORMAL HIGH (ref 11.5–15.5)
WBC Count: 3.9 10*3/uL — ABNORMAL LOW (ref 4.0–10.5)
nRBC: 0 % (ref 0.0–0.2)

## 2023-06-27 LAB — CMP (CANCER CENTER ONLY)
ALT: 10 U/L (ref 0–44)
AST: 18 U/L (ref 15–41)
Albumin: 3.8 g/dL (ref 3.5–5.0)
Alkaline Phosphatase: 73 U/L (ref 38–126)
Anion gap: 9 (ref 5–15)
BUN: 27 mg/dL — ABNORMAL HIGH (ref 8–23)
CO2: 27 mmol/L (ref 22–32)
Calcium: 9.7 mg/dL (ref 8.9–10.3)
Chloride: 102 mmol/L (ref 98–111)
Creatinine: 1.68 mg/dL — ABNORMAL HIGH (ref 0.44–1.00)
GFR, Estimated: 32 mL/min — ABNORMAL LOW (ref >60)
Glucose, Bld: 90 mg/dL (ref 70–99)
Potassium: 2.9 mmol/L — ABNORMAL LOW (ref 3.5–5.1)
Sodium: 138 mmol/L (ref 135–145)
Total Bilirubin: 0.5 mg/dL (ref 0.3–1.2)
Total Protein: 6.9 g/dL (ref 6.5–8.1)

## 2023-06-27 MED ORDER — SODIUM CHLORIDE 0.9 % IV SOLN: INTRAVENOUS | Status: DC

## 2023-06-27 MED ORDER — MAGIC MOUTHWASH W/LIDOCAINE: 5.0000 mL | Freq: Three times a day (TID) | ORAL | 1 refills | Status: DC | PRN

## 2023-06-27 MED ORDER — FLUCONAZOLE 200 MG PO TABS
200.0000 mg | ORAL_TABLET | Freq: Every day | ORAL | 0 refills | Status: DC
Start: 2023-06-27 — End: 2023-06-27

## 2023-06-27 MED ORDER — SUCRALFATE 1 G PO TABS: 1.0000 g | ORAL_TABLET | Freq: Two times a day (BID) | ORAL | 0 refills | Status: DC

## 2023-06-27 MED ORDER — FLUCONAZOLE 200 MG PO TABS: 200.0000 mg | ORAL_TABLET | Freq: Every day | ORAL | 0 refills | Status: DC

## 2023-06-27 MED ORDER — SODIUM CHLORIDE 0.9% FLUSH
10.0000 mL | Freq: Once | INTRAVENOUS | Status: AC
  Administered 2023-06-27: 10 mL

## 2023-06-27 MED ORDER — SUCRALFATE 1 G PO TABS
1.0000 g | ORAL_TABLET | Freq: Two times a day (BID) | ORAL | 0 refills | Status: AC
  Filled 2023-06-27: qty 60, 30d supply, fill #0

## 2023-06-27 MED ORDER — HEPARIN SOD (PORK) LOCK FLUSH 100 UNIT/ML IV SOLN
500.0000 [IU] | Freq: Once | INTRAVENOUS | Status: AC
  Administered 2023-06-27: 500 [IU]

## 2023-06-27 MED ORDER — POTASSIUM CHLORIDE CRYS ER 20 MEQ PO TBCR
40.0000 meq | EXTENDED_RELEASE_TABLET | Freq: Once | ORAL | Status: AC
  Administered 2023-06-27: 40 meq via ORAL
  Filled 2023-06-27: qty 2

## 2023-06-27 MED ORDER — POTASSIUM CHLORIDE CRYS ER 20 MEQ PO TBCR: 20.0000 meq | EXTENDED_RELEASE_TABLET | Freq: Two times a day (BID) | ORAL | 0 refills | Status: DC

## 2023-06-27 MED ORDER — FLUCONAZOLE 200 MG PO TABS
200.0000 mg | ORAL_TABLET | Freq: Every day | ORAL | 0 refills | Status: DC
  Filled 2023-06-27: qty 14, 14d supply, fill #0

## 2023-06-27 MED ORDER — STERILE WATER FOR INJECTION IJ SOLN
5.0000 mL | Freq: Three times a day (TID) | OROMUCOSAL | 1 refills | Status: AC | PRN
  Filled 2023-06-27: qty 400, 27d supply, fill #0

## 2023-06-27 MED ORDER — POTASSIUM CHLORIDE IN NACL 20-0.9 MEQ/L-% IV SOLN
INTRAVENOUS | Status: AC
  Filled 2023-06-27: qty 1000

## 2023-06-27 NOTE — Progress Notes (Signed)
Cynthia [facility]   [phone] [phone]    HEMATOLOGY ONCOLOGY CLINIC VISIT   Patient Care Team: [name], MD as PCP - General (Family Medicine) [name], MD as PCP - Hematology/Oncology (Hematology) [name], MD as Consulting Physician (Gastroenterology) [name], MD as Consulting Physician (Hematology) [name], NP as Nurse Practitioner (Nurse Practitioner) [name], MD as Consulting Physician (Radiation Oncology) [name], MD as Consulting Physician (General Surgery) [name], MD as Attending Physician (Otolaryngology)  Date of Service: 06/27/23    CHIEF COMPLAINT: f/u for evaluation and management of T cell lymphoma.  SUMMARY OF ONCOLOGIC HISTORY: Oncology History Overview Note  Cancer Staging Anal cancer (HCC) Staging form: Anus, AJCC 8th Edition - Clinical stage from 06/28/2019: Stage IIA (cT2, cN0, cM0) - Signed by [name], NP on 06/28/2019  Breast cancer of upper-outer quadrant of left female breast [facility]) Staging form: Breast, AJCC 7th Edition - Clinical stage from 09/07/2013: Stage IIA (T2, N0, cM0) - Signed by [name], MD on 09/07/2013 - Pathologic: Stage IIA (T2, N0, cM0) - Signed by [name], MD on 09/07/2013  Breast cancer of upper-outer quadrant of right female breast [facility]) Staging form: Breast, AJCC 7th Edition - Clinical stage from 09/07/2013: Stage IA (T1a, N0, cM0) - Signed by [name], MD on 09/07/2013 - Pathologic: Stage IA (T1a, N0, cM0) - Signed by [name], MD on 09/07/2013    Breast cancer of upper-outer quadrant of left female breast (HCC)  07/10/2007 Procedure   US biopsy showed invasive ductal carcinoma 0.8cm, Nottingham grade 3   07/30/2007 Surgery   She had left breast lumpectomy and LN biopsy which showed high grade invasive ductal cancer Nottingham grade 3, 2.7 cm, ER/PR/Her2 neu negative   11/24/2007 Surgery   Patient elected for bilateral  mastectomy   09/07/2008 - 03/08/2009 Chemotherapy   dates are approximate: she received adriamycin and cytoxan followed by Taxol   03/08/2009 - 05/08/2009 Radiation Therapy   dates are approximate, she received XRT    07/09/2021 Imaging   CT CAP  IMPRESSION: 1. No evidence to suggest locally recurrent ianorectal neoplasm or metastatic disease in the chest, abdomen or pelvis. 2. Status post bilateral modified radical mastectomy and bilateral axillary lymph node dissection. 3. Three small supraumbilical ventral hernias containing only omental fat. No associated bowel incarceration or obstruction at this time. 4. Aortic atherosclerosis. 5. Additional incidental findings, as above.   Breast cancer of upper-outer quadrant of right female breast (HCC)  07/10/2006 Procedure   stereotactic biopsy showed atypical hyperplasia   10/23/2006 Surgery   right lumpectomy showed invasive ductal ca (Nottingham grade 1)  2mm and DCIS, ER/PR positive Her 2 negative   10/09/2007 - 10/08/2012 Chemotherapy   Patient was placed on Tamoxifen   03/24/2017 Imaging   No acute findings. No evidence of recurrent carcinoma or metastatic disease   07/09/2021 Imaging   CT CAP  IMPRESSION: 1. No evidence to suggest locally recurrent ianorectal neoplasm or metastatic disease in the chest, abdomen or pelvis. 2. Status post bilateral modified radical mastectomy and bilateral axillary lymph node dissection. 3. Three small supraumbilical ventral hernias containing only omental fat. No associated bowel incarceration or obstruction at this time. 4. Aortic atherosclerosis. 5. Additional incidental findings, as above.   Anal cancer (HCC)  06/04/2019 Procedure   Colonoscopy per Dr. [name]: Findings-the digital rectal exam revealed a 3 cm diameter firm rectal mass.  The mass was noncircumferential and located predominantly at  the right bowel wall at the anorectal junction. A nonobstructing mass was found at the  anus and in the rectum    06/04/2019 Initial Biopsy   Follow pathology: Large intestine, rectum biopsy: Invasive well to moderately differentiated squamous cell carcinoma.  No rectal mucosa present.  There is strong diffuse expression of P 16 immunostain.  CDX 2, p63 and mCEA immunostains are also used in the diagnostic work-up of the case.   06/04/2019 Initial Diagnosis   Anal cancer (HCC)   06/21/2019 PET scan   IMPRESSION: 1. Anorectal primary. No hypermetabolic metastatic disease within the chest, abdomen, or pelvis. Perirectal nodes, at least 1 of which is new since 02/26/2018 CT, suspicious based on size and interval development. 2. Mild limitations secondary to beam hardening artifact from bilateral hip arthroplasty. 3.  Aortic Atherosclerosis (ICD10-I70.0).   06/28/2019 Cancer Staging   Staging form: Anus, AJCC 8th Edition - Clinical stage from 06/28/2019: Stage IIA (cT2, cN0, cM0) - Signed by [name], NP on 06/28/2019   06/28/2019 - 07/26/2019 Chemotherapy   concurrent chemoRT with Mitomycin and 5FU on week 1 and week 5 on starting 06/28/19. Last dose on 07/26/19   06/28/2019 - 08/09/2019 Radiation Therapy   concurrent chemoRT with Dr Mitzi Hansen 06/28/19-08/09/19   11/01/2019 PET scan   IMPRESSION: 1. Marked interval decrease in hypermetabolism noted at the level of the anal rectal primary. No evidence for hypermetabolic metastatic disease in the chest, abdomen, or pelvis. The perirectal lymph nodes identified previously have resolved in the interval. 2.  Aortic Atherosclerois (ICD10-170.0)   08/07/2020 Imaging   CT CAP  IMPRESSION: 1. No findings for residual/recurrent anal tumor, regional lymphadenopathy or metastatic disease. 2. Status post cholecystectomy. No biliary dilatation. 3. Small anterior abdominal wall hernia containing fat. 4. Aortic atherosclerosis.   Aortic Atherosclerosis (ICD10-I70.0).     07/09/2021 Imaging   CT CAP  IMPRESSION: 1. No  evidence to suggest locally recurrent ianorectal neoplasm or metastatic disease in the chest, abdomen or pelvis. 2. Status post bilateral modified radical mastectomy and bilateral axillary lymph node dissection. 3. Three small supraumbilical ventral hernias containing only omental fat. No associated bowel incarceration or obstruction at this time. 4. Aortic atherosclerosis. 5. Additional incidental findings, as above.   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 Mosetta Putt for evaluation and management of newly diagnosed T-cell non-Hodgkin's lymphoma.  Patient has a history of bilateral breast cancer status post mastectomies.  -2007: s/p right lumpectomy for ADH in 08/2006 showed DCIS ER/PR+, grade 1. 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 She was adopted, no known family history    She subsequently was diagnosed with anal squamous cell carcinoma cT2 N0 M0 diagnosed in September 2020.Workup showed a 3 cm mass at the anorectal junction, biopsy confirmed well to moderately differentiated squamous cell carcinoma.  06/21/19 PET scan showed no metastasis.  -S/p concurrent chemoRT with Mitomycin and 5FU on 08/09/19. Now on surveillance. -surveillance CT CAP 07/09/21 was NED. -surveillance colonoscopy done for rectal bleeding on 12/07/21  showed only radiation changes. Her rectal bleeding has resolved.  Recently patient presented to her primary care physician on 05/23/2022 with her new right neck lump which has been progressively enlarging.  She also subsequently developed a left neck swelling.  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  She has had a PET CT scan done on 06/19/2022 which showed hypermetabolic bilateral neck lymphadenopathy.  Also noted to have a new 1 cm focus of metabolic activity in segment 4 of the liver.  This is etiology indeterminate. Lumbar scoliosis and small supraumbilical hernia.  Patient has had Port-A-Cath placement done on 06/25/2022. Echocardiogram was done on 06/17/2022 and shows normal ejection fraction of 70 to 75% with grade 1 diastolic dysfunction.  Right ventricular systolic function within normal limits.  INTERVAL HISTORY  Cynthia Velasquez is a 72 y/o female here for evaluation and management of T-cell non-Hodgkin's lymphoma. Patient was last seen by me on 05/16/2023 and complained of diarrhea, vomit, bilateral neck swelling with mild pain, swallowing issues, low appetite, lower back pain, low energy, skin rashes in bilateral axillaries with persistent itching.  She presented to the ED on 06/19/2023 with altered mental status related to her opiates.  Patient's last radiation therapy was two weeks ago. She reports that her neck swelling/lumps have improved, but her throat pain has not. She complains of swallowing pain limiting her ability to eat. She complains that she cannot taste food. She reports that she is not eating at all. Patient does have Boost/Ensure at home but reports that she cannot swallow it due to pain. She reports that she is trying to stay hydrated, but is limited by her swallowing pain. She reports that when her mouth is closed, she feels like the roof of her mouth and  tongue are stuck together.   She complains of white spots on her tongue with some bleeding. Patient does appear to endorse thrush on examination. She does use Nystatin mouth wash regularly.   She reports having bumps in her bilateral hands, which is a new symptom. Patient notes that she does pick at them regularly.   She also complains of a headache x3 days.She reports balance issues and continues to use a cane regularly. Patient reports that her diarrhea has generally improved, but she did have a bothersome episode recently.  She does complain of chills due to feeling cold. Patient denies any rigors. Patient complains of abdominal tenderness on palpation. She denies any fever, infection, or back pain.   Patient reports that Morphine caused delirium and caused her to present to the hospital.  ROS  10 Point review of Systems was done is negative except as noted above.   MEDICAL HISTORY:  Past Medical History:  Diagnosis Date   Anal cancer (HCC) dx'd 05/2019   Anxiety    Arthritis    Bilateral breast cancer (HCC) 10/21/2013   Breast cancer (HCC)    Cancer (HCC)    Depression    Gallstones    GERD (gastroesophageal reflux disease)    doesn't take any meds for this   Headache    h/o migraines       History of bladder infections    History of hiatal hernia  History of migraine    Hyperlipidemia    takes Simvastatin daily   Hypertension    takes Hyzaar   Joint pain    Joint swelling    Lumbar stenosis    Neuropathy 09/07/2013   Pneumonia    Pre-diabetes     SURGICAL HISTORY: Past Surgical History:  Procedure Laterality Date   ABDOMINAL HYSTERECTOMY     bone spur removed from left foot     CHOLECYSTECTOMY     COLONOSCOPY     COLONOSCOPY WITH PROPOFOL Bilateral 12/07/2021   Procedure: COLONOSCOPY WITH PROPOFOL;  Surgeon: Vida Rigger, MD;  Location: [facility];  Service: Endoscopy;  Laterality: Bilateral;   double mastectomy      ESOPHAGOGASTRODUODENOSCOPY N/A 12/07/2021    Procedure: ESOPHAGOGASTRODUODENOSCOPY (EGD);  Surgeon: Vida Rigger, MD;  Location: [facility];  Service: Endoscopy;  Laterality: N/A;   HERNIA REPAIR     umbilical   IR IMAGING GUIDED PORT INSERTION  06/25/2022   right knee arthroscopy     right shoulder arthroscopy     surgery for hiatal hernia     TONSILLECTOMY     TOTAL HIP ARTHROPLASTY Left 02/12/2013   TOTAL HIP ARTHROPLASTY Left 02/12/2013   Procedure: LEFT TOTAL HIP ARTHROPLASTY;  Surgeon: Nestor Lewandowsky, MD;  Location: [facility] OR;  Service: Orthopedics;  Laterality: Left;   TOTAL HIP ARTHROPLASTY Right 05/03/2013   Procedure: TOTAL HIP ARTHROPLASTY;  Surgeon: Nestor Lewandowsky, MD;  Location: [facility] OR;  Service: Orthopedics;  Laterality: Right;   TOTAL KNEE ARTHROPLASTY Right 02/27/2015   Procedure: TOTAL KNEE ARTHROPLASTY;  Surgeon: Gean Birchwood, MD;  Location: [facility] OR;  Service: Orthopedics;  Laterality: Right;   TOTAL SHOULDER ARTHROPLASTY Right 09/12/2016   Procedure: RIGHT TOTAL SHOULDER ARTHROPLASTY;  Surgeon: Jones Broom, MD;  Location: [facility] OR;  Service: Orthopedics;  Laterality: Right;  RIGHT TOTAL SHOULDER ARTHROPLASTY    I have reviewed the social history and family history with the patient and they are unchanged from previous note.  ALLERGIES:  is allergic to tramadol, belinostat, codeine, and vicodin [hydrocodone-acetaminophen].  MEDICATIONS:  Current Outpatient Medications  Medication Sig Dispense Refill   magic mouthwash w/lidocaine SOLN Take 5 mLs by mouth 3 (three) times daily as needed for mouth pain. Suspension contains total volume 80 ml of distilled water 80ml of maalox 80ml of 2% viscous lidocaine 80ml of nystatin at 500000 units per 5 ml 80ml of diphenhydramine at 12.5mg /62ml 400 mL 1   potassium chloride SA (KLOR-CON M) 20 MEQ tablet Take 1 tablet (20 mEq total) by mouth 2 (two) times daily. 30 tablet 0   aspirin-acetaminophen-caffeine (EXCEDRIN MIGRAINE) 250-250-65 MG tablet Take 2 tablets by mouth every 6  (six) hours as needed for migraine.     DULoxetine (CYMBALTA) 60 MG capsule Take 1 capsule (60 mg total) by mouth 2 (two) times daily. Take 1 capsule (30 mg) at bedtime for 1 week, if no side effects, then increase to 2 capsules (60 mg) thereafter. 60 capsule 11   fluconazole (DIFLUCAN) 200 MG tablet Take 1 tablet (200 mg total) by mouth daily. 14 tablet 0   losartan (COZAAR) 100 MG tablet Take 100 mg by mouth daily.  6   Menthol-Methyl Salicylate (SALONPAS PAIN RELIEF PATCH EX) Apply 1 patch topically daily as needed (pain).     methocarbamol (ROBAXIN) 750 MG tablet Take 1 tablet (750 mg total) by mouth every 8 (eight) hours as needed for muscle spasms. 20 tablet 0   naloxone (NARCAN) nasal spray 4  mg/0.1 mL 1 squirt in the naris for unresponsiveness 2 each 3   Nystatin (GERHARDT'S BUTT CREAM) CREA Apply 1 Application topically 2 (two) times daily. (Patient taking differently: Apply 1 Application topically 2 (two) times daily as needed for irritation.) 30 each 0   nystatin (MYCOSTATIN/NYSTOP) powder Apply 1 Application topically 3 (three) times daily. (Patient taking differently: Apply 1 Application topically 3 (three) times daily as needed (redness, rash).) 15 g 0   ondansetron (ZOFRAN) 8 MG tablet Take 1 tablet (8 mg total) by mouth every 8 (eight) hours as needed for nausea or vomiting. 30 tablet 1   pantoprazole (PROTONIX) 40 MG tablet Take 1 tablet (40 mg total) by mouth daily. (Patient taking differently: Take 40 mg by mouth daily as needed (for acid reflux).) 30 tablet 0   prochlorperazine (COMPAZINE) 10 MG tablet Take 1 tablet (10 mg total) by mouth every 6 (six) hours as needed for nausea or vomiting. 30 tablet 1   sucralfate (CARAFATE) 1 g tablet Take 1 tablet (1 g total) by mouth 2 (two) times daily. Dissolve tab in 5-10 ml of water and then swallow the slurry. 60 tablet 0   triamcinolone ointment (KENALOG) 0.5 % Apply 1 Application topically 2 (two) times daily. To rash under both armpits  30 g 0   No current facility-administered medications for this visit.   Facility-Administered Medications Ordered in Other Visits  Medication Dose Route Frequency Provider Last Rate Last Admin   0.9 %  sodium chloride infusion   Intravenous Continuous Sunshine Mackowski, Corene Cornea, MD        PHYSICAL EXAMINATION: .BP (!) 128/94   Pulse 71   Temp (!) 97.5 F (36.4 C)   Resp 18   Wt 179 lb 9.6 oz (81.5 kg)   SpO2 98%   BMI 29.89 kg/m    GENERAL:alert, in no acute distress and comfortable SKIN: no acute rashes, no significant lesions EYES: conjunctiva are pink and non-injected, sclera anicteric OROPHARYNX: MMM, no exudates, no oropharyngeal erythema or ulceration NECK: supple, no JVD LYMPH:  no palpable lymphadenopathy in the cervical, axillary or inguinal regions LUNGS: clear to auscultation b/l with normal respiratory effort HEART: regular rate & rhythm ABDOMEN:  normoactive bowel sounds , non tender, not distended. Extremity: no pedal edema PSYCH: alert & oriented x 3 with fluent speech NEURO: no focal motor/sensory deficits   LABORATORY DATA:  I have reviewed the data as listed.    Latest Ref Rng & Units 06/27/2023   12:40 PM 06/19/2023    4:27 AM 04/21/2023   12:59 PM  CBC  WBC 4.0 - 10.5 K/uL 3.9  3.8  3.1   Hemoglobin 12.0 - 15.0 g/dL 34.7  42.5  95.6   Hematocrit 36.0 - 46.0 % 36.4  37.7  32.3   Platelets 150 - 400 K/uL 185  193  224    .    Latest Ref Rng & Units 06/27/2023   12:40 PM 06/19/2023    4:27 AM 04/21/2023   12:59 PM  CMP  Glucose 70 - 99 mg/dL 90  93  91   BUN 8 - 23 mg/dL 27  24  18    Creatinine 0.44 - 1.00 mg/dL 3.87  5.64  3.32   Sodium 135 - 145 mmol/L 138  143  140   Potassium 3.5 - 5.1 mmol/L 2.9  3.3  3.5   Chloride 98 - 111 mmol/L 102  105  109   CO2 22 - 32 mmol/L 27  23  25   Calcium 8.9 - 10.3 mg/dL 9.7  9.7  9.2   Total Protein 6.5 - 8.1 g/dL 6.9  7.4  7.0   Total Bilirubin 0.3 - 1.2 mg/dL 0.5  0.8  0.4   Alkaline Phos 38 - 126 U/L 73  71   95   AST 15 - 41 U/L 18  18  16    ALT 0 - 44 U/L 10  14  9     . Lab Results  Component Value Date   LDH 217 (H) 03/07/2023      01/21/2023 Needle Core Biopsy:           RADIOGRAPHIC STUDIES: .No results found.   ASSESSMENT & PLAN:   Cynthia Velasquez is a 72 y.o.  female with   1. Relapsed 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 Plan -Results of excisional lymph node biopsy showing peripheral T-cell lymphoma not otherwise specified which is CD30 negative were discussed in detail with the patient. PET CT scan was previously reviewed and she appears to have at least stage II disease but depending on the liver lesion etiology this could represent stage IV disease. She has had anthracycline exposure as a part of her AC regimen for previous treatment of breast cancer and is therefore limited in her total lifetime anthracycline use. CD 30 negative status precludes use of Adcentris.  2. Anal Squamous Cell Carcinoma, cT2N0M0-currently in remission.  Details as noted above   3. 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 Completed tamoxifen 09/2007 - 09/2012 -2008: stage pT2N0 left breast invasive ductal carcinoma, triple negative, grade 3; s/p lumpectomy in 07/2007 and bilateral mastectomy 11/2007 (path negative for residual carcinoma in both breasts); s/p adjuvant chemo AC-T and radiation  4. H/o Chronic diarrhea -- follows with GI  5. Hx of neuropathy from previous chemotherapy  6. Extensive previous exposure to chemotherapy.  PLAN:  -Discussed lab results on 06/27/23 in detail with patient. CBC showed WBC of 3.9K, hemoglobin of 12.0, and platelets of 185K. -potassium is low at 2.9 mmol/L, CMP also shows signs of dehydration -discussed option to receive IV fluids, which patient is agreeable with -discussed option of receiving IV potassium; unsure how well she can tolerate oral  potassium -her headache is likely related to dehydration -answered patient's questions regarding her last PET scan -informed patient that there may be some soreness from radiation, which should hopefully improve after 3-6 weeks. Ginette Pitman may also be adding to her swallowing pain. -educated patient that certain narcotics may cause confusion in those with increased age -recommend taking vitamin B complex supplement, which is available in liquid form to help improve mouth soreness quicker -will connect patient to a nutritional therapist to optimize her nutrition -recommend patient to take at least four boost/ensures a day for nutritional support -continue to drink as much liquids as possible to improve hydration status -will order Sucralfate  -will order oral antifungal, Fluconazole  -will order magic mouth wash -will refill lidocaine -discussed priority of controlling oral symptoms to help her start eating and optimizing her nutrition.  -discussed next priority of being seen at [facility] [place] for consideration of clinical trials -discussed potential treatment options of Bendamustine to use as palliative treatment, or Duvelisib  -will plan for PET scan in about 6 weeks  FOLLOW UP: RTC with Dr Candise Che with portflush labs and MD visit in 3 weeks Pet/ct in 6 weeks The total time  spent in the appointment was 40 minutes* .  All of the patient's questions were answered with apparent satisfaction. The patient knows to call the clinic with any problems, questions or concerns.   Wyvonnia Lora MD MS AAHIVMS [employer] [facility] Hematology/Oncology Physician [facility]  .*Total Encounter Time as defined by the Centers for Medicare and Medicaid Services includes, in addition to the face-to-face time of a patient visit (documented in the note above) non-face-to-face time: obtaining and reviewing outside history, ordering and reviewing medications, tests or procedures, care coordination (communications  with other health care professionals or caregivers) and documentation in the medical record.    I,Mitra Faeizi,acting as a Neurosurgeon for Wyvonnia Lora, MD.,have documented all relevant documentation on the behalf of Wyvonnia Lora, MD,as directed by  Wyvonnia Lora, MD while in the presence of Wyvonnia Lora, MD.  .I have reviewed the above documentation for accuracy and completeness, and I agree with the above. [name] MD

## 2023-06-30 ENCOUNTER — Ambulatory Visit: Payer: PPO | Attending: Radiation Oncology | Admitting: Physical Therapy

## 2023-06-30 ENCOUNTER — Encounter: Payer: Self-pay | Admitting: Physical Therapy

## 2023-06-30 DIAGNOSIS — I890 Lymphedema, not elsewhere classified: Secondary | ICD-10-CM | POA: Diagnosis not present

## 2023-06-30 DIAGNOSIS — C8448 Peripheral T-cell lymphoma, not classified, lymph nodes of multiple sites: Secondary | ICD-10-CM | POA: Diagnosis not present

## 2023-06-30 DIAGNOSIS — R262 Difficulty in walking, not elsewhere classified: Secondary | ICD-10-CM | POA: Diagnosis not present

## 2023-06-30 DIAGNOSIS — M6281 Muscle weakness (generalized): Secondary | ICD-10-CM | POA: Insufficient documentation

## 2023-06-30 DIAGNOSIS — L599 Disorder of the skin and subcutaneous tissue related to radiation, unspecified: Secondary | ICD-10-CM | POA: Diagnosis not present

## 2023-06-30 NOTE — Therapy (Signed)
OUTPATIENT PHYSICAL THERAPY HEAD AND NECK BASELINE TREATMENT   Patient Name: Mkya Vogan MRN: [id number] DOB:[birth year], 72 y.o., female Today's Date: 06/30/2023  END OF SESSION:  PT End of Session - 06/30/23 1103     Visit Number 2    Number of Visits 9    Date for PT Re-Evaluation 07/10/23    PT Start Time 1102    PT Stop Time 1145    PT Time Calculation (min) 43 min    Activity Tolerance Patient tolerated treatment well    Behavior During Therapy [facility] for tasks assessed/performed             Past Medical History:  Diagnosis Date   Anal cancer (HCC) dx'd 05/2019   Anxiety    Arthritis    Bilateral breast cancer (HCC) 10/21/2013   Breast cancer (HCC)    Cancer (HCC)    Depression    Gallstones    GERD (gastroesophageal reflux disease)    doesn't take any meds for this   Headache    h/o migraines       History of bladder infections    History of hiatal hernia    History of migraine    Hyperlipidemia    takes Simvastatin daily   Hypertension    takes Hyzaar   Joint pain    Joint swelling    Lumbar stenosis    Neuropathy 09/07/2013   Pneumonia    Pre-diabetes    Past Surgical History:  Procedure Laterality Date   ABDOMINAL HYSTERECTOMY     bone spur removed from left foot     CHOLECYSTECTOMY     COLONOSCOPY     COLONOSCOPY WITH PROPOFOL Bilateral 12/07/2021   Procedure: COLONOSCOPY WITH PROPOFOL;  Surgeon: Vida Rigger, MD;  Location: [facility];  Service: Endoscopy;  Laterality: Bilateral;   double mastectomy      ESOPHAGOGASTRODUODENOSCOPY N/A 12/07/2021   Procedure: ESOPHAGOGASTRODUODENOSCOPY (EGD);  Surgeon: Vida Rigger, MD;  Location: [facility];  Service: Endoscopy;  Laterality: N/A;   HERNIA REPAIR     umbilical   IR IMAGING GUIDED PORT INSERTION  06/25/2022   right knee arthroscopy     right shoulder arthroscopy     surgery for hiatal hernia     TONSILLECTOMY     TOTAL HIP ARTHROPLASTY Left 02/12/2013   TOTAL HIP ARTHROPLASTY Left  02/12/2013   Procedure: LEFT TOTAL HIP ARTHROPLASTY;  Surgeon: Nestor Lewandowsky, MD;  Location: [facility] OR;  Service: Orthopedics;  Laterality: Left;   TOTAL HIP ARTHROPLASTY Right 05/03/2013   Procedure: TOTAL HIP ARTHROPLASTY;  Surgeon: Nestor Lewandowsky, MD;  Location: [facility] OR;  Service: Orthopedics;  Laterality: Right;   TOTAL KNEE ARTHROPLASTY Right 02/27/2015   Procedure: TOTAL KNEE ARTHROPLASTY;  Surgeon: Gean Birchwood, MD;  Location: [facility] OR;  Service: Orthopedics;  Laterality: Right;   TOTAL SHOULDER ARTHROPLASTY Right 09/12/2016   Procedure: RIGHT TOTAL SHOULDER ARTHROPLASTY;  Surgeon: Jones Broom, MD;  Location: [facility] OR;  Service: Orthopedics;  Laterality: Right;  RIGHT TOTAL SHOULDER ARTHROPLASTY   Patient Active Problem List   Diagnosis Date Noted   Syncope and collapse 12/29/2022   Hypomagnesemia 12/29/2022   Perianal pain 11/05/2022   Vaginal candidiasis 11/04/2022   Hypokalemia 10/28/2022   Pancytopenia (HCC) 10/28/2022   Port-A-Cath in place 10/01/2022   Chemotherapy-induced neuropathy (HCC) 09/03/2022   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   Incontinence of feces 09/28/2020   Essential  hypertension 09/27/2020   GERD without esophagitis 09/27/2020   History of bilateral mastectomy 09/27/2020   Hyperglycemia 09/27/2020   Moderate recurrent major depression (HCC) 09/27/2020   Pure hypercholesterolemia 09/27/2020   Anal cancer (HCC) 06/13/2019   Status post total shoulder arthroplasty, right 09/12/2016   Arthritis of knee 02/27/2015   Primary osteoarthritis of right knee Valgus 02/24/2015   Neuropathy 09/07/2013   Breast cancer of upper-outer quadrant of left female breast (HCC) 08/09/2013   Breast cancer of upper-outer quadrant of right female breast (HCC) 08/09/2013   Osteoarthritis of right hip 05/03/2013   Osteoarthritis of left hip 02/15/2013    PCP: Dr. 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 I had morphine and that sent me in to a tail spin. The trouble eating and swallowing has gotten worse.   PERTINENT HISTORY:  Bil breast cancer in 2008 with lumpectomy and SLNB, then bil mastectomy, Completed chemo and radiation. Nodes removed bil axilla - pt unsure of how many (surgery was in [place])  Then Anal cancer in 2020 with chemotherapy and radiation. Diagnosis of T cell lymphoma in 2023. on chemotherapy and just started palliative radiation to bilateral neck will complete on 06/16/23.   PATIENT GOALS:   to be educated about the signs and symptoms of lymphedema and learn post op HEP.   PAIN:  Are you having pain? Yes: NPRS scale: 6/10 Pain location: throat Pain description: burning Aggravating factors: eating, drinking, swallowing Relieving factors: nothing  PRECAUTIONS: Active CA and Comment (R shoulder replacement, R knee replacement, R THA, L THA)  RED FLAGS: Bowel or bladder incontinence: Yes: hx of anal cancer    WEIGHT BEARING RESTRICTIONS: No  FALLS:  Has patient fallen in last 6 months? No Does the patient have a fear of falling that limits activity? 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  LEISURE: walks 2-3x/wk for about 15 min  PRIOR LEVEL OF FUNCTION: Independent   OBJECTIVE:  COGNITION: Overall cognitive status: Within functional limits for tasks assessed                  POSTURE:  Forward head and rounded shoulders posture  30 SEC SIT TO STAND:  Unable to stand from a chair without use of UEs  SHOULDER AROM:    L WFL, R limited by about 60% and reports it has been this way since shoulder replacement   CERVICAL AROM:   Percent limited  Flexion 50% limited and feels swelling is limiting motion  Extension 25% limited  Right lateral flexion 25% limited  Left lateral flexion WFL  Right rotation 25% limited  Left rotation 25% limited    (Blank rows=not tested)  LYMPHEDEMA ASSESSMENT:    Circumference in cm  4 cm superior to sternal notch around neck 39.9  6 cm superior to sternal notch around neck 39  8 cm superior to sternal notch around neck 39.4  R lateral nostril from base of nose to medial tragus 11.9  L lateral nostril from base of nose to medial tragus 12.2  R corner of mouth to where ear lobe meets face 10.5  L corner of mouth to where ear lobe meets face 10        (Blank rows=not tested)  GAIT: Assessed: Yes Assistance needed: Modified Independent Ambulation Distance: 20 feet Assistive Device: none Gait pattern: partial step through, shortened step length, decreased foot clearance, decreased arm swing bilaterally Ambulation surface: Level  TREATMENT PERFORMED: 06/30/23: Instructed pt using Norton anterior approach handout as follows: short neck, 5 diaphragmatic breaths, bilateral axillary nodes, bilateral pectoral nodes, anterior chest, short neck, posterior neck moving fluid towards pathway aimed at lateral neck, then lateral and anterior neck moving fluid towards pathway aimed at lateral neck then retracing all steps. Had pt return demonstrate each step and provided v/c and t/c for  correct skin stretch tehcnique and sequence. Issued handout for pt to practice    06/12/23: Created foam chip pack in juzo cotton stockinette for pt to wear around head and neck for compression to decrease lymphedema  PATIENT EDUCATION:  Education details: Neck ROM, importance of posture when sitting, standing and lying down, deep breathing, walking program and importance of staying active throughout treatment, CURE article on staying active, "Why exercise?" flyer, lymphedema and PT info Person educated: Patient Education method: Explanation, Demonstration, Handout Education comprehension: Patient verbalized understanding and returned demonstration  HOME EXERCISE PROGRAM: Patient was instructed today in a home exercise program today for head and neck range of motion exercises. These included active cervical flexion, active cervical extension, active cervical rotation to each direction, upper trap stretch, and shoulder retraction. Patient was encouraged to do these 2-3 times a day, holding for 5 sec each and completing for 5 reps. Pt was educated that once this becomes easier then hold the stretches for 30-60 seconds.  Wear foam chip pack for at least 4 hours/day, do not tie too tightly  ASSESSMENT:  CLINICAL IMPRESSION: Began instructing pt in self MLD today and had her return demonstrate each step while therapist provided appropriate v/c and t/c. Pt demonstrated good skin stretch by end of session. Issued handout for pt to begin practicing at home. Her swelling was visibly reduced compared to last time.   Pt will benefit from skilled therapeutic intervention to improve on the following deficits: Decreased knowledge of precautions and postural dysfunction. Other deficits: decreased knowledge of condition, decreased knowledge of use of DME, increased edema, increased fascial restrictions, postural dysfunction, and pain  PT treatment/interventions: ADL/self-care home management, pt/family  education, therapeutic exercise. Other interventions Therapeutic exercises, Therapeutic activity, Patient/Family education, Self Care, Joint mobilization, Orthotic/Fit training, Manual lymph drainage, Compression bandaging, Taping, Vasopneumatic device, Manual therapy, and Re-evaluation  REHAB POTENTIAL: Good  CLINICAL DECISION MAKING: Stable/uncomplicated  EVALUATION COMPLEXITY: Low   GOALS: Goals reviewed with patient? YES  LONG TERM GOALS: (STG=LTG)   Name Target Date  Goal status  1 Patient will be able to verbalize understanding of a home exercise program for cervical range of motion, posture, and walking.   Baseline:  No knowledge 06/12/2023 Achieved at eval  2 Patient will be able to verbalize understanding of proper sitting and standing posture. Baseline:  No knowledge  06/12/2023 Achieved at eval  3 Patient will be able to verbalize understanding of lymphedema risk and availability of treatment for this condition Baseline:  No knowledge 06/12/2023 Achieved at eval  4 Pt will be independent in self MLD for long term management of lymphedema. Baseline: See objective measurements taken today. 07/10/23 New  5 Pt will obtain an appropriate compression garmetn for long term management of lymphedema. 07/10/23 NEW  6 Pt will demonstrate a 0.5 cm decrease in edema from R corner of mouth to where ear lobe meets face. 07/10/23 NEW    PLAN:  PT FREQUENCY/DURATION: 2x/wk for 4 wks  PLAN FOR NEXT SESSION: continue MLD to head and neck using norton anterior approach handout, show pt tribute night garment and educate, once lymphedema goals are met assess strength and add to POC  Patient will follow up at outpatient cancer rehab 2 weeks after completion of radiation.  If the patient requires physical therapy at that time, a specific plan will be dictated and sent to the referring physician for approval. The patient was educated today on appropriate basic range of motion exercises to begin now  and continue throughout radiation and educated on the signs and symptoms of lymphedema. Patient verbalized good understanding.     Physical Therapy Information for During and After Head/Neck Cancer Treatment: Lymphedema is a swelling condition that you may be at risk for in your neck and/or face if you have radiation treatment to the area and/or if you have surgery that includes removing lymph nodes.  There is treatment available for this condition and it is not life-threatening.  Contact your physician or physical therapist with concerns. An excellent resource for those seeking information on lymphedema is the National Lymphedema Network's website.  It can be accessed at www.lymphnet.org If you notice swelling in your neck or face at any time following surgery (even if it is many years from now), please contact your doctor or physical therapist to discuss this.  Lymphedema can be treated at any time but it is easier for you if it is treated early on. If you have had surgery to your neck, please check with your surgeon about how soon to start doing neck range of motion exercises.  If you are not having surgery, I encourage you to start doing neck range of motion exercises today and continue these while undergoing treatment, UNLESS you have irritation of your skin or soft tissue that is aggravated by doing them.  These exercises are intended to help you prevent loss of range of motion and/or to gain range of motion in your neck (which can be limited by tightening effects of radiation), and NOT to aggravate these tissues if they develop sensitivities from treatment. Neck range of motion exercises should be done to the point of feeling a GENTLE, TOLERABLE stretch only.  You are encouraged to start a walking or other exercise program tomorrow and continue this as much as you are able through and after treatment.  Please feel free to call me with any questions. Leonette Most, PT, CLT Physical  Therapist and Certified Lymphedema Therapist [facility] [address], [place], [place] [address] [phone] [email or web address]  WALKING  Walking is a great form of exercise to increase your strength, endurance and overall fitness.  A walking program can help you start slowly and gradually build endurance as you go.  Everyone's ability is different, so each person's starting point will be different.  You do not have to follow  them exactly.  The are just samples. You should simply find out what's right for you and stick to that program.   In the beginning, you'll start off walking 2-3 times a day for short distances.  As you get stronger, you'll be walking further at just 1-2 times per day.  A. You Can Walk For A Certain Length Of Time Each Day    Walk 5 minutes 3 times per day.  Increase 2 minutes every 2 days (3 times per day).  Work up to 25-30 minutes (1-2 times per day).   Example:   Day 1-2 5 minutes 3 times per day   Day 7-8 12 minutes 2-3 times per day   Day 13-14 25 minutes 1-2 times per day  B. You Can Walk For a Certain Distance Each Day     Distance can be substituted for time.    Example:   3 trips to mailbox (at road)   3 trips to corner of block   3 trips around the block  C. Go to local high school and use the track.    Walk for distance ____ around track  Or time ____ minutes  D. Walk ____ Jog ____ Run ___   Why exercise?  So many benefits! Here are SOME of them: Heart health, including raising your good cholesterol level and reducing heart rate and blood pressure Lung health, including improved lung capacity It burns fats, and most of [name] can stand to be leaner, whether or not we are overweight. It increases the body's natural painkillers and mood elevators, so makes you feel better. Not only makes you feel better, but look better too Improves sleep Takes a bite out of stress May decrease your risk of  many types of cancer If you are currently undergoing cancer treatment, exercise may improve your ability to tolerate treatments including chemotherapy. For everybody, it can improve your energy level. Those with cancer-related fatigue report a 40-50% reduction in this symptom when exercising regularly. If you are a survivor of breast, colon, or prostate cancer, it may decrease your risk of a recurrence. (This may hold for other cancers too, but so far we have data just for these three types.)  How to exercise: Get your doctor's okay. Pick something you enjoy doing, like walking, Zumba, biking, swimming, or whatever. Start at low intensity and time, then gradually increase.  (See walking program handout.) Set a goal to achieve over time.  The American Cancer Society, American Heart Association, and U.S. Dept. of Health and Human Services recommend 150 minutes of moderate exercise, 75 minutes of vigorous exercise, or a combination of both per week. This should be done in episodes at least 10 minutes long, spread throughout the week.  Need help being motivated? Pick something you enjoy doing, because you'll be more inclined to stick with that activity than something that feels like a chore. Do it with a friend so that you are accountable to each other. Schedule it into your day. Place it on your calendar and keep that appointment just like you do any appointment that you make. Join an exercise group that meets at a specific time.  That way, you have to show up on time, and that makes it harder to procrastinate about doing your workout.  It also keeps you accountable--people begin to expect you to be there. Join a gym where you feel comfortable and not intimidated, at the right cost. Sign up for something that you'll need to be  in shape for on a specific date, like a 1K or a 5K to walk or run, a 20 or 30 mile bike ride, a mud run or something like that. If the date is looming, you know you'll need to  train to be ready for it.  An added benefit is that many of these are fundraisers for good causes. If you've already paid for a gym membership, group exercise class or event, you might as well work out, so you haven't wasted your money!    [employer], PT 06/30/2023, 11:59 AM

## 2023-07-02 ENCOUNTER — Ambulatory Visit: Payer: PPO | Admitting: Physical Therapy

## 2023-07-02 ENCOUNTER — Encounter: Payer: Self-pay | Admitting: Physical Therapy

## 2023-07-02 DIAGNOSIS — C8448 Peripheral T-cell lymphoma, not classified, lymph nodes of multiple sites: Secondary | ICD-10-CM

## 2023-07-02 DIAGNOSIS — L599 Disorder of the skin and subcutaneous tissue related to radiation, unspecified: Secondary | ICD-10-CM

## 2023-07-02 DIAGNOSIS — I890 Lymphedema, not elsewhere classified: Secondary | ICD-10-CM | POA: Diagnosis not present

## 2023-07-02 NOTE — Therapy (Signed)
OUTPATIENT PHYSICAL THERAPY HEAD AND NECK BASELINE TREATMENT   Patient Name: Cynthia Velasquez MRN: [id number] DOB:Dec 13, 1950, 72 y.o., female Today's Date: 07/02/2023  END OF SESSION:  PT End of Session - 07/02/23 1208     Visit Number 3    Number of Visits 9    Date for PT Re-Evaluation 07/10/23    PT Start Time 1207    PT Stop Time 1249    PT Time Calculation (min) 42 min    Activity Tolerance Patient tolerated treatment well    Behavior During Therapy [facility] for tasks assessed/performed             Past Medical History:  Diagnosis Date   Anal cancer (HCC) dx'd 05/2019   Anxiety    Arthritis    Bilateral breast cancer (HCC) 10/21/2013   Breast cancer (HCC)    Cancer (HCC)    Depression    Gallstones    GERD (gastroesophageal reflux disease)    doesn't take any meds for this   Headache    h/o migraines       History of bladder infections    History of hiatal hernia    History of migraine    Hyperlipidemia    takes Simvastatin daily   Hypertension    takes Hyzaar   Joint pain    Joint swelling    Lumbar stenosis    Neuropathy 09/07/2013   Pneumonia    Pre-diabetes    Past Surgical History:  Procedure Laterality Date   ABDOMINAL HYSTERECTOMY     bone spur removed from left foot     CHOLECYSTECTOMY     COLONOSCOPY     COLONOSCOPY WITH PROPOFOL Bilateral 12/07/2021   Procedure: COLONOSCOPY WITH PROPOFOL;  Surgeon: Vida Rigger, MD;  Location: [facility];  Service: Endoscopy;  Laterality: Bilateral;   double mastectomy      ESOPHAGOGASTRODUODENOSCOPY N/A 12/07/2021   Procedure: ESOPHAGOGASTRODUODENOSCOPY (EGD);  Surgeon: Vida Rigger, MD;  Location: [facility];  Service: Endoscopy;  Laterality: N/A;   HERNIA REPAIR     umbilical   IR IMAGING GUIDED PORT INSERTION  06/25/2022   right knee arthroscopy     right shoulder arthroscopy     surgery for hiatal hernia     TONSILLECTOMY     TOTAL HIP ARTHROPLASTY Left 02/12/2013   TOTAL HIP ARTHROPLASTY Left  02/12/2013   Procedure: LEFT TOTAL HIP ARTHROPLASTY;  Surgeon: Nestor Lewandowsky, MD;  Location: [facility] OR;  Service: Orthopedics;  Laterality: Left;   TOTAL HIP ARTHROPLASTY Right 05/03/2013   Procedure: TOTAL HIP ARTHROPLASTY;  Surgeon: Nestor Lewandowsky, MD;  Location: [facility] OR;  Service: Orthopedics;  Laterality: Right;   TOTAL KNEE ARTHROPLASTY Right 02/27/2015   Procedure: TOTAL KNEE ARTHROPLASTY;  Surgeon: Gean Birchwood, MD;  Location: [facility] OR;  Service: Orthopedics;  Laterality: Right;   TOTAL SHOULDER ARTHROPLASTY Right 09/12/2016   Procedure: RIGHT TOTAL SHOULDER ARTHROPLASTY;  Surgeon: Jones Broom, MD;  Location: [facility] OR;  Service: Orthopedics;  Laterality: Right;  RIGHT TOTAL SHOULDER ARTHROPLASTY   Patient Active Problem List   Diagnosis Date Noted   Syncope and collapse 12/29/2022   Hypomagnesemia 12/29/2022   Perianal pain 11/05/2022   Vaginal candidiasis 11/04/2022   Hypokalemia 10/28/2022   Pancytopenia (HCC) 10/28/2022   Port-A-Cath in place 10/01/2022   Chemotherapy-induced neuropathy (HCC) 09/03/2022   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   Incontinence of feces 09/28/2020   Essential  hypertension 09/27/2020   GERD without esophagitis 09/27/2020   History of bilateral mastectomy 09/27/2020   Hyperglycemia 09/27/2020   Moderate recurrent major depression (HCC) 09/27/2020   Pure hypercholesterolemia 09/27/2020   Anal cancer (HCC) 06/13/2019   Status post total shoulder arthroplasty, right 09/12/2016   Arthritis of knee 02/27/2015   Primary osteoarthritis of right knee Valgus 02/24/2015   Neuropathy 09/07/2013   Breast cancer of upper-outer quadrant of left female breast (HCC) 08/09/2013   Breast cancer of upper-outer quadrant of right female breast (HCC) 08/09/2013   Osteoarthritis of right hip 05/03/2013   Osteoarthritis of left hip 02/15/2013    PCP: Dr. 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 I don't feel like I am doing good with the massage.   PERTINENT HISTORY:  Bil breast cancer in 2008 with lumpectomy and SLNB, then bil mastectomy, Completed chemo and radiation. Nodes removed bil axilla - pt unsure of how many (surgery was in [place])  Then Anal cancer in 2020 with chemotherapy and radiation. Diagnosis of T cell lymphoma in 2023. on chemotherapy and just started palliative radiation to bilateral neck will complete on 06/16/23.   PATIENT GOALS:   to be educated about the signs and symptoms of lymphedema and learn post op HEP.   PAIN:  Are you having pain? Yes: NPRS scale: 4/10 Pain location: throat Pain description: burning Aggravating factors: eating, drinking, swallowing Relieving factors: nothing  PRECAUTIONS: Active CA and Comment (R shoulder replacement, R knee replacement, R THA, L THA)  RED FLAGS: Bowel or bladder incontinence: Yes: hx of anal cancer    WEIGHT BEARING RESTRICTIONS: No  FALLS:  Has patient fallen in last 6 months? No Does the patient have a fear of falling that limits activity? 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  LEISURE: walks  2-3x/wk for about 15 min  PRIOR LEVEL OF FUNCTION: Independent   OBJECTIVE:  COGNITION: Overall cognitive status: Within functional limits for tasks assessed                  POSTURE:  Forward head and rounded shoulders posture  30 SEC SIT TO STAND:  Unable to stand from a chair without use of UEs  SHOULDER AROM:    L WFL, R limited by about 60% and reports it has been this way since shoulder replacement   CERVICAL AROM:   Percent limited  Flexion 50% limited and feels swelling is limiting motion  Extension 25% limited  Right lateral flexion 25% limited  Left lateral flexion WFL  Right rotation 25% limited  Left rotation 25% limited    (Blank rows=not tested)  LYMPHEDEMA ASSESSMENT:    Circumference in cm  4 cm superior to  sternal notch around neck 39.9  6 cm superior to sternal notch around neck 39  8 cm superior to sternal notch around neck 39.4  R lateral nostril from base of nose to medial tragus 11.9  L lateral nostril from base of nose to medial tragus 12.2  R corner of mouth to where ear lobe meets face 10.5  L corner of mouth to where ear lobe meets face 10        (Blank rows=not tested)  GAIT: Assessed: Yes Assistance needed: Modified Independent Ambulation Distance: 20 feet Assistive Device: none Gait pattern: partial step through, shortened step length, decreased foot clearance, decreased arm swing bilaterally Ambulation surface: Level  TREATMENT PERFORMED: 07/02/23: Instructed pt using Norton anterior approach handout as follows: short neck, 5 diaphragmatic breaths, bilateral axillary nodes, bilateral pectoral nodes, anterior chest, short neck, posterior neck moving fluid towards pathway aimed at lateral neck, then lateral and anterior neck moving fluid towards pathway aimed at lateral neck then retracing all steps. Had pt return demonstrate each step and provided v/c and t/c for correct skin stretch technique and sequence. Pt required mod cueing for  proximal steps but required less cueing during steps on the neck. Pt completed first half of the sequence with therapist v/c and t/c and then therapist completed 2nd half of session.   06/30/23: Instructed pt using Norton anterior approach handout as follows: short neck, 5 diaphragmatic breaths, bilateral axillary nodes, bilateral pectoral nodes, anterior chest, short neck, posterior neck moving fluid towards pathway aimed at lateral neck, then lateral and anterior neck moving fluid towards pathway aimed at lateral neck then retracing all steps. Had pt return demonstrate each step and provided v/c and t/c for correct skin stretch tehcnique and sequence. Issued handout for pt to practice    06/12/23: Created foam chip pack in juzo cotton stockinette for pt to wear around head and neck for compression to decrease lymphedema  PATIENT EDUCATION:  Education details: Neck ROM, importance of posture when sitting, standing and lying down, deep breathing, walking program and importance of staying active throughout treatment, CURE article on staying active, "Why exercise?" flyer, lymphedema and PT info Person educated: Patient Education method: Explanation, Demonstration, Handout Education comprehension: Patient verbalized understanding and returned demonstration  HOME EXERCISE PROGRAM: Patient was instructed today in a home exercise program today for head and neck range of motion exercises. These included active cervical flexion, active cervical extension, active cervical rotation to each direction, upper trap stretch, and shoulder retraction. Patient was encouraged to do these 2-3 times a day, holding for 5 sec each and completing for 5 reps. Pt was educated that once this becomes easier then hold the stretches for 30-60 seconds.  Wear foam chip pack for at least 4 hours/day, do not tie too tightly  ASSESSMENT:  CLINICAL IMPRESSION: Continued instructing pt in self MLD today and had her return demonstrate  each step while therapist provided appropriate v/c and t/c. Pt required mod to max cueing intially with proximal drainage sequence but then required less cues for proper skin stretch and pressure on neck.   Pt will benefit from skilled therapeutic intervention to improve on the following deficits: Decreased knowledge of precautions and postural dysfunction. Other deficits: decreased knowledge of condition, decreased knowledge of use of DME, increased edema, increased fascial restrictions, postural dysfunction, and pain  PT treatment/interventions: ADL/self-care home management, pt/family education, therapeutic exercise. Other interventions Therapeutic exercises, Therapeutic activity, Patient/Family education, Self Care, Joint mobilization, Orthotic/Fit training, Manual lymph drainage, Compression bandaging,  Taping, Vasopneumatic device, Manual therapy, and Re-evaluation  REHAB POTENTIAL: Good  CLINICAL DECISION MAKING: Stable/uncomplicated  EVALUATION COMPLEXITY: Low   GOALS: Goals reviewed with patient? YES  LONG TERM GOALS: (STG=LTG)   Name Target Date  Goal status  1 Patient will be able to verbalize understanding of a home exercise program for cervical range of motion, posture, and walking.   Baseline:  No knowledge 06/12/2023 Achieved at eval  2 Patient will be able to verbalize understanding of proper sitting and standing posture. Baseline:  No knowledge 06/12/2023 Achieved at eval  3 Patient will be able to verbalize understanding of lymphedema risk and availability of treatment for this condition Baseline:  No knowledge 06/12/2023 Achieved at eval  4 Pt will be independent in self MLD for long term management of lymphedema. Baseline: See objective measurements taken today. 07/10/23 New  5 Pt will obtain an appropriate compression garmetn for long term management of lymphedema. 07/10/23 NEW  6 Pt will demonstrate a 0.5 cm decrease in edema from R corner of mouth to where ear lobe  meets face. 07/10/23 NEW    PLAN:  PT FREQUENCY/DURATION: 2x/wk for 4 wks  PLAN FOR NEXT SESSION: continue MLD to head and neck using norton anterior approach handout, show pt tribute night garment and educate, once lymphedema goals are met assess strength and add to POC  Patient will follow up at outpatient cancer rehab 2 weeks after completion of radiation.  If the patient requires physical therapy at that time, a specific plan will be dictated and sent to the referring physician for approval. The patient was educated today on appropriate basic range of motion exercises to begin now and continue throughout radiation and educated on the signs and symptoms of lymphedema. Patient verbalized good understanding.     Physical Therapy Information for During and After Head/Neck Cancer Treatment: Lymphedema is a swelling condition that you may be at risk for in your neck and/or face if you have radiation treatment to the area and/or if you have surgery that includes removing lymph nodes.  There is treatment available for this condition and it is not life-threatening.  Contact your physician or physical therapist with concerns. An excellent resource for those seeking information on lymphedema is the National Lymphedema Network's website.  It can be accessed at www.lymphnet.org If you notice swelling in your neck or face at any time following surgery (even if it is many years from now), please contact your doctor or physical therapist to discuss this.  Lymphedema can be treated at any time but it is easier for you if it is treated early on. If you have had surgery to your neck, please check with your surgeon about how soon to start doing neck range of motion exercises.  If you are not having surgery, I encourage you to start doing neck range of motion exercises today and continue these while undergoing treatment, UNLESS you have irritation of your skin or soft tissue that is aggravated by doing them.  These  exercises are intended to help you prevent loss of range of motion and/or to gain range of motion in your neck (which can be limited by tightening effects of radiation), and NOT to aggravate these tissues if they develop sensitivities from treatment. Neck range of motion exercises should be done to the point of feeling a GENTLE, TOLERABLE stretch only.  You are encouraged to start a walking or other exercise program tomorrow and continue this as much as you are able through  and after treatment.  Please feel free to call me with any questions. Leonette Most, PT, CLT Physical Therapist and Certified Lymphedema Therapist [facility] [address], [address], [place], [place] [address] [phone] [email or web address]  WALKING  Walking is a great form of exercise to increase your strength, endurance and overall fitness.  A walking program can help you start slowly and gradually build endurance as you go.  Everyone's ability is different, so each person's starting point will be different.  You do not have to follow them exactly.  The are just samples. You should simply find out what's right for you and stick to that program.   In the beginning, you'll start off walking 2-3 times a day for short distances.  As you get stronger, you'll be walking further at just 1-2 times per day.  A. You Can Walk For A Certain Length Of Time Each Day    Walk 5 minutes 3 times per day.  Increase 2 minutes every 2 days (3 times per day).  Work up to 25-30 minutes (1-2 times per day).   Example:   Day 1-2 5 minutes 3 times per day   Day 7-8 12 minutes 2-3 times per day   Day 13-14 25 minutes 1-2 times per day  B. You Can Walk For a Certain Distance Each Day     Distance can be substituted for time.    Example:   3 trips to mailbox (at road)   3 trips to corner of block   3 trips around the block  C. Go to local high school and use the track.    Walk for distance  ____ around track  Or time ____ minutes  D. Walk ____ Jog ____ Run ___   Why exercise?  So many benefits! Here are SOME of them: Heart health, including raising your good cholesterol level and reducing heart rate and blood pressure Lung health, including improved lung capacity It burns fats, and most of [name] can stand to be leaner, whether or not we are overweight. It increases the body's natural painkillers and mood elevators, so makes you feel better. Not only makes you feel better, but look better too Improves sleep Takes a bite out of stress May decrease your risk of many types of cancer If you are currently undergoing cancer treatment, exercise may improve your ability to tolerate treatments including chemotherapy. For everybody, it can improve your energy level. Those with cancer-related fatigue report a 40-50% reduction in this symptom when exercising regularly. If you are a survivor of breast, colon, or prostate cancer, it may decrease your risk of a recurrence. (This may hold for other cancers too, but so far we have data just for these three types.)  How to exercise: Get your doctor's okay. Pick something you enjoy doing, like walking, Zumba, biking, swimming, or whatever. Start at low intensity and time, then gradually increase.  (See walking program handout.) Set a goal to achieve over time.  The American Cancer Society, American Heart Association, and U.S. Dept. of Health and Human Services recommend 150 minutes of moderate exercise, 75 minutes of vigorous exercise, or a combination of both per week. This should be done in episodes at least 10 minutes long, spread throughout the week.  Need help being motivated? Pick something you enjoy doing, because you'll be more inclined to stick with that activity than something that feels like a chore. Do it with a friend so that you are accountable to  each other. Schedule it into your day. Place it on your calendar and keep that  appointment just like you do any appointment that you make. Join an exercise group that meets at a specific time.  That way, you have to show up on time, and that makes it harder to procrastinate about doing your workout.  It also keeps you accountable--people begin to expect you to be there. Join a gym where you feel comfortable and not intimidated, at the right cost. Sign up for something that you'll need to be in shape for on a specific date, like a 1K or a 5K to walk or run, a 20 or 30 mile bike ride, a mud run or something like that. If the date is looming, you know you'll need to train to be ready for it.  An added benefit is that many of these are fundraisers for good causes. If you've already paid for a gym membership, group exercise class or event, you might as well work out, so you haven't wasted your money!    Milagros Loll [place], PT 07/02/2023, 12:55 PM

## 2023-07-03 ENCOUNTER — Encounter: Payer: Self-pay | Admitting: Hematology

## 2023-07-07 ENCOUNTER — Encounter: Payer: Self-pay | Admitting: Physical Therapy

## 2023-07-07 ENCOUNTER — Telehealth: Payer: Self-pay | Admitting: Physical Therapy

## 2023-07-07 ENCOUNTER — Ambulatory Visit: Payer: PPO | Admitting: Physical Therapy

## 2023-07-07 NOTE — Telephone Encounter (Signed)
Left message regarding missing appointment and reminded pt of next scheduled appointment   Cynthia Velasquez, PT 07/07/23 12:57 PM

## 2023-07-08 ENCOUNTER — Encounter: Payer: Self-pay | Admitting: Hematology

## 2023-07-10 ENCOUNTER — Encounter: Payer: Self-pay | Admitting: Physical Therapy

## 2023-07-10 ENCOUNTER — Ambulatory Visit: Payer: PPO | Admitting: Physical Therapy

## 2023-07-10 DIAGNOSIS — C8448 Peripheral T-cell lymphoma, not classified, lymph nodes of multiple sites: Secondary | ICD-10-CM

## 2023-07-10 DIAGNOSIS — M7989 Other specified soft tissue disorders: Secondary | ICD-10-CM | POA: Diagnosis not present

## 2023-07-10 DIAGNOSIS — I890 Lymphedema, not elsewhere classified: Secondary | ICD-10-CM

## 2023-07-10 DIAGNOSIS — L599 Disorder of the skin and subcutaneous tissue related to radiation, unspecified: Secondary | ICD-10-CM

## 2023-07-10 DIAGNOSIS — M79622 Pain in left upper arm: Secondary | ICD-10-CM | POA: Diagnosis not present

## 2023-07-10 NOTE — Therapy (Signed)
OUTPATIENT PHYSICAL THERAPY HEAD AND NECK BASELINE TREATMENT   Patient Name: Cynthia Velasquez MRN: [id number] DOB:04/11/1951, 72 y.o., female Today's Date: 07/10/2023  END OF SESSION:  PT End of Session - 07/10/23 1005     Visit Number 3   unchanged - arrived no charge   Number of Visits 9    Date for PT Re-Evaluation 07/10/23    PT Start Time 1004    PT Stop Time 1015    PT Time Calculation (min) 11 min    Activity Tolerance Treatment limited secondary to medical complications (Comment)    Behavior During Therapy [facility] for tasks assessed/performed             Past Medical History:  Diagnosis Date   Anal cancer (HCC) dx'd 05/2019   Anxiety    Arthritis    Bilateral breast cancer (HCC) 10/21/2013   Breast cancer (HCC)    Cancer (HCC)    Depression    Gallstones    GERD (gastroesophageal reflux disease)    doesn't take any meds for this   Headache    h/o migraines       History of bladder infections    History of hiatal hernia    History of migraine    Hyperlipidemia    takes Simvastatin daily   Hypertension    takes Hyzaar   Joint pain    Joint swelling    Lumbar stenosis    Neuropathy 09/07/2013   Pneumonia    Pre-diabetes    Past Surgical History:  Procedure Laterality Date   ABDOMINAL HYSTERECTOMY     bone spur removed from left foot     CHOLECYSTECTOMY     COLONOSCOPY     COLONOSCOPY WITH PROPOFOL Bilateral 12/07/2021   Procedure: COLONOSCOPY WITH PROPOFOL;  Surgeon: Vida Rigger, MD;  Location: [facility];  Service: Endoscopy;  Laterality: Bilateral;   double mastectomy      ESOPHAGOGASTRODUODENOSCOPY N/A 12/07/2021   Procedure: ESOPHAGOGASTRODUODENOSCOPY (EGD);  Surgeon: Vida Rigger, MD;  Location: [facility];  Service: Endoscopy;  Laterality: N/A;   HERNIA REPAIR     umbilical   IR IMAGING GUIDED PORT INSERTION  06/25/2022   right knee arthroscopy     right shoulder arthroscopy     surgery for hiatal hernia     TONSILLECTOMY     TOTAL HIP  ARTHROPLASTY Left 02/12/2013   TOTAL HIP ARTHROPLASTY Left 02/12/2013   Procedure: LEFT TOTAL HIP ARTHROPLASTY;  Surgeon: Nestor Lewandowsky, MD;  Location: [facility];  Service: Orthopedics;  Laterality: Left;   TOTAL HIP ARTHROPLASTY Right 05/03/2013   Procedure: TOTAL HIP ARTHROPLASTY;  Surgeon: Nestor Lewandowsky, MD;  Location: [facility];  Service: Orthopedics;  Laterality: Right;   TOTAL KNEE ARTHROPLASTY Right 02/27/2015   Procedure: TOTAL KNEE ARTHROPLASTY;  Surgeon: Gean Birchwood, MD;  Location: [facility];  Service: Orthopedics;  Laterality: Right;   TOTAL SHOULDER ARTHROPLASTY Right 09/12/2016   Procedure: RIGHT TOTAL SHOULDER ARTHROPLASTY;  Surgeon: Jones Broom, MD;  Location: [facility];  Service: Orthopedics;  Laterality: Right;  RIGHT TOTAL SHOULDER ARTHROPLASTY   Patient Active Problem List   Diagnosis Date Noted   Syncope and collapse 12/29/2022   Hypomagnesemia 12/29/2022   Perianal pain 11/05/2022   Vaginal candidiasis 11/04/2022   Hypokalemia 10/28/2022   Pancytopenia (HCC) 10/28/2022   Port-A-Cath in place 10/01/2022   Chemotherapy-induced neuropathy (HCC) 09/03/2022   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  Incontinence of feces 09/28/2020   Essential hypertension 09/27/2020   GERD without esophagitis 09/27/2020   History of bilateral mastectomy 09/27/2020   Hyperglycemia 09/27/2020   Moderate recurrent major depression (HCC) 09/27/2020   Pure hypercholesterolemia 09/27/2020   Anal cancer (HCC) 06/13/2019   Status post total shoulder arthroplasty, right 09/12/2016   Arthritis of knee 02/27/2015   Primary osteoarthritis of right knee Valgus 02/24/2015   Neuropathy 09/07/2013   Breast cancer of upper-outer quadrant of left female breast (HCC) 08/09/2013   Breast cancer of upper-outer quadrant of right female breast (HCC) 08/09/2013   Osteoarthritis of right hip 05/03/2013   Osteoarthritis of left hip 02/15/2013     PCP: Dr. 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 I just elevated it. I did not go to the doctor. My arm hurt. My forearm was red and warm. The arm is ok. My arm was swollen more than 3 days. Finished with chemo 2-3 weeks ago.   PERTINENT HISTORY:  Bil breast cancer in 2008 with lumpectomy and SLNB, then bil mastectomy, Completed chemo and radiation. Nodes removed bil axilla - pt unsure of how many (surgery was in [place])  Then Anal cancer in 2020 with chemotherapy and radiation. Diagnosis of T cell lymphoma in 2023. on chemotherapy and just started palliative radiation to bilateral neck will complete on 06/16/23.   PATIENT GOALS:   to be educated about the signs and symptoms of lymphedema and learn post op HEP.   PAIN:  Are you having pain? Yes: NPRS scale: 4/10 Pain location: throat Pain description: burning Aggravating factors: eating, drinking, swallowing Relieving factors: nothing  PRECAUTIONS: Active CA and Comment (R shoulder replacement, R knee replacement, R THA, L THA)  RED FLAGS: Bowel or bladder incontinence: Yes: hx of anal cancer    WEIGHT BEARING RESTRICTIONS: No  FALLS:  Has patient fallen in last 6 months? No Does the patient have a fear of falling that limits activity? 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  LEISURE: walks 2-3x/wk for about 15 min  PRIOR LEVEL OF FUNCTION: Independent   OBJECTIVE:  COGNITION: Overall cognitive status: Within functional limits for tasks assessed                  POSTURE:  Forward head and rounded shoulders posture  30 SEC SIT TO STAND:  Unable to stand from a chair without use of UEs  SHOULDER AROM:    L WFL, R limited by about 60% and reports it has been this way since shoulder replacement   CERVICAL AROM:   Percent limited  Flexion 50% limited and feels swelling is limiting motion  Extension 25% limited  Right  lateral flexion 25% limited  Left lateral flexion WFL  Right rotation 25% limited  Left rotation 25% limited    (Blank rows=not tested)  LYMPHEDEMA ASSESSMENT:    Circumference in cm  4 cm superior to sternal notch around neck 39.9  6 cm superior to sternal notch around neck 39  8 cm superior to sternal notch around neck 39.4  R lateral nostril from base of nose to medial tragus 11.9  L lateral nostril from base of nose to medial tragus 12.2  R corner of mouth to where ear lobe meets face 10.5  L corner of mouth to where ear lobe meets face 10        (Blank rows=not tested)  GAIT: Assessed: Yes Assistance needed: Modified Independent Ambulation Distance: 20 feet Assistive Device: none Gait pattern: partial step through, shortened step length, decreased foot clearance, decreased arm swing bilaterally Ambulation surface: Level  TREATMENT PERFORMED: 07/10/23: See assessment  07/02/23: Instructed pt using Norton anterior approach handout as follows: short neck, 5 diaphragmatic breaths, bilateral axillary nodes, bilateral pectoral nodes, anterior chest, short neck, posterior neck moving fluid towards pathway aimed at lateral neck,  then lateral and anterior neck moving fluid towards pathway aimed at lateral neck then retracing all steps. Had pt return demonstrate each step and provided v/c and t/c for correct skin stretch technique and sequence. Pt required mod cueing for proximal steps but required less cueing during steps on the neck. Pt completed first half of the sequence with therapist v/c and t/c and then therapist completed 2nd half of session.   06/30/23: Instructed pt using Norton anterior approach handout as follows: short neck, 5 diaphragmatic breaths, bilateral axillary nodes, bilateral pectoral nodes, anterior chest, short neck, posterior neck moving fluid towards pathway aimed at lateral neck, then lateral and anterior neck moving fluid towards pathway aimed at lateral neck then retracing all steps. Had pt return demonstrate each step and provided v/c and t/c for correct skin stretch tehcnique and sequence. Issued handout for pt to practice    06/12/23: Created foam chip pack in juzo cotton stockinette for pt to wear around head and neck for compression to decrease lymphedema  PATIENT EDUCATION:  Education details: Neck ROM, importance of posture when sitting, standing and lying down, deep breathing, walking program and importance of staying active throughout treatment, CURE article on staying active, "Why exercise?" flyer, lymphedema and PT info Person educated: Patient Education method: Explanation, Demonstration, Handout Education comprehension: Patient verbalized understanding and returned demonstration  HOME EXERCISE PROGRAM: Patient was instructed today in a home exercise program today for head and neck range of motion exercises. These included active cervical flexion, active cervical extension, active cervical rotation to each direction, upper trap stretch, and shoulder retraction. Patient was encouraged to do these 2-3 times a day, holding for 5 sec each and completing for 5 reps. Pt was educated that once  this becomes easier then hold the stretches for 30-60 seconds.  Wear foam chip pack for at least 4 hours/day, do not tie too tightly  ASSESSMENT:  CLINICAL IMPRESSION: Pt reports her L arm became swollen, red, and warm on 10/21. Her fingers were very swollen and she had pain in her arm. She did not seek medical advice. The swelling has decreased. Advised pt to go to her primary care doctor to have this checked to rule out blood clot or infection. Did not perform treatment today as a precaution.  Pt will benefit from skilled therapeutic intervention to improve on the  following deficits: Decreased knowledge of precautions and postural dysfunction. Other deficits: decreased knowledge of condition, decreased knowledge of use of DME, increased edema, increased fascial restrictions, postural dysfunction, and pain  PT treatment/interventions: ADL/self-care home management, pt/family education, therapeutic exercise. Other interventions Therapeutic exercises, Therapeutic activity, Patient/Family education, Self Care, Joint mobilization, Orthotic/Fit training, Manual lymph drainage, Compression bandaging, Taping, Vasopneumatic device, Manual therapy, and Re-evaluation  REHAB POTENTIAL: Good  CLINICAL DECISION MAKING: Stable/uncomplicated  EVALUATION COMPLEXITY: Low   GOALS: Goals reviewed with patient? YES  LONG TERM GOALS: (STG=LTG)   Name Target Date  Goal status  1 Patient will be able to verbalize understanding of a home exercise program for cervical range of motion, posture, and walking.   Baseline:  No knowledge 06/12/2023 Achieved at eval  2 Patient will be able to verbalize understanding of proper sitting and standing posture. Baseline:  No knowledge 06/12/2023 Achieved at eval  3 Patient will be able to verbalize understanding of lymphedema risk and availability of treatment for this condition Baseline:  No knowledge 06/12/2023 Achieved at eval  4 Pt will be independent in self MLD for  long term management of lymphedema. Baseline: See objective measurements taken today. 07/10/23 New  5 Pt will obtain an appropriate compression garmetn for long term management of lymphedema. 07/10/23 NEW  6 Pt will demonstrate a 0.5 cm decrease in edema from R corner of mouth to where ear lobe meets face. 07/10/23 NEW    PLAN:  PT FREQUENCY/DURATION: 2x/wk for 4 wks  PLAN FOR NEXT SESSION: continue MLD to head and neck using norton anterior approach handout, show pt tribute night garment and educate, once lymphedema goals are met assess strength and add to POC  Patient will follow up at outpatient cancer rehab 2 weeks after completion of radiation.  If the patient requires physical therapy at that time, a specific plan will be dictated and sent to the referring physician for approval. The patient was educated today on appropriate basic range of motion exercises to begin now and continue throughout radiation and educated on the signs and symptoms of lymphedema. Patient verbalized good understanding.     Physical Therapy Information for During and After Head/Neck Cancer Treatment: Lymphedema is a swelling condition that you may be at risk for in your neck and/or face if you have radiation treatment to the area and/or if you have surgery that includes removing lymph nodes.  There is treatment available for this condition and it is not life-threatening.  Contact your physician or physical therapist with concerns. An excellent resource for those seeking information on lymphedema is the National Lymphedema Network's website.  It can be accessed at www.lymphnet.org If you notice swelling in your neck or face at any time following surgery (even if it is many years from now), please contact your doctor or physical therapist to discuss this.  Lymphedema can be treated at any time but it is easier for you if it is treated early on. If you have had surgery to your neck, please check with your surgeon about  how soon to start doing neck range of motion exercises.  If you are not having surgery, I encourage you to start doing neck range of motion exercises today and continue these while undergoing treatment, UNLESS you have irritation of your skin or soft tissue that is aggravated by doing them.  These exercises are intended to help you prevent loss of range of motion and/or to gain range of motion in your neck (which can be  limited by tightening effects of radiation), and NOT to aggravate these tissues if they develop sensitivities from treatment. Neck range of motion exercises should be done to the point of feeling a GENTLE, TOLERABLE stretch only.  You are encouraged to start a walking or other exercise program tomorrow and continue this as much as you are able through and after treatment.  Please feel free to call me with any questions. Leonette Most, PT, CLT Physical Therapist and Certified Lymphedema Therapist [facility] [address], [address], [place], [place] [address] [phone] [email or web address]  WALKING  Walking is a great form of exercise to increase your strength, endurance and overall fitness.  A walking program can help you start slowly and gradually build endurance as you go.  Everyone's ability is different, so each person's starting point will be different.  You do not have to follow them exactly.  The are just samples. You should simply find out what's right for you and stick to that program.   In the beginning, you'll start off walking 2-3 times a day for short distances.  As you get stronger, you'll be walking further at just 1-2 times per day.  A. You Can Walk For A Certain Length Of Time Each Day    Walk 5 minutes 3 times per day.  Increase 2 minutes every 2 days (3 times per day).  Work up to 25-30 minutes (1-2 times per day).   Example:   Day 1-2 5 minutes 3 times per day   Day 7-8 12 minutes 2-3 times per day   Day  13-14 25 minutes 1-2 times per day  B. You Can Walk For a Certain Distance Each Day     Distance can be substituted for time.    Example:   3 trips to mailbox (at road)   3 trips to corner of block   3 trips around the block  C. Go to local high school and use the track.    Walk for distance ____ around track  Or time ____ minutes  D. Walk ____ Jog ____ Run ___   Why exercise?  So many benefits! Here are SOME of them: Heart health, including raising your good cholesterol level and reducing heart rate and blood pressure Lung health, including improved lung capacity It burns fats, and most of [name] can stand to be leaner, whether or not we are overweight. It increases the body's natural painkillers and mood elevators, so makes you feel better. Not only makes you feel better, but look better too Improves sleep Takes a bite out of stress May decrease your risk of many types of cancer If you are currently undergoing cancer treatment, exercise may improve your ability to tolerate treatments including chemotherapy. For everybody, it can improve your energy level. Those with cancer-related fatigue report a 40-50% reduction in this symptom when exercising regularly. If you are a survivor of breast, colon, or prostate cancer, it may decrease your risk of a recurrence. (This may hold for other cancers too, but so far we have data just for these three types.)  How to exercise: Get your doctor's okay. Pick something you enjoy doing, like walking, Zumba, biking, swimming, or whatever. Start at low intensity and time, then gradually increase.  (See walking program handout.) Set a goal to achieve over time.  The American Cancer Society, American Heart Association, and U.S. Dept. of Health and Human Services recommend 150 minutes of moderate exercise, 75 minutes of vigorous exercise, or  a combination of both per week. This should be done in episodes at least 10 minutes long, spread throughout the  week.  Need help being motivated? Pick something you enjoy doing, because you'll be more inclined to stick with that activity than something that feels like a chore. Do it with a friend so that you are accountable to each other. Schedule it into your day. Place it on your calendar and keep that appointment just like you do any appointment that you make. Join an exercise group that meets at a specific time.  That way, you have to show up on time, and that makes it harder to procrastinate about doing your workout.  It also keeps you accountable--people begin to expect you to be there. Join a gym where you feel comfortable and not intimidated, at the right cost. Sign up for something that you'll need to be in shape for on a specific date, like a 1K or a 5K to walk or run, a 20 or 30 mile bike ride, a mud run or something like that. If the date is looming, you know you'll need to train to be ready for it.  An added benefit is that many of these are fundraisers for good causes. If you've already paid for a gym membership, group exercise class or event, you might as well work out, so you haven't wasted your money!    [facility] [place], PT 07/10/2023, 10:51 AM

## 2023-07-12 ENCOUNTER — Other Ambulatory Visit: Payer: Self-pay

## 2023-07-14 ENCOUNTER — Encounter: Payer: Self-pay | Admitting: Physical Therapy

## 2023-07-14 ENCOUNTER — Ambulatory Visit: Payer: PPO | Admitting: Physical Therapy

## 2023-07-14 ENCOUNTER — Encounter: Payer: Self-pay | Admitting: Hematology

## 2023-07-14 ENCOUNTER — Other Ambulatory Visit: Payer: Self-pay

## 2023-07-14 DIAGNOSIS — C8448 Peripheral T-cell lymphoma, not classified, lymph nodes of multiple sites: Secondary | ICD-10-CM

## 2023-07-14 DIAGNOSIS — M6281 Muscle weakness (generalized): Secondary | ICD-10-CM

## 2023-07-14 DIAGNOSIS — R262 Difficulty in walking, not elsewhere classified: Secondary | ICD-10-CM

## 2023-07-14 DIAGNOSIS — I890 Lymphedema, not elsewhere classified: Secondary | ICD-10-CM

## 2023-07-14 DIAGNOSIS — L599 Disorder of the skin and subcutaneous tissue related to radiation, unspecified: Secondary | ICD-10-CM

## 2023-07-14 DIAGNOSIS — Z170 Estrogen receptor positive status [ER+]: Secondary | ICD-10-CM

## 2023-07-14 NOTE — Therapy (Signed)
OUTPATIENT PHYSICAL THERAPY HEAD AND NECK BASELINE TREATMENT   Patient Name: Cynthia Velasquez MRN: [id number] DOB:[birth year], 72 y.o., female Today's Date: 07/14/2023  END OF SESSION:  PT End of Session - 07/14/23 1106     Visit Number 4    Number of Visits 12    Date for PT Re-Evaluation 08/11/23    PT Start Time 1103    PT Stop Time 1155    PT Time Calculation (min) 52 min    Activity Tolerance Patient tolerated treatment well    Behavior During Therapy [facility] for tasks assessed/performed             Past Medical History:  Diagnosis Date   Anal cancer (HCC) dx'd 05/2019   Anxiety    Arthritis    Bilateral breast cancer (HCC) 10/21/2013   Breast cancer (HCC)    Cancer (HCC)    Depression    Gallstones    GERD (gastroesophageal reflux disease)    doesn't take any meds for this   Headache    h/o migraines       History of bladder infections    History of hiatal hernia    History of migraine    Hyperlipidemia    takes Simvastatin daily   Hypertension    takes Hyzaar   Joint pain    Joint swelling    Lumbar stenosis    Neuropathy 09/07/2013   Pneumonia    Pre-diabetes    Past Surgical History:  Procedure Laterality Date   ABDOMINAL HYSTERECTOMY     bone spur removed from left foot     CHOLECYSTECTOMY     COLONOSCOPY     COLONOSCOPY WITH PROPOFOL Bilateral 12/07/2021   Procedure: COLONOSCOPY WITH PROPOFOL;  Surgeon: Vida Rigger, MD;  Location: [facility];  Service: Endoscopy;  Laterality: Bilateral;   double mastectomy      ESOPHAGOGASTRODUODENOSCOPY N/A 12/07/2021   Procedure: ESOPHAGOGASTRODUODENOSCOPY (EGD);  Surgeon: Vida Rigger, MD;  Location: [facility];  Service: Endoscopy;  Laterality: N/A;   HERNIA REPAIR     umbilical   IR IMAGING GUIDED PORT INSERTION  06/25/2022   right knee arthroscopy     right shoulder arthroscopy     surgery for hiatal hernia     TONSILLECTOMY     TOTAL HIP ARTHROPLASTY Left 02/12/2013   TOTAL HIP ARTHROPLASTY Left  02/12/2013   Procedure: LEFT TOTAL HIP ARTHROPLASTY;  Surgeon: Nestor Lewandowsky, MD;  Location: [facility] OR;  Service: Orthopedics;  Laterality: Left;   TOTAL HIP ARTHROPLASTY Right 05/03/2013   Procedure: TOTAL HIP ARTHROPLASTY;  Surgeon: Nestor Lewandowsky, MD;  Location: [facility] OR;  Service: Orthopedics;  Laterality: Right;   TOTAL KNEE ARTHROPLASTY Right 02/27/2015   Procedure: TOTAL KNEE ARTHROPLASTY;  Surgeon: Gean Birchwood, MD;  Location: [facility] OR;  Service: Orthopedics;  Laterality: Right;   TOTAL SHOULDER ARTHROPLASTY Right 09/12/2016   Procedure: RIGHT TOTAL SHOULDER ARTHROPLASTY;  Surgeon: Jones Broom, MD;  Location: [facility] OR;  Service: Orthopedics;  Laterality: Right;  RIGHT TOTAL SHOULDER ARTHROPLASTY   Patient Active Problem List   Diagnosis Date Noted   Syncope and collapse 12/29/2022   Hypomagnesemia 12/29/2022   Perianal pain 11/05/2022   Vaginal candidiasis 11/04/2022   Hypokalemia 10/28/2022   Pancytopenia (HCC) 10/28/2022   Port-A-Cath in place 10/01/2022   Chemotherapy-induced neuropathy (HCC) 09/03/2022   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   Incontinence of feces 09/28/2020   Essential  hypertension 09/27/2020   GERD without esophagitis 09/27/2020   History of bilateral mastectomy 09/27/2020   Hyperglycemia 09/27/2020   Moderate recurrent major depression (HCC) 09/27/2020   Pure hypercholesterolemia 09/27/2020   Anal cancer (HCC) 06/13/2019   Status post total shoulder arthroplasty, right 09/12/2016   Arthritis of knee 02/27/2015   Primary osteoarthritis of right knee Valgus 02/24/2015   Neuropathy 09/07/2013   Breast cancer of upper-outer quadrant of left female breast (HCC) 08/09/2013   Breast cancer of upper-outer quadrant of right female breast (HCC) 08/09/2013   Osteoarthritis of right hip 05/03/2013   Osteoarthritis of left hip 02/15/2013    PCP: Dr. 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 The doctor reported if the swelling comes back to go to the ER. He thought it was fine for me to go back to therapy as long as there was no swelling currently. I have an appointment with my oncologist tomorrow to discuss this. I can feel a knot on the R side.   PERTINENT HISTORY:  Bil breast cancer in 2008 with lumpectomy and SLNB, then bil mastectomy, Completed chemo and radiation. Nodes removed bil axilla - pt unsure of how many (surgery was in [place])  Then Anal cancer in 2020 with chemotherapy and radiation. Diagnosis of T cell lymphoma in 2023. on chemotherapy and just started palliative radiation to bilateral neck will complete on 06/16/23.   PATIENT GOALS:   to be educated about the signs and symptoms of lymphedema and learn post op HEP.   PAIN:  Are you having pain? Yes: NPRS scale: 6/10 Pain location: throat Pain description: feels like a lump in the throat Aggravating factors: eating, drinking, swallowing Relieving factors: magic mouthwash  PRECAUTIONS: Active CA and Comment (R shoulder replacement, R knee replacement, R THA, L THA)  RED FLAGS: Bowel or bladder incontinence: Yes: hx of anal cancer    WEIGHT BEARING RESTRICTIONS: No  FALLS:  Has patient fallen in last 6 months? No Does the patient have a fear of falling that limits activity? 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  LEISURE: walks 2-3x/wk for about 15 min  PRIOR LEVEL OF FUNCTION: Independent   OBJECTIVE:  COGNITION: Overall cognitive status: Within functional limits for tasks assessed                  POSTURE:  Forward head and rounded shoulders posture  30 SEC SIT TO STAND:  Unable to stand from a chair without use of UEs  SHOULDER AROM:    L WFL, R limited by about 60% and reports it has been this way since shoulder replacement  CERVICAL AROM:   Percent limited 07/14/23  Flexion 50% limited and feels swelling is limiting motion WFL  Extension 25% limited 25% limited  Right lateral flexion 25% limited 25% limited  Left lateral flexion [facility] WFL  Right rotation 25% limited WFL  Left rotation 25% limited WFL    (Blank rows=not tested)  LYMPHEDEMA ASSESSMENT:    Circumference in cm 07/14/23  4 cm superior to sternal notch around neck 39.9 37.5  6 cm superior to sternal notch around neck 39 36.5  8 cm superior to sternal notch around neck 39.4 36.9  R lateral nostril from base of nose to medial tragus 11.9 12  L lateral nostril from base of nose to medial tragus 12.2 12.2  R corner of mouth to where ear lobe meets face 10.5 10.5  L corner of mouth to where ear lobe meets face 10 11          (Blank rows=not tested)  GAIT: Assessed: Yes Assistance needed: Modified Independent Ambulation Distance: 20 feet Assistive Device: none Gait pattern: partial step through, shortened step length, decreased foot clearance, decreased arm swing bilaterally Ambulation surface: Level  LOWER EXTREMITY STRENGTH:  MMT Right eval  Hip flexion 3/5  Hip extension 2+/5  Hip  abduction 3/5  Hip adduction   Hip internal rotation   Hip external rotation   Knee flexion 3+/5  Knee extension 4/5  Ankle dorsiflexion 5/5  Ankle plantarflexion   Ankle inversion   Ankle eversion    (Blank rows = not tested)  MMT LEFT eval  Hip flexion 4/5  Hip extension 2/5  Hip abduction 2+/5  Hip adduction   Hip internal rotation   Hip external rotation   Knee flexion 4/5  Knee extension 5/5  Ankle dorsiflexion 5/5  Ankle plantarflexion   Ankle inversion   Ankle eversion    (Blank rows = not tested)  BALANCE: 07/14/23: 13 sec on L    2 sec on R  TREATMENT PERFORMED: 07/14/23- Reassess  07/10/23: See assessment  07/02/23: Instructed pt using Norton anterior approach handout as follows: short neck, 5 diaphragmatic breaths, bilateral axillary nodes, bilateral pectoral nodes, anterior chest, short neck, posterior neck moving fluid towards pathway aimed at lateral neck, then lateral and anterior neck moving fluid towards pathway aimed at lateral neck then retracing all steps. Had pt return demonstrate each step and provided v/c and t/c for correct skin stretch technique and sequence. Pt required mod cueing for proximal steps but required less cueing during steps on the neck. Pt completed first half of the sequence with therapist v/c and t/c and then therapist completed 2nd half of session.   06/30/23: Instructed pt using Norton anterior approach handout as follows: short neck, 5 diaphragmatic breaths, bilateral axillary nodes, bilateral pectoral nodes, anterior chest, short neck, posterior neck moving fluid towards pathway aimed at lateral neck, then lateral and anterior neck moving fluid towards pathway aimed at lateral neck then retracing all steps. Had pt return demonstrate each step and provided v/c and t/c for correct skin stretch tehcnique and sequence. Issued handout for pt to practice    06/12/23: Created foam chip pack in juzo cotton stockinette for pt to wear around  head and neck for compression to decrease lymphedema  PATIENT EDUCATION:  Education details: how to obtain a head and neck compression garment for long term use, sleeve for prophylactic use Person educated: Patient Education method: Explanation, Handout Education comprehension: Patient verbalized understanding  HOME EXERCISE PROGRAM: Self MLD for head  and neck Wear compression garment at least 4 hours a day  ASSESSMENT:  CLINICAL IMPRESSION: Pt went to urgent care but since her arm was not swollen at the time there was no intervention. Pt will see her oncologist this week and discuss this with him. She also has felt a new lump on the right side of her neck. Assessed her LE strength and her balance today. She has significant weakness in her LEs and is unable to stand from a chair wihtout use of her UEs. She is unabel to stand in SLS more than 2 sec. She would benefit from continued skilled PT services to improve bilateral LE strength and improve balance to decrease fall risk and to continue to progress pt towards independence with self MLD.   Pt will benefit from skilled therapeutic intervention to improve on the following deficits: Decreased knowledge of precautions and postural dysfunction. Other deficits: decreased knowledge of condition, decreased knowledge of use of DME, difficulty walking, decreased strength, increased edema, increased fascial restrictions, postural dysfunction, and pain  PT treatment/interventions: ADL/self-care home management, pt/family education, therapeutic exercise. Other interventions 97164- PT Re-evaluation, 97110-Therapeutic exercises, 97530- Therapeutic activity, [id number]- Neuromuscular re-education, 97535- Self Care, 62952- Manual therapy, 97760- Orthotic Fit/training, 97016- Vasopneumatic device, Patient/Family education, Taping, Joint mobilization, Manual lymph drainage, and Compression bandaging  REHAB POTENTIAL: Good  CLINICAL DECISION MAKING:  Stable/uncomplicated  EVALUATION COMPLEXITY: Low   GOALS: Goals reviewed with patient? YES  LONG TERM GOALS: (STG=LTG)   Name Target Date  Goal status  1 Patient will be able to verbalize understanding of a home exercise program for cervical range of motion, posture, and walking.   Baseline:  No knowledge 06/12/2023 Achieved at eval  2 Patient will be able to verbalize understanding of proper sitting and standing posture. Baseline:  No knowledge 06/12/2023 Achieved at eval  3 Patient will be able to verbalize understanding of lymphedema risk and availability of treatment for this condition Baseline:  No knowledge 06/12/2023 Achieved at eval  4 Pt will be independent in self MLD for long term management of lymphedema. Baseline: See objective measurements taken today. 07/10/23 ONGOING 07/14/23  5 Pt will obtain an appropriate compression garmetn for long term management of lymphedema. 07/10/23 IN PROGRESS 07/14/23 - pt plans to purchase online  6 Pt will demonstrate a 0.5 cm decrease in edema from R corner of mouth to where ear lobe meets face. 07/10/23 DEFERRED - pt does not feel like her face is swelling but her neck swelling has decreased 07/14/23  7 Pt will be able to complete SLS for 30 sec bilaterally to decrease fall risk. 08/11/23 NEW   8 Pt will demonstrate 4/5 bilateral hip flexion strength to decrease fall risk. 08/11/23 NEW   9 Pt will demonstrate 4/5 bilateral hip abductor strength to decrease fall risk. 08/11/23 NEW  10  Pt will be independent in a home exercise program for continued stretching and strengthening. 08/11/23 NEW     PLAN:  PT FREQUENCY/DURATION: 2x/wk for 4 wks  PLAN FOR NEXT SESSION: continue to instruct in head and neck norton approach, begin LE strength and balance   Physical Therapy Information for During and After Head/Neck Cancer Treatment: Lymphedema is a swelling condition that you may be at risk for in your neck and/or face if you have radiation  treatment to the area and/or if you have surgery that includes removing lymph nodes.  There is treatment available for this condition and it is not life-threatening.  Contact your physician or  physical therapist with concerns. An excellent resource for those seeking information on lymphedema is the National Lymphedema Network's website.  It can be accessed at www.lymphnet.org If you notice swelling in your neck or face at any time following surgery (even if it is many years from now), please contact your doctor or physical therapist to discuss this.  Lymphedema can be treated at any time but it is easier for you if it is treated early on. If you have had surgery to your neck, please check with your surgeon about how soon to start doing neck range of motion exercises.  If you are not having surgery, I encourage you to start doing neck range of motion exercises today and continue these while undergoing treatment, UNLESS you have irritation of your skin or soft tissue that is aggravated by doing them.  These exercises are intended to help you prevent loss of range of motion and/or to gain range of motion in your neck (which can be limited by tightening effects of radiation), and NOT to aggravate these tissues if they develop sensitivities from treatment. Neck range of motion exercises should be done to the point of feeling a GENTLE, TOLERABLE stretch only.  You are encouraged to start a walking or other exercise program tomorrow and continue this as much as you are able through and after treatment.  Please feel free to call me with any questions. Leonette Most, PT, CLT Physical Therapist and Certified Lymphedema Therapist [facility] [address], [place], [place] [address] [phone] [email or web address]  WALKING  Walking is a great form of exercise to increase your strength, endurance and overall fitness.  A walking program can help you start  slowly and gradually build endurance as you go.  Everyone's ability is different, so each person's starting point will be different.  You do not have to follow them exactly.  The are just samples. You should simply find out what's right for you and stick to that program.   In the beginning, you'll start off walking 2-3 times a day for short distances.  As you get stronger, you'll be walking further at just 1-2 times per day.  A. You Can Walk For A Certain Length Of Time Each Day    Walk 5 minutes 3 times per day.  Increase 2 minutes every 2 days (3 times per day).  Work up to 25-30 minutes (1-2 times per day).   Example:   Day 1-2 5 minutes 3 times per day   Day 7-8 12 minutes 2-3 times per day   Day 13-14 25 minutes 1-2 times per day  B. You Can Walk For a Certain Distance Each Day     Distance can be substituted for time.    Example:   3 trips to mailbox (at road)   3 trips to corner of block   3 trips around the block  C. Go to local high school and use the track.    Walk for distance ____ around track  Or time ____ minutes  D. Walk ____ Jog ____ Run ___   Why exercise?  So many benefits! Here are SOME of them: Heart health, including raising your good cholesterol level and reducing heart rate and blood pressure Lung health, including improved lung capacity It burns fats, and most of [name] can stand to be leaner, whether or not we are overweight. It increases the body's natural painkillers and mood elevators, so makes you feel better. Not only makes  you feel better, but look better too Improves sleep Takes a bite out of stress May decrease your risk of many types of cancer If you are currently undergoing cancer treatment, exercise may improve your ability to tolerate treatments including chemotherapy. For everybody, it can improve your energy level. Those with cancer-related fatigue report a 40-50% reduction in this symptom when exercising regularly. If you are a survivor  of breast, colon, or prostate cancer, it may decrease your risk of a recurrence. (This may hold for other cancers too, but so far we have data just for these three types.)  How to exercise: Get your doctor's okay. Pick something you enjoy doing, like walking, Zumba, biking, swimming, or whatever. Start at low intensity and time, then gradually increase.  (See walking program handout.) Set a goal to achieve over time.  The American Cancer Society, American Heart Association, and U.S. Dept. of Health and Human Services recommend 150 minutes of moderate exercise, 75 minutes of vigorous exercise, or a combination of both per week. This should be done in episodes at least 10 minutes long, spread throughout the week.  Need help being motivated? Pick something you enjoy doing, because you'll be more inclined to stick with that activity than something that feels like a chore. Do it with a friend so that you are accountable to each other. Schedule it into your day. Place it on your calendar and keep that appointment just like you do any appointment that you make. Join an exercise group that meets at a specific time.  That way, you have to show up on time, and that makes it harder to procrastinate about doing your workout.  It also keeps you accountable--people begin to expect you to be there. Join a gym where you feel comfortable and not intimidated, at the right cost. Sign up for something that you'll need to be in shape for on a specific date, like a 1K or a 5K to walk or run, a 20 or 30 mile bike ride, a mud run or something like that. If the date is looming, you know you'll need to train to be ready for it.  An added benefit is that many of these are fundraisers for good causes. If you've already paid for a gym membership, group exercise class or event, you might as well work out, so you haven't wasted your money!    Milagros Loll [place], PT 07/14/2023, 12:17 PM

## 2023-07-15 ENCOUNTER — Other Ambulatory Visit: Payer: Self-pay

## 2023-07-16 ENCOUNTER — Ambulatory Visit: Payer: PPO | Admitting: Physical Therapy

## 2023-07-16 ENCOUNTER — Inpatient Hospital Stay: Payer: PPO

## 2023-07-16 ENCOUNTER — Inpatient Hospital Stay (HOSPITAL_BASED_OUTPATIENT_CLINIC_OR_DEPARTMENT_OTHER): Payer: PPO | Admitting: Hematology

## 2023-07-16 VITALS — BP 123/83 | HR 85 | Temp 97.5°F | Resp 18 | Wt 185.7 lb

## 2023-07-16 DIAGNOSIS — C8448 Peripheral T-cell lymphoma, not classified, lymph nodes of multiple sites: Secondary | ICD-10-CM

## 2023-07-16 DIAGNOSIS — C50412 Malignant neoplasm of upper-outer quadrant of left female breast: Secondary | ICD-10-CM

## 2023-07-16 DIAGNOSIS — C50411 Malignant neoplasm of upper-outer quadrant of right female breast: Secondary | ICD-10-CM

## 2023-07-16 DIAGNOSIS — Z95828 Presence of other vascular implants and grafts: Secondary | ICD-10-CM

## 2023-07-16 LAB — CMP (CANCER CENTER ONLY)
ALT: 21 U/L (ref 0–44)
AST: 37 U/L (ref 15–41)
Albumin: 3.5 g/dL (ref 3.5–5.0)
Alkaline Phosphatase: 75 U/L (ref 38–126)
Anion gap: 5 (ref 5–15)
BUN: 12 mg/dL (ref 8–23)
CO2: 28 mmol/L (ref 22–32)
Calcium: 9.3 mg/dL (ref 8.9–10.3)
Chloride: 108 mmol/L (ref 98–111)
Creatinine: 1.16 mg/dL — ABNORMAL HIGH (ref 0.44–1.00)
GFR, Estimated: 50 mL/min — ABNORMAL LOW (ref >60)
Glucose, Bld: 100 mg/dL — ABNORMAL HIGH (ref 70–99)
Potassium: 3.4 mmol/L — ABNORMAL LOW (ref 3.5–5.1)
Sodium: 141 mmol/L (ref 135–145)
Total Bilirubin: 0.5 mg/dL (ref 0.3–1.2)
Total Protein: 6.3 g/dL — ABNORMAL LOW (ref 6.5–8.1)

## 2023-07-16 LAB — CBC WITH DIFFERENTIAL (CANCER CENTER ONLY)
Abs Immature Granulocytes: 0.02 10*3/uL (ref 0.00–0.07)
Basophils Absolute: 0 10*3/uL (ref 0.0–0.1)
Basophils Relative: 1 %
Eosinophils Absolute: 0.2 10*3/uL (ref 0.0–0.5)
Eosinophils Relative: 5 %
HCT: 32.6 % — ABNORMAL LOW (ref 36.0–46.0)
Hemoglobin: 10.7 g/dL — ABNORMAL LOW (ref 12.0–15.0)
Immature Granulocytes: 1 %
Lymphocytes Relative: 16 %
Lymphs Abs: 0.5 10*3/uL — ABNORMAL LOW (ref 0.7–4.0)
MCH: 27.6 pg (ref 26.0–34.0)
MCHC: 32.8 g/dL (ref 30.0–36.0)
MCV: 84.2 fL (ref 80.0–100.0)
Monocytes Absolute: 0.4 10*3/uL (ref 0.1–1.0)
Monocytes Relative: 12 %
Neutro Abs: 2.1 10*3/uL (ref 1.7–7.7)
Neutrophils Relative %: 65 %
Platelet Count: 181 10*3/uL (ref 150–400)
RBC: 3.87 MIL/uL (ref 3.87–5.11)
RDW: 18.4 % — ABNORMAL HIGH (ref 11.5–15.5)
WBC Count: 3.2 10*3/uL — ABNORMAL LOW (ref 4.0–10.5)
nRBC: 0 % (ref 0.0–0.2)

## 2023-07-16 LAB — LACTATE DEHYDROGENASE: LDH: 1394 U/L — ABNORMAL HIGH (ref 98–192)

## 2023-07-16 MED ORDER — HEPARIN SOD (PORK) LOCK FLUSH 100 UNIT/ML IV SOLN
500.0000 [IU] | Freq: Once | INTRAVENOUS | Status: AC
  Administered 2023-07-16: 500 [IU]

## 2023-07-16 MED ORDER — SODIUM CHLORIDE 0.9% FLUSH
10.0000 mL | Freq: Once | INTRAVENOUS | Status: AC
  Administered 2023-07-16: 10 mL

## 2023-07-16 NOTE — Progress Notes (Signed)
[name]   [phone] [phone]    HEMATOLOGY ONCOLOGY CLINIC VISIT   Patient Care Team: [name], MD as PCP - General (Family Medicine) [name], MD as PCP - Hematology/Oncology (Hematology) [name], MD as Consulting Physician (Gastroenterology) [name], MD as Consulting Physician (Hematology) [name], NP as Nurse Practitioner (Nurse Practitioner) [name], MD as Consulting Physician (Radiation Oncology) [name], MD as Consulting Physician (General Surgery) [name], MD as Attending Physician (Otolaryngology)  Date of Service: 07/16/23    CHIEF COMPLAINT: f/u for evaluation and management of T cell lymphoma.  SUMMARY OF ONCOLOGIC HISTORY: Oncology History Overview Note  Cancer Staging Anal cancer (HCC) Staging form: Anus, AJCC 8th Edition - Clinical stage from 06/28/2019: Stage IIA (cT2, cN0, cM0) - Signed by [name], NP on 06/28/2019  Breast cancer of upper-outer quadrant of left female breast [name]) Staging form: Breast, AJCC 7th Edition - Clinical stage from 09/07/2013: Stage IIA (T2, N0, cM0) - Signed by [name], MD on 09/07/2013 - Pathologic: Stage IIA (T2, N0, cM0) - Signed by [name], MD on 09/07/2013  Breast cancer of upper-outer quadrant of right female breast [name]) Staging form: Breast, AJCC 7th Edition - Clinical stage from 09/07/2013: Stage IA (T1a, N0, cM0) - Signed by [name], MD on 09/07/2013 - Pathologic: Stage IA (T1a, N0, cM0) - Signed by [name], MD on 09/07/2013    Breast cancer of upper-outer quadrant of left female breast (HCC)  07/10/2007 Procedure   US biopsy showed invasive ductal carcinoma 0.8cm, Nottingham grade 3   07/30/2007 Surgery   She had left breast lumpectomy and LN biopsy which showed high grade invasive ductal cancer Nottingham grade 3, 2.7 cm, ER/PR/Her2 neu negative   11/24/2007 Surgery   Patient elected for bilateral  mastectomy   09/07/2008 - 03/08/2009 Chemotherapy   dates are approximate: she received adriamycin and cytoxan followed by Taxol   03/08/2009 - 05/08/2009 Radiation Therapy   dates are approximate, she received XRT    07/09/2021 Imaging   CT CAP  IMPRESSION: 1. No evidence to suggest locally recurrent ianorectal neoplasm or metastatic disease in the chest, abdomen or pelvis. 2. Status post bilateral modified radical mastectomy and bilateral axillary lymph node dissection. 3. Three small supraumbilical ventral hernias containing only omental fat. No associated bowel incarceration or obstruction at this time. 4. Aortic atherosclerosis. 5. Additional incidental findings, as above.   Breast cancer of upper-outer quadrant of right female breast (HCC)  07/10/2006 Procedure   stereotactic biopsy showed atypical hyperplasia   10/23/2006 Surgery   right lumpectomy showed invasive ductal ca (Nottingham grade 1)  2mm and DCIS, ER/PR positive Her 2 negative   10/09/2007 - 10/08/2012 Chemotherapy   Patient was placed on Tamoxifen   03/24/2017 Imaging   No acute findings. No evidence of recurrent carcinoma or metastatic disease   07/09/2021 Imaging   CT CAP  IMPRESSION: 1. No evidence to suggest locally recurrent ianorectal neoplasm or metastatic disease in the chest, abdomen or pelvis. 2. Status post bilateral modified radical mastectomy and bilateral axillary lymph node dissection. 3. Three small supraumbilical ventral hernias containing only omental fat. No associated bowel incarceration or obstruction at this time. 4. Aortic atherosclerosis. 5. Additional incidental findings, as above.   Anal cancer (HCC)  06/04/2019 Procedure   Colonoscopy per Dr. [name]: Findings-the digital rectal exam revealed a 3 cm diameter firm rectal mass.  The mass was noncircumferential and located predominantly at  the right bowel wall at the anorectal junction. A nonobstructing mass was found at the  anus and in the rectum    06/04/2019 Initial Biopsy   Follow pathology: Large intestine, rectum biopsy: Invasive well to moderately differentiated squamous cell carcinoma.  No rectal mucosa present.  There is strong diffuse expression of P 16 immunostain.  CDX 2, p63 and mCEA immunostains are also used in the diagnostic work-up of the case.   06/04/2019 Initial Diagnosis   Anal cancer (HCC)   06/21/2019 PET scan   IMPRESSION: 1. Anorectal primary. No hypermetabolic metastatic disease within the chest, abdomen, or pelvis. Perirectal nodes, at least 1 of which is new since 02/26/2018 CT, suspicious based on size and interval development. 2. Mild limitations secondary to beam hardening artifact from bilateral hip arthroplasty. 3.  Aortic Atherosclerosis (ICD10-I70.0).   06/28/2019 Cancer Staging   Staging form: Anus, AJCC 8th Edition - Clinical stage from 06/28/2019: Stage IIA (cT2, cN0, cM0) - Signed by [name], NP on 06/28/2019   06/28/2019 - 07/26/2019 Chemotherapy   concurrent chemoRT with Mitomycin and 5FU on week 1 and week 5 on starting 06/28/19. Last dose on 07/26/19   06/28/2019 - 08/09/2019 Radiation Therapy   concurrent chemoRT with Dr Mitzi Hansen 06/28/19-08/09/19   11/01/2019 PET scan   IMPRESSION: 1. Marked interval decrease in hypermetabolism noted at the level of the anal rectal primary. No evidence for hypermetabolic metastatic disease in the chest, abdomen, or pelvis. The perirectal lymph nodes identified previously have resolved in the interval. 2.  Aortic Atherosclerois (ICD10-170.0)   08/07/2020 Imaging   CT CAP  IMPRESSION: 1. No findings for residual/recurrent anal tumor, regional lymphadenopathy or metastatic disease. 2. Status post cholecystectomy. No biliary dilatation. 3. Small anterior abdominal wall hernia containing fat. 4. Aortic atherosclerosis.   Aortic Atherosclerosis (ICD10-I70.0).     07/09/2021 Imaging   CT CAP  IMPRESSION: 1. No  evidence to suggest locally recurrent ianorectal neoplasm or metastatic disease in the chest, abdomen or pelvis. 2. Status post bilateral modified radical mastectomy and bilateral axillary lymph node dissection. 3. Three small supraumbilical ventral hernias containing only omental fat. No associated bowel incarceration or obstruction at this time. 4. Aortic atherosclerosis. 5. Additional incidental findings, as above.   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 Mosetta Putt for evaluation and management of newly diagnosed T-cell non-Hodgkin's lymphoma.  Patient has a history of bilateral breast cancer status post mastectomies.  -2007: s/p right lumpectomy for ADH in 08/2006 showed DCIS ER/PR+, grade 1. 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 She was adopted, no known family history    She subsequently was diagnosed with anal squamous cell carcinoma cT2 N0 M0 diagnosed in September 2020.Workup showed a 3 cm mass at the anorectal junction, biopsy confirmed well to moderately differentiated squamous cell carcinoma.  06/21/19 PET scan showed no metastasis.  -S/p concurrent chemoRT with Mitomycin and 5FU on 08/09/19. Now on surveillance. -surveillance CT CAP 07/09/21 was NED. -surveillance colonoscopy done for rectal bleeding on 12/07/21  showed only radiation changes. Her rectal bleeding has resolved.  Recently patient presented to her primary care physician on 05/23/2022 with her new right neck lump which has been progressively enlarging.  She also subsequently developed a left neck swelling.  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  She has had a PET CT scan done on 06/19/2022 which showed hypermetabolic bilateral neck lymphadenopathy.  Also noted to have a new 1 cm focus of metabolic activity in segment 4 of the liver.  This is etiology indeterminate. Lumbar scoliosis and small supraumbilical hernia.  Patient has had Port-A-Cath placement done on 06/25/2022. Echocardiogram was done on 06/17/2022 and shows normal ejection fraction of 70 to 75% with grade 1 diastolic dysfunction.  Right ventricular systolic function within normal limits.  INTERVAL HISTORY  Cynthia Velasquez is a 72 y/o female here for evaluation and management of T-cell non-Hodgkin's lymphoma. Patient was last seen by me on 06/27/2023 and complained of throat/swallowing pain limiting her p.o. intake, lack of taste with food, unusual sticky mouth sensation, thrush symptoms including white spots and bleeding, bumps in bilateral hands, headache x3 days, balance issues, mild diarrhea, chills, abdominal tenderness on palpation.  Today, she returns for toxicity check after her radiation therapy. Patient reports that her throat pain has improved and she is able to eat well. She notes needing to season her meals more due to limited taste. Patient reports a constant lump in her throat making it difficult to swallow. She reports having lumps along her bilateral jawlines and is unsure if this is new. She has no issues with thrush at this time. She reports that she does drink water regularly, though her mouth continues to be dry.   She reports that in the mornings,  she becomes very dizzy and off balance for 1 hour. Patient endorses lightheadedness and spinning sensation during her episodes. She reports that she will need to sit due to feeling overwhelmed. Patient does endorse dizziness while in bed sometimes, mostly when getting up. Her symptoms occur with certain positions of the head more so than others. She reports having a left ear issue. She reports that she has Lorazepam at home.  She reports that she generally drinks 5-6 24 ounce bottles of water daily, though she continues to have dry mouth.   She reports that Sucralfate caused constipation and is considering trying Linzess. Patient did use Linzess previously used which did improve her bowel habits previously. She does endorse abdominal pain due to constipation.  She reports that she had been going to physical therapy for lymphedema. Patient reports that two weeks ago, her left arm and fingers were suddenly swollen, red, and painful and she could not move her left arm. Patient initially thought that she may have been bitten by an insect.   Patient reports that her physical therapist did not massage the area as she was unsure if her symptoms were related to a blood clot. Once her left arm improved, patient did use ice packs and kept her arm elevated. Her swelling has since improved. Patient was seen by her PCP who did not have any concerns regarding her arm swelling.   She complains of frequent urination during the night causing sleeping difficulties.  She complains of random occasional chest pain described as heart burn. Tums does not improve chest pain. She experiences symptoms more when laying back in bed. Her symptoms are not related to exertion.   She reports that she has gained some weight recently.   Patient reports that she is afraid to fall asleep due to worrying about how her disease will play out.   ROS  10 Point review of Systems was done is negative except as noted above.   MEDICAL  HISTORY:  Past Medical History:  Diagnosis Date   Anal cancer (HCC) dx'd 05/2019   Anxiety    Arthritis    Bilateral breast cancer (HCC) 10/21/2013   Breast cancer (HCC)    Cancer (HCC)    Depression    Gallstones    GERD (gastroesophageal reflux disease)    doesn't take any meds for this   Headache    h/o migraines       History of bladder infections    History of hiatal hernia    History of migraine    Hyperlipidemia    takes Simvastatin daily   Hypertension    takes Hyzaar   Joint pain    Joint swelling    Lumbar stenosis    Neuropathy 09/07/2013   Pneumonia    Pre-diabetes     SURGICAL HISTORY: Past Surgical History:  Procedure Laterality Date   ABDOMINAL HYSTERECTOMY     bone spur removed from left foot     CHOLECYSTECTOMY     COLONOSCOPY     COLONOSCOPY WITH PROPOFOL Bilateral 12/07/2021   Procedure: COLONOSCOPY WITH PROPOFOL;  Surgeon: Vida Rigger, MD;  Location: [facility];  Service: Endoscopy;  Laterality: Bilateral;   double mastectomy      ESOPHAGOGASTRODUODENOSCOPY N/A 12/07/2021   Procedure: ESOPHAGOGASTRODUODENOSCOPY (EGD);  Surgeon: Vida Rigger, MD;  Location: [facility];  Service: Endoscopy;  Laterality: N/A;   HERNIA REPAIR     umbilical   IR IMAGING GUIDED PORT INSERTION  06/25/2022   right knee arthroscopy     right shoulder arthroscopy     surgery for hiatal hernia     TONSILLECTOMY     TOTAL HIP ARTHROPLASTY Left 02/12/2013   TOTAL HIP ARTHROPLASTY Left 02/12/2013   Procedure: LEFT TOTAL HIP ARTHROPLASTY;  Surgeon: Nestor Lewandowsky, MD;  Location: [facility] OR;  Service: Orthopedics;  Laterality: Left;   TOTAL HIP ARTHROPLASTY Right 05/03/2013   Procedure: TOTAL HIP ARTHROPLASTY;  Surgeon: Nestor Lewandowsky, MD;  Location: [facility] OR;  Service: Orthopedics;  Laterality: Right;   TOTAL KNEE ARTHROPLASTY Right 02/27/2015   Procedure: TOTAL KNEE ARTHROPLASTY;  Surgeon: Gean Birchwood, MD;  Location: [facility] OR;  Service: Orthopedics;  Laterality: Right;   TOTAL SHOULDER  ARTHROPLASTY Right 09/12/2016   Procedure: RIGHT TOTAL SHOULDER ARTHROPLASTY;  Surgeon: Jones Broom, MD;  Location: [facility] OR;  Service: Orthopedics;  Laterality: Right;  RIGHT TOTAL SHOULDER ARTHROPLASTY    I have reviewed the social history and family history with the patient and they are unchanged from previous note.  ALLERGIES:  is allergic to tramadol, belinostat, codeine, and vicodin [hydrocodone-acetaminophen].  MEDICATIONS:  Current Outpatient Medications  Medication Sig Dispense Refill   aspirin-acetaminophen-caffeine (EXCEDRIN MIGRAINE) 250-250-65 MG tablet Take 2 tablets by mouth every 6 (six) hours as needed for migraine.     DULoxetine (CYMBALTA) 60 MG capsule Take 1 capsule (60 mg total) by mouth 2 (two) times daily. Take 1 capsule (30 mg) at bedtime for 1 week, if no side effects, then  increase to 2 capsules (60 mg) thereafter. 60 capsule 11   fluconazole (DIFLUCAN) 200 MG tablet Take 1 tablet (200 mg total) by mouth daily. 14 tablet 0   fluconazole (DIFLUCAN) 200 MG tablet Take 1 tablet (200 mg total) by mouth daily. 14 tablet 0   losartan (COZAAR) 100 MG tablet Take 100 mg by mouth daily.  6   magic mouthwash (multi-ingredient) oral suspension Take 5 mLs by mouth 3 (three) times daily as needed. 400 mL 1   magic mouthwash w/lidocaine SOLN Take 5 mLs by mouth 3 (three) times daily as needed for mouth pain. Suspension contains total volume 80 ml of distilled water 80ml of maalox 80ml of 2% viscous lidocaine 80ml of nystatin at 500000 units per 5 ml 80ml of diphenhydramine at 12.5mg /63ml 400 mL 1   Menthol-Methyl Salicylate (SALONPAS PAIN RELIEF PATCH EX) Apply 1 patch topically daily as needed (pain).     methocarbamol (ROBAXIN) 750 MG tablet Take 1 tablet (750 mg total) by mouth every 8 (eight) hours as needed for muscle spasms. 20 tablet 0   naloxone (NARCAN) nasal spray 4 mg/0.1 mL 1 squirt in the naris for unresponsiveness 2 each 3   Nystatin (GERHARDT'S BUTT CREAM)  CREA Apply 1 Application topically 2 (two) times daily. (Patient taking differently: Apply 1 Application topically 2 (two) times daily as needed for irritation.) 30 each 0   nystatin (MYCOSTATIN/NYSTOP) powder Apply 1 Application topically 3 (three) times daily. (Patient taking differently: Apply 1 Application topically 3 (three) times daily as needed (redness, rash).) 15 g 0   ondansetron (ZOFRAN) 8 MG tablet Take 1 tablet (8 mg total) by mouth every 8 (eight) hours as needed for nausea or vomiting. 30 tablet 1   pantoprazole (PROTONIX) 40 MG tablet Take 1 tablet (40 mg total) by mouth daily. (Patient taking differently: Take 40 mg by mouth daily as needed (for acid reflux).) 30 tablet 0   potassium chloride SA (KLOR-CON M) 20 MEQ tablet Take 1 tablet (20 mEq total) by mouth 2 (two) times daily. 30 tablet 0   prochlorperazine (COMPAZINE) 10 MG tablet Take 1 tablet (10 mg total) by mouth every 6 (six) hours as needed for nausea or vomiting. 30 tablet 1   sucralfate (CARAFATE) 1 g tablet Take 1 tablet (1 g total) by mouth 2 (two) times daily. Dissolve tab in 5-10 ml of water and then swallow the slurry. 60 tablet 0   sucralfate (CARAFATE) 1 g tablet Dissolve 1 tablet in 5-10ml of water twice a day and then swallow slurry. 60 tablet 0   triamcinolone ointment (KENALOG) 0.5 % Apply 1 Application topically 2 (two) times daily. To rash under both armpits 30 g 0   No current facility-administered medications for this visit.    PHYSICAL EXAMINATION: .BP 123/83   Pulse 85   Temp (!) 97.5 F (36.4 C)   Resp 18   Wt 185 lb 11.2 oz (84.2 kg)   SpO2 95%   BMI 30.90 kg/m    GENERAL:alert, in no acute distress and comfortable SKIN: no acute rashes, no significant lesions EYES: conjunctiva are pink and non-injected, sclera anicteric OROPHARYNX: MMM, no exudates, no oropharyngeal erythema or ulceration NECK: supple, no JVD LYMPH:  no palpable lymphadenopathy in the cervical, axillary or inguinal  regions LUNGS: clear to auscultation b/l with normal respiratory effort HEART: regular rate & rhythm ABDOMEN:  normoactive bowel sounds , non tender, not distended. Extremity: no pedal edema PSYCH: alert & oriented x 3 with  fluent speech NEURO: no focal motor/sensory deficits   LABORATORY DATA:  I have reviewed the data as listed.    Latest Ref Rng & Units 07/16/2023    2:02 PM 06/27/2023   12:40 PM 06/19/2023    4:27 AM  CBC  WBC 4.0 - 10.5 K/uL 3.2  3.9  3.8   Hemoglobin 12.0 - 15.0 g/dL 64.4  03.4  74.2   Hematocrit 36.0 - 46.0 % 32.6  36.4  37.7   Platelets 150 - 400 K/uL 181  185  193    .    Latest Ref Rng & Units 07/16/2023    2:02 PM 06/27/2023   12:40 PM 06/19/2023    4:27 AM  CMP  Glucose 70 - 99 mg/dL 595  90  93   BUN 8 - 23 mg/dL 12  27  24    Creatinine 0.44 - 1.00 mg/dL 6.38  7.56  4.33   Sodium 135 - 145 mmol/L 141  138  143   Potassium 3.5 - 5.1 mmol/L 3.4  2.9  3.3   Chloride 98 - 111 mmol/L 108  102  105   CO2 22 - 32 mmol/L 28  27  23    Calcium 8.9 - 10.3 mg/dL 9.3  9.7  9.7   Total Protein 6.5 - 8.1 g/dL 6.3  6.9  7.4   Total Bilirubin 0.3 - 1.2 mg/dL 0.5  0.5  0.8   Alkaline Phos 38 - 126 U/L 75  73  71   AST 15 - 41 U/L 37  18  18   ALT 0 - 44 U/L 21  10  14     . Lab Results  Component Value Date   LDH 217 (H) 03/07/2023      01/21/2023 Needle Core Biopsy:           RADIOGRAPHIC STUDIES: .No results found.   ASSESSMENT & PLAN:   Harpreet Signore is a 72 y.o.  female with   1. Relapsed 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 Plan -Results of excisional lymph node biopsy showing peripheral T-cell lymphoma not otherwise specified which is CD30 negative were discussed in detail with the patient. PET CT scan was previously reviewed and she appears to have at least stage II disease but depending on the liver lesion etiology this could represent stage IV disease. She has had anthracycline exposure as a part of her AC  regimen for previous treatment of breast cancer and is therefore limited in her total lifetime anthracycline use. CD 30 negative status precludes use of Adcentris.  2. Anal Squamous Cell Carcinoma, cT2N0M0-currently in remission.  Details as noted above   3. 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 Completed tamoxifen 09/2007 - 09/2012 -2008: stage pT2N0 left breast invasive ductal carcinoma, triple negative, grade 3; s/p lumpectomy in 07/2007 and bilateral mastectomy 11/2007 (path negative for residual carcinoma in both breasts); s/p adjuvant chemo AC-T and radiation  4. H/o Chronic diarrhea -- follows with GI  5. Hx of neuropathy from previous chemotherapy  6. Extensive previous exposure to chemotherapy.  PLAN:  -Discussed lab results on 07/16/23 in detail with patient. CBC showed WBC of 3.2K, hemoglobin of 10.7, and platelets of 181K. -some anemia -WBCs slightly low -platelets normal -CMP shows improvement in her kidney numbers.  Kidney numbers improved. Potassium improved to 3.4 -the lump in her throat is likely raw tissue from radiation therapy -recommend  keeping the mouth moisturized by staying well hydrated -Patient likely endorses acid reflux -she may be holding onto fluid causing frequent urination -educated patient that radiation does not control overall disease.  -would recommend holding off on any routine dental cleaning appointments that are not immediately urgent at this time to ensure that she recovers from her recent radiation therapy, her immune system improves, along with other medical considerations.  -discussed options of using Mirolax and Senna to improve constipation -discussed option of OTC Senokot to manage bowel habits -patient has no pain, redness, or swelling in her left arm at this time -no role for an [name] of her left upper extremity at this time -ok to proceed with lymphedema  with physical therapist -her dizziness is likely related to benign positional vertigo  -discussed option of Meclizine if her dizziness is very bothersome -discussed option for her PCP to consider referral to Vestibular PT if her dizziness/off-balance symptoms are bothersome.  -recommendations or other trials from [facility] would also have a bearing on her next treamtent -discussed that there may be a role to be seen at [facility] for considerations of CAR T cell therapies. There may also be a role of potentially proceeding with next line therapy of single agent chemotherapy or oral therapy, both of which may have side affects.  -will plan for PET scan in the next couple of weeks -she has an upcoming radiation appointment -answered patient's questions in detail regarding further treatment   FOLLOW UP: RTC with Dr Candise Che with portflush and labs  The total time spent in the appointment was *** minutes* .  All of the patient's questions were answered with apparent satisfaction. The patient knows to call the clinic with any problems, questions or concerns.   Wyvonnia Lora MD MS AAHIVMS [facility] Hematology/Oncology Physician [facility]  .*Total Encounter Time as defined by the Centers for Medicare and Medicaid Services includes, in addition to the face-to-face time of a patient visit (documented in the note above) non-face-to-face time: obtaining and reviewing outside history, ordering and reviewing medications, tests or procedures, care coordination (communications with other health care professionals or caregivers) and documentation in the medical record.    I,Mitra Faeizi,acting as a Neurosurgeon for Wyvonnia Lora, MD.,have documented all relevant documentation on the behalf of Wyvonnia Lora, MD,as directed by  Wyvonnia Lora, MD while in the presence of Wyvonnia Lora, MD.  ***

## 2023-07-18 ENCOUNTER — Telehealth: Payer: Self-pay

## 2023-07-18 NOTE — Telephone Encounter (Signed)
Request received from [facility] for records regarding patient claim. Request faxed to system wide HIM along with ROI. Fax confirmation received.

## 2023-07-21 ENCOUNTER — Ambulatory Visit: Payer: PPO | Attending: Radiation Oncology | Admitting: Physical Therapy

## 2023-07-21 ENCOUNTER — Encounter: Payer: Self-pay | Admitting: Physical Therapy

## 2023-07-21 DIAGNOSIS — R262 Difficulty in walking, not elsewhere classified: Secondary | ICD-10-CM | POA: Insufficient documentation

## 2023-07-21 DIAGNOSIS — C8448 Peripheral T-cell lymphoma, not classified, lymph nodes of multiple sites: Secondary | ICD-10-CM | POA: Insufficient documentation

## 2023-07-21 DIAGNOSIS — I890 Lymphedema, not elsewhere classified: Secondary | ICD-10-CM | POA: Insufficient documentation

## 2023-07-21 DIAGNOSIS — M6281 Muscle weakness (generalized): Secondary | ICD-10-CM | POA: Diagnosis not present

## 2023-07-21 DIAGNOSIS — L599 Disorder of the skin and subcutaneous tissue related to radiation, unspecified: Secondary | ICD-10-CM | POA: Insufficient documentation

## 2023-07-21 NOTE — Therapy (Signed)
OUTPATIENT PHYSICAL THERAPY HEAD AND NECK BASELINE TREATMENT   Patient Name: Cynthia Velasquez MRN: [id number] DOB:26-May-1951, 72 y.o., female Today's Date: 07/21/2023  END OF SESSION:  PT End of Session - 07/21/23 1108     Visit Number 5    Number of Visits 12    Date for PT Re-Evaluation 08/11/23    PT Start Time 1108   pt arrived late   PT Stop Time 1156    PT Time Calculation (min) 48 min    Activity Tolerance Patient tolerated treatment well    Behavior During Therapy [facility] for tasks assessed/performed             Past Medical History:  Diagnosis Date   Anal cancer (HCC) dx'd 05/2019   Anxiety    Arthritis    Bilateral breast cancer (HCC) 10/21/2013   Breast cancer (HCC)    Cancer (HCC)    Depression    Gallstones    GERD (gastroesophageal reflux disease)    doesn't take any meds for this   Headache    h/o migraines       History of bladder infections    History of hiatal hernia    History of migraine    Hyperlipidemia    takes Simvastatin daily   Hypertension    takes Hyzaar   Joint pain    Joint swelling    Lumbar stenosis    Neuropathy 09/07/2013   Pneumonia    Pre-diabetes    Past Surgical History:  Procedure Laterality Date   ABDOMINAL HYSTERECTOMY     bone spur removed from left foot     CHOLECYSTECTOMY     COLONOSCOPY     COLONOSCOPY WITH PROPOFOL Bilateral 12/07/2021   Procedure: COLONOSCOPY WITH PROPOFOL;  Surgeon: Vida Rigger, MD;  Location: [facility];  Service: Endoscopy;  Laterality: Bilateral;   double mastectomy      ESOPHAGOGASTRODUODENOSCOPY N/A 12/07/2021   Procedure: ESOPHAGOGASTRODUODENOSCOPY (EGD);  Surgeon: Vida Rigger, MD;  Location: [facility];  Service: Endoscopy;  Laterality: N/A;   HERNIA REPAIR     umbilical   IR IMAGING GUIDED PORT INSERTION  06/25/2022   right knee arthroscopy     right shoulder arthroscopy     surgery for hiatal hernia     TONSILLECTOMY     TOTAL HIP ARTHROPLASTY Left 02/12/2013   TOTAL HIP  ARTHROPLASTY Left 02/12/2013   Procedure: LEFT TOTAL HIP ARTHROPLASTY;  Surgeon: Nestor Lewandowsky, MD;  Location: [facility] OR;  Service: Orthopedics;  Laterality: Left;   TOTAL HIP ARTHROPLASTY Right 05/03/2013   Procedure: TOTAL HIP ARTHROPLASTY;  Surgeon: Nestor Lewandowsky, MD;  Location: [facility] OR;  Service: Orthopedics;  Laterality: Right;   TOTAL KNEE ARTHROPLASTY Right 02/27/2015   Procedure: TOTAL KNEE ARTHROPLASTY;  Surgeon: Gean Birchwood, MD;  Location: [facility] OR;  Service: Orthopedics;  Laterality: Right;   TOTAL SHOULDER ARTHROPLASTY Right 09/12/2016   Procedure: RIGHT TOTAL SHOULDER ARTHROPLASTY;  Surgeon: Jones Broom, MD;  Location: [facility] OR;  Service: Orthopedics;  Laterality: Right;  RIGHT TOTAL SHOULDER ARTHROPLASTY   Patient Active Problem List   Diagnosis Date Noted   Syncope and collapse 12/29/2022   Hypomagnesemia 12/29/2022   Perianal pain 11/05/2022   Vaginal candidiasis 11/04/2022   Hypokalemia 10/28/2022   Pancytopenia (HCC) 10/28/2022   Port-A-Cath in place 10/01/2022   Chemotherapy-induced neuropathy (HCC) 09/03/2022   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   Incontinence of feces  09/28/2020   Essential hypertension 09/27/2020   GERD without esophagitis 09/27/2020   History of bilateral mastectomy 09/27/2020   Hyperglycemia 09/27/2020   Moderate recurrent major depression (HCC) 09/27/2020   Pure hypercholesterolemia 09/27/2020   Anal cancer (HCC) 06/13/2019   Status post total shoulder arthroplasty, right 09/12/2016   Arthritis of knee 02/27/2015   Primary osteoarthritis of right knee Valgus 02/24/2015   Neuropathy 09/07/2013   Breast cancer of upper-outer quadrant of left female breast (HCC) 08/09/2013   Breast cancer of upper-outer quadrant of right female breast (HCC) 08/09/2013   Osteoarthritis of right hip 05/03/2013   Osteoarthritis of left hip 02/15/2013    PCP: Dr. 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 The doctor was not concerned about the lump and said I could do physical therapy.    PERTINENT HISTORY:  Bil breast cancer in 2008 with lumpectomy and SLNB, then bil mastectomy, Completed chemo and radiation. Nodes removed bil axilla - pt unsure of how many (surgery was in [place])  Then Anal cancer in 2020 with chemotherapy and radiation. Diagnosis of T cell lymphoma in 2023. on chemotherapy and just started palliative radiation to bilateral neck will complete on 06/16/23.   PATIENT GOALS:   to be educated about the signs and symptoms of lymphedema and learn post op HEP.   PAIN:  Are you having pain? Yes: NPRS scale: 6/10 Pain location: throat Pain description: feels like a lump in the throat Aggravating factors: eating, drinking, swallowing Relieving factors: magic mouthwash  PRECAUTIONS: Active CA and Comment (R shoulder replacement, R knee replacement, R THA, L THA)  RED FLAGS: Bowel or bladder incontinence: Yes: hx of anal cancer    WEIGHT BEARING RESTRICTIONS: No  FALLS:  Has patient fallen in last 6 months? No Does the patient have a fear of falling that limits activity? 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  LEISURE: walks 2-3x/wk for about 15 min  PRIOR LEVEL OF FUNCTION: Independent   OBJECTIVE:  COGNITION: Overall cognitive status: Within functional limits for tasks assessed                  POSTURE:  Forward head and rounded shoulders posture  30 SEC SIT TO STAND:  Unable to stand from a chair without use of UEs  SHOULDER AROM:    L WFL, R limited by about 60% and reports it has been this way since shoulder replacement   CERVICAL AROM:   Percent limited 07/14/23  Flexion 50% limited and feels swelling is limiting motion WFL  Extension 25% limited 25% limited  Right  lateral flexion 25% limited 25% limited  Left lateral flexion [facility] WFL  Right rotation 25% limited WFL  Left rotation 25% limited WFL    (Blank rows=not tested)  LYMPHEDEMA ASSESSMENT:    Circumference in cm 07/14/23  4 cm superior to sternal notch around neck 39.9 37.5  6 cm superior to sternal notch around neck 39 36.5  8 cm superior to sternal notch around neck 39.4 36.9  R lateral nostril from base of nose to medial tragus 11.9 12  L lateral nostril from base of nose to medial tragus 12.2 12.2  R corner of mouth to where ear lobe meets face 10.5 10.5  L corner of mouth to where ear lobe meets face 10 11          (Blank rows=not tested)  GAIT: Assessed: Yes Assistance needed: Modified Independent Ambulation Distance: 20 feet Assistive Device: none Gait pattern: partial step through, shortened step length, decreased foot clearance, decreased arm swing bilaterally Ambulation surface: Level  LOWER EXTREMITY STRENGTH:  MMT Right eval  Hip flexion 3/5  Hip extension 2+/5  Hip abduction 3/5  Hip adduction   Hip internal rotation   Hip external rotation   Knee flexion 3+/5  Knee extension 4/5  Ankle  dorsiflexion 5/5  Ankle plantarflexion   Ankle inversion   Ankle eversion    (Blank rows = not tested)  MMT LEFT eval  Hip flexion 4/5  Hip extension 2/5  Hip abduction 2+/5  Hip adduction   Hip internal rotation   Hip external rotation   Knee flexion 4/5  Knee extension 5/5  Ankle dorsiflexion 5/5  Ankle plantarflexion   Ankle inversion   Ankle eversion    (Blank rows = not tested)  BALANCE: 07/14/23: 13 sec on L    2 sec on R  TREATMENT PERFORMED: 07/21/23:  Seated TKE with 2lb ankle weights with 5 sec holds x 10 reps each with pt returning therapist demo Seated marching with 2 lb ankle weights x 1 reps bilaterally, was too much resistance so removed ankle weights Seated hip abduction with red theraband x 10 reps with 3 sec holds and v/c for slow movement throughout Nustep - seat at 9, no UEs, level 3 x 10 min 23 sec 571 steps\ 3 way hip machine on 25 lbs x 10 reps each with pt feeling challenged, pt returned therapist demo Seated hamstring stretch x 2 reps x 30 sec holds with pt returning therapist demo Seated piriformis stretch x 2 reps with 30 sec holds with pt returning therapist demo  07/14/23- Reassess  07/10/23: See assessment  07/02/23: Instructed pt using Norton anterior approach handout as follows: short neck, 5 diaphragmatic breaths, bilateral axillary nodes, bilateral pectoral nodes, anterior chest, short neck, posterior neck moving fluid towards pathway aimed at lateral neck, then lateral and anterior neck moving fluid towards pathway aimed at lateral neck then retracing all steps. Had pt return demonstrate each step and provided v/c and t/c for correct skin stretch technique and sequence. Pt required mod cueing for proximal steps but required less cueing during steps on the neck. Pt completed first half of the sequence with therapist v/c and t/c and then therapist completed 2nd half of session.   06/30/23: Instructed pt using Norton anterior approach handout as  follows: short neck, 5 diaphragmatic breaths, bilateral axillary nodes, bilateral pectoral nodes, anterior chest, short neck, posterior neck moving fluid towards pathway aimed at lateral neck, then lateral and anterior neck moving fluid towards pathway aimed  at lateral neck then retracing all steps. Had pt return demonstrate each step and provided v/c and t/c for correct skin stretch tehcnique and sequence. Issued handout for pt to practice    06/12/23: Created foam chip pack in juzo cotton stockinette for pt to wear around head and neck for compression to decrease lymphedema  PATIENT EDUCATION:  Education details: how to obtain a head and neck compression garment for long term use, sleeve for prophylactic use Person educated: Patient Education method: Explanation, Handout Education comprehension: Patient verbalized understanding  HOME EXERCISE PROGRAM: Self MLD for head and neck Wear compression garment at least 4 hours a day  ASSESSMENT:  CLINICAL IMPRESSION: Began strengthening exercises today. Pt felt challenged with seated exercises and 3 way hip. She may be able to tolerate increased resistance on the NuStep at next session pending she does not have a lot of muscle soreness after today's session. Will continue to progress strengthening exercises and add balance exercises in the future.   Pt will benefit from skilled therapeutic intervention to improve on the following deficits: Decreased knowledge of precautions and postural dysfunction. Other deficits: decreased knowledge of condition, decreased knowledge of use of DME, difficulty walking, decreased strength, increased edema, increased fascial restrictions, postural dysfunction, and pain  PT treatment/interventions: ADL/self-care home management, pt/family education, therapeutic exercise. Other interventions 97164- PT Re-evaluation, 97110-Therapeutic exercises, 97530- Therapeutic activity, [id number]- Neuromuscular re-education, 97535- Self  Care, [id number]- Manual therapy, 97760- Orthotic Fit/training, 97016- Vasopneumatic device, Patient/Family education, Taping, Joint mobilization, Manual lymph drainage, and Compression bandaging  REHAB POTENTIAL: Good  CLINICAL DECISION MAKING: Stable/uncomplicated  EVALUATION COMPLEXITY: Low   GOALS: Goals reviewed with patient? YES  LONG TERM GOALS: (STG=LTG)   Name Target Date  Goal status  1 Patient will be able to verbalize understanding of a home exercise program for cervical range of motion, posture, and walking.   Baseline:  No knowledge 06/12/2023 Achieved at eval  2 Patient will be able to verbalize understanding of proper sitting and standing posture. Baseline:  No knowledge 06/12/2023 Achieved at eval  3 Patient will be able to verbalize understanding of lymphedema risk and availability of treatment for this condition Baseline:  No knowledge 06/12/2023 Achieved at eval  4 Pt will be independent in self MLD for long term management of lymphedema. Baseline: See objective measurements taken today. 07/10/23 ONGOING 07/14/23  5 Pt will obtain an appropriate compression garmetn for long term management of lymphedema. 07/10/23 IN PROGRESS 07/14/23 - pt plans to purchase online  6 Pt will demonstrate a 0.5 cm decrease in edema from R corner of mouth to where ear lobe meets face. 07/10/23 DEFERRED - pt does not feel like her face is swelling but her neck swelling has decreased 07/14/23  7 Pt will be able to complete SLS for 30 sec bilaterally to decrease fall risk. 08/11/23 NEW   8 Pt will demonstrate 4/5 bilateral hip flexion strength to decrease fall risk. 08/11/23 NEW   9 Pt will demonstrate 4/5 bilateral hip abductor strength to decrease fall risk. 08/11/23 NEW  10  Pt will be independent in a home exercise program for continued stretching and strengthening. 08/11/23 NEW     PLAN:  PT FREQUENCY/DURATION: 2x/wk for 4 wks  PLAN FOR NEXT SESSION: continue to instruct in head and  neck norton approach, cont LE strength and balance   Physical Therapy Information for During and After Head/Neck Cancer Treatment: Lymphedema is a swelling condition that you may be at risk for in your neck  and/or face if you have radiation treatment to the area and/or if you have surgery that includes removing lymph nodes.  There is treatment available for this condition and it is not life-threatening.  Contact your physician or physical therapist with concerns. An excellent resource for those seeking information on lymphedema is the National Lymphedema Network's website.  It can be accessed at www.lymphnet.org If you notice swelling in your neck or face at any time following surgery (even if it is many years from now), please contact your doctor or physical therapist to discuss this.  Lymphedema can be treated at any time but it is easier for you if it is treated early on. If you have had surgery to your neck, please check with your surgeon about how soon to start doing neck range of motion exercises.  If you are not having surgery, I encourage you to start doing neck range of motion exercises today and continue these while undergoing treatment, UNLESS you have irritation of your skin or soft tissue that is aggravated by doing them.  These exercises are intended to help you prevent loss of range of motion and/or to gain range of motion in your neck (which can be limited by tightening effects of radiation), and NOT to aggravate these tissues if they develop sensitivities from treatment. Neck range of motion exercises should be done to the point of feeling a GENTLE, TOLERABLE stretch only.  You are encouraged to start a walking or other exercise program tomorrow and continue this as much as you are able through and after treatment.  Please feel free to call me with any questions. Leonette Most, PT, CLT Physical Therapist and Certified Lymphedema Therapist [facility] [address], [place], [place] [address] [phone] [email or web address]  WALKING  Walking is a great form of exercise to increase your strength, endurance and overall fitness.  A walking program can help you start slowly and gradually build endurance as you go.  Everyone's ability is different, so each person's starting point will be different.  You do not have to follow them exactly.  The are just samples. You should simply find out what's right for you and stick to that program.   In the beginning, you'll start off walking 2-3 times a day for short distances.  As you get stronger, you'll be walking further at just 1-2 times per day.  A. You Can Walk For A Certain Length Of Time Each Day    Walk 5 minutes 3 times per day.  Increase 2 minutes every 2 days (3 times per day).  Work up to 25-30 minutes (1-2 times per day).   Example:   Day 1-2 5 minutes 3 times per day   Day 7-8 12 minutes 2-3 times per day   Day 13-14 25 minutes 1-2 times per day  B. You Can Walk For a Certain Distance Each Day     Distance can be substituted for time.    Example:   3 trips to mailbox (at road)   3 trips to corner of block   3 trips around the block  C. Go to local high school and use the track.    Walk for distance ____ around track  Or time ____ minutes  D. Walk ____ Jog ____ Run ___   Why exercise?  So many benefits! Here are SOME of them: Heart health, including raising your good cholesterol level and reducing heart rate and blood pressure Lung health, including  improved lung capacity It burns fats, and most of [name] can stand to be leaner, whether or not we are overweight. It increases the body's natural painkillers and mood elevators, so makes you feel better. Not only makes you feel better, but look better too Improves sleep Takes a bite out of stress May decrease your risk of many types of cancer If you are currently undergoing cancer treatment, exercise  may improve your ability to tolerate treatments including chemotherapy. For everybody, it can improve your energy level. Those with cancer-related fatigue report a 40-50% reduction in this symptom when exercising regularly. If you are a survivor of breast, colon, or prostate cancer, it may decrease your risk of a recurrence. (This may hold for other cancers too, but so far we have data just for these three types.)  How to exercise: Get your doctor's okay. Pick something you enjoy doing, like walking, Zumba, biking, swimming, or whatever. Start at low intensity and time, then gradually increase.  (See walking program handout.) Set a goal to achieve over time.  The American Cancer Society, American Heart Association, and U.S. Dept. of Health and Human Services recommend 150 minutes of moderate exercise, 75 minutes of vigorous exercise, or a combination of both per week. This should be done in episodes at least 10 minutes long, spread throughout the week.  Need help being motivated? Pick something you enjoy doing, because you'll be more inclined to stick with that activity than something that feels like a chore. Do it with a friend so that you are accountable to each other. Schedule it into your day. Place it on your calendar and keep that appointment just like you do any appointment that you make. Join an exercise group that meets at a specific time.  That way, you have to show up on time, and that makes it harder to procrastinate about doing your workout.  It also keeps you accountable--people begin to expect you to be there. Join a gym where you feel comfortable and not intimidated, at the right cost. Sign up for something that you'll need to be in shape for on a specific date, like a 1K or a 5K to walk or run, a 20 or 30 mile bike ride, a mud run or something like that. If the date is looming, you know you'll need to train to be ready for it.  An added benefit is that many of these are fundraisers  for good causes. If you've already paid for a gym membership, group exercise class or event, you might as well work out, so you haven't wasted your money!    [facility] [place], PT 07/21/2023, 12:06 PM

## 2023-07-22 ENCOUNTER — Encounter: Payer: Self-pay | Admitting: Hematology

## 2023-07-22 DIAGNOSIS — C8590 Non-Hodgkin lymphoma, unspecified, unspecified site: Secondary | ICD-10-CM | POA: Diagnosis not present

## 2023-07-23 ENCOUNTER — Ambulatory Visit: Payer: PPO | Admitting: Physical Therapy

## 2023-07-23 ENCOUNTER — Encounter: Payer: Self-pay | Admitting: Physical Therapy

## 2023-07-23 DIAGNOSIS — M6281 Muscle weakness (generalized): Secondary | ICD-10-CM | POA: Diagnosis not present

## 2023-07-23 DIAGNOSIS — L599 Disorder of the skin and subcutaneous tissue related to radiation, unspecified: Secondary | ICD-10-CM

## 2023-07-23 DIAGNOSIS — R262 Difficulty in walking, not elsewhere classified: Secondary | ICD-10-CM

## 2023-07-23 DIAGNOSIS — I890 Lymphedema, not elsewhere classified: Secondary | ICD-10-CM

## 2023-07-23 DIAGNOSIS — C8448 Peripheral T-cell lymphoma, not classified, lymph nodes of multiple sites: Secondary | ICD-10-CM

## 2023-07-23 NOTE — Therapy (Signed)
OUTPATIENT PHYSICAL THERAPY HEAD AND NECK BASELINE TREATMENT   Patient Name: Cynthia Velasquez MRN: [id number] DOB:[birth year], 72 y.o., female Today's Date: 07/23/2023  END OF SESSION:  PT End of Session - 07/23/23 1108     Visit Number 6    Number of Visits 12    Date for PT Re-Evaluation 08/11/23    PT Start Time 1108    PT Stop Time 1136    PT Time Calculation (min) 28 min    Activity Tolerance Patient limited by fatigue    Behavior During Therapy [facility] for tasks assessed/performed             Past Medical History:  Diagnosis Date   Anal cancer (HCC) dx'd 05/2019   Anxiety    Arthritis    Bilateral breast cancer (HCC) 10/21/2013   Breast cancer (HCC)    Cancer (HCC)    Depression    Gallstones    GERD (gastroesophageal reflux disease)    doesn't take any meds for this   Headache    h/o migraines       History of bladder infections    History of hiatal hernia    History of migraine    Hyperlipidemia    takes Simvastatin daily   Hypertension    takes Hyzaar   Joint pain    Joint swelling    Lumbar stenosis    Neuropathy 09/07/2013   Pneumonia    Pre-diabetes    Past Surgical History:  Procedure Laterality Date   ABDOMINAL HYSTERECTOMY     bone spur removed from left foot     CHOLECYSTECTOMY     COLONOSCOPY     COLONOSCOPY WITH PROPOFOL Bilateral 12/07/2021   Procedure: COLONOSCOPY WITH PROPOFOL;  Surgeon: Vida Rigger, MD;  Location: [facility];  Service: Endoscopy;  Laterality: Bilateral;   double mastectomy      ESOPHAGOGASTRODUODENOSCOPY N/A 12/07/2021   Procedure: ESOPHAGOGASTRODUODENOSCOPY (EGD);  Surgeon: Vida Rigger, MD;  Location: [facility];  Service: Endoscopy;  Laterality: N/A;   HERNIA REPAIR     umbilical   IR IMAGING GUIDED PORT INSERTION  06/25/2022   right knee arthroscopy     right shoulder arthroscopy     surgery for hiatal hernia     TONSILLECTOMY     TOTAL HIP ARTHROPLASTY Left 02/12/2013   TOTAL HIP ARTHROPLASTY Left 02/12/2013    Procedure: LEFT TOTAL HIP ARTHROPLASTY;  Surgeon: Nestor Lewandowsky, MD;  Location: [facility] OR;  Service: Orthopedics;  Laterality: Left;   TOTAL HIP ARTHROPLASTY Right 05/03/2013   Procedure: TOTAL HIP ARTHROPLASTY;  Surgeon: Nestor Lewandowsky, MD;  Location: [facility] OR;  Service: Orthopedics;  Laterality: Right;   TOTAL KNEE ARTHROPLASTY Right 02/27/2015   Procedure: TOTAL KNEE ARTHROPLASTY;  Surgeon: Gean Birchwood, MD;  Location: [facility] OR;  Service: Orthopedics;  Laterality: Right;   TOTAL SHOULDER ARTHROPLASTY Right 09/12/2016   Procedure: RIGHT TOTAL SHOULDER ARTHROPLASTY;  Surgeon: Jones Broom, MD;  Location: [facility] OR;  Service: Orthopedics;  Laterality: Right;  RIGHT TOTAL SHOULDER ARTHROPLASTY   Patient Active Problem List   Diagnosis Date Noted   Syncope and collapse 12/29/2022   Hypomagnesemia 12/29/2022   Perianal pain 11/05/2022   Vaginal candidiasis 11/04/2022   Hypokalemia 10/28/2022   Pancytopenia (HCC) 10/28/2022   Port-A-Cath in place 10/01/2022   Chemotherapy-induced neuropathy (HCC) 09/03/2022   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   Incontinence of feces 09/28/2020   Essential  hypertension 09/27/2020   GERD without esophagitis 09/27/2020   History of bilateral mastectomy 09/27/2020   Hyperglycemia 09/27/2020   Moderate recurrent major depression (HCC) 09/27/2020   Pure hypercholesterolemia 09/27/2020   Anal cancer (HCC) 06/13/2019   Status post total shoulder arthroplasty, right 09/12/2016   Arthritis of knee 02/27/2015   Primary osteoarthritis of right knee Valgus 02/24/2015   Neuropathy 09/07/2013   Breast cancer of upper-outer quadrant of left female breast (HCC) 08/09/2013   Breast cancer of upper-outer quadrant of right female breast (HCC) 08/09/2013   Osteoarthritis of right hip 05/03/2013   Osteoarthritis of left hip 02/15/2013    PCP: Dr. 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 I really hurt after last session. My pain level was about a 5/10. By the evening time I felt better.   PERTINENT HISTORY:  Bil breast cancer in 2008 with lumpectomy and SLNB, then bil mastectomy, Completed chemo and radiation. Nodes removed bil axilla - pt unsure of how many (surgery was in [place])  Then Anal cancer in 2020 with chemotherapy and radiation. Diagnosis of T cell lymphoma in 2023. on chemotherapy and just started palliative radiation to bilateral neck will complete on 06/16/23.   PATIENT GOALS:   to be educated about the signs and symptoms of lymphedema and learn post op HEP.   PAIN:  Are you having pain? No  PRECAUTIONS: Active CA and Comment (R shoulder replacement, R knee replacement, R THA, L THA)  RED FLAGS: Bowel or bladder incontinence: Yes: hx of anal cancer    WEIGHT BEARING RESTRICTIONS: No  FALLS:  Has patient fallen in last 6 months? No Does the patient have a fear of falling that limits activity? 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  LEISURE: walks 2-3x/wk for  about 15 min  PRIOR LEVEL OF FUNCTION: Independent   OBJECTIVE:  COGNITION: Overall cognitive status: Within functional limits for tasks assessed                  POSTURE:  Forward head and rounded shoulders posture  30 SEC SIT TO STAND:  Unable to stand from a chair without use of UEs  SHOULDER AROM:    L WFL, R limited by about 60% and reports it has been this way since shoulder replacement   CERVICAL AROM:   Percent limited 07/14/23  Flexion 50% limited and feels swelling is limiting motion WFL  Extension 25% limited 25% limited  Right lateral flexion 25% limited 25% limited  Left lateral flexion [facility] WFL  Right rotation 25% limited WFL  Left rotation 25% limited WFL    (Blank rows=not tested)  LYMPHEDEMA ASSESSMENT:  Circumference in cm 07/14/23  4 cm superior to sternal notch around neck 39.9 37.5  6 cm superior to sternal notch around neck 39 36.5  8 cm superior to sternal notch around neck 39.4 36.9  R lateral nostril from base of nose to medial tragus 11.9 12  L lateral nostril from base of nose to medial tragus 12.2 12.2  R corner of mouth to where ear lobe meets face 10.5 10.5  L corner of mouth to where ear lobe meets face 10 11          (Blank rows=not tested)  GAIT: Assessed: Yes Assistance needed: Modified Independent Ambulation Distance: 20 feet Assistive Device: none Gait pattern: partial step through, shortened step length, decreased foot clearance, decreased arm swing bilaterally Ambulation surface: Level  LOWER EXTREMITY STRENGTH:  MMT Right eval  Hip flexion 3/5  Hip extension 2+/5  Hip abduction 3/5  Hip adduction   Hip internal rotation   Hip external rotation   Knee flexion 3+/5  Knee extension 4/5  Ankle dorsiflexion 5/5  Ankle plantarflexion   Ankle inversion   Ankle eversion    (Blank rows = not tested)  MMT LEFT eval  Hip flexion 4/5  Hip extension 2/5  Hip abduction 2+/5  Hip adduction   Hip internal rotation    Hip external rotation   Knee flexion 4/5  Knee extension 5/5  Ankle dorsiflexion 5/5  Ankle plantarflexion   Ankle inversion   Ankle eversion    (Blank rows = not tested)  BALANCE: 07/14/23: 13 sec on L    2 sec on R  TREATMENT PERFORMED: 07/23/23:  Seated hamstring stretch x 2 reps x 30 sec holds with pt returning therapist demo Seated piriformis stretch x 2 reps with 30 sec holds with pt returning therapist demo Seated TKE with 2lb ankle weights with 5 sec holds x 10 reps each with pt returning therapist demo Seated marching with 2 lb ankle weights x 10 reps bilaterally, with increased clearance on the L compared to R Seated hip abduction with red theraband x 10 reps with 3 sec holds and v/c for slow movement throughout  07/21/23:  Seated TKE with 2lb ankle weights with 5 sec holds x 10 reps each with pt returning therapist demo Seated marching with 2 lb ankle weights x 1 reps bilaterally, was too much resistance so removed ankle weights Seated hip abduction with red theraband x 10 reps with 3 sec holds and v/c for slow movement throughout Nustep - seat at 9, no UEs, level 3 x 10 min 23 sec 571 steps\ 3 way hip machine on 25 lbs x 10 reps each with pt feeling challenged, pt returned therapist demo Seated hamstring stretch x 2 reps x 30 sec holds with pt returning therapist demo Seated piriformis stretch x 2 reps with 30 sec holds with pt returning therapist demo  07/14/23- Reassess  07/10/23: See assessment  07/02/23: Instructed pt using Norton anterior approach handout as follows: short neck, 5 diaphragmatic breaths, bilateral axillary nodes, bilateral pectoral nodes, anterior chest, short neck, posterior neck moving fluid towards pathway aimed at lateral neck, then lateral and anterior neck moving fluid towards pathway aimed at lateral neck then retracing all steps. Had pt return demonstrate each step and provided v/c and t/c for correct skin stretch technique and sequence. Pt  required mod cueing for proximal steps but required less cueing during steps on the neck. Pt completed first half of the sequence with therapist v/c and t/c  and then therapist completed 2nd half of session.   06/30/23: Instructed pt using Norton anterior approach handout as follows: short neck, 5 diaphragmatic breaths, bilateral axillary nodes, bilateral pectoral nodes, anterior chest, short neck, posterior neck moving fluid towards pathway aimed at lateral neck, then lateral and anterior neck moving fluid towards pathway aimed at lateral neck then retracing all steps. Had pt return demonstrate each step and provided v/c and t/c for correct skin stretch tehcnique and sequence. Issued handout for pt to practice    06/12/23: Created foam chip pack in juzo cotton stockinette for pt to wear around head and neck for compression to decrease lymphedema  PATIENT EDUCATION:  Education details: how to obtain a head and neck compression garment for long term use, sleeve for prophylactic use Person educated: Patient Education method: Explanation, Handout Education comprehension: Patient verbalized understanding  HOME EXERCISE PROGRAM: Self MLD for head and neck Wear compression garment at least 4 hours a day  ASSESSMENT:  CLINICAL IMPRESSION: Pt reports she was very fatigued and has muscle soreness that last a few hours after last session. Today continued with the same exercises and stretches as last session but did add weights to seated marches. Ended session before doing NuStep and 3 way hip machine due to pt fatigue. Pt was willing to push through fatigue but did report she would have to lie down the rest of the day if she continued. Educated pt about the importance of finding the balance with exercise so she is not completely wiped out for the rest of the day. Will assess how she tolerates today's session next visit.   Pt will benefit from skilled therapeutic intervention to improve on the following  deficits: Decreased knowledge of precautions and postural dysfunction. Other deficits: decreased knowledge of condition, decreased knowledge of use of DME, difficulty walking, decreased strength, increased edema, increased fascial restrictions, postural dysfunction, and pain  PT treatment/interventions: ADL/self-care home management, pt/family education, therapeutic exercise. Other interventions 97164- PT Re-evaluation, 97110-Therapeutic exercises, 97530- Therapeutic activity, [id number]- Neuromuscular re-education, 97535- Self Care, 13086- Manual therapy, 97760- Orthotic Fit/training, 97016- Vasopneumatic device, Patient/Family education, Taping, Joint mobilization, Manual lymph drainage, and Compression bandaging  REHAB POTENTIAL: Good  CLINICAL DECISION MAKING: Stable/uncomplicated  EVALUATION COMPLEXITY: Low   GOALS: Goals reviewed with patient? YES  LONG TERM GOALS: (STG=LTG)   Name Target Date  Goal status  1 Patient will be able to verbalize understanding of a home exercise program for cervical range of motion, posture, and walking.   Baseline:  No knowledge 06/12/2023 Achieved at eval  2 Patient will be able to verbalize understanding of proper sitting and standing posture. Baseline:  No knowledge 06/12/2023 Achieved at eval  3 Patient will be able to verbalize understanding of lymphedema risk and availability of treatment for this condition Baseline:  No knowledge 06/12/2023 Achieved at eval  4 Pt will be independent in self MLD for long term management of lymphedema. Baseline: See objective measurements taken today. 07/10/23 ONGOING 07/14/23  5 Pt will obtain an appropriate compression garmetn for long term management of lymphedema. 07/10/23 IN PROGRESS 07/14/23 - pt plans to purchase online  6 Pt will demonstrate a 0.5 cm decrease in edema from R corner of mouth to where ear lobe meets face. 07/10/23 DEFERRED - pt does not feel like her face is swelling but her neck swelling has  decreased 07/14/23  7 Pt will be able to complete SLS for 30 sec bilaterally to decrease fall risk. 08/11/23 NEW   8  Pt will demonstrate 4/5 bilateral hip flexion strength to decrease fall risk. 08/11/23 NEW   9 Pt will demonstrate 4/5 bilateral hip abductor strength to decrease fall risk. 08/11/23 NEW  10  Pt will be independent in a home exercise program for continued stretching and strengthening. 08/11/23 NEW     PLAN:  PT FREQUENCY/DURATION: 2x/wk for 4 wks  PLAN FOR NEXT SESSION: how was pain and fatigue after cutting back this visit? continue to instruct in head and neck norton approach, cont LE strength and balance   Physical Therapy Information for During and After Head/Neck Cancer Treatment: Lymphedema is a swelling condition that you may be at risk for in your neck and/or face if you have radiation treatment to the area and/or if you have surgery that includes removing lymph nodes.  There is treatment available for this condition and it is not life-threatening.  Contact your physician or physical therapist with concerns. An excellent resource for those seeking information on lymphedema is the National Lymphedema Network's website.  It can be accessed at www.lymphnet.org If you notice swelling in your neck or face at any time following surgery (even if it is many years from now), please contact your doctor or physical therapist to discuss this.  Lymphedema can be treated at any time but it is easier for you if it is treated early on. If you have had surgery to your neck, please check with your surgeon about how soon to start doing neck range of motion exercises.  If you are not having surgery, I encourage you to start doing neck range of motion exercises today and continue these while undergoing treatment, UNLESS you have irritation of your skin or soft tissue that is aggravated by doing them.  These exercises are intended to help you prevent loss of range of motion and/or to gain range  of motion in your neck (which can be limited by tightening effects of radiation), and NOT to aggravate these tissues if they develop sensitivities from treatment. Neck range of motion exercises should be done to the point of feeling a GENTLE, TOLERABLE stretch only.  You are encouraged to start a walking or other exercise program tomorrow and continue this as much as you are able through and after treatment.  Please feel free to call me with any questions. Leonette Most, PT, CLT Physical Therapist and Certified Lymphedema Therapist [facility] [address], [address], [place], [place] [address] [phone] [email or web address]  WALKING  Walking is a great form of exercise to increase your strength, endurance and overall fitness.  A walking program can help you start slowly and gradually build endurance as you go.  Everyone's ability is different, so each person's starting point will be different.  You do not have to follow them exactly.  The are just samples. You should simply find out what's right for you and stick to that program.   In the beginning, you'll start off walking 2-3 times a day for short distances.  As you get stronger, you'll be walking further at just 1-2 times per day.  A. You Can Walk For A Certain Length Of Time Each Day    Walk 5 minutes 3 times per day.  Increase 2 minutes every 2 days (3 times per day).  Work up to 25-30 minutes (1-2 times per day).   Example:   Day 1-2 5 minutes 3 times per day   Day 7-8 12 minutes 2-3 times per day   Day 13-14  25 minutes 1-2 times per day  B. You Can Walk For a Certain Distance Each Day     Distance can be substituted for time.    Example:   3 trips to mailbox (at road)   3 trips to corner of block   3 trips around the block  C. Go to local high school and use the track.    Walk for distance ____ around track  Or time ____ minutes  D. Walk ____ Jog ____ Run ___   Why  exercise?  So many benefits! Here are SOME of them: Heart health, including raising your good cholesterol level and reducing heart rate and blood pressure Lung health, including improved lung capacity It burns fats, and most of [name] can stand to be leaner, whether or not we are overweight. It increases the body's natural painkillers and mood elevators, so makes you feel better. Not only makes you feel better, but look better too Improves sleep Takes a bite out of stress May decrease your risk of many types of cancer If you are currently undergoing cancer treatment, exercise may improve your ability to tolerate treatments including chemotherapy. For everybody, it can improve your energy level. Those with cancer-related fatigue report a 40-50% reduction in this symptom when exercising regularly. If you are a survivor of breast, colon, or prostate cancer, it may decrease your risk of a recurrence. (This may hold for other cancers too, but so far we have data just for these three types.)  How to exercise: Get your doctor's okay. Pick something you enjoy doing, like walking, Zumba, biking, swimming, or whatever. Start at low intensity and time, then gradually increase.  (See walking program handout.) Set a goal to achieve over time.  The American Cancer Society, American Heart Association, and U.S. Dept. of Health and Human Services recommend 150 minutes of moderate exercise, 75 minutes of vigorous exercise, or a combination of both per week. This should be done in episodes at least 10 minutes long, spread throughout the week.  Need help being motivated? Pick something you enjoy doing, because you'll be more inclined to stick with that activity than something that feels like a chore. Do it with a friend so that you are accountable to each other. Schedule it into your day. Place it on your calendar and keep that appointment just like you do any appointment that you make. Join an exercise group that  meets at a specific time.  That way, you have to show up on time, and that makes it harder to procrastinate about doing your workout.  It also keeps you accountable--people begin to expect you to be there. Join a gym where you feel comfortable and not intimidated, at the right cost. Sign up for something that you'll need to be in shape for on a specific date, like a 1K or a 5K to walk or run, a 20 or 30 mile bike ride, a mud run or something like that. If the date is looming, you know you'll need to train to be ready for it.  An added benefit is that many of these are fundraisers for good causes. If you've already paid for a gym membership, group exercise class or event, you might as well work out, so you haven't wasted your money!    [employer], PT 07/23/2023, 11:39 AM

## 2023-07-28 ENCOUNTER — Encounter: Payer: Self-pay | Admitting: Hematology

## 2023-07-28 DIAGNOSIS — H8113 Benign paroxysmal vertigo, bilateral: Secondary | ICD-10-CM | POA: Diagnosis not present

## 2023-07-29 ENCOUNTER — Ambulatory Visit: Payer: PPO | Admitting: Physical Therapy

## 2023-07-30 ENCOUNTER — Ambulatory Visit: Payer: PPO | Admitting: Rehabilitation

## 2023-07-30 ENCOUNTER — Telehealth: Payer: Self-pay | Admitting: Rehabilitation

## 2023-07-30 NOTE — Telephone Encounter (Signed)
We missed you today at your physical therapy appointment.  Just a reminder that your next appointment is: 08/05/23 at 11am  If you will not be attending future appointments please give [name] a call at [phone] so we can get them cancelled.   If you no-show for a 2nd time your remaining appointments will be cancelled and you will have to schedule them 1 at a time for the remainder of your plan of care.    A 3rd no show will result in automatic discharge with a new referral needed.

## 2023-07-31 ENCOUNTER — Encounter (HOSPITAL_COMMUNITY): Payer: Self-pay | Admitting: Emergency Medicine

## 2023-07-31 ENCOUNTER — Inpatient Hospital Stay (HOSPITAL_COMMUNITY)
Admission: EM | Admit: 2023-07-31 | Discharge: 2023-08-03 | DRG: 841 | Disposition: A | Discharge status: Home / Self Care | Payer: PPO | Attending: Internal Medicine | Admitting: Internal Medicine

## 2023-07-31 ENCOUNTER — Emergency Department (HOSPITAL_COMMUNITY): Payer: PPO

## 2023-07-31 ENCOUNTER — Other Ambulatory Visit: Payer: Self-pay

## 2023-07-31 DIAGNOSIS — C8590 Non-Hodgkin lymphoma, unspecified, unspecified site: Secondary | ICD-10-CM | POA: Diagnosis present

## 2023-07-31 DIAGNOSIS — Z96611 Presence of right artificial shoulder joint: Secondary | ICD-10-CM | POA: Diagnosis present

## 2023-07-31 DIAGNOSIS — K831 Obstruction of bile duct: Secondary | ICD-10-CM | POA: Diagnosis not present

## 2023-07-31 DIAGNOSIS — Z885 Allergy status to narcotic agent status: Secondary | ICD-10-CM

## 2023-07-31 DIAGNOSIS — Z9221 Personal history of antineoplastic chemotherapy: Secondary | ICD-10-CM

## 2023-07-31 DIAGNOSIS — Z9049 Acquired absence of other specified parts of digestive tract: Secondary | ICD-10-CM

## 2023-07-31 DIAGNOSIS — R935 Abnormal findings on diagnostic imaging of other abdominal regions, including retroperitoneum: Secondary | ICD-10-CM | POA: Diagnosis not present

## 2023-07-31 DIAGNOSIS — K5909 Other constipation: Secondary | ICD-10-CM | POA: Diagnosis not present

## 2023-07-31 DIAGNOSIS — R1084 Generalized abdominal pain: Secondary | ICD-10-CM | POA: Diagnosis not present

## 2023-07-31 DIAGNOSIS — Z87440 Personal history of urinary (tract) infections: Secondary | ICD-10-CM

## 2023-07-31 DIAGNOSIS — C8440 Peripheral T-cell lymphoma, not classified, unspecified site: Secondary | ICD-10-CM | POA: Diagnosis not present

## 2023-07-31 DIAGNOSIS — Z923 Personal history of irradiation: Secondary | ICD-10-CM

## 2023-07-31 DIAGNOSIS — Z79899 Other long term (current) drug therapy: Secondary | ICD-10-CM

## 2023-07-31 DIAGNOSIS — R590 Localized enlarged lymph nodes: Secondary | ICD-10-CM | POA: Diagnosis not present

## 2023-07-31 DIAGNOSIS — G893 Neoplasm related pain (acute) (chronic): Secondary | ICD-10-CM | POA: Diagnosis present

## 2023-07-31 DIAGNOSIS — D696 Thrombocytopenia, unspecified: Secondary | ICD-10-CM | POA: Diagnosis present

## 2023-07-31 DIAGNOSIS — C8448 Peripheral T-cell lymphoma, not classified, lymph nodes of multiple sites: Secondary | ICD-10-CM

## 2023-07-31 DIAGNOSIS — Z90710 Acquired absence of both cervix and uterus: Secondary | ICD-10-CM

## 2023-07-31 DIAGNOSIS — Z85048 Personal history of other malignant neoplasm of rectum, rectosigmoid junction, and anus: Secondary | ICD-10-CM

## 2023-07-31 DIAGNOSIS — Z96651 Presence of right artificial knee joint: Secondary | ICD-10-CM | POA: Diagnosis present

## 2023-07-31 DIAGNOSIS — Z888 Allergy status to other drugs, medicaments and biological substances status: Secondary | ICD-10-CM

## 2023-07-31 DIAGNOSIS — R591 Generalized enlarged lymph nodes: Secondary | ICD-10-CM | POA: Diagnosis present

## 2023-07-31 DIAGNOSIS — R7303 Prediabetes: Secondary | ICD-10-CM | POA: Diagnosis present

## 2023-07-31 DIAGNOSIS — N179 Acute kidney failure, unspecified: Secondary | ICD-10-CM | POA: Diagnosis present

## 2023-07-31 DIAGNOSIS — F411 Generalized anxiety disorder: Secondary | ICD-10-CM | POA: Diagnosis present

## 2023-07-31 DIAGNOSIS — I10 Essential (primary) hypertension: Secondary | ICD-10-CM | POA: Diagnosis present

## 2023-07-31 DIAGNOSIS — R160 Hepatomegaly, not elsewhere classified: Secondary | ICD-10-CM | POA: Diagnosis not present

## 2023-07-31 DIAGNOSIS — M6281 Muscle weakness (generalized): Secondary | ICD-10-CM | POA: Diagnosis not present

## 2023-07-31 DIAGNOSIS — Z853 Personal history of malignant neoplasm of breast: Secondary | ICD-10-CM

## 2023-07-31 DIAGNOSIS — E861 Hypovolemia: Secondary | ICD-10-CM | POA: Diagnosis present

## 2023-07-31 DIAGNOSIS — R7401 Elevation of levels of liver transaminase levels: Secondary | ICD-10-CM | POA: Diagnosis present

## 2023-07-31 DIAGNOSIS — E876 Hypokalemia: Secondary | ICD-10-CM | POA: Diagnosis present

## 2023-07-31 DIAGNOSIS — G629 Polyneuropathy, unspecified: Secondary | ICD-10-CM | POA: Diagnosis present

## 2023-07-31 DIAGNOSIS — K5900 Constipation, unspecified: Secondary | ICD-10-CM | POA: Diagnosis present

## 2023-07-31 DIAGNOSIS — R7989 Other specified abnormal findings of blood chemistry: Secondary | ICD-10-CM | POA: Diagnosis present

## 2023-07-31 DIAGNOSIS — F32A Depression, unspecified: Secondary | ICD-10-CM | POA: Diagnosis present

## 2023-07-31 DIAGNOSIS — E785 Hyperlipidemia, unspecified: Secondary | ICD-10-CM | POA: Diagnosis present

## 2023-07-31 DIAGNOSIS — Z9013 Acquired absence of bilateral breasts and nipples: Secondary | ICD-10-CM

## 2023-07-31 DIAGNOSIS — Z96643 Presence of artificial hip joint, bilateral: Secondary | ICD-10-CM | POA: Diagnosis present

## 2023-07-31 HISTORY — DX: Non-Hodgkin lymphoma, unspecified, unspecified site: C85.90

## 2023-07-31 LAB — COMPREHENSIVE METABOLIC PANEL
ALT: 135 U/L — ABNORMAL HIGH (ref 0–44)
AST: 298 U/L — ABNORMAL HIGH (ref 15–41)
Albumin: 3.2 g/dL — ABNORMAL LOW (ref 3.5–5.0)
Alkaline Phosphatase: 307 U/L — ABNORMAL HIGH (ref 38–126)
Anion gap: 12 (ref 5–15)
BUN: 22 mg/dL (ref 8–23)
CO2: 19 mmol/L — ABNORMAL LOW (ref 22–32)
Calcium: 9.6 mg/dL (ref 8.9–10.3)
Chloride: 109 mmol/L (ref 98–111)
Creatinine, Ser: 1.71 mg/dL — ABNORMAL HIGH (ref 0.44–1.00)
GFR, Estimated: 32 mL/min — ABNORMAL LOW (ref >60)
Glucose, Bld: 88 mg/dL (ref 70–99)
Potassium: 3.1 mmol/L — ABNORMAL LOW (ref 3.5–5.1)
Sodium: 140 mmol/L (ref 135–145)
Total Bilirubin: 2 mg/dL — ABNORMAL HIGH (ref <1.2)
Total Protein: 5.8 g/dL — ABNORMAL LOW (ref 6.5–8.1)

## 2023-07-31 LAB — CBC
HCT: 33.3 % — ABNORMAL LOW (ref 36.0–46.0)
Hemoglobin: 10.8 g/dL — ABNORMAL LOW (ref 12.0–15.0)
MCH: 27.8 pg (ref 26.0–34.0)
MCHC: 32.4 g/dL (ref 30.0–36.0)
MCV: 85.8 fL (ref 80.0–100.0)
Platelets: 131 10*3/uL — ABNORMAL LOW (ref 150–400)
RBC: 3.88 MIL/uL (ref 3.87–5.11)
RDW: 21.4 % — ABNORMAL HIGH (ref 11.5–15.5)
WBC: 4.8 10*3/uL (ref 4.0–10.5)
nRBC: 0 % (ref 0.0–0.2)

## 2023-07-31 LAB — LIPASE, BLOOD: Lipase: 24 U/L (ref 11–51)

## 2023-07-31 MED ORDER — SODIUM CHLORIDE 0.9% FLUSH
10.0000 mL | Freq: Two times a day (BID) | INTRAVENOUS | Status: DC
  Administered 2023-08-01 – 2023-08-03 (×5): 10 mL

## 2023-07-31 MED ORDER — SODIUM CHLORIDE 0.9% FLUSH
10.0000 mL | INTRAVENOUS | Status: DC | PRN
  Administered 2023-08-03: 10 mL

## 2023-07-31 MED ORDER — IOHEXOL 300 MG/ML  SOLN
80.0000 mL | Freq: Once | INTRAMUSCULAR | Status: AC | PRN
  Administered 2023-07-31: 80 mL via INTRAVENOUS

## 2023-07-31 MED ORDER — CHLORHEXIDINE GLUCONATE CLOTH 2 % EX PADS
6.0000 | MEDICATED_PAD | Freq: Every day | CUTANEOUS | Status: DC
  Administered 2023-08-01 – 2023-08-03 (×3): 6 via TOPICAL

## 2023-07-31 NOTE — ED Notes (Signed)
Pt attempted to urinate in a hat in the commode but missed the hat. Will try again later

## 2023-07-31 NOTE — ED Provider Notes (Signed)
[place] EMERGENCY DEPARTMENT AT [facility] Provider Note   CSN: [id number] Arrival date & time: 07/31/23  1904   History  Chief Complaint  Patient presents with   Abdominal Pain   Abnormal Labs   Cynthia Velasquez is a 72 y.o. female.  72 year old female presenting from PCP office with concerns of abdominal pain x 2 weeks.  Additionally, PCP advised her to be evaluated for AKI and elevated liver labs.  Patient endorses abdominal pain x 2 weeks and has not had bowel movement in that time.  She has used MiraLAX, Colace, enema, suppository which did not prove beneficial.  She endorses distention of her abdomen and some abdominal pain when she attempts to take deep breaths then.  She denies nausea/vomiting, dysuria.  She has never had constipation difficulty in the past.  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  She has pertinent surgical history of hysterectomy, oophorectomy, hiatal hernia repair, cholecystectomy.   Abdominal Pain    Home Medications Prior to Admission medications   Medication Sig Start Date End Date Taking? Authorizing Provider  aspirin-acetaminophen-caffeine (EXCEDRIN MIGRAINE) [phone] MG tablet Take 2 tablets by mouth every 6 (six) hours as needed for migraine.    [provider]  DULoxetine (CYMBALTA) 60 MG capsule Take 1 capsule (60 mg total) by mouth 2 (two) times daily. Take 1 capsule (30 mg) at bedtime for 1 week, if no side effects, then increase to 2 capsules (60 mg) thereafter. 12/20/22   Antony Madura, MD  fluconazole (DIFLUCAN) 200 MG tablet Take 1 tablet (200 mg total) by mouth daily. 06/27/23   [name], MD  fluconazole (DIFLUCAN) 200 MG tablet Take 1 tablet (200 mg total) by mouth daily. 06/27/23   [name], MD  losartan (COZAAR) 100 MG tablet Take 100 mg by mouth daily. 01/14/17   [provider]  magic mouthwash  (multi-ingredient) oral suspension Take 5 mLs by mouth 3 (three) times daily as needed. 06/27/23   [name], MD  magic mouthwash w/lidocaine SOLN Take 5 mLs by mouth 3 (three) times daily as needed for mouth pain. Suspension contains total volume 80 ml of distilled water 80ml of maalox 80ml of 2% viscous lidocaine 80ml of nystatin at 500000 units per 5 ml 80ml of diphenhydramine at 12.5mg /67ml 06/27/23   [name], MD  Menthol-Methyl Salicylate (SALONPAS PAIN RELIEF PATCH EX) Apply 1 patch topically daily as needed (pain).    [provider]  methocarbamol (ROBAXIN) 750 MG tablet Take 1 tablet (750 mg total) by mouth every 8 (eight) hours as needed for muscle spasms. 01/08/23   [name], MD  naloxone [facility]) nasal spray 4 mg/0.1 mL 1 squirt in the naris for unresponsiveness 06/19/23   Melene Plan, DO  Nystatin (GERHARDT'S BUTT CREAM) CREA Apply 1 Application topically 2 (two) times daily. Patient taking differently: Apply 1 Application topically 2 (two) times daily as needed for irritation. 11/05/22   Drema Dallas, MD  nystatin (MYCOSTATIN/NYSTOP) powder Apply 1 Application topically 3 (three) times daily. Patient taking differently: Apply 1 Application topically 3 (three) times daily as needed (redness, rash). 08/30/22   Walisiewicz, Kaitlyn E, PA-C  ondansetron (ZOFRAN) 8 MG tablet Take 1 tablet (8 mg total) by mouth every 8 (eight) hours as needed for nausea or vomiting. 02/07/23   [name], MD  pantoprazole (PROTONIX) 40 MG tablet Take 1 tablet (  40 mg total) by mouth daily. Patient taking differently: Take 40 mg by mouth daily as needed (for acid reflux). 11/06/22   Drema Dallas, MD  potassium chloride SA (KLOR-CON M) 20 MEQ tablet Take 1 tablet (20 mEq total) by mouth 2 (two) times daily. 06/27/23   [name], MD  prochlorperazine (COMPAZINE) 10 MG tablet Take 1 tablet (10 mg total) by mouth every 6 (six) hours as needed  for nausea or vomiting. 02/07/23   [name], MD  sucralfate (CARAFATE) 1 g tablet Take 1 tablet (1 g total) by mouth 2 (two) times daily. Dissolve tab in 5-10 ml of water and then swallow the slurry. 06/27/23   [name], MD  sucralfate (CARAFATE) 1 g tablet Dissolve 1 tablet in 5-10ml of water twice a day and then swallow slurry. 06/27/23   [name], MD  triamcinolone ointment (KENALOG) 0.5 % Apply 1 Application topically 2 (two) times daily. To rash under both armpits 05/20/23   [name], MD      Allergies    Tramadol, Belinostat, Codeine, and Vicodin [hydrocodone-acetaminophen]    Review of Systems   Review of Systems  Gastrointestinal:  Positive for abdominal pain.    Physical Exam Updated Vital Signs BP (!) 133/94   Pulse 87   Temp 98.3 F (36.8 C) (Oral)   Resp (!) 25   Ht 5\' 5"  (1.651 m)   Wt 84.8 kg   SpO2 98%   BMI 31.12 kg/m  Physical Exam Constitutional:      Appearance: She is well-developed.  Cardiovascular:     Rate and Rhythm: Normal rate and regular rhythm.     Heart sounds: Normal heart sounds.  Pulmonary:     Effort: Pulmonary effort is normal.  Abdominal:     General: Bowel sounds are increased. There is distension.     Tenderness: There is generalized abdominal tenderness. There is guarding.  Genitourinary:    Rectum: Normal. No mass.  Neurological:     Mental Status: She is alert.    ED Results / Procedures / Treatments   Labs (all labs ordered are listed, but only abnormal results are displayed) Labs Reviewed  CBC - Abnormal; Notable for the following components:      Result Value   Hemoglobin 10.8 (*)    HCT 33.3 (*)    RDW 21.4 (*)    Platelets 131 (*)    All other components within normal limits  LIPASE, BLOOD  COMPREHENSIVE METABOLIC PANEL  URINALYSIS, ROUTINE W REFLEX MICROSCOPIC   EKG None  Radiology No results found.  Procedures Procedures    Medications Ordered in  ED Medications  sodium chloride flush (NS) 0.9 % injection 10-40 mL ( Intracatheter Not Given 07/31/23 2202)  sodium chloride flush (NS) 0.9 % injection 10-40 mL (has no administration in time range)  Chlorhexidine Gluconate Cloth 2 % PADS 6 each (6 each Topical Not Given 07/31/23 2202)    ED Course/ Medical Decision Making/ A&P                                 Medical Decision Making 72 year old female presenting with abdominal pain x 2 weeks and lack of bowel movement in that time as well.  She has failed several outpatient medications in the form of MiraLAX, suppository, enema to aid in this.  Additional concern by PCP for AKI and elevated LFTs.  Significant past medical history of bilateral breast cancer, anal cancer, T-cell lymphoma status post chemotherapy.  On physical exam she has guarding of her abdomen and generalized abdominal tenderness.  Rectal exam did not reveal significant stool burden.  Further, she has significant abdominal surgical history.  Differential diagnosis most concerning for SBO secondary to adhesions, secondarily: Malignancy leading to obstructive process.  Further will consider AKI and elevated LFTs given this was concern from PCP.  Further may consider pancreatitis although this is unlikely.  Awaiting CMP, CBC, lipase, CT abdomen/pelvis with contrast.  Signed out to oncoming medical provider.  Amount and/or Complexity of Data Reviewed Labs: ordered.         Final Clinical Impression(s) / ED Diagnoses Final diagnoses:  None   Rx / DC Orders ED Discharge Orders     None        Shelby Mattocks, DO 07/31/23 2219    Maia Plan, MD 08/07/23 1455

## 2023-07-31 NOTE — ED Triage Notes (Signed)
Patient complaining of abdominal pain for 2 weeks. Endorses nausea and decreased PO intake. Patient seen at PCP for same and advised to come in to ED for eval of AKI.

## 2023-08-01 ENCOUNTER — Encounter (HOSPITAL_COMMUNITY): Payer: Self-pay | Admitting: Internal Medicine

## 2023-08-01 DIAGNOSIS — Z90710 Acquired absence of both cervix and uterus: Secondary | ICD-10-CM | POA: Diagnosis not present

## 2023-08-01 DIAGNOSIS — R932 Abnormal findings on diagnostic imaging of liver and biliary tract: Secondary | ICD-10-CM | POA: Diagnosis not present

## 2023-08-01 DIAGNOSIS — Z923 Personal history of irradiation: Secondary | ICD-10-CM | POA: Diagnosis not present

## 2023-08-01 DIAGNOSIS — E861 Hypovolemia: Secondary | ICD-10-CM | POA: Diagnosis not present

## 2023-08-01 DIAGNOSIS — Z888 Allergy status to other drugs, medicaments and biological substances status: Secondary | ICD-10-CM | POA: Diagnosis not present

## 2023-08-01 DIAGNOSIS — F411 Generalized anxiety disorder: Secondary | ICD-10-CM | POA: Diagnosis not present

## 2023-08-01 DIAGNOSIS — Z79899 Other long term (current) drug therapy: Secondary | ICD-10-CM | POA: Diagnosis not present

## 2023-08-01 DIAGNOSIS — Z853 Personal history of malignant neoplasm of breast: Secondary | ICD-10-CM | POA: Diagnosis not present

## 2023-08-01 DIAGNOSIS — K831 Obstruction of bile duct: Secondary | ICD-10-CM | POA: Diagnosis not present

## 2023-08-01 DIAGNOSIS — E876 Hypokalemia: Secondary | ICD-10-CM

## 2023-08-01 DIAGNOSIS — R7401 Elevation of levels of liver transaminase levels: Secondary | ICD-10-CM

## 2023-08-01 DIAGNOSIS — C8440 Peripheral T-cell lymphoma, not classified, unspecified site: Secondary | ICD-10-CM | POA: Diagnosis not present

## 2023-08-01 DIAGNOSIS — E785 Hyperlipidemia, unspecified: Secondary | ICD-10-CM | POA: Diagnosis not present

## 2023-08-01 DIAGNOSIS — N179 Acute kidney failure, unspecified: Secondary | ICD-10-CM

## 2023-08-01 DIAGNOSIS — R945 Abnormal results of liver function studies: Secondary | ICD-10-CM | POA: Diagnosis not present

## 2023-08-01 DIAGNOSIS — K5900 Constipation, unspecified: Secondary | ICD-10-CM | POA: Diagnosis not present

## 2023-08-01 DIAGNOSIS — F32A Depression, unspecified: Secondary | ICD-10-CM | POA: Diagnosis not present

## 2023-08-01 DIAGNOSIS — Z9049 Acquired absence of other specified parts of digestive tract: Secondary | ICD-10-CM | POA: Diagnosis not present

## 2023-08-01 DIAGNOSIS — D696 Thrombocytopenia, unspecified: Secondary | ICD-10-CM | POA: Diagnosis not present

## 2023-08-01 DIAGNOSIS — R1084 Generalized abdominal pain: Secondary | ICD-10-CM | POA: Diagnosis not present

## 2023-08-01 DIAGNOSIS — Z96643 Presence of artificial hip joint, bilateral: Secondary | ICD-10-CM | POA: Diagnosis not present

## 2023-08-01 DIAGNOSIS — C8590 Non-Hodgkin lymphoma, unspecified, unspecified site: Secondary | ICD-10-CM | POA: Diagnosis not present

## 2023-08-01 DIAGNOSIS — Z85048 Personal history of other malignant neoplasm of rectum, rectosigmoid junction, and anus: Secondary | ICD-10-CM | POA: Diagnosis not present

## 2023-08-01 DIAGNOSIS — G893 Neoplasm related pain (acute) (chronic): Secondary | ICD-10-CM | POA: Diagnosis not present

## 2023-08-01 DIAGNOSIS — R591 Generalized enlarged lymph nodes: Secondary | ICD-10-CM | POA: Diagnosis not present

## 2023-08-01 DIAGNOSIS — Z885 Allergy status to narcotic agent status: Secondary | ICD-10-CM | POA: Diagnosis not present

## 2023-08-01 DIAGNOSIS — Z9013 Acquired absence of bilateral breasts and nipples: Secondary | ICD-10-CM | POA: Diagnosis not present

## 2023-08-01 DIAGNOSIS — I10 Essential (primary) hypertension: Secondary | ICD-10-CM | POA: Diagnosis not present

## 2023-08-01 LAB — CBC WITH DIFFERENTIAL/PLATELET
Abs Immature Granulocytes: 0.06 10*3/uL (ref 0.00–0.07)
Basophils Absolute: 0 10*3/uL (ref 0.0–0.1)
Basophils Relative: 1 %
Eosinophils Absolute: 0.1 10*3/uL (ref 0.0–0.5)
Eosinophils Relative: 2 %
HCT: 32.8 % — ABNORMAL LOW (ref 36.0–46.0)
Hemoglobin: 10.3 g/dL — ABNORMAL LOW (ref 12.0–15.0)
Immature Granulocytes: 1 %
Lymphocytes Relative: 13 %
Lymphs Abs: 0.7 10*3/uL (ref 0.7–4.0)
MCH: 27.8 pg (ref 26.0–34.0)
MCHC: 31.4 g/dL (ref 30.0–36.0)
MCV: 88.6 fL (ref 80.0–100.0)
Monocytes Absolute: 1 10*3/uL (ref 0.1–1.0)
Monocytes Relative: 18 %
Neutro Abs: 3.6 10*3/uL (ref 1.7–7.7)
Neutrophils Relative %: 65 %
Platelets: 137 10*3/uL — ABNORMAL LOW (ref 150–400)
RBC: 3.7 MIL/uL — ABNORMAL LOW (ref 3.87–5.11)
RDW: 21.3 % — ABNORMAL HIGH (ref 11.5–15.5)
WBC: 5.4 10*3/uL (ref 4.0–10.5)
nRBC: 0 % (ref 0.0–0.2)

## 2023-08-01 LAB — COMPREHENSIVE METABOLIC PANEL
ALT: 124 U/L — ABNORMAL HIGH (ref 0–44)
AST: 284 U/L — ABNORMAL HIGH (ref 15–41)
Albumin: 2.9 g/dL — ABNORMAL LOW (ref 3.5–5.0)
Alkaline Phosphatase: 289 U/L — ABNORMAL HIGH (ref 38–126)
Anion gap: 11 (ref 5–15)
BUN: 21 mg/dL (ref 8–23)
CO2: 19 mmol/L — ABNORMAL LOW (ref 22–32)
Calcium: 9.1 mg/dL (ref 8.9–10.3)
Chloride: 110 mmol/L (ref 98–111)
Creatinine, Ser: 1.86 mg/dL — ABNORMAL HIGH (ref 0.44–1.00)
GFR, Estimated: 29 mL/min — ABNORMAL LOW (ref >60)
Glucose, Bld: 81 mg/dL (ref 70–99)
Potassium: 3.1 mmol/L — ABNORMAL LOW (ref 3.5–5.1)
Sodium: 140 mmol/L (ref 135–145)
Total Bilirubin: 2 mg/dL — ABNORMAL HIGH (ref <1.2)
Total Protein: 5.3 g/dL — ABNORMAL LOW (ref 6.5–8.1)

## 2023-08-01 LAB — URINALYSIS, ROUTINE W REFLEX MICROSCOPIC
Bilirubin Urine: NEGATIVE
Glucose, UA: NEGATIVE mg/dL
Ketones, ur: NEGATIVE mg/dL
Nitrite: NEGATIVE
Protein, ur: NEGATIVE mg/dL
Specific Gravity, Urine: 1.02 (ref 1.005–1.030)
pH: 5 (ref 5.0–8.0)

## 2023-08-01 LAB — CREATININE, URINE, RANDOM: Creatinine, Urine: 157 mg/dL

## 2023-08-01 LAB — PROTIME-INR
INR: 1.2 (ref 0.8–1.2)
Prothrombin Time: 14.9 s (ref 11.4–15.2)

## 2023-08-01 LAB — GAMMA GT: GGT: 164 U/L — ABNORMAL HIGH (ref 7–50)

## 2023-08-01 LAB — LACTATE DEHYDROGENASE: LDH: 5933 U/L — ABNORMAL HIGH (ref 98–192)

## 2023-08-01 LAB — SODIUM, URINE, RANDOM: Sodium, Ur: 64 mmol/L

## 2023-08-01 LAB — MAGNESIUM
Magnesium: 1.4 mg/dL — ABNORMAL LOW (ref 1.7–2.4)
Magnesium: 1.5 mg/dL — ABNORMAL LOW (ref 1.7–2.4)

## 2023-08-01 LAB — BILIRUBIN, DIRECT: Bilirubin, Direct: 0.9 mg/dL — ABNORMAL HIGH (ref 0.0–0.2)

## 2023-08-01 MED ORDER — MAGNESIUM SULFATE 2 GM/50ML IV SOLN
2.0000 g | Freq: Once | INTRAVENOUS | Status: AC
  Administered 2023-08-01: 2 g via INTRAVENOUS
  Filled 2023-08-01: qty 50

## 2023-08-01 MED ORDER — ONDANSETRON HCL 4 MG/2ML IJ SOLN: 4.0000 mg | Freq: Four times a day (QID) | INTRAMUSCULAR | Status: DC | PRN

## 2023-08-01 MED ORDER — HYDROMORPHONE HCL 1 MG/ML IJ SOLN
0.5000 mg | INTRAMUSCULAR | Status: DC | PRN
  Administered 2023-08-01: 0.5 mg via INTRAVENOUS
  Filled 2023-08-01: qty 1

## 2023-08-01 MED ORDER — NALOXONE HCL 0.4 MG/ML IJ SOLN: 0.4000 mg | INTRAMUSCULAR | Status: DC | PRN

## 2023-08-01 MED ORDER — POTASSIUM CHLORIDE 10 MEQ/100ML IV SOLN
10.0000 meq | INTRAVENOUS | Status: AC
Start: 2023-08-01 — End: 2023-08-01
  Administered 2023-08-01 (×3): 10 meq via INTRAVENOUS
  Filled 2023-08-01 (×3): qty 100

## 2023-08-01 MED ORDER — LACTATED RINGERS IV SOLN: INTRAVENOUS | Status: DC

## 2023-08-01 MED ORDER — POLYETHYLENE GLYCOL 3350 17 G PO PACK
17.0000 g | PACK | Freq: Every day | ORAL | Status: DC
  Administered 2023-08-01 – 2023-08-02 (×2): 17 g via ORAL
  Filled 2023-08-01 (×2): qty 1

## 2023-08-01 MED ORDER — HYDROMORPHONE HCL 1 MG/ML IJ SOLN: 0.5000 mg | INTRAMUSCULAR | Status: DC | PRN

## 2023-08-01 MED ORDER — METHYLPREDNISOLONE SODIUM SUCC 125 MG IJ SOLR
125.0000 mg | Freq: Two times a day (BID) | INTRAMUSCULAR | Status: DC
  Administered 2023-08-01 – 2023-08-02 (×3): 125 mg via INTRAVENOUS
  Filled 2023-08-01 (×3): qty 2

## 2023-08-01 MED ORDER — POTASSIUM CHLORIDE 10 MEQ/100ML IV SOLN: 10.0000 meq | INTRAVENOUS | Status: DC

## 2023-08-01 MED ORDER — SODIUM CHLORIDE 0.9 % IV BOLUS
1000.0000 mL | Freq: Once | INTRAVENOUS | Status: AC
  Administered 2023-08-01: 1000 mL via INTRAVENOUS

## 2023-08-01 MED ORDER — ACETAMINOPHEN 325 MG PO TABS: 650.0000 mg | ORAL_TABLET | Freq: Four times a day (QID) | ORAL | Status: DC | PRN

## 2023-08-01 MED ORDER — SENNOSIDES-DOCUSATE SODIUM 8.6-50 MG PO TABS
1.0000 | ORAL_TABLET | Freq: Two times a day (BID) | ORAL | Status: DC
  Administered 2023-08-01 – 2023-08-03 (×4): 1 via ORAL
  Filled 2023-08-01 (×4): qty 1

## 2023-08-01 MED ORDER — ACETAMINOPHEN 650 MG RE SUPP: 650.0000 mg | Freq: Four times a day (QID) | RECTAL | Status: DC | PRN

## 2023-08-01 MED ORDER — POTASSIUM CHLORIDE 2 MEQ/ML IV SOLN
INTRAVENOUS | Status: DC
  Filled 2023-08-01: qty 1000

## 2023-08-01 NOTE — H&P (Addendum)
History and Physical      Cynthia Velasquez QVZ:[id number] DOB: 02-07-1951 DOA: 07/31/2023; DOS: 08/01/2023  PCP: [name], MD  Patient coming from: home   I have personally briefly reviewed patient's old medical records in [facility]  Chief Complaint: Abdominal pain  HPI: Cynthia Velasquez is a 72 y.o. female with medical history significant for non-Hodgkin's lymphoma, essential hypertension, who is admitted to [facility] on 07/31/2023 with suspected extrinsic biliary obstruction after presenting from home to [facility] ED complaining of abdominal pain.   The patient reports to 3 days of progressive sharp, generalized, nonradiating abdominal discomfort, worse with palpation over the abdomen.  She notes that this has been associated with intermittent nausea in the absence of vomiting, resulting in decline in oral intake of both food and water over the last few days.  Denies any associated diarrhea, melena, or hematochezia.  Denies any recent subjective fever, chills, rigors, or generalized myalgias.  No recent dysuria or gross hematuria.  No recent chest pain or shortness of breath.   Medical history notable for non-Hodgkin's lymphoma for which she follows with Dr. Wyvonnia Lora as her outpatient oncologist.  Per chart review, most recent prior liver enzymes were checked on 07/16/2023 and were notable for the following at that time: Alkaline phosphatase 75, total bilirubin 0.5, AST 37, ALT 21.  She has a history of cholecystectomy.    ED Course:  Vital signs in the ED were notable for the following: Afebrile; heart rates in the 80s to 90s; systolic blood pressures in the 120s to 140s; respiratory rate 16-21, oxygen saturation 95 to 98% on room air.  Labs were notable for the following: CMP notable for the following: Potassium 3.1, creatinine 1.72 compared to 1.16 on 07/16/2023, calcium adjusted for mild hypoalbuminemia noted to be 10.2, albumin 3.2, alkaline phosphatase 307, total  bilirubin 2.0, AST 298, ALT 135.  Lipase 24.  CBC notable for open cell count 4800, hemoglobin 10.8 compared to 10.7 on 07/16/2023, platelet count 131 compared to 181 on 07/16/2023.  Urinalysis has been ordered, Therazole currently pending.  Per my interpretation, EKG in ED demonstrated the following: No EKG performed today.  Imaging in the ED, per corresponding formal radiology read, was notable for the following: CT abdomen/pelvis with contrast, in comparison to PET/CT from 05/09/2023 shows new enlarged conglomerate lymphadenopathy about the aorta, IVC, right iliac vessels, porta hepatis, consistent with progression of lymphoma, while noting status post cholecystectomy, and demonstrating unremarkable appearing liver, biliary tree and pancreas.  No evidence of bowel obstruction, perforation, or abscess.  EDP has contacted on-call [facility] gastroenterology, Dr. Levora Angel, requesting consultation in the morning.  Additionally, I have added her outpatient oncologist, Dr. Wyvonnia Lora to the treatment team starting at 7 AM today.   While in the ED, the following were administered: Normal saline x 1 L bolus, Dilaudid 0.5 mg IV x 1 dose.  Subsequently, the patient was admitted for further evaluation management of concern for extrinsic biliary obstruction in the setting of radiographic evidence of progression of lymphoma, with presenting labs notable for acute kidney injury, hypokalemia, after presenting with abdominal discomfort.    Review of Systems: As per HPI otherwise 10 point review of systems negative.   Past Medical History:  Diagnosis Date   Anal cancer (HCC) dx'd 05/2019   Anxiety    Arthritis    Bilateral breast cancer (HCC) 10/21/2013   Breast cancer (HCC)    Cancer (HCC)    Depression    Gallstones  GERD (gastroesophageal reflux disease)    doesn't take any meds for this   Headache    h/o migraines       History of bladder infections    History of hiatal hernia    History of  migraine    Hyperlipidemia    takes Simvastatin daily   Hypertension    takes Hyzaar   Joint pain    Joint swelling    Lumbar stenosis    Neuropathy 09/07/2013   Pneumonia    Pre-diabetes     Past Surgical History:  Procedure Laterality Date   ABDOMINAL HYSTERECTOMY     bone spur removed from left foot     CHOLECYSTECTOMY     COLONOSCOPY     COLONOSCOPY WITH PROPOFOL Bilateral 12/07/2021   Procedure: COLONOSCOPY WITH PROPOFOL;  Surgeon: Vida Rigger, MD;  Location: [facility] ENDOSCOPY;  Service: Endoscopy;  Laterality: Bilateral;   double mastectomy      ESOPHAGOGASTRODUODENOSCOPY N/A 12/07/2021   Procedure: ESOPHAGOGASTRODUODENOSCOPY (EGD);  Surgeon: Vida Rigger, MD;  Location: [facility];  Service: Endoscopy;  Laterality: N/A;   HERNIA REPAIR     umbilical   IR IMAGING GUIDED PORT INSERTION  06/25/2022   right knee arthroscopy     right shoulder arthroscopy     surgery for hiatal hernia     TONSILLECTOMY     TOTAL HIP ARTHROPLASTY Left 02/12/2013   TOTAL HIP ARTHROPLASTY Left 02/12/2013   Procedure: LEFT TOTAL HIP ARTHROPLASTY;  Surgeon: Nestor Lewandowsky, MD;  Location: [facility] OR;  Service: Orthopedics;  Laterality: Left;   TOTAL HIP ARTHROPLASTY Right 05/03/2013   Procedure: TOTAL HIP ARTHROPLASTY;  Surgeon: Nestor Lewandowsky, MD;  Location: [facility] OR;  Service: Orthopedics;  Laterality: Right;   TOTAL KNEE ARTHROPLASTY Right 02/27/2015   Procedure: TOTAL KNEE ARTHROPLASTY;  Surgeon: Gean Birchwood, MD;  Location: [facility] OR;  Service: Orthopedics;  Laterality: Right;   TOTAL SHOULDER ARTHROPLASTY Right 09/12/2016   Procedure: RIGHT TOTAL SHOULDER ARTHROPLASTY;  Surgeon: Jones Broom, MD;  Location: [facility] OR;  Service: Orthopedics;  Laterality: Right;  RIGHT TOTAL SHOULDER ARTHROPLASTY    Social History:  reports that she has never smoked. She has never used smokeless tobacco. She reports that she does not drink alcohol and does not use drugs.   Allergies  Allergen Reactions   Tramadol Itching and  Rash   Belinostat Nausea And Vomiting    Headache and  nausea during infusion. See progress note from 02/24/23   Codeine Itching and Rash   Vicodin [Hydrocodone-Acetaminophen] Itching and Rash    Family History  Adopted: Yes  Problem Relation Age of Onset   Allergic rhinitis Neg Hx    Angioedema Neg Hx    Atopy Neg Hx    Eczema Neg Hx    Immunodeficiency Neg Hx    Urticaria Neg Hx    Asthma Neg Hx     Family history reviewed and not pertinent    Prior to Admission medications   Medication Sig Start Date End Date Taking? Authorizing Provider  aspirin-acetaminophen-caffeine (EXCEDRIN MIGRAINE) [phone] MG tablet Take 2 tablets by mouth every 6 (six) hours as needed for migraine.    [provider]  DULoxetine (CYMBALTA) 60 MG capsule Take 1 capsule (60 mg total) by mouth 2 (two) times daily. Take 1 capsule (30 mg) at bedtime for 1 week, if no side effects, then increase to 2 capsules (60 mg) thereafter. 12/20/22   Antony Madura, MD  fluconazole (DIFLUCAN) 200 MG tablet Take  1 tablet (200 mg total) by mouth daily. 06/27/23   [name], MD  fluconazole (DIFLUCAN) 200 MG tablet Take 1 tablet (200 mg total) by mouth daily. 06/27/23   [name], MD  losartan (COZAAR) 100 MG tablet Take 100 mg by mouth daily. 01/14/17   [provider]  magic mouthwash (multi-ingredient) oral suspension Take 5 mLs by mouth 3 (three) times daily as needed. 06/27/23   [name], MD  magic mouthwash w/lidocaine SOLN Take 5 mLs by mouth 3 (three) times daily as needed for mouth pain. Suspension contains total volume 80 ml of distilled water 80ml of maalox 80ml of 2% viscous lidocaine 80ml of nystatin at 500000 units per 5 ml 80ml of diphenhydramine at 12.5mg /27ml 06/27/23   [name], MD  Menthol-Methyl Salicylate (SALONPAS PAIN RELIEF PATCH EX) Apply 1 patch topically daily as needed (pain).    [provider]  methocarbamol (ROBAXIN)  750 MG tablet Take 1 tablet (750 mg total) by mouth every 8 (eight) hours as needed for muscle spasms. 01/08/23   [name], MD  naloxone [facility]) nasal spray 4 mg/0.1 mL 1 squirt in the naris for unresponsiveness 06/19/23   Melene Plan, DO  Nystatin (GERHARDT'S BUTT CREAM) CREA Apply 1 Application topically 2 (two) times daily. Patient taking differently: Apply 1 Application topically 2 (two) times daily as needed for irritation. 11/05/22   Drema Dallas, MD  nystatin (MYCOSTATIN/NYSTOP) powder Apply 1 Application topically 3 (three) times daily. Patient taking differently: Apply 1 Application topically 3 (three) times daily as needed (redness, rash). 08/30/22   Walisiewicz, Kaitlyn E, PA-C  ondansetron (ZOFRAN) 8 MG tablet Take 1 tablet (8 mg total) by mouth every 8 (eight) hours as needed for nausea or vomiting. 02/07/23   [name], MD  pantoprazole (PROTONIX) 40 MG tablet Take 1 tablet (40 mg total) by mouth daily. Patient taking differently: Take 40 mg by mouth daily as needed (for acid reflux). 11/06/22   Drema Dallas, MD  potassium chloride SA (KLOR-CON M) 20 MEQ tablet Take 1 tablet (20 mEq total) by mouth 2 (two) times daily. 06/27/23   [name], MD  prochlorperazine (COMPAZINE) 10 MG tablet Take 1 tablet (10 mg total) by mouth every 6 (six) hours as needed for nausea or vomiting. 02/07/23   [name], MD  sucralfate (CARAFATE) 1 g tablet Take 1 tablet (1 g total) by mouth 2 (two) times daily. Dissolve tab in 5-10 ml of water and then swallow the slurry. 06/27/23   [name], MD  sucralfate (CARAFATE) 1 g tablet Dissolve 1 tablet in 5-10ml of water twice a day and then swallow slurry. 06/27/23   [name], MD  triamcinolone ointment (KENALOG) 0.5 % Apply 1 Application topically 2 (two) times daily. To rash under both armpits 05/20/23   [name], MD     Objective    Physical Exam: Vitals:   07/31/23 2200  07/31/23 2230 07/31/23 2340 08/01/23 0030  BP: 125/86 (!) 142/83  136/87  Pulse: 84 94  93  Resp: (!) 21 18  19   Temp:   98.8 F (37.1 C)   TempSrc:   Oral   SpO2: 97% 95%  94%  Weight:      Height:        General: appears to be stated age; alert, oriented Skin: warm, dry, no rash Head:  AT/[place] Mouth:  Oral mucosa membranes appear dry, normal dentition  Neck: supple; trachea midline Heart:  RRR; did not appreciate any M/R/G Lungs: CTAB, did not appreciate any wheezes, rales, or rhonchi Abdomen: + BS; soft, ND, generalized tenderness to palpation in the absence of guarding, rigidity, or rebound tenderness Vascular: 2+ pedal pulses b/l; 2+ radial pulses b/l Extremities: no peripheral edema, no muscle wasting Neuro: strength and sensation intact in upper and lower extremities b/l     Labs on Admission: I have personally reviewed following labs and imaging studies  CBC: Recent Labs  Lab 07/31/23 1917  WBC 4.8  HGB 10.8*  HCT 33.3*  MCV 85.8  PLT 131*   Basic Metabolic Panel: Recent Labs  Lab 07/31/23 1917  NA 140  K 3.1*  CL 109  CO2 19*  GLUCOSE 88  BUN 22  CREATININE 1.71*  CALCIUM 9.6   GFR: Estimated Creatinine Clearance: 32.4 mL/min (A) (by C-G formula based on SCr of 1.71 mg/dL (H)). Liver Function Tests: Recent Labs  Lab 07/31/23 1917  AST 298*  ALT 135*  ALKPHOS 307*  BILITOT 2.0*  PROT 5.8*  ALBUMIN 3.2*   Recent Labs  Lab 07/31/23 1917  LIPASE 24   No results for input(s): "AMMONIA" in the last 168 hours. Coagulation Profile: No results for input(s): "INR", "PROTIME" in the last 168 hours. Cardiac Enzymes: No results for input(s): "CKTOTAL", "CKMB", "CKMBINDEX", "TROPONINI" in the last 168 hours. BNP (last 3 results) No results for input(s): "PROBNP" in the last 8760 hours. HbA1C: No results for input(s): "HGBA1C" in the last 72 hours. CBG: No results for input(s): "GLUCAP" in the last 168 hours. Lipid Profile: No results for  input(s): "CHOL", "HDL", "LDLCALC", "TRIG", "CHOLHDL", "LDLDIRECT" in the last 72 hours. Thyroid Function Tests: No results for input(s): "TSH", "T4TOTAL", "FREET4", "T3FREE", "THYROIDAB" in the last 72 hours. Anemia Panel: No results for input(s): "VITAMINB12", "FOLATE", "FERRITIN", "TIBC", "IRON", "RETICCTPCT" in the last 72 hours. Urine analysis:    Component Value Date/Time   COLORURINE YELLOW 03/28/2023 1623   APPEARANCEUR CLEAR 03/28/2023 1623   LABSPEC 1.014 03/28/2023 1623   LABSPEC 1.015 03/11/2017 1600   PHURINE 6.0 03/28/2023 1623   GLUCOSEU NEGATIVE 03/28/2023 1623   GLUCOSEU Negative 03/11/2017 1600   HGBUR NEGATIVE 03/28/2023 1623   BILIRUBINUR NEGATIVE 03/28/2023 1623   BILIRUBINUR Negative 03/11/2017 1600   KETONESUR NEGATIVE 03/28/2023 1623   PROTEINUR NEGATIVE 03/28/2023 1623   UROBILINOGEN 0.2 03/11/2017 1600   NITRITE POSITIVE (A) 03/28/2023 1623   LEUKOCYTESUR NEGATIVE 03/28/2023 1623   LEUKOCYTESUR Small 03/11/2017 1600    Radiological Exams on Admission: CT ABDOMEN PELVIS W CONTRAST  Result Date: 08/01/2023 CLINICAL DATA:  Nausea and decreased p.o. intake.  Abdominal pain. EXAM: CT ABDOMEN AND PELVIS WITH CONTRAST TECHNIQUE: Multidetector CT imaging of the abdomen and pelvis was performed using the standard protocol following bolus administration of intravenous contrast. RADIATION DOSE REDUCTION: This exam was performed according to the departmental dose-optimization program which includes automated exposure control, adjustment of the mA and/or kV according to patient size and/or use of iterative reconstruction technique. CONTRAST:  80mL OMNIPAQUE IOHEXOL 300 MG/ML  SOLN COMPARISON:  CT abdomen and pelvis 10/29/2022 and PET/CT 05/09/2023 FINDINGS: Lower chest: No acute abnormality. Hepatobiliary: Cholecystectomy. Unremarkable liver and biliary tree. Pancreas: Unremarkable. Spleen: Unremarkable. Adrenals/Urinary Tract: Stable adrenal glands and kidneys. No urinary  calculi or hydronephrosis. The bladder is obscured by streak artifact. Stomach/Bowel: Stomach is within normal limits. No bowel obstruction or bowel wall thickening. Vascular/Lymphatic: Aortic atherosclerotic calcification. New enlarged conglomerate lymphadenopathy about the aorta  and IVC extending along the right iliac vessels. This measures 3.1 x 8.8 cm in the axial plane on series 2/image 46. Retrocrural node measuring 1.6 cm (series 2/image 21). 1.2 cm porta hepatis node on series 2/image 30. 1.4 cm central mesenteric node on series 2/image 54. Right inguinal lymphadenopathy measuring up to 1.5 cm (series 2/image 84). The adenopathy compresses the inferior IVC and right common iliac vein. Reproductive: Hysterectomy.  Streak artifact limits assessment. Other: Smaller free fluid in the pelvis. No free intraperitoneal air. Musculoskeletal: Bilateral THA. No acute fracture or destructive osseous lesion. IMPRESSION: 1. New enlarged conglomerate lymphadenopathy about the aorta and IVC extending along the right iliac vessels. Additional enlarged retrocrural, porta hepatis, central mesenteric, and right inguinal lymph nodes. Findings are consistent with progression of lymphoma. Aortic Atherosclerosis (ICD10-I70.0). Electronically Signed   By: Minerva Fester M.D.   On: 08/01/2023 00:13      Assessment/Plan    Principal Problem:   Biliary obstruction Active Problems:   Hypokalemia   Essential hypertension   T-cell lymphoma (HCC)   Transaminitis   Generalized abdominal pain   AKI (acute kidney injury) (HCC)   Depression   GAD (generalized anxiety disorder)      #) Extrinsic biliary obstruction: Presentation concerning for extrinsic biliary obstruction in the setting of interval development of transaminitis with concern for cholestatic pattern, noting fourfold increase in total bilirubin as well as greater than 3 fold increase in alkaline phosphatase over the last 2 weeks, as further quantified  above.  Today CT abdomen/pelvis shows interval increase in lymphadenopathy concerning for progression of lymphoma, potentially presenting a source of extrinsic obstruction relating to the biliary tract, will noting no evidence of choledocholithiasis.  She is noted to be status post cholecystectomy.  No evidence of associated fever, hypotension, or altered mental status to suggest ascending cholangitis.  She may warrant further imaging evaluation, including potential MRCP.  EDP is contacted on-call [facility] gastroenterology, requesting consultation in the morning.  Additionally, given concern for contribution from interval progression and lymphoma, I have added her outpatient oncologist, Dr. Candise Che, to the treatment team starting at 7 AM today.   Plan: Will keep n.p.o. for now.  Prn IV Dilaudid.  He will gastroenterology to consult.  Outpatient oncology added to treatment team, as above.  Repeat CMP in the morning.  Check direct bilirubin, GGT, INR, LDH.  CBC in the morning.  Monitor strict I's and O's and daily weights.             #) Generalized abdominal pain: 2 to 3 days of progressive generalized abdominal discomfort, suspected to be on the basis of CT abdomen/pelvis evidence new conglomerate lymphadenopathy consistent with progression of known underlying lymphoma.  No evidence of acute peritoneal findings on physical exam.  No evidence of bowel obstruction, perforation, abscess.  Acute pancreatitis, and presenting imaging confirms a history of cholecystectomy.  Plan: Prn IV Dilaudid.  Further evaluation management of concern for extrinsic biliary obstruction, as above, including repeat CMP, CBC, as well as GGT and direct bilirubin.  GI and oncology to consult in the morning, as above.  Follow-up result urinalysis.                 #) Acute Kidney Injury: Presenting creatinine 1.72 compared to most recent prior value of 1.16 on 07/16/2023.  Per chart review, it appears that the  patient's baseline creatinine ranges 1-1.3.  Suspect a prerenal contribution in the setting of dehydration given her report of significant decline in  oral intake of both food and water over the last 2 to 3 days in the setting of progression of abdominal discomfort associated with nausea over that timeframe.  Differential includes potential postrenal obstruction, particular given her interval progression of lymphoma, although CT abdomen/pelvis shows no evidence of overt postrenal obstruction at this time.  Potential pharmacologic exacerbation in the setting of outpatient losartan.  Urinalysis with microscopy has been ordered, Therazole currently pending.  Appears less likely they are present tumor lysis syndrome.  LDH ordered.  Consider uric acid level, and follow-up for recommendations from oncology, as above.  Plan: monitor strict I's & O's and daily weights. Attempt to avoid nephrotoxic agents.  Hold home losartan for now.  Refrain from NSAIDs. Repeat CMP in the morning. Check serum magnesium level.  Follow-up result urinalysis with microscopy.  Add-on random urine sodium and random urine creatinine.  Continuous lactated Ringer's.                  #) Hypokalemia: presenting potassium level noted to be 3.1.  In the setting of concomitant acute kidney injury, will be slightly more conservative and supplementation efforts.   Plan: monitor on tele. Will add 10 mill equivalents per liter KCl to existing lactated Ringer's.  Add-on serum mag level. CMP, mag level in the AM.                 #) Non-Hodgkin's lymphoma: Documented history of such, for which she follows with Dr. Wyvonnia Lora as her outpatient oncologist.  Today's CT Abdo/pelvis is suggestive of progression of lymphoma, as further described above.   Plan: I have added Dr. Candise Che to the treatment team starting at 7 AM on 08/01/2023.  Check LDH.                   #) Essential Hypertension: documented h/o  such, with outpatient antihypertensive regimen including losartan, which will be held for now in the setting of acute kidney injury.  SBP's in the ED today: 120s to 140s mmHg.   Plan: Close monitoring of subsequent BP via routine VS. hold home losartan for now, as above.                  #) Depression/GAD: documented h/o such. On Cymbalta as outpatient.    Plan: Will hold home Cymbalta for now in the setting of current n.p.o. status.        DVT prophylaxis: SCD's   Code Status: Full code Family Communication: none Disposition Plan: Per Rounding Team Consults called: EDP contacted on-call [facility], Dr. Levora Angel, requesting consult in the morning; I have added her outpatient oncologist, Dr. Candise Che to treatment team starting at 7 AM on 08/01/2023, as further detailed above;  Admission status: Inpatient    I SPENT GREATER THAN 75  MINUTES IN CLINICAL CARE TIME/MEDICAL DECISION-MAKING IN COMPLETING THIS ADMISSION.     Chaney Born Lynsi Dooner DO [facility] From 7PM - 7AM   08/01/2023, 1:57 AM

## 2023-08-01 NOTE — Progress Notes (Signed)
HEMATOLOGY/ONCOLOGY INPATIENT PROGRESS NOTE  Date of Service: 08/01/2023  Inpatient Attending: .Alba Cory, MD  SUMMARY OF ONCOLOGIC HISTORY:     Oncology History Overview Note   Cancer Staging Anal cancer (HCC) Staging form: Anus, AJCC 8th Edition - Clinical stage from 06/28/2019: Stage IIA (cT2, cN0, cM0) - Signed by [name], NP on 06/28/2019   Breast cancer of upper-outer quadrant of left female breast [facility]) Staging form: Breast, AJCC 7th Edition - Clinical stage from 09/07/2013: Stage IIA (T2, N0, cM0) - Signed by [name], MD on 09/07/2013 - Pathologic: Stage IIA (T2, N0, cM0) - Signed by [name], MD on 09/07/2013   Breast cancer of upper-outer quadrant of right female breast [facility]) Staging form: Breast, AJCC 7th Edition - Clinical stage from 09/07/2013: Stage IA (T1a, N0, cM0) - Signed by [name], MD on 09/07/2013 - Pathologic: Stage IA (T1a, N0, cM0) - Signed by [name], MD on 09/07/2013      Breast cancer of upper-outer quadrant of left female breast (HCC)   07/10/2007 Procedure     US biopsy showed invasive ductal carcinoma 0.8cm, Nottingham grade 3     07/30/2007 Surgery     She had left breast lumpectomy and LN biopsy which showed high grade invasive ductal cancer Nottingham grade 3, 2.7 cm, ER/PR/Her2 neu negative     11/24/2007 Surgery     Patient elected for bilateral mastectomy     09/07/2008 - 03/08/2009 Chemotherapy     dates are approximate: she received adriamycin and cytoxan followed by Taxol     03/08/2009 - 05/08/2009 Radiation Therapy     dates are approximate, she received XRT      07/09/2021 Imaging     CT CAP   IMPRESSION: 1. No evidence to suggest locally recurrent ianorectal neoplasm or metastatic disease in the chest, abdomen or pelvis. 2. Status post bilateral modified radical mastectomy and bilateral axillary lymph node dissection. 3. Three small supraumbilical ventral hernias containing only omental fat.  No associated bowel incarceration or obstruction at this time. 4. Aortic atherosclerosis. 5. Additional incidental findings, as above.     Breast cancer of upper-outer quadrant of right female breast (HCC)   07/10/2006 Procedure     stereotactic biopsy showed atypical hyperplasia     10/23/2006 Surgery     right lumpectomy showed invasive ductal ca (Nottingham grade 1)  2mm and DCIS, ER/PR positive Her 2 negative     10/09/2007 - 10/08/2012 Chemotherapy     Patient was placed on Tamoxifen     03/24/2017 Imaging     No acute findings. No evidence of recurrent carcinoma or metastatic disease     07/09/2021 Imaging     CT CAP   IMPRESSION: 1. No evidence to suggest locally recurrent ianorectal neoplasm or metastatic disease in the chest, abdomen or pelvis. 2. Status post bilateral modified radical mastectomy and bilateral axillary lymph node dissection. 3. Three small supraumbilical ventral hernias containing only omental fat. No associated bowel incarceration or obstruction at this time. 4. Aortic atherosclerosis. 5. Additional incidental findings, as above.     Anal cancer (HCC)   06/04/2019 Procedure     Colonoscopy per Dr. [name]: Findings-the digital rectal exam revealed a 3 cm diameter firm rectal mass.  The mass was noncircumferential and located predominantly at the right bowel wall at the anorectal junction. A nonobstructing mass was found at the anus and in the rectum       06/04/2019 Initial Biopsy  Follow pathology: Large intestine, rectum biopsy: Invasive well to moderately differentiated squamous cell carcinoma.  No rectal mucosa present.  There is strong diffuse expression of P 16 immunostain.  CDX 2, p63 and mCEA immunostains are also used in the diagnostic work-up of the case.     06/04/2019 Initial Diagnosis     Anal cancer (HCC)     06/21/2019 PET scan     IMPRESSION: 1. Anorectal primary. No hypermetabolic metastatic disease within the chest, abdomen,  or pelvis. Perirectal nodes, at least 1 of which is new since 02/26/2018 CT, suspicious based on size and interval development. 2. Mild limitations secondary to beam hardening artifact from bilateral hip arthroplasty. 3.  Aortic Atherosclerosis (ICD10-I70.0).     06/28/2019 Cancer Staging     Staging form: Anus, AJCC 8th Edition - Clinical stage from 06/28/2019: Stage IIA (cT2, cN0, cM0) - Signed by [name], NP on 06/28/2019     06/28/2019 - 07/26/2019 Chemotherapy     concurrent chemoRT with Mitomycin and 5FU on week 1 and week 5 on starting 06/28/19. Last dose on 07/26/19     06/28/2019 - 08/09/2019 Radiation Therapy     concurrent chemoRT with Dr Mitzi Hansen 06/28/19-08/09/19     11/01/2019 PET scan     IMPRESSION: 1. Marked interval decrease in hypermetabolism noted at the level of the anal rectal primary. No evidence for hypermetabolic metastatic disease in the chest, abdomen, or pelvis. The perirectal lymph nodes identified previously have resolved in the interval. 2.  Aortic Atherosclerois (ICD10-170.0)     08/07/2020 Imaging     CT CAP  IMPRESSION: 1. No findings for residual/recurrent anal tumor, regional lymphadenopathy or metastatic disease. 2. Status post cholecystectomy. No biliary dilatation. 3. Small anterior abdominal wall hernia containing fat. 4. Aortic atherosclerosis.   Aortic Atherosclerosis (ICD10-I70.0).       07/09/2021 Imaging     CT CAP   IMPRESSION: 1. No evidence to suggest locally recurrent ianorectal neoplasm or metastatic disease in the chest, abdomen or pelvis. 2. Status post bilateral modified radical mastectomy and bilateral axillary lymph node dissection. 3. Three small supraumbilical ventral hernias containing only omental fat. No associated bowel incarceration or obstruction at this time. 4. Aortic atherosclerosis. 5. Additional incidental findings, as above.     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 female who is an established patient of mine for management of T cell lymphoma.  She was recovering after palliative radiation to her bulky lymphadenopathy in the neck. She is now admitted with increasing abdominal pain and noted to have significant progression of her T-cell lymphoma in the abdomen including the periportal area. Noted to have elevated liver function tests without overt biliary obstruction. Patient has been seen by GI who have not recommended ERCP due to no evidence of stentable lesion/obstruction.  Today, she complains of upper abdominal pain. She reports that she has pain when taking deep breaths.   She reports that her p.o. intake has improved, but she continues to  feel like food gets stuck in her chest after eating. She reports difficulty breathing after eating sometimes.   Patient complains of vertigo/dizziness. She was seen by her PCP and tried Epley maneuver exercises that did not improve symptoms.   She complains of lack of energy and fatigue and reports difficulty walking across the room.   Patient has not had a bowel movement in 2 weeks. She reports that she has tried at least 4 different medications including Linzess, Mirolax, Enema, and another.   She denies any leg swelling.  Patient complains of dry mouth and hydration issues.   OBJECTIVE:  Mild distress from abdominal discomfort  PHYSICAL EXAMINATION: . Vitals:   08/01/23 0030 08/01/23 0335 08/01/23 0356 08/01/23 0825  BP: 136/87  (!) 144/82 125/72  Pulse: 93  80 80  Resp: 19   18  Temp:  98.2 F (36.8 C) 98.4 F (36.9 C) 97.9  F (36.6 C)  TempSrc:  Oral  Oral  SpO2: 94%  92% 93%  Weight:      Height:       Filed Weights   07/31/23 1916  Weight: 84.8 kg   .Body mass index is 31.12 kg/m.  GENERAL:alert, in no acute distress and comfortable SKIN: skin color, texture, turgor are normal, no rashes or significant lesions EYES: normal, conjunctiva are pink and non-injected, sclera clear OROPHARYNX:no exudate, no erythema and lips, buccal mucosa, and tongue normal  NECK: supple, no JVD, thyroid normal size, non-tender, without nodularity LYMPH: small Bilateral cervical palpable adenopathy Send LUNGS: clear to auscultation with normal respiratory effort HEART: regular rate & rhythm,  no murmurs and no lower extremity edema ABDOMEN: Abdominal fullness with tenderness to palpation over the epigastric regions and bilateral flanks.  No rigidity or rebound PSYCH: alert & oriented x 3 with fluent speech NEURO: no focal motor/sensory deficits  MEDICAL HISTORY:  Past Medical History:  Diagnosis Date   Anal cancer (HCC) dx'd 05/2019   Anxiety    Arthritis    Bilateral breast cancer (HCC) 10/21/2013   Breast cancer (HCC)    Cancer (HCC)    Depression    Gallstones    GERD (gastroesophageal reflux disease)    doesn't take any meds for this   Headache    h/o migraines       History of bladder infections    History of hiatal hernia    History of migraine    Hyperlipidemia    takes Simvastatin daily   Hypertension    takes Hyzaar   Joint pain    Joint swelling    Lumbar stenosis    Lymphoma (HCC)    NHL   Neuropathy 09/07/2013   Pneumonia    Pre-diabetes     SURGICAL HISTORY: Past Surgical History:  Procedure Laterality Date   ABDOMINAL HYSTERECTOMY     bone spur removed from left foot     CHOLECYSTECTOMY     COLONOSCOPY     COLONOSCOPY WITH PROPOFOL Bilateral 12/07/2021   Procedure: COLONOSCOPY WITH PROPOFOL;  Surgeon: Vida Rigger, MD;  Location: [facility];  Service: Endoscopy;  Laterality:  Bilateral;   double mastectomy      ESOPHAGOGASTRODUODENOSCOPY N/A 12/07/2021   Procedure: ESOPHAGOGASTRODUODENOSCOPY (EGD);  Surgeon: Vida Rigger, MD;  Location: [facility];  Service: Endoscopy;  Laterality: N/A;   HERNIA REPAIR     umbilical   IR IMAGING GUIDED PORT INSERTION  06/25/2022   right knee arthroscopy     right shoulder arthroscopy     surgery  for hiatal hernia     TONSILLECTOMY     TOTAL HIP ARTHROPLASTY Left 02/12/2013   TOTAL HIP ARTHROPLASTY Left 02/12/2013   Procedure: LEFT TOTAL HIP ARTHROPLASTY;  Surgeon: Nestor Lewandowsky, MD;  Location: [facility] OR;  Service: Orthopedics;  Laterality: Left;   TOTAL HIP ARTHROPLASTY Right 05/03/2013   Procedure: TOTAL HIP ARTHROPLASTY;  Surgeon: Nestor Lewandowsky, MD;  Location: [facility] OR;  Service: Orthopedics;  Laterality: Right;   TOTAL KNEE ARTHROPLASTY Right 02/27/2015   Procedure: TOTAL KNEE ARTHROPLASTY;  Surgeon: Gean Birchwood, MD;  Location: [facility] OR;  Service: Orthopedics;  Laterality: Right;   TOTAL SHOULDER ARTHROPLASTY Right 09/12/2016   Procedure: RIGHT TOTAL SHOULDER ARTHROPLASTY;  Surgeon: Jones Broom, MD;  Location: [facility] OR;  Service: Orthopedics;  Laterality: Right;  RIGHT TOTAL SHOULDER ARTHROPLASTY    SOCIAL HISTORY: Social History   Socioeconomic History   Marital status: Single    Spouse name: Not on file   Number of children: 1   Years of education: Not on file   Highest education level: Not on file  Occupational History   Not on file  Tobacco Use   Smoking status: Never   Smokeless tobacco: Never  Vaping Use   Vaping status: Never Used  Substance and Sexual Activity   Alcohol use: No    Comment: occ   Drug use: No   Sexual activity: Not Currently  Other Topics Concern   Not on file  Social History Narrative   Are you right handed or left handed? Right   Are you currently employed ? no   What is your current occupation?retired   Do you live at home alone? no   Who lives with you? Daughter and granddaughter   What  type of home do you live in: 1 story or 2 story? one   Caffeine 1 cup a day   Social Determinants of Health   Financial Resource Strain: Not on file  Food Insecurity: No Food Insecurity (08/01/2023)   Hunger Vital Sign    Worried About Running Out of Food in the Last Year: Never true    Ran Out of Food in the Last Year: Never true  Transportation Needs: No Transportation Needs (08/01/2023)   PRAPARE - Administrator, Civil Service (Medical): No    Lack of Transportation (Non-Medical): No  Physical Activity: Not on file  Stress: Not on file  Social Connections: Unknown (01/29/2022)   Received from [facility]   Social Network    Social Network: Not on file  Intimate Partner Violence: Not At Risk (08/01/2023)   Humiliation, Afraid, Rape, and Kick questionnaire    Fear of Current or Ex-Partner: No    Emotionally Abused: No    Physically Abused: No    Sexually Abused: No    FAMILY HISTORY: Family History  Adopted: Yes  Problem Relation Age of Onset   Allergic rhinitis Neg Hx    Angioedema Neg Hx    Atopy Neg Hx    Eczema Neg Hx    Immunodeficiency Neg Hx    Urticaria Neg Hx    Asthma Neg Hx     ALLERGIES:  is allergic to tramadol, belinostat, codeine, and vicodin [hydrocodone-acetaminophen].  MEDICATIONS:  Scheduled Meds:  Chlorhexidine Gluconate Cloth  6 each Topical Daily   methylPREDNISolone (SOLU-MEDROL) injection  125 mg Intravenous BID   sodium chloride flush  10-40 mL Intracatheter Q12H   Continuous Infusions:  lactated ringers 1,000 mL with potassium chloride  10 mEq infusion 100 mL/hr at 08/01/23 0412   potassium chloride 10 mEq (08/01/23 1046)   PRN Meds:.acetaminophen **OR** acetaminophen, HYDROmorphone (DILAUDID) injection, naLOXone (NARCAN)  injection, ondansetron (ZOFRAN) IV, sodium chloride flush  REVIEW OF SYSTEMS:    10 Point review of Systems was done is negative except as noted above.   LABORATORY DATA:  I have  reviewed the data as listed  .    Latest Ref Rng & Units 08/01/2023    4:00 AM 07/31/2023    7:17 PM 07/16/2023    2:02 PM  CBC  WBC 4.0 - 10.5 K/uL 5.4  4.8  3.2   Hemoglobin 12.0 - 15.0 g/dL 16.1  09.6  04.5   Hematocrit 36.0 - 46.0 % 32.8  33.3  32.6   Platelets 150 - 400 K/uL 137  131  181     .    Latest Ref Rng & Units 08/01/2023    4:00 AM 07/31/2023    7:17 PM 07/16/2023    2:02 PM  CMP  Glucose 70 - 99 mg/dL 81  88  409   BUN 8 - 23 mg/dL 21  22  12    Creatinine 0.44 - 1.00 mg/dL 8.11  9.14  7.82   Sodium 135 - 145 mmol/L 140  140  141   Potassium 3.5 - 5.1 mmol/L 3.1  3.1  3.4   Chloride 98 - 111 mmol/L 110  109  108   CO2 22 - 32 mmol/L 19  19  28    Calcium 8.9 - 10.3 mg/dL 9.1  9.6  9.3   Total Protein 6.5 - 8.1 g/dL 5.3  5.8  6.3   Total Bilirubin <1.2 mg/dL 2.0  2.0  0.5   Alkaline Phos 38 - 126 U/L 289  307  75   AST 15 - 41 U/L 284  298  37   ALT 0 - 44 U/L 124  135  21      RADIOGRAPHIC STUDIES: I have personally reviewed the radiological images as listed and agreed with the findings in the report. CT ABDOMEN PELVIS W CONTRAST  Result Date: 08/01/2023 CLINICAL DATA:  Nausea and decreased p.o. intake.  Abdominal pain. EXAM: CT ABDOMEN AND PELVIS WITH CONTRAST TECHNIQUE: Multidetector CT imaging of the abdomen and pelvis was performed using the standard protocol following bolus administration of intravenous contrast. RADIATION DOSE REDUCTION: This exam was performed according to the departmental dose-optimization program which includes automated exposure control, adjustment of the mA and/or kV according to patient size and/or use of iterative reconstruction technique. CONTRAST:  80mL OMNIPAQUE IOHEXOL 300 MG/ML  SOLN COMPARISON:  CT abdomen and pelvis 10/29/2022 and PET/CT 05/09/2023 FINDINGS: Lower chest: No acute abnormality. Hepatobiliary: Cholecystectomy. Unremarkable liver and biliary tree. Pancreas: Unremarkable. Spleen: Unremarkable. Adrenals/Urinary  Tract: Stable adrenal glands and kidneys. No urinary calculi or hydronephrosis. The bladder is obscured by streak artifact. Stomach/Bowel: Stomach is within normal limits. No bowel obstruction or bowel wall thickening. Vascular/Lymphatic: Aortic atherosclerotic calcification. New enlarged conglomerate lymphadenopathy about the aorta and IVC extending along the right iliac vessels. This measures 3.1 x 8.8 cm in the axial plane on series 2/image 46. Retrocrural node measuring 1.6 cm (series 2/image 21). 1.2 cm porta hepatis node on series 2/image 30. 1.4 cm central mesenteric node on series 2/image 54. Right inguinal lymphadenopathy measuring up to 1.5 cm (series 2/image 84). The adenopathy compresses the inferior IVC and right common iliac vein. Reproductive: Hysterectomy.  Streak artifact limits assessment. Other: Smaller free  fluid in the pelvis. No free intraperitoneal air. Musculoskeletal: Bilateral THA. No acute fracture or destructive osseous lesion. IMPRESSION: 1. New enlarged conglomerate lymphadenopathy about the aorta and IVC extending along the right iliac vessels. Additional enlarged retrocrural, porta hepatis, central mesenteric, and right inguinal lymph nodes. Findings are consistent with progression of lymphoma. Aortic Atherosclerosis (ICD10-I70.0). Electronically Signed   By: Minerva Fester M.D.   On: 08/01/2023 00:13    ASSESSMENT & PLAN:  72 y.o. female with:  1. Relapsed 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 Plan -Results of excisional lymph node biopsy showing peripheral T-cell lymphoma not otherwise specified which is CD30 negative were discussed in detail with the patient. PET CT scan was previously reviewed and she appears to have at least stage II disease but depending on the liver lesion etiology this could represent stage IV disease. She has had anthracycline exposure as a part of her AC regimen for previous treatment of breast cancer and is therefore limited in  her total lifetime anthracycline use. CD 30 negative status precludes use of Adcentris.   2. Anal Squamous Cell Carcinoma, cT2N0M0-currently in remission.  Details as noted above   3. 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 Completed tamoxifen 09/2007 - 09/2012 -2008: stage pT2N0 left breast invasive ductal carcinoma, triple negative, grade 3; s/p lumpectomy in 07/2007 and bilateral mastectomy 11/2007 (path negative for residual carcinoma in both breasts); s/p adjuvant chemo AC-T and radiation   4. H/o Chronic diarrhea -- follows with GI   5. 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 Extensive previous exposure to chemotherapy.  PLAN:  -Discussed lab results on 08/01/23 in detail with patient. CBC showed WBC of 5.4K, hemoglobin of 10.3, and platelets of 137K. -Discussed results of CT abdomen pelvis scan from yesterday, which showed " New enlarged conglomerate lymphadenopathy about the aorta and IVC extending along the right iliac vessels. Additional enlarged retrocrural, porta hepatis, central mesenteric, and right inguinal lymph nodes. Findings are consistent with progression of lymphoma." -will order high-dose steroids to hopefully improve her disomformt -discussed option of single agent palliative chemotherapy, Bendamustine, which patient is agreeable with. Discussed that there may be a role to start Bendamustine as inpatient if her liver and kidney functions allow and she still continues to have pain despite steroids. -She is scheduled for PET scan next week as well as to see Dr. Alphonsa Gin  at [facility] for consideration of CAR-T cell and other therapies. -Alternatively could start her on Duvelisib after using Bendamustine for bridging therapies. -will try enema to treat constipation -Did discuss goals of care in detail with the patient and she is inclined to continue palliative treatments and  understands likelihood of cure with her condition is low.  The total time spent in the appointment was 50 minutes* .  All of the patient's questions were answered with apparent satisfaction. The patient knows to call the clinic with any problems, questions or concerns.   Wyvonnia Lora MD MS AAHIVMS [facility] Hematology/Oncology Physician [facility]  .*Total Encounter Time as defined by the Centers for Medicare and Medicaid Services includes, in addition to the face-to-face time of a patient visit (documented in the note above) non-face-to-face time: obtaining and reviewing outside history, ordering and reviewing medications, tests or procedures, care coordination (communications with other health care professionals or caregivers) and documentation in the medical record.    I,Mitra Faeizi,acting as  a scribe for No name on file.,have documented all relevant documentation on the behalf of No name on file,as directed by  No name on file while in the presence of No name on file.  .I have reviewed the above documentation for accuracy and completeness, and I agree with the above. Wyvonnia Lora MD

## 2023-08-01 NOTE — Plan of Care (Signed)

## 2023-08-01 NOTE — Progress Notes (Signed)
PROGRESS NOTE    Cynthia Velasquez  JYN:[id number] DOB: 1950/10/03 DOA: 07/31/2023 PCP: [name], MD   Brief Narrative: 72 year old with past medical history significant for non-Hodgkin lymphoma, essential hypertension, cholecystectomy admitted to [facility] on 11/14 with suspected extrinsic biliary obstruction.  Patient presents with abdominal pain, generalized, sharp.  Associated with nausea decreased oral intake.  She presented with AKI with a creatinine of 1.7 compared to 1.6 06/2023, transaminases alkaline phosphatase 307, total bilirubin 2.0, AST 298, ALT 135, lipase 24.  White blood cell count 4.8.  CT abdomen and pelvis shows new enlarge conglomerate lymphadenopathy about the aorta, IVC, right iliac vessels, porta hepatis, consistent with progression of lymphoma, status postcholecystectomy, unremarkable appearing liver, biliary tree and pancreas.  No evidence of bowel obstruction perforation or abscess.   Assessment & Plan:   Principal Problem:   Biliary obstruction Active Problems:   Hypokalemia   Essential hypertension   T-cell lymphoma (HCC)   Transaminitis   Generalized abdominal pain   AKI (acute kidney injury) (HCC)   Depression   GAD (generalized anxiety disorder)  1-Transaminases Concern for biliary obstruction CT new enlarge conglomerate lymphadenopathy about the aorta, IVC, right iliac vessels, porta hepatis, consistent with progression of lymphoma, status postcholecystectomy, unremarkable appearing liver, biliary tree and pancreas.  Evaluated by GI, Dr Doretha Imus who doesn't think patient has significant biliary dilation from porta hepatis lymph nodes. No need for ERCP at this time. LFT also trending down.  Think transaminases related to worsening lymphoma.  If worsening LFT could consider MRCP.   Non-Hodgkin lymphoma: -LDH 5933 -Dr Candise Che Consulted.  -Patient started on IV solumedrol high dose.   Generalized abdominal pain: Related to lymphoma.    AKI -Presents with a creatinine of 1.7, prior creatinine level 1.1. Suspect related to hypovolemia in the setting of poor oral intake. -Continue with IV fluids.   Hypokalemia: -Replete IV  Hypomagnesemia: Replete IV  Thrombocytopenia; monitor.   Essential hypertension: Hold Cozaar in setting of AKI  Depression/GAD: Not taking  Cymbalta    Estimated body mass index is 31.12 kg/m as calculated from the following:   Height as of this encounter: 5\' 5"  (1.651 m).   Weight as of this encounter: 84.8 kg.   DVT prophylaxis: SCD Code Status: Full code Family Communication: Care discussed with patient.  Disposition Plan:  Status is: Inpatient Remains inpatient appropriate because: Lymphoma   Consultants:  Dr Cleotilde Neer, Deboraha Sprang.   Procedures:  None  Antimicrobials:    Subjective: She is alert, report improvement of abdominal pain, asking for diet.    Objective: Vitals:   07/31/23 2340 08/01/23 0030 08/01/23 0335 08/01/23 0356  BP:  136/87  (!) 144/82  Pulse:  93  80  Resp:  19    Temp: 98.8 F (37.1 C)  98.2 F (36.8 C) 98.4 F (36.9 C)  TempSrc: Oral  Oral   SpO2:  94%  92%  Weight:      Height:        Intake/Output Summary (Last 24 hours) at 08/01/2023 0751 Last data filed at 08/01/2023 0500 Gross per 24 hour  Intake 1167.84 ml  Output --  Net 1167.84 ml   Filed Weights   07/31/23 1916  Weight: 84.8 kg    Examination:  General exam: Appears calm and comfortable  Respiratory system: Clear to auscultation. Respiratory effort normal. Cardiovascular system: S1 & S2 heard, RRR. No JVD, murmurs, rubs, gallops or clicks. No pedal edema. Gastrointestinal system: Abdomen is nondistended, soft and nontender. No  organomegaly or masses felt. Normal bowel sounds heard. Central nervous system: Alert and oriented. No focal neurological deficits. Extremities: Symmetric 5 x 5 power.    Data Reviewed: I have personally reviewed following labs and imaging  studies  CBC: Recent Labs  Lab 07/31/23 1917 08/01/23 0400  WBC 4.8 5.4  NEUTROABS  --  3.6  HGB 10.8* 10.3*  HCT 33.3* 32.8*  MCV 85.8 88.6  PLT 131* 137*   Basic Metabolic Panel: Recent Labs  Lab 07/31/23 1917 08/01/23 0400  NA 140 140  K 3.1* 3.1*  CL 109 110  CO2 19* 19*  GLUCOSE 88 81  BUN 22 21  CREATININE 1.71* 1.86*  CALCIUM 9.6 9.1  MG 1.4* 1.5*   GFR: Estimated Creatinine Clearance: 29.8 mL/min (A) (by C-G formula based on SCr of 1.86 mg/dL (H)). Liver Function Tests: Recent Labs  Lab 07/31/23 1917 08/01/23 0400  AST 298* 284*  ALT 135* 124*  ALKPHOS 307* 289*  BILITOT 2.0* 2.0*  PROT 5.8* 5.3*  ALBUMIN 3.2* 2.9*   Recent Labs  Lab 07/31/23 1917  LIPASE 24   No results for input(s): "AMMONIA" in the last 168 hours. Coagulation Profile: Recent Labs  Lab 08/01/23 0400  INR 1.2   Cardiac Enzymes: No results for input(s): "CKTOTAL", "CKMB", "CKMBINDEX", "TROPONINI" in the last 168 hours. BNP (last 3 results) No results for input(s): "PROBNP" in the last 8760 hours. HbA1C: No results for input(s): "HGBA1C" in the last 72 hours. CBG: No results for input(s): "GLUCAP" in the last 168 hours. Lipid Profile: No results for input(s): "CHOL", "HDL", "LDLCALC", "TRIG", "CHOLHDL", "LDLDIRECT" in the last 72 hours. Thyroid Function Tests: No results for input(s): "TSH", "T4TOTAL", "FREET4", "T3FREE", "THYROIDAB" in the last 72 hours. Anemia Panel: No results for input(s): "VITAMINB12", "FOLATE", "FERRITIN", "TIBC", "IRON", "RETICCTPCT" in the last 72 hours. Sepsis Labs: No results for input(s): "PROCALCITON", "LATICACIDVEN" in the last 168 hours.  No results found for this or any previous visit (from the past 240 hour(s)).       Radiology Studies: CT ABDOMEN PELVIS W CONTRAST  Result Date: 08/01/2023 CLINICAL DATA:  Nausea and decreased p.o. intake.  Abdominal pain. EXAM: CT ABDOMEN AND PELVIS WITH CONTRAST TECHNIQUE: Multidetector CT  imaging of the abdomen and pelvis was performed using the standard protocol following bolus administration of intravenous contrast. RADIATION DOSE REDUCTION: This exam was performed according to the departmental dose-optimization program which includes automated exposure control, adjustment of the mA and/or kV according to patient size and/or use of iterative reconstruction technique. CONTRAST:  80mL OMNIPAQUE IOHEXOL 300 MG/ML  SOLN COMPARISON:  CT abdomen and pelvis 10/29/2022 and PET/CT 05/09/2023 FINDINGS: Lower chest: No acute abnormality. Hepatobiliary: Cholecystectomy. Unremarkable liver and biliary tree. Pancreas: Unremarkable. Spleen: Unremarkable. Adrenals/Urinary Tract: Stable adrenal glands and kidneys. No urinary calculi or hydronephrosis. The bladder is obscured by streak artifact. Stomach/Bowel: Stomach is within normal limits. No bowel obstruction or bowel wall thickening. Vascular/Lymphatic: Aortic atherosclerotic calcification. New enlarged conglomerate lymphadenopathy about the aorta and IVC extending along the right iliac vessels. This measures 3.1 x 8.8 cm in the axial plane on series 2/image 46. Retrocrural node measuring 1.6 cm (series 2/image 21). 1.2 cm porta hepatis node on series 2/image 30. 1.4 cm central mesenteric node on series 2/image 54. Right inguinal lymphadenopathy measuring up to 1.5 cm (series 2/image 84). The adenopathy compresses the inferior IVC and right common iliac vein. Reproductive: Hysterectomy.  Streak artifact limits assessment. Other: Smaller free fluid in the pelvis. No free  intraperitoneal air. Musculoskeletal: Bilateral THA. No acute fracture or destructive osseous lesion. IMPRESSION: 1. New enlarged conglomerate lymphadenopathy about the aorta and IVC extending along the right iliac vessels. Additional enlarged retrocrural, porta hepatis, central mesenteric, and right inguinal lymph nodes. Findings are consistent with progression of lymphoma. Aortic  Atherosclerosis (ICD10-I70.0). Electronically Signed   By: Minerva Fester M.D.   On: 08/01/2023 00:13        Scheduled Meds:  Chlorhexidine Gluconate Cloth  6 each Topical Daily   sodium chloride flush  10-40 mL Intracatheter Q12H   Continuous Infusions:  lactated ringers 1,000 mL with potassium chloride 10 mEq infusion 100 mL/hr at 08/01/23 0412   magnesium sulfate bolus IVPB     potassium chloride       LOS: 0 days    Time spent: 35 minutes.     Alba Cory, MD [facility]   If 7PM-7AM, please contact night-coverage [email or web address]  08/01/2023, 7:51 AM

## 2023-08-01 NOTE — ED Provider Notes (Signed)
I assumed care of this patient from previous provider.  Please see their note for further details of history, exam, and MDM.   Briefly patient is a 72 y.o. female who presented with a history of lymphoma here for abdominal pain liver times noted by PCP.  Confirmed transaminitis with evidence of possible biliary obstruction.  Currently awaiting CT scan  CT scan revealed progression of lymphoma with lymphadenopathy about the aorta/IVC as well as porta hepatis.  Biliary tract was noted to be normal however there could be some mass effect on the biliary tract.  Will admit patient to medicine and have GI evaluate patient for likely MRCP.  I spoke with Dr. Arlean Hopping, from the hospitalist service who agreed to admit patient for further workup and management.  Direct message to Dr. Levora Angel, from [facility] GI sent.       Nira Conn, MD 08/01/23 [phone]

## 2023-08-01 NOTE — ED Notes (Signed)
Pt was wheel chaired to restroom and back to bed.

## 2023-08-01 NOTE — Progress Notes (Signed)
   08/01/23 1215  TOC Brief Assessment  Insurance and Status Reviewed  Patient has primary care physician Yes  Home environment has been reviewed Apartment  Prior level of function: Independent/modified independent  Prior/Current Home Services No current home services  Social Determinants of Health Reivew SDOH reviewed no interventions necessary  Readmission risk has been reviewed Yes  Transition of care needs no transition of care needs at this time

## 2023-08-01 NOTE — Progress Notes (Addendum)
Soap suds enema performed. Pt was able to have large brown BM, solid/mushy consistency. Peri care performed by pt.  UA sent.

## 2023-08-01 NOTE — Consult Note (Signed)
Referring Provider: [facility] Primary Care Physician:  [name], MD Primary Gastroenterologist:  Dr. Ewing Schlein  Reason for Consultation:    HPI: Cynthia Velasquez is a 72 y.o. female with past medical history of breast cancer, diagnosed in 2008 status post bilateral mastectomy, chemo and radiation, history of anorectal cancer s/p radiation in 2020, history of non-Hodgkin's lymphoma diagnosed in October 2023 was on chemo, presented to the hospital with abdominal pain of 2 weeks duration along with nausea and decreased oral intake.  Upon initial evaluation yesterday she was found to have abnormal LFTs with AST of 298, ALT 135, alk phos 307, T. bili of 2.0, normal lipase, hemoglobin of 10.8, platelet 131, creatinine 1.7, normal INR. CT abdomen pelvis with IV contrast showed progressive lymphoma with multiple new lesions including 1.2 cm porta hepatis nodes.  GI is consulted for further evaluation.  Patient seen and examined at bedside.  She is complaining of bilateral upper quadrant discomfort as well as generalized abdominal bloating.  Denies any diarrhea or constipation.  Denies any blood in the stool or black stool.  Past Medical History:  Diagnosis Date   Anal cancer (HCC) dx'd 05/2019   Anxiety    Arthritis    Bilateral breast cancer (HCC) 10/21/2013   Breast cancer (HCC)    Cancer (HCC)    Depression    Gallstones    GERD (gastroesophageal reflux disease)    doesn't take any meds for this   Headache    h/o migraines       History of bladder infections    History of hiatal hernia    History of migraine    Hyperlipidemia    takes Simvastatin daily   Hypertension    takes Hyzaar   Joint pain    Joint swelling    Lumbar stenosis    Lymphoma (HCC)    NHL   Neuropathy 09/07/2013   Pneumonia    Pre-diabetes     Past Surgical History:  Procedure Laterality Date   ABDOMINAL HYSTERECTOMY     bone spur removed from left foot     CHOLECYSTECTOMY     COLONOSCOPY     COLONOSCOPY WITH  PROPOFOL Bilateral 12/07/2021   Procedure: COLONOSCOPY WITH PROPOFOL;  Surgeon: Vida Rigger, MD;  Location: [facility];  Service: Endoscopy;  Laterality: Bilateral;   double mastectomy      ESOPHAGOGASTRODUODENOSCOPY N/A 12/07/2021   Procedure: ESOPHAGOGASTRODUODENOSCOPY (EGD);  Surgeon: Vida Rigger, MD;  Location: [facility];  Service: Endoscopy;  Laterality: N/A;   HERNIA REPAIR     umbilical   IR IMAGING GUIDED PORT INSERTION  06/25/2022   right knee arthroscopy     right shoulder arthroscopy     surgery for hiatal hernia     TONSILLECTOMY     TOTAL HIP ARTHROPLASTY Left 02/12/2013   TOTAL HIP ARTHROPLASTY Left 02/12/2013   Procedure: LEFT TOTAL HIP ARTHROPLASTY;  Surgeon: Nestor Lewandowsky, MD;  Location: [facility] OR;  Service: Orthopedics;  Laterality: Left;   TOTAL HIP ARTHROPLASTY Right 05/03/2013   Procedure: TOTAL HIP ARTHROPLASTY;  Surgeon: Nestor Lewandowsky, MD;  Location: [facility] OR;  Service: Orthopedics;  Laterality: Right;   TOTAL KNEE ARTHROPLASTY Right 02/27/2015   Procedure: TOTAL KNEE ARTHROPLASTY;  Surgeon: Gean Birchwood, MD;  Location: [facility] OR;  Service: Orthopedics;  Laterality: Right;   TOTAL SHOULDER ARTHROPLASTY Right 09/12/2016   Procedure: RIGHT TOTAL SHOULDER ARTHROPLASTY;  Surgeon: Jones Broom, MD;  Location: [facility] OR;  Service: Orthopedics;  Laterality: Right;  RIGHT TOTAL SHOULDER  ARTHROPLASTY    Prior to Admission medications   Medication Sig Start Date End Date Taking? Authorizing Provider  aspirin-acetaminophen-caffeine (EXCEDRIN MIGRAINE) [phone] MG tablet Take 2 tablets by mouth every 6 (six) hours as needed for migraine.   Yes [provider]  losartan (COZAAR) 100 MG tablet Take 100 mg by mouth daily. 01/14/17  Yes [provider]  magic mouthwash (multi-ingredient) oral suspension Take 5 mLs by mouth 3 (three) times daily as needed. 06/27/23  Yes [name], MD  magic mouthwash w/lidocaine SOLN Take 5 mLs by mouth 3 (three) times daily as needed  for mouth pain. Suspension contains total volume 80 ml of distilled water 80ml of maalox 80ml of 2% viscous lidocaine 80ml of nystatin at 500000 units per 5 ml 80ml of diphenhydramine at 12.5mg /80ml 06/27/23  Yes [name], MD  methocarbamol (ROBAXIN) 750 MG tablet Take 1 tablet (750 mg total) by mouth every 8 (eight) hours as needed for muscle spasms. 01/08/23  Yes [name], MD  naloxone [facility]) nasal spray 4 mg/0.1 mL 1 squirt in the naris for unresponsiveness 06/19/23  Yes Melene Plan, DO  Nystatin (GERHARDT'S BUTT CREAM) CREA Apply 1 Application topically 2 (two) times daily. Patient taking differently: Apply 1 Application topically 2 (two) times daily as needed for irritation. 11/05/22  Yes Drema Dallas, MD  nystatin (MYCOSTATIN/NYSTOP) powder Apply 1 Application topically 3 (three) times daily. Patient taking differently: Apply 1 Application topically 3 (three) times daily as needed (redness, rash). 08/30/22  Yes Walisiewicz, Kaitlyn E, PA-C  ondansetron (ZOFRAN) 8 MG tablet Take 1 tablet (8 mg total) by mouth every 8 (eight) hours as needed for nausea or vomiting. 02/07/23  Yes [name], MD  prochlorperazine (COMPAZINE) 10 MG tablet Take 1 tablet (10 mg total) by mouth every 6 (six) hours as needed for nausea or vomiting. 02/07/23  Yes [name], MD  sucralfate (CARAFATE) 1 g tablet Dissolve 1 tablet in 5-10ml of water twice a day and then swallow slurry. 06/27/23  Yes [name], MD  triamcinolone ointment (KENALOG) 0.5 % Apply 1 Application topically 2 (two) times daily. To rash under both armpits Patient taking differently: Apply 1 Application topically 2 (two) times daily as needed (Rash). To rash under both armpits 05/20/23  Yes [name], MD  DULoxetine (CYMBALTA) 60 MG capsule Take 1 capsule (60 mg total) by mouth 2 (two) times daily. Take 1 capsule (30 mg) at bedtime for 1 week, if no side effects, then increase to 2  capsules (60 mg) thereafter. Patient not taking: Reported on 08/01/2023 12/20/22   Antony Madura, MD  fluconazole (DIFLUCAN) 200 MG tablet Take 1 tablet (200 mg total) by mouth daily. Patient not taking: Reported on 08/01/2023 06/27/23   [name], MD  fluconazole (DIFLUCAN) 200 MG tablet Take 1 tablet (200 mg total) by mouth daily. Patient not taking: Reported on 08/01/2023 06/27/23   [name], MD  pantoprazole (PROTONIX) 40 MG tablet Take 1 tablet (40 mg total) by mouth daily. Patient not taking: Reported on 08/01/2023 11/06/22   Drema Dallas, MD  potassium chloride SA (KLOR-CON M) 20 MEQ tablet Take 1 tablet (20 mEq total) by mouth 2 (two) times daily. Patient not taking: Reported on 08/01/2023 06/27/23   [name], MD    Scheduled Meds:  Chlorhexidine Gluconate Cloth  6 each Topical Daily   sodium chloride flush  10-40 mL Intracatheter Q12H   Continuous  Infusions:  lactated ringers 1,000 mL with potassium chloride 10 mEq infusion 100 mL/hr at 08/01/23 0412   potassium chloride     PRN Meds:.acetaminophen **OR** acetaminophen, HYDROmorphone (DILAUDID) injection, naLOXone (NARCAN)  injection, ondansetron (ZOFRAN) IV, sodium chloride flush  Allergies as of 07/31/2023 - Review Complete 07/31/2023  Allergen Reaction Noted   Tramadol Itching and Rash 06/10/2022   Belinostat Nausea And Vomiting 02/24/2023   Codeine Itching and Rash 02/05/2013   Vicodin [hydrocodone-acetaminophen] Itching and Rash 06/29/2012    Family History  Adopted: Yes  Problem Relation Age of Onset   Allergic rhinitis Neg Hx    Angioedema Neg Hx    Atopy Neg Hx    Eczema Neg Hx    Immunodeficiency Neg Hx    Urticaria Neg Hx    Asthma Neg Hx     Social History   Socioeconomic History   Marital status: Single    Spouse name: Not on file   Number of children: 1   Years of education: Not on file   Highest education level: Not on file  Occupational History   Not on  file  Tobacco Use   Smoking status: Never   Smokeless tobacco: Never  Vaping Use   Vaping status: Never Used  Substance and Sexual Activity   Alcohol use: No    Comment: occ   Drug use: No   Sexual activity: Not Currently  Other Topics Concern   Not on file  Social History Narrative   Are you right handed or left handed? Right   Are you currently employed ? no   What is your current occupation?retired   Do you live at home alone? no   Who lives with you? Daughter and granddaughter   What type of home do you live in: 1 story or 2 story? one   Caffeine 1 cup a day   Social Determinants of Health   Financial Resource Strain: Not on file  Food Insecurity: No Food Insecurity (08/01/2023)   Hunger Vital Sign    Worried About Running Out of Food in the Last Year: Never true    Ran Out of Food in the Last Year: Never true  Transportation Needs: No Transportation Needs (08/01/2023)   PRAPARE - Administrator, Civil Service (Medical): No    Lack of Transportation (Non-Medical): No  Physical Activity: Not on file  Stress: Not on file  Social Connections: Unknown (01/29/2022)   Received from [facility]   Social Network    Social Network: Not on file  Intimate Partner Violence: Not At Risk (08/01/2023)   Humiliation, Afraid, Rape, and Kick questionnaire    Fear of Current or Ex-Partner: No    Emotionally Abused: No    Physically Abused: No    Sexually Abused: No    Review of Systems: All negative except as stated above in HPI.  Physical Exam: Vital signs: Vitals:   08/01/23 0356 08/01/23 0825  BP: (!) 144/82 125/72  Pulse: 80 80  Resp:  18  Temp: 98.4 F (36.9 C) 97.9 F (36.6 C)  SpO2: 92% 93%   Last BM Date : 07/18/23 General:   Alert,  Well-developed, well-nourished, pleasant and cooperative in NAD Lungs: No visible respiratory distress Heart: Irregular heart rate Abdomen: Bilateral upper quadrant tenderness to palpation, no rebound,  abdomen is soft, bowel sound present, no peritoneal signs Mood and affect normal Alert and oriented x 3   GI:  Lab Results: Recent Labs  07/31/23 1917 08/01/23 0400  WBC 4.8 5.4  HGB 10.8* 10.3*  HCT 33.3* 32.8*  PLT 131* 137*   BMET Recent Labs    07/31/23 1917 08/01/23 0400  NA 140 140  K 3.1* 3.1*  CL 109 110  CO2 19* 19*  GLUCOSE 88 81  BUN 22 21  CREATININE 1.71* 1.86*  CALCIUM 9.6 9.1   LFT Recent Labs    08/01/23 0400  PROT 5.3*  ALBUMIN 2.9*  AST 284*  ALT 124*  ALKPHOS 289*  BILITOT 2.0*  BILIDIR 0.9*   PT/INR Recent Labs    08/01/23 0400  LABPROT 14.9  INR 1.2     Studies/Results: CT ABDOMEN PELVIS W CONTRAST  Result Date: 08/01/2023 CLINICAL DATA:  Nausea and decreased p.o. intake.  Abdominal pain. EXAM: CT ABDOMEN AND PELVIS WITH CONTRAST TECHNIQUE: Multidetector CT imaging of the abdomen and pelvis was performed using the standard protocol following bolus administration of intravenous contrast. RADIATION DOSE REDUCTION: This exam was performed according to the departmental dose-optimization program which includes automated exposure control, adjustment of the mA and/or kV according to patient size and/or use of iterative reconstruction technique. CONTRAST:  80mL OMNIPAQUE IOHEXOL 300 MG/ML  SOLN COMPARISON:  CT abdomen and pelvis 10/29/2022 and PET/CT 05/09/2023 FINDINGS: Lower chest: No acute abnormality. Hepatobiliary: Cholecystectomy. Unremarkable liver and biliary tree. Pancreas: Unremarkable. Spleen: Unremarkable. Adrenals/Urinary Tract: Stable adrenal glands and kidneys. No urinary calculi or hydronephrosis. The bladder is obscured by streak artifact. Stomach/Bowel: Stomach is within normal limits. No bowel obstruction or bowel wall thickening. Vascular/Lymphatic: Aortic atherosclerotic calcification. New enlarged conglomerate lymphadenopathy about the aorta and IVC extending along the right iliac vessels. This measures 3.1 x 8.8 cm in the  axial plane on series 2/image 46. Retrocrural node measuring 1.6 cm (series 2/image 21). 1.2 cm porta hepatis node on series 2/image 30. 1.4 cm central mesenteric node on series 2/image 54. Right inguinal lymphadenopathy measuring up to 1.5 cm (series 2/image 84). The adenopathy compresses the inferior IVC and right common iliac vein. Reproductive: Hysterectomy.  Streak artifact limits assessment. Other: Smaller free fluid in the pelvis. No free intraperitoneal air. Musculoskeletal: Bilateral THA. No acute fracture or destructive osseous lesion. IMPRESSION: 1. New enlarged conglomerate lymphadenopathy about the aorta and IVC extending along the right iliac vessels. Additional enlarged retrocrural, porta hepatis, central mesenteric, and right inguinal lymph nodes. Findings are consistent with progression of lymphoma. Aortic Atherosclerosis (ICD10-I70.0). Electronically Signed   By: Minerva Fester M.D.   On: 08/01/2023 00:13    Impression/Plan: -Abnormal LFTs in a patient with progressive non-Hodgkin's lymphoma.  Recent CT showed 1.2 cm lymph node in porta hepatis.  No significant biliary dilation.  Total bilirubin of only 2 with direct bilirubin of 0.9. -History of anal cancer as well as breast cancer in the past.  Recommendations --------------------------- -Patient's LFTs are already improving.  She may get worsening of lymphadenopathy with progressive non-Hodgkin's lymphoma and may get enlarged lymph node in porta hepatis in the future but at this time it is not causing any significant biliary dilation and hence do not recommend any ERCP at this time. -If worsening LFTs, consider MRI MRCP for further evaluation prior to committing the patient for ERCP with stent placement. -Recommend close monitoring of LFTs with PCP and oncology. -No further inpatient GI workup planned at this time.  GI will sign off.  Follow-up with primary GI Dr. Ewing Schlein in 6 to 8 weeks after discharge.      LOS: 0 days  Kathi Der  MD, FACP 08/01/2023, 10:05 AM  Contact #  [phone]

## 2023-08-01 NOTE — Progress Notes (Signed)
Oncology short note  Patient well know to [name] with recurrent/relapsing T cell NHL. Admitted with abdominal pain and abnormal LFTs. CT abd with significant abdominal LNadenopathy progression. Also with severe constipation with no BMs for 2 weeks which could be adding to pain.  Plan -soap suds enema -CT abd reviewed. No biliary obstruction. GI with no recommendations for ERCP -started on Solumedrol 175m q12h to try to shrink the LNadenopathy and help with liver function. -if symptoms controlled and lfts improving may discharge patient. -will setup outpatient Bendamustine as bridge treatment. -has outpatient pet/ct and appointment with Dr Alphonsa Gin at [facility] to evaluate for subsequent treatment options. -ocnology will continue to follow  Wyvonnia Lora MD MS

## 2023-08-01 NOTE — ED Notes (Signed)
ED TO INPATIENT HANDOFF REPORT  Name/Age/Gender Cynthia Velasquez 72 y.o. female  Code Status    Code Status Orders  (From admission, onward)           Start     Ordered   08/01/23 0157  Full code  Continuous       Question:  By:  Answer:  Consent: discussion documented in EHR   08/01/23 0156           Code Status History     Date Active Date Inactive Code Status Order ID Comments User Context   01/21/2023 1346 01/22/2023 0514 Full Code [id number]  Simonne Come, MD HOV   12/29/2022 0423 12/30/2022 2105 Full Code [id number]  Carollee Herter, DO ED   12/29/2022 0407 12/29/2022 0423 Full Code [id number]  Carollee Herter, DO ED   10/28/2022 1552 11/05/2022 2058 Full Code [id number]  Bobette Mo, MD ED   09/12/2016 1136 09/13/2016 1836 Full Code [id number]  Jiles Harold, PA-C Inpatient   02/27/2015 1117 03/01/2015 1919 Full Code [id number]  Gean Birchwood, MD Inpatient       Home/SNF/Other Home  Chief Complaint Biliary obstruction [K83.1]  Level of Care/Admitting Diagnosis ED Disposition     ED Disposition  Admit   Condition  --   Comment  Hospital Area: [facility] [[id number]]  Level of Care: Telemetry [5]  Admit to tele based on following criteria: Monitor for Ischemic changes  May admit patient to [facility] or [facility] if equivalent level of care is available:: No  Covid Evaluation: Asymptomatic - no recent exposure (last 10 days) testing not required  Diagnosis: Biliary obstruction [536644]  Admitting Physician: Angie Fava [[id number]]  Attending Physician: Angie Fava [[id number]]  Certification:: I certify this patient will need inpatient services for at least 2 midnights  Expected Medical Readiness: 08/03/2023          Medical History Past Medical History:  Diagnosis Date   Anal cancer (HCC) dx'd 05/2019   Anxiety    Arthritis    Bilateral breast cancer (HCC) 10/21/2013   Breast cancer (HCC)    Cancer (HCC)    Depression     Gallstones    GERD (gastroesophageal reflux disease)    doesn't take any meds for this   Headache    h/o migraines       History of bladder infections    History of hiatal hernia    History of migraine    Hyperlipidemia    takes Simvastatin daily   Hypertension    takes Hyzaar   Joint pain    Joint swelling    Lumbar stenosis    Lymphoma (HCC)    NHL   Neuropathy 09/07/2013   Pneumonia    Pre-diabetes     Allergies Allergies  Allergen Reactions   Tramadol Itching and Rash   Belinostat Nausea And Vomiting    Headache and  nausea during infusion. See progress note from 02/24/23   Codeine Itching and Rash   Vicodin [Hydrocodone-Acetaminophen] Itching and Rash    IV Location/Drains/Wounds Patient Lines/Drains/Airways Status     Active Line/Drains/Airways     Name Placement date Placement time Site Days   Implanted Port 06/25/22 Right Chest 06/25/22  --  Chest  402            Labs/Imaging Results for orders placed or performed during the hospital encounter of 07/31/23 (from the past 48 hour(s))  Lipase, blood     Status: None  Collection Time: 07/31/23  7:17 PM  Result Value Ref Range   Lipase 24 11 - 51 U/L    Comment: Performed at [facility], [address] [place], [place] [address]  Comprehensive metabolic panel     Status: Abnormal   Collection Time: 07/31/23  7:17 PM  Result Value Ref Range   Sodium 140 135 - 145 mmol/L   Potassium 3.1 (L) 3.5 - 5.1 mmol/L   Chloride 109 98 - 111 mmol/L   CO2 19 (L) 22 - 32 mmol/L   Glucose, Bld 88 70 - 99 mg/dL    Comment: Glucose reference range applies only to samples taken after fasting for at least 8 hours.   BUN 22 8 - 23 mg/dL   Creatinine, Ser 8.93 (H) 0.44 - 1.00 mg/dL   Calcium 9.6 8.9 - 81.0 mg/dL   Total Protein 5.8 (L) 6.5 - 8.1 g/dL   Albumin 3.2 (L) 3.5 - 5.0 g/dL   AST 175 (H) 15 - 41 U/L   ALT 135 (H) 0 - 44 U/L   Alkaline Phosphatase 307 (H) 38 - 126 U/L   Total Bilirubin  2.0 (H) <1.2 mg/dL   GFR, Estimated 32 (L) >60 mL/min    Comment: (NOTE) Calculated using the CKD-EPI Creatinine Equation (2021)    Anion gap 12 5 - 15    Comment: Performed at [facility], [address], [place], [place] [address]  CBC     Status: Abnormal   Collection Time: 07/31/23  7:17 PM  Result Value Ref Range   WBC 4.8 4.0 - 10.5 K/uL   RBC 3.88 3.87 - 5.11 MIL/uL   Hemoglobin 10.8 (L) 12.0 - 15.0 g/dL   HCT 52.7 (L) 78.2 - 42.3 %   MCV 85.8 80.0 - 100.0 fL   MCH 27.8 26.0 - 34.0 pg   MCHC 32.4 30.0 - 36.0 g/dL   RDW 53.6 (H) 14.4 - 31.5 %   Platelets 131 (L) 150 - 400 K/uL   nRBC 0.0 0.0 - 0.2 %    Comment: Performed at [facility], [address], [place], [place] [address]  Magnesium     Status: Abnormal   Collection Time: 07/31/23  7:17 PM  Result Value Ref Range   Magnesium 1.4 (L) 1.7 - 2.4 mg/dL    Comment: Performed at [facility], [address] [place], [place] [address]   CT ABDOMEN PELVIS W CONTRAST  Result Date: 08/01/2023 CLINICAL DATA:  Nausea and decreased p.o. intake.  Abdominal pain. EXAM: CT ABDOMEN AND PELVIS WITH CONTRAST TECHNIQUE: Multidetector CT imaging of the abdomen and pelvis was performed using the standard protocol following bolus administration of intravenous contrast. RADIATION DOSE REDUCTION: This exam was performed according to the departmental dose-optimization program which includes automated exposure control, adjustment of the mA and/or kV according to patient size and/or use of iterative reconstruction technique. CONTRAST:  80mL OMNIPAQUE IOHEXOL 300 MG/ML  SOLN COMPARISON:  CT abdomen and pelvis 10/29/2022 and PET/CT 05/09/2023 FINDINGS: Lower chest: No acute abnormality. Hepatobiliary: Cholecystectomy. Unremarkable liver and biliary tree. Pancreas: Unremarkable. Spleen: Unremarkable. Adrenals/Urinary Tract: Stable adrenal glands and kidneys. No urinary calculi or hydronephrosis.  The bladder is obscured by streak artifact. Stomach/Bowel: Stomach is within normal limits. No bowel obstruction or bowel wall thickening. Vascular/Lymphatic: Aortic atherosclerotic calcification. New enlarged conglomerate lymphadenopathy about the aorta and IVC extending along the right iliac vessels. This measures 3.1 x 8.8 cm in the axial plane  on series 2/image 46. Retrocrural node measuring 1.6 cm (series 2/image 21). 1.2 cm porta hepatis node on series 2/image 30. 1.4 cm central mesenteric node on series 2/image 54. Right inguinal lymphadenopathy measuring up to 1.5 cm (series 2/image 84). The adenopathy compresses the inferior IVC and right common iliac vein. Reproductive: Hysterectomy.  Streak artifact limits assessment. Other: Smaller free fluid in the pelvis. No free intraperitoneal air. Musculoskeletal: Bilateral THA. No acute fracture or destructive osseous lesion. IMPRESSION: 1. New enlarged conglomerate lymphadenopathy about the aorta and IVC extending along the right iliac vessels. Additional enlarged retrocrural, porta hepatis, central mesenteric, and right inguinal lymph nodes. Findings are consistent with progression of lymphoma. Aortic Atherosclerosis (ICD10-I70.0). Electronically Signed   By: Minerva Fester M.D.   On: 08/01/2023 00:13    Pending Labs Unresulted Labs (From admission, onward)     Start     Ordered   08/01/23 0500  CBC with Differential/Platelet  Tomorrow morning,   R        08/01/23 0157   08/01/23 0500  Comprehensive metabolic panel  Tomorrow morning,   R        08/01/23 0157   08/01/23 0500  Magnesium  Tomorrow morning,   R        08/01/23 0157   08/01/23 0500  Bilirubin, direct  Tomorrow morning,   R        08/01/23 0215   08/01/23 0500  Lactate dehydrogenase  Tomorrow morning,   R        08/01/23 0215   08/01/23 0500  Protime-INR  Tomorrow morning,   R        08/01/23 0215   08/01/23 0500  Gamma GT  Tomorrow morning,   R        08/01/23 0215   08/01/23 0220   Sodium, urine, random  Add-on,   AD        08/01/23 0219   08/01/23 0220  Creatinine, urine, random  Add-on,   AD        08/01/23 0219   07/31/23 1917  Urinalysis, Routine w reflex microscopic -Urine, Clean Catch  Once,   URGENT       Question:  Specimen Source  Answer:  Urine, Clean Catch   07/31/23 1917            Vitals/Pain Today's Vitals   07/31/23 2230 07/31/23 2340 08/01/23 0030 08/01/23 0209  BP: (!) 142/83  136/87   Pulse: 94  93   Resp: 18  19   Temp:  98.8 F (37.1 C)    TempSrc:  Oral    SpO2: 95%  94%   Weight:      Height:      PainSc:    4     Isolation Precautions No active isolations  Medications Medications  sodium chloride flush (NS) 0.9 % injection 10-40 mL ( Intracatheter Not Given 07/31/23 2202)  sodium chloride flush (NS) 0.9 % injection 10-40 mL (has no administration in time range)  Chlorhexidine Gluconate Cloth 2 % PADS 6 each (6 each Topical Not Given 07/31/23 2202)  naloxone [facility]) injection 0.4 mg (has no administration in time range)  HYDROmorphone (DILAUDID) injection 0.5 mg (has no administration in time range)  acetaminophen (TYLENOL) tablet 650 mg (has no administration in time range)    Or  acetaminophen (TYLENOL) suppository 650 mg (has no administration in time range)  ondansetron (ZOFRAN) injection 4 mg (has no administration in time range)  lactated ringers 1,000  mL with potassium chloride 10 mEq infusion ( Intravenous New Bag/Given 08/01/23 0255)  iohexol (OMNIPAQUE) 300 MG/ML solution 80 mL (80 mLs Intravenous Contrast Given 07/31/23 2235)  sodium chloride 0.9 % bolus 1,000 mL (0 mLs Intravenous Stopped 08/01/23 0255)    Mobility walks with person assist

## 2023-08-02 ENCOUNTER — Encounter: Payer: Self-pay | Admitting: Hematology

## 2023-08-02 DIAGNOSIS — K831 Obstruction of bile duct: Secondary | ICD-10-CM | POA: Diagnosis not present

## 2023-08-02 DIAGNOSIS — R1084 Generalized abdominal pain: Secondary | ICD-10-CM

## 2023-08-02 DIAGNOSIS — C8590 Non-Hodgkin lymphoma, unspecified, unspecified site: Secondary | ICD-10-CM | POA: Diagnosis not present

## 2023-08-02 LAB — CBC
HCT: 33.3 % — ABNORMAL LOW (ref 36.0–46.0)
Hemoglobin: 10.4 g/dL — ABNORMAL LOW (ref 12.0–15.0)
MCH: 27.4 pg (ref 26.0–34.0)
MCHC: 31.2 g/dL (ref 30.0–36.0)
MCV: 87.6 fL (ref 80.0–100.0)
Platelets: 152 10*3/uL (ref 150–400)
RBC: 3.8 MIL/uL — ABNORMAL LOW (ref 3.87–5.11)
RDW: 21.6 % — ABNORMAL HIGH (ref 11.5–15.5)
WBC: 3.9 10*3/uL — ABNORMAL LOW (ref 4.0–10.5)
nRBC: 0.5 % — ABNORMAL HIGH (ref 0.0–0.2)

## 2023-08-02 LAB — COMPREHENSIVE METABOLIC PANEL
ALT: 109 U/L — ABNORMAL HIGH (ref 0–44)
AST: 246 U/L — ABNORMAL HIGH (ref 15–41)
Albumin: 2.9 g/dL — ABNORMAL LOW (ref 3.5–5.0)
Alkaline Phosphatase: 337 U/L — ABNORMAL HIGH (ref 38–126)
Anion gap: 9 (ref 5–15)
BUN: 21 mg/dL (ref 8–23)
CO2: 19 mmol/L — ABNORMAL LOW (ref 22–32)
Calcium: 8.7 mg/dL — ABNORMAL LOW (ref 8.9–10.3)
Chloride: 104 mmol/L (ref 98–111)
Creatinine, Ser: 1.55 mg/dL — ABNORMAL HIGH (ref 0.44–1.00)
GFR, Estimated: 36 mL/min — ABNORMAL LOW (ref >60)
Glucose, Bld: 210 mg/dL — ABNORMAL HIGH (ref 70–99)
Potassium: 3.5 mmol/L (ref 3.5–5.1)
Sodium: 132 mmol/L — ABNORMAL LOW (ref 135–145)
Total Bilirubin: 1.9 mg/dL — ABNORMAL HIGH (ref <1.2)
Total Protein: 5.3 g/dL — ABNORMAL LOW (ref 6.5–8.1)

## 2023-08-02 LAB — MAGNESIUM: Magnesium: 1.5 mg/dL — ABNORMAL LOW (ref 1.7–2.4)

## 2023-08-02 MED ORDER — LACTATED RINGERS IV SOLN: INTRAVENOUS | Status: DC

## 2023-08-02 MED ORDER — DEXAMETHASONE 4 MG PO TABS: 8.0000 mg | ORAL_TABLET | Freq: Every day | ORAL | 1 refills | Status: DC

## 2023-08-02 MED ORDER — LIDOCAINE-PRILOCAINE 2.5-2.5 % EX CREA: TOPICAL_CREAM | CUTANEOUS | 3 refills | Status: AC

## 2023-08-02 MED ORDER — SODIUM CHLORIDE 0.9 % IV SOLN
2.0000 g | INTRAVENOUS | Status: DC
  Administered 2023-08-02 – 2023-08-03 (×2): 2 g via INTRAVENOUS
  Filled 2023-08-02 (×2): qty 20

## 2023-08-02 MED ORDER — AMLODIPINE BESYLATE 5 MG PO TABS
5.0000 mg | ORAL_TABLET | Freq: Every day | ORAL | Status: DC
  Administered 2023-08-02 – 2023-08-03 (×2): 5 mg via ORAL
  Filled 2023-08-02 (×2): qty 1

## 2023-08-02 MED ORDER — MAGNESIUM SULFATE 2 GM/50ML IV SOLN
2.0000 g | Freq: Once | INTRAVENOUS | Status: AC
  Administered 2023-08-02: 2 g via INTRAVENOUS
  Filled 2023-08-02: qty 50

## 2023-08-02 MED ORDER — PROCHLORPERAZINE MALEATE 10 MG PO TABS: 10.0000 mg | ORAL_TABLET | Freq: Four times a day (QID) | ORAL | 1 refills | Status: AC | PRN

## 2023-08-02 MED ORDER — METHYLPREDNISOLONE SODIUM SUCC 125 MG IJ SOLR
125.0000 mg | Freq: Four times a day (QID) | INTRAMUSCULAR | Status: DC
Start: 2023-08-02 — End: 2023-08-03
  Administered 2023-08-02 – 2023-08-03 (×4): 125 mg via INTRAVENOUS
  Filled 2023-08-02 (×4): qty 2

## 2023-08-02 MED ORDER — ACYCLOVIR 400 MG PO TABS: 400.0000 mg | ORAL_TABLET | Freq: Every day | ORAL | 3 refills | Status: AC

## 2023-08-02 MED ORDER — MAGNESIUM SULFATE 2 GM/50ML IV SOLN: 2.0000 g | Freq: Once | INTRAVENOUS | Status: DC

## 2023-08-02 MED ORDER — ONDANSETRON HCL 8 MG PO TABS: 8.0000 mg | ORAL_TABLET | Freq: Three times a day (TID) | ORAL | 1 refills | Status: AC | PRN

## 2023-08-02 NOTE — Plan of Care (Signed)
  Problem: Education: Goal: Knowledge of General Education information will improve Description: Including pain rating scale, medication(s)/side effects and non-pharmacologic comfort measures 08/02/2023 0003 by Duffy Bruce, RN Outcome: Progressing 08/02/2023 0003 by Duffy Bruce, RN Outcome: Progressing   Problem: Activity: Goal: Risk for activity intolerance will decrease 08/02/2023 0003 by Duffy Bruce, RN Outcome: Progressing 08/02/2023 0003 by Duffy Bruce, RN Outcome: Progressing   Problem: Nutrition: Goal: Adequate nutrition will be maintained 08/02/2023 0003 by Duffy Bruce, RN Outcome: Progressing 08/02/2023 0003 by Duffy Bruce, RN Outcome: Progressing   Problem: Coping: Goal: Level of anxiety will decrease 08/02/2023 0003 by Duffy Bruce, RN Outcome: Progressing 08/02/2023 0003 by Duffy Bruce, RN Outcome: Progressing   Problem: Elimination: Goal: Will not experience complications related to bowel motility 08/02/2023 0003 by Duffy Bruce, RN Outcome: Progressing 08/02/2023 0003 by Duffy Bruce, RN Outcome: Progressing Goal: Will not experience complications related to urinary retention 08/02/2023 0003 by Duffy Bruce, RN Outcome: Progressing 08/02/2023 0003 by Duffy Bruce, RN Outcome: Progressing   Problem: Pain Management: Goal: General experience of comfort will improve 08/02/2023 0003 by Duffy Bruce, RN Outcome: Progressing 08/02/2023 0003 by Duffy Bruce, RN Outcome: Progressing   Problem: Safety: Goal: Ability to remain free from injury will improve 08/02/2023 0003 by Duffy Bruce, RN Outcome: Progressing 08/02/2023 0003 by Duffy Bruce, RN Outcome: Progressing

## 2023-08-02 NOTE — Progress Notes (Signed)
PROGRESS NOTE    Cynthia Velasquez  ZOX:[id number] DOB: December 08, 1950 DOA: 07/31/2023 PCP: [name], MD   Brief Narrative: 72 year old with past medical history significant for non-Hodgkin lymphoma, essential hypertension, cholecystectomy admitted to [facility] on 11/14 with suspected extrinsic biliary obstruction.  Patient presents with abdominal pain, generalized, sharp.  Associated with nausea decreased oral intake.  She presented with AKI with a creatinine of 1.7 compared to 1.6 06/2023, transaminases alkaline phosphatase 307, total bilirubin 2.0, AST 298, ALT 135, lipase 24.  White blood cell count 4.8.  CT abdomen and pelvis shows new enlarge conglomerate lymphadenopathy about the aorta, IVC, right iliac vessels, porta hepatis, consistent with progression of lymphoma, status postcholecystectomy, unremarkable appearing liver, biliary tree and pancreas.  No evidence of bowel obstruction perforation or abscess.   Assessment & Plan:   Principal Problem:   Biliary obstruction Active Problems:   Hypokalemia   Essential hypertension   T-cell lymphoma (HCC)   Transaminitis   Generalized abdominal pain   AKI (acute kidney injury) (HCC)   Depression   GAD (generalized anxiety disorder)  1-Transaminases CT new enlarge conglomerate lymphadenopathy about the aorta, IVC, right iliac vessels, porta hepatis, consistent with progression of lymphoma, status postcholecystectomy, unremarkable appearing liver, biliary tree and pancreas.  Evaluated by GI, Dr Doretha Imus who doesn't think patient has significant biliary dilation from porta hepatis lymph nodes. No need for ERCP at this time. LFT also trending down.  Think transaminases related to worsening lymphoma.  If worsening LFT could consider MRCP.  Plan to monitor LFT tomorrow, increase Alk phosphate today, but Bili and SLT/AST decreasing.   Non-Hodgkin lymphoma: Worsening.  -LDH 5933 -Dr Candise Che Consulted.  -Patient started on IV solumedrol high  dose.   Generalized abdominal pain: Related to lymphoma and constipation. Improved after smog enema Had BM.   Continue with Miralax.   AKI -Presents with a creatinine of 1.7, prior creatinine level 1.1. Suspect related to hypovolemia in the setting of poor oral intake. -Continue with IV fluids.   Hypokalemia: -Replete IV  Hypomagnesemia: Replete IV   Thrombocytopenia; monitor.   Essential hypertension: Hold Cozaar in setting of AKI Start Norvasc.   Depression/GAD: Not taking  Cymbalta    Estimated body mass index is 34.27 kg/m as calculated from the following:   Height as of this encounter: 5\' 5"  (1.651 m).   Weight as of this encounter: 93.4 kg.   DVT prophylaxis: SCD Code Status: Full code Family Communication: Care discussed with patient. And family at bedside.  Disposition Plan:  Status is: Inpatient Remains inpatient appropriate because: Lymphoma   Consultants:  Dr Cleotilde Neer, Deboraha Sprang.   Procedures:  None  Antimicrobials:    Subjective: She is alert, report abdominal pain is better. Had large BM yesterday.    Objective: Vitals:   08/02/23 0457 08/02/23 0514 08/02/23 0849 08/02/23 1336  BP: (!) 177/96 (!) 158/97 (!) 161/99 (!) 153/93  Pulse: 70 69 73 76  Resp: 20  20 18   Temp: 97.8 F (36.6 C)  98 F (36.7 C) 98.5 F (36.9 C)  TempSrc: Oral  Oral   SpO2: 94%  94% 99%  Weight:  93.4 kg    Height:        Intake/Output Summary (Last 24 hours) at 08/02/2023 1626 Last data filed at 08/02/2023 1500 Gross per 24 hour  Intake 1964.57 ml  Output 525 ml  Net 1439.57 ml   Filed Weights   07/31/23 1916 08/02/23 0514  Weight: 84.8 kg 93.4 kg  Examination:  General exam: NAD Respiratory system: CTA Cardiovascular system: S 1, S 2 RRR Gastrointestinal system: BS present, soft nt Central nervous system: non focal.  Extremities: no edema    Data Reviewed: I have personally reviewed following labs and imaging studies  CBC: Recent Labs   Lab 07/31/23 1917 08/01/23 0400 08/02/23 0218  WBC 4.8 5.4 3.9*  NEUTROABS  --  3.6  --   HGB 10.8* 10.3* 10.4*  HCT 33.3* 32.8* 33.3*  MCV 85.8 88.6 87.6  PLT 131* 137* 152   Basic Metabolic Panel: Recent Labs  Lab 07/31/23 1917 08/01/23 0400 08/02/23 0218 08/02/23 1550  NA 140 140 132*  --   K 3.1* 3.1* 3.5  --   CL 109 110 104  --   CO2 19* 19* 19*  --   GLUCOSE 88 81 210*  --   BUN 22 21 21   --   CREATININE 1.71* 1.86* 1.55*  --   CALCIUM 9.6 9.1 8.7*  --   MG 1.4* 1.5*  --  1.5*   GFR: Estimated Creatinine Clearance: 37.6 mL/min (A) (by C-G formula based on SCr of 1.55 mg/dL (H)). Liver Function Tests: Recent Labs  Lab 07/31/23 1917 08/01/23 0400 08/02/23 0218  AST 298* 284* 246*  ALT 135* 124* 109*  ALKPHOS 307* 289* 337*  BILITOT 2.0* 2.0* 1.9*  PROT 5.8* 5.3* 5.3*  ALBUMIN 3.2* 2.9* 2.9*   Recent Labs  Lab 07/31/23 1917  LIPASE 24   No results for input(s): "AMMONIA" in the last 168 hours. Coagulation Profile: Recent Labs  Lab 08/01/23 0400  INR 1.2   Cardiac Enzymes: No results for input(s): "CKTOTAL", "CKMB", "CKMBINDEX", "TROPONINI" in the last 168 hours. BNP (last 3 results) No results for input(s): "PROBNP" in the last 8760 hours. HbA1C: No results for input(s): "HGBA1C" in the last 72 hours. CBG: No results for input(s): "GLUCAP" in the last 168 hours. Lipid Profile: No results for input(s): "CHOL", "HDL", "LDLCALC", "TRIG", "CHOLHDL", "LDLDIRECT" in the last 72 hours. Thyroid Function Tests: No results for input(s): "TSH", "T4TOTAL", "FREET4", "T3FREE", "THYROIDAB" in the last 72 hours. Anemia Panel: No results for input(s): "VITAMINB12", "FOLATE", "FERRITIN", "TIBC", "IRON", "RETICCTPCT" in the last 72 hours. Sepsis Labs: No results for input(s): "PROCALCITON", "LATICACIDVEN" in the last 168 hours.  No results found for this or any previous visit (from the past 240 hour(s)).       Radiology Studies: CT ABDOMEN PELVIS W  CONTRAST  Result Date: 08/01/2023 CLINICAL DATA:  Nausea and decreased p.o. intake.  Abdominal pain. EXAM: CT ABDOMEN AND PELVIS WITH CONTRAST TECHNIQUE: Multidetector CT imaging of the abdomen and pelvis was performed using the standard protocol following bolus administration of intravenous contrast. RADIATION DOSE REDUCTION: This exam was performed according to the departmental dose-optimization program which includes automated exposure control, adjustment of the mA and/or kV according to patient size and/or use of iterative reconstruction technique. CONTRAST:  80mL OMNIPAQUE IOHEXOL 300 MG/ML  SOLN COMPARISON:  CT abdomen and pelvis 10/29/2022 and PET/CT 05/09/2023 FINDINGS: Lower chest: No acute abnormality. Hepatobiliary: Cholecystectomy. Unremarkable liver and biliary tree. Pancreas: Unremarkable. Spleen: Unremarkable. Adrenals/Urinary Tract: Stable adrenal glands and kidneys. No urinary calculi or hydronephrosis. The bladder is obscured by streak artifact. Stomach/Bowel: Stomach is within normal limits. No bowel obstruction or bowel wall thickening. Vascular/Lymphatic: Aortic atherosclerotic calcification. New enlarged conglomerate lymphadenopathy about the aorta and IVC extending along the right iliac vessels. This measures 3.1 x 8.8 cm in the axial plane on series 2/image 46.  Retrocrural node measuring 1.6 cm (series 2/image 21). 1.2 cm porta hepatis node on series 2/image 30. 1.4 cm central mesenteric node on series 2/image 54. Right inguinal lymphadenopathy measuring up to 1.5 cm (series 2/image 84). The adenopathy compresses the inferior IVC and right common iliac vein. Reproductive: Hysterectomy.  Streak artifact limits assessment. Other: Smaller free fluid in the pelvis. No free intraperitoneal air. Musculoskeletal: Bilateral THA. No acute fracture or destructive osseous lesion. IMPRESSION: 1. New enlarged conglomerate lymphadenopathy about the aorta and IVC extending along the right iliac vessels.  Additional enlarged retrocrural, porta hepatis, central mesenteric, and right inguinal lymph nodes. Findings are consistent with progression of lymphoma. Aortic Atherosclerosis (ICD10-I70.0). Electronically Signed   By: Minerva Fester M.D.   On: 08/01/2023 00:13        Scheduled Meds:  amLODipine  5 mg Oral Daily   Chlorhexidine Gluconate Cloth  6 each Topical Daily   methylPREDNISolone (SOLU-MEDROL) injection  125 mg Intravenous QID   polyethylene glycol  17 g Oral Daily   senna-docusate  1 tablet Oral BID   sodium chloride flush  10-40 mL Intracatheter Q12H   Continuous Infusions:  cefTRIAXone (ROCEPHIN)  IV 2 g (08/02/23 0949)   [START ON 08/03/2023] lactated ringers       LOS: 1 day    Time spent: 35 minutes.     Alba Cory, MD [facility]   If 7PM-7AM, please contact night-coverage [email or web address]  08/02/2023, 4:26 PM

## 2023-08-02 NOTE — Progress Notes (Signed)
DISCONTINUE ON PATHWAY REGIMEN - Lymphoma and CLL     A cycle is every 21 days:     Belinostat   **Always confirm dose/schedule in your pharmacy ordering system**  REASON: Disease Progression PRIOR TREATMENT: [id number]: Belinostat 1,000 mg/m2 D1-5 q21 Days Until Progression or Unacceptable Toxicity TREATMENT RESPONSE: Partial Response (PR)  START OFF PATHWAY REGIMEN - Lymphoma and CLL   [id number]:Bendamustine 120 mg/m2 IV D1,2 + G-CSF q28 Days x 6 Cycles:   A cycle is every 28 days:     Bendamustine      Pegfilgrastim-[id number]   **Always confirm dose/schedule in your pharmacy ordering system**  Patient Characteristics: T-Cell Lymphoma (Systemic), Third Line and Beyond, Treating as Specialist or Consulting with Specialist Disease Type: Not Applicable Disease Type: T-Cell Lymphoma (Systemic) Disease Type: Not Applicable Line of Therapy: Third Line and Beyond Please indicate whether you are: A specialist Intent of Therapy: Non-Curative / Palliative Intent, Discussed with Patient

## 2023-08-02 NOTE — Progress Notes (Signed)
Pt c/o skin discomfort on her neck. Pt states she uses a cream for radiation burns called Sonafine Wound Dressing. Pt would like oncology or attending to order for inpatient use. Pt provided with moisturizer at this time.

## 2023-08-02 NOTE — Plan of Care (Signed)

## 2023-08-03 DIAGNOSIS — K831 Obstruction of bile duct: Secondary | ICD-10-CM | POA: Diagnosis not present

## 2023-08-03 LAB — CBC
HCT: 31 % — ABNORMAL LOW (ref 36.0–46.0)
Hemoglobin: 10.4 g/dL — ABNORMAL LOW (ref 12.0–15.0)
MCH: 27.9 pg (ref 26.0–34.0)
MCHC: 33.5 g/dL (ref 30.0–36.0)
MCV: 83.1 fL (ref 80.0–100.0)
Platelets: 178 10*3/uL (ref 150–400)
RBC: 3.73 MIL/uL — ABNORMAL LOW (ref 3.87–5.11)
RDW: 21.6 % — ABNORMAL HIGH (ref 11.5–15.5)
WBC: 7.5 10*3/uL (ref 4.0–10.5)
nRBC: 0 % (ref 0.0–0.2)

## 2023-08-03 LAB — COMPREHENSIVE METABOLIC PANEL
ALT: 84 U/L — ABNORMAL HIGH (ref 0–44)
AST: 151 U/L — ABNORMAL HIGH (ref 15–41)
Albumin: 3 g/dL — ABNORMAL LOW (ref 3.5–5.0)
Alkaline Phosphatase: 274 U/L — ABNORMAL HIGH (ref 38–126)
Anion gap: 11 (ref 5–15)
BUN: 28 mg/dL — ABNORMAL HIGH (ref 8–23)
CO2: 21 mmol/L — ABNORMAL LOW (ref 22–32)
Calcium: 8.8 mg/dL — ABNORMAL LOW (ref 8.9–10.3)
Chloride: 104 mmol/L (ref 98–111)
Creatinine, Ser: 1.22 mg/dL — ABNORMAL HIGH (ref 0.44–1.00)
GFR, Estimated: 47 mL/min — ABNORMAL LOW (ref >60)
Glucose, Bld: 139 mg/dL — ABNORMAL HIGH (ref 70–99)
Potassium: 3.2 mmol/L — ABNORMAL LOW (ref 3.5–5.1)
Sodium: 136 mmol/L (ref 135–145)
Total Bilirubin: 1.1 mg/dL (ref <1.2)
Total Protein: 5.8 g/dL — ABNORMAL LOW (ref 6.5–8.1)

## 2023-08-03 LAB — MAGNESIUM: Magnesium: 1.8 mg/dL (ref 1.7–2.4)

## 2023-08-03 MED ORDER — POLYETHYLENE GLYCOL 3350 17 G PO PACK: 17.0000 g | PACK | Freq: Every day | ORAL | 0 refills | Status: AC

## 2023-08-03 MED ORDER — MAGNESIUM 200 MG PO TABS: 400.0000 mg | ORAL_TABLET | Freq: Two times a day (BID) | ORAL | 0 refills | Status: AC

## 2023-08-03 MED ORDER — MAGNESIUM SULFATE 2 GM/50ML IV SOLN
2.0000 g | Freq: Once | INTRAVENOUS | Status: AC
  Administered 2023-08-03: 2 g via INTRAVENOUS
  Filled 2023-08-03: qty 50

## 2023-08-03 MED ORDER — AMLODIPINE BESYLATE 5 MG PO TABS: 5.0000 mg | ORAL_TABLET | Freq: Every day | ORAL | 0 refills | Status: AC

## 2023-08-03 MED ORDER — CEPHALEXIN 500 MG PO CAPS: 500.0000 mg | ORAL_CAPSULE | Freq: Three times a day (TID) | ORAL | 0 refills | Status: AC

## 2023-08-03 MED ORDER — POTASSIUM CHLORIDE CRYS ER 20 MEQ PO TBCR
40.0000 meq | EXTENDED_RELEASE_TABLET | ORAL | Status: AC
  Administered 2023-08-03 (×2): 40 meq via ORAL
  Filled 2023-08-03 (×2): qty 2

## 2023-08-03 MED ORDER — POTASSIUM CHLORIDE CRYS ER 20 MEQ PO TBCR: 40.0000 meq | EXTENDED_RELEASE_TABLET | Freq: Every day | ORAL | 0 refills | Status: DC

## 2023-08-03 MED ORDER — HEPARIN SOD (PORK) LOCK FLUSH 100 UNIT/ML IV SOLN
500.0000 [IU] | INTRAVENOUS | Status: AC | PRN
  Administered 2023-08-03: 500 [IU]

## 2023-08-03 MED ORDER — SENNOSIDES-DOCUSATE SODIUM 8.6-50 MG PO TABS: 1.0000 | ORAL_TABLET | Freq: Two times a day (BID) | ORAL | 0 refills | Status: AC

## 2023-08-03 MED ORDER — PANTOPRAZOLE SODIUM 40 MG PO TBEC: 40.0000 mg | DELAYED_RELEASE_TABLET | Freq: Two times a day (BID) | ORAL | 0 refills | Status: AC

## 2023-08-03 NOTE — Plan of Care (Signed)

## 2023-08-03 NOTE — Discharge Summary (Signed)
Physician Discharge Summary   [name] MRN: [id number] DOB: June 25, 1951  Admit date:     07/31/2023  Discharge date: 08/03/23  Discharge Physician: Alba Cory   PCP: [name], MD   Recommendations at discharge:    Needs follow up with Dr Rutherford Nail forest for further Tx options. and Dr Candise Che for further care lymphoma.  Needs Cmet to follow LFT and electrolytes. Need Mg level.   Discharge Diagnoses: Principal Problem:   Biliary obstruction Active Problems:   Hypokalemia   Essential hypertension   T-cell lymphoma (HCC)   Transaminitis   Generalized abdominal pain   AKI (acute kidney injury) (HCC)   Depression   GAD (generalized anxiety disorder)  Resolved Problems:   * No resolved hospital problems. [facility] Course: 72 year old with past medical history significant for non-Hodgkin lymphoma, essential hypertension, cholecystectomy admitted to [facility] on 11/14 with suspected extrinsic biliary obstruction. Patient presents with abdominal pain, generalized, sharp. Associated with nausea decreased oral intake. She presented with AKI with a creatinine of 1.7 compared to 1.6 06/2023, transaminases alkaline phosphatase 307, total bilirubin 2.0, AST 298, ALT 135, lipase 24. White blood cell count 4.8. CT abdomen and pelvis shows new enlarge conglomerate lymphadenopathy about the aorta, IVC, right iliac vessels, porta hepatis, consistent with progression of lymphoma, status postcholecystectomy, unremarkable appearing liver, biliary tree and pancreas. No evidence of bowel obstruction perforation or abscess.    Assessment and Plan: 1-Transaminases CT new enlarge conglomerate lymphadenopathy about the aorta, IVC, right iliac vessels, porta hepatis, consistent with progression of lymphoma, status postcholecystectomy, unremarkable appearing liver, biliary tree and pancreas.  Evaluated by GI, Dr Doretha Imus who doesn't think patient has significant biliary dilation  from porta hepatis lymph nodes. No need for ERCP at this time. LFT also trending down.  Think transaminases related to worsening lymphoma.  If worsening LFT could consider MRCP.  LFT trending down.  Ok to discharge if symptoms controlled and LFT improving per Oncologist   Non-Hodgkin lymphoma: Worsening.  -LDH 5933 -Dr Candise Che Consulted.  -Patient started on IV solumedrol high dose.  -Needs to follow up with Dr Rutherford Nail forest for further Tx options.   Generalized abdominal pain: Related to lymphoma and constipation. Improved after smog enema Had BM.   Continue with Miralax.    AKI -Presents with a creatinine of 1.7, prior creatinine level 1.1. Suspect related to hypovolemia in the setting of poor oral intake. -Treated  with IV fluids.   Cr down to 1.2 Advised to keep herself hydrated.   Hypokalemia: -Replete orally prior to discharge, and discharge on oral supplement.    Hypomagnesemia: Replete IV. Discharge on oral supplement.    Thrombocytopenia; monitor.    Essential hypertension: Hold Cozaar in setting of AKI Start Norvasc.    Depression/GAD: Not taking  Cymbalta            Consultants: Dr Candise Che.  Procedures performed: None Disposition: Home Diet recommendation:  Discharge Diet Orders (From admission, onward)     Start     Ordered   08/03/23 0000  Diet - low sodium heart healthy        08/03/23 0903           Cardiac diet DISCHARGE MEDICATION: Allergies as of 08/03/2023       Reactions   Tramadol Itching, Rash   Belinostat Nausea And Vomiting   Headache and  nausea during infusion. See progress note from 02/24/23   Codeine Itching, Rash   Vicodin [hydrocodone-acetaminophen]  Itching, Rash        Medication List     STOP taking these medications    aspirin-acetaminophen-caffeine 250-250-65 MG tablet Commonly known as: EXCEDRIN MIGRAINE   DULoxetine 60 MG capsule Commonly known as: Cymbalta   fluconazole 200 MG tablet Commonly known  as: DIFLUCAN   Gerhardt's butt cream Crea   losartan 100 MG tablet Commonly known as: COZAAR   nystatin powder Commonly known as: MYCOSTATIN/NYSTOP       TAKE these medications    acyclovir 400 MG tablet Commonly known as: ZOVIRAX Take 1 tablet (400 mg total) by mouth daily.   amLODipine 5 MG tablet Commonly known as: NORVASC Take 1 tablet (5 mg total) by mouth daily.   cephALEXin 500 MG capsule Commonly known as: KEFLEX Take 1 capsule (500 mg total) by mouth 3 (three) times daily for 3 days.   dexamethasone 4 MG tablet Commonly known as: DECADRON Take 2 tablets (8 mg total) by mouth daily. Start the day after bendamustine chemotherapy for 2 days. Take with food.   lidocaine-prilocaine cream Commonly known as: EMLA Apply to affected area once   magic mouthwash (multi-ingredient) oral suspension Take 5 mLs by mouth 3 (three) times daily as needed.   magic mouthwash w/lidocaine Soln Take 5 mLs by mouth 3 (three) times daily as needed for mouth pain. Suspension contains total volume 80 ml of distilled water 80ml of maalox 80ml of 2% viscous lidocaine 80ml of nystatin at 500000 units per 5 ml 80ml of diphenhydramine at 12.5mg /63ml   Magnesium 200 MG Tabs Take 2 tablets (400 mg total) by mouth 2 (two) times daily.   methocarbamol 750 MG tablet Commonly known as: ROBAXIN Take 1 tablet (750 mg total) by mouth every 8 (eight) hours as needed for muscle spasms.   naloxone 4 MG/0.1ML Liqd nasal spray kit Commonly known as: NARCAN 1 squirt in the naris for unresponsiveness   ondansetron 8 MG tablet Commonly known as: Zofran Take 1 tablet (8 mg total) by mouth every 8 (eight) hours as needed for nausea or vomiting. Start on the third day after chemotherapy. What changed: additional instructions   pantoprazole 40 MG tablet Commonly known as: PROTONIX Take 1 tablet (40 mg total) by mouth 2 (two) times daily. What changed: when to take this   polyethylene  glycol 17 g packet Commonly known as: MIRALAX / GLYCOLAX Take 17 g by mouth daily.   potassium chloride SA 20 MEQ tablet Commonly known as: KLOR-CON M Take 2 tablets (40 mEq total) by mouth daily for 10 days. What changed:  how much to take when to take this   prochlorperazine 10 MG tablet Commonly known as: COMPAZINE Take 1 tablet (10 mg total) by mouth every 6 (six) hours as needed for nausea or vomiting.   senna-docusate 8.6-50 MG tablet Commonly known as: Senokot-S Take 1 tablet by mouth 2 (two) times daily.   sucralfate 1 g tablet Commonly known as: CARAFATE Dissolve 1 tablet in 5-10ml of water twice a day and then swallow slurry.   triamcinolone ointment 0.5 % Commonly known as: KENALOG Apply 1 Application topically 2 (two) times daily. To rash under both armpits What changed:  when to take this reasons to take this        Follow-up Information     [name], MD Follow up in 1 week(s).   Specialty: Family Medicine Contact information: [address] [place] [address] [phone]  [name], MD Follow up in 1 week(s).   Specialties: Hematology, Oncology Contact information: [address] [place] [address] [phone]                Discharge Exam: Ceasar Mons Weights   07/31/23 1916 08/02/23 0514 08/03/23 0500  Weight: 84.8 kg 93.4 kg 93.3 kg   General; NAD  Condition at discharge: stable  The results of significant diagnostics from this hospitalization (including imaging, microbiology, ancillary and laboratory) are listed below for reference.   Imaging Studies: CT ABDOMEN PELVIS W CONTRAST  Result Date: 08/01/2023 CLINICAL DATA:  Nausea and decreased p.o. intake.  Abdominal pain. EXAM: CT ABDOMEN AND PELVIS WITH CONTRAST TECHNIQUE: Multidetector CT imaging of the abdomen and pelvis was performed using the standard protocol following bolus administration of intravenous contrast. RADIATION DOSE  REDUCTION: This exam was performed according to the departmental dose-optimization program which includes automated exposure control, adjustment of the mA and/or kV according to patient size and/or use of iterative reconstruction technique. CONTRAST:  80mL OMNIPAQUE IOHEXOL 300 MG/ML  SOLN COMPARISON:  CT abdomen and pelvis 10/29/2022 and PET/CT 05/09/2023 FINDINGS: Lower chest: No acute abnormality. Hepatobiliary: Cholecystectomy. Unremarkable liver and biliary tree. Pancreas: Unremarkable. Spleen: Unremarkable. Adrenals/Urinary Tract: Stable adrenal glands and kidneys. No urinary calculi or hydronephrosis. The bladder is obscured by streak artifact. Stomach/Bowel: Stomach is within normal limits. No bowel obstruction or bowel wall thickening. Vascular/Lymphatic: Aortic atherosclerotic calcification. New enlarged conglomerate lymphadenopathy about the aorta and IVC extending along the right iliac vessels. This measures 3.1 x 8.8 cm in the axial plane on series 2/image 46. Retrocrural node measuring 1.6 cm (series 2/image 21). 1.2 cm porta hepatis node on series 2/image 30. 1.4 cm central mesenteric node on series 2/image 54. Right inguinal lymphadenopathy measuring up to 1.5 cm (series 2/image 84). The adenopathy compresses the inferior IVC and right common iliac vein. Reproductive: Hysterectomy.  Streak artifact limits assessment. Other: Smaller free fluid in the pelvis. No free intraperitoneal air. Musculoskeletal: Bilateral THA. No acute fracture or destructive osseous lesion. IMPRESSION: 1. New enlarged conglomerate lymphadenopathy about the aorta and IVC extending along the right iliac vessels. Additional enlarged retrocrural, porta hepatis, central mesenteric, and right inguinal lymph nodes. Findings are consistent with progression of lymphoma. Aortic Atherosclerosis (ICD10-I70.0). Electronically Signed   By: Minerva Fester M.D.   On: 08/01/2023 00:13    Microbiology: Results for orders placed or  performed in visit on 03/25/23  Urine Culture     Status: Abnormal   Collection Time: 03/25/23  5:19 PM   Specimen: Urine, Clean Catch  Result Value Ref Range Status   Specimen Description   Final    URINE, CLEAN CATCH Performed at [facility], [address], [place], [place] [address]    Special Requests   Final    NONE Performed at [facility], [address], [place], [place] [address]    Culture >=100,000 COLONIES/mL KLEBSIELLA PNEUMONIAE (A)  Final   Report Status 03/27/2023 FINAL  Final   Organism ID, Bacteria KLEBSIELLA PNEUMONIAE (A)  Final      Susceptibility   Klebsiella pneumoniae - MIC*    AMPICILLIN >=32 RESISTANT Resistant     CEFAZOLIN <=4 SENSITIVE Sensitive     CEFEPIME <=0.12 SENSITIVE Sensitive     CEFTRIAXONE <=0.25 SENSITIVE Sensitive     CIPROFLOXACIN <=0.25 SENSITIVE Sensitive     GENTAMICIN <=1 SENSITIVE Sensitive     IMIPENEM <=0.25 SENSITIVE Sensitive  NITROFURANTOIN 128 RESISTANT Resistant     TRIMETH/SULFA <=20 SENSITIVE Sensitive     AMPICILLIN/SULBACTAM 4 SENSITIVE Sensitive     PIP/TAZO <=4 SENSITIVE Sensitive     * >=100,000 COLONIES/mL KLEBSIELLA PNEUMONIAE    Labs: CBC: Recent Labs  Lab 07/31/23 1917 08/01/23 0400 08/02/23 0218 08/03/23 0520  WBC 4.8 5.4 3.9* 7.5  NEUTROABS  --  3.6  --   --   HGB 10.8* 10.3* 10.4* 10.4*  HCT 33.3* 32.8* 33.3* 31.0*  MCV 85.8 88.6 87.6 83.1  PLT 131* 137* 152 178   Basic Metabolic Panel: Recent Labs  Lab 07/31/23 1917 08/01/23 0400 08/02/23 0218 08/02/23 1550 08/03/23 0520  NA 140 140 132*  --  136  K 3.1* 3.1* 3.5  --  3.2*  CL 109 110 104  --  104  CO2 19* 19* 19*  --  21*  GLUCOSE 88 81 210*  --  139*  BUN 22 21 21   --  28*  CREATININE 1.71* 1.86* 1.55*  --  1.22*  CALCIUM 9.6 9.1 8.7*  --  8.8*  MG 1.4* 1.5*  --  1.5*  --    Liver Function Tests: Recent Labs  Lab 07/31/23 1917 08/01/23 0400 08/02/23 0218 08/03/23 0520  AST  298* 284* 246* 151*  ALT 135* 124* 109* 84*  ALKPHOS 307* 289* 337* 274*  BILITOT 2.0* 2.0* 1.9* 1.1  PROT 5.8* 5.3* 5.3* 5.8*  ALBUMIN 3.2* 2.9* 2.9* 3.0*   CBG: No results for input(s): "GLUCAP" in the last 168 hours.  Discharge time spent: greater than 30 minutes.  Signed: Alba Cory, MD [facility] 08/03/2023

## 2023-08-04 LAB — CULTURE, OB URINE: Culture: 10000 — AB

## 2023-08-05 ENCOUNTER — Encounter: Payer: Self-pay | Admitting: Hematology

## 2023-08-05 ENCOUNTER — Telehealth: Payer: Self-pay | Admitting: Hematology

## 2023-08-05 ENCOUNTER — Encounter: Payer: PPO | Admitting: Physical Therapy

## 2023-08-05 NOTE — Telephone Encounter (Signed)
Left patient a message and also patient's daughter a message in regards to scheduled appointment; left callback number if needed for rescheduling

## 2023-08-06 ENCOUNTER — Other Ambulatory Visit: Payer: Self-pay

## 2023-08-06 ENCOUNTER — Encounter: Payer: PPO | Admitting: Physical Therapy

## 2023-08-06 DIAGNOSIS — C8590 Non-Hodgkin lymphoma, unspecified, unspecified site: Secondary | ICD-10-CM | POA: Diagnosis not present

## 2023-08-06 DIAGNOSIS — C8448 Peripheral T-cell lymphoma, not classified, lymph nodes of multiple sites: Secondary | ICD-10-CM | POA: Diagnosis not present

## 2023-08-06 NOTE — Progress Notes (Signed)
Pharmacist Chemotherapy Monitoring - Initial Assessment    Anticipated start date: 08/11/23   The following has been reviewed per standard work regarding the patient's treatment regimen: The patient's diagnosis, treatment plan and drug doses, and organ/hematologic function Lab orders and baseline tests specific to treatment regimen  The treatment plan start date, drug sequencing, and pre-medications Prior authorization status  Patient's documented medication list, including drug-drug interaction screen and prescriptions for anti-emetics and supportive care specific to the treatment regimen The drug concentrations, fluid compatibility, administration routes, and timing of the medications to be used The patient's access for treatment and lifetime cumulative dose history, if applicable  The patient's medication allergies and previous infusion related reactions, if applicable   Changes made to treatment plan:  N/A  Follow up needed:  N/A   Ebony Hail, Pharm.D., CPP 08/06/2023@3 :41 PM

## 2023-08-07 ENCOUNTER — Encounter: Payer: Self-pay | Admitting: Hematology

## 2023-08-07 ENCOUNTER — Encounter (HOSPITAL_COMMUNITY)
Admission: RE | Admit: 2023-08-07 | Discharge: 2023-08-07 | Disposition: A | Discharge status: Home / Self Care | Payer: PPO | Source: Ambulatory Visit | Attending: Hematology | Admitting: Hematology

## 2023-08-07 DIAGNOSIS — C8448 Peripheral T-cell lymphoma, not classified, lymph nodes of multiple sites: Secondary | ICD-10-CM | POA: Insufficient documentation

## 2023-08-07 DIAGNOSIS — R918 Other nonspecific abnormal finding of lung field: Secondary | ICD-10-CM | POA: Diagnosis not present

## 2023-08-07 LAB — GLUCOSE, CAPILLARY: Glucose-Capillary: 78 mg/dL (ref 70–99)

## 2023-08-07 MED ORDER — FLUDEOXYGLUCOSE F - 18 (FDG) INJECTION
10.2200 | Freq: Once | INTRAVENOUS | Status: AC | PRN
  Administered 2023-08-07: 10.22 via INTRAVENOUS

## 2023-08-11 ENCOUNTER — Other Ambulatory Visit: Payer: Self-pay

## 2023-08-11 ENCOUNTER — Inpatient Hospital Stay: Payer: PPO

## 2023-08-11 ENCOUNTER — Inpatient Hospital Stay: Payer: PPO | Attending: Hematology

## 2023-08-11 ENCOUNTER — Encounter: Payer: Self-pay | Admitting: Physical Therapy

## 2023-08-11 ENCOUNTER — Ambulatory Visit: Payer: PPO | Admitting: Physical Therapy

## 2023-08-11 ENCOUNTER — Inpatient Hospital Stay (HOSPITAL_BASED_OUTPATIENT_CLINIC_OR_DEPARTMENT_OTHER): Payer: PPO | Admitting: Hematology

## 2023-08-11 VITALS — BP 121/77 | HR 92 | Temp 98.3°F | Resp 16 | Ht 65.0 in | Wt 180.3 lb

## 2023-08-11 DIAGNOSIS — R262 Difficulty in walking, not elsewhere classified: Secondary | ICD-10-CM | POA: Diagnosis not present

## 2023-08-11 DIAGNOSIS — Z5111 Encounter for antineoplastic chemotherapy: Secondary | ICD-10-CM

## 2023-08-11 DIAGNOSIS — Z95828 Presence of other vascular implants and grafts: Secondary | ICD-10-CM

## 2023-08-11 DIAGNOSIS — C8590 Non-Hodgkin lymphoma, unspecified, unspecified site: Secondary | ICD-10-CM

## 2023-08-11 DIAGNOSIS — Z79899 Other long term (current) drug therapy: Secondary | ICD-10-CM | POA: Diagnosis not present

## 2023-08-11 DIAGNOSIS — I890 Lymphedema, not elsewhere classified: Secondary | ICD-10-CM | POA: Diagnosis not present

## 2023-08-11 DIAGNOSIS — R7989 Other specified abnormal findings of blood chemistry: Secondary | ICD-10-CM | POA: Diagnosis not present

## 2023-08-11 DIAGNOSIS — L599 Disorder of the skin and subcutaneous tissue related to radiation, unspecified: Secondary | ICD-10-CM

## 2023-08-11 DIAGNOSIS — C50411 Malignant neoplasm of upper-outer quadrant of right female breast: Secondary | ICD-10-CM

## 2023-08-11 DIAGNOSIS — M6281 Muscle weakness (generalized): Secondary | ICD-10-CM | POA: Diagnosis not present

## 2023-08-11 DIAGNOSIS — C8448 Peripheral T-cell lymphoma, not classified, lymph nodes of multiple sites: Secondary | ICD-10-CM

## 2023-08-11 DIAGNOSIS — Z170 Estrogen receptor positive status [ER+]: Secondary | ICD-10-CM

## 2023-08-11 LAB — CMP (CANCER CENTER ONLY)
ALT: 145 U/L — ABNORMAL HIGH (ref 0–44)
AST: 229 U/L (ref 15–41)
Albumin: 3.3 g/dL — ABNORMAL LOW (ref 3.5–5.0)
Alkaline Phosphatase: 430 U/L — ABNORMAL HIGH (ref 38–126)
Anion gap: <0 — ABNORMAL LOW (ref 5–15)
BUN: 15 mg/dL (ref 8–23)
CO2: 36 mmol/L — ABNORMAL HIGH (ref 22–32)
Calcium: 10.4 mg/dL — ABNORMAL HIGH (ref 8.9–10.3)
Chloride: 102 mmol/L (ref 98–111)
Creatinine: 1.27 mg/dL — ABNORMAL HIGH (ref 0.44–1.00)
GFR, Estimated: 45 mL/min — ABNORMAL LOW (ref >60)
Glucose, Bld: 126 mg/dL — ABNORMAL HIGH (ref 70–99)
Potassium: 3.8 mmol/L (ref 3.5–5.1)
Sodium: 137 mmol/L (ref 135–145)
Total Bilirubin: 3.4 mg/dL — ABNORMAL HIGH (ref <1.2)
Total Protein: 5.9 g/dL — ABNORMAL LOW (ref 6.5–8.1)

## 2023-08-11 LAB — CBC WITH DIFFERENTIAL (CANCER CENTER ONLY)
Abs Immature Granulocytes: 0.05 10*3/uL (ref 0.00–0.07)
Basophils Absolute: 0 10*3/uL (ref 0.0–0.1)
Basophils Relative: 0 %
Eosinophils Absolute: 0.1 10*3/uL (ref 0.0–0.5)
Eosinophils Relative: 1 %
HCT: 34.7 % — ABNORMAL LOW (ref 36.0–46.0)
Hemoglobin: 11.4 g/dL — ABNORMAL LOW (ref 12.0–15.0)
Immature Granulocytes: 1 %
Lymphocytes Relative: 12 %
Lymphs Abs: 0.6 10*3/uL — ABNORMAL LOW (ref 0.7–4.0)
MCH: 28.2 pg (ref 26.0–34.0)
MCHC: 32.9 g/dL (ref 30.0–36.0)
MCV: 85.9 fL (ref 80.0–100.0)
Monocytes Absolute: 0.3 10*3/uL (ref 0.1–1.0)
Monocytes Relative: 6 %
Neutro Abs: 3.6 10*3/uL (ref 1.7–7.7)
Neutrophils Relative %: 80 %
Platelet Count: 143 10*3/uL — ABNORMAL LOW (ref 150–400)
RBC: 4.04 MIL/uL (ref 3.87–5.11)
RDW: 21.9 % — ABNORMAL HIGH (ref 11.5–15.5)
Smear Review: NORMAL
WBC Count: 4.6 10*3/uL (ref 4.0–10.5)
nRBC: 0 % (ref 0.0–0.2)

## 2023-08-11 MED ORDER — SODIUM CHLORIDE 0.9 % IV SOLN
50.0000 mg/m2 | Freq: Once | INTRAVENOUS | Status: AC
  Administered 2023-08-11: 100 mg via INTRAVENOUS
  Filled 2023-08-11: qty 4

## 2023-08-11 MED ORDER — SODIUM CHLORIDE 0.9 % IV SOLN: INTRAVENOUS | Status: DC

## 2023-08-11 MED ORDER — PALONOSETRON HCL INJECTION 0.25 MG/5ML
0.2500 mg | Freq: Once | INTRAVENOUS | Status: AC
  Administered 2023-08-11: 0.25 mg via INTRAVENOUS
  Filled 2023-08-11: qty 5

## 2023-08-11 MED ORDER — DEXAMETHASONE SODIUM PHOSPHATE 10 MG/ML IJ SOLN
10.0000 mg | Freq: Once | INTRAMUSCULAR | Status: AC
  Administered 2023-08-11: 10 mg via INTRAVENOUS
  Filled 2023-08-11: qty 1

## 2023-08-11 MED ORDER — HEPARIN SOD (PORK) LOCK FLUSH 100 UNIT/ML IV SOLN
500.0000 [IU] | Freq: Once | INTRAVENOUS | Status: AC | PRN
Start: 2023-08-11 — End: 2023-08-11
  Administered 2023-08-11: 500 [IU]

## 2023-08-11 MED ORDER — SODIUM CHLORIDE 0.9% FLUSH
10.0000 mL | INTRAVENOUS | Status: DC | PRN
  Administered 2023-08-11: 10 mL

## 2023-08-11 MED ORDER — SODIUM CHLORIDE 0.9% FLUSH
10.0000 mL | Freq: Once | INTRAVENOUS | Status: AC
  Administered 2023-08-11: 10 mL

## 2023-08-11 NOTE — Progress Notes (Signed)
Patient seen by Dr. Addison Naegeli are within treatment parameters.  Labs reviewed: and are not all within treatment parameters.    Dr Candise Che aware of all CMP values, he will adjust tx for today  Per physician team, patient is ready for treatment. Please note that modifications are being made to the treatment plan including    reduce Bendamustine down to 50mg /m2

## 2023-08-11 NOTE — Progress Notes (Signed)
[facility]   [phone] [phone]    HEMATOLOGY ONCOLOGY CLINIC VISIT   Patient Care Team: [name], MD as PCP - General (Family Medicine) [name], MD as PCP - Hematology/Oncology (Hematology) [name], MD as Consulting Physician (Gastroenterology) [name], MD as Consulting Physician (Hematology) [name], NP as Nurse Practitioner (Nurse Practitioner) [name], MD as Consulting Physician (Radiation Oncology) [name], MD as Consulting Physician (General Surgery) [name], MD as Attending Physician (Otolaryngology)  Date of Service: 08/11/23    CHIEF COMPLAINT: f/u for evaluation and management of T cell lymphoma.  SUMMARY OF ONCOLOGIC HISTORY: Oncology History Overview Note  Cancer Staging Anal cancer (HCC) Staging form: Anus, AJCC 8th Edition - Clinical stage from 06/28/2019: Stage IIA (cT2, cN0, cM0) - Signed by [name], NP on 06/28/2019  Breast cancer of upper-outer quadrant of left female breast [facility]) Staging form: Breast, AJCC 7th Edition - Clinical stage from 09/07/2013: Stage IIA (T2, N0, cM0) - Signed by [name], MD on 09/07/2013 - Pathologic: Stage IIA (T2, N0, cM0) - Signed by [name], MD on 09/07/2013  Breast cancer of upper-outer quadrant of right female breast [facility]) Staging form: Breast, AJCC 7th Edition - Clinical stage from 09/07/2013: Stage IA (T1a, N0, cM0) - Signed by [name], MD on 09/07/2013 - Pathologic: Stage IA (T1a, N0, cM0) - Signed by [name], MD on 09/07/2013    Breast cancer of upper-outer quadrant of left female breast (HCC)  07/10/2007 Procedure   US biopsy showed invasive ductal carcinoma 0.8cm, Nottingham grade 3   07/30/2007 Surgery   She had left breast lumpectomy and LN biopsy which showed high grade invasive ductal cancer Nottingham grade 3, 2.7 cm, ER/PR/Her2 neu negative   11/24/2007 Surgery   Patient elected for bilateral  mastectomy   09/07/2008 - 03/08/2009 Chemotherapy   dates are approximate: she received adriamycin and cytoxan followed by Taxol   03/08/2009 - 05/08/2009 Radiation Therapy   dates are approximate, she received XRT    07/09/2021 Imaging   CT CAP  IMPRESSION: 1. No evidence to suggest locally recurrent ianorectal neoplasm or metastatic disease in the chest, abdomen or pelvis. 2. Status post bilateral modified radical mastectomy and bilateral axillary lymph node dissection. 3. Three small supraumbilical ventral hernias containing only omental fat. No associated bowel incarceration or obstruction at this time. 4. Aortic atherosclerosis. 5. Additional incidental findings, as above.   Breast cancer of upper-outer quadrant of right female breast (HCC)  07/10/2006 Procedure   stereotactic biopsy showed atypical hyperplasia   10/23/2006 Surgery   right lumpectomy showed invasive ductal ca (Nottingham grade 1)  2mm and DCIS, ER/PR positive Her 2 negative   10/09/2007 - 10/08/2012 Chemotherapy   Patient was placed on Tamoxifen   03/24/2017 Imaging   No acute findings. No evidence of recurrent carcinoma or metastatic disease   07/09/2021 Imaging   CT CAP  IMPRESSION: 1. No evidence to suggest locally recurrent ianorectal neoplasm or metastatic disease in the chest, abdomen or pelvis. 2. Status post bilateral modified radical mastectomy and bilateral axillary lymph node dissection. 3. Three small supraumbilical ventral hernias containing only omental fat. No associated bowel incarceration or obstruction at this time. 4. Aortic atherosclerosis. 5. Additional incidental findings, as above.   Anal cancer (HCC)  06/04/2019 Procedure   Colonoscopy per Dr. [name]: Findings-the digital rectal exam revealed a 3 cm diameter firm rectal mass.  The mass was noncircumferential and located predominantly at  the right bowel wall at the anorectal junction. A nonobstructing mass was found at the  anus and in the rectum    06/04/2019 Initial Biopsy   Follow pathology: Large intestine, rectum biopsy: Invasive well to moderately differentiated squamous cell carcinoma.  No rectal mucosa present.  There is strong diffuse expression of P 16 immunostain.  CDX 2, p63 and mCEA immunostains are also used in the diagnostic work-up of the case.   06/04/2019 Initial Diagnosis   Anal cancer (HCC)   06/21/2019 PET scan   IMPRESSION: 1. Anorectal primary. No hypermetabolic metastatic disease within the chest, abdomen, or pelvis. Perirectal nodes, at least 1 of which is new since 02/26/2018 CT, suspicious based on size and interval development. 2. Mild limitations secondary to beam hardening artifact from bilateral hip arthroplasty. 3.  Aortic Atherosclerosis (ICD10-I70.0).   06/28/2019 Cancer Staging   Staging form: Anus, AJCC 8th Edition - Clinical stage from 06/28/2019: Stage IIA (cT2, cN0, cM0) - Signed by [name], NP on 06/28/2019   06/28/2019 - 07/26/2019 Chemotherapy   concurrent chemoRT with Mitomycin and 5FU on week 1 and week 5 on starting 06/28/19. Last dose on 07/26/19   06/28/2019 - 08/09/2019 Radiation Therapy   concurrent chemoRT with Dr Mitzi Hansen 06/28/19-08/09/19   11/01/2019 PET scan   IMPRESSION: 1. Marked interval decrease in hypermetabolism noted at the level of the anal rectal primary. No evidence for hypermetabolic metastatic disease in the chest, abdomen, or pelvis. The perirectal lymph nodes identified previously have resolved in the interval. 2.  Aortic Atherosclerois (ICD10-170.0)   08/07/2020 Imaging   CT CAP  IMPRESSION: 1. No findings for residual/recurrent anal tumor, regional lymphadenopathy or metastatic disease. 2. Status post cholecystectomy. No biliary dilatation. 3. Small anterior abdominal wall hernia containing fat. 4. Aortic atherosclerosis.   Aortic Atherosclerosis (ICD10-I70.0).     07/09/2021 Imaging   CT CAP  IMPRESSION: 1. No  evidence to suggest locally recurrent ianorectal neoplasm or metastatic disease in the chest, abdomen or pelvis. 2. Status post bilateral modified radical mastectomy and bilateral axillary lymph node dissection. 3. Three small supraumbilical ventral hernias containing only omental fat. No associated bowel incarceration or obstruction at this time. 4. Aortic atherosclerosis. 5. Additional incidental findings, as above.   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)   07/08/2022 - 10/24/2022 Chemotherapy   Patient is on Treatment Plan : NON-HODGKINS LYMPHOMA CHOEP q21d     02/24/2023 - 04/26/2023 Chemotherapy   Patient is on Treatment Plan : NON-HODGKIN'S LYMPHOMA T-CELL Belinostat D1-5 q21d     T-cell lymphoma (HCC)  06/10/2022 Cancer Staging   Staging form: Hodgkin and Non-Hodgkin Lymphoma, AJCC 8th Edition - Clinical stage from 06/10/2022: Stage IV (Peripheral T-cell lymphoma) - Signed by [name], MD on 08/02/2023 Stage prefix: Initial diagnosis   07/05/2022 Initial Diagnosis   T-cell lymphoma (HCC)   08/04/2023 -  Chemotherapy   Patient is on Treatment Plan : LEUKEMIA CLL Bendamustine (100) D1,2 q28d        HPI  Cynthia Velasquez has been referred to [name] by Dr. Mosetta Putt for evaluation and management of newly diagnosed T-cell non-Hodgkin's lymphoma.  Patient has a history of bilateral breast cancer status post mastectomies.  -2007: s/p right lumpectomy for ADH in 08/2006 showed DCIS ER/PR+, grade 1. Completed tamoxifen 09/2007 - 09/2012 -2008: stage pT2N0 left breast invasive ductal carcinoma, triple negative,  grade 3; s/p lumpectomy in 07/2007 and bilateral mastectomy 11/2007 (path negative for residual carcinoma in both breasts); s/p adjuvant chemo AC-T and radiation  -Per pt her Genetics Testing was negative. She was adopted, no known family  history    She subsequently was diagnosed with anal squamous cell carcinoma cT2 N0 M0 diagnosed in September 2020.Workup showed a 3 cm mass at the anorectal junction, biopsy confirmed well to moderately differentiated squamous cell carcinoma. 06/21/19 PET scan showed no metastasis.  -S/p concurrent chemoRT with Mitomycin and 5FU on 08/09/19. Now on surveillance. -surveillance CT CAP 07/09/21 was NED. -surveillance colonoscopy done for rectal bleeding on 12/07/21 showed only radiation changes. Her rectal bleeding has resolved.  Recently patient presented to her primary care physician on 05/23/2022 with her new right neck lump which has been progressively enlarging.  She also subsequently developed a left neck swelling.  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  She has had a PET CT scan done on 06/19/2022 which showed hypermetabolic bilateral neck lymphadenopathy.  Also noted to have a new 1 cm focus of metabolic activity in segment 4 of the liver.  This is etiology indeterminate. Lumbar scoliosis and small supraumbilical hernia.  Patient has had Port-A-Cath placement done on 06/25/2022. Echocardiogram was done on 06/17/2022 and shows normal ejection fraction of 70 to 75% with grade 1 diastolic dysfunction.  Right ventricular systolic function within normal limits.  INTERVAL HISTORY  Cynthia Velasquez is a 72 y/o female here for evaluation and management of T-cell non-Hodgkin's lymphoma. She is here for cycle 1 day 1 of bendamustine treatment.  Patient was last seen by me as in-patient on 08/01/2023 as she was admitted with abdominal pain and abnormal LFTs. She had CT abdominal, which showed significant abdominal LNadenopathy progression. She was started on Solumedrol 139m q12h.   Patient notes she has been doing fairly well since she got discharged from the hospital. She recently had a  fall this past Friday, 08/08/2023, and she hit her chest and abdominal. During this visit, she does complain of abdominal pain due to the fall. She also complains of loose bowl movement and she has been having bowel movement 1-2 times a day.   She also endorses night sweats, but denies drenching night sweats. She denies any new infection issues, fever, chills, unexpected weight loss, back pain, chest pain, or leg swelling. During this visit, she complains of fatigue and lethargy.   She was prescribed prednisone from the ED. She has not started Dexamethasone, yet.   Patient had a visit with Dr. Alphonsa Gin and she agrees with the plan to start Bendamustine treatment.   Patient notes she has been endorsing hematochezia, bright red and more than a tablespoon, since Friday night. She has not been to her PCP. She did have similar type of bleeding in the past which was due to hemorrhoids.   Patient currently lives with his daughter and her grand-daughter. Patient notes she has not talked to her daughter about her lymphoma progression.  ROS  10 Point review of Systems was done is negative except as noted above.   MEDICAL HISTORY:  Past Medical History:  Diagnosis Date   Anal cancer (HCC) dx'd 05/2019   Anxiety    Arthritis    Bilateral breast cancer (HCC) 10/21/2013   Breast cancer (HCC)    Cancer (HCC)    Depression    Gallstones    GERD (  gastroesophageal reflux disease)    doesn't take any meds for this   Headache    h/o migraines       History of bladder infections    History of hiatal hernia    History of migraine    Hyperlipidemia    takes Simvastatin daily   Hypertension    takes Hyzaar   Joint pain    Joint swelling    Lumbar stenosis    Lymphoma (HCC)    NHL   Neuropathy 09/07/2013   Pneumonia    Pre-diabetes     SURGICAL HISTORY: Past Surgical History:  Procedure Laterality Date   ABDOMINAL HYSTERECTOMY     bone spur removed from left foot     CHOLECYSTECTOMY      COLONOSCOPY     COLONOSCOPY WITH PROPOFOL Bilateral 12/07/2021   Procedure: COLONOSCOPY WITH PROPOFOL;  Surgeon: Vida Rigger, MD;  Location: [facility];  Service: Endoscopy;  Laterality: Bilateral;   double mastectomy      ESOPHAGOGASTRODUODENOSCOPY N/A 12/07/2021   Procedure: ESOPHAGOGASTRODUODENOSCOPY (EGD);  Surgeon: Vida Rigger, MD;  Location: [facility];  Service: Endoscopy;  Laterality: N/A;   HERNIA REPAIR     umbilical   IR IMAGING GUIDED PORT INSERTION  06/25/2022   right knee arthroscopy     right shoulder arthroscopy     surgery for hiatal hernia     TONSILLECTOMY     TOTAL HIP ARTHROPLASTY Left 02/12/2013   TOTAL HIP ARTHROPLASTY Left 02/12/2013   Procedure: LEFT TOTAL HIP ARTHROPLASTY;  Surgeon: Nestor Lewandowsky, MD;  Location: [facility] OR;  Service: Orthopedics;  Laterality: Left;   TOTAL HIP ARTHROPLASTY Right 05/03/2013   Procedure: TOTAL HIP ARTHROPLASTY;  Surgeon: Nestor Lewandowsky, MD;  Location: [facility] OR;  Service: Orthopedics;  Laterality: Right;   TOTAL KNEE ARTHROPLASTY Right 02/27/2015   Procedure: TOTAL KNEE ARTHROPLASTY;  Surgeon: Gean Birchwood, MD;  Location: [facility] OR;  Service: Orthopedics;  Laterality: Right;   TOTAL SHOULDER ARTHROPLASTY Right 09/12/2016   Procedure: RIGHT TOTAL SHOULDER ARTHROPLASTY;  Surgeon: Jones Broom, MD;  Location: [facility] OR;  Service: Orthopedics;  Laterality: Right;  RIGHT TOTAL SHOULDER ARTHROPLASTY    I have reviewed the social history and family history with the patient and they are unchanged from previous note.  ALLERGIES:  is allergic to tramadol, belinostat, codeine, and vicodin [hydrocodone-acetaminophen].  MEDICATIONS:  Current Outpatient Medications  Medication Sig Dispense Refill   acyclovir (ZOVIRAX) 400 MG tablet Take 1 tablet (400 mg total) by mouth daily. 30 tablet 3   amLODipine (NORVASC) 5 MG tablet Take 1 tablet (5 mg total) by mouth daily. 30 tablet 0   dexamethasone (DECADRON) 4 MG tablet Take 2 tablets (8 mg total) by mouth daily.  Start the day after bendamustine chemotherapy for 2 days. Take with food. 30 tablet 1   lidocaine-prilocaine (EMLA) cream Apply to affected area once 30 g 3   magic mouthwash (multi-ingredient) oral suspension Take 5 mLs by mouth 3 (three) times daily as needed. 400 mL 1   magic mouthwash w/lidocaine SOLN Take 5 mLs by mouth 3 (three) times daily as needed for mouth pain. Suspension contains total volume 80 ml of distilled water 80ml of maalox 80ml of 2% viscous lidocaine 80ml of nystatin at 500000 units per 5 ml 80ml of diphenhydramine at 12.5mg /39ml 400 mL 1   Magnesium 200 MG TABS Take 2 tablets (400 mg total) by mouth 2 (two) times daily. 30 tablet 0   methocarbamol (ROBAXIN) 750 MG tablet  Take 1 tablet (750 mg total) by mouth every 8 (eight) hours as needed for muscle spasms. 20 tablet 0   naloxone (NARCAN) nasal spray 4 mg/0.1 mL 1 squirt in the naris for unresponsiveness 2 each 3   ondansetron (ZOFRAN) 8 MG tablet Take 1 tablet (8 mg total) by mouth every 8 (eight) hours as needed for nausea or vomiting. Start on the third day after chemotherapy. 30 tablet 1   pantoprazole (PROTONIX) 40 MG tablet Take 1 tablet (40 mg total) by mouth 2 (two) times daily. 60 tablet 0   polyethylene glycol (MIRALAX / GLYCOLAX) 17 g packet Take 17 g by mouth daily. 14 each 0   potassium chloride SA (KLOR-CON M) 20 MEQ tablet Take 2 tablets (40 mEq total) by mouth daily for 10 days. 20 tablet 0   prochlorperazine (COMPAZINE) 10 MG tablet Take 1 tablet (10 mg total) by mouth every 6 (six) hours as needed for nausea or vomiting. 30 tablet 1   senna-docusate (SENOKOT-S) 8.6-50 MG tablet Take 1 tablet by mouth 2 (two) times daily. 30 tablet 0   sucralfate (CARAFATE) 1 g tablet Dissolve 1 tablet in 5-10ml of water twice a day and then swallow slurry. 60 tablet 0   triamcinolone ointment (KENALOG) 0.5 % Apply 1 Application topically 2 (two) times daily. To rash under both armpits (Patient taking differently:  Apply 1 Application topically 2 (two) times daily as needed (Rash). To rash under both armpits) 30 g 0   No current facility-administered medications for this visit.    PHYSICAL EXAMINATION: .There were no vitals taken for this visit.   GENERAL:alert, in no acute distress and comfortable SKIN: no acute rashes, no significant lesions EYES: conjunctiva are pink and non-injected, sclera anicteric OROPHARYNX: MMM, no exudates, no oropharyngeal erythema or ulceration NECK: supple, no JVD LYMPH:  no palpable lymphadenopathy in the cervical, axillary or inguinal regions LUNGS: clear to auscultation b/l with normal respiratory effort HEART: regular rate & rhythm ABDOMEN:  normoactive bowel sounds , non tender, not distended. Extremity: no pedal edema PSYCH: alert & oriented x 3 with fluent speech NEURO: no focal motor/sensory deficits   LABORATORY DATA:  I have reviewed the data as listed.    Latest Ref Rng & Units 08/03/2023    5:20 AM 08/02/2023    2:18 AM 08/01/2023    4:00 AM  CBC  WBC 4.0 - 10.5 K/uL 7.5  3.9  5.4   Hemoglobin 12.0 - 15.0 g/dL 96.0  45.4  09.8   Hematocrit 36.0 - 46.0 % 31.0  33.3  32.8   Platelets 150 - 400 K/uL 178  152  137    .    Latest Ref Rng & Units 08/03/2023    5:20 AM 08/02/2023    2:18 AM 08/01/2023    4:00 AM  CMP  Glucose 70 - 99 mg/dL 119  147  81   BUN 8 - 23 mg/dL 28  21  21    Creatinine 0.44 - 1.00 mg/dL 8.29  5.62  1.30   Sodium 135 - 145 mmol/L 136  132  140   Potassium 3.5 - 5.1 mmol/L 3.2  3.5  3.1   Chloride 98 - 111 mmol/L 104  104  110   CO2 22 - 32 mmol/L 21  19  19    Calcium 8.9 - 10.3 mg/dL 8.8  8.7  9.1   Total Protein 6.5 - 8.1 g/dL 5.8  5.3  5.3   Total Bilirubin <1.2 mg/dL  1.1  1.9  2.0   Alkaline Phos 38 - 126 U/L 274  337  289   AST 15 - 41 U/L 151  246  284   ALT 0 - 44 U/L 84  109  124    . Lab Results  Component Value Date   LDH 5,933 (H) 08/01/2023      01/21/2023 Needle Core  Biopsy:           RADIOGRAPHIC STUDIES: .CT ABDOMEN PELVIS W CONTRAST  Result Date: 08/01/2023 CLINICAL DATA:  Nausea and decreased p.o. intake.  Abdominal pain. EXAM: CT ABDOMEN AND PELVIS WITH CONTRAST TECHNIQUE: Multidetector CT imaging of the abdomen and pelvis was performed using the standard protocol following bolus administration of intravenous contrast. RADIATION DOSE REDUCTION: This exam was performed according to the departmental dose-optimization program which includes automated exposure control, adjustment of the mA and/or kV according to patient size and/or use of iterative reconstruction technique. CONTRAST:  80mL OMNIPAQUE IOHEXOL 300 MG/ML  SOLN COMPARISON:  CT abdomen and pelvis 10/29/2022 and PET/CT 05/09/2023 FINDINGS: Lower chest: No acute abnormality. Hepatobiliary: Cholecystectomy. Unremarkable liver and biliary tree. Pancreas: Unremarkable. Spleen: Unremarkable. Adrenals/Urinary Tract: Stable adrenal glands and kidneys. No urinary calculi or hydronephrosis. The bladder is obscured by streak artifact. Stomach/Bowel: Stomach is within normal limits. No bowel obstruction or bowel wall thickening. Vascular/Lymphatic: Aortic atherosclerotic calcification. New enlarged conglomerate lymphadenopathy about the aorta and IVC extending along the right iliac vessels. This measures 3.1 x 8.8 cm in the axial plane on series 2/image 46. Retrocrural node measuring 1.6 cm (series 2/image 21). 1.2 cm porta hepatis node on series 2/image 30. 1.4 cm central mesenteric node on series 2/image 54. Right inguinal lymphadenopathy measuring up to 1.5 cm (series 2/image 84). The adenopathy compresses the inferior IVC and right common iliac vein. Reproductive: Hysterectomy.  Streak artifact limits assessment. Other: Smaller free fluid in the pelvis. No free intraperitoneal air. Musculoskeletal: Bilateral THA. No acute fracture or destructive osseous lesion. IMPRESSION: 1. New enlarged conglomerate  lymphadenopathy about the aorta and IVC extending along the right iliac vessels. Additional enlarged retrocrural, porta hepatis, central mesenteric, and right inguinal lymph nodes. Findings are consistent with progression of lymphoma. Aortic Atherosclerosis (ICD10-I70.0). Electronically Signed   By: Minerva Fester M.D.   On: 08/01/2023 00:13     ASSESSMENT & PLAN:   Cynthia Velasquez is a 72 y.o.  female with   1. Relapsed 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 Plan -Results of excisional lymph node biopsy showing peripheral T-cell lymphoma not otherwise specified which is CD30 negative were discussed in detail with the patient. PET CT scan was previously reviewed and she appears to have at least stage II disease but depending on the liver lesion etiology this could represent stage IV disease. She has had anthracycline exposure as a part of her AC regimen for previous treatment of breast cancer and is therefore limited in her total lifetime anthracycline use. CD 30 negative status precludes use of Adcentris.  2. Anal Squamous Cell Carcinoma, cT2N0M0-currently in remission.  Details as noted above   3. 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 Completed tamoxifen 09/2007 - 09/2012 -2008: stage pT2N0 left breast invasive ductal carcinoma, triple negative, grade 3; s/p lumpectomy in 07/2007 and bilateral mastectomy 11/2007 (path negative for residual carcinoma in both breasts); s/p adjuvant chemo AC-T and radiation  4. H/o Chronic diarrhea -- follows with GI  5.  Hx of neuropathy from previous chemotherapy  6. Extensive previous exposure to chemotherapy.  PLAN: -Discussed lab results from today, 08/11/2023, in detail with the patient. CBC shows slightly decreased hemoglobin of 11.4 g/dL with hematocrit of 16.1% and slightly decreased platelets of 143 K. CMP shows elevated creatinine of 1.27, elevated  Alkaline phosphate level of 430, elevated AST of 229, and elevated ALT of 145.  -Patient will start Dexamethasone.  -Recommend to eat liquid-like food as hematochezia is most likely related to hemorrhoidal bleeding due to constipation.  -Educated patient about Bendamustine treatment and it's duration.  -Answered all of patient's questions regarding Bendamustine treatment and other question regarding lymphoma.  -PET scan results are pending from 08/07/2023.  -Will repeat PET scan after cycle 2.  -Patient can proceed with cycle 1 day 1 of Bendamustine treatment during this visit. Dosage has been adjusted due to elevated liver enzymes. Reduce Bendamustine to 50 mg/m^2.  -Discussed with the patient to importance of talking to family regarding lymphoma progression and treatment.    FOLLOW UP: RTC with Dr Candise Che with labs in 10-12 days for toxicity check.  The total time spent in the appointment was 32 minutes* .  All of the patient's questions were answered with apparent satisfaction. The patient knows to call the clinic with any problems, questions or concerns.   Wyvonnia Lora MD MS AAHIVMS [facility] Hematology/Oncology Physician [facility]  .*Total Encounter Time as defined by the Centers for Medicare and Medicaid Services includes, in addition to the face-to-face time of a patient visit (documented in the note above) non-face-to-face time: obtaining and reviewing outside history, ordering and reviewing medications, tests or procedures, care coordination (communications with other health care professionals or caregivers) and documentation in the medical record.   I,Param Shah,acting as a Neurosurgeon for Wyvonnia Lora, MD.,have documented all relevant documentation on the behalf of Wyvonnia Lora, MD,as directed by  Wyvonnia Lora, MD while in the presence of Wyvonnia Lora, MD.   .I have reviewed the above documentation for accuracy and completeness, and I agree with the above. [name]  MD

## 2023-08-11 NOTE — Patient Instructions (Signed)
[facility] - A DEPT OF [facility]  Discharge Instructions: Thank you for choosing [facility] to provide your oncology and hematology care.   If you have a lab appointment with the Cancer Center, please go directly to the Cancer Center and check in at the registration area.   Wear comfortable clothing and clothing appropriate for easy access to any Portacath or PICC line.   We strive to give you quality time with your provider. You may need to reschedule your appointment if you arrive late (15 or more minutes).  Arriving late affects you and other patients whose appointments are after yours.  Also, if you miss three or more appointments without notifying the office, you may be dismissed from the clinic at the provider's discretion.      For prescription refill requests, have your pharmacy contact our office and allow 72 hours for refills to be completed.    Today you received the following chemotherapy and/or immunotherapy agents Bendeka      To help prevent nausea and vomiting after your treatment, we encourage you to take your nausea medication as directed.  BELOW ARE SYMPTOMS THAT SHOULD BE REPORTED IMMEDIATELY: *FEVER GREATER THAN 100.4 F (38 C) OR HIGHER *CHILLS OR SWEATING *NAUSEA AND VOMITING THAT IS NOT CONTROLLED WITH YOUR NAUSEA MEDICATION *UNUSUAL SHORTNESS OF BREATH *UNUSUAL BRUISING OR BLEEDING *URINARY PROBLEMS (pain or burning when urinating, or frequent urination) *BOWEL PROBLEMS (unusual diarrhea, constipation, pain near the anus) TENDERNESS IN MOUTH AND THROAT WITH OR WITHOUT PRESENCE OF ULCERS (sore throat, sores in mouth, or a toothache) UNUSUAL RASH, SWELLING OR PAIN  UNUSUAL VAGINAL DISCHARGE OR ITCHING   Items with * indicate a potential emergency and should be followed up as soon as possible or go to the Emergency Department if any problems should occur.  Please show the CHEMOTHERAPY ALERT CARD or IMMUNOTHERAPY  ALERT CARD at check-in to the Emergency Department and triage nurse.  Should you have questions after your visit or need to cancel or reschedule your appointment, please contact [facility] - A DEPT OF [facility]  Dept: [phone]  and follow the prompts.  Office hours are 8:00 a.m. to 4:30 p.m. Monday - Friday. Please note that voicemails left after 4:00 p.m. may not be returned until the following business day.  We are closed weekends and major holidays. You have access to a nurse at all times for urgent questions. Please call the main number to the clinic Dept: [phone] and follow the prompts.   For any non-urgent questions, you may also contact your provider using MyChart. We now offer e-Visits for anyone 108 and older to request care online for non-urgent symptoms. For details visit [email or web address].   Also download the MyChart app! Go to the app store, search "MyChart", open the app, select [place], and log in with your MyChart username and password.  Bendamustine Injection What is this medication? BENDAMUSTINE (BEN da MUS teen) treats leukemia and lymphoma. It works by slowing down the growth of cancer cells. This medicine may be used for other purposes; ask your health care provider or pharmacist if you have questions. COMMON BRAND NAME(S): Marilynne Halsted, VIVIMUSTA What should I tell my care team before I take this medication? They need to know if you have any of these conditions: Infection, especially a viral infection, such as chickenpox, cold sores, herpes Kidney disease Liver disease An unusual or allergic reaction to bendamustine,  mannitol, other medications, foods, dyes, or preservatives Pregnant or trying to get pregnant Breast-feeding How should I use this medication? This medication is injected into a vein. It is given by your care team in a hospital or clinic setting. Talk to your care team about the use of this  medication in children. Special care may be needed. Overdosage: If you think you have taken too much of this medicine contact a poison control center or emergency room at once. NOTE: This medicine is only for you. Do not share this medicine with others. What if I miss a dose? Keep appointments for follow-up doses. It is important not to miss your dose. Call your care team if you are unable to keep an appointment. What may interact with this medication? Do not take this medication with any of the following: Clozapine This medication may also interact with the following: Atazanavir Cimetidine Ciprofloxacin Enoxacin Fluvoxamine Medications for seizures, such as carbamazepine, phenobarbital Mexiletine Rifampin Tacrine Thiabendazole Zileuton This list may not describe all possible interactions. Give your health care provider a list of all the medicines, herbs, non-prescription drugs, or dietary supplements you use. Also tell them if you smoke, drink alcohol, or use illegal drugs. Some items may interact with your medicine. What should I watch for while using this medication? Visit your care team for regular checks on your progress. This medication may make you feel generally unwell. This is not uncommon, as chemotherapy can affect healthy cells as well as cancer cells. Report any side effects. Continue your course of treatment even though you feel ill unless your care team tells you to stop. You may need blood work while taking this medication. This medication may increase your risk of getting an infection. Call your care team for advice if you get a fever, chills, sore throat, or other symptoms of a cold or flu. Do not treat yourself. Try to avoid being around people who are sick. This medication may cause serious skin reactions. They can happen weeks to months after starting the medication. Contact your care team right away if you notice fevers or flu-like symptoms with a rash. The rash may be  red or purple and then turn into blisters or peeling of the skin. You may also notice a red rash with swelling of the face, lips, or lymph nodes in your neck or under your arms. In some patients, this medication may cause a serious brain infection that may cause death. If you have any problems seeing, thinking, speaking, walking, or standing, tell your care team right away. If you cannot reach your care team, urgently seek other source of medical care. This medication may increase your risk to bruise or bleed. Call your care team if you notice any unusual bleeding. Talk to your care team about your risk of cancer. You may be more at risk for certain types of cancer if you take this medication. Talk to your care team about your risk of skin cancer. You may be more at risk for skin cancer if you take this medication. Talk to your care team if you or your partner wish to become pregnant or think either of you might be pregnant. This medication can cause serious birth defects if taken during pregnancy or for up to 6 months after the last dose. A negative pregnancy test is required before starting this medication. A reliable form of contraception is recommended while taking this medication and for 6 months after the last dose. Talk to your care team  about reliable forms of contraception. Wear a condom while taking this medication and for at least 3 months after the last dose. Do not breast-feed while taking this medication or for at least 1 week after the last dose. This medication may cause infertility. Talk to your care team if you are concerned about your fertility. What side effects may I notice from receiving this medication? Side effects that you should report to your care team as soon as possible: Allergic reactions--skin rash, itching, hives, swelling of the face, lips, tongue, or throat Infection--fever, chills, cough, sore throat, wounds that don't heal, pain or trouble when passing urine, general  feeling of discomfort or being unwell Infusion reactions--chest pain, shortness of breath or trouble breathing, feeling faint or lightheaded Liver injury--right upper belly pain, loss of appetite, nausea, light-colored stool, dark yellow or brown urine, yellowing skin or eyes, unusual weakness or fatigue Low red blood cell level--unusual weakness or fatigue, dizziness, headache, trouble breathing Painful swelling, warmth, or redness of the skin, blisters or sores at the infusion site Rash, fever, and swollen lymph nodes Redness, blistering, peeling, or loosening of the skin, including inside the mouth Tumor lysis syndrome (TLS)--nausea, vomiting, diarrhea, decrease in the amount of urine, dark urine, unusual weakness or fatigue, confusion, muscle pain or cramps, fast or irregular heartbeat, joint pain Unusual bruising or bleeding Side effects that usually do not require medical attention (report to your care team if they continue or are bothersome): Diarrhea Fatigue Headache Loss of appetite Nausea Vomiting This list may not describe all possible side effects. Call your doctor for medical advice about side effects. You may report side effects to FDA at 1-800-FDA-1088. Where should I keep my medication? This medication is given in a hospital or clinic. It will not be stored at home. NOTE: This sheet is a summary. It may not cover all possible information. If you have questions about this medicine, talk to your doctor, pharmacist, or health care provider.  2024 [employer]/Gold Standard (2021-12-25 00:00:00)

## 2023-08-11 NOTE — Therapy (Addendum)
 OUTPATIENT PHYSICAL THERAPY HEAD AND NECK BASELINE TREATMENT   Patient Name: Elmira Olkowski MRN: [id number] DOB:01/24/1951, 72 y.o., female Today's Date: 08/11/2023  END OF SESSION:  PT End of Session - 08/11/23 1055     Visit Number 7    Number of Visits 12    Date for PT Re-Evaluation 08/11/23    PT Start Time 1212    PT Stop Time 1254    PT Time Calculation (min) 42 min    Activity Tolerance Patient limited by fatigue    Behavior During Therapy [facility] for tasks assessed/performed              Past Medical History:  Diagnosis Date   Anal cancer (HCC) dx'd 05/2019   Anxiety    Arthritis    Bilateral breast cancer (HCC) 10/21/2013   Breast cancer (HCC)    Cancer (HCC)    Depression    Gallstones    GERD (gastroesophageal reflux disease)    doesn't take any meds for this   Headache    h/o migraines       History of bladder infections    History of hiatal hernia    History of migraine    Hyperlipidemia    takes Simvastatin  daily   Hypertension    takes Hyzaar   Joint pain    Joint swelling    Lumbar stenosis    Lymphoma (HCC)    NHL   Neuropathy 09/07/2013   Pneumonia    Pre-diabetes    Past Surgical History:  Procedure Laterality Date   ABDOMINAL HYSTERECTOMY     bone spur removed from left foot     CHOLECYSTECTOMY     COLONOSCOPY     COLONOSCOPY WITH PROPOFOL  Bilateral 12/07/2021   Procedure: COLONOSCOPY WITH PROPOFOL ;  Surgeon: Rosalie Kitchens, MD;  Location: [facility];  Service: Endoscopy;  Laterality: Bilateral;   double mastectomy      ESOPHAGOGASTRODUODENOSCOPY N/A 12/07/2021   Procedure: ESOPHAGOGASTRODUODENOSCOPY (EGD);  Surgeon: Rosalie Kitchens, MD;  Location: [facility];  Service: Endoscopy;  Laterality: N/A;   HERNIA REPAIR     umbilical   IR IMAGING GUIDED PORT INSERTION  06/25/2022   right knee arthroscopy     right shoulder arthroscopy     surgery for hiatal hernia     TONSILLECTOMY     TOTAL HIP ARTHROPLASTY Left 02/12/2013   TOTAL HIP  ARTHROPLASTY Left 02/12/2013   Procedure: LEFT TOTAL HIP ARTHROPLASTY;  Surgeon: Dempsey JINNY Sensor, MD;  Location: [facility];  Service: Orthopedics;  Laterality: Left;   TOTAL HIP ARTHROPLASTY Right 05/03/2013   Procedure: TOTAL HIP ARTHROPLASTY;  Surgeon: Dempsey JINNY Sensor, MD;  Location: [facility];  Service: Orthopedics;  Laterality: Right;   TOTAL KNEE ARTHROPLASTY Right 02/27/2015   Procedure: TOTAL KNEE ARTHROPLASTY;  Surgeon: Dempsey Sensor, MD;  Location: [facility];  Service: Orthopedics;  Laterality: Right;   TOTAL SHOULDER ARTHROPLASTY Right 09/12/2016   Procedure: RIGHT TOTAL SHOULDER ARTHROPLASTY;  Surgeon: Eva Herring, MD;  Location: [facility];  Service: Orthopedics;  Laterality: Right;  RIGHT TOTAL SHOULDER ARTHROPLASTY   Patient Active Problem List   Diagnosis Date Noted   Biliary obstruction 08/01/2023   Transaminitis 08/01/2023   Generalized abdominal pain 08/01/2023   AKI (acute kidney injury) (HCC) 08/01/2023   Depression 08/01/2023   GAD (generalized anxiety disorder) 08/01/2023   Syncope and collapse 12/29/2022   Hypomagnesemia 12/29/2022   Perianal pain 11/05/2022   Vaginal candidiasis 11/04/2022   Hypokalemia 10/28/2022   Pancytopenia (  HCC) 10/28/2022   Port-A-Cath in place 10/01/2022   Chemotherapy-induced neuropathy (HCC) 09/03/2022   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   Incontinence of feces 09/28/2020   Essential hypertension 09/27/2020   GERD without esophagitis 09/27/2020   History of bilateral mastectomy 09/27/2020   Hyperglycemia 09/27/2020   Moderate recurrent major depression (HCC) 09/27/2020   Pure hypercholesterolemia 09/27/2020   Anal cancer (HCC) 06/13/2019   Status post total shoulder arthroplasty, right 09/12/2016   Arthritis of knee 02/27/2015   Primary osteoarthritis of right knee Valgus 02/24/2015   Neuropathy 09/07/2013   Breast cancer of upper-outer quadrant of left female breast (HCC) 08/09/2013   Breast cancer of upper-outer quadrant of right female breast  (HCC) 08/09/2013   Osteoarthritis of right hip 05/03/2013   Osteoarthritis of left hip 02/15/2013    PCP: Dr. 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 I fell at home after I got out of the hospital. I was face down in the hallway. I was on the floor for 30-40 min trying to get up. I did not go to the doctor after that. I have been off balance ever since. I only have pain where my teeth hit my lip when I fell. I am going to start another round of chemo today - the stronger one. I have neuropathy very badly in my feet and my hands.   PERTINENT HISTORY:  Bil breast cancer in 2008 with lumpectomy and SLNB, then bil mastectomy, Completed chemo and radiation. Nodes removed bil axilla - pt unsure of how many (surgery was in [place] )  Then Anal cancer in 2020 with chemotherapy and radiation. Diagnosis of T cell lymphoma in 2023. on chemotherapy and just started palliative radiation to bilateral neck will complete on 06/16/23. Recent hospitalization from 11/14/to 11/17 for biliary obstruction.   PATIENT GOALS:   to be educated about the signs and symptoms of lymphedema and learn post op HEP.   PAIN:  Are you having pain? No  PRECAUTIONS: Active CA and Comment (R shoulder replacement, R knee replacement, R THA, L THA)  RED  FLAGS: Bowel or bladder incontinence: Yes: hx of anal cancer   WEIGHT BEARING RESTRICTIONS: No  FALLS:  Has patient fallen in last 6 months? No Does the patient have a fear of falling that limits activity? 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  LEISURE: walks 2-3x/wk for about 15 min  PRIOR LEVEL OF FUNCTION: Independent   OBJECTIVE:  COGNITION: Overall cognitive status: Within functional limits for tasks assessed  POSTURE:  Forward head and rounded shoulders posture  30 SEC SIT TO STAND:  Unable to stand from a chair without use of UEs  SHOULDER AROM:   L WFL, R limited by about 60% and reports it has been this way since shoulder replacement   CERVICAL AROM:   Percent limited 07/14/23  Flexion 50% limited and feels swelling is limiting motion WFL  Extension 25% limited 25% limited  Right lateral flexion 25% limited 25% limited  Left lateral flexion [facility] WFL  Right rotation 25% limited WFL  Left rotation 25% limited WFL    (Blank rows=not tested)  LYMPHEDEMA ASSESSMENT:    Circumference in cm 07/14/23  4 cm superior to sternal notch around neck 39.9 37.5  6 cm superior to sternal notch around neck 39 36.5  8 cm superior to sternal notch around neck 39.4 36.9  R lateral nostril from base of nose to medial tragus 11.9 12  L lateral nostril from base of nose to medial tragus 12.2 12.2  R corner of mouth to where ear lobe meets face 10.5 10.5  L corner of mouth to where ear lobe meets face 10 11          (Blank rows=not tested)  GAIT: Assessed: Yes Assistance needed: Modified Independent Ambulation Distance: 20 feet Assistive Device: none Gait  pattern: partial step through, shortened step length, decreased foot clearance, decreased arm swing bilaterally Ambulation surface: Level  LOWER EXTREMITY STRENGTH:  MMT Right eval RIGHT 08/11/23  Hip flexion 3/5 2+/5  Hip extension 2+/5   Hip abduction 3/5   Hip adduction    Hip internal rotation    Hip external rotation    Knee flexion 3+/5 5/5  Knee extension 4/5 3/5  Ankle dorsiflexion 5/5 5/5  Ankle plantarflexion    Ankle inversion    Ankle eversion     (Blank rows = not tested)  MMT LEFT eval LEFT  08/11/23  Hip flexion 4/5 2+/5  Hip extension 2/5   Hip abduction 2+/5   Hip adduction    Hip internal rotation    Hip external rotation    Knee flexion 4/5 4/5  Knee extension 5/5 5/5  Ankle dorsiflexion 5/5 5/5  Ankle plantarflexion    Ankle inversion    Ankle eversion     (Blank rows = not tested)  BALANCE: 07/14/23: 13 sec on L    2 sec on R  TREATMENT PERFORMED: 08/11/23: Assessed strength s/p recent hospitilization  Pt very fatigued and reports increased difficulty with walking and balance Discussed energy conservation methods. Pt was planning on grocery shopping after this before her chemo appointment today - see assessment section for more details  07/30/23:  Nustep - seat at 9, no UEs, level 3 x 10 min 23 sec 571 steps Seated hamstring stretch x 2 reps x 30 sec holds with pt returning therapist demo Seated piriformis stretch x 2 reps with 30 sec holds with pt returning therapist demo Seated LAQ with 2lb ankle weights with 5 sec holds x 10 reps each with pt returning therapist demo Seated marching with 2 lb ankle weights x 10 reps bilaterally, with increased clearance on the L compared to R Seated hip abduction with red theraband x 10 reps with 3 sec holds and v/c for slow movement throughout 3 way hip in bars x 10 reps 2#  07/23/23:  Seated hamstring stretch x 2 reps x 30 sec holds with pt returning therapist demo Seated piriformis stretch x 2 reps  with 30 sec holds with pt returning therapist demo Seated TKE with 2lb ankle weights with 5 sec holds x 10 reps each with pt returning therapist demo Seated marching with 2 lb ankle weights x 10 reps bilaterally, with increased clearance on the L compared to R Seated hip abduction with red theraband x 10 reps with 3 sec holds and v/c for slow movement throughout  07/21/23:  Seated TKE with 2lb ankle weights with 5 sec holds x 10 reps each with pt returning therapist demo Seated marching with 2 lb ankle weights x 1 reps bilaterally, was too much resistance so removed ankle weights Seated hip abduction with red theraband x 10 reps with 3 sec holds and v/c for slow movement throughout Nustep - seat at 9, no UEs, level 3 x 10 min 23 sec 571 steps\ 3 way hip machine on 25 lbs x 10 reps each with pt feeling challenged, pt returned therapist demo Seated hamstring stretch x 2 reps x 30 sec holds with pt returning therapist demo Seated piriformis stretch x 2 reps with 30 sec holds with pt returning therapist demo  07/14/23- Reassess  07/10/23: See assessment  07/02/23: Instructed pt using Norton anterior approach handout as follows: short neck, 5 diaphragmatic breaths, bilateral axillary nodes, bilateral pectoral nodes, anterior chest, short neck, posterior neck moving fluid towards pathway aimed at lateral neck, then lateral and anterior neck moving fluid towards pathway aimed at lateral neck then retracing all steps. Had pt return demonstrate each step and provided v/c and t/c for correct skin stretch technique and sequence. Pt required mod cueing for proximal steps but required less cueing during steps on the neck. Pt completed first half of the sequence with therapist v/c and t/c and then therapist completed 2nd half of session.   06/30/23: Instructed pt using Norton anterior approach handout as follows: short neck, 5 diaphragmatic breaths, bilateral axillary nodes, bilateral pectoral nodes, anterior  chest, short neck, posterior neck moving fluid towards pathway aimed at lateral neck, then lateral and anterior neck moving fluid towards pathway aimed at lateral neck then retracing all steps. Had pt return demonstrate each step and provided v/c and t/c for correct skin stretch tehcnique and sequence. Issued handout for pt to practice    06/12/23: Created foam chip pack in juzo cotton stockinette for pt to wear around head and neck for compression to decrease lymphedema  PATIENT EDUCATION:  Education details: how to obtain a head and neck compression garment for long term use, sleeve for prophylactic use Person educated: Patient Education method: Explanation, Handout Education comprehension: Patient verbalized understanding  HOME EXERCISE PROGRAM: Self MLD for head and neck Wear compression garment at least 4 hours a day  ASSESSMENT:  CLINICAL IMPRESSION:  Pt was recently hospitalized for biliary obstruction and acute kidney injury. Her hematocrit is low. She reports recent onset of rectal bleeding since Friday that occurs when she sits. She reports that she has not discussed this with her doctor yet but plans to do so today. She had a recent PET scan but it has not been read yet. She is starting a new chemo today and is concerned about the side effects. Her gait speed is slower today and pt is ambulating with her cane and fatigues quickly. She was planning on grocery shopping. Discussed asking for assistance at home because grocery shopping takes a lot of energy and maybe asking for help with this or doing an online pick up order to decrease the energy required. Discussed  asking for help for things at home to help with conserving energy and to not push through because that can increase her fall risk if she is feeling fatigued. Will place pt on hold currently and then resume once she figures out how she will tolerate her new chemo. Walked pt out to her car today to make sure she made it safely.  Encouraged her to use the valet at the cancer center and to ask for a wheelchair due to her balance and fatigue levels. She required as seated recovery period walking from the treatment room to her car today.   Pt will benefit from skilled therapeutic intervention to improve on the following deficits: Decreased knowledge of precautions and postural dysfunction. Other deficits: decreased knowledge of condition, decreased knowledge of use of DME, difficulty walking, decreased strength, increased edema, increased fascial restrictions, postural dysfunction, and pain  PT treatment/interventions: ADL/self-care home management, pt/family education, therapeutic exercise. Other interventions 97164- PT Re-evaluation, 97110-Therapeutic exercises, 97530- Therapeutic activity, [id number]- Neuromuscular re-education, 97535- Self Care, [id number]- Manual therapy, 97760- Orthotic Fit/training, 97016- Vasopneumatic device, Patient/Family education, Taping, Joint mobilization, Manual lymph drainage, and Compression bandaging  REHAB POTENTIAL: Good  CLINICAL DECISION MAKING: Stable/uncomplicated  EVALUATION COMPLEXITY: Low   GOALS: Goals reviewed with patient? YES  LONG TERM GOALS: (STG=LTG)   Name Target Date  Goal status  1 Patient will be able to verbalize understanding of a home exercise program for cervical range of motion, posture, and walking.   Baseline:  No knowledge 06/12/2023 Achieved at eval  2 Patient will be able to verbalize understanding of proper sitting and standing posture. Baseline:  No knowledge 06/12/2023 Achieved at eval  3 Patient will be able to verbalize understanding of lymphedema risk and availability of treatment for this condition Baseline:  No knowledge 06/12/2023 Achieved at eval  4 Pt will be independent in self MLD for long term management of lymphedema. Baseline: See objective measurements taken today. 07/10/23 ONGOING 07/14/23  5 Pt will obtain an appropriate compression garmetn for  long term management of lymphedema. 07/10/23 IN PROGRESS 07/14/23 - pt plans to purchase online  6 Pt will demonstrate a 0.5 cm decrease in edema from R corner of mouth to where ear lobe meets face. 07/10/23 DEFERRED - pt does not feel like her face is swelling but her neck swelling has decreased 07/14/23  7 Pt will be able to complete SLS for 30 sec bilaterally to decrease fall risk. 08/11/23 NEW   8 Pt will demonstrate 4/5 bilateral hip flexion strength to decrease fall risk. 08/11/23 NEW   9 Pt will demonstrate 4/5 bilateral hip abductor strength to decrease fall risk. 08/11/23 NEW  10  Pt will be independent in a home exercise program for continued stretching and strengthening. 08/11/23 NEW     PLAN:  PT FREQUENCY/DURATION: 2x/wk for 4 wks  PLAN FOR NEXT SESSION: reassess after starting chemo   Physical Therapy Information for During and After Head/Neck Cancer Treatment: Lymphedema is a swelling condition that you may be at risk for in your neck and/or face if you have radiation treatment to the area and/or if you have surgery that includes removing lymph nodes.  There is treatment available for this condition and it is not life-threatening.  Contact your physician or physical therapist with concerns. An excellent resource for those seeking information on lymphedema is the National Lymphedema Network's website.  It can be accessed at www.lymphnet.org If you notice swelling in your neck or face at any time following surgery (  even if it is many years from now), please contact your doctor or physical therapist to discuss this.  Lymphedema can be treated at any time but it is easier for you if it is treated early on. If you have had surgery to your neck, please check with your surgeon about how soon to start doing neck range of motion exercises.  If you are not having surgery, I encourage you to start doing neck range of motion exercises today and continue these while undergoing treatment,  UNLESS you have irritation of your skin or soft tissue that is aggravated by doing them.  These exercises are intended to help you prevent loss of range of motion and/or to gain range of motion in your neck (which can be limited by tightening effects of radiation), and NOT to aggravate these tissues if they develop sensitivities from treatment. Neck range of motion exercises should be done to the point of feeling a GENTLE, TOLERABLE stretch only.  You are encouraged to start a walking or other exercise program tomorrow and continue this as much as you are able through and after treatment.  Please feel free to call me with any questions. Florina Lanis Carbon, PT, CLT Physical Therapist and Certified Lymphedema Therapist [facility] [address], [address], [place], [place] [address] [phone] [email or web address]  WALKING  Walking is a great form of exercise to increase your strength, endurance and overall fitness.  A walking program can help you start slowly and gradually build endurance as you go.  Everyone's ability is different, so each person's starting point will be different.  You do not have to follow them exactly.  The are just samples. You should simply find out what's right for you and stick to that program.   In the beginning, you'll start off walking 2-3 times a day for short distances.  As you get stronger, you'll be walking further at just 1-2 times per day.  A. You Can Walk For A Certain Length Of Time Each Day    Walk 5 minutes 3 times per day.  Increase 2 minutes every 2 days (3 times per day).  Work up to 25-30 minutes (1-2 times per day).   Example:   Day 1-2 5 minutes 3 times per day   Day 7-8 12 minutes 2-3 times per day   Day 13-14 25 minutes 1-2 times per day  B. You Can Walk For a Certain Distance Each Day     Distance can be substituted for time.    Example:   3 trips to mailbox (at road)   3 trips to corner of block   3  trips around the block  C. Go to local high school and use the track.    Walk for distance ____ around track  Or time ____ minutes  D. Walk ____ Jog ____ Run ___   Why exercise?  So many benefits! Here are SOME of them: Heart health, including raising your good cholesterol level and reducing heart rate and blood pressure Lung health, including improved lung capacity It burns fats, and most of us  can stand to be leaner, whether or not we are overweight. It increases the body's natural painkillers and mood elevators, so makes you feel better. Not only makes you feel better, but look better too Improves sleep Takes a bite out of stress May decrease your risk of many types of cancer If you are currently undergoing cancer treatment, exercise may improve your ability to tolerate treatments  including chemotherapy. For everybody, it can improve your energy level. Those with cancer-related fatigue report a 40-50% reduction in this symptom when exercising regularly. If you are a survivor of breast, colon, or prostate cancer, it may decrease your risk of a recurrence. (This may hold for other cancers too, but so far we have data just for these three types.)  How to exercise: Get your doctor's okay. Pick something you enjoy doing, like walking, Zumba, biking, swimming, or whatever. Start at low intensity and time, then gradually increase.  (See walking program handout.) Set a goal to achieve over time.  The American Cancer Society, American Heart Association, and U.S. Dept. of Health and Human Services recommend 150 minutes of moderate exercise, 75 minutes of vigorous exercise, or a combination of both per week. This should be done in episodes at least 10 minutes long, spread throughout the week.  Need help being motivated? Pick something you enjoy doing, because you'll be more inclined to stick with that activity than something that feels like a chore. Do it with a friend so that you are  accountable to each other. Schedule it into your day. Place it on your calendar and keep that appointment just like you do any appointment that you make. Join an exercise group that meets at a specific time.  That way, you have to show up on time, and that makes it harder to procrastinate about doing your workout.  It also keeps you accountable--people begin to expect you to be there. Join a gym where you feel comfortable and not intimidated, at the right cost. Sign up for something that you'll need to be in shape for on a specific date, like a 1K or a 5K to walk or run, a 20 or 30 mile bike ride, a mud run or something like that. If the date is looming, you know you'll need to train to be ready for it.  An added benefit is that many of these are fundraisers for good causes. If you've already paid for a gym membership, group exercise class or event, you might as well work out, so you haven't wasted your money!    [facility] [place], PT 08/11/2023, 11:04 AM                       PHYSICAL THERAPY DISCHARGE SUMMARY  Visits from Start of Care: 7  Current functional level related to goals / functional outcomes: See above   Remaining deficits: See above   Education / Equipment: HEP, MLD   Patient agrees to discharge. Patient goals were partially met. Patient is being discharged due to not returning since the last visit.  Florina Sever [place], [place] 05/18/24 10:27 AM

## 2023-08-12 ENCOUNTER — Inpatient Hospital Stay: Payer: PPO

## 2023-08-12 ENCOUNTER — Encounter: Payer: Self-pay | Admitting: Neurology

## 2023-08-12 VITALS — BP 101/67 | HR 86 | Temp 97.9°F | Resp 16

## 2023-08-12 DIAGNOSIS — C8590 Non-Hodgkin lymphoma, unspecified, unspecified site: Secondary | ICD-10-CM

## 2023-08-12 DIAGNOSIS — Z5111 Encounter for antineoplastic chemotherapy: Secondary | ICD-10-CM | POA: Diagnosis not present

## 2023-08-12 MED ORDER — HEPARIN SOD (PORK) LOCK FLUSH 100 UNIT/ML IV SOLN
500.0000 [IU] | Freq: Once | INTRAVENOUS | Status: AC | PRN
Start: 2023-08-12 — End: 2023-08-12
  Administered 2023-08-12: 500 [IU]

## 2023-08-12 MED ORDER — SODIUM CHLORIDE 0.9 % IV SOLN
50.0000 mg/m2 | Freq: Once | INTRAVENOUS | Status: AC
Start: 2023-08-12 — End: 2023-08-12
  Administered 2023-08-12: 100 mg via INTRAVENOUS
  Filled 2023-08-12: qty 4

## 2023-08-12 MED ORDER — SODIUM CHLORIDE 0.9 % IV SOLN
INTRAVENOUS | Status: DC
Start: 2023-08-12 — End: 2023-08-12

## 2023-08-12 MED ORDER — SODIUM CHLORIDE 0.9% FLUSH
10.0000 mL | INTRAVENOUS | Status: DC | PRN
  Administered 2023-08-12: 10 mL

## 2023-08-12 MED ORDER — DEXAMETHASONE SODIUM PHOSPHATE 10 MG/ML IJ SOLN
10.0000 mg | Freq: Once | INTRAMUSCULAR | Status: AC
Start: 2023-08-12 — End: 2023-08-12
  Administered 2023-08-12: 10 mg via INTRAVENOUS
  Filled 2023-08-12: qty 1

## 2023-08-12 NOTE — Patient Instructions (Signed)
[facility] - A DEPT OF [facility]  Discharge Instructions: Thank you for choosing [facility] to provide your oncology and hematology care.   If you have a lab appointment with the Cancer Center, please go directly to the Cancer Center and check in at the registration area.   Wear comfortable clothing and clothing appropriate for easy access to any Portacath or PICC line.   We strive to give you quality time with your provider. You may need to reschedule your appointment if you arrive late (15 or more minutes).  Arriving late affects you and other patients whose appointments are after yours.  Also, if you miss three or more appointments without notifying the office, you may be dismissed from the clinic at the provider's discretion.      For prescription refill requests, have your pharmacy contact our office and allow 72 hours for refills to be completed.    Today you received the following chemotherapy and/or immunotherapy agents: Bendamustine [employer])      To help prevent nausea and vomiting after your treatment, we encourage you to take your nausea medication as directed.  BELOW ARE SYMPTOMS THAT SHOULD BE REPORTED IMMEDIATELY: *FEVER GREATER THAN 100.4 F (38 C) OR HIGHER *CHILLS OR SWEATING *NAUSEA AND VOMITING THAT IS NOT CONTROLLED WITH YOUR NAUSEA MEDICATION *UNUSUAL SHORTNESS OF BREATH *UNUSUAL BRUISING OR BLEEDING *URINARY PROBLEMS (pain or burning when urinating, or frequent urination) *BOWEL PROBLEMS (unusual diarrhea, constipation, pain near the anus) TENDERNESS IN MOUTH AND THROAT WITH OR WITHOUT PRESENCE OF ULCERS (sore throat, sores in mouth, or a toothache) UNUSUAL RASH, SWELLING OR PAIN  UNUSUAL VAGINAL DISCHARGE OR ITCHING   Items with * indicate a potential emergency and should be followed up as soon as possible or go to the Emergency Department if any problems should occur.  Please show the CHEMOTHERAPY ALERT CARD or  IMMUNOTHERAPY ALERT CARD at check-in to the Emergency Department and triage nurse.  Should you have questions after your visit or need to cancel or reschedule your appointment, please contact [facility] - A DEPT OF [facility]  Dept: [phone]  and follow the prompts.  Office hours are 8:00 a.m. to 4:30 p.m. Monday - Friday. Please note that voicemails left after 4:00 p.m. may not be returned until the following business day.  We are closed weekends and major holidays. You have access to a nurse at all times for urgent questions. Please call the main number to the clinic Dept: [phone] and follow the prompts.   For any non-urgent questions, you may also contact your provider using MyChart. We now offer e-Visits for anyone 75 and older to request care online for non-urgent symptoms. For details visit [email or web address].   Also download the MyChart app! Go to the app store, search "MyChart", open the app, select [place], and log in with your MyChart username and password.

## 2023-08-13 ENCOUNTER — Other Ambulatory Visit: Payer: Self-pay

## 2023-08-13 ENCOUNTER — Telehealth: Payer: Self-pay

## 2023-08-13 NOTE — Telephone Encounter (Signed)
-----   Message from Nurse Julieanne Manson sent at 08/11/2023  4:00 PM EST ----- Regarding: Candise Che 1st Tx F/U call - Doristine Bosworth 1st Tx F/U call - [place].  Tolerated first tx well today, another tx tomorrow (11/26).

## 2023-08-13 NOTE — Telephone Encounter (Signed)
Cynthia Velasquez states that she is doing fine.  She is eating, drinking, and urinating well.  She knows to call the office at [phone] if she has any questions or concerns.

## 2023-08-18 ENCOUNTER — Encounter: Payer: Self-pay | Admitting: Hematology

## 2023-08-24 ENCOUNTER — Emergency Department (HOSPITAL_COMMUNITY): Payer: PPO

## 2023-08-24 ENCOUNTER — Inpatient Hospital Stay (HOSPITAL_COMMUNITY)
Admission: EM | Admit: 2023-08-24 | Discharge: 2023-09-10 | DRG: 840 | Disposition: A | Discharge status: Hospice | Payer: PPO | Attending: Internal Medicine | Admitting: Internal Medicine

## 2023-08-24 ENCOUNTER — Encounter (HOSPITAL_COMMUNITY): Payer: Self-pay | Admitting: Emergency Medicine

## 2023-08-24 ENCOUNTER — Observation Stay (HOSPITAL_COMMUNITY): Payer: PPO

## 2023-08-24 ENCOUNTER — Other Ambulatory Visit: Payer: Self-pay

## 2023-08-24 DIAGNOSIS — K219 Gastro-esophageal reflux disease without esophagitis: Secondary | ICD-10-CM | POA: Diagnosis present

## 2023-08-24 DIAGNOSIS — Z96611 Presence of right artificial shoulder joint: Secondary | ICD-10-CM | POA: Diagnosis present

## 2023-08-24 DIAGNOSIS — Z888 Allergy status to other drugs, medicaments and biological substances status: Secondary | ICD-10-CM

## 2023-08-24 DIAGNOSIS — E8809 Other disorders of plasma-protein metabolism, not elsewhere classified: Secondary | ICD-10-CM | POA: Diagnosis present

## 2023-08-24 DIAGNOSIS — R17 Unspecified jaundice: Secondary | ICD-10-CM | POA: Diagnosis not present

## 2023-08-24 DIAGNOSIS — D6959 Other secondary thrombocytopenia: Secondary | ICD-10-CM | POA: Diagnosis present

## 2023-08-24 DIAGNOSIS — E876 Hypokalemia: Secondary | ICD-10-CM | POA: Diagnosis present

## 2023-08-24 DIAGNOSIS — Z79899 Other long term (current) drug therapy: Secondary | ICD-10-CM | POA: Diagnosis not present

## 2023-08-24 DIAGNOSIS — Z7189 Other specified counseling: Secondary | ICD-10-CM | POA: Diagnosis not present

## 2023-08-24 DIAGNOSIS — I700 Atherosclerosis of aorta: Secondary | ICD-10-CM | POA: Diagnosis not present

## 2023-08-24 DIAGNOSIS — T451X5A Adverse effect of antineoplastic and immunosuppressive drugs, initial encounter: Secondary | ICD-10-CM | POA: Diagnosis present

## 2023-08-24 DIAGNOSIS — Z90710 Acquired absence of both cervix and uterus: Secondary | ICD-10-CM | POA: Diagnosis not present

## 2023-08-24 DIAGNOSIS — R531 Weakness: Secondary | ICD-10-CM

## 2023-08-24 DIAGNOSIS — G131 Other systemic atrophy primarily affecting central nervous system in neoplastic disease: Secondary | ICD-10-CM | POA: Diagnosis present

## 2023-08-24 DIAGNOSIS — C8440 Peripheral T-cell lymphoma, not classified, unspecified site: Secondary | ICD-10-CM | POA: Diagnosis not present

## 2023-08-24 DIAGNOSIS — Z9013 Acquired absence of bilateral breasts and nipples: Secondary | ICD-10-CM

## 2023-08-24 DIAGNOSIS — Z9049 Acquired absence of other specified parts of digestive tract: Secondary | ICD-10-CM | POA: Diagnosis not present

## 2023-08-24 DIAGNOSIS — Z87440 Personal history of urinary (tract) infections: Secondary | ICD-10-CM

## 2023-08-24 DIAGNOSIS — G928 Other toxic encephalopathy: Secondary | ICD-10-CM | POA: Diagnosis not present

## 2023-08-24 DIAGNOSIS — K5900 Constipation, unspecified: Secondary | ICD-10-CM | POA: Diagnosis present

## 2023-08-24 DIAGNOSIS — R590 Localized enlarged lymph nodes: Secondary | ICD-10-CM | POA: Diagnosis not present

## 2023-08-24 DIAGNOSIS — R001 Bradycardia, unspecified: Secondary | ICD-10-CM | POA: Diagnosis not present

## 2023-08-24 DIAGNOSIS — R54 Age-related physical debility: Secondary | ICD-10-CM | POA: Diagnosis present

## 2023-08-24 DIAGNOSIS — R0902 Hypoxemia: Secondary | ICD-10-CM | POA: Diagnosis not present

## 2023-08-24 DIAGNOSIS — R748 Abnormal levels of other serum enzymes: Secondary | ICD-10-CM

## 2023-08-24 DIAGNOSIS — E870 Hyperosmolality and hypernatremia: Secondary | ICD-10-CM | POA: Diagnosis present

## 2023-08-24 DIAGNOSIS — N179 Acute kidney failure, unspecified: Secondary | ICD-10-CM | POA: Diagnosis present

## 2023-08-24 DIAGNOSIS — K529 Noninfective gastroenteritis and colitis, unspecified: Secondary | ICD-10-CM | POA: Diagnosis present

## 2023-08-24 DIAGNOSIS — G9340 Encephalopathy, unspecified: Secondary | ICD-10-CM | POA: Diagnosis not present

## 2023-08-24 DIAGNOSIS — Z96651 Presence of right artificial knee joint: Secondary | ICD-10-CM | POA: Diagnosis present

## 2023-08-24 DIAGNOSIS — Z85048 Personal history of other malignant neoplasm of rectum, rectosigmoid junction, and anus: Secondary | ICD-10-CM

## 2023-08-24 DIAGNOSIS — Z885 Allergy status to narcotic agent status: Secondary | ICD-10-CM

## 2023-08-24 DIAGNOSIS — G893 Neoplasm related pain (acute) (chronic): Secondary | ICD-10-CM | POA: Diagnosis not present

## 2023-08-24 DIAGNOSIS — Z96643 Presence of artificial hip joint, bilateral: Secondary | ICD-10-CM | POA: Diagnosis present

## 2023-08-24 DIAGNOSIS — C8442 Peripheral T-cell lymphoma, not classified, intrathoracic lymph nodes: Principal | ICD-10-CM | POA: Diagnosis present

## 2023-08-24 DIAGNOSIS — T380X5A Adverse effect of glucocorticoids and synthetic analogues, initial encounter: Secondary | ICD-10-CM | POA: Diagnosis not present

## 2023-08-24 DIAGNOSIS — K30 Functional dyspepsia: Secondary | ICD-10-CM | POA: Diagnosis not present

## 2023-08-24 DIAGNOSIS — R4589 Other symptoms and signs involving emotional state: Secondary | ICD-10-CM | POA: Diagnosis not present

## 2023-08-24 DIAGNOSIS — R0989 Other specified symptoms and signs involving the circulatory and respiratory systems: Secondary | ICD-10-CM | POA: Diagnosis not present

## 2023-08-24 DIAGNOSIS — Z853 Personal history of malignant neoplasm of breast: Secondary | ICD-10-CM

## 2023-08-24 DIAGNOSIS — Z85528 Personal history of other malignant neoplasm of kidney: Secondary | ICD-10-CM

## 2023-08-24 DIAGNOSIS — R4182 Altered mental status, unspecified: Secondary | ICD-10-CM | POA: Diagnosis not present

## 2023-08-24 DIAGNOSIS — Z515 Encounter for palliative care: Secondary | ICD-10-CM | POA: Diagnosis not present

## 2023-08-24 DIAGNOSIS — D61810 Antineoplastic chemotherapy induced pancytopenia: Secondary | ICD-10-CM | POA: Diagnosis present

## 2023-08-24 DIAGNOSIS — C8590 Non-Hodgkin lymphoma, unspecified, unspecified site: Secondary | ICD-10-CM | POA: Diagnosis not present

## 2023-08-24 DIAGNOSIS — R627 Adult failure to thrive: Secondary | ICD-10-CM | POA: Diagnosis present

## 2023-08-24 DIAGNOSIS — Z66 Do not resuscitate: Secondary | ICD-10-CM | POA: Diagnosis present

## 2023-08-24 DIAGNOSIS — R7303 Prediabetes: Secondary | ICD-10-CM | POA: Diagnosis present

## 2023-08-24 DIAGNOSIS — G9341 Metabolic encephalopathy: Secondary | ICD-10-CM | POA: Diagnosis present

## 2023-08-24 DIAGNOSIS — I129 Hypertensive chronic kidney disease with stage 1 through stage 4 chronic kidney disease, or unspecified chronic kidney disease: Secondary | ICD-10-CM | POA: Diagnosis present

## 2023-08-24 DIAGNOSIS — K7290 Hepatic failure, unspecified without coma: Secondary | ICD-10-CM | POA: Diagnosis present

## 2023-08-24 DIAGNOSIS — N1832 Chronic kidney disease, stage 3b: Secondary | ICD-10-CM | POA: Diagnosis present

## 2023-08-24 DIAGNOSIS — Z1152 Encounter for screening for COVID-19: Secondary | ICD-10-CM

## 2023-08-24 DIAGNOSIS — D709 Neutropenia, unspecified: Secondary | ICD-10-CM

## 2023-08-24 DIAGNOSIS — R601 Generalized edema: Secondary | ICD-10-CM | POA: Diagnosis not present

## 2023-08-24 DIAGNOSIS — E785 Hyperlipidemia, unspecified: Secondary | ICD-10-CM | POA: Diagnosis present

## 2023-08-24 DIAGNOSIS — F419 Anxiety disorder, unspecified: Secondary | ICD-10-CM | POA: Diagnosis present

## 2023-08-24 DIAGNOSIS — K859 Acute pancreatitis without necrosis or infection, unspecified: Secondary | ICD-10-CM | POA: Diagnosis present

## 2023-08-24 DIAGNOSIS — Z8701 Personal history of pneumonia (recurrent): Secondary | ICD-10-CM

## 2023-08-24 DIAGNOSIS — M1990 Unspecified osteoarthritis, unspecified site: Secondary | ICD-10-CM | POA: Diagnosis present

## 2023-08-24 DIAGNOSIS — D696 Thrombocytopenia, unspecified: Secondary | ICD-10-CM

## 2023-08-24 DIAGNOSIS — F32A Depression, unspecified: Secondary | ICD-10-CM | POA: Diagnosis present

## 2023-08-24 DIAGNOSIS — E860 Dehydration: Secondary | ICD-10-CM | POA: Diagnosis present

## 2023-08-24 DIAGNOSIS — I10 Essential (primary) hypertension: Secondary | ICD-10-CM | POA: Diagnosis present

## 2023-08-24 LAB — CBC WITH DIFFERENTIAL/PLATELET
Abs Immature Granulocytes: 0.07 10*3/uL (ref 0.00–0.07)
Basophils Absolute: 0 10*3/uL (ref 0.0–0.1)
Basophils Relative: 2 %
Eosinophils Absolute: 0 10*3/uL (ref 0.0–0.5)
Eosinophils Relative: 0 %
HCT: 41.1 % (ref 36.0–46.0)
Hemoglobin: 13.2 g/dL (ref 12.0–15.0)
Immature Granulocytes: 5 %
Lymphocytes Relative: 20 %
Lymphs Abs: 0.3 10*3/uL — ABNORMAL LOW (ref 0.7–4.0)
MCH: 28.4 pg (ref 26.0–34.0)
MCHC: 32.1 g/dL (ref 30.0–36.0)
MCV: 88.6 fL (ref 80.0–100.0)
Monocytes Absolute: 0.1 10*3/uL (ref 0.1–1.0)
Monocytes Relative: 10 %
Neutro Abs: 0.9 10*3/uL — ABNORMAL LOW (ref 1.7–7.7)
Neutrophils Relative %: 63 %
Platelets: 43 10*3/uL — ABNORMAL LOW (ref 150–400)
RBC: 4.64 MIL/uL (ref 3.87–5.11)
RDW: 26.7 % — ABNORMAL HIGH (ref 11.5–15.5)
WBC: 1.5 10*3/uL — ABNORMAL LOW (ref 4.0–10.5)
nRBC: 3.4 % — ABNORMAL HIGH (ref 0.0–0.2)

## 2023-08-24 LAB — RESP PANEL BY RT-PCR (RSV, FLU A&B, COVID)  RVPGX2
Influenza A by PCR: NEGATIVE
Influenza B by PCR: NEGATIVE
Resp Syncytial Virus by PCR: NEGATIVE
SARS Coronavirus 2 by RT PCR: NEGATIVE

## 2023-08-24 LAB — COMPREHENSIVE METABOLIC PANEL
ALT: 183 U/L — ABNORMAL HIGH (ref 0–44)
AST: 534 U/L — ABNORMAL HIGH (ref 15–41)
Albumin: 2.9 g/dL — ABNORMAL LOW (ref 3.5–5.0)
Alkaline Phosphatase: 483 U/L — ABNORMAL HIGH (ref 38–126)
Anion gap: 12 (ref 5–15)
BUN: 41 mg/dL — ABNORMAL HIGH (ref 8–23)
CO2: 23 mmol/L (ref 22–32)
Calcium: 14.3 mg/dL (ref 8.9–10.3)
Chloride: 102 mmol/L (ref 98–111)
Creatinine, Ser: 1.6 mg/dL — ABNORMAL HIGH (ref 0.44–1.00)
GFR, Estimated: 34 mL/min — ABNORMAL LOW (ref >60)
Glucose, Bld: 94 mg/dL (ref 70–99)
Potassium: 3.5 mmol/L (ref 3.5–5.1)
Sodium: 137 mmol/L (ref 135–145)
Total Bilirubin: 11.1 mg/dL — ABNORMAL HIGH (ref <1.2)
Total Protein: 5.6 g/dL — ABNORMAL LOW (ref 6.5–8.1)

## 2023-08-24 LAB — BLOOD GAS, VENOUS
Acid-Base Excess: 0.3 mmol/L (ref 0.0–2.0)
Bicarbonate: 25.4 mmol/L (ref 20.0–28.0)
O2 Saturation: 51.5 %
Patient temperature: 37
pCO2, Ven: 42 mm[Hg] — ABNORMAL LOW (ref 44–60)
pH, Ven: 7.39 (ref 7.25–7.43)
pO2, Ven: 33 mm[Hg] (ref 32–45)

## 2023-08-24 LAB — HEPATITIS PANEL, ACUTE
HCV Ab: NONREACTIVE
Hep A IgM: NONREACTIVE
Hep B C IgM: NONREACTIVE
Hepatitis B Surface Ag: NONREACTIVE

## 2023-08-24 LAB — MRSA NEXT GEN BY PCR, NASAL: MRSA by PCR Next Gen: NOT DETECTED

## 2023-08-24 LAB — ETHANOL: Alcohol, Ethyl (B): <10 mg/dL (ref <10)

## 2023-08-24 LAB — PROTIME-INR
INR: 1.1 (ref 0.8–1.2)
Prothrombin Time: 14.6 s (ref 11.4–15.2)

## 2023-08-24 LAB — AMMONIA: Ammonia: 37 umol/L — ABNORMAL HIGH (ref 9–35)

## 2023-08-24 LAB — LIPASE, BLOOD: Lipase: 640 U/L — ABNORMAL HIGH (ref 11–51)

## 2023-08-24 MED ORDER — IOHEXOL 350 MG/ML SOLN
80.0000 mL | Freq: Once | INTRAVENOUS | Status: AC | PRN
  Administered 2023-08-24: 80 mL via INTRAVENOUS

## 2023-08-24 MED ORDER — ONDANSETRON HCL 4 MG/2ML IJ SOLN
4.0000 mg | Freq: Four times a day (QID) | INTRAMUSCULAR | Status: DC | PRN
Start: 2023-08-24 — End: 2023-09-10
  Administered 2023-09-08: 4 mg via INTRAVENOUS
  Filled 2023-08-24: qty 2

## 2023-08-24 MED ORDER — PANTOPRAZOLE SODIUM 40 MG IV SOLR
40.0000 mg | Freq: Every day | INTRAVENOUS | Status: DC
  Administered 2023-08-24 – 2023-08-29 (×6): 40 mg via INTRAVENOUS
  Filled 2023-08-24 (×6): qty 10

## 2023-08-24 MED ORDER — HYDROMORPHONE HCL 1 MG/ML IJ SOLN: 0.5000 mg | INTRAMUSCULAR | Status: DC | PRN

## 2023-08-24 MED ORDER — HYDROMORPHONE HCL 1 MG/ML IJ SOLN
0.2000 mg | INTRAMUSCULAR | Status: DC | PRN
  Administered 2023-08-25 – 2023-09-01 (×14): 0.5 mg via INTRAVENOUS
  Filled 2023-08-24 (×14): qty 0.5

## 2023-08-24 MED ORDER — OXYCODONE HCL 5 MG PO TABS
5.0000 mg | ORAL_TABLET | ORAL | Status: DC | PRN
Start: 2023-08-24 — End: 2023-08-24

## 2023-08-24 MED ORDER — SODIUM CHLORIDE 0.9 % IV BOLUS
500.0000 mL | Freq: Once | INTRAVENOUS | Status: AC
  Administered 2023-08-24: 500 mL via INTRAVENOUS

## 2023-08-24 MED ORDER — OXYCODONE HCL 5 MG PO TABS
2.5000 mg | ORAL_TABLET | ORAL | Status: DC | PRN
  Administered 2023-09-03: 5 mg via ORAL
  Filled 2023-08-24 (×2): qty 1

## 2023-08-24 MED ORDER — SODIUM CHLORIDE 0.9 % IV SOLN: INTRAVENOUS | Status: DC

## 2023-08-24 MED ORDER — ALBUTEROL SULFATE (2.5 MG/3ML) 0.083% IN NEBU: 2.5000 mg | INHALATION_SOLUTION | RESPIRATORY_TRACT | Status: DC | PRN

## 2023-08-24 MED ORDER — SODIUM CHLORIDE 0.9 % IV SOLN: INTRAVENOUS | Status: AC

## 2023-08-24 MED ORDER — HYDRALAZINE HCL 20 MG/ML IJ SOLN: 5.0000 mg | Freq: Four times a day (QID) | INTRAMUSCULAR | Status: DC | PRN

## 2023-08-24 MED ORDER — ONDANSETRON HCL 4 MG PO TABS: 4.0000 mg | ORAL_TABLET | Freq: Four times a day (QID) | ORAL | Status: DC | PRN

## 2023-08-24 MED ORDER — TRAZODONE HCL 50 MG PO TABS
25.0000 mg | ORAL_TABLET | Freq: Every evening | ORAL | Status: DC | PRN
  Administered 2023-09-07: 25 mg via ORAL
  Filled 2023-08-24: qty 1

## 2023-08-24 NOTE — ED Notes (Signed)
Skin tear at left inner upper thigh.

## 2023-08-24 NOTE — ED Provider Notes (Signed)
Emergency Department Provider Note   I have reviewed the triage vital signs and the nursing notes.   HISTORY  Chief Complaint No chief complaint on file.   HPI Cynthia Velasquez is a 72 y.o. female with past history of lymphoma, undergoing active chemotherapy, presents to the emergency department with increased generalized weakness and some altered mental status.  Symptoms ongoing for an unknown period of time.  Apparently, patient lives at home with daughter but patient is altered and thus unable to provide significant history.  Level 5 caveat applies.   Past Medical History:  Diagnosis Date   Anal cancer (HCC) dx'd 05/2019   Anxiety    Arthritis    Bilateral breast cancer (HCC) 10/21/2013   Breast cancer (HCC)    Cancer (HCC)    Depression    Gallstones    GERD (gastroesophageal reflux disease)    doesn't take any meds for this   Headache    h/o migraines       History of bladder infections    History of hiatal hernia    History of migraine    Hyperlipidemia    takes Simvastatin daily   Hypertension    takes Hyzaar   Joint pain    Joint swelling    Lumbar stenosis    Lymphoma (HCC)    NHL   Neuropathy 09/07/2013   Pneumonia    Pre-diabetes     Review of Systems  Level 5 caveat: AMS  ____________________________________________   PHYSICAL EXAM:  VITAL SIGNS: ED Triage Vitals [08/24/23 0732]  Encounter Vitals Group     BP 132/83     Pulse Rate 89     Resp 13     Temp 97.6 F (36.4 C)     Temp Source Axillary     SpO2 (!) 88 %   Constitutional: Drowsy but awakens to voice. Follows commands slowly.  Eyes: Conjunctivae are normal.  Sclera are icteric. Head: Atraumatic. Nose: No congestion/rhinnorhea. Mouth/Throat: Mucous membranes are dry.  Neck: No stridor.   Cardiovascular: Normal rate, regular rhythm. Good peripheral circulation. Grossly normal heart sounds.   Respiratory: Normal respiratory effort.  No retractions. Lungs  CTAB. Gastrointestinal: Soft and nontender. No distention.  Musculoskeletal:  No gross deformities of extremities. Neurologic:  Normal speech and language. Slow to respond. Diffusely reduced  Skin:  Skin is warm, dry and intact. No rash noted.   ____________________________________________   LABS (all labs ordered are listed, but only abnormal results are displayed)  Labs Reviewed  RESP PANEL BY RT-PCR (RSV, FLU A&B, COVID)  [id number]  COMPREHENSIVE METABOLIC PANEL  LIPASE, BLOOD  CBC WITH DIFFERENTIAL/PLATELET  PROTIME-INR  AMMONIA  URINALYSIS, ROUTINE W REFLEX MICROSCOPIC  RAPID URINE DRUG SCREEN, HOSP PERFORMED  ETHANOL  BLOOD GAS, VENOUS   ____________________________________________  EKG  *** ____________________________________________  RADIOLOGY  No results found.  ____________________________________________   PROCEDURES  Procedure(s) performed:   Procedures   ____________________________________________   INITIAL IMPRESSION / ASSESSMENT AND PLAN / ED COURSE  Pertinent labs & imaging results that were available during my care of the patient were reviewed by me and considered in my medical decision making (see chart for details).   This patient is Presenting for Evaluation of AMS, which does require a range of treatment options, and is a complaint that involves a high risk of morbidity and mortality.  The Differential Diagnoses includes but is not exclusive to alcohol, illicit or prescription medications, intracranial pathology such as stroke, intracerebral hemorrhage, fever or  infectious causes including sepsis, hypoxemia, uremia, trauma, endocrine related disorders such as diabetes, hypoglycemia, thyroid-related diseases, etc.   Critical Interventions-    Medications  sodium chloride 0.9 % bolus 500 mL (500 mLs Intravenous New Bag/Given 08/24/23 0847)    Reassessment after intervention:     I did obtain Additional Historical Information from EMS.    I decided to review pertinent External Data, and in summary PET scan performed recently. Read pending.   Clinical Laboratory Tests Ordered, included   Radiologic Tests Ordered, included ***. I independently interpreted the images and agree with radiology interpretation.   Cardiac Monitor Tracing which shows NSR.    Social Determinants of Health Risk ***  Consult complete with  Medical Decision Making: Summary:  Patient presents with increased weakness, confusion.  She appears jaundiced and thin.  Afebrile.  No SIRS vitals.  Doubt sepsis but plan for screening lab work along with likely imaging of the chest/pelvis.  Reevaluation with update and discussion with   ***Considered admission***  Patient's presentation is most consistent with acute presentation with potential threat to life or bodily function.   Disposition:   ____________________________________________  FINAL CLINICAL IMPRESSION(S) / ED DIAGNOSES  Final diagnoses:  None     NEW OUTPATIENT MEDICATIONS STARTED DURING THIS VISIT:  New Prescriptions   No medications on file    Note:  This document was prepared using Dragon voice recognition software and may include unintentional dictation errors.  Alona Bene, MD, [facility] Emergency Medicine

## 2023-08-24 NOTE — ED Notes (Signed)
ED TO INPATIENT HANDOFF REPORT  ED Nurse Name and Phone #: Marygrace Drought RN  S Name/Age/Gender Cynthia Velasquez 72 y.o. female Room/Bed: WA05/WA05  Code Status   Code Status: Full Code  Home/SNF/Other Home Patient oriented to: self Is this baseline? No   Triage Complete: Triage complete  Chief Complaint Encephalopathy [G93.40]  Triage Note Per EMS from home. Increasing weakness since Thursday. Hx of cancer. Not eating or drinking. Lives at home with daughter. Non compliant with meds. Failure to thrive.  BP 128/82 HR 80 RR 13 CBG 109   Allergies Allergies  Allergen Reactions   Tramadol Itching and Rash   Belinostat Nausea And Vomiting    Headache and  nausea during infusion. See progress note from 02/24/23   Codeine Itching and Rash   Vicodin [Hydrocodone-Acetaminophen] Itching and Rash    Level of Care/Admitting Diagnosis ED Disposition     ED Disposition  Admit   Condition  --   Comment  Hospital Area: [facility] [[id number]]  Level of Care: Med-Surg [16]  May place patient in observation at [facility] or [facility] if equivalent level of care is available:: No  Covid Evaluation: Asymptomatic - no recent exposure (last 10 days) testing not required  Diagnosis: Encephalopathy [291958]  Admitting Physician: Maryln Gottron [[id number]]  Attending Physician: Kirby Crigler, MIR M [[id number]]          B Medical/Surgery History Past Medical History:  Diagnosis Date   Anal cancer (HCC) dx'd 05/2019   Anxiety    Arthritis    Bilateral breast cancer (HCC) 10/21/2013   Breast cancer (HCC)    Cancer (HCC)    Depression    Gallstones    GERD (gastroesophageal reflux disease)    doesn't take any meds for this   Headache    h/o migraines       History of bladder infections    History of hiatal hernia    History of migraine    Hyperlipidemia    takes Simvastatin daily   Hypertension    takes Hyzaar   Joint pain    Joint swelling    Lumbar  stenosis    Lymphoma (HCC)    NHL   Neuropathy 09/07/2013   Pneumonia    Pre-diabetes    Past Surgical History:  Procedure Laterality Date   ABDOMINAL HYSTERECTOMY     bone spur removed from left foot     CHOLECYSTECTOMY     COLONOSCOPY     COLONOSCOPY WITH PROPOFOL Bilateral 12/07/2021   Procedure: COLONOSCOPY WITH PROPOFOL;  Surgeon: Vida Rigger, MD;  Location: [facility];  Service: Endoscopy;  Laterality: Bilateral;   double mastectomy      ESOPHAGOGASTRODUODENOSCOPY N/A 12/07/2021   Procedure: ESOPHAGOGASTRODUODENOSCOPY (EGD);  Surgeon: Vida Rigger, MD;  Location: [facility];  Service: Endoscopy;  Laterality: N/A;   HERNIA REPAIR     umbilical   IR IMAGING GUIDED PORT INSERTION  06/25/2022   right knee arthroscopy     right shoulder arthroscopy     surgery for hiatal hernia     TONSILLECTOMY     TOTAL HIP ARTHROPLASTY Left 02/12/2013   TOTAL HIP ARTHROPLASTY Left 02/12/2013   Procedure: LEFT TOTAL HIP ARTHROPLASTY;  Surgeon: Nestor Lewandowsky, MD;  Location: [facility] OR;  Service: Orthopedics;  Laterality: Left;   TOTAL HIP ARTHROPLASTY Right 05/03/2013   Procedure: TOTAL HIP ARTHROPLASTY;  Surgeon: Nestor Lewandowsky, MD;  Location: [facility] OR;  Service: Orthopedics;  Laterality: Right;   TOTAL KNEE  ARTHROPLASTY Right 02/27/2015   Procedure: TOTAL KNEE ARTHROPLASTY;  Surgeon: Gean Birchwood, MD;  Location: [facility] OR;  Service: Orthopedics;  Laterality: Right;   TOTAL SHOULDER ARTHROPLASTY Right 09/12/2016   Procedure: RIGHT TOTAL SHOULDER ARTHROPLASTY;  Surgeon: Jones Broom, MD;  Location: [facility] OR;  Service: Orthopedics;  Laterality: Right;  RIGHT TOTAL SHOULDER ARTHROPLASTY     A IV Location/Drains/Wounds Patient Lines/Drains/Airways Status     Active Line/Drains/Airways     Name Placement date Placement time Site Days   Implanted Port 06/25/22 Right Chest 06/25/22  --  Chest  425   Peripheral IV 08/24/23 20 G 1" Left Antecubital 08/24/23  0823  Antecubital  less than 1             Intake/Output Last 24 hours No intake or output data in the 24 hours ending 08/24/23 1206  Labs/Imaging Results for orders placed or performed during the hospital encounter of 08/24/23 (from the past 48 hour(s))  Comprehensive metabolic panel     Status: Abnormal   Collection Time: 08/24/23  8:35 AM  Result Value Ref Range   Sodium 137 135 - 145 mmol/L   Potassium 3.5 3.5 - 5.1 mmol/L   Chloride 102 98 - 111 mmol/L   CO2 23 22 - 32 mmol/L   Glucose, Bld 94 70 - 99 mg/dL    Comment: Glucose reference range applies only to samples taken after fasting for at least 8 hours.   BUN 41 (H) 8 - 23 mg/dL   Creatinine, Ser 7.84 (H) 0.44 - 1.00 mg/dL   Calcium 69.6 (HH) 8.9 - 10.3 mg/dL    Comment: CRITICAL RESULT CALLED TO, READ BACK BY AND VERIFIED WITH [place],D RN AT 1007 08/24/23 MULLINS,T    Total Protein 5.6 (L) 6.5 - 8.1 g/dL   Albumin 2.9 (L) 3.5 - 5.0 g/dL   AST 295 (H) 15 - 41 U/L   ALT 183 (H) 0 - 44 U/L   Alkaline Phosphatase 483 (H) 38 - 126 U/L   Total Bilirubin 11.1 (H) <1.2 mg/dL   GFR, Estimated 34 (L) >60 mL/min    Comment: (NOTE) Calculated using the CKD-EPI Creatinine Equation (2021)    Anion gap 12 5 - 15    Comment: Performed at [facility], [address] [place], [place] [address]  Lipase, blood     Status: Abnormal   Collection Time: 08/24/23  8:35 AM  Result Value Ref Range   Lipase 640 (H) 11 - 51 U/L    Comment: RESULTS CONFIRMED BY MANUAL DILUTION Performed at [facility], [address], [place], [place] [address]   CBC with Differential     Status: Abnormal   Collection Time: 08/24/23  8:35 AM  Result Value Ref Range   WBC 1.5 (L) 4.0 - 10.5 K/uL   RBC 4.64 3.87 - 5.11 MIL/uL   Hemoglobin 13.2 12.0 - 15.0 g/dL   HCT 02.7 25.3 - 66.4 %   MCV 88.6 80.0 - 100.0 fL   MCH 28.4 26.0 - 34.0 pg   MCHC 32.1 30.0 - 36.0 g/dL   RDW 40.3 (H) 47.4 - 25.9 %   Platelets 43 (L) 150 - 400 K/uL    Comment:  Immature Platelet Fraction may be clinically indicated, consider ordering this additional test [id number]    nRBC 3.4 (H) 0.0 - 0.2 %   Neutrophils Relative % 63 %   Neutro Abs 0.9 (L) 1.7 - 7.7 K/uL   Lymphocytes Relative 20 %  Lymphs Abs 0.3 (L) 0.7 - 4.0 K/uL   Monocytes Relative 10 %   Monocytes Absolute 0.1 0.1 - 1.0 K/uL   Eosinophils Relative 0 %   Eosinophils Absolute 0.0 0.0 - 0.5 K/uL   Basophils Relative 2 %   Basophils Absolute 0.0 0.0 - 0.1 K/uL   Immature Granulocytes 5 %   Abs Immature Granulocytes 0.07 0.00 - 0.07 K/uL   Burr Cells PRESENT    Polychromasia PRESENT    Ovalocytes PRESENT     Comment: Performed at [facility], [address] [place], [place] [address]  Protime-INR     Status: None   Collection Time: 08/24/23  8:35 AM  Result Value Ref Range   Prothrombin Time 14.6 11.4 - 15.2 seconds   INR 1.1 0.8 - 1.2    Comment: (NOTE) INR goal varies based on device and disease states. Performed at [facility], [address], [place], [place] [address]   Ammonia     Status: Abnormal   Collection Time: 08/24/23  8:35 AM  Result Value Ref Range   Ammonia 37 (H) 9 - 35 umol/L    Comment: Performed at [facility] [address], [place], [place] [address]  Ethanol     Status: None   Collection Time: 08/24/23  8:35 AM  Result Value Ref Range   Alcohol, Ethyl (B) <10 <10 mg/dL    Comment: (NOTE) Lowest detectable limit for serum alcohol is 10 mg/dL.  For medical purposes only. Performed at [facility], [address] [place], [place] [address]   Resp panel by RT-PCR (RSV, Flu A&B, Covid) Anterior Nasal Swab     Status: None   Collection Time: 08/24/23  8:37 AM   Specimen: Anterior Nasal Swab  Result Value Ref Range   SARS Coronavirus 2 by RT PCR NEGATIVE NEGATIVE    Comment: (NOTE) SARS-CoV-2 target nucleic acids are NOT DETECTED.  The SARS-CoV-2 RNA is generally  detectable in upper respiratory specimens during the acute phase of infection. The lowest concentration of SARS-CoV-2 viral copies this assay can detect is 138 copies/mL. A negative result does not preclude SARS-Cov-2 infection and should not be used as the sole basis for treatment or other patient management decisions. A negative result may occur with  improper specimen collection/handling, submission of specimen other than nasopharyngeal swab, presence of viral mutation(s) within the areas targeted by this assay, and inadequate number of viral copies(<138 copies/mL). A negative result must be combined with clinical observations, patient history, and epidemiological information. The expected result is Negative.  Fact Sheet for Patients:  BloggerCourse.com  Fact Sheet for Healthcare Providers:  SeriousBroker.it  This test is no t yet approved or cleared by the [place] FDA and  has been authorized for detection and/or diagnosis of SARS-CoV-2 by FDA under an Emergency Use Authorization (EUA). This EUA will remain  in effect (meaning this test can be used) for the duration of the COVID-19 declaration under Section 564(b)(1) of the Act, 21 U.S.C.section 360bbb-3(b)(1), unless the authorization is terminated  or revoked sooner.       Influenza A by PCR NEGATIVE NEGATIVE   Influenza B by PCR NEGATIVE NEGATIVE    Comment: (NOTE) The Xpert Xpress SARS-CoV-2/FLU/RSV plus assay is intended as an aid in the diagnosis of influenza from Nasopharyngeal swab specimens and should not be used as a sole basis for treatment. Nasal washings and aspirates are unacceptable for Xpert Xpress SARS-CoV-2/FLU/RSV testing.  Fact Sheet for Patients: BloggerCourse.com  Fact Sheet for Healthcare Providers: SeriousBroker.it  This test is not yet approved or cleared by the [place] FDA and has  been authorized for detection and/or diagnosis of SARS-CoV-2 by FDA under an Emergency Use Authorization (EUA). This EUA will remain in effect (meaning this test can be used) for the duration of the COVID-19 declaration under Section 564(b)(1) of the Act, 21 U.S.C. section 360bbb-3(b)(1), unless the authorization is terminated or revoked.     Resp Syncytial Virus by PCR NEGATIVE NEGATIVE    Comment: (NOTE) Fact Sheet for Patients: BloggerCourse.com  Fact Sheet for Healthcare Providers: SeriousBroker.it  This test is not yet approved or cleared by the [place] FDA and has been authorized for detection and/or diagnosis of SARS-CoV-2 by FDA under an Emergency Use Authorization (EUA). This EUA will remain in effect (meaning this test can be used) for the duration of the COVID-19 declaration under Section 564(b)(1) of the Act, 21 U.S.C. section 360bbb-3(b)(1), unless the authorization is terminated or revoked.  Performed at [facility], [address] [place], [place] [address]   Blood gas, venous (at [facility] and [facility])     Status: Abnormal   Collection Time: 08/24/23  8:40 AM  Result Value Ref Range   pH, Ven 7.39 7.25 - 7.43   pCO2, Ven 42 (L) 44 - 60 mmHg   pO2, Ven 33 32 - 45 mmHg   Bicarbonate 25.4 20.0 - 28.0 mmol/L   Acid-Base Excess 0.3 0.0 - 2.0 mmol/L   O2 Saturation 51.5 %   Patient temperature 37.0     Comment: Performed at [facility], [address] [place], [place] [address]   CT Angio Chest PE W and/or Wo Contrast  Result Date: 08/24/2023 CLINICAL DATA:  Increasing weakness since Thursday, history of lymphoma EXAM: CT ANGIOGRAPHY CHEST CT ABDOMEN AND PELVIS WITH CONTRAST TECHNIQUE: Multidetector CT imaging of the chest was performed using the standard protocol during bolus administration of intravenous contrast. Multiplanar CT image reconstructions and MIPs were obtained to  evaluate the vascular anatomy. Multidetector CT imaging of the abdomen and pelvis was performed using the standard protocol during bolus administration of intravenous contrast. RADIATION DOSE REDUCTION: This exam was performed according to the departmental dose-optimization program which includes automated exposure control, adjustment of the mA and/or kV according to patient size and/or use of iterative reconstruction technique. CONTRAST:  80mL OMNIPAQUE IOHEXOL 350 MG/ML SOLN COMPARISON:  08/07/2023, 07/31/2023, 05/09/2023 FINDINGS: CTA CHEST FINDINGS Cardiovascular: This is a technically adequate evaluation of the pulmonary vasculature. No filling defects or pulmonary emboli. The heart is unremarkable without pericardial effusion. Ectasia of the thoracic aorta without evidence of aneurysm or dissection. Atherosclerosis of the aortic arch. Mediastinum/Nodes: Extensive lymphadenopathy is identified, similar in appearance to recent PET scan. Index lymph nodes are as follows: Right cervical chain, 2.9 cm, image 4/4. Left axilla, 1.2 cm short axis, image 50/4 AP window, 1.3 cm short axis, image 54/4. Left hilum, 1.6 cm short axis, image 42/4. Left internal mammary chain, 1.2 cm short axis, image 55/4. The lymphadenopathy has progressed since the 12/28/2022 chest CT, but is similar in appearance to recent PET scan 08/07/2023. Thyroid, trachea, and esophagus are unremarkable. Lungs/Pleura: There are 2 new left upper lobe pulmonary nodules. On image 20/12 there is an 8 mm nodule, which is metabolically active on recent PET scan. On image 55/12 there is a 7 mm nodule, with faint low level activity on recent PET scan. Findings  are suspicious for metastatic disease. Hypoventilatory changes are seen at the lung bases. Curvilinear areas of subpleural consolidation at the lung apices may reflect atelectasis. No effusion or pneumothorax. Musculoskeletal: 9 mm subcutaneous nodule in the posterior left shoulder image 43/4  corresponds to a metabolically active lesion on PET scan, and is new since prior CT. No acute or destructive bony abnormalities. Severe degenerative changes of the left shoulder. Unremarkable right shoulder arthroplasty. Reconstructed images demonstrate no additional findings. Review of the MIP images confirms the above findings. CT ABDOMEN and PELVIS FINDINGS Hepatobiliary: No focal liver abnormality is seen. Status post cholecystectomy. No biliary dilatation. Pancreas: Unremarkable. No pancreatic ductal dilatation or surrounding inflammatory changes. Spleen: Normal in size without focal abnormality. Adrenals/Urinary Tract: Stable appearance of the adrenals and kidneys. No urinary tract calculi or obstruction. Bladder is unremarkable. Stomach/Bowel: No bowel obstruction or ileus. Scattered colonic diverticulosis without diverticulitis. No bowel wall thickening or inflammatory change. Vascular/Lymphatic: Persistent lymphadenopathy throughout the abdomen and pelvis. Index lymph nodes are as follows: Right retrocrural, image 24/2, 12 mm.  Previously 16 mm. Porta hepatis, image 33/2, 13 mm.  No change. Retroperitoneal lymph node mass, image 50/2, 78 x 30 mm, previously 89 x 31 mm. Central mesenteric, image 59/2, 9 mm.  Previously 14 mm. Right inguinal, image 90/2, 11 mm.  Previously 15 mm. Stable aortic atherosclerosis. There is extrinsic compression upon the IVC and bilateral common iliac veins due to the retroperitoneal lymph node mass, stable since prior study. Reproductive: Status post hysterectomy. No adnexal masses. Other: Trace free fluid within the paracolic gutters. No free intraperitoneal gas. Small fat containing supraumbilical ventral hernias are stable. Musculoskeletal: No acute or destructive bony abnormalities. Reconstructed images demonstrate no additional findings. Review of the MIP images confirms the above findings. IMPRESSION: Chest: 1. No evidence of pulmonary embolus. 2. Extensive lymphadenopathy  within the neck, axillary regions, mediastinum, and hila. Similar appearance to recent PET scan 08/07/2023, with progression since prior chest CT 12/28/2022. 3. New left upper lobe pulmonary nodule since the prior chest CT, corresponding to metabolically active lesions on recent PET scan, consistent with metastatic disease. 4.  Aortic Atherosclerosis (ICD10-I70.0). Abdomen/pelvis: 1. Diffuse lymphadenopathy throughout the abdomen and pelvis, minimally decreased in size since the abdominal CT 07/31/2023. No new adenopathy is identified. 2. Trace free fluid within the bilateral paracolic gutters, nonspecific. 3. No other acute intra-abdominal or intrapelvic process. Electronically Signed   By: Sharlet Salina M.D.   On: 08/24/2023 11:06   CT ABDOMEN PELVIS W CONTRAST  Result Date: 08/24/2023 CLINICAL DATA:  Increasing weakness since Thursday, history of lymphoma EXAM: CT ANGIOGRAPHY CHEST CT ABDOMEN AND PELVIS WITH CONTRAST TECHNIQUE: Multidetector CT imaging of the chest was performed using the standard protocol during bolus administration of intravenous contrast. Multiplanar CT image reconstructions and MIPs were obtained to evaluate the vascular anatomy. Multidetector CT imaging of the abdomen and pelvis was performed using the standard protocol during bolus administration of intravenous contrast. RADIATION DOSE REDUCTION: This exam was performed according to the departmental dose-optimization program which includes automated exposure control, adjustment of the mA and/or kV according to patient size and/or use of iterative reconstruction technique. CONTRAST:  80mL OMNIPAQUE IOHEXOL 350 MG/ML SOLN COMPARISON:  08/07/2023, 07/31/2023, 05/09/2023 FINDINGS: CTA CHEST FINDINGS Cardiovascular: This is a technically adequate evaluation of the pulmonary vasculature. No filling defects or pulmonary emboli. The heart is unremarkable without pericardial effusion. Ectasia of the thoracic aorta without evidence of aneurysm  or dissection. Atherosclerosis of the aortic arch. Mediastinum/Nodes: Extensive lymphadenopathy  is identified, similar in appearance to recent PET scan. Index lymph nodes are as follows: Right cervical chain, 2.9 cm, image 4/4. Left axilla, 1.2 cm short axis, image 50/4 AP window, 1.3 cm short axis, image 54/4. Left hilum, 1.6 cm short axis, image 42/4. Left internal mammary chain, 1.2 cm short axis, image 55/4. The lymphadenopathy has progressed since the 12/28/2022 chest CT, but is similar in appearance to recent PET scan 08/07/2023. Thyroid, trachea, and esophagus are unremarkable. Lungs/Pleura: There are 2 new left upper lobe pulmonary nodules. On image 20/12 there is an 8 mm nodule, which is metabolically active on recent PET scan. On image 55/12 there is a 7 mm nodule, with faint low level activity on recent PET scan. Findings are suspicious for metastatic disease. Hypoventilatory changes are seen at the lung bases. Curvilinear areas of subpleural consolidation at the lung apices may reflect atelectasis. No effusion or pneumothorax. Musculoskeletal: 9 mm subcutaneous nodule in the posterior left shoulder image 43/4 corresponds to a metabolically active lesion on PET scan, and is new since prior CT. No acute or destructive bony abnormalities. Severe degenerative changes of the left shoulder. Unremarkable right shoulder arthroplasty. Reconstructed images demonstrate no additional findings. Review of the MIP images confirms the above findings. CT ABDOMEN and PELVIS FINDINGS Hepatobiliary: No focal liver abnormality is seen. Status post cholecystectomy. No biliary dilatation. Pancreas: Unremarkable. No pancreatic ductal dilatation or surrounding inflammatory changes. Spleen: Normal in size without focal abnormality. Adrenals/Urinary Tract: Stable appearance of the adrenals and kidneys. No urinary tract calculi or obstruction. Bladder is unremarkable. Stomach/Bowel: No bowel obstruction or ileus. Scattered colonic  diverticulosis without diverticulitis. No bowel wall thickening or inflammatory change. Vascular/Lymphatic: Persistent lymphadenopathy throughout the abdomen and pelvis. Index lymph nodes are as follows: Right retrocrural, image 24/2, 12 mm.  Previously 16 mm. Porta hepatis, image 33/2, 13 mm.  No change. Retroperitoneal lymph node mass, image 50/2, 78 x 30 mm, previously 89 x 31 mm. Central mesenteric, image 59/2, 9 mm.  Previously 14 mm. Right inguinal, image 90/2, 11 mm.  Previously 15 mm. Stable aortic atherosclerosis. There is extrinsic compression upon the IVC and bilateral common iliac veins due to the retroperitoneal lymph node mass, stable since prior study. Reproductive: Status post hysterectomy. No adnexal masses. Other: Trace free fluid within the paracolic gutters. No free intraperitoneal gas. Small fat containing supraumbilical ventral hernias are stable. Musculoskeletal: No acute or destructive bony abnormalities. Reconstructed images demonstrate no additional findings. Review of the MIP images confirms the above findings. IMPRESSION: Chest: 1. No evidence of pulmonary embolus. 2. Extensive lymphadenopathy within the neck, axillary regions, mediastinum, and hila. Similar appearance to recent PET scan 08/07/2023, with progression since prior chest CT 12/28/2022. 3. New left upper lobe pulmonary nodule since the prior chest CT, corresponding to metabolically active lesions on recent PET scan, consistent with metastatic disease. 4.  Aortic Atherosclerosis (ICD10-I70.0). Abdomen/pelvis: 1. Diffuse lymphadenopathy throughout the abdomen and pelvis, minimally decreased in size since the abdominal CT 07/31/2023. No new adenopathy is identified. 2. Trace free fluid within the bilateral paracolic gutters, nonspecific. 3. No other acute intra-abdominal or intrapelvic process. Electronically Signed   By: Sharlet Salina M.D.   On: 08/24/2023 11:06   DG Chest Portable 1 View  Result Date: 08/24/2023 CLINICAL  DATA:  73 year old female with weakness.  Lymphoma. EXAM: PORTABLE CHEST 1 VIEW COMPARISON:  Portable chest 03/28/2023.  PET-CT 08/07/2023. FINDINGS: Portable AP semi upright view at 0847 hours. Right chest power port remains. Increased mediastinal contour compatible  with mediastinal lymphadenopathy in this setting. Visualized tracheal air column is within normal limits. Low lung volumes. But Allowing for portable technique the lungs are clear. No pneumothorax or pleural effusion. Paucity bowel gas. No acute osseous abnormality identified. Chronic chest wall surgical clips. Chronic right shoulder arthroplasty. IMPRESSION: 1. Evidence of progressed mediastinal lymphadenopathy since July. 2. Low lung volumes, otherwise no acute cardiopulmonary abnormality. Electronically Signed   By: Odessa Fleming M.D.   On: 08/24/2023 09:00   CT Head Wo Contrast  Result Date: 08/24/2023 CLINICAL DATA:  72 year old female with altered mental status. Increasing weakness. Lymphoma. EXAM: CT HEAD WITHOUT CONTRAST TECHNIQUE: Contiguous axial images were obtained from the base of the skull through the vertex without intravenous contrast. RADIATION DOSE REDUCTION: This exam was performed according to the departmental dose-optimization program which includes automated exposure control, adjustment of the mA and/or kV according to patient size and/or use of iterative reconstruction technique. COMPARISON:  Brain MRI 10/27/2021.  Head CT 03/28/2023. FINDINGS: Brain: Cerebral volume remains normal for age. No midline shift, ventriculomegaly, mass effect, evidence of mass lesion, intracranial hemorrhage or evidence of cortically based acute infarction. Gray-white differentiation stable and within normal limits for age. Vascular: No suspicious intracranial vascular hyperdensity. Skull: Bilateral TMJ degeneration. No acute or suspicious osseous lesion. Sinuses/Orbits: Visualized paranasal sinuses and mastoids are stable and well aerated. Other:  Visualized orbits and scalp soft tissues are within normal limits. IMPRESSION: Stable and normal for age noncontrast CT appearance of the Brain. Electronically Signed   By: Odessa Fleming M.D.   On: 08/24/2023 08:56    Pending Labs Unresulted Labs (From admission, onward)     Start     Ordered   08/25/23 0500  Comprehensive metabolic panel  Tomorrow morning,   R        08/24/23 1205   08/25/23 0500  CBC  Tomorrow morning,   R        08/24/23 1205   08/24/23 0801  Urinalysis, Routine w reflex microscopic -Urine, Clean Catch  Once,   URGENT       Question:  Specimen Source  Answer:  Urine, Clean Catch   08/24/23 0801   08/24/23 0801  Urine rapid drug screen (hosp performed)  Once,   STAT        08/24/23 0801            Vitals/Pain Today's Vitals   08/24/23 0900 08/24/23 0928 08/24/23 0930 08/24/23 1000  BP: (!) 140/94  (!) 139/95 129/85  Pulse: 71  70 73  Resp:  13 13 15   Temp:      TempSrc:      SpO2: 100%  97% 96%    Isolation Precautions No active isolations  Medications Medications  sodium chloride 0.9 % bolus 500 mL (has no administration in time range)  pantoprazole (PROTONIX) injection 40 mg (has no administration in time range)  oxyCODONE (Oxy IR/ROXICODONE) immediate release tablet 5 mg (has no administration in time range)  HYDROmorphone (DILAUDID) injection 0.5 mg (has no administration in time range)  ondansetron (ZOFRAN) tablet 4 mg (has no administration in time range)    Or  ondansetron (ZOFRAN) injection 4 mg (has no administration in time range)  traZODone (DESYREL) tablet 25 mg (has no administration in time range)  albuterol (PROVENTIL) (2.5 MG/3ML) 0.083% nebulizer solution 2.5 mg (has no administration in time range)  0.9 %  sodium chloride infusion (has no administration in time range)  sodium chloride 0.9 % bolus 500 mL (500  mLs Intravenous New Bag/Given 08/24/23 0847)  iohexol (OMNIPAQUE) 350 MG/ML injection 80 mL (80 mLs Intravenous Contrast Given  08/24/23 1024)    Mobility walks     Focused Assessments Cardiac Assessment Handoff:    No results found for: "CKTOTAL", "CKMB", "CKMBINDEX", "TROPONINI" No results found for: "DDIMER" Does the Patient currently have chest pain? No    R Recommendations: See Admitting Provider Note  Report given to:   Additional Notes:  Pt normally ambulates with walker at home

## 2023-08-24 NOTE — H&P (Signed)
History and Physical  Cynthia Velasquez OZH:[id number] DOB: 07/01/1951 DOA: 08/24/2023  PCP: [name], MD   Chief Complaint: Lethargy, confusion  HPI: Cynthia Velasquez is a 72 y.o. female with medical history significant for breast cancer, anal cancer, most recent diagnosis of non-Hodgkin lymphoma on chemotherapy, essential hypertension and GERD being admitted to the hospital with failure to thrive and concern for worsening lymphoma.  History is provided by the patient's daughter who is at the bedside, and with whom the patient lives.  Patient received chemotherapy 08/11/2023 of Bendamustine under the care of Dr. Candise Che after consultation as well with Dr. Alphonsa Gin at [facility] who also concurred with this recommended treatment.  Patient's daughter who is at the bedside states that she was doing well after her chemotherapy at the end of November, seemed to have good energy, was still driving herself and even going to work.  However over the last 5 days, she has had progressive weakness, has not been eating or drinking very much, has had worsening abdominal pain, and difficulty ambulating.  This morning, she was unable to get out of bed on her own, so daughter decided to call EMS.  In the emergency department, her vital signs been stable, she has been somewhat somnolent, though pleasant and cooperative.  Lab work as detailed below shows evidence of new leukopenia, thrombocytopenia, worsening liver failure, jaundice and acute renal failure.  CT scans as detailed below thankfully showed no acute intracranial findings, however she does have continued diffuse lymphadenopathy.  ER provider discussed with gastroenterology, who recommends MRCP, and this has been ordered.  They also discussed with medical oncology who will consult, and hospitalist has accepted for admission.  Review of Systems: Please see HPI for pertinent positives and negatives. A complete 10 system review of systems are otherwise negative.  Past  Medical History:  Diagnosis Date   Anal cancer (HCC) dx'd 05/2019   Anxiety    Arthritis    Bilateral breast cancer (HCC) 10/21/2013   Breast cancer (HCC)    Cancer (HCC)    Depression    Gallstones    GERD (gastroesophageal reflux disease)    doesn't take any meds for this   Headache    h/o migraines       History of bladder infections    History of hiatal hernia    History of migraine    Hyperlipidemia    takes Simvastatin daily   Hypertension    takes Hyzaar   Joint pain    Joint swelling    Lumbar stenosis    Lymphoma (HCC)    NHL   Neuropathy 09/07/2013   Pneumonia    Pre-diabetes    Past Surgical History:  Procedure Laterality Date   ABDOMINAL HYSTERECTOMY     bone spur removed from left foot     CHOLECYSTECTOMY     COLONOSCOPY     COLONOSCOPY WITH PROPOFOL Bilateral 12/07/2021   Procedure: COLONOSCOPY WITH PROPOFOL;  Surgeon: Vida Rigger, MD;  Location: [facility];  Service: Endoscopy;  Laterality: Bilateral;   double mastectomy      ESOPHAGOGASTRODUODENOSCOPY N/A 12/07/2021   Procedure: ESOPHAGOGASTRODUODENOSCOPY (EGD);  Surgeon: Vida Rigger, MD;  Location: [facility];  Service: Endoscopy;  Laterality: N/A;   HERNIA REPAIR     umbilical   IR IMAGING GUIDED PORT INSERTION  06/25/2022   right knee arthroscopy     right shoulder arthroscopy     surgery for hiatal hernia     TONSILLECTOMY     TOTAL  HIP ARTHROPLASTY Left 02/12/2013   TOTAL HIP ARTHROPLASTY Left 02/12/2013   Procedure: LEFT TOTAL HIP ARTHROPLASTY;  Surgeon: Nestor Lewandowsky, MD;  Location: [facility] OR;  Service: Orthopedics;  Laterality: Left;   TOTAL HIP ARTHROPLASTY Right 05/03/2013   Procedure: TOTAL HIP ARTHROPLASTY;  Surgeon: Nestor Lewandowsky, MD;  Location: [facility] OR;  Service: Orthopedics;  Laterality: Right;   TOTAL KNEE ARTHROPLASTY Right 02/27/2015   Procedure: TOTAL KNEE ARTHROPLASTY;  Surgeon: Gean Birchwood, MD;  Location: [facility] OR;  Service: Orthopedics;  Laterality: Right;   TOTAL SHOULDER ARTHROPLASTY  Right 09/12/2016   Procedure: RIGHT TOTAL SHOULDER ARTHROPLASTY;  Surgeon: Jones Broom, MD;  Location: [facility] OR;  Service: Orthopedics;  Laterality: Right;  RIGHT TOTAL SHOULDER ARTHROPLASTY    Social History:  reports that she has never smoked. She has never used smokeless tobacco. She reports that she does not drink alcohol and does not use drugs.   Allergies  Allergen Reactions   Tramadol Itching and Rash   Belinostat Nausea And Vomiting    Headache and  nausea during infusion. See progress note from 02/24/23   Codeine Itching and Rash   Vicodin [Hydrocodone-Acetaminophen] Itching and Rash    Family History  Adopted: Yes  Problem Relation Age of Onset   Allergic rhinitis Neg Hx    Angioedema Neg Hx    Atopy Neg Hx    Eczema Neg Hx    Immunodeficiency Neg Hx    Urticaria Neg Hx    Asthma Neg Hx      Prior to Admission medications   Medication Sig Start Date End Date Taking? Authorizing Provider  acyclovir (ZOVIRAX) 400 MG tablet Take 1 tablet (400 mg total) by mouth daily. 08/02/23  Yes [name], MD  amLODipine (NORVASC) 5 MG tablet Take 1 tablet (5 mg total) by mouth daily. 08/03/23  Yes Regalado, Belkys A, MD  lidocaine-prilocaine (EMLA) cream Apply to affected area once Patient taking differently: Apply 1 Application topically once. Apply to affected area once 08/02/23  Yes [name], MD  losartan (COZAAR) 100 MG tablet Take 100 mg by mouth daily.   Yes [provider]  Magnesium 200 MG TABS Take 2 tablets (400 mg total) by mouth 2 (two) times daily. 08/03/23  Yes Regalado, Belkys A, MD  methocarbamol (ROBAXIN) 750 MG tablet Take 1 tablet (750 mg total) by mouth every 8 (eight) hours as needed for muscle spasms. 01/08/23  Yes [name], MD  ondansetron (ZOFRAN) 8 MG tablet Take 1 tablet (8 mg total) by mouth every 8 (eight) hours as needed for nausea or vomiting. Start on the third day after chemotherapy. 08/02/23  Yes [name], MD  pantoprazole (PROTONIX) 40 MG tablet Take 1 tablet (40 mg total) by mouth 2 (two) times daily. 08/03/23  Yes Regalado, Belkys A, MD  polyethylene glycol (MIRALAX / GLYCOLAX) 17 g packet Take 17 g by mouth daily. 08/03/23  Yes Regalado, Belkys A, MD  prochlorperazine (COMPAZINE) 10 MG tablet Take 1 tablet (10 mg total) by mouth every 6 (six) hours as needed for nausea or vomiting. 08/02/23  Yes [name], MD  senna-docusate (SENOKOT-S) 8.6-50 MG tablet Take 1 tablet by mouth 2 (two) times daily. 08/03/23  Yes Regalado, Belkys A, MD  sucralfate (CARAFATE) 1 g tablet Dissolve 1 tablet in 5-10ml of water twice a day and then swallow slurry. 06/27/23  Yes [name], MD  triamcinolone ointment (KENALOG) 0.5 % Apply 1 Application topically 2 (  two) times daily. To rash under both armpits Patient taking differently: Apply 1 Application topically 2 (two) times daily as needed (Rash). To rash under both armpits 05/20/23  Yes Kale, Corene Cornea, MD  dexamethasone (DECADRON) 4 MG tablet Take 2 tablets (8 mg total) by mouth daily. Start the day after bendamustine chemotherapy for 2 days. Take with food. Patient not taking: Reported on 08/24/2023 08/02/23   [name], MD  magic mouthwash (multi-ingredient) oral suspension Take 5 mLs by mouth 3 (three) times daily as needed. Patient not taking: Reported on 08/24/2023 06/27/23   [name], MD  potassium chloride SA (KLOR-CON M) 20 MEQ tablet Take 2 tablets (40 mEq total) by mouth daily for 10 days. Patient not taking: Reported on 08/24/2023 08/03/23 08/13/23  Alba Cory, MD    Physical Exam: BP 129/85   Pulse 73   Temp 97.6 F (36.4 C) (Axillary)   Resp 15   SpO2 96%   General: Patient is somnolent, alert, oriented only to self and her daughter.  Otherwise not able to answer questions.  She is able to follow commands. Eyes: EOMI, clear conjuctivae, icteric sclera Cardiovascular: RRR, no murmurs  or rubs, no peripheral edema  Respiratory: clear to auscultation bilaterally, no wheezes, no crackles  Abdomen: soft, diffusely tender, minimally distended Musculoskeletal: no joint effusions, normal range of motion  Neurologic: extraocular muscles intact, clear speech, moving all extremities with intact sensorium         Labs on Admission:  Basic Metabolic Panel: Recent Labs  Lab 08/24/23 0835  NA 137  K 3.5  CL 102  CO2 23  GLUCOSE 94  BUN 41*  CREATININE 1.60*  CALCIUM 14.3*   Liver Function Tests: Recent Labs  Lab 08/24/23 0835  AST 534*  ALT 183*  ALKPHOS 483*  BILITOT 11.1*  PROT 5.6*  ALBUMIN 2.9*   Recent Labs  Lab 08/24/23 0835  LIPASE 640*   Recent Labs  Lab 08/24/23 0835  AMMONIA 37*   CBC: Recent Labs  Lab 08/24/23 0835  WBC 1.5*  NEUTROABS 0.9*  HGB 13.2  HCT 41.1  MCV 88.6  PLT 43*   Cardiac Enzymes: No results for input(s): "CKTOTAL", "CKMB", "CKMBINDEX", "TROPONINI" in the last 168 hours.  BNP (last 3 results) No results for input(s): "BNP" in the last 8760 hours.  ProBNP (last 3 results) No results for input(s): "PROBNP" in the last 8760 hours.  CBG: No results for input(s): "GLUCAP" in the last 168 hours.  Radiological Exams on Admission: CT Angio Chest PE W and/or Wo Contrast  Result Date: 08/24/2023 CLINICAL DATA:  Increasing weakness since Thursday, history of lymphoma EXAM: CT ANGIOGRAPHY CHEST CT ABDOMEN AND PELVIS WITH CONTRAST TECHNIQUE: Multidetector CT imaging of the chest was performed using the standard protocol during bolus administration of intravenous contrast. Multiplanar CT image reconstructions and MIPs were obtained to evaluate the vascular anatomy. Multidetector CT imaging of the abdomen and pelvis was performed using the standard protocol during bolus administration of intravenous contrast. RADIATION DOSE REDUCTION: This exam was performed according to the departmental dose-optimization program which includes  automated exposure control, adjustment of the mA and/or kV according to patient size and/or use of iterative reconstruction technique. CONTRAST:  80mL OMNIPAQUE IOHEXOL 350 MG/ML SOLN COMPARISON:  08/07/2023, 07/31/2023, 05/09/2023 FINDINGS: CTA CHEST FINDINGS Cardiovascular: This is a technically adequate evaluation of the pulmonary vasculature. No filling defects or pulmonary emboli. The heart is unremarkable without pericardial effusion. Ectasia of the thoracic aorta without  evidence of aneurysm or dissection. Atherosclerosis of the aortic arch. Mediastinum/Nodes: Extensive lymphadenopathy is identified, similar in appearance to recent PET scan. Index lymph nodes are as follows: Right cervical chain, 2.9 cm, image 4/4. Left axilla, 1.2 cm short axis, image 50/4 AP window, 1.3 cm short axis, image 54/4. Left hilum, 1.6 cm short axis, image 42/4. Left internal mammary chain, 1.2 cm short axis, image 55/4. The lymphadenopathy has progressed since the 12/28/2022 chest CT, but is similar in appearance to recent PET scan 08/07/2023. Thyroid, trachea, and esophagus are unremarkable. Lungs/Pleura: There are 2 new left upper lobe pulmonary nodules. On image 20/12 there is an 8 mm nodule, which is metabolically active on recent PET scan. On image 55/12 there is a 7 mm nodule, with faint low level activity on recent PET scan. Findings are suspicious for metastatic disease. Hypoventilatory changes are seen at the lung bases. Curvilinear areas of subpleural consolidation at the lung apices may reflect atelectasis. No effusion or pneumothorax. Musculoskeletal: 9 mm subcutaneous nodule in the posterior left shoulder image 43/4 corresponds to a metabolically active lesion on PET scan, and is new since prior CT. No acute or destructive bony abnormalities. Severe degenerative changes of the left shoulder. Unremarkable right shoulder arthroplasty. Reconstructed images demonstrate no additional findings. Review of the MIP images  confirms the above findings. CT ABDOMEN and PELVIS FINDINGS Hepatobiliary: No focal liver abnormality is seen. Status post cholecystectomy. No biliary dilatation. Pancreas: Unremarkable. No pancreatic ductal dilatation or surrounding inflammatory changes. Spleen: Normal in size without focal abnormality. Adrenals/Urinary Tract: Stable appearance of the adrenals and kidneys. No urinary tract calculi or obstruction. Bladder is unremarkable. Stomach/Bowel: No bowel obstruction or ileus. Scattered colonic diverticulosis without diverticulitis. No bowel wall thickening or inflammatory change. Vascular/Lymphatic: Persistent lymphadenopathy throughout the abdomen and pelvis. Index lymph nodes are as follows: Right retrocrural, image 24/2, 12 mm.  Previously 16 mm. Porta hepatis, image 33/2, 13 mm.  No change. Retroperitoneal lymph node mass, image 50/2, 78 x 30 mm, previously 89 x 31 mm. Central mesenteric, image 59/2, 9 mm.  Previously 14 mm. Right inguinal, image 90/2, 11 mm.  Previously 15 mm. Stable aortic atherosclerosis. There is extrinsic compression upon the IVC and bilateral common iliac veins due to the retroperitoneal lymph node mass, stable since prior study. Reproductive: Status post hysterectomy. No adnexal masses. Other: Trace free fluid within the paracolic gutters. No free intraperitoneal gas. Small fat containing supraumbilical ventral hernias are stable. Musculoskeletal: No acute or destructive bony abnormalities. Reconstructed images demonstrate no additional findings. Review of the MIP images confirms the above findings. IMPRESSION: Chest: 1. No evidence of pulmonary embolus. 2. Extensive lymphadenopathy within the neck, axillary regions, mediastinum, and hila. Similar appearance to recent PET scan 08/07/2023, with progression since prior chest CT 12/28/2022. 3. New left upper lobe pulmonary nodule since the prior chest CT, corresponding to metabolically active lesions on recent PET scan, consistent  with metastatic disease. 4.  Aortic Atherosclerosis (ICD10-I70.0). Abdomen/pelvis: 1. Diffuse lymphadenopathy throughout the abdomen and pelvis, minimally decreased in size since the abdominal CT 07/31/2023. No new adenopathy is identified. 2. Trace free fluid within the bilateral paracolic gutters, nonspecific. 3. No other acute intra-abdominal or intrapelvic process. Electronically Signed   By: Sharlet Salina M.D.   On: 08/24/2023 11:06   CT ABDOMEN PELVIS W CONTRAST  Result Date: 08/24/2023 CLINICAL DATA:  Increasing weakness since Thursday, history of lymphoma EXAM: CT ANGIOGRAPHY CHEST CT ABDOMEN AND PELVIS WITH CONTRAST TECHNIQUE: Multidetector CT imaging of the chest  was performed using the standard protocol during bolus administration of intravenous contrast. Multiplanar CT image reconstructions and MIPs were obtained to evaluate the vascular anatomy. Multidetector CT imaging of the abdomen and pelvis was performed using the standard protocol during bolus administration of intravenous contrast. RADIATION DOSE REDUCTION: This exam was performed according to the departmental dose-optimization program which includes automated exposure control, adjustment of the mA and/or kV according to patient size and/or use of iterative reconstruction technique. CONTRAST:  80mL OMNIPAQUE IOHEXOL 350 MG/ML SOLN COMPARISON:  08/07/2023, 07/31/2023, 05/09/2023 FINDINGS: CTA CHEST FINDINGS Cardiovascular: This is a technically adequate evaluation of the pulmonary vasculature. No filling defects or pulmonary emboli. The heart is unremarkable without pericardial effusion. Ectasia of the thoracic aorta without evidence of aneurysm or dissection. Atherosclerosis of the aortic arch. Mediastinum/Nodes: Extensive lymphadenopathy is identified, similar in appearance to recent PET scan. Index lymph nodes are as follows: Right cervical chain, 2.9 cm, image 4/4. Left axilla, 1.2 cm short axis, image 50/4 AP window, 1.3 cm short axis,  image 54/4. Left hilum, 1.6 cm short axis, image 42/4. Left internal mammary chain, 1.2 cm short axis, image 55/4. The lymphadenopathy has progressed since the 12/28/2022 chest CT, but is similar in appearance to recent PET scan 08/07/2023. Thyroid, trachea, and esophagus are unremarkable. Lungs/Pleura: There are 2 new left upper lobe pulmonary nodules. On image 20/12 there is an 8 mm nodule, which is metabolically active on recent PET scan. On image 55/12 there is a 7 mm nodule, with faint low level activity on recent PET scan. Findings are suspicious for metastatic disease. Hypoventilatory changes are seen at the lung bases. Curvilinear areas of subpleural consolidation at the lung apices may reflect atelectasis. No effusion or pneumothorax. Musculoskeletal: 9 mm subcutaneous nodule in the posterior left shoulder image 43/4 corresponds to a metabolically active lesion on PET scan, and is new since prior CT. No acute or destructive bony abnormalities. Severe degenerative changes of the left shoulder. Unremarkable right shoulder arthroplasty. Reconstructed images demonstrate no additional findings. Review of the MIP images confirms the above findings. CT ABDOMEN and PELVIS FINDINGS Hepatobiliary: No focal liver abnormality is seen. Status post cholecystectomy. No biliary dilatation. Pancreas: Unremarkable. No pancreatic ductal dilatation or surrounding inflammatory changes. Spleen: Normal in size without focal abnormality. Adrenals/Urinary Tract: Stable appearance of the adrenals and kidneys. No urinary tract calculi or obstruction. Bladder is unremarkable. Stomach/Bowel: No bowel obstruction or ileus. Scattered colonic diverticulosis without diverticulitis. No bowel wall thickening or inflammatory change. Vascular/Lymphatic: Persistent lymphadenopathy throughout the abdomen and pelvis. Index lymph nodes are as follows: Right retrocrural, image 24/2, 12 mm.  Previously 16 mm. Porta hepatis, image 33/2, 13 mm.  No  change. Retroperitoneal lymph node mass, image 50/2, 78 x 30 mm, previously 89 x 31 mm. Central mesenteric, image 59/2, 9 mm.  Previously 14 mm. Right inguinal, image 90/2, 11 mm.  Previously 15 mm. Stable aortic atherosclerosis. There is extrinsic compression upon the IVC and bilateral common iliac veins due to the retroperitoneal lymph node mass, stable since prior study. Reproductive: Status post hysterectomy. No adnexal masses. Other: Trace free fluid within the paracolic gutters. No free intraperitoneal gas. Small fat containing supraumbilical ventral hernias are stable. Musculoskeletal: No acute or destructive bony abnormalities. Reconstructed images demonstrate no additional findings. Review of the MIP images confirms the above findings. IMPRESSION: Chest: 1. No evidence of pulmonary embolus. 2. Extensive lymphadenopathy within the neck, axillary regions, mediastinum, and hila. Similar appearance to recent PET scan 08/07/2023, with progression since prior  chest CT 12/28/2022. 3. New left upper lobe pulmonary nodule since the prior chest CT, corresponding to metabolically active lesions on recent PET scan, consistent with metastatic disease. 4.  Aortic Atherosclerosis (ICD10-I70.0). Abdomen/pelvis: 1. Diffuse lymphadenopathy throughout the abdomen and pelvis, minimally decreased in size since the abdominal CT 07/31/2023. No new adenopathy is identified. 2. Trace free fluid within the bilateral paracolic gutters, nonspecific. 3. No other acute intra-abdominal or intrapelvic process. Electronically Signed   By: Sharlet Salina M.D.   On: 08/24/2023 11:06   DG Chest Portable 1 View  Result Date: 08/24/2023 CLINICAL DATA:  72 year old female with weakness.  Lymphoma. EXAM: PORTABLE CHEST 1 VIEW COMPARISON:  Portable chest 03/28/2023.  PET-CT 08/07/2023. FINDINGS: Portable AP semi upright view at 0847 hours. Right chest power port remains. Increased mediastinal contour compatible with mediastinal lymphadenopathy  in this setting. Visualized tracheal air column is within normal limits. Low lung volumes. But Allowing for portable technique the lungs are clear. No pneumothorax or pleural effusion. Paucity bowel gas. No acute osseous abnormality identified. Chronic chest wall surgical clips. Chronic right shoulder arthroplasty. IMPRESSION: 1. Evidence of progressed mediastinal lymphadenopathy since July. 2. Low lung volumes, otherwise no acute cardiopulmonary abnormality. Electronically Signed   By: Odessa Fleming M.D.   On: 08/24/2023 09:00   CT Head Wo Contrast  Result Date: 08/24/2023 CLINICAL DATA:  72 year old female with altered mental status. Increasing weakness. Lymphoma. EXAM: CT HEAD WITHOUT CONTRAST TECHNIQUE: Contiguous axial images were obtained from the base of the skull through the vertex without intravenous contrast. RADIATION DOSE REDUCTION: This exam was performed according to the departmental dose-optimization program which includes automated exposure control, adjustment of the mA and/or kV according to patient size and/or use of iterative reconstruction technique. COMPARISON:  Brain MRI 10/27/2021.  Head CT 03/28/2023. FINDINGS: Brain: Cerebral volume remains normal for age. No midline shift, ventriculomegaly, mass effect, evidence of mass lesion, intracranial hemorrhage or evidence of cortically based acute infarction. Gray-white differentiation stable and within normal limits for age. Vascular: No suspicious intracranial vascular hyperdensity. Skull: Bilateral TMJ degeneration. No acute or suspicious osseous lesion. Sinuses/Orbits: Visualized paranasal sinuses and mastoids are stable and well aerated. Other: Visualized orbits and scalp soft tissues are within normal limits. IMPRESSION: Stable and normal for age noncontrast CT appearance of the Brain. Electronically Signed   By: Odessa Fleming M.D.   On: 08/24/2023 08:56    Assessment/Plan Nelva Etheridge is a 72 y.o. female with medical history significant for  breast cancer, anal cancer, most recent diagnosis of non-Hodgkin lymphoma on chemotherapy, essential hypertension and GERD being admitted to the hospital with failure to thrive and concern for worsening lymphoma.   Encephalopathy-likely metabolic due to multiple derangements as detailed below which is very concerning for worsening lymphoma.  Doubt tumor lysis syndrome due to normal potassium, no metabolic acidosis.  Thankfully CT scan of the head without acute findings. -Observation admission -Supportive care -IV fluids -Workup as below  Abnormal LFTs-with rising AST, ALT, total bilirubin up to 11.1.  No clear evidence of intrahepatic ductal dilatation, though there is persistent significant lymphadenopathy. -Check acute hepatitis panel -[facility] has been consulted -Follow-up MRCP potentially could benefit from ERCP  Hypercalcemia-likely due to worsening malignancy, and could explain her encephalopathy as well.  Will continue to hydrate aggressively, and trend calcium levels  Acute renal failure-likely due to dehydration from significantly reduced oral intake.  She also takes losartan at home which is nephrotoxic. -Continue with normal saline hydration -Follow renal function with daily  labs -Hold nephrotoxins, avoid hypotension  Hypertension-holding home antihypertensive medications, will add on as needed hydralazine for systolic blood pressure over 160  Leukopenia and thrombocytopenia-I fear this is due to worsening malignancy, avoid blood thinners, monitor for evidence of acute infection, follow-up oncology recommendations.  History of renal cancer, in remission  History of breast cancer-status post bilateral mastectomy  DVT prophylaxis: SCDs only    Code Status: Full Code, they have not had previous goals of care discussions.  Daughter is now agreeable to palliative care consultation, requests full code in the meantime.  Consults called: [facility], oncology and palliative  care.  Admission status: Observation  Time spent: 49 minutes  Alexzandra Bilton Sharlette Dense MD [facility] Pager [phone]  If 7PM-7AM, please contact night-coverage [email or web address] Password [facility]  08/24/2023, 12:08 PM

## 2023-08-24 NOTE — ED Triage Notes (Signed)
Per EMS from home. Increasing weakness since Thursday. Hx of cancer. Not eating or drinking. Lives at home with daughter. Non compliant with meds. Failure to thrive.  BP 128/82 HR 80 RR 13 CBG 109

## 2023-08-24 NOTE — Consult Note (Signed)
Consultation Note Date: 08/24/2023   Patient Name: Cynthia Velasquez  DOB: May 03, 1951  MRN: [id number]  Age / Sex: 72 y.o., female   PCP: [name], MD Referring Physician: Maryln Gottron, MD  Reason for Consultation: Establishing goals of care     Chief Complaint/History of Present Illness:   Patient is a 72 year old female with a past medical history of breast cancer, anal cancer, and recent diagnosis of non-Hodgkin's lymphoma on chemotherapy, hypertension, and GERD who was admitted on 08/24/2023 for management of decreased oral intake and increased lethargy at home.  Patient had last received chemotherapy on 08/11/2023; under the care of oncologist Dr. Candise Che and coordination of care with [facility] oncology.  Patient's daughter arrived that patient had initially been doing well after chemotherapy though over the past 5 days has gotten progressively weaker, not been eating or drinking well, worsening abdominal pain, and worsening weakness.  Upon presentation to the ER, lab work showing evidence of leukopenia, thrombocytopenia, worsening liver failure, jaundice, and acute renal failure.  Imaging showing continued diffuse lymphadenopathy in setting of non-Hodgkin's lymphoma.  GI consulted for evaluation; MRCP recommended and has been ordered to obtain.  Oncology has been consulted for evaluation.  Palliative medicine team consulted to assist with complex medical decision making.  Reviewed EMR prior to presenting to bedside.  Presented to see patient in the ER.  Discussed care with RN prior to seeing patient. When presenting to bedside, patient sleeping peacefully in the bed.  Patient will occasionally awaken during conversation though is confused quickly falls back asleep.  Patient's daughter present at bedside.  Introduced myself and the role of the palliative medicine team in patient's medical care.  Spent time learning about patient's medical journey with diagnosis of prior cancers and this  gnosis of non-Hodgkin's lymphoma.  Daughter described that prior to a few days ago, patient was driving, working, participating in her own care, and ambulating with assistance of a walker/cane depending on how she was feeling.  Daughter described this has been quite a downturn within the past few days. Daughter also acknowledged that she heard patient's cancer continues to progress.  She "did not know how serious it was".  Acknowledged this and noted importance of continued discussions with medical teams regarding how to best support patient's care moving forward.  At this time attempting to obtain further information to determine if patient has something that is actively reversible like an infection or if this is all secondary to her cancer progression.  Daughter acknowledging this.  Discussed patient's pain management during visit.  Daughter noted patient has been having worsening abdominal pain.  Patient was not on any pain medications at home according to daughter because last medication she was sent home was morphine.  Daughter stated patient "overdosed" I accident on morphine was not taking medication appropriately and ended up needing Narcan.  Discussed will make sure medication received here is very low dose.  Would not recommend use of morphine at this time, particularly with patient and daughter having appropriately negative feelings associated with that after patient overdosed.  Daughter tearful during visit.  Offered emotional support via active listening.  Daughter noted feeling discouraged that she promised her mom she would not bring her to a facility in she ended up bringing her here to the hospital.  Discussed differences in patients needing acute medical care versus a long-term care facility.  Acknowledged hopefully being here at the hospital will be able to provide patient and daughter further information to best  determine pathway for medical care moving forward.  Daughter acknowledged this  and inquired even if things worsen, she would want to be able to follow patient's wishes to be at home.  Acknowledged this and discussed that there are ways to support quality time at the end of life at home should that be determined as the pathway for medical care moving forward.    During visit RN came to bedside and noted patient was being transported to her room upstairs.  Excused myself so patient could be appropriately transported and cared for.  Informed daughter palliative medicine team to continue to follow with patient's medical journey.  Primary Diagnoses  Present on Admission:  Encephalopathy   Past Medical History:  Diagnosis Date   Anal cancer (HCC) dx'd 05/2019   Anxiety    Arthritis    Bilateral breast cancer (HCC) 10/21/2013   Breast cancer (HCC)    Cancer (HCC)    Depression    Gallstones    GERD (gastroesophageal reflux disease)    doesn't take any meds for this   Headache    h/o migraines       History of bladder infections    History of hiatal hernia    History of migraine    Hyperlipidemia    takes Simvastatin daily   Hypertension    takes Hyzaar   Joint pain    Joint swelling    Lumbar stenosis    Lymphoma (HCC)    [facility]   Neuropathy 09/07/2013   Pneumonia    Pre-diabetes    Social History   Socioeconomic History   Marital status: Single    Spouse name: Not on file   Number of children: 1   Years of education: Not on file   Highest education level: Not on file  Occupational History   Not on file  Tobacco Use   Smoking status: Never   Smokeless tobacco: Never  Vaping Use   Vaping status: Never Used  Substance and Sexual Activity   Alcohol use: No    Comment: occ   Drug use: No   Sexual activity: Not Currently  Other Topics Concern   Not on file  Social History Narrative   Are you right handed or left handed? Right   Are you currently employed ? no   What is your current occupation?retired   Do you live at home alone? no   Who lives  with you? Daughter and granddaughter   What type of home do you live in: 1 story or 2 story? one   Caffeine 1 cup a day   Social Determinants of Health   Financial Resource Strain: Not on file  Food Insecurity: No Food Insecurity (08/01/2023)   Hunger Vital Sign    Worried About Running Out of Food in the Last Year: Never true    Ran Out of Food in the Last Year: Never true  Transportation Needs: No Transportation Needs (08/01/2023)   PRAPARE - Administrator, Civil Service (Medical): No    Lack of Transportation (Non-Medical): No  Physical Activity: Not on file  Stress: Not on file  Social Connections: Unknown (01/29/2022)   [facility], [facility]   Social Network    Social Network: Not on file   Family History  Adopted: Yes  Problem Relation Age of Onset   Allergic rhinitis Neg Hx    Angioedema Neg Hx    Atopy Neg Hx    Eczema Neg Hx  Immunodeficiency Neg Hx    Urticaria Neg Hx    Asthma Neg Hx    Scheduled Meds:  pantoprazole (PROTONIX) IV  40 mg Intravenous Daily   Continuous Infusions:  sodium chloride     sodium chloride     PRN Meds:.albuterol, HYDROmorphone (DILAUDID) injection, ondansetron **OR** ondansetron (ZOFRAN) IV, oxyCODONE, traZODone Allergies  Allergen Reactions   Tramadol Itching and Rash   Belinostat Nausea And Vomiting    Headache and  nausea during infusion. See progress note from 02/24/23   Codeine Itching and Rash   Vicodin [Hydrocodone-Acetaminophen] Itching and Rash   CBC:    Component Value Date/Time   WBC 1.5 (L) 08/24/2023 0835   HGB 13.2 08/24/2023 0835   HGB 11.4 (L) 08/11/2023 1306   HGB 13.0 03/11/2017 1550   HCT 41.1 08/24/2023 0835   HCT 40.7 03/11/2017 1550   PLT 43 (L) 08/24/2023 0835   PLT 143 (L) 08/11/2023 1306   PLT 226 03/11/2017 1550   MCV 88.6 08/24/2023 0835   MCV 81.6 03/11/2017 1550   NEUTROABS 0.9 (L) 08/24/2023 0835   NEUTROABS 6.6 (H) 03/11/2017 1550   LYMPHSABS 0.3 (L)  08/24/2023 0835   LYMPHSABS 2.8 03/11/2017 1550   MONOABS 0.1 08/24/2023 0835   MONOABS 0.8 03/11/2017 1550   EOSABS 0.0 08/24/2023 0835   EOSABS 0.2 03/11/2017 1550   BASOSABS 0.0 08/24/2023 0835   BASOSABS 0.0 03/11/2017 1550   Comprehensive Metabolic Panel:    Component Value Date/Time   NA 137 08/24/2023 0835   NA 140 03/11/2017 1551   K 3.5 08/24/2023 0835   K 3.6 03/11/2017 1551   CL 102 08/24/2023 0835   CO2 23 08/24/2023 0835   CO2 28 03/11/2017 1551   BUN 41 (H) 08/24/2023 0835   BUN 11.5 03/11/2017 1551   CREATININE 1.60 (H) 08/24/2023 0835   CREATININE 1.27 (H) 08/11/2023 1306   CREATININE 0.8 03/11/2017 1551   GLUCOSE 94 08/24/2023 0835   GLUCOSE 86 03/11/2017 1551   CALCIUM 14.3 (HH) 08/24/2023 0835   CALCIUM 9.8 03/11/2017 1551   AST 534 (H) 08/24/2023 0835   AST 229 (HH) 08/11/2023 1306   AST 16 03/11/2017 1551   ALT 183 (H) 08/24/2023 0835   ALT 145 (H) 08/11/2023 1306   ALT 14 03/11/2017 1551   ALKPHOS 483 (H) 08/24/2023 0835   ALKPHOS 111 03/11/2017 1551   BILITOT 11.1 (H) 08/24/2023 0835   BILITOT 3.4 (H) 08/11/2023 1306   BILITOT 0.37 03/11/2017 1551   PROT 5.6 (L) 08/24/2023 0835   PROT 7.2 03/11/2017 1551   ALBUMIN 2.9 (L) 08/24/2023 0835   ALBUMIN 3.5 03/11/2017 1551    Physical Exam: Vital Signs: BP 129/85   Pulse 73   Temp 97.6 F (36.4 C) (Axillary)   Resp 15   SpO2 96%  SpO2: SpO2: 96 % O2 Device: O2 Device: Nasal Cannula O2 Flow Rate: O2 Flow Rate (L/min): 3 L/min Intake/output summary: No intake or output data in the 24 hours ending 08/24/23 1220 LBM:   Baseline Weight:   Most recent weight:    General: NAD, laying in bed, lethargic, confused when awake Cardiovascular: RRR Respiratory: no increased work of breathing noted, not in respiratory distress Neuro: Lethargic, confused         Palliative Performance Scale: 10 % (stun current assessment); daughter describing patient was at PPS 70% a couple weeks ago.                Additional Data Reviewed:  Recent Labs    08/24/23 0835  WBC 1.5*  HGB 13.2  PLT 43*  NA 137  BUN 41*  CREATININE 1.60*    Imaging: CT ABDOMEN PELVIS W CONTRAST CLINICAL DATA:  Increasing weakness since Thursday, history of lymphoma  EXAM: CT ANGIOGRAPHY CHEST  CT ABDOMEN AND PELVIS WITH CONTRAST  TECHNIQUE: Multidetector CT imaging of the chest was performed using the standard protocol during bolus administration of intravenous contrast. Multiplanar CT image reconstructions and MIPs were obtained to evaluate the vascular anatomy. Multidetector CT imaging of the abdomen and pelvis was performed using the standard protocol during bolus administration of intravenous contrast.  RADIATION DOSE REDUCTION: This exam was performed according to the departmental dose-optimization program which includes automated exposure control, adjustment of the mA and/or kV according to patient size and/or use of iterative reconstruction technique.  CONTRAST:  80mL OMNIPAQUE IOHEXOL 350 MG/ML SOLN  COMPARISON:  08/07/2023, 07/31/2023, 05/09/2023  FINDINGS: CTA CHEST FINDINGS  Cardiovascular: This is a technically adequate evaluation of the pulmonary vasculature. No filling defects or pulmonary emboli.  The heart is unremarkable without pericardial effusion. Ectasia of the thoracic aorta without evidence of aneurysm or dissection. Atherosclerosis of the aortic arch.  Mediastinum/Nodes: Extensive lymphadenopathy is identified, similar in appearance to recent PET scan. Index lymph nodes are as follows:  Right cervical chain, 2.9 cm, image 4/4.  Left axilla, 1.2 cm short axis, image 50/4  AP window, 1.3 cm short axis, image 54/4.  Left hilum, 1.6 cm short axis, image 42/4.  Left internal mammary chain, 1.2 cm short axis, image 55/4.  The lymphadenopathy has progressed since the 12/28/2022 chest CT, but is similar in appearance to recent PET scan 08/07/2023.  Thyroid, trachea,  and esophagus are unremarkable.  Lungs/Pleura: There are 2 new left upper lobe pulmonary nodules. On image 20/12 there is an 8 mm nodule, which is metabolically active on recent PET scan. On image 55/12 there is a 7 mm nodule, with faint low level activity on recent PET scan. Findings are suspicious for metastatic disease.  Hypoventilatory changes are seen at the lung bases. Curvilinear areas of subpleural consolidation at the lung apices may reflect atelectasis. No effusion or pneumothorax.  Musculoskeletal: 9 mm subcutaneous nodule in the posterior left shoulder image 43/4 corresponds to a metabolically active lesion on PET scan, and is new since prior CT. No acute or destructive bony abnormalities. Severe degenerative changes of the left shoulder. Unremarkable right shoulder arthroplasty. Reconstructed images demonstrate no additional findings.  Review of the MIP images confirms the above findings.  CT ABDOMEN and PELVIS FINDINGS  Hepatobiliary: No focal liver abnormality is seen. Status post cholecystectomy. No biliary dilatation.  Pancreas: Unremarkable. No pancreatic ductal dilatation or surrounding inflammatory changes.  Spleen: Normal in size without focal abnormality.  Adrenals/Urinary Tract: Stable appearance of the adrenals and kidneys. No urinary tract calculi or obstruction. Bladder is unremarkable.  Stomach/Bowel: No bowel obstruction or ileus. Scattered colonic diverticulosis without diverticulitis. No bowel wall thickening or inflammatory change.  Vascular/Lymphatic: Persistent lymphadenopathy throughout the abdomen and pelvis. Index lymph nodes are as follows:  Right retrocrural, image 24/2, 12 mm.  Previously 16 mm.  Porta hepatis, image 33/2, 13 mm.  No change.  Retroperitoneal lymph node mass, image 50/2, 78 x 30 mm, previously 89 x 31 mm.  Central mesenteric, image 59/2, 9 mm.  Previously 14 mm.  Right inguinal, image 90/2, 11 mm.  Previously 15  mm.  Stable aortic atherosclerosis. There is extrinsic compression upon the IVC  and bilateral common iliac veins due to the retroperitoneal lymph node mass, stable since prior study.  Reproductive: Status post hysterectomy. No adnexal masses.  Other: Trace free fluid within the paracolic gutters. No free intraperitoneal gas. Small fat containing supraumbilical ventral hernias are stable.  Musculoskeletal: No acute or destructive bony abnormalities. Reconstructed images demonstrate no additional findings.  Review of the MIP images confirms the above findings.  IMPRESSION: Chest:  1. No evidence of pulmonary embolus. 2. Extensive lymphadenopathy within the neck, axillary regions, mediastinum, and hila. Similar appearance to recent PET scan 08/07/2023, with progression since prior chest CT 12/28/2022. 3. New left upper lobe pulmonary nodule since the prior chest CT, corresponding to metabolically active lesions on recent PET scan, consistent with metastatic disease. 4.  Aortic Atherosclerosis (ICD10-I70.0).  Abdomen/pelvis:  1. Diffuse lymphadenopathy throughout the abdomen and pelvis, minimally decreased in size since the abdominal CT 07/31/2023. No new adenopathy is identified. 2. Trace free fluid within the bilateral paracolic gutters, nonspecific. 3. No other acute intra-abdominal or intrapelvic process.  Electronically Signed   By: Sharlet Salina M.D.   On: 08/24/2023 11:06 CT Angio Chest PE W and/or Wo Contrast CLINICAL DATA:  Increasing weakness since Thursday, history of lymphoma  EXAM: CT ANGIOGRAPHY CHEST  CT ABDOMEN AND PELVIS WITH CONTRAST  TECHNIQUE: Multidetector CT imaging of the chest was performed using the standard protocol during bolus administration of intravenous contrast. Multiplanar CT image reconstructions and MIPs were obtained to evaluate the vascular anatomy. Multidetector CT imaging of the abdomen and pelvis was performed using the  standard protocol during bolus administration of intravenous contrast.  RADIATION DOSE REDUCTION: This exam was performed according to the departmental dose-optimization program which includes automated exposure control, adjustment of the mA and/or kV according to patient size and/or use of iterative reconstruction technique.  CONTRAST:  80mL OMNIPAQUE IOHEXOL 350 MG/ML SOLN  COMPARISON:  08/07/2023, 07/31/2023, 05/09/2023  FINDINGS: CTA CHEST FINDINGS  Cardiovascular: This is a technically adequate evaluation of the pulmonary vasculature. No filling defects or pulmonary emboli.  The heart is unremarkable without pericardial effusion. Ectasia of the thoracic aorta without evidence of aneurysm or dissection. Atherosclerosis of the aortic arch.  Mediastinum/Nodes: Extensive lymphadenopathy is identified, similar in appearance to recent PET scan. Index lymph nodes are as follows:  Right cervical chain, 2.9 cm, image 4/4.  Left axilla, 1.2 cm short axis, image 50/4  AP window, 1.3 cm short axis, image 54/4.  Left hilum, 1.6 cm short axis, image 42/4.  Left internal mammary chain, 1.2 cm short axis, image 55/4.  The lymphadenopathy has progressed since the 12/28/2022 chest CT, but is similar in appearance to recent PET scan 08/07/2023.  Thyroid, trachea, and esophagus are unremarkable.  Lungs/Pleura: There are 2 new left upper lobe pulmonary nodules. On image 20/12 there is an 8 mm nodule, which is metabolically active on recent PET scan. On image 55/12 there is a 7 mm nodule, with faint low level activity on recent PET scan. Findings are suspicious for metastatic disease.  Hypoventilatory changes are seen at the lung bases. Curvilinear areas of subpleural consolidation at the lung apices may reflect atelectasis. No effusion or pneumothorax.  Musculoskeletal: 9 mm subcutaneous nodule in the posterior left shoulder image 43/4 corresponds to a metabolically active lesion  on PET scan, and is new since prior CT. No acute or destructive bony abnormalities. Severe degenerative changes of the left shoulder. Unremarkable right shoulder arthroplasty. Reconstructed images demonstrate no additional findings.  Review of the MIP images  confirms the above findings.  CT ABDOMEN and PELVIS FINDINGS  Hepatobiliary: No focal liver abnormality is seen. Status post cholecystectomy. No biliary dilatation.  Pancreas: Unremarkable. No pancreatic ductal dilatation or surrounding inflammatory changes.  Spleen: Normal in size without focal abnormality.  Adrenals/Urinary Tract: Stable appearance of the adrenals and kidneys. No urinary tract calculi or obstruction. Bladder is unremarkable.  Stomach/Bowel: No bowel obstruction or ileus. Scattered colonic diverticulosis without diverticulitis. No bowel wall thickening or inflammatory change.  Vascular/Lymphatic: Persistent lymphadenopathy throughout the abdomen and pelvis. Index lymph nodes are as follows:  Right retrocrural, image 24/2, 12 mm.  Previously 16 mm.  Porta hepatis, image 33/2, 13 mm.  No change.  Retroperitoneal lymph node mass, image 50/2, 78 x 30 mm, previously 89 x 31 mm.  Central mesenteric, image 59/2, 9 mm.  Previously 14 mm.  Right inguinal, image 90/2, 11 mm.  Previously 15 mm.  Stable aortic atherosclerosis. There is extrinsic compression upon the IVC and bilateral common iliac veins due to the retroperitoneal lymph node mass, stable since prior study.  Reproductive: Status post hysterectomy. No adnexal masses.  Other: Trace free fluid within the paracolic gutters. No free intraperitoneal gas. Small fat containing supraumbilical ventral hernias are stable.  Musculoskeletal: No acute or destructive bony abnormalities. Reconstructed images demonstrate no additional findings.  Review of the MIP images confirms the above findings.  IMPRESSION: Chest:  1. No evidence of pulmonary  embolus. 2. Extensive lymphadenopathy within the neck, axillary regions, mediastinum, and hila. Similar appearance to recent PET scan 08/07/2023, with progression since prior chest CT 12/28/2022. 3. New left upper lobe pulmonary nodule since the prior chest CT, corresponding to metabolically active lesions on recent PET scan, consistent with metastatic disease. 4.  Aortic Atherosclerosis (ICD10-I70.0).  Abdomen/pelvis:  1. Diffuse lymphadenopathy throughout the abdomen and pelvis, minimally decreased in size since the abdominal CT 07/31/2023. No new adenopathy is identified. 2. Trace free fluid within the bilateral paracolic gutters, nonspecific. 3. No other acute intra-abdominal or intrapelvic process.  Electronically Signed   By: Sharlet Salina M.D.   On: 08/24/2023 11:06 DG Chest Portable 1 View CLINICAL DATA:  72 year old female with weakness.  Lymphoma.  EXAM: PORTABLE CHEST 1 VIEW  COMPARISON:  Portable chest 03/28/2023.  PET-CT 08/07/2023.  FINDINGS: Portable AP semi upright view at 0847 hours. Right chest power port remains. Increased mediastinal contour compatible with mediastinal lymphadenopathy in this setting. Visualized tracheal air column is within normal limits. Low lung volumes. But Allowing for portable technique the lungs are clear. No pneumothorax or pleural effusion. Paucity bowel gas. No acute osseous abnormality identified. Chronic chest wall surgical clips. Chronic right shoulder arthroplasty.  IMPRESSION: 1. Evidence of progressed mediastinal lymphadenopathy since July. 2. Low lung volumes, otherwise no acute cardiopulmonary abnormality.  Electronically Signed   By: Odessa Fleming M.D.   On: 08/24/2023 09:00 CT Head Wo Contrast CLINICAL DATA:  72 year old female with altered mental status. Increasing weakness. Lymphoma.  EXAM: CT HEAD WITHOUT CONTRAST  TECHNIQUE: Contiguous axial images were obtained from the base of the skull through the vertex  without intravenous contrast.  RADIATION DOSE REDUCTION: This exam was performed according to the departmental dose-optimization program which includes automated exposure control, adjustment of the mA and/or kV according to patient size and/or use of iterative reconstruction technique.  COMPARISON:  Brain MRI 10/27/2021.  Head CT 03/28/2023.  FINDINGS: Brain: Cerebral volume remains normal for age. No midline shift, ventriculomegaly, mass effect, evidence of mass lesion, intracranial hemorrhage or evidence of  cortically based acute infarction. Gray-white differentiation stable and within normal limits for age.  Vascular: No suspicious intracranial vascular hyperdensity.  Skull: Bilateral TMJ degeneration. No acute or suspicious osseous lesion.  Sinuses/Orbits: Visualized paranasal sinuses and mastoids are stable and well aerated.  Other: Visualized orbits and scalp soft tissues are within normal limits.  IMPRESSION: Stable and normal for age noncontrast CT appearance of the Brain.  Electronically Signed   By: Odessa Fleming M.D.   On: 08/24/2023 08:56    I personally reviewed recent imaging.   Palliative Care Assessment and Plan Summary of Established Goals of Care and Medical Treatment Preferences   Patient is a 72 year old female with a past medical history of breast cancer, anal cancer, and recent diagnosis of non-Hodgkin's lymphoma on chemotherapy, hypertension, and GERD who was admitted on 08/24/2023 for management of decreased oral intake and increased lethargy at home.  Patient had last received chemotherapy on 08/11/2023; under the care of oncologist Dr. Candise Che and coordination of care with [facility] oncology.  Patient's daughter arrived that patient had initially been doing well after chemotherapy though over the past 5 days has gotten progressively weaker, not been eating or drinking well, worsening abdominal pain, and worsening weakness.  Upon presentation to the ER, lab  work showing evidence of leukopenia, thrombocytopenia, worsening liver failure, jaundice, and acute renal failure.  Imaging showing continued diffuse lymphadenopathy in setting of non-Hodgkin's lymphoma.  GI consulted for evaluation; MRCP recommended and has been ordered to obtain.  Oncology has been consulted for evaluation.  Palliative medicine team consulted to assist with complex medical decision making.  # Complex medical decision making/goals of care  -Patient unable to participate in complex medical decision making at that time.  -Discussed care with patient's daughter at bedside as detailed above in HPI.  Daughter acknowledged hearing severity of patient's cancer progression.  Currently awaiting input from GI and oncology regarding possible reversible causes of worsening medical status versus it being related to progression of cancer.  Continuing appropriate medical interventions at this time.  Daughter open to further discussions about pathways for medical care moving forward based on input from all medical teams.  Acknowledged and noted palliative medicine team will continue to engage in conversations as able.  -  Code Status: Full Code    -Daughter very overwhelmed and tearful at time of meeting so did not address CODE STATUS at that time.  # Symptom management  -Pain, acute in setting of non-Hodgkin's lymphoma Daughter described that patiently recently sent home on morphine and "overdosed" requiring Narcan.  Would not recommend use of morphine at this time due to patient's kidney function anyway.   -Change IV Dilaudid to 0.2-0.5 mg every 3 hours as needed for severe pain   -Change oral oxycodone to 2.5-5 mg every 4 hours as needed  # Psycho-social/Spiritual Support:  - Support System: daughter  # Discharge Planning:  To Be Determined  Thank you for allowing the palliative care team to participate in the care [name].  [name], DO Palliative Care Provider PMT #  [phone]  If patient remains symptomatic despite maximum doses, please call PMT at [phone] between 0700 and 1900. Outside of these hours, please call attending, as PMT does not have night coverage.  Personally spent 82 minutes in patient care including extensive chart review (labs, imaging, progress/consult notes, vital signs), medically appropraite exam, discussed with treatment team, education to patient, family, and staff, documenting clinical information, medication review and management, coordination of care, and  available advanced directive documents.   *Please note that this is a verbal dictation therefore any spelling or grammatical errors are due to the "Dragon Medical One" system interpretation.

## 2023-08-24 NOTE — Plan of Care (Signed)

## 2023-08-25 ENCOUNTER — Other Ambulatory Visit: Payer: Self-pay | Admitting: Hematology

## 2023-08-25 DIAGNOSIS — C8442 Peripheral T-cell lymphoma, not classified, intrathoracic lymph nodes: Principal | ICD-10-CM

## 2023-08-25 DIAGNOSIS — R932 Abnormal findings on diagnostic imaging of liver and biliary tract: Secondary | ICD-10-CM | POA: Diagnosis not present

## 2023-08-25 DIAGNOSIS — E785 Hyperlipidemia, unspecified: Secondary | ICD-10-CM | POA: Diagnosis not present

## 2023-08-25 DIAGNOSIS — R4182 Altered mental status, unspecified: Secondary | ICD-10-CM | POA: Diagnosis present

## 2023-08-25 DIAGNOSIS — T451X5A Adverse effect of antineoplastic and immunosuppressive drugs, initial encounter: Secondary | ICD-10-CM | POA: Diagnosis not present

## 2023-08-25 DIAGNOSIS — N179 Acute kidney failure, unspecified: Secondary | ICD-10-CM

## 2023-08-25 DIAGNOSIS — E876 Hypokalemia: Secondary | ICD-10-CM | POA: Diagnosis not present

## 2023-08-25 DIAGNOSIS — D709 Neutropenia, unspecified: Secondary | ICD-10-CM

## 2023-08-25 DIAGNOSIS — K5730 Diverticulosis of large intestine without perforation or abscess without bleeding: Secondary | ICD-10-CM | POA: Diagnosis not present

## 2023-08-25 DIAGNOSIS — Z7189 Other specified counseling: Secondary | ICD-10-CM | POA: Diagnosis not present

## 2023-08-25 DIAGNOSIS — E8809 Other disorders of plasma-protein metabolism, not elsewhere classified: Secondary | ICD-10-CM | POA: Diagnosis not present

## 2023-08-25 DIAGNOSIS — D61810 Antineoplastic chemotherapy induced pancytopenia: Secondary | ICD-10-CM | POA: Diagnosis not present

## 2023-08-25 DIAGNOSIS — F32A Depression, unspecified: Secondary | ICD-10-CM | POA: Diagnosis not present

## 2023-08-25 DIAGNOSIS — G893 Neoplasm related pain (acute) (chronic): Secondary | ICD-10-CM | POA: Diagnosis not present

## 2023-08-25 DIAGNOSIS — Z79899 Other long term (current) drug therapy: Secondary | ICD-10-CM | POA: Diagnosis not present

## 2023-08-25 DIAGNOSIS — R1084 Generalized abdominal pain: Secondary | ICD-10-CM | POA: Diagnosis not present

## 2023-08-25 DIAGNOSIS — R531 Weakness: Secondary | ICD-10-CM | POA: Diagnosis not present

## 2023-08-25 DIAGNOSIS — D696 Thrombocytopenia, unspecified: Secondary | ICD-10-CM | POA: Diagnosis not present

## 2023-08-25 DIAGNOSIS — R945 Abnormal results of liver function studies: Secondary | ICD-10-CM | POA: Diagnosis not present

## 2023-08-25 DIAGNOSIS — K7290 Hepatic failure, unspecified without coma: Secondary | ICD-10-CM | POA: Diagnosis not present

## 2023-08-25 DIAGNOSIS — N1832 Chronic kidney disease, stage 3b: Secondary | ICD-10-CM | POA: Diagnosis not present

## 2023-08-25 DIAGNOSIS — E860 Dehydration: Secondary | ICD-10-CM | POA: Diagnosis not present

## 2023-08-25 DIAGNOSIS — C8443 Peripheral T-cell lymphoma, not classified, intra-abdominal lymph nodes: Secondary | ICD-10-CM | POA: Diagnosis not present

## 2023-08-25 DIAGNOSIS — R748 Abnormal levels of other serum enzymes: Secondary | ICD-10-CM | POA: Diagnosis not present

## 2023-08-25 DIAGNOSIS — Z1152 Encounter for screening for COVID-19: Secondary | ICD-10-CM | POA: Diagnosis not present

## 2023-08-25 DIAGNOSIS — G131 Other systemic atrophy primarily affecting central nervous system in neoplastic disease: Secondary | ICD-10-CM | POA: Diagnosis not present

## 2023-08-25 DIAGNOSIS — R627 Adult failure to thrive: Secondary | ICD-10-CM | POA: Diagnosis not present

## 2023-08-25 DIAGNOSIS — Z8572 Personal history of non-Hodgkin lymphomas: Secondary | ICD-10-CM | POA: Diagnosis not present

## 2023-08-25 DIAGNOSIS — R4589 Other symptoms and signs involving emotional state: Secondary | ICD-10-CM | POA: Diagnosis not present

## 2023-08-25 DIAGNOSIS — E870 Hyperosmolality and hypernatremia: Secondary | ICD-10-CM | POA: Diagnosis not present

## 2023-08-25 DIAGNOSIS — G9340 Encephalopathy, unspecified: Secondary | ICD-10-CM | POA: Diagnosis not present

## 2023-08-25 DIAGNOSIS — Z515 Encounter for palliative care: Secondary | ICD-10-CM | POA: Diagnosis not present

## 2023-08-25 DIAGNOSIS — R109 Unspecified abdominal pain: Secondary | ICD-10-CM | POA: Diagnosis not present

## 2023-08-25 DIAGNOSIS — R410 Disorientation, unspecified: Secondary | ICD-10-CM | POA: Diagnosis not present

## 2023-08-25 DIAGNOSIS — I129 Hypertensive chronic kidney disease with stage 1 through stage 4 chronic kidney disease, or unspecified chronic kidney disease: Secondary | ICD-10-CM | POA: Diagnosis not present

## 2023-08-25 DIAGNOSIS — G9341 Metabolic encephalopathy: Secondary | ICD-10-CM

## 2023-08-25 DIAGNOSIS — Z66 Do not resuscitate: Secondary | ICD-10-CM | POA: Diagnosis not present

## 2023-08-25 DIAGNOSIS — D6959 Other secondary thrombocytopenia: Secondary | ICD-10-CM | POA: Diagnosis not present

## 2023-08-25 DIAGNOSIS — K859 Acute pancreatitis without necrosis or infection, unspecified: Secondary | ICD-10-CM | POA: Diagnosis not present

## 2023-08-25 DIAGNOSIS — G3189 Other specified degenerative diseases of nervous system: Secondary | ICD-10-CM | POA: Diagnosis not present

## 2023-08-25 LAB — GLUCOSE, CAPILLARY
Glucose-Capillary: 103 mg/dL — ABNORMAL HIGH (ref 70–99)
Glucose-Capillary: 118 mg/dL — ABNORMAL HIGH (ref 70–99)
Glucose-Capillary: 176 mg/dL — ABNORMAL HIGH (ref 70–99)
Glucose-Capillary: 183 mg/dL — ABNORMAL HIGH (ref 70–99)
Glucose-Capillary: 204 mg/dL — ABNORMAL HIGH (ref 70–99)
Glucose-Capillary: 65 mg/dL — ABNORMAL LOW (ref 70–99)
Glucose-Capillary: 78 mg/dL (ref 70–99)

## 2023-08-25 LAB — CBC
HCT: 35 % — ABNORMAL LOW (ref 36.0–46.0)
Hemoglobin: 11.3 g/dL — ABNORMAL LOW (ref 12.0–15.0)
MCH: 28.3 pg (ref 26.0–34.0)
MCHC: 32.3 g/dL (ref 30.0–36.0)
MCV: 87.7 fL (ref 80.0–100.0)
Platelets: 35 10*3/uL — ABNORMAL LOW (ref 150–400)
RBC: 3.99 MIL/uL (ref 3.87–5.11)
RDW: 26.9 % — ABNORMAL HIGH (ref 11.5–15.5)
WBC: 1.5 10*3/uL — ABNORMAL LOW (ref 4.0–10.5)
nRBC: 4.1 % — ABNORMAL HIGH (ref 0.0–0.2)

## 2023-08-25 LAB — COMPREHENSIVE METABOLIC PANEL
ALT: 150 U/L — ABNORMAL HIGH (ref 0–44)
AST: 446 U/L — ABNORMAL HIGH (ref 15–41)
Albumin: 2.5 g/dL — ABNORMAL LOW (ref 3.5–5.0)
Alkaline Phosphatase: 424 U/L — ABNORMAL HIGH (ref 38–126)
Anion gap: 14 (ref 5–15)
BUN: 49 mg/dL — ABNORMAL HIGH (ref 8–23)
CO2: 20 mmol/L — ABNORMAL LOW (ref 22–32)
Calcium: 13.8 mg/dL (ref 8.9–10.3)
Chloride: 109 mmol/L (ref 98–111)
Creatinine, Ser: 2.03 mg/dL — ABNORMAL HIGH (ref 0.44–1.00)
GFR, Estimated: 26 mL/min — ABNORMAL LOW (ref >60)
Glucose, Bld: 63 mg/dL — ABNORMAL LOW (ref 70–99)
Potassium: 3.6 mmol/L (ref 3.5–5.1)
Sodium: 143 mmol/L (ref 135–145)
Total Bilirubin: 8.7 mg/dL — ABNORMAL HIGH (ref <1.2)
Total Protein: 4.8 g/dL — ABNORMAL LOW (ref 6.5–8.1)

## 2023-08-25 MED ORDER — CHLORHEXIDINE GLUCONATE CLOTH 2 % EX PADS
6.0000 | MEDICATED_PAD | Freq: Every day | CUTANEOUS | Status: DC
  Administered 2023-08-25 – 2023-09-09 (×15): 6 via TOPICAL

## 2023-08-25 MED ORDER — METHYLPREDNISOLONE SODIUM SUCC 125 MG IJ SOLR
125.0000 mg | Freq: Two times a day (BID) | INTRAMUSCULAR | Status: DC
  Administered 2023-08-25 – 2023-09-06 (×25): 125 mg via INTRAVENOUS
  Filled 2023-08-25 (×25): qty 2

## 2023-08-25 MED ORDER — DEXTROSE-SODIUM CHLORIDE 5-0.2 % IV SOLN: INTRAVENOUS | Status: AC

## 2023-08-25 MED ORDER — CALCITONIN (SALMON) 200 UNIT/ML IJ SOLN
4.0000 [IU]/kg | Freq: Two times a day (BID) | INTRAMUSCULAR | Status: AC
  Administered 2023-08-25 – 2023-08-26 (×4): 324 [IU] via SUBCUTANEOUS
  Filled 2023-08-25 (×4): qty 1.62

## 2023-08-25 MED ORDER — ZOLEDRONIC ACID 4 MG/100ML IV SOLN
4.0000 mg | Freq: Once | INTRAVENOUS | Status: AC
  Administered 2023-08-25: 4 mg via INTRAVENOUS
  Filled 2023-08-25 (×2): qty 100

## 2023-08-25 MED ORDER — SODIUM CHLORIDE 0.9% FLUSH: 10.0000 mL | INTRAVENOUS | Status: DC | PRN

## 2023-08-25 MED ORDER — DEXTROSE 50 % IV SOLN
25.0000 mL | Freq: Once | INTRAVENOUS | Status: AC
  Administered 2023-08-25: 25 mL via INTRAVENOUS
  Filled 2023-08-25: qty 50

## 2023-08-25 NOTE — Progress Notes (Signed)
Daily Progress Note   Patient Name: Cynthia Velasquez       Date: 08/25/2023 DOB: [birth year]  Age: 72 y.o. MRN#: [id number] Attending Physician: Lewie Chamber, MD Primary Care Physician: [name], MD Admit Date: 08/24/2023 Length of Stay: 0 days  Reason for Consultation/Follow-up: Establishing goals of care  Subjective:   CC: Patient remains confused and lethargic when seen today.  Spoke with patient's daughter at bedside.  Following up regarding complex medical decision making.  Subjective:  Reviewed EMR prior to presenting to bedside.  EMR reviewed showing creatinine increased to 2.03 and bilirubin increased to 8.7.  Awaiting GI and oncology recommendations.  Presented to bedside to meet with patient.  Patient laying in bed, grimacing at times.  Patient appears agitated and uncomfortable.  Patient lethargic and confused and unable to participate in complex medical decision making.   Spoke with patient's daughter, [name], at bedside.  Again introduced myself as a member of the palliative medicine team though she remembers speaking to this provider in the ER. Daughter notes she already spoke with hospitalist this morning regarding hypercalcemia management.  He is currently awaiting further input from GI and oncology to determine next possible steps in medical care.  Again discussed importance of continuing conversations to determine most appropriate pathway for medical care based on patient's quality of life.  Daughter again acknowledged that patient would not want to be stuck "in a facility".  Noted this would be important to continue in further conversations.  Discussed family support for patient's daughter.  [name] noted that she is patient's family in this area.  Patient is not married.  Patient's next assist relatives are in [place].  Discussed based on [place] law, [name] would be patient's medical decision maker when patient is unable to speak wishes for medical care  herself. With permission, inquired if patient had ever completed advance care planning documentation.  Daughter noted that she believes patient was going to work on this eventually though does not know if she completed final paperwork. Able to discuss CODE STATUS with [name].  Expressed concern that with patient's underlying cancer if she is sick enough that her heart physically stops or she stops breathing, interventions such as cardiac resuscitation and intubation with mechanical ventilation would tie her to a facility and not likely lead to quality of life outcomes.  Discussed patient can be DNR and continue all appropriate medical interventions up into that point.  Daughter acknowledges and noted would consider.  Daughter voiced being hopeful to continue appropriate medical interventions at this time and hoping something would be reversible in patient's current condition. If not condition not reversible, likely open to discussions about how to support quality of life at the end of life at home. Spent time providing emotional support be active listening.  Noted palliative medicine team to continue following with patient's medical journey.  Objective:   Vital Signs:  BP 119/81 (BP Location: Right Arm)   Pulse 98   Temp 98.2 F (36.8 C)   Resp 16   Ht 5\' 5"  (1.651 m)   Wt 80.8 kg   SpO2 95%   BMI 29.65 kg/m   Physical Exam: General: In bed, lethargic, confused, grimacing at times, appears uncomfortable Cardiovascular: RRR Neuro: Lethargic, confused  Imaging: I personally reviewed recent imaging.   Assessment & Plan:   Assessment: Patient is a 72 year old female with a past medical history of breast cancer, anal cancer, and recent diagnosis of non-Hodgkin's lymphoma on chemotherapy, hypertension, and GERD who was  admitted on 08/24/2023 for management of decreased oral intake and increased lethargy at home.  Patient had last received chemotherapy on 08/11/2023; under the care of oncologist  Dr. Candise Che and coordination of care with [facility] oncology.  Patient's daughter arrived that patient had initially been doing well after chemotherapy though over the past 5 days has gotten progressively weaker, not been eating or drinking well, worsening abdominal pain, and worsening weakness.  Upon presentation to the ER, lab work showing evidence of leukopenia, thrombocytopenia, worsening liver failure, jaundice, and acute renal failure.  Imaging showing continued diffuse lymphadenopathy in setting of non-Hodgkin's lymphoma.  GI consulted for evaluation.  Oncology has been consulted for evaluation.  Palliative medicine team consulted to assist with complex medical decision making.   Recommendations/Plan: # Complex medical decision making/goals of care:      -Patient unable to participate in complex medical decision making at that time.                -Discussed care with patient's daughter at bedside as detailed above in HPI.  Daughter awaiting input from GI and oncology regarding possible reversible causes of worsening medical status versus it being related to progression of cancer.  Continuing appropriate medical interventions at this time.  Daughter continues to be open to discussions regarding pathways for medical care moving forward based on input from specialist.                -  Code Status: Full Code                                -With permission able to discuss full code versus DNR, continue appropriate medical interventions with patient's daughter at bedside.  Daughter stated that patient's primary goal was to not end up "in a facility".  Discussed how interventions such as cardiac resuscitation and intubation with mechanical ventilation if patient's heart were to stop or she were to stop breathing would tie her to being in the hospital and likely not lead to quality of life outcomes.  Daughter will consider.   # Symptom management                -Pain, acute in setting of non-Hodgkin's  lymphoma                               -Continue IV Dilaudid to 0.2-0.5 mg every 3 hours as needed for severe pain                               -Continue oral oxycodone to 2.5-5 mg every 4 hours as needed   # Psycho-social/Spiritual Support:  - Support System: daughter   # Discharge Planning:  To Be Determined  Discussed with: patient's daughter, RN, hospitalist  Thank you for allowing the palliative care team to participate in the care [name].  [name], DO Palliative Care Provider PMT # [phone]  If patient remains symptomatic despite maximum doses, please call PMT at [phone] between 0700 and 1900. Outside of these hours, please call attending, as PMT does not have night coverage.  Personally spent 52 minutes in patient care including extensive chart review (labs, imaging, progress/consult notes, vital signs), medically appropraite exam, discussed with treatment team, education to patient, family, and staff, documenting clinical information, medication review and management,  coordination of care, and available advanced directive documents.   *Please note that this is a verbal dictation therefore any spelling or grammatical errors are due to the "Dragon Medical One" system interpretation.

## 2023-08-25 NOTE — Care Management Obs Status (Signed)
MEDICARE OBSERVATION STATUS NOTIFICATION   Patient Details  Name: Cynthia Velasquez MRN: [id number] Date of Birth: 12-12-1950   Medicare Observation Status Notification Given:  Yes    Otelia Santee, LCSW 08/25/2023, 9:13 AM

## 2023-08-25 NOTE — TOC Initial Note (Signed)
Transition of Care [facility]) - Initial/Assessment Note    Patient Details  Name: Cynthia Velasquez MRN: [id number] Date of Birth: 1950-12-22  [facility] of [facility] CM/SW Contact:    Otelia Santee, LCSW Phone Number: 08/25/2023, 1:26 PM  Clinical Narrative:                 Pt from home with daughter. Pt typically ambulates with RW. TOC will follow for discharge planning needs.   Expected Discharge Plan: Home/Self Care Barriers to Discharge: Continued Medical Work up   Patient Goals and CMS Choice Patient states their goals for this hospitalization and ongoing recovery are:: For pt to return home          Expected Discharge Plan and Services In-house Referral: NA Discharge Planning Services: NA Post Acute Care Choice:  (Unknown) Living arrangements for the past 2 months: Apartment                                      Prior Living Arrangements/Services Living arrangements for the past 2 months: Apartment Lives with:: Adult Children Patient language and need for interpreter reviewed:: Yes Do you feel safe going back to the place where you live?: Yes      Need for Family Participation in Patient Care: Yes (Comment) Care giver support system in place?: Yes (comment) Current home services: DME Criminal Activity/Legal Involvement Pertinent to Current Situation/Hospitalization: No - Comment as needed  Activities of Daily Living   ADL Screening (condition at time of admission) Independently performs ADLs?: No Does the patient have a NEW difficulty with bathing/dressing/toileting/self-feeding that is expected to last >3 days?: Yes (Initiates electronic notice to provider for possible OT consult) Does the patient have a NEW difficulty with getting in/out of bed, walking, or climbing stairs that is expected to last >3 days?: Yes (Initiates electronic notice to provider for possible PT consult) Does the patient have a NEW difficulty with communication that is expected to  last >3 days?: Yes (Initiates electronic notice to provider for possible SLP consult) Is the patient deaf or have difficulty hearing?: No Does the patient have difficulty seeing, even when wearing glasses/contacts?: No Does the patient have difficulty concentrating, remembering, or making decisions?: Yes  Permission Sought/Granted Permission sought to share information with : Family Supports Permission granted to share information with : Yes, Verbal Permission Granted              Emotional Assessment   Attitude/Demeanor/Rapport: Unable to Assess Affect (typically observed): Unable to Assess Orientation: : Oriented to Self Alcohol / Substance Use: Not Applicable Psych Involvement: No (comment)  Admission diagnosis:  Hypercalcemia [E83.52] Hyperbilirubinemia [E80.6] Encephalopathy [G93.40] Generalized weakness [R53.1] Encephalopathy, unspecified type [G93.40] Acute metabolic encephalopathy [G93.41] Patient Active Problem List   Diagnosis Date Noted   Acute metabolic encephalopathy 08/25/2023   Elevated liver enzymes 08/25/2023   Neutropenia (HCC) 08/25/2023   Encephalopathy 08/24/2023   Palliative care encounter 08/24/2023   Generalized weakness 08/24/2023   Hypercalcemia 08/24/2023   Hyperbilirubinemia 08/24/2023   High risk medication use 08/24/2023   Cancer associated pain 08/24/2023   Medication management 08/24/2023   Need for emotional support 08/24/2023   Goals of care, counseling/discussion 08/24/2023   Biliary obstruction 08/01/2023   Transaminitis 08/01/2023   Generalized abdominal pain 08/01/2023   AKI (acute kidney injury) (HCC) 08/01/2023   Depression 08/01/2023   GAD (generalized anxiety disorder) 08/01/2023   Syncope and  collapse 12/29/2022   Hypomagnesemia 12/29/2022   Perianal pain 11/05/2022   Vaginal candidiasis 11/04/2022   Hypokalemia 10/28/2022   Pancytopenia (HCC) 10/28/2022   Port-A-Cath in place 10/01/2022   Chemotherapy-induced  neuropathy (HCC) 09/03/2022   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   Incontinence of feces 09/28/2020   Essential hypertension 09/27/2020   GERD without esophagitis 09/27/2020   History of bilateral mastectomy 09/27/2020   Hyperglycemia 09/27/2020   Moderate recurrent major depression (HCC) 09/27/2020   Pure hypercholesterolemia 09/27/2020   Anal cancer (HCC) 06/13/2019   Status post total shoulder arthroplasty, right 09/12/2016   Arthritis of knee 02/27/2015   Primary osteoarthritis of right knee Valgus 02/24/2015   Neuropathy 09/07/2013   Breast cancer of upper-outer quadrant of left female breast (HCC) 08/09/2013   Breast cancer of upper-outer quadrant of right female breast (HCC) 08/09/2013   Osteoarthritis of right hip 05/03/2013   Osteoarthritis of left hip 02/15/2013   PCP:  [name], MD Pharmacy:   [id number] [place], [place] - [address] AT [facility] [address] [place] [address] Phone: [phone] Fax: [phone]     Social Determinants of Health (SDOH) Social History: SDOH Screenings   Food Insecurity: No Food Insecurity (08/24/2023)  Housing: Medium Risk (08/24/2023)  Transportation Needs: No Transportation Needs (08/24/2023)  Utilities: Not At Risk (08/24/2023)  Social Connections: Unknown (01/29/2022)   Received from [facility]  Tobacco Use: Low Risk  (08/24/2023)   SDOH Interventions: Housing Interventions: Inpatient TOC, Intervention Not Indicated   Readmission Risk Interventions    08/25/2023    1:23 PM 08/01/2023   12:15 PM 10/30/2022    2:47 PM  Readmission Risk Prevention Plan  Transportation Screening Complete Complete Complete  PCP or Specialist Appt within 5-7 Days  Complete Complete  PCP or Specialist Appt within 3-5 Days Complete    Home Care Screening  Complete Complete  Medication Review (RN CM)  Complete Complete  HRI or Home Care Consult Complete    Social Work  Consult for Recovery Care Planning/Counseling Complete    Palliative Care Screening Complete    Medication Review Oceanographer) Complete

## 2023-08-25 NOTE — Plan of Care (Signed)
Received patient in bed response to voice only. Noted with moaning and facial grimacing early in the shift-prn pain medication given with relief noted. Remains NPO as per MD order. Skin care rendered. Noted with amber colored urine. Daughter at bedside. Safety precautions maintained.  Problem: Education: Goal: Knowledge of General Education information will improve Description: Including pain rating scale, medication(s)/side effects and non-pharmacologic comfort measures Outcome: Progressing   Problem: Activity: Goal: Risk for activity intolerance will decrease Outcome: Progressing   Problem: Coping: Goal: Level of anxiety will decrease Outcome: Progressing

## 2023-08-25 NOTE — Assessment & Plan Note (Signed)
-   s/p bendamustine on 11/25; nadir not til week 3 so might fall further or other etiology for drop currently - ANC ~[phone] currently; no diff today - will place on neutropenic precautions and trend for now; currently afebrile; if develops a fever and depending on goals of care, would need to initiate empiric coverage

## 2023-08-25 NOTE — Progress Notes (Signed)
   08/25/23 2049  Assess: MEWS Score  Temp 99.4 F (37.4 C)  BP 125/72  MAP (mmHg) 88  Pulse Rate 99  Resp 16  Level of Consciousness Responds to Pain  SpO2 95 %  O2 Device Nasal Cannula  O2 Flow Rate (L/min) 1 L/min  Assess: MEWS Score  MEWS Temp 0  MEWS Systolic 0  MEWS Pulse 0  MEWS RR 0  MEWS LOC 2  MEWS Score 2  MEWS Score Color Yellow  Assess: if the MEWS score is Yellow or Red  Were vital signs accurate and taken at a resting state? Yes  Does the patient meet 2 or more of the SIRS criteria? No  MEWS guidelines implemented  Yes, yellow  Treat  MEWS Interventions Considered administering scheduled or prn medications/treatments as ordered  Take Vital Signs  Increase Vital Sign Frequency  Yellow: Q2hr x1, continue Q4hrs until patient remains green for 12hrs  Escalate  MEWS: Escalate Yellow: Discuss with charge nurse and consider notifying provider and/or RRT  Notify: Charge Nurse/RN  Name of Charge Nurse/RN Notified Tom RN  Provider Notification  Provider Name/Title Garner Nash NP  Date Provider Notified 08/25/23  Time Provider Notified 2116  Method of Notification Page  Notification Reason Other (Comment) (yellow mews, lethargy)  Provider response No new orders  Assess: SIRS CRITERIA  SIRS Temperature  0  SIRS Pulse 1  SIRS Respirations  0  SIRS WBC 0  SIRS Score Sum  1

## 2023-08-25 NOTE — Assessment & Plan Note (Signed)
-   Agree with palliative care consultation.  Prognosis certainly guarded and if does not improve with above measures, would be appropriate for more consideration of hospice - May need further input from oncology regarding chemo regimen and treatment options to help family with decision making

## 2023-08-25 NOTE — Plan of Care (Signed)

## 2023-08-25 NOTE — Assessment & Plan Note (Signed)
-   Multifactorial as noted above - Daughter states patient wishes are to return home if she were approaching end-of-life.  Holding off on evals for now

## 2023-08-25 NOTE — [facility] Group Notes (Signed)
Chaplain attempted to meet with Cynthia Velasquez and her family to provide support.  When chaplain arrived, Cynthia Velasquez was resting and her daughter was not in the room.  Chaplain will attempt follow up tomorrow, but please also page if needs arise.  [address] Bcc Pager, [phone]

## 2023-08-25 NOTE — Assessment & Plan Note (Addendum)
-   Corrected calcium 15 today; given her generalized pain complaints, lethargy, confusion, will go ahead and treat calcium level for now - Calcitonin and zoledronic acid ordered; unable to give Lasix in setting of renal function - Continue fluids - Trend CMP

## 2023-08-25 NOTE — Consult Note (Signed)
Referring Provider: Dr. Frederick Peers Primary Care Physician:  [name], MD Primary Gastroenterologist:  Gentry Fitz  Reason for Consultation:  Elevated LFTs  HPI: Cynthia Velasquez is a 72 y.o. female with multiple medical problems seen for consultation due to elevated LFTs in the setting of chemotherapy (breast cancer and lymphoma) started late last month at [facility]. Chart reviewed and reports confusion and worsening weakness. Patient somnolent and slow to arouse but not giving any history or responding to simple commands. MRCP negative for biliary obstruction.  Past Medical History:  Diagnosis Date   Anal cancer (HCC) dx'd 05/2019   Anxiety    Arthritis    Bilateral breast cancer (HCC) 10/21/2013   Breast cancer (HCC)    Cancer (HCC)    Depression    Gallstones    GERD (gastroesophageal reflux disease)    doesn't take any meds for this   Headache    h/o migraines       History of bladder infections    History of hiatal hernia    History of migraine    Hyperlipidemia    takes Simvastatin daily   Hypertension    takes Hyzaar   Joint pain    Joint swelling    Lumbar stenosis    Lymphoma (HCC)    NHL   Neuropathy 09/07/2013   Pneumonia    Pre-diabetes     Past Surgical History:  Procedure Laterality Date   ABDOMINAL HYSTERECTOMY     bone spur removed from left foot     CHOLECYSTECTOMY     COLONOSCOPY     COLONOSCOPY WITH PROPOFOL Bilateral 12/07/2021   Procedure: COLONOSCOPY WITH PROPOFOL;  Surgeon: Vida Rigger, MD;  Location: [facility] ENDOSCOPY;  Service: Endoscopy;  Laterality: Bilateral;   double mastectomy      ESOPHAGOGASTRODUODENOSCOPY N/A 12/07/2021   Procedure: ESOPHAGOGASTRODUODENOSCOPY (EGD);  Surgeon: Vida Rigger, MD;  Location: [facility];  Service: Endoscopy;  Laterality: N/A;   HERNIA REPAIR     umbilical   IR IMAGING GUIDED PORT INSERTION  06/25/2022   right knee arthroscopy     right shoulder arthroscopy     surgery for hiatal hernia     TONSILLECTOMY     TOTAL  HIP ARTHROPLASTY Left 02/12/2013   TOTAL HIP ARTHROPLASTY Left 02/12/2013   Procedure: LEFT TOTAL HIP ARTHROPLASTY;  Surgeon: Nestor Lewandowsky, MD;  Location: [facility] OR;  Service: Orthopedics;  Laterality: Left;   TOTAL HIP ARTHROPLASTY Right 05/03/2013   Procedure: TOTAL HIP ARTHROPLASTY;  Surgeon: Nestor Lewandowsky, MD;  Location: [facility] OR;  Service: Orthopedics;  Laterality: Right;   TOTAL KNEE ARTHROPLASTY Right 02/27/2015   Procedure: TOTAL KNEE ARTHROPLASTY;  Surgeon: Gean Birchwood, MD;  Location: [facility] OR;  Service: Orthopedics;  Laterality: Right;   TOTAL SHOULDER ARTHROPLASTY Right 09/12/2016   Procedure: RIGHT TOTAL SHOULDER ARTHROPLASTY;  Surgeon: Jones Broom, MD;  Location: [facility] OR;  Service: Orthopedics;  Laterality: Right;  RIGHT TOTAL SHOULDER ARTHROPLASTY    Prior to Admission medications   Medication Sig Start Date End Date Taking? Authorizing Provider  acyclovir (ZOVIRAX) 400 MG tablet Take 1 tablet (400 mg total) by mouth daily. 08/02/23  Yes [name], MD  amLODipine (NORVASC) 5 MG tablet Take 1 tablet (5 mg total) by mouth daily. 08/03/23  Yes Regalado, Belkys A, MD  lidocaine-prilocaine (EMLA) cream Apply to affected area once Patient taking differently: Apply 1 Application topically once. Apply to affected area once 08/02/23  Yes [name], MD  losartan (COZAAR) 100 MG tablet  Take 100 mg by mouth daily.   Yes [provider]  Magnesium 200 MG TABS Take 2 tablets (400 mg total) by mouth 2 (two) times daily. 08/03/23  Yes Regalado, Belkys A, MD  methocarbamol (ROBAXIN) 750 MG tablet Take 1 tablet (750 mg total) by mouth every 8 (eight) hours as needed for muscle spasms. 01/08/23  Yes [name], MD  ondansetron (ZOFRAN) 8 MG tablet Take 1 tablet (8 mg total) by mouth every 8 (eight) hours as needed for nausea or vomiting. Start on the third day after chemotherapy. 08/02/23  Yes [name], MD  pantoprazole (PROTONIX) 40 MG tablet Take 1 tablet  (40 mg total) by mouth 2 (two) times daily. 08/03/23  Yes Regalado, Belkys A, MD  polyethylene glycol (MIRALAX / GLYCOLAX) 17 g packet Take 17 g by mouth daily. 08/03/23  Yes Regalado, Belkys A, MD  prochlorperazine (COMPAZINE) 10 MG tablet Take 1 tablet (10 mg total) by mouth every 6 (six) hours as needed for nausea or vomiting. 08/02/23  Yes [name], MD  senna-docusate (SENOKOT-S) 8.6-50 MG tablet Take 1 tablet by mouth 2 (two) times daily. 08/03/23  Yes Regalado, Belkys A, MD  sucralfate (CARAFATE) 1 g tablet Dissolve 1 tablet in 5-10ml of water twice a day and then swallow slurry. 06/27/23  Yes [name], MD  triamcinolone ointment (KENALOG) 0.5 % Apply 1 Application topically 2 (two) times daily. To rash under both armpits Patient taking differently: Apply 1 Application topically 2 (two) times daily as needed (Rash). To rash under both armpits 05/20/23  Yes Kale, Corene Cornea, MD  dexamethasone (DECADRON) 4 MG tablet Take 2 tablets (8 mg total) by mouth daily. Start the day after bendamustine chemotherapy for 2 days. Take with food. Patient not taking: Reported on 08/24/2023 08/02/23   [name], MD  magic mouthwash (multi-ingredient) oral suspension Take 5 mLs by mouth 3 (three) times daily as needed. Patient not taking: Reported on 08/24/2023 06/27/23   [name], MD  potassium chloride SA (KLOR-CON M) 20 MEQ tablet Take 2 tablets (40 mEq total) by mouth daily for 10 days. Patient not taking: Reported on 08/24/2023 08/03/23 08/13/23  Hartley Barefoot A, MD    Scheduled Meds:  calcitonin  4 Units/kg Subcutaneous BID   Chlorhexidine Gluconate Cloth  6 each Topical Daily   methylPREDNISolone (SOLU-MEDROL) injection  125 mg Intravenous BID   pantoprazole (PROTONIX) IV  40 mg Intravenous Daily   Continuous Infusions:  dextrose 5 % and 0.2 % NaCl 100 mL/hr at 08/25/23 0903   PRN Meds:.albuterol, hydrALAZINE, HYDROmorphone (DILAUDID) injection,  ondansetron **OR** ondansetron (ZOFRAN) IV, oxyCODONE, sodium chloride flush, traZODone  Allergies as of 08/24/2023 - Review Complete 08/24/2023  Allergen Reaction Noted   Tramadol Itching and Rash 06/10/2022   Belinostat Nausea And Vomiting 02/24/2023   Codeine Itching and Rash 02/05/2013   Vicodin [hydrocodone-acetaminophen] Itching and Rash 06/29/2012    Family History  Adopted: Yes  Problem Relation Age of Onset   Allergic rhinitis Neg Hx    Angioedema Neg Hx    Atopy Neg Hx    Eczema Neg Hx    Immunodeficiency Neg Hx    Urticaria Neg Hx    Asthma Neg Hx     Social History   Socioeconomic History   Marital status: Single    Spouse name: Not on file   Number of children: 1   Years of education: Not on file   Highest education level: Not  on file  Occupational History   Not on file  Tobacco Use   Smoking status: Never   Smokeless tobacco: Never  Vaping Use   Vaping status: Never Used  Substance and Sexual Activity   Alcohol use: No    Comment: occ   Drug use: No   Sexual activity: Not Currently  Other Topics Concern   Not on file  Social History Narrative   Are you right handed or left handed? Right   Are you currently employed ? no   What is your current occupation?retired   Do you live at home alone? no   Who lives with you? Daughter and granddaughter   What type of home do you live in: 1 story or 2 story? one   Caffeine 1 cup a day   Social Determinants of Health   Financial Resource Strain: Not on file  Food Insecurity: No Food Insecurity (08/24/2023)   Hunger Vital Sign    Worried About Running Out of Food in the Last Year: Never true    Ran Out of Food in the Last Year: Never true  Transportation Needs: No Transportation Needs (08/24/2023)   PRAPARE - Administrator, Civil Service (Medical): No    Lack of Transportation (Non-Medical): No  Physical Activity: Not on file  Stress: Not on file  Social Connections: Unknown (01/29/2022)    [facility] from [facility]   Social Network    Social Network: Not on file  Intimate Partner Violence: Not At Risk (08/24/2023)   Humiliation, Afraid, Rape, and Kick questionnaire    Fear of Current or Ex-Partner: No    Emotionally Abused: No    Physically Abused: No    Sexually Abused: No    Review of Systems: All negative except as stated above in HPI.  Physical Exam: Vital signs: Vitals:   08/25/23 0535 08/25/23 0845  BP: 122/75 119/81  Pulse: (!) 106 98  Resp: 16   Temp: 98.2 F (36.8 C)   SpO2: 96% 95%   Last BM Date : 08/24/23 General:  Somnolent, elderly, well-nourished, no acute distress  Head: normocephalic, atraumatic Eyes: +icteric sclera ENT: oropharynx clear Neck: supple, nontender Lungs:  Clear throughout to auscultation.   No wheezes, crackles, or rhonchi. No acute distress. Heart:  Regular rate and rhythm; no murmurs, clicks, rubs,  or gallops. Abdomen: soft, nontender, nondistended, +BS  Rectal:  Deferred Ext: no edema  GI:  Lab Results: Recent Labs    08/24/23 0835 08/25/23 0025  WBC 1.5* 1.5*  HGB 13.2 11.3*  HCT 41.1 35.0*  PLT 43* 35*   BMET Recent Labs    08/24/23 0835 08/25/23 0025  NA 137 143  K 3.5 3.6  CL 102 109  CO2 23 20*  GLUCOSE 94 63*  BUN 41* 49*  CREATININE 1.60* 2.03*  CALCIUM 14.3* 13.8*   LFT Recent Labs    08/25/23 0025  PROT 4.8*  ALBUMIN 2.5*  AST 446*  ALT 150*  ALKPHOS 424*  BILITOT 8.7*   PT/INR Recent Labs    08/24/23 0835  LABPROT 14.6  INR 1.1     Studies/Results: MR 3D Recon At Scanner  Result Date: 08/25/2023 CLINICAL DATA:  Jaundice, T-cell lymphoma EXAM: MRI ABDOMEN WITHOUT CONTRAST  (INCLUDING MRCP) TECHNIQUE: Multiplanar multisequence MR imaging of the abdomen was performed. Heavily T2-weighted images of the biliary and pancreatic ducts were obtained, and three-dimensional MRCP images were rendered by post processing. COMPARISON:  CT abdomen/pelvis dated 08/24/2023.  PET-CT dated 08/07/2023. FINDINGS: Motion degraded images. Lower chest: Trace bilateral pleural effusions. Hepatobiliary: Liver is grossly unremarkable. No focal hepatic lesion is seen. Status post cholecystectomy. No intrahepatic or extrahepatic dilatation. Pancreas:  Within normal limits. Spleen:  Within normal limits. Adrenals/Urinary Tract:  Adrenal glands are within normal limits. Bilateral renal cysts, measuring up to 16 mm in the right upper kidney (series 4/image 27), benign (Bosniak I). No follow-up is recommended. No hydronephrosis. Stomach/Bowel: Stomach is within normal limits. Visualized bowel is unremarkable. Vascular/Lymphatic:  No evidence of abdominal aortic aneurysm. Upper abdominal and retroperitoneal lymphadenopathy, including a dominant 2.4 cm short axis left periaortic nodal mass (series 4/image 41). Other:  Trace perihepatic and perisplenic ascites. Musculoskeletal: No focal osseous lesions.  Body wall edema. IMPRESSION: Motion degraded images. Status post cholecystectomy. No intrahepatic or extrahepatic biliary dilatation. Upper abdominal and retroperitoneal lymphadenopathy, corresponding to the patient's known lymphoma, better evaluated on recent CT and PET. Trace bilateral pleural effusions. Trace upper abdominal ascites. Body wall edema. Electronically Signed   By: Charline Bills M.D.   On: 08/25/2023 01:15   MR ABDOMEN MRCP WO CONTRAST  Result Date: 08/25/2023 CLINICAL DATA:  Jaundice, T-cell lymphoma EXAM: MRI ABDOMEN WITHOUT CONTRAST  (INCLUDING MRCP) TECHNIQUE: Multiplanar multisequence MR imaging of the abdomen was performed. Heavily T2-weighted images of the biliary and pancreatic ducts were obtained, and three-dimensional MRCP images were rendered by post processing. COMPARISON:  CT abdomen/pelvis dated 08/24/2023. PET-CT dated 08/07/2023. FINDINGS: Motion degraded images. Lower chest: Trace bilateral pleural effusions. Hepatobiliary: Liver is grossly unremarkable. No focal  hepatic lesion is seen. Status post cholecystectomy. No intrahepatic or extrahepatic dilatation. Pancreas:  Within normal limits. Spleen:  Within normal limits. Adrenals/Urinary Tract:  Adrenal glands are within normal limits. Bilateral renal cysts, measuring up to 16 mm in the right upper kidney (series 4/image 27), benign (Bosniak I). No follow-up is recommended. No hydronephrosis. Stomach/Bowel: Stomach is within normal limits. Visualized bowel is unremarkable. Vascular/Lymphatic:  No evidence of abdominal aortic aneurysm. Upper abdominal and retroperitoneal lymphadenopathy, including a dominant 2.4 cm short axis left periaortic nodal mass (series 4/image 41). Other:  Trace perihepatic and perisplenic ascites. Musculoskeletal: No focal osseous lesions.  Body wall edema. IMPRESSION: Motion degraded images. Status post cholecystectomy. No intrahepatic or extrahepatic biliary dilatation. Upper abdominal and retroperitoneal lymphadenopathy, corresponding to the patient's known lymphoma, better evaluated on recent CT and PET. Trace bilateral pleural effusions. Trace upper abdominal ascites. Body wall edema. Electronically Signed   By: Charline Bills M.D.   On: 08/25/2023 01:15   CT Angio Chest PE W and/or Wo Contrast  Result Date: 08/24/2023 CLINICAL DATA:  Increasing weakness since Thursday, history of lymphoma EXAM: CT ANGIOGRAPHY CHEST CT ABDOMEN AND PELVIS WITH CONTRAST TECHNIQUE: Multidetector CT imaging of the chest was performed using the standard protocol during bolus administration of intravenous contrast. Multiplanar CT image reconstructions and MIPs were obtained to evaluate the vascular anatomy. Multidetector CT imaging of the abdomen and pelvis was performed using the standard protocol during bolus administration of intravenous contrast. RADIATION DOSE REDUCTION: This exam was performed according to the departmental dose-optimization program which includes automated exposure control, adjustment of  the mA and/or kV according to patient size and/or use of iterative reconstruction technique. CONTRAST:  80mL OMNIPAQUE IOHEXOL 350 MG/ML SOLN COMPARISON:  08/07/2023, 07/31/2023, 05/09/2023 FINDINGS: CTA CHEST FINDINGS Cardiovascular: This is a technically adequate evaluation of the pulmonary vasculature. No filling defects or pulmonary emboli. The heart is unremarkable without pericardial effusion. Ectasia of the thoracic aorta without  evidence of aneurysm or dissection. Atherosclerosis of the aortic arch. Mediastinum/Nodes: Extensive lymphadenopathy is identified, similar in appearance to recent PET scan. Index lymph nodes are as follows: Right cervical chain, 2.9 cm, image 4/4. Left axilla, 1.2 cm short axis, image 50/4 AP window, 1.3 cm short axis, image 54/4. Left hilum, 1.6 cm short axis, image 42/4. Left internal mammary chain, 1.2 cm short axis, image 55/4. The lymphadenopathy has progressed since the 12/28/2022 chest CT, but is similar in appearance to recent PET scan 08/07/2023. Thyroid, trachea, and esophagus are unremarkable. Lungs/Pleura: There are 2 new left upper lobe pulmonary nodules. On image 20/12 there is an 8 mm nodule, which is metabolically active on recent PET scan. On image 55/12 there is a 7 mm nodule, with faint low level activity on recent PET scan. Findings are suspicious for metastatic disease. Hypoventilatory changes are seen at the lung bases. Curvilinear areas of subpleural consolidation at the lung apices may reflect atelectasis. No effusion or pneumothorax. Musculoskeletal: 9 mm subcutaneous nodule in the posterior left shoulder image 43/4 corresponds to a metabolically active lesion on PET scan, and is new since prior CT. No acute or destructive bony abnormalities. Severe degenerative changes of the left shoulder. Unremarkable right shoulder arthroplasty. Reconstructed images demonstrate no additional findings. Review of the MIP images confirms the above findings. CT ABDOMEN and  PELVIS FINDINGS Hepatobiliary: No focal liver abnormality is seen. Status post cholecystectomy. No biliary dilatation. Pancreas: Unremarkable. No pancreatic ductal dilatation or surrounding inflammatory changes. Spleen: Normal in size without focal abnormality. Adrenals/Urinary Tract: Stable appearance of the adrenals and kidneys. No urinary tract calculi or obstruction. Bladder is unremarkable. Stomach/Bowel: No bowel obstruction or ileus. Scattered colonic diverticulosis without diverticulitis. No bowel wall thickening or inflammatory change. Vascular/Lymphatic: Persistent lymphadenopathy throughout the abdomen and pelvis. Index lymph nodes are as follows: Right retrocrural, image 24/2, 12 mm.  Previously 16 mm. Porta hepatis, image 33/2, 13 mm.  No change. Retroperitoneal lymph node mass, image 50/2, 78 x 30 mm, previously 89 x 31 mm. Central mesenteric, image 59/2, 9 mm.  Previously 14 mm. Right inguinal, image 90/2, 11 mm.  Previously 15 mm. Stable aortic atherosclerosis. There is extrinsic compression upon the IVC and bilateral common iliac veins due to the retroperitoneal lymph node mass, stable since prior study. Reproductive: Status post hysterectomy. No adnexal masses. Other: Trace free fluid within the paracolic gutters. No free intraperitoneal gas. Small fat containing supraumbilical ventral hernias are stable. Musculoskeletal: No acute or destructive bony abnormalities. Reconstructed images demonstrate no additional findings. Review of the MIP images confirms the above findings. IMPRESSION: Chest: 1. No evidence of pulmonary embolus. 2. Extensive lymphadenopathy within the neck, axillary regions, mediastinum, and hila. Similar appearance to recent PET scan 08/07/2023, with progression since prior chest CT 12/28/2022. 3. New left upper lobe pulmonary nodule since the prior chest CT, corresponding to metabolically active lesions on recent PET scan, consistent with metastatic disease. 4.  Aortic  Atherosclerosis (ICD10-I70.0). Abdomen/pelvis: 1. Diffuse lymphadenopathy throughout the abdomen and pelvis, minimally decreased in size since the abdominal CT 07/31/2023. No new adenopathy is identified. 2. Trace free fluid within the bilateral paracolic gutters, nonspecific. 3. No other acute intra-abdominal or intrapelvic process. Electronically Signed   By: Sharlet Salina M.D.   On: 08/24/2023 11:06   CT ABDOMEN PELVIS W CONTRAST  Result Date: 08/24/2023 CLINICAL DATA:  Increasing weakness since Thursday, history of lymphoma EXAM: CT ANGIOGRAPHY CHEST CT ABDOMEN AND PELVIS WITH CONTRAST TECHNIQUE: Multidetector CT imaging of the chest  was performed using the standard protocol during bolus administration of intravenous contrast. Multiplanar CT image reconstructions and MIPs were obtained to evaluate the vascular anatomy. Multidetector CT imaging of the abdomen and pelvis was performed using the standard protocol during bolus administration of intravenous contrast. RADIATION DOSE REDUCTION: This exam was performed according to the departmental dose-optimization program which includes automated exposure control, adjustment of the mA and/or kV according to patient size and/or use of iterative reconstruction technique. CONTRAST:  80mL OMNIPAQUE IOHEXOL 350 MG/ML SOLN COMPARISON:  08/07/2023, 07/31/2023, 05/09/2023 FINDINGS: CTA CHEST FINDINGS Cardiovascular: This is a technically adequate evaluation of the pulmonary vasculature. No filling defects or pulmonary emboli. The heart is unremarkable without pericardial effusion. Ectasia of the thoracic aorta without evidence of aneurysm or dissection. Atherosclerosis of the aortic arch. Mediastinum/Nodes: Extensive lymphadenopathy is identified, similar in appearance to recent PET scan. Index lymph nodes are as follows: Right cervical chain, 2.9 cm, image 4/4. Left axilla, 1.2 cm short axis, image 50/4 AP window, 1.3 cm short axis, image 54/4. Left hilum, 1.6 cm short  axis, image 42/4. Left internal mammary chain, 1.2 cm short axis, image 55/4. The lymphadenopathy has progressed since the 12/28/2022 chest CT, but is similar in appearance to recent PET scan 08/07/2023. Thyroid, trachea, and esophagus are unremarkable. Lungs/Pleura: There are 2 new left upper lobe pulmonary nodules. On image 20/12 there is an 8 mm nodule, which is metabolically active on recent PET scan. On image 55/12 there is a 7 mm nodule, with faint low level activity on recent PET scan. Findings are suspicious for metastatic disease. Hypoventilatory changes are seen at the lung bases. Curvilinear areas of subpleural consolidation at the lung apices may reflect atelectasis. No effusion or pneumothorax. Musculoskeletal: 9 mm subcutaneous nodule in the posterior left shoulder image 43/4 corresponds to a metabolically active lesion on PET scan, and is new since prior CT. No acute or destructive bony abnormalities. Severe degenerative changes of the left shoulder. Unremarkable right shoulder arthroplasty. Reconstructed images demonstrate no additional findings. Review of the MIP images confirms the above findings. CT ABDOMEN and PELVIS FINDINGS Hepatobiliary: No focal liver abnormality is seen. Status post cholecystectomy. No biliary dilatation. Pancreas: Unremarkable. No pancreatic ductal dilatation or surrounding inflammatory changes. Spleen: Normal in size without focal abnormality. Adrenals/Urinary Tract: Stable appearance of the adrenals and kidneys. No urinary tract calculi or obstruction. Bladder is unremarkable. Stomach/Bowel: No bowel obstruction or ileus. Scattered colonic diverticulosis without diverticulitis. No bowel wall thickening or inflammatory change. Vascular/Lymphatic: Persistent lymphadenopathy throughout the abdomen and pelvis. Index lymph nodes are as follows: Right retrocrural, image 24/2, 12 mm.  Previously 16 mm. Porta hepatis, image 33/2, 13 mm.  No change. Retroperitoneal lymph node  mass, image 50/2, 78 x 30 mm, previously 89 x 31 mm. Central mesenteric, image 59/2, 9 mm.  Previously 14 mm. Right inguinal, image 90/2, 11 mm.  Previously 15 mm. Stable aortic atherosclerosis. There is extrinsic compression upon the IVC and bilateral common iliac veins due to the retroperitoneal lymph node mass, stable since prior study. Reproductive: Status post hysterectomy. No adnexal masses. Other: Trace free fluid within the paracolic gutters. No free intraperitoneal gas. Small fat containing supraumbilical ventral hernias are stable. Musculoskeletal: No acute or destructive bony abnormalities. Reconstructed images demonstrate no additional findings. Review of the MIP images confirms the above findings. IMPRESSION: Chest: 1. No evidence of pulmonary embolus. 2. Extensive lymphadenopathy within the neck, axillary regions, mediastinum, and hila. Similar appearance to recent PET scan 08/07/2023, with progression since prior  chest CT 12/28/2022. 3. New left upper lobe pulmonary nodule since the prior chest CT, corresponding to metabolically active lesions on recent PET scan, consistent with metastatic disease. 4.  Aortic Atherosclerosis (ICD10-I70.0). Abdomen/pelvis: 1. Diffuse lymphadenopathy throughout the abdomen and pelvis, minimally decreased in size since the abdominal CT 07/31/2023. No new adenopathy is identified. 2. Trace free fluid within the bilateral paracolic gutters, nonspecific. 3. No other acute intra-abdominal or intrapelvic process. Electronically Signed   By: Sharlet Salina M.D.   On: 08/24/2023 11:06   DG Chest Portable 1 View  Result Date: 08/24/2023 CLINICAL DATA:  72 year old female with weakness.  Lymphoma. EXAM: PORTABLE CHEST 1 VIEW COMPARISON:  Portable chest 03/28/2023.  PET-CT 08/07/2023. FINDINGS: Portable AP semi upright view at 0847 hours. Right chest power port remains. Increased mediastinal contour compatible with mediastinal lymphadenopathy in this setting. Visualized  tracheal air column is within normal limits. Low lung volumes. But Allowing for portable technique the lungs are clear. No pneumothorax or pleural effusion. Paucity bowel gas. No acute osseous abnormality identified. Chronic chest wall surgical clips. Chronic right shoulder arthroplasty. IMPRESSION: 1. Evidence of progressed mediastinal lymphadenopathy since July. 2. Low lung volumes, otherwise no acute cardiopulmonary abnormality. Electronically Signed   By: Odessa Fleming M.D.   On: 08/24/2023 09:00   CT Head Wo Contrast  Result Date: 08/24/2023 CLINICAL DATA:  72 year old female with altered mental status. Increasing weakness. Lymphoma. EXAM: CT HEAD WITHOUT CONTRAST TECHNIQUE: Contiguous axial images were obtained from the base of the skull through the vertex without intravenous contrast. RADIATION DOSE REDUCTION: This exam was performed according to the departmental dose-optimization program which includes automated exposure control, adjustment of the mA and/or kV according to patient size and/or use of iterative reconstruction technique. COMPARISON:  Brain MRI 10/27/2021.  Head CT 03/28/2023. FINDINGS: Brain: Cerebral volume remains normal for age. No midline shift, ventriculomegaly, mass effect, evidence of mass lesion, intracranial hemorrhage or evidence of cortically based acute infarction. Gray-white differentiation stable and within normal limits for age. Vascular: No suspicious intracranial vascular hyperdensity. Skull: Bilateral TMJ degeneration. No acute or suspicious osseous lesion. Sinuses/Orbits: Visualized paranasal sinuses and mastoids are stable and well aerated. Other: Visualized orbits and scalp soft tissues are within normal limits. IMPRESSION: Stable and normal for age noncontrast CT appearance of the Brain. Electronically Signed   By: Odessa Fleming M.D.   On: 08/24/2023 08:56    Impression/Plan: Elevated LFTs likely due to recent chemotherapy. No signs of biliary obstruction. Suspect medication  source for elevated LFTs. Continue supportive care. Follow LFTs closely.    LOS: 0 days   Shirley Friar  08/25/2023, 4:06 PM  Questions please call [phone]

## 2023-08-25 NOTE — Assessment & Plan Note (Signed)
-   High percentage of elevated AST, ALT, and TB from Bendamustine per up-to-date - MRCP negative for obstruction or ductal dilatation - Defer further workup to GI

## 2023-08-25 NOTE — Assessment & Plan Note (Signed)
-   Did have normal renal function back in June 2024 but starting July 2024 she had AKI that does not appear to have ever fully recovered and creatinine has been hovering around the 1.6 range with GFR in the low 30s -Creatinine slightly up from that on admission which has further up trended up again today - Given reported history of poor intake, suspecting some volume depletion for now - Continue fluids - Trend CMP

## 2023-08-25 NOTE — Progress Notes (Signed)
Progress Note    Cynthia Velasquez   ZOX:[id number]  DOB: 14-Apr-1951  DOA: 08/24/2023     0 PCP: [name], MD  Initial CC: Lethargy, weakness, confusion  Hospital Course: Ms. Burford is a 72 year old female with PMH multiple cancer history (breast cancer, anal cancer, most recently lymphoma), HTN, HLD, depression/anxiety, GERD. She presented with worsening lethargy and confusion with associated weakness.  Typically she can ambulate independently at home and had slowly become more dependent on daughter for assistance.  Patient received chemotherapy 08/11/2023 of Bendamustine under the care of Dr. Candise Che after consultation as well with Dr. Alphonsa Gin at [facility] who also concurred with this recommended treatment.  Patient's daughter who is at the bedside states that she was doing well after her chemotherapy at the end of November, seemed to have good energy, was still driving herself and even going to work.  However over the last 5 days, she has had progressive weakness, has not been eating or drinking very much, has had worsening abdominal pain, and difficulty ambulating.  The morning of admission she was unable to get out of her bed due to worsened weakness.  On workup she was found to have hypercalcemia, worsened renal function, and elevated LFTs. She underwent multiple imaging studies.  CTA chest negative for PE and showed extensive lymphadenopathy in the neck, axilla, mediastinum, and hila similar from recent PET scan. Abdominal imaging showed diffuse lymphadenopathy throughout the abdomen and pelvis. MRCP was then performed which showed no biliary dilatation and no signs of choledocholithiasis.  She has had a prior cholecystectomy. Palliative care and GI were consulted on admission.  Interval History:  Daughter present bedside this morning.  Patient resting in bed appearing mildly uncomfortable and tossing some.  Complains of discomfort in her abdomen and says a few words at a time when  answering questions.  She is able to move all 4 extremities to command.  Assessment and Plan: * Acute metabolic encephalopathy - Patient presented with progressively worsening confusion, lethargy, weakness - Suspect etiology is multifactorial in setting of underlying hypercalcemia, AKI, lymphoma, and failure to thrive.  She may have not tolerated Bendamustine, given on 08/11/23 and/or had reaction to with elevated LFTs -For now treating hypercalcemia and renal function as able and will monitor her mentation response  Hypercalcemia - Corrected calcium 15 today; given her generalized pain complaints, lethargy, confusion, will go ahead and treat calcium level for now - Calcitonin and zoledronic acid ordered; unable to give Lasix in setting of renal function - Continue fluids - Trend CMP  Neutropenia (HCC) - s/p bendamustine on 11/25; nadir not til week 3 so might fall further or other etiology for drop currently - ANC ~[phone] currently; no diff today - will place on neutropenic precautions and trend for now; currently afebrile; if develops a fever and depending on goals of care, would need to initiate empiric coverage  Elevated liver enzymes - High percentage of elevated AST, ALT, and TB from Bendamustine per up-to-date - MRCP negative for obstruction or ductal dilatation - Defer further workup to GI  AKI (acute kidney injury) (HCC) - Did have normal renal function back in June 2024 but starting July 2024 she had AKI that does not appear to have ever fully recovered and creatinine has been hovering around the 1.6 range with GFR in the low 30s -Creatinine slightly up from that on admission which has further up trended to 2.03 today - Given reported history of poor intake, suspecting some volume depletion for now -  Continue fluids - Trend CMP  Goals of care, counseling/discussion - Agree with palliative care consultation.  Prognosis certainly guarded and if does not improve with above  measures, would be appropriate for more consideration of hospice - May need further input from oncology regarding chemo regimen and treatment options to help family with decision making  Generalized weakness - Multifactorial as noted above - Daughter states patient wishes are to return home if she were approaching end-of-life.  Holding off on evals for now   Old records reviewed in assessment of this patient  Antimicrobials:   DVT prophylaxis:  SCDs Start: 08/24/23 1205   Code Status:   Code Status: Full Code  Mobility Assessment (Last 72 Hours)     Mobility Assessment     Row Name 08/25/23 0845 08/24/23 2258 08/24/23 1330       Does patient have an order for bedrest or is patient medically unstable No - Continue assessment No - Continue assessment No - Continue assessment     What is the highest level of mobility based on the progressive mobility assessment? Level 1 (Bedfast) - Unable to balance while sitting on edge of bed Level 2 (Chairfast) - Balance while sitting on edge of bed and cannot stand Level 1 (Bedfast) - Unable to balance while sitting on edge of bed     Is the above level different from baseline mobility prior to current illness? Yes - Recommend PT order Yes - Recommend PT order No - Consider discontinuing PT/OT              Barriers to discharge: none Disposition Plan: Home Status is: Inpatient  Objective: Blood pressure 119/81, pulse 98, temperature 98.2 F (36.8 C), resp. rate 16, height 5\' 5"  (1.651 m), weight 80.8 kg, SpO2 95%.  Examination:  Physical Exam Constitutional:      Comments: Adult woman laying in bed difficult to arouse but is able to say a few words and follows commands and moves all 4 extremities  HENT:     Head: Normocephalic and atraumatic.     Mouth/Throat:     Mouth: Mucous membranes are dry.  Eyes:     General: Scleral icterus present.     Extraocular Movements: Extraocular movements intact.  Cardiovascular:     Rate and  Rhythm: Normal rate and regular rhythm.  Pulmonary:     Effort: Pulmonary effort is normal. No respiratory distress.     Breath sounds: Normal breath sounds. No wheezing.  Abdominal:     General: Bowel sounds are normal.     Palpations: Abdomen is soft.     Tenderness: There is abdominal tenderness (Generalized).  Musculoskeletal:        General: No swelling. Normal range of motion.     Cervical back: Normal range of motion and neck supple.  Skin:    General: Skin is warm and dry.  Neurological:     Comments: Grossly confused but follows commands and moves all 4 extremities      Consultants:  GI Palliative care Oncology  Procedures:    Data Reviewed: Results for orders placed or performed during the hospital encounter of 08/24/23 (from the past 24 hour(s))  Hepatitis panel, acute     Status: None   Collection Time: 08/24/23  2:21 PM  Result Value Ref Range   Hepatitis B Surface Ag NON REACTIVE NON REACTIVE   HCV Ab NON REACTIVE NON REACTIVE   Hep A IgM NON REACTIVE NON REACTIVE   Hep B  C IgM NON REACTIVE NON REACTIVE  MRSA Next Gen by PCR, Nasal     Status: None   Collection Time: 08/24/23  6:22 PM   Specimen: Nasal Mucosa; Nasal Swab  Result Value Ref Range   MRSA by PCR Next Gen NOT DETECTED NOT DETECTED  Comprehensive metabolic panel     Status: Abnormal   Collection Time: 08/25/23 12:25 AM  Result Value Ref Range   Sodium 143 135 - 145 mmol/L   Potassium 3.6 3.5 - 5.1 mmol/L   Chloride 109 98 - 111 mmol/L   CO2 20 (L) 22 - 32 mmol/L   Glucose, Bld 63 (L) 70 - 99 mg/dL   BUN 49 (H) 8 - 23 mg/dL   Creatinine, Ser 4.09 (H) 0.44 - 1.00 mg/dL   Calcium 81.1 (HH) 8.9 - 10.3 mg/dL   Total Protein 4.8 (L) 6.5 - 8.1 g/dL   Albumin 2.5 (L) 3.5 - 5.0 g/dL   AST 914 (H) 15 - 41 U/L   ALT 150 (H) 0 - 44 U/L   Alkaline Phosphatase 424 (H) 38 - 126 U/L   Total Bilirubin 8.7 (H) <1.2 mg/dL   GFR, Estimated 26 (L) >60 mL/min   Anion gap 14 5 - 15  CBC     Status:  Abnormal   Collection Time: 08/25/23 12:25 AM  Result Value Ref Range   WBC 1.5 (L) 4.0 - 10.5 K/uL   RBC 3.99 3.87 - 5.11 MIL/uL   Hemoglobin 11.3 (L) 12.0 - 15.0 g/dL   HCT 78.2 (L) 95.6 - 21.3 %   MCV 87.7 80.0 - 100.0 fL   MCH 28.3 26.0 - 34.0 pg   MCHC 32.3 30.0 - 36.0 g/dL   RDW 08.6 (H) 57.8 - 46.9 %   Platelets 35 (L) 150 - 400 K/uL   nRBC 4.1 (H) 0.0 - 0.2 %  Glucose, capillary     Status: Abnormal   Collection Time: 08/25/23  1:26 AM  Result Value Ref Range   Glucose-Capillary 65 (L) 70 - 99 mg/dL  Glucose, capillary     Status: Abnormal   Collection Time: 08/25/23  2:05 AM  Result Value Ref Range   Glucose-Capillary 103 (H) 70 - 99 mg/dL  Glucose, capillary     Status: None   Collection Time: 08/25/23  7:15 AM  Result Value Ref Range   Glucose-Capillary 78 70 - 99 mg/dL  Glucose, capillary     Status: Abnormal   Collection Time: 08/25/23 11:59 AM  Result Value Ref Range   Glucose-Capillary 118 (H) 70 - 99 mg/dL    I have reviewed pertinent nursing notes, vitals, labs, and images as necessary. I have ordered labwork to follow up on as indicated.  I have reviewed the last notes from staff over past 24 hours. I have discussed patient's care plan and test results with nursing staff, CM/SW, and other staff as appropriate.  Time spent: Greater than 50% of the 55 minute visit was spent in counseling/coordination of care for the patient as laid out in the A&P.   LOS: 0 days   Lewie Chamber, MD [facility] 08/25/2023, 12:04 PM

## 2023-08-25 NOTE — Assessment & Plan Note (Signed)
-   Patient presented with progressively worsening confusion, lethargy, weakness - Suspect etiology is multifactorial in setting of underlying hypercalcemia, AKI, lymphoma, and failure to thrive.  She may have not tolerated Bendamustine, given on 08/11/23 and/or had reaction to with elevated LFTs -For now treating hypercalcemia and renal function as able and will monitor her mentation response

## 2023-08-25 NOTE — Hospital Course (Addendum)
  Brief Narrative:  72 year old with history of multiple cancers including breast cancer, anal cancer, recent lymphoma, HTN, HLD, depression, GERD admitted to the hospital for lethargy, confusion and weakness.  Last chemotherapy was on 11/25 but over the past week prior to admission she became progressively weak.  Upon admission noted to have pancytopenia, hypercalcemia, encephalopathy, transaminitis.  Patient has been seen by both GI oncology who are both recommending that at this time patient should be transition to hospice.  Palliative care team is following.  Multiple electrolyte abnormality requiring fluids and repletion.  Due to persistent thrombocytopenia, now on Granix, slowly tapering prednisone.   After extensive discussions with patient and family members, patient, oncology and palliative care team patient and family were agreeable to transition patient to hospice at home.   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  Patient has been through quite a bit with several treatments for her multiple different cancers.  Recommend hospice at home at this time.   Acute metabolic encephalopathy, resolved - Overall progression of her  underlying malignancy.  MRI of the brain is negative for any pathology at this time.   Hypercalcemia/hypernatremia, slightly worsening Hypokalemia - Stable for now. Taper steroids.   Pancytopenia/Neutropenia.  - Currently on Granix   Transaminitis Acute Pancreatitis.  - Multiple ongoing issues.  Likely secondary to her malignancy.  MRCP negative.  No further workup per GI   AKI on CKD stage IIIb - Baseline creatinine around 1.0.  Creatinine peaked at 2.05.     Essential hypertension -IV as needed   History of breast cancer status postmastectomy -In remission   Unfortunately given her advanced age, multiple comorbidities, poor and inconsistent oral intake she has very poor prognosis.  Highly recommend comfort care/hospice at this time.  Palliative care team is following.   PT/OT-SNF   DVT prophylaxis: SCDs Start: 08/24/23 1205 Code Status: Full Code Family Communication:   [name] at Bedside.  Status is: Inpatient Remains inpatient appropriate because: Will plan to transition patient home with hospice    Subjective: Seen at bedside required IV morphine for abdominal pain which helped her.   Examination:   General exam: Appears calm and comfortable . Weak and frail Respiratory system: Clear to auscultation. Respiratory effort normal. Cardiovascular system: S1 & S2 heard, RRR. No JVD, murmurs, rubs, gallops or clicks. No pedal edema. Gastrointestinal system: Abdomen is nondistended, soft and nontender. No organomegaly or masses felt. Normal bowel sounds heard. Central nervous system: Alert and oriented. No focal neurological deficits. Extremities: Symmetric 4 x 5 power. Skin: No rashes, lesions or ulcers Psychiatry: Judgement and insight appear normal. Mood & affect appropriate.

## 2023-08-25 NOTE — Progress Notes (Signed)
Date and time results received: 08/25/23 0110 (use smartphrase ".now" to insert current time)  Test: BMP Critical Value: Ca= 13.8  Name of Provider Notified: MD on call  Orders Received? Or Actions Taken?:  waiting for new order

## 2023-08-25 NOTE — Progress Notes (Signed)
Hypoglycemic Event  CBG: 65  Treatment: D50 25 mL (12.5 gm)  Symptoms: None  Follow-up CBG: Time:0205 CBG Result:103   Possible Reasons for Event: Inadequate meal intake  Comments/MD notified:Daniels NP notified.    Ronnald Collum RN

## 2023-08-26 DIAGNOSIS — N179 Acute kidney failure, unspecified: Secondary | ICD-10-CM | POA: Diagnosis not present

## 2023-08-26 DIAGNOSIS — R748 Abnormal levels of other serum enzymes: Secondary | ICD-10-CM | POA: Diagnosis not present

## 2023-08-26 DIAGNOSIS — G9341 Metabolic encephalopathy: Secondary | ICD-10-CM | POA: Diagnosis not present

## 2023-08-26 DIAGNOSIS — C8442 Peripheral T-cell lymphoma, not classified, intrathoracic lymph nodes: Secondary | ICD-10-CM

## 2023-08-26 LAB — CBC WITH DIFFERENTIAL/PLATELET
Abs Immature Granulocytes: 0.02 10*3/uL (ref 0.00–0.07)
Basophils Absolute: 0 10*3/uL (ref 0.0–0.1)
Basophils Relative: 1 %
Eosinophils Absolute: 0 10*3/uL (ref 0.0–0.5)
Eosinophils Relative: 0 %
HCT: 32.3 % — ABNORMAL LOW (ref 36.0–46.0)
Hemoglobin: 10.4 g/dL — ABNORMAL LOW (ref 12.0–15.0)
Immature Granulocytes: 1 %
Lymphocytes Relative: 28 %
Lymphs Abs: 0.4 10*3/uL — ABNORMAL LOW (ref 0.7–4.0)
MCH: 28.4 pg (ref 26.0–34.0)
MCHC: 32.2 g/dL (ref 30.0–36.0)
MCV: 88.3 fL (ref 80.0–100.0)
Monocytes Absolute: 0.1 10*3/uL (ref 0.1–1.0)
Monocytes Relative: 5 %
Neutro Abs: 1 10*3/uL — ABNORMAL LOW (ref 1.7–7.7)
Neutrophils Relative %: 65 %
Platelets: 29 10*3/uL — CL (ref 150–400)
RBC: 3.66 MIL/uL — ABNORMAL LOW (ref 3.87–5.11)
RDW: 26.8 % — ABNORMAL HIGH (ref 11.5–15.5)
WBC: 1.6 10*3/uL — ABNORMAL LOW (ref 4.0–10.5)
nRBC: 3.8 % — ABNORMAL HIGH (ref 0.0–0.2)

## 2023-08-26 LAB — COMPREHENSIVE METABOLIC PANEL
ALT: 146 U/L — ABNORMAL HIGH (ref 0–44)
AST: 408 U/L — ABNORMAL HIGH (ref 15–41)
Albumin: 2.3 g/dL — ABNORMAL LOW (ref 3.5–5.0)
Alkaline Phosphatase: 371 U/L — ABNORMAL HIGH (ref 38–126)
Anion gap: 9 (ref 5–15)
BUN: 57 mg/dL — ABNORMAL HIGH (ref 8–23)
CO2: 22 mmol/L (ref 22–32)
Calcium: 11.8 mg/dL — ABNORMAL HIGH (ref 8.9–10.3)
Chloride: 109 mmol/L (ref 98–111)
Creatinine, Ser: 2.37 mg/dL — ABNORMAL HIGH (ref 0.44–1.00)
GFR, Estimated: 21 mL/min — ABNORMAL LOW (ref >60)
Glucose, Bld: 211 mg/dL — ABNORMAL HIGH (ref 70–99)
Potassium: 3.7 mmol/L (ref 3.5–5.1)
Sodium: 140 mmol/L (ref 135–145)
Total Bilirubin: 6.7 mg/dL — ABNORMAL HIGH (ref <1.2)
Total Protein: 4.6 g/dL — ABNORMAL LOW (ref 6.5–8.1)

## 2023-08-26 LAB — GLUCOSE, CAPILLARY
Glucose-Capillary: 153 mg/dL — ABNORMAL HIGH (ref 70–99)
Glucose-Capillary: 156 mg/dL — ABNORMAL HIGH (ref 70–99)
Glucose-Capillary: 168 mg/dL — ABNORMAL HIGH (ref 70–99)
Glucose-Capillary: 170 mg/dL — ABNORMAL HIGH (ref 70–99)
Glucose-Capillary: 186 mg/dL — ABNORMAL HIGH (ref 70–99)
Glucose-Capillary: 201 mg/dL — ABNORMAL HIGH (ref 70–99)

## 2023-08-26 LAB — MAGNESIUM: Magnesium: 2 mg/dL (ref 1.7–2.4)

## 2023-08-26 MED ORDER — DEXTROSE-SODIUM CHLORIDE 5-0.2 % IV SOLN
INTRAVENOUS | Status: DC
Start: 2023-08-26 — End: 2023-08-27

## 2023-08-26 NOTE — Progress Notes (Signed)
Daily Progress Note   Patient Name: Cynthia Velasquez       Date: 08/26/2023 DOB: 29-Jul-1951  Age: 72 y.o. MRN#: [id number] Attending Physician: Lewie Chamber, MD Primary Care Physician: [name], MD Admit Date: 08/24/2023 Length of Stay: 1 day  Reason for Consultation/Follow-up: Establishing goals of care  Subjective:   CC: Patient appears confused and lethargic when seen today.  Spoke with patient's daughter at bedside.  Following up regarding complex medical decision making.  Subjective:  Reviewed EMR prior to presenting to bedside.   Presented to bedside to meet with patient.  Patient laying in bed, opens her eyes at times.  Daughter states that that patient's mental status is a little bit better however she continues to be unable to participate in complex medical decision making.   Spoke with patient's daughter, Cynthia Velasquez, at bedside. Introduced myself as a member of the palliative medicine team though she remembers speaking to colleague Dr. Patterson Hammersmith.   Daughter is tearful.  She has had extensive conversations with Dr. Candise Che from medical oncology in evening on 08-25-2023.  She states that she understands that the inevitable is going to happen.  She wishes for continuation of some medical measures as she does not want to think that we are giving up on her mother.  Additionally, there are several extended relatives were in [place] and might try to come over if they can.  Additionally, I discussed with her further about DNR/DNI, I discussed with her about hospice philosophy of care.   Objective:   Vital Signs:  BP (!) 143/94 (BP Location: Left Arm)   Pulse 76   Temp 98.1 F (36.7 C) (Oral)   Resp 17   Ht 5\' 5"  (1.651 m)   Wt 80.8 kg   SpO2 96%   BMI 29.65 kg/m   Physical Exam: General: In bed, lethargic, confused, opens eyes appears uncomfortable Cardiovascular: RRR Neuro: Lethargic, confused  Imaging: I personally reviewed recent imaging.   Assessment & Plan:    Assessment: Patient is a 72 year old female with a past medical history of breast cancer, anal cancer, and recent diagnosis of non-Hodgkin's lymphoma on chemotherapy, hypertension, and GERD who was admitted on 08/24/2023 for management of decreased oral intake and increased lethargy at home.  Patient had last received chemotherapy on 08/11/2023; under the care of oncologist Dr. Candise Che and coordination of care with [facility] oncology.  Patient's daughter arrived that patient had initially been doing well after chemotherapy though over the past 5 days has gotten progressively weaker, not been eating or drinking well, worsening abdominal pain, and worsening weakness.  Upon presentation to the ER, lab work showing evidence of leukopenia, thrombocytopenia, worsening liver failure, jaundice, and acute renal failure.  Imaging showing continued diffuse lymphadenopathy in setting of non-Hodgkin's lymphoma.  GI consulted for evaluation.  Oncology has been consulted for evaluation.  Palliative medicine team consulted to assist with complex medical decision making.   Recommendations/Plan: # Complex medical decision making/goals of care:      -Patient unable to participate in complex medical decision making at that time.                -Discussed care with patient's daughter at bedside as detailed above in HPI.                  -  Code Status: No established as DNR/DNI with limited medical interventions.                               -  Daughter and sister Cynthia Velasquez who participated via phone from [place] acknowledged the serious irreversible nature of the patient's overall condition.  Hence, it is medically appropriate to consider a CODE STATUS of DNR/DNI.  They describe the patient is a very proud private person who has a lot of regard for her respect and dignity.  We discussed that although measures of her resuscitative event at end-of-life will not provide her with dignity and may cause more harm and suffering at  end-of-life.  Hence, DNR/DNI limited medical interventions has been chosen at present  # Symptom management                -Pain, acute in setting of non-Hodgkin's lymphoma                               -Continue IV Dilaudid to 0.2-0.5 mg every 3 hours as needed for severe pain                               -Continue oral oxycodone to 2.5-5 mg every 4 hours as needed   # Psycho-social/Spiritual Support:  - Support System: daughter   # Discharge Planning:  To Be Determined: Daughter wishes to continue reasonable medical interventions for now such as IV fluids and other measures for management of hypercalcemia.  However, I discussed with her in detail about residential hospice and comfort measures.  She has for a time trial of current interventions for the next 24-48 hours so that she can discuss with her siblings in [place] and with other relatives about transferring to hospice towards the end of this hospitalization.  For today, DNR/DNI has been established.  Discussed with: patient's daughter, Cynthia Velasquez on the phone Thank you for allowing the palliative care team to participate in the care Cynthia Velasquez.  High MDM Cynthia Hawking, MD  Palliative Care Provider PMT # [phone]  If patient remains symptomatic despite maximum doses, please call PMT at [phone] between 0700 and 1900. Outside of these hours, please call attending, as PMT does not have night coverage.  Personally spent 52 minutes in patient care including extensive chart review (labs, imaging, progress/consult notes, vital signs), medically appropraite exam, discussed with treatment team, education to patient, family, and staff, documenting clinical information, medication review and management, coordination of care, and available advanced directive documents.   *Please note that this is a verbal dictation therefore any spelling or grammatical errors are due to the "[employer] One" system interpretation.

## 2023-08-26 NOTE — Progress Notes (Signed)
Progress Note    Pablo Ferko   ZOX:[id number]  DOB: Sep 29, 1950  DOA: 08/24/2023     1 PCP: [name], MD  Initial CC: Lethargy, weakness, confusion  Hospital Course: [name] is a 72 year old female with PMH multiple cancer history (breast cancer, anal cancer, most recently lymphoma), HTN, HLD, depression/anxiety, GERD. She presented with worsening lethargy and confusion with associated weakness.  Typically she can ambulate independently at home and had slowly become more dependent on daughter for assistance.  Patient received chemotherapy 08/11/2023 of Bendamustine under the care of Dr. Candise Che after consultation as well with Dr. Alphonsa Gin at [facility] who also concurred with this recommended treatment.  Patient's daughter who is at the bedside states that she was doing well after her chemotherapy at the end of November, seemed to have good energy, was still driving herself and even going to work.  However over the last 5 days, she has had progressive weakness, has not been eating or drinking very much, has had worsening abdominal pain, and difficulty ambulating.  The morning of admission she was unable to get out of her bed due to worsened weakness.  On workup she was found to have hypercalcemia, worsened renal function, and elevated LFTs. She underwent multiple imaging studies.  CTA chest negative for PE and showed extensive lymphadenopathy in the neck, axilla, mediastinum, and hila similar from recent PET scan. Abdominal imaging showed diffuse lymphadenopathy throughout the abdomen and pelvis. MRCP was then performed which showed no biliary dilatation and no signs of choledocholithiasis.  She has had a prior cholecystectomy. Palliative care and GI were consulted on admission.  Interval History:  Not much change from yesterday.  She is still moaning in bed and unable to interact especially verbally.  Her daughter bedside states she has also not had anything to eat or drink since  yesterday and has not appeared to want to. They have talked with oncology and palliative care.  Tentative plan appears to be watching for any potential improvement while further family tries to arrive from out of state.  Assessment and Plan: * Acute metabolic encephalopathy - Patient presented with progressively worsening confusion, lethargy, weakness - Suspect etiology is multifactorial in setting of underlying hypercalcemia, AKI, lymphoma, and failure to thrive.  She may have not tolerated Bendamustine, given on 08/11/23 and/or had reaction to with elevated LFTs -For now treating hypercalcemia and renal function as able and will monitor her mentation response (have not seen any improvement in mentation despite Ca level lowering some) - will place CLD order in case she can take in some liquids  Hypercalcemia - Corrected calcium 15 on 12/9 and improved to 13.2 on 12/10 (though no large improvement in mentation); initial symptoms included: generalized pain complaints, lethargy, confusion - s/p zoledronic acid on 08/25/2023 -Finish out 4 doses of calcitonin while trending corrected calcium and mentation - Continue fluids - Trend CMP  Neutropenia (HCC) - s/p bendamustine on 11/25; nadir not til week 3 so might fall further or other etiology for drop currently - ANC ~1000 currently - will place on neutropenic precautions and trend for now; currently afebrile; if develops a fever and depending on goals of care, would need to initiate empiric coverage although given poor prognosis as discussed with oncology and family it appears more reasonable to continue pursuing a comfort care approach with any further decline   Elevated liver enzymes - Due to overall progression of peripheral T-cell lymphoma involving the abdomen -MRCP negative for signs of obstruction - Some  mild improvement in LFTs today - GOC discussions have been held with family and oncology as well.  They understand poor prognosis at  this time  AKI (acute kidney injury) [facility]) - Did have normal renal function back in June 2024 but starting July 2024 she had AKI that does not appear to have ever fully recovered and creatinine has been hovering around the 1.6 range with GFR in the low 30s -Creatinine slightly up from that on admission which has further up trended up again today - Given reported history of poor intake, suspecting some volume depletion for now - Continue fluids - Trend CMP  Counseling regarding advance care planning and goals of care - Agree with palliative care consultation.  Prognosis certainly guarded and if does not improve with above measures, would be appropriate for more consideration of hospice - Greatly appreciate conversation between oncology and patient's daughters on 08/25/2023; they understand poor prognosis especially as patient continues to decline and/or not improve -Daughters plan to monitor clinically for another 1 to 2 days before making further decisions regarding de-escalation of care and likely transitioning home with hospice  Generalized weakness - Multifactorial as noted above - Daughter states patient wishes are to return home if she were approaching end-of-life.  Holding off on evals for now   Old records reviewed in assessment of this patient  Antimicrobials:   DVT prophylaxis:  SCDs Start: 08/24/23 1205   Code Status:   Code Status: Limited: Do not attempt resuscitation (DNR) -DNR-LIMITED -Do Not Intubate/DNI   Mobility Assessment (Last 72 Hours)     Mobility Assessment     Row Name 08/26/23 0800 08/25/23 2051 08/25/23 0845 08/24/23 2258 08/24/23 1330   Does patient have an order for bedrest or is patient medically unstable Yes- Bedfast (Level 1) - Complete Yes- Bedfast (Level 1) - Complete No - Continue assessment No - Continue assessment No - Continue assessment   What is the highest level of mobility based on the progressive mobility assessment? Level 1 (Bedfast) -  Unable to balance while sitting on edge of bed Level 1 (Bedfast) - Unable to balance while sitting on edge of bed Level 1 (Bedfast) - Unable to balance while sitting on edge of bed Level 2 (Chairfast) - Balance while sitting on edge of bed and cannot stand Level 1 (Bedfast) - Unable to balance while sitting on edge of bed   Is the above level different from baseline mobility prior to current illness? Yes - Recommend PT order Yes - Recommend PT order Yes - Recommend PT order Yes - Recommend PT order No - Consider discontinuing PT/OT            Barriers to discharge: none Disposition Plan: Home Status is: Inpatient  Objective: Blood pressure 120/71, pulse 71, temperature 98 F (36.7 C), temperature source Oral, resp. rate 17, height 5\' 5"  (1.651 m), weight 80.8 kg, SpO2 95%.  Examination:  Physical Exam Constitutional:      Comments: Adult woman laying in bed difficult to arouse but is able to say a few words and follows commands and moves all 4 extremities  HENT:     Head: Normocephalic and atraumatic.     Mouth/Throat:     Mouth: Mucous membranes are dry.  Eyes:     General: Scleral icterus present.     Extraocular Movements: Extraocular movements intact.  Cardiovascular:     Rate and Rhythm: Normal rate and regular rhythm.  Pulmonary:     Effort: Pulmonary effort is  normal. No respiratory distress.     Breath sounds: Normal breath sounds. No wheezing.  Abdominal:     General: Bowel sounds are normal.     Palpations: Abdomen is soft.     Tenderness: There is abdominal tenderness (Generalized).  Musculoskeletal:        General: No swelling. Normal range of motion.     Cervical back: Normal range of motion and neck supple.  Skin:    General: Skin is warm and dry.  Neurological:     Comments: Grossly confused but follows commands and moves all 4 extremities      Consultants:  GI Palliative care Oncology  Procedures:    Data Reviewed: Results for orders placed or  performed during the hospital encounter of 08/24/23 (from the past 24 hour(s))  Glucose, capillary     Status: Abnormal   Collection Time: 08/25/23  4:54 PM  Result Value Ref Range   Glucose-Capillary 176 (H) 70 - 99 mg/dL  Glucose, capillary     Status: Abnormal   Collection Time: 08/25/23  8:48 PM  Result Value Ref Range   Glucose-Capillary 183 (H) 70 - 99 mg/dL   Comment 1 Notify RN   Glucose, capillary     Status: Abnormal   Collection Time: 08/25/23 11:39 PM  Result Value Ref Range   Glucose-Capillary 204 (H) 70 - 99 mg/dL   Comment 1 Notify RN   CBC with Differential/Platelet     Status: Abnormal   Collection Time: 08/26/23  3:11 AM  Result Value Ref Range   WBC 1.6 (L) 4.0 - 10.5 K/uL   RBC 3.66 (L) 3.87 - 5.11 MIL/uL   Hemoglobin 10.4 (L) 12.0 - 15.0 g/dL   HCT 40.9 (L) 81.1 - 91.4 %   MCV 88.3 80.0 - 100.0 fL   MCH 28.4 26.0 - 34.0 pg   MCHC 32.2 30.0 - 36.0 g/dL   RDW 78.2 (H) 95.6 - 21.3 %   Platelets 29 (LL) 150 - 400 K/uL   nRBC 3.8 (H) 0.0 - 0.2 %   Neutrophils Relative % 65 %   Neutro Abs 1.0 (L) 1.7 - 7.7 K/uL   Lymphocytes Relative 28 %   Lymphs Abs 0.4 (L) 0.7 - 4.0 K/uL   Monocytes Relative 5 %   Monocytes Absolute 0.1 0.1 - 1.0 K/uL   Eosinophils Relative 0 %   Eosinophils Absolute 0.0 0.0 - 0.5 K/uL   Basophils Relative 1 %   Basophils Absolute 0.0 0.0 - 0.1 K/uL   Immature Granulocytes 1 %   Abs Immature Granulocytes 0.02 0.00 - 0.07 K/uL   Polychromasia PRESENT    Target Cells PRESENT    Ovalocytes PRESENT   Comprehensive metabolic panel     Status: Abnormal   Collection Time: 08/26/23  3:11 AM  Result Value Ref Range   Sodium 140 135 - 145 mmol/L   Potassium 3.7 3.5 - 5.1 mmol/L   Chloride 109 98 - 111 mmol/L   CO2 22 22 - 32 mmol/L   Glucose, Bld 211 (H) 70 - 99 mg/dL   BUN 57 (H) 8 - 23 mg/dL   Creatinine, Ser 0.86 (H) 0.44 - 1.00 mg/dL   Calcium 57.8 (H) 8.9 - 10.3 mg/dL   Total Protein 4.6 (L) 6.5 - 8.1 g/dL   Albumin 2.3 (L) 3.5 -  5.0 g/dL   AST 469 (H) 15 - 41 U/L   ALT 146 (H) 0 - 44 U/L   Alkaline Phosphatase 371 (H)  38 - 126 U/L   Total Bilirubin 6.7 (H) <1.2 mg/dL   GFR, Estimated 21 (L) >60 mL/min   Anion gap 9 5 - 15  Magnesium     Status: None   Collection Time: 08/26/23  3:11 AM  Result Value Ref Range   Magnesium 2.0 1.7 - 2.4 mg/dL  Glucose, capillary     Status: Abnormal   Collection Time: 08/26/23  4:05 AM  Result Value Ref Range   Glucose-Capillary 201 (H) 70 - 99 mg/dL   Comment 1 Notify RN   Glucose, capillary     Status: Abnormal   Collection Time: 08/26/23  8:18 AM  Result Value Ref Range   Glucose-Capillary 186 (H) 70 - 99 mg/dL  Glucose, capillary     Status: Abnormal   Collection Time: 08/26/23 11:50 AM  Result Value Ref Range   Glucose-Capillary 170 (H) 70 - 99 mg/dL    I have reviewed pertinent nursing notes, vitals, labs, and images as necessary. I have ordered labwork to follow up on as indicated.  I have reviewed the last notes from staff over past 24 hours. I have discussed patient's care plan and test results with nursing staff, CM/SW, and other staff as appropriate.  Time spent: Greater than 50% of the 55 minute visit was spent in counseling/coordination of care for the patient as laid out in the A&P.   LOS: 1 day   Lewie Chamber, MD [facility] 08/26/2023, 2:29 PM

## 2023-08-26 NOTE — Progress Notes (Signed)
[facility] Gastroenterology Progress Note  [name] 72 y.o. 1951-03-25   Subjective: Resting in bed. Opens eyes. Non-verbal.  Objective: Vital signs: Vitals:   08/26/23 0949 08/26/23 1342  BP: (!) 143/94 120/71  Pulse: 76 71  Resp: 17 17  Temp: 98.1 F (36.7 C) 98 F (36.7 C)  SpO2: 96% 95%    Physical Exam: Gen: somnolent, elderly, well-nourished, no acute distress  HEENT: anicteric sclera CV: RRR Chest: CTA B Abd: soft, nontender, nondistended, +BS Ext: no edema  Lab Results: Recent Labs    08/25/23 0025 08/26/23 0311  NA 143 140  K 3.6 3.7  CL 109 109  CO2 20* 22  GLUCOSE 63* 211*  BUN 49* 57*  CREATININE 2.03* 2.37*  CALCIUM 13.8* 11.8*  MG  --  2.0   Recent Labs    08/25/23 0025 08/26/23 0311  AST 446* 408*  ALT 150* 146*  ALKPHOS 424* 371*  BILITOT 8.7* 6.7*  PROT 4.8* 4.6*  ALBUMIN 2.5* 2.3*   Recent Labs    08/24/23 0835 08/25/23 0025 08/26/23 0311  WBC 1.5* 1.5* 1.6*  NEUTROABS 0.9*  --  1.0*  HGB 13.2 11.3* 10.4*  HCT 41.1 35.0* 32.3*  MCV 88.6 87.7 88.3  PLT 43* 35* 29*      Assessment/Plan: Elevated LFTs - multifactorial source but no signs of biliary obstruction. LFTs improving. Agree with palliative care. Will sign off. Call if questions.   Shirley Friar 08/26/2023, 3:29 PM  Questions please call [phone] ID: Cynthia Velasquez, female   DOB: March 15, 1951, 71 y.o.   MRN: [id number]

## 2023-08-26 NOTE — Progress Notes (Signed)
[name] Kitchen  HEMATOLOGY/ONCOLOGY INPATIENT PROGRESS NOTE  Date of Service: 08/26/2023  Inpatient Attending: .Lewie Chamber, MD   SUBJECTIVE  Patient was seen in oncologic follow-up and for goals of care discussion for her severe progressive peripheral T-cell lymphoma. Patient was recently started on Bendamustine Rituxan to try to salvage her significant progression from her peripheral T-cell lymphoma in the abdomen and liver but appears to have continued to progress and was admitted with altered mental status in the context of worsening liver function tests and hypercalcemia both related to likely progressive lymphoma. She was recently referred and seen by Dr. Alphonsa Gin at [facility] to evaluate for any other treatment possibilities and was recommend considerations of standard of care treatments with no specific clinical trial options. Patient when seen today is somnolent but comfortable and is unable to provide any information. Daughter was not at bedside but I called her on the phone and gave her an update regarding the patient's grave prognosis and poor candidacy for additional treatment and concerning complications from progressive lymphoma. All of the daughters questions were answered in details.  We discussed that the patient has been through several treatments for 3 different cancers including her breast cancer renal cancer and now her aggressive peripheral T-cell lymphoma.  She is been through multiple lines of treatment for her peripheral T-cell lymphoma and has now significant progression within the liver and also lymphadenopathy which is causing progressive liver failure and hypercalcemia. We discussed that it would be quite appropriate to consider best supportive cares through hospice.   OBJECTIVE:   PHYSICAL EXAMINATION: . Vitals:   08/26/23 0020 08/26/23 0149 08/26/23 0612 08/26/23 0949  BP:  137/74 (!) 149/84 (!) 143/94  Pulse:  81 84 76  Resp:  16 16 17   Temp: 99 F (37.2  C) 98.4 F (36.9 C) 98.5 F (36.9 C) 98.1 F (36.7 C)  TempSrc: Axillary Oral Axillary Oral  SpO2:  90% 94% 96%  Weight:      Height:       Filed Weights   08/24/23 1331  Weight: 178 lb 3.2 oz (80.8 kg)   .Body mass index is 29.65 kg/m.  GENERAL: Somnolent and not easily arousable .comfortable  SKIN: Jaundiced hue EYES: Icteric sclera  OROPHARYNX:no exudate, no erythema and lips, buccal mucosa, and tongue normal  LUNGS: clear to auscultation with normal respiratory effort HEART: regular rate & rhythm ABDOMEN: abdomen mildly distended, hyperactive bowel sounds NEURO: Somnolent and not easily arousable.  MEDICAL HISTORY:  Past Medical History:  Diagnosis Date   Anal cancer (HCC) dx'd 05/2019   Anxiety    Arthritis    Bilateral breast cancer (HCC) 10/21/2013   Breast cancer (HCC)    Cancer (HCC)    Depression    Gallstones    GERD (gastroesophageal reflux disease)    doesn't take any meds for this   Headache    h/o migraines       History of bladder infections    History of hiatal hernia    History of migraine    Hyperlipidemia    takes Simvastatin daily   Hypertension    takes Hyzaar   Joint pain    Joint swelling    Lumbar stenosis    Lymphoma (HCC)    NHL   Neuropathy 09/07/2013   Pneumonia    Pre-diabetes     SURGICAL HISTORY: Past Surgical History:  Procedure Laterality Date   ABDOMINAL HYSTERECTOMY     bone spur removed from left foot  CHOLECYSTECTOMY     COLONOSCOPY     COLONOSCOPY WITH PROPOFOL Bilateral 12/07/2021   Procedure: COLONOSCOPY WITH PROPOFOL;  Surgeon: Vida Rigger, MD;  Location: [facility];  Service: Endoscopy;  Laterality: Bilateral;   double mastectomy      ESOPHAGOGASTRODUODENOSCOPY N/A 12/07/2021   Procedure: ESOPHAGOGASTRODUODENOSCOPY (EGD);  Surgeon: Vida Rigger, MD;  Location: [facility];  Service: Endoscopy;  Laterality: N/A;   HERNIA REPAIR     umbilical   IR IMAGING GUIDED PORT INSERTION  06/25/2022   right knee  arthroscopy     right shoulder arthroscopy     surgery for hiatal hernia     TONSILLECTOMY     TOTAL HIP ARTHROPLASTY Left 02/12/2013   TOTAL HIP ARTHROPLASTY Left 02/12/2013   Procedure: LEFT TOTAL HIP ARTHROPLASTY;  Surgeon: Nestor Lewandowsky, MD;  Location: [facility] OR;  Service: Orthopedics;  Laterality: Left;   TOTAL HIP ARTHROPLASTY Right 05/03/2013   Procedure: TOTAL HIP ARTHROPLASTY;  Surgeon: Nestor Lewandowsky, MD;  Location: [facility] OR;  Service: Orthopedics;  Laterality: Right;   TOTAL KNEE ARTHROPLASTY Right 02/27/2015   Procedure: TOTAL KNEE ARTHROPLASTY;  Surgeon: Gean Birchwood, MD;  Location: [facility] OR;  Service: Orthopedics;  Laterality: Right;   TOTAL SHOULDER ARTHROPLASTY Right 09/12/2016   Procedure: RIGHT TOTAL SHOULDER ARTHROPLASTY;  Surgeon: Jones Broom, MD;  Location: [facility] OR;  Service: Orthopedics;  Laterality: Right;  RIGHT TOTAL SHOULDER ARTHROPLASTY    SOCIAL HISTORY: Social History   Socioeconomic History   Marital status: Single    Spouse name: Not on file   Number of children: 1   Years of education: Not on file   Highest education level: Not on file  Occupational History   Not on file  Tobacco Use   Smoking status: Never   Smokeless tobacco: Never  Vaping Use   Vaping status: Never Used  Substance and Sexual Activity   Alcohol use: No    Comment: occ   Drug use: No   Sexual activity: Not Currently  Other Topics Concern   Not on file  Social History Narrative   Are you right handed or left handed? Right   Are you currently employed ? no   What is your current occupation?retired   Do you live at home alone? no   Who lives with you? Daughter and granddaughter   What type of home do you live in: 1 story or 2 story? one   Caffeine 1 cup a day   Social Determinants of Health   Financial Resource Strain: Not on file  Food Insecurity: No Food Insecurity (08/24/2023)   Hunger Vital Sign    Worried About Running Out of Food in the Last Year: Never true    Ran Out of Food  in the Last Year: Never true  Transportation Needs: No Transportation Needs (08/24/2023)   PRAPARE - Administrator, Civil Service (Medical): No    Lack of Transportation (Non-Medical): No  Physical Activity: Not on file  Stress: Not on file  Social Connections: Unknown (01/29/2022)   [facility] from [facility]   Social Network    Social Network: Not on file  Intimate Partner Violence: Not At Risk (08/24/2023)   Humiliation, Afraid, Rape, and Kick questionnaire    Fear of Current or Ex-Partner: No    Emotionally Abused: No    Physically Abused: No    Sexually Abused: No    FAMILY HISTORY: Family History  Adopted: Yes  Problem Relation Age of  Onset   Allergic rhinitis Neg Hx    Angioedema Neg Hx    Atopy Neg Hx    Eczema Neg Hx    Immunodeficiency Neg Hx    Urticaria Neg Hx    Asthma Neg Hx     ALLERGIES:  is allergic to tramadol, belinostat, codeine, and vicodin [hydrocodone-acetaminophen].  MEDICATIONS:  Scheduled Meds:  calcitonin  4 Units/kg Subcutaneous BID   Chlorhexidine Gluconate Cloth  6 each Topical Daily   methylPREDNISolone (SOLU-MEDROL) injection  125 mg Intravenous BID   pantoprazole (PROTONIX) IV  40 mg Intravenous Daily   Continuous Infusions:  dextrose 5 % and 0.2 % NaCl 75 mL/hr at 08/26/23 0944   PRN Meds:.albuterol, hydrALAZINE, HYDROmorphone (DILAUDID) injection, ondansetron **OR** ondansetron (ZOFRAN) IV, oxyCODONE, sodium chloride flush, traZODone  REVIEW OF SYSTEMS:    10 Point review of Systems was done is negative except as noted above.   LABORATORY DATA:  I have reviewed the data as listed  .    Latest Ref Rng & Units 08/25/2023   12:25 AM 08/24/2023    8:35 AM  CBC  WBC 4.0 - 10.5 K/uL 1.5  1.5   Hemoglobin 12.0 - 15.0 g/dL 63.8  75.6   Hematocrit 36.0 - 46.0 % 35.0  41.1   Platelets 150 - 400 K/uL 35  43     .    Latest Ref Rng & Units 08/25/2023   12:25 AM 08/24/2023    8:35 AM  CMP  Glucose 70 -  99 mg/dL 63  94   BUN 8 - 23 mg/dL 49  41   Creatinine 4.33 - 1.00 mg/dL 2.95  1.88   Sodium 416 - 145 mmol/L 143  137   Potassium 3.5 - 5.1 mmol/L 3.6  3.5   Chloride 98 - 111 mmol/L 109  102   CO2 22 - 32 mmol/L 20  23   Calcium 8.9 - 10.3 mg/dL 60.6  30.1   Total Protein 6.5 - 8.1 g/dL 4.8  5.6   Total Bilirubin <1.2 mg/dL 8.7  60.1   Alkaline Phos 38 - 126 U/L 424  483   AST 15 - 41 U/L 446  534   ALT 0 - 44 U/L 150  183      RADIOGRAPHIC STUDIES: I have personally reviewed the radiological images as listed and agreed with the findings in the report. MR 3D Recon At Scanner  Result Date: 08/25/2023 CLINICAL DATA:  Jaundice, T-cell lymphoma EXAM: MRI ABDOMEN WITHOUT CONTRAST  (INCLUDING MRCP) TECHNIQUE: Multiplanar multisequence MR imaging of the abdomen was performed. Heavily T2-weighted images of the biliary and pancreatic ducts were obtained, and three-dimensional MRCP images were rendered by post processing. COMPARISON:  CT abdomen/pelvis dated 08/24/2023. PET-CT dated 08/07/2023. FINDINGS: Motion degraded images. Lower chest: Trace bilateral pleural effusions. Hepatobiliary: Liver is grossly unremarkable. No focal hepatic lesion is seen. Status post cholecystectomy. No intrahepatic or extrahepatic dilatation. Pancreas:  Within normal limits. Spleen:  Within normal limits. Adrenals/Urinary Tract:  Adrenal glands are within normal limits. Bilateral renal cysts, measuring up to 16 mm in the right upper kidney (series 4/image 27), benign (Bosniak I). No follow-up is recommended. No hydronephrosis. Stomach/Bowel: Stomach is within normal limits. Visualized bowel is unremarkable. Vascular/Lymphatic:  No evidence of abdominal aortic aneurysm. Upper abdominal and retroperitoneal lymphadenopathy, including a dominant 2.4 cm short axis left periaortic nodal mass (series 4/image 41). Other:  Trace perihepatic and perisplenic ascites. Musculoskeletal: No focal osseous lesions.  Body wall edema.  IMPRESSION:  Motion degraded images. Status post cholecystectomy. No intrahepatic or extrahepatic biliary dilatation. Upper abdominal and retroperitoneal lymphadenopathy, corresponding to the patient's known lymphoma, better evaluated on recent CT and PET. Trace bilateral pleural effusions. Trace upper abdominal ascites. Body wall edema. Electronically Signed   By: Charline Bills M.D.   On: 08/25/2023 01:15   MR ABDOMEN MRCP WO CONTRAST  Result Date: 08/25/2023 CLINICAL DATA:  Jaundice, T-cell lymphoma EXAM: MRI ABDOMEN WITHOUT CONTRAST  (INCLUDING MRCP) TECHNIQUE: Multiplanar multisequence MR imaging of the abdomen was performed. Heavily T2-weighted images of the biliary and pancreatic ducts were obtained, and three-dimensional MRCP images were rendered by post processing. COMPARISON:  CT abdomen/pelvis dated 08/24/2023. PET-CT dated 08/07/2023. FINDINGS: Motion degraded images. Lower chest: Trace bilateral pleural effusions. Hepatobiliary: Liver is grossly unremarkable. No focal hepatic lesion is seen. Status post cholecystectomy. No intrahepatic or extrahepatic dilatation. Pancreas:  Within normal limits. Spleen:  Within normal limits. Adrenals/Urinary Tract:  Adrenal glands are within normal limits. Bilateral renal cysts, measuring up to 16 mm in the right upper kidney (series 4/image 27), benign (Bosniak I). No follow-up is recommended. No hydronephrosis. Stomach/Bowel: Stomach is within normal limits. Visualized bowel is unremarkable. Vascular/Lymphatic:  No evidence of abdominal aortic aneurysm. Upper abdominal and retroperitoneal lymphadenopathy, including a dominant 2.4 cm short axis left periaortic nodal mass (series 4/image 41). Other:  Trace perihepatic and perisplenic ascites. Musculoskeletal: No focal osseous lesions.  Body wall edema. IMPRESSION: Motion degraded images. Status post cholecystectomy. No intrahepatic or extrahepatic biliary dilatation. Upper abdominal and retroperitoneal  lymphadenopathy, corresponding to the patient's known lymphoma, better evaluated on recent CT and PET. Trace bilateral pleural effusions. Trace upper abdominal ascites. Body wall edema. Electronically Signed   By: Charline Bills M.D.   On: 08/25/2023 01:15   NM PET Image Restag (PS) Skull Base To Thigh  Result Date: 08/24/2023 CLINICAL DATA:  Subsequent treatment strategy for T-cell lymphoma. EXAM: NUCLEAR MEDICINE PET SKULL BASE TO THIGH TECHNIQUE: 10.2 mCi F-18 FDG was injected intravenously. Full-ring PET imaging was performed from the skull base to thigh after the radiotracer. CT data was obtained and used for attenuation correction and anatomic localization. Fasting blood glucose: 78 mg/dl COMPARISON:  CT abdomen pelvis 07/31/2023, PET 05/09/2023. FINDINGS: Mediastinal blood pool activity: SUV max 2.5 Liver activity: SUV max 7.4 NECK: New right nasopharyngeal hypermetabolic nodule measuring 1.7 x 2.7 cm (4/14), SUV max 16.0. Otherwise, interval decrease in size and hypermetabolism involving lymph nodes throughout the neck. Index left submandibular lymph node measures 10 mm (4/30), SUV max 8.4, compared to 2.4 cm and 19.8 previously. Incidental CT findings: None. CHEST: While supraclavicular lymph nodes appear decreased in size and hypermetabolism, there are multiple new subpectoral, axillary, mediastinal, internal mammary, extrapleural and periesophageal hypermetabolic lymph nodes. Index left axillary lymph node measures 12 mm (4/54), SUV max 12.4, new. New hypermetabolic pulmonary nodules with index suprahilar left upper lobe nodule measuring 1.2 x 1.6 cm (7/22), SUV max 12.1. New hypermetabolic patchy ground-glass in the apical segment right upper lobe, possibly infectious/inflammatory in etiology. Incidental CT findings: Right IJ Port-A-Cath terminates at the SVC RA junction. Atherosclerotic calcification of the aorta. Heart is enlarged. No pericardial or pleural effusion. ABDOMEN/PELVIS: Diffuse  heterogeneous uptake in the liver, SUV max 7.4. Comparatively, SUV max for the spleen is 5.5. Largely new hypermetabolic abdominal peritoneal ligament, retrocrural, abdominopelvic retroperitoneal, small-bowel mesenteric and right inguinal adenopathy. Index gastrohepatic ligament lymph node measures 2.5 cm, SUV max 10.7, new. Incidental CT findings: Cholecystectomy. Low-attenuation lesion in the right kidney.  No specific follow-up necessary. Presacral edema. SKELETON: Somewhat patchy uptake in the osseous structures without focal hypermetabolism. Incidental CT findings: Right shoulder and bilateral hip arthroplasties. Degenerative changes in the spine and left shoulder. IMPRESSION: 1. Overall disease progression with new, enlarging and increasingly hypermetabolic lymph nodes throughout the neck, chest, abdomen and pelvis, as well as new hypermetabolic pulmonary nodules (Deauville 5). 2.  Aortic atherosclerosis (ICD10-I70.0). Electronically Signed   By: Leanna Battles M.D.   On: 08/24/2023 17:06   CT Angio Chest PE W and/or Wo Contrast  Result Date: 08/24/2023 CLINICAL DATA:  Increasing weakness since Thursday, history of lymphoma EXAM: CT ANGIOGRAPHY CHEST CT ABDOMEN AND PELVIS WITH CONTRAST TECHNIQUE: Multidetector CT imaging of the chest was performed using the standard protocol during bolus administration of intravenous contrast. Multiplanar CT image reconstructions and MIPs were obtained to evaluate the vascular anatomy. Multidetector CT imaging of the abdomen and pelvis was performed using the standard protocol during bolus administration of intravenous contrast. RADIATION DOSE REDUCTION: This exam was performed according to the departmental dose-optimization program which includes automated exposure control, adjustment of the mA and/or kV according to patient size and/or use of iterative reconstruction technique. CONTRAST:  80mL OMNIPAQUE IOHEXOL 350 MG/ML SOLN COMPARISON:  08/07/2023, 07/31/2023,  05/09/2023 FINDINGS: CTA CHEST FINDINGS Cardiovascular: This is a technically adequate evaluation of the pulmonary vasculature. No filling defects or pulmonary emboli. The heart is unremarkable without pericardial effusion. Ectasia of the thoracic aorta without evidence of aneurysm or dissection. Atherosclerosis of the aortic arch. Mediastinum/Nodes: Extensive lymphadenopathy is identified, similar in appearance to recent PET scan. Index lymph nodes are as follows: Right cervical chain, 2.9 cm, image 4/4. Left axilla, 1.2 cm short axis, image 50/4 AP window, 1.3 cm short axis, image 54/4. Left hilum, 1.6 cm short axis, image 42/4. Left internal mammary chain, 1.2 cm short axis, image 55/4. The lymphadenopathy has progressed since the 12/28/2022 chest CT, but is similar in appearance to recent PET scan 08/07/2023. Thyroid, trachea, and esophagus are unremarkable. Lungs/Pleura: There are 2 new left upper lobe pulmonary nodules. On image 20/12 there is an 8 mm nodule, which is metabolically active on recent PET scan. On image 55/12 there is a 7 mm nodule, with faint low level activity on recent PET scan. Findings are suspicious for metastatic disease. Hypoventilatory changes are seen at the lung bases. Curvilinear areas of subpleural consolidation at the lung apices may reflect atelectasis. No effusion or pneumothorax. Musculoskeletal: 9 mm subcutaneous nodule in the posterior left shoulder image 43/4 corresponds to a metabolically active lesion on PET scan, and is new since prior CT. No acute or destructive bony abnormalities. Severe degenerative changes of the left shoulder. Unremarkable right shoulder arthroplasty. Reconstructed images demonstrate no additional findings. Review of the MIP images confirms the above findings. CT ABDOMEN and PELVIS FINDINGS Hepatobiliary: No focal liver abnormality is seen. Status post cholecystectomy. No biliary dilatation. Pancreas: Unremarkable. No pancreatic ductal dilatation or  surrounding inflammatory changes. Spleen: Normal in size without focal abnormality. Adrenals/Urinary Tract: Stable appearance of the adrenals and kidneys. No urinary tract calculi or obstruction. Bladder is unremarkable. Stomach/Bowel: No bowel obstruction or ileus. Scattered colonic diverticulosis without diverticulitis. No bowel wall thickening or inflammatory change. Vascular/Lymphatic: Persistent lymphadenopathy throughout the abdomen and pelvis. Index lymph nodes are as follows: Right retrocrural, image 24/2, 12 mm.  Previously 16 mm. Porta hepatis, image 33/2, 13 mm.  No change. Retroperitoneal lymph node mass, image 50/2, 78 x 30 mm, previously 89 x 31 mm. Central mesenteric,  image 59/2, 9 mm.  Previously 14 mm. Right inguinal, image 90/2, 11 mm.  Previously 15 mm. Stable aortic atherosclerosis. There is extrinsic compression upon the IVC and bilateral common iliac veins due to the retroperitoneal lymph node mass, stable since prior study. Reproductive: Status post hysterectomy. No adnexal masses. Other: Trace free fluid within the paracolic gutters. No free intraperitoneal gas. Small fat containing supraumbilical ventral hernias are stable. Musculoskeletal: No acute or destructive bony abnormalities. Reconstructed images demonstrate no additional findings. Review of the MIP images confirms the above findings. IMPRESSION: Chest: 1. No evidence of pulmonary embolus. 2. Extensive lymphadenopathy within the neck, axillary regions, mediastinum, and hila. Similar appearance to recent PET scan 08/07/2023, with progression since prior chest CT 12/28/2022. 3. New left upper lobe pulmonary nodule since the prior chest CT, corresponding to metabolically active lesions on recent PET scan, consistent with metastatic disease. 4.  Aortic Atherosclerosis (ICD10-I70.0). Abdomen/pelvis: 1. Diffuse lymphadenopathy throughout the abdomen and pelvis, minimally decreased in size since the abdominal CT 07/31/2023. No new  adenopathy is identified. 2. Trace free fluid within the bilateral paracolic gutters, nonspecific. 3. No other acute intra-abdominal or intrapelvic process. Electronically Signed   By: Sharlet Salina M.D.   On: 08/24/2023 11:06   CT ABDOMEN PELVIS W CONTRAST  Result Date: 08/24/2023 CLINICAL DATA:  Increasing weakness since Thursday, history of lymphoma EXAM: CT ANGIOGRAPHY CHEST CT ABDOMEN AND PELVIS WITH CONTRAST TECHNIQUE: Multidetector CT imaging of the chest was performed using the standard protocol during bolus administration of intravenous contrast. Multiplanar CT image reconstructions and MIPs were obtained to evaluate the vascular anatomy. Multidetector CT imaging of the abdomen and pelvis was performed using the standard protocol during bolus administration of intravenous contrast. RADIATION DOSE REDUCTION: This exam was performed according to the departmental dose-optimization program which includes automated exposure control, adjustment of the mA and/or kV according to patient size and/or use of iterative reconstruction technique. CONTRAST:  80mL OMNIPAQUE IOHEXOL 350 MG/ML SOLN COMPARISON:  08/07/2023, 07/31/2023, 05/09/2023 FINDINGS: CTA CHEST FINDINGS Cardiovascular: This is a technically adequate evaluation of the pulmonary vasculature. No filling defects or pulmonary emboli. The heart is unremarkable without pericardial effusion. Ectasia of the thoracic aorta without evidence of aneurysm or dissection. Atherosclerosis of the aortic arch. Mediastinum/Nodes: Extensive lymphadenopathy is identified, similar in appearance to recent PET scan. Index lymph nodes are as follows: Right cervical chain, 2.9 cm, image 4/4. Left axilla, 1.2 cm short axis, image 50/4 AP window, 1.3 cm short axis, image 54/4. Left hilum, 1.6 cm short axis, image 42/4. Left internal mammary chain, 1.2 cm short axis, image 55/4. The lymphadenopathy has progressed since the 12/28/2022 chest CT, but is similar in appearance to  recent PET scan 08/07/2023. Thyroid, trachea, and esophagus are unremarkable. Lungs/Pleura: There are 2 new left upper lobe pulmonary nodules. On image 20/12 there is an 8 mm nodule, which is metabolically active on recent PET scan. On image 55/12 there is a 7 mm nodule, with faint low level activity on recent PET scan. Findings are suspicious for metastatic disease. Hypoventilatory changes are seen at the lung bases. Curvilinear areas of subpleural consolidation at the lung apices may reflect atelectasis. No effusion or pneumothorax. Musculoskeletal: 9 mm subcutaneous nodule in the posterior left shoulder image 43/4 corresponds to a metabolically active lesion on PET scan, and is new since prior CT. No acute or destructive bony abnormalities. Severe degenerative changes of the left shoulder. Unremarkable right shoulder arthroplasty. Reconstructed images demonstrate no additional findings. Review of the MIP  images confirms the above findings. CT ABDOMEN and PELVIS FINDINGS Hepatobiliary: No focal liver abnormality is seen. Status post cholecystectomy. No biliary dilatation. Pancreas: Unremarkable. No pancreatic ductal dilatation or surrounding inflammatory changes. Spleen: Normal in size without focal abnormality. Adrenals/Urinary Tract: Stable appearance of the adrenals and kidneys. No urinary tract calculi or obstruction. Bladder is unremarkable. Stomach/Bowel: No bowel obstruction or ileus. Scattered colonic diverticulosis without diverticulitis. No bowel wall thickening or inflammatory change. Vascular/Lymphatic: Persistent lymphadenopathy throughout the abdomen and pelvis. Index lymph nodes are as follows: Right retrocrural, image 24/2, 12 mm.  Previously 16 mm. Porta hepatis, image 33/2, 13 mm.  No change. Retroperitoneal lymph node mass, image 50/2, 78 x 30 mm, previously 89 x 31 mm. Central mesenteric, image 59/2, 9 mm.  Previously 14 mm. Right inguinal, image 90/2, 11 mm.  Previously 15 mm. Stable aortic  atherosclerosis. There is extrinsic compression upon the IVC and bilateral common iliac veins due to the retroperitoneal lymph node mass, stable since prior study. Reproductive: Status post hysterectomy. No adnexal masses. Other: Trace free fluid within the paracolic gutters. No free intraperitoneal gas. Small fat containing supraumbilical ventral hernias are stable. Musculoskeletal: No acute or destructive bony abnormalities. Reconstructed images demonstrate no additional findings. Review of the MIP images confirms the above findings. IMPRESSION: Chest: 1. No evidence of pulmonary embolus. 2. Extensive lymphadenopathy within the neck, axillary regions, mediastinum, and hila. Similar appearance to recent PET scan 08/07/2023, with progression since prior chest CT 12/28/2022. 3. New left upper lobe pulmonary nodule since the prior chest CT, corresponding to metabolically active lesions on recent PET scan, consistent with metastatic disease. 4.  Aortic Atherosclerosis (ICD10-I70.0). Abdomen/pelvis: 1. Diffuse lymphadenopathy throughout the abdomen and pelvis, minimally decreased in size since the abdominal CT 07/31/2023. No new adenopathy is identified. 2. Trace free fluid within the bilateral paracolic gutters, nonspecific. 3. No other acute intra-abdominal or intrapelvic process. Electronically Signed   By: Sharlet Salina M.D.   On: 08/24/2023 11:06   DG Chest Portable 1 View  Result Date: 08/24/2023 CLINICAL DATA:  72 year old female with weakness.  Lymphoma. EXAM: PORTABLE CHEST 1 VIEW COMPARISON:  Portable chest 03/28/2023.  PET-CT 08/07/2023. FINDINGS: Portable AP semi upright view at 0847 hours. Right chest power port remains. Increased mediastinal contour compatible with mediastinal lymphadenopathy in this setting. Visualized tracheal air column is within normal limits. Low lung volumes. But Allowing for portable technique the lungs are clear. No pneumothorax or pleural effusion. Paucity bowel gas. No acute  osseous abnormality identified. Chronic chest wall surgical clips. Chronic right shoulder arthroplasty. IMPRESSION: 1. Evidence of progressed mediastinal lymphadenopathy since July. 2. Low lung volumes, otherwise no acute cardiopulmonary abnormality. Electronically Signed   By: Odessa Fleming M.D.   On: 08/24/2023 09:00   CT Head Wo Contrast  Result Date: 08/24/2023 CLINICAL DATA:  72 year old female with altered mental status. Increasing weakness. Lymphoma. EXAM: CT HEAD WITHOUT CONTRAST TECHNIQUE: Contiguous axial images were obtained from the base of the skull through the vertex without intravenous contrast. RADIATION DOSE REDUCTION: This exam was performed according to the departmental dose-optimization program which includes automated exposure control, adjustment of the mA and/or kV according to patient size and/or use of iterative reconstruction technique. COMPARISON:  Brain MRI 10/27/2021.  Head CT 03/28/2023. FINDINGS: Brain: Cerebral volume remains normal for age. No midline shift, ventriculomegaly, mass effect, evidence of mass lesion, intracranial hemorrhage or evidence of cortically based acute infarction. Gray-white differentiation stable and within normal limits for age. Vascular: No suspicious intracranial vascular hyperdensity.  Skull: Bilateral TMJ degeneration. No acute or suspicious osseous lesion. Sinuses/Orbits: Visualized paranasal sinuses and mastoids are stable and well aerated. Other: Visualized orbits and scalp soft tissues are within normal limits. IMPRESSION: Stable and normal for age noncontrast CT appearance of the Brain. Electronically Signed   By: Odessa Fleming M.D.   On: 08/24/2023 08:56   CT ABDOMEN PELVIS W CONTRAST  Result Date: 08/01/2023 CLINICAL DATA:  Nausea and decreased p.o. intake.  Abdominal pain. EXAM: CT ABDOMEN AND PELVIS WITH CONTRAST TECHNIQUE: Multidetector CT imaging of the abdomen and pelvis was performed using the standard protocol following bolus administration of  intravenous contrast. RADIATION DOSE REDUCTION: This exam was performed according to the departmental dose-optimization program which includes automated exposure control, adjustment of the mA and/or kV according to patient size and/or use of iterative reconstruction technique. CONTRAST:  80mL OMNIPAQUE IOHEXOL 300 MG/ML  SOLN COMPARISON:  CT abdomen and pelvis 10/29/2022 and PET/CT 05/09/2023 FINDINGS: Lower chest: No acute abnormality. Hepatobiliary: Cholecystectomy. Unremarkable liver and biliary tree. Pancreas: Unremarkable. Spleen: Unremarkable. Adrenals/Urinary Tract: Stable adrenal glands and kidneys. No urinary calculi or hydronephrosis. The bladder is obscured by streak artifact. Stomach/Bowel: Stomach is within normal limits. No bowel obstruction or bowel wall thickening. Vascular/Lymphatic: Aortic atherosclerotic calcification. New enlarged conglomerate lymphadenopathy about the aorta and IVC extending along the right iliac vessels. This measures 3.1 x 8.8 cm in the axial plane on series 2/image 46. Retrocrural node measuring 1.6 cm (series 2/image 21). 1.2 cm porta hepatis node on series 2/image 30. 1.4 cm central mesenteric node on series 2/image 54. Right inguinal lymphadenopathy measuring up to 1.5 cm (series 2/image 84). The adenopathy compresses the inferior IVC and right common iliac vein. Reproductive: Hysterectomy.  Streak artifact limits assessment. Other: Smaller free fluid in the pelvis. No free intraperitoneal air. Musculoskeletal: Bilateral THA. No acute fracture or destructive osseous lesion. IMPRESSION: 1. New enlarged conglomerate lymphadenopathy about the aorta and IVC extending along the right iliac vessels. Additional enlarged retrocrural, porta hepatis, central mesenteric, and right inguinal lymph nodes. Findings are consistent with progression of lymphoma. Aortic Atherosclerosis (ICD10-I70.0). Electronically Signed   By: Minerva Fester M.D.   On: 08/01/2023 00:13    ASSESSMENT &  PLAN:   Cynthia Velasquez is a 72 y.o.  female with    1. Relapsed 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 Plan -Results of excisional lymph node biopsy showing peripheral T-cell lymphoma not otherwise specified which is CD30 negative were discussed in detail with the patient. PET CT scan was previously reviewed and she appears to have at least stage II disease but depending on the liver lesion etiology this could represent stage IV disease. She has had anthracycline exposure as a part of her AC regimen for previous treatment of breast cancer and is therefore limited in her total lifetime anthracycline use. CD 30 negative status precludes use of Adcentris.   2. Anal Squamous Cell Carcinoma, cT2N0M0-currently in remission.  Details as noted above   3. 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 Completed tamoxifen 09/2007 - 09/2012 -2008: stage pT2N0 left breast invasive ductal carcinoma, triple negative, grade 3; s/p lumpectomy in 07/2007 and bilateral mastectomy 11/2007 (path negative for residual carcinoma in both breasts); s/p adjuvant chemo AC-T and radiation   4. H/o Chronic diarrhea -- follows with GI   5. 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 Extensive previous exposure to chemotherapy.   #  7 admitted with metabolic encephalopathy due to progressive liver failure due to progression of her peripheral T-cell lymphoma.  As well as from severe hypercalcemia related to her lymphoma.  #8 pancytopenia related to her peripheral T-cell lymphoma and Bendamustine chemotherapy. PLAN: -Patient is somnolent but comfortable and unable to provide additional information. -Patient was recently started on Bendamustine Rituxan to try to salvage her significant progression from her peripheral T-cell lymphoma in the abdomen and liver but appears to have continued to progress and was admitted with altered  mental status in the context of worsening liver function tests and hypercalcemia both related to likely progressive lymphoma. She was recently referred and seen by Dr. Alphonsa Gin at [facility] to evaluate for any other treatment possibilities and was recommend considerations of standard of care treatments with no specific clinical trial options. Patient when seen today is somnolent but comfortable and is unable to provide any information. Daughter was not at bedside but I called her on the phone and gave her an update regarding the patient's grave prognosis and poor candidacy for additional treatment and concerning complications from progressive lymphoma. All of the daughters questions were answered in details.  We discussed that the patient has been through several treatments for 3 different cancers including her breast cancer renal cancer and now her aggressive peripheral T-cell lymphoma.  She is been through multiple lines of treatment for her peripheral T-cell lymphoma and has now significant progression within the liver and also lymphadenopathy which is causing progressive liver failure and hypercalcemia. We discussed that it would be quite appropriate to consider best supportive cares through hospice. She has been started on Solu-Medrol 125 mg every 12 hours palliatively to help with hypercalcemia as well as some of the lymphoma related symptoms. She has received a bisphosphonate to help with her hypercalcemia. Palliative care is following as well for goals of care discussion Daughter is very understanding and all the questions regarding her treatment thus far grave prognosis and recommendations for best supportive care through hospice were clearly communicated to her and all her questions around this were answered in details.   .The total time spent in the appointment was 50 minutes* .  All of the patient's questions were answered with apparent satisfaction. The patient knows to call the  clinic with any problems, questions or concerns.   Wyvonnia Lora MD MS AAHIVMS [facility] Hematology/Oncology Physician [facility]  .*Total Encounter Time as defined by the Centers for Medicare and Medicaid Services includes, in addition to the face-to-face time of a patient visit (documented in the note above) non-face-to-face time: obtaining and reviewing outside history, ordering and reviewing medications, tests or procedures, care coordination (communications with other health care professionals or caregivers) and documentation in the medical record.

## 2023-08-27 DIAGNOSIS — G9341 Metabolic encephalopathy: Secondary | ICD-10-CM | POA: Diagnosis not present

## 2023-08-27 LAB — CBC WITH DIFFERENTIAL/PLATELET
Abs Immature Granulocytes: 0.05 10*3/uL (ref 0.00–0.07)
Basophils Absolute: 0 10*3/uL (ref 0.0–0.1)
Basophils Relative: 2 %
Eosinophils Absolute: 0 10*3/uL (ref 0.0–0.5)
Eosinophils Relative: 0 %
HCT: 33 % — ABNORMAL LOW (ref 36.0–46.0)
Hemoglobin: 10.5 g/dL — ABNORMAL LOW (ref 12.0–15.0)
Immature Granulocytes: 4 %
Lymphocytes Relative: 27 %
Lymphs Abs: 0.3 10*3/uL — ABNORMAL LOW (ref 0.7–4.0)
MCH: 28.2 pg (ref 26.0–34.0)
MCHC: 31.8 g/dL (ref 30.0–36.0)
MCV: 88.7 fL (ref 80.0–100.0)
Monocytes Absolute: 0.1 10*3/uL (ref 0.1–1.0)
Monocytes Relative: 4 %
Neutro Abs: 0.8 10*3/uL — ABNORMAL LOW (ref 1.7–7.7)
Neutrophils Relative %: 63 %
Platelets: 19 10*3/uL — CL (ref 150–400)
RBC: 3.72 MIL/uL — ABNORMAL LOW (ref 3.87–5.11)
RDW: 27.2 % — ABNORMAL HIGH (ref 11.5–15.5)
WBC: 1.2 10*3/uL — CL (ref 4.0–10.5)
nRBC: 4 % — ABNORMAL HIGH (ref 0.0–0.2)

## 2023-08-27 LAB — GLUCOSE, CAPILLARY
Glucose-Capillary: 154 mg/dL — ABNORMAL HIGH (ref 70–99)
Glucose-Capillary: 143 mg/dL — ABNORMAL HIGH (ref 70–99)
Glucose-Capillary: 163 mg/dL — ABNORMAL HIGH (ref 70–99)
Glucose-Capillary: 155 mg/dL — ABNORMAL HIGH (ref 70–99)
Glucose-Capillary: 153 mg/dL — ABNORMAL HIGH (ref 70–99)

## 2023-08-27 LAB — COMPREHENSIVE METABOLIC PANEL
ALT: 115 U/L — ABNORMAL HIGH (ref 0–44)
ALT: 99 U/L — ABNORMAL HIGH (ref 0–44)
AST: 216 U/L — ABNORMAL HIGH (ref 15–41)
AST: 153 U/L — ABNORMAL HIGH (ref 15–41)
Albumin: 2.4 g/dL — ABNORMAL LOW (ref 3.5–5.0)
Albumin: 2.4 g/dL — ABNORMAL LOW (ref 3.5–5.0)
Alkaline Phosphatase: 350 U/L — ABNORMAL HIGH (ref 38–126)
Alkaline Phosphatase: 337 U/L — ABNORMAL HIGH (ref 38–126)
Anion gap: 9 (ref 5–15)
Anion gap: 9 (ref 5–15)
BUN: 65 mg/dL — ABNORMAL HIGH (ref 8–23)
BUN: 70 mg/dL — ABNORMAL HIGH (ref 8–23)
CO2: 21 mmol/L — ABNORMAL LOW (ref 22–32)
CO2: 21 mmol/L — ABNORMAL LOW (ref 22–32)
Calcium: 10.4 mg/dL — ABNORMAL HIGH (ref 8.9–10.3)
Calcium: 9.5 mg/dL (ref 8.9–10.3)
Chloride: 105 mmol/L (ref 98–111)
Chloride: 107 mmol/L (ref 98–111)
Creatinine, Ser: 2.4 mg/dL — ABNORMAL HIGH (ref 0.44–1.00)
Creatinine, Ser: 2.13 mg/dL — ABNORMAL HIGH (ref 0.44–1.00)
GFR, Estimated: 21 mL/min — ABNORMAL LOW (ref >60)
GFR, Estimated: 24 mL/min — ABNORMAL LOW (ref >60)
Glucose, Bld: 161 mg/dL — ABNORMAL HIGH (ref 70–99)
Glucose, Bld: 156 mg/dL — ABNORMAL HIGH (ref 70–99)
Potassium: 3.4 mmol/L — ABNORMAL LOW (ref 3.5–5.1)
Potassium: 3.2 mmol/L — ABNORMAL LOW (ref 3.5–5.1)
Sodium: 135 mmol/L (ref 135–145)
Sodium: 137 mmol/L (ref 135–145)
Total Bilirubin: 5.6 mg/dL — ABNORMAL HIGH (ref <1.2)
Total Bilirubin: 5.2 mg/dL — ABNORMAL HIGH (ref <1.2)
Total Protein: 5.1 g/dL — ABNORMAL LOW (ref 6.5–8.1)
Total Protein: 4.8 g/dL — ABNORMAL LOW (ref 6.5–8.1)

## 2023-08-27 LAB — MAGNESIUM: Magnesium: 2.1 mg/dL (ref 1.7–2.4)

## 2023-08-27 MED ORDER — DEXTROSE-SODIUM CHLORIDE 5-0.9 % IV SOLN: INTRAVENOUS | Status: AC

## 2023-08-27 NOTE — Plan of Care (Signed)
  Problem: Clinical Measurements: Goal: Will remain free from infection Outcome: Progressing Goal: Diagnostic test results will improve Outcome: Progressing   Problem: Activity: Goal: Risk for activity intolerance will decrease Outcome: Progressing   Problem: Coping: Goal: Level of anxiety will decrease Outcome: Progressing   Problem: Elimination: Goal: Will not experience complications related to bowel motility Outcome: Progressing   Problem: Pain Management: Goal: General experience of comfort will improve Outcome: Progressing   Problem: Safety: Goal: Ability to remain free from injury will improve Outcome: Progressing   Problem: Skin Integrity: Goal: Risk for impaired skin integrity will decrease Outcome: Progressing

## 2023-08-27 NOTE — Progress Notes (Signed)
Daily Progress Note   Patient Name: Cynthia Velasquez       Date: 08/27/2023 DOB: 05/09/1951  Age: 72 y.o. MRN#: [id number] Attending Physician: Rolly Salter, MD Primary Care Physician: [name], MD Admit Date: 08/24/2023 Length of Stay: 2 days  Reason for Consultation/Follow-up: Establishing goals of care  Subjective:   CC: Patient appears confused and lethargic when seen today.     Following up regarding complex medical decision making.  Subjective:  Reviewed EMR prior to presenting to bedside.    Objective:   Vital Signs:  BP (!) 144/89 (BP Location: Right Arm)   Pulse 63   Temp 97.6 F (36.4 C) (Oral)   Resp 16   Ht 5\' 5"  (1.651 m)   Wt 80.8 kg   SpO2 97%   BMI 29.65 kg/m   Physical Exam: General: In bed, lethargic, confused, opens eyes appears uncomfortable Cardiovascular: RRR Neuro: Lethargic, confused  Imaging: I personally reviewed recent imaging.   Assessment & Plan:   Assessment: Patient is a 72 year old female with a past medical history of breast cancer, anal cancer, and recent diagnosis of non-Hodgkin's lymphoma on chemotherapy, hypertension, and GERD who was admitted on 08/24/2023 for management of decreased oral intake and increased lethargy at home.  Patient had last received chemotherapy on 08/11/2023; under the care of oncologist Dr. Candise Che and coordination of care with [facility] oncology.  Patient's daughter arrived that patient had initially been doing well after chemotherapy though over the past 5 days has gotten progressively weaker, not been eating or drinking well, worsening abdominal pain, and worsening weakness.  Upon presentation to the ER, lab work showing evidence of leukopenia, thrombocytopenia, worsening liver failure, jaundice, and acute renal failure.  Imaging showing continued diffuse lymphadenopathy in setting of non-Hodgkin's lymphoma.  GI consulted for evaluation.  Oncology has been consulted for evaluation.  Palliative medicine team  consulted to assist with complex medical decision making.   Recommendations/Plan: # Complex medical decision making/goals of care:      -Patient unable to participate in complex medical decision making at that time.                                -  Code Status:  established as DNR/DNI with limited medical interventions.                               - # Symptom management                -Pain, acute in setting of non-Hodgkin's lymphoma                               -Continue IV Dilaudid to 0.2-0.5 mg every 3 hours as needed for severe pain                               -Continue oral oxycodone to 2.5-5 mg every 4 hours as needed   # Psycho-social/Spiritual Support:  - Support System: daughter   # Discharge Planning:  To Be Determined: Daughter wishes to continue reasonable medical interventions for now such as IV fluids and other measures for management of hypercalcemia.  However, I discussed with her in detail about residential hospice and comfort measures.  She has for a time  trial of current interventions for the next 24-48 hours so that she can discuss with her siblings in [place] and with other relatives about transferring to hospice towards the end of this hospitalization.  DNR/DNI has been established.  Discussed with: IDT Thank you for allowing the palliative care team to participate in the care [name].  low MDM [name], MD  Palliative Care Provider PMT # [phone]  If patient remains symptomatic despite maximum doses, please call PMT at [phone] between 0700 and 1900. Outside of these hours, please call attending, as PMT does not have night coverage.

## 2023-08-27 NOTE — Plan of Care (Signed)
  Problem: Clinical Measurements: Goal: Ability to maintain clinical measurements within normal limits will improve Outcome: Progressing Goal: Will remain free from infection Outcome: Progressing   Problem: Clinical Measurements: Goal: Diagnostic test results will improve Outcome: Not Progressing

## 2023-08-27 NOTE — Progress Notes (Signed)
Date and time results received: 08/27/23 0710 (use smartphrase ".now" to insert current time)  Test: WBC  Critical Value: 1.2  Test: Platelet  Critical Value: 19  Name of Provider Notified: Lynden Oxford, MD  Orders Received? Or Actions Taken?: Awaiting further orders

## 2023-08-27 NOTE — Progress Notes (Signed)
[facility] Progress Note [name] OZH:[id number] DOB: 22-Oct-1950 DOA: 08/24/2023  DOS: the patient was seen and examined on 08/27/2023  Cynthia Velasquez is a 72 year old female with PMH multiple cancer history (breast cancer, anal cancer, most recently lymphoma), HTN, HLD, depression/anxiety, GERD. She presented with worsening lethargy and confusion with associated weakness.  Typically she can ambulate independently at home and had slowly become more dependent on daughter for assistance.  Patient received chemotherapy 08/11/2023 of Bendamustine under the care of Dr. Candise Che after consultation as well with Dr. Alphonsa Gin at [facility] who also concurred with this recommended treatment.  Patient's daughter who is at the bedside states that she was doing well after her chemotherapy at the end of November, seemed to have good energy, was still driving herself and even going to work.  However over the last 5 days, she has had progressive weakness, has not been eating or drinking very much, has had worsening abdominal pain, and difficulty ambulating.  The morning of admission she was unable to get out of her bed due to worsened weakness.  On workup she was found to have hypercalcemia, worsened renal function, and elevated LFTs. She underwent multiple imaging studies.  CTA chest negative for PE and showed extensive lymphadenopathy in the neck, axilla, mediastinum, and hila similar from recent PET scan. Abdominal imaging showed diffuse lymphadenopathy throughout the abdomen and pelvis. MRCP was then performed which showed no biliary dilatation and no signs of choledocholithiasis.  She has had a prior cholecystectomy. Palliative care and GI were consulted on admission.  Assessment and Plan: Acute metabolic encephalopathy Presents with progressive confusion lethargy and weakness. Hypercalcemia, AKI, lymphoma worsening as well as failure to thrive. ?  Bendamustine side effect. Aggressively treat  hypercalcemia and monitor.   Hypercalcemia - Corrected calcium 15 on 12/9 and improved to 13.2 on 12/10 (though no large improvement in mentation); initial symptoms included: generalized pain complaints, lethargy, confusion - s/p zoledronic acid on 08/25/2023 -Finish out 4 doses of calcitonin while trending corrected calcium and mentation - Continue fluids - Trend CMP   Neutropenia and thrombocytopenia from chemotherapy - s/p bendamustine on 11/25; nadir not til week 3 so might fall further or other etiology for drop currently - ANC ~1000 currently - will place on neutropenic precautions and trend for now; currently afebrile; if develops a fever and depending on goals of care, would need to initiate empiric coverage although given poor prognosis as discussed with oncology and family it appears more reasonable to continue pursuing a comfort care approach with any further decline    Elevated liver enzymes - Due to overall progression of peripheral T-cell lymphoma involving the abdomen -MRCP negative for signs of obstruction - Some mild improvement in LFTs today - GOC discussions have been held with family and oncology as well.  They understand poor prognosis at this time   AKI (acute kidney injury) [facility]) - Did have normal renal function back in June 2024 but starting July 2024 she had AKI that does not appear to have ever fully recovered and creatinine has been hovering around the 1.6 range with GFR in the low 30s -Creatinine slightly up from that on admission which has further up trended up again today - Given reported history of poor intake, suspecting some volume depletion for now - Continue fluids - Trend CMP   Counseling regarding advance care planning and goals of care - Agree with palliative care consultation.  Prognosis certainly guarded and if does not improve with above measures,  would be appropriate for more consideration of hospice - Greatly appreciate conversation between  oncology and patient's daughters on 08/25/2023; they understand poor prognosis especially as patient continues to decline and/or not improve -Daughters plan to monitor clinically for another 1 to 2 days before making further decisions regarding de-escalation of care and likely transitioning home with hospice   Generalized weakness - Multifactorial as noted above - Daughter states patient wishes are to return home if she were approaching end-of-life.  Holding off on evals for now  Subjective: Mentation improving.  No nausea no vomiting no fever no chills.  Oral intake still minimal.  Physical Exam: General: in Mild distress, No Rash Cardiovascular: S1 and S2 Present, No Murmur Respiratory: Good respiratory effort, Bilateral Air entry present. No Crackles, No wheezes Abdomen: Bowel Sound present, No tenderness Extremities: No edema Neuro: Alert and oriented self and family, no new focal deficit  Data Reviewed: I have Reviewed nursing notes, Vitals, and Lab results. Since last encounter, pertinent lab results CBC and BMP   . I have ordered test including CBC and BMP  .  Disposition: Status is: Inpatient Remains inpatient appropriate because: Improvement in mentation  SCDs Start: 08/24/23 1205   Family Communication: Daughter at bedside Level of care: Med-Surg   Vitals:   08/27/23 1025 08/27/23 1256 08/27/23 1718 08/27/23 1946  BP: (!) 144/89 (!) 145/80 116/64 (!) 129/57  Pulse: 63 62 65 70  Resp: 16 15 15 18   Temp: 97.6 F (36.4 C) 97.7 F (36.5 C) 98 F (36.7 C) 98.4 F (36.9 C)  TempSrc: Oral Oral Oral Oral  SpO2: 97% 100% 98% 96%  Weight:      Height:         Author: Lynden Oxford, MD 08/27/2023 8:05 PM  Please look on [email or web address] to find out who is on call.

## 2023-08-27 NOTE — Progress Notes (Deleted)
I saw Cynthia Velasquez in neurology clinic on 09/05/23 in follow up for bilateral carpal tunnel and chemotherapy induced neuropathy.  HPI: Cynthia Velasquez is a 72 y.o. year old female with a history of anal cancer, bilateral breast cancer, lymphoma, HTN, HLD, depression, anxiety, pre-diabetes, lumbar stenosis, migraines who we last saw on 12/20/22.  To briefly review: 10/11/22: Patient first noticed numbness and tingling in the past 2 months (~07/2022) in her feet and thumb and index fingers bilaterally. It now includes her middle fingers. She had difficulty using these fingers. She feels like there is something on her feet when there isn't. She feels some imbalance when walking. She has "gracefully went to the floor" (fell) due to feeling lightheaded and in a cold sweat (07/2022). She also feels off balance if she gets up too fast.   She was told she might have carpal tunnel syndrome and recommended to wear braces by an oncologist. She does wear them on a regular basis due to comfort. Oncology suggested the onset of neuropathy was following the first cycle of chemotherapy for lymphoma and worse with each infusion (likely 2/2 vincristine). Patient has continued with vincristine despite symptoms, but it was reduced. Patient did not want it stopped as she only has a month to go.   Patient mentions she has difficulty getting up from toilet and needs assistance with that.   Patient has not taking any or tried any neuropathic medications.   Patient is on a vitamin B complex, but only for a couple of months.   Oncology history per Dr. Clyda Greener clinic note from 09/10/22: Cynthia Velasquez has been referred to [name] by Dr. Mosetta Putt for evaluation and management of newly diagnosed T-cell non-Hodgkin's lymphoma.   Patient has a history of bilateral breast cancer status post mastectomies.  -2007: s/p right lumpectomy for ADH in 08/2006 showed DCIS ER/PR+, grade 1. Completed tamoxifen 09/2007 - 09/2012 -2008: stage pT2N0  left breast invasive ductal carcinoma, triple negative, grade 3; s/p lumpectomy in 07/2007 and bilateral mastectomy 11/2007 (path negative for residual carcinoma in both breasts); s/p adjuvant chemo AC-T and radiation  -Per pt her Genetics Testing was negative. She was adopted, no known family history     She subsequently was diagnosed with anal squamous cell carcinoma cT2 N0 M0 diagnosed in September 2020.Workup showed a 3 cm mass at the anorectal junction, biopsy confirmed well to moderately differentiated squamous cell carcinoma. 06/21/19 PET scan showed no metastasis.  -S/p concurrent chemoRT with Mitomycin and 5FU on 08/09/19. Now on surveillance. -surveillance CT CAP 07/09/21 was NED. -surveillance colonoscopy done for rectal bleeding on 12/07/21 showed only radiation changes. Her rectal bleeding has resolved.   Recently patient presented to her primary care physician on 05/23/2022 with her new right neck lump which has been progressively enlarging.  She also subsequently developed a left neck swelling.  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   She has had a PET CT scan done on 06/19/2022 which showed hypermetabolic bilateral neck lymphadenopathy.  Also noted to have a new 1 cm focus of metabolic activity in segment 4 of the liver.  This is etiology indeterminate. Lumbar scoliosis and small supraumbilical hernia.   Patient has had Port-A-Cath placement done on 06/25/2022. Echocardiogram was done on 06/17/2022 and shows normal ejection fraction of 70 to 75% with grade 1 diastolic dysfunction.  Right ventricular systolic function within normal limits.   INTERVAL  HISTORY   Cynthia Velasquez is here today for evaluation and management of T-cell non-Hodgkin's lymphoma. She is here for follow-up prior to cycle 4 of CEOP. She has not had a PET CT scan yet but this is scheduled for  09/19/2021. She has seen Dr. Barbaraann Cao who feels that tingling and numbness in her fingers could be from chemotherapy or from carpal tunnel syndrome but likely from chemotherapy. Her vincristine was dose reduced for her last chemotherapy cycle and her neuropathy is stable.  She prefers to continue with the same dose of vincristine as opposed to holding this chemotherapy completely. No new infections or other new toxicities at this time No new lumps or bumps noted. No new abdominal pain or distention. Patient has received the influenza vaccine, COVID-19 Booster, and RSV vaccine.    Current chemotherapy: Cytoxan, Etoposide, vincristine, decadron   EtOH use: Rare  Restrictive diet? No Family history of neuropathy/myopathy/neurologic disease? Not aware, patient is adopted   Patient had an EMG in her 63s and told she had carpal tunnel back then (50 years ago).  12/20/22: Labs were significant for A1c of 6.3. Patient has not gotten her EMG yet due to being hospitalized. She was admitted from 10/28/22 to 11/05/22 for chronic infectious diarrhea, metabolic acidosis, UTI, and pancytopenia.   Patient continues to have a lot of symptoms in her feet and first two fingers of her hands. She has leg cramping as well. She has some leg weakness, particularly when trying to get up out of a chair. Patient is going to PT, which she enjoys. She thinks her strength is a work in process. Patient is taking Cymbalta 60 mg daily. She thinks it may be helping some. She does not have significant side effects.   Patient has the wrist splints that were recommended. She wears them at night. She has noticed some improvement in her hand symptoms.   Regarding her cancer, she has finished chemo for now. PET on 12/13/22 mentions recurrent hypermetabolic cervical lymphadenopathy concerning for recurrent disease. She is following up with oncology for this.   She has no new complaints.  Most recent Assessment and Plan (12/20/22): This is  Cynthia Velasquez, a 72 y.o. 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 Possible contribution from pre-diabetes as well Bilateral carpal tunnel syndrome Physical deconditioning   Plan: -Patient previously missed EMG appointment as she was hospitalized. We discussed today and patient would prefer to hold off rescheduling for now given that she has a lot going on and would like to focus on oncologic problems -Increase Cymbalta to 60 mg BID -Lidocaine cream PRN -Continue wrist splints at night -Continue PT  Since their last visit: Patient sent a message on 08/12/23 about worsening neuropathy. ***  Patient was admitted on 08/24/23 with failure to thrive and concern for worsening lymphoma.***  ROS: Pertinent positive and negative systems reviewed in HPI. ***   MEDICATIONS:  Facility-Administered Encounter Medications as of 09/05/2023  Medication   albuterol (PROVENTIL) (2.5 MG/3ML) 0.083% nebulizer solution 2.5 mg   Chlorhexidine Gluconate Cloth 2 % PADS 6 each   dextrose 5 %-0.9 % sodium chloride infusion   hydrALAZINE (APRESOLINE) injection 5 mg   HYDROmorphone (DILAUDID) injection 0.2-0.5 mg   methylPREDNISolone sodium succinate (SOLU-MEDROL) 125 mg/2 mL injection 125 mg   ondansetron (ZOFRAN) tablet 4 mg   Or   ondansetron (ZOFRAN) injection 4 mg   oxyCODONE (Oxy IR/ROXICODONE) immediate release tablet 2.5-5 mg   pantoprazole (PROTONIX) injection 40 mg   sodium chloride flush (  NS) 0.9 % injection 10-40 mL   traZODone (DESYREL) tablet 25 mg   Outpatient Encounter Medications as of 09/05/2023  Medication Sig   acyclovir (ZOVIRAX) 400 MG tablet Take 1 tablet (400 mg total) by mouth daily.   amLODipine (NORVASC) 5 MG tablet Take 1 tablet (5 mg total) by mouth daily.   dexamethasone (DECADRON) 4 MG tablet Take 2 tablets (8 mg total) by mouth daily. Start the day after bendamustine chemotherapy for 2 days. Take with food. (Patient not taking: Reported on 08/24/2023)    lidocaine-prilocaine (EMLA) cream Apply to affected area once (Patient taking differently: Apply 1 Application topically once. Apply to affected area once)   losartan (COZAAR) 100 MG tablet Take 100 mg by mouth daily.   magic mouthwash (multi-ingredient) oral suspension Take 5 mLs by mouth 3 (three) times daily as needed. (Patient not taking: Reported on 08/24/2023)   Magnesium 200 MG TABS Take 2 tablets (400 mg total) by mouth 2 (two) times daily.   methocarbamol (ROBAXIN) 750 MG tablet Take 1 tablet (750 mg total) by mouth every 8 (eight) hours as needed for muscle spasms.   ondansetron (ZOFRAN) 8 MG tablet Take 1 tablet (8 mg total) by mouth every 8 (eight) hours as needed for nausea or vomiting. Start on the third day after chemotherapy.   pantoprazole (PROTONIX) 40 MG tablet Take 1 tablet (40 mg total) by mouth 2 (two) times daily.   polyethylene glycol (MIRALAX / GLYCOLAX) 17 g packet Take 17 g by mouth daily.   potassium chloride SA (KLOR-CON M) 20 MEQ tablet Take 2 tablets (40 mEq total) by mouth daily for 10 days. (Patient not taking: Reported on 08/24/2023)   prochlorperazine (COMPAZINE) 10 MG tablet Take 1 tablet (10 mg total) by mouth every 6 (six) hours as needed for nausea or vomiting.   senna-docusate (SENOKOT-S) 8.6-50 MG tablet Take 1 tablet by mouth 2 (two) times daily.   sucralfate (CARAFATE) 1 g tablet Dissolve 1 tablet in 5-10ml of water twice a day and then swallow slurry.   triamcinolone ointment (KENALOG) 0.5 % Apply 1 Application topically 2 (two) times daily. To rash under both armpits (Patient taking differently: Apply 1 Application topically 2 (two) times daily as needed (Rash). To rash under both armpits)    PAST MEDICAL HISTORY: Past Medical History:  Diagnosis Date   Anal cancer (HCC) dx'd 05/2019   Anxiety    Arthritis    Bilateral breast cancer (HCC) 10/21/2013   Breast cancer (HCC)    Cancer (HCC)    Depression    Gallstones    GERD (gastroesophageal reflux  disease)    doesn't take any meds for this   Headache    h/o migraines       History of bladder infections    History of hiatal hernia    History of migraine    Hyperlipidemia    takes Simvastatin daily   Hypertension    takes Hyzaar   Joint pain    Joint swelling    Lumbar stenosis    Lymphoma (HCC)    NHL   Neuropathy 09/07/2013   Pneumonia    Pre-diabetes     PAST SURGICAL HISTORY: Past Surgical History:  Procedure Laterality Date   ABDOMINAL HYSTERECTOMY     bone spur removed from left foot     CHOLECYSTECTOMY     COLONOSCOPY     COLONOSCOPY WITH PROPOFOL Bilateral 12/07/2021   Procedure: COLONOSCOPY WITH PROPOFOL;  Surgeon: Vida Rigger, MD;  Location: [facility];  Service: Endoscopy;  Laterality: Bilateral;   double mastectomy      ESOPHAGOGASTRODUODENOSCOPY N/A 12/07/2021   Procedure: ESOPHAGOGASTRODUODENOSCOPY (EGD);  Surgeon: Vida Rigger, MD;  Location: [facility];  Service: Endoscopy;  Laterality: N/A;   HERNIA REPAIR     umbilical   IR IMAGING GUIDED PORT INSERTION  06/25/2022   right knee arthroscopy     right shoulder arthroscopy     surgery for hiatal hernia     TONSILLECTOMY     TOTAL HIP ARTHROPLASTY Left 02/12/2013   TOTAL HIP ARTHROPLASTY Left 02/12/2013   Procedure: LEFT TOTAL HIP ARTHROPLASTY;  Surgeon: Nestor Lewandowsky, MD;  Location: [facility];  Service: Orthopedics;  Laterality: Left;   TOTAL HIP ARTHROPLASTY Right 05/03/2013   Procedure: TOTAL HIP ARTHROPLASTY;  Surgeon: Nestor Lewandowsky, MD;  Location: [facility] OR;  Service: Orthopedics;  Laterality: Right;   TOTAL KNEE ARTHROPLASTY Right 02/27/2015   Procedure: TOTAL KNEE ARTHROPLASTY;  Surgeon: Gean Birchwood, MD;  Location: [facility];  Service: Orthopedics;  Laterality: Right;   TOTAL SHOULDER ARTHROPLASTY Right 09/12/2016   Procedure: RIGHT TOTAL SHOULDER ARTHROPLASTY;  Surgeon: Jones Broom, MD;  Location: [facility];  Service: Orthopedics;  Laterality: Right;  RIGHT TOTAL SHOULDER ARTHROPLASTY     ALLERGIES: Allergies  Allergen Reactions   Tramadol Itching and Rash   Belinostat Nausea And Vomiting    Headache and  nausea during infusion. See progress note from 02/24/23   Codeine Itching and Rash   Vicodin [Hydrocodone-Acetaminophen] Itching and Rash    FAMILY HISTORY: Family History  Adopted: Yes  Problem Relation Age of Onset   Allergic rhinitis Neg Hx    Angioedema Neg Hx    Atopy Neg Hx    Eczema Neg Hx    Immunodeficiency Neg Hx    Urticaria Neg Hx    Asthma Neg Hx     SOCIAL HISTORY: Social History   Tobacco Use   Smoking status: Never   Smokeless tobacco: Never  Vaping Use   Vaping status: Never Used  Substance Use Topics   Alcohol use: No    Comment: occ   Drug use: No   Social History   Social History Narrative   Are you right handed or left handed? Right   Are you currently employed ? no   What is your current occupation?retired   Do you live at home alone? no   Who lives with you? Daughter and granddaughter   What type of home do you live in: 1 story or 2 story? one   Caffeine 1 cup a day    Objective:  Vital Signs:  There were no vitals taken for this visit.  General:*** General appearance: Awake and alert. No distress. Cooperative with exam.  Skin: No obvious rash or jaundice. HEENT: Atraumatic. Anicteric. Lungs: Non-labored breathing on room air  Heart: Regular Abdomen: Soft, non tender. Extremities: No edema. No obvious deformity.  Musculoskeletal: No obvious joint swelling.  Neurological: Mental Status: Alert. Speech fluent. No pseudobulbar affect Cranial Nerves: CNII: No RAPD. Visual fields intact. CNIII, IV, VI: PERRL. No nystagmus. EOMI. CN V: Facial sensation intact bilaterally to fine touch. [place] clench strong. Jaw jerk***. CN VII: Facial muscles symmetric and strong. No ptosis at rest or after sustained upgaze***. CN VIII: Hears finger rub well bilaterally. CN IX: No hypophonia. CN X: Palate elevates  symmetrically. CN XI: Full strength shoulder shrug bilaterally. CN XII: Tongue protrusion full and midline. No atrophy or fasciculations. No significant dysarthria***  Motor: Tone is ***. *** fasciculations in *** extremities. *** atrophy. No grip or percussive myotonia.  Individual muscle group testing (MRC grade out of 5):  Movement     Neck flexion ***    Neck extension ***     Right Left   Shoulder abduction *** ***   Shoulder adduction *** ***   Shoulder ext rotation *** ***   Shoulder int rotation *** ***   Elbow flexion *** ***   Elbow extension *** ***   Wrist extension *** ***   Wrist flexion *** ***   Finger abduction - FDI *** ***   Finger abduction - ADM *** ***   Finger extension *** ***   Finger distal flexion - 2/3 *** ***   Finger distal flexion - 4/5 *** ***   Thumb flexion - FPL *** ***   Thumb abduction - APB *** ***    Hip flexion *** ***   Hip extension *** ***   Hip adduction *** ***   Hip abduction *** ***   Knee extension *** ***   Knee flexion *** ***   Dorsiflexion *** ***   Plantarflexion *** ***   Inversion *** ***   Eversion *** ***   Great toe extension *** ***   Great toe flexion *** ***     Reflexes:  Right Left  Bicep *** ***  Tricep *** ***  BrRad *** ***  Knee *** ***  Ankle *** ***   Pathological Reflexes: Babinski: *** response bilaterally*** Hoffman: *** Troemner: *** Pectoral: *** Palmomental: *** Facial: *** Midline tap: *** Sensation: Pinprick: *** Vibration: *** Temperature: *** Proprioception: *** Coordination: Intact finger-to- nose-finger and heel-to-shin bilaterally. Romberg negative.*** Gait: Able to rise from chair with arms crossed unassisted. Normal, narrow-based gait. Able to tandem walk. Able to walk on toes and heels.***   Lab and Test Review: New results: ***  Previously reviewed results: 10/11/22: TSH wnl IFE no M protein A1c: 6.3 B12: 963 B1: 9   10/01/22: Normal or unremarkable:  LDH, CMP CBC significant for anemia (Hb 10.6)   TSH (12/23/19): 1.578   Imaging: MRI lumbar spine (04/18/22): FINDINGS: Segmentation:  5 lumbar vertebrae.  Rudimentary S1-2 disc.   Alignment: Moderate upper lumbar dextroscoliosis. Unchanged grade 1 anterolisthesis of L3 on L4 and L4 on L5.   Vertebrae: No fracture or suspicious marrow lesion. Persistent mild right facet edema at L4-5.   Conus medullaris and cauda equina: Conus extends to the L1 level. Conus and cauda equina appear normal.   Paraspinal and other soft tissues: Small bilateral renal cysts.   Disc levels:   Disc desiccation and mild-to-moderate disc space narrowing throughout the lumbar and included lower thoracic spine.   T10-11: Only imaged sagittally. Mild disc bulging and moderate to severe right facet arthrosis result in mild-to-moderate right neural foraminal stenosis without spinal stenosis, unchanged.   T11-12: Only imaged sagittally. Disc bulging and moderate to severe right and moderate left facet arthrosis result in moderate to severe right neural foraminal stenosis without spinal stenosis, unchanged.   T12-L1: Disc bulging, a left paracentral to left subarticular disc protrusion, and moderate left greater than right facet hypertrophy result in mild left lateral recess stenosis and mild left neural foraminal stenosis without spinal stenosis, unchanged.   L1-2: Left eccentric disc bulging and moderate left greater than right facet and ligamentum flavum hypertrophy result in mild left lateral recess stenosis without spinal or neural foraminal stenosis, unchanged.   L2-3: Disc bulging, a left foraminal  disc protrusion, and moderate right and severe left facet and ligamentum flavum hypertrophy result in borderline spinal stenosis, mild left lateral recess stenosis, and moderate left neural foraminal stenosis, unchanged.   L3-4: Anterolisthesis with left eccentric bulging of uncovered disc, a left  foraminal disc protrusion, and severe facet and ligamentum flavum hypertrophy result in mild-to-moderate spinal stenosis, mild-to-moderate left greater than right lateral recess stenosis, and mild right and severe left neural foraminal stenosis, stable to minimally progressed. Potential left L3 nerve root impingement.   L4-5: Anterolisthesis with bulging uncovered disc and severe right greater than left facet and ligamentum flavum hypertrophy result in mild-to-moderate spinal stenosis, mild-to-moderate bilateral lateral recess stenosis, and the moderate bilateral neural foraminal stenosis, unchanged.   L5-S1: Bilateral facet ankylosis.  Mild disc bulging.  No stenosis.   IMPRESSION: Diffuse lumbar disc and facet degeneration without significant interval change resulting in mild-to-moderate spinal stenosis and up to severe neural foraminal stenosis as above.  ASSESSMENT: This is Jahmiya Mclain, a 72 y.o. female with:  ***  Plan: ***  Return to clinic in ***  Total time spent reviewing records, interview, history/exam, documentation, and coordination of care on day of encounter:  *** min  Jacquelyne Balint, MD

## 2023-08-28 ENCOUNTER — Inpatient Hospital Stay (HOSPITAL_COMMUNITY): Payer: PPO

## 2023-08-28 DIAGNOSIS — G9341 Metabolic encephalopathy: Secondary | ICD-10-CM | POA: Diagnosis not present

## 2023-08-28 LAB — COMPREHENSIVE METABOLIC PANEL
ALT: 84 U/L — ABNORMAL HIGH (ref 0–44)
ALT: 76 U/L — ABNORMAL HIGH (ref 0–44)
AST: 113 U/L — ABNORMAL HIGH (ref 15–41)
AST: 95 U/L — ABNORMAL HIGH (ref 15–41)
Albumin: 2.2 g/dL — ABNORMAL LOW (ref 3.5–5.0)
Albumin: 2.3 g/dL — ABNORMAL LOW (ref 3.5–5.0)
Alkaline Phosphatase: 301 U/L — ABNORMAL HIGH (ref 38–126)
Alkaline Phosphatase: 287 U/L — ABNORMAL HIGH (ref 38–126)
Anion gap: 7 (ref 5–15)
Anion gap: 5 (ref 5–15)
BUN: 65 mg/dL — ABNORMAL HIGH (ref 8–23)
BUN: 62 mg/dL — ABNORMAL HIGH (ref 8–23)
CO2: 20 mmol/L — ABNORMAL LOW (ref 22–32)
CO2: 19 mmol/L — ABNORMAL LOW (ref 22–32)
Calcium: 9.1 mg/dL (ref 8.9–10.3)
Calcium: 8.3 mg/dL — ABNORMAL LOW (ref 8.9–10.3)
Chloride: 113 mmol/L — ABNORMAL HIGH (ref 98–111)
Chloride: 117 mmol/L — ABNORMAL HIGH (ref 98–111)
Creatinine, Ser: 2.02 mg/dL — ABNORMAL HIGH (ref 0.44–1.00)
Creatinine, Ser: 2.05 mg/dL — ABNORMAL HIGH (ref 0.44–1.00)
GFR, Estimated: 26 mL/min — ABNORMAL LOW (ref >60)
GFR, Estimated: 25 mL/min — ABNORMAL LOW (ref >60)
Glucose, Bld: 161 mg/dL — ABNORMAL HIGH (ref 70–99)
Glucose, Bld: 182 mg/dL — ABNORMAL HIGH (ref 70–99)
Potassium: 3.2 mmol/L — ABNORMAL LOW (ref 3.5–5.1)
Potassium: 3.6 mmol/L (ref 3.5–5.1)
Sodium: 140 mmol/L (ref 135–145)
Sodium: 141 mmol/L (ref 135–145)
Total Bilirubin: 4.7 mg/dL — ABNORMAL HIGH (ref <1.2)
Total Bilirubin: 3.9 mg/dL — ABNORMAL HIGH (ref <1.2)
Total Protein: 4.6 g/dL — ABNORMAL LOW (ref 6.5–8.1)
Total Protein: 4.7 g/dL — ABNORMAL LOW (ref 6.5–8.1)

## 2023-08-28 LAB — GLUCOSE, CAPILLARY
Glucose-Capillary: 154 mg/dL — ABNORMAL HIGH (ref 70–99)
Glucose-Capillary: 155 mg/dL — ABNORMAL HIGH (ref 70–99)
Glucose-Capillary: 161 mg/dL — ABNORMAL HIGH (ref 70–99)
Glucose-Capillary: 161 mg/dL — ABNORMAL HIGH (ref 70–99)
Glucose-Capillary: 162 mg/dL — ABNORMAL HIGH (ref 70–99)
Glucose-Capillary: 164 mg/dL — ABNORMAL HIGH (ref 70–99)

## 2023-08-28 LAB — CBC WITH DIFFERENTIAL/PLATELET
Abs Immature Granulocytes: 0.01 10*3/uL (ref 0.00–0.07)
Basophils Absolute: 0 10*3/uL (ref 0.0–0.1)
Basophils Relative: 1 %
Eosinophils Absolute: 0 10*3/uL (ref 0.0–0.5)
Eosinophils Relative: 0 %
HCT: 30 % — ABNORMAL LOW (ref 36.0–46.0)
Hemoglobin: 10.1 g/dL — ABNORMAL LOW (ref 12.0–15.0)
Immature Granulocytes: 1 %
Lymphocytes Relative: 23 %
Lymphs Abs: 0.2 10*3/uL — ABNORMAL LOW (ref 0.7–4.0)
MCH: 29.2 pg (ref 26.0–34.0)
MCHC: 33.7 g/dL (ref 30.0–36.0)
MCV: 86.7 fL (ref 80.0–100.0)
Monocytes Absolute: 0.1 10*3/uL (ref 0.1–1.0)
Monocytes Relative: 14 %
Neutro Abs: 0.6 10*3/uL — ABNORMAL LOW (ref 1.7–7.7)
Neutrophils Relative %: 61 %
Platelets: 13 10*3/uL — CL (ref 150–400)
RBC: 3.46 MIL/uL — ABNORMAL LOW (ref 3.87–5.11)
RDW: 27.4 % — ABNORMAL HIGH (ref 11.5–15.5)
WBC: 1 10*3/uL — CL (ref 4.0–10.5)
nRBC: 2.1 % — ABNORMAL HIGH (ref 0.0–0.2)

## 2023-08-28 LAB — MAGNESIUM: Magnesium: 1.9 mg/dL (ref 1.7–2.4)

## 2023-08-28 MED ORDER — SENNOSIDES-DOCUSATE SODIUM 8.6-50 MG PO TABS
2.0000 | ORAL_TABLET | Freq: Two times a day (BID) | ORAL | Status: DC
  Administered 2023-08-28 – 2023-09-03 (×5): 2 via ORAL
  Filled 2023-08-28 (×11): qty 2

## 2023-08-28 MED ORDER — DEXTROSE-SODIUM CHLORIDE 5-0.9 % IV SOLN
INTRAVENOUS | Status: AC
  Administered 2023-08-28 – 2023-08-29 (×2): 125 mL/h via INTRAVENOUS

## 2023-08-28 MED ORDER — POTASSIUM CHLORIDE 10 MEQ/100ML IV SOLN
10.0000 meq | INTRAVENOUS | Status: AC
  Administered 2023-08-28 (×4): 10 meq via INTRAVENOUS
  Filled 2023-08-28 (×4): qty 100

## 2023-08-28 NOTE — Evaluation (Signed)
Clinical/Bedside Swallow Evaluation Patient Details  Name: Cynthia Velasquez MRN: [id number] Date of Birth: 22-Feb-1951  Today's Date: 08/28/2023 Time: SLP Start Time (ACUTE ONLY): 1435 SLP Stop Time (ACUTE ONLY): 1520 SLP Time Calculation (min) (ACUTE ONLY): 45 min  Past Medical History:  Past Medical History:  Diagnosis Date   Anal cancer (HCC) dx'd 05/2019   Anxiety    Arthritis    Bilateral breast cancer (HCC) 10/21/2013   Breast cancer (HCC)    Cancer (HCC)    Depression    Gallstones    GERD (gastroesophageal reflux disease)    doesn't take any meds for this   Headache    h/o migraines       History of bladder infections    History of hiatal hernia    History of migraine    Hyperlipidemia    takes Simvastatin daily   Hypertension    takes Hyzaar   Joint pain    Joint swelling    Lumbar stenosis    Lymphoma (HCC)    NHL   Neuropathy 09/07/2013   Pneumonia    Pre-diabetes    Past Surgical History:  Past Surgical History:  Procedure Laterality Date   ABDOMINAL HYSTERECTOMY     bone spur removed from left foot     CHOLECYSTECTOMY     COLONOSCOPY     COLONOSCOPY WITH PROPOFOL Bilateral 12/07/2021   Procedure: COLONOSCOPY WITH PROPOFOL;  Surgeon: Vida Rigger, MD;  Location: [facility];  Service: Endoscopy;  Laterality: Bilateral;   double mastectomy      ESOPHAGOGASTRODUODENOSCOPY N/A 12/07/2021   Procedure: ESOPHAGOGASTRODUODENOSCOPY (EGD);  Surgeon: Vida Rigger, MD;  Location: [facility];  Service: Endoscopy;  Laterality: N/A;   HERNIA REPAIR     umbilical   IR IMAGING GUIDED PORT INSERTION  06/25/2022   right knee arthroscopy     right shoulder arthroscopy     surgery for hiatal hernia     TONSILLECTOMY     TOTAL HIP ARTHROPLASTY Left 02/12/2013   TOTAL HIP ARTHROPLASTY Left 02/12/2013   Procedure: LEFT TOTAL HIP ARTHROPLASTY;  Surgeon: Nestor Lewandowsky, MD;  Location: [facility];  Service: Orthopedics;  Laterality: Left;   TOTAL HIP ARTHROPLASTY Right 05/03/2013    Procedure: TOTAL HIP ARTHROPLASTY;  Surgeon: Nestor Lewandowsky, MD;  Location: [facility];  Service: Orthopedics;  Laterality: Right;   TOTAL KNEE ARTHROPLASTY Right 02/27/2015   Procedure: TOTAL KNEE ARTHROPLASTY;  Surgeon: Gean Birchwood, MD;  Location: [facility];  Service: Orthopedics;  Laterality: Right;   TOTAL SHOULDER ARTHROPLASTY Right 09/12/2016   Procedure: RIGHT TOTAL SHOULDER ARTHROPLASTY;  Surgeon: Jones Broom, MD;  Location: [facility];  Service: Orthopedics;  Laterality: Right;  RIGHT TOTAL SHOULDER ARTHROPLASTY   HPI:  72 year old female with a past medical history of breast cancer, anal cancer, recent non-Hodgkin's lymphoma on chemotherapy, hypertension, and GERD who was admitted on 08/24/2023 for management of decreased oral intake and increased lethargy at home.  Patient had last received chemotherapy on 08/11/2023; 5 days has gotten progressively weaker, not been eating or drinking well, worsening abdominal pain, and worsening weakness.  thrombocytopenia, worsening liver failure, jaundice, and acute renal failure.  Imaging showing continued diffuse lymphadenopathy in setting of non-Hodgkin's lymphoma.  GI consulted for evaluation.  Patient is a 72 year old female with a past medical history of breast cancer, anal cancer, recent non-Hodgkin's lymphoma on chemotherapy, hypertension, and GERD who was admitted on 08/24/2023 for management of decreased oral intake and increased lethargy at home.  Patient had last  received chemotherapy on 08/11/2023; 5 days has gotten progressively weaker, not been eating or drinking well, worsening abdominal pain, and worsening weakness.  thrombocytopenia, worsening liver failure, jaundice, and acute renal failure.  Imaging showing continued diffuse lymphadenopathy in setting of non-Hodgkin's lymphoma.  GI consulted for evaluation.  12/9 Trace bilateral pleural effusions. Trace upper abdominal ascites. Body wall edema.  MD changed diet to dys1/nectar.    Assessment / Plan /  Recommendation  Clinical Impression  Patient currently is sleepy but attempts to participate with evaluation.  SLP set up suction and provided oral care using toothbrush and paste and oral suction.  She needed total cues to open her mouth adequately.  Patient then provided with various boluses and minimal amounts due to her mentation.  Teaspoons of thin, tip of straw of thin, single ice chip, teaspoon and cup bolus of Italian ice melted, nectar thick via teaspoon and cup.  Patient demonstrated prolonged oral holding requiring oral suction to remove nectar boluses despite verbal cues to swallow due to poor awareness. Cold temp improved her sensation - thus improved timing of swallow.  Patient is observed to have neck extension and does not maintain neutral position despite verbal cues and tactile assistance. At this time did not provide puree due to her mentation and poor awareness of boluses elevating aspiration risk.  Advise full liquids via tsp or from tip of straw *occlude straw to hold liquid and then place in mouth - releasing end of straw. Skilled SLP Included determing compensation strategies that were effective for efficient and safer po intake.   Anticipate her po intake will be poor due to her mentation - but will follow up for advancement readiness and family education. Daughter not present at time of visit. SLP Visit Diagnosis: Dysphagia, oral phase (R13.11)    Aspiration Risk  Moderate aspiration risk;Risk for inadequate nutrition/hydration    Diet Recommendation Thin liquid (full liquids)    Liquid Administration via: Spoon (TIP OF STRAW) Medication Administration: Via alternative means (or crushed with liquids) Supervision: Full supervision/cueing for compensatory strategies Compensations: Slow rate;Small sips/bites;Other (Comment) (assure swallows before giving more) Postural Changes: Seated upright at 90 degrees;Remain upright for at least 30 minutes after po intake    Other   Recommendations Oral Care Recommendations: Oral care BID Caregiver Recommendations: Have oral suction available    Recommendations for follow up therapy are one component of a multi-disciplinary discharge planning process, led by the attending physician.  Recommendations may be updated based on patient status, additional functional criteria and insurance authorization.  Follow up Recommendations No SLP follow up      Assistance Recommended at Discharge    Functional Status Assessment Patient has had a recent decline in their functional status and/or demonstrates limited ability to make significant improvements in function in a reasonable and predictable amount of time  Frequency and Duration min 1 x/week  1 week       Prognosis Prognosis for improved oropharyngeal function: Fair Barriers to Reach Goals: Time post onset;Other (Comment) (medical diagnosis)      Swallow Study   General Date of Onset: 08/28/23 HPI: 72 year old female with a past medical history of breast cancer, anal cancer, recent non-Hodgkin's lymphoma on chemotherapy, hypertension, and GERD who was admitted on 08/24/2023 for management of decreased oral intake and increased lethargy at home.  Patient had last received chemotherapy on 08/11/2023; 5 days has gotten progressively weaker, not been eating or drinking well, worsening abdominal pain, and worsening weakness.  thrombocytopenia, worsening liver  failure, jaundice, and acute renal failure.  Imaging showing continued diffuse lymphadenopathy in setting of non-Hodgkin's lymphoma.  GI consulted for evaluation.  Patient is a 72 year old female with a past medical history of breast cancer, anal cancer, recent non-Hodgkin's lymphoma on chemotherapy, hypertension, and GERD who was admitted on 08/24/2023 for management of decreased oral intake and increased lethargy at home.  Patient had last received chemotherapy on 08/11/2023; 5 days has gotten progressively weaker, not been eating  or drinking well, worsening abdominal pain, and worsening weakness.  thrombocytopenia, worsening liver failure, jaundice, and acute renal failure.  Imaging showing continued diffuse lymphadenopathy in setting of non-Hodgkin's lymphoma.  GI consulted for evaluation.  12/9 Trace bilateral pleural effusions. Trace upper abdominal ascites. Body wall edema.  MD changed diet to dys1/nectar. Type of Study: Bedside Swallow Evaluation Previous Swallow Assessment: none in the chart Diet Prior to this Study: Dysphagia 1 (pureed);Mildly thick liquids (Level 2, nectar thick) Temperature Spikes Noted: No Respiratory Status: Room air History of Recent Intubation: No Behavior/Cognition: Lethargic/Drowsy Oral Cavity Assessment: Dry Oral Care Completed by SLP: Yes (oral suction set up and used to clear mild viscous secretions) Oral Cavity - Dentition: Adequate natural dentition Self-Feeding Abilities: Total assist Patient Positioning: Upright in bed Baseline Vocal Quality: Low vocal intensity Volitional Cough: Cognitively unable to elicit Volitional Swallow: Unable to elicit    Oral/Motor/Sensory Function Overall Oral Motor/Sensory Function: Generalized oral weakness   Ice Chips Ice chips: Impaired Presentation: Spoon Pharyngeal Phase Impairments: Suspected delayed Swallow   Thin Liquid Thin Liquid: Impaired Presentation: Spoon;Straw;Self Fed;Cup Oral Phase Impairments: Reduced labial seal;Reduced lingual movement/coordination Oral Phase Functional Implications: Oral holding Pharyngeal  Phase Impairments: Suspected delayed Swallow    Nectar Thick Nectar Thick Liquid: Impaired Presentation: Cup;Spoon Oral Phase Impairments: Reduced labial seal;Reduced lingual movement/coordination;Poor awareness of bolus Oral phase functional implications: Oral holding Other Comments: pt did not orally transit nectar thick liquids and SlP removed via oral suction   Honey Thick Honey Thick Liquid: Not tested   Puree  Puree: Not tested (due to amount of oral holding with nectar requiring suction -)   Solid     Solid: Not tested Other Comments: due to AMS      Chales Abrahams 08/28/2023,3:52 PM  Rolena Infante, MS [facility] SLP Acute Rehab Services Office [phone]

## 2023-08-28 NOTE — Plan of Care (Signed)
  Problem: Education: Goal: Knowledge of General Education information will improve Description: Including pain rating scale, medication(s)/side effects and non-pharmacologic comfort measures Outcome: Progressing   Problem: Clinical Measurements: Goal: Ability to maintain clinical measurements within normal limits will improve Outcome: Progressing Goal: Diagnostic test results will improve Outcome: Progressing   Problem: Nutrition: Goal: Adequate nutrition will be maintained Outcome: Progressing   Problem: Elimination: Goal: Will not experience complications related to bowel motility Outcome: Progressing

## 2023-08-28 NOTE — Progress Notes (Signed)
[facility] Progress Note [name] ZOX:[id number] DOB: Feb 04, 1951 DOA: 08/24/2023  DOS: the patient was seen and examined on 08/28/2023  Brief hospital course: PMH multiple cancer history (breast cancer, anal cancer, most recently lymphoma), HTN, HLD, depression/anxiety, GERD. She presented with worsening lethargy and confusion with associated weakness. 11/25 received Bendamustine. Under the care of Dr. Candise Che from home health with Dr. Alphonsa Gin at [facility] Doing well after her chemotherapy at the end of November, seemed to have good energy, was still driving herself and even going to work.  However 1 week prior to admission had progressive weakness, has not been eating or drinking very much, has had worsening abdominal pain, and difficulty ambulating. On workup she was found to have hypercalcemia, worsened renal function, and elevated LFTs. Medical oncology, palliative care and GI were consulted on admission.   Assessment and Plan: Acute metabolic encephalopathy Presents with progressive confusion lethargy and weakness. Hypercalcemia, AKI, lymphoma worsening as well as failure to thrive. ?  Bendamustine side effect. Aggressively treat hypercalcemia and monitor. Mentation unchanged compared to yesterday. Will check MRI brain to rule out any acute abnormality.   Hypercalcemia of malignancy Corrected calcium 15 on 12/9 and improved to 13.2 on 12/10 (though no large improvement in mentation); initial symptoms included: generalized pain complaints, lethargy, confusion s/p zoledronic acid on 08/25/2023 Finish out 4 doses of calcitonin Continue fluids, calcium level improving.   Neutropenia and thrombocytopenia from chemotherapy S/p bendamustine on 11/25; nadir not til week 3 so might fall further or other etiology for drop currently On neutropenic precaution. Currently afebrile. Transfuse as needed for platelet count less than 10 or for hemoglobin less than 7.   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  They understand poor prognosis at this time   AKI (acute kidney injury) (HCC) Baseline serum creatinine 0.9 in June 2024. Since then has progressively worsened. Currently serum creatinine in 2s. Continue fluids   Counseling regarding advance care planning and goals of care Appreciate Palliative care consultation. Prognosis is poor given multiple malignancies and current presentation with poor p.o. intake, AKI and bicytopenia.  Most likely will require residential hospice.  But for now we will monitor  Hypokalemia. Currently being replaced.    Subjective: Unchanged lethargy.  Denies any complaint.  Able to tell me her name as well as where she is.  Able to me occasional commands.  Physical Exam: General: in Mild distress, No Rash Cardiovascular: S1 and S2 Present, No Murmur Respiratory: Good respiratory effort, Bilateral Air entry present. No Crackles, No wheezes Abdomen: Bowel Sound present, No tenderness Extremities: No edema Neuro: Alert and oriented x3, no new focal deficit  Data Reviewed: I have Reviewed nursing notes, Vitals, and Lab results. Since last encounter, pertinent lab results CBC and BMP   . I have ordered test including CBC and BMP  .  Ordered MRI brain.  Disposition: Status is: Inpatient Remains inpatient appropriate because: Need further workup and improvement in mentation  SCDs Start: 08/24/23 1205   Family Communication: No one at bedside Level of care: Med-Surg   Vitals:   08/27/23 1718 08/27/23 1946 08/28/23 0507 08/28/23 1241  BP: 116/64 (!) 129/57 (!) 137/90 (!) 140/82  Pulse: 65 70 69 69  Resp: 15 18 18 15   Temp: 98 F (36.7 C) 98.4 F (36.9 C) 98.8 F (37.1 C) 97.9 F (36.6  C)  TempSrc: Oral Oral Oral Oral  SpO2: 98% 96% 99% 99%  Weight:       Height:         Author: Lynden Oxford, MD 08/28/2023 5:48 PM  Please look on [email or web address] to find out who is on call.

## 2023-08-28 NOTE — Progress Notes (Signed)
Daily Progress Note   Patient Name: Cynthia Velasquez       Date: 08/28/2023 DOB: 1951-04-14  Age: 72 y.o. MRN#: [id number] Attending Physician: Rolly Salter, MD Primary Care Physician: [name], MD Admit Date: 08/24/2023 Length of Stay: 3 days  Reason for Consultation/Follow-up: Establishing goals of care  Subjective:   CC: Patient appears confused and lethargic when seen today.     Following up regarding complex medical decision making.  Subjective:  Reviewed EMR prior to presenting to bedside.    Objective:   Vital Signs:  BP (!) 140/82 (BP Location: Right Arm)   Pulse 69   Temp 97.9 F (36.6 C) (Oral)   Resp 15   Ht 5\' 5"  (1.651 m)   Wt 80.8 kg   SpO2 99%   BMI 29.65 kg/m   Physical Exam: General: In bed, lethargic, confused, opens eyes appears uncomfortable Cardiovascular: RRR Neuro: Lethargic, confused  Imaging: I personally reviewed recent imaging.   Assessment & Plan:   Assessment: Patient is a 72 year old female with a past medical history of breast cancer, anal cancer, and recent diagnosis of non-Hodgkin's lymphoma on chemotherapy, hypertension, and GERD who was admitted on 08/24/2023 for management of decreased oral intake and increased lethargy at home.  Patient had last received chemotherapy on 08/11/2023; under the care of oncologist Dr. Candise Che and coordination of care with [facility] oncology.  Patient's daughter arrived that patient had initially been doing well after chemotherapy though over the past 5 days has gotten progressively weaker, not been eating or drinking well, worsening abdominal pain, and worsening weakness.  Upon presentation to the ER, lab work showing evidence of leukopenia, thrombocytopenia, worsening liver failure, jaundice, and acute renal failure.  Imaging showing continued diffuse lymphadenopathy in setting of non-Hodgkin's lymphoma.  GI consulted for evaluation.  Oncology has been consulted for evaluation.  Palliative medicine team  consulted to assist with complex medical decision making.   Recommendations/Plan: # Complex medical decision making/goals of care:      -Patient unable to participate in complex medical decision making at that time.                                -  Code Status:  established as DNR/DNI with limited medical interventions.                               - # Symptom management                -Pain, acute in setting of non-Hodgkin's lymphoma                               -Continue IV Dilaudid to 0.2-0.5 mg every 3 hours as needed for severe pain                               -Continue oral oxycodone to 2.5-5 mg every 4 hours as needed   # Psycho-social/Spiritual Support:  - Support System: daughter   # Discharge Planning:  To Be Determined: Daughter wishes to continue reasonable medical interventions for now such as IV fluids and other measures for management of hypercalcemia.  However, I discussed with her in detail about residential hospice and comfort measures.  She has for a time  trial of current interventions for the next 24-48 hours so that she can discuss with her siblings in [place] and with other relatives about transferring to hospice towards the end of this hospitalization.  DNR/DNI has been established.  Discussed with: Dr. Allena Katz primary attending from [facility] services.  Discussed with him about patient's current condition and my conversations with the patient's daughter.  A time-limited trial of reasonable medical interventions continues.  Palliative team following peripherally for now, will reengage with the patient's daughter with regards to home with hospice versus residential hospice after time trial of current interventions is completed.  Thank you for allowing the palliative care team to participate in the care [name].  low MDM [name], MD  Palliative Care Provider PMT # [phone]  If patient remains symptomatic despite maximum doses, please call PMT at  [phone] between 0700 and 1900. Outside of these hours, please call attending, as PMT does not have night coverage.

## 2023-08-29 ENCOUNTER — Inpatient Hospital Stay: Payer: PPO | Admitting: Hematology

## 2023-08-29 ENCOUNTER — Inpatient Hospital Stay: Payer: PPO

## 2023-08-29 ENCOUNTER — Inpatient Hospital Stay (HOSPITAL_COMMUNITY): Payer: PPO

## 2023-08-29 DIAGNOSIS — G9341 Metabolic encephalopathy: Secondary | ICD-10-CM | POA: Diagnosis not present

## 2023-08-29 LAB — COMPREHENSIVE METABOLIC PANEL
ALT: 76 U/L — ABNORMAL HIGH (ref 0–44)
AST: 85 U/L — ABNORMAL HIGH (ref 15–41)
Albumin: 2.3 g/dL — ABNORMAL LOW (ref 3.5–5.0)
Alkaline Phosphatase: 290 U/L — ABNORMAL HIGH (ref 38–126)
Anion gap: 7 (ref 5–15)
BUN: 64 mg/dL — ABNORMAL HIGH (ref 8–23)
CO2: 18 mmol/L — ABNORMAL LOW (ref 22–32)
Calcium: 8.1 mg/dL — ABNORMAL LOW (ref 8.9–10.3)
Chloride: 116 mmol/L — ABNORMAL HIGH (ref 98–111)
Creatinine, Ser: 1.88 mg/dL — ABNORMAL HIGH (ref 0.44–1.00)
GFR, Estimated: 28 mL/min — ABNORMAL LOW (ref >60)
Glucose, Bld: 162 mg/dL — ABNORMAL HIGH (ref 70–99)
Potassium: 3.9 mmol/L (ref 3.5–5.1)
Sodium: 141 mmol/L (ref 135–145)
Total Bilirubin: 4.3 mg/dL — ABNORMAL HIGH (ref <1.2)
Total Protein: 4.8 g/dL — ABNORMAL LOW (ref 6.5–8.1)

## 2023-08-29 LAB — CBC WITH DIFFERENTIAL/PLATELET
Abs Immature Granulocytes: 0.01 10*3/uL (ref 0.00–0.07)
Basophils Absolute: 0 10*3/uL (ref 0.0–0.1)
Basophils Relative: 0 %
Eosinophils Absolute: 0 10*3/uL (ref 0.0–0.5)
Eosinophils Relative: 0 %
HCT: 32.8 % — ABNORMAL LOW (ref 36.0–46.0)
Hemoglobin: 10.5 g/dL — ABNORMAL LOW (ref 12.0–15.0)
Immature Granulocytes: 1 %
Lymphocytes Relative: 20 %
Lymphs Abs: 0.2 10*3/uL — ABNORMAL LOW (ref 0.7–4.0)
MCH: 28.7 pg (ref 26.0–34.0)
MCHC: 32 g/dL (ref 30.0–36.0)
MCV: 89.6 fL (ref 80.0–100.0)
Monocytes Absolute: 0.2 10*3/uL (ref 0.1–1.0)
Monocytes Relative: 16 %
Neutro Abs: 0.7 10*3/uL — ABNORMAL LOW (ref 1.7–7.7)
Neutrophils Relative %: 63 %
Platelets: 10 10*3/uL — CL (ref 150–400)
RBC: 3.66 MIL/uL — ABNORMAL LOW (ref 3.87–5.11)
RDW: 28 % — ABNORMAL HIGH (ref 11.5–15.5)
WBC: 1 10*3/uL — CL (ref 4.0–10.5)
nRBC: 0 % (ref 0.0–0.2)

## 2023-08-29 LAB — MAGNESIUM: Magnesium: 2 mg/dL (ref 1.7–2.4)

## 2023-08-29 LAB — GLUCOSE, CAPILLARY
Glucose-Capillary: 129 mg/dL — ABNORMAL HIGH (ref 70–99)
Glucose-Capillary: 142 mg/dL — ABNORMAL HIGH (ref 70–99)
Glucose-Capillary: 154 mg/dL — ABNORMAL HIGH (ref 70–99)
Glucose-Capillary: 154 mg/dL — ABNORMAL HIGH (ref 70–99)
Glucose-Capillary: 155 mg/dL — ABNORMAL HIGH (ref 70–99)
Glucose-Capillary: 169 mg/dL — ABNORMAL HIGH (ref 70–99)

## 2023-08-29 MED ORDER — SODIUM CHLORIDE 0.9% IV SOLUTION
Freq: Once | INTRAVENOUS | Status: AC
  Administered 2023-08-29: 50 mL via INTRAVENOUS

## 2023-08-29 MED ORDER — BOOST / RESOURCE BREEZE PO LIQD CUSTOM
1.0000 | Freq: Three times a day (TID) | ORAL | Status: DC
  Administered 2023-08-29 – 2023-08-30 (×4): 237 mL via ORAL
  Administered 2023-08-31 – 2023-09-08 (×9): 1 via ORAL

## 2023-08-29 NOTE — Plan of Care (Signed)

## 2023-08-29 NOTE — Plan of Care (Signed)

## 2023-08-29 NOTE — Progress Notes (Signed)
Chaplain visits at request of Chaplain Claussen. Pt is accompanied by two family members, who share that she seems to be improving. Chaplain provides encouragement and support.

## 2023-08-29 NOTE — Progress Notes (Signed)
[facility] Progress Note [name] ZOX:[id number] DOB: Mar 30, 1951 DOA: 08/24/2023  DOS: the patient was seen and examined on 08/29/2023  Brief hospital course: PMH multiple cancer history (breast cancer, anal cancer, most recently lymphoma), HTN, HLD, depression/anxiety, GERD. She presented with worsening lethargy and confusion with associated weakness. 11/25 received Bendamustine. Under the care of Dr. Candise Che from home health with Dr. Alphonsa Gin at [facility] Doing well after her chemotherapy at the end of November, seemed to have good energy, was still driving herself and even going to work.  However 1 week prior to admission had progressive weakness, has not been eating or drinking very much, has had worsening abdominal pain, and difficulty ambulating. On workup she was found to have hypercalcemia, worsened renal function, and elevated LFTs. Medical oncology, palliative care and GI were consulted on admission.   Assessment and Plan: Acute metabolic encephalopathy Presents with progressive confusion lethargy and weakness. Hypercalcemia, AKI, lymphoma worsening as well as failure to thrive. ?  Bendamustine side effect. Aggressively treat hypercalcemia and monitor. To me her mentation is unchanged compared to yesterday. MI brain negative for any acute stroke or any other acute abnormality. Per daughter her mentation actually improving.  Will monitor.   Hypercalcemia of malignancy Corrected calcium 15 on 12/9 and improved to 13.2 on 12/10 (though no large improvement in mentation); initial symptoms included: generalized pain complaints, lethargy, confusion s/p zoledronic acid on 08/25/2023 Finish out 4 doses of calcitonin Will hold fluids, calcium level improving.   Neutropenia and thrombocytopenia from chemotherapy S/p bendamustine on 11/25; nadir not til week 3 so might fall further or other etiology for drop currently On neutropenic precaution. Currently  afebrile. Received 1 platelet transfusion on 12/13. 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  They understand poor prognosis at this time   AKI (acute kidney injury) (HCC) Baseline serum creatinine 0.9 in June 2024. Since then has progressively worsened. Currently serum creatinine in 2s. Continue fluids   Counseling regarding advance care planning and goals of care Appreciate Palliative care consultation. Prognosis is poor given multiple malignancies and current presentation with poor p.o. intake, AKI and bicytopenia.  Most likely will require residential hospice.  Discussed with daughter on 12/13.  Recommended transition to comfort care.  Daughter think that the patient is more lucid and actually ate couple of more bites which is more than what she has done in last few days and therefore she wants to continue with current care and treat what is treatable within the limits of DNR. Will monitor.  Hypokalemia. Currently being replaced.    Subjective: Minimal oral intake.  Unchanged appearance.  No nausea or vomiting.  Reports actually abdominal pain.  Physical Exam: In moderate distress.  No rash.  Morning. S1-S2 present. Bowel sound present.  Diffuse abdominal tenderness. Trace edema.  Data Reviewed: I have Reviewed nursing notes, Vitals, and Lab results. Reviewed CBC and CMP.  Reviewed her CBC and CMP.  Reviewed MRI brain.  Disposition: Status is: Inpatient Remains inpatient appropriate because: Monitor for clarity on goals of care.  SCDs Start: 08/24/23 1205   Family Communication: No one at bedside Level of care: Med-Surg   Vitals:   08/29/23 0428 08/29/23 0649 08/29/23 0705 08/29/23 1240  BP: 133/77 (!) 148/85 (!) 148/71 (!) 147/83  Pulse: [name] Kitchen)  59 64 63 68  Resp: 18 20 17 16   Temp: 97.8 F (36.6 C) 97.8 F (36.6 C) 97.7 F (36.5 C) 98 F (36.7 C)  TempSrc: Oral Oral Oral Oral  SpO2: 100% 100% 100% 100%  Weight:      Height:         Author: Lynden Oxford, MD 08/29/2023 7:15 PM  Please look on [email or web address] to find out who is on call.

## 2023-08-29 NOTE — Consult Note (Signed)
[facility] Liaison Note  08/29/2023  Cynthia Velasquez [birth year] [id number]  Covering [facility] Liaison)  Location: [facility] Liaison screened the patient remotely at [facility].  Insurance: [employer]   Cynthia Velasquez is a 72 y.o. female who is a Primary Care Patient of Cynthia Hazel, MD -[facility].The patient was screened for 30 day readmission hospitalization with noted extreme risk score for unplanned readmission risk with 2 IP/2 ED in 6 months.  The patient was assessed for potential Care Management service needs for post hospital transition for care coordination. Review of patient's electronic medical record reveals patient was admitted for Acute Metabolic Encephalopathy. This pt is an [facility] pt for post op transition of care follow up needs.    [facility] Care Management/Population Health does not replace or interfere with any arrangements made by the Inpatient Transition of Care team.   For questions contact:   Cynthia Cousin, RN, [facility] Liaison [place]   [facility], Population Health Office Hours MTWF  8:00 am-6:00 pm Direct Dial: [phone] mobile [phone] [Office toll free line] Office Hours are M-F 8:30 - 5 pm [email or web address]

## 2023-08-30 ENCOUNTER — Inpatient Hospital Stay (HOSPITAL_COMMUNITY): Payer: PPO

## 2023-08-30 DIAGNOSIS — G9341 Metabolic encephalopathy: Secondary | ICD-10-CM | POA: Diagnosis not present

## 2023-08-30 LAB — CBC WITH DIFFERENTIAL/PLATELET
Abs Immature Granulocytes: 0 10*3/uL (ref 0.00–0.07)
Basophils Absolute: 0 10*3/uL (ref 0.0–0.1)
Basophils Relative: 3 %
Eosinophils Absolute: 0 10*3/uL (ref 0.0–0.5)
Eosinophils Relative: 0 %
HCT: 32.4 % — ABNORMAL LOW (ref 36.0–46.0)
Hemoglobin: 10.3 g/dL — ABNORMAL LOW (ref 12.0–15.0)
Immature Granulocytes: 0 %
Lymphocytes Relative: 18 %
Lymphs Abs: 0.2 10*3/uL — ABNORMAL LOW (ref 0.7–4.0)
MCH: 29 pg (ref 26.0–34.0)
MCHC: 31.8 g/dL (ref 30.0–36.0)
MCV: 91.3 fL (ref 80.0–100.0)
Monocytes Absolute: 0.2 10*3/uL (ref 0.1–1.0)
Monocytes Relative: 19 %
Neutro Abs: 0.5 10*3/uL — ABNORMAL LOW (ref 1.7–7.7)
Neutrophils Relative %: 60 %
Platelets: 10 10*3/uL — CL (ref 150–400)
RBC: 3.55 MIL/uL — ABNORMAL LOW (ref 3.87–5.11)
RDW: 27.9 % — ABNORMAL HIGH (ref 11.5–15.5)
WBC: 0.9 10*3/uL — CL (ref 4.0–10.5)
nRBC: 0 % (ref 0.0–0.2)

## 2023-08-30 LAB — GLUCOSE, CAPILLARY
Glucose-Capillary: 122 mg/dL — ABNORMAL HIGH (ref 70–99)
Glucose-Capillary: 124 mg/dL — ABNORMAL HIGH (ref 70–99)
Glucose-Capillary: 125 mg/dL — ABNORMAL HIGH (ref 70–99)
Glucose-Capillary: 126 mg/dL — ABNORMAL HIGH (ref 70–99)
Glucose-Capillary: 133 mg/dL — ABNORMAL HIGH (ref 70–99)
Glucose-Capillary: 135 mg/dL — ABNORMAL HIGH (ref 70–99)

## 2023-08-30 LAB — COMPREHENSIVE METABOLIC PANEL
ALT: 66 U/L — ABNORMAL HIGH (ref 0–44)
AST: 70 U/L — ABNORMAL HIGH (ref 15–41)
Albumin: 2.4 g/dL — ABNORMAL LOW (ref 3.5–5.0)
Alkaline Phosphatase: 262 U/L — ABNORMAL HIGH (ref 38–126)
Anion gap: 5 (ref 5–15)
BUN: 64 mg/dL — ABNORMAL HIGH (ref 8–23)
CO2: 19 mmol/L — ABNORMAL LOW (ref 22–32)
Calcium: 7.5 mg/dL — ABNORMAL LOW (ref 8.9–10.3)
Chloride: 120 mmol/L — ABNORMAL HIGH (ref 98–111)
Creatinine, Ser: 1.83 mg/dL — ABNORMAL HIGH (ref 0.44–1.00)
GFR, Estimated: 29 mL/min — ABNORMAL LOW (ref >60)
Glucose, Bld: 122 mg/dL — ABNORMAL HIGH (ref 70–99)
Potassium: 3.5 mmol/L (ref 3.5–5.1)
Sodium: 144 mmol/L (ref 135–145)
Total Bilirubin: 3.7 mg/dL — ABNORMAL HIGH (ref <1.2)
Total Protein: 4.8 g/dL — ABNORMAL LOW (ref 6.5–8.1)

## 2023-08-30 LAB — CBC
HCT: 32.3 % — ABNORMAL LOW (ref 36.0–46.0)
Hemoglobin: 10.2 g/dL — ABNORMAL LOW (ref 12.0–15.0)
MCH: 29.1 pg (ref 26.0–34.0)
MCHC: 31.6 g/dL (ref 30.0–36.0)
MCV: 92 fL (ref 80.0–100.0)
Platelets: 21 10*3/uL — CL (ref 150–400)
RBC: 3.51 MIL/uL — ABNORMAL LOW (ref 3.87–5.11)
RDW: 27.6 % — ABNORMAL HIGH (ref 11.5–15.5)
WBC: 1 10*3/uL — CL (ref 4.0–10.5)
nRBC: 0 % (ref 0.0–0.2)

## 2023-08-30 LAB — LACTIC ACID, PLASMA
Lactic Acid, Venous: 2.3 mmol/L (ref 0.5–1.9)
Lactic Acid, Venous: 1.8 mmol/L (ref 0.5–1.9)

## 2023-08-30 LAB — MAGNESIUM: Magnesium: 2 mg/dL (ref 1.7–2.4)

## 2023-08-30 MED ORDER — SODIUM CHLORIDE 0.9 % IV BOLUS: 500.0000 mL | Freq: Once | INTRAVENOUS | Status: DC

## 2023-08-30 MED ORDER — SODIUM CHLORIDE 0.9% IV SOLUTION: Freq: Once | INTRAVENOUS | Status: DC

## 2023-08-30 MED ORDER — PANTOPRAZOLE SODIUM 40 MG IV SOLR
40.0000 mg | Freq: Every day | INTRAVENOUS | Status: DC
Start: 2023-08-30 — End: 2023-09-01
  Administered 2023-08-30 – 2023-08-31 (×2): 40 mg via INTRAVENOUS
  Filled 2023-08-30 (×2): qty 10

## 2023-08-30 MED ORDER — PANTOPRAZOLE SODIUM 40 MG PO TBEC
40.0000 mg | DELAYED_RELEASE_TABLET | Freq: Every day | ORAL | Status: DC
  Filled 2023-08-30: qty 1

## 2023-08-30 MED ORDER — LACTATED RINGERS IV SOLN: INTRAVENOUS | Status: AC

## 2023-08-30 MED ORDER — IOHEXOL 300 MG/ML  SOLN: 30.0000 mL | Freq: Once | INTRAMUSCULAR | Status: DC | PRN

## 2023-08-30 NOTE — Progress Notes (Signed)
SLP Cancellation Note  Patient Details Name: Cynthia Velasquez MRN: [id number] DOB: 03/05/1951   Cancelled treatment:       Reason Eval/Treat Not Completed: (P) Other (comment) (Rn) informed SLP that pt has remained lethargic.    Rolena Infante, MS [facility] SLP Acute Rehab Services Office [phone]    Chales Abrahams 08/30/2023, 5:21 PM

## 2023-08-30 NOTE — Progress Notes (Signed)
[facility] Progress Note [name] NFA:[id number] DOB: Oct 10, 1950 DOA: 08/24/2023  DOS: the patient was seen and examined on 08/30/2023  Brief hospital course: PMH multiple cancer history (breast cancer, anal cancer, most recently lymphoma), HTN, HLD, depression/anxiety, GERD. She presented with worsening lethargy and confusion with associated weakness. 11/25 received Bendamustine. Under the care of Dr. Candise Che from home health with Dr. Alphonsa Gin at [facility] Doing well after her chemotherapy at the end of November, seemed to have good energy, was still driving herself and even going to work.  However 1 week prior to admission had progressive weakness, has not been eating or drinking very much, has had worsening abdominal pain, and difficulty ambulating. On workup she was found to have hypercalcemia, AKI and elevated LFTs. Medical oncology, palliative care and GI were consulted on admission.   Assessment and Plan: Acute metabolic encephalopathy Presents with progressive confusion lethargy and weakness. Hypercalcemia, AKI, lymphoma worsening as well as failure to thrive. ?  Bendamustine side effect. Hypercalcemia is now corrected, patient is conversing and answering questions appropriately but patient's mentation has not improved significantly. MRI brain without contrast negative for any acute stroke or any other acute abnormality.  Unable to perform contrast due to CKD. Daughter is hopeful that the mentation is actually improving although it appears unchanged without any progression in positive direction.   Hypercalcemia of malignancy Corrected calcium 15 on 12/9 and improved to 13.2 on 12/10  Initial symptoms included: generalized pain complaints, lethargy, confusion s/p zoledronic acid on 08/25/2023 and 4 doses of calcitonin IV fluids were held.  Will resume.   Neutropenia and thrombocytopenia from chemotherapy S/p bendamustine on 11/25; nadir not til week 3 so might fall  further or other etiology for drop currently On neutropenic precaution. Currently afebrile. Received 1 platelet transfusion on 12/13 and another 1 on 12/14. Transfuse for hemoglobin less than 7 and platelet less than 10   Elevated liver enzymes Due to overall progression of peripheral T-cell lymphoma involving the abdomen recently seen by GI. MRCP negative for signs of obstruction.  Currently receiving IV Solu-Medrol to reduce hepatic burden of the lymphoma and it appears that LFTs are improving including bilirubin. Still no significant improvement in patient's oral intake or mentation.   AKI on CKD stage IIIb. Serum creatinine 0.9, June 2024.  GFR 46 in July 2024. GFR has been in 30s in October.  Serum creatinine has trended up to 1.2-1.6 throughout October and November. Currently serum creatinine in 2s. Treated with IV fluid with some improvement.  Will resume IV fluid and monitor.  Abdominal pain. Patient reports abdominal pain with poor p.o. intake. LFTs are already been elevated as well as creatinine has been elevated. Patient has been on high-dose steroids since admission. Concern is gastritis versus any other acute abnormality. Patient had an x-ray on 12/13 which does not show any acute abnormality. Unable to perform contrast studies but will perform a CT scan without contrast to understand the etiology of her severe abdominal pain. CT abdomen with contrast on 12/8 showed diffuse lymphadenopathy minimally decreased in size.   Counseling regarding advance care planning and goals of care Appreciate Palliative care consultation. Prognosis is poor given multiple malignancies and current presentation with poor p.o. intake, AKI and bicytopenia with altered mental status which has not improved despite improvement in hypercalcemia and AKI. At present daughter's goal is to treat what is treatable. Patient remains DNR/DNI. Patient appears to be failing to thrive. Ongoing abdominal pain  as well as encephalopathy despite  improvement in labs. On 12/13 recommended daughter to consider transition to complete comfort as well as consideration for residential hospice mother daughter not ready yet. Will continue to support and monitor.  Hypokalemia. Replaced  HTN. On losartan, amlodipine.  Currently on hold.  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 Received Bendamustine 11/25. Since then has increasing weakness and failure to thrive. Per [facility] oncology that are unlikely to be curative options for her lymphoma. No clinical trial options available. Recommendation is to consider hospice and palliative care if she experiences disease progression or side effect. Currently receiving IV Solu-Medrol.   Subjective: Continues to complain of abdominal pain.  No nausea no vomiting.  Able to answer questions although unable to follow commands consistently.  Physical Exam: General: in severe distress, No Rash Cardiovascular: S1 and S2 Present, No Murmur Respiratory: Good respiratory effort, Bilateral Air entry present. No Crackles, No wheezes Abdomen: Bowel Sound present, upper abdominal tenderness Extremities: No edema Neuro: Alert and oriented to self only, no new focal deficit, unable to follow commands  Data Reviewed: I have Reviewed nursing notes, Vitals, and Lab results. Since last encounter, pertinent lab results CBC and CMP   . I have ordered test including CBC and CMP  . I have ordered imaging CT abdomen  .   Disposition: Status is: Inpatient Remains inpatient appropriate because: Monitor for improvement in abdominal pain symptoms  SCDs Start: 08/24/23 1205   Family Communication: Family at bedside Level of care: Med-Surg   Vitals:   08/30/23 0624 08/30/23 0643 08/30/23 0908 08/30/23 0930   BP: (!) 141/74 116/64  (!) 140/73  Pulse: 71 61  64  Resp: 18 18  16   Temp: (!) 97.5 F (36.4 C) 97.7 F (36.5 C)  (!) 97.4 F (36.3 C)  TempSrc: Oral   Axillary  SpO2: 99% 97% 100% 100%  Weight:      Height:         Author: Lynden Oxford, MD 08/30/2023 5:51 PM  Please look on [email or web address] to find out who is on call.

## 2023-08-30 NOTE — Plan of Care (Signed)

## 2023-08-31 DIAGNOSIS — G9341 Metabolic encephalopathy: Secondary | ICD-10-CM | POA: Diagnosis not present

## 2023-08-31 LAB — CBC WITH DIFFERENTIAL/PLATELET
Abs Immature Granulocytes: 0.06 10*3/uL (ref 0.00–0.07)
Basophils Absolute: 0 10*3/uL (ref 0.0–0.1)
Basophils Relative: 2 %
Eosinophils Absolute: 0 10*3/uL (ref 0.0–0.5)
Eosinophils Relative: 0 %
HCT: 32.4 % — ABNORMAL LOW (ref 36.0–46.0)
Hemoglobin: 10.4 g/dL — ABNORMAL LOW (ref 12.0–15.0)
Immature Granulocytes: 6 %
Lymphocytes Relative: 17 %
Lymphs Abs: 0.2 10*3/uL — ABNORMAL LOW (ref 0.7–4.0)
MCH: 28.9 pg (ref 26.0–34.0)
MCHC: 32.1 g/dL (ref 30.0–36.0)
MCV: 90 fL (ref 80.0–100.0)
Monocytes Absolute: 0.1 10*3/uL (ref 0.1–1.0)
Monocytes Relative: 9 %
Neutro Abs: 0.7 10*3/uL — ABNORMAL LOW (ref 1.7–7.7)
Neutrophils Relative %: 66 %
Platelets: 11 10*3/uL — CL (ref 150–400)
RBC: 3.6 MIL/uL — ABNORMAL LOW (ref 3.87–5.11)
RDW: 28.1 % — ABNORMAL HIGH (ref 11.5–15.5)
WBC: 1 10*3/uL — CL (ref 4.0–10.5)
nRBC: 0 % (ref 0.0–0.2)

## 2023-08-31 LAB — COMPREHENSIVE METABOLIC PANEL
ALT: 63 U/L — ABNORMAL HIGH (ref 0–44)
AST: 64 U/L — ABNORMAL HIGH (ref 15–41)
Albumin: 2.5 g/dL — ABNORMAL LOW (ref 3.5–5.0)
Alkaline Phosphatase: 246 U/L — ABNORMAL HIGH (ref 38–126)
Anion gap: 6 (ref 5–15)
BUN: 63 mg/dL — ABNORMAL HIGH (ref 8–23)
CO2: 20 mmol/L — ABNORMAL LOW (ref 22–32)
Calcium: 7.4 mg/dL — ABNORMAL LOW (ref 8.9–10.3)
Chloride: 119 mmol/L — ABNORMAL HIGH (ref 98–111)
Creatinine, Ser: 1.49 mg/dL — ABNORMAL HIGH (ref 0.44–1.00)
GFR, Estimated: 37 mL/min — ABNORMAL LOW (ref >60)
Glucose, Bld: 115 mg/dL — ABNORMAL HIGH (ref 70–99)
Potassium: 3.1 mmol/L — ABNORMAL LOW (ref 3.5–5.1)
Sodium: 145 mmol/L (ref 135–145)
Total Bilirubin: 4.1 mg/dL — ABNORMAL HIGH (ref <1.2)
Total Protein: 5 g/dL — ABNORMAL LOW (ref 6.5–8.1)

## 2023-08-31 LAB — GLUCOSE, CAPILLARY
Glucose-Capillary: 104 mg/dL — ABNORMAL HIGH (ref 70–99)
Glucose-Capillary: 116 mg/dL — ABNORMAL HIGH (ref 70–99)
Glucose-Capillary: 122 mg/dL — ABNORMAL HIGH (ref 70–99)
Glucose-Capillary: 129 mg/dL — ABNORMAL HIGH (ref 70–99)
Glucose-Capillary: 136 mg/dL — ABNORMAL HIGH (ref 70–99)
Glucose-Capillary: 156 mg/dL — ABNORMAL HIGH (ref 70–99)
Glucose-Capillary: 95 mg/dL (ref 70–99)

## 2023-08-31 LAB — LIPASE, BLOOD: Lipase: 34 U/L (ref 11–51)

## 2023-08-31 MED ORDER — FLUCONAZOLE 100 MG PO TABS
100.0000 mg | ORAL_TABLET | Freq: Every day | ORAL | Status: DC
  Administered 2023-08-31 – 2023-09-09 (×7): 100 mg via ORAL
  Filled 2023-08-31 (×11): qty 1

## 2023-08-31 MED ORDER — POTASSIUM CHLORIDE 20 MEQ PO PACK
40.0000 meq | PACK | Freq: Once | ORAL | Status: AC
  Administered 2023-08-31: 40 meq via ORAL
  Filled 2023-08-31: qty 2

## 2023-08-31 MED ORDER — NYSTATIN 100000 UNIT/ML MT SUSP
5.0000 mL | Freq: Once | OROMUCOSAL | Status: AC
  Administered 2023-08-31: 500000 [IU] via ORAL
  Filled 2023-08-31: qty 5

## 2023-08-31 MED ORDER — FLUCONAZOLE 150 MG PO TABS
150.0000 mg | ORAL_TABLET | Freq: Every day | ORAL | Status: DC
  Filled 2023-08-31: qty 1

## 2023-08-31 NOTE — Progress Notes (Signed)
Speech Language Pathology Treatment: Dysphagia  Patient Details Name: Cynthia Velasquez MRN: [id number] DOB: 1951-08-16 Today's Date: 08/31/2023 Time: 9518-8416 SLP Time Calculation (min) (ACUTE ONLY): 15 min  Assessment / Plan / Recommendation Clinical Impression  Patient seen by SLP for skilled treatment focused on dysphagia goals. She was awake and alert but required moderate amount of cues in order to maintain attention when consuming PO's. She was able to indicate she wanted to drink some water. She was able to hold the cup and sip water through straw, however she required tactile and some hand over hand cues/assist to initiate this most of the time. She exhibited some suspected oral holding and suspected swallow initiation delay that appeared cognitive-based. No overt s/s aspiration observed. SLP recommending to continue on full liquids/thin consistency at this time. Swallow sign updated to reflect changes of recommendations to now allow for cup and/or straw sips. SLP will continue to follow.   HPI HPI: 72 year old female with a past medical history of breast cancer, anal cancer, recent non-Hodgkin's lymphoma on chemotherapy, hypertension, and GERD who was admitted on 08/24/2023 for management of decreased oral intake and increased lethargy at home.  Patient had last received chemotherapy on 08/11/2023; 5 days has gotten progressively weaker, not been eating or drinking well, worsening abdominal pain, and worsening weakness.  thrombocytopenia, worsening liver failure, jaundice, and acute renal failure.  Imaging showing continued diffuse lymphadenopathy in setting of non-Hodgkin's lymphoma.  GI consulted for evaluation.  Patient is a 72 year old female with a past medical history of breast cancer, anal cancer, recent non-Hodgkin's lymphoma on chemotherapy, hypertension, and GERD who was admitted on 08/24/2023 for management of decreased oral intake and increased lethargy at home.  Patient had last  received chemotherapy on 08/11/2023; 5 days has gotten progressively weaker, not been eating or drinking well, worsening abdominal pain, and worsening weakness.  thrombocytopenia, worsening liver failure, jaundice, and acute renal failure.  Imaging showing continued diffuse lymphadenopathy in setting of non-Hodgkin's lymphoma.  GI consulted for evaluation.  12/9 Trace bilateral pleural effusions. Trace upper abdominal ascites. Body wall edema.  MD changed diet to dys1/nectar.      SLP Plan  Continue with current plan of care      Recommendations for follow up therapy are one component of a multi-disciplinary discharge planning process, led by the attending physician.  Recommendations may be updated based on patient status, additional functional criteria and insurance authorization.    Recommendations  Diet recommendations: Thin liquid;Other(comment) (full liquid) Liquids provided via: Cup;Straw Medication Administration: Crushed with puree Supervision: Full supervision/cueing for compensatory strategies;Staff to assist with self feeding Compensations: Slow rate;Small sips/bites;Minimize environmental distractions Postural Changes and/or Swallow Maneuvers: Seated upright 90 degrees                  Oral care BID   Frequent or constant Supervision/Assistance Dysphagia, oral phase (R13.11)     Continue with current plan of care    Angela Nevin, MA, CCC-SLP Speech Therapy

## 2023-08-31 NOTE — Progress Notes (Signed)
[facility] Progress Note [name] ZOX:[id number] DOB: 1951-01-22 DOA: 08/24/2023  DOS: the patient was seen and examined on 08/31/2023  Brief hospital course: PMH multiple cancer history (breast cancer, anal cancer, most recently lymphoma), HTN, HLD, depression/anxiety, GERD. She presented with worsening lethargy and confusion with associated weakness. 11/25 received Bendamustine. Under the care of Dr. Candise Che and Dr. Alphonsa Gin at [facility]. Doing well after her chemotherapy at the end of November, seemed to have good energy, was still driving herself and even going to work.  However 1 week prior to admission had progressive weakness, has not been eating or drinking very much, has had worsening abdominal pain, and difficulty ambulating. On workup she was found to have hypercalcemia, AKI and elevated LFTs. Medical oncology, palliative care and GI were consulted on admission.   Assessment and Plan: Acute metabolic encephalopathy Presents with progressive confusion lethargy and weakness. Hypercalcemia, AKI, lymphoma worsening as well as failure to thrive. ?  Bendamustine side effect. Hypercalcemia is now corrected, patient is conversing and answering questions appropriately but patient's mentation has not improved significantly. MRI brain without contrast negative for any acute stroke or any other acute abnormality.  Unable to perform contrast due to CKD. Daughter is hopeful that the mentation is actually improving although it appears unchanged without any progression in positive direction.  Hypercalcemia of malignancy Corrected calcium 15 on 12/9 and improved to 13.2 on 12/10  Initial symptoms included: generalized pain complaints, lethargy, confusion s/p zoledronic acid on 08/25/2023 and 4 doses of calcitonin IV fluids were held.  Will resume.   Neutropenia and thrombocytopenia from chemotherapy S/p bendamustine on 11/25; nadir not til week 3 so might fall further or other  etiology for drop currently On neutropenic precaution. Currently afebrile. Received 1 platelet transfusion on 12/13 and another 1 on 12/14. Transfuse for hemoglobin less than 7 and platelet less than 10   Elevated liver enzymes Due to overall progression of peripheral T-cell lymphoma involving the abdomen recently seen by GI. MRCP negative for signs of obstruction.  Currently receiving IV Solu-Medrol to reduce hepatic burden of the lymphoma and it appears that LFTs are improving including bilirubin. Still no significant improvement in patient's oral intake or mentation.   AKI on CKD stage IIIb. Serum creatinine 0.9, June 2024.  GFR 46 in July 2024. GFR has been in 30s in October.  Serum creatinine has trended up to 1.2-1.6 throughout October and November. Currently serum creatinine in 2s. Treated with IV fluid with some improvement.  Will resume IV fluid and monitor.  Pancreatitis?  Abdominal pain Patient reports abdominal pain with poor p.o. intake. LFTs are already been elevated as well as creatinine has been elevated. Patient has been on high-dose steroids since admission. Lipase was 640 at the time of admission. Patient had an x-ray on 12/13 which does not show any acute abnormality. CT abdomen with contrast on 12/8 showed diffuse lymphadenopathy minimally decreased in size. CT abdomen does show peripancreatic edema.  As well as constipation.  Anasarca. Appears to be developing volume overload with bilateral pleural effusion, ascites as well as anasarca seen on the CT scan. Prognosis is very poor.  Bilateral basilar infiltrate. As of right now no fever. Patient does have neutropenia. Low threshold to initiate antibiotic if there is any improvement in condition and patient's family's goal is to continue to treat what is treatable.   Counseling regarding advance care planning and goals of care Appreciate Palliative care consultation. Prognosis is poor given multiple  malignancies and  current presentation with poor p.o. intake, AKI and bicytopenia with altered mental status which has not improved despite improvement in hypercalcemia and AKI. At present daughter's goal is to treat what is treatable. Patient remains DNR/DNI. Patient appears to be failing to thrive. Ongoing abdominal pain as well as encephalopathy despite improvement in labs. On 12/13 recommended daughter to consider transition to complete comfort as well as consideration for residential hospice mother daughter not ready yet. Will continue to support and monitor.  Hypokalemia. Replaced  HTN. On losartan, amlodipine.  Currently on hold.  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 Received Bendamustine 11/25. Since then has increasing weakness and failure to thrive. Per [facility] oncology that are unlikely to be curative options for her lymphoma. No clinical trial options available. Recommendation is to consider hospice and palliative care if she experiences disease progression or side effect. Currently receiving IV Solu-Medrol.   Subjective: No nausea no vomiting no fever no chills.  Abdominal pain present.  She was able to tell me who was here yesterday from her family but unable to carry conversation further or follow any commands after that.  Physical Exam: General: in Mild distress, No Rash Cardiovascular: S1 and S2 Present, No Murmur Respiratory: Good respiratory effort, Bilateral Air entry present.  Basal crackles, No wheezes Abdomen: Bowel Sound present, abdominal tenderness Extremities: Bilateral edema Neuro: Alert and unable to answer orientation question, unable to follow commands.  No asterixis. No new focal deficit  Data Reviewed: I have Reviewed nursing notes, Vitals, and Lab results. Since last  encounter, pertinent lab results CBC and BMP   . I have ordered test including CBC and BMP  .   Disposition: Status is: Inpatient Remains inpatient appropriate because: Monitor for clarity on goals of care.  SCDs Start: 08/24/23 1205   Family Communication: No one at bedside on 12/15.  Discussed with significant other on 12/14. Level of care: Med-Surg   Vitals:   08/30/23 1923 08/31/23 0544 08/31/23 1209 08/31/23 1550  BP: (!) 151/83 (!) 155/83 (!) 172/99 (!) 156/98  Pulse: (!) 58 (!) 55 68 (!) 52  Resp: 18 19 20 18   Temp: 97.9 F (36.6 C) 98.2 F (36.8 C) 97.8 F (36.6 C) (!) 97.5 F (36.4 C)  TempSrc:   Oral Oral  SpO2: 100% 99% 100% 97%  Weight:      Height:         Author: Lynden Oxford, MD 08/31/2023 8:00 PM  Please look on [email or web address] to find out who is on call.

## 2023-08-31 NOTE — Plan of Care (Signed)
Pt continues to have pain, points to abdominal area. Dilaudid given this shift x2. Pt unable to swallow without coughing. Tested with water and with applesauce. SLP evaluation requested again. Problem: Clinical Measurements: Goal: Ability to maintain clinical measurements within normal limits will improve Outcome: Progressing Goal: Will remain free from infection Outcome: Progressing Goal: Diagnostic test results will improve Outcome: Progressing Goal: Respiratory complications will improve Outcome: Progressing Goal: Cardiovascular complication will be avoided Outcome: Progressing   Problem: Activity: Goal: Risk for activity intolerance will decrease Outcome: Progressing   Problem: Nutrition: Goal: Adequate nutrition will be maintained Outcome: Progressing   Problem: Coping: Goal: Level of anxiety will decrease Outcome: Progressing   Problem: Elimination: Goal: Will not experience complications related to urinary retention Outcome: Progressing   Problem: Pain Management: Goal: General experience of comfort will improve Outcome: Progressing   Problem: Safety: Goal: Ability to remain free from injury will improve Outcome: Progressing   Problem: Skin Integrity: Goal: Risk for impaired skin integrity will decrease Outcome: Progressing

## 2023-09-01 DIAGNOSIS — G9341 Metabolic encephalopathy: Secondary | ICD-10-CM | POA: Diagnosis not present

## 2023-09-01 LAB — GLUCOSE, CAPILLARY
Glucose-Capillary: 124 mg/dL — ABNORMAL HIGH (ref 70–99)
Glucose-Capillary: 120 mg/dL — ABNORMAL HIGH (ref 70–99)
Glucose-Capillary: 112 mg/dL — ABNORMAL HIGH (ref 70–99)
Glucose-Capillary: 127 mg/dL — ABNORMAL HIGH (ref 70–99)
Glucose-Capillary: 142 mg/dL — ABNORMAL HIGH (ref 70–99)

## 2023-09-01 LAB — CBC WITH DIFFERENTIAL/PLATELET
Abs Immature Granulocytes: 0.02 10*3/uL (ref 0.00–0.07)
Basophils Absolute: 0 10*3/uL (ref 0.0–0.1)
Basophils Relative: 3 %
Eosinophils Absolute: 0 10*3/uL (ref 0.0–0.5)
Eosinophils Relative: 0 %
HCT: 31.4 % — ABNORMAL LOW (ref 36.0–46.0)
Hemoglobin: 10.1 g/dL — ABNORMAL LOW (ref 12.0–15.0)
Immature Granulocytes: 2 %
Lymphocytes Relative: 15 %
Lymphs Abs: 0.2 10*3/uL — ABNORMAL LOW (ref 0.7–4.0)
MCH: 29.3 pg (ref 26.0–34.0)
MCHC: 32.2 g/dL (ref 30.0–36.0)
MCV: 91 fL (ref 80.0–100.0)
Monocytes Absolute: 0.1 10*3/uL (ref 0.1–1.0)
Monocytes Relative: 13 %
Neutro Abs: 0.7 10*3/uL — ABNORMAL LOW (ref 1.7–7.7)
Neutrophils Relative %: 67 %
Platelets: 10 10*3/uL — CL (ref 150–400)
RBC: 3.45 MIL/uL — ABNORMAL LOW (ref 3.87–5.11)
RDW: 27.9 % — ABNORMAL HIGH (ref 11.5–15.5)
WBC: 1 10*3/uL — CL (ref 4.0–10.5)
nRBC: 0 % (ref 0.0–0.2)

## 2023-09-01 LAB — BPAM PLATELET PHERESIS
Blood Product Expiration Date: 202412132359
Blood Product Expiration Date: 202412162359
ISSUE DATE / TIME: 202412130641
ISSUE DATE / TIME: 202412140619
Unit Type and Rh: 9500
Unit Type and Rh: 5100

## 2023-09-01 LAB — COMPREHENSIVE METABOLIC PANEL
ALT: 59 U/L — ABNORMAL HIGH (ref 0–44)
AST: 59 U/L — ABNORMAL HIGH (ref 15–41)
Albumin: 2.5 g/dL — ABNORMAL LOW (ref 3.5–5.0)
Alkaline Phosphatase: 250 U/L — ABNORMAL HIGH (ref 38–126)
Anion gap: 5 (ref 5–15)
BUN: 58 mg/dL — ABNORMAL HIGH (ref 8–23)
CO2: 20 mmol/L — ABNORMAL LOW (ref 22–32)
Calcium: 7.2 mg/dL — ABNORMAL LOW (ref 8.9–10.3)
Chloride: 124 mmol/L — ABNORMAL HIGH (ref 98–111)
Creatinine, Ser: 1.43 mg/dL — ABNORMAL HIGH (ref 0.44–1.00)
GFR, Estimated: 39 mL/min — ABNORMAL LOW (ref >60)
Glucose, Bld: 130 mg/dL — ABNORMAL HIGH (ref 70–99)
Potassium: 3.3 mmol/L — ABNORMAL LOW (ref 3.5–5.1)
Sodium: 149 mmol/L — ABNORMAL HIGH (ref 135–145)
Total Bilirubin: 3.4 mg/dL — ABNORMAL HIGH (ref <1.2)
Total Protein: 4.9 g/dL — ABNORMAL LOW (ref 6.5–8.1)

## 2023-09-01 LAB — LIPASE, BLOOD: Lipase: 33 U/L (ref 11–51)

## 2023-09-01 LAB — PREPARE PLATELET PHERESIS
Unit division: 0
Unit division: 0

## 2023-09-01 LAB — MAGNESIUM: Magnesium: 2.1 mg/dL (ref 1.7–2.4)

## 2023-09-01 MED ORDER — IPRATROPIUM-ALBUTEROL 0.5-2.5 (3) MG/3ML IN SOLN: 3.0000 mL | RESPIRATORY_TRACT | Status: DC | PRN

## 2023-09-01 MED ORDER — SENNOSIDES-DOCUSATE SODIUM 8.6-50 MG PO TABS: 1.0000 | ORAL_TABLET | Freq: Every evening | ORAL | Status: DC | PRN

## 2023-09-01 MED ORDER — DEXTROSE-SODIUM CHLORIDE 5-0.45 % IV SOLN: INTRAVENOUS | Status: DC

## 2023-09-01 MED ORDER — HYDRALAZINE HCL 20 MG/ML IJ SOLN: 10.0000 mg | INTRAMUSCULAR | Status: DC | PRN

## 2023-09-01 MED ORDER — METOPROLOL TARTRATE 5 MG/5ML IV SOLN: 5.0000 mg | INTRAVENOUS | Status: DC | PRN

## 2023-09-01 MED ORDER — PANTOPRAZOLE SODIUM 40 MG PO TBEC
40.0000 mg | DELAYED_RELEASE_TABLET | Freq: Every day | ORAL | Status: DC
Start: 2023-09-01 — End: 2023-09-05
  Administered 2023-09-02 – 2023-09-05 (×4): 40 mg via ORAL
  Filled 2023-09-01 (×5): qty 1

## 2023-09-01 NOTE — Progress Notes (Signed)
Daily Progress Note   Patient Name: Cynthia Velasquez       Date: 09/01/2023 DOB: 1951/05/19  Age: 72 y.o. MRN#: [id number] Attending Physician: Miguel Rota, MD Primary Care Physician: [name], MD Admit Date: 08/24/2023 Length of Stay: 7 days  Reason for Consultation/Follow-up: Establishing goals of care  Subjective:   CC: Patient awake alert, no distress, answers questions appropriately.      Following up regarding complex medical decision making.  Subjective:  Reviewed EMR prior to presenting to bedside.    Objective:   Vital Signs:  BP (!) 153/82 (BP Location: Right Arm)   Pulse (!) 58   Temp 97.9 F (36.6 C)   Resp 18   Ht 5\' 5"  (1.651 m)   Wt 80.8 kg   SpO2 98%   BMI 29.65 kg/m   Physical Exam: General: In bed, awake alert, appears chronically ill but not in acute distress.  Cardiovascular: RRR Neuro: awake alert.   Imaging: I personally reviewed recent imaging.   Assessment & Plan:   Assessment: Patient is a 72 year old female with a past medical history of breast cancer, anal cancer, and recent diagnosis of non-Hodgkin's lymphoma on chemotherapy, hypertension, and GERD who was admitted on 08/24/2023 for management of decreased oral intake and increased lethargy at home.  Patient had last received chemotherapy on 08/11/2023; under the care of oncologist Dr. Candise Che and coordination of care with [facility] oncology.  Patient's daughter arrived that patient had initially been doing well after chemotherapy though over the past 5 days has gotten progressively weaker, not been eating or drinking well, worsening abdominal pain, and worsening weakness.  Upon presentation to the ER, lab work showing evidence of leukopenia, thrombocytopenia, worsening liver failure, jaundice, and acute renal failure.  Imaging showing continued diffuse lymphadenopathy in setting of non-Hodgkin's lymphoma.  GI consulted for evaluation.  Oncology has been consulted for evaluation.  Palliative  medicine team consulted to assist with complex medical decision making.   Recommendations/Plan: # Complex medical decision making/goals of care:      -Patient unable to participate in complex medical decision making at that time, however with improvement in mental status.                                 -  Code Status:  established as DNR/DNI with limited medical interventions.                               - # Symptom management                -Pain, acute in setting of non-Hodgkin's lymphoma                               -Continue IV Dilaudid to 0.2-0.5 mg every 3 hours as needed for severe pain                               -Continue oral oxycodone to 2.5-5 mg every 4 hours as needed   # Psycho-social/Spiritual Support:  - Support System: daughter   # Discharge Planning:  To Be Determined: Daughter wishes to continue reasonable medical interventions for now and then the plan would be for residential hospice.     Thank you for allowing  the palliative care team to participate in the care [name].  Low MDM [name], MD  Palliative Care Provider PMT # [phone]  If patient remains symptomatic despite maximum doses, please call PMT at [phone] between 0700 and 1900. Outside of these hours, please call attending, as PMT does not have night coverage.

## 2023-09-01 NOTE — Progress Notes (Signed)
PROGRESS NOTE    Cynthia Velasquez  QMV:[id number] DOB: 1950/10/16 DOA: 08/24/2023 PCP: Cynthia Hazel, MD    Brief Narrative:  72 year old with history of multiple cancers including breast cancer, anal cancer, recent lymphoma, HTN, HLD, depression, GERD admitted to the hospital for lethargy, confusion and weakness.  Last chemotherapy was on 11/25 but over the past week prior to admission she became progressively weak.  Upon admission noted to have pancytopenia, hypercalcemia, encephalopathy, transaminitis.  Patient has been seen by both GI oncology who are both recommending that at this time patient should be transition to hospice.  Palliative care team is following.   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  Patient has been through quite a bit with several treatments for her multiple different cancers.  This is significantly taken toll on her body and at this time recommendations are to transition her to hospice and provide supportive care/comfort as much as possible.  Palliative care team is following.  Acute metabolic encephalopathy - Overall progression of her underlying malignancy.  MRI of the brain is negative for any pathology  at this time.  Hypercalcemia/hypernatremia - Related to her malignancy.  On Solu-Medrol to help with hypercalcemia -IVF - D5 1/2 NS  Transaminitis Acute Pancreatitis.  - Multiple ongoing issues.  Likely secondary to her malignancy.  MRCP negative.  No further workup per GI  AKI on CKD stage IIIb - Baseline creatinine around 1.0.  Creatinine peaked at 2.05.  Slowly has been trending up over the last several weeks  Essential hypertension -IV as needed  History of breast cancer status postmastectomy -In remission  Unfortunately given her advanced age, multiple comorbidities she has very poor prognosis.  Highly recommend comfort care/hospice at this time.  Palliative care team is following.  PT/OT  DVT prophylaxis: SCDs Start: 08/24/23 1205 Code Status: Full Code Family Communication:  Called Cynthia Velasquez Status is: Inpatient Remains inpatient appropriate because: Cont hospital stay until safe dispo    Subjective: Ptn seen at bedside. No new complaints.  Called Cynthia Velasquez, her daughter, no answer.   Patient is still reporting of abd pain and generalized weakness.    Examination:  General exam: Appears calm and comfortable . Weak and frail Respiratory system: Clear to auscultation. Respiratory effort normal. Cardiovascular system: S1 & S2 heard, RRR. No JVD, murmurs, rubs, gallops or clicks. No pedal edema. Gastrointestinal system: Abdomen is nondistended, soft and nontender. No organomegaly or masses felt. Normal bowel sounds heard. Central nervous system: Alert and oriented. No focal neurological deficits. Extremities: Symmetric 4 x 5 power. Skin: No rashes, lesions or ulcers Psychiatry: Judgement and insight appear normal. Mood & affect appropriate.                Diet Orders (From admission, onward)  Start     Ordered   08/28/23 1537  Diet full liquid Room service appropriate? Yes; Fluid consistency: Thin  Diet effective now       Question Answer Comment   Room service appropriate? Yes   Fluid consistency: Thin      08/28/23 1536            Objective: Vitals:   08/31/23 1550 08/31/23 2259 09/01/23 0529 09/01/23 1231  BP: (!) 156/98 (!) 162/92 (!) 153/93 (!) 153/82  Pulse: (!) 52 65 61 (!) 58  Resp: 18 18 15 18   Temp: (!) 97.5 F (36.4 C) 97.6 F (36.4 C) 97.7 F (36.5 C) 97.9 F (36.6 C)  TempSrc: Oral Oral Oral Oral  SpO2: 97% 100% 98% 98%  Weight:      Height:        Intake/Output Summary (Last 24 hours) at 09/01/2023 1431 Last data filed at 09/01/2023 0243 Gross per 24 hour  Intake 200 ml  Output 1300 ml  Net -1100 ml   Filed Weights   08/24/23 1331  Weight: 80.8 kg    Scheduled Meds:  sodium chloride   Intravenous Once   Chlorhexidine Gluconate Cloth  6 each Topical Daily   feeding supplement  1 Container Oral TID BM   fluconazole  100 mg Oral Daily   methylPREDNISolone (SOLU-MEDROL) injection  125 mg Intravenous BID   pantoprazole  40 mg Oral Daily   senna-docusate  2 tablet Oral BID   Continuous Infusions:  dextrose 5 % and 0.45 % NaCl 50 mL/hr at 09/01/23 1210    Nutritional status     Body mass index is 29.65 kg/m.  Data Reviewed:   CBC: Recent Labs  Lab 08/28/23 0418 08/29/23 0342 08/30/23 0230 08/30/23 1845 08/31/23 1445 09/01/23 0225  WBC 1.0* 1.0* 0.9* 1.0* 1.0* 1.0*  NEUTROABS 0.6* 0.7* 0.5*  --  0.7* 0.7*  HGB 10.1* 10.5* 10.3* 10.2* 10.4* 10.1*  HCT 30.0* 32.8* 32.4* 32.3* 32.4* 31.4*  MCV 86.7 89.6 91.3 92.0 90.0 91.0  PLT 13* 10* 10* 21* 11* 10*   Basic Metabolic Panel: Recent Labs  Lab 08/27/23 0357 08/27/23 1622 08/28/23 0418 08/28/23 1753 08/29/23 0342 08/30/23 0230 08/31/23 0248 09/01/23 0225  NA 135   < > 140 141 141 144 145 149*  K 3.4*   < > 3.2* 3.6 3.9 3.5 3.1* 3.3*  CL 105   < > 113* 117* 116* 120* 119* 124*  CO2 21*   < > 20* 19* 18* 19* 20* 20*  GLUCOSE 161*   < > 161* 182* 162* 122* 115* 130*  BUN 65*   < > 65* 62* 64* 64* 63* 58*  CREATININE  2.40*   < > 2.02* 2.05* 1.88* 1.83* 1.49* 1.43*  CALCIUM 10.4*   < > 9.1 8.3* 8.1* 7.5* 7.4* 7.2*  MG 2.1  --  1.9  --  2.0 2.0  --  2.1   < > = values in this interval not displayed.   GFR: Estimated Creatinine Clearance: 37.9 mL/min (A) (by C-G formula based on SCr of 1.43 mg/dL (H)). Liver Function Tests: Recent Labs  Lab 08/28/23 1753 08/29/23 0342 08/30/23 0230 08/31/23 0248 09/01/23 0225  AST 95* 85* 70* 64* 59*  ALT 76* 76* 66* 63* 59*  ALKPHOS 287* 290* 262* 246* 250*  BILITOT 3.9* 4.3* 3.7* 4.1* 3.4*  PROT 4.7* 4.8* 4.8* 5.0* 4.9*  ALBUMIN 2.3* 2.3* 2.4* 2.5* 2.5*   Recent Labs  Lab 08/31/23 1445  09/01/23 0225  LIPASE 34 33   No results for input(s): "AMMONIA" in the last 168 hours. Coagulation Profile: No results for input(s): "INR", "PROTIME" in the last 168 hours. Cardiac Enzymes: No results for input(s): "CKTOTAL", "CKMB", "CKMBINDEX", "TROPONINI" in the last 168 hours. BNP (last 3 results) No results for input(s): "PROBNP" in the last 8760 hours. HbA1C: No results for input(s): "HGBA1C" in the last 72 hours. CBG: Recent Labs  Lab 08/31/23 2157 08/31/23 2332 09/01/23 0441 09/01/23 0804 09/01/23 1229  GLUCAP 116* 129* 124* 120* 112*   Lipid Profile: No results for input(s): "CHOL", "HDL", "LDLCALC", "TRIG", "CHOLHDL", "LDLDIRECT" in the last 72 hours. Thyroid Function Tests: No results for input(s): "TSH", "T4TOTAL", "FREET4", "T3FREE", "THYROIDAB" in the last 72 hours. Anemia Panel: No results for input(s): "VITAMINB12", "FOLATE", "FERRITIN", "TIBC", "IRON", "RETICCTPCT" in the last 72 hours. Sepsis Labs: Recent Labs  Lab 08/30/23 0230 08/30/23 1839  LATICACIDVEN 2.3* 1.8    Recent Results (from the past 240 hours)  Resp panel by RT-PCR (RSV, Flu A&B, Covid) Anterior Nasal Swab     Status: None   Collection Time: 08/24/23  8:37 AM   Specimen: Anterior Nasal Swab  Result Value Ref Range Status   SARS Coronavirus 2 by RT PCR NEGATIVE  NEGATIVE Final    Comment: (NOTE) SARS-CoV-2 target nucleic acids are NOT DETECTED.  The SARS-CoV-2 RNA is generally detectable in upper respiratory specimens during the acute phase of infection. The lowest concentration of SARS-CoV-2 viral copies this assay can detect is 138 copies/mL. A negative result does not preclude SARS-Cov-2 infection and should not be used as the sole basis for treatment or other patient management decisions. A negative result may occur with  improper specimen collection/handling, submission of specimen other than nasopharyngeal swab, presence of viral mutation(s) within the areas targeted by this assay, and inadequate number of viral copies(<138 copies/mL). A negative result must be combined with clinical observations, patient history, and epidemiological information. The expected result is Negative.  Fact Sheet for Patients:  BloggerCourse.com  Fact Sheet for Healthcare Providers:  SeriousBroker.it  This test is no t yet approved or cleared by the [place] FDA and  has been authorized for detection and/or diagnosis of SARS-CoV-2 by FDA under an Emergency Use Authorization (EUA). This EUA will remain  in effect (meaning this test can be used) for the duration of the COVID-19 declaration under Section 564(b)(1) of the Act, 21 U.S.C.section 360bbb-3(b)(1), unless the authorization is terminated  or revoked sooner.       Influenza A by PCR NEGATIVE NEGATIVE Final   Influenza B by PCR NEGATIVE NEGATIVE Final    Comment: (NOTE) The Xpert Xpress SARS-CoV-2/FLU/RSV plus assay is intended as an aid in the diagnosis of influenza from Nasopharyngeal swab specimens and should not be used as a sole basis for treatment. Nasal washings and aspirates are unacceptable for Xpert Xpress SARS-CoV-2/FLU/RSV testing.  Fact Sheet for Patients: BloggerCourse.com  Fact Sheet for Healthcare  Providers: SeriousBroker.it  This test is not yet approved or cleared by the [place] FDA and has been authorized for detection and/or diagnosis of SARS-CoV-2 by FDA under an Emergency Use Authorization (EUA). This EUA will remain in effect (meaning this test can be used) for the duration of the COVID-19 declaration under Section 564(b)(1) of the Act, 21 U.S.C. section 360bbb-3(b)(1), unless the authorization is terminated or revoked.     Resp Syncytial Virus by PCR NEGATIVE NEGATIVE Final    Comment: (NOTE) Fact Sheet for Patients:  BloggerCourse.com  Fact Sheet for Healthcare Providers: SeriousBroker.it  This test is not yet approved or cleared by the [place] FDA and has been authorized for detection and/or diagnosis of SARS-CoV-2 by FDA under an Emergency Use Authorization (EUA). This EUA will remain in effect (meaning this test can be used) for the duration of the COVID-19 declaration under Section 564(b)(1) of the Act, 21 U.S.C. section 360bbb-3(b)(1), unless the authorization is terminated or revoked.  Performed at [facility], [address] [place], [place] [address]   MRSA Next Gen by PCR, Nasal     Status: None   Collection Time: 08/24/23  6:22 PM   Specimen: Nasal Mucosa; Nasal Swab  Result Value Ref Range Status   MRSA by PCR Next Gen NOT DETECTED NOT DETECTED Final    Comment: (NOTE) The GeneXpert MRSA Assay (FDA approved for NASAL specimens only), is one component of a comprehensive MRSA colonization surveillance program. It is not intended to diagnose MRSA infection nor to guide or monitor treatment for MRSA infections. Test performance is not FDA approved in patients less than 50 years old. Performed at [facility], [address], [place], [place] [address]          Radiology Studies: CT ABDOMEN PELVIS WO CONTRAST Result  Date: 08/30/2023 CLINICAL DATA:  Abdominal pain, acute, nonlocalized. History of T-cell lymphoma with disease progression noted on the most recent PET-CT last month. EXAM: CT ABDOMEN AND PELVIS WITHOUT CONTRAST TECHNIQUE: Multidetector CT imaging of the abdomen and pelvis was performed following the standard protocol without IV contrast. RADIATION DOSE REDUCTION: This exam was performed according to the departmental dose-optimization program which includes automated exposure control, adjustment of the mA and/or kV according to patient size and/or use of iterative reconstruction technique. COMPARISON:  MRI/MRCP 08/24/2023, CT abdomen pelvis with IV contrast 08/24/2023, PET-CT 08/07/2023 and CT abdomen and pelvis with IV contrast 07/31/2023. FINDINGS: Lower chest: Again noted minimal right and trace left layering pleural effusions, stable. There is patchy ground-glass disease with underlying interstitial thickening in the anterior base of the right upper lobe and in the medial lingula, new since December 8, most likely due to bilateral pneumonia. There is subsegmental atelectasis above a mildly elevated left posterior hemidiaphragm. Remaining visible lung bases are clear. There is mild cardiomegaly. Port catheter terminates about the superior cavoatrial junction. No pericardial effusion. The cardiac blood pool is less dense than the myocardium consistent with anemia. Hepatobiliary: No focal liver abnormality is seen. Status post cholecystectomy. No biliary dilatation. Pancreas: Faint increased peripancreatic edema. Correlate with serum lipase for potential acute interstitial pancreatitis. No peripancreatic fluid collection, mass or ductal dilatation. Spleen: Unremarkable without contrast. No splenomegaly. Mild increased perisplenic ascites. Adrenals/Urinary Tract: No adrenal mass. Small Bosniak 1 cyst superior pole right kidney. Hounsfield density of -3. The unenhanced kidneys are otherwise unremarkable. There is no  urinary stone or obstruction. The ureters are normal caliber. Both ureters extend over a conglomerate retroperitoneal multilevel lymph node mantle. The bladder is posteriorly obscured by spray artifact from bilateral hip replacements. The anterior bladder is unremarkable. Stomach/Bowel: No dilatation or overt wall thickening. An appendix is not seen in this patient. There is moderate retained stool in the ascending and transverse colon. Uncomplicated sigmoid diverticulosis. Vascular/Lymphatic: Aortic atherosclerosis. No AAA. Branch vessel atherosclerosis also. No appreciable interval change without contrast in previously described multifocal adenopathy, short axis measurements of index nodes as follows: Right retrocrural again measuring 1.2 cm; Porta-hepatis lymph node 1.1 cm; Large retroperitoneal lymph  node conglomerate mass with aortocaval and proximal iliac vascular encasement, slightly above the aortic bifurcation is 7.5 x 3.3 cm; Left mid abdominal mesenteric lymph node 1 cm (2:46); right inguinal chain 1.1 cm on 2:86. There is extrinsic compression of the infrarenal IVC and common iliac veins, better demonstrated with contrast. Reproductive: Status post hysterectomy. No adnexal masses. Other: Mild amount of abdominal and pelvic ascites, increased. Small supraumbilical anterior wall fat hernias are unchanged. There is no incarcerated hernia. There is no free hemorrhage, free air, or localizing collection. Musculoskeletal: Reverse S shaped thoracolumbar scoliosis with osteopenia and advanced degenerative changes of the lumbar spine, prior bilateral hip replacements. Osteopenia. Multilevel acquired lumbar spinal stenosis greatest at L3-4. No destructive bone lesion is seen. There is increased body wall anasarca compared to the last CT, particularly in the flanks and outer reaches of the abdominal wall. External uterine collection device in place. IMPRESSION: 1. New patchy ground-glass disease with underlying  interstitial thickening in the anterior base of the right upper lobe and in the medial lingula, most likely due to bilateral pneumonia. 2. Stable minimal right and trace left pleural effusions. 3. Cardiomegaly with anemia. 4. Faint increased peripancreatic edema. Correlate with serum lipase for potential acute interstitial pancreatitis. 5. Increased mild abdominal and pelvic ascites. 6. Increased body wall anasarca. 7. Constipation and diverticulosis. 8. Aortic and branch vessel atherosclerosis. 9. Multifocal adenopathy consistent with history of T-cell lymphoma, not significantly changed without contrast since 1 week ago. 10. Osteopenia, scoliosis and degenerative change. Multilevel acquired lumbar spinal stenosis greatest at L3-4. Aortic Atherosclerosis (ICD10-I70.0). Electronically Signed   By: Almira Bar M.D.   On: 08/30/2023 20:51           LOS: 7 days   Time spent= 35 mins    Miguel Rota, MD [facility]  If 7PM-7AM, please contact night-coverage  09/01/2023, 2:31 PM

## 2023-09-01 NOTE — Progress Notes (Signed)
OT Cancellation Note  Patient Details Name: Cynthia Velasquez MRN: [id number] DOB: [birth year]   Cancelled Treatment:    Reason Eval/Treat Not Completed: Medical issues which prohibited therapy Patient noted to have low platelet count (10) with palliative note indicating plan was to transition to residential hospice at time of d/c. OT to continue to follow and check back as schedule will allow on 12/17 for appropriateness for OT session.  Rosalio Loud, MS Acute Rehabilitation Department Office# [phone]  09/01/2023, 4:12 PM

## 2023-09-01 NOTE — Progress Notes (Signed)
Critical labs WBC 1.0 and Platelets 10.. Informed Chinita [place]

## 2023-09-02 DIAGNOSIS — N179 Acute kidney failure, unspecified: Secondary | ICD-10-CM | POA: Diagnosis not present

## 2023-09-02 DIAGNOSIS — Z515 Encounter for palliative care: Secondary | ICD-10-CM | POA: Diagnosis not present

## 2023-09-02 DIAGNOSIS — Z7189 Other specified counseling: Secondary | ICD-10-CM

## 2023-09-02 DIAGNOSIS — G9341 Metabolic encephalopathy: Secondary | ICD-10-CM | POA: Diagnosis not present

## 2023-09-02 DIAGNOSIS — D696 Thrombocytopenia, unspecified: Secondary | ICD-10-CM

## 2023-09-02 DIAGNOSIS — R531 Weakness: Secondary | ICD-10-CM | POA: Diagnosis not present

## 2023-09-02 LAB — COMPREHENSIVE METABOLIC PANEL
ALT: 52 U/L — ABNORMAL HIGH (ref 0–44)
AST: 54 U/L — ABNORMAL HIGH (ref 15–41)
Albumin: 2.4 g/dL — ABNORMAL LOW (ref 3.5–5.0)
Alkaline Phosphatase: 224 U/L — ABNORMAL HIGH (ref 38–126)
Anion gap: 7 (ref 5–15)
BUN: 52 mg/dL — ABNORMAL HIGH (ref 8–23)
CO2: 21 mmol/L — ABNORMAL LOW (ref 22–32)
Calcium: 6.9 mg/dL — ABNORMAL LOW (ref 8.9–10.3)
Chloride: 125 mmol/L — ABNORMAL HIGH (ref 98–111)
Creatinine, Ser: 1.48 mg/dL — ABNORMAL HIGH (ref 0.44–1.00)
GFR, Estimated: 37 mL/min — ABNORMAL LOW (ref >60)
Glucose, Bld: 151 mg/dL — ABNORMAL HIGH (ref 70–99)
Potassium: 3.2 mmol/L — ABNORMAL LOW (ref 3.5–5.1)
Sodium: 153 mmol/L — ABNORMAL HIGH (ref 135–145)
Total Bilirubin: 3 mg/dL — ABNORMAL HIGH (ref <1.2)
Total Protein: 4.7 g/dL — ABNORMAL LOW (ref 6.5–8.1)

## 2023-09-02 LAB — CBC WITH DIFFERENTIAL/PLATELET
Abs Immature Granulocytes: 0.02 10*3/uL (ref 0.00–0.07)
Basophils Absolute: 0 10*3/uL (ref 0.0–0.1)
Basophils Relative: 2 %
Eosinophils Absolute: 0 10*3/uL (ref 0.0–0.5)
Eosinophils Relative: 0 %
HCT: 30.8 % — ABNORMAL LOW (ref 36.0–46.0)
Hemoglobin: 10 g/dL — ABNORMAL LOW (ref 12.0–15.0)
Immature Granulocytes: 2 %
Lymphocytes Relative: 22 %
Lymphs Abs: 0.3 10*3/uL — ABNORMAL LOW (ref 0.7–4.0)
MCH: 29.6 pg (ref 26.0–34.0)
MCHC: 32.5 g/dL (ref 30.0–36.0)
MCV: 91.1 fL (ref 80.0–100.0)
Monocytes Absolute: 0.1 10*3/uL (ref 0.1–1.0)
Monocytes Relative: 10 %
Neutro Abs: 0.7 10*3/uL — ABNORMAL LOW (ref 1.7–7.7)
Neutrophils Relative %: 64 %
Platelets: 7 10*3/uL — CL (ref 150–400)
RBC: 3.38 MIL/uL — ABNORMAL LOW (ref 3.87–5.11)
RDW: 27.8 % — ABNORMAL HIGH (ref 11.5–15.5)
WBC: 1.1 10*3/uL — CL (ref 4.0–10.5)
nRBC: 0 % (ref 0.0–0.2)

## 2023-09-02 LAB — MAGNESIUM: Magnesium: 1.8 mg/dL (ref 1.7–2.4)

## 2023-09-02 LAB — GLUCOSE, CAPILLARY
Glucose-Capillary: 128 mg/dL — ABNORMAL HIGH (ref 70–99)
Glucose-Capillary: 132 mg/dL — ABNORMAL HIGH (ref 70–99)
Glucose-Capillary: 139 mg/dL — ABNORMAL HIGH (ref 70–99)
Glucose-Capillary: 145 mg/dL — ABNORMAL HIGH (ref 70–99)
Glucose-Capillary: 152 mg/dL — ABNORMAL HIGH (ref 70–99)
Glucose-Capillary: 163 mg/dL — ABNORMAL HIGH (ref 70–99)
Glucose-Capillary: 165 mg/dL — ABNORMAL HIGH (ref 70–99)

## 2023-09-02 LAB — TYPE AND SCREEN
ABO/RH(D): O POS
Antibody Screen: NEGATIVE

## 2023-09-02 LAB — PHOSPHORUS: Phosphorus: 2.1 mg/dL — ABNORMAL LOW (ref 2.5–4.6)

## 2023-09-02 MED ORDER — POTASSIUM PHOSPHATES 15 MMOLE/5ML IV SOLN
30.0000 mmol | Freq: Once | INTRAVENOUS | Status: AC
  Administered 2023-09-02: 30 mmol via INTRAVENOUS
  Filled 2023-09-02: qty 10

## 2023-09-02 MED ORDER — DEXTROSE 5 % IV SOLN: INTRAVENOUS | Status: DC

## 2023-09-02 MED ORDER — POTASSIUM CHLORIDE 20 MEQ PO PACK
40.0000 meq | PACK | Freq: Once | ORAL | Status: AC
  Administered 2023-09-02: 40 meq via ORAL
  Filled 2023-09-02: qty 2

## 2023-09-02 MED ORDER — SODIUM CHLORIDE 0.9% IV SOLUTION: Freq: Once | INTRAVENOUS | Status: AC

## 2023-09-02 NOTE — Progress Notes (Signed)
OT Cancellation Note  Patient Details Name: Kinsee Klinkner MRN: [id number] DOB: [birth year]   Cancelled Treatment:    Reason Eval/Treat Not Completed: OT screened, no needs identified, will sign off Patient with worsening labs this AM. Patients plan noted to be residential hospice per palliative note. OT to sign off at this time Rosalio Loud, [place] Acute Rehabilitation Department Office# [phone]  09/02/2023, 8:01 AM

## 2023-09-02 NOTE — Evaluation (Signed)
Physical Therapy Evaluation Patient Details Name: Cynthia Velasquez MRN: [id number] DOB: 04/27/1951 Today's Date: 09/02/2023  History of Present Illness  Patient is a 72 year old female with a past medical history of breast cancer, anal cancer, and recent diagnosis of non-Hodgkin's lymphoma, TKA and THA, R shoulder sope,  on chemotherapy, hypertension, and GERD who was admitted on 08/24/2023 for management of decreased oral intake and increased lethargy at home.  Clinical Impression  PT Eval limited as patient  reports that she does not feel up to mobility and sitting onto bed edge. Assisted with AAROM Both LE's repositioning in bed. Patient did  perk up with the offer of an [place].  Family not present Patient  with noted very low Plts and WBC values. Disposition uncertain, depending on patient tolerance to mobility and  ability to participate  and GOC. Patient asking to attempt mobility tomorrow.        If plan is discharge home, recommend the following: Two people to help with walking and/or transfers;A lot of help with bathing/dressing/bathroom;Assist for transportation;Help with stairs or ramp for entrance   Can travel by private vehicle    no    Equipment Recommendations Hospital bed;Wheelchair (measurements PT);Wheelchair cushion (measurements PT) (to c home)  Recommendations for Other Services       Functional Status Assessment Patient has had a recent decline in their functional status and demonstrates the ability to make significant improvements in function in a reasonable and predictable amount of time.     Precautions / Restrictions Precautions Precautions: Fall Precaution Comments: critial Platelet and WBC levels      Mobility  Bed Mobility               General bed mobility comments: pt. declined    Transfers                        Ambulation/Gait                  Stairs            Wheelchair Mobility     Tilt Bed     Modified Rankin (Stroke Patients Only)       Balance                                             Pertinent Vitals/Pain Pain Assessment Faces Pain Scale: No hurt    Home Living Family/patient expects to be discharged to:: Private residence Living Arrangements: Children Available Help at Discharge: Family Type of Home: Apartment Home Access: Stairs to enter   Secretary/administrator of Steps: 1   Home Layout: One level Home Equipment: BSC/3in1      Prior Function Prior Level of Function : Independent/Modified Independent             Mobility Comments: patient limited in providig  recent PLOF ADLs Comments: unsure     Extremity/Trunk Assessment   Upper Extremity Assessment Upper Extremity Assessment: Right hand dominant;Generalized weakness    Lower Extremity Assessment Lower Extremity Assessment: Generalized weakness (patient assist with  AAROM both legs but  did not activiely move)    Cervical / Trunk Assessment Cervical / Trunk Assessment: Other exceptions Cervical / Trunk Exceptions: not stested in sitting upright as patient declined  Communication   Communication Communication: No apparent difficulties  Cognition Arousal: Alert Behavior During  Therapy: Flat affect, WFL for tasks assessed/performed Overall Cognitive Status: Difficult to assess                                 General Comments: patient limited in  providing information,  reports" I don't really feel like it", did perk up when offered an [place] ice        General Comments General comments (skin integrity, edema, etc.): unable to assess    Exercises General Exercises - Upper Extremity Shoulder Flexion: AAROM, Both, 10 reps Shoulder ADduction: AAROM, Both, 10 reps Shoulder Horizontal ABduction: AAROM, Both, 10 reps Elbow Flexion: AAROM, Both, 10 reps Elbow Extension: AAROM, Both, 10 reps General Exercises - Lower Extremity Ankle Circles/Pumps:  AAROM, Both, 10 reps Heel Slides: AAROM, Both, 10 reps Hip ABduction/ADduction: AAROM, 10 reps, Both   Assessment/Plan    PT Assessment Patient needs continued PT services  PT Problem List Decreased strength;Decreased mobility;Decreased activity tolerance       PT Treatment Interventions Therapeutic activities;Therapeutic exercise;Patient/family education;Functional mobility training    PT Goals (Current goals can be found in the Care Plan section)  Acute Rehab PT Goals Patient Stated Goal: to get up PT Goal Formulation: With patient Time For Goal Achievement: 09/16/23 Potential to Achieve Goals: Poor    Frequency Min 1X/week     Co-evaluation               AM-PAC PT "6 Clicks" Mobility  Outcome Measure Help needed turning from your back to your side while in a flat bed without using bedrails?: Total Help needed moving from lying on your back to sitting on the side of a flat bed without using bedrails?: Total Help needed moving to and from a bed to a chair (including a wheelchair)?: Total Help needed standing up from a chair using your arms (e.g., wheelchair or bedside chair)?: Total Help needed to walk in hospital room?: Total Help needed climbing 3-5 steps with a railing? : Total 6 Click Score: 6    End of Session   Activity Tolerance: Patient limited by fatigue Patient left: in bed;with bed alarm set Nurse Communication: Mobility status PT Visit Diagnosis: Muscle weakness (generalized) (M62.81)    Time: 3244-0102 PT Time Calculation (min) (ACUTE ONLY): 19 min   Charges:   PT Evaluation $PT Eval Low Complexity: 1 Low   PT General Charges $$ ACUTE PT VISIT: 1 Visit         Blanchard Kelch PT Acute Rehabilitation Services Office [phone] Weekend [phone]   Rada Hay 09/02/2023, 3:07 PM

## 2023-09-02 NOTE — Progress Notes (Signed)
PROGRESS NOTE    Cynthia Velasquez  BJY:[id number] DOB: 1950-12-22 DOA: 08/24/2023 PCP: [name], MD    Brief Narrative:  72 year old with history of multiple cancers including breast cancer, anal cancer, recent lymphoma, HTN, HLD, depression, GERD admitted to the hospital for lethargy, confusion and weakness.  Last chemotherapy was on 11/25 but over the past week prior to admission she became progressively weak.  Upon admission noted to have pancytopenia, hypercalcemia, encephalopathy, transaminitis.  Patient has been seen by both GI oncology who are both recommending that at this time patient should be transition to hospice.  Palliative care team is following.   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  Patient has been through quite a bit with several treatments for her multiple different cancers.  This is significantly taken toll on her body and at this time recommendations are to transition her to hospice and provide supportive care/comfort as much as possible.  Palliative care team is following. -1 unit platelet transfusion  Acute metabolic encephalopathy - Overall progression of her underlying malignancy.  MRI of the brain  is negative for any pathology at this time.  Hypercalcemia/hypernatremia, slightly worsening - Related to her malignancy.  On Solu-Medrol to help with hypercalcemia -Change fluids to D5 water  Transaminitis Acute Pancreatitis.  - Multiple ongoing issues.  Likely secondary to her malignancy.  MRCP negative.  No further workup per GI  AKI on CKD stage IIIb - Baseline creatinine around 1.0.  Creatinine peaked at 2.05.  Slowly has been trending up over the last several weeks  Essential hypertension -IV as needed  History of breast cancer status postmastectomy -In remission  Unfortunately given her advanced age, multiple comorbidities she has very poor prognosis.  Highly recommend comfort care/hospice at this time.  Palliative care team is following.  PT/OT  DVT prophylaxis: SCDs Start: 08/24/23 1205 Code Status: Full Code Family Communication:   Status is: Inpatient Remains inpatient appropriate because: Cont hospital stay until safe dispo    Subjective: Seen at bedside, complaining of abdominal pain Poor oral intake  Examination:  General exam: Appears calm and comfortable . Weak and frail Respiratory system: Clear to auscultation. Respiratory effort normal. Cardiovascular system: S1 & S2 heard, RRR. No JVD, murmurs, rubs, gallops or clicks. No pedal edema. Gastrointestinal system: Abdomen is nondistended, soft and nontender. No organomegaly or masses felt. Normal bowel sounds heard. Central nervous system: Alert and oriented. No focal neurological deficits. Extremities: Symmetric 4 x 5 power. Skin: No rashes, lesions or ulcers Psychiatry: Judgement and insight appear normal. Mood & affect appropriate.                Diet Orders (From admission, onward)     Start     Ordered   08/28/23 1537  Diet full  liquid Room service appropriate? Yes; Fluid consistency: Thin  Diet effective now       Question Answer Comment  Room service appropriate? Yes   Fluid  consistency: Thin      08/28/23 1536            Objective: Vitals:   09/01/23 0529 09/01/23 1231 09/01/23 2159 09/02/23 0614  BP: (!) 153/93 (!) 153/82 (!) 157/84 (!) 153/83  Pulse: 61 (!) 58 (!) 58 62  Resp: 15 18 18 18   Temp: 97.7 F (36.5 C) 97.9 F (36.6 C) 97.9 F (36.6 C) 98.6 F (37 C)  TempSrc: Oral Oral Oral Oral  SpO2: 98% 98% 97% 98%  Weight:      Height:        Intake/Output Summary (Last 24 hours) at 09/02/2023 1215 Last data filed at 09/02/2023 0650 Gross per 24 hour  Intake 273.9 ml  Output 1350 ml  Net -1076.1 ml   Filed Weights   08/24/23 1331  Weight: 80.8 kg    Scheduled Meds:  sodium chloride   Intravenous Once   sodium chloride   Intravenous Once   Chlorhexidine Gluconate Cloth  6 each Topical Daily   feeding supplement  1 Container Oral TID BM   fluconazole  100 mg Oral Daily   methylPREDNISolone (SOLU-MEDROL) injection  125 mg Intravenous BID   pantoprazole  40 mg Oral Daily   senna-docusate  2 tablet Oral BID   Continuous Infusions:  dextrose 75 mL/hr at 09/02/23 0933   potassium PHOSPHATE IVPB (in mmol) 30 mmol (09/02/23 1013)    Nutritional status     Body mass index is 29.65 kg/m.  Data Reviewed:   CBC: Recent Labs  Lab 08/29/23 0342 08/30/23 0230 08/30/23 1845 08/31/23 1445 09/01/23 0225 09/02/23 0219  WBC 1.0* 0.9* 1.0* 1.0* 1.0* 1.1*  NEUTROABS 0.7* 0.5*  --  0.7* 0.7* 0.7*  HGB 10.5* 10.3* 10.2* 10.4* 10.1* 10.0*  HCT 32.8* 32.4* 32.3* 32.4* 31.4* 30.8*  MCV 89.6 91.3 92.0 90.0 91.0 91.1  PLT 10* 10* 21* 11* 10* 7*   Basic Metabolic Panel: Recent Labs  Lab 08/28/23 0418 08/28/23 1753 08/29/23 0342 08/30/23 0230 08/31/23 0248 09/01/23 0225 09/02/23 0219  NA 140   < > 141 144 145 149* 153*  K 3.2*   < > 3.9 3.5 3.1* 3.3* 3.2*  CL 113*   < > 116* 120* 119* 124* 125*  CO2 20*   < > 18* 19* 20* 20* 21*  GLUCOSE 161*   < > 162* 122* 115* 130* 151*  BUN 65*   < > 64* 64* 63* 58* 52*  CREATININE 2.02*    < > 1.88* 1.83* 1.49* 1.43* 1.48*  CALCIUM 9.1   < > 8.1* 7.5* 7.4* 7.2* 6.9*  MG 1.9  --  2.0 2.0  --  2.1 1.8  PHOS  --   --   --   --   --   --  2.1*   < > = values in this interval not displayed.   GFR: Estimated Creatinine Clearance: 36.1 mL/min (A) (by C-G formula based on SCr of 1.48 mg/dL (H)). Liver Function Tests: Recent Labs  Lab 08/29/23 0342 08/30/23 0230 08/31/23 0248 09/01/23 0225 09/02/23 0219  AST 85* 70* 64* 59* 54*  ALT 76* 66* 63* 59* 52*  ALKPHOS 290* 262* 246* 250* 224*  BILITOT 4.3* 3.7* 4.1* 3.4* 3.0*  PROT 4.8* 4.8* 5.0* 4.9* 4.7*  ALBUMIN 2.3* 2.4* 2.5* 2.5* 2.4*  Recent Labs  Lab 08/31/23 1445 09/01/23 0225  LIPASE 34 33   No results for input(s): "AMMONIA" in the last 168 hours. Coagulation Profile: No results for input(s): "INR", "PROTIME" in the last 168 hours. Cardiac Enzymes: No results for input(s): "CKTOTAL", "CKMB", "CKMBINDEX", "TROPONINI" in the last 168 hours. BNP (last 3 results) No results for input(s): "PROBNP" in the last 8760 hours. HbA1C: No results for input(s): "HGBA1C" in the last 72 hours. CBG: Recent Labs  Lab 09/01/23 1736 09/01/23 2202 09/02/23 0020 09/02/23 0615 09/02/23 0754  GLUCAP 127* 142* 139* 152* 132*   Lipid Profile: No results for input(s): "CHOL", "HDL", "LDLCALC", "TRIG", "CHOLHDL", "LDLDIRECT" in the last 72 hours. Thyroid Function Tests: No results for input(s): "TSH", "T4TOTAL", "FREET4", "T3FREE", "THYROIDAB" in the last 72 hours. Anemia Panel: No results for input(s): "VITAMINB12", "FOLATE", "FERRITIN", "TIBC", "IRON", "RETICCTPCT" in the last 72 hours. Sepsis Labs: Recent Labs  Lab 08/30/23 0230 08/30/23 1839  LATICACIDVEN 2.3* 1.8    Recent Results (from the past 240 hours)  Resp panel by RT-PCR (RSV, Flu A&B, Covid) Anterior Nasal Swab     Status: None   Collection Time: 08/24/23  8:37 AM   Specimen: Anterior Nasal Swab  Result Value Ref Range Status   SARS Coronavirus 2 by RT  PCR NEGATIVE NEGATIVE Final    Comment: (NOTE) SARS-CoV-2 target nucleic acids are NOT DETECTED.  The SARS-CoV-2 RNA is generally detectable in upper respiratory specimens during the acute phase of infection. The lowest concentration of SARS-CoV-2 viral copies this assay can detect is 138 copies/mL. A negative result does not preclude SARS-Cov-2 infection and should not be used as the sole basis for treatment or other patient management decisions. A negative result may occur with  improper specimen collection/handling, submission of specimen other than nasopharyngeal swab, presence of viral mutation(s) within the areas targeted by this assay, and inadequate number of viral copies(<138 copies/mL). A negative result must be combined with clinical observations, patient history, and epidemiological information. The expected result is Negative.  Fact Sheet for Patients:  BloggerCourse.com  Fact Sheet for Healthcare Providers:  SeriousBroker.it  This test is no t yet approved or cleared by the [place] FDA and  has been authorized for detection and/or diagnosis of SARS-CoV-2 by FDA under an Emergency Use Authorization (EUA). This EUA will remain  in effect (meaning this test can be used) for the duration of the COVID-19 declaration under Section 564(b)(1) of the Act, 21 U.S.C.section 360bbb-3(b)(1), unless the authorization is terminated  or revoked sooner.       Influenza A by PCR NEGATIVE NEGATIVE Final   Influenza B by PCR NEGATIVE NEGATIVE Final    Comment: (NOTE) The Xpert Xpress SARS-CoV-2/FLU/RSV plus assay is intended as an aid in the diagnosis of influenza from Nasopharyngeal swab specimens and should not be used as a sole basis for treatment. Nasal washings and aspirates are unacceptable for Xpert Xpress SARS-CoV-2/FLU/RSV testing.  Fact Sheet for Patients: BloggerCourse.com  Fact Sheet for  Healthcare Providers: SeriousBroker.it  This test is not yet approved or cleared by the [place] FDA and has been authorized for detection and/or diagnosis of SARS-CoV-2 by FDA under an Emergency Use Authorization (EUA). This EUA will remain in effect (meaning this test can be used) for the duration of the COVID-19 declaration under Section 564(b)(1) of the Act, 21 U.S.C. section 360bbb-3(b)(1), unless the authorization is terminated or revoked.     Resp Syncytial Virus by PCR NEGATIVE NEGATIVE Final  Comment: (NOTE) Fact Sheet for Patients: BloggerCourse.com  Fact Sheet for Healthcare Providers: SeriousBroker.it  This test is not yet approved or cleared by the [place] FDA and has been authorized for detection and/or diagnosis of SARS-CoV-2 by FDA under an Emergency Use Authorization (EUA). This EUA will remain in effect (meaning this test can be used) for the duration of the COVID-19 declaration under Section 564(b)(1) of the Act, 21 U.S.C. section 360bbb-3(b)(1), unless the authorization is terminated or revoked.  Performed at [facility], [address] [place], [place] [address]   MRSA Next Gen by PCR, Nasal     Status: None   Collection Time: 08/24/23  6:22 PM   Specimen: Nasal Mucosa; Nasal Swab  Result Value Ref Range Status   MRSA by PCR Next Gen NOT DETECTED NOT DETECTED Final    Comment: (NOTE) The GeneXpert MRSA Assay (FDA approved for NASAL specimens only), is one component of a comprehensive MRSA colonization surveillance program. It is not intended to diagnose MRSA infection nor to guide or monitor treatment for MRSA infections. Test performance is not FDA approved in patients less than 76 years old. Performed at [facility], [address], [place], [place] [address]          Radiology Studies: No results  found.         LOS: 8 days   Time spent= 35 mins    Miguel Rota, MD [facility]  If 7PM-7AM, please contact night-coverage  09/02/2023, 12:15 PM

## 2023-09-02 NOTE — Progress Notes (Signed)
Daily Progress Note   Patient Name: Cynthia Velasquez       Date: 09/02/2023 DOB: 02-04-1951  Age: 72 y.o. MRN#: [id number] Attending Physician: Cynthia Velasquez Primary Care Physician: Cynthia Velasquez Admit Date: 08/24/2023 Length of Stay: 8 days  Reason for Consultation/Follow-up: Establishing goals of care  Subjective:   CC: Patient interactive today noting she wants to work with physical therapy to regain her strength.  Following up regarding complex medical decision making.  Subjective:  Extensive review of EMR prior to presenting to bedside.  Patient noted to have multiple electrolyte abnormalities and thrombocytopenia.  Presented to bedside to meet with patient.  No family present at bedside.  Patient lying comfortably in the bed.  Patient easily engaging in conversation.  Wished patient a happy birthday.  Spent time discussing medical care with patient.  Discussed with patient's underlying medical conditions including your to thrive and pancytopenia in setting of recent chemotherapy, inquired what patient was hoping for moving forward.  Patient notes that though she is ill, she wants to try to regain her strength as much as possible.  Patient was working prior to worsening of medical status and hospital admission.  Patient admits she is weak though wants to try to see if she can work with physical therapy and Occupational Therapy.  Patient even acknowledged she would be willing to go to rehab to regain her strength as a step before going home.  Discussed could have PT/OT evaluate to then determine if she would even be a candidate for rehab.  If patient is not a candidate, then need to be further discussions about going home with hospice.  Spent time providing emotional support reactive listening.  All questions answered at that time.  Noted palliative medicine team will continue to follow along with patient's medical journey.  Discussed care with IDT.  PT/OT going to evaluate patient.   Patient herself stated wish to at least try physical therapy to know if she can participate or not.  Should patient not be able to appropriately participate with PT/OT, then appropriate to work getting patient home with hospice.  Would need to make sure daughter can support patient's care at home with hospice.  Objective:   Vital Signs:  BP (!) 153/83 (BP Location: Right Arm)   Pulse 62   Temp 98.6 F (37 C) (Oral)   Resp 18   Ht 5\' 5"  (1.651 m)   Wt 80.8 kg   SpO2 98%   BMI 29.65 kg/m   Physical Exam: General: NAD, alert, pleasant, chronically ill appearing  Cardiovascular: Bradycardia noted Respiratory: no increased work of breathing noted, not in respiratory distress Skin: no rashes or lesions on visible skin Neuro: Alert, interactive, following commands easily Psych: appropriately answers all questions  Imaging: I personally reviewed recent imaging.   Assessment & Plan:   Assessment: Patient is a 72 year old female with a past medical history of breast cancer, anal cancer, and recent diagnosis of non-Hodgkin's lymphoma on chemotherapy, hypertension, and GERD who was admitted on 08/24/2023 for management of decreased oral intake and increased lethargy at home.  Patient had last received chemotherapy on 08/11/2023; under the care of oncologist Dr. Candise Velasquez and coordination of care with [facility] oncology.  Patient's daughter arrived that patient had initially been doing well after chemotherapy though over the past 5 days has gotten progressively weaker, not been eating or drinking well, worsening abdominal pain, and worsening weakness.  Upon presentation to the ER, lab work showing evidence of  leukopenia, thrombocytopenia, worsening liver failure, jaundice, and acute renal failure.  Imaging showing continued diffuse lymphadenopathy in setting of non-Hodgkin's lymphoma.  GI consulted for evaluation; MRCP recommended and has been ordered to obtain.  Oncology has been consulted for  evaluation.  Palliative medicine team consulted to assist with complex medical decision making.   Recommendations/Plan: # Complex medical decision making/goals of care:    -Discussed care with patient as detailed above in HPI.  No family at bedside during visit.  Patient can states she knows she is medically ill.  Patient was functioning and working prior to this deterioration.  Patient wants to try to regain her strength to be as functional as she can as that is important to her quality of life.  Patient stated at that visit she would be willing to participate in rehab if needed before going home.  Patient willing to work with PT, OT at this time for evaluation.  If patient is not appropriate for rehab or cannot maintain participation oral intake, then would recommend home with hospice.                  Code Status: Limited: Do not attempt resuscitation (DNR) -DNR-LIMITED -Do Not Intubate/DNI    # Symptom management                -Pain, acute in setting of non-Hodgkin's lymphoma                               -Continue IV Dilaudid to 0.2-0.5 mg every 3 hours as needed for severe pain                               -Continue oral oxycodone to 2.5-5 mg every 4 hours as needed   # Psycho-social/Spiritual Support:  - Support System: daughter  # Discharge Planning: To Be Determined  Discussed with: patient, TOC, PT/OT, hospitalist   Thank you for allowing the palliative care team to participate in the care Cynthia Velasquez.  Cynthia Morin, DO Palliative Care Provider PMT # [phone]  If patient remains symptomatic despite maximum doses, please call PMT at [phone] between 0700 and 1900. Outside of these hours, please call attending, as PMT does not have night coverage.  Personally spent 37 minutes in patient care including extensive chart review (labs, imaging, progress/consult notes, vital signs), medically appropraite exam, discussed with treatment team, education to patient, family, and  staff, documenting clinical information, medication review and management, coordination of care, and available advanced directive documents.   *Please note that this is a verbal dictation therefore any spelling or grammatical errors are due to the "Dragon [employer] One" system interpretation.

## 2023-09-02 NOTE — Progress Notes (Signed)
PT Cancellation Note  Patient Details Name: Cynthia Velasquez MRN: [id number] DOB: [birth year]   Cancelled Treatment:    Reason Eval/Treat Not Completed: PT screened, no needs identified, will sign off Patient with worsening labs, noted recommendation for Hospice.  Blanchard Kelch PT Acute Rehabilitation Services Office [phone] Weekend [phone]   Rada Hay 09/02/2023, 7:46 AM

## 2023-09-03 ENCOUNTER — Telehealth: Payer: Self-pay | Admitting: *Deleted

## 2023-09-03 DIAGNOSIS — G9341 Metabolic encephalopathy: Secondary | ICD-10-CM | POA: Diagnosis not present

## 2023-09-03 DIAGNOSIS — Z515 Encounter for palliative care: Secondary | ICD-10-CM | POA: Diagnosis not present

## 2023-09-03 DIAGNOSIS — R531 Weakness: Secondary | ICD-10-CM | POA: Diagnosis not present

## 2023-09-03 DIAGNOSIS — N179 Acute kidney failure, unspecified: Secondary | ICD-10-CM | POA: Diagnosis not present

## 2023-09-03 LAB — CBC WITH DIFFERENTIAL/PLATELET
Abs Immature Granulocytes: 0.01 10*3/uL (ref 0.00–0.07)
Basophils Absolute: 0 10*3/uL (ref 0.0–0.1)
Basophils Relative: 2 %
Eosinophils Absolute: 0 10*3/uL (ref 0.0–0.5)
Eosinophils Relative: 0 %
HCT: 30 % — ABNORMAL LOW (ref 36.0–46.0)
Hemoglobin: 9.9 g/dL — ABNORMAL LOW (ref 12.0–15.0)
Immature Granulocytes: 1 %
Lymphocytes Relative: 24 %
Lymphs Abs: 0.3 10*3/uL — ABNORMAL LOW (ref 0.7–4.0)
MCH: 30.1 pg (ref 26.0–34.0)
MCHC: 33 g/dL (ref 30.0–36.0)
MCV: 91.2 fL (ref 80.0–100.0)
Monocytes Absolute: 0.1 10*3/uL (ref 0.1–1.0)
Monocytes Relative: 8 %
Neutro Abs: 0.8 10*3/uL — ABNORMAL LOW (ref 1.7–7.7)
Neutrophils Relative %: 65 %
Platelets: 20 10*3/uL — CL (ref 150–400)
RBC: 3.29 MIL/uL — ABNORMAL LOW (ref 3.87–5.11)
RDW: 27.4 % — ABNORMAL HIGH (ref 11.5–15.5)
WBC: 1.1 10*3/uL — CL (ref 4.0–10.5)
nRBC: 0 % (ref 0.0–0.2)

## 2023-09-03 LAB — COMPREHENSIVE METABOLIC PANEL
ALT: 48 U/L — ABNORMAL HIGH (ref 0–44)
AST: 49 U/L — ABNORMAL HIGH (ref 15–41)
Albumin: 2.3 g/dL — ABNORMAL LOW (ref 3.5–5.0)
Alkaline Phosphatase: 198 U/L — ABNORMAL HIGH (ref 38–126)
Anion gap: 8 (ref 5–15)
BUN: 41 mg/dL — ABNORMAL HIGH (ref 8–23)
CO2: 20 mmol/L — ABNORMAL LOW (ref 22–32)
Calcium: 6.3 mg/dL — CL (ref 8.9–10.3)
Chloride: 118 mmol/L — ABNORMAL HIGH (ref 98–111)
Creatinine, Ser: 1.15 mg/dL — ABNORMAL HIGH (ref 0.44–1.00)
GFR, Estimated: 51 mL/min — ABNORMAL LOW (ref >60)
Glucose, Bld: 150 mg/dL — ABNORMAL HIGH (ref 70–99)
Potassium: 3.2 mmol/L — ABNORMAL LOW (ref 3.5–5.1)
Sodium: 146 mmol/L — ABNORMAL HIGH (ref 135–145)
Total Bilirubin: 2.6 mg/dL — ABNORMAL HIGH (ref <1.2)
Total Protein: 4.7 g/dL — ABNORMAL LOW (ref 6.5–8.1)

## 2023-09-03 LAB — BPAM PLATELET PHERESIS
Blood Product Expiration Date: 202412192359
ISSUE DATE / TIME: 202412171626
Unit Type and Rh: 5100

## 2023-09-03 LAB — PREPARE PLATELET PHERESIS: Unit division: 0

## 2023-09-03 LAB — GLUCOSE, CAPILLARY
Glucose-Capillary: 157 mg/dL — ABNORMAL HIGH (ref 70–99)
Glucose-Capillary: 123 mg/dL — ABNORMAL HIGH (ref 70–99)
Glucose-Capillary: 101 mg/dL — ABNORMAL HIGH (ref 70–99)
Glucose-Capillary: 137 mg/dL — ABNORMAL HIGH (ref 70–99)
Glucose-Capillary: 141 mg/dL — ABNORMAL HIGH (ref 70–99)

## 2023-09-03 LAB — MAGNESIUM: Magnesium: 1.6 mg/dL — ABNORMAL LOW (ref 1.7–2.4)

## 2023-09-03 MED ORDER — CALCIUM GLUCONATE-NACL 2-0.675 GM/100ML-% IV SOLN
2.0000 g | Freq: Once | INTRAVENOUS | Status: DC
  Administered 2023-09-03: 2000 mg via INTRAVENOUS
  Filled 2023-09-03: qty 100

## 2023-09-03 MED ORDER — POLYETHYLENE GLYCOL 3350 17 G PO PACK
17.0000 g | PACK | Freq: Every day | ORAL | Status: DC
  Administered 2023-09-03 – 2023-09-09 (×6): 17 g via ORAL
  Filled 2023-09-03 (×6): qty 1

## 2023-09-03 MED ORDER — BISACODYL 10 MG RE SUPP: 10.0000 mg | Freq: Every day | RECTAL | Status: DC | PRN

## 2023-09-03 MED ORDER — POTASSIUM CHLORIDE 20 MEQ PO PACK
40.0000 meq | PACK | Freq: Two times a day (BID) | ORAL | Status: AC
  Administered 2023-09-03 (×2): 40 meq via ORAL
  Filled 2023-09-03 (×2): qty 2

## 2023-09-03 MED ORDER — OXYCODONE HCL 5 MG PO TABS
2.5000 mg | ORAL_TABLET | ORAL | Status: DC | PRN
  Administered 2023-09-06 – 2023-09-09 (×6): 2.5 mg via ORAL
  Filled 2023-09-03 (×7): qty 1

## 2023-09-03 MED ORDER — MAGNESIUM SULFATE 2 GM/50ML IV SOLN
2.0000 g | Freq: Once | INTRAVENOUS | Status: DC
  Filled 2023-09-03: qty 50

## 2023-09-03 MED ORDER — DEXTROSE 5 % IV SOLN
30.0000 mmol | Freq: Once | INTRAVENOUS | Status: AC
  Administered 2023-09-03: 30 mmol via INTRAVENOUS
  Filled 2023-09-03: qty 10

## 2023-09-03 MED ORDER — SENNOSIDES-DOCUSATE SODIUM 8.6-50 MG PO TABS
3.0000 | ORAL_TABLET | Freq: Two times a day (BID) | ORAL | Status: DC
  Administered 2023-09-04 – 2023-09-09 (×7): 3 via ORAL
  Filled 2023-09-03 (×10): qty 3

## 2023-09-03 MED ORDER — DEXTROSE 5 % IV SOLN
INTRAVENOUS | Status: AC
Start: 2023-09-03 — End: 2023-09-04

## 2023-09-03 MED ORDER — POTASSIUM CHLORIDE 10 MEQ/100ML IV SOLN
10.0000 meq | INTRAVENOUS | Status: AC
  Administered 2023-09-03 (×4): 10 meq via INTRAVENOUS
  Filled 2023-09-03 (×4): qty 100

## 2023-09-03 MED ORDER — CALCIUM GLUCONATE-NACL 2-0.675 GM/100ML-% IV SOLN
2.0000 g | Freq: Once | INTRAVENOUS | Status: AC
  Administered 2023-09-03: 2000 mg via INTRAVENOUS
  Filled 2023-09-03: qty 100

## 2023-09-03 MED ORDER — MAGNESIUM SULFATE 2 GM/50ML IV SOLN
2.0000 g | Freq: Once | INTRAVENOUS | Status: AC
  Administered 2023-09-03: 2 g via INTRAVENOUS

## 2023-09-03 NOTE — Progress Notes (Signed)
Physical Therapy Treatment Patient Details Name: Mahrukh Tessendorf MRN: [id number] DOB: 04-18-1951 Today's Date: 09/03/2023   History of Present Illness Patient is a 72 year old female with a past medical history of breast cancer, anal cancer, and recent diagnosis of non-Hodgkin's lymphoma, TKA and THA, R shoulder sope,  on chemotherapy, hypertension, and GERD who was admitted on 08/24/2023 for management of decreased oral intake and increased lethargy at home.    PT Comments   Pt admitted with above diagnosis.  Pt currently with functional limitations due to the deficits listed below (see PT Problem List). Pt in bed when PT arrived. Daughter present and indicated pt lethargic secondary to pain medication. Pt roused and with encouragement agreeable to therapy intervention. Pt required max A x 2 for supine <> sit and total A to reposition in bed, mod/max for initial sitting balance and able to progress to CGA with B UE and LE support with cues, standing trials from EOB with max A x 2 and pull to stand at [facility]. Pt left in bed, all needs in place, nurse tech assisting with ADLs and daughter present. Pt appears to have guarded rehab potential and will require 24/7 supervision and assist in next venue. Pt will benefit from acute skilled PT to increase their independence and safety with mobility to allow discharge.      If plan is discharge home, recommend the following: Two people to help with walking and/or transfers;A lot of help with bathing/dressing/bathroom;Assist for transportation;Help with stairs or ramp for entrance;Assistance with cooking/housework   Can travel by private vehicle        Equipment Recommendations  Hospital bed;Wheelchair (measurements PT);Wheelchair cushion (measurements PT) (to dc home)    Recommendations for Other Services       Precautions / Restrictions Precautions Precautions: Fall Precaution Comments: critial Platelet and WBC levels Restrictions Weight Bearing  Restrictions Per Provider Order: No     Mobility  Bed Mobility Overal bed mobility: Needs Assistance Bed Mobility: Supine to Sit, Sit to Supine     Supine to sit: Max assist, +2 for physical assistance, +2 for safety/equipment, HOB elevated Sit to supine: Max assist, +2 for physical assistance, +2 for safety/equipment   General bed mobility comments: cues, increased time, minimal initiation, total A for scooting to [facility]    Transfers Overall transfer level: Needs assistance Equipment used: Rolling walker (2 wheels) Transfers: Sit to/from Stand Sit to Stand: Max assist, +2 physical assistance, +2 safety/equipment, From elevated surface           General transfer comment: trials with sit to stand from EOB and incorporated scooting to [facility] to the R, pt is unable to side step and when in sitting requires max A to reposition LEs with the exception 1 x for R LE pt able to lift R LE with B UE to slide to the R    Ambulation/Gait               General Gait Details: NT   Stairs             Wheelchair Mobility     Tilt Bed    Modified Rankin (Stroke Patients Only)       Balance Overall balance assessment: Needs assistance Sitting-balance support: Feet supported, Bilateral upper extremity supported Sitting balance-Leahy Scale: Fair Sitting balance - Comments: pt able to progress from mod max for sitting balance to CGA and cues with flexed trunk and tolerated EOB for 7 mins   Standing balance support: Bilateral upper  extremity supported, During functional activity, Reliant on assistive device for balance Standing balance-Leahy Scale: Zero Standing balance comment: max A x 2 for 3 breif standing trials                            Cognition Arousal: Lethargic, Suspect due to medications Behavior During Therapy: Flat affect, WFL for tasks assessed/performed Overall Cognitive Status: Difficult to assess                                  General Comments: daughter present and indicated pt lethargic due to pain medication        Exercises General Exercises - Lower Extremity Ankle Circles/Pumps: AAROM, Both, 10 reps Heel Slides: AAROM, Both, 5 reps    General Comments        Pertinent Vitals/Pain Pain Assessment Pain Assessment: Faces Faces Pain Scale: Hurts even more Pain Location: B LE R> L with donning socks and positioning B LE distal edema present Pain Intervention(s): Limited activity within patient's tolerance, Monitored during session, Repositioned    Home Living                          Prior Function            PT Goals (current goals can now be found in the care plan section) Acute Rehab PT Goals Patient Stated Goal: to get up PT Goal Formulation: With patient Time For Goal Achievement: 09/16/23 Potential to Achieve Goals: Poor Progress towards PT goals: Progressing toward goals    Frequency    Min 1X/week      PT Plan      Co-evaluation              AM-PAC PT "6 Clicks" Mobility   Outcome Measure  Help needed turning from your back to your side while in a flat bed without using bedrails?: Total Help needed moving from lying on your back to sitting on the side of a flat bed without using bedrails?: Total Help needed moving to and from a bed to a chair (including a wheelchair)?: Total Help needed standing up from a chair using your arms (e.g., wheelchair or bedside chair)?: Total Help needed to walk in hospital room?: Total Help needed climbing 3-5 steps with a railing? : Total 6 Click Score: 6    End of Session Equipment Utilized During Treatment: Gait belt Activity Tolerance: Patient limited by fatigue Patient left: in bed;with bed alarm set;with nursing/sitter in room;with family/visitor present Nurse Communication: Mobility status PT Visit Diagnosis: Muscle weakness (generalized) (M62.81)     Time: 0630-1601 PT Time Calculation (min) (ACUTE ONLY): 23  min  Charges:    $Therapeutic Activity: 23-37 mins PT General Charges $$ ACUTE PT VISIT: 1 Visit                     Johnny Bridge, PT Acute Rehab    Jacqualyn Posey 09/03/2023, 1:20 PM

## 2023-09-03 NOTE — Progress Notes (Addendum)
Speech Language Pathology Treatment: Dysphagia  Patient Details Name: Cynthia Velasquez MRN: [id number] DOB: 07/04/1951 Today's Date: 09/03/2023 Time: 1308-6578 SLP Time Calculation (min) (ACUTE ONLY): 16 min  Assessment / Plan / Recommendation Clinical Impression  Pt seen to assess po tolerance and readiness for dietary advancement. Pt alert today and talking - but she is talking about baking graham crackers and asking me about my baby indicative of confusion - ? If due to brain mets.    She was willing to consume po intake and was able to hold her own cup for self feeding.  Prolonged although adequate mastication of solids noted with full clearance. Strong cough x1 noted with sequential large boluses of thin liquids via straw.  Will advance diet to regular/thin (as her mentation is much improved) and called to order her a chocolate cupcake.   Hopefully this will improve her po intake overall.    Cynthia Velasquez is making good progress with effort and participation. Her spirits seem better today!   Will follow up x1 to assure tolerance of advancement and education for compensation when/if family is present.   [name] Kitchen   HPI HPI: 72 year old female with a past medical history of breast cancer, anal cancer, recent non-Hodgkin's lymphoma on chemotherapy, hypertension, and GERD who was admitted on 08/24/2023 for management of decreased oral intake and increased lethargy at home.  Patient had last received chemotherapy on 08/11/2023; 5 days has gotten progressively weaker, not been eating or drinking well, worsening abdominal pain, and worsening weakness.  thrombocytopenia, worsening liver failure, jaundice, and acute renal failure.  Imaging showing continued diffuse lymphadenopathy in setting of non-Hodgkin's lymphoma.  GI consulted for evaluation.  Patient is a 72 year old female with a past medical history of breast cancer, anal cancer, recent non-Hodgkin's lymphoma on chemotherapy, hypertension, and GERD who was  admitted on 08/24/2023 for management of decreased oral intake and increased lethargy at home.  Patient had last received chemotherapy on 08/11/2023; 5 days has gotten progressively weaker, not been eating or drinking well, worsening abdominal pain, and worsening weakness.  thrombocytopenia, worsening liver failure, jaundice, and acute renal failure.  Imaging showing continued diffuse lymphadenopathy in setting of non-Hodgkin's lymphoma.  GI consulted for evaluation.  12/9 Trace bilateral pleural effusions. Trace upper abdominal ascites. Body wall edema.  MD changed diet to dys1/nectar.      SLP Plan  Continue with current plan of care      Recommendations for follow up therapy are one component of a multi-disciplinary discharge planning process, led by the attending physician.  Recommendations may be updated based on patient status, additional functional criteria and insurance authorization.    Recommendations  Diet recommendations: Dysphagia 3 (mechanical soft);Thin liquid Liquids provided via: Cup;Straw Medication Administration: Other (Comment) Supervision:  (as tolerated) Compensations: Slow rate;Small sips/bites;Minimize environmental distractions Postural Changes and/or Swallow Maneuvers: Seated upright 90 degrees                  Oral care BID   Frequent or constant Supervision/Assistance Dysphagia, oral phase (R13.11)     Continue with current plan of care    Cynthia Infante, MS [facility] SLP Acute Rehab Services Office [phone]  Chales Abrahams  09/03/2023, 5:27 PM

## 2023-09-03 NOTE — NC FL2 (Signed)
[place] MEDICAID FL2 LEVEL OF CARE FORM     IDENTIFICATION  Patient Name: Cynthia Velasquez Birthdate: 04-13-1951 Sex: female Admission Date (Current Location): 08/24/2023  [facility] and [place] Number:  Producer, television/film/video and Address:  [facility],  [address] 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) 08/26/2023   Acute metabolic encephalopathy 08/25/2023   Elevated liver enzymes 08/25/2023   Neutropenia (HCC) 08/25/2023   Encephalopathy 08/24/2023   Palliative care encounter 08/24/2023   Generalized weakness 08/24/2023   Hypercalcemia 08/24/2023   Hyperbilirubinemia 08/24/2023   High risk medication use 08/24/2023   Cancer associated pain 08/24/2023   Medication management 08/24/2023   Need for emotional support 08/24/2023   Counseling regarding advance care planning and goals of care 08/24/2023   Biliary obstruction 08/01/2023   Transaminitis 08/01/2023   Generalized abdominal pain 08/01/2023   AKI (acute kidney injury) (HCC) 08/01/2023   Depression 08/01/2023   GAD (generalized anxiety disorder) 08/01/2023   Syncope and collapse 12/29/2022   Hypomagnesemia 12/29/2022   Perianal pain 11/05/2022   Vaginal candidiasis 11/04/2022   Hypokalemia 10/28/2022   Pancytopenia (HCC) 10/28/2022   Port-A-Cath in place 10/01/2022   Chemotherapy-induced neuropathy (HCC) 09/03/2022   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    Incontinence of feces 09/28/2020   Essential hypertension 09/27/2020   GERD without esophagitis 09/27/2020   History of bilateral mastectomy 09/27/2020   Hyperglycemia 09/27/2020   Moderate recurrent major depression (HCC) 09/27/2020   Pure hypercholesterolemia 09/27/2020   Anal cancer (HCC) 06/13/2019   Status post total shoulder arthroplasty, right 09/12/2016   Arthritis of knee 02/27/2015   Primary osteoarthritis of right knee Valgus 02/24/2015   Neuropathy 09/07/2013   Breast cancer of upper-outer quadrant of left female breast (HCC) 08/09/2013   Breast cancer of upper-outer quadrant of right female breast (HCC) 08/09/2013   Osteoarthritis of right hip 05/03/2013   Osteoarthritis of left hip 02/15/2013    Orientation RESPIRATION BLADDER Height & Weight     Self, Time, Situation, Place  Normal Continent Weight: 178 lb 3.2 oz (80.8 kg) Height:  5\' 5"  (165.1 cm)  BEHAVIORAL SYMPTOMS/MOOD NEUROLOGICAL BOWEL NUTRITION STATUS      Incontinent Diet (See discharge summary)  AMBULATORY STATUS COMMUNICATION OF NEEDS Skin   Extensive Assist Verbally Normal                       Personal Care Assistance Level of Assistance  Bathing, Feeding, Dressing Bathing Assistance: Maximum assistance Feeding assistance: Limited assistance Dressing Assistance: Maximum assistance     Functional Limitations Info  Sight, Hearing, Speech Sight Info: Impaired Hearing Info: Impaired Speech Info: Adequate    SPECIAL CARE FACTORS FREQUENCY  PT (By licensed PT), OT (By licensed OT)     PT Frequency: 5x/wk OT Frequency: 5x/wk            Contractures Contractures Info: Not present    Additional Factors Info  Code Status, Allergies Code Status Info: DNR Allergies Info: Tramadol, Belinostat, Codeine, Vicodin (Hydrocodone-acetaminophen)           Current Medications (09/03/2023):  This is the current hospital active medication list Current Facility-Administered Medications   Medication Dose Route Frequency Provider Last Rate Last Admin   0.9 %  sodium chloride infusion (Manually program via Guardrails IV Fluids)   Intravenous Once Anthoney Harada, NP       Chlorhexidine Gluconate Cloth 2 % PADS 6 each  6 each Topical Daily Kirby Crigler, Mir M, MD   6 each at 09/03/23 1052   dextrose 5 % solution   Intravenous Continuous Amin, Ankit C, MD 75 mL/hr at 09/03/23 0846 New Bag at 09/03/23 0846   feeding supplement (BOOST / RESOURCE BREEZE) liquid 1 Container  1 Container Oral TID BM Rolly Salter, MD   1 Container at 08/31/23 2150   fluconazole (DIFLUCAN) tablet 100 mg  100 mg Oral Daily Rolly Salter, MD   100 mg at 09/03/23 1052   hydrALAZINE (APRESOLINE) injection 10 mg  10 mg Intravenous Q4H PRN Amin, Ankit C, MD       HYDROmorphone (DILAUDID) injection 0.2-0.5 mg  0.2-0.5 mg Intravenous Q3H PRN Alena Bills, DO   0.5 mg at 09/01/23 1826   iohexol (OMNIPAQUE) 300 MG/ML solution 30 mL  30 mL Oral Once PRN Rolly Salter, MD       ipratropium-albuterol (DUONEB) 0.5-2.5 (3) MG/3ML nebulizer solution 3 mL  3 mL Nebulization Q4H PRN Amin, Ankit C, MD       methylPREDNISolone sodium succinate (SOLU-MEDROL) 125 mg/2 mL injection 125 mg  125 mg Intravenous BID Lewie Chamber, MD   125 mg at 09/03/23 1053   metoprolol tartrate (LOPRESSOR) injection 5 mg  5 mg Intravenous Q4H PRN Amin, Ankit C, MD       ondansetron (ZOFRAN) tablet 4 mg  4 mg Oral Q6H PRN Kirby Crigler, Mir M, MD       Or   ondansetron [facility]) injection 4 mg  4 mg Intravenous Q6H PRN Kirby Crigler, Mir M, MD       oxyCODONE (Oxy IR/ROXICODONE) immediate release tablet 2.5 mg  2.5 mg Oral Q4H PRN Mims, Lauren W, DO       pantoprazole (PROTONIX) EC tablet 40 mg  40 mg Oral Daily Norva Pavlov, RPH   40 mg at 09/03/23 1051   polyethylene glycol (MIRALAX / GLYCOLAX) packet 17 g  17 g Oral Daily Mims, Lauren W, DO   17 g at 09/03/23 1303   potassium chloride (KLOR-CON) packet 40 mEq  40 mEq Oral BID Amin, Ankit  C, MD   40 mEq at 09/03/23 1051   potassium PHOSPHATE 30 mmol in dextrose 5 % 500 mL infusion  30 mmol Intravenous Once Amin, Ankit C, MD 85 mL/hr at 09/03/23 1217 30 mmol at 09/03/23 1217   senna-docusate (Senokot-S) tablet 3 tablet  3 tablet Oral BID Mims, Lauren W, DO       sodium chloride flush (NS) 0.9 % injection 10-40 mL  10-40 mL Intracatheter PRN Kirby Crigler, Mir M, MD       traZODone (DESYREL) tablet 25 mg  25 mg Oral QHS PRN Maryln Gottron, MD         Discharge Medications: Please see discharge summary for a list of discharge medications.  Relevant Imaging Results:  Relevant Lab Results:   Additional Information SSN: [id number]  Otelia Santee, LCSW

## 2023-09-03 NOTE — Progress Notes (Signed)
Daily Progress Note   Patient Name: Cynthia Velasquez       Date: 09/03/2023 DOB: 1951/08/08  Age: 72 y.o. MRN#: [id number] Attending Physician: Miguel Rota, MD Primary Care Physician: [name], MD Admit Date: 08/24/2023 Length of Stay: 9 days  Reason for Consultation/Follow-up: Establishing goals of care  Subjective:   CC: Patient continues to feel motivated to work with PT/OT.  Following up regarding complex medical decision making.  Subjective:  Reviewed EMR prior to presenting to bedside.  Presented to bedside to meet with patient.  When presenting to bedside, patient's daughter present as well.  Patient's daughter remembered this provider from meeting in ER upon patient's admission. Patient noted she had visitors yesterday for her birthday which was a nice celebration.  Able to discuss patient's care for today.  Patient had received pain medication earlier for abdominal pain.  Inquired about last bowel movement which patient noted had not been recently.  Discussed importance of regular bowel movements and will increase scheduled medications and bowel regimen so hopefully this can alleviate any abdominal pain.  Patient noted she is still motivated to work with PT/OT.  Hoping they can come by today.  Daughter at bedside encouraging this.  Noted importance of continuing to work with them if she is willing to go to rehab.  Patient again states she is willing to go to rehab to regain her strength before going home.  Discussed alternative pathway should patient decide she no longer wants to focus on physical therapy would be to go home with hospice support.  Patient acknowledges this and that she wants to continue working with PT.  Acknowledged patient's wishes and noted palliative medicine team will continue to follow with patient's medical journey.  Spoke with patient's daughter separately at her request.  Reviewed patient's medical journey.  Again discussed at this time patient's body is  leaning away.  Noted patient's prognosis will overall be related to her functional status.  If patient can improve her functional status and nutrition, could potentially benefit from rehab.  Also discussed that patient can rapidly deteriorate at any time and then would need to focus on her comfort.  Daughter acknowledged this.  Discussed we will continue appropriate medical interventions at this time.  Time providing and support.  All questions answered at that time.  Updated IDT regarding discussion.  Objective:   Vital Signs:  BP (!) 142/91 (BP Location: Right Arm)   Pulse 62   Temp 98 F (36.7 C)   Resp 16   Ht 5\' 5"  (1.651 m)   Wt 80.8 kg   SpO2 100%   BMI 29.65 kg/m   Physical Exam: General: NAD, alert, pleasant, chronically ill appearing  Cardiovascular: Bradycardia noted Respiratory: no increased work of breathing noted, not in respiratory distress Skin: no rashes or lesions on visible skin Neuro: Alert, interactive, following commands easily Psych: appropriately answers all questions  Imaging: I personally reviewed recent imaging.   Assessment & Plan:   Assessment: Patient is a 72 year old female with a past medical history of breast cancer, anal cancer, and recent diagnosis of non-Hodgkin's lymphoma on chemotherapy, hypertension, and GERD who was admitted on 08/24/2023 for management of decreased oral intake and increased lethargy at home.  Patient had last received chemotherapy on 08/11/2023; under the care of oncologist Dr. Candise Che and coordination of care with [facility] oncology.  Patient's daughter arrived that patient had initially been doing well after chemotherapy though over the past 5 days has gotten progressively weaker,  not been eating or drinking well, worsening abdominal pain, and worsening weakness.  Upon presentation to the ER, lab work showing evidence of leukopenia, thrombocytopenia, worsening liver failure, jaundice, and acute renal failure.  Imaging showing  continued diffuse lymphadenopathy in setting of non-Hodgkin's lymphoma.  GI consulted for evaluation; MRCP recommended and has been ordered to obtain.  Oncology has been consulted for evaluation.  Palliative medicine team consulted to assist with complex medical decision making.   Recommendations/Plan: # Complex medical decision making/goals of care:    -Discussed care with patient and daughter as detailed above in HPI.  Patient has already voice she has heard that she has multiple medical illnesses.  Patient was functioning and working prior to this deterioration.  Patient continues to express desire to regain her strength to be as functional as she can as that is important to her quality of life.  Patient stated at that visit she would be willing to participate in rehab if needed before going home.  Patient willing to continue working with PT/OT.  Have discussed with patient and daughter that should patient not be appropriate for rehab, pathway to go home with hospice as alternative option available if needed.  Daughter planning to encourage participation with PT/OT and nutritional intake.                  Code Status: Limited: Do not attempt resuscitation (DNR) -DNR-LIMITED -Do Not Intubate/DNI    # Symptom management                -Pain, acute in setting of non-Hodgkin's lymphoma                               -Continue IV Dilaudid to 0.2-0.5 mg every 3 hours as needed for severe pain                               -Change oral oxycodone to 2.5 mg every 4 hours as needed   # Psycho-social/Spiritual Support:  - Support System: daughter  # Discharge Planning: To Be Determined  Discussed with: patient, patient's daughter, PT/OT, hospitalist   Thank you for allowing the palliative care team to participate in the care Cynthia Velasquez.  [name], DO Palliative Care Provider PMT # [phone]  If patient remains symptomatic despite maximum doses, please call PMT at [phone] between 0700  and 1900. Outside of these hours, please call attending, as PMT does not have night coverage.  Personally spent 51 minutes in patient care including extensive chart review (labs, imaging, progress/consult notes, vital signs), medically appropraite exam, discussed with treatment team, education to patient, family, and staff, documenting clinical information, medication review and management, coordination of care, and available advanced directive documents.   *Please note that this is a verbal dictation therefore any spelling or grammatical errors are due to the "Dragon [employer] One" system interpretation.

## 2023-09-03 NOTE — Telephone Encounter (Signed)
Connected with [employer] [phone]) regarding fax requesting medical records from 04/17/2023 to present for claim no.#: [id number].  Third request.since 07/16/2023.  Searched output per (SW) [facility].I.M. Releases tab, "Completed 07/25/2023 with letter (SW) H.I.M is unable to comply, reason: invalid facility name.  Medical release tip sheet included."  Marin Roberts Customer Service Representative asked what are we to do to obtain records.  Advised to: Address request to our legal name "[facility]" which our clinic falls under this five campus system. Ensure the [employer]'s signed authorization is submitted with the request. No further questions or needs. Patient currently admitted.

## 2023-09-03 NOTE — Progress Notes (Signed)
   09/03/23 1400  Spiritual Encounters  Type of Visit Initial  Care provided to: Pt and family  Referral source Chaplain assessment  Reason for visit Routine spiritual support  OnCall Visit No  Interventions  Spiritual Care Interventions Made Established relationship of care and support;Compassionate presence;Reflective listening;Prayer   While rounding chaplain visited with patient and her daughters. Patient stated that she is feeling a lot better today and trying to make peace with things. Chaplain prayed with patient and her daughters and gave her a blessings. Chaplain services are available for spiritual and emotional support when needed.

## 2023-09-03 NOTE — TOC Progression Note (Signed)
Transition of Care [facility]) - Progression Note    Patient Details  Name: Cynthia Velasquez MRN: [id number] Date of Birth: [birth year]  Transition of [facility]) CM/SW Contact  Otelia Santee, LCSW Phone Number: 09/03/2023, 3:10 PM  Clinical Narrative:    Pt more engaged and willing to work with PT for rehab. Pt and daughter are agreeable to begin search for SNF placement for pt. Pt's daughter does not currently have a preference for a facility.  Referrals have been sent out and currently awaiting bed offers.    Expected Discharge Plan: Home/Self Care Barriers to Discharge: Continued Medical Work up  Expected Discharge Plan and Services In-house Referral: NA Discharge Planning Services: NA Post Acute Care Choice:  (Unknown) Living arrangements for the past 2 months: Apartment                                       Social Determinants of Health (SDOH) Interventions SDOH Screenings   Food Insecurity: No Food Insecurity (08/24/2023)  Housing: Low Risk  (08/28/2023)  Recent Concern: Housing - Medium Risk (08/24/2023)  Transportation Needs: No Transportation Needs (08/24/2023)  Utilities: Not At Risk (08/24/2023)  Social Connections: Unknown (01/29/2022)   [facility] from [facility]  Tobacco Use: Low Risk  (08/24/2023)    Readmission Risk Interventions    08/25/2023    1:23 PM 08/01/2023   12:15 PM 10/30/2022    2:47 PM  Readmission Risk Prevention Plan  Transportation Screening Complete Complete Complete  PCP or Specialist Appt within 5-7 Days  Complete Complete  PCP or Specialist Appt within 3-5 Days Complete    Home Care Screening  Complete Complete  Medication Review (RN CM)  Complete Complete  HRI or Home Care Consult Complete    Social Work Consult for Recovery Care Planning/Counseling Complete    Palliative Care Screening Complete    Medication Review Oceanographer) Complete

## 2023-09-03 NOTE — Progress Notes (Signed)
PROGRESS NOTE    Cynthia Velasquez  GEX:[id number] DOB: April 03, 1951 DOA: 08/24/2023 PCP: [name], MD    Brief Narrative:  72 year old with history of multiple cancers including breast cancer, anal cancer, recent lymphoma, HTN, HLD, depression, GERD admitted to the hospital for lethargy, confusion and weakness.  Last chemotherapy was on 11/25 but over the past week prior to admission she became progressively weak.  Upon admission noted to have pancytopenia, hypercalcemia, encephalopathy, transaminitis.  Patient has been seen by both GI oncology who are both recommending that at this time patient should be transition to hospice.  Palliative care team is following.  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  Patient has been through quite a bit with several treatments for her multiple different cancers.  This is significantly taken toll on her body and at this time recommendations are to transition her to hospice and provide supportive care/comfort as much as possible.  Palliative care team is following. - status post 1 unit platelet transfusion 12/17.  Acute metabolic  encephalopathy - Overall progression of her underlying malignancy.  MRI of the brain is negative for any pathology at this time.  Hypercalcemia/hypernatremia, slightly worsening Hypokalemia - Related to her malignancy.  On Solu-Medrol to help with hypercalcemia - Continue D5 water. - Replete electrolytes  Transaminitis Acute Pancreatitis.  - Multiple ongoing issues.  Likely secondary to her malignancy.  MRCP negative.  No further workup per GI  AKI on CKD stage IIIb - Baseline creatinine around 1.0.  Creatinine peaked at 2.05.  Slowly has been trending up over the last several weeks  Essential hypertension -IV as needed  History of breast cancer status postmastectomy -In remission  Unfortunately given her advanced age, multiple comorbidities, poor and inconsistent oral intake she has very poor prognosis.  Highly recommend comfort care/hospice at this time.  Palliative care team is following.  PT/OT-SNF  DVT prophylaxis: SCDs Start: 08/24/23 1205 Code Status: Full Code Family Communication:  Called [name], no answer.  Status is: Inpatient Remains inpatient appropriate because: Electrolyte correction over next 24-48 hours thereafter SNF placement   Subjective: Doing ok Poor PO intake. Waiting for PT to see her again today  Examination:  General exam: Appears calm and comfortable . Weak and frail Respiratory system: Clear to auscultation. Respiratory effort normal. Cardiovascular system: S1 & S2 heard, RRR. No JVD, murmurs, rubs, gallops or clicks. No pedal edema. Gastrointestinal system: Abdomen is nondistended, soft and nontender. No organomegaly or masses felt. Normal bowel sounds heard. Central nervous system: Alert and oriented. No focal neurological deficits. Extremities: Symmetric 4 x 5 power. Skin: No rashes, lesions or ulcers Psychiatry: Judgement and insight appear normal. Mood & affect appropriate.  Diet Orders (From admission, onward)      Start     Ordered   08/28/23 1537  Diet full liquid Room service appropriate? Yes; Fluid consistency: Thin  Diet effective now       Question Answer Comment  Room service appropriate? Yes   Fluid consistency: Thin      08/28/23 1536            Objective: Vitals:   09/02/23 1927 09/02/23 1953 09/03/23 0409 09/03/23 1203  BP: 137/79 139/77 (!) 142/91 (!) 153/76  Pulse: 64 64 62 (!) 54  Resp: 16 16 16 20   Temp: 98.4 F (36.9 C) 98.5 F (36.9 C) 98 F (36.7 C)   TempSrc: Oral     SpO2: 100% 100% 100% 99%  Weight:      Height:        Intake/Output Summary (Last 24 hours) at 09/03/2023 1221 Last data filed at 09/03/2023 0500 Gross per 24 hour  Intake 1384.48 ml  Output 2700 ml  Net -1315.52 ml   Filed Weights   08/24/23 1331  Weight: 80.8 kg    Scheduled Meds:  sodium chloride   Intravenous Once   Chlorhexidine Gluconate Cloth  6 each Topical Daily   feeding supplement  1 Container Oral TID BM   fluconazole  100 mg Oral Daily   methylPREDNISolone (SOLU-MEDROL) injection  125 mg Intravenous BID   pantoprazole  40 mg Oral Daily   potassium chloride  40 mEq Oral BID   senna-docusate  2 tablet Oral BID   Continuous Infusions:  dextrose 75 mL/hr at 09/03/23 0846   potassium PHOSPHATE IVPB (in mmol) 30 mmol (09/03/23 1217)    Nutritional status     Body mass index is 29.65 kg/m.  Data Reviewed:   CBC: Recent Labs  Lab 08/30/23 0230 08/30/23 1845 08/31/23 1445 09/01/23 0225 09/02/23 0219 09/03/23 0334  WBC 0.9* 1.0* 1.0* 1.0* 1.1* 1.1*  NEUTROABS 0.5*  --  0.7* 0.7* 0.7* 0.8*  HGB 10.3* 10.2* 10.4* 10.1* 10.0* 9.9*  HCT 32.4* 32.3* 32.4* 31.4* 30.8* 30.0*  MCV 91.3 92.0 90.0 91.0 91.1 91.2  PLT 10* 21* 11* 10* 7* 20*   Basic Metabolic Panel: Recent Labs  Lab 08/29/23 0342 08/30/23 0230 08/31/23 0248 09/01/23 0225 09/02/23 0219 09/03/23 0334  NA 141 144 145 149* 153* 146*  K 3.9 3.5 3.1* 3.3* 3.2* 3.2*  CL 116* 120* 119* 124* 125*  118*  CO2 18* 19* 20* 20* 21* 20*  GLUCOSE 162* 122* 115* 130* 151* 150*  BUN 64* 64* 63* 58* 52* 41*  CREATININE 1.88* 1.83* 1.49* 1.43* 1.48* 1.15*  CALCIUM 8.1* 7.5* 7.4* 7.2* 6.9* 6.3*  MG 2.0 2.0  --  2.1 1.8 1.6*  PHOS  --   --   --   --  2.1*  --    GFR: Estimated Creatinine Clearance: 46.4 mL/min (A) (by C-G formula based on SCr of 1.15 mg/dL (H)). Liver Function Tests: Recent Labs  Lab 08/30/23 0230 08/31/23 0248 09/01/23 0225 09/02/23 0219 09/03/23 0334  AST 70* 64* 59* 54* 49*  ALT 66* 63* 59* 52* 48*  ALKPHOS 262* 246* 250* 224* 198*  BILITOT 3.7* 4.1* 3.4* 3.0* 2.6*  PROT 4.8* 5.0* 4.9* 4.7* 4.7*  ALBUMIN 2.4* 2.5* 2.5* 2.4* 2.3*   Recent Labs  Lab 08/31/23 1445 09/01/23 0225  LIPASE 34 33   No results for input(s): "AMMONIA" in the last 168 hours. Coagulation Profile: No results for input(s): "INR", "PROTIME" in  the last 168 hours. Cardiac Enzymes: No results for input(s): "CKTOTAL", "CKMB", "CKMBINDEX", "TROPONINI" in the last 168 hours. BNP (last 3 results) No results for input(s): "PROBNP" in the last 8760 hours. HbA1C: No results for input(s): "HGBA1C" in the last 72 hours. CBG: Recent Labs  Lab 09/02/23 1950 09/02/23 2354 09/03/23 0409 09/03/23 0729 09/03/23 1117  GLUCAP 145* 128* 157* 123* 101*   Lipid Profile: No results for input(s): "CHOL", "HDL", "LDLCALC", "TRIG", "CHOLHDL", "LDLDIRECT" in the last 72 hours. Thyroid Function Tests: No results for input(s): "TSH", "T4TOTAL", "FREET4", "T3FREE", "THYROIDAB" in the last 72 hours. Anemia Panel: No results for input(s): "VITAMINB12", "FOLATE", "FERRITIN", "TIBC", "IRON", "RETICCTPCT" in the last 72 hours. Sepsis Labs: Recent Labs  Lab 08/30/23 0230 08/30/23 1839  LATICACIDVEN 2.3* 1.8    Recent Results (from the past 240 hours)  MRSA Next Gen by PCR, Nasal     Status: None   Collection Time: 08/24/23  6:22 PM   Specimen: Nasal Mucosa; Nasal Swab  Result Value Ref Range Status    MRSA by PCR Next Gen NOT DETECTED NOT DETECTED Final    Comment: (NOTE) The GeneXpert MRSA Assay (FDA approved for NASAL specimens only), is one component of a comprehensive MRSA colonization surveillance program. It is not intended to diagnose MRSA infection nor to guide or monitor treatment for MRSA infections. Test performance is not FDA approved in patients less than 69 years old. Performed at [facility], [address], [place], [place] [address]          Radiology Studies: No results found.         LOS: 9 days   Time spent= 35 mins    Miguel Rota, MD [facility]  If 7PM-7AM, please contact night-coverage  09/03/2023, 12:21 PM

## 2023-09-04 DIAGNOSIS — N179 Acute kidney failure, unspecified: Secondary | ICD-10-CM | POA: Diagnosis not present

## 2023-09-04 DIAGNOSIS — G9341 Metabolic encephalopathy: Secondary | ICD-10-CM | POA: Diagnosis not present

## 2023-09-04 DIAGNOSIS — Z515 Encounter for palliative care: Secondary | ICD-10-CM | POA: Diagnosis not present

## 2023-09-04 DIAGNOSIS — G893 Neoplasm related pain (acute) (chronic): Secondary | ICD-10-CM | POA: Diagnosis not present

## 2023-09-04 DIAGNOSIS — R748 Abnormal levels of other serum enzymes: Secondary | ICD-10-CM | POA: Diagnosis not present

## 2023-09-04 DIAGNOSIS — R531 Weakness: Secondary | ICD-10-CM | POA: Diagnosis not present

## 2023-09-04 LAB — COMPREHENSIVE METABOLIC PANEL
ALT: 47 U/L — ABNORMAL HIGH (ref 0–44)
AST: 51 U/L — ABNORMAL HIGH (ref 15–41)
Albumin: 2.2 g/dL — ABNORMAL LOW (ref 3.5–5.0)
Alkaline Phosphatase: 188 U/L — ABNORMAL HIGH (ref 38–126)
Anion gap: 10 (ref 5–15)
BUN: 37 mg/dL — ABNORMAL HIGH (ref 8–23)
CO2: 19 mmol/L — ABNORMAL LOW (ref 22–32)
Calcium: 6.3 mg/dL — CL (ref 8.9–10.3)
Chloride: 114 mmol/L — ABNORMAL HIGH (ref 98–111)
Creatinine, Ser: 1.21 mg/dL — ABNORMAL HIGH (ref 0.44–1.00)
GFR, Estimated: 48 mL/min — ABNORMAL LOW (ref >60)
Glucose, Bld: 135 mg/dL — ABNORMAL HIGH (ref 70–99)
Potassium: 4.3 mmol/L (ref 3.5–5.1)
Sodium: 143 mmol/L (ref 135–145)
Total Bilirubin: 2.2 mg/dL — ABNORMAL HIGH (ref <1.2)
Total Protein: 4.4 g/dL — ABNORMAL LOW (ref 6.5–8.1)

## 2023-09-04 LAB — CBC WITH DIFFERENTIAL/PLATELET
Abs Immature Granulocytes: 0.01 10*3/uL (ref 0.00–0.07)
Basophils Absolute: 0 10*3/uL (ref 0.0–0.1)
Basophils Relative: 2 %
Eosinophils Absolute: 0 10*3/uL (ref 0.0–0.5)
Eosinophils Relative: 0 %
HCT: 28.4 % — ABNORMAL LOW (ref 36.0–46.0)
Hemoglobin: 9.3 g/dL — ABNORMAL LOW (ref 12.0–15.0)
Immature Granulocytes: 1 %
Lymphocytes Relative: 30 %
Lymphs Abs: 0.3 10*3/uL — ABNORMAL LOW (ref 0.7–4.0)
MCH: 29.5 pg (ref 26.0–34.0)
MCHC: 32.7 g/dL (ref 30.0–36.0)
MCV: 90.2 fL (ref 80.0–100.0)
Monocytes Absolute: 0.1 10*3/uL (ref 0.1–1.0)
Monocytes Relative: 9 %
Neutro Abs: 0.7 10*3/uL — ABNORMAL LOW (ref 1.7–7.7)
Neutrophils Relative %: 58 %
Platelets: 14 10*3/uL — CL (ref 150–400)
RBC: 3.15 MIL/uL — ABNORMAL LOW (ref 3.87–5.11)
RDW: 26.6 % — ABNORMAL HIGH (ref 11.5–15.5)
WBC: 1.1 10*3/uL — CL (ref 4.0–10.5)
nRBC: 0 % (ref 0.0–0.2)

## 2023-09-04 LAB — MAGNESIUM: Magnesium: 1.7 mg/dL (ref 1.7–2.4)

## 2023-09-04 LAB — GLUCOSE, CAPILLARY
Glucose-Capillary: 120 mg/dL — ABNORMAL HIGH (ref 70–99)
Glucose-Capillary: 126 mg/dL — ABNORMAL HIGH (ref 70–99)
Glucose-Capillary: 129 mg/dL — ABNORMAL HIGH (ref 70–99)
Glucose-Capillary: 129 mg/dL — ABNORMAL HIGH (ref 70–99)
Glucose-Capillary: 163 mg/dL — ABNORMAL HIGH (ref 70–99)
Glucose-Capillary: 99 mg/dL (ref 70–99)

## 2023-09-04 LAB — PHOSPHORUS: Phosphorus: 3.1 mg/dL (ref 2.5–4.6)

## 2023-09-04 MED ORDER — CALCIUM GLUCONATE-NACL 1-0.675 GM/50ML-% IV SOLN
1.0000 g | Freq: Once | INTRAVENOUS | Status: AC
  Administered 2023-09-04: 1000 mg via INTRAVENOUS
  Filled 2023-09-04: qty 50

## 2023-09-04 MED ORDER — SODIUM BICARBONATE 650 MG PO TABS
650.0000 mg | ORAL_TABLET | Freq: Two times a day (BID) | ORAL | Status: DC
  Administered 2023-09-04 (×2): 650 mg via ORAL
  Filled 2023-09-04 (×3): qty 1

## 2023-09-04 MED ORDER — MAGNESIUM SULFATE 2 GM/50ML IV SOLN
2.0000 g | Freq: Once | INTRAVENOUS | Status: AC
  Administered 2023-09-04: 2 g via INTRAVENOUS
  Filled 2023-09-04: qty 50

## 2023-09-04 NOTE — Plan of Care (Signed)
?  Problem: Education: ?Goal: Knowledge of General Education information will improve ?Description: Including pain rating scale, medication(s)/side effects and non-pharmacologic comfort measures ?Outcome: Progressing ?  ?Problem: Clinical Measurements: ?Goal: Ability to maintain clinical measurements within normal limits will improve ?Outcome: Progressing ?  ?Problem: Nutrition: ?Goal: Adequate nutrition will be maintained ?Outcome: Progressing ?  ?Problem: Elimination: ?Goal: Will not experience complications related to bowel motility ?Outcome: Progressing ?  ?

## 2023-09-04 NOTE — Progress Notes (Signed)
Date and time results received: 09/04/23 0405 (use smartphrase ".now" to insert current time)  Test: BMP Critical Value: Calcium = 6.3  Name of Provider Notified: NP on cal [employer] Manifold  Orders Received? Or Actions Taken?:  waiting for order.

## 2023-09-04 NOTE — Progress Notes (Signed)
Daily Progress Note   Patient Name: Cynthia Velasquez       Date: 09/04/2023 DOB: 10-24-1950  Age: 72 y.o. MRN#: [id number] Attending Physician: Merlene Laughter, DO Primary Care Physician: [name], MD Admit Date: 08/24/2023 Length of Stay: 10 days  Reason for Consultation/Follow-up: Establishing goals of care  Subjective:   CC: Patient has expressed desire to continue working with PT/OT.  Following up regarding complex medical decision making.  Subjective:  Reviewed EMR prior to presenting to bedside.  TOC assisting with providing daughter possible facilities for rehab.  Admitted to meet with patient.  Patient resting comfortably in the bed.  Patient's daughter present at bedside.  Discussed continued participation with PT hopefully later today.  Daughter attempting to encourage oral intake.  Noted that hospitalist is continuing to address patient's electrolyte abnormalities.  Again emphasized importance of oral intake if wants to be able to regain strength and actually go to rehab.  Daughter acknowledges.  All questions answered at that time.  Noted palliative medicine team continue to follow with patient's medical journey.  Objective:   Vital Signs:  BP 127/74 (BP Location: Left Arm)   Pulse (!) 59   Temp 98.4 F (36.9 C) (Oral)   Resp 17   Ht 5\' 5"  (1.651 m)   Wt 80.8 kg   SpO2 98%   BMI 29.65 kg/m   Imaging: I personally reviewed recent imaging.   Assessment & Plan:   Assessment: Patient is a 72 year old female with a past medical history of breast cancer, anal cancer, and recent diagnosis of non-Hodgkin's lymphoma on chemotherapy, hypertension, and GERD who was admitted on 08/24/2023 for management of decreased oral intake and increased lethargy at home.  Patient had last received chemotherapy on 08/11/2023; under the care of oncologist Dr. Candise Che and coordination of care with [facility] oncology.  Patient's daughter arrived that patient had initially been doing well  after chemotherapy though over the past 5 days has gotten progressively weaker, not been eating or drinking well, worsening abdominal pain, and worsening weakness.  Upon presentation to the ER, lab work showing evidence of leukopenia, thrombocytopenia, worsening liver failure, jaundice, and acute renal failure.  Imaging showing continued diffuse lymphadenopathy in setting of non-Hodgkin's lymphoma.  GI consulted for evaluation; MRCP recommended and has been ordered to obtain.  Oncology has been consulted for evaluation.  Palliative medicine team consulted to assist with complex medical decision making.   Recommendations/Plan: # Complex medical decision making/goals of care:    -Has continued discussions with patient and daughter.  Patient has continued to voice she has heard that she has multiple medical illnesses.  Patient was functioning and working prior to this deterioration.  Patient continues to express desire to regain her strength to be as functional as she can as that is important to her quality of life.  Patient agreed she would be willing to participate at a rehab if needed before going home.  Patient willing to continue working with PT/OT.  Have discussed with patient and daughter that should patient not be appropriate for rehab, pathway to go home with hospice as alternative option available if needed.  Daughter continues to encourage participation with PT/OT and nutritional intake.                  Code Status: Limited: Do not attempt resuscitation (DNR) -DNR-LIMITED -Do Not Intubate/DNI    # Symptom management                -Pain,  acute in setting of non-Hodgkin's lymphoma                               -Discontinue IV Dilaudid                                -Change oral oxycodone to 2.5 mg every 4 hours as needed   # Psycho-social/Spiritual Support:  - Support System: daughter  # Discharge Planning: To Be Determined  Discussed with: patient, patient's daughter  Thank you for  allowing the palliative care team to participate in the care [name].  [name], DO Palliative Care Provider PMT # [phone]  If patient remains symptomatic despite maximum doses, please call PMT at [phone] between 0700 and 1900. Outside of these hours, please call attending, as PMT does not have night coverage.  Personally spent 26 minutes in patient care including extensive chart review (labs, imaging, progress/consult notes, vital signs), medically appropraite exam, discussed with treatment team, education to patient, family, and staff, documenting clinical information, medication review and management, coordination of care, and available advanced directive documents.   *Please note that this is a verbal dictation therefore any spelling or grammatical errors are due to the "[employer]agon [employer] One" system interpretation.

## 2023-09-04 NOTE — Plan of Care (Signed)
  Problem: Education: Goal: Knowledge of General Education information will improve Description: Including pain rating scale, medication(s)/side effects and non-pharmacologic comfort measures Outcome: Progressing   Problem: Clinical Measurements: Goal: Will remain free from infection Outcome: Progressing   Problem: Nutrition: Goal: Adequate nutrition will be maintained Outcome: Progressing   Problem: Pain Management: Goal: General experience of comfort will improve Outcome: Progressing

## 2023-09-04 NOTE — TOC Progression Note (Signed)
Transition of Care [facility]) - Progression Note    Patient Details  Name: Cynthia Velasquez MRN: [id number] Date of Birth: Aug 11, 1951  Transition of [facility]) CM/SW Contact  Otelia Santee, LCSW Phone Number: 09/04/2023, 12:59 PM  Clinical Narrative:    Met with pt's daughter at bedside to review bed offers for SNF. Pt's daughter is to research and speak to family/friends prior to making selection. TOC will follow for SNF bed choice.    Expected Discharge Plan: Home/Self Care Barriers to Discharge: Continued Medical Work up  Expected Discharge Plan and Services In-house Referral: NA Discharge Planning Services: NA Post Acute Care Choice:  (Unknown) Living arrangements for the past 2 months: Apartment                                       Social Determinants of Health (SDOH) Interventions SDOH Screenings   Food Insecurity: No Food Insecurity (08/24/2023)  Housing: Low Risk  (08/28/2023)  Recent Concern: Housing - Medium Risk (08/24/2023)  Transportation Needs: No Transportation Needs (08/24/2023)  Utilities: Not At Risk (08/24/2023)  Social Connections: Unknown (01/29/2022)   [facility], [facility]  Tobacco Use: Low Risk  (08/24/2023)    Readmission Risk Interventions    09/04/2023   12:58 PM 08/25/2023    1:23 PM 08/01/2023   12:15 PM  Readmission Risk Prevention Plan  Transportation Screening Complete Complete Complete  PCP or Specialist Appt within 5-7 Days   Complete  PCP or Specialist Appt within 3-5 Days  Complete   Home Care Screening   Complete  Medication Review (RN CM)   Complete  HRI or Home Care Consult  Complete   Social Work Consult for Recovery Care Planning/Counseling  Complete   Palliative Care Screening  Complete   Medication Review Oceanographer) Complete Complete   PCP or Specialist appointment within 3-5 days of discharge Complete    HRI or Home Care Consult Complete    SW Recovery Care/Counseling Consult Complete     Palliative Care Screening Complete    Skilled Nursing Facility Complete

## 2023-09-04 NOTE — TOC CM/SW Note (Signed)
 CMS list of facilities and star ratings provided to pt to review for facility preference.       [facility] [address] [place], [place] [address] [phone] Overall rating ??  Below average  [facility] at the [facility] [address] [place], [place] [address] [phone] Overall rating ?? Below average  [facility] [address] [place], [place] [address] [phone] Overall rating?? Below average  [facility] [address] [place], [place] [address] [phone] Overall rating ? Much below average  [facility] [address] [place], [place] [address] [phone] Overall rating ??? Average  [facility] and [facility] [address] [place], [place] [address] [phone] Overall rating ? Much below average   [facility] [address] [place], [place] [address] [phone] Overall rating ? Much below average  [employer] [facility] [employer] [address] [place], [place] [address] [phone] Overall rating ??? Average  [facility] [address] [place], [place] [address] [phone] Overall rating ? Much below average  [facility] [address] [place], [place] [address] [phone] Overall rating ? Much below average  [facility] [address] [place], [place] [address] [phone] Overall rating ???? Above average  [facility] [address] [place], [place] [address] [phone] Overall rating ????? Much above average  [facility] [address] [place], [place] [address] [phone] Overall rating ???? Above average  [facility] [address] [place], [place]  [address] [phone] Overall rating ????? Much above average  [facility] [address] [place], [place] [address] [phone] Overall rating ????  [facility] [address] [place], [place] [address] [phone] Overall rating ????? Much above average  [place] [facility] [address] [place], [place] [address] [phone] Overall rating ????? Much above average  [facility] [address] [place], [place] [address] [phone] Overall rating ? Much below average  [facility] [name] [place], [place] [address] [phone] Overall rating ??? Average  [facility] [address] [place], [place] [address] [phone] Overall rating ????? Much above average  [facility] [address] [place], [place] [address] [phone] Overall rating ??? Average  [facility] [address] [place], [place] [address] [phone] Overall rating ?? Below average  [facility] [address] [place], [place] [address] [phone] Overall rating ??? Average  [facility] [address] [place], [place] [address] [phone] Overall rating ????? Much above average  [facility] [address] [place], [place] [address] [phone] Overall rating ? Much below average  [facility]	[place] [facility] the [facility] at [facility], [place] [address] [phone] Overall rating ????? Much above average  [facility] [address] [place], [place] [address] [phone] Overall rating ? Much below average  [facility] [address] [place], [place] [address] [phone] Overall rating ??? Average  [facility]  [facility] [address] [place], [place] [address] [phone] Overall rating ??  Below average  [facility] [address] [place], [place] [address] [phone] Overall rating ? Much below average  [facility] [facility] [address] [place], [place] [address] [phone] Overall rating ??? Average  [facility] [address] [place], [place] [address] [phone] Overall rating ? Much below average  [facility] [address] [place], [place] [address] [phone] Overall rating ??? Average  [facility] [address] [place], [place] [address] [phone] Overall rating ? Much below average  [place] [address] [place], [place] [address] [phone] Overall rating ????? Much above average  [facility] [address] [place], [place] [address] [phone] Overall rating ? Much below average  [facility] [address] [place], [place] [address] [phone] Overall rating ???? Above average  [facility] [address] [place], [place] [address] [phone] Overall rating ????? Above average  [facility] [address] [place], [place] [address] [phone] Overall rating ???? Above average  [facility] [address] [place], [place] [address] [phone] Overall rating ????? Much above average  [facility] [address] [place], [place] [address] [phone] Overall rating ? Much below average  [facility] [address] [place], [place] [address] [phone] Overall rating ????? Much above average  [facility] [address] [place], [place] [address] [phone] Overall rating ? Much below  average  [facility] [address] [place], [place] [address] [phone] Overall rating ????? Much above average  [facility] [address] [place], [place] [address] [phone] Overall rating ????? Much above average  [facility] [address] [place], [place] [address] [phone] Overall rating ??? Average  [facility] [address] [place], [place] [address] [phone] Overall rating ??? Average  [facility] [address] [place], [place] [address] [phone] Overall rating ???? Above average  [facility] [address] [place], [place] [address] [phone] Overall rating ?? Below average  [facility] [address] [place], [place] [address] [phone] Overall rating ???? Above average  [facility] [address] [place], [place] [address] [phone] Overall rating ?? Below average  [facility] [address] [place], [place] [address] [phone] Overall rating ??

## 2023-09-04 NOTE — Progress Notes (Signed)
PROGRESS NOTE    Cynthia Velasquez  XBJ:[id number] DOB: 1951-05-10 DOA: 08/24/2023 PCP: [name], MD   Brief Narrative:  The patient is 72 year old with history of multiple cancers including breast cancer, anal cancer, recent lymphoma, HTN, HLD, depression, GERD admitted to the hospital for lethargy, confusion and weakness.  Last chemotherapy was on 11/25 but over the past week prior to admission she became progressively weak.  Upon admission noted to have pancytopenia, hypercalcemia, encephalopathy, transaminitis.  Patient has been seen by both GI oncology who are both recommending that at this time patient should be transition to hospice.  Palliative care team is following.  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   -Patient has been through quite a bit with several treatments for her multiple different cancers.  -This is significantly taken toll on her body and recommendations were to transition her to hospice and provide supportive care/comfort as much as possible the patient is doing better and participating with therapy and wants to go to SNF with palliative to  follow.  Palliative care team is following. -status post 1 unit platelet transfusion 12/17. -CBC Trend: Recent Labs  Lab 08/30/23 0230 08/30/23 1845 08/31/23 1445 09/01/23 0225 09/02/23 0219 09/03/23 0334 09/04/23 0325  WBC 0.9* 1.0* 1.0* 1.0* 1.1* 1.1* 1.1*  HGB 10.3* 10.2* 10.4* 10.1* 10.0* 9.9* 9.3*  HCT 32.4* 32.3* 32.4* 31.4* 30.8* 30.0* 28.4*  MCV 91.3 92.0 90.0 91.0 91.1 91.2 90.2  PLT 10* 21* 11* 10* 7* 20* 14*  -Patient to follow-up outpatient setting with medical oncology for further discussion of chemotherapy -Recommending continuing oral intake and addressing of the electrolyte abnormalities -Repeat CBC in the AM  -TOC to assist with discharge disposition and I provided the patient and the family member a list of facilities and they have to make a selection and now obtain insurance authorization  Acute Metabolic Encephalopathy, improving -Overall progression of her underlying malignancy.  MRI of the brain is negative for any pathology at this time. -Na+ has Improved  -Continue delirium precautions and continue to address electrolyte abnormalities  Hypercalcemia Hypernatremia, improved Hypomagnesemia  Hypokalemia -Related to her malignancy.  On Solu-Medrol to help with hypercalcemia -Electrolyte Trend: Recent Labs  Lab 08/29/23 0342 08/30/23 0230 08/31/23 0248 09/01/23 0225 09/02/23 0219 09/03/23 0334 09/04/23 0325  NA 141 144 145 149* 153* 146* 143  K 3.9 3.5 3.1* 3.3* 3.2* 3.2* 4.3  MG 2.0 2.0  --  2.1 1.8 1.6* 1.7  CALCIUM 8.1* 7.5* 7.4* 7.2* 6.9* 6.3* 6.3*  -Replete with IV Mag Sulfate 2 grams and IV Calcium Gluconate 1 gram -Continue to Monitor and Trend and Repeat CMP, Mag, Phos within 1 week  Transaminitis/Abnormal LFTs Acute Pancreatitis.  -Multiple ongoing issues.  Likely  secondary to her malignancy.  MRCP negative.  No further workup per GI -LFT and Lipase Level Trend: Recent Labs  Lab 08/29/23 0342 08/30/23 0230 08/31/23 0248  08/31/23 1445 09/01/23 0225 09/02/23 0219 09/03/23 0334 09/04/23 0325  AST 85* 70* 64*  --  59* 54* 49* 51*  ALT 76* 66* 63*  --  59* 52* 48* 47*  LIPASE  --   --   --    < > 33  --   --   --    < > = values in this interval not displayed.  -Continue to Monitor and Trend and Repeat CMP in the AM   AKI on CKD stage IIIb -Baseline creatinine around 1.0.   -BUN/Cr Trend: Recent Labs  Lab 08/29/23 0342 08/30/23 0230 08/31/23 0248 09/01/23 0225 09/02/23 0219 09/03/23 0334 09/04/23 0325  BUN 64* 64* 63* 58* 52* 41* 37*  CREATININE 1.88* 1.83* 1.49* 1.43* 1.48* 1.15* 1.21*  -Creatinine peaked at 2.05.  Slowly has been trending up over the last several weeks -C/w D5W -Avoid Nephrotoxic Medications, Contrast Dyes, Hypotension and Dehydration to Ensure Adequate Renal Perfusion and will need to Renally Adjust Meds -Continue to Monitor and Trend Renal Function carefully and repeat CMP in the AM   Essential Hypertension -C/w IV Hydralazine 10 mg q4hprn SBP >180  -Continue to Monitor BP per Protocol -Last BP reading was 141/86  History of breast cancer status postmastectomy -In remission  Hyperbilirubinemia -Bilirubin Trend: Recent Labs  Lab 08/29/23 0342 08/30/23 0230 08/31/23 0248 09/01/23 0225 09/02/23 0219 09/03/23 0334 09/04/23 0325  BILITOT 4.3* 3.7* 4.1* 3.4* 3.0* 2.6* 2.2*  -Continue to Monitor and Trend and repeat CMP in the AM   Hypoalbuminemia -Patient's Albumin Trend: Recent Labs  Lab 08/29/23 0342 08/30/23 0230 08/31/23 0248 09/01/23 0225 09/02/23 0219 09/03/23 0334 09/04/23 0325  ALBUMIN 2.3* 2.4* 2.5* 2.5* 2.4* 2.3* 2.2*  -Continue to Monitor and Trend and repeat CMP in the AM  GERD/GI Prophylaxis -C/w Pantoprazole 40 mg po Daily   GOC Unfortunately given her advanced age, multiple comorbidities, poor and inconsistent oral intake she has very poor prognosis.  Highly recommend comfort care/hospice at this time but patient and family want to go to  SNF with Palliative.  Palliative care team is following.  Likely can be discharged once her electrolytes are replete and wants she has a bed as well as insurance authorization   DVT prophylaxis: SCDs Start: 08/24/23 1205    Code Status: Limited: Do not attempt resuscitation (DNR) -DNR-LIMITED -Do Not Intubate/DNI  Family Communication: Discussed with daughter in the hallway  Disposition Plan:  Level of care: Med-Surg Status is: Inpatient Remains inpatient appropriate because: Needs SNF and insurance authorization and bed availability given that she does not want to transition to hospice at this time   Consultants:  Medical Oncology Palliative Care Medicine  Procedures:  As delineated as above  Antimicrobials:  Anti-infectives (From admission, onward)    Start     Dose/Rate Route Frequency Ordered Stop   08/31/23 1000  fluconazole (DIFLUCAN) tablet 150 mg  Status:  Discontinued        150 mg Oral Daily 08/31/23 0717 08/31/23 0756   08/31/23 1000  fluconazole (DIFLUCAN) tablet 100 mg        100 mg Oral Daily 08/31/23 0756         Subjective: Seen and examined at bedside and was doing okay.  Felt a little weak.  No nausea or vomiting.  No lightheadedness or dizziness.  No other concerns or complaints at this time.  Objective: Vitals:   09/03/23 1203 09/03/23 1943 09/04/23 0429 09/04/23 1148  BP: (!) 153/76 (!) 140/87 127/74 (!) 141/86  Pulse: (!) 54 (!) 58 (!) 59 (!) 57  Resp: 20 17 17 15   Temp: 98.1 F (36.7 C) 98.3 F (36.8 C) 98.4 F (36.9 C) 98.2 F (36.8 C)  TempSrc: Oral Oral Oral Oral  SpO2: 99% 100% 98% 100%  Weight:      Height:        Intake/Output Summary (Last 24 hours) at 09/04/2023 1958 Last data filed at 09/04/2023 1829 Gross per 24 hour  Intake 100 ml  Output 1400 ml  Net -1300 ml   Filed Weights   08/24/23 1331  Weight: 80.8 kg   Examination: Physical Exam:  Constitutional: Thin chronically ill-appearing African-American female in no acute  distress Respiratory: Diminished to auscultation bilaterally, no wheezing, rales, rhonchi or crackles. Normal respiratory effort and patient is not tachypenic. No accessory muscle use.  Unlabored breathing Cardiovascular: RRR, no murmurs / rubs / gallops. S1 and S2 auscultated. No extremity edema. Abdomen: Soft, non-tender, non-distended. Bowel sounds positive.  GU: Deferred. Musculoskeletal: No clubbing / cyanosis of digits/nails. No joint deformity upper and lower extremities. Skin: No rashes, lesions, ulcers on limited skin evaluation. No induration; Warm and dry.  Neurologic: CN 2-12 grossly intact with no focal deficits. Romberg sign cerebellar reflexes not assessed.  Psychiatric:  She is awake and alert  Data Reviewed: I have personally reviewed following labs and imaging studies  CBC: Recent Labs  Lab 08/31/23 1445 09/01/23 0225 09/02/23 0219 09/03/23 0334 09/04/23 0325  WBC 1.0* 1.0* 1.1* 1.1* 1.1*  NEUTROABS 0.7* 0.7* 0.7* 0.8* 0.7*  HGB 10.4* 10.1* 10.0* 9.9* 9.3*  HCT 32.4* 31.4* 30.8* 30.0* 28.4*  MCV 90.0 91.0 91.1 91.2 90.2  PLT 11* 10* 7* 20* 14*   Basic Metabolic Panel: Recent Labs  Lab 08/30/23 0230 08/31/23 0248 09/01/23 0225 09/02/23 0219 09/03/23 0334 09/04/23 0325  NA 144 145 149* 153* 146* 143  K 3.5 3.1* 3.3* 3.2* 3.2* 4.3  CL 120* 119* 124* 125* 118* 114*  CO2 19* 20* 20* 21* 20* 19*  GLUCOSE 122* 115* 130* 151* 150* 135*  BUN 64* 63* 58* 52* 41* 37*  CREATININE 1.83* 1.49* 1.43* 1.48* 1.15* 1.21*  CALCIUM 7.5* 7.4* 7.2* 6.9* 6.3* 6.3*  MG 2.0  --  2.1 1.8 1.6* 1.7  PHOS  --   --   --  2.1*  --  3.1   GFR: Estimated Creatinine Clearance: 44.1 mL/min (A) (by C-G formula based on SCr of 1.21 mg/dL (H)). Liver Function Tests: Recent Labs  Lab 08/31/23 0248 09/01/23 0225 09/02/23 0219 09/03/23 0334 09/04/23 0325  AST 64* 59* 54* 49* 51*  ALT 63* 59* 52* 48* 47*  ALKPHOS 246* 250* 224* 198* 188*  BILITOT 4.1* 3.4* 3.0* 2.6* 2.2*  PROT  5.0* 4.9* 4.7* 4.7* 4.4*  ALBUMIN 2.5* 2.5* 2.4* 2.3* 2.2*   Recent Labs  Lab 08/31/23 1445 09/01/23 0225  LIPASE 34 33   No results for input(s): "AMMONIA" in the last 168 hours. Coagulation Profile: No results for input(s): "INR", "PROTIME" in the last 168 hours. Cardiac Enzymes: No results for input(s): "CKTOTAL", "CKMB", "CKMBINDEX", "TROPONINI" in the last 168 hours. BNP (last 3 results) No results for input(s): "PROBNP" in the last 8760 hours. HbA1C: No results for input(s): "HGBA1C" in the last 72 hours. CBG: Recent Labs  Lab 09/04/23 0030 09/04/23  0410 09/04/23 0733 09/04/23 1149 09/04/23 1624  GLUCAP 129* 129* 120* 99 126*   Lipid Profile: No results for input(s): "CHOL", "HDL", "LDLCALC", "TRIG", "CHOLHDL", "LDLDIRECT" in the last 72 hours. Thyroid Function Tests: No results for input(s): "TSH", "T4TOTAL", "FREET4", "T3FREE", "THYROIDAB" in the last 72 hours. Anemia Panel: No results for input(s): "VITAMINB12", "FOLATE", "FERRITIN", "TIBC", "IRON", "RETICCTPCT" in the last 72 hours. Sepsis Labs: Recent Labs  Lab 08/30/23 0230 08/30/23 1839  LATICACIDVEN 2.3* 1.8   No results found for this or any previous visit (from the past 240 hours).   Radiology Studies: No results found.  Scheduled Meds:  sodium chloride   Intravenous Once   Chlorhexidine Gluconate Cloth  6 each Topical Daily   feeding supplement  1 Container Oral TID BM   fluconazole  100 mg Oral Daily   methylPREDNISolone (SOLU-MEDROL) injection  125 mg Intravenous BID   pantoprazole  40 mg Oral Daily   polyethylene glycol  17 g Oral Daily   senna-docusate  3 tablet Oral BID   sodium bicarbonate  650 mg Oral BID   Continuous Infusions:   LOS: 10 days   Marguerita Merles, DO [facility] Available via Epic secure chat 7am-7pm After these hours, please refer to coverage provider listed on [email or web address] 09/04/2023, 7:58 PM

## 2023-09-05 ENCOUNTER — Ambulatory Visit: Payer: PPO | Admitting: Neurology

## 2023-09-05 ENCOUNTER — Other Ambulatory Visit: Payer: Self-pay | Admitting: Hematology and Oncology

## 2023-09-05 DIAGNOSIS — N179 Acute kidney failure, unspecified: Secondary | ICD-10-CM | POA: Diagnosis not present

## 2023-09-05 DIAGNOSIS — G9341 Metabolic encephalopathy: Secondary | ICD-10-CM | POA: Diagnosis not present

## 2023-09-05 DIAGNOSIS — R748 Abnormal levels of other serum enzymes: Secondary | ICD-10-CM | POA: Diagnosis not present

## 2023-09-05 DIAGNOSIS — F32A Depression, unspecified: Secondary | ICD-10-CM

## 2023-09-05 LAB — COMPREHENSIVE METABOLIC PANEL
ALT: 50 U/L — ABNORMAL HIGH (ref 0–44)
AST: 54 U/L — ABNORMAL HIGH (ref 15–41)
Albumin: 2.1 g/dL — ABNORMAL LOW (ref 3.5–5.0)
Alkaline Phosphatase: 191 U/L — ABNORMAL HIGH (ref 38–126)
Anion gap: 8 (ref 5–15)
BUN: 36 mg/dL — ABNORMAL HIGH (ref 8–23)
CO2: 19 mmol/L — ABNORMAL LOW (ref 22–32)
Calcium: 5.9 mg/dL — CL (ref 8.9–10.3)
Chloride: 110 mmol/L (ref 98–111)
Creatinine, Ser: 1.25 mg/dL — ABNORMAL HIGH (ref 0.44–1.00)
GFR, Estimated: 46 mL/min — ABNORMAL LOW (ref >60)
Glucose, Bld: 132 mg/dL — ABNORMAL HIGH (ref 70–99)
Potassium: 3.9 mmol/L (ref 3.5–5.1)
Sodium: 137 mmol/L (ref 135–145)
Total Bilirubin: 2 mg/dL — ABNORMAL HIGH (ref <1.2)
Total Protein: 4.4 g/dL — ABNORMAL LOW (ref 6.5–8.1)

## 2023-09-05 LAB — GLUCOSE, CAPILLARY
Glucose-Capillary: 119 mg/dL — ABNORMAL HIGH (ref 70–99)
Glucose-Capillary: 123 mg/dL — ABNORMAL HIGH (ref 70–99)
Glucose-Capillary: 136 mg/dL — ABNORMAL HIGH (ref 70–99)
Glucose-Capillary: 138 mg/dL — ABNORMAL HIGH (ref 70–99)
Glucose-Capillary: 143 mg/dL — ABNORMAL HIGH (ref 70–99)
Glucose-Capillary: 150 mg/dL — ABNORMAL HIGH (ref 70–99)

## 2023-09-05 LAB — CBC WITH DIFFERENTIAL/PLATELET
Abs Immature Granulocytes: 0.01 10*3/uL (ref 0.00–0.07)
Basophils Absolute: 0 10*3/uL (ref 0.0–0.1)
Basophils Relative: 1 %
Eosinophils Absolute: 0 10*3/uL (ref 0.0–0.5)
Eosinophils Relative: 0 %
HCT: 27.6 % — ABNORMAL LOW (ref 36.0–46.0)
Hemoglobin: 9.2 g/dL — ABNORMAL LOW (ref 12.0–15.0)
Immature Granulocytes: 1 %
Lymphocytes Relative: 28 %
Lymphs Abs: 0.3 10*3/uL — ABNORMAL LOW (ref 0.7–4.0)
MCH: 30.3 pg (ref 26.0–34.0)
MCHC: 33.3 g/dL (ref 30.0–36.0)
MCV: 90.8 fL (ref 80.0–100.0)
Monocytes Absolute: 0.1 10*3/uL (ref 0.1–1.0)
Monocytes Relative: 10 %
Neutro Abs: 0.5 10*3/uL — ABNORMAL LOW (ref 1.7–7.7)
Neutrophils Relative %: 60 %
Platelets: 9 10*3/uL — CL (ref 150–400)
RBC: 3.04 MIL/uL — ABNORMAL LOW (ref 3.87–5.11)
RDW: 25.2 % — ABNORMAL HIGH (ref 11.5–15.5)
WBC: 0.9 10*3/uL — CL (ref 4.0–10.5)
nRBC: 0 % (ref 0.0–0.2)

## 2023-09-05 LAB — MAGNESIUM: Magnesium: 1.9 mg/dL (ref 1.7–2.4)

## 2023-09-05 LAB — PHOSPHORUS: Phosphorus: 2.7 mg/dL (ref 2.5–4.6)

## 2023-09-05 MED ORDER — CALCIUM GLUCONATE-NACL 1-0.675 GM/50ML-% IV SOLN
1.0000 g | Freq: Once | INTRAVENOUS | Status: AC
  Administered 2023-09-05: 1000 mg via INTRAVENOUS
  Filled 2023-09-05: qty 50

## 2023-09-05 MED ORDER — FILGRASTIM 480 MCG/1.6ML IJ SOLN
480.0000 ug | Freq: Every day | INTRAMUSCULAR | Status: DC
  Administered 2023-09-06 – 2023-09-07 (×2): 480 ug via SUBCUTANEOUS
  Filled 2023-09-05 (×3): qty 1.6

## 2023-09-05 MED ORDER — PANTOPRAZOLE SODIUM 40 MG PO TBEC
40.0000 mg | DELAYED_RELEASE_TABLET | Freq: Two times a day (BID) | ORAL | Status: DC
Start: 2023-09-05 — End: 2023-09-10
  Administered 2023-09-05 – 2023-09-09 (×7): 40 mg via ORAL
  Filled 2023-09-05 (×8): qty 1

## 2023-09-05 MED ORDER — FILGRASTIM-AAFI 480 MCG/0.8ML IJ SOSY: 480.0000 ug | PREFILLED_SYRINGE | Freq: Every day | INTRAMUSCULAR | Status: DC

## 2023-09-05 MED ORDER — SODIUM CHLORIDE 0.9% IV SOLUTION: Freq: Once | INTRAVENOUS | Status: AC

## 2023-09-05 MED ORDER — SODIUM BICARBONATE 650 MG PO TABS
650.0000 mg | ORAL_TABLET | Freq: Three times a day (TID) | ORAL | Status: DC
  Administered 2023-09-05 (×3): 650 mg via ORAL
  Filled 2023-09-05 (×2): qty 1

## 2023-09-05 NOTE — Progress Notes (Signed)
PROGRESS NOTE    Cynthia Velasquez  ZOX:[id number] DOB: December 24, 1950 DOA: 08/24/2023 PCP: [name], MD   Brief Narrative:  The patient is 72 year old with history of multiple cancers including breast cancer, anal cancer, recent lymphoma, HTN, HLD, depression, GERD admitted to the hospital for lethargy, confusion and weakness.  Last chemotherapy was on 11/25 but over the past week prior to admission she became progressively weak.  Upon admission noted to have pancytopenia, hypercalcemia, encephalopathy, transaminitis.  Patient has been seen by both GI oncology who are both recommending that at this time patient should be transition to hospice but patient and family want to go to SNF.  Palliative care team is following.  Multiple electrolyte abnormality requiring fluids and repletion.  He continues to be pancytopenic and now will be started on Granix and will be transfused 2 units of platelets.  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   -Patient has been through quite a bit with several treatments for her multiple different cancers.  -This is significantly taken toll on  her body and recommendations were to transition her to hospice and provide supportive care/comfort as much as possible the patient is doing better and participating with therapy and wants to go to SNF with palliative to follow.  Palliative care team is following. -status post 1 unit platelet transfusion 12/17. Per discussion with Dr. Leonides Schanz will transfuse 2 units of Platelets today and also start Granix 480 mcg Daily until ANC >1500 -CBC Trend: Recent Labs  Lab 08/30/23 1845 08/31/23 1445 09/01/23 0225 09/02/23 0219 09/03/23 0334 09/04/23 0325 09/05/23 0341  WBC 1.0* 1.0* 1.0* 1.1* 1.1* 1.1* 0.9*  HGB 10.2* 10.4* 10.1* 10.0* 9.9* 9.3* 9.2*  HCT 32.3* 32.4* 31.4* 30.8* 30.0* 28.4* 27.6*  MCV 92.0 90.0 91.0 91.1 91.2 90.2 90.8  PLT 21* 11* 10* 7* 20* 14* 9*  -Patient to follow-up outpatient setting with medical oncology for further discussion of chemotherapy -Recommending continuing oral intake and addressing of the electrolyte abnormalities -Repeat CBC in the AM  -TOC to assist with discharge disposition and I provided the patient and the family member a list of facilities and they have to make a selection and now obtain insurance authorization  Acute Metabolic Encephalopathy, improving -Overall progression of her underlying malignancy.  MRI of the brain is negative for any pathology at this time. -Na+ has Improved  -Continue Delirium Precautions and continue to address electrolyte abnormalities  Hypercalcemia -> Hypocalcemia  Hypernatremia, improved Hypomagnesemia  Hypokalemia -Related to her Malignancy.  Has been on On Solu-Medrol to help with hypercalcemia as well as Lymphoma symptoms; discussed with Dr. Leonides Schanz who recommends continuing the high-dose Solu-Medrol until at least Monday and then discussing with Dr. Candise Che about long tapering -  Electrolyte Trend: Recent Labs  Lab 08/30/23 0230 08/31/23 0248 09/01/23 0225 09/02/23 0219 09/03/23 0334 09/04/23 0325 09/05/23 0341  NA  144 145 149* 153* 146* 143 137  K 3.5 3.1* 3.3* 3.2* 3.2* 4.3 3.9  MG 2.0  --  2.1 1.8 1.6* 1.7 1.9  CALCIUM 7.5* 7.4* 7.2* 6.9* 6.3* 6.3* 5.9*  -Replete IV Calcium Gluconate 1 gram again  -Continue to Monitor and Trend and Repeat CMP, Mag, Phos within 1 week  Transaminitis/Abnormal LFTs Acute Pancreatitis.  -Multiple ongoing issues.  Likely secondary to her malignancy.  MRCP negative.  No further workup per GI -LFT and Lipase Level Trend: Recent Labs  Lab 08/30/23 0230 08/31/23 0248 08/31/23 1445 09/01/23 0225 09/02/23 0219 09/03/23 0334 09/04/23 0325 09/05/23 0341  AST 70* 64*  --  59* 54* 49* 51* 54*  ALT 66* 63*  --  59* 52* 48* 47* 50*  LIPASE  --   --    < > 33  --   --   --   --    < > = values in this interval not displayed.  -Continue to Monitor and Trend and Repeat CMP in the AM   AKI on CKD stage IIIb -Baseline creatinine around 1.0.   -BUN/Cr Trend: Recent Labs  Lab 08/30/23 0230 08/31/23 0248 09/01/23 0225 09/02/23 0219 09/03/23 0334 09/04/23 0325 09/05/23 0341  BUN 64* 63* 58* 52* 41* 37* 36*  CREATININE 1.83* 1.49* 1.43* 1.48* 1.15* 1.21* 1.25*  -Creatinine peaked at 2.05.  Slowly has been trending up over the last several weeks -D5W to stop today  -Avoid Nephrotoxic Medications, Contrast Dyes, Hypotension and Dehydration to Ensure Adequate Renal Perfusion and will need to Renally Adjust Meds -Continue to Monitor and Trend Renal Function carefully and repeat CMP in the AM   Essential Hypertension -C/w IV Hydralazine 10 mg q4hprn SBP >180  -Continue to Monitor BP per Protocol -Last BP reading was 145/90  History of breast cancer status postmastectomy -In remission  Hyperbilirubinemia -Bilirubin Trend: Recent Labs  Lab 08/30/23 0230 08/31/23 0248 09/01/23 0225 09/02/23 0219 09/03/23 0334 09/04/23 0325 09/05/23 0341  BILITOT 3.7* 4.1* 3.4* 3.0* 2.6* 2.2* 2.0*  -Continue to Monitor and Trend and repeat CMP in the AM    Hypoalbuminemia -Patient's Albumin Trend: Recent Labs  Lab 08/30/23 0230 08/31/23 0248 09/01/23 0225 09/02/23 0219 09/03/23 0334 09/04/23 0325 09/05/23 0341  ALBUMIN 2.4* 2.5* 2.5* 2.4* 2.3* 2.2* 2.1*  -Continue to Monitor and Trend and repeat CMP in the AM  GERD/GI Prophylaxis -C/w Pantoprazole 40 mg po Daily   GOC Unfortunately given her advanced age, multiple comorbidities, poor and inconsistent oral intake she has very poor prognosis.  Highly recommend comfort care/hospice at this time but patient and family want to go to SNF with Palliative.  Palliative care team is following.  Likely can be discharged once her electrolytes are replete and wants she has a bed as well as insurance authorization   DVT prophylaxis: SCDs Start: 08/24/23 1205    Code Status: Limited: Do not attempt resuscitation (DNR) -DNR-LIMITED -Do Not Intubate/DNI  Family Communication: No family currently at bedside  Disposition Plan:  Level of care: Med-Surg Status is: Inpatient Remains inpatient appropriate because: Needs SNF bed and insurance authorization as well as clearance by the medical oncology team   Consultants:  Medical Oncology Palliative Care Medicine  Procedures:  As delineated as above  Antimicrobials:  Anti-infectives (From admission, onward)    Start     Dose/Rate Route Frequency  Ordered Stop   08/31/23 1000  fluconazole (DIFLUCAN) tablet 150 mg  Status:  Discontinued        150 mg Oral Daily 08/31/23 0717 08/31/23 0756   08/31/23 1000  fluconazole (DIFLUCAN) tablet 100 mg        100 mg Oral Daily 08/31/23 0756         Subjective: Seen and examined at bedside and was doing okay and fatigued again.  Family still has not picked SNF for her to rehab.  Had no complaints but wanted to rest.  No other concerns requested this time.  Objective: Vitals:   09/04/23 1148 09/04/23 2018 09/05/23 0601 09/05/23 1150  BP: (!) 141/86 129/75 136/71 (!) 145/90  Pulse: (!) 57 (!) 59 (!)  59 62  Resp: 15 18  16   Temp: 98.2 F (36.8 C) 98.5 F (36.9 C) 98.2 F (36.8 C) 97.8 F (36.6 C)  TempSrc: Oral Oral Oral Oral  SpO2: 100% 100% 97% 100%  Weight:      Height:        Intake/Output Summary (Last 24 hours) at 09/05/2023 1805 Last data filed at 09/05/2023 1500 Gross per 24 hour  Intake 202.23 ml  Output 1000 ml  Net -797.77 ml   Filed Weights   08/24/23 1331  Weight: 80.8 kg   Examination: Physical Exam:  Constitutional: Thin chronically ill-appearing African-American female in no acute distress appears little somnolent drowsy Respiratory: Diminished to auscultation bilaterally with some coarse breath sounds, no wheezing, rales, rhonchi or crackles. Normal respiratory effort and patient is not tachypenic. No accessory muscle use.  Unlabored breathing Cardiovascular: RRR, no murmurs / rubs / gallops. S1 and S2 auscultated. No extremity edema. Abdomen: Soft, non-tender, non-distended. Bowel sounds positive.  GU: Deferred. Musculoskeletal: No clubbing / cyanosis of digits/nails. No joint deformity upper and lower extremities. Skin: No rashes, lesions, ulcers on limited skin evaluation. No induration; Warm and dry.  Neurologic: She is little somnolent and drowsy but cranial nerves II through XII grossly intact Psychiatric: Appears calm  Data Reviewed: I have personally reviewed following labs and imaging studies  CBC: Recent Labs  Lab 09/01/23 0225 09/02/23 0219 09/03/23 0334 09/04/23 0325 09/05/23 0341  WBC 1.0* 1.1* 1.1* 1.1* 0.9*  NEUTROABS 0.7* 0.7* 0.8* 0.7* 0.5*  HGB 10.1* 10.0* 9.9* 9.3* 9.2*  HCT 31.4* 30.8* 30.0* 28.4* 27.6*  MCV 91.0 91.1 91.2 90.2 90.8  PLT 10* 7* 20* 14* 9*   Basic Metabolic Panel: Recent Labs  Lab 09/01/23 0225 09/02/23 0219 09/03/23 0334 09/04/23 0325 09/05/23 0341  NA 149* 153* 146* 143 137  K 3.3* 3.2* 3.2* 4.3 3.9  CL 124* 125* 118* 114* 110  CO2 20* 21* 20* 19* 19*  GLUCOSE 130* 151* 150* 135* 132*  BUN  58* 52* 41* 37* 36*  CREATININE 1.43* 1.48* 1.15* 1.21* 1.25*  CALCIUM 7.2* 6.9* 6.3* 6.3* 5.9*  MG 2.1 1.8 1.6* 1.7 1.9  PHOS  --  2.1*  --  3.1 2.7   GFR: Estimated Creatinine Clearance: 42.7 mL/min (A) (by C-G formula based on SCr of 1.25 mg/dL (H)). Liver Function Tests: Recent Labs  Lab 09/01/23 0225 09/02/23 0219 09/03/23 0334 09/04/23 0325 09/05/23 0341  AST 59* 54* 49* 51* 54*  ALT 59* 52* 48* 47* 50*  ALKPHOS 250* 224* 198* 188* 191*  BILITOT 3.4* 3.0* 2.6* 2.2* 2.0*  PROT 4.9* 4.7* 4.7* 4.4* 4.4*  ALBUMIN 2.5* 2.4* 2.3* 2.2* 2.1*   Recent Labs  Lab 08/31/23 1445 09/01/23 0225  LIPASE 34 33   No results for input(s): "AMMONIA" in the last 168 hours. Coagulation Profile: No results for input(s): "INR", "PROTIME" in the last 168 hours. Cardiac Enzymes: No results for input(s): "CKTOTAL", "CKMB", "CKMBINDEX", "TROPONINI" in the last 168 hours. BNP (last 3 results) No results for input(s): "PROBNP" in the last 8760 hours. HbA1C: No results for input(s): "HGBA1C" in the last 72 hours. CBG: Recent Labs  Lab 09/05/23 0003 09/05/23 0406 09/05/23 0727 09/05/23 1151 09/05/23 1551  GLUCAP 138* 143* 123* 119* 136*   Lipid Profile: No results for input(s): "CHOL", "HDL", "LDLCALC", "TRIG", "CHOLHDL", "LDLDIRECT" in the last 72 hours. Thyroid Function Tests: No results for input(s): "TSH", "T4TOTAL", "FREET4", "T3FREE", "THYROIDAB" in the last 72 hours. Anemia Panel: No results for input(s): "VITAMINB12", "FOLATE", "FERRITIN", "TIBC", "IRON", "RETICCTPCT" in the last 72 hours. Sepsis Labs: Recent Labs  Lab 08/30/23 0230 08/30/23 1839  LATICACIDVEN 2.3* 1.8   No results found for this or any previous visit (from the past 240 hours).   Radiology Studies: No results found.  Scheduled Meds:  sodium chloride   Intravenous Once   sodium chloride   Intravenous Once   Chlorhexidine Gluconate Cloth  6 each Topical Daily   feeding supplement  1 Container Oral  TID BM   filgrastim  480 mcg Subcutaneous q1800   fluconazole  100 mg Oral Daily   methylPREDNISolone (SOLU-MEDROL) injection  125 mg Intravenous BID   pantoprazole  40 mg Oral BID   polyethylene glycol  17 g Oral Daily   senna-docusate  3 tablet Oral BID   sodium bicarbonate  650 mg Oral TID   Continuous Infusions:   LOS: 11 days   Marguerita Merles, DO [facility] Available via Epic secure chat 7am-7pm After these hours, please refer to coverage provider listed on [email or web address] 09/05/2023, 6:05 PM

## 2023-09-05 NOTE — Plan of Care (Signed)

## 2023-09-05 NOTE — TOC Progression Note (Signed)
Transition of Care [facility]) - Progression Note    Patient Details  Name: Cynthia Velasquez MRN: [id number] Date of Birth: [birth year]  [facility] CM/SW Contact  Otelia Santee, LCSW Phone Number: 09/05/2023, 11:21 AM  Clinical Narrative:    Spoke with pt's daughter who is still reviewing SNF choices. She plans to tour several facilities today prior to making a choice. TOC continuing to follow for SNF choice.   Expected Discharge Plan: Home/Self Care Barriers to Discharge: Continued Medical Work up  Expected Discharge Plan and Services In-house Referral: NA Discharge Planning Services: NA Post Acute Care Choice:  (Unknown) Living arrangements for the past 2 months: Apartment                                       Social Determinants of Health (SDOH) Interventions SDOH Screenings   Food Insecurity: No Food Insecurity (08/24/2023)  Housing: Low Risk  (08/28/2023)  Recent Concern: Housing - Medium Risk (08/24/2023)  Transportation Needs: No Transportation Needs (08/24/2023)  Utilities: Not At Risk (08/24/2023)  Social Connections: Unknown (01/29/2022)   Received from [facility]  Tobacco Use: Low Risk  (08/24/2023)    Readmission Risk Interventions    09/04/2023   12:58 PM 08/25/2023    1:23 PM 08/01/2023   12:15 PM  Readmission Risk Prevention Plan  Transportation Screening Complete Complete Complete  PCP or Specialist Appt within 5-7 Days   Complete  PCP or Specialist Appt within 3-5 Days  Complete   Home Care Screening   Complete  Medication Review (RN CM)   Complete  HRI or Home Care Consult  Complete   Social Work Consult for Recovery Care Planning/Counseling  Complete   Palliative Care Screening  Complete   Medication Review Oceanographer) Complete Complete   PCP or Specialist appointment within 3-5 days of discharge Complete    HRI or Home Care Consult Complete    SW Recovery Care/Counseling Consult Complete    Palliative  Care Screening Complete    Skilled Nursing Facility Complete

## 2023-09-05 NOTE — Progress Notes (Signed)
Physical Therapy Treatment Patient Details Name: Cynthia Velasquez MRN: [id number] DOB: 1951-06-09 Today's Date: 09/05/2023   History of Present Illness Patient is a 72 year old female with a past medical history of breast cancer, anal cancer, and recent diagnosis of non-Hodgkin's lymphoma, TKA and THA, R shoulder sope,  on chemotherapy, hypertension, and GERD who was admitted on 08/24/2023 for management of decreased oral intake and increased lethargy at home.    PT Comments  Pt received in bed watching tv, denies pain, agreed to modified PT session due to low platelet level. Session consisted of B LE A/AAROM through all available planes to facilitate strength and circulation. Pt required ModA and side rail to to roll toward Left side for pillow placement and pressure offloading at buttocks area. B heels floated. All needs within reach. Will continue per POC as tolerated.    If plan is discharge home, recommend the following: Two people to help with walking and/or transfers;A lot of help with bathing/dressing/bathroom;Assist for transportation;Help with stairs or ramp for entrance;Assistance with cooking/housework   Can travel by private vehicle        Equipment Recommendations  Hospital bed;Wheelchair (measurements PT);Wheelchair cushion (measurements PT);Other (comment) (If d/c home)    Recommendations for Other Services       Precautions / Restrictions Precautions Precautions: Fall Precaution Comments: critial Platelet and WBC levels Restrictions Weight Bearing Restrictions Per Provider Order: No     Mobility  Bed Mobility Overal bed mobility: Needs Assistance Bed Mobility: Rolling Rolling: Mod assist, Max assist, Used rails         General bed mobility comments: Deferred due to low platelet level and significant weakness    Transfers                   General transfer comment: Deferred due to low platelet level    Ambulation/Gait               General  Gait Details: NT   Stairs             Wheelchair Mobility     Tilt Bed    Modified Rankin (Stroke Patients Only)       Balance                                            Cognition Arousal: Alert Behavior During Therapy: WFL for tasks assessed/performed Overall Cognitive Status: Within Functional Limits for tasks assessed                                 General Comments: Pt alert, slightly lethargic, answering questions appropriately.        Exercises General Exercises - Lower Extremity Ankle Circles/Pumps: AAROM, Both, 15 reps Heel Slides: AAROM, Both, 10 reps Hip ABduction/ADduction: AAROM, Both, 10 reps Straight Leg Raises: AAROM, Both, 5 reps, Supine Other Exercises Other Exercises: B hip IR/ER AAROM x 15 Other Exercises: Pt educated on frequent repositioning to maintain skin integrity    General Comments General comments (skin integrity, edema, etc.): Platelet level 9 this am, with high risk for bleeding, modified session.      Pertinent Vitals/Pain Pain Assessment Pain Assessment: No/denies pain    Home Living  Prior Function            PT Goals (current goals can now be found in the care plan section)      Frequency    Min 1X/week      PT Plan      Co-evaluation              AM-PAC PT "6 Clicks" Mobility   Outcome Measure  Help needed turning from your back to your side while in a flat bed without using bedrails?: Total Help needed moving from lying on your back to sitting on the side of a flat bed without using bedrails?: Total Help needed moving to and from a bed to a chair (including a wheelchair)?: Total Help needed standing up from a chair using your arms (e.g., wheelchair or bedside chair)?: Total Help needed to walk in hospital room?: Total Help needed climbing 3-5 steps with a railing? : Total 6 Click Score: 6    End of Session   Activity  Tolerance: Patient limited by fatigue Patient left: in bed;with call bell/phone within reach;with bed alarm set Nurse Communication: Mobility status;Other (comment) (Limited session) PT Visit Diagnosis: Muscle weakness (generalized) (M62.81)     Time: 4098-1191 PT Time Calculation (min) (ACUTE ONLY): 23 min  Charges:    $Therapeutic Exercise: 8-22 mins $Therapeutic Activity: 8-22 mins PT General Charges $$ ACUTE PT VISIT: 1 Visit                    Zadie Cleverly, PTA  Jannet Askew 09/05/2023, 12:46 PM

## 2023-09-06 DIAGNOSIS — N179 Acute kidney failure, unspecified: Secondary | ICD-10-CM | POA: Diagnosis not present

## 2023-09-06 DIAGNOSIS — R1084 Generalized abdominal pain: Secondary | ICD-10-CM

## 2023-09-06 DIAGNOSIS — Z515 Encounter for palliative care: Secondary | ICD-10-CM | POA: Diagnosis not present

## 2023-09-06 DIAGNOSIS — G9341 Metabolic encephalopathy: Secondary | ICD-10-CM | POA: Diagnosis not present

## 2023-09-06 DIAGNOSIS — Z79899 Other long term (current) drug therapy: Secondary | ICD-10-CM | POA: Diagnosis not present

## 2023-09-06 DIAGNOSIS — R748 Abnormal levels of other serum enzymes: Secondary | ICD-10-CM | POA: Diagnosis not present

## 2023-09-06 DIAGNOSIS — D696 Thrombocytopenia, unspecified: Secondary | ICD-10-CM | POA: Diagnosis not present

## 2023-09-06 DIAGNOSIS — C8442 Peripheral T-cell lymphoma, not classified, intrathoracic lymph nodes: Secondary | ICD-10-CM | POA: Diagnosis not present

## 2023-09-06 LAB — COMPREHENSIVE METABOLIC PANEL
ALT: 52 U/L — ABNORMAL HIGH (ref 0–44)
AST: 50 U/L — ABNORMAL HIGH (ref 15–41)
Albumin: 2.2 g/dL — ABNORMAL LOW (ref 3.5–5.0)
Alkaline Phosphatase: 180 U/L — ABNORMAL HIGH (ref 38–126)
Anion gap: 8 (ref 5–15)
BUN: 34 mg/dL — ABNORMAL HIGH (ref 8–23)
CO2: 19 mmol/L — ABNORMAL LOW (ref 22–32)
Calcium: 6 mg/dL — CL (ref 8.9–10.3)
Chloride: 109 mmol/L (ref 98–111)
Creatinine, Ser: 1.08 mg/dL — ABNORMAL HIGH (ref 0.44–1.00)
GFR, Estimated: 55 mL/min — ABNORMAL LOW (ref >60)
Glucose, Bld: 128 mg/dL — ABNORMAL HIGH (ref 70–99)
Potassium: 3.4 mmol/L — ABNORMAL LOW (ref 3.5–5.1)
Sodium: 136 mmol/L (ref 135–145)
Total Bilirubin: 2.2 mg/dL — ABNORMAL HIGH (ref <1.2)
Total Protein: 4.5 g/dL — ABNORMAL LOW (ref 6.5–8.1)

## 2023-09-06 LAB — GLUCOSE, CAPILLARY
Glucose-Capillary: 131 mg/dL — ABNORMAL HIGH (ref 70–99)
Glucose-Capillary: 137 mg/dL — ABNORMAL HIGH (ref 70–99)
Glucose-Capillary: 137 mg/dL — ABNORMAL HIGH (ref 70–99)
Glucose-Capillary: 167 mg/dL — ABNORMAL HIGH (ref 70–99)
Glucose-Capillary: 206 mg/dL — ABNORMAL HIGH (ref 70–99)
Glucose-Capillary: 255 mg/dL — ABNORMAL HIGH (ref 70–99)

## 2023-09-06 LAB — CBC WITH DIFFERENTIAL/PLATELET
Abs Immature Granulocytes: 0.01 10*3/uL (ref 0.00–0.07)
Basophils Absolute: 0 10*3/uL (ref 0.0–0.1)
Basophils Relative: 1 %
Eosinophils Absolute: 0 10*3/uL (ref 0.0–0.5)
Eosinophils Relative: 0 %
HCT: 28.4 % — ABNORMAL LOW (ref 36.0–46.0)
Hemoglobin: 9.3 g/dL — ABNORMAL LOW (ref 12.0–15.0)
Immature Granulocytes: 1 %
Lymphocytes Relative: 29 %
Lymphs Abs: 0.2 10*3/uL — ABNORMAL LOW (ref 0.7–4.0)
MCH: 29.8 pg (ref 26.0–34.0)
MCHC: 32.7 g/dL (ref 30.0–36.0)
MCV: 91 fL (ref 80.0–100.0)
Monocytes Absolute: 0.1 10*3/uL (ref 0.1–1.0)
Monocytes Relative: 12 %
Neutro Abs: 0.5 10*3/uL — ABNORMAL LOW (ref 1.7–7.7)
Neutrophils Relative %: 57 %
Platelets: 17 10*3/uL — CL (ref 150–400)
RBC: 3.12 MIL/uL — ABNORMAL LOW (ref 3.87–5.11)
RDW: 24.5 % — ABNORMAL HIGH (ref 11.5–15.5)
WBC: 0.8 10*3/uL — CL (ref 4.0–10.5)
nRBC: 0 % (ref 0.0–0.2)

## 2023-09-06 LAB — PHOSPHORUS: Phosphorus: 2.5 mg/dL (ref 2.5–4.6)

## 2023-09-06 LAB — MAGNESIUM: Magnesium: 1.8 mg/dL (ref 1.7–2.4)

## 2023-09-06 MED ORDER — POTASSIUM CHLORIDE CRYS ER 20 MEQ PO TBCR
40.0000 meq | EXTENDED_RELEASE_TABLET | Freq: Two times a day (BID) | ORAL | Status: AC
  Administered 2023-09-06 (×2): 40 meq via ORAL
  Filled 2023-09-06 (×2): qty 2

## 2023-09-06 MED ORDER — BISMUTH SUBSALICYLATE 262 MG/15ML PO SUSP
30.0000 mL | Freq: Two times a day (BID) | ORAL | Status: DC
  Administered 2023-09-06: 30 mL via ORAL
  Filled 2023-09-06: qty 236

## 2023-09-06 MED ORDER — SODIUM BICARBONATE 650 MG PO TABS
1300.0000 mg | ORAL_TABLET | Freq: Three times a day (TID) | ORAL | Status: DC
  Administered 2023-09-06 – 2023-09-09 (×8): 1300 mg via ORAL
  Filled 2023-09-06 (×10): qty 2

## 2023-09-06 MED ORDER — CALCIUM GLUCONATE-NACL 1-0.675 GM/50ML-% IV SOLN
1.0000 g | Freq: Once | INTRAVENOUS | Status: AC
  Administered 2023-09-06: 1000 mg via INTRAVENOUS
  Filled 2023-09-06: qty 50

## 2023-09-06 MED ORDER — MAGNESIUM SULFATE 2 GM/50ML IV SOLN
2.0000 g | Freq: Once | INTRAVENOUS | Status: AC
  Administered 2023-09-06: 2 g via INTRAVENOUS
  Filled 2023-09-06: qty 50

## 2023-09-06 MED ORDER — METHYLPREDNISOLONE SODIUM SUCC 125 MG IJ SOLR
125.0000 mg | Freq: Every day | INTRAMUSCULAR | Status: DC
Start: 2023-09-07 — End: 2023-09-08
  Administered 2023-09-07: 125 mg via INTRAVENOUS
  Filled 2023-09-06: qty 2

## 2023-09-06 NOTE — Progress Notes (Signed)
PROGRESS NOTE    Cynthia Velasquez  RUE:[id number] DOB: [birth year] DOA: 08/24/2023 PCP: [name], MD   Brief Narrative:  The patient is 72 year old with history of multiple cancers including breast cancer, anal cancer, recent lymphoma, HTN, HLD, depression, GERD admitted to the hospital for lethargy, confusion and weakness.  Last chemotherapy was on 11/25 but over the past week prior to admission she became progressively weak.  Upon admission noted to have pancytopenia, hypercalcemia, encephalopathy, transaminitis.  Patient has been seen by both GI oncology who are both recommending that at this time patient should be transition to hospice but patient and family want to go to SNF.  Palliative care team is following.  Multiple electrolyte abnormality requiring fluids and repletion.  He continues to be pancytopenic and now will be started on Granix and will be transfused 2 units of platelets.  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   -Patient has been through quite a bit with several treatments for her multiple different cancers.  -This is significantly taken toll on  her body and recommendations were to transition her to hospice and provide supportive care/comfort as much as possible the patient is doing better and participating with therapy and wants to go to SNF with palliative to follow.  Palliative care team is following. -status post 1 unit platelet transfusion 12/17. Per discussion with Dr. Leonides Schanz will transfuse 2 units of Platelets on 09/04/2020 and also start Granix 480 mcg Daily until ANC >1500 -CBC Trend: Recent Labs  Lab 08/31/23 1445 09/01/23 0225 09/02/23 0219 09/03/23 0334 09/04/23 0325 09/05/23 0341 09/06/23 0305  WBC 1.0* 1.0* 1.1* 1.1* 1.1* 0.9* 0.8*  HGB 10.4* 10.1* 10.0* 9.9* 9.3* 9.2* 9.3*  HCT 32.4* 31.4* 30.8* 30.0* 28.4* 27.6* 28.4*  MCV 90.0 91.0 91.1 91.2 90.2 90.8 91.0  PLT 11* 10* 7* 20* 14* 9* 17*  -Patient to follow-up outpatient setting with medical oncology for further discussion of chemotherapy -Recommending continuing oral intake and addressing of the electrolyte abnormalities -Repeat CBC in the AM  -TOC to assist with discharge disposition and I provided the patient and the family member a list of facilities and they have to make a selection and now obtain insurance authorization; in the interim we will continue her Granix and steroids are being weaned  Acute Metabolic Encephalopathy, improving -Overall progression of her underlying malignancy.  MRI of the brain is negative for any pathology at this time. -Na+ has Improved  -Continue Delirium Precautions and continue to address electrolyte abnormalities  Hypercalcemia -> Hypocalcemia  Hypernatremia, improved Hypomagnesemia  Hypokalemia -Related to her Malignancy.  Has been on On Solu-Medrol to help with hypercalcemia as well as Lymphoma symptoms; discussed with Dr. Leonides Schanz who recommends continuing the high-dose Solu-Medrol  until at least Monday and then discussing with Dr. Candise Che about long tapering however Dr. Patterson Hammersmith is now starting taper given her GI symptoms and is  now going to go up to 125 mg daily and patient also being started on Pepto-Bismol twice daily -Electrolyte Trend: Recent Labs  Lab 08/31/23 0248 09/01/23 0225 09/02/23 0219 09/03/23 0334 09/04/23 0325 09/05/23 0341 09/06/23 0305  NA 145 149* 153* 146* 143 137 136  K 3.1* 3.3* 3.2* 3.2* 4.3 3.9 3.4*  MG  --  2.1 1.8 1.6* 1.7 1.9 1.8  CALCIUM 7.4* 7.2* 6.9* 6.3* 6.3* 5.9* 6.0*  -Replete IV Calcium Gluconate 1 gram again and now will continue Potassium Supplementation with po Kcl 40 mEQ BID -Also replete with IV mag sulfate 2 mg x1 -Continue to Monitor and Trend and Repeat CMP, Mag, Phos within 1 week  Transaminitis/Abnormal LFTs, fairly stable Acute Pancreatitis, improved.  -Multiple ongoing issues.  Likely secondary to her malignancy.  MRCP negative.  No further workup per GI -LFT and Lipase Level Trend: Recent Labs  Lab 08/31/23 0248 08/31/23 1445 09/01/23 0225 09/02/23 0219 09/03/23 0334 09/04/23 0325 09/05/23 0341 09/06/23 0305  AST 64*  --  59* 54* 49* 51* 54* 50*  ALT 63*  --  59* 52* 48* 47* 50* 52*  LIPASE  --    < > 33  --   --   --   --   --    < > = values in this interval not displayed.  -Continue to Monitor and Trend and Repeat CMP in the AM   AKI on CKD stage IIIb -Baseline creatinine around 1.0.   -BUN/Cr Trend: Recent Labs  Lab 08/31/23 0248 09/01/23 0225 09/02/23 0219 09/03/23 0334 09/04/23 0325 09/05/23 0341 09/06/23 0305  BUN 63* 58* 52* 41* 37* 36* 34*  CREATININE 1.49* 1.43* 1.48* 1.15* 1.21* 1.25* 1.08*  -Creatinine peaked at 2.05.  Slowly has been trending up over the last several weeks -IVF now stopped -Avoid Nephrotoxic Medications, Contrast Dyes, Hypotension and Dehydration to Ensure Adequate Renal Perfusion and will need to Renally Adjust Meds -Continue to Monitor and Trend Renal Function carefully and repeat CMP in the AM   Essential Hypertension -C/w IV Hydralazine 10 mg q4hprn SBP >180  -Continue to Monitor BP per Protocol -Last  BP reading was 134/75  History of breast cancer status postmastectomy -In remission  Hyperbilirubinemia, improving  -Bilirubin Trend: Recent Labs  Lab 08/31/23 0248 09/01/23 0225 09/02/23 0219 09/03/23 0334 09/04/23 0325 09/05/23 0341 09/06/23 0305  BILITOT 4.1* 3.4* 3.0* 2.6* 2.2* 2.0* 2.2*  -Continue to Monitor and Trend and repeat CMP in the AM   Hypoalbuminemia -Patient's Albumin Trend: Recent Labs  Lab 08/31/23 0248 09/01/23 0225 09/02/23 0219 09/03/23 0334 09/04/23 0325 09/05/23 0341 09/06/23 0305  ALBUMIN 2.5* 2.5* 2.4* 2.3* 2.2* 2.1* 2.2*  -Continue to Monitor and Trend and repeat CMP in the AM  GERD/GI Prophylaxis -C/w Pantoprazole but increase the dose from 40 mg p.o. daily to 40 mg twice daily given her prolonged steroid use which is very high dose  GOC Unfortunately given her advanced age, multiple comorbidities, poor and inconsistent oral intake she has very poor prognosis.  Highly recommend comfort care/hospice at this time but patient and family want to go to SNF with Palliative.  Palliative care team is following.  Likely can be discharged once her electrolytes are replete and wants she has a bed as well as insurance authorization  DVT prophylaxis: SCDs Start: 08/24/23 1205  Code Status: Limited: Do not attempt resuscitation (DNR) -DNR-LIMITED -Do Not Intubate/DNI  Family Communication: No family currently at bedside but per daughter was on the phone with her was in the room and she placed her on speaker phone  Disposition Plan:  Level of care: Med-Surg Status is: Inpatient Remains inpatient appropriate because: Needs SNF and insurance authorization and bed availability.  ANC will need to be above 1500   Consultants:  Medical Oncology Palliative Care Medicine  Procedures:  As delineated as above  Antimicrobials:  Anti-infectives (From admission, onward)    Start     Dose/Rate Route Frequency Ordered Stop   08/31/23 1000  fluconazole  (DIFLUCAN) tablet 150 mg  Status:  Discontinued        150 mg Oral Daily 08/31/23 0717 08/31/23 0756   08/31/23 1000  fluconazole (DIFLUCAN) tablet 100 mg        100 mg Oral Daily 08/31/23 0756         Subjective: Seen and examined at bedside and is much more awake and states that she is feeling much better today compared to yesterday.  Still plans to go to SNF for rehabilitation.  Still has not picked up facility.  Was complaining of burning and discomfort in her abdomen to the palliative care doctor.  No other concerns or complaints at this time.  Objective: Vitals:   09/05/23 2006 09/06/23 0454 09/06/23 1022 09/06/23 1209  BP: (!) 124/96 131/76 (!) 142/79 134/75  Pulse: 64 (!) 57 (!) 57 62  Resp: 18 18 16 20   Temp: 98.4 F (36.9 C) 98.7 F (37.1 C) 98.3 F (36.8 C) 98 F (36.7 C)  TempSrc: Oral Oral  Oral  SpO2: 100% 98% 100% 100%  Weight:      Height:        Intake/Output Summary (Last 24 hours) at 09/06/2023 1428 Last data filed at 09/06/2023 1000 Gross per 24 hour  Intake 363.38 ml  Output 400 ml  Net -36.62 ml   Filed Weights   08/24/23 1331  Weight: 80.8 kg   Examination: Physical Exam:  Constitutional: Thin chronically ill-appearing African-American female no acute distress and is more awake and alert and speaking to her daughter on the phone Respiratory: Diminished to auscultation bilaterally, no wheezing, rales, rhonchi or crackles. Normal respiratory effort and patient is not tachypenic. No accessory muscle use.  Unlabored breathing Cardiovascular: RRR, no murmurs / rubs / gallops. S1 and S2 auscultated. No extremity edema.  Abdomen: Soft, non-tender, non-distended. Bowel sounds positive.  GU: Deferred. Musculoskeletal: No clubbing / cyanosis of digits/nails. No joint deformity upper and lower extremities. Skin: No rashes, lesions, ulcers on limited skin evaluation. No induration; Warm and dry.  Neurologic: CN 2-12 grossly intact with no focal deficits.  Romberg sign and cerebellar reflexes not assessed.  Psychiatric: Normal judgment and insight.  She is awake and alert and has a pleasant mood and affect  Data Reviewed: I have personally reviewed following labs and imaging studies  CBC: Recent Labs  Lab 09/02/23 0219 09/03/23 0334 09/04/23 0325 09/05/23 0341 09/06/23 0305  WBC 1.1* 1.1* 1.1* 0.9* 0.8*  NEUTROABS 0.7* 0.8* 0.7* 0.5* 0.5*  HGB 10.0* 9.9* 9.3* 9.2* 9.3*  HCT 30.8* 30.0* 28.4* 27.6* 28.4*  MCV 91.1 91.2 90.2 90.8 91.0  PLT 7* 20* 14* 9* 17*   Basic Metabolic Panel: Recent Labs  Lab 09/02/23 0219 09/03/23 0334 09/04/23 0325 09/05/23 0341 09/06/23 0305  NA 153* 146* 143 137 136  K 3.2* 3.2* 4.3  3.9 3.4*  CL 125* 118* 114* 110 109  CO2 21* 20* 19* 19* 19*  GLUCOSE 151* 150* 135* 132* 128*  BUN 52* 41* 37* 36* 34*  CREATININE 1.48* 1.15* 1.21* 1.25* 1.08*  CALCIUM 6.9* 6.3* 6.3* 5.9* 6.0*  MG 1.8 1.6* 1.7 1.9 1.8  PHOS 2.1*  --  3.1 2.7 2.5   GFR: Estimated Creatinine Clearance: 49.4 mL/min (A) (by C-G formula based on SCr of 1.08 mg/dL (H)). Liver Function Tests: Recent Labs  Lab 09/02/23 0219 09/03/23 0334 09/04/23 0325 09/05/23 0341 09/06/23 0305  AST 54* 49* 51* 54* 50*  ALT 52* 48* 47* 50* 52*  ALKPHOS 224* 198* 188* 191* 180*  BILITOT 3.0* 2.6* 2.2* 2.0* 2.2*  PROT 4.7* 4.7* 4.4* 4.4* 4.5*  ALBUMIN 2.4* 2.3* 2.2* 2.1* 2.2*   Recent Labs  Lab 08/31/23 1445 09/01/23 0225  LIPASE 34 33   No results for input(s): "AMMONIA" in the last 168 hours. Coagulation Profile: No results for input(s): "INR", "PROTIME" in the last 168 hours. Cardiac Enzymes: No results for input(s): "CKTOTAL", "CKMB", "CKMBINDEX", "TROPONINI" in the last 168 hours. BNP (last 3 results) No results for input(s): "PROBNP" in the last 8760 hours. HbA1C: No results for input(s): "HGBA1C" in the last 72 hours. CBG: Recent Labs  Lab 09/05/23 2050 09/06/23 0014 09/06/23 0452 09/06/23 0743 09/06/23 1206  GLUCAP  150* 137* 131* 137* 167*   Lipid Profile: No results for input(s): "CHOL", "HDL", "LDLCALC", "TRIG", "CHOLHDL", "LDLDIRECT" in the last 72 hours. Thyroid Function Tests: No results for input(s): "TSH", "T4TOTAL", "FREET4", "T3FREE", "THYROIDAB" in the last 72 hours. Anemia Panel: No results for input(s): "VITAMINB12", "FOLATE", "FERRITIN", "TIBC", "IRON", "RETICCTPCT" in the last 72 hours. Sepsis Labs: Recent Labs  Lab 08/30/23 1839  LATICACIDVEN 1.8   No results found for this or any previous visit (from the past 240 hours).   Radiology Studies: No results found.  Scheduled Meds:  bismuth subsalicylate  30 mL Oral BID   Chlorhexidine Gluconate Cloth  6 each Topical Daily   feeding supplement  1 Container Oral TID BM   filgrastim  480 mcg Subcutaneous q1800   fluconazole  100 mg Oral Daily   [START ON 09/07/2023] methylPREDNISolone (SOLU-MEDROL) injection  125 mg Intravenous Daily   pantoprazole  40 mg Oral BID   polyethylene glycol  17 g Oral Daily   potassium chloride  40 mEq Oral BID   senna-docusate  3 tablet Oral BID   sodium bicarbonate  1,300 mg Oral TID   Continuous Infusions:   LOS: 12 days   Marguerita Merles, DO [facility] Available via Epic secure chat 7am-7pm After these hours, please refer to coverage provider listed on [email or web address] 09/06/2023, 2:28 PM

## 2023-09-06 NOTE — TOC Progression Note (Signed)
Transition of Care [facility]) - Progression Note    Patient Details  Name: Cynthia Velasquez MRN: [id number] Date of Birth: 1951/08/03  [facility] CM/SW Contact  Darleene Cleaver, [place] Phone Number: 09/06/2023, 7:22 PM  Clinical Narrative:     TOC to follow up with patient's daughter regarding bed offers.   Expected Discharge Plan: Home/Self Care Barriers to Discharge: Continued Medical Work up  Expected Discharge Plan and Services In-house Referral: NA Discharge Planning Services: NA Post Acute Care Choice:  (Unknown) Living arrangements for the past 2 months: Apartment                                       Social Determinants of Health (SDOH) Interventions SDOH Screenings   Food Insecurity: No Food Insecurity (08/24/2023)  Housing: Low Risk  (08/28/2023)  Recent Concern: Housing - Medium Risk (08/24/2023)  Transportation Needs: No Transportation Needs (08/24/2023)  Utilities: Not At Risk (08/24/2023)  Social Connections: Unknown (01/29/2022)   [facility]  Tobacco Use: Low Risk  (08/24/2023)    Readmission Risk Interventions    09/04/2023   12:58 PM 08/25/2023    1:23 PM 08/01/2023   12:15 PM  Readmission Risk Prevention Plan  Transportation Screening Complete Complete Complete  PCP or Specialist Appt within 5-7 Days   Complete  PCP or Specialist Appt within 3-5 Days  Complete   Home Care Screening   Complete  Medication Review (RN CM)   Complete  HRI or Home Care Consult  Complete   Social Work Consult for Recovery Care Planning/Counseling  Complete   Palliative Care Screening  Complete   Medication Review [employer]) Complete Complete   PCP or Specialist appointment within 3-5 days of discharge Complete    HRI or Home Care Consult Complete    SW Recovery Care/Counseling Consult Complete    Palliative Care Screening Complete    Skilled Nursing Facility Complete

## 2023-09-06 NOTE — Plan of Care (Signed)

## 2023-09-06 NOTE — Progress Notes (Signed)
Daily Progress Note   Patient Name: Cynthia Velasquez       Date: 09/06/2023 DOB: 05-14-1951  Age: 72 y.o. MRN#: [id number] Attending Physician: Merlene Laughter, DO Primary Care Physician: [name], MD Admit Date: 08/24/2023 Length of Stay: 12 days  Reason for Consultation/Follow-up: Establishing goals of care  Subjective:   CC: Patient notes experiencing GI discomfort/burning. Following up regarding complex medical decision making.  Subjective:  Reviewed EMR prior to presenting to bedside.   Presented to bedside to meet with patient.  Patient laying comfortably in bed sleeping.  Patient easily awakened.  No family present at bedside.  Inquired how patient was feeling at this time.  Patient feels that she is making "a little" progress with PT.  She is motivated to continue working with them.  Notes she could not work with them extensively yesterday due to her low platelet count. Inquired about symptom burden at this time.  Patient denied any pain per se.  Patient did acknowledge a discomfort and burning sensation in her stomach.  Denied any signs of bleeding currently.  Discussed starting patient on Pepto-Bismol to help with digestive symptoms to which patient agreed.  Spent time providing emotional support via active listening.  All questions answered at that time.  Objective:   Vital Signs:  BP 131/76 (BP Location: Right Arm)   Pulse (!) 57   Temp 98.7 F (37.1 C) (Oral)   Resp 18   Ht 5\' 5"  (1.651 m)   Wt 80.8 kg   SpO2 98%   BMI 29.65 kg/m   Imaging: I personally reviewed recent imaging.   Assessment & Plan:   Assessment: Patient is a 72 year old female with a past medical history of breast cancer, anal cancer, and recent diagnosis of non-Hodgkin's lymphoma on chemotherapy, hypertension, and GERD who was admitted on 08/24/2023 for management of decreased oral intake and increased lethargy at home.  Patient had last received chemotherapy on 08/11/2023; under the care  of oncologist Dr. Candise Che and coordination of care with [facility] oncology.  Patient's daughter arrived that patient had initially been doing well after chemotherapy though over the past 5 days has gotten progressively weaker, not been eating or drinking well, worsening abdominal pain, and worsening weakness.  Upon presentation to the ER, lab work showing evidence of leukopenia, thrombocytopenia, worsening liver failure, jaundice, and acute renal failure.  Imaging showing continued diffuse lymphadenopathy in setting of non-Hodgkin's lymphoma.  GI consulted for evaluation; MRCP recommended and has been ordered to obtain.  Oncology has been consulted for evaluation.  Palliative medicine team consulted to assist with complex medical decision making.   Recommendations/Plan: # Complex medical decision making/goals of care:    - Patient has expressed desire to continue working with PT/OT/pursuing rehab placement. Have discussed with patient and daughter that should patient not be appropriate for rehab, pathway to go home with hospice. Daughter continues to encourage participation with PT/OT and nutritional intake.                  Code Status: Limited: Do not attempt resuscitation (DNR) -DNR-LIMITED -Do Not Intubate/DNI    # Symptom management                -Pain, acute in setting of non-Hodgkin's lymphoma                               -Continue oral oxycodone to 2.5 mg every 4 hours as  needed    -Abdominal discomfort Last BM noted 09/05/23 in EMR.    -Noted patient has been on IV Solu-Medrol 125 mg twice daily since 08/25/2023.  With concerns about potential GI bleed with low platelet count in this patient, will decrease Solu-Medrol dose to 125 mg daily.  Patient will need long wean.   -Start Pepto-Bismol twice daily  # Psycho-social/Spiritual Support:  - Support System: daughter  # Discharge Planning: To Be Determined  Discussed with: patient, RN, hospitalist   Thank you for allowing the palliative  care team to participate in the care [name].  [name], DO Palliative Care Provider PMT # [phone]  If patient remains symptomatic despite maximum doses, please call PMT at [phone] between 0700 and 1900. Outside of these hours, please call attending, as PMT does not have night coverage. *Please note that this is a verbal dictation therefore any spelling or grammatical errors are due to the "[employer]agon [employer] One" system interpretation.

## 2023-09-06 NOTE — Progress Notes (Signed)
Hematology/Oncology Progress Note  Clinical Summary: [name] is a 72 year old female with medical history significant for T-cell lymphoma who presents with generalized weakness and severe pancytopenia.  Interval History: -- Labs today show white blood cell count 0.8, hemoglobin 9.3, and platelets of 17. -- Patient reports that she feels well overall notes that she is tolerating the steroid therapy well. -- She notes she went from being constipated to having 2 loose bowel movements today. -- She reports having good appetite noting that "everything is sweet". -- She is not currently having any pain. -- Patient was started on PPI due to having stomach upset thought to be secondary to her steroids.  O:  Vitals:   09/06/23 1022 09/06/23 1209  BP: (!) 142/79 134/75  Pulse: (!) 57 62  Resp: 16 20  Temp: 98.3 F (36.8 C) 98 F (36.7 C)  SpO2: 100% 100%      Latest Ref Rng & Units 09/06/2023    3:05 AM 09/05/2023    3:41 AM 09/04/2023    3:25 AM  CMP  Glucose 70 - 99 mg/dL 284  132  440   BUN 8 - 23 mg/dL 34  36  37   Creatinine 0.44 - 1.00 mg/dL 1.02  7.25  3.66   Sodium 135 - 145 mmol/L 136  137  143   Potassium 3.5 - 5.1 mmol/L 3.4  3.9  4.3   Chloride 98 - 111 mmol/L 109  110  114   CO2 22 - 32 mmol/L 19  19  19    Calcium 8.9 - 10.3 mg/dL 6.0  5.9  6.3   Total Protein 6.5 - 8.1 g/dL 4.5  4.4  4.4   Total Bilirubin <1.2 mg/dL 2.2  2.0  2.2   Alkaline Phos 38 - 126 U/L 180  191  188   AST 15 - 41 U/L 50  54  51   ALT 0 - 44 U/L 52  50  47       Latest Ref Rng & Units 09/06/2023    3:05 AM 09/05/2023    3:41 AM 09/04/2023    3:25 AM  CBC  WBC 4.0 - 10.5 K/uL 0.8  0.9  1.1   Hemoglobin 12.0 - 15.0 g/dL 9.3  9.2  9.3   Hematocrit 36.0 - 46.0 % 28.4  27.6  28.4   Platelets 150 - 400 K/uL 17  9  14        GENERAL: well appearing elderly African-American female in NAD  SKIN: skin color, texture, turgor are normal, no rashes or significant lesions EYES:  conjunctiva are pink and non-injected, sclera clear LUNGS: clear to auscultation and percussion with normal breathing effort HEART: regular rate & rhythm and no murmurs and no lower extremity edema Musculoskeletal: no cyanosis of digits and no clubbing  PSYCH: alert & oriented x 3, fluent speech NEURO: no focal motor/sensory deficits  Assessment/Plan:  # Peripheral T Cell Lymphoma # Pancytopenia # Failure to Thrive -- Recommend transfusion threshold of hemoglobin less than 7 or platelets less than 10 -- Patient currently on stress dose steroids, has been given Pepto-Bismol and PPI in order to prevent stomach upset. -- Recommend slowly titrating down of steroid therapy.  She is currently on methylprednisolone 125 mg IV daily. -- Primary oncologist has recommended hospice care. -- Hematology service will continue to follow.   Ulysees Barns, MD Department of Hematology/Oncology [facility] Phone: [phone] Pager: [phone] Email: [email or web address]

## 2023-09-07 DIAGNOSIS — N179 Acute kidney failure, unspecified: Secondary | ICD-10-CM | POA: Diagnosis not present

## 2023-09-07 DIAGNOSIS — Z7189 Other specified counseling: Secondary | ICD-10-CM | POA: Diagnosis not present

## 2023-09-07 DIAGNOSIS — Z515 Encounter for palliative care: Secondary | ICD-10-CM | POA: Diagnosis not present

## 2023-09-07 DIAGNOSIS — C8442 Peripheral T-cell lymphoma, not classified, intrathoracic lymph nodes: Secondary | ICD-10-CM | POA: Diagnosis not present

## 2023-09-07 DIAGNOSIS — R748 Abnormal levels of other serum enzymes: Secondary | ICD-10-CM | POA: Diagnosis not present

## 2023-09-07 DIAGNOSIS — G9341 Metabolic encephalopathy: Secondary | ICD-10-CM | POA: Diagnosis not present

## 2023-09-07 DIAGNOSIS — D696 Thrombocytopenia, unspecified: Secondary | ICD-10-CM | POA: Diagnosis not present

## 2023-09-07 LAB — CBC WITH DIFFERENTIAL/PLATELET
Abs Immature Granulocytes: 0.07 10*3/uL (ref 0.00–0.07)
Basophils Absolute: 0 10*3/uL (ref 0.0–0.1)
Basophils Relative: 2 %
Eosinophils Absolute: 0 10*3/uL (ref 0.0–0.5)
Eosinophils Relative: 0 %
HCT: 30.3 % — ABNORMAL LOW (ref 36.0–46.0)
Hemoglobin: 9.9 g/dL — ABNORMAL LOW (ref 12.0–15.0)
Immature Granulocytes: 8 %
Lymphocytes Relative: 28 %
Lymphs Abs: 0.2 10*3/uL — ABNORMAL LOW (ref 0.7–4.0)
MCH: 29.7 pg (ref 26.0–34.0)
MCHC: 32.7 g/dL (ref 30.0–36.0)
MCV: 91 fL (ref 80.0–100.0)
Monocytes Absolute: 0.1 10*3/uL (ref 0.1–1.0)
Monocytes Relative: 13 %
Neutro Abs: 0.4 10*3/uL — CL (ref 1.7–7.7)
Neutrophils Relative %: 49 %
Platelets: 11 10*3/uL — CL (ref 150–400)
RBC: 3.33 MIL/uL — ABNORMAL LOW (ref 3.87–5.11)
RDW: 24.3 % — ABNORMAL HIGH (ref 11.5–15.5)
WBC: 0.9 10*3/uL — CL (ref 4.0–10.5)
nRBC: 0 % (ref 0.0–0.2)

## 2023-09-07 LAB — COMPREHENSIVE METABOLIC PANEL
ALT: 52 U/L — ABNORMAL HIGH (ref 0–44)
AST: 45 U/L — ABNORMAL HIGH (ref 15–41)
Albumin: 2.1 g/dL — ABNORMAL LOW (ref 3.5–5.0)
Alkaline Phosphatase: 171 U/L — ABNORMAL HIGH (ref 38–126)
Anion gap: 8 (ref 5–15)
BUN: 31 mg/dL — ABNORMAL HIGH (ref 8–23)
CO2: 21 mmol/L — ABNORMAL LOW (ref 22–32)
Calcium: 6.2 mg/dL — CL (ref 8.9–10.3)
Chloride: 110 mmol/L (ref 98–111)
Creatinine, Ser: 1.14 mg/dL — ABNORMAL HIGH (ref 0.44–1.00)
GFR, Estimated: 51 mL/min — ABNORMAL LOW (ref >60)
Glucose, Bld: 103 mg/dL — ABNORMAL HIGH (ref 70–99)
Potassium: 3.8 mmol/L (ref 3.5–5.1)
Sodium: 139 mmol/L (ref 135–145)
Total Bilirubin: 1.7 mg/dL — ABNORMAL HIGH (ref <1.2)
Total Protein: 4.6 g/dL — ABNORMAL LOW (ref 6.5–8.1)

## 2023-09-07 LAB — GLUCOSE, CAPILLARY
Glucose-Capillary: 100 mg/dL — ABNORMAL HIGH (ref 70–99)
Glucose-Capillary: 101 mg/dL — ABNORMAL HIGH (ref 70–99)
Glucose-Capillary: 119 mg/dL — ABNORMAL HIGH (ref 70–99)
Glucose-Capillary: 138 mg/dL — ABNORMAL HIGH (ref 70–99)
Glucose-Capillary: 154 mg/dL — ABNORMAL HIGH (ref 70–99)
Glucose-Capillary: 91 mg/dL (ref 70–99)
Glucose-Capillary: 97 mg/dL (ref 70–99)

## 2023-09-07 LAB — PHOSPHORUS: Phosphorus: 1.7 mg/dL — ABNORMAL LOW (ref 2.5–4.6)

## 2023-09-07 LAB — MAGNESIUM: Magnesium: 2.1 mg/dL (ref 1.7–2.4)

## 2023-09-07 MED ORDER — K PHOS MONO-SOD PHOS DI & MONO 155-852-130 MG PO TABS
500.0000 mg | ORAL_TABLET | Freq: Two times a day (BID) | ORAL | Status: AC
  Administered 2023-09-07: 500 mg via ORAL
  Filled 2023-09-07 (×2): qty 2

## 2023-09-07 MED ORDER — SODIUM CHLORIDE 0.9% IV SOLUTION: Freq: Once | INTRAVENOUS | Status: AC

## 2023-09-07 NOTE — Progress Notes (Signed)
PROGRESS NOTE    Cynthia Velasquez  WUJ:[id number] DOB: 05-14-1951 DOA: 08/24/2023 PCP: [name], MD   Brief Narrative:  The patient is 72 year old with history of multiple cancers including breast cancer, anal cancer, recent lymphoma, HTN, HLD, depression, GERD admitted to the hospital for lethargy, confusion and weakness.  Last chemotherapy was on 11/25 but over the past week prior to admission she became progressively weak.  Upon admission noted to have pancytopenia, hypercalcemia, encephalopathy, transaminitis.  Patient has been seen by both GI oncology who are both recommending that at this time patient should be transition to hospice but patient and family want to go to SNF.  Palliative care team is following.  Multiple electrolyte abnormality requiring fluids and repletion.  He continues to be pancytopenic and now will be started on Granix and will be transfused 2 units of platelets.  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   -Patient has been through quite a bit with several treatments for her multiple different cancers.  -This is significantly taken toll on  her body and recommendations were to transition her to hospice and provide supportive care/comfort as much as possible the patient is doing better and participating with therapy and wants to go to SNF with palliative to follow.  Palliative care team is following. -status post 1 unit platelet transfusion 12/17. Per discussion with Dr. Leonides Schanz will transfuse 2 units of Platelets on 09/04/2020 and also start Granix 480 mcg Daily until ANC >1500 -CBC Trend: Recent Labs  Lab 08/31/23 1445 09/01/23 0225 09/02/23 0219 09/03/23 0334 09/04/23 0325 09/05/23 0341 09/06/23 0305  WBC 1.0* 1.0* 1.1* 1.1* 1.1* 0.9* 0.8*  HGB 10.4* 10.1* 10.0* 9.9* 9.3* 9.2* 9.3*  HCT 32.4* 31.4* 30.8* 30.0* 28.4* 27.6* 28.4*  MCV 90.0 91.0 91.1 91.2 90.2 90.8 91.0  PLT 11* 10* 7* 20* 14* 9* 17*  -Patient to follow-up outpatient setting with medical oncology for further discussion of chemotherapy -Recommending continuing oral intake and addressing of the electrolyte abnormalities -Repeat CBC in the AM  -TOC to assist with discharge disposition and I provided the patient and the family member a list of facilities and they have to make a selection and now obtain insurance authorization; in the interim we will continue her Granix and steroids are being weaned  Acute Metabolic Encephalopathy, improving -Overall progression of her underlying malignancy.  MRI of the brain is negative for any pathology at this time. -Na+ has Improved  -Continue Delirium Precautions and continue to address electrolyte abnormalities  Hypercalcemia -> Hypocalcemia  Hypernatremia, improved Hypomagnesemia  Hypokalemia -Related to her Malignancy.  Has been on On Solu-Medrol to help with hypercalcemia as well as Lymphoma symptoms; discussed with Dr. Leonides Schanz who recommends continuing the high-dose Solu-Medrol  until at least Monday and then discussing with Dr. Candise Che about long tapering however Dr. Patterson Hammersmith is now starting taper given her GI symptoms and is  now going to go up to 125 mg daily and patient also being started on Pepto-Bismol twice daily -Electrolyte Trend: Recent Labs  Lab 08/31/23 0248 09/01/23 0225 09/02/23 0219 09/03/23 0334 09/04/23 0325 09/05/23 0341 09/06/23 0305  NA 145 149* 153* 146* 143 137 136  K 3.1* 3.3* 3.2* 3.2* 4.3 3.9 3.4*  MG  --  2.1 1.8 1.6* 1.7 1.9 1.8  CALCIUM 7.4* 7.2* 6.9* 6.3* 6.3* 5.9* 6.0*  -Replete IV Calcium Gluconate 1 gram again and now will continue Potassium Supplementation with po Kcl 40 mEQ BID -Also replete with IV mag sulfate 2 mg x1 -Continue to Monitor and Trend and Repeat CMP, Mag, Phos within 1 week  Transaminitis/Abnormal LFTs, fairly stable Acute Pancreatitis, improved.  -Multiple ongoing issues.  Likely secondary to her malignancy.  MRCP negative.  No further workup per GI -LFT and Lipase Level Trend: Recent Labs  Lab 08/31/23 0248 08/31/23 1445 09/01/23 0225 09/02/23 0219 09/03/23 0334 09/04/23 0325 09/05/23 0341 09/06/23 0305  AST 64*  --  59* 54* 49* 51* 54* 50*  ALT 63*  --  59* 52* 48* 47* 50* 52*  LIPASE  --    < > 33  --   --   --   --   --    < > = values in this interval not displayed.  -Continue to Monitor and Trend and Repeat CMP in the AM   AKI on CKD stage IIIb -Baseline creatinine around 1.0.   -BUN/Cr Trend: Recent Labs  Lab 08/31/23 0248 09/01/23 0225 09/02/23 0219 09/03/23 0334 09/04/23 0325 09/05/23 0341 09/06/23 0305  BUN 63* 58* 52* 41* 37* 36* 34*  CREATININE 1.49* 1.43* 1.48* 1.15* 1.21* 1.25* 1.08*  -Creatinine peaked at 2.05.  Slowly has been trending up over the last several weeks -IVF now stopped -Avoid Nephrotoxic Medications, Contrast Dyes, Hypotension and Dehydration to Ensure Adequate Renal Perfusion and will need to Renally Adjust Meds -Continue to Monitor and Trend Renal Function carefully and repeat CMP in the AM   Essential Hypertension -C/w IV Hydralazine 10 mg q4hprn SBP >180  -Continue to Monitor BP per Protocol -Last  BP reading was 134/75  History of breast cancer status postmastectomy -In remission  Hyperbilirubinemia, improving  -Bilirubin Trend: Recent Labs  Lab 08/31/23 0248 09/01/23 0225 09/02/23 0219 09/03/23 0334 09/04/23 0325 09/05/23 0341 09/06/23 0305  BILITOT 4.1* 3.4* 3.0* 2.6* 2.2* 2.0* 2.2*  -Continue to Monitor and Trend and repeat CMP in the AM   Hypoalbuminemia -Patient's Albumin Trend: Recent Labs  Lab 08/31/23 0248 09/01/23 0225 09/02/23 0219 09/03/23 0334 09/04/23 0325 09/05/23 0341 09/06/23 0305  ALBUMIN 2.5* 2.5* 2.4* 2.3* 2.2* 2.1* 2.2*  -Continue to Monitor and Trend and repeat CMP in the AM  GERD/GI Prophylaxis -C/w Pantoprazole but increase the dose from 40 mg p.o. daily to 40 mg twice daily given her prolonged steroid use which is very high dose  GOC Unfortunately given her advanced age, multiple comorbidities, poor and inconsistent oral intake she has very poor prognosis.  Highly recommend comfort care/hospice at this time but patient and family want to go to SNF with Palliative.  Palliative care team is following.  Likely can be discharged once her electrolytes are replete and wants she has a bed as well as insurance authorization    DVT prophylaxis: SCDs Start: 08/24/23 1205  Code Status: Limited: Do not attempt resuscitation (DNR) -DNR-LIMITED -Do Not Intubate/DNI  Family Communication: No family present at bedside  Disposition Plan:  Level of care: Med-Surg Status is: Inpatient Remains inpatient appropriate because: Needs further clinical improvement in her platelet counts and WBC status as well as clearance by specialists and informed authorization   Consultants:  Medical Oncology Palliative care medicine  Procedures:  As delineated as above  Antimicrobials:  Anti-infectives (From admission, onward)    Start     Dose/Rate Route Frequency Ordered Stop   08/31/23 1000  fluconazole (DIFLUCAN) tablet 150 mg  Status:  Discontinued         150 mg Oral Daily 08/31/23 0717 08/31/23 0756   08/31/23 1000  fluconazole (DIFLUCAN) tablet 100 mg        100 mg Oral Daily 08/31/23 0756         Subjective: Seen and examined at bedside and per the nurse she refused all her morning meds.  When I saw her she was doing well and was drinking Boba tea.  Denied any complaints and felt okay.  Having bowel movement.  No other concerns or complaints at this time.  Objective: Vitals:   09/06/23 1209 09/06/23 1916 09/07/23 0409 09/07/23 1436  BP: 134/75 136/74 117/75 101/65  Pulse: 62 (!) 58 80 98  Resp: 20 16 15 20   Temp: 98 F (36.7 C) 97.8 F (36.6 C) 99 F (37.2 C) 98.5 F (36.9 C)  TempSrc: Oral Oral  Oral  SpO2: 100% 100% 100% 97%  Weight:      Height:        Intake/Output Summary (Last 24 hours) at 09/07/2023 1836 Last data filed at 09/07/2023 1801 Gross per 24 hour  Intake 460 ml  Output 1050 ml  Net -590 ml   Filed Weights   08/24/23 1331  Weight: 80.8 kg   Examination: Physical Exam:  Constitutional: Thin and Ackley ill-appearing African-American female who is in no acute distress Respiratory: Diminished to auscultation bilaterally, no wheezing, rales, rhonchi or crackles. Normal respiratory effort and patient is not tachypenic. No accessory muscle use.  Unlabored breathing Cardiovascular: RRR, no murmurs / rubs / gallops. S1 and S2 auscultated. No extremity edema.  Abdomen: Soft, non-tender, non-distended. Bowel sounds positive.  GU: Deferred. Musculoskeletal: No clubbing / cyanosis of digits/nails. No joint deformity upper and lower extremities.  Skin: No rashes, lesions, ulcers on a limited skin evaluation. No induration; Warm and dry.  Neurologic: CN 2-12 grossly intact with no focal deficits. Romberg sign and cerebellar reflexes not assessed.  Psychiatric: Normal judgment and insight. Alert and oriented x 3. Normal mood and appropriate affect.   Data Reviewed: I have personally reviewed following labs and  imaging studies  CBC: Recent Labs  Lab 09/03/23 0334 09/04/23 0325 09/05/23 0341 09/06/23 0305 09/07/23 0306  WBC 1.1* 1.1* 0.9* 0.8* 0.9*  NEUTROABS 0.8* 0.7* 0.5* 0.5* 0.4*  HGB 9.9* 9.3* 9.2* 9.3* 9.9*  HCT 30.0* 28.4* 27.6* 28.4* 30.3*  MCV 91.2 90.2 90.8 91.0 91.0  PLT 20* 14* 9* 17* 11*   Basic Metabolic Panel: Recent Labs  Lab 09/02/23 0219 09/03/23 0334 09/04/23 0325 09/05/23 0341 09/06/23 0305 09/07/23 0306  NA 153* 146* 143 137 136 139  K 3.2* 3.2* 4.3 3.9 3.4* 3.8  CL 125* 118* 114* 110 109 110  CO2 21* 20* 19* 19* 19* 21*  GLUCOSE 151* 150* 135* 132* 128* 103*  BUN 52* 41* 37* 36* 34* 31*  CREATININE 1.48* 1.15*  1.21* 1.25* 1.08* 1.14*  CALCIUM 6.9* 6.3* 6.3* 5.9* 6.0* 6.2*  MG 1.8 1.6* 1.7 1.9 1.8 2.1  PHOS 2.1*  --  3.1 2.7 2.5 1.7*   GFR: Estimated Creatinine Clearance: 46.8 mL/min (A) (by C-G formula based on SCr of 1.14 mg/dL (H)). Liver Function Tests: Recent Labs  Lab 09/03/23 0334 09/04/23 0325 09/05/23 0341 09/06/23 0305 09/07/23 0306  AST 49* 51* 54* 50* 45*  ALT 48* 47* 50* 52* 52*  ALKPHOS 198* 188* 191* 180* 171*  BILITOT 2.6* 2.2* 2.0* 2.2* 1.7*  PROT 4.7* 4.4* 4.4* 4.5* 4.6*  ALBUMIN 2.3* 2.2* 2.1* 2.2* 2.1*   Recent Labs  Lab 09/01/23 0225  LIPASE 33   No results for input(s): "AMMONIA" in the last 168 hours. Coagulation Profile: No results for input(s): "INR", "PROTIME" in the last 168 hours. Cardiac Enzymes: No results for input(s): "CKTOTAL", "CKMB", "CKMBINDEX", "TROPONINI" in the last 168 hours. BNP (last 3 results) No results for input(s): "PROBNP" in the last 8760 hours. HbA1C: No results for input(s): "HGBA1C" in the last 72 hours. CBG: Recent Labs  Lab 09/07/23 0002 09/07/23 0411 09/07/23 0736 09/07/23 1154 09/07/23 1608  GLUCAP 119* 100* 97 91 101*   Lipid Profile: No results for input(s): "CHOL", "HDL", "LDLCALC", "TRIG", "CHOLHDL", "LDLDIRECT" in the last 72 hours. Thyroid Function Tests: No  results for input(s): "TSH", "T4TOTAL", "FREET4", "T3FREE", "THYROIDAB" in the last 72 hours. Anemia Panel: No results for input(s): "VITAMINB12", "FOLATE", "FERRITIN", "TIBC", "IRON", "RETICCTPCT" in the last 72 hours. Sepsis Labs: No results for input(s): "PROCALCITON", "LATICACIDVEN" in the last 168 hours.  No results found for this or any previous visit (from the past 240 hours).   Radiology Studies: No results found.  Scheduled Meds:  sodium chloride   Intravenous Once   bismuth subsalicylate  30 mL Oral BID   Chlorhexidine Gluconate Cloth  6 each Topical Daily   feeding supplement  1 Container Oral TID BM   filgrastim  480 mcg Subcutaneous q1800   fluconazole  100 mg Oral Daily   methylPREDNISolone (SOLU-MEDROL) injection  125 mg Intravenous Daily   pantoprazole  40 mg Oral BID   phosphorus  500 mg Oral BID   polyethylene glycol  17 g Oral Daily   senna-docusate  3 tablet Oral BID   sodium bicarbonate  1,300 mg Oral TID   Continuous Infusions:   LOS: 13 days   Marguerita Merles, DO [facility] Available via Epic secure chat 7am-7pm After these hours, please refer to coverage provider listed on [email or web address] 09/07/2023, 6:36 PM

## 2023-09-07 NOTE — Progress Notes (Signed)
Pt took her pain medication in chocolate ice cream.  She did allow me to feed her the entire cup of ice cream and seemed to enjoy it.  Pt also enjoyed a few bites of the strawberry italian ice.  She has refused all other po medications today

## 2023-09-07 NOTE — Progress Notes (Signed)
Daily Progress Note   Patient Name: Cynthia Velasquez       Date: 09/07/2023 DOB: 08-17-1951  Age: 72 y.o. MRN#: [id number] Attending Physician: Merlene Laughter, DO Primary Care Physician: [name], MD Admit Date: 08/24/2023 Length of Stay: 13 days  Reason for Consultation/Follow-up: Establishing goals of care  Subjective:   CC: Patient notes not feeling well overall this morning. Following up regarding complex medical decision making.  Subjective:  Reviewed EMR prior to presenting to bedside.  Reviewed oncology, Dr. Leonides Schanz, note from 1221. Discussed care with hospitalist and RN for medical updates.  Presented to bedside to meet with patient.  No family present at bedside.  Patient laying in bed sleeping.  Patient easily awakened.  Patient notes that she overall does not feel well this morning.  Tried to discuss her symptoms in more detail though patient noted she could not describe that she just felt bad.  Discussed adjusting oral medications for stomach discomfort though patient wants to continue with current medications at this time.  Again reminded patient she has low-dose oxycodone for pain if needed.  Patient acknowledges.  Patient continues to voice she wants to work with physical therapy to go to rehab.  All questions answered at that time.  Noted palliative medicine team will continue to follow with patient's medical journey.  Objective:   Vital Signs:  BP 117/75 (BP Location: Right Arm)   Pulse 80   Temp 99 F (37.2 C)   Resp 15   Ht 5\' 5"  (1.651 m)   Wt 80.8 kg   SpO2 100%   BMI 29.65 kg/m   Imaging: I personally reviewed recent imaging.   Assessment & Plan:   Assessment: Patient is a 72 year old female with a past medical history of breast cancer, anal cancer, and recent diagnosis of non-Hodgkin's lymphoma on chemotherapy, hypertension, and GERD who was admitted on 08/24/2023 for management of decreased oral intake and increased lethargy at home.  Patient had  last received chemotherapy on 08/11/2023; under the care of oncologist Dr. Candise Che and coordination of care with [facility] oncology.  Patient's daughter arrived that patient had initially been doing well after chemotherapy though over the past 5 days has gotten progressively weaker, not been eating or drinking well, worsening abdominal pain, and worsening weakness.  Upon presentation to the ER, lab work showing evidence of leukopenia, thrombocytopenia, worsening liver failure, jaundice, and acute renal failure.  Imaging showing continued diffuse lymphadenopathy in setting of non-Hodgkin's lymphoma.  GI consulted for evaluation; MRCP recommended and has been ordered to obtain.  Oncology has been consulted for evaluation.  Palliative medicine team consulted to assist with complex medical decision making.   Recommendations/Plan: # Complex medical decision making/goals of care:    - Patient has expressed desire to continue working with PT/OT/pursuing rehab placement. Have discussed with patient and daughter that should patient not be appropriate for rehab, pathway to go home with hospice. Daughter continues to encourage participation with PT/OT and nutritional intake.                  Code Status: Limited: Do not attempt resuscitation (DNR) -DNR-LIMITED -Do Not Intubate/DNI    # Symptom management                -Pain, acute in setting of non-Hodgkin's lymphoma                               -Continue  oral oxycodone to 2.5 mg every 4 hours as needed    -Abdominal discomfort Last BM noted 09/06/23 in EMR.    -Continue Pepto-Bismol twice daily   -IV Solu-Medrol decreased to 125 mg once daily on 09/06/2023.  Patient will need slow taper.  # Psycho-social/Spiritual Support:  - Support System: daughter  # Discharge Planning: To Be Determined  Discussed with: patient, RN, hospitalist   Thank you for allowing the palliative care team to participate in the care Cynthia Velasquez.  [name], DO Palliative  Care Provider PMT # [phone]  If patient remains symptomatic despite maximum doses, please call PMT at [phone] between 0700 and 1900. Outside of these hours, please call attending, as PMT does not have night coverage.  *Please note that this is a verbal dictation therefore any spelling or grammatical errors are due to the "Dragon Medical One" system interpretation.

## 2023-09-07 NOTE — Plan of Care (Signed)

## 2023-09-07 NOTE — Progress Notes (Addendum)
Pt refused all am po medications at this time.  Notified MD

## 2023-09-08 DIAGNOSIS — G9341 Metabolic encephalopathy: Secondary | ICD-10-CM | POA: Diagnosis not present

## 2023-09-08 DIAGNOSIS — D709 Neutropenia, unspecified: Secondary | ICD-10-CM | POA: Diagnosis not present

## 2023-09-08 DIAGNOSIS — D696 Thrombocytopenia, unspecified: Secondary | ICD-10-CM | POA: Diagnosis not present

## 2023-09-08 DIAGNOSIS — R4589 Other symptoms and signs involving emotional state: Secondary | ICD-10-CM | POA: Diagnosis not present

## 2023-09-08 DIAGNOSIS — Z7189 Other specified counseling: Secondary | ICD-10-CM | POA: Diagnosis not present

## 2023-09-08 DIAGNOSIS — C8442 Peripheral T-cell lymphoma, not classified, intrathoracic lymph nodes: Secondary | ICD-10-CM | POA: Diagnosis not present

## 2023-09-08 DIAGNOSIS — Z515 Encounter for palliative care: Secondary | ICD-10-CM | POA: Diagnosis not present

## 2023-09-08 LAB — BPAM PLATELET PHERESIS
Blood Product Expiration Date: 202412242359
Blood Product Expiration Date: 202412242359
ISSUE DATE / TIME: 202412201838
ISSUE DATE / TIME: 202412222028
Unit Type and Rh: 202412242359
Unit Type and Rh: 5100
Unit Type and Rh: 6200

## 2023-09-08 LAB — GLUCOSE, CAPILLARY
Glucose-Capillary: 163 mg/dL — ABNORMAL HIGH (ref 70–99)
Glucose-Capillary: 123 mg/dL — ABNORMAL HIGH (ref 70–99)
Glucose-Capillary: 158 mg/dL — ABNORMAL HIGH (ref 70–99)
Glucose-Capillary: 93 mg/dL (ref 70–99)
Glucose-Capillary: 108 mg/dL — ABNORMAL HIGH (ref 70–99)

## 2023-09-08 LAB — PREPARE PLATELET PHERESIS
Unit division: 0
Unit division: 0

## 2023-09-08 LAB — COMPREHENSIVE METABOLIC PANEL
ALT: 49 U/L — ABNORMAL HIGH (ref 0–44)
AST: 47 U/L — ABNORMAL HIGH (ref 15–41)
Albumin: 1.9 g/dL — ABNORMAL LOW (ref 3.5–5.0)
Alkaline Phosphatase: 170 U/L — ABNORMAL HIGH (ref 38–126)
Anion gap: 9 (ref 5–15)
BUN: 31 mg/dL — ABNORMAL HIGH (ref 8–23)
CO2: 22 mmol/L (ref 22–32)
Calcium: 5.9 mg/dL — CL (ref 8.9–10.3)
Chloride: 110 mmol/L (ref 98–111)
Creatinine, Ser: 1.12 mg/dL — ABNORMAL HIGH (ref 0.44–1.00)
GFR, Estimated: 52 mL/min — ABNORMAL LOW (ref >60)
Glucose, Bld: 171 mg/dL — ABNORMAL HIGH (ref 70–99)
Potassium: 3.7 mmol/L (ref 3.5–5.1)
Sodium: 141 mmol/L (ref 135–145)
Total Bilirubin: 2 mg/dL — ABNORMAL HIGH (ref <1.2)
Total Protein: 4.1 g/dL — ABNORMAL LOW (ref 6.5–8.1)

## 2023-09-08 LAB — CBC WITH DIFFERENTIAL/PLATELET
Abs Immature Granulocytes: 0.07 10*3/uL (ref 0.00–0.07)
Basophils Absolute: 0 10*3/uL (ref 0.0–0.1)
Basophils Relative: 2 %
Eosinophils Absolute: 0 10*3/uL (ref 0.0–0.5)
Eosinophils Relative: 0 %
HCT: 26.8 % — ABNORMAL LOW (ref 36.0–46.0)
Hemoglobin: 8.9 g/dL — ABNORMAL LOW (ref 12.0–15.0)
Immature Granulocytes: 14 %
Lymphocytes Relative: 31 %
Lymphs Abs: 0.2 10*3/uL — ABNORMAL LOW (ref 0.7–4.0)
MCH: 30.1 pg (ref 26.0–34.0)
MCHC: 33.2 g/dL (ref 30.0–36.0)
MCV: 90.5 fL (ref 80.0–100.0)
Monocytes Absolute: 0.1 10*3/uL (ref 0.1–1.0)
Monocytes Relative: 12 %
Neutro Abs: 0.2 10*3/uL — CL (ref 1.7–7.7)
Neutrophils Relative %: 41 %
Platelets: 16 10*3/uL — CL (ref 150–400)
RBC: 2.96 MIL/uL — ABNORMAL LOW (ref 3.87–5.11)
RDW: 24.7 % — ABNORMAL HIGH (ref 11.5–15.5)
WBC: 0.5 10*3/uL — CL (ref 4.0–10.5)
nRBC: 0 % (ref 0.0–0.2)

## 2023-09-08 LAB — MAGNESIUM: Magnesium: 1.8 mg/dL (ref 1.7–2.4)

## 2023-09-08 LAB — PHOSPHORUS: Phosphorus: 2.8 mg/dL (ref 2.5–4.6)

## 2023-09-08 MED ORDER — PREDNISONE 50 MG PO TABS
50.0000 mg | ORAL_TABLET | Freq: Every day | ORAL | Status: DC
  Administered 2023-09-09: 50 mg via ORAL
  Filled 2023-09-08: qty 1

## 2023-09-08 MED ORDER — CALCIUM GLUCONATE-NACL 1-0.675 GM/50ML-% IV SOLN
1.0000 g | Freq: Once | INTRAVENOUS | Status: AC
  Administered 2023-09-08: 1000 mg via INTRAVENOUS
  Filled 2023-09-08: qty 50

## 2023-09-08 MED ORDER — PREDNISONE 20 MG PO TABS: 40.0000 mg | ORAL_TABLET | Freq: Every day | ORAL | Status: DC

## 2023-09-08 MED ORDER — PREDNISONE 10 MG PO TABS: 10.0000 mg | ORAL_TABLET | Freq: Every day | ORAL | Status: DC

## 2023-09-08 MED ORDER — PREDNISONE 20 MG PO TABS: 30.0000 mg | ORAL_TABLET | Freq: Every day | ORAL | Status: DC

## 2023-09-08 MED ORDER — FENTANYL CITRATE PF 50 MCG/ML IJ SOSY
12.5000 ug | PREFILLED_SYRINGE | Freq: Once | INTRAMUSCULAR | Status: AC
  Administered 2023-09-08: 12.5 ug via INTRAVENOUS
  Filled 2023-09-08: qty 1

## 2023-09-08 MED ORDER — PREDNISONE 20 MG PO TABS: 20.0000 mg | ORAL_TABLET | Freq: Every day | ORAL | Status: DC

## 2023-09-08 MED ORDER — PREDNISONE 5 MG PO TABS: 5.0000 mg | ORAL_TABLET | Freq: Every day | ORAL | Status: DC

## 2023-09-08 NOTE — Progress Notes (Signed)
Daily Progress Note   Patient Name: Cynthia Velasquez       Date: 09/08/2023 DOB: March 24, 1951  Age: 72 y.o. MRN#: [id number] Attending Physician: Cynthia Rota, MD Primary Care Physician: Cynthia Hazel, MD Admit Date: 08/24/2023 Length of Stay: 14 days  Reason for Consultation/Follow-up: Establishing goals of care  Subjective:   CC: Discussed medical status continue to deteriorate with patient and daughter.  Following up regarding complex medical decision making.  Subjective:  Reviewed EMR prior to presenting to bedside.  Discussed care with hospitalist for medical updates.  Hospitalist had also spoken with oncology, Dr. Candise Velasquez.  Platelets today noted to be at 16.  Hemoglobin remains low despite transfusion.  Albumin noted to be 1.9.  Creatinine trending up at 1.12.  Presented to bedside to meet with patient.  Initially no family present at bedside though patient gave permission to engage in conversation.  During discussion patient's daughter, Cynthia Velasquez, did present to bedside and was able to participate in conversation.  Spent time discussing that despite aggressive medical interventions, patient's medical status continues to overall worsen.  While had been advocating for rehab since patient had voiced she wanted to participate, at this time her lab work is continuing to worsen and they are concerned that if she is sent to rehab she will likely be sent back as patient is high bleeding risk with low platelet count.  Discussed that oncology has recommended patient be hospice.  Noted if patient decides to proceed with rehab, there is a high possibility of returning to the hospital.  Patient has stated that her quality time would be at home.  With permission able to discuss that patient and daughter should determine how patient wants to spend her time moving forward since she will no longer be receiving cancer directed therapies as per oncology, she has a medical condition that we cannot reverse, and her lab  work will continue to worsen given time.  Did discuss again the possibility of home with hospice should patient and family elect for this.  Described generalities regarding hospice support and what it would and would not provide.  At this time patient appears appropriate for home hospice if support can be provided by family as is not requiring much symptom management medication.  Discussed that this could quickly change and then patient would be appropriate for inpatient hospice referral.  Patient and daughter appropriately tearful during discussion.  Patient and daughter planning to discuss to determine pathway they would like to proceed with moving forward.  All questions answered at that time.  Noted palliative medicine team continue to follow along with patient's medical journey.  Updated IDT regarding discussion with patient and daughter.  Objective:   Vital Signs:  BP 128/73 (BP Location: Left Arm)   Pulse 61   Temp 98.1 F (36.7 C) (Oral)   Resp 16   Ht 5\' 5"  (1.651 m)   Wt 80.8 kg   SpO2 100%   BMI 29.65 kg/m   Imaging: I personally reviewed recent imaging.   Assessment & Plan:   Assessment: Patient is a 72 year old female with a past medical history of breast cancer, anal cancer, and recent diagnosis of non-Hodgkin's lymphoma on chemotherapy, hypertension, and GERD who was admitted on 08/24/2023 for management of decreased oral intake and increased lethargy at home.  Patient had last received chemotherapy on 08/11/2023; under the care of oncologist Dr. Candise Velasquez and coordination of care with [facility] oncology.  Patient's daughter arrived that patient had initially been doing  well after chemotherapy though over the past 5 days has gotten progressively weaker, not been eating or drinking well, worsening abdominal pain, and worsening weakness.  Upon presentation to the ER, lab work showing evidence of leukopenia, thrombocytopenia, worsening liver failure, jaundice, and acute renal failure.   Imaging showing continued diffuse lymphadenopathy in setting of non-Hodgkin's lymphoma.  GI consulted for evaluation; MRCP recommended and has been ordered to obtain.  Oncology has been consulted for evaluation.  Palliative medicine team consulted to assist with complex medical decision making.   Recommendations/Plan: # Complex medical decision making/goals of care:    -Discussed care with patient and daughter as detailed above in HPI.  Though have been continuing aggressive medical interventions as per patient's and family's stated wishes, expressed concern that despite this, patient's overall medical status continues to worsen.  Patient continues to have low platelets and other lab work is showing abnormalities despite aggressive medical interventions.  Expressed concern that should patient go to rehab at this time, would likely be sent back to the hospital as platelets remain low despite interventions.  With permission able to discuss alternative pathway of going home with hospice.  Patient and daughter are going to discuss and determine which pathway for medical care they would like to pursue.                  Code Status: Limited: Do not attempt resuscitation (DNR) -DNR-LIMITED -Do Not Intubate/DNI    # Symptom management                -Pain, acute in setting of non-Hodgkin's lymphoma                               -Continue oral oxycodone to 2.5 mg every 4 hours as needed    -Abdominal discomfort   -Continue Pepto-Bismol twice daily   -Patient being tapered on steroids as per hospitalist  # Psycho-social/Spiritual Support:  - Support System: daughter  # Discharge Planning: To Be Determined  Discussed with: patient, RN, hospitalist, patient's daughter, [facility], oncology  Thank you for allowing the palliative care team to participate in the care Cynthia Velasquez.  Cynthia Morin, DO Palliative Care Provider PMT # [phone]  If patient remains symptomatic despite maximum doses, please call  PMT at [phone] between 0700 and 1900. Outside of these hours, please call attending, as PMT does not have night coverage.  Personally spent 46 minutes in patient care including extensive chart review (labs, imaging, progress/consult notes, vital signs), medically appropraite exam, discussed with treatment team, education to patient, family, and staff, documenting clinical information, medication review and management, coordination of care, and available advanced directive documents.   *Please note that this is a verbal dictation therefore any spelling or grammatical errors are due to the "Dragon Medical One" system interpretation.

## 2023-09-08 NOTE — Plan of Care (Signed)
  Problem: Education: Goal: Knowledge of General Education information will improve Description: Including pain rating scale, medication(s)/side effects and non-pharmacologic comfort measures Outcome: Progressing   Problem: Nutrition: Goal: Adequate nutrition will be maintained Outcome: Progressing   Problem: Coping: Goal: Level of anxiety will decrease Outcome: Progressing   

## 2023-09-08 NOTE — Progress Notes (Signed)
PROGRESS NOTE    Cynthia Velasquez  ZOX:[id number] DOB: 16-Nov-1950 DOA: 08/24/2023 PCP: [name], MD     Brief Narrative:  72 year old with history of multiple cancers including breast cancer, anal cancer, recent lymphoma, HTN, HLD, depression, GERD admitted to the hospital for lethargy, confusion and weakness.  Last chemotherapy was on 11/25 but over the past week prior to admission she became progressively weak.  Upon admission noted to have pancytopenia, hypercalcemia, encephalopathy, transaminitis.  Patient has been seen by both GI oncology who are both recommending that at this time patient should be transition to hospice.  Palliative care team is following.  Multiple electrolyte abnormality requiring fluids and repletion.  Due to persistent thrombocytopenia, now on Granix, slowly tapering prednisone.     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  Patient has been through quite a bit with several treatments for her multiple different cancers.  Hospice care was recommended.  Patient is currently wishing to pursue SNF.   Acute metabolic encephalopathy - Overall progression of her underlying malignancy.  MRI of the brain is  negative for any pathology at this time.   Hypercalcemia/hypernatremia, slightly worsening Hypokalemia - Stable for now. Taper steroids.   Pancytopenia/Neutropenia.  - Currently on Granix   Transaminitis Acute Pancreatitis.  - Multiple ongoing issues.  Likely secondary to her malignancy.  MRCP negative.  No further workup per GI   AKI on CKD stage IIIb - Baseline creatinine around 1.0.  Creatinine peaked at 2.05.     Essential hypertension -IV as needed   History of breast cancer status postmastectomy -In remission   Unfortunately given her advanced age, multiple comorbidities, poor and inconsistent oral intake she has very poor prognosis.  Highly recommend comfort care/hospice at this time.  Palliative care team is following.   PT/OT-SNF   DVT prophylaxis: SCDs Start: 08/24/23 1205 Code Status: Full Code Family Communication:   [name] at Bedside.  Status is: Inpatient Remains inpatient appropriate because:    Subjective: No complaints.  Dr Candise Che to see the ptn later today   Examination:   General exam: Appears calm and comfortable . Weak and frail Respiratory system: Clear to auscultation. Respiratory effort normal. Cardiovascular system: S1 & S2 heard, RRR. No JVD, murmurs, rubs, gallops or clicks. No pedal edema. Gastrointestinal system: Abdomen is nondistended, soft and nontender. No organomegaly or masses felt. Normal bowel sounds heard. Central nervous system: Alert and oriented. No focal neurological deficits. Extremities: Symmetric 4 x 5 power. Skin: No rashes, lesions or ulcers Psychiatry: Judgement and insight appear normal. Mood & affect appropriate.               Diet Orders (From admission, onward)     Start     Ordered   09/03/23 1736  Diet  regular Fluid consistency: Thin  Diet effective now       Question:  Fluid consistency:  Answer:  Thin   09/03/23 1736            Objective: Vitals:   09/08/23 0241 09/08/23 0244 09/08/23 0257  09/08/23 0502  BP: 109/77 109/77 113/73 128/73  Pulse: 69 69 68 61  Resp: 16 16 15 16   Temp: 97.6 F (36.4 C) 97.6 F (36.4 C) 98.4 F (36.9 C) 98.1 F (36.7 C)  TempSrc: Oral Oral  Oral  SpO2: 95% 95% 98% 100%  Weight:      Height:        Intake/Output Summary (Last 24 hours) at 09/08/2023 1336 Last data filed at 09/08/2023 1610 Gross per 24 hour  Intake 1136.5 ml  Output 950 ml  Net 186.5 ml   Filed Weights   08/24/23 1331  Weight: 80.8 kg    Scheduled Meds:  bismuth subsalicylate  30 mL Oral BID   Chlorhexidine Gluconate Cloth  6 each Topical Daily   feeding supplement  1 Container Oral TID BM   fluconazole  100 mg Oral Daily   pantoprazole  40 mg Oral BID   polyethylene glycol  17 g Oral Daily   [START ON 09/09/2023] predniSONE  50 mg Oral Q breakfast   Followed by   Melene Muller ON 09/11/2023] predniSONE  40 mg Oral Q breakfast   Followed by   Melene Muller ON 09/13/2023] predniSONE  30 mg Oral Q breakfast   Followed by   Melene Muller ON 09/15/2023] predniSONE  20 mg Oral Q breakfast   Followed by   Melene Muller ON 09/17/2023] predniSONE  10 mg Oral Q breakfast   Followed by   Melene Muller ON 09/19/2023] predniSONE  5 mg Oral Q breakfast   senna-docusate  3 tablet Oral BID   sodium bicarbonate  1,300 mg Oral TID   Continuous Infusions:  Nutritional status     Body mass index is 29.65 kg/m.  Data Reviewed:   CBC: Recent Labs  Lab 09/04/23 0325 09/05/23 0341 09/06/23 0305 09/07/23 0306 09/08/23 0223  WBC 1.1* 0.9* 0.8* 0.9* 0.5*  NEUTROABS 0.7* 0.5* 0.5* 0.4* 0.2*  HGB 9.3* 9.2* 9.3* 9.9* 8.9*  HCT 28.4* 27.6* 28.4* 30.3* 26.8*  MCV 90.2 90.8 91.0 91.0 90.5  PLT 14* 9* 17* 11* 16*   Basic Metabolic Panel: Recent Labs  Lab 09/04/23 0325 09/05/23 0341 09/06/23 0305 09/07/23 0306 09/08/23 0223  NA 143 137 136 139 141  K 4.3 3.9 3.4* 3.8 3.7  CL 114* 110 109 110 110  CO2 19* 19* 19* 21* 22  GLUCOSE 135* 132* 128* 103* 171*  BUN 37* 36* 34* 31* 31*  CREATININE 1.21*  1.25* 1.08* 1.14* 1.12*  CALCIUM 6.3* 5.9* 6.0* 6.2* 5.9*  MG 1.7 1.9 1.8 2.1 1.8  PHOS 3.1 2.7 2.5 1.7* 2.8   GFR: Estimated Creatinine Clearance: 47.7 mL/min (A) (by C-G formula based on SCr of 1.12 mg/dL (H)). Liver Function Tests: Recent Labs  Lab 09/04/23 0325 09/05/23 0341 09/06/23 0305 09/07/23 0306 09/08/23 0223  AST 51* 54* 50* 45* 47*  ALT 47* 50* 52* 52* 49*  ALKPHOS 188* 191* 180* 171* 170*  BILITOT 2.2* 2.0* 2.2* 1.7* 2.0*  PROT 4.4* 4.4* 4.5* 4.6* 4.1*  ALBUMIN 2.2* 2.1* 2.2* 2.1* 1.9*   No results for input(s): "LIPASE", "AMYLASE" in the last 168 hours. No results for input(s): "AMMONIA" in the last 168 hours. Coagulation Profile: No results for input(s): "INR", "PROTIME"  in the last 168 hours. Cardiac Enzymes: No results for input(s): "CKTOTAL", "CKMB", "CKMBINDEX", "TROPONINI" in the last 168 hours. BNP (last 3 results) No results for input(s): "PROBNP" in the last 8760 hours. HbA1C: No results for input(s): "HGBA1C" in the last 72 hours. CBG: Recent Labs  Lab 09/07/23 1951 09/07/23 2352 09/08/23 0441 09/08/23 0758 09/08/23 1142  GLUCAP 138* 154* 163* 123* 158*   Lipid Profile: No results for input(s): "CHOL", "HDL", "LDLCALC", "TRIG", "CHOLHDL", "LDLDIRECT" in the last 72 hours. Thyroid Function Tests: No results for input(s): "TSH", "T4TOTAL", "FREET4", "T3FREE", "THYROIDAB" in the last 72 hours. Anemia Panel: No results for input(s): "VITAMINB12", "FOLATE", "FERRITIN", "TIBC", "IRON", "RETICCTPCT" in the last 72 hours. Sepsis Labs: No results for input(s): "PROCALCITON", "LATICACIDVEN" in the last 168 hours.  No results found for this or any previous visit (from the past 240 hours).       Radiology Studies: No results found.         LOS: 14 days   Time spent= 35 mins    Miguel Rota, MD [facility]  If 7PM-7AM, please contact night-coverage  09/08/2023, 1:36 PM

## 2023-09-08 NOTE — Progress Notes (Signed)
Chaplain attempts routine visit. Pt and daughter are both resting and ask that chaplain return at a later time, which chaplain agrees to do.

## 2023-09-08 NOTE — Progress Notes (Signed)
Chaplain attempted visit again. Pt's daughter is awake; pt's asleep, and daughter takes pager number so she can contact chaplain for support if desired.

## 2023-09-08 NOTE — Progress Notes (Signed)
Physical Therapy Treatment Patient Details Name: Starlite Baden MRN: [id number] DOB: 1951/07/24 Today's Date: 09/08/2023   History of Present Illness \\Pt  is 72 yo female admitted on 08/24/23 with lethargy, confusion, and weakness.  Pt with pancytopenia, hypercalcemia, encephalopathy, transaminitis.  Patient has been seen by both GI and oncology who are both recommending that at this time patient should be transition to hospice but patient and family want to go to SNF.  Palliative care team is following. Pt has hx of multiple CA including breast, anal, and recent lymphoma with last chemo on 11/25.  Other hx includes but not limited to HTN, HLD, depression, and GERD.    PT Comments  Pt with slow progress and easily fatigues.  She required mod A x 2 for bed mobility and max x 2 for sit to stands into stedy.  She was able to participate with multiple OOB transfers but Pt fatigued easily -requiring rest breaks and return to bed (not able to tolerate chair).   Pt and family wish to pursue SNF at d/c.  Patient will benefit from continued inpatient follow up therapy, <3 hours/day at d/c.    If plan is discharge home, recommend the following: Two people to help with walking and/or transfers;Assist for transportation;Help with stairs or ramp for entrance;Assistance with cooking/housework;Two people to help with bathing/dressing/bathroom   Can travel by private vehicle     No  Equipment Recommendations  Hospital bed;Wheelchair (measurements PT);Wheelchair cushion (measurements PT);Other (comment);BSC/3in1 (hoyer)    Recommendations for Other Services       Precautions / Restrictions Precautions Precautions: Fall Precaution Comments: low platelets and wbcs     Mobility  Bed Mobility Overal bed mobility: Needs Assistance Bed Mobility: Rolling Rolling: Mod assist   Supine to sit: Mod assist, +2 for physical assistance Sit to supine: Max assist, +2 for physical assistance   General bed mobility  comments: Increased time, cues for sequencing, assist for trunk and legs.  At EOB , pt shifting weight to assist with scooting forward    Transfers Overall transfer level: Needs assistance Equipment used: Ambulation equipment used Transfers: Sit to/from Stand Sit to Stand: Mod assist, +2 physical assistance           General transfer comment: STS from bed to STEDY x 1, then from STEDY x 2.  Requiring mod A x2 with gait belt and bed pad under buttock.  Cues to striaghten knees and tuck buttock but still tends to lean forward onto STEDY bar.  Pt fatiguing very easily and not able to tolerate transition to chair so returned to bed Transfer via Lift Equipment: Stedy  Ambulation/Gait                   Stairs             Wheelchair Mobility     Tilt Bed    Modified Rankin (Stroke Patients Only)       Balance Overall balance assessment: Needs assistance Sitting-balance support: Feet supported, No upper extremity supported Sitting balance-Leahy Scale: Fair Sitting balance - Comments: Could static sit but with any challenges would lean posteriorly needing min A.   Standing balance support: Bilateral upper extremity supported, During functional activity, Reliant on assistive device for balance Standing balance-Leahy Scale: Poor Standing balance comment: Stood briefly x 3 (5-10 sec) with mod Ax2 to rise but then maintained with min A but leaning forward onto STEDY  Cognition Arousal: Lethargic Behavior During Therapy: WFL for tasks assessed/performed Overall Cognitive Status: Within Functional Limits for tasks assessed                                 General Comments: Pt alert, slightly lethargic, answering questions appropriately. Does need increased time to respond at times (particularly w fatigue)        Exercises General Exercises - Lower Extremity Ankle Circles/Pumps: AROM, Both, 5 reps, Seated Long Arc  Quad: AAROM, Both, 5 reps, Seated Heel Slides: AAROM, Both, 5 reps, Supine Hip ABduction/ADduction: AAROM, Both, 5 reps, Supine Other Exercises Other Exercises: Bil UE shoulder flex x 5 AAROM Other Exercises: Cues for full ROM and controlled eccentric phase    General Comments        Pertinent Vitals/Pain Pain Assessment Pain Assessment: No/denies pain    Home Living                          Prior Function            PT Goals (current goals can now be found in the care plan section) Progress towards PT goals: Progressing toward goals    Frequency    Min 1X/week      PT Plan      Co-evaluation              AM-PAC PT "6 Clicks" Mobility   Outcome Measure  Help needed turning from your back to your side while in a flat bed without using bedrails?: A Lot Help needed moving from lying on your back to sitting on the side of a flat bed without using bedrails?: Total Help needed moving to and from a bed to a chair (including a wheelchair)?: Total Help needed standing up from a chair using your arms (e.g., wheelchair or bedside chair)?: Total Help needed to walk in hospital room?: Total Help needed climbing 3-5 steps with a railing? : Total 6 Click Score: 7    End of Session Equipment Utilized During Treatment: Gait belt Activity Tolerance: Patient limited by fatigue Patient left: in bed;with call bell/phone within reach;with bed alarm set (pillows to offload) Nurse Communication: Mobility status PT Visit Diagnosis: Muscle weakness (generalized) (M62.81)     Time: 4098-1191 PT Time Calculation (min) (ACUTE ONLY): 28 min  Charges:    $Therapeutic Exercise: 8-22 mins $Therapeutic Activity: 8-22 mins PT General Charges $$ ACUTE PT VISIT: 1 Visit                     Anise Salvo, PT Acute Rehab Services [facility] [phone]    Rayetta Humphrey 09/08/2023, 11:32 AM

## 2023-09-08 NOTE — TOC Progression Note (Signed)
Transition of Care [facility]) - Progression Note    Patient Details  Name: Cynthia Velasquez MRN: [id number] Date of Birth: [birth year]  Transition of [facility]) CM/SW Contact  Darleene Cleaver, [place] Phone Number: 09/08/2023, 5:00 PM  Clinical Narrative:     10:30am CSW spoke to patient's daughter [name], regarding SNF placement, she confirmed she would like [facility].  CSW confirmed with [facility] the bed is still available pending insurance authorization.  4:00pm CSW spoke to patient's daughter, she has spoken to the attending physician and palliative, they would like to pursue home with hospice now.  CSW provided choice of hospice agency, and asked what equipment patient will need.  Per patient's daughter they have a commode, walker, and cane.  They would like a hospital bed and over the bed table.  CSW.  Patient's daughter chose [facility] for home hospice.  Referral made to [facility] from [facility].  Patient will need EMS transport back home per daughter.  CSW updated attending physician, and SNF about change of plan for home with hospice now.  [facility] to contact patient's daughter and discuss hospice at home services.   Expected Discharge Plan: Home/Self Care Barriers to Discharge: Continued Medical Work up  Expected Discharge Plan and Services In-house Referral: NA Discharge Planning Services: NA Post Acute Care Choice:  (Unknown) Living arrangements for the past 2 months: Apartment                                       Social Determinants of Health (SDOH) Interventions SDOH Screenings   Food Insecurity: No Food Insecurity (08/24/2023)  Housing: Low Risk  (08/28/2023)  Recent Concern: Housing - Medium Risk (08/24/2023)  Transportation Needs: No Transportation Needs (08/24/2023)  Utilities: Not At Risk (08/24/2023)  Social Connections: Unknown (01/29/2022)   Received from [facility], [facility]  Tobacco Use: Low Risk  (08/24/2023)    Readmission Risk  Interventions    09/04/2023   12:58 PM 08/25/2023    1:23 PM 08/01/2023   12:15 PM  Readmission Risk Prevention Plan  Transportation Screening Complete Complete Complete  PCP or Specialist Appt within 5-7 Days   Complete  PCP or Specialist Appt within 3-5 Days  Complete   Home Care Screening   Complete  Medication Review (RN CM)   Complete  HRI or Home Care Consult  Complete   Social Work Consult for Recovery Care Planning/Counseling  Complete   Palliative Care Screening  Complete   Medication Review Oceanographer) Complete Complete   PCP or Specialist appointment within 3-5 days of discharge Complete    HRI or Home Care Consult Complete    SW Recovery Care/Counseling Consult Complete    Palliative Care Screening Complete    Skilled Nursing Facility Complete

## 2023-09-08 NOTE — Plan of Care (Signed)
  Problem: Nutrition: Goal: Adequate nutrition will be maintained Outcome: Progressing   Problem: Elimination: Goal: Will not experience complications related to bowel motility Outcome: Progressing   Problem: Activity: Goal: Risk for activity intolerance will decrease Outcome: Progressing   

## 2023-09-09 ENCOUNTER — Other Ambulatory Visit (HOSPITAL_COMMUNITY): Payer: Self-pay

## 2023-09-09 LAB — PREPARE PLATELET PHERESIS
Unit division: 0
Unit division: 0

## 2023-09-09 LAB — GLUCOSE, CAPILLARY
Glucose-Capillary: 100 mg/dL — ABNORMAL HIGH (ref 70–99)
Glucose-Capillary: 96 mg/dL (ref 70–99)
Glucose-Capillary: 97 mg/dL (ref 70–99)
Glucose-Capillary: 99 mg/dL (ref 70–99)

## 2023-09-09 LAB — BPAM PLATELET PHERESIS
Blood Product Expiration Date: 202412252359
Blood Product Unit Number: 202412252359
ISSUE DATE / TIME: 202412230234
PRODUCT CODE: 202412232012
Unit Type and Rh: 202412252359
Unit Type and Rh: 202412252359
Unit Type and Rh: 202412252359
Unit Type and Rh: 6200
Unit Type and Rh: 7300

## 2023-09-09 MED ORDER — OXYCODONE HCL 5 MG PO TABS
5.0000 mg | ORAL_TABLET | ORAL | 0 refills | Status: DC | PRN
  Filled 2023-09-09: qty 30, 5d supply, fill #0

## 2023-09-09 MED ORDER — OXYCODONE HCL 5 MG PO TABS
5.0000 mg | ORAL_TABLET | ORAL | Status: DC | PRN
  Administered 2023-09-09: 5 mg via ORAL
  Filled 2023-09-09 (×2): qty 1

## 2023-09-09 MED ORDER — METHOCARBAMOL 750 MG PO TABS
750.0000 mg | ORAL_TABLET | Freq: Three times a day (TID) | ORAL | 0 refills | Status: AC | PRN
  Filled 2023-09-09: qty 20, 7d supply, fill #0

## 2023-09-09 MED ORDER — OXYCODONE HCL 5 MG PO TABS
2.5000 mg | ORAL_TABLET | ORAL | 0 refills | Status: DC | PRN
  Filled 2023-09-09: qty 30, 10d supply, fill #0

## 2023-09-09 MED ORDER — FENTANYL CITRATE PF 50 MCG/ML IJ SOSY
12.5000 ug | PREFILLED_SYRINGE | Freq: Once | INTRAMUSCULAR | Status: AC
Start: 2023-09-09 — End: 2023-09-09
  Administered 2023-09-09: 12.5 ug via INTRAVENOUS
  Filled 2023-09-09: qty 1

## 2023-09-09 MED ORDER — OXYCODONE HCL 5 MG PO TABS
2.5000 mg | ORAL_TABLET | ORAL | 0 refills | Status: AC | PRN
  Filled 2023-09-09: qty 30, 5d supply, fill #0

## 2023-09-09 MED ORDER — PREDNISONE 10 MG PO TABS
ORAL_TABLET | ORAL | 0 refills | Status: AC
  Filled 2023-09-09: qty 26, 11d supply, fill #0

## 2023-09-09 MED ORDER — FLUCONAZOLE 100 MG PO TABS
100.0000 mg | ORAL_TABLET | Freq: Every day | ORAL | 0 refills | Status: AC
  Filled 2023-09-09: qty 30, 30d supply, fill #0

## 2023-09-09 MED ORDER — MORPHINE SULFATE (PF) 2 MG/ML IV SOLN
2.0000 mg | INTRAVENOUS | Status: DC | PRN
  Administered 2023-09-09 – 2023-09-10 (×2): 2 mg via INTRAVENOUS
  Filled 2023-09-09 (×2): qty 1

## 2023-09-09 MED ORDER — FENTANYL CITRATE PF 50 MCG/ML IJ SOSY
25.0000 ug | PREFILLED_SYRINGE | Freq: Once | INTRAMUSCULAR | Status: AC
  Administered 2023-09-09: 25 ug via INTRAVENOUS
  Filled 2023-09-09: qty 1

## 2023-09-09 NOTE — Progress Notes (Signed)
[facility] Liaison Note  Received request from [facility] for hospice services at home after discharge. Spoke with patient's daughter [name] to initiate education related to hospice philosophy, services and team approach to care. Daughter verbalized understanding of information given. Per discussion, the plan is for discharge home tomorrow by EMS after DME has been delivered.  DME needs discussed. Patient has the following equipment in the home: [facility], walker Family requests the following equipment for delivery: hospital bed, over bed table  Please send signed and completed DNR home with patient/family. Please provide prescriptions at discharge as needed to ensure ongoing symptom management.  [facility] information and contact numbers given to daughter. Please call with any concerns.  Thank you for the opportunity to participate in this patient's care.   Henderson Newcomer, LPN [facility] Liaison [phone]

## 2023-09-09 NOTE — TOC Progression Note (Signed)
Transition of Care [facility]) - Progression Note    Patient Details  Name: Cynthia Velasquez MRN: [id number] Date of Birth: 22-Apr-1951  Transition of Care [facility]) CM/SW Contact  Halford Chessman Phone Number: 09/09/2023, 6:27 PM  Clinical Narrative:     CSW spoke to [facility] from [employer].  She confirmed hospital bed has been delivered and they can have a nurse out to patient's home tomorrow around 3pm.  Per patient's daughter they will need EMS transport back home.  Address confirmed on face sheet.  TOC to continue to follow patient's progress throughout discharge planning.   Expected Discharge Plan: Home/Self Care Barriers to Discharge: Continued Medical Work up  Expected Discharge Plan and Services In-house Referral: NA Discharge Planning Services: NA Post Acute Care Choice:  (Unknown) Living arrangements for the past 2 months: Apartment                                       Social Determinants of Health (SDOH) Interventions SDOH Screenings   Food Insecurity: No Food Insecurity (08/24/2023)  Housing: Low Risk  (08/28/2023)  Recent Concern: Housing - Medium Risk (08/24/2023)  Transportation Needs: No Transportation Needs (08/24/2023)  Utilities: Not At Risk (08/24/2023)  Social Connections: Unknown (01/29/2022)   Received from [facility], [facility]  Tobacco Use: Low Risk  (08/24/2023)    Readmission Risk Interventions    09/04/2023   12:58 PM 08/25/2023    1:23 PM 08/01/2023   12:15 PM  Readmission Risk Prevention Plan  Transportation Screening Complete Complete Complete  PCP or Specialist Appt within 5-7 Days   Complete  PCP or Specialist Appt within 3-5 Days  Complete   Home Care Screening   Complete  Medication Review (RN CM)   Complete  HRI or Home Care Consult  Complete   Social Work Consult for Recovery Care Planning/Counseling  Complete   Palliative Care Screening  Complete   Medication Review Oceanographer) Complete Complete   PCP or  Specialist appointment within 3-5 days of discharge Complete    HRI or Home Care Consult Complete    SW Recovery Care/Counseling Consult Complete    Palliative Care Screening Complete    Skilled Nursing Facility Complete

## 2023-09-09 NOTE — Progress Notes (Signed)
SLP Cancellation Note  Patient Details Name: Cynthia Velasquez MRN: [id number] DOB: 04-14-1951   Cancelled treatment:       Reason Eval/Treat Not Completed: Other (comment). POC has changed to home with hospice as per Palliative MD note from today (09/09/23). SLP to s/o at this time.  Angela Nevin, MA, CCC-SLP Speech Therapy

## 2023-09-09 NOTE — Progress Notes (Signed)
Oncology Short Note  Follow-up with patient and her daughter at bedside for continued goals of care discussion for her progressive peripheral T-cell lymphoma. Patient is resting comfortably. She has had progressive pancytopenia due to her lymphoma and significant liver involvement.  Patient rallied a little bit on steroids and clearance of her hypercalcemia with bisphosphonates.  However there does not seem to be a clear path forward with additional standard of care chemoimmunotherapy and she has been evaluated at [facility] and did not have any obvious clinical trial options. At this time best supportive cares through hospice remains the best approach for her. The daughter clearly understands this and is moving towards home hospice. No clear indication for G-CSF at this time. Any other intervention such as IV fluids or transfusion support would be time-limited and only for comfort to meet patient's goals to meet her family members and transition comfortably to home hospice. Patient's daughter's questions were answered in detail and supportive counseling was provided. No other new Oncologic interventions recommended.  The total time spent in the appointment was 25 minutes*.  All of the patient's questions were answered with apparent satisfaction. The patient knows to call the clinic with any problems, questions or concerns.   Wyvonnia Lora MD MS AAHIVMS [facility] Hematology/Oncology Physician [facility]  .*Total Encounter Time as defined by the Centers for Medicare and Medicaid Services includes, in addition to the face-to-face time of a patient visit (documented in the note above) non-face-to-face time: obtaining and reviewing outside history, ordering and reviewing medications, tests or procedures, care coordination (communications with other health care professionals or caregivers) and documentation in the medical record.

## 2023-09-09 NOTE — Progress Notes (Addendum)
Daily Progress Note   Patient Name: Cynthia Velasquez       Date: 09/09/2023 DOB: 1951/07/17  Age: 72 y.o. MRN#: [id number] Attending Physician: Miguel Rota, MD Primary Care Physician: [name], MD Admit Date: 08/24/2023 Length of Stay: 15 days  Reason for Consultation/Follow-up: Establishing goals of care  Subjective:   CC:  Following up regarding complex medical decision making.  Subjective:  Reviewed EMR prior to presenting to bedside.  Discussed care with [facility] regarding discharge plans.   Objective:   Vital Signs:  BP (!) 81/62 (BP Location: Left Arm)   Pulse 98   Temp 98 F (36.7 C) (Oral)   Resp 16   Ht 5\' 5"  (1.651 m)   Wt 80.8 kg   SpO2 94%   BMI 29.65 kg/m   Imaging: I personally reviewed recent imaging.   Assessment & Plan:   Assessment: Patient is a 73 year old female with a past medical history of breast cancer, anal cancer, and recent diagnosis of non-Hodgkin's lymphoma on chemotherapy, hypertension, and GERD who was admitted on 08/24/2023 for management of decreased oral intake and increased lethargy at home.  Patient had last received chemotherapy on 08/11/2023; under the care of oncologist Dr. Candise Che and coordination of care with [facility].  Patient's daughter arrived that patient had initially been doing well after chemotherapy though over the past 5 days has gotten progressively weaker, not been eating or drinking well, worsening abdominal pain, and worsening weakness.  Upon presentation to the ER, lab work showing evidence of leukopenia, thrombocytopenia, worsening liver failure, jaundice, and acute renal failure.  Imaging showing continued diffuse lymphadenopathy in setting of non-Hodgkin's lymphoma.  GI consulted for evaluation; MRCP recommended and has been ordered to obtain.  Oncology has been consulted for evaluation.  Palliative medicine team consulted to assist with complex medical decision making.   Recommendations/Plan: # Complex medical  decision making/goals of care:    -Discussed care with patient and daughter as detailed above in HPI.  Though have been continuing aggressive medical interventions as per patient's and family's stated wishes, expressed concern that despite this, patient's overall medical status continues to worsen.  Patient continues to have low platelets and other lab work is showing abnormalities despite aggressive medical interventions.  Expressed concern that should patient go to rehab at this time, would likely be sent back to the hospital as platelets remain low despite interventions.  With permission able to discuss alternative pathway of going home with hospice.  Patient and daughter are going to  pursue home with hospice.                   Code Status: Limited: Do not attempt resuscitation (DNR) -DNR-LIMITED -Do Not Intubate/DNI    # Symptom management                -Pain, acute in setting of non-Hodgkin's lymphoma                               -Continue oral oxycodone to 2.5 mg every 4 hours as needed    -Abdominal discomfort   -Continue Pepto-Bismol twice daily   -Patient being tapered on steroids as per hospitalist  # Psycho-social/Spiritual Support:  - Support System: daughter  # Discharge Planning: To Be Determined  Discussed with:  TOC  Thank you for allowing the palliative care team to participate in the care [name].  Low MDM [name] MD.  Palliative Care Provider PMT # [phone]  If patient remains symptomatic despite maximum doses, please call PMT at [phone] between 0700 and 1900. Outside of these hours, please call attending, as PMT does not have night coverage.

## 2023-09-09 NOTE — Plan of Care (Signed)
  Problem: Education: Goal: Knowledge of General Education information will improve Description: Including pain rating scale, medication(s)/side effects and non-pharmacologic comfort measures Outcome: Progressing   Problem: Clinical Measurements: Goal: Ability to maintain clinical measurements within normal limits will improve Outcome: Progressing Goal: Will remain free from infection Outcome: Progressing Goal: Diagnostic test results will improve Outcome: Progressing Goal: Respiratory complications will improve Outcome: Progressing Goal: Cardiovascular complication will be avoided Outcome: Progressing   Problem: Activity: Goal: Risk for activity intolerance will decrease Outcome: Progressing   Problem: Elimination: Goal: Will not experience complications related to bowel motility Outcome: Progressing Goal: Will not experience complications related to urinary retention Outcome: Progressing   Problem: Safety: Goal: Ability to remain free from injury will improve Outcome: Progressing   Problem: Skin Integrity: Goal: Risk for impaired skin integrity will decrease Outcome: Progressing   Problem: Health Behavior/Discharge Planning: Goal: Ability to manage health-related needs will improve Outcome: Not Progressing   Problem: Nutrition: Goal: Adequate nutrition will be maintained Outcome: Not Progressing   Problem: Coping: Goal: Level of anxiety will decrease Outcome: Not Progressing   Problem: Pain Management: Goal: General experience of comfort will improve Outcome: Not Progressing

## 2023-09-10 DIAGNOSIS — G9341 Metabolic encephalopathy: Secondary | ICD-10-CM | POA: Diagnosis not present

## 2023-09-10 NOTE — Progress Notes (Signed)
Ptar here to transport patient.

## 2023-09-10 NOTE — Plan of Care (Signed)

## 2023-09-10 NOTE — Discharge Summary (Signed)
Physician Discharge Summary  Cynthia Velasquez WJX:[id number] DOB: [birth year] DOA: 08/24/2023  PCP: [name], MD  Admit date: 08/24/2023 Discharge date: 09/10/2023  Admitted From: Home Disposition: Home with hospice  Recommendations for Outpatient Follow-up:  Transition patient home with hospice today   Discharge Condition: Stable CODE STATUS: DNR Diet recommendation: Regular as tolerated  Brief/Interim Summary:  Brief Narrative:  72 year old with history of multiple cancers including breast cancer, anal cancer, recent lymphoma, HTN, HLD, depression, GERD admitted to the hospital for lethargy, confusion and weakness.  Last chemotherapy was on 11/25 but over the past week prior to admission she became progressively weak.  Upon admission noted to have pancytopenia, hypercalcemia, encephalopathy, transaminitis.  Patient has been seen by both GI oncology who are both recommending that at this time patient should be transition to hospice.  Palliative care team is following.  Multiple electrolyte abnormality requiring fluids and repletion.  Due to persistent thrombocytopenia, now on Granix, slowly tapering prednisone.   After extensive discussions with patient and family members, patient, oncology and palliative care team patient and family were agreeable to transition patient to hospice at home.  Currently awaiting arrangements at home.  Hopefully we can discharge patient later today.   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  Patient has been through quite a bit with several treatments for her multiple different cancers.  Recommend hospice at home at this time.  Plans to transition patient home later today.   Acute metabolic encephalopathy, resolved - Overall progression of her underlying malignancy.  MRI of the brain is negative for any pathology at this time.   Hypercalcemia/hypernatremia, slightly worsening Hypokalemia - Stable for now. Taper steroids.   Pancytopenia/Neutropenia.  - Currently on Granix   Transaminitis Acute Pancreatitis.  - Multiple ongoing issues.  Likely secondary to her malignancy.  MRCP negative.  No further workup per GI   AKI on CKD stage IIIb - Baseline creatinine around 1.0.  Creatinine peaked at 2.05.     Essential hypertension -IV as needed   History of breast cancer status postmastectomy -In remission   Unfortunately given her advanced age, multiple comorbidities, poor and inconsistent oral intake she has very poor prognosis.  Highly recommend comfort care/hospice at this time.  Palliative care team is following.   PT/OT-SNF   DVT prophylaxis: SCDs Start: 08/24/23 1205 Code Status: Full Code Family Communication: Daughter is present at bedside Status is: Inpatient Remains inpatient appropriate because: Home with hospice later today    Subjective: Seen and examined at bedside no complaints.  Wishing to go home with hospice today   Examination:   General exam: Appears calm and comfortable . Weak and frail Respiratory system: Clear to auscultation. Respiratory effort normal. Cardiovascular system: S1 & S2 heard, RRR. No JVD, murmurs,  rubs, gallops or  clicks. No pedal edema. Gastrointestinal system: Abdomen is nondistended, soft and nontender. No organomegaly or masses felt. Normal bowel sounds heard. Central nervous system: Alert and oriented. No focal neurological deficits. Extremities: Symmetric 4 x 5 power. Skin: No rashes, lesions or ulcers Psychiatry: Judgement and insight appear normal. Mood & affect appropriate.   Discharge Diagnoses:  Principal Problem:   Acute metabolic encephalopathy Active Problems:   Hypercalcemia   AKI (acute kidney injury) (HCC)   Elevated liver enzymes   Neutropenia (HCC)   Generalized weakness   Counseling regarding advance care planning and goals of care   Essential hypertension   Depression   Palliative care encounter   Hyperbilirubinemia   High risk medication use   Cancer associated pain   Medication management   Need for emotional support   Peripheral T cell lymphoma of intrathoracic lymph nodes (HCC)   Thrombocytopenia (HCC)   Goals of care, counseling/discussion       Discharge Exam: Vitals:   09/09/23 2003 09/10/23 0327  BP: 136/73 108/64  Pulse: 69 65  Resp: 18 17  Temp: (!) 97.5 F (36.4 C) 98 F (36.7 C)  SpO2: 97% 99%   Vitals:   09/09/23 1424 09/09/23 1641 09/09/23 2003 09/10/23 0327  BP: (!) 85/61 126/82 136/73 108/64  Pulse: 89 85 69 65  Resp:  15 18 17   Temp:  97.8 F (36.6 C) (!) 97.5 F (36.4 C) 98 F (36.7 C)  TempSrc:  Oral Oral Oral  SpO2:  97% 97% 99%  Weight:      Height:          Discharge Instructions   Allergies as of 09/10/2023       Reactions   Tramadol Itching, Rash   Belinostat Nausea And Vomiting   Headache and  nausea during infusion. See progress note from 02/24/23   Codeine Itching, Rash   Vicodin [hydrocodone-acetaminophen] Itching, Rash        Medication List     STOP taking these medications    dexamethasone 4 MG tablet Commonly known as: DECADRON   potassium chloride SA 20 MEQ tablet Commonly known as:  KLOR-CON M       TAKE these medications    acyclovir 400 MG tablet Commonly known as: ZOVIRAX Take 1 tablet (400 mg total) by mouth daily.   amLODipine 5 MG tablet Commonly known as: NORVASC Take 1 tablet (5 mg total) by mouth daily.   fluconazole 100 MG tablet Commonly known as: DIFLUCAN Take 1 tablet (100 mg total) by mouth daily.   lidocaine-prilocaine cream Commonly known as: EMLA Apply to affected area once What changed:  how much to take how to take this when to take this   losartan 100 MG tablet Commonly known as: COZAAR Take 100 mg by mouth daily.   magic mouthwash (multi-ingredient) oral suspension Take 5 mLs by mouth 3 (three) times daily as needed.   Magnesium 200 MG Tabs Take 2 tablets (400 mg total) by mouth 2 (two) times daily.   methocarbamol 750 MG tablet Commonly known as: ROBAXIN Take 1 tablet (750 mg total) by mouth every 8 (eight) hours as needed for muscle spasms.   ondansetron 8 MG tablet Commonly known as: Zofran Take 1 tablet (8 mg total) by mouth every 8 (eight) hours as needed for nausea or vomiting. Start on the third day after chemotherapy.   oxyCODONE 5 MG immediate release tablet Commonly known as: Oxy IR/ROXICODONE Take 0.5-1 tablets (2.5-5 mg  total) by mouth every 4 (four) hours as needed for moderate pain (pain score 4-6) or severe pain (pain score 7-10).   pantoprazole 40 MG tablet Commonly known as: PROTONIX Take 1 tablet (40 mg total) by mouth 2 (two) times daily.   polyethylene glycol 17 g packet Commonly known as: MIRALAX / GLYCOLAX Take 17 g by mouth daily.   predniSONE 10 MG tablet Commonly known as: DELTASONE Take 5 tablets (50 mg total) by mouth daily with breakfast for 1 day, THEN 4 tablets (40 mg total) daily with breakfast for 2 days, THEN 3 tablets (30 mg total) daily with breakfast for 2 days, THEN 2 tablets (20 mg total) daily with breakfast for 2 days, THEN 1 tablet (10 mg total) daily with breakfast for 2  days, THEN 0.5 tablets (5 mg total) daily with breakfast for 2 days. Start taking on: September 09, 2023   prochlorperazine 10 MG tablet Commonly known as: COMPAZINE Take 1 tablet (10 mg total) by mouth every 6 (six) hours as needed for nausea or vomiting.   senna-docusate 8.6-50 MG tablet Commonly known as: Senokot-S Take 1 tablet by mouth 2 (two) times daily.   sucralfate 1 g tablet Commonly known as: CARAFATE Dissolve 1 tablet in 5-10ml of water twice a day and then swallow slurry.   triamcinolone ointment 0.5 % Commonly known as: KENALOG Apply 1 Application topically 2 (two) times daily. To rash under both armpits What changed:  when to take this reasons to take this               Durable Medical Equipment  (From admission, onward)           Start     Ordered   09/09/23 0741  For home use only DME Bedside commode  Once       Question:  Patient needs a bedside commode to treat with the following condition  Answer:  Ambulatory dysfunction   09/09/23 0740   09/09/23 0741  For home use only DME Walker rolling  Once       Question Answer Comment  Walker: With 5 Inch Wheels   Patient needs a walker to treat with the following condition Ambulatory dysfunction      09/09/23 0740   09/09/23 0741  For home use only DME Cane  Once        09/09/23 0740   09/09/23 0741  For home use only DME Overbed table  Once        09/09/23 0740   09/09/23 0741  For home use only DME Hospital bed  Once       Question Answer Comment  Length of Need Lifetime   Bed type Semi-electric      09/09/23 0740            Follow-up Information     [name], MD Follow up in 1 week(s).   Specialty: Family Medicine Contact information: [address] [place] [address] [phone]                Allergies  Allergen Reactions   Tramadol Itching and Rash   Belinostat Nausea And Vomiting    Headache and  nausea during infusion. See progress note from 02/24/23    Codeine Itching and Rash   Vicodin [Hydrocodone-Acetaminophen] Itching and Rash    You were cared for by a hospitalist during your hospital stay. If you have any questions about your discharge medications or the care you received while  you were in the hospital after you are discharged, you can call the unit and asked to speak with the hospitalist on call if the hospitalist that took care of you is not available. Once you are discharged, your primary care physician will handle any further medical issues. Please note that no refills for any discharge medications will be authorized once you are discharged, as it is imperative that you return to your primary care physician (or establish a relationship with a primary care physician if you do not have one) for your aftercare needs so that they can reassess your need for medications and monitor your lab values.  You were cared for by a hospitalist during your hospital stay. If you have any questions about your discharge medications or the care you received while you were in the hospital after you are discharged, you can call the unit and asked to speak with the hospitalist on call if the hospitalist that took care of you is not available. Once you are discharged, your primary care physician will handle any further medical issues. Please note that NO REFILLS for any discharge medications will be authorized once you are discharged, as it is imperative that you return to your primary care physician (or establish a relationship with a primary care physician if you do not have one) for your aftercare needs so that they can reassess your need for medications and monitor your lab values.  Please request your Prim.MD to go over all Hospital Tests and Procedure/Radiological results at the follow up, please get all Hospital records sent to your Prim MD by signing hospital release before you go home.  Get CBC, CMP, 2 view Chest X ray checked  by Primary MD during your next  visit or SNF MD in 5-7 days ( we routinely change or add medications that can affect your baseline labs and fluid status, therefore we recommend that you get the mentioned basic workup next visit with your PCP, your PCP may decide not to get them or add new tests based on their clinical decision)  On your next visit with your primary care physician please Get Medicines reviewed and adjusted.  If you experience worsening of your admission symptoms, develop shortness of breath, life threatening emergency, suicidal or homicidal thoughts you must seek medical attention immediately by calling 911 or calling your MD immediately  if symptoms less severe.  You Must read complete instructions/literature along with all the possible adverse reactions/side effects for all the Medicines you take and that have been prescribed to you. Take any new Medicines after you have completely understood and accpet all the possible adverse reactions/side effects.   Do not drive, operate heavy machinery, perform activities at heights, swimming or participation in water activities or provide baby sitting services if your were admitted for syncope or siezures until you have seen by Primary MD or a Neurologist and advised to do so again.  Do not drive when taking Pain medications.   Procedures/Studies: CT ABDOMEN PELVIS WO CONTRAST Result Date: 08/30/2023 CLINICAL DATA:  Abdominal pain, acute, nonlocalized. History of T-cell lymphoma with disease progression noted on the most recent PET-CT last month. EXAM: CT ABDOMEN AND PELVIS WITHOUT CONTRAST TECHNIQUE: Multidetector CT imaging of the abdomen and pelvis was performed following the standard protocol without IV contrast. RADIATION DOSE REDUCTION: This exam was performed according to the departmental dose-optimization program which includes automated exposure control, adjustment of the mA and/or kV according to patient size and/or use of iterative reconstruction technique.  COMPARISON:  MRI/MRCP 08/24/2023, CT abdomen pelvis with IV contrast 08/24/2023, PET-CT 08/07/2023 and CT abdomen and pelvis with IV contrast 07/31/2023. FINDINGS: Lower chest: Again noted minimal right and trace left layering pleural effusions, stable. There is patchy ground-glass disease with underlying interstitial thickening in the anterior base of the right upper lobe and in the medial lingula, new since December 8, most likely due to bilateral pneumonia. There is subsegmental atelectasis above a mildly elevated left posterior hemidiaphragm. Remaining visible lung bases are clear. There is mild cardiomegaly. Port catheter terminates about the superior cavoatrial junction. No pericardial effusion. The cardiac blood pool is less dense than the myocardium consistent with anemia. Hepatobiliary: No focal liver abnormality is seen. Status post cholecystectomy. No biliary dilatation. Pancreas: Faint increased peripancreatic edema. Correlate with serum lipase for potential acute interstitial pancreatitis. No peripancreatic fluid collection, mass or ductal dilatation. Spleen: Unremarkable without contrast. No splenomegaly. Mild increased perisplenic ascites. Adrenals/Urinary Tract: No adrenal mass. Small Bosniak 1 cyst superior pole right kidney. Hounsfield density of -3. The unenhanced kidneys are otherwise unremarkable. There is no urinary stone or obstruction. The ureters are normal caliber. Both ureters extend over a conglomerate retroperitoneal multilevel lymph node mantle. The bladder is posteriorly obscured by spray artifact from bilateral hip replacements. The anterior bladder is unremarkable. Stomach/Bowel: No dilatation or overt wall thickening. An appendix is not seen in this patient. There is moderate retained stool in the ascending and transverse colon. Uncomplicated sigmoid diverticulosis. Vascular/Lymphatic: Aortic atherosclerosis. No AAA. Branch vessel atherosclerosis also. No appreciable interval change  without contrast in previously described multifocal adenopathy, short axis measurements of index nodes as follows: Right retrocrural again measuring 1.2 cm; Porta-hepatis lymph node 1.1 cm; Large retroperitoneal lymph node conglomerate mass with aortocaval and proximal iliac vascular encasement, slightly above the aortic bifurcation is 7.5 x 3.3 cm; Left mid abdominal mesenteric lymph node 1 cm (2:46); right inguinal chain 1.1 cm on 2:86. There is extrinsic compression of the infrarenal IVC and common iliac veins, better demonstrated with contrast. Reproductive: Status post hysterectomy. No adnexal masses. Other: Mild amount of abdominal and pelvic ascites, increased. Small supraumbilical anterior wall fat hernias are unchanged. There is no incarcerated hernia. There is no free hemorrhage, free air, or localizing collection. Musculoskeletal: Reverse S shaped thoracolumbar scoliosis with osteopenia and advanced degenerative changes of the lumbar spine, prior bilateral hip replacements. Osteopenia. Multilevel acquired lumbar spinal stenosis greatest at L3-4. No destructive bone lesion is seen. There is increased body wall anasarca compared to the last CT, particularly in the flanks and outer reaches of the abdominal wall. External uterine collection device in place. IMPRESSION: 1. New patchy ground-glass disease with underlying interstitial thickening in the anterior base of the right upper lobe and in the medial lingula, most likely due to bilateral pneumonia. 2. Stable minimal right and trace left pleural effusions. 3. Cardiomegaly with anemia. 4. Faint increased peripancreatic edema. Correlate with serum lipase for potential acute interstitial pancreatitis. 5. Increased mild abdominal and pelvic ascites. 6. Increased body wall anasarca. 7. Constipation and diverticulosis. 8. Aortic and branch vessel atherosclerosis. 9. Multifocal adenopathy consistent with history of T-cell lymphoma, not significantly changed  without contrast since 1 week ago. 10. Osteopenia, scoliosis and degenerative change. Multilevel acquired lumbar spinal stenosis greatest at L3-4. Aortic Atherosclerosis (ICD10-I70.0). Electronically Signed   By: Almira Bar M.D.   On: 08/30/2023 20:51   DG Abd Portable 1V Result Date: 08/29/2023 CLINICAL DATA:  Abdominal pain.  History of lymphoma. EXAM: PORTABLE ABDOMEN - 1  VIEW COMPARISON:  Abdominopelvic CT 08/24/2023. FINDINGS: 1019 hours. There is a normal nonobstructive bowel gas pattern. No supine evidence of free intraperitoneal air or suspicious abdominal calcifications. Cholecystectomy clips, moderate convex right lumbar scoliosis and previous bilateral total hip arthroplasty noted. IMPRESSION: No radiographic evidence of bowel obstruction or other acute abdominal process. Electronically Signed   By: Carey Bullocks M.D.   On: 08/29/2023 14:21   MR BRAIN WO CONTRAST Result Date: 08/28/2023 CLINICAL DATA:  Initial evaluation for altered mental status, confusion, unknown cause. EXAM: MRI HEAD WITHOUT CONTRAST TECHNIQUE: Multiplanar, multiecho pulse sequences of the brain and surrounding structures were obtained without intravenous contrast. COMPARISON:  Prior head CT from 08/24/2023 as well as PET-CT from 08/07/2023 FINDINGS: Brain: Examination moderately degraded by motion artifact. Generalized age-related cerebral atrophy. No significant cerebral white matter disease for age. No abnormal foci of restricted diffusion to suggest acute or subacute ischemia. Gray-white matter dish in maintained. No areas of chronic cortical infarction. No acute intracranial hemorrhage. Single small focus of chronic hemosiderin staining noted at the subcortical left parietal region (series 10, image 40), suggesting prior hemorrhage at this location. No other chronic intracranial blood products. No mass lesion, midline shift, or mass effect. No hydrocephalus or extra-axial fluid collection. Pituitary gland and  suprasellar region within normal limits. Vascular: Major intracranial vascular flow voids are maintained. Skull and upper cervical spine: Craniocervical junction within normal limits. Diffusely heterogeneous and mottled appearance of the bone marrow throughout the calvarium, best appreciated on DWI sequence (series 5, image 40 for example). Finding is nonspecific, and could be related to history of T-cell lymphoma. Possible osseous metastatic disease or multiple myeloma could also have this appearance. No scalp soft tissue abnormality. Sinuses/Orbits: Globes and orbital soft tissues within normal limits. Paranasal sinuses are clear. No mastoid effusion. Other: None. IMPRESSION: 1. No acute intracranial abnormality. 2. Diffusely heterogeneous and mottled appearance of the bone marrow throughout the calvarium. Finding is nonspecific, and favored to be related to history of T-cell lymphoma. Possible osseous metastatic disease or multiple myeloma could also have this appearance. Correlation with laboratory values and possible bone scan suggested for further evaluation as warranted. 3. Otherwise unremarkable brain MRI for age. Electronically Signed   By: Rise Mu M.D.   On: 08/28/2023 21:28   MR 3D Recon At Scanner Result Date: 08/25/2023 CLINICAL DATA:  Jaundice, T-cell lymphoma EXAM: MRI ABDOMEN WITHOUT CONTRAST  (INCLUDING MRCP) TECHNIQUE: Multiplanar multisequence MR imaging of the abdomen was performed. Heavily T2-weighted images of the biliary and pancreatic ducts were obtained, and three-dimensional MRCP images were rendered by post processing. COMPARISON:  CT abdomen/pelvis dated 08/24/2023. PET-CT dated 08/07/2023. FINDINGS: Motion degraded images. Lower chest: Trace bilateral pleural effusions. Hepatobiliary: Liver is grossly unremarkable. No focal hepatic lesion is seen. Status post cholecystectomy. No intrahepatic or extrahepatic dilatation. Pancreas:  Within normal limits. Spleen:  Within  normal limits. Adrenals/Urinary Tract:  Adrenal glands are within normal limits. Bilateral renal cysts, measuring up to 16 mm in the right upper kidney (series 4/image 27), benign (Bosniak I). No follow-up is recommended. No hydronephrosis. Stomach/Bowel: Stomach is within normal limits. Visualized bowel is unremarkable. Vascular/Lymphatic:  No evidence of abdominal aortic aneurysm. Upper abdominal and retroperitoneal lymphadenopathy, including a dominant 2.4 cm short axis left periaortic nodal mass (series 4/image 41). Other:  Trace perihepatic and perisplenic ascites. Musculoskeletal: No focal osseous lesions.  Body wall edema. IMPRESSION: Motion degraded images. Status post cholecystectomy. No intrahepatic or extrahepatic biliary dilatation. Upper abdominal and retroperitoneal lymphadenopathy, corresponding to  the patient's known lymphoma, better evaluated on recent CT and PET. Trace bilateral pleural effusions. Trace upper abdominal ascites. Body wall edema. Electronically Signed   By: Charline Bills M.D.   On: 08/25/2023 01:15   MR ABDOMEN MRCP WO CONTRAST Result Date: 08/25/2023 CLINICAL DATA:  Jaundice, T-cell lymphoma EXAM: MRI ABDOMEN WITHOUT CONTRAST  (INCLUDING MRCP) TECHNIQUE: Multiplanar multisequence MR imaging of the abdomen was performed. Heavily T2-weighted images of the biliary and pancreatic ducts were obtained, and three-dimensional MRCP images were rendered by post processing. COMPARISON:  CT abdomen/pelvis dated 08/24/2023. PET-CT dated 08/07/2023. FINDINGS: Motion degraded images. Lower chest: Trace bilateral pleural effusions. Hepatobiliary: Liver is grossly unremarkable. No focal hepatic lesion is seen. Status post cholecystectomy. No intrahepatic or extrahepatic dilatation. Pancreas:  Within normal limits. Spleen:  Within normal limits. Adrenals/Urinary Tract:  Adrenal glands are within normal limits. Bilateral renal cysts, measuring up to 16 mm in the right upper kidney (series  4/image 27), benign (Bosniak I). No follow-up is recommended. No hydronephrosis. Stomach/Bowel: Stomach is within normal limits. Visualized bowel is unremarkable. Vascular/Lymphatic:  No evidence of abdominal aortic aneurysm. Upper abdominal and retroperitoneal lymphadenopathy, including a dominant 2.4 cm short axis left periaortic nodal mass (series 4/image 41). Other:  Trace perihepatic and perisplenic ascites. Musculoskeletal: No focal osseous lesions.  Body wall edema. IMPRESSION: Motion degraded images. Status post cholecystectomy. No intrahepatic or extrahepatic biliary dilatation. Upper abdominal and retroperitoneal lymphadenopathy, corresponding to the patient's known lymphoma, better evaluated on recent CT and PET. Trace bilateral pleural effusions. Trace upper abdominal ascites. Body wall edema. Electronically Signed   By: Charline Bills M.D.   On: 08/25/2023 01:15   CT Angio Chest PE W and/or Wo Contrast Result Date: 08/24/2023 CLINICAL DATA:  Increasing weakness since Thursday, history of lymphoma EXAM: CT ANGIOGRAPHY CHEST CT ABDOMEN AND PELVIS WITH CONTRAST TECHNIQUE: Multidetector CT imaging of the chest was performed using the standard protocol during bolus administration of intravenous contrast. Multiplanar CT image reconstructions and MIPs were obtained to evaluate the vascular anatomy. Multidetector CT imaging of the abdomen and pelvis was performed using the standard protocol during bolus administration of intravenous contrast. RADIATION DOSE REDUCTION: This exam was performed according to the departmental dose-optimization program which includes automated exposure control, adjustment of the mA and/or kV according to patient size and/or use of iterative reconstruction technique. CONTRAST:  80mL OMNIPAQUE IOHEXOL 350 MG/ML SOLN COMPARISON:  08/07/2023, 07/31/2023, 05/09/2023 FINDINGS: CTA CHEST FINDINGS Cardiovascular: This is a technically adequate evaluation of the pulmonary vasculature.  No filling defects or pulmonary emboli. The heart is unremarkable without pericardial effusion. Ectasia of the thoracic aorta without evidence of aneurysm or dissection. Atherosclerosis of the aortic arch. Mediastinum/Nodes: Extensive lymphadenopathy is identified, similar in appearance to recent PET scan. Index lymph nodes are as follows: Right cervical chain, 2.9 cm, image 4/4. Left axilla, 1.2 cm short axis, image 50/4 AP window, 1.3 cm short axis, image 54/4. Left hilum, 1.6 cm short axis, image 42/4. Left internal mammary chain, 1.2 cm short axis, image 55/4. The lymphadenopathy has progressed since the 12/28/2022 chest CT, but is similar in appearance to recent PET scan 08/07/2023. Thyroid, trachea, and esophagus are unremarkable. Lungs/Pleura: There are 2 new left upper lobe pulmonary nodules. On image 20/12 there is an 8 mm nodule, which is metabolically active on recent PET scan. On image 55/12 there is a 7 mm nodule, with faint low level activity on recent PET scan. Findings are suspicious for metastatic disease. Hypoventilatory changes are seen at the  lung bases. Curvilinear areas of subpleural consolidation at the lung apices may reflect atelectasis. No effusion or pneumothorax. Musculoskeletal: 9 mm subcutaneous nodule in the posterior left shoulder image 43/4 corresponds to a metabolically active lesion on PET scan, and is new since prior CT. No acute or destructive bony abnormalities. Severe degenerative changes of the left shoulder. Unremarkable right shoulder arthroplasty. Reconstructed images demonstrate no additional findings. Review of the MIP images confirms the above findings. CT ABDOMEN and PELVIS FINDINGS Hepatobiliary: No focal liver abnormality is seen. Status post cholecystectomy. No biliary dilatation. Pancreas: Unremarkable. No pancreatic ductal dilatation or surrounding inflammatory changes. Spleen: Normal in size without focal abnormality. Adrenals/Urinary Tract: Stable appearance of  the adrenals and kidneys. No urinary tract calculi or obstruction. Bladder is unremarkable. Stomach/Bowel: No bowel obstruction or ileus. Scattered colonic diverticulosis without diverticulitis. No bowel wall thickening or inflammatory change. Vascular/Lymphatic: Persistent lymphadenopathy throughout the abdomen and pelvis. Index lymph nodes are as follows: Right retrocrural, image 24/2, 12 mm.  Previously 16 mm. Porta hepatis, image 33/2, 13 mm.  No change. Retroperitoneal lymph node mass, image 50/2, 78 x 30 mm, previously 89 x 31 mm. Central mesenteric, image 59/2, 9 mm.  Previously 14 mm. Right inguinal, image 90/2, 11 mm.  Previously 15 mm. Stable aortic atherosclerosis. There is extrinsic compression upon the IVC and bilateral common iliac veins due to the retroperitoneal lymph node mass, stable since prior study. Reproductive: Status post hysterectomy. No adnexal masses. Other: Trace free fluid within the paracolic gutters. No free intraperitoneal gas. Small fat containing supraumbilical ventral hernias are stable. Musculoskeletal: No acute or destructive bony abnormalities. Reconstructed images demonstrate no additional findings. Review of the MIP images confirms the above findings. IMPRESSION: Chest: 1. No evidence of pulmonary embolus. 2. Extensive lymphadenopathy within the neck, axillary regions, mediastinum, and hila. Similar appearance to recent PET scan 08/07/2023, with progression since prior chest CT 12/28/2022. 3. New left upper lobe pulmonary nodule since the prior chest CT, corresponding to metabolically active lesions on recent PET scan, consistent with metastatic disease. 4.  Aortic Atherosclerosis (ICD10-I70.0). Abdomen/pelvis: 1. Diffuse lymphadenopathy throughout the abdomen and pelvis, minimally decreased in size since the abdominal CT 07/31/2023. No new adenopathy is identified. 2. Trace free fluid within the bilateral paracolic gutters, nonspecific. 3. No other acute intra-abdominal or  intrapelvic process. Electronically Signed   By: Sharlet Salina M.D.   On: 08/24/2023 11:06   CT ABDOMEN PELVIS W CONTRAST Result Date: 08/24/2023 CLINICAL DATA:  Increasing weakness since Thursday, history of lymphoma EXAM: CT ANGIOGRAPHY CHEST CT ABDOMEN AND PELVIS WITH CONTRAST TECHNIQUE: Multidetector CT imaging of the chest was performed using the standard protocol during bolus administration of intravenous contrast. Multiplanar CT image reconstructions and MIPs were obtained to evaluate the vascular anatomy. Multidetector CT imaging of the abdomen and pelvis was performed using the standard protocol during bolus administration of intravenous contrast. RADIATION DOSE REDUCTION: This exam was performed according to the departmental dose-optimization program which includes automated exposure control, adjustment of the mA and/or kV according to patient size and/or use of iterative reconstruction technique. CONTRAST:  80mL OMNIPAQUE IOHEXOL 350 MG/ML SOLN COMPARISON:  08/07/2023, 07/31/2023, 05/09/2023 FINDINGS: CTA CHEST FINDINGS Cardiovascular: This is a technically adequate evaluation of the pulmonary vasculature. No filling defects or pulmonary emboli. The heart is unremarkable without pericardial effusion. Ectasia of the thoracic aorta without evidence of aneurysm or dissection. Atherosclerosis of the aortic arch. Mediastinum/Nodes: Extensive lymphadenopathy is identified, similar in appearance to recent PET scan. Index lymph nodes are  as follows: Right cervical chain, 2.9 cm, image 4/4. Left axilla, 1.2 cm short axis, image 50/4 AP window, 1.3 cm short axis, image 54/4. Left hilum, 1.6 cm short axis, image 42/4. Left internal mammary chain, 1.2 cm short axis, image 55/4. The lymphadenopathy has progressed since the 12/28/2022 chest CT, but is similar in appearance to recent PET scan 08/07/2023. Thyroid, trachea, and esophagus are unremarkable. Lungs/Pleura: There are 2 new left upper lobe pulmonary  nodules. On image 20/12 there is an 8 mm nodule, which is metabolically active on recent PET scan. On image 55/12 there is a 7 mm nodule, with faint low level activity on recent PET scan. Findings are suspicious for metastatic disease. Hypoventilatory changes are seen at the lung bases. Curvilinear areas of subpleural consolidation at the lung apices may reflect atelectasis. No effusion or pneumothorax. Musculoskeletal: 9 mm subcutaneous nodule in the posterior left shoulder image 43/4 corresponds to a metabolically active lesion on PET scan, and is new since prior CT. No acute or destructive bony abnormalities. Severe degenerative changes of the left shoulder. Unremarkable right shoulder arthroplasty. Reconstructed images demonstrate no additional findings. Review of the MIP images confirms the above findings. CT ABDOMEN and PELVIS FINDINGS Hepatobiliary: No focal liver abnormality is seen. Status post cholecystectomy. No biliary dilatation. Pancreas: Unremarkable. No pancreatic ductal dilatation or surrounding inflammatory changes. Spleen: Normal in size without focal abnormality. Adrenals/Urinary Tract: Stable appearance of the adrenals and kidneys. No urinary tract calculi or obstruction. Bladder is unremarkable. Stomach/Bowel: No bowel obstruction or ileus. Scattered colonic diverticulosis without diverticulitis. No bowel wall thickening or inflammatory change. Vascular/Lymphatic: Persistent lymphadenopathy throughout the abdomen and pelvis. Index lymph nodes are as follows: Right retrocrural, image 24/2, 12 mm.  Previously 16 mm. Porta hepatis, image 33/2, 13 mm.  No change. Retroperitoneal lymph node mass, image 50/2, 78 x 30 mm, previously 89 x 31 mm. Central mesenteric, image 59/2, 9 mm.  Previously 14 mm. Right inguinal, image 90/2, 11 mm.  Previously 15 mm. Stable aortic atherosclerosis. There is extrinsic compression upon the IVC and bilateral common iliac veins due to the retroperitoneal lymph node  mass, stable since prior study. Reproductive: Status post hysterectomy. No adnexal masses. Other: Trace free fluid within the paracolic gutters. No free intraperitoneal gas. Small fat containing supraumbilical ventral hernias are stable. Musculoskeletal: No acute or destructive bony abnormalities. Reconstructed images demonstrate no additional findings. Review of the MIP images confirms the above findings. IMPRESSION: Chest: 1. No evidence of pulmonary embolus. 2. Extensive lymphadenopathy within the neck, axillary regions, mediastinum, and hila. Similar appearance to recent PET scan 08/07/2023, with progression since prior chest CT 12/28/2022. 3. New left upper lobe pulmonary nodule since the prior chest CT, corresponding to metabolically active lesions on recent PET scan, consistent with metastatic disease. 4.  Aortic Atherosclerosis (ICD10-I70.0). Abdomen/pelvis: 1. Diffuse lymphadenopathy throughout the abdomen and pelvis, minimally decreased in size since the abdominal CT 07/31/2023. No new adenopathy is identified. 2. Trace free fluid within the bilateral paracolic gutters, nonspecific. 3. No other acute intra-abdominal or intrapelvic process. Electronically Signed   By: Sharlet Salina M.D.   On: 08/24/2023 11:06   DG Chest Portable 1 View Result Date: 08/24/2023 CLINICAL DATA:  72 year old female with weakness.  Lymphoma. EXAM: PORTABLE CHEST 1 VIEW COMPARISON:  Portable chest 03/28/2023.  PET-CT 08/07/2023. FINDINGS: Portable AP semi upright view at 0847 hours. Right chest power port remains. Increased mediastinal contour compatible with mediastinal lymphadenopathy in this setting. Visualized tracheal air column is within normal limits.  Low lung volumes. But Allowing for portable technique the lungs are clear. No pneumothorax or pleural effusion. Paucity bowel gas. No acute osseous abnormality identified. Chronic chest wall surgical clips. Chronic right shoulder arthroplasty. IMPRESSION: 1. Evidence of  progressed mediastinal lymphadenopathy since July. 2. Low lung volumes, otherwise no acute cardiopulmonary abnormality. Electronically Signed   By: Odessa Fleming M.D.   On: 08/24/2023 09:00   CT Head Wo Contrast Result Date: 08/24/2023 CLINICAL DATA:  72 year old female with altered mental status. Increasing weakness. Lymphoma. EXAM: CT HEAD WITHOUT CONTRAST TECHNIQUE: Contiguous axial images were obtained from the base of the skull through the vertex without intravenous contrast. RADIATION DOSE REDUCTION: This exam was performed according to the departmental dose-optimization program which includes automated exposure control, adjustment of the mA and/or kV according to patient size and/or use of iterative reconstruction technique. COMPARISON:  Brain MRI 10/27/2021.  Head CT 03/28/2023. FINDINGS: Brain: Cerebral volume remains normal for age. No midline shift, ventriculomegaly, mass effect, evidence of mass lesion, intracranial hemorrhage or evidence of cortically based acute infarction. Gray-white differentiation stable and within normal limits for age. Vascular: No suspicious intracranial vascular hyperdensity. Skull: Bilateral TMJ degeneration. No acute or suspicious osseous lesion. Sinuses/Orbits: Visualized paranasal sinuses and mastoids are stable and well aerated. Other: Visualized orbits and scalp soft tissues are within normal limits. IMPRESSION: Stable and normal for age noncontrast CT appearance of the Brain. Electronically Signed   By: Odessa Fleming M.D.   On: 08/24/2023 08:56     The results of significant diagnostics from this hospitalization (including imaging, microbiology, ancillary and laboratory) are listed below for reference.     Microbiology: No results found for this or any previous visit (from the past 240 hours).   Labs: BNP (last 3 results) No results for input(s): "BNP" in the last 8760 hours. Basic Metabolic Panel: Recent Labs  Lab 09/04/23 0325 09/05/23 0341 09/06/23 0305  09/07/23 0306 09/08/23 0223  NA 143 137 136 139 141  K 4.3 3.9 3.4* 3.8 3.7  CL 114* 110 109 110 110  CO2 19* 19* 19* 21* 22  GLUCOSE 135* 132* 128* 103* 171*  BUN 37* 36* 34* 31* 31*  CREATININE 1.21* 1.25* 1.08* 1.14* 1.12*  CALCIUM 6.3* 5.9* 6.0* 6.2* 5.9*  MG 1.7 1.9 1.8 2.1 1.8  PHOS 3.1 2.7 2.5 1.7* 2.8   Liver Function Tests: Recent Labs  Lab 09/04/23 0325 09/05/23 0341 09/06/23 0305 09/07/23 0306 09/08/23 0223  AST 51* 54* 50* 45* 47*  ALT 47* 50* 52* 52* 49*  ALKPHOS 188* 191* 180* 171* 170*  BILITOT 2.2* 2.0* 2.2* 1.7* 2.0*  PROT 4.4* 4.4* 4.5* 4.6* 4.1*  ALBUMIN 2.2* 2.1* 2.2* 2.1* 1.9*   No results for input(s): "LIPASE", "AMYLASE" in the last 168 hours. No results for input(s): "AMMONIA" in the last 168 hours. CBC: Recent Labs  Lab 09/04/23 0325 09/05/23 0341 09/06/23 0305 09/07/23 0306 09/08/23 0223  WBC 1.1* 0.9* 0.8* 0.9* 0.5*  NEUTROABS 0.7* 0.5* 0.5* 0.4* 0.2*  HGB 9.3* 9.2* 9.3* 9.9* 8.9*  HCT 28.4* 27.6* 28.4* 30.3* 26.8*  MCV 90.2 90.8 91.0 91.0 90.5  PLT 14* 9* 17* 11* 16*   Cardiac Enzymes: No results for input(s): "CKTOTAL", "CKMB", "CKMBINDEX", "TROPONINI" in the last 168 hours. BNP: Invalid input(s): "POCBNP" CBG: Recent Labs  Lab 09/08/23 2023 09/09/23 0003 09/09/23 0432 09/09/23 0725 09/09/23 1145  GLUCAP 108* 97 100* 99 96   D-Dimer No results for input(s): "DDIMER" in the last 72 hours. Hgb A1c  No results for input(s): "HGBA1C" in the last 72 hours. Lipid Profile No results for input(s): "CHOL", "HDL", "LDLCALC", "TRIG", "CHOLHDL", "LDLDIRECT" in the last 72 hours. Thyroid function studies No results for input(s): "TSH", "T4TOTAL", "T3FREE", "THYROIDAB" in the last 72 hours.  Invalid input(s): "FREET3" Anemia work up No results for input(s): "VITAMINB12", "FOLATE", "FERRITIN", "TIBC", "IRON", "RETICCTPCT" in the last 72 hours. Urinalysis    Component Value Date/Time   COLORURINE AMBER (A) 08/01/2023 2015    APPEARANCEUR HAZY (A) 08/01/2023 2015   LABSPEC 1.020 08/01/2023 2015   LABSPEC 1.015 03/11/2017 1600   PHURINE 5.0 08/01/2023 2015   GLUCOSEU NEGATIVE 08/01/2023 2015   GLUCOSEU Negative 03/11/2017 1600   HGBUR SMALL (A) 08/01/2023 2015   BILIRUBINUR NEGATIVE 08/01/2023 2015   BILIRUBINUR Negative 03/11/2017 1600   KETONESUR NEGATIVE 08/01/2023 2015   PROTEINUR NEGATIVE 08/01/2023 2015   UROBILINOGEN 0.2 03/11/2017 1600   NITRITE NEGATIVE 08/01/2023 2015   LEUKOCYTESUR MODERATE (A) 08/01/2023 2015   LEUKOCYTESUR Small 03/11/2017 1600   Sepsis Labs Recent Labs  Lab 09/05/23 0341 09/06/23 0305 09/07/23 0306 09/08/23 0223  WBC 0.9* 0.8* 0.9* 0.5*   Microbiology No results found for this or any previous visit (from the past 240 hours).   Time coordinating discharge:  I have spent 35 minutes face to face with the patient and on the ward discussing the patients care, assessment, plan and disposition with other care givers. >50% of the time was devoted counseling the patient about the risks and benefits of treatment/Discharge disposition and coordinating care.   SIGNED:   Miguel Rota, MD  [facility] 09/10/2023, 12:11 PM   If 7PM-7AM, please contact night-coverage

## 2023-09-10 NOTE — Care Management Important Message (Signed)
Important Message  Patient Details IM Letter given Name: Cynthia Velasquez MRN: [id number] Date of Birth: [birth year]   Important Message Given:  Yes - Medicare IM     Caren Macadam 09/10/2023, 9:30 AM

## 2023-09-10 NOTE — TOC Transition Note (Signed)
Transition of Care [facility]) - Discharge Note  Patient Details  Name: Cynthia Velasquez MRN: [id number] Date of Birth: [birth year]  Transition of Care [facility]) CM/SW Contact:  Ewing Schlein, LCSW Phone Number: 09/10/2023, 10:39 AM  Clinical Narrative: Patient will discharge home with hospice through [facility]. CSW confirmed patient's discharge address with daughter, Cynthia Velasquez. Medical necessity form done; PTAR scheduled. Discharge packet completed. RN updated. TOC signing off.    Final next level of care: Home w Hospice Care Barriers to Discharge: Barriers Resolved  Patient Goals and CMS Choice Patient states their goals for this hospitalization and ongoing recovery are:: For pt to return home  Discharge Placement Patient to be transferred to facility by: PTAR Name of family member notified: Cynthia Velasquez (daughter) Patient and family notified of of transfer: 09/10/23  Discharge Plan and Services Additional resources added to the After Visit Summary for   In-house Referral: NA Discharge Planning Services: NA Post Acute Care Choice:  (Unknown)          DME Arranged: N/A DME Agency: NA  Social Drivers of Health (SDOH) Interventions SDOH Screenings   Food Insecurity: No Food Insecurity (08/24/2023)  Housing: Low Risk  (08/28/2023)  Recent Concern: Housing - Medium Risk (08/24/2023)  Transportation Needs: No Transportation Needs (08/24/2023)  Utilities: Not At Risk (08/24/2023)  Social Connections: Unknown (01/29/2022)   [facility]  Tobacco Use: Low Risk  (08/24/2023)   Readmission Risk Interventions    09/04/2023   12:58 PM 08/25/2023    1:23 PM 08/01/2023   12:15 PM  Readmission Risk Prevention Plan  Transportation Screening Complete Complete Complete  PCP or Specialist Appt within 5-7 Days   Complete  PCP or Specialist Appt within 3-5 Days  Complete   Home Care Screening   Complete  Medication Review (RN CM)   Complete  HRI or Home Care Consult   Complete   Social Work Consult for Recovery Care Planning/Counseling  Complete   Palliative Care Screening  Complete   Medication Review [employer]) Complete Complete   PCP or Specialist appointment within 3-5 days of discharge Complete    HRI or Home Care Consult Complete    SW Recovery Care/Counseling Consult Complete    Palliative Care Screening Complete    Skilled Nursing Facility Complete

## 2023-09-10 NOTE — Progress Notes (Signed)
Discharge instructions reviewed with patient and patients daughter. All questions answered. All belongings accounted for. Patient medications hand delivered from pharmacy secure bag.   PTAR scheduled and waiting for pickup

## 2023-09-11 NOTE — Consult Note (Signed)
Value-Based Care [facility] Liaison Consult Note    09/11/2023  Cynthia Velasquez 05-25-1951 [id number]  Updated:  Home with Hospice with [facility] noted.   Plan: Updated [facility] transition team of disposition.  Charlesetta Shanks, RN, BSN, CCM [employer], [facility] Liaison Direct Dial: [phone] or secure chat Email: [name]

## 2023-09-23 NOTE — Telephone Encounter (Signed)
 Connected with [employer], [employer]. regarding claim and status of request not yet received.   We sent request 09/02/2023.  System will not allow changing addressee to [facility]. Will connect with staff managing claim o notify of request for H.I.M. to comply.  Message left for [facility], [phone] (home) to call this nurse to discuss records and need for updated authorization form.

## 2023-09-24 NOTE — Telephone Encounter (Signed)
 No return cal from Orlan Leavens or fax from [employer].  Faxed request to (SW) H.I.M to process record request from 04/17/2023 to present.  No further instructions received, actions performed or required by this nurse.

## 2023-10-18 DEATH — deceased

## 2023-11-04 ENCOUNTER — Other Ambulatory Visit (HOSPITAL_COMMUNITY): Payer: Self-pay

## 2024-08-19 ENCOUNTER — Encounter: Payer: Self-pay | Admitting: Hematology

## 2024-09-17 ENCOUNTER — Encounter: Payer: Self-pay | Admitting: Hematology
